# Patient Record
Sex: Female | Born: 1970
Health system: Southern US, Community
[De-identification: ages and names within clinical notes are randomized; demographics above are authoritative.]

## PROBLEM LIST (undated history)

## (undated) DIAGNOSIS — I10 Essential (primary) hypertension: Secondary | ICD-10-CM

## (undated) DIAGNOSIS — I639 Cerebral infarction, unspecified: Secondary | ICD-10-CM

## (undated) HISTORY — PX: TUBAL LIGATION: SHX77

---

## 1998-05-26 ENCOUNTER — Ambulatory Visit (HOSPITAL_COMMUNITY): Admission: RE | Admit: 1998-05-26 | Discharge: 1998-05-26 | Payer: Self-pay | Admitting: *Deleted

## 1999-04-20 ENCOUNTER — Encounter: Admission: RE | Admit: 1999-04-20 | Discharge: 1999-04-20 | Payer: Self-pay | Admitting: Family Medicine

## 1999-04-20 ENCOUNTER — Other Ambulatory Visit: Admission: RE | Admit: 1999-04-20 | Discharge: 1999-04-20 | Payer: Self-pay | Admitting: *Deleted

## 1999-09-05 ENCOUNTER — Inpatient Hospital Stay (HOSPITAL_COMMUNITY): Admission: AD | Admit: 1999-09-05 | Discharge: 1999-09-05 | Payer: Self-pay | Admitting: Obstetrics & Gynecology

## 1999-10-05 ENCOUNTER — Encounter: Admission: RE | Admit: 1999-10-05 | Discharge: 1999-10-05 | Payer: Self-pay | Admitting: Family Medicine

## 1999-10-06 ENCOUNTER — Inpatient Hospital Stay (HOSPITAL_COMMUNITY): Admission: AD | Admit: 1999-10-06 | Discharge: 1999-10-06 | Payer: Self-pay | Admitting: Emergency Medicine

## 1999-10-11 ENCOUNTER — Encounter: Admission: RE | Admit: 1999-10-11 | Discharge: 1999-10-11 | Payer: Self-pay | Admitting: Sports Medicine

## 1999-11-29 ENCOUNTER — Encounter: Admission: RE | Admit: 1999-11-29 | Discharge: 1999-11-29 | Payer: Self-pay | Admitting: Sports Medicine

## 1999-12-21 ENCOUNTER — Ambulatory Visit (HOSPITAL_COMMUNITY): Admission: RE | Admit: 1999-12-21 | Discharge: 1999-12-21 | Payer: Self-pay | Admitting: *Deleted

## 1999-12-27 ENCOUNTER — Encounter: Admission: RE | Admit: 1999-12-27 | Discharge: 1999-12-27 | Payer: Self-pay | Admitting: Sports Medicine

## 2000-01-10 ENCOUNTER — Encounter: Admission: RE | Admit: 2000-01-10 | Discharge: 2000-01-10 | Payer: Self-pay | Admitting: Sports Medicine

## 2000-01-16 ENCOUNTER — Encounter: Admission: RE | Admit: 2000-01-16 | Discharge: 2000-01-16 | Payer: Self-pay | Admitting: Family Medicine

## 2000-01-25 ENCOUNTER — Encounter: Admission: RE | Admit: 2000-01-25 | Discharge: 2000-01-25 | Payer: Self-pay | Admitting: Family Medicine

## 2000-02-10 ENCOUNTER — Encounter: Admission: RE | Admit: 2000-02-10 | Discharge: 2000-02-10 | Payer: Self-pay | Admitting: Family Medicine

## 2000-02-23 ENCOUNTER — Encounter: Admission: RE | Admit: 2000-02-23 | Discharge: 2000-02-23 | Payer: Self-pay | Admitting: Family Medicine

## 2000-03-15 ENCOUNTER — Encounter: Admission: RE | Admit: 2000-03-15 | Discharge: 2000-03-15 | Payer: Self-pay | Admitting: Family Medicine

## 2000-03-21 ENCOUNTER — Inpatient Hospital Stay (HOSPITAL_COMMUNITY): Admission: AD | Admit: 2000-03-21 | Discharge: 2000-03-21 | Payer: Self-pay | Admitting: Obstetrics

## 2000-03-23 ENCOUNTER — Encounter: Admission: RE | Admit: 2000-03-23 | Discharge: 2000-03-23 | Payer: Self-pay | Admitting: Family Medicine

## 2000-03-26 ENCOUNTER — Encounter: Admission: RE | Admit: 2000-03-26 | Discharge: 2000-03-26 | Payer: Self-pay | Admitting: Family Medicine

## 2000-03-31 ENCOUNTER — Inpatient Hospital Stay (HOSPITAL_COMMUNITY): Admission: AD | Admit: 2000-03-31 | Discharge: 2000-03-31 | Payer: Self-pay | Admitting: *Deleted

## 2000-04-02 ENCOUNTER — Encounter (HOSPITAL_COMMUNITY): Admission: AD | Admit: 2000-04-02 | Discharge: 2000-04-06 | Payer: Self-pay | Admitting: Obstetrics

## 2000-04-05 ENCOUNTER — Inpatient Hospital Stay (HOSPITAL_COMMUNITY): Admission: AD | Admit: 2000-04-05 | Discharge: 2000-04-08 | Payer: Self-pay | Admitting: Obstetrics & Gynecology

## 2000-04-05 ENCOUNTER — Encounter: Admission: RE | Admit: 2000-04-05 | Discharge: 2000-04-05 | Payer: Self-pay | Admitting: Family Medicine

## 2000-07-23 ENCOUNTER — Encounter: Admission: RE | Admit: 2000-07-23 | Discharge: 2000-07-23 | Payer: Self-pay | Admitting: Family Medicine

## 2001-03-06 ENCOUNTER — Ambulatory Visit (HOSPITAL_BASED_OUTPATIENT_CLINIC_OR_DEPARTMENT_OTHER): Admission: RE | Admit: 2001-03-06 | Discharge: 2001-03-06 | Payer: Self-pay | Admitting: Ophthalmology

## 2001-12-03 ENCOUNTER — Encounter: Admission: RE | Admit: 2001-12-03 | Discharge: 2001-12-03 | Payer: Self-pay | Admitting: Family Medicine

## 2001-12-06 ENCOUNTER — Encounter: Admission: RE | Admit: 2001-12-06 | Discharge: 2001-12-06 | Payer: Self-pay | Admitting: Family Medicine

## 2002-07-04 ENCOUNTER — Encounter: Admission: RE | Admit: 2002-07-04 | Discharge: 2002-07-04 | Payer: Self-pay | Admitting: Family Medicine

## 2003-02-16 ENCOUNTER — Encounter (INDEPENDENT_AMBULATORY_CARE_PROVIDER_SITE_OTHER): Payer: Self-pay | Admitting: *Deleted

## 2003-02-16 LAB — CONVERTED CEMR LAB

## 2003-03-11 ENCOUNTER — Other Ambulatory Visit: Admission: RE | Admit: 2003-03-11 | Discharge: 2003-03-11 | Payer: Self-pay | Admitting: Family Medicine

## 2003-03-11 ENCOUNTER — Encounter: Admission: RE | Admit: 2003-03-11 | Discharge: 2003-03-11 | Payer: Self-pay | Admitting: Sports Medicine

## 2007-02-14 DIAGNOSIS — L708 Other acne: Secondary | ICD-10-CM | POA: Insufficient documentation

## 2007-02-15 ENCOUNTER — Encounter (INDEPENDENT_AMBULATORY_CARE_PROVIDER_SITE_OTHER): Payer: Self-pay | Admitting: *Deleted

## 2011-11-28 ENCOUNTER — Encounter: Payer: Self-pay | Admitting: Family Medicine

## 2011-12-26 ENCOUNTER — Encounter: Payer: Self-pay | Admitting: Family Medicine

## 2012-02-09 ENCOUNTER — Emergency Department (HOSPITAL_COMMUNITY): Payer: Medicaid Other

## 2012-02-09 ENCOUNTER — Encounter (HOSPITAL_COMMUNITY): Payer: Self-pay | Admitting: *Deleted

## 2012-02-09 ENCOUNTER — Inpatient Hospital Stay (HOSPITAL_COMMUNITY)
Admission: EM | Admit: 2012-02-09 | Discharge: 2012-02-11 | DRG: 512 | Disposition: A | Discharge status: Home / Self Care | Payer: Medicaid Other | Attending: Orthopedic Surgery | Admitting: Orthopedic Surgery

## 2012-02-09 DIAGNOSIS — S52309A Unspecified fracture of shaft of unspecified radius, initial encounter for closed fracture: Principal | ICD-10-CM | POA: Diagnosis present

## 2012-02-09 DIAGNOSIS — E876 Hypokalemia: Secondary | ICD-10-CM

## 2012-02-09 DIAGNOSIS — S52201A Unspecified fracture of shaft of right ulna, initial encounter for closed fracture: Secondary | ICD-10-CM

## 2012-02-09 DIAGNOSIS — S5290XA Unspecified fracture of unspecified forearm, initial encounter for closed fracture: Secondary | ICD-10-CM

## 2012-02-09 DIAGNOSIS — D649 Anemia, unspecified: Secondary | ICD-10-CM

## 2012-02-09 DIAGNOSIS — S52209A Unspecified fracture of shaft of unspecified ulna, initial encounter for closed fracture: Principal | ICD-10-CM

## 2012-02-09 DIAGNOSIS — I10 Essential (primary) hypertension: Secondary | ICD-10-CM | POA: Diagnosis present

## 2012-02-09 DIAGNOSIS — Y92410 Unspecified street and highway as the place of occurrence of the external cause: Secondary | ICD-10-CM

## 2012-02-09 HISTORY — DX: Essential (primary) hypertension: I10

## 2012-02-09 LAB — DIFFERENTIAL
Basophils Absolute: 0 10*3/uL (ref 0.0–0.1)
Eosinophils Relative: 1 % (ref 0–5)
Lymphocytes Relative: 26 % (ref 12–46)
Lymphs Abs: 2.8 10*3/uL (ref 0.7–4.0)
Monocytes Relative: 5 % (ref 3–12)
Neutrophils Relative %: 68 % (ref 43–77)

## 2012-02-09 LAB — BASIC METABOLIC PANEL
CO2: 21 mEq/L (ref 19–32)
Chloride: 101 mEq/L (ref 96–112)
GFR calc Af Amer: >90 mL/min (ref >90)
Potassium: 3.1 mEq/L — ABNORMAL LOW (ref 3.5–5.1)
Sodium: 136 mEq/L (ref 135–145)

## 2012-02-09 LAB — CBC
MCV: 64 fL — ABNORMAL LOW (ref 78.0–100.0)
Platelets: 244 10*3/uL (ref 150–400)
RBC: 4.64 MIL/uL (ref 3.87–5.11)
WBC: 10.7 10*3/uL — ABNORMAL HIGH (ref 4.0–10.5)

## 2012-02-09 MED ORDER — MORPHINE SULFATE 4 MG/ML IJ SOLN
4.0000 mg | Freq: Once | INTRAMUSCULAR | Status: AC
  Administered 2012-02-09: 4 mg via INTRAVENOUS
  Filled 2012-02-09: qty 1
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
## 2012-02-10 ENCOUNTER — Emergency Department (HOSPITAL_COMMUNITY): Payer: Medicaid Other

## 2012-02-10 ENCOUNTER — Encounter (HOSPITAL_COMMUNITY): Payer: Self-pay | Admitting: Anesthesiology

## 2012-02-10 ENCOUNTER — Encounter (HOSPITAL_COMMUNITY): Payer: Self-pay | Admitting: Radiology

## 2012-02-10 ENCOUNTER — Encounter (HOSPITAL_COMMUNITY)
Admission: EM | Disposition: A | Discharge status: Home / Self Care | Payer: Self-pay | Source: Home / Self Care | Attending: Orthopedic Surgery

## 2012-02-10 ENCOUNTER — Emergency Department (HOSPITAL_COMMUNITY): Payer: Medicaid Other | Admitting: Anesthesiology

## 2012-02-10 DIAGNOSIS — S52209A Unspecified fracture of shaft of unspecified ulna, initial encounter for closed fracture: Secondary | ICD-10-CM

## 2012-02-10 HISTORY — PX: ORIF ULNAR FRACTURE: SHX5417

## 2012-02-10 HISTORY — PX: ORIF RADIAL FRACTURE: SHX5113

## 2012-02-10 LAB — ABO/RH: ABO/RH(D): A POS

## 2012-02-10 LAB — TYPE AND SCREEN
ABO/RH(D): A POS
Antibody Screen: NEGATIVE

## 2012-02-10 SURGERY — OPEN REDUCTION INTERNAL FIXATION (ORIF) RADIAL FRACTURE
Anesthesia: General | Site: Arm Lower | Laterality: Right | Wound class: Clean

## 2012-02-10 MED ORDER — LACTATED RINGERS IV SOLN
INTRAVENOUS | Status: DC | PRN
  Administered 2012-02-10 (×2): via INTRAVENOUS

## 2012-02-10 MED ORDER — MORPHINE SULFATE 4 MG/ML IJ SOLN
4.0000 mg | INTRAMUSCULAR | Status: DC | PRN
  Administered 2012-02-10 (×2): 4 mg via INTRAVENOUS
  Filled 2012-02-10 (×2): qty 1

## 2012-02-10 MED ORDER — METOCLOPRAMIDE HCL 10 MG PO TABS: 5.0000 mg | ORAL_TABLET | Freq: Three times a day (TID) | ORAL | Status: DC | PRN

## 2012-02-10 MED ORDER — ONDANSETRON HCL 4 MG/2ML IJ SOLN: 4.0000 mg | Freq: Four times a day (QID) | INTRAMUSCULAR | Status: DC | PRN

## 2012-02-10 MED ORDER — KETOROLAC TROMETHAMINE 30 MG/ML IJ SOLN
30.0000 mg | Freq: Four times a day (QID) | INTRAMUSCULAR | Status: DC
  Administered 2012-02-10 – 2012-02-11 (×3): 30 mg via INTRAVENOUS
  Filled 2012-02-10 (×7): qty 1

## 2012-02-10 MED ORDER — CEFAZOLIN SODIUM 1-5 GM-% IV SOLN
1.0000 g | Freq: Three times a day (TID) | INTRAVENOUS | Status: AC
  Administered 2012-02-10 – 2012-02-11 (×3): 1 g via INTRAVENOUS
  Filled 2012-02-10 (×3): qty 50

## 2012-02-10 MED ORDER — FENTANYL CITRATE 0.05 MG/ML IJ SOLN
INTRAMUSCULAR | Status: DC | PRN
  Administered 2012-02-10: 100 ug via INTRAVENOUS
  Administered 2012-02-10: 5 ug via INTRAVENOUS

## 2012-02-10 MED ORDER — NEOSTIGMINE METHYLSULFATE 1 MG/ML IJ SOLN
INTRAMUSCULAR | Status: DC | PRN
  Administered 2012-02-10: 3 mg via INTRAVENOUS

## 2012-02-10 MED ORDER — CHLORHEXIDINE GLUCONATE 4 % EX LIQD
60.0000 mL | Freq: Once | CUTANEOUS | Status: DC
  Filled 2012-02-10: qty 60

## 2012-02-10 MED ORDER — MORPHINE SULFATE 2 MG/ML IJ SOLN: 0.0500 mg/kg | INTRAMUSCULAR | Status: DC | PRN

## 2012-02-10 MED ORDER — METHOCARBAMOL 500 MG PO TABS
500.0000 mg | ORAL_TABLET | Freq: Four times a day (QID) | ORAL | Status: DC | PRN
  Administered 2012-02-11: 500 mg via ORAL
  Filled 2012-02-10: qty 1

## 2012-02-10 MED ORDER — MIDAZOLAM HCL 5 MG/5ML IJ SOLN
INTRAMUSCULAR | Status: DC | PRN
  Administered 2012-02-10 (×2): 1 mg via INTRAVENOUS

## 2012-02-10 MED ORDER — ONDANSETRON HCL 4 MG/2ML IJ SOLN
4.0000 mg | Freq: Four times a day (QID) | INTRAMUSCULAR | Status: DC | PRN
  Administered 2012-02-10: 4 mg via INTRAVENOUS
  Filled 2012-02-10: qty 2

## 2012-02-10 MED ORDER — ROCURONIUM BROMIDE 100 MG/10ML IV SOLN
INTRAVENOUS | Status: DC | PRN
  Administered 2012-02-10: 50 mg via INTRAVENOUS

## 2012-02-10 MED ORDER — MEPERIDINE HCL 25 MG/ML IJ SOLN: 6.2500 mg | INTRAMUSCULAR | Status: DC | PRN

## 2012-02-10 MED ORDER — MORPHINE SULFATE 4 MG/ML IJ SOLN
4.0000 mg | Freq: Once | INTRAMUSCULAR | Status: AC
  Administered 2012-02-10: 4 mg via INTRAVENOUS
  Filled 2012-02-10: qty 1

## 2012-02-10 MED ORDER — METOCLOPRAMIDE HCL 5 MG/ML IJ SOLN
5.0000 mg | Freq: Three times a day (TID) | INTRAMUSCULAR | Status: DC | PRN
  Filled 2012-02-10: qty 2

## 2012-02-10 MED ORDER — ONDANSETRON HCL 4 MG/2ML IJ SOLN: 4.0000 mg | Freq: Once | INTRAMUSCULAR | Status: DC | PRN

## 2012-02-10 MED ORDER — MORPHINE SULFATE 4 MG/ML IJ SOLN
4.0000 mg | INTRAMUSCULAR | Status: DC | PRN
  Administered 2012-02-10: 4 mg via INTRAVENOUS

## 2012-02-10 MED ORDER — CEFAZOLIN SODIUM-DEXTROSE 2-3 GM-% IV SOLR: 2.0000 g | INTRAVENOUS | Status: DC

## 2012-02-10 MED ORDER — IOHEXOL 300 MG/ML  SOLN
100.0000 mL | Freq: Once | INTRAMUSCULAR | Status: AC | PRN
  Administered 2012-02-10: 100 mL via INTRAVENOUS

## 2012-02-10 MED ORDER — HYDROMORPHONE HCL PF 1 MG/ML IJ SOLN
1.0000 mg | Freq: Once | INTRAMUSCULAR | Status: AC
  Administered 2012-02-10: 1 mg via INTRAVENOUS
  Filled 2012-02-10: qty 1

## 2012-02-10 MED ORDER — HYDROMORPHONE HCL PF 1 MG/ML IJ SOLN: 0.2500 mg | INTRAMUSCULAR | Status: DC | PRN

## 2012-02-10 MED ORDER — ONDANSETRON HCL 4 MG PO TABS: 4.0000 mg | ORAL_TABLET | Freq: Four times a day (QID) | ORAL | Status: DC | PRN

## 2012-02-10 MED ORDER — PHENYLEPHRINE HCL 10 MG/ML IJ SOLN
INTRAMUSCULAR | Status: DC | PRN
  Administered 2012-02-10 (×2): 80 ug via INTRAVENOUS

## 2012-02-10 MED ORDER — PROPOFOL 10 MG/ML IV EMUL
INTRAVENOUS | Status: DC | PRN
  Administered 2012-02-10: 120 mg via INTRAVENOUS

## 2012-02-10 MED ORDER — 0.9 % SODIUM CHLORIDE (POUR BTL) OPTIME
TOPICAL | Status: DC | PRN
  Administered 2012-02-10: 1000 mL

## 2012-02-10 MED ORDER — ONDANSETRON HCL 4 MG/2ML IJ SOLN
INTRAMUSCULAR | Status: DC | PRN
  Administered 2012-02-10: 4 mg via INTRAVENOUS

## 2012-02-10 MED ORDER — CEFAZOLIN SODIUM 1-5 GM-% IV SOLN
INTRAVENOUS | Status: DC | PRN
  Administered 2012-02-10: 2 g via INTRAVENOUS

## 2012-02-10 MED ORDER — KCL IN DEXTROSE-NACL 20-5-0.45 MEQ/L-%-% IV SOLN
INTRAVENOUS | Status: AC
  Administered 2012-02-10: 02:00:00 via INTRAVENOUS
  Filled 2012-02-10: qty 1000

## 2012-02-10 MED ORDER — MORPHINE SULFATE 4 MG/ML IJ SOLN
4.0000 mg | INTRAMUSCULAR | Status: DC | PRN
  Filled 2012-02-10: qty 1

## 2012-02-10 MED ORDER — LIDOCAINE HCL (CARDIAC) 20 MG/ML IV SOLN
INTRAVENOUS | Status: DC | PRN
  Administered 2012-02-10: 60 mg via INTRAVENOUS

## 2012-02-10 MED ORDER — BUPIVACAINE-EPINEPHRINE PF 0.5-1:200000 % IJ SOLN
INTRAMUSCULAR | Status: DC | PRN
  Administered 2012-02-10: 30 mL

## 2012-02-10 MED ORDER — GLYCOPYRROLATE 0.2 MG/ML IJ SOLN
INTRAMUSCULAR | Status: DC | PRN
  Administered 2012-02-10: .4 mg via INTRAVENOUS

## 2012-02-10 MED ORDER — METHOCARBAMOL 100 MG/ML IJ SOLN
500.0000 mg | Freq: Four times a day (QID) | INTRAMUSCULAR | Status: DC | PRN
  Filled 2012-02-10: qty 5

## 2012-02-10 MED ORDER — SUCCINYLCHOLINE CHLORIDE 20 MG/ML IJ SOLN
INTRAMUSCULAR | Status: DC | PRN
  Administered 2012-02-10: 100 mg via INTRAVENOUS

## 2012-02-10 MED ORDER — HYDROMORPHONE HCL PF 1 MG/ML IJ SOLN: 0.5000 mg | INTRAMUSCULAR | Status: DC | PRN

## 2012-02-10 MED ORDER — OXYCODONE HCL 5 MG PO TABS
5.0000 mg | ORAL_TABLET | ORAL | Status: DC | PRN
  Administered 2012-02-11: 10 mg via ORAL
  Filled 2012-02-10: qty 2

## 2012-02-10 SURGICAL SUPPLY — 67 items
BANDAGE ACE 4 STERILE (GAUZE/BANDAGES/DRESSINGS) ×2 IMPLANT
BANDAGE ELASTIC 3 VELCRO ST LF (GAUZE/BANDAGES/DRESSINGS) ×2 IMPLANT
BANDAGE ELASTIC 4 VELCRO ST LF (GAUZE/BANDAGES/DRESSINGS) ×2 IMPLANT
BANDAGE GAUZE ELAST BULKY 4 IN (GAUZE/BANDAGES/DRESSINGS) ×2 IMPLANT
BIT DRILL 2.8X5 QR DISP (BIT) ×2 IMPLANT
BLADE SURG 10 STRL SS (BLADE) ×4 IMPLANT
BNDG ESMARK 4X9 LF (GAUZE/BANDAGES/DRESSINGS) ×2 IMPLANT
CLOTH BEACON ORANGE TIMEOUT ST (SAFETY) ×2 IMPLANT
CORDS BIPOLAR (ELECTRODE) ×2 IMPLANT
COVER SURGICAL LIGHT HANDLE (MISCELLANEOUS) ×2 IMPLANT
CUFF TOURNIQUET SINGLE 18IN (TOURNIQUET CUFF) ×2 IMPLANT
CUFF TOURNIQUET SINGLE 24IN (TOURNIQUET CUFF) IMPLANT
DRAIN TLS ROUND 10FR (DRAIN) IMPLANT
DRAPE INCISE IOBAN 66X45 STRL (DRAPES) ×2 IMPLANT
DRAPE OEC MINIVIEW 54X84 (DRAPES) ×2 IMPLANT
DRAPE U-SHAPE 47X51 STRL (DRAPES) ×2 IMPLANT
DRSG EMULSION OIL 3X3 NADH (GAUZE/BANDAGES/DRESSINGS) ×2 IMPLANT
DRSG PAD ABDOMINAL 8X10 ST (GAUZE/BANDAGES/DRESSINGS) ×8 IMPLANT
DURAPREP 26ML APPLICATOR (WOUND CARE) ×2 IMPLANT
ELECT REM PT RETURN 9FT ADLT (ELECTROSURGICAL) ×2
ELECTRODE REM PT RTRN 9FT ADLT (ELECTROSURGICAL) ×1 IMPLANT
FACESHIELD LNG OPTICON STERILE (SAFETY) ×2 IMPLANT
GAUZE SPONGE 4X4 12PLY STRL LF (GAUZE/BANDAGES/DRESSINGS) ×2 IMPLANT
GAUZE XEROFORM 1X8 LF (GAUZE/BANDAGES/DRESSINGS) ×2 IMPLANT
GLOVE BIO SURGEON ST LM GN SZ9 (GLOVE) ×4 IMPLANT
GLOVE BIOGEL PI IND STRL 8 (GLOVE) ×1 IMPLANT
GLOVE BIOGEL PI INDICATOR 8 (GLOVE) ×1
GLOVE SURG ORTHO 8.0 STRL STRW (GLOVE) ×2 IMPLANT
GOWN PREVENTION PLUS LG XLONG (DISPOSABLE) IMPLANT
GOWN PREVENTION PLUS XLARGE (GOWN DISPOSABLE) ×2 IMPLANT
GOWN PREVENTION PLUS XXLARGE (GOWN DISPOSABLE) ×2 IMPLANT
GOWN STRL NON-REIN LRG LVL3 (GOWN DISPOSABLE) ×4 IMPLANT
GUIDEWIRE ORTHO .059X5 (WIRE) ×2 IMPLANT
KIT BASIN OR (CUSTOM PROCEDURE TRAY) ×2 IMPLANT
KIT ROOM TURNOVER OR (KITS) ×2 IMPLANT
MANIFOLD NEPTUNE II (INSTRUMENTS) ×2 IMPLANT
NEEDLE 22X1 1/2 (OR ONLY) (NEEDLE) ×2 IMPLANT
NS IRRIG 1000ML POUR BTL (IV SOLUTION) ×4 IMPLANT
PACK ORTHO EXTREMITY (CUSTOM PROCEDURE TRAY) ×2 IMPLANT
PAD ARMBOARD 7.5X6 YLW CONV (MISCELLANEOUS) ×4 IMPLANT
PAD CAST 3X4 CTTN HI CHSV (CAST SUPPLIES) ×1 IMPLANT
PAD CAST 4YDX4 CTTN HI CHSV (CAST SUPPLIES) ×1 IMPLANT
PADDING CAST COTTON 3X4 STRL (CAST SUPPLIES) ×1
PADDING CAST COTTON 4X4 STRL (CAST SUPPLIES) ×1
PADDING WEBRIL 3 STERILE (GAUZE/BANDAGES/DRESSINGS) ×2 IMPLANT
PADDING WEBRIL 4 STERILE (GAUZE/BANDAGES/DRESSINGS) ×2 IMPLANT
PLATE ULNA MIDSHAFT 8 HOLE (Plate) ×2 IMPLANT
PLATE VOLAR RADIS MIDSHAFT 6 H (Plate) ×2 IMPLANT
SCREW 3.5MMX12.0MM (Screw) ×22 IMPLANT
SCREW 3.5MMX14.0MM (Screw) ×2 IMPLANT
SCREW CORT 3.5X14 (Screw) ×2 IMPLANT
SPONGE GAUZE 4X4 12PLY (GAUZE/BANDAGES/DRESSINGS) ×4 IMPLANT
SPONGE LAP 4X18 X RAY DECT (DISPOSABLE) ×4 IMPLANT
STAPLER VISISTAT 35W (STAPLE) IMPLANT
STRIP CLOSURE SKIN 1/2X4 (GAUZE/BANDAGES/DRESSINGS) IMPLANT
SUCTION FRAZIER TIP 10 FR DISP (SUCTIONS) ×2 IMPLANT
SUT ETHILON 3 0 PS 1 (SUTURE) IMPLANT
SUT PROLENE 3 0 PS 1 (SUTURE) ×4 IMPLANT
SUT VIC AB 2-0 CTB1 (SUTURE) ×6 IMPLANT
SUT VIC AB 3-0 X1 27 (SUTURE) IMPLANT
SYR CONTROL 10ML LL (SYRINGE) ×2 IMPLANT
SYSTEM CHEST DRAIN TLS 7FR (DRAIN) IMPLANT
TOWEL OR 17X24 6PK STRL BLUE (TOWEL DISPOSABLE) ×2 IMPLANT
TOWEL OR 17X26 10 PK STRL BLUE (TOWEL DISPOSABLE) ×2 IMPLANT
TUBE CONNECTING 12X1/4 (SUCTIONS) ×2 IMPLANT
WATER STERILE IRR 1000ML POUR (IV SOLUTION) ×2 IMPLANT
YANKAUER SUCT BULB TIP NO VENT (SUCTIONS) IMPLANT

## 2012-02-10 NOTE — Anesthesia Preprocedure Evaluation (Addendum)
Anesthesia Evaluation  Patient identified by MRN, date of birth, ID band Patient awake    Reviewed: Allergy & Precautions, H&P , NPO status , Patient's Chart, lab work & pertinent test results  Airway Mallampati: II      Dental  (+) Teeth Intact   Pulmonary  clear to auscultation        Cardiovascular hypertension,     Neuro/Psych    GI/Hepatic   Endo/Other    Renal/GU      Musculoskeletal   Abdominal   Peds  Hematology   Anesthesia Other Findings   Reproductive/Obstetrics                          Anesthesia Physical Anesthesia Plan  ASA: II  Anesthesia Plan: General   Post-op Pain Management:    Induction: Intravenous, Rapid sequence and Cricoid pressure planned  Airway Management Planned: Oral ETT  Additional Equipment:   Intra-op Plan:   Post-operative Plan: Extubation in OR  Informed Consent: I have reviewed the patients History and Physical, chart, labs and discussed the procedure including the risks, benefits and alternatives for the proposed anesthesia with the patient or authorized representative who has indicated his/her understanding and acceptance.   Dental advisory given  Plan Discussed with: CRNA and Surgeon  Anesthesia Plan Comments:        Anesthesia Quick Evaluation

## 2012-02-10 NOTE — Brief Op Note (Signed)
02/09/2012 - 02/10/2012  1:14 PM  PATIENT:  Sarah Myers  41 y.o. female  PRE-OPERATIVE DIAGNOSIS:  fracture  POST-OPERATIVE DIAGNOSIS:  Right both bone forearm fracture  PROCEDURE:  Procedure(s): OPEN REDUCTION INTERNAL FIXATION (ORIF) RADIAL FRACTURE OPEN REDUCTION INTERNAL FIXATION (ORIF) ULNAR FRACTURE  SURGEON:  Surgeon(s): Cammy Copa, MD Virgina Norfolk, MD  ASSISTANT:*  ANESTHESIA:   regional and general  EBL: 10 ml    Total I/O In: 1000 [I.V.:1000] Out: -   BLOOD ADMINISTERED: none  DRAINS: none   LOCAL MEDICATIONS USED:  none  SPECIMEN:  No Specimen  COUNTS:  YES  TOURNIQUET:   Total Tourniquet Time Documented: Upper Arm (Right) - 74 minutes  DICTATION: .Other Dictation: Dictation Number [id number]  PLAN OF CARE: Discharge to home after PACU  PATIENT DISPOSITION:  PACU - hemodynamically stable

## 2012-02-10 NOTE — Progress Notes (Signed)
Orthopedic Tech Progress Note Patient Details:  Sarah Myers [birth year] [id number]       [name] 02/10/2012, 2:21 AM
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
## 2012-02-10 NOTE — Progress Notes (Signed)
Orthopedic Tech Progress Note Patient Details:  Sarah Myers [birth year] [id number]       [name] 02/10/2012, 1:53 AM

## 2012-02-10 NOTE — ED Notes (Signed)
Dr August Saucer called  Will be in this am to see patient and schedule surgery.
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
## 2012-02-10 NOTE — Anesthesia Procedure Notes (Addendum)
Anesthesia Regional Block:  Supraclavicular block  Pre-Anesthetic Checklist: ,, timeout performed, Correct Patient, Correct Site, Correct Laterality, Correct Procedure, Correct Position, site marked, Risks and benefits discussed,  Surgical consent,  Pre-op evaluation,  At surgeon's request and post-op pain management  Laterality: Right  Prep: chloraprep       Needles:  Injection technique: Single-shot  Needle Type: Echogenic Stimulator Needle     Needle Length: 5cm 5 cm     Additional Needles:  Procedures: ultrasound guided and nerve stimulator Supraclavicular block  Nerve Stimulator or Paresthesia:  Response: 0.4 mA,   Additional Responses:   Narrative:  Start time: 02/10/2012 10:10 AM End time: 02/10/2012 10:20 AM Injection made incrementally with aspirations every 5 mL.  Performed by: Personally  Anesthesiologist: Lonia Skinner MD  Additional Notes: Monitors applied. Patient sedated. Sterile prep and drape,hand hygiene and sterile gloves were used. Relevant anatomy identified.Needle position confirmed.Local anesthetic injected incrementally after negative aspiration. Local anesthetic spread visualized around nerve(s). Vascular puncture avoided. No complications. Image printed for medical record.The patient tolerated the procedure well.        Procedure Name: Intubation Date/Time: 02/10/2012 10:53 AM Performed by: Caryn Bee Pre-anesthesia Checklist: Patient identified, Emergency Drugs available, Suction available, Patient being monitored and Timeout performed Patient Re-evaluated:Patient Re-evaluated prior to inductionOxygen Delivery Method: Circle system utilized Preoxygenation: Pre-oxygenation with 100% oxygen Intubation Type: IV induction Ventilation: Mask ventilation without difficulty Laryngoscope Size: Mac and 4 Grade View: Grade I Tube size: 7.0 mm Number of attempts: 1 Airway Equipment and Method: Stylet Placement Confirmation: ETT inserted  through vocal cords under direct vision,  positive ETCO2 and breath sounds checked- equal and bilateral Secured at: 23 cm Tube secured with: Tape Dental Injury: Teeth and Oropharynx as per pre-operative assessment

## 2012-02-10 NOTE — Transfer of Care (Signed)
Immediate Anesthesia Transfer of Care Note  Patient: Sarah Myers  Procedure(s) Performed: Procedure(s) (LRB): OPEN REDUCTION INTERNAL FIXATION (ORIF) RADIAL FRACTURE (Right) OPEN REDUCTION INTERNAL FIXATION (ORIF) ULNAR FRACTURE (Right)  Patient Location: PACU  Anesthesia Type: General  Level of Consciousness: awake, alert  and oriented  Airway & Oxygen Therapy: Patient Spontanous Breathing and Patient connected to nasal cannula oxygen  Post-op Assessment: Report given to PACU RN and Post -op Vital signs reviewed and stable  Post vital signs: Reviewed and stable  Complications: No apparent anesthesia complications

## 2012-02-10 NOTE — Anesthesia Postprocedure Evaluation (Signed)
Anesthesia Post Note  Patient: Sarah Myers  Procedure(s) Performed: Procedure(s) (LRB): OPEN REDUCTION INTERNAL FIXATION (ORIF) RADIAL FRACTURE (Right) OPEN REDUCTION INTERNAL FIXATION (ORIF) ULNAR FRACTURE (Right)  Anesthesia type: general  Patient location: PACU  Post pain: Pain level controlled  Post assessment: Patient's Cardiovascular Status Stable  Last Vitals:  Filed Vitals:   02/10/12 1424  BP:   Pulse: 86  Temp:   Resp: 16    Post vital signs: Reviewed and stable  Level of consciousness: sedated  Complications: No apparent anesthesia complications

## 2012-02-10 NOTE — Preoperative (Signed)
Beta Blockers   Reason not to administer Beta Blockers:Not Applicable 

## 2012-02-10 NOTE — ED Notes (Signed)
Report given to [facility], CRNA in [place].

## 2012-02-10 NOTE — Progress Notes (Signed)
Orthopedic Tech Progress Note Patient Details:  Sarah Myers [birth year] [id number]       [name] 02/10/2012, 2:17 AM
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
## 2012-02-10 NOTE — Progress Notes (Signed)
Orthopedic Tech Progress Note Patient Details:  Sarah Myers 1971-02-28 [id number]       [name] 02/10/2012, 2:18 AM

## 2012-02-11 MED ORDER — METHOCARBAMOL 500 MG PO TABS: 500.0000 mg | ORAL_TABLET | Freq: Four times a day (QID) | ORAL | Status: AC | PRN

## 2012-02-11 MED ORDER — OXYCODONE HCL 5 MG PO TABS: 5.0000 mg | ORAL_TABLET | ORAL | Status: AC | PRN

## 2012-02-11 NOTE — Progress Notes (Signed)
Pt stable vss Motor sensory hand ok right Dc today

## 2012-02-11 NOTE — Plan of Care (Signed)
Problem: Discharge Progression Outcomes Goal: Complications resolved/controlled Outcome: Adequate for Discharge anemic

## 2012-02-11 NOTE — ED Provider Notes (Signed)
Medical screening examination/treatment/procedure(s) were performed by non-physician practitioner and as supervising physician I was immediately available for consultation/collaboration.   Geoffery Lyons, MD 02/11/12 [phone]
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
## 2012-02-13 ENCOUNTER — Encounter (HOSPITAL_COMMUNITY): Payer: Self-pay | Admitting: Orthopedic Surgery
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
## 2014-05-29 ENCOUNTER — Other Ambulatory Visit: Payer: Self-pay | Admitting: Orthopedic Surgery

## 2014-05-29 DIAGNOSIS — R52 Pain, unspecified: Secondary | ICD-10-CM

## 2014-05-29 DIAGNOSIS — R531 Weakness: Secondary | ICD-10-CM

## 2014-06-13 ENCOUNTER — Other Ambulatory Visit: Payer: Medicaid Other

## 2014-06-15 ENCOUNTER — Ambulatory Visit: Payer: Medicaid Other | Admitting: Family Medicine

## 2014-06-24 ENCOUNTER — Other Ambulatory Visit: Payer: Medicaid Other

## 2014-12-23 ENCOUNTER — Ambulatory Visit (HOSPITAL_COMMUNITY)
Admission: RE | Admit: 2014-12-23 | Discharge: 2014-12-23 | Disposition: A | Discharge status: Home / Self Care | Payer: Medicaid Other | Source: Ambulatory Visit | Attending: Cardiology | Admitting: Cardiology

## 2014-12-23 ENCOUNTER — Ambulatory Visit: Payer: Medicaid Other | Attending: Internal Medicine | Admitting: Internal Medicine

## 2014-12-23 ENCOUNTER — Encounter: Payer: Self-pay | Admitting: Internal Medicine

## 2014-12-23 ENCOUNTER — Other Ambulatory Visit: Payer: Self-pay

## 2014-12-23 VITALS — BP 150/98 | HR 72 | Temp 97.8°F | Resp 16 | Ht 63.0 in | Wt 141.0 lb

## 2014-12-23 DIAGNOSIS — R200 Anesthesia of skin: Secondary | ICD-10-CM | POA: Diagnosis not present

## 2014-12-23 DIAGNOSIS — I10 Essential (primary) hypertension: Secondary | ICD-10-CM | POA: Insufficient documentation

## 2014-12-23 DIAGNOSIS — R51 Headache: Secondary | ICD-10-CM | POA: Diagnosis not present

## 2014-12-23 DIAGNOSIS — H538 Other visual disturbances: Secondary | ICD-10-CM | POA: Diagnosis not present

## 2014-12-23 DIAGNOSIS — R079 Chest pain, unspecified: Secondary | ICD-10-CM | POA: Diagnosis not present

## 2014-12-23 MED ORDER — CLONIDINE HCL 0.1 MG PO TABS: 0.2000 mg | ORAL_TABLET | Freq: Once | ORAL | Status: DC

## 2014-12-23 MED ORDER — LOSARTAN POTASSIUM 50 MG PO TABS: 50.0000 mg | ORAL_TABLET | Freq: Every day | ORAL | Status: DC

## 2014-12-23 NOTE — Patient Instructions (Signed)

## 2014-12-23 NOTE — Progress Notes (Signed)
Patient ID: Sarah Myers, female   DOB: 1971/03/04, 44 y.o.   MRN: [id number]  YDX:[id number]  [id number]:[id number]  DOB - 15-Mar-1971  CC:  Chief Complaint  Patient presents with  . Follow-up       HPI: Sarah Myers is a 44 y.o. female here today to establish medical care.  Patient has a past medical history of hypertension.  She states that she has been without medication since 2011 and is unsure of the medication she was on at the time.  She reports that her husband passed away 6 months ago and she has noticed more headaches, chest pain, blurred vision, numbness in upper extremities since his death.   She has had a dry cough for the past one month.  No mucous production.  She feels a choking sensation but nothing is in her throat.  No symptoms of fever, chills, itchy eyes, watery eyes, sore throat, ear pain.     No Known Allergies Past Medical History  Diagnosis Date  . Hypertension    No current outpatient prescriptions on file prior to visit.   No current facility-administered medications on file prior to visit.   Family History  Problem Relation Age of Onset  . Diabetes Mother   . Diabetes Maternal Aunt   . Stroke Maternal Uncle   . Diabetes Maternal Grandmother   . Heart disease Maternal Grandmother   . Diabetes Maternal Grandfather   . Heart disease Maternal Grandfather    History   Social History  . Marital Status: Married    Spouse Name: N/A    Number of Children: N/A  . Years of Education: N/A   Occupational History  . Not on file.   Social History Main Topics  . Smoking status: Never Smoker   . Smokeless tobacco: Not on file  . Alcohol Use: 0.0 oz/week    0 Not specified per week  . Drug Use: No  . Sexual Activity: No   Other Topics Concern  . Not on file   Social History Narrative    Review of Systems  Eyes: Positive for blurred vision.  Respiratory: Positive for cough. Negative for sputum production and shortness of breath.     Cardiovascular: Positive for chest pain and leg swelling. Negative for palpitations.  Gastrointestinal: Negative.   Neurological: Positive for dizziness, tingling and headaches.  Psychiatric/Behavioral:       Stressed      Objective:   Filed Vitals:   12/23/14 1111  BP: 180/120  Pulse: 74  Temp: 97.8 F (36.6 C)  Resp: 16    Physical Exam  Constitutional: She is oriented to person, place, and time.  Cardiovascular: Normal rate, regular rhythm and normal heart sounds.   Pulmonary/Chest: Effort normal and breath sounds normal.  Neurological: She is alert and oriented to person, place, and time.  Skin: Skin is warm and dry.  Psychiatric: She has a normal mood and affect.     Lab Results  Component Value Date   WBC 10.7* 02/09/2012   HGB 8.6* 02/09/2012   HCT 29.7* 02/09/2012   MCV 64.0* 02/09/2012   PLT 244 02/09/2012   Lab Results  Component Value Date   CREATININE 0.63 02/09/2012   BUN 10 02/09/2012   NA 136 02/09/2012   K 3.1* 02/09/2012   CL 101 02/09/2012   CO2 21 02/09/2012    No results found for: HGBA1C Lipid Panel  No results found for: CHOL, TRIG, HDL, CHOLHDL, VLDL, LDLCALC  Assessment and plan:   Sarah Myers was seen today for follow-up.  Diagnoses and associated orders for this visit:  Malignant hypertension - Begin losartan (COZAAR) 50 MG tablet; Take 1 tablet (50 mg total) by mouth daily.  - cloNIDine (CATAPRES) tablet 0.2 mg; Take 2 tablets (0.2 mg total) by mouth once. Will recheck pressure at next visit to assess for any medication changes.  Return for 1-2 week physical/pap. Will get labs on next visit   Holland Commons, NP-C [facility] [phone] 12/23/2014, 11:57 AM

## 2014-12-23 NOTE — Progress Notes (Signed)
Pt is here to establish care. Pt has a history of HTN. Pt reports having an occasional dry cough. Pt has occasional chest pain.

## 2014-12-30 ENCOUNTER — Encounter: Payer: Medicaid Other | Admitting: Internal Medicine

## 2015-09-20 ENCOUNTER — Encounter (HOSPITAL_COMMUNITY): Payer: Self-pay | Admitting: Emergency Medicine

## 2015-09-20 ENCOUNTER — Emergency Department (HOSPITAL_COMMUNITY): Payer: Medicaid Other

## 2015-09-20 ENCOUNTER — Emergency Department (HOSPITAL_COMMUNITY)
Admission: EM | Admit: 2015-09-20 | Discharge: 2015-09-20 | Disposition: A | Discharge status: Home / Self Care | Payer: Medicaid Other | Attending: Emergency Medicine | Admitting: Emergency Medicine

## 2015-09-20 DIAGNOSIS — Z3202 Encounter for pregnancy test, result negative: Secondary | ICD-10-CM | POA: Diagnosis not present

## 2015-09-20 DIAGNOSIS — D638 Anemia in other chronic diseases classified elsewhere: Secondary | ICD-10-CM | POA: Insufficient documentation

## 2015-09-20 DIAGNOSIS — I159 Secondary hypertension, unspecified: Secondary | ICD-10-CM | POA: Insufficient documentation

## 2015-09-20 DIAGNOSIS — Z791 Long term (current) use of non-steroidal anti-inflammatories (NSAID): Secondary | ICD-10-CM | POA: Diagnosis not present

## 2015-09-20 DIAGNOSIS — R42 Dizziness and giddiness: Secondary | ICD-10-CM | POA: Diagnosis not present

## 2015-09-20 DIAGNOSIS — E876 Hypokalemia: Secondary | ICD-10-CM | POA: Diagnosis not present

## 2015-09-20 DIAGNOSIS — D649 Anemia, unspecified: Secondary | ICD-10-CM

## 2015-09-20 DIAGNOSIS — I10 Essential (primary) hypertension: Secondary | ICD-10-CM

## 2015-09-20 LAB — BASIC METABOLIC PANEL
ANION GAP: 8 (ref 5–15)
BUN: 10 mg/dL (ref 6–20)
CALCIUM: 9.4 mg/dL (ref 8.9–10.3)
CHLORIDE: 106 mmol/L (ref 101–111)
CO2: 24 mmol/L (ref 22–32)
Creatinine, Ser: 0.69 mg/dL (ref 0.44–1.00)
GFR calc Af Amer: >60 mL/min (ref >60)
GFR calc non Af Amer: >60 mL/min (ref >60)
GLUCOSE: 100 mg/dL — AB (ref 65–99)
Potassium: 3 mmol/L — ABNORMAL LOW (ref 3.5–5.1)
Sodium: 138 mmol/L (ref 135–145)

## 2015-09-20 LAB — CBC
HCT: 28.6 % — ABNORMAL LOW (ref 36.0–46.0)
Hemoglobin: 7.8 g/dL — ABNORMAL LOW (ref 12.0–15.0)
MCH: 16.7 pg — AB (ref 26.0–34.0)
MCHC: 27.3 g/dL — ABNORMAL LOW (ref 30.0–36.0)
MCV: 61.2 fL — AB (ref 78.0–100.0)
PLATELETS: 248 10*3/uL (ref 150–400)
RBC: 4.67 MIL/uL (ref 3.87–5.11)
RDW: 19.8 % — ABNORMAL HIGH (ref 11.5–15.5)
WBC: 7.5 10*3/uL (ref 4.0–10.5)

## 2015-09-20 LAB — URINALYSIS, ROUTINE W REFLEX MICROSCOPIC
Bilirubin Urine: NEGATIVE
Glucose, UA: NEGATIVE mg/dL
Hgb urine dipstick: NEGATIVE
KETONES UR: 40 mg/dL — AB
Nitrite: NEGATIVE
PROTEIN: 30 mg/dL — AB
Specific Gravity, Urine: 1.011 (ref 1.005–1.030)
UROBILINOGEN UA: 1 mg/dL (ref 0.0–1.0)
pH: 7 (ref 5.0–8.0)

## 2015-09-20 LAB — URINE MICROSCOPIC-ADD ON

## 2015-09-20 LAB — I-STAT BETA HCG BLOOD, ED (MC, WL, AP ONLY): I-stat hCG, quantitative: <5 m[IU]/mL (ref <5)

## 2015-09-20 MED ORDER — FERROUS SULFATE 325 (65 FE) MG PO TABS: 325.0000 mg | ORAL_TABLET | Freq: Every day | ORAL | Status: DC

## 2015-09-20 MED ORDER — LOSARTAN POTASSIUM 50 MG PO TABS: 50.0000 mg | ORAL_TABLET | Freq: Every day | ORAL | Status: DC

## 2015-09-20 MED ORDER — ONDANSETRON HCL 4 MG/2ML IJ SOLN
4.0000 mg | Freq: Once | INTRAMUSCULAR | Status: AC
  Administered 2015-09-20: 4 mg via INTRAVENOUS
  Filled 2015-09-20: qty 2

## 2015-09-20 MED ORDER — POTASSIUM CHLORIDE ER 10 MEQ PO TBCR: 10.0000 meq | EXTENDED_RELEASE_TABLET | Freq: Every day | ORAL | Status: DC

## 2015-09-20 MED ORDER — MECLIZINE HCL 25 MG PO TABS: 25.0000 mg | ORAL_TABLET | Freq: Three times a day (TID) | ORAL | Status: DC | PRN

## 2015-09-20 MED ORDER — DIAZEPAM 5 MG PO TABS
5.0000 mg | ORAL_TABLET | Freq: Once | ORAL | Status: AC
  Administered 2015-09-20: 5 mg via ORAL
  Filled 2015-09-20: qty 1

## 2015-09-20 MED ORDER — SODIUM CHLORIDE 0.9 % IV BOLUS (SEPSIS)
1000.0000 mL | Freq: Once | INTRAVENOUS | Status: AC
  Administered 2015-09-20: 1000 mL via INTRAVENOUS

## 2015-09-20 MED ORDER — MECLIZINE HCL 25 MG PO TABS
25.0000 mg | ORAL_TABLET | Freq: Once | ORAL | Status: AC
  Administered 2015-09-20: 25 mg via ORAL
  Filled 2015-09-20: qty 1

## 2015-09-20 NOTE — ED Notes (Signed)
Patient transported to CT 

## 2015-09-20 NOTE — ED Provider Notes (Signed)
CSN: [id number]     Arrival date & time 09/20/15  1045 History   First MD Initiated Contact with Patient 09/20/15 1110     Chief Complaint  Patient presents with  . Dizziness      HPI Patient presents to the emergency department she felt the room spinning when she woke this morning.  She had nausea vomiting 3 episodes this morning.  No hematemesis.  She has a history of hypertension but has not been taking her medications as prescribed.  No fevers or chills.  No chest pain or shortness breath.  No other complaints.   Past Medical History  Diagnosis Date  . Hypertension    Past Surgical History  Procedure Laterality Date  . Cesarean section    . Tubal ligation    . Orif radial fracture  02/10/2012    Procedure: OPEN REDUCTION INTERNAL FIXATION (ORIF) RADIAL FRACTURE;  Surgeon: Cammy Copa, MD;  Location: [facility];  Service: Orthopedics;  Laterality: Right;  . Orif ulnar fracture  02/10/2012    Procedure: OPEN REDUCTION INTERNAL FIXATION (ORIF) ULNAR FRACTURE;  Surgeon: Cammy Copa, MD;  Location: [facility] OR;  Service: Orthopedics;  Laterality: Right;   Family History  Problem Relation Age of Onset  . Diabetes Mother   . Diabetes Maternal Aunt   . Stroke Maternal Uncle   . Diabetes Maternal Grandmother   . Heart disease Maternal Grandmother   . Diabetes Maternal Grandfather   . Heart disease Maternal Grandfather    Social History  Substance Use Topics  . Smoking status: Never Smoker   . Smokeless tobacco: None  . Alcohol Use: 0.0 oz/week    0 Standard drinks or equivalent per week   OB History    No data available     Review of Systems  All other systems reviewed and are negative.     Allergies  Review of patient's allergies indicates no known allergies.  Home Medications   Prior to Admission medications   Medication Sig Start Date End Date Taking? Authorizing Provider  naproxen sodium (ANAPROX) 220 MG tablet Take 220 mg by mouth 2 (two) times daily with  a meal.   Yes Historical Provider, MD  losartan (COZAAR) 50 MG tablet Take 1 tablet (50 mg total) by mouth daily. Patient not taking: Reported on 09/20/2015 12/23/14   Ambrose Finland, NP   BP 156/81 mmHg  Pulse 72  Temp(Src) 98.5 F (36.9 C) (Oral)  Resp 16  Ht 5\' 3"  (1.6 m)  Wt 135 lb (61.236 kg)  BMI 23.92 kg/m2  SpO2 98%  LMP 09/16/2015 Physical Exam  Constitutional: She is oriented to person, place, and time. She appears well-developed and well-nourished. No distress.  HENT:  Head: Normocephalic and atraumatic.  Eyes: EOM are normal.  Neck: Normal range of motion.  Cardiovascular: Normal rate, regular rhythm and normal heart sounds.   Pulmonary/Chest: Effort normal and breath sounds normal.  Abdominal: Soft. She exhibits no distension. There is no tenderness.  Musculoskeletal: Normal range of motion.  Neurological: She is alert and oriented to person, place, and time.  Skin: Skin is warm and dry.  Psychiatric: She has a normal mood and affect. Judgment normal.  Nursing note and vitals reviewed.   ED Course  Procedures (including critical care time) Labs Review Labs Reviewed  CBC - Abnormal; Notable for the following:    Hemoglobin 7.8 (*)    HCT 28.6 (*)    MCV 61.2 (*)    MCH 16.7 (*)  MCHC 27.3 (*)    RDW 19.8 (*)    All other components within normal limits  BASIC METABOLIC PANEL - Abnormal; Notable for the following:    Potassium 3.0 (*)    Glucose, Bld 100 (*)    All other components within normal limits  URINALYSIS, ROUTINE W REFLEX MICROSCOPIC (NOT AT [facility] - Abnormal; Notable for the following:    APPearance CLOUDY (*)    Ketones, ur 40 (*)    Protein, ur 30 (*)    Leukocytes, UA SMALL (*)    All other components within normal limits  URINE MICROSCOPIC-ADD ON - Abnormal; Notable for the following:    Squamous Epithelial / LPF MANY (*)    Bacteria, UA MANY (*)    All other components within normal limits  I-STAT BETA HCG BLOOD, ED ([facility], [facility], [facility] ONLY)    HEMOGLOBIN  Date Value Ref Range Status  09/20/2015 7.8* 12.0 - 15.0 g/dL Final  16/09/9603 8.6* 12.0 - 15.0 g/dL Final      Imaging Review Ct Head Wo Contrast  09/20/2015   CLINICAL DATA:  Dizziness, lightheadedness, nausea vomiting and visual disturbances. History of hypertension.  EXAM: CT HEAD WITHOUT CONTRAST  TECHNIQUE: Contiguous axial images were obtained from the base of the skull through the vertex without intravenous contrast.  COMPARISON:  02/10/2012  FINDINGS: No mass effect or midline shift. No evidence of acute intracranial hemorrhage, or infarction. No abnormal extra-axial fluid collections. Gray-white matter differentiation is normal. Basal cisterns are preserved.  No depressed skull fractures. Visualized paranasal sinuses and mastoid air cells are not opacified.  IMPRESSION: No acute intracranial abnormality.   Electronically Signed   By: Ted Mcalpine M.D.   On: 09/20/2015 13:26   I have personally reviewed and evaluated these images and lab results as part of my medical decision-making.   EKG Interpretation None      MDM   Final diagnoses:  Vertigo  Secondary hypertension, unspecified  Chronic anemia  Hypokalemia    patient feels much better after IV fluids and medication the emergency department including meclizine and Valium.  Discharge home in good condition.  Suspect peripheral vertigo.    Medications  sodium chloride 0.9 % bolus 1,000 mL (0 mLs Intravenous Stopped 09/20/15 1535)  ondansetron (ZOFRAN) injection 4 mg (4 mg Intravenous Given 09/20/15 1247)  diazepam (VALIUM) tablet 5 mg (5 mg Oral Given 09/20/15 1247)  meclizine (ANTIVERT) tablet 25 mg (25 mg Oral Given 09/20/15 1247)       Azalia Bilis, MD 09/24/15 0013

## 2015-09-20 NOTE — ED Notes (Signed)
Pt up and to the restroom in attempt to collect a urine sample.

## 2015-09-20 NOTE — Discharge Instructions (Signed)
Benign Positional Vertigo Vertigo means you feel like you or your surroundings are moving when they are not. Benign positional vertigo is the most common form of vertigo. Benign means that the cause of your condition is not serious. Benign positional vertigo is more common in older adults. CAUSES  Benign positional vertigo is the result of an upset in the labyrinth system. This is an area in the middle ear that helps control your balance. This may be caused by a viral infection, head injury, or repetitive motion. However, often no specific cause is found. SYMPTOMS  Symptoms of benign positional vertigo occur when you move your head or eyes in different directions. Some of the symptoms may include:  Loss of balance and falls.  Vomiting.  Blurred vision.  Dizziness.  Nausea.  Involuntary eye movements (nystagmus). DIAGNOSIS  Benign positional vertigo is usually diagnosed by physical exam. If the specific cause of your benign positional vertigo is unknown, your caregiver may perform imaging tests, such as magnetic resonance imaging (MRI) or computed tomography (CT). TREATMENT  Your caregiver may recommend movements or procedures to correct the benign positional vertigo. Medicines such as meclizine, benzodiazepines, and medicines for nausea may be used to treat your symptoms. In rare cases, if your symptoms are caused by certain conditions that affect the inner ear, you may need surgery. HOME CARE INSTRUCTIONS   Follow your caregiver's instructions.  Move slowly. Do not make sudden body or head movements.  Avoid driving.  Avoid operating heavy machinery.  Avoid performing any tasks that would be dangerous to you or others during a vertigo episode.  Drink enough fluids to keep your urine clear or pale yellow. SEEK IMMEDIATE MEDICAL CARE IF:   You develop problems with walking, weakness, numbness, or using your arms, hands, or legs.  You have difficulty speaking.  You develop  severe headaches.  Your nausea or vomiting continues or gets worse.  You develop visual changes.  Your family or friends notice any behavioral changes.  Your condition gets worse.  You have a fever.  You develop a stiff neck or sensitivity to light. MAKE SURE YOU:   Understand these instructions.  Will watch your condition.  Will get help right away if you are not doing well or get worse. Document Released: 09/11/2006 Document Revised: 02/26/2012 Document Reviewed: 08/24/2011 Exit[employer] Patient Information 2015 [employer]. This information is not intended to replace advice given to you by your health care provider. Make sure you discuss any questions you have with your health care provider.    

## 2015-09-20 NOTE — ED Notes (Signed)
Pt arrived via EMS with report of upon awaken she felt dizziness/lightheadedness, feels like room spinning, n/v x3, visual disturbances and no headaches. Pt reported having hx of HTN but noncompliance with medication.

## 2015-09-20 NOTE — ED Notes (Signed)
Bed: WA20 Expected date:  Expected time:  Means of arrival:  Comments: EMS- 44yo F, dizziness/HTN

## 2015-09-21 ENCOUNTER — Telehealth: Payer: Self-pay | Admitting: Internal Medicine

## 2015-09-21 NOTE — Telephone Encounter (Signed)
Patient called in regards of a med that was prescribed to her in the ED,losartan (COZAAR) 50 MG tablet. Patient was told that the med had top be approved by medicaid, and that the provider was going to let her know if medicaid was going to approve it. Please f/u with pt.

## 2015-09-24 ENCOUNTER — Telehealth: Payer: Self-pay

## 2015-09-24 NOTE — Telephone Encounter (Signed)
Patient needs prior auth on her new medicaton losartan Can you take care of this

## 2015-09-27 NOTE — Telephone Encounter (Signed)
CMA called Pt, pt verified DOB. Pt states that she need prior authorization per her pharmacy on file for refill of her losartan. Pt has an appt on 09/29/15 and would like her medication to be ready for pick-up here at our Pharmacy during her visit.

## 2015-09-27 NOTE — Telephone Encounter (Signed)
Patient called her pharmacy yesterday and they still haven't received approval for her losartan (COZAAR) 50 MG tablet. It seems like it might be a prior authorization. Please follow up with pt. Thank you.

## 2015-09-28 NOTE — Telephone Encounter (Signed)
CMA spoke with pt, pt verified her name and DOB. Pt has an appt tomorrow and was informed that rx will need prior authorization and will be process once Valerie see's pt.

## 2015-09-28 NOTE — Telephone Encounter (Signed)
Pt didn't answer the phone. Left generic message for the pt to call me back.

## 2015-09-28 NOTE — Telephone Encounter (Signed)
Patient called requesting to speak to CMA on the status of her losartan (COZAAR) 50 MG tablet. Please f/u with pt.

## 2015-09-29 ENCOUNTER — Encounter: Payer: Self-pay | Admitting: Internal Medicine

## 2015-09-29 ENCOUNTER — Ambulatory Visit: Payer: Medicaid Other | Attending: Internal Medicine | Admitting: Internal Medicine

## 2015-09-29 VITALS — BP 168/99 | HR 64 | Temp 98.0°F | Resp 16 | Ht 63.0 in | Wt 136.4 lb

## 2015-09-29 DIAGNOSIS — I10 Essential (primary) hypertension: Secondary | ICD-10-CM | POA: Diagnosis not present

## 2015-09-29 DIAGNOSIS — D509 Iron deficiency anemia, unspecified: Secondary | ICD-10-CM | POA: Insufficient documentation

## 2015-09-29 MED ORDER — FERROUS SULFATE 325 (65 FE) MG PO TABS: 325.0000 mg | ORAL_TABLET | Freq: Every day | ORAL | Status: DC

## 2015-09-29 MED ORDER — LOSARTAN POTASSIUM 100 MG PO TABS: 100.0000 mg | ORAL_TABLET | Freq: Every day | ORAL | Status: DC

## 2015-09-29 NOTE — Progress Notes (Signed)
Patient states she is here for follow up from the ED Patient was seen for high blood pressure and dizzyness Per ED notes patient is non compliant with taking her medications Presents in office with elevated blood pressure but states she takes her medication At night Patient declined the flu vaccine

## 2015-09-29 NOTE — Progress Notes (Signed)
Patient ID: Sarah Myers, female   DOB: 1971-08-12, 44 y.o.   MRN: [id number]  CC: ED follow up   HPI: Sarah Myers is a 44 y.o. female here today for a follow up visit.  Patient has past medical history of hypertension and anemia. Patient reports being seen in the ED 7 days ago for dizziness and elevated blood pressure. At that time she was also found to have hypokalemia and was discharged with potassium replacement pills and ferrous sulfate for a hemoglobin of 7.8. Today she comes in for a follow up of blood pressure but states that she takes her medication at bedtime because she feels tired and weak after taking the medication. She reports being off her Losartan for several months before starting back 7 days ago. She still admits to having dizziness with standing.  Patient has No headache, No chest pain, No abdominal pain - No Nausea, No new weakness tingling or numbness, No Cough - SOB.  No Known Allergies Past Medical History  Diagnosis Date  . Hypertension    Current Outpatient Prescriptions on File Prior to Visit  Medication Sig Dispense Refill  . losartan (COZAAR) 50 MG tablet Take 1 tablet (50 mg total) by mouth daily. 30 tablet 1  . ferrous sulfate 325 (65 FE) MG tablet Take 1 tablet (325 mg total) by mouth daily. 30 tablet 0  . meclizine (ANTIVERT) 25 MG tablet Take 1 tablet (25 mg total) by mouth 3 (three) times daily as needed for dizziness. 15 tablet 0  . potassium chloride (K-DUR) 10 MEQ tablet Take 1 tablet (10 mEq total) by mouth daily. 4 tablet 0   No current facility-administered medications on file prior to visit.   Family History  Problem Relation Age of Onset  . Diabetes Mother   . Diabetes Maternal Aunt   . Stroke Maternal Uncle   . Diabetes Maternal Grandmother   . Heart disease Maternal Grandmother   . Diabetes Maternal Grandfather   . Heart disease Maternal Grandfather    Social History   Social History  . Marital Status: Married    Spouse Name:  N/A  . Number of Children: N/A  . Years of Education: N/A   Occupational History  . Not on file.   Social History Main Topics  . Smoking status: Never Smoker   . Smokeless tobacco: Not on file  . Alcohol Use: 0.0 oz/week    0 Standard drinks or equivalent per week  . Drug Use: No  . Sexual Activity: No   Other Topics Concern  . Not on file   Social History Narrative    Review of Systems: Other than what is stated in HPI, all other systems are negative.   Objective:   Filed Vitals:   09/29/15 1135  BP: 168/99  Pulse: 64  Temp: 98 F (36.7 C)  Resp: 16    Physical Exam  Constitutional: She is oriented to person, place, and time.  Neck: No JVD present.  Cardiovascular: Normal rate, regular rhythm and normal heart sounds.   Pulmonary/Chest: Effort normal and breath sounds normal.  Musculoskeletal: She exhibits no edema.  Neurological: She is alert and oriented to person, place, and time.  Skin: Skin is warm and dry.  Psychiatric: She has a normal mood and affect.     Lab Results  Component Value Date   WBC 7.5 09/20/2015   HGB 7.8* 09/20/2015   HCT 28.6* 09/20/2015   MCV 61.2* 09/20/2015   PLT 248 09/20/2015  Lab Results  Component Value Date   CREATININE 0.69 09/20/2015   BUN 10 09/20/2015   NA 138 09/20/2015   K 3.0* 09/20/2015   CL 106 09/20/2015   CO2 24 09/20/2015    No results found for: HGBA1C Lipid Panel  No results found for: CHOL, TRIG, HDL, CHOLHDL, VLDL, LDLCALC     Assessment and plan:   Sarah Myers was seen today for follow-up.  Diagnoses and all orders for this visit:  Malignant hypertension -     losartan (COZAAR) 100 MG tablet; Take 1 tablet (100 mg total) by mouth daily. Patient's BP is still not controlled on 50 mg so I will increase to 100 mg daily and recheck in 2 weeks. Dash diet advised.   Iron deficiency anemia -     ferrous sulfate 325 (65 FE) MG tablet; Take 1 tablet (325 mg total) by mouth daily. Last hemoglobin was  7.8. Will refill ferrous sulfate and recheck on next visit. Explained that she may need a stool softener due to constipation associated with ferrous sulfate.  Return in about 2 weeks (around 10/13/2015) for Nurse Visit-BP check and 3 mo PCP visit.       Ambrose Finland, NP-C [facility] [phone] 09/29/2015, 12:01 PM

## 2015-10-13 ENCOUNTER — Encounter: Payer: Medicaid Other | Admitting: Pharmacist

## 2015-10-14 ENCOUNTER — Encounter: Payer: Self-pay | Admitting: Pharmacist

## 2015-10-14 ENCOUNTER — Ambulatory Visit: Payer: Medicaid Other | Attending: Internal Medicine | Admitting: Pharmacist

## 2015-10-14 VITALS — BP 152/96 | HR 68

## 2015-10-14 DIAGNOSIS — Z79899 Other long term (current) drug therapy: Secondary | ICD-10-CM | POA: Diagnosis not present

## 2015-10-14 DIAGNOSIS — I10 Essential (primary) hypertension: Secondary | ICD-10-CM | POA: Insufficient documentation

## 2015-10-14 NOTE — Patient Instructions (Addendum)
Thanks for coming in to see me  Continue the losartan 100 mg daily.  Follow up with Vikki Ports about the urinary leakage   See if you can get that blood pressure cuff out of storage and check some blood pressures at home and let us know what they are
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
## 2015-10-20 ENCOUNTER — Ambulatory Visit: Payer: Medicaid Other | Admitting: Internal Medicine

## 2015-11-03 ENCOUNTER — Ambulatory Visit: Payer: Medicaid Other | Attending: Internal Medicine | Admitting: Internal Medicine

## 2015-11-03 ENCOUNTER — Encounter: Payer: Self-pay | Admitting: Internal Medicine

## 2015-11-03 VITALS — BP 153/102 | HR 69 | Temp 98.0°F | Resp 16 | Ht 63.0 in | Wt 136.8 lb

## 2015-11-03 DIAGNOSIS — I10 Essential (primary) hypertension: Secondary | ICD-10-CM

## 2015-11-03 DIAGNOSIS — R030 Elevated blood-pressure reading, without diagnosis of hypertension: Secondary | ICD-10-CM | POA: Diagnosis not present

## 2015-11-03 DIAGNOSIS — IMO0001 Reserved for inherently not codable concepts without codable children: Secondary | ICD-10-CM

## 2015-11-03 MED ORDER — CLONIDINE HCL 0.1 MG PO TABS
0.1000 mg | ORAL_TABLET | Freq: Once | ORAL | Status: AC
  Administered 2015-11-03: 0.1 mg via ORAL

## 2015-11-03 MED ORDER — AMLODIPINE BESYLATE 5 MG PO TABS: 5.0000 mg | ORAL_TABLET | Freq: Every day | ORAL | Status: DC

## 2015-11-03 MED ORDER — LOSARTAN POTASSIUM 100 MG PO TABS: 100.0000 mg | ORAL_TABLET | Freq: Every day | ORAL | Status: DC

## 2015-11-03 NOTE — Progress Notes (Signed)
Patient states she is here for follow up Patient did state when she saw stacey she mentioned urinary leakage but That has stopped Presents in office with elevated blood pressure  Patient stated she is under a lot of stress at home and at work catapress 0.1mg  given per office protocol

## 2015-11-03 NOTE — Progress Notes (Signed)
Patient ID: Sarah Myers, female   DOB: 1971/02/20, 44 y.o.   MRN: [id number]  CC: BP follow up   HPI: Sarah Myers is a 44 y.o. female here today for a follow up visit.  Patient has past medical history of malignant hypertension. Patient reports that she takes her Losartan at bedtime to prevent fatigue and drowsiness during the day. She states that she has continued to remain under stress at home and at work and knows that it is affecting her health. She is not a smoker. She denies symptoms of headache, chest pain, SOB, palpitations, or edema.    No Known Allergies Past Medical History  Diagnosis Date  . Hypertension    Current Outpatient Prescriptions on File Prior to Visit  Medication Sig Dispense Refill  . ferrous sulfate 325 (65 FE) MG tablet Take 1 tablet (325 mg total) by mouth daily. 60 tablet 5  . losartan (COZAAR) 100 MG tablet Take 1 tablet (100 mg total) by mouth daily. 30 tablet 4  . meclizine (ANTIVERT) 25 MG tablet Take 1 tablet (25 mg total) by mouth 3 (three) times daily as needed for dizziness. 15 tablet 0  . potassium chloride (K-DUR) 10 MEQ tablet Take 1 tablet (10 mEq total) by mouth daily. 4 tablet 0   No current facility-administered medications on file prior to visit.   Family History  Problem Relation Age of Onset  . Diabetes Mother   . Diabetes Maternal Aunt   . Stroke Maternal Uncle   . Diabetes Maternal Grandmother   . Heart disease Maternal Grandmother   . Diabetes Maternal Grandfather   . Heart disease Maternal Grandfather    Social History   Social History  . Marital Status: Married    Spouse Name: N/A  . Number of Children: N/A  . Years of Education: N/A   Occupational History  . Not on file.   Social History Main Topics  . Smoking status: Never Smoker   . Smokeless tobacco: Not on file  . Alcohol Use: 0.0 oz/week    0 Standard drinks or equivalent per week  . Drug Use: No  . Sexual Activity: No   Other Topics Concern  . Not  on file   Social History Narrative    Review of Systems: Constitutional: Negative for fever, chills, diaphoresis, activity change, appetite change and fatigue. HENT: Negative for ear pain, nosebleeds, congestion, facial swelling, rhinorrhea, neck pain, neck stiffness and ear discharge.  Eyes: Negative for pain, discharge, redness, itching and visual disturbance. Respiratory: Negative for cough, choking, chest tightness, shortness of breath, wheezing and stridor.  Cardiovascular: Negative for chest pain, palpitations and leg swelling. Gastrointestinal: Negative for abdominal distention. Genitourinary: Negative for dysuria, urgency, frequency, hematuria, flank pain, decreased urine volume, difficulty urinating and dyspareunia.  Musculoskeletal: Negative for back pain, joint swelling, arthralgias and gait problem. Neurological: Negative for dizziness, tremors, seizures, syncope, facial asymmetry, speech difficulty, weakness, light-headedness, numbness and headaches.  Hematological: Negative for adenopathy. Does not bruise/bleed easily. Psychiatric/Behavioral: Negative for hallucinations, behavioral problems, confusion, dysphoric mood, decreased concentration and agitation.    Objective:   Filed Vitals:   11/03/15 1037  BP: 169/109  Pulse: 76  Temp:   Resp:     Physical Exam  Constitutional: She is oriented to person, place, and time.  Neck: No JVD present.  Cardiovascular: Normal rate, regular rhythm and normal heart sounds.   Pulmonary/Chest: Effort normal and breath sounds normal.  Musculoskeletal: She exhibits no edema.  Neurological: She is alert  and oriented to person, place, and time.  Psychiatric: She has a normal mood and affect.     Lab Results  Component Value Date   WBC 7.5 09/20/2015   HGB 7.8* 09/20/2015   HCT 28.6* 09/20/2015   MCV 61.2* 09/20/2015   PLT 248 09/20/2015   Lab Results  Component Value Date   CREATININE 0.69 09/20/2015   BUN 10 09/20/2015    NA 138 09/20/2015   K 3.0* 09/20/2015   CL 106 09/20/2015   CO2 24 09/20/2015    No results found for: HGBA1C Lipid Panel  No results found for: CHOL, TRIG, HDL, CHOLHDL, VLDL, LDLCALC     Assessment and plan:   Sarah Myers was seen today for follow-up.  Diagnoses and all orders for this visit:  Malignant hypertension -     amLODipine (NORVASC) 5 MG tablet; Take 1 tablet (5 mg total) by mouth daily. -     losartan (COZAAR) 100 MG tablet; Take 1 tablet (100 mg total) by mouth daily. BP is severely elevated today. She will continue Losartan and we will begin Amlodipine 5 mg. She will return in 2 weeks for a BP check if she is still elevated at that time then I will increase dose.   Elevated blood pressure -     cloNIDine (CATAPRES) tablet 0.1 mg; Take 1 tablet (0.1 mg total) by mouth once.  Return in about 2 weeks (around 11/17/2015) for Stacy-BP check and 3 mo PCP .        Sarah Finland, NP-C [facility] [phone] 11/03/2015, 11:12 AM

## 2015-11-17 ENCOUNTER — Encounter: Payer: Medicaid Other | Admitting: Pharmacist

## 2015-11-23 ENCOUNTER — Encounter: Payer: Medicaid Other | Admitting: Pharmacist

## 2015-12-16 ENCOUNTER — Encounter: Payer: Medicaid Other | Admitting: Pharmacist

## 2015-12-21 MED FILL — LOSARTAN POTASSIUM 100 MG T: 100 | 30 days supply | Qty: 30 | Fill #1

## 2015-12-21 MED FILL — FERROUS SULFATE 325 MG TAB: 325 (65 FE) | 60 days supply | Qty: 60 | Fill #1

## 2015-12-21 MED FILL — AMLODIPINE BESYLATE 5 MG TA: 5 | 30 days supply | Qty: 30 | Fill #1

## 2016-06-09 ENCOUNTER — Encounter (HOSPITAL_COMMUNITY): Payer: Self-pay | Admitting: Emergency Medicine

## 2016-06-09 ENCOUNTER — Emergency Department (HOSPITAL_COMMUNITY)
Admission: EM | Admit: 2016-06-09 | Discharge: 2016-06-09 | Disposition: A | Discharge status: Home / Self Care | Payer: Medicaid Other | Attending: Emergency Medicine | Admitting: Emergency Medicine

## 2016-06-09 DIAGNOSIS — I159 Secondary hypertension, unspecified: Secondary | ICD-10-CM

## 2016-06-09 DIAGNOSIS — R42 Dizziness and giddiness: Secondary | ICD-10-CM | POA: Diagnosis present

## 2016-06-09 DIAGNOSIS — Z8679 Personal history of other diseases of the circulatory system: Secondary | ICD-10-CM | POA: Diagnosis not present

## 2016-06-09 LAB — I-STAT CHEM 8, ED
BUN: 14 mg/dL (ref 6–20)
CHLORIDE: 105 mmol/L (ref 101–111)
Calcium, Ion: 1.02 mmol/L — ABNORMAL LOW (ref 1.12–1.23)
Creatinine, Ser: 0.8 mg/dL (ref 0.44–1.00)
Glucose, Bld: 79 mg/dL (ref 65–99)
HCT: 38 % (ref 36.0–46.0)
Hemoglobin: 12.9 g/dL (ref 12.0–15.0)
Potassium: 3.8 mmol/L (ref 3.5–5.1)
SODIUM: 138 mmol/L (ref 135–145)
TCO2: 24 mmol/L (ref 0–100)

## 2016-06-09 MED ORDER — HYDROCHLOROTHIAZIDE 12.5 MG PO CAPS
12.5000 mg | ORAL_CAPSULE | Freq: Once | ORAL | Status: AC
  Administered 2016-06-09: 12.5 mg via ORAL
  Filled 2016-06-09: qty 1

## 2016-06-09 MED ORDER — HYDROCHLOROTHIAZIDE 12.5 MG PO TABS: 25.0000 mg | ORAL_TABLET | Freq: Every day | ORAL | Status: DC

## 2016-06-09 MED ORDER — LISINOPRIL 20 MG PO TABS: 20.0000 mg | ORAL_TABLET | Freq: Every day | ORAL | Status: DC

## 2016-06-09 MED ORDER — LISINOPRIL 20 MG PO TABS
20.0000 mg | ORAL_TABLET | Freq: Once | ORAL | Status: AC
  Administered 2016-06-09: 20 mg via ORAL
  Filled 2016-06-09: qty 1

## 2016-06-09 NOTE — ED Provider Notes (Signed)
CSN: [id number]     Arrival date & time 06/09/16  1533 History   First MD Initiated Contact with Patient 06/09/16 1720     Chief Complaint  Patient presents with  . Dizziness     (Consider location/radiation/quality/duration/timing/severity/associated sxs/prior Treatment) Patient is a 45 y.o. female presenting with dizziness. The history is provided by the patient.  Dizziness  Sarah Myers is a 45 y.o. female who presents for evaluation of intermittent blurry vision, right eye, described as "a line in front of it", for the last several days. She also complains of ongoing "spots in my eyes", describing both eyes, for many months. This symptom is also intermittent. She has been off her blood pressure medication for several months, because it "caused bad dreams". She denies problems walking. She occasionally has dizziness but it comes and goes. Today she had to leave work because of discomfort. She has a mild headache today. She describes being under stress recently. There are no other known modifying factors.    Past Medical History  Diagnosis Date  . Hypertension    Past Surgical History  Procedure Laterality Date  . Cesarean section    . Tubal ligation    . Orif radial fracture  02/10/2012    Procedure: OPEN REDUCTION INTERNAL FIXATION (ORIF) RADIAL FRACTURE;  Surgeon: Cammy Copa, MD;  Location: [facility] OR;  Service: Orthopedics;  Laterality: Right;  . Orif ulnar fracture  02/10/2012    Procedure: OPEN REDUCTION INTERNAL FIXATION (ORIF) ULNAR FRACTURE;  Surgeon: Cammy Copa, MD;  Location: [facility] OR;  Service: Orthopedics;  Laterality: Right;   Family History  Problem Relation Age of Onset  . Diabetes Mother   . Diabetes Maternal Aunt   . Stroke Maternal Uncle   . Diabetes Maternal Grandmother   . Heart disease Maternal Grandmother   . Diabetes Maternal Grandfather   . Heart disease Maternal Grandfather    Social History  Substance Use Topics  . Smoking status:  Never Smoker   . Smokeless tobacco: None  . Alcohol Use: 0.0 oz/week    0 Standard drinks or equivalent per week   OB History    No data available     Review of Systems  Neurological: Positive for dizziness.  All other systems reviewed and are negative.     Allergies  Review of patient's allergies indicates no known allergies.  Home Medications   Prior to Admission medications   Medication Sig Start Date End Date Taking? Authorizing Provider  acetaminophen (TYLENOL) 500 MG tablet Take 1,000 mg by mouth every 6 (six) hours as needed for moderate pain or headache.   Yes Historical Provider, MD  hydrochlorothiazide (HYDRODIURIL) 12.5 MG tablet Take 2 tablets (25 mg total) by mouth daily. 06/09/16   Mancel Bale, MD  lisinopril (PRINIVIL,ZESTRIL) 20 MG tablet Take 1 tablet (20 mg total) by mouth daily. 06/09/16   Mancel Bale, MD   BP 206/109 mmHg  Pulse 73  Temp(Src) 98.7 F (37.1 C) (Oral)  Resp 18  SpO2 99% Physical Exam  Constitutional: She is oriented to person, place, and time. She appears well-developed and well-nourished. No distress.  HENT:  Head: Normocephalic and atraumatic.  Right Ear: External ear normal.  Left Ear: External ear normal.  Eyes: Conjunctivae and EOM are normal. Pupils are equal, round, and reactive to light.  Neck: Normal range of motion and phonation normal. Neck supple.  Cardiovascular: Normal rate, regular rhythm and normal heart sounds.   Pulmonary/Chest: Effort normal and breath  sounds normal. She exhibits no bony tenderness.  Abdominal: Soft. There is no tenderness.  Musculoskeletal: Normal range of motion.  Neurological: She is alert and oriented to person, place, and time. No cranial nerve deficit or sensory deficit. She exhibits normal muscle tone. Coordination normal.  No dysarthria and aphasia or nystagmus.  Skin: Skin is warm, dry and intact.  Psychiatric: She has a normal mood and affect. Her behavior is normal. Judgment and thought  content normal.  Nursing note and vitals reviewed.   ED Course  Procedures (including critical care time)  Medications  lisinopril (PRINIVIL,ZESTRIL) tablet 20 mg (20 mg Oral Given 06/09/16 1759)  hydrochlorothiazide (MICROZIDE) capsule 12.5 mg (12.5 mg Oral Given 06/09/16 1800)    Patient Vitals for the past 24 hrs:  BP Temp Temp src Pulse Resp SpO2  06/09/16 1840 (!) 206/109 mmHg - - 73 18 99 %  06/09/16 1741 (!) 183/106 mmHg 98.7 F (37.1 C) Oral 65 18 98 %  06/09/16 1621 (!) 201/100 mmHg 99.1 F (37.3 C) Oral 70 16 97 %    At discharge- Reevaluation with update and discussion. After initial assessment and treatment, an updated evaluation reveals she remains comfortable. No further complaints. Findings discussed with the patient and all questions answered. Joniel Graumann L     Labs Review Labs Reviewed  I-STAT CHEM 8, ED - Abnormal; Notable for the following:    Calcium, Ion 1.02 (*)    All other components within normal limits    Imaging Review No results found. I have personally reviewed and evaluated these images and lab results as part of my medical decision-making.   EKG Interpretation None      MDM   Final diagnoses:  Secondary hypertension, unspecified    Hypertension secondary to medication noncompliance. Nonspecific visual symptoms. Doubt hypertensive urgency, CVA, metabolic instability or impending vascular collapse.   Nursing Notes Reviewed/ Care Coordinated Applicable Imaging Reviewed Interpretation of Laboratory Data incorporated into ED treatment  The patient appears reasonably screened and/or stabilized for discharge and I doubt any other medical condition or other [facility] requiring further screening, evaluation, or treatment in the ED at this time prior to discharge.  Plan: Home Medications- Lisinopril, HCTZ; Home Treatments- rest; return here if the recommended treatment, does not improve the symptoms; Recommended follow up- PCP 1 week for check  up     Mancel Bale, MD 06/09/16 1911

## 2016-06-09 NOTE — ED Notes (Addendum)
C/o dizziness that started three days ago, states when it started it looked like a sheet came over her left eye. C/o frontal headache. Hx of high blood pressure, uncontrolled. C/o intermittent numbness/tingling in all extremities. No obvious neuro deficits in triage.

## 2016-06-09 NOTE — Discharge Instructions (Signed)
Hypertension Hypertension, commonly called high blood pressure, is when the force of blood pumping through your arteries is too strong. Your arteries are the blood vessels that carry blood from your heart throughout your body. A blood pressure reading consists of a higher number over a lower number, such as 110/72. The higher number (systolic) is the pressure inside your arteries when your heart pumps. The lower number (diastolic) is the pressure inside your arteries when your heart relaxes. Ideally you want your blood pressure below 120/80. Hypertension forces your heart to work harder to pump blood. Your arteries may become narrow or stiff. Having untreated or uncontrolled hypertension can cause heart attack, stroke, kidney disease, and other problems. RISK FACTORS Some risk factors for high blood pressure are controllable. Others are not.  Risk factors you cannot control include:   Race. You may be at higher risk if you are African American.  Age. Risk increases with age.  Gender. Men are at higher risk than women before age 45 years. After age 65, women are at higher risk than men. Risk factors you can control include:  Not getting enough exercise or physical activity.  Being overweight.  Getting too much fat, sugar, calories, or salt in your diet.  Drinking too much alcohol. SIGNS AND SYMPTOMS Hypertension does not usually cause signs or symptoms. Extremely high blood pressure (hypertensive crisis) may cause headache, anxiety, shortness of breath, and nosebleed. DIAGNOSIS To check if you have hypertension, your health care provider will measure your blood pressure while you are seated, with your arm held at the level of your heart. It should be measured at least twice using the same arm. Certain conditions can cause a difference in blood pressure between your right and left arms. A blood pressure reading that is higher than normal on one occasion does not mean that you need treatment. If  it is not clear whether you have high blood pressure, you may be asked to return on a different day to have your blood pressure checked again. Or, you may be asked to monitor your blood pressure at home for 1 or more weeks. TREATMENT Treating high blood pressure includes making lifestyle changes and possibly taking medicine. Living a healthy lifestyle can help lower high blood pressure. You may need to change some of your habits. Lifestyle changes may include:  Following the DASH diet. This diet is high in fruits, vegetables, and whole grains. It is low in salt, red meat, and added sugars.  Keep your sodium intake below 2,300 mg per day.  Getting at least 30-45 minutes of aerobic exercise at least 4 times per week.  Losing weight if necessary.  Not smoking.  Limiting alcoholic beverages.  Learning ways to reduce stress. Your health care provider may prescribe medicine if lifestyle changes are not enough to get your blood pressure under control, and if one of the following is true:  You are 18-59 years of age and your systolic blood pressure is above 140.  You are 60 years of age or older, and your systolic blood pressure is above 150.  Your diastolic blood pressure is above 90.  You have diabetes, and your systolic blood pressure is over 140 or your diastolic blood pressure is over 90.  You have kidney disease and your blood pressure is above 140/90.  You have heart disease and your blood pressure is above 140/90. Your personal target blood pressure may vary depending on your medical conditions, your age, and other factors. HOME CARE INSTRUCTIONS    Have your blood pressure rechecked as directed by your health care provider.   Take medicines only as directed by your health care provider. Follow the directions carefully. Blood pressure medicines must be taken as prescribed. The medicine does not work as well when you skip doses. Skipping doses also puts you at risk for  problems.  Do not smoke.   Monitor your blood pressure at home as directed by your health care provider. SEEK MEDICAL CARE IF:   You think you are having a reaction to medicines taken.  You have recurrent headaches or feel dizzy.  You have swelling in your ankles.  You have trouble with your vision. SEEK IMMEDIATE MEDICAL CARE IF:  You develop a severe headache or confusion.  You have unusual weakness, numbness, or feel faint.  You have severe chest or abdominal pain.  You vomit repeatedly.  You have trouble breathing. MAKE SURE YOU:   Understand these instructions.  Will watch your condition.  Will get help right away if you are not doing well or get worse.   This information is not intended to replace advice given to you by your health care provider. Make sure you discuss any questions you have with your health care provider.   Document Released: 12/04/2005 Document Revised: 04/20/2015 Document Reviewed: 09/26/2013 Elsevier Interactive Patient Education 2016 [employer]  

## 2016-06-09 NOTE — ED Notes (Addendum)
Notified EDP of patient condition and that no order set was used d/t promptly getting patient back to room

## 2016-07-11 ENCOUNTER — Telehealth: Payer: Self-pay | Admitting: Internal Medicine

## 2016-07-11 MED ORDER — HYDROCHLOROTHIAZIDE 12.5 MG PO TABS
25.0000 mg | ORAL_TABLET | Freq: Every day | ORAL | 0 refills | Status: DC
Start: 2016-07-11 — End: 2016-08-16

## 2016-07-11 MED ORDER — LISINOPRIL 20 MG PO TABS: 20.0000 mg | ORAL_TABLET | Freq: Every day | ORAL | 0 refills | Status: DC

## 2016-07-11 NOTE — Telephone Encounter (Signed)
Requested medications refilled - patient needs office visit to establish with new primary care provider for further refills.

## 2016-07-11 NOTE — Telephone Encounter (Signed)
Pt. Called requesting a refill on the following medications:   hydrochlorothiazide (HYDRODIURIL) 12.5 MG tablet  lisinopril (PRINIVIL,ZESTRIL) 20 MG tablet   Pt. Uses the [facility] on [address]  Please f/u.

## 2016-08-02 ENCOUNTER — Ambulatory Visit: Payer: Medicaid Other | Admitting: Family Medicine

## 2016-08-04 ENCOUNTER — Other Ambulatory Visit: Payer: Self-pay | Admitting: Internal Medicine

## 2016-08-16 ENCOUNTER — Telehealth: Payer: Self-pay | Admitting: Internal Medicine

## 2016-08-16 MED ORDER — HYDROCHLOROTHIAZIDE 12.5 MG PO TABS: 25.0000 mg | ORAL_TABLET | Freq: Every day | ORAL | 0 refills | Status: DC

## 2016-08-16 MED ORDER — LISINOPRIL 20 MG PO TABS: 20.0000 mg | ORAL_TABLET | Freq: Every day | ORAL | 0 refills | Status: DC

## 2016-08-16 NOTE — Telephone Encounter (Signed)
Refilled x 30 days - this is the last refill until patient is seen by provider.

## 2016-08-16 NOTE — Telephone Encounter (Signed)
Pt. Called requesting a refill on the following medications:  lisinopril (PRINIVIL,ZESTRIL) 20 MG tablet   hydrochlorothiazide (HYDRODIURIL) 12.5 MG tablet   Pt. Uses [facility]. Please f/u

## 2016-09-13 ENCOUNTER — Telehealth: Payer: Self-pay | Admitting: Internal Medicine

## 2016-09-13 MED ORDER — LISINOPRIL 20 MG PO TABS: 20.0000 mg | ORAL_TABLET | Freq: Every day | ORAL | 0 refills | Status: DC

## 2016-09-13 MED ORDER — HYDROCHLOROTHIAZIDE 12.5 MG PO TABS: 25.0000 mg | ORAL_TABLET | Freq: Every day | ORAL | 0 refills | Status: DC

## 2016-09-13 NOTE — Telephone Encounter (Signed)
Requested medications refilled - patient needs office visit

## 2016-09-13 NOTE — Telephone Encounter (Signed)
Patient called the office to schedule an appt as well as request refills for hydrochlorothiazide (HYDRODIURIL) 12.5 MG tablet  and lisinopril (PRINIVIL,ZESTRIL) 20 MG tablet. Please call rx to [facility] on [address]   Thank you

## 2016-09-26 ENCOUNTER — Ambulatory Visit: Payer: Medicaid Other | Attending: Internal Medicine | Admitting: Internal Medicine

## 2016-09-26 ENCOUNTER — Encounter: Payer: Self-pay | Admitting: Internal Medicine

## 2016-09-26 VITALS — BP 165/94 | HR 65 | Temp 98.0°F | Resp 16 | Wt 143.8 lb

## 2016-09-26 DIAGNOSIS — I10 Essential (primary) hypertension: Secondary | ICD-10-CM | POA: Diagnosis not present

## 2016-09-26 DIAGNOSIS — Z23 Encounter for immunization: Secondary | ICD-10-CM | POA: Diagnosis not present

## 2016-09-26 DIAGNOSIS — Z131 Encounter for screening for diabetes mellitus: Secondary | ICD-10-CM | POA: Diagnosis not present

## 2016-09-26 DIAGNOSIS — Z8632 Personal history of gestational diabetes: Secondary | ICD-10-CM | POA: Diagnosis present

## 2016-09-26 DIAGNOSIS — F329 Major depressive disorder, single episode, unspecified: Secondary | ICD-10-CM

## 2016-09-26 DIAGNOSIS — F32A Depression, unspecified: Secondary | ICD-10-CM

## 2016-09-26 LAB — LIPID PANEL
CHOL/HDL RATIO: 2.5 ratio (ref <=5.0)
CHOLESTEROL: 172 mg/dL (ref 125–200)
HDL: 70 mg/dL (ref >=46)
LDL Cholesterol: 83 mg/dL (ref <130)
Triglycerides: 97 mg/dL (ref <150)
VLDL: 19 mg/dL (ref <30)

## 2016-09-26 LAB — BASIC METABOLIC PANEL WITH GFR
BUN: 10 mg/dL (ref 7–25)
CO2: 25 mmol/L (ref 20–31)
Calcium: 9.7 mg/dL (ref 8.6–10.2)
Chloride: 104 mmol/L (ref 98–110)
Creat: 0.72 mg/dL (ref 0.50–1.10)
Glucose, Bld: 81 mg/dL (ref 65–99)
POTASSIUM: 3.7 mmol/L (ref 3.5–5.3)
Sodium: 138 mmol/L (ref 135–146)

## 2016-09-26 LAB — CBC WITH DIFFERENTIAL/PLATELET
BASOS PCT: 1 %
Basophils Absolute: 59 cells/uL (ref 0–200)
EOS PCT: 1 %
Eosinophils Absolute: 59 cells/uL (ref 15–500)
HCT: 37.9 % (ref 35.0–45.0)
Hemoglobin: 12.1 g/dL (ref 11.7–15.5)
LYMPHS PCT: 33 %
Lymphs Abs: 1947 cells/uL (ref 850–3900)
MCH: 25.3 pg — ABNORMAL LOW (ref 27.0–33.0)
MCHC: 31.9 g/dL — ABNORMAL LOW (ref 32.0–36.0)
MCV: 79.1 fL — ABNORMAL LOW (ref 80.0–100.0)
MONOS PCT: 3 %
Monocytes Absolute: 177 cells/uL — ABNORMAL LOW (ref 200–950)
Neutro Abs: 3658 cells/uL (ref 1500–7800)
Neutrophils Relative %: 62 %
PLATELETS: 221 10*3/uL (ref 140–400)
RBC: 4.79 MIL/uL (ref 3.80–5.10)
RDW: 17.5 % — AB (ref 11.0–15.0)
WBC: 5.9 10*3/uL (ref 3.8–10.8)

## 2016-09-26 LAB — POCT GLYCOSYLATED HEMOGLOBIN (HGB A1C): HEMOGLOBIN A1C: 5.3

## 2016-09-26 MED ORDER — LISINOPRIL-HYDROCHLOROTHIAZIDE 20-25 MG PO TABS: 1.0000 | ORAL_TABLET | Freq: Every day | ORAL | 3 refills | Status: DC

## 2016-09-26 MED ORDER — ESCITALOPRAM OXALATE 10 MG PO TABS: 10.0000 mg | ORAL_TABLET | Freq: Every day | ORAL | 1 refills | Status: DC

## 2016-09-26 NOTE — Progress Notes (Signed)
Pt is in the office today for establish care and bp check

## 2016-09-26 NOTE — Progress Notes (Addendum)
Sarah Myers, is a 45 y.o. female  [id number]  [id number]  DOB - 07/07/1971  CC:  Chief Complaint  Patient presents with  . Establish Care       HPI: Sarah Myers is a 45 y.o. female here today to establish medical care, w/ hx of HTN and gestational dm, last seen in clinic 11/16. Pt states her husband passed away about 2 years ago, and still sad and struggling with that.  She is currently taking care of her 16yo dgt, other kids grown and out of house, stressful job, living with her mom.  Gets tearful at times, no si/hi/avh.  Per pt, only taking 1 pill of hctz (12.5), did not know had to 2 take 2. Still taking lisinopril 20 qday. Has been seeing "spots" at times, but denies n/v/syncope/presyncope/cp/sob.  Walks daily, often.  Dietary noncompliance, eats fast food often. Does not smoke or drink etoh.  No hx of breast ca in family, due for Pap.  Patient has No headache, No chest pain, No abdominal pain - No Nausea, No new weakness tingling or numbness, No Cough - SOB.    Review of Systems:   No Known Allergies Past Medical History:  Diagnosis Date  . Hypertension    Current Outpatient Prescriptions on File Prior to Visit  Medication Sig Dispense Refill  . acetaminophen (TYLENOL) 500 MG tablet Take 1,000 mg by mouth every 6 (six) hours as needed for moderate pain or headache.     No current facility-administered medications on file prior to visit.    Family History  Problem Relation Age of Onset  . Diabetes Mother   . Diabetes Maternal Aunt   . Stroke Maternal Uncle   . Diabetes Maternal Grandmother   . Heart disease Maternal Grandmother   . Diabetes Maternal Grandfather   . Heart disease Maternal Grandfather    Social History   Social History  . Marital status: Married    Spouse name: N/A  . Number of children: N/A  . Years of education: N/A   Occupational History  . Not on file.   Social History Main Topics  . Smoking status: Never  Smoker  . Smokeless tobacco: Not on file  . Alcohol use 0.0 oz/week  . Drug use: No  . Sexual activity: No   Other Topics Concern  . Not on file   Social History Narrative  . No narrative on file    Objective:   Vitals:   09/26/16 1056  BP: (!) 165/94  Pulse: 65  Resp: 16  Temp: 98 F (36.7 C)    Filed Weights   09/26/16 1056  Weight: 143 lb 12.8 oz (65.2 kg)    BP Readings from Last 3 Encounters:  09/26/16 (!) 165/94  06/09/16 (!) 206/109  11/03/15 (!) 153/102    Physical Exam: Constitutional: Patient appears well-developed and well-nourished. No distress. AAOx3, pleasant. HENT: Normocephalic, atraumatic, External right and left ear normal. Oropharynx is clear and moist.  bilat TMs clear. Eyes: Conjunctivae and EOM are normal. PERRL, no scleral icterus. Neck: Normal ROM. Neck supple. No JVD.  CVS: RRR, S1/S2 +, no murmurs, no gallops, no carotid bruit.  Pulmonary: Effort and breath sounds normal, no stridor, rhonchi, wheezes, rales.  Abdominal: Soft. BS +, no distension, tenderness, rebound or guarding.  Musculoskeletal: Normal range of motion. No edema and no tenderness.  LE: bilat/ no c/c/e, pulses 2+ bilateral. Neuro: Alert.  muscle tone coordination wnl. No cranial nerve deficit grossly. Skin: Skin is warm  and dry. No rash noted. Not diaphoretic. No erythema. No pallor. Psychiatric: Normal mood and affect. Behavior, judgment, thought content normal.  Lab Results  Component Value Date   WBC 7.5 09/20/2015   HGB 12.9 06/09/2016   HCT 38.0 06/09/2016   MCV 61.2 (L) 09/20/2015   PLT 248 09/20/2015   Lab Results  Component Value Date   CREATININE 0.80 06/09/2016   BUN 14 06/09/2016   NA 138 06/09/2016   K 3.8 06/09/2016   CL 105 06/09/2016   CO2 24 09/20/2015    No results found for: HGBA1C Lipid Panel  No results found for: CHOL, TRIG, HDL, CHOLHDL, VLDL, LDLCALC     Depression screen [facility] 2/9 09/26/2016 12/23/2014  Decreased Interest 3 0  Down,  Depressed, Hopeless 1 1  PHQ - 2 Score 4 1  Altered sleeping 0 -  Tired, decreased energy 1 -  Change in appetite 0 -  Feeling bad or failure about yourself  2 -  Trouble concentrating 0 -  Moving slowly or fidgety/restless 0 -  Suicidal thoughts 0 -  PHQ-9 Score 7 -    Assessment and plan:   1. HTN (hypertension), benign DASH diet discussed, decrease eating out - chg bp meds to prinzide 20/25 qday - CBC with Differential/Platelet - BASIC METABOLIC PANEL WITH GFR - Lipid Panel - reck bp next time here - pt declined eye referral for now.  2. Diabetes mellitus screening H/o gestational dm. - HgB A1c 5.3  3. Mild depression, no si/hi/avh - trial lexapro 10 qhs  4. tdap today Pt declined flu shot  Return in about 3 weeks (around 10/17/2016) for pap. /htn/depression.  The patient was given clear instructions to go to ER or return to medical center if symptoms don't improve, worsen or new problems develop. The patient verbalized understanding. The patient was told to call to get lab results if they haven't heard anything in the next week.    This note has been created with Education officer, environmental. Any transcriptional errors are unintentional.   Pete Glatter, MD, MBA/MHA [facility] [place], [place] [phone]   09/26/2016, 11:24 AM

## 2016-09-26 NOTE — Patient Instructions (Addendum)
DASH Eating Plan DASH stands for "Dietary Approaches to Stop Hypertension." The DASH eating plan is a healthy eating plan that has been shown to reduce high blood pressure (hypertension). Additional health benefits may include reducing the risk of type 2 diabetes mellitus, heart disease, and stroke. The DASH eating plan may also help with weight loss. WHAT DO I NEED TO KNOW ABOUT THE DASH EATING PLAN? For the DASH eating plan, you will follow these general guidelines:  Choose foods with a percent daily value for sodium of less than 5% (as listed on the food label).  Use salt-free seasonings or herbs instead of table salt or sea salt.  Check with your health care provider or pharmacist before using salt substitutes.  Eat lower-sodium products, often labeled as "lower sodium" or "no salt added."  Eat fresh foods.  Eat more vegetables, fruits, and low-fat dairy products.  Choose whole grains. Look for the word "whole" as the first word in the ingredient list.  Choose fish and skinless chicken or [place] more often than red meat. Limit fish, poultry, and meat to 6 oz (170 g) each day.  Limit sweets, desserts, sugars, and sugary drinks.  Choose heart-healthy fats.  Limit cheese to 1 oz (28 g) per day.  Eat more home-cooked food and less restaurant, buffet, and fast food.  Limit fried foods.  Cook foods using methods other than frying.  Limit canned vegetables. If you do use them, rinse them well to decrease the sodium.  When eating at a restaurant, ask that your food be prepared with less salt, or no salt if possible. WHAT FOODS CAN I EAT? Seek help from a dietitian for individual calorie needs. Grains Whole grain or whole wheat bread. Brown rice. Whole grain or whole wheat pasta. Quinoa, bulgur, and whole grain cereals. Low-sodium cereals. Corn or whole wheat flour tortillas. Whole grain cornbread. Whole grain crackers. Low-sodium crackers. Vegetables Fresh or frozen vegetables  (raw, steamed, roasted, or grilled). Low-sodium or reduced-sodium tomato and vegetable juices. Low-sodium or reduced-sodium tomato sauce and paste. Low-sodium or reduced-sodium canned vegetables.  Fruits All fresh, canned (in natural juice), or frozen fruits. Meat and Other Protein Products Ground beef (85% or leaner), grass-fed beef, or beef trimmed of fat. Skinless chicken or [place]. Ground chicken or [place]. Pork trimmed of fat. All fish and seafood. Eggs. Dried beans, peas, or lentils. Unsalted nuts and seeds. Unsalted canned beans. Dairy Low-fat dairy products, such as skim or 1% milk, 2% or reduced-fat cheeses, low-fat ricotta or cottage cheese, or plain low-fat yogurt. Low-sodium or reduced-sodium cheeses. Fats and Oils Tub margarines without trans fats. Light or reduced-fat mayonnaise and salad dressings (reduced sodium). Avocado. Safflower, olive, or canola oils. Natural peanut or almond butter. Other Unsalted popcorn and pretzels. The items listed above may not be a complete list of recommended foods or beverages. Contact your dietitian for more options. WHAT FOODS ARE NOT RECOMMENDED? Grains White bread. White pasta. White rice. Refined cornbread. Bagels and croissants. Crackers that contain trans fat. Vegetables Creamed or fried vegetables. Vegetables in a cheese sauce. Regular canned vegetables. Regular canned tomato sauce and paste. Regular tomato and vegetable juices. Fruits Dried fruits. Canned fruit in light or heavy syrup. Fruit juice. Meat and Other Protein Products Fatty cuts of meat. Ribs, chicken wings, bacon, sausage, bologna, salami, chitterlings, fatback, hot dogs, bratwurst, and packaged luncheon meats. Salted nuts and seeds. Canned beans with salt. Dairy Whole or 2% milk, cream, half-and-half, and cream cheese. Whole-fat or sweetened yogurt. Full-fat  cheeses or blue cheese. Nondairy creamers and whipped toppings. Processed cheese, cheese spreads, or cheese  curds. Condiments Onion and garlic salt, seasoned salt, table salt, and sea salt. Canned and packaged gravies. Worcestershire sauce. Tartar sauce. Barbecue sauce. Teriyaki sauce. Soy sauce, including reduced sodium. Steak sauce. Fish sauce. Oyster sauce. Cocktail sauce. Horseradish. Ketchup and mustard. Meat flavorings and tenderizers. Bouillon cubes. Hot sauce. Tabasco sauce. Marinades. Taco seasonings. Relishes. Fats and Oils Butter, stick margarine, lard, shortening, ghee, and bacon fat. Coconut, palm kernel, or palm oils. Regular salad dressings. Other Pickles and olives. Salted popcorn and pretzels. The items listed above may not be a complete list of foods and beverages to avoid. Contact your dietitian for more information. WHERE CAN I FIND MORE INFORMATION? National Heart, Lung, and [employer] Institute: travelstabloid.com   This information is not intended to replace advice given to you by your health care provider. Make sure you discuss any questions you have with your health care provider.   Document Released: 11/23/2011 Document Revised: 12/25/2014 Document Reviewed: 10/08/2013 Elsevier Interactive Patient Education 2016 [employer] Inc[employer]  -  Hypertension Hypertension is another name for high blood pressure. High blood pressure forces your heart to work harder to pump blood. A blood pressure reading has two numbers, which includes a higher number over a lower number (example: 110/72). HOME CARE   Have your blood pressure rechecked by your doctor.  Only take medicine as told by your doctor. Follow the directions carefully. The medicine does not work as well if you skip doses. Skipping doses also puts you at risk for problems.  Do not smoke.  Monitor your blood pressure at home as told by your doctor. GET HELP IF:  You think you are having a reaction to the medicine you are taking.  You have repeat headaches or feel dizzy.  You have puffiness  (swelling) in your ankles.  You have trouble with your vision. GET HELP RIGHT AWAY IF:   You get a very bad headache and are confused.  You feel weak, numb, or faint.  You get chest or belly (abdominal) pain.  You throw up (vomit).  You cannot breathe very well. MAKE SURE YOU:   Understand these instructions.  Will watch your condition.  Will get help right away if you are not doing well or get worse.   This information is not intended to replace advice given to you by your health care provider. Make sure you discuss any questions you have with your health care provider.   Document Released: 05/22/2008 Document Revised: 12/09/2013 Document Reviewed: 09/26/2013 Elsevier Interactive Patient Education 2016 [place] Maintenance, Female Adopting a healthy lifestyle and getting preventive care can go a long way to promote health and wellness. Talk with your health care provider about what schedule of regular examinations is right for you. This is a good chance for you to check in with your provider about disease prevention and staying healthy. In between checkups, there are plenty of things you can do on your own. Experts have done a lot of research about which lifestyle changes and preventive measures are most likely to keep you healthy. Ask your health care provider for more information. WEIGHT AND DIET  Eat a healthy diet  Be sure to include plenty of vegetables, fruits, low-fat dairy products, and lean protein.  Do not eat a lot of foods high in solid fats, added sugars, or salt.  Get regular exercise. This is one of the most important things  you can do for your health.  Most adults should exercise for at least 150 minutes each week. The exercise should increase your heart rate and make you sweat (moderate-intensity exercise).  Most adults should also do strengthening exercises at least twice a week. This is in addition to the moderate-intensity exercise.   Maintain a healthy weight  Body mass index (BMI) is a measurement that can be used to identify possible weight problems. It estimates body fat based on height and weight. Your health care provider can help determine your BMI and help you achieve or maintain a healthy weight.  For females 34 years of age and older:   A BMI below 18.5 is considered underweight.  A BMI of 18.5 to 24.9 is normal.  A BMI of 25 to 29.9 is considered overweight.  A BMI of 30 and above is considered obese.  Watch levels of cholesterol and blood lipids  You should start having your blood tested for lipids and cholesterol at 44 years of age, then have this test every 5 years.  You may need to have your cholesterol levels checked more often if:  Your lipid or cholesterol levels are high.  You are older than 44 years of age.  You are at high risk for heart disease.  CANCER SCREENING   Lung Cancer  Lung cancer screening is recommended for adults 13-44 years old who are at high risk for lung cancer because of a history of smoking.  A yearly low-dose CT scan of the lungs is recommended for people who:  Currently smoke.  Have quit within the past 15 years.  Have at least a 30-pack-year history of smoking. A pack year is smoking an average of one pack of cigarettes a day for 1 year.  Yearly screening should continue until it has been 15 years since you quit.  Yearly screening should stop if you develop a health problem that would prevent you from having lung cancer treatment.  Breast Cancer  Practice breast self-awareness. This means understanding how your breasts normally appear and feel.  It also means doing regular breast self-exams. Let your health care provider know about any changes, no matter how small.  If you are in your 20s or 30s, you should have a clinical breast exam (CBE) by a health care provider every 1-3 years as part of a regular health exam.  If you are 30 or older, have a  CBE every year. Also consider having a breast X-ray (mammogram) every year.  If you have a family history of breast cancer, talk to your health care provider about genetic screening.  If you are at high risk for breast cancer, talk to your health care provider about having an MRI and a mammogram every year.  Breast cancer gene (BRCA) assessment is recommended for women who have family members with BRCA-related cancers. BRCA-related cancers include:  Breast.  Ovarian.  Tubal.  Peritoneal cancers.  Results of the assessment will determine the need for genetic counseling and BRCA1 and BRCA2 testing. Cervical Cancer Your health care provider may recommend that you be screened regularly for cancer of the pelvic organs (ovaries, uterus, and vagina). This screening involves a pelvic examination, including checking for microscopic changes to the surface of your cervix (Pap test). You may be encouraged to have this screening done every 3 years, beginning at age 75.  For women ages 54-65, health care providers may recommend pelvic exams and Pap testing every 3 years, or they may recommend the  Pap and pelvic exam, combined with testing for human papilloma virus (HPV), every 5 years. Some types of HPV increase your risk of cervical cancer. Testing for HPV may also be done on women of any age with unclear Pap test results.  Other health care providers may not recommend any screening for nonpregnant women who are considered low risk for pelvic cancer and who do not have symptoms. Ask your health care provider if a screening pelvic exam is right for you.  If you have had past treatment for cervical cancer or a condition that could lead to cancer, you need Pap tests and screening for cancer for at least 20 years after your treatment. If Pap tests have been discontinued, your risk factors (such as having a new sexual partner) need to be reassessed to determine if screening should resume. Some women have  medical problems that increase the chance of getting cervical cancer. In these cases, your health care provider may recommend more frequent screening and Pap tests. Colorectal Cancer  This type of cancer can be detected and often prevented.  Routine colorectal cancer screening usually begins at 44 years of age and continues through 44 years of age.  Your health care provider may recommend screening at an earlier age if you have risk factors for colon cancer.  Your health care provider may also recommend using home test kits to check for hidden blood in the stool.  A small camera at the end of a tube can be used to examine your colon directly (sigmoidoscopy or colonoscopy). This is done to check for the earliest forms of colorectal cancer.  Routine screening usually begins at age 80.  Direct examination of the colon should be repeated every 5-10 years through 44 years of age. However, you may need to be screened more often if early forms of precancerous polyps or small growths are found. Skin Cancer  Check your skin from head to toe regularly.  Tell your health care provider about any new moles or changes in moles, especially if there is a change in a mole's shape or color.  Also tell your health care provider if you have a mole that is larger than the size of a pencil eraser.  Always use sunscreen. Apply sunscreen liberally and repeatedly throughout the day.  Protect yourself by wearing long sleeves, pants, a wide-brimmed hat, and sunglasses whenever you are outside. HEART DISEASE, DIABETES, AND HIGH BLOOD PRESSURE   High blood pressure causes heart disease and increases the risk of stroke. High blood pressure is more likely to develop in:  People who have blood pressure in the high end of the normal range (130-139/85-89 mm Hg).  People who are overweight or obese.  People who are African American.  If you are 79-72 years of age, have your blood pressure checked every 3-5 years.  If you are 54 years of age or older, have your blood pressure checked every year. You should have your blood pressure measured twice--once when you are at a hospital or clinic, and once when you are not at a hospital or clinic. Record the average of the two measurements. To check your blood pressure when you are not at a hospital or clinic, you can use:  An automated blood pressure machine at a pharmacy.  A home blood pressure monitor.  If you are between 66 years and 58 years old, ask your health care provider if you should take aspirin to prevent strokes.  Have regular diabetes screenings. This involves  taking a blood sample to check your fasting blood sugar level.  If you are at a normal weight and have a low risk for diabetes, have this test once every three years after 44 years of age.  If you are overweight and have a high risk for diabetes, consider being tested at a younger age or more often. PREVENTING INFECTION  Hepatitis B  If you have a higher risk for hepatitis B, you should be screened for this virus. You are considered at high risk for hepatitis B if:  You were born in a country where hepatitis B is common. Ask your health care provider which countries are considered high risk.  Your parents were born in a high-risk country, and you have not been immunized against hepatitis B (hepatitis B vaccine).  You have HIV or AIDS.  You use needles to inject street drugs.  You live with someone who has hepatitis B.  You have had sex with someone who has hepatitis B.  You get hemodialysis treatment.  You take certain medicines for conditions, including cancer, organ transplantation, and autoimmune conditions. Hepatitis C  Blood testing is recommended for:  Everyone born from [birth year] through 1964.  Anyone with known risk factors for hepatitis C. Sexually transmitted infections (STIs)  You should be screened for sexually transmitted infections (STIs) including gonorrhea and  chlamydia if:  You are sexually active and are younger than 44 years of age.  You are older than 44 years of age and your health care provider tells you that you are at risk for this type of infection.  Your sexual activity has changed since you were last screened and you are at an increased risk for chlamydia or gonorrhea. Ask your health care provider if you are at risk.  If you do not have HIV, but are at risk, it may be recommended that you take a prescription medicine daily to prevent HIV infection. This is called pre-exposure prophylaxis (PrEP). You are considered at risk if:  You are sexually active and do not regularly use condoms or know the HIV status of your partner(s).  You take drugs by injection.  You are sexually active with a partner who has HIV. Talk with your health care provider about whether you are at high risk of being infected with HIV. If you choose to begin PrEP, you should first be tested for HIV. You should then be tested every 3 months for as long as you are taking PrEP.  PREGNANCY   If you are premenopausal and you may become pregnant, ask your health care provider about preconception counseling.  If you may become pregnant, take 400 to 800 micrograms (mcg) of folic acid every day.  If you want to prevent pregnancy, talk to your health care provider about birth control (contraception). OSTEOPOROSIS AND MENOPAUSE   Osteoporosis is a disease in which the bones lose minerals and strength with aging. This can result in serious bone fractures. Your risk for osteoporosis can be identified using a bone density scan.  If you are 78 years of age or older, or if you are at risk for osteoporosis and fractures, ask your health care provider if you should be screened.  Ask your health care provider whether you should take a calcium or vitamin D supplement to lower your risk for osteoporosis.  Menopause may have certain physical symptoms and risks.  Hormone replacement  therapy may reduce some of these symptoms and risks. Talk to your health care provider about whether  hormone replacement therapy is right for you.  HOME CARE INSTRUCTIONS   Schedule regular health, dental, and eye exams.  Stay current with your immunizations.   Do not use any tobacco products including cigarettes, chewing tobacco, or electronic cigarettes.  If you are pregnant, do not drink alcohol.  If you are breastfeeding, limit how much and how often you drink alcohol.  Limit alcohol intake to no more than 1 drink per day for nonpregnant women. One drink equals 12 ounces of beer, 5 ounces of wine, or 1 ounces of hard liquor.  Do not use street drugs.  Do not share needles.  Ask your health care provider for help if you need support or information about quitting drugs.  Tell your health care provider if you often feel depressed.  Tell your health care provider if you have ever been abused or do not feel safe at home.   This information is not intended to replace advice given to you by your health care provider. Make sure you discuss any questions you have with your health care provider.   Document Released: 06/19/2011 Document Revised: 12/25/2014 Document Reviewed: 11/05/2013 Elsevier Interactive Patient Education 2016 Else[employer] Inc.   - Escitalopram tablets What is this medicine? ESCITALOPRAM (es sye TAL oh pram) is used to treat depression and certain types of anxiety. This medicine may be used for other purposes; ask your health care provider or pharmacist if you have questions. What should I tell my health care provider before I take this medicine? They need to know if you have any of these conditions: -bipolar disorder or a family history of bipolar disorder -diabetes -glaucoma -heart disease -kidney or liver disease -receiving electroconvulsive therapy -seizures (convulsions) -suicidal thoughts, plans, or attempt by you or a family member -an unusual or  allergic reaction to escitalopram, the related drug citalopram, other medicines, foods, dyes, or preservatives -pregnant or trying to become pregnant -breast-feeding How should I use this medicine? Take this medicine by mouth with a glass of water. Follow the directions on the prescription label. You can take it with or without food. If it upsets your stomach, take it with food. Take your medicine at regular intervals. Do not take it more often than directed. Do not stop taking this medicine suddenly except upon the advice of your doctor. Stopping this medicine too quickly may cause serious side effects or your condition may worsen. A special MedGuide will be given to you by the pharmacist with each prescription and refill. Be sure to read this information carefully each time. Talk to your pediatrician regarding the use of this medicine in children. Special care may be needed. Overdosage: If you think you have taken too much of this medicine contact a poison control center or emergency room at once. NOTE: This medicine is only for you. Do not share this medicine with others. What if I miss a dose? If you miss a dose, take it as soon as you can. If it is almost time for your next dose, take only that dose. Do not take double or extra doses. What may interact with this medicine? Do not take this medicine with any of the following medications: -certain medicines for fungal infections like fluconazole, itraconazole, ketoconazole, posaconazole, voriconazole -cisapride -citalopram -dofetilide -dronedarone -linezolid -MAOIs like Carbex, Eldepryl, Marplan, Nardil, and Parnate -methylene blue (injected into a vein) -pimozide -thioridazine -ziprasidone This medicine may also interact with the following medications: -alcohol -aspirin and aspirin-like medicines -carbamazepine -certain medicines for depression, anxiety, or psychotic  disturbances -certain medicines for migraine headache like  almotriptan, eletriptan, frovatriptan, naratriptan, rizatriptan, sumatriptan, zolmitriptan -certain medicines for sleep -certain medicines that treat or prevent blood clots like warfarin, enoxaparin, dalteparin -cimetidine -diuretics -fentanyl -furazolidone -isoniazid -lithium -metoprolol -NSAIDs, medicines for pain and inflammation, like ibuprofen or naproxen -other medicines that prolong the QT interval (cause an abnormal heart rhythm) -procarbazine -rasagiline -supplements like St. John's wort, kava kava, valerian -tramadol -tryptophan This list may not describe all possible interactions. Give your health care provider a list of all the medicines, herbs, non-prescription drugs, or dietary supplements you use. Also tell them if you smoke, drink alcohol, or use illegal drugs. Some items may interact with your medicine. What should I watch for while using this medicine? Tell your doctor if your symptoms do not get better or if they get worse. Visit your doctor or health care professional for regular checks on your progress. Because it may take several weeks to see the full effects of this medicine, it is important to continue your treatment as prescribed by your doctor. Patients and their families should watch out for new or worsening thoughts of suicide or depression. Also watch out for sudden changes in feelings such as feeling anxious, agitated, panicky, irritable, hostile, aggressive, impulsive, severely restless, overly excited and hyperactive, or not being able to sleep. If this happens, especially at the beginning of treatment or after a change in dose, call your health care professional. Dennis Bast may get drowsy or dizzy. Do not drive, use machinery, or do anything that needs mental alertness until you know how this medicine affects you. Do not stand or sit up quickly, especially if you are an older patient. This reduces the risk of dizzy or fainting spells. Alcohol may interfere with the  effect of this medicine. Avoid alcoholic drinks. Your mouth may get dry. Chewing sugarless gum or sucking hard candy, and drinking plenty of water may help. Contact your doctor if the problem does not go away or is severe. What side effects may I notice from receiving this medicine? Side effects that you should report to your doctor or health care professional as soon as possible: -allergic reactions like skin rash, itching or hives, swelling of the face, lips, or tongue -confusion -feeling faint or lightheaded, falls -fast talking and excited feelings or actions that are out of control -hallucination, loss of contact with reality -seizures -suicidal thoughts or other mood changes -unusual bleeding or bruising Side effects that usually do not require medical attention (report to your doctor or health care professional if they continue or are bothersome): -blurred vision -changes in appetite -change in sex drive or performance -headache -increased sweating -nausea This list may not describe all possible side effects. Call your doctor for medical advice about side effects. You may report side effects to FDA at 1-800-FDA-1088. Where should I keep my medicine? Keep out of reach of children. Store at room temperature between 15 and 30 degrees C (59 and 86 degrees F). Throw away any unused medicine after the expiration date. NOTE: This sheet is a summary. It may not cover all possible information. If you have questions about this medicine, talk to your doctor, pharmacist, or health care provider.    2016, Elsevier/Gold Standard. (2013-07-01 12:32:55) Tdap Vaccine (Tetanus, Diphtheria and Pertussis): What You Need to Know 1. Why get vaccinated? Tetanus, diphtheria and pertussis are very serious diseases. Tdap vaccine can protect [place] from these diseases. And, Tdap vaccine given to pregnant women can protect newborn babies against pertussis.  TETANUS (Lockjaw) is rare in the [place] today.  It causes painful muscle tightening and stiffness, usually all over the body.  It can lead to tightening of muscles in the head and neck so you can't open your mouth, swallow, or sometimes even breathe. Tetanus kills about 1 out of 10 people who are infected even after receiving the best medical care. DIPHTHERIA is also rare in the [place] today. It can cause a thick coating to form in the back of the throat.  It can lead to breathing problems, heart failure, paralysis, and death. PERTUSSIS (Whooping Cough) causes severe coughing spells, which can cause difficulty breathing, vomiting and disturbed sleep.  It can also lead to weight loss, incontinence, and rib fractures. Up to 2 in 100 adolescents and 5 in 100 adults with pertussis are hospitalized or have complications, which could include pneumonia or death. These diseases are caused by bacteria. Diphtheria and pertussis are spread from person to person through secretions from coughing or sneezing. Tetanus enters the body through cuts, scratches, or wounds. Before vaccines, as many as 200,000 cases of diphtheria, 200,000 cases of pertussis, and hundreds of cases of tetanus, were reported in the [place] each year. Since vaccination began, reports of cases for tetanus and diphtheria have dropped by about 99% and for pertussis by about 80%. 2. Tdap vaccine Tdap vaccine can protect adolescents and adults from tetanus, diphtheria, and pertussis. One dose of Tdap is routinely given at age 54 or 51. People who did not get Tdap at that age should get it as soon as possible. Tdap is especially important for healthcare professionals and anyone having close contact with a baby younger than 12 months. Pregnant women should get a dose of Tdap during every pregnancy, to protect the newborn from pertussis. Infants are most at risk for severe, life-threatening complications from pertussis. Another vaccine, called Td, protects against tetanus and  diphtheria, but not pertussis. A Td booster should be given every 10 years. Tdap may be given as one of these boosters if you have never gotten Tdap before. Tdap may also be given after a severe cut or burn to prevent tetanus infection. Your doctor or the person giving you the vaccine can give you more information. Tdap may safely be given at the same time as other vaccines. 3. Some people should not get this vaccine  A person who has ever had a life-threatening allergic reaction after a previous dose of any diphtheria, tetanus or pertussis containing vaccine, OR has a severe allergy to any part of this vaccine, should not get Tdap vaccine. Tell the person giving the vaccine about any severe allergies.  Anyone who had coma or long repeated seizures within 7 days after a childhood dose of DTP or DTaP, or a previous dose of Tdap, should not get Tdap, unless a cause other than the vaccine was found. They can still get Td.  Talk to your doctor if you:  have seizures or another nervous system problem,  had severe pain or swelling after any vaccine containing diphtheria, tetanus or pertussis,  ever had a condition called Guillain-Barr Syndrome (GBS),  aren't feeling well on the day the shot is scheduled. 4. Risks With any medicine, including vaccines, there is a chance of side effects. These are usually mild and go away on their own. Serious reactions are also possible but are rare. Most people who get Tdap vaccine do not have any problems with it. Mild problems following Tdap (Did not interfere  with activities)  Pain where the shot was given (about 3 in 4 adolescents or 2 in 3 adults)  Redness or swelling where the shot was given (about 1 person in 5)  Mild fever of at least 100.43F (up to about 1 in 25 adolescents or 1 in 100 adults)  Headache (about 3 or 4 people in 10)  Tiredness (about 1 person in 3 or 4)  Nausea, vomiting, diarrhea, stomach ache (up to 1 in 4 adolescents or 1 in 10  adults)  Chills, sore joints (about 1 person in 10)  Body aches (about 1 person in 3 or 4)  Rash, swollen glands (uncommon) Moderate problems following Tdap (Interfered with activities, but did not require medical attention)  Pain where the shot was given (up to 1 in 5 or 6)  Redness or swelling where the shot was given (up to about 1 in 16 adolescents or 1 in 12 adults)  Fever over 102F (about 1 in 100 adolescents or 1 in 250 adults)  Headache (about 1 in 7 adolescents or 1 in 10 adults)  Nausea, vomiting, diarrhea, stomach ache (up to 1 or 3 people in 100)  Swelling of the entire arm where the shot was given (up to about 1 in 500). Severe problems following Tdap (Unable to perform usual activities; required medical attention)  Swelling, severe pain, bleeding and redness in the arm where the shot was given (rare). Problems that could happen after any vaccine:  People sometimes faint after a medical procedure, including vaccination. Sitting or lying down for about 15 minutes can help prevent fainting, and injuries caused by a fall. Tell your doctor if you feel dizzy, or have vision changes or ringing in the ears.  Some people get severe pain in the shoulder and have difficulty moving the arm where a shot was given. This happens very rarely.  Any medication can cause a severe allergic reaction. Such reactions from a vaccine are very rare, estimated at fewer than 1 in a million doses, and would happen within a few minutes to a few hours after the vaccination. As with any medicine, there is a very remote chance of a vaccine causing a serious injury or death. The safety of vaccines is always being monitored. For more information, visit: http://www.aguilar.org/ 5. What if there is a serious problem? What should I look for?  Look for anything that concerns you, such as signs of a severe allergic reaction, very high fever, or unusual behavior.  Signs of a severe allergic reaction  can include hives, swelling of the face and throat, difficulty breathing, a fast heartbeat, dizziness, and weakness. These would usually start a few minutes to a few hours after the vaccination. What should I do?  If you think it is a severe allergic reaction or other emergency that can't wait, call 9-1-1 or get the person to the nearest hospital. Otherwise, call your doctor.  Afterward, the reaction should be reported to the Vaccine Adverse Event Reporting System (VAERS). Your doctor might file this report, or you can do it yourself through the VAERS web site at www.vaers.SamedayNews.es, or by calling (337)378-8851. VAERS does not give medical advice.  6. The National Vaccine Injury Compensation Program The [employer] Vaccine Injury Compensation Program (VICP) is a federal program that was created to compensate people who may have been injured by certain vaccines. Persons who believe they may have been injured by a vaccine can learn about the program and about filing a claim by calling 775 083 0098  or visiting the [place] website at GoldCloset.com.ee. There is a time limit to file a claim for compensation. 7. How can I learn more?  Ask your doctor. He or she can give you the vaccine package insert or suggest other sources of information.  Call your local or state health department.  Contact the Centers for Disease Control and Prevention (CDC):  Call (859)104-5241 (1-800-CDC-INFO) or  Visit CDC's website at http://hunter.com/ CDC Tdap Vaccine VIS (02/10/14)   This information is not intended to replace advice given to you by your health care provider. Make sure you discuss any questions you have with your health care provider.   Document Released: 06/04/2012 Document Revised: 12/25/2014 Document Reviewed: 03/18/2014 Elsevier Interactive Patient Education [employer].

## 2016-09-28 ENCOUNTER — Telehealth: Payer: Self-pay

## 2016-09-28 NOTE — Telephone Encounter (Signed)
Contacted pt to go over lab results pt didn't answer lvm for pt to give me a call at her earliest convenience  

## 2016-10-17 ENCOUNTER — Ambulatory Visit: Payer: Medicaid Other | Attending: Internal Medicine | Admitting: Internal Medicine

## 2016-10-17 ENCOUNTER — Encounter: Payer: Self-pay | Admitting: Internal Medicine

## 2016-10-17 VITALS — BP 152/90 | HR 71 | Temp 98.4°F | Ht 63.0 in | Wt 147.8 lb

## 2016-10-17 DIAGNOSIS — I10 Essential (primary) hypertension: Secondary | ICD-10-CM | POA: Diagnosis not present

## 2016-10-17 DIAGNOSIS — Z79899 Other long term (current) drug therapy: Secondary | ICD-10-CM | POA: Insufficient documentation

## 2016-10-17 DIAGNOSIS — Z23 Encounter for immunization: Secondary | ICD-10-CM | POA: Insufficient documentation

## 2016-10-17 DIAGNOSIS — N841 Polyp of cervix uteri: Secondary | ICD-10-CM | POA: Diagnosis not present

## 2016-10-17 DIAGNOSIS — N888 Other specified noninflammatory disorders of cervix uteri: Secondary | ICD-10-CM | POA: Insufficient documentation

## 2016-10-17 DIAGNOSIS — F329 Major depressive disorder, single episode, unspecified: Secondary | ICD-10-CM | POA: Insufficient documentation

## 2016-10-17 DIAGNOSIS — R896 Abnormal cytological findings in specimens from other organs, systems and tissues: Secondary | ICD-10-CM | POA: Diagnosis not present

## 2016-10-17 DIAGNOSIS — F32A Depression, unspecified: Secondary | ICD-10-CM

## 2016-10-17 DIAGNOSIS — Z124 Encounter for screening for malignant neoplasm of cervix: Secondary | ICD-10-CM

## 2016-10-17 MED ORDER — ESCITALOPRAM OXALATE 10 MG PO TABS: 10.0000 mg | ORAL_TABLET | Freq: Every day | ORAL | 1 refills | Status: DC

## 2016-10-17 MED FILL — ESCITALOPRAM 10 MG TABLET: 10 | 30 days supply | Qty: 30 | Fill #0

## 2016-10-17 NOTE — Progress Notes (Signed)
Sarah Myers Gipe, is a 45 y.o. female  NGE:[id number]  [id number]  DOB - 07/15/1971  Chief Complaint  Patient presents with  . Abnormal Pap Smear        Subjective:   Sarah Myers Zimmers is a 45 y.o. female here today for a follow up visit.  Still  W/ a lot of stress at work, just picked up her bp rx this wkend and started it Sunday. Doing bit better watching her salt intake, less fast food.  Last menses 2 wks ago, not sexually active currently. No hx of breast cancer in family.  Pt has hx of benign breast mast sp lumpectomy in 1990s while she was pregnant at time, no f/u since. Denies abnml nipple discharge. Denies abnml vag discharge.   Patient has No headache, No chest pain, No abdominal pain - No Nausea, No new weakness tingling or numbness, No Cough - SOB.  No problems updated.  ALLERGIES: No Known Allergies  PAST MEDICAL HISTORY: Past Medical History:  Diagnosis Date  . Hypertension     MEDICATIONS AT HOME: Prior to Admission medications   Medication Sig Start Date End Date Taking? Authorizing Provider  acetaminophen (TYLENOL) 500 MG tablet Take 1,000 mg by mouth every 6 (six) hours as needed for moderate pain or headache.   Yes Historical Provider, MD  lisinopril-hydrochlorothiazide (PRINZIDE,ZESTORETIC) 20-25 MG tablet Take 1 tablet by mouth daily. 09/26/16  Yes Pete Glatterawn T Jade Burright, MD  escitalopram (LEXAPRO) 10 MG tablet Take 1 tablet (10 mg total) by mouth daily. To start; take 1/2 tab daily x 4 days, than take full tab daily. 10/17/16   Pete Glatterawn T Velmer Broadfoot, MD     Objective:   Vitals:   10/17/16 1141 10/17/16 1154  BP: (!) 178/100 (!) 152/90  Pulse: 71   Temp: 98.4 F (36.9 C)   TempSrc: Oral   SpO2: 100%   Weight: 147 lb 12.8 oz (67 kg)   Height: 5\' 3"  (1.6 m)     Exam General appearance : Awake, alert, not in any distress. Speech Clear. Not toxic looking HEENT: Atraumatic and Normocephalic, pupils equally reactive to light. Neck: supple, no JVD.  No cervical lymphadenopathy.  Chest:Good air entry bilaterally, no added sounds. Breast /axilla: bilat nml appearance, not dippling noted. No palpable masses/nodules/nipple discharge noted on exam  CVS: S1 S2 regular, no murmurs/gallups or rubs. Abdomen: Bowel sounds active, Non tender and not distended with no gaurding, rigidity or rebound. Pelvic Exam: Cervix with likely nabothian cyst, wide open os, w/ polyp noted, and easily friable surface, external genitalia normal with white nodular cyst around labia, nttp, no adnexal masses or tenderness, no cervical motion tenderness, rectovaginal septum normal, uterus normal size, shape, and consistency and vagina normal with white discharge    Extremities: B/L Lower Ext shows no edema, both legs are warm to touch Neurology: Awake alert, and oriented X 3, CN II-XII grossly intact, Non focal Skin:No Rash  Data Review Lab Results  Component Value Date   HGBA1C 5.3 09/26/2016    [facility] 10/17/2016 09/26/2016 12/23/2014  Decreased Interest 2 3 0  Down, Depressed, Hopeless 3 1 1   PHQ - 2 Score 5 4 1   Altered sleeping 1 0 -  Tired, decreased energy 2 1 -  Change in appetite 1 0 -  Feeling bad or failure about yourself  1 2 -  Trouble concentrating 2 0 -  Moving slowly or fidgety/restless 0 0 -  Suicidal thoughts 0 0 -  PHQ-9 Score  12 7 -      Assessment & Plan   1. Pap smear for cervical cancer screening - Cytology - PAP  2. HTN (hypertension), malignant Repeat bit better, 152/90 manual done by me - dash diet, continue to take meds.  3. Depression, unspecified depression type Unable to pick up lexapro due to cost, sent it to our pharmacy today to see if cheaper.  4. Cervical polyp, w/ Nabothian cysts likely  (benign), friable, wide open oss  - Ambulatory referral to Obstetrics / Gynecology  - 2nd opinion  5. Encounter for immunization - Flu Vaccine QUAD 36+ mos IM     Patient have been counseled extensively  about nutrition and exercise  Return in about 1 month (around 11/16/2016). htn  The patient was given clear instructions to go to ER or return to medical center if symptoms don't improve, worsen or new problems develop. The patient verbalized understanding. The patient was told to call to get lab results if they haven't heard anything in the next week.   This note has been created with Education officer, environmental. Any transcriptional errors are unintentional.   Pete Glatter, MD, MBA/MHA [facility] and [facility] [place], [place] [phone]   10/17/2016, 12:43 PM

## 2016-10-17 NOTE — Progress Notes (Signed)
Pt is here today for a pap smear. 

## 2016-10-17 NOTE — Patient Instructions (Addendum)
DASH Eating Plan  DASH stands for "Dietary Approaches to Stop Hypertension." The DASH eating plan is a healthy eating plan that has been shown to reduce high blood pressure (hypertension). Additional health benefits may include reducing the risk of type 2 diabetes mellitus, heart disease, and stroke. The DASH eating plan may also help with weight loss.  WHAT DO I NEED TO KNOW ABOUT THE DASH EATING PLAN?  For the DASH eating plan, you will follow these general guidelines:  · Choose foods with a percent daily value for sodium of less than 5% (as listed on the food label).  · Use salt-free seasonings or herbs instead of table salt or sea salt.  · Check with your health care provider or pharmacist before using salt substitutes.  · Eat lower-sodium products, often labeled as "lower sodium" or "no salt added."  · Eat fresh foods.  · Eat more vegetables, fruits, and low-fat dairy products.  · Choose whole grains. Look for the word "whole" as the first word in the ingredient list.  · Choose fish and skinless chicken or turkey more often than red meat. Limit fish, poultry, and meat to 6 oz (170 g) each day.  · Limit sweets, desserts, sugars, and sugary drinks.  · Choose heart-healthy fats.  · Limit cheese to 1 oz (28 g) per day.  · Eat more home-cooked food and less restaurant, buffet, and fast food.  · Limit fried foods.  · Cook foods using methods other than frying.  · Limit canned vegetables. If you do use them, rinse them well to decrease the sodium.  · When eating at a restaurant, ask that your food be prepared with less salt, or no salt if possible.  WHAT FOODS CAN I EAT?  Seek help from a dietitian for individual calorie needs.  Grains  Whole grain or whole wheat bread. Brown rice. Whole grain or whole wheat pasta. Quinoa, bulgur, and whole grain cereals. Low-sodium cereals. Corn or whole wheat flour tortillas. Whole grain cornbread. Whole grain crackers. Low-sodium crackers.  Vegetables  Fresh or frozen vegetables  (raw, steamed, roasted, or grilled). Low-sodium or reduced-sodium tomato and vegetable juices. Low-sodium or reduced-sodium tomato sauce and paste. Low-sodium or reduced-sodium canned vegetables.   Fruits  All fresh, canned (in natural juice), or frozen fruits.  Meat and Other Protein Products  Ground beef (85% or leaner), grass-fed beef, or beef trimmed of fat. Skinless chicken or turkey. Ground chicken or turkey. Pork trimmed of fat. All fish and seafood. Eggs. Dried beans, peas, or lentils. Unsalted nuts and seeds. Unsalted canned beans.  Dairy  Low-fat dairy products, such as skim or 1% milk, 2% or reduced-fat cheeses, low-fat ricotta or cottage cheese, or plain low-fat yogurt. Low-sodium or reduced-sodium cheeses.  Fats and Oils  Tub margarines without trans fats. Light or reduced-fat mayonnaise and salad dressings (reduced sodium). Avocado. Safflower, olive, or canola oils. Natural peanut or almond butter.  Other  Unsalted popcorn and pretzels.  The items listed above may not be a complete list of recommended foods or beverages. Contact your dietitian for more options.  WHAT FOODS ARE NOT RECOMMENDED?  Grains  White bread. White pasta. White rice. Refined cornbread. Bagels and croissants. Crackers that contain trans fat.  Vegetables  Creamed or fried vegetables. Vegetables in a cheese sauce. Regular canned vegetables. Regular canned tomato sauce and paste. Regular tomato and vegetable juices.  Fruits  Dried fruits. Canned fruit in light or heavy syrup. Fruit juice.  Meat and Other Protein   Products  Fatty cuts of meat. Ribs, chicken wings, bacon, sausage, bologna, salami, chitterlings, fatback, hot dogs, bratwurst, and packaged luncheon meats. Salted nuts and seeds. Canned beans with salt.  Dairy  Whole or 2% milk, cream, half-and-half, and cream cheese. Whole-fat or sweetened yogurt. Full-fat cheeses or blue cheese. Nondairy creamers and whipped toppings. Processed cheese, cheese spreads, or cheese  curds.  Condiments  Onion and garlic salt, seasoned salt, table salt, and sea salt. Canned and packaged gravies. Worcestershire sauce. Tartar sauce. Barbecue sauce. Teriyaki sauce. Soy sauce, including reduced sodium. Steak sauce. Fish sauce. Oyster sauce. Cocktail sauce. Horseradish. Ketchup and mustard. Meat flavorings and tenderizers. Bouillon cubes. Hot sauce. Tabasco sauce. Marinades. Taco seasonings. Relishes.  Fats and Oils  Butter, stick margarine, lard, shortening, ghee, and bacon fat. Coconut, palm kernel, or palm oils. Regular salad dressings.  Other  Pickles and olives. Salted popcorn and pretzels.  The items listed above may not be a complete list of foods and beverages to avoid. Contact your dietitian for more information.  WHERE CAN I FIND MORE INFORMATION?  National Heart, Lung, and Blood Institute: www.nhlbi.nih.gov/health/health-topics/topics/dash/     This information is not intended to replace advice given to you by your health care provider. Make sure you discuss any questions you have with your health care provider.     Document Released: 11/23/2011 Document Revised: 12/25/2014 Document Reviewed: 10/08/2013  Elsevier Interactive Patient Education ©2016 Elsevier Inc.

## 2016-10-18 LAB — CERVICOVAGINAL ANCILLARY ONLY
CHLAMYDIA, DNA PROBE: NEGATIVE
NEISSERIA GONORRHEA: NEGATIVE
WET PREP (BD AFFIRM): NEGATIVE

## 2016-10-18 LAB — CYTOLOGY - PAP
Diagnosis: NEGATIVE
HPV: NOT DETECTED

## 2016-10-23 LAB — CERVICOVAGINAL ANCILLARY ONLY: Herpes: NEGATIVE

## 2016-10-25 ENCOUNTER — Telehealth: Payer: Self-pay

## 2016-10-25 NOTE — Telephone Encounter (Signed)
Contacted pt to go over lab results pt didn't answer lvm asking pt to give me a call at her earliest convenience  

## 2016-10-26 ENCOUNTER — Telehealth: Payer: Self-pay | Admitting: Internal Medicine

## 2016-10-26 NOTE — Telephone Encounter (Signed)
Pt returning call from nurse to go over lab results

## 2016-11-22 ENCOUNTER — Encounter: Payer: Medicaid Other | Admitting: Family Medicine

## 2017-05-03 ENCOUNTER — Encounter: Payer: Self-pay | Admitting: Internal Medicine

## 2017-05-04 ENCOUNTER — Encounter: Payer: Self-pay | Admitting: Internal Medicine

## 2017-05-07 ENCOUNTER — Encounter: Payer: Self-pay | Admitting: Internal Medicine

## 2017-06-25 ENCOUNTER — Other Ambulatory Visit: Payer: Self-pay | Admitting: Internal Medicine

## 2018-04-15 ENCOUNTER — Encounter (HOSPITAL_COMMUNITY): Payer: Self-pay | Admitting: Emergency Medicine

## 2018-04-15 ENCOUNTER — Emergency Department (HOSPITAL_COMMUNITY)
Admission: EM | Admit: 2018-04-15 | Discharge: 2018-04-15 | Disposition: A | Discharge status: Home / Self Care | Payer: Self-pay | Attending: Emergency Medicine | Admitting: Emergency Medicine

## 2018-04-15 DIAGNOSIS — I10 Essential (primary) hypertension: Secondary | ICD-10-CM | POA: Insufficient documentation

## 2018-04-15 LAB — CBC WITH DIFFERENTIAL/PLATELET
BASOS ABS: 0.1 10*3/uL (ref 0.0–0.1)
Basophils Relative: 1 %
Eosinophils Absolute: 0.2 10*3/uL (ref 0.0–0.7)
Eosinophils Relative: 3 %
HCT: 27.1 % — ABNORMAL LOW (ref 36.0–46.0)
HEMOGLOBIN: 7.6 g/dL — AB (ref 12.0–15.0)
LYMPHS ABS: 2.3 10*3/uL (ref 0.7–4.0)
Lymphocytes Relative: 34 %
MCH: 17.8 pg — AB (ref 26.0–34.0)
MCHC: 28 g/dL — AB (ref 30.0–36.0)
MCV: 63.5 fL — ABNORMAL LOW (ref 78.0–100.0)
MONO ABS: 0.5 10*3/uL (ref 0.1–1.0)
MONOS PCT: 7 %
NEUTROS ABS: 3.6 10*3/uL (ref 1.7–7.7)
Neutrophils Relative %: 55 %
Platelets: 234 10*3/uL (ref 150–400)
RBC: 4.27 MIL/uL (ref 3.87–5.11)
RDW: 19.4 % — AB (ref 11.5–15.5)
WBC: 6.7 10*3/uL (ref 4.0–10.5)

## 2018-04-15 LAB — BASIC METABOLIC PANEL
Anion gap: 10 (ref 5–15)
BUN: 14 mg/dL (ref 6–20)
CALCIUM: 9.1 mg/dL (ref 8.9–10.3)
CO2: 20 mmol/L — ABNORMAL LOW (ref 22–32)
CREATININE: 0.8 mg/dL (ref 0.44–1.00)
Chloride: 108 mmol/L (ref 101–111)
GFR calc Af Amer: >60 mL/min (ref >60)
Glucose, Bld: 99 mg/dL (ref 65–99)
Potassium: 3.6 mmol/L (ref 3.5–5.1)
SODIUM: 138 mmol/L (ref 135–145)

## 2018-04-15 LAB — I-STAT BETA HCG BLOOD, ED (MC, WL, AP ONLY): I-stat hCG, quantitative: <5 m[IU]/mL (ref <5)

## 2018-04-15 MED ORDER — IBUPROFEN 800 MG PO TABS
800.0000 mg | ORAL_TABLET | Freq: Once | ORAL | Status: AC
  Administered 2018-04-15: 800 mg via ORAL
  Filled 2018-04-15: qty 1

## 2018-04-15 MED ORDER — LISINOPRIL 20 MG PO TABS
20.0000 mg | ORAL_TABLET | Freq: Once | ORAL | Status: AC
  Administered 2018-04-15: 20 mg via ORAL
  Filled 2018-04-15: qty 1

## 2018-04-15 MED ORDER — HYDROCHLOROTHIAZIDE 12.5 MG PO CAPS
12.5000 mg | ORAL_CAPSULE | Freq: Once | ORAL | Status: AC
  Administered 2018-04-15: 12.5 mg via ORAL
  Filled 2018-04-15: qty 1

## 2018-04-15 MED ORDER — LISINOPRIL-HYDROCHLOROTHIAZIDE 20-25 MG PO TABS: 1.0000 | ORAL_TABLET | Freq: Every day | ORAL | 3 refills | Status: DC

## 2018-04-15 NOTE — ED Provider Notes (Signed)
[facility]-EMERGENCY DEPT Provider Note   CSN: [id number] Arrival date & time: 04/15/18  1249     History   Chief Complaint Chief Complaint  Patient presents with  . Hypertension  . Headache  . Nasal Leaking    HPI Sarah Myers is a 47 y.o. female.  HPI  Patient with history of hypertension not currently taking any medications presents with complaint of intermittent left-sided headache as well as nasal drainage.  She states that she has had trouble with tolerating blood pressure medications in terms of these medicines making her feel tired or more "and "sick".  She cannot remember the names of the medications she has tried or the ones that she has had difficulty tolerating.  She states her headache has resolved but when she arrived in the ED today it was left-sided and constant.  It was gradual in onset.  She has had no double vision or blurred vision.  No focal weakness.  No chest pain or difficulty breathing.  She also describes clear nasal drainage and was not sure if that had anything to do with her blood pressure.  No nosebleeds.  She also describes occasionally feeling that medication or food gets stuck in her throat.  She states this is happened rarely in the past several years and happened again recently.  She has never had GI evaluation for this.  There are no other associated systemic symptoms, there are no other alleviating or modifying factors.   Past Medical History:  Diagnosis Date  . Hypertension     Patient Active Problem List   Diagnosis Date Noted  . Forearm fractures, both bones, closed 02/10/2012  . ACNE 02/14/2007    Past Surgical History:  Procedure Laterality Date  . CESAREAN SECTION    . ORIF RADIAL FRACTURE  02/10/2012   Procedure: OPEN REDUCTION INTERNAL FIXATION (ORIF) RADIAL FRACTURE;  Surgeon: Cammy Copa, MD;  Location: [facility] OR;  Service: Orthopedics;  Laterality: Right;  . ORIF ULNAR FRACTURE  02/10/2012   Procedure: OPEN REDUCTION INTERNAL FIXATION (ORIF) ULNAR FRACTURE;  Surgeon: Cammy Copa, MD;  Location: [facility] OR;  Service: Orthopedics;  Laterality: Right;  . TUBAL LIGATION       OB History   None      Home Medications    Prior to Admission medications   Medication Sig Start Date End Date Taking? Authorizing Provider  escitalopram (LEXAPRO) 10 MG tablet Take 1 tablet (10 mg total) by mouth daily. To start; take 1/2 tab daily x 4 days, than take full tab daily. Patient not taking: Reported on 04/15/2018 10/17/16   Pete Glatter, MD  lisinopril-hydrochlorothiazide (PRINZIDE,ZESTORETIC) 20-25 MG tablet Take 1 tablet by mouth daily. 04/15/18   Mabe, Latanya Maudlin, MD    Family History Family History  Problem Relation Age of Onset  . Diabetes Mother   . Diabetes Maternal Aunt   . Stroke Maternal Uncle   . Diabetes Maternal Grandmother   . Heart disease Maternal Grandmother   . Diabetes Maternal Grandfather   . Heart disease Maternal Grandfather     Social History Social History   Tobacco Use  . Smoking status: Never Smoker  . Smokeless tobacco: Never Used  Substance Use Topics  . Alcohol use: Yes    Alcohol/week: 0.0 oz  . Drug use: No     Allergies   Patient has no known allergies.   Review of Systems Review of Systems  ROS reviewed and all otherwise negative except  for mentioned in HPI   Physical Exam Updated Vital Signs BP (!) 213/112 (BP Location: Left Arm)   Pulse 75   Temp 99.3 F (37.4 C) (Oral)   Resp (!) 22   SpO2 100%  Vitals reviewed Physical Exam  Physical Examination: General appearance - alert, well appearing, and in no distress Mental status - alert, oriented to person, place, and time Eyes - pupils equal and reactive, extraocular eye movements intact Chest - clear to auscultation, no wheezes, rales or rhonchi, symmetric air entry Heart - normal rate, regular rhythm, normal S1, S2, no murmurs, rubs, clicks or gallops Neurological -  alert, orientedx 3, no cranial nerve defect, strength 5/5 in extremities x 4, sensation intact Extremities - peripheral pulses normal, no pedal edema, no clubbing or cyanosis Skin - normal coloration and turgor, no rashes   ED Treatments / Results  Labs (all labs ordered are listed, but only abnormal results are displayed) Labs Reviewed  BASIC METABOLIC PANEL - Abnormal; Notable for the following components:      Result Value   CO2 20 (*)    All other components within normal limits  CBC WITH DIFFERENTIAL/PLATELET - Abnormal; Notable for the following components:   Hemoglobin 7.6 (*)    HCT 27.1 (*)    MCV 63.5 (*)    MCH 17.8 (*)    MCHC 28.0 (*)    RDW 19.4 (*)    All other components within normal limits  I-STAT BETA HCG BLOOD, ED ([facility], [facility], [facility] ONLY)    EKG EKG Interpretation  Date/Time:  Monday April 15 2018 21:29:43 EDT Ventricular Rate:  66 PR Interval:    QRS Duration: 98 QT Interval:  431 QTC Calculation: 452 R Axis:   102 Text Interpretation:  Sinus rhythm Probable left atrial enlargement Right axis deviation Minimal ST depression Since previous tracing axis has shifted rightward Confirmed by Jerelyn Scott [phone]) on 04/15/2018 9:50:40 PM   Radiology No results found.  Procedures Procedures (including critical care time)  Medications Ordered in ED Medications  ibuprofen (ADVIL,MOTRIN) tablet 800 mg (800 mg Oral Given 04/15/18 2144)  lisinopril (PRINIVIL,ZESTRIL) tablet 20 mg (20 mg Oral Given 04/15/18 2145)  hydrochlorothiazide (MICROZIDE) capsule 12.5 mg (12.5 mg Oral Given 04/15/18 2145)     Initial Impression / Assessment and Plan / ED Course  I have reviewed the triage vital signs and the nursing notes.  Pertinent labs & imaging results that were available during my care of the patient were reviewed by me and considered in my medical decision making (see chart for details).    Patient presenting with complaint of intermittent headache and concern about  her blood pressure.  She is not currently on any treatment as she states she has trouble tolerating blood pressure medications.  She is neurologically intact.  She no longer has a significant headache.  She has no signs of endorgan damage on labs or EKG.  In reviewing her epic chart with her she remembered that taking lisinopril and hydrochlorothiazide combination did not give her negative side effects.  I have talked with her extensively about the nature of hypertension and the importance of following up for regular blood pressure checks. Discharged with strict return precautions.  Pt agreeable with plan.  Final Clinical Impressions(s) / ED Diagnoses   Final diagnoses:  Essential hypertension    ED Discharge Orders        Ordered    lisinopril-hydrochlorothiazide (PRINZIDE,ZESTORETIC) 20-25 MG tablet  Daily     04/15/18  2158       Phillis Haggis, MD 04/15/18 2340

## 2018-04-15 NOTE — ED Triage Notes (Signed)
Patient here from home with complaints of severe headache. States that her nose drips fluid constantly even though she does not have a cold. Also states that she always feel as if food gets stuck in throat.

## 2018-04-15 NOTE — Discharge Instructions (Signed)
Return to the ED with any concerns including chest pain, fainting, changes in vision or speech, weakness in arms or legs, decreased level of alertness/lethargy, or any other alarming symptoms

## 2018-11-16 ENCOUNTER — Emergency Department (HOSPITAL_COMMUNITY): Payer: Medicaid Other

## 2018-11-16 ENCOUNTER — Other Ambulatory Visit: Payer: Self-pay

## 2018-11-16 ENCOUNTER — Emergency Department (HOSPITAL_COMMUNITY)
Admission: EM | Admit: 2018-11-16 | Discharge: 2018-11-16 | Disposition: A | Discharge status: Home / Self Care | Payer: Medicaid Other | Attending: Emergency Medicine | Admitting: Emergency Medicine

## 2018-11-16 ENCOUNTER — Encounter (HOSPITAL_COMMUNITY): Payer: Self-pay

## 2018-11-16 DIAGNOSIS — R05 Cough: Secondary | ICD-10-CM

## 2018-11-16 DIAGNOSIS — G43009 Migraine without aura, not intractable, without status migrainosus: Secondary | ICD-10-CM

## 2018-11-16 DIAGNOSIS — J069 Acute upper respiratory infection, unspecified: Secondary | ICD-10-CM

## 2018-11-16 DIAGNOSIS — G43909 Migraine, unspecified, not intractable, without status migrainosus: Secondary | ICD-10-CM | POA: Insufficient documentation

## 2018-11-16 DIAGNOSIS — D649 Anemia, unspecified: Secondary | ICD-10-CM

## 2018-11-16 DIAGNOSIS — R059 Cough, unspecified: Secondary | ICD-10-CM

## 2018-11-16 DIAGNOSIS — I10 Essential (primary) hypertension: Secondary | ICD-10-CM

## 2018-11-16 LAB — CBC WITH DIFFERENTIAL/PLATELET
Abs Immature Granulocytes: 0.01 10*3/uL (ref 0.00–0.07)
BASOS ABS: 0.1 10*3/uL (ref 0.0–0.1)
BASOS PCT: 1 %
Eosinophils Absolute: 0.1 10*3/uL (ref 0.0–0.5)
Eosinophils Relative: 1 %
HCT: 27.4 % — ABNORMAL LOW (ref 36.0–46.0)
Hemoglobin: 7.2 g/dL — ABNORMAL LOW (ref 12.0–15.0)
IMMATURE GRANULOCYTES: 0 %
LYMPHS ABS: 2 10*3/uL (ref 0.7–4.0)
Lymphocytes Relative: 28 %
MCH: 17 pg — AB (ref 26.0–34.0)
MCHC: 26.3 g/dL — AB (ref 30.0–36.0)
MCV: 64.8 fL — AB (ref 80.0–100.0)
MONOS PCT: 5 %
Monocytes Absolute: 0.4 10*3/uL (ref 0.1–1.0)
NEUTROS ABS: 4.6 10*3/uL (ref 1.7–7.7)
NEUTROS PCT: 65 %
NRBC: 0 % (ref 0.0–0.2)
PLATELETS: 241 10*3/uL (ref 150–400)
RBC: 4.23 MIL/uL (ref 3.87–5.11)
RDW: 20.4 % — ABNORMAL HIGH (ref 11.5–15.5)
WBC: 7.1 10*3/uL (ref 4.0–10.5)

## 2018-11-16 LAB — COMPREHENSIVE METABOLIC PANEL
ALBUMIN: 3.8 g/dL (ref 3.5–5.0)
ALT: 13 U/L (ref 0–44)
AST: 19 U/L (ref 15–41)
Alkaline Phosphatase: 52 U/L (ref 38–126)
Anion gap: 9 (ref 5–15)
BUN: 11 mg/dL (ref 6–20)
CO2: 23 mmol/L (ref 22–32)
Calcium: 9.2 mg/dL (ref 8.9–10.3)
Chloride: 106 mmol/L (ref 98–111)
Creatinine, Ser: 0.65 mg/dL (ref 0.44–1.00)
GFR calc Af Amer: >60 mL/min (ref >60)
GFR calc non Af Amer: >60 mL/min (ref >60)
GLUCOSE: 81 mg/dL (ref 70–99)
POTASSIUM: 3.6 mmol/L (ref 3.5–5.1)
Sodium: 138 mmol/L (ref 135–145)
Total Bilirubin: 0.5 mg/dL (ref 0.3–1.2)
Total Protein: 7.7 g/dL (ref 6.5–8.1)

## 2018-11-16 LAB — I-STAT BETA HCG BLOOD, ED (MC, WL, AP ONLY)

## 2018-11-16 LAB — TROPONIN I

## 2018-11-16 MED ORDER — HYDROCHLOROTHIAZIDE 12.5 MG PO CAPS
25.0000 mg | ORAL_CAPSULE | Freq: Once | ORAL | Status: AC
  Administered 2018-11-16: 25 mg via ORAL
  Filled 2018-11-16: qty 2

## 2018-11-16 MED ORDER — ALBUTEROL SULFATE HFA 108 (90 BASE) MCG/ACT IN AERS: 1.0000 | INHALATION_SPRAY | Freq: Four times a day (QID) | RESPIRATORY_TRACT | 0 refills | Status: DC | PRN

## 2018-11-16 MED ORDER — FLUTICASONE PROPIONATE 50 MCG/ACT NA SUSP: 2.0000 | Freq: Every day | NASAL | 2 refills | Status: DC

## 2018-11-16 MED ORDER — DIPHENHYDRAMINE HCL 50 MG/ML IJ SOLN
25.0000 mg | Freq: Once | INTRAMUSCULAR | Status: AC
  Administered 2018-11-16: 25 mg via INTRAVENOUS
  Filled 2018-11-16: qty 1

## 2018-11-16 MED ORDER — LISINOPRIL 20 MG PO TABS
20.0000 mg | ORAL_TABLET | Freq: Once | ORAL | Status: AC
  Administered 2018-11-16: 20 mg via ORAL
  Filled 2018-11-16: qty 1

## 2018-11-16 MED ORDER — KETOROLAC TROMETHAMINE 30 MG/ML IJ SOLN
30.0000 mg | Freq: Once | INTRAMUSCULAR | Status: AC
  Administered 2018-11-16: 30 mg via INTRAVENOUS
  Filled 2018-11-16: qty 1

## 2018-11-16 MED ORDER — METOCLOPRAMIDE HCL 5 MG/ML IJ SOLN
10.0000 mg | Freq: Once | INTRAMUSCULAR | Status: AC
  Administered 2018-11-16: 10 mg via INTRAVENOUS
  Filled 2018-11-16: qty 2

## 2018-11-16 NOTE — ED Notes (Signed)
Patient transported to X-ray 

## 2018-11-16 NOTE — ED Notes (Signed)
Attempted to call to give report. RN instructed to call back in 5 minutes. RN will go ahead and call [facility] to get patient in route to [facility].

## 2018-11-16 NOTE — Discharge Instructions (Addendum)
You were seen in the ED today with headache, elevated blood pressure, and nasal congestion. We are starting medication to help with your symptoms. Your labs today show a significant anemia. You need to call the PCP on Monday to a follow up appointment and to discuss your low blood levels. Return to the ED with any chest pain, shortness of breath, or lightheadedness.

## 2018-11-16 NOTE — ED Triage Notes (Signed)
She c/o headache "for a couple of days now". She also reports uri sx "for a long time now". She is alert and oriented x 4.

## 2018-11-16 NOTE — ED Provider Notes (Signed)
Emergency Department Provider Note   I have reviewed the triage vital signs and the nursing notes.   HISTORY  Chief Complaint Headache and URI   HPI Sarah Myers is a 47 y.o. female with PMH of HTN presents to the emergency department for evaluation of headache, elevated blood pressure, URI symptoms.  Patient describes nasal congestion and cough for the past several weeks.  She occasionally takes over-the-counter medication but not consistently.  Over the past several days she is developed a diffuse, intermittent headache.  Occasionally she will have frontal headache but mostly posterior.  She has some nausea and light sensitivity.  She notes a history of migraine headache but states this seems somewhat different and slightly worse.  She denies any numbness, tingling, weakness.  Denies vision changes or vomiting.  She has been compliant with her blood pressure medication but notices that it remains high.  1 week ago she was having a headache and experience some right face numbness that resolved with the headache resolved and has not returned. No fever or chills. No CP or SOB.   Past Medical History:  Diagnosis Date  . Hypertension     Patient Active Problem List   Diagnosis Date Noted  . Forearm fractures, both bones, closed 02/10/2012  . ACNE 02/14/2007    Past Surgical History:  Procedure Laterality Date  . CESAREAN SECTION    . ORIF RADIAL FRACTURE  02/10/2012   Procedure: OPEN REDUCTION INTERNAL FIXATION (ORIF) RADIAL FRACTURE;  Surgeon: Cammy Copa, MD;  Location: [facility] OR;  Service: Orthopedics;  Laterality: Right;  . ORIF ULNAR FRACTURE  02/10/2012   Procedure: OPEN REDUCTION INTERNAL FIXATION (ORIF) ULNAR FRACTURE;  Surgeon: Cammy Copa, MD;  Location: [facility] OR;  Service: Orthopedics;  Laterality: Right;  . TUBAL LIGATION      Allergies Patient has no known allergies.  Family History  Problem Relation Age of Onset  . Diabetes Mother   . Diabetes  Maternal Aunt   . Stroke Maternal Uncle   . Diabetes Maternal Grandmother   . Heart disease Maternal Grandmother   . Diabetes Maternal Grandfather   . Heart disease Maternal Grandfather     Social History Social History   Tobacco Use  . Smoking status: Never Smoker  . Smokeless tobacco: Never Used  Substance Use Topics  . Alcohol use: Yes    Alcohol/week: 0.0 standard drinks  . Drug use: No    Review of Systems  Constitutional: No fever/chills Eyes: No visual changes. ENT: No sore throat. Positive runny nose and cough.  Cardiovascular: Denies chest pain. Respiratory: Denies shortness of breath. Gastrointestinal: No abdominal pain. Positive nausea, no vomiting.  No diarrhea.  No constipation. Genitourinary: Negative for dysuria. Musculoskeletal: Negative for back pain. Skin: Negative for rash. Neurological: Negative for focal weakness or numbness. Positive HA.   10-point ROS otherwise negative.  ____________________________________________   PHYSICAL EXAM:  VITAL SIGNS: ED Triage Vitals  Enc Vitals Group     BP 11/16/18 1121 (!) 194/110     Pulse Rate 11/16/18 1121 77     Resp 11/16/18 1121 16     Temp 11/16/18 1121 98.1 F (36.7 C)     Temp src --      SpO2 11/16/18 1121 98 %     Weight 11/16/18 1121 137 lb (62.1 kg)     Height 11/16/18 1121 5\' 3"  (1.6 m)     Pain Score 11/16/18 1134 8   Constitutional: Alert and oriented. Well  appearing and in no acute distress. Eyes: Conjunctivae are normal. PERRL. Head: Atraumatic. Nose: No congestion/rhinnorhea. Mouth/Throat: Mucous membranes are moist.   Neck: No stridor.  Cardiovascular: Normal rate, regular rhythm. Good peripheral circulation. Grossly normal heart sounds.   Respiratory: Normal respiratory effort.  No retractions. Lungs CTAB. Gastrointestinal: Soft and nontender. No distention.  Musculoskeletal: No lower extremity tenderness nor edema. No gross deformities of extremities. Neurologic:  Normal  speech and language. No gross focal neurologic deficits are appreciated.  Skin:  Skin is warm, dry and intact. No rash noted. Normal CN exam 2-12.  ____________________________________________   LABS (all labs ordered are listed, but only abnormal results are displayed)  Labs Reviewed  CBC WITH DIFFERENTIAL/PLATELET - Abnormal; Notable for the following components:      Result Value   Hemoglobin 7.2 (*)    HCT 27.4 (*)    MCV 64.8 (*)    MCH 17.0 (*)    MCHC 26.3 (*)    RDW 20.4 (*)    All other components within normal limits  COMPREHENSIVE METABOLIC PANEL  TROPONIN I  URINALYSIS, ROUTINE W REFLEX MICROSCOPIC  I-STAT BETA HCG BLOOD, ED ([facility], [facility], [facility] ONLY)   ____________________________________________  EKG   EKG Interpretation  Date/Time:  Saturday November 16 2018 12:47:44 EST Ventricular Rate:  70 PR Interval:    QRS Duration: 99 QT Interval:  398 QTC Calculation: 430 R Axis:   91 Text Interpretation:  Sinus rhythm Borderline right axis deviation Probable left ventricular hypertrophy Nonspecific T abnrm, anterolateral leads Similar to prior. No STEMI.  Confirmed by Alona Bene [phone]) on 11/16/2018 12:53:43 PM       ____________________________________________  RADIOLOGY  Dg Chest 2 View  Result Date: 11/16/2018 CLINICAL DATA:  Headache EXAM: CHEST - 2 VIEW COMPARISON:  02/10/2012 FINDINGS: Normal heart size. Lungs clear. No pneumothorax. No pleural effusion. IMPRESSION: No active cardiopulmonary disease. Electronically Signed   By: Jolaine Click M.D.   On: 11/16/2018 13:17   Ct Head Wo Contrast  Result Date: 11/16/2018 CLINICAL DATA:  Headache for 2 days EXAM: CT HEAD WITHOUT CONTRAST TECHNIQUE: Contiguous axial images were obtained from the base of the skull through the vertex without intravenous contrast. COMPARISON:  09/20/2015 FINDINGS: Brain: There is no mass effect, midline shift, or acute intracranial hemorrhage. Vascular: No hyperdense vessel or  unexpected calcification. Skull: Cranium is intact. Sinuses/Orbits: There is opacification of right ethmoid air cells and a middle left ethmoid air cell. Mastoid air cells are clear. Remainder of the visualized paranasal sinuses are clear. Visualized orbits are within normal limits. Other: Noncontributory. IMPRESSION: No acute intracranial pathology. Electronically Signed   By: Jolaine Click M.D.   On: 11/16/2018 13:23    ____________________________________________   PROCEDURES  Procedure(s) performed:   Procedures  None ____________________________________________   INITIAL IMPRESSION / ASSESSMENT AND PLAN / ED COURSE  Pertinent labs & imaging results that were available during my care of the patient were reviewed by me and considered in my medical decision making (see chart for details).  Patient presents to the emergency department with intermittent headache over the past several days with ongoing URI symptoms.  Patient with no focal deficits on exam here but 1 week ago did have headache with right face numbness.  Given the numbness and elevated blood pressure I do plan for CT imaging of the head but have a low suspicion for stroke or other acute neurological process.  Plan for migraine treatment here, home blood pressure medications, and labs screening  for hypertensive emergency/endorgan damage.  Plan also obtain a chest x-ray given several weeks of occasionally productive cough to rule out pneumonia.  02:45 PM BP down-trending with improved HA and home BP meds. Labs remarkable for anemia with Hb of 7.2. Similar value in April. No report of blood in the BMs. No symptoms of anemia at this time. Plan to have patient call the PCP on Monday to discuss anemia and start an outpatient w/u. Patient does have heavy menstrual cycles which is a possibility.   At this time, I do not feel there is any life-threatening condition present. I have reviewed and discussed all results (EKG, imaging, lab,  urine as appropriate), exam findings with patient. I have reviewed nursing notes and appropriate previous records.  I feel the patient is safe to be discharged home without further emergent workup. Discussed usual and customary return precautions. Patient and family (if present) verbalize understanding and are comfortable with this plan.  Patient will follow-up with their primary care provider. If they do not have a primary care provider, information for follow-up has been provided to them. All questions have been answered.  ____________________________________________  FINAL CLINICAL IMPRESSION(S) / ED DIAGNOSES  Final diagnoses:  Migraine without aura and without status migrainosus, not intractable  Essential hypertension  Anemia, unspecified type  Upper respiratory tract infection, unspecified type  Cough     MEDICATIONS GIVEN DURING THIS VISIT:  Medications  ketorolac (TORADOL) 30 MG/ML injection 30 mg (30 mg Intravenous Given 11/16/18 1311)  metoCLOPramide (REGLAN) injection 10 mg (10 mg Intravenous Given 11/16/18 1314)  diphenhydrAMINE (BENADRYL) injection 25 mg (25 mg Intravenous Given 11/16/18 1312)  lisinopril (PRINIVIL,ZESTRIL) tablet 20 mg (20 mg Oral Given 11/16/18 1309)  hydrochlorothiazide (MICROZIDE) capsule 25 mg (25 mg Oral Given 11/16/18 1309)     NEW OUTPATIENT MEDICATIONS STARTED DURING THIS VISIT:  New Prescriptions   ALBUTEROL (PROVENTIL HFA;VENTOLIN HFA) 108 (90 BASE) MCG/ACT INHALER    Inhale 1-2 puffs into the lungs every 6 (six) hours as needed for wheezing or shortness of breath.   FLUTICASONE (FLONASE) 50 MCG/ACT NASAL SPRAY    Place 2 sprays into both nostrils daily for 7 days.    Note:  This document was prepared using Dragon voice recognition software and may include unintentional dictation errors.  Alona Bene, MD Emergency Medicine    , Arlyss Repress, MD 11/16/18 [phone]

## 2019-10-26 ENCOUNTER — Emergency Department (HOSPITAL_COMMUNITY)
Admission: EM | Admit: 2019-10-26 | Discharge: 2019-10-26 | Disposition: A | Discharge status: Home / Self Care | Payer: Medicaid Other | Attending: Emergency Medicine | Admitting: Emergency Medicine

## 2019-10-26 ENCOUNTER — Other Ambulatory Visit: Payer: Self-pay

## 2019-10-26 ENCOUNTER — Emergency Department (HOSPITAL_COMMUNITY): Payer: Medicaid Other

## 2019-10-26 ENCOUNTER — Encounter (HOSPITAL_COMMUNITY): Payer: Self-pay | Admitting: *Deleted

## 2019-10-26 DIAGNOSIS — I10 Essential (primary) hypertension: Secondary | ICD-10-CM

## 2019-10-26 DIAGNOSIS — R0602 Shortness of breath: Secondary | ICD-10-CM | POA: Insufficient documentation

## 2019-10-26 DIAGNOSIS — R079 Chest pain, unspecified: Secondary | ICD-10-CM | POA: Insufficient documentation

## 2019-10-26 DIAGNOSIS — Z79899 Other long term (current) drug therapy: Secondary | ICD-10-CM | POA: Insufficient documentation

## 2019-10-26 DIAGNOSIS — R519 Headache, unspecified: Secondary | ICD-10-CM

## 2019-10-26 DIAGNOSIS — R42 Dizziness and giddiness: Secondary | ICD-10-CM | POA: Insufficient documentation

## 2019-10-26 DIAGNOSIS — R0789 Other chest pain: Secondary | ICD-10-CM

## 2019-10-26 DIAGNOSIS — E876 Hypokalemia: Secondary | ICD-10-CM

## 2019-10-26 LAB — BASIC METABOLIC PANEL
Anion gap: 10 (ref 5–15)
BUN: 10 mg/dL (ref 6–20)
CO2: 22 mmol/L (ref 22–32)
Calcium: 9.5 mg/dL (ref 8.9–10.3)
Chloride: 106 mmol/L (ref 98–111)
Creatinine, Ser: 0.75 mg/dL (ref 0.44–1.00)
GFR calc Af Amer: >60 mL/min (ref >60)
GFR calc non Af Amer: >60 mL/min (ref >60)
Glucose, Bld: 89 mg/dL (ref 70–99)
Potassium: 3.2 mmol/L — ABNORMAL LOW (ref 3.5–5.1)
Sodium: 138 mmol/L (ref 135–145)

## 2019-10-26 LAB — I-STAT BETA HCG BLOOD, ED (MC, WL, AP ONLY): I-stat hCG, quantitative: <5 m[IU]/mL (ref <5)

## 2019-10-26 LAB — CBC
HCT: 37 % (ref 36.0–46.0)
Hemoglobin: 10.6 g/dL — ABNORMAL LOW (ref 12.0–15.0)
MCH: 19.3 pg — ABNORMAL LOW (ref 26.0–34.0)
MCHC: 28.6 g/dL — ABNORMAL LOW (ref 30.0–36.0)
MCV: 67.5 fL — ABNORMAL LOW (ref 80.0–100.0)
Platelets: 212 10*3/uL (ref 150–400)
RBC: 5.48 MIL/uL — ABNORMAL HIGH (ref 3.87–5.11)
RDW: 23.3 % — ABNORMAL HIGH (ref 11.5–15.5)
WBC: 7.8 10*3/uL (ref 4.0–10.5)
nRBC: 0 % (ref 0.0–0.2)

## 2019-10-26 LAB — TROPONIN I (HIGH SENSITIVITY)
Troponin I (High Sensitivity): 6 ng/L (ref <18)
Troponin I (High Sensitivity): 6 ng/L (ref <18)

## 2019-10-26 MED ORDER — LABETALOL HCL 5 MG/ML IV SOLN
20.0000 mg | Freq: Once | INTRAVENOUS | Status: AC
  Administered 2019-10-26: 20 mg via INTRAVENOUS
  Filled 2019-10-26: qty 4

## 2019-10-26 MED ORDER — HYDRALAZINE HCL 20 MG/ML IJ SOLN
10.0000 mg | Freq: Once | INTRAMUSCULAR | Status: AC
  Administered 2019-10-26: 10 mg via INTRAVENOUS
  Filled 2019-10-26: qty 1

## 2019-10-26 MED ORDER — HYDRALAZINE HCL 20 MG/ML IJ SOLN
10.0000 mg | Freq: Once | INTRAMUSCULAR | Status: DC
  Filled 2019-10-26: qty 1

## 2019-10-26 MED ORDER — AMLODIPINE BESYLATE 10 MG PO TABS: 10.0000 mg | ORAL_TABLET | Freq: Every day | ORAL | 0 refills | Status: DC

## 2019-10-26 MED ORDER — POTASSIUM CHLORIDE CRYS ER 20 MEQ PO TBCR
40.0000 meq | EXTENDED_RELEASE_TABLET | Freq: Once | ORAL | Status: AC
  Administered 2019-10-26: 40 meq via ORAL
  Filled 2019-10-26: qty 2

## 2019-10-26 MED ORDER — SODIUM CHLORIDE 0.9% FLUSH
3.0000 mL | Freq: Once | INTRAVENOUS | Status: AC
  Administered 2019-10-26: 3 mL via INTRAVENOUS

## 2019-10-26 MED ORDER — ASPIRIN 81 MG PO CHEW
324.0000 mg | CHEWABLE_TABLET | Freq: Once | ORAL | Status: AC
  Administered 2019-10-26: 324 mg via ORAL
  Filled 2019-10-26: qty 4

## 2019-10-26 MED ORDER — HYDRALAZINE HCL 20 MG/ML IJ SOLN
20.0000 mg | Freq: Once | INTRAMUSCULAR | Status: AC
  Administered 2019-10-26: 20 mg via INTRAVENOUS

## 2019-10-26 NOTE — ED Notes (Signed)
Pt provided crackers. PB, and applesauce

## 2019-10-26 NOTE — ED Notes (Signed)
Pt verbalizes understanding of DC instructions. Pt belongings returned and is ambulatory out of ED.  

## 2019-10-26 NOTE — ED Notes (Signed)
Marisa Severin Pa at bedside

## 2019-10-26 NOTE — ED Provider Notes (Signed)
[facility]-EMERGENCY DEPT Provider Note   CSN: [id number] Arrival date & time: 10/26/19  1327   History   Chief Complaint Chief Complaint  Patient presents with  . Chest Pain    HPI Sarah Myers is a 48 y.o. female with history of hypertension who presents with chest pain.  Patient states that she went to the store today and had an acute onset of severe substernal chest pressure with associated shortness of breath and lightheadedness.  She felt like she was going to pass out so got very worried and decided to come get checked out. Patient attributes the pain to breathing in very cold air at the store.  She put a coat and this made the chest pain better.  The pain is over the left side of the chest.  It is coming and going.  She continues to have pain intermittently but it is not as severe as before.  She also had a frontal headache on the way to the hospital which was severe in nature.  This is improving.  She denies dizziness, vision changes, unilateral weakness, numbness or tingling, cough, fever, abdominal pain, nausea or vomiting.  She had some mild swelling in her legs a couple days ago which has resolved.  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  She has been off of her blood pressure medicines for approximately 4 months at this point because it was giving her a cough and feeling like her throat was getting tight.  She has a primary care provider but does not follow with them regularly.  She has never seen a cardiologist.  She does not smoke or do any drugs.  She reports family history of heart disease -several family members died of cardiac arrest but this was when they were elderly. No recent surgery/travel/immobilization, hx of cancer, leg swelling, hemoptysis, prior DVT/PE, or hormone use.   HPI  Past Medical History:  Diagnosis Date  . Hypertension     Patient Active Problem List   Diagnosis  Date Noted  . Forearm fractures, both bones, closed 02/10/2012  . ACNE 02/14/2007    Past Surgical History:  Procedure Laterality Date  . CESAREAN SECTION    . ORIF RADIAL FRACTURE  02/10/2012   Procedure: OPEN REDUCTION INTERNAL FIXATION (ORIF) RADIAL FRACTURE;  Surgeon: Cammy Copa, MD;  Location: [facility] OR;  Service: Orthopedics;  Laterality: Right;  . ORIF ULNAR FRACTURE  02/10/2012   Procedure: OPEN REDUCTION INTERNAL FIXATION (ORIF) ULNAR FRACTURE;  Surgeon: Cammy Copa, MD;  Location: [facility] OR;  Service: Orthopedics;  Laterality: Right;  . TUBAL LIGATION       OB History   No obstetric history on file.      Home Medications    Prior to Admission medications   Medication Sig Start Date End Date Taking? Authorizing Provider  albuterol (PROVENTIL HFA;VENTOLIN HFA) 108 (90 Base) MCG/ACT inhaler Inhale 1-2 puffs into the lungs every 6 (six) hours as needed for wheezing or shortness of breath. 11/16/18   Long, Arlyss Repress, MD  escitalopram (LEXAPRO) 10 MG tablet Take 1 tablet (10 mg total) by mouth daily. To start; take 1/2 tab daily x 4 days, than take full tab daily. Patient not taking: Reported on 04/15/2018 10/17/16   Pete Glatter, MD  fluticasone [facility]) 50 MCG/ACT nasal spray Place 2 sprays into both nostrils daily for 7 days. 11/16/18 11/23/18  Alona Bene  G, MD  lisinopril-hydrochlorothiazide (PRINZIDE,ZESTORETIC) 20-25 MG tablet Take 1 tablet by mouth daily. 04/15/18   Mabe, Forbes Cellar, MD    Family History Family History  Problem Relation Age of Onset  . Diabetes Mother   . Diabetes Maternal Aunt   . Stroke Maternal Uncle   . Diabetes Maternal Grandmother   . Heart disease Maternal Grandmother   . Diabetes Maternal Grandfather   . Heart disease Maternal Grandfather     Social History Social History   Tobacco Use  . Smoking status: Never Smoker  . Smokeless tobacco: Never Used  Substance Use Topics  . Alcohol use: Not Currently    Alcohol/week: 0.0  standard drinks  . Drug use: No     Allergies   Patient has no known allergies.   Review of Systems Review of Systems  Constitutional: Negative for chills and fever.  Respiratory: Positive for shortness of breath (resolved). Negative for cough and wheezing.   Cardiovascular: Positive for chest pain and leg swelling (resolved). Negative for palpitations.  Gastrointestinal: Negative for abdominal pain, nausea and vomiting.  Neurological: Positive for light-headedness. Negative for syncope.  All other systems reviewed and are negative.    Physical Exam Updated Vital Signs BP (!) 233/103 Comment: PA aware at bedside. Hx HTN  Pulse 71 Comment: PA aware at bedside. Hx HTN  Temp 98.7 F (37.1 C) (Oral)   Resp 17 Comment: PA aware at bedside. Hx HTN  Ht 5\' 3"  (1.6 m)   SpO2 96% Comment: PA aware at bedside. Hx HTN  BMI 24.27 kg/m   Physical Exam Vitals signs and nursing note reviewed.  Constitutional:      General: She is not in acute distress.    Appearance: She is well-developed. She is not ill-appearing.  HENT:     Head: Normocephalic and atraumatic.  Eyes:     General: No scleral icterus.       Right eye: No discharge.        Left eye: No discharge.     Conjunctiva/sclera: Conjunctivae normal.     Pupils: Pupils are equal, round, and reactive to light.  Neck:     Musculoskeletal: Normal range of motion.  Cardiovascular:     Rate and Rhythm: Normal rate and regular rhythm.  Pulmonary:     Effort: Pulmonary effort is normal. No respiratory distress.     Breath sounds: Normal breath sounds.  Chest:     Chest wall: No tenderness.  Abdominal:     General: There is no distension.     Palpations: Abdomen is soft.     Tenderness: There is no abdominal tenderness.  Musculoskeletal:     Right lower leg: No edema.     Left lower leg: No edema.  Skin:    General: Skin is warm and dry.  Neurological:     Mental Status: She is alert and oriented to person, place, and  time.  Psychiatric:        Behavior: Behavior normal.      ED Treatments / Results  Labs (all labs ordered are listed, but only abnormal results are displayed) Labs Reviewed  CBC - Abnormal; Notable for the following components:      Result Value   RBC 5.48 (*)    Hemoglobin 10.6 (*)    MCV 67.5 (*)    MCH 19.3 (*)    MCHC 28.6 (*)    RDW 23.3 (*)    All other components within normal limits  BASIC  METABOLIC PANEL  I-STAT BETA HCG BLOOD, ED (MC, [facility], AP ONLY)  TROPONIN I (HIGH SENSITIVITY)    EKG EKG Interpretation  Date/Time:  Sunday October 26 2019 13:40:59 EST Ventricular Rate:  76 PR Interval:    QRS Duration: 111 QT Interval:  397 QTC Calculation: 447 R Axis:   117 Text Interpretation: NSR  t wave inversion in inferior leads, t wave inversions in lateral leads Consider left ventricular hypertrophy Lateral infarct, acute Confirmed by Virgina Norfolk [phone]) on 10/26/2019 1:46:21 PM   Radiology No results found.  Procedures Procedures (including critical care time)  Medications Ordered in ED Medications  sodium chloride flush (NS) 0.9 % injection 3 mL (3 mLs Intravenous Given 10/26/19 1416)  hydrALAZINE (APRESOLINE) injection 20 mg (20 mg Intravenous Given 10/26/19 1435)     Initial Impression / Assessment and Plan / ED Course  I have reviewed the triage vital signs and the nursing notes.  Pertinent labs & imaging results that were available during my care of the patient were reviewed by me and considered in my medical decision making (see chart for details).  48 year old female presents with acute episode of chest pain with SOB and lightheadedness in the setting of significantly elevated blood pressure running around 230/110. Other vitals are normal. Symptoms are atypical in nature and are more concerning for hypertensive emergency as opposed to ACS. EKG is SR but has new T wave inversions in inferior leads. T wave inversions in lateral leads were seen on prior  EKG. Will initiate CP work up with labs, trop, CXR. Will also obtain CT head as she had a severe headache prior to arrival which is resolving. Will give IV Hydralazine.  At shift change work up is pending. Care signed out to Ardine Eng PA-C who will dispo  Final Clinical Impressions(s) / ED Diagnoses   Final diagnoses:  Atypical chest pain  Frontal headache  Hypertension, unspecified type  Hypokalemia    ED Discharge Orders    None       Bethel Born, PA-C 10/27/19 1427    Virgina Norfolk, DO 10/28/19 [phone]

## 2019-10-26 NOTE — ED Notes (Signed)
Pt off floor in CT 

## 2019-10-26 NOTE — ED Notes (Signed)
Pt transported to XRAY °

## 2019-10-26 NOTE — ED Provider Notes (Signed)
Received patient at signout from [facility].  Refer to provider note for full history and physical examination.  Briefly, patient is a 48 year old female with history of hypertension presents for evaluation of acute onset left-sided chest pressure while at work today.  Notes associated shortness of breath and lightheadedness and while on the way to the ED developed severe frontal headache which has since improved.  Chest pain is intermittent, nonradiating.  Has not been taking any blood pressure medications for the last 4 months and has not been seen by PCP in about 3 years.  Some financial constraints related to her medication management.  EKG today shows T wave inversions in the lateral leads.  She is pending chest pain work-up, head CT and blood pressure control.  Blood pressure and symptoms improve and chest pain work-up is reassuring she may be stable for discharge home. Physical Exam  BP (!) 196/98   Pulse 75   Temp 98.7 F (37.1 C) (Oral)   Resp 15   Ht 5\' 3"  (1.6 m)   SpO2 93%   BMI 24.27 kg/m   Physical Exam Vitals signs and nursing note reviewed.  Constitutional:      General: She is not in acute distress.    Appearance: She is well-developed.     Comments: Resting comfortably in bed  HENT:     Head: Normocephalic and atraumatic.  Eyes:     General:        Right eye: No discharge.        Left eye: No discharge.     Conjunctiva/sclera: Conjunctivae normal.  Neck:     Vascular: No JVD.     Trachea: No tracheal deviation.  Cardiovascular:     Rate and Rhythm: Normal rate.  Pulmonary:     Effort: Pulmonary effort is normal.     Comments: Speaking in full sentences without difficulty Abdominal:     General: There is no distension.  Skin:    General: Skin is warm and dry.     Findings: No erythema.  Neurological:     Mental Status: She is alert.  Psychiatric:        Behavior: Behavior normal.     ED Course/Procedures     Procedures  MDM   Labs Reviewed  BASIC  METABOLIC PANEL - Abnormal; Notable for the following components:      Result Value   Potassium 3.2 (*)    All other components within normal limits  CBC - Abnormal; Notable for the following components:   RBC 5.48 (*)    Hemoglobin 10.6 (*)    MCV 67.5 (*)    MCH 19.3 (*)    MCHC 28.6 (*)    RDW 23.3 (*)    All other components within normal limits  I-STAT BETA HCG BLOOD, ED (MC, [facility], AP ONLY)  TROPONIN I (HIGH SENSITIVITY)  TROPONIN I (HIGH SENSITIVITY)    Dg Chest 2 View  Result Date: 10/26/2019 CLINICAL DATA:  Chest pain EXAM: CHEST - 2 VIEW COMPARISON:  Radiograph 11/16/2018 FINDINGS: No consolidation, features of edema, pneumothorax, or effusion. Pulmonary vascularity is normally distributed. The cardiomediastinal contours are unremarkable. No acute osseous or soft tissue abnormality. IMPRESSION: No acute cardiopulmonary abnormality. Electronically Signed   By: Lovena Le M.D.   On: 10/26/2019 15:09   Ct Head Wo Contrast  Result Date: 10/26/2019 CLINICAL DATA:  Severe headache, elevated blood pressure EXAM: CT HEAD WITHOUT CONTRAST TECHNIQUE: Contiguous axial images were obtained from the base of  the skull through the vertex without intravenous contrast. COMPARISON:  11/16/2018 FINDINGS: Brain: No evidence of acute infarction, hemorrhage, hydrocephalus, extra-axial collection or mass lesion/mass effect. Mild white matter hypodensity. Vascular: No hyperdense vessel or unexpected calcification. Skull: Normal. Negative for fracture or focal lesion. Sinuses/Orbits: No acute finding. Other: None. IMPRESSION: 1. No acute intracranial pathology. No non-contrast CT findings to explain headache. 2.  Small-vessel white matter disease. Electronically Signed   By: Lauralyn Primes M.D.   On: 10/26/2019 16:26    Serial troponins are negative.  Lab work reviewed by me shows no leukocytosis, mild anemia, mild hypokalemia.  Will replenish potassium orally in the ED.  Blood pressure has improved after IV  doses of hydralazine and labetalol.  On reevaluation patient is resting comfortably in no apparent distress.  She states her chest pain has resolved and her headache is significantly improved.  She feels comfortable with discharge home though we discussed the utility of admission to the hospital given EKG changes and HEART score of 4.  She feels quite motivated to restart medications and follow-up with PCP and cardiology on an outpatient basis and does not wish to be admitted to the hospital at this time.  Discussed the risks of going home the patient is adamant that she would not like to be admitted.  She was unable to take losartan due to financial constraints and lisinopril caused cough.  We will start her on amlodipine 10 mg daily [facility], will refer her back to the new office which is near her home.  We will also refer to cardiology for follow-up on an outpatient basis. Also discussed dietary modification. Discussed strict ED return precautions Pt verbalized understanding of and agreement with plan and is safe for discharge home at this time.       Jeanie Sewer, PA-C 10/26/19 1912    Jacalyn Lefevre, MD 10/29/19 [phone]

## 2019-10-26 NOTE — ED Triage Notes (Signed)
Mid sternal chest discomfort this am on and off, states she feels like she can not breath when it happens, pain does not travel anywhere else, nor any other symptoms.

## 2019-10-26 NOTE — Discharge Instructions (Signed)
Start taking amlodipine once daily.  Drink plenty of fluids and get plenty of rest.  I have attached information for dietary modifications that can be helpful to control blood pressure.  Try to exercise 3 times weekly for 30 minutes at a time at least.   Call [place] primary care at [facility] tomorrow after 9 AM and tell them you were referred from the emergency department to establish primary care services.  I have also put in a referral to cardiology and if you do not hear from them you can give them a call to schedule an appointment.  Return to the emergency department immediately for any concerning signs or symptoms develop such as severe headaches, persistent vomiting, loss of consciousness, shortness of breath.

## 2019-10-30 ENCOUNTER — Ambulatory Visit (INDEPENDENT_AMBULATORY_CARE_PROVIDER_SITE_OTHER): Payer: Self-pay | Admitting: Cardiology

## 2019-10-30 ENCOUNTER — Ambulatory Visit (INDEPENDENT_AMBULATORY_CARE_PROVIDER_SITE_OTHER): Payer: Self-pay

## 2019-10-30 ENCOUNTER — Encounter: Payer: Self-pay | Admitting: Cardiology

## 2019-10-30 ENCOUNTER — Other Ambulatory Visit: Payer: Self-pay | Admitting: Cardiology

## 2019-10-30 ENCOUNTER — Other Ambulatory Visit: Payer: Self-pay

## 2019-10-30 VITALS — BP 174/110 | HR 88 | Ht 63.0 in | Wt 148.0 lb

## 2019-10-30 DIAGNOSIS — I10 Essential (primary) hypertension: Secondary | ICD-10-CM

## 2019-10-30 DIAGNOSIS — R002 Palpitations: Secondary | ICD-10-CM

## 2019-10-30 DIAGNOSIS — R079 Chest pain, unspecified: Secondary | ICD-10-CM

## 2019-10-30 DIAGNOSIS — R0602 Shortness of breath: Secondary | ICD-10-CM

## 2019-10-30 MED ORDER — CARVEDILOL 6.25 MG PO TABS: 6.2500 mg | ORAL_TABLET | Freq: Two times a day (BID) | ORAL | 2 refills | Status: DC

## 2019-10-30 MED ORDER — METOPROLOL TARTRATE 100 MG PO TABS: 100.0000 mg | ORAL_TABLET | Freq: Once | ORAL | 0 refills | Status: DC

## 2019-10-30 NOTE — Patient Instructions (Addendum)
Medication Instructions:  Your physician has recommended you make the following change in your medication:   START COREG 6.25 mg TWICE DAILY  SEE INSTRUCTIONS BELOW FOR METOPROLOL PRIOR TO  CTA TEST  *If you need a refill on your cardiac medications before your next appointment, please call your pharmacy*  Lab work: TODAY CBC, TSH, Lipids  If you have labs (blood work) drawn today and your tests are completely normal, you will receive your results only by: Marland Kitchen MyChart Message (if you have MyChart) OR . A paper copy in the mail If you have any lab test that is abnormal or we need to change your treatment, we will call you to review the results.  Testing/Procedures:  Follow-Up: At [facility], you and your health needs are our priority.  As part of our continuing mission to provide you with exceptional heart care, we have created designated Provider Care Teams.  These Care Teams include your primary Cardiologist (physician) and Advanced Practice Providers (APPs -  Physician Assistants and Nurse Practitioners) who all work together to provide you with the care you need, when you need it.  Your next appointment:   1 month   The format for your next appointment:   In Person  Provider:   You may see Dr. Harriet Masson  or the following Advanced Practice Provider on your designated Care Team:    Laurann Montana, FNP   Other Instructions   Your physician has recommended that you wear a holter monitor. Holter monitors are medical devices that record the heart's electrical activity. Doctors most often use these monitors to diagnose arrhythmias. Arrhythmias are problems with the speed or rhythm of the heartbeat. The monitor is a small, portable device. You can wear one while you do your normal daily activities. This is usually used to diagnose what is causing palpitations/syncope (passing out).  Your physician has requested that you have an echocardiogram. Echocardiography is a painless test that  uses sound waves to create images of your heart. It provides your doctor with information about the size and shape of your heart and how well your heart's chambers and valves are working. This procedure takes approximately one hour. There are no restrictions for this procedure.    Your physician has requested that you have cardiac CT. Cardiac computed tomography (CT) is a painless test that uses an x-ray machine to take clear, detailed pictures of your heart. For further information please visit [employer]. Please follow instruction sheet as given.  Your cardiac CT will be scheduled at :   [facility] [address] [place], [place] [address] [phone]  If scheduled at [facility], please arrive at the [facility] main entrance of [facility] 30-45 minutes prior to test start time. Proceed to the [facility] Radiology Department (first floor) to check-in and test prep.   Please follow these instructions carefully (unless otherwise directed):   On the Night Before the Test: . Be sure to Drink plenty of water. . Do not consume any caffeinated/decaffeinated beverages or chocolate 12 hours prior to your test. . Do not take any antihistamines 12 hours prior to your test. . If the patient has contrast allergy:  On the Day of the Test: . Drink plenty of water. Do not drink any water within one hour of the test. . Do not eat any food 4 hours prior to the test. . You may take your regular medications prior to the test.  . Take metoprolol (Lopressor) two  hours prior to test IF HEART RATE GREATER THAN 55 . FEMALES- please wear underwire-free bra if available       After the Test: . Drink plenty of water. . After receiving IV contrast, you may experience a mild flushed feeling. This is normal. . On occasion, you may experience a mild rash up to 24 hours after the test. This is not dangerous. If this occurs, you can take Benadryl 25 mg and increase  your fluid intake. . If you experience trouble breathing, this can be serious. If it is severe call 911 IMMEDIATELY. If it is mild, please call our office.   Once we have confirmed authorization from your insurance company, we will call you to set up a date and time for your test.   For non-scheduling related questions, please contact the cardiac imaging nurse navigator should you have any questions/concerns: Rockwell Alexandria, RN Navigator Cardiac Imaging [facility] [phone] Office

## 2019-10-30 NOTE — Progress Notes (Signed)
Cardiology Office Note:    Date:  10/30/2019   ID:  [name], DOB 01-24-1971, MRN [id number]  PCP:  Pete Glatter, MD (Inactive)  Cardiologist:  No primary care provider on file.  Electrophysiologist:  None   Referring MD: Jeanie Sewer, PA-C   Chief Complaint  Patient presents with  . Hypertension  . Abnormal ECG   History of Present Illness:    Sarah Myers is a 48 y.o. female with a hx of uncontrolled hypertension, currently on amlodipine 10 mg daily, was recently evaluated at the [facility] urgency department for chest pain.  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  She did admit to associated redness of breath as well as palpitations and lightheadedness.  She notes that given this she called one of her employees to come to the store so she would be by herself.  She tells me that pain persisted therefore she ended up going to the ED to be evaluated.  In the ED the patient blood pressure was significantly elevated 196/99 mmHg.  She noted that she had not been able to afford her medications and she was discharged on amlodipine 10 mg daily.  She is here today for follow-up visit.  The patient daughter is accompanying her.  Past Medical History:  Diagnosis Date  . Hypertension     Past Surgical History:  Procedure Laterality Date  . BREAST LUMPECTOMY  1992  . CESAREAN SECTION    . ORIF RADIAL FRACTURE  02/10/2012   Procedure: OPEN REDUCTION INTERNAL FIXATION (ORIF) RADIAL FRACTURE;  Surgeon: Cammy Copa, MD;  Location: [facility] OR;  Service: Orthopedics;  Laterality: Right;  . ORIF ULNAR FRACTURE  02/10/2012   Procedure: OPEN REDUCTION INTERNAL FIXATION (ORIF) ULNAR FRACTURE;  Surgeon: Cammy Copa, MD;  Location: [facility] OR;  Service: Orthopedics;  Laterality: Right;  . TUBAL LIGATION      Current Medications:  Current Meds  Medication Sig  . amLODipine (NORVASC) 10 MG tablet Take 1 tablet (10 mg total) by mouth daily.     Allergies:   Lisinopril   Social History   Socioeconomic History  . Marital status: Married    Spouse name: Not on file  . Number of children: Not on file  . Years of education: Not on file  . Highest education level: Not on file  Occupational History  . Not on file  Social Needs  . Financial resource strain: Not on file  . Food insecurity    Worry: Not on file    Inability: Not on file  . Transportation needs    Medical: Not on file    Non-medical: Not on file  Tobacco Use  . Smoking status: Never Smoker  . Smokeless tobacco: Never Used  Substance and Sexual Activity  . Alcohol use: Not Currently    Alcohol/week: 0.0 standard drinks  . Drug use: No  . Sexual activity: Never  Lifestyle  . Physical activity    Days per week: Not on file    Minutes per session: Not on file  . Stress: Not on file  Relationships  . Social Musician on phone: Not on file    Gets together: Not on file    Attends religious service: Not on file    Active member of club or organization: Not  on file    Attends meetings of clubs or organizations: Not on file    Relationship status: Not on file  Other Topics Concern  . Not on file  Social History Narrative  . Not on file     Family History: The patient's family history includes Diabetes in her maternal aunt, maternal grandfather, maternal grandmother, and mother; Heart disease in her maternal grandfather, maternal grandmother, mother, paternal grandfather, and paternal grandmother; Hypertension in her mother; Stroke in her maternal uncle.  ROS:   Review of Systems  Constitution: Negative for decreased appetite, fever and weight gain.  HENT: Negative for congestion, ear discharge, hoarse voice and sore throat.   Eyes: Negative for discharge, redness, vision loss in right eye and visual halos.  Cardiovascular:  Reports chest pain, shortness of breath, palpitations.  Negative for leg swelling, orthopnea. Respiratory: Negative for cough, hemoptysis, shortness of breath and snoring.   Endocrine: Negative for heat intolerance and polyphagia.  Hematologic/Lymphatic: Negative for bleeding problem. Does not bruise/bleed easily.  Skin: Negative for flushing, nail changes, rash and suspicious lesions.  Musculoskeletal: Negative for arthritis, joint pain, muscle cramps, myalgias, neck pain and stiffness.  Gastrointestinal: Negative for abdominal pain, bowel incontinence, diarrhea and excessive appetite.  Genitourinary: Negative for decreased libido, genital sores and incomplete emptying.  Neurological: Negative for brief paralysis, focal weakness, headaches and loss of balance.  Psychiatric/Behavioral: Negative for altered mental status, depression and suicidal ideas.  Allergic/Immunologic: Negative for HIV exposure and persistent infections.    EKGs/Labs/Other Studies Reviewed:    The following studies were reviewed today:   EKG:  The ekg ordered today demonstrates sinus rhythm, heart rate 84 bpm, ST abnormalities in the inferolateral leads which is consistent with prior EKG, 10/26/2019.  Recent Labs: 11/16/2018: ALT 13 10/26/2019: BUN 10; Creatinine, Ser 0.75; Hemoglobin 10.6; Platelets 212; Potassium 3.2; Sodium 138  Recent Lipid Panel    Component Value Date/Time   CHOL 172 09/26/2016 1130   TRIG 97 09/26/2016 1130   HDL 70 09/26/2016 1130   CHOLHDL 2.5 09/26/2016 1130   VLDL 19 09/26/2016 1130   LDLCALC 83 09/26/2016 1130    Physical Exam:    VS:  BP (!) 174/110 (BP Location: Left Arm, Patient Position: Sitting, Cuff Size: Normal)   Pulse 88   Ht 5\' 3"  (1.6 m)   Wt 148 lb (67.1 kg)   BMI 26.22 kg/m     Wt Readings from Last 3 Encounters:  10/30/19 148 lb (67.1 kg)  11/16/18 137 lb (62.1 kg)  10/17/16 147 lb 12.8 oz (67 kg)     GEN: Well nourished, well developed in no acute  distress HEENT: Normal NECK: No JVD; No carotid bruits LYMPHATICS: No lymphadenopathy CARDIAC: S1S2 noted,RRR, no murmurs, rubs, gallops RESPIRATORY:  Clear to auscultation without rales, wheezing or rhonchi  ABDOMEN: Soft, non-tender, non-distended, +bowel sounds, no guarding. EXTREMITIES: No edema, No cyanosis, no clubbing MUSCULOSKELETAL:  No edema; No deformity  SKIN: Warm and dry NEUROLOGIC:  Alert and oriented x 3, non-focal PSYCHIATRIC:  Normal affect, good insight  ASSESSMENT:    1. Uncontrolled hypertension   2. Chest pain, unspecified type   3. Palpitations   4. Shortness of breath    PLAN:    1.  Chest pain-the patient does have risk factors for coronary disease and in light of her current symptoms and abnormal EKG I like to pursue ischemic evaluation.  At this time a CTA coronary has been ordered.  Patient denies any IV contrast dye  allergy.  She is able to proceed with this testing.  All of her questions were answered at this time.  2.  Palpitations-I would like to rule out a cardiovascular etiology of this palpitation, therefore at this time I would like to placed a zio patch for 7 days. In additon a transthoracic echocardiogram will be ordered to assess LV/RV function and any structural abnormalities. Once these testing have been performed amd reviewed further reccomendations will be made. For now, I do reccomend that the patient goes to the nearest ED if  symptoms recur.  3.  Uncontrolled hypertension-her blood pressure is elevated in the office today.  She was started on amlodipine 10 mg daily.  At this time we will add carvedilol 3.125 mg twice daily which also hopefully should help with her palpitations.  I have educated patient on the importance of taking her medications as prescribed as well as decreasing salt intake.  At the start of this new blood pressure medication I would like to see the patient in 1 month to see there is any improvement.  4.  I have discussed  with patient to understand her work and what this entails as well as if there is any symptoms when she is performing her work duties.  She tells me that she works in a [employer] store and is able to do most of her work but at this time will prefer not to push and pull any boxes.  This is not unreasonable especially especially if she is going to have any recurrent symptoms, therefore she would like to return to work on the light duty basis.  Therefore we were able to provide a note today for the patient to not push or pull any boxes that will be greater than 10 pounds.  She is scheduled to go back to work on November 01, 2019.  5.  Blood work will be performed today.  Includes CBC, TSH, lipid profile.  The patient is in agreement with the above plan. The patient left the office in stable condition.  The patient will follow up in 1 month.   Medication Adjustments/Labs and Tests Ordered: Current medicines are reviewed at length with the patient today.  Concerns regarding medicines are outlined above.  Orders Placed This Encounter  Procedures  . CT CORONARY MORPH W/CTA COR W/SCORE W/CA W/CM &/OR WO/CM  . CT CORONARY FRACTIONAL FLOW RESERVE DATA PREP  . CT CORONARY FRACTIONAL FLOW RESERVE FLUID ANALYSIS  . Lipid Profile  . CBC  . TSH  . ZIO LONG TERM MONITOR (3-14 DAYS)  . EKG 12-Lead  . ECHOCARDIOGRAM COMPLETE   Meds ordered this encounter  Medications  . carvedilol (COREG) 6.25 MG tablet    Sig: Take 1 tablet (6.25 mg total) by mouth 2 (two) times daily.    Dispense:  60 tablet    Refill:  2  . metoprolol tartrate (LOPRESSOR) 100 MG tablet    Sig: Take 1 tablet (100 mg total) by mouth once for 1 dose. Take 1 tablet (100 mg) by mouth once 2 hours prior to Cardiac CT if heart rate is greater than 55    Dispense:  1 tablet    Refill:  0    Patient Instructions  Medication Instructions:  Your physician has recommended you make the following change in your medication:   START COREG 6.25  mg TWICE DAILY  SEE INSTRUCTIONS BELOW FOR METOPROLOL PRIOR TO  CTA TEST  *If you need a refill on your cardiac medications  before your next appointment, please call your pharmacy*  Lab work: TODAY CBC, TSH, Lipids  If you have labs (blood work) drawn today and your tests are completely normal, you will receive your results only by: Marland Kitchen MyChart Message (if you have MyChart) OR . A paper copy in the mail If you have any lab test that is abnormal or we need to change your treatment, we will call you to review the results.  Testing/Procedures:  Follow-Up: At [facility], you and your health needs are our priority.  As part of our continuing mission to provide you with exceptional heart care, we have created designated Provider Care Teams.  These Care Teams include your primary Cardiologist (physician) and Advanced Practice Providers (APPs -  Physician Assistants and Nurse Practitioners) who all work together to provide you with the care you need, when you need it.  Your next appointment:   1 month   The format for your next appointment:   In Person  Provider:   You may see Dr. Harriet Masson  or the following Advanced Practice Provider on your designated Care Team:    Laurann Montana, FNP   Other Instructions   Your physician has recommended that you wear a holter monitor. Holter monitors are medical devices that record the heart's electrical activity. Doctors most often use these monitors to diagnose arrhythmias. Arrhythmias are problems with the speed or rhythm of the heartbeat. The monitor is a small, portable device. You can wear one while you do your normal daily activities. This is usually used to diagnose what is causing palpitations/syncope (passing out).  Your physician has requested that you have an echocardiogram. Echocardiography is a painless test that uses sound waves to create images of your heart. It provides your doctor with information about the size and shape of your heart  and how well your heart's chambers and valves are working. This procedure takes approximately one hour. There are no restrictions for this procedure.    Your physician has requested that you have cardiac CT. Cardiac computed tomography (CT) is a painless test that uses an x-ray machine to take clear, detailed pictures of your heart. For further information please visit [facility]tn. Please follow instruction sheet as given.  Your cardiac CT will be scheduled at :   [facility] [address] [place], [place] [address] [phone]  If scheduled at [facility], please arrive at the [facility] main entrance of [facility] 30-45 minutes prior to test start time. Proceed to the [facility] Radiology Department (first floor) to check-in and test prep.   Please follow these instructions carefully (unless otherwise directed):   On the Night Before the Test: . Be sure to Drink plenty of water. . Do not consume any caffeinated/decaffeinated beverages or chocolate 12 hours prior to your test. . Do not take any antihistamines 12 hours prior to your test. . If the patient has contrast allergy:  On the Day of the Test: . Drink plenty of water. Do not drink any water within one hour of the test. . Do not eat any food 4 hours prior to the test. . You may take your regular medications prior to the test.  . Take metoprolol (Lopressor) two hours prior to test. . FEMALES- please wear underwire-free bra if available   *For Clinical Staff only. Please instruct patient the following:*        -Drink plenty of water       -Hold Furosemide/hydrochlorothiazide morning of  the test       -Take metoprolol (Lopressor) 2 hours prior to test (if applicable).                  -If HR is less than 55 BPM- Do Not Take Lopressor                -IF HR is greater than 55 BPM and patient is less than or equal to 62 yrs old Lopressor 100mg  x1       After the Test: . Drink  plenty of water. . After receiving IV contrast, you may experience a mild flushed feeling. This is normal. . On occasion, you may experience a mild rash up to 24 hours after the test. This is not dangerous. If this occurs, you can take Benadryl 25 mg and increase your fluid intake. . If you experience trouble breathing, this can be serious. If it is severe call 911 IMMEDIATELY. If it is mild, please call our office.   Once we have confirmed authorization from your insurance company, we will call you to set up a date and time for your test.   For non-scheduling related questions, please contact the cardiac imaging nurse navigator should you have any questions/concerns: , RN Navigator Cardiac Imaging [facility] [phone] Office          Adopting a Healthy Lifestyle.  Know what a healthy weight is for you (roughly BMI <25) and aim to maintain this   Aim for 7+ servings of fruits and vegetables daily   65-80+ fluid ounces of water or unsweet tea for healthy kidneys   Limit to max 1 drink of alcohol per day; avoid smoking/tobacco   Limit animal fats in diet for cholesterol and heart health - choose grass fed whenever available   Avoid highly processed foods, and foods high in saturated/trans fats   Aim for low stress - take time to unwind and care for your mental health   Aim for 150 min of moderate intensity exercise weekly for heart health, and weights twice weekly for bone health   Aim for 7-9 hours of sleep daily   When it comes to diets, agreement about the perfect plan isnt easy to find, even among the experts. Experts at the [facility] of [facility] developed an idea known as the Healthy Eating Plate. Just imagine a plate divided into logical, healthy portions.   The emphasis is on diet quality:   Load up on vegetables and fruits - one-half of your plate: Aim for color and variety, and remember that potatoes dont count.    Go for whole grains - one-quarter of your plate: Whole wheat, barley, wheat berries, quinoa, oats, brown rice, and foods made with them. If you want pasta, go with whole wheat pasta.   Protein power - one-quarter of your plate: Fish, chicken, beans, and nuts are all healthy, versatile protein sources. Limit red meat.   The diet, however, does go beyond the plate, offering a few other suggestions.   Use healthy plant oils, such as olive, canola, soy, corn, sunflower and peanut. Check the labels, and avoid partially hydrogenated oil, which have unhealthy trans fats.   If youre thirsty, drink water. Coffee and tea are good in moderation, but skip sugary drinks and limit milk and dairy products to one or two daily servings.   The type of carbohydrate in the diet is more important than the amount. Some sources of carbohydrates,  such as vegetables, fruits, whole grains, and beans-are healthier than others.   Finally, stay active  Signed, Thomasene Ripple, DO  10/30/2019 11:47 AM    [facility]

## 2019-10-31 LAB — CBC
Hematocrit: 31 % — ABNORMAL LOW (ref 34.0–46.6)
Hemoglobin: 9.1 g/dL — ABNORMAL LOW (ref 11.1–15.9)
MCH: 19.2 pg — ABNORMAL LOW (ref 26.6–33.0)
MCHC: 29.4 g/dL — ABNORMAL LOW (ref 31.5–35.7)
MCV: 65 fL — ABNORMAL LOW (ref 79–97)
Platelets: 198 10*3/uL (ref 150–450)
RBC: 4.75 x10E6/uL (ref 3.77–5.28)
RDW: 22.3 % — ABNORMAL HIGH (ref 11.7–15.4)
WBC: 7.8 10*3/uL (ref 3.4–10.8)

## 2019-10-31 LAB — LIPID PANEL
Chol/HDL Ratio: 2.7 ratio (ref 0.0–4.4)
Cholesterol, Total: 169 mg/dL (ref 100–199)
HDL: 62 mg/dL (ref >39)
LDL Chol Calc (NIH): 84 mg/dL (ref 0–99)
Triglycerides: 134 mg/dL (ref 0–149)
VLDL Cholesterol Cal: 23 mg/dL (ref 5–40)

## 2019-10-31 LAB — TSH: TSH: 1.27 u[IU]/mL (ref 0.450–4.500)

## 2019-11-03 ENCOUNTER — Ambulatory Visit (HOSPITAL_BASED_OUTPATIENT_CLINIC_OR_DEPARTMENT_OTHER)
Admission: RE | Admit: 2019-11-03 | Discharge: 2019-11-03 | Disposition: A | Discharge status: Home / Self Care | Payer: Self-pay | Source: Ambulatory Visit | Attending: Cardiology | Admitting: Cardiology

## 2019-11-03 ENCOUNTER — Encounter: Payer: Self-pay | Admitting: *Deleted

## 2019-11-03 ENCOUNTER — Telehealth: Payer: Self-pay

## 2019-11-03 ENCOUNTER — Other Ambulatory Visit: Payer: Self-pay

## 2019-11-03 DIAGNOSIS — R0602 Shortness of breath: Secondary | ICD-10-CM | POA: Insufficient documentation

## 2019-11-03 DIAGNOSIS — R079 Chest pain, unspecified: Secondary | ICD-10-CM | POA: Insufficient documentation

## 2019-11-03 DIAGNOSIS — R002 Palpitations: Secondary | ICD-10-CM | POA: Insufficient documentation

## 2019-11-03 NOTE — Progress Notes (Signed)
  Echocardiogram 2D Echocardiogram has been performed.  Cardell Peach 11/03/2019, 1:35 PM

## 2019-11-03 NOTE — Telephone Encounter (Signed)
Contacted pt to ensure she received AVS after 10/30/19  office visit. States she received them but has questions about where and when its scheduled and where her echocardiogram is scheduled today.   Informed echocardiogram is scheduled at [facility] at 1:15 today, 1st floor [address] and that CTA has been sent to insurance for prior auth, when authorization is received, we will contact her to schedule test which will take place at [facility] for lab results from recent visit. Results have not been released yet, informed pt will will contact her once Dr. Harriet Masson has received them.    No further questions or concerns verbalized.

## 2019-11-05 ENCOUNTER — Telehealth: Payer: Self-pay | Admitting: Cardiology

## 2019-11-05 NOTE — Telephone Encounter (Signed)
Telephone call to patient. Informed that Dr Harriet Masson is out today and I will speak her her tomorrow about the note. Patient agreeable to plan

## 2019-11-05 NOTE — Telephone Encounter (Signed)
Pt. Requesting to speak with Dr. Harriet Masson. She says that she just went back to work and she is having pain and would like to be written out of work for another week. She said she called out of work today and she had a rough night.

## 2019-11-06 ENCOUNTER — Encounter: Payer: Self-pay | Admitting: *Deleted

## 2019-11-06 DIAGNOSIS — R002 Palpitations: Secondary | ICD-10-CM

## 2019-11-06 NOTE — Telephone Encounter (Signed)
Spoke with Dr Harriet Masson. Ok to be out until next office visit 11/27/19. Called patient and told her she can pick up note today or tomorrow.

## 2019-11-10 ENCOUNTER — Inpatient Hospital Stay: Payer: Medicaid Other

## 2019-11-12 ENCOUNTER — Telehealth: Payer: Self-pay | Admitting: *Deleted

## 2019-11-12 NOTE — Telephone Encounter (Signed)
Left message that [employer] fills out FMLA paperwork  Not Dr Harriet Masson and there is a $25 form filling out fee that must be paid 1st by check or money order. Told to call with any questions.

## 2019-11-27 ENCOUNTER — Encounter: Payer: Self-pay | Admitting: Cardiology

## 2019-11-27 ENCOUNTER — Other Ambulatory Visit: Payer: Self-pay

## 2019-11-27 ENCOUNTER — Encounter: Payer: Self-pay | Admitting: *Deleted

## 2019-11-27 ENCOUNTER — Ambulatory Visit (INDEPENDENT_AMBULATORY_CARE_PROVIDER_SITE_OTHER): Payer: Self-pay | Admitting: Cardiology

## 2019-11-27 VITALS — BP 148/98 | HR 93 | Ht 63.0 in | Wt 154.0 lb

## 2019-11-27 DIAGNOSIS — I493 Ventricular premature depolarization: Secondary | ICD-10-CM

## 2019-11-27 DIAGNOSIS — I491 Atrial premature depolarization: Secondary | ICD-10-CM

## 2019-11-27 DIAGNOSIS — I10 Essential (primary) hypertension: Secondary | ICD-10-CM

## 2019-11-27 MED ORDER — CARVEDILOL 12.5 MG PO TABS: 12.5000 mg | ORAL_TABLET | Freq: Two times a day (BID) | ORAL | 1 refills | Status: DC

## 2019-11-27 NOTE — Progress Notes (Signed)
Cardiology Office Note:    Date:  11/27/2019   ID:  Sarah Myers, DOB [birth year], MRN [id number]  PCP:  Pete Glatter, MD (Inactive)  Cardiologist:  Thomasene Ripple, DO  Electrophysiologist:  None   Referring MD: No ref. provider found   The patient is here for a follow up visit.  History of Present Illness:    Sarah Myers is a 48 y.o. female with a hx of uncontrolled hypertension. I saw the patient on 10/30/2019 after a ED visit for hypertension. At that time complained of chest pain and was hypertensive in the office. I added coreg to her regimen, recommended an echocardiogram and a coronary CTA . In addition she complained of palpitations, therefore I placed a zio monitor on her.   In the interim she was able to get her TTE and wore the zio monitor. She is her today for a follow up visit.   Past Medical History:  Diagnosis Date  . Hypertension     Past Surgical History:  Procedure Laterality Date  . BREAST LUMPECTOMY  1992  . CESAREAN SECTION    . ORIF RADIAL FRACTURE  02/10/2012   Procedure: OPEN REDUCTION INTERNAL FIXATION (ORIF) RADIAL FRACTURE;  Surgeon: Cammy Copa, MD;  Location: [facility] OR;  Service: Orthopedics;  Laterality: Right;  . ORIF ULNAR FRACTURE  02/10/2012   Procedure: OPEN REDUCTION INTERNAL FIXATION (ORIF) ULNAR FRACTURE;  Surgeon: Cammy Copa, MD;  Location: [facility] OR;  Service: Orthopedics;  Laterality: Right;  . TUBAL LIGATION      Current Medications: Current Meds  Medication Sig  . amLODipine (NORVASC) 10 MG tablet Take 1 tablet (10 mg total) by mouth daily.  . [DISCONTINUED] carvedilol (COREG) 6.25 MG tablet Take 1 tablet (6.25 mg total) by mouth 2 (two) times daily.     Allergies:   Lisinopril   Social History   Socioeconomic History  . Marital status: Married    Spouse name: Not on file  . Number of children: Not on file  . Years of education: Not on file  . Highest education level: Not on file  Occupational History   . Not on file  Tobacco Use  . Smoking status: Never Smoker  . Smokeless tobacco: Never Used  Substance and Sexual Activity  . Alcohol use: Not Currently    Alcohol/week: 0.0 standard drinks  . Drug use: No  . Sexual activity: Never  Other Topics Concern  . Not on file  Social History Narrative  . Not on file   Social Determinants of Health   Financial Resource Strain:   . Difficulty of Paying Living Expenses: Not on file  Food Insecurity:   . Worried About Programme researcher, broadcasting/film/video in the Last Year: Not on file  . Ran Out of Food in the Last Year: Not on file  Transportation Needs:   . Lack of Transportation (Medical): Not on file  . Lack of Transportation (Non-Medical): Not on file  Physical Activity:   . Days of Exercise per Week: Not on file  . Minutes of Exercise per Session: Not on file  Stress:   . Feeling of Stress : Not on file  Social Connections:   . Frequency of Communication with Friends and Family: Not on file  . Frequency of Social Gatherings with Friends and Family: Not on file  . Attends Religious Services: Not on file  . Active Member of Clubs or Organizations: Not on file  . Attends Banker Meetings:  Not on file  . Marital Status: Not on file     Family History: The patient's family history includes Diabetes in her maternal aunt, maternal grandfather, maternal grandmother, and mother; Heart disease in her maternal grandfather, maternal grandmother, mother, paternal grandfather, and paternal grandmother; Hypertension in her mother; Stroke in her maternal uncle.  ROS:   Review of Systems  Constitution: Negative for decreased appetite, fever and weight gain.  HENT: Negative for congestion, ear discharge, hoarse voice and sore throat.   Eyes: Negative for discharge, redness, vision loss in right eye and visual halos.  Cardiovascular: Negative for chest pain, dyspnea on exertion, leg swelling, orthopnea and palpitations.  Respiratory: Negative for  cough, hemoptysis, shortness of breath and snoring.   Endocrine: Negative for heat intolerance and polyphagia.  Hematologic/Lymphatic: Negative for bleeding problem. Does not bruise/bleed easily.  Skin: Negative for flushing, nail changes, rash and suspicious lesions.  Musculoskeletal: Negative for arthritis, joint pain, muscle cramps, myalgias, neck pain and stiffness.  Gastrointestinal: Negative for abdominal pain, bowel incontinence, diarrhea and excessive appetite.  Genitourinary: Negative for decreased libido, genital sores and incomplete emptying.  Neurological: Negative for brief paralysis, focal weakness, headaches and loss of balance.  Psychiatric/Behavioral: Negative for altered mental status, depression and suicidal ideas.  Allergic/Immunologic: Negative for HIV exposure and persistent infections.    EKGs/Labs/Other Studies Reviewed:    The following studies were reviewed today:   EKG:  None today  TTE  Impression:  1. Left ventricular ejection fraction, by visual estimation, is 60 to 65%. The left ventricle has normal function. There is moderately increased left ventricular hypertrophy.  2. Left ventricular diastolic parameters are consistent with Grade II diastolic dysfunction (pseudonormalization).  3. Left atrial size was mildly dilated.  FINDINGS  Left Ventricle: Left ventricular ejection fraction, by visual estimation, is 60 to 65%. The left ventricle has normal function. There is moderately increased left ventricular hypertrophy. Left ventricular diastolic parameters are consistent with Grade  II diastolic dysfunction (pseudonormalization). Normal left atrial pressure.  Right Ventricle: The right ventricular size is normal. No increase in right ventricular wall thickness. Global RV systolic function is has normal systolic function. The tricuspid regurgitant velocity is 2.48 m/s, and with an assumed right atrial pressure  of 3 mmHg, the estimated right ventricular  systolic pressure is normal at 27.7 mmHg.  Left Atrium: Left atrial size was mildly dilated.  Right Atrium: Right atrial size was normal in size  Pericardium: There is no evidence of pericardial effusion.  Mitral Valve: The mitral valve is normal in structure. No evidence of mitral valve stenosis by observation. No evidence of mitral valve regurgitation.  Tricuspid Valve: The tricuspid valve is normal in structure. Tricuspid valve regurgitation is not demonstrated.  Aortic Valve: The aortic valve is normal in structure. Aortic valve regurgitation is not visualized. The aortic valve is structurally normal, with no evidence of sclerosis or stenosis.  Pulmonic Valve: The pulmonic valve was normal in structure. Pulmonic valve regurgitation is not visualized.  Aorta: The aortic root, ascending aorta and aortic arch are all structurally normal, with no evidence of dilitation or obstruction.  Venous: The inferior vena cava is normal in size with greater than 50% respiratory variability, suggesting right atrial pressure of 3 mmHg.  IAS/Shunts: No atrial level shunt detected by color flow Doppler. No ventricular septal defect is seen or detected. There is no evidence of an atrial septal defect.    Zio monitor: The patient wore the monitor for 7 days, starting 10/30/2019. Indication:  Palpitation The minimum heart rate was 55 bpm, maximum heart rate was 150 bpm, and average heart rate was 81 bpm.  The predominant underlying rhythm was Sinus Rhythm.  3 Supraventricular Tachycardia runs occurred, the run with the fastest interval lasting 25.8 secs with a max rate of 135 bpm (avg 116 bpm); the run with the fastest interval was also the longest.  Premature atrial complexes were rare (<1.0%). Premature ventricular complexes were rare (<1.0%).  34 patient triggered event: 7 events associated with Premature atrial complexes and 5 events with premature ventricular complexes.  No Ventricular  tachycardia, No AV block , No pauses and no atrial fibrillation.  Conclusion: Study is remarkable for symptomatic premature atrial complexes, symptomatic premature ventricular complexes and asymptomatic paroxysmal supraventricular tachycardia.   Recent Labs: 10/26/2019: BUN 10; Creatinine, Ser 0.75; Potassium 3.2; Sodium 138 10/30/2019: Hemoglobin 9.1; Platelets 198; TSH 1.270  Recent Lipid Panel    Component Value Date/Time   CHOL 169 10/30/2019 1136   TRIG 134 10/30/2019 1136   HDL 62 10/30/2019 1136   CHOLHDL 2.7 10/30/2019 1136   CHOLHDL 2.5 09/26/2016 1130   VLDL 19 09/26/2016 1130   LDLCALC 84 10/30/2019 1136    Physical Exam:    VS:  BP (!) 148/98   Pulse 93   Ht 5\' 3"  (1.6 m)   Wt 154 lb 0.6 oz (69.9 kg)   SpO2 98%   BMI 27.29 kg/m     Wt Readings from Last 3 Encounters:  11/27/19 154 lb 0.6 oz (69.9 kg)  10/30/19 148 lb (67.1 kg)  11/16/18 137 lb (62.1 kg)     GEN: Well nourished, well developed in no acute distress HEENT: Normal NECK: No JVD; No carotid bruits LYMPHATICS: No lymphadenopathy CARDIAC: S1S2 noted,RRR, no murmurs, rubs, gallops RESPIRATORY:  Clear to auscultation without rales, wheezing or rhonchi  ABDOMEN: Soft, non-tender, non-distended, +bowel sounds, no guarding. EXTREMITIES: No edema, No cyanosis, no clubbing MUSCULOSKELETAL:  No edema; No deformity  SKIN: Warm and dry NEUROLOGIC:  Alert and oriented x 3, non-focal PSYCHIATRIC:  Normal affect, good insight  ASSESSMENT:    1. Uncontrolled hypertension   2. Symptomatic PVCs   3. PAC (premature atrial contraction)    PLAN:    1.  Blood pressure is elevated in the office today, however this is an improvement compared to her last visit.  She tells me she did not take her antihypertensives this morning.  My independent review of the patient's echocardiogram showed a severe left ventricular hypertrophy.  I did speak to the patient about this in great detail educating her that a strict  blood pressure control with keeping a target less than 130/80 would be very important.  For this I am going to increase her carvedilol to 12.5 mg twice daily.  In addition ZIO monitor showed evidence of symptomatic PVCs and PACs which I am hoping increasing her beta-blocker will also help with symptomatic control at this time.  Her CTA coronaries is still pending-we have reached out to scheduling help the patient get this scheduled as soon as possible.  I discussed with the patient based on my assessment today, I do believe that she may be able to work as tolerated.  Did express that she has been experiencing anxiety at work.  I also have asked her that she would need to be evaluated for her anxiety which will be starting with getting a primary care provider.  She does not have a PCP therefore I have given a referral  for primary care within our system.  The patient is in agreement with the above plan. The patient left the office in stable condition.  The patient will follow up in 3 months or sooner if needed   Medication Adjustments/Labs and Tests Ordered: Current medicines are reviewed at length with the patient today.  Concerns regarding medicines are outlined above.  Orders Placed This Encounter  Procedures  . Ambulatory referral to [facility]   Meds ordered this encounter  Medications  . carvedilol (COREG) 12.5 MG tablet    Sig: Take 1 tablet (12.5 mg total) by mouth 2 (two) times daily.    Dispense:  180 tablet    Refill:  1    Patient Instructions  Medication Instructions:  Your physician has recommended you make the following change in your medication:   INCREASE: COREG(carvedilol) to 12.5 mg Take 1 tab twice daily( may take 2 tabs of 6.25 mg Twice daily until you run out)  *If you need a refill on your cardiac medications before your next appointment, please call your pharmacy*  Lab Work: NOne If you have labs (blood work) drawn today and your tests are completely  normal, you will receive your results only by: Marland Kitchen MyChart Message (if you have MyChart) OR . A paper copy in the mail If you have any lab test that is abnormal or we need to change your treatment, we will call you to review the results.  Testing/Procedures: None  Follow-Up: At [facility], you and your health needs are our priority.  As part of our continuing mission to provide you with exceptional heart care, we have created designated Provider Care Teams.  These Care Teams include your primary Cardiologist (physician) and Advanced Practice Providers (APPs -  Physician Assistants and Nurse Practitioners) who all work together to provide you with the care you need, when you need it.  Your next appointment:   3 month(s)  The format for your next appointment:   In Person  Provider:   Thomasene Ripple, DO  Other Instructions      Adopting a Healthy Lifestyle.  Know what a healthy weight is for you (roughly BMI <25) and aim to maintain this   Aim for 7+ servings of fruits and vegetables daily   65-80+ fluid ounces of water or unsweet tea for healthy kidneys   Limit to max 1 drink of alcohol per day; avoid smoking/tobacco   Limit animal fats in diet for cholesterol and heart health - choose grass fed whenever available   Avoid highly processed foods, and foods high in saturated/trans fats   Aim for low stress - take time to unwind and care for your mental health   Aim for 150 min of moderate intensity exercise weekly for heart health, and weights twice weekly for bone health   Aim for 7-9 hours of sleep daily   When it comes to diets, agreement about the perfect plan isnt easy to find, even among the experts. Experts at the [facility] [employer] developed an idea known as the Healthy Eating Plate. Just imagine a plate divided into logical, healthy portions.   The emphasis is on diet quality:   Load up on vegetables and fruits - one-half of your plate: Aim for  color and variety, and remember that potatoes dont count.   Go for whole grains - one-quarter of your plate: Whole wheat, barley, wheat berries, quinoa, oats, brown rice, and foods made with them. If you want pasta, go  with whole wheat pasta.   Protein power - one-quarter of your plate: Fish, chicken, beans, and nuts are all healthy, versatile protein sources. Limit red meat.   The diet, however, does go beyond the plate, offering a few other suggestions.   Use healthy plant oils, such as olive, canola, soy, corn, sunflower and peanut. Check the labels, and avoid partially hydrogenated oil, which have unhealthy trans fats.   If youre thirsty, drink water. Coffee and tea are good in moderation, but skip sugary drinks and limit milk and dairy products to one or two daily servings.   The type of carbohydrate in the diet is more important than the amount. Some sources of carbohydrates, such as vegetables, fruits, whole grains, and beans-are healthier than others.   Finally, stay active  Signed, Berniece Salines, DO  11/27/2019 9:26 PM    [place]

## 2019-11-27 NOTE — Patient Instructions (Signed)
Medication Instructions:  Your physician has recommended you make the following change in your medication:   INCREASE: COREG(carvedilol) to 12.5 mg Take 1 tab twice daily( may take 2 tabs of 6.25 mg Twice daily until you run out)  *If you need a refill on your cardiac medications before your next appointment, please call your pharmacy*  Lab Work: NOne If you have labs (blood work) drawn today and your tests are completely normal, you will receive your results only by: Marland Kitchen MyChart Message (if you have MyChart) OR . A paper copy in the mail If you have any lab test that is abnormal or we need to change your treatment, we will call you to review the results.  Testing/Procedures: None  Follow-Up: At [facility], you and your health needs are our priority.  As part of our continuing mission to provide you with exceptional heart care, we have created designated Provider Care Teams.  These Care Teams include your primary Cardiologist (physician) and Advanced Practice Providers (APPs -  Physician Assistants and Nurse Practitioners) who all work together to provide you with the care you need, when you need it.  Your next appointment:   3 month(s)  The format for your next appointment:   In Person  Provider:   Berniece Salines, DO  Other Instructions

## 2020-01-05 ENCOUNTER — Telehealth (HOSPITAL_COMMUNITY): Payer: Self-pay | Admitting: Emergency Medicine

## 2020-01-05 NOTE — Telephone Encounter (Signed)
Left message on voicemail with name and callback number Roben Tatsch RN Navigator Cardiac [facility] [phone] Office [phone] Cell  

## 2020-01-06 ENCOUNTER — Other Ambulatory Visit: Payer: Self-pay

## 2020-01-06 ENCOUNTER — Ambulatory Visit (HOSPITAL_COMMUNITY)
Admission: RE | Admit: 2020-01-06 | Discharge: 2020-01-06 | Disposition: A | Discharge status: Home / Self Care | Payer: Self-pay | Source: Ambulatory Visit | Attending: Cardiology | Admitting: Cardiology

## 2020-01-06 DIAGNOSIS — R079 Chest pain, unspecified: Secondary | ICD-10-CM

## 2020-01-06 DIAGNOSIS — R002 Palpitations: Secondary | ICD-10-CM | POA: Insufficient documentation

## 2020-01-06 DIAGNOSIS — Z006 Encounter for examination for normal comparison and control in clinical research program: Secondary | ICD-10-CM

## 2020-01-06 DIAGNOSIS — R0602 Shortness of breath: Secondary | ICD-10-CM | POA: Insufficient documentation

## 2020-01-06 MED ORDER — NITROGLYCERIN 0.4 MG SL SUBL
SUBLINGUAL_TABLET | SUBLINGUAL | Status: AC
  Administered 2020-01-06: 0.8 mg
  Filled 2020-01-06: qty 2

## 2020-01-06 MED ORDER — IOHEXOL 350 MG/ML SOLN
80.0000 mL | Freq: Once | INTRAVENOUS | Status: AC | PRN
  Administered 2020-01-06: 16:00:00 80 mL via INTRAVENOUS

## 2020-01-06 NOTE — Research (Signed)
Cadfem Informed Consent    Patient Name: Sarah Myers   Subject met inclusion and exclusion criteria.  The informed consent form, study requirements and expectations were reviewed with the subject and questions and concerns were addressed prior to the signing of the consent form.  The subject verbalized understanding of the trail requirements.  The subject agreed to participate in the CADFEM trial and signed the informed consent.  The informed consent was obtained prior to performance of any protocol-specific procedures for the subject.  A copy of the signed informed consent was given to the subject and a copy was placed in the subject's medical record.   [name]

## 2020-01-08 ENCOUNTER — Telehealth: Payer: Self-pay

## 2020-01-08 NOTE — Telephone Encounter (Signed)
Results relayed. 

## 2020-01-08 NOTE — Telephone Encounter (Signed)
-----   Message from Thomasene Ripple, DO sent at 01/07/2020  7:46 PM EST ----- Your coronary CTA is normal - no blockages.

## 2020-03-16 ENCOUNTER — Ambulatory Visit: Payer: Medicaid Other | Admitting: Family Medicine

## 2020-03-30 ENCOUNTER — Ambulatory Visit: Payer: Medicaid Other | Admitting: Family Medicine

## 2020-04-20 ENCOUNTER — Telehealth: Payer: Self-pay | Admitting: *Deleted

## 2020-04-20 NOTE — Telephone Encounter (Signed)
Called patient for 90 day phone call follow up. Left message for her to call or e-mail me back.        [place] Bowen Kia  04/20/2020  10:49 a.m.

## 2020-05-18 ENCOUNTER — Telehealth (INDEPENDENT_AMBULATORY_CARE_PROVIDER_SITE_OTHER): Payer: Self-pay | Admitting: Internal Medicine

## 2020-05-18 ENCOUNTER — Encounter: Payer: Self-pay | Admitting: Internal Medicine

## 2020-05-18 DIAGNOSIS — Z7689 Persons encountering health services in other specified circumstances: Secondary | ICD-10-CM

## 2020-05-18 DIAGNOSIS — R0683 Snoring: Secondary | ICD-10-CM

## 2020-05-18 DIAGNOSIS — F411 Generalized anxiety disorder: Secondary | ICD-10-CM

## 2020-05-18 NOTE — Progress Notes (Signed)
Virtual Visit via Telephone Note  I connected with Sarah Myers, on 05/18/2020 at 3:14 PM by telephone due to the COVID-19 pandemic and verified that I am speaking with the correct person using two identifiers.   Consent: I discussed the limitations, risks, security and privacy concerns of performing an evaluation and management service by telephone and the availability of in person appointments. I also discussed with the patient that there may be a patient responsible charge related to this service. The patient expressed understanding and agreed to proceed.   Location of Patient: Home   Location of Provider: Clinic    Persons participating in Telemedicine visit: Sima T Shawntee Mainwaring [facility] Dr. Juleen China      History of Present Illness: Patient has a visit to establish care. PMH of HTN on Coreg. Does not monitor BPs at home.   She has concerns about anxiety. Has been having symptoms since November/December 2020. Symptoms of feeling on edge and worrying. Is having panic attacks once per week sometimes less depending on stress level, this has improved and lessened in frequency. Has never been on medications in the past. She is not interested in starting anything but is willing to talk to Barbette Hair, LCSW.   Problems with waking up at night with trouble breathing. Does snore at nighttime. Has been occurring for a while but worsening over the past year. Occasional morning headaches. Feels fatigued throughout the day. Has never had a sleep study performed. No PMH of asthma or COPD.   GAD 7 : Generalized Anxiety Score 05/18/2020 10/17/2016 09/26/2016  Nervous, Anxious, on Edge 2 1 2   Control/stop worrying 2 (No Data) 2  Worry too much - different things 1 3 2   Trouble relaxing 1 0 0  Restless 0 0 0  Easily annoyed or irritable 1 3 2   Afraid - awful might happen 0 3 1  Total GAD 7 Score 7 - 9    Past Medical History:  Diagnosis Date   Hypertension    Allergies   Allergen Reactions   Lisinopril Cough    Current Outpatient Medications on File Prior to Visit  Medication Sig Dispense Refill   carvedilol (COREG) 12.5 MG tablet Take 1 tablet (12.5 mg total) by mouth 2 (two) times daily. 180 tablet 1   No current facility-administered medications on file prior to visit.    Observations/Objective: NAD. Speaking clearly.  Work of breathing normal.  Alert and oriented. Mood appropriate.   Assessment and Plan: 1. Encounter to establish care Reviewed patient's PMH, social history, surgical history, and medications.  Is overdue for annual exam, screening blood work, and health maintenance topics. Have asked patient to return for visit to address these items.   2. GAD (generalized anxiety disorder) GAD-7 score is elevated. Does not want to try medications but is open to counseling. Will schedule with Christa See, LCSW.   3. Snoring Patient has symptoms concerning for sleep apnea that warrants a sleep study for further evaluation.  - PSG Sleep Study; Future   Follow Up Instructions: LCSW 6/9; Annual exam    I discussed the assessment and treatment plan with the patient. The patient was provided an opportunity to ask questions and all were answered. The patient agreed with the plan and demonstrated an understanding of the instructions.   The patient was advised to call back or seek an in-person evaluation if the symptoms worsen or if the condition fails to improve as anticipated.     I provided 12 minutes  total of non-face-to-face time during this encounter including median intraservice time, reviewing previous notes, investigations, ordering medications, medical decision making, coordinating care and patient verbalized understanding at the end of the visit.    Marcy Siren, D.O. Primary Care at [facility]  05/18/2020, 3:14 PM

## 2020-05-26 ENCOUNTER — Ambulatory Visit: Payer: Self-pay | Attending: Family Medicine | Admitting: Licensed Clinical Social Worker

## 2020-05-26 ENCOUNTER — Other Ambulatory Visit: Payer: Self-pay

## 2020-05-26 DIAGNOSIS — F419 Anxiety disorder, unspecified: Secondary | ICD-10-CM

## 2020-05-26 NOTE — BH Specialist Note (Signed)
Integrated Behavioral Health Visit via Telemedicine (Telephone)  05/26/2020 Sarah Myers [id number]   Session Start time: 10:10 AM  Session End time: 10:55 AM Total time: 45   Referring Provider: Dr. Earlene Plater Type of Visit: Telephonic Patient location: Home [facility] Provider location: Office All persons participating in visit: LCSW and Patient  Confirmed patient's address: Yes  Confirmed patient's phone number: Yes  Any changes to demographics: No   Confirmed patient's insurance: Yes  Any changes to patient's insurance: No   Discussed confidentiality: Yes    The following statements were read to the patient and/or legal guardian that are established with the [facility] Provider.  "The purpose of this phone visit is to provide behavioral health care while limiting exposure to the coronavirus (COVID19).  There is a possibility of technology failure and discussed alternative modes of communication if that failure occurs."  "By engaging in this telephone visit, you consent to the provision of healthcare.  Additionally, you authorize for your insurance to be billed for the services provided during this telephone visit."   Patient and/or legal guardian consented to telephone visit: Yes   PRESENTING CONCERNS: Patient and/or family reports the following symptoms/concerns: Pt reports increase in depression and anxiety symptoms triggered by psychosocial stressors Duration of problem: Ongoing; Severity of problem: mild  STRENGTHS (Protective Factors/Coping Skills): Pt has good insight Pt has desire to change Pt has strong support system  GOALS ADDRESSED: Patient will: 1.  Reduce symptoms of: anxiety and depression  2.  Increase knowledge and/or ability of: coping skills and healthy habits  3.  Demonstrate ability to: Increase healthy adjustment to current life circumstances  INTERVENTIONS: Interventions utilized:  Solution-Focused Strategies, Supportive Counseling and  Psychoeducation and/or Health Education Standardized Assessments completed: Not Needed  ASSESSMENT: Patient currently experiencing increase in depression and anxiety symptoms triggered by psychosocial stressors.   Patient may benefit from therapy to strengthen support system. Validation and encouragement provided. Therapeutic strategies discussed to assist in management and/or decrease of symptoms.  PLAN: 1. Follow up with behavioral health clinician on : 06/01/2020 2. Behavioral recommendations: Utilize strategies discussed 3. Referral(s): [facility] [employer] (In Clinic)  Bridgett Larsson, [place] 05/27/2020 10:40 AM

## 2020-06-01 ENCOUNTER — Ambulatory Visit (INDEPENDENT_AMBULATORY_CARE_PROVIDER_SITE_OTHER): Payer: Medicaid Other | Admitting: Licensed Clinical Social Worker

## 2020-06-01 DIAGNOSIS — F411 Generalized anxiety disorder: Secondary | ICD-10-CM

## 2020-06-01 NOTE — BH Specialist Note (Signed)
Integrated Behavioral Health Visit via Telemedicine (Telephone)  06/01/2020 Sarah Myers [id number]   Session Start time: 1:45 PM  Session End time: 2:30 PM Total time: 45   Referring Provider: NP Randa Evens Type of Visit: Telephonic Patient location: Home [facility] Provider location: Office All persons participating in visit: LCSW and Patient  Confirmed patient's address: Yes  Confirmed patient's phone number: Yes  Any changes to demographics: No   Confirmed patient's insurance: Yes  Any changes to patient's insurance: No   Discussed confidentiality: Yes    The following statements were read to the patient and/or legal guardian that are established with the [facility] Provider.  "The purpose of this phone visit is to provide behavioral health care while limiting exposure to the coronavirus (COVID19).  There is a possibility of technology failure and discussed alternative modes of communication if that failure occurs."  "By engaging in this telephone visit, you consent to the provision of healthcare.  Additionally, you authorize for your insurance to be billed for the services provided during this telephone visit."   Patient and/or legal guardian consented to telephone visit: Yes   PRESENTING CONCERNS: Patient and/or family reports the following symptoms/concerns: Patient reports slight decrease in symptoms since communicating feelings with adult daughter Duration of problem: Ongoing; Severity of problem: moderate  STRENGTHS (Protective Factors/Coping Skills): Patient received strong support from family and friends Patient has desire to change Has good insight  GOALS ADDRESSED: Patient will: 1.  Reduce symptoms of: anxiety and depression  2.  Increase knowledge and/or ability of: self-management skills  3.  Demonstrate ability to: Increase healthy adjustment to current life circumstances and Increase adequate support systems for  patient/family  INTERVENTIONS: Interventions utilized:  Brief CBT Standardized Assessments completed: Not Needed  ASSESSMENT: Patient currently experiencing anxiety and depression triggered by psychosocial stressors.   Patient may benefit from continued therapy and utilization of healthy coping skills discussed.  PLAN: 1. Follow up with behavioral health clinician on : Contact LCSW to schedule follow-up appointment 2. Behavioral recommendations: 3. Strategies discussed 4. Referral(s): Integrated [employer] (In Clinic)  Bridgett Larsson

## 2020-06-08 ENCOUNTER — Ambulatory Visit (INDEPENDENT_AMBULATORY_CARE_PROVIDER_SITE_OTHER): Payer: Self-pay | Admitting: Licensed Clinical Social Worker

## 2020-06-08 ENCOUNTER — Other Ambulatory Visit: Payer: Self-pay

## 2020-06-08 DIAGNOSIS — F411 Generalized anxiety disorder: Secondary | ICD-10-CM

## 2020-06-08 NOTE — BH Specialist Note (Signed)
Integrated Behavioral Health Follow Up Visit  MRN: [id number] Name: Sarah Myers  Number of Integrated Behavioral Health Clinician visits: 2/6 Session Start time: 3:05 PM  Session End time: 4:00 PM Total time: 55   Type of Service: Integrated Behavioral Health- Individual Interpretor:No. Interpretor Name and Language: NA  SUBJECTIVE: Sarah Myers is a 49 y.o. female accompanied by self Patient was referred by Dr. Earlene Plater for depression and anxiety. Patient reports the following symptoms/concerns: Pt reports difficulty managing depression and anxiety symptoms Duration of problem: Ongoing; Severity of problem: moderate  OBJECTIVE: Mood: Appropriate and Affect: Appropriate Risk of harm to self or others: No plan to harm self or others  LIFE CONTEXT: Family and Social: Pt receives support from family School/Work: Pt is uninsured Self-Care: Pt is participating in brief therapy Life Changes: Pt reports strain with adult children, assists with the care of uncle, and is coping with the loss of spouse (05/2014)  GOALS ADDRESSED: Patient will: 1.  Reduce symptoms of: stress  2.  Increase knowledge and/or ability of: stress reduction  3.  Demonstrate ability to: Increase healthy adjustment to current life circumstances, Increase adequate support systems for patient/family and Begin healthy grieving over loss  INTERVENTIONS: Interventions utilized:  Brief CBT and Supportive Counseling Standardized Assessments completed: Not Needed  ASSESSMENT: Patient currently experiencing difficulty managing depression and anxiety symptoms triggered by grief and stress.   Patient may benefit from ongoing participation in brief therapy. Aspects of healthy relationships were identified and strategies to improve were discussed.   PLAN: 1. Follow up with behavioral health clinician on : LCSW will contact patient to schedule follow up appointment 2. Behavioral recommendations: Utilize  strategies discussed 3. Referral(s): Integrated Behavioral Health Services (In Clinic) 4. "From scale of 1-10, how likely are you to follow plan?":   Bridgett Larsson, LCSW 06/08/2020 5:17 PM

## 2020-06-22 ENCOUNTER — Other Ambulatory Visit (HOSPITAL_COMMUNITY): Payer: Medicaid Other

## 2020-06-23 ENCOUNTER — Ambulatory Visit (HOSPITAL_BASED_OUTPATIENT_CLINIC_OR_DEPARTMENT_OTHER): Payer: Medicaid Other | Attending: Internal Medicine | Admitting: Internal Medicine

## 2020-06-28 ENCOUNTER — Telehealth: Payer: Self-pay

## 2020-06-28 NOTE — Telephone Encounter (Signed)

## 2020-06-28 NOTE — Patient Instructions (Signed)
Thank you for choosing Primary Care at [facility] to be your medical home!    Sarah Myers was seen by De Hollingshead, DO today.   Rance Muir Kainz's primary care provider is Marcy Siren, DO.   For the best care possible, you should try to see Marcy Siren, DO whenever you come to the clinic.   We look forward to seeing you again soon!  If you have any questions about your visit today, please call us at [phone] or feel free to reach your primary care provider via MyChart.   Keeping You Healthy  Get These Tests 1. Blood Pressure- Have your blood pressure checked once a year by your health care provider.  Normal blood pressure is 120/80. 2. Weight- Have your body mass index (BMI) calculated to screen for obesity.  BMI is measure of body fat based on height and weight.  You can also calculate your own BMI at https://www.west-esparza.com/. 3. Cholesterol- Have your cholesterol checked every 5 years starting at age 79 then yearly starting at age 56. 4. Chlamydia, HIV, and other sexually transmitted diseases- Get screened every year until age 33, then within three months of each new sexual provider. 5. Pap Test - Every 1-5 years; discuss with your health care provider. 6. Mammogram- Every 1-2 years starting at age 45--50  Take these medicines  Calcium with Vitamin D-Your body needs 1200 mg of Calcium each day and [phone] IU of Vitamin D daily.  Your body can only absorb 500 mg of Calcium at a time so Calcium must be taken in 2 or 3 divided doses throughout the day.  Multivitamin with folic acid- Once daily if it is possible for you to become pregnant.  Get these Immunizations  Gardasil-Series of three doses; prevents HPV related illness such as genital warts and cervical cancer.  Menactra-Single dose; prevents meningitis.  Tetanus shot- Every 10 years.  Flu shot-Every year.  Take these steps 1. Do not smoke-Your healthcare provider can help you quit.   For tips on how to quit go to www.smokefree.gov or call 1-800 QUITNOW. 2. Be physically active- Exercise 5 days a week for at least 30 minutes.  If you are not already physically active, start slow and gradually work up to 30 minutes of moderate physical activity.  Examples of moderate activity include walking briskly, dancing, swimming, bicycling, etc. 3. Breast Cancer- A self breast exam every month is important for early detection of breast cancer.  For more information and instruction on self breast exams, ask your healthcare provider or [place].[employer]. 4. Eat a healthy diet- Eat a variety of healthy foods such as fruits, vegetables, whole grains, low fat milk, low fat cheeses, yogurt, lean meats, poultry and fish, beans, nuts, tofu, etc.  For more information go to www. Thenutritionsource.org 5. Drink alcohol in moderation- Limit alcohol intake to one drink or less per day. Never drink and drive. 6. Depression- Your emotional health is as important as your physical health.  If you're feeling down or losing interest in things you normally enjoy please talk to your healthcare provider about being screened for depression. 7. Dental visit- Brush and floss your teeth twice daily; visit your dentist twice a year. 8. Eye doctor- Get an eye exam at least every 2 years. 9. Helmet use- Always wear a helmet when riding a bicycle, motorcycle, rollerblading or skateboarding. 10. Safe sex- If you may be exposed to sexually transmitted infections, use a condom. 11. Seat belts- Seat belts can save  your live; always wear one. 12. Smoke/Carbon Monoxide detectors- These detectors need to be installed on the appropriate level of your home. Replace batteries at least once a year. 13. Skin cancer- When out in the sun please cover up and use sunscreen 15 SPF or higher. 14. Violence- If anyone is threatening or hurting you, please tell your healthcare provider.

## 2020-06-29 ENCOUNTER — Ambulatory Visit (INDEPENDENT_AMBULATORY_CARE_PROVIDER_SITE_OTHER): Payer: Self-pay | Admitting: Internal Medicine

## 2020-06-29 ENCOUNTER — Encounter: Payer: Self-pay | Admitting: Internal Medicine

## 2020-06-29 ENCOUNTER — Other Ambulatory Visit: Payer: Self-pay

## 2020-06-29 VITALS — BP 201/118 | HR 67 | Temp 97.3°F | Resp 17 | Ht 64.0 in | Wt 157.0 lb

## 2020-06-29 DIAGNOSIS — Z1159 Encounter for screening for other viral diseases: Secondary | ICD-10-CM

## 2020-06-29 DIAGNOSIS — R430 Anosmia: Secondary | ICD-10-CM

## 2020-06-29 DIAGNOSIS — F411 Generalized anxiety disorder: Secondary | ICD-10-CM

## 2020-06-29 DIAGNOSIS — Z0000 Encounter for general adult medical examination without abnormal findings: Secondary | ICD-10-CM

## 2020-06-29 DIAGNOSIS — N951 Menopausal and female climacteric states: Secondary | ICD-10-CM

## 2020-06-29 DIAGNOSIS — F32A Depression, unspecified: Secondary | ICD-10-CM

## 2020-06-29 DIAGNOSIS — I10 Essential (primary) hypertension: Secondary | ICD-10-CM

## 2020-06-29 DIAGNOSIS — F329 Major depressive disorder, single episode, unspecified: Secondary | ICD-10-CM

## 2020-06-29 DIAGNOSIS — R0683 Snoring: Secondary | ICD-10-CM

## 2020-06-29 MED ORDER — HYDROXYZINE HCL 10 MG PO TABS: 10.0000 mg | ORAL_TABLET | Freq: Three times a day (TID) | ORAL | 0 refills | Status: DC | PRN

## 2020-06-29 MED ORDER — HYDROCHLOROTHIAZIDE 12.5 MG PO CAPS: 12.5000 mg | ORAL_CAPSULE | Freq: Every day | ORAL | 1 refills | Status: DC

## 2020-06-29 NOTE — Progress Notes (Signed)
Subjective:    Sarah Myers - 49 y.o. female MRN [id number]  Date of birth: May 03, 1971  HPI  Sarah Myers is here for annual exam. Declines STD screening. Has a variety of concerns today.   Worried about her mood. Has a lot of stressors at home. She takes care of her uncle who is on peritoneal dialysis. Two of her children ask her to do a lot of things for her. She watches her grandchild more days than not. She is followed by Jenel Lucks, LCSW. This seems to help.   Has concerns that her menstrual pattern has changed. Has started having cycles that are lasting longer between periods and has had periods of amenorrhea. This has been occurring for >1 year now.   Reports a lot of daytime fatigue and snoring. Was supposed to have a sleep study done but this didn't happen due to changes in insurance.    GAD 7 : Generalized Anxiety Score 06/29/2020 05/18/2020 10/17/2016 09/26/2016  Nervous, Anxious, on Edge 0 2 1 2   Control/stop worrying 3 2 (No Data) 2  Worry too much - different things 3 1 3 2   Trouble relaxing 2 1 0 0  Restless 0 0 0 0  Easily annoyed or irritable 0 1 3 2   Afraid - awful might happen 0 0 3 1  Total GAD 7 Score 8 7 - 9   Depression screen [facility] 2/9 06/29/2020 05/18/2020 10/17/2016  Decreased Interest 2 0 2  Down, Depressed, Hopeless 3 0 3  PHQ - 2 Score 5 0 5  Altered sleeping 3 - 1  Tired, decreased energy 2 - 2  Change in appetite 2 - 1  Feeling bad or failure about yourself  0 - 1  Trouble concentrating 0 - 2  Moving slowly or fidgety/restless 0 - 0  Suicidal thoughts 0 - 0  PHQ-9 Score 12 - 12       Health Maintenance:  Health Maintenance Due  Topic Date Due  . COVID-19 Vaccine (1) Never done    -  reports that she has never smoked. She has never used smokeless tobacco. - Review of Systems: Per HPI. - Past Medical History: Patient Active Problem List   Diagnosis Date Noted  . Iron deficiency anemia 06/30/2020  . Vitamin D deficiency  06/30/2020  . GAD (generalized anxiety disorder) 05/18/2020  . Essential hypertension 10/30/2019   - Medications: reviewed and updated   Objective:   Physical Exam BP (!) 201/118   Pulse 67   Temp (!) 97.3 F (36.3 C) (Temporal)   Resp 17   Ht 5\' 4"  (1.626 m)   Wt 157 lb (71.2 kg)   SpO2 96%   BMI 26.95 kg/m  Physical Exam Constitutional:      Appearance: She is not diaphoretic.  HENT:     Head: Normocephalic and atraumatic.  Eyes:     Conjunctiva/sclera: Conjunctivae normal.     Pupils: Pupils are equal, round, and reactive to light.  Neck:     Thyroid: No thyromegaly.  Cardiovascular:     Rate and Rhythm: Normal rate and regular rhythm.     Heart sounds: Normal heart sounds. No murmur heard.   Pulmonary:     Effort: Pulmonary effort is normal. No respiratory distress.     Breath sounds: Normal breath sounds. No wheezing.  Abdominal:     General: Bowel sounds are normal. There is no distension.     Palpations: Abdomen is soft.  Tenderness: There is no abdominal tenderness. There is no guarding or rebound.  Musculoskeletal:        General: No deformity. Normal range of motion.     Cervical back: Normal range of motion and neck supple.  Lymphadenopathy:     Cervical: No cervical adenopathy.  Skin:    General: Skin is warm and dry.     Findings: No rash.  Neurological:     Mental Status: She is alert and oriented to person, place, and time.     Gait: Gait is intact.  Psychiatric:        Mood and Affect: Mood and affect normal.        Judgment: Judgment normal.            Assessment & Plan:   1. Annual physical exam Counseled on 150 minutes of exercise per week, healthy eating (including decreased daily intake of saturated fats, cholesterol, added sugars, sodium), STI prevention, routine healthcare maintenance. - CBC with Differential - Comprehensive metabolic panel  2. Need for hepatitis C screening test - Hepatitis C Antibody  3. Essential  hypertension BP is significantly elevated. Patient asymptomatic. Hesitant to increase Coreg due to pulse already in the 60s. Will start HCTZ.  - hydrochlorothiazide (MICROZIDE) 12.5 MG capsule; Take 1 capsule (12.5 mg total) by mouth daily.  Dispense: 30 capsule; Refill: 1 - Basic metabolic panel; Future  4. GAD (generalized anxiety disorder) 5. Depression, unspecified depression type Follow up with LCSW. Continue Hydroxyzine as needed. Check other labs that could be contributing to fatigue and low mood. Not interested in daily medication to treat anxiety/depression at present. No safety concerns.  - Vitamin B12 - VITAMIN D 25 Hydroxy (Vit-D Deficiency, Fractures) - hydrOXYzine (ATARAX/VISTARIL) 10 MG tablet; Take 1 tablet (10 mg total) by mouth 3 (three) times daily as needed.  Dispense: 30 tablet; Refill: 0   6. Snoring - Split night study; Future  7. 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 Discussed scent retraining. ENT evaluation.  - Ambulatory referral to ENT  8. Perimenopause Discussed that menstrual pattern sounds consistent with perimenopausal status and that her age also fits this. Discussed that can still conceive during this time period and to use appropriate contraception if sexually active.      Marcy Siren, D.O. 07/09/2020, 2:18 PM Primary Care at [facility]

## 2020-06-30 ENCOUNTER — Encounter: Payer: Medicaid Other | Admitting: Internal Medicine

## 2020-06-30 DIAGNOSIS — D509 Iron deficiency anemia, unspecified: Secondary | ICD-10-CM | POA: Insufficient documentation

## 2020-06-30 DIAGNOSIS — E559 Vitamin D deficiency, unspecified: Secondary | ICD-10-CM | POA: Insufficient documentation

## 2020-06-30 LAB — COMPREHENSIVE METABOLIC PANEL
ALT: 21 IU/L (ref 0–32)
AST: 18 IU/L (ref 0–40)
Albumin/Globulin Ratio: 1.3 (ref 1.2–2.2)
Albumin: 4.6 g/dL (ref 3.8–4.8)
Alkaline Phosphatase: 90 IU/L (ref 48–121)
BUN/Creatinine Ratio: 12 (ref 9–23)
BUN: 10 mg/dL (ref 6–24)
Bilirubin Total: 0.3 mg/dL (ref 0.0–1.2)
CO2: 21 mmol/L (ref 20–29)
Calcium: 10.3 mg/dL — ABNORMAL HIGH (ref 8.7–10.2)
Chloride: 105 mmol/L (ref 96–106)
Creatinine, Ser: 0.84 mg/dL (ref 0.57–1.00)
GFR calc Af Amer: 96 mL/min/{1.73_m2} (ref >59)
GFR calc non Af Amer: 83 mL/min/{1.73_m2} (ref >59)
Globulin, Total: 3.5 g/dL (ref 1.5–4.5)
Glucose: 107 mg/dL — ABNORMAL HIGH (ref 65–99)
Potassium: 3.6 mmol/L (ref 3.5–5.2)
Sodium: 141 mmol/L (ref 134–144)
Total Protein: 8.1 g/dL (ref 6.0–8.5)

## 2020-06-30 LAB — CBC WITH DIFFERENTIAL/PLATELET
Basophils Absolute: 0.1 10*3/uL (ref 0.0–0.2)
Basos: 1 %
EOS (ABSOLUTE): 0.2 10*3/uL (ref 0.0–0.4)
Eos: 3 %
Hematocrit: 34.1 % (ref 34.0–46.6)
Hemoglobin: 9.6 g/dL — ABNORMAL LOW (ref 11.1–15.9)
Immature Grans (Abs): 0 10*3/uL (ref 0.0–0.1)
Immature Granulocytes: 0 %
Lymphocytes Absolute: 2 10*3/uL (ref 0.7–3.1)
Lymphs: 31 %
MCH: 18.8 pg — ABNORMAL LOW (ref 26.6–33.0)
MCHC: 28.2 g/dL — ABNORMAL LOW (ref 31.5–35.7)
MCV: 67 fL — ABNORMAL LOW (ref 79–97)
Monocytes Absolute: 0.3 10*3/uL (ref 0.1–0.9)
Monocytes: 4 %
Neutrophils Absolute: 4 10*3/uL (ref 1.4–7.0)
Neutrophils: 61 %
Platelets: 258 10*3/uL (ref 150–450)
RBC: 5.1 x10E6/uL (ref 3.77–5.28)
RDW: 19.7 % — ABNORMAL HIGH (ref 11.7–15.4)
WBC: 6.5 10*3/uL (ref 3.4–10.8)

## 2020-06-30 LAB — VITAMIN B12: Vitamin B-12: 231 pg/mL — ABNORMAL LOW (ref 232–1245)

## 2020-06-30 LAB — HEPATITIS C ANTIBODY: Hep C Virus Ab: 0.1 s/co ratio (ref 0.0–0.9)

## 2020-06-30 LAB — VITAMIN D 25 HYDROXY (VIT D DEFICIENCY, FRACTURES): Vit D, 25-Hydroxy: 16.5 ng/mL — ABNORMAL LOW (ref 30.0–100.0)

## 2020-06-30 MED ORDER — VITAMIN D (ERGOCALCIFEROL) 1.25 MG (50000 UNIT) PO CAPS
50000.0000 [IU] | ORAL_CAPSULE | ORAL | 0 refills | Status: DC
Start: 2020-06-30 — End: 2022-02-11

## 2020-06-30 MED ORDER — FERROUS SULFATE 324 (65 FE) MG PO TBEC
1.0000 | DELAYED_RELEASE_TABLET | ORAL | 0 refills | Status: DC
Start: 2020-06-30 — End: 2022-02-11

## 2020-07-02 ENCOUNTER — Encounter: Payer: Self-pay | Admitting: Internal Medicine

## 2020-07-06 ENCOUNTER — Ambulatory Visit: Payer: Medicaid Other | Admitting: Licensed Clinical Social Worker

## 2020-07-13 ENCOUNTER — Other Ambulatory Visit: Payer: Self-pay | Admitting: Internal Medicine

## 2020-07-13 ENCOUNTER — Ambulatory Visit: Payer: Self-pay

## 2020-07-13 ENCOUNTER — Other Ambulatory Visit: Payer: Self-pay

## 2020-07-13 DIAGNOSIS — I10 Essential (primary) hypertension: Secondary | ICD-10-CM

## 2020-07-13 MED ORDER — HYDROCHLOROTHIAZIDE 25 MG PO TABS
25.0000 mg | ORAL_TABLET | Freq: Every day | ORAL | 3 refills | Status: DC
Start: 2020-07-13 — End: 2021-09-19

## 2020-07-13 MED FILL — HYDROCHLOROTHIAZIDE 25 MG T: 25 | 30 days supply | Qty: 30 | Fill #0

## 2020-07-13 NOTE — Progress Notes (Signed)
Patient here for BP check & repeat BMP. Has taken BP medications today. After sitting BP was 188/*97. Pulse was 70. Spoke with provider who states that she will increase HCTZ to 25 mg at night. Follow up in 4-5 weeks. KWalker, CMA.

## 2020-07-14 ENCOUNTER — Ambulatory Visit: Payer: Self-pay | Attending: Internal Medicine | Admitting: Licensed Clinical Social Worker

## 2020-07-14 DIAGNOSIS — F419 Anxiety disorder, unspecified: Secondary | ICD-10-CM

## 2020-07-14 LAB — BASIC METABOLIC PANEL
BUN/Creatinine Ratio: 14 (ref 9–23)
BUN: 12 mg/dL (ref 6–24)
CO2: 23 mmol/L (ref 20–29)
Calcium: 10.5 mg/dL — ABNORMAL HIGH (ref 8.7–10.2)
Chloride: 101 mmol/L (ref 96–106)
Creatinine, Ser: 0.84 mg/dL (ref 0.57–1.00)
GFR calc Af Amer: 96 mL/min/{1.73_m2} (ref >59)
GFR calc non Af Amer: 83 mL/min/{1.73_m2} (ref >59)
Glucose: 108 mg/dL — ABNORMAL HIGH (ref 65–99)
Potassium: 3.5 mmol/L (ref 3.5–5.2)
Sodium: 143 mmol/L (ref 134–144)

## 2020-07-16 NOTE — Progress Notes (Signed)
Patient notified of results & recommendations. Expressed understanding.

## 2020-07-22 NOTE — BH Specialist Note (Signed)
Integrated Behavioral Health Visit via Telemedicine (Telephone)  07/14/2020 Sarah Myers [id number]   Session Start time: 3:10 PM  Session End time: 3:45 PM Total time: 35   Referring Provider: Dr. Earlene Plater Type of Visit: Telephonic Patient location: Home [facility] Provider location: Office All persons participating in visit: LCSW and patient   Discussed confidentiality: Yes   "By engaging in this telephone visit, you consent to the provision of healthcare.  Additionally, you authorize for your insurance to be billed for the services provided during this telephone visit."   Patient and/or legal guardian consented to telephone visit: Yes   PRESENTING CONCERNS: Patient and/or family reports the following symptoms/concerns: Patient reports decrease and depression anxiety symptoms.  Patient shared stress and disappointment due to the recent loss of employment Duration of problem: Ongoing; Severity of problem: moderate  STRENGTHS (Protective Factors/Coping Skills): Has good insight Patient is participating in medication management  GOALS ADDRESSED: Patient will: 1.  Reduce symptoms of: anxiety, depression and stress  2.  Increase knowledge and/or ability of: self-management skills  3.  Demonstrate ability to: Increase healthy adjustment to current life circumstances, Increase adequate support systems for patient/family and Begin healthy grieving over loss  INTERVENTIONS: Interventions utilized:  Brief CBT and Supportive Counseling Standardized Assessments completed: Not Needed  ASSESSMENT: Patient currently experiencing feelings of sadness over recent loss of employment.  Patient reports slight decrease in anxiety and depression symptoms.   Patient may benefit from ongoing therapy and medication management.  Encouragement and support provided.  LCSW discussed strategies to assist in the management and/or decrease of reported symptoms.  PLAN: 1. Follow up with behavioral  health clinician on : 08/04/2020 2. Behavioral recommendations: Utilize strategies discussed and continued medication compliance 3. Referral(s): Integrated [employer] (In Clinic)  Sarah Myers   Confirmed patient's address: Yes  Confirmed patient's phone number: Yes  Any changes to demographics: No   Confirmed patient's insurance: Yes  Any changes to patient's insurance: No    The following statements were read to the patient and/or legal guardian that are established with the [facility] Provider.  "The purpose of this phone visit is to provide behavioral health care while limiting exposure to the coronavirus (COVID19).  There is a possibility of technology failure and discussed alternative modes of communication if that failure occurs."

## 2020-08-04 ENCOUNTER — Telehealth (INDEPENDENT_AMBULATORY_CARE_PROVIDER_SITE_OTHER): Payer: Self-pay | Admitting: Licensed Clinical Social Worker

## 2020-08-04 ENCOUNTER — Other Ambulatory Visit: Payer: Self-pay

## 2020-08-04 ENCOUNTER — Ambulatory Visit: Payer: Self-pay | Attending: Internal Medicine | Admitting: Licensed Clinical Social Worker

## 2020-08-04 NOTE — Telephone Encounter (Signed)
Call placed to patient regarding IBH appointment. LCSW left message requesting a return call.  °

## 2020-08-18 ENCOUNTER — Other Ambulatory Visit: Payer: Self-pay

## 2020-08-18 ENCOUNTER — Ambulatory Visit (INDEPENDENT_AMBULATORY_CARE_PROVIDER_SITE_OTHER): Payer: Self-pay | Admitting: Internal Medicine

## 2020-08-18 ENCOUNTER — Encounter: Payer: Self-pay | Admitting: Internal Medicine

## 2020-08-18 VITALS — BP 237/127 | HR 72 | Temp 97.3°F | Resp 17 | Wt 157.0 lb

## 2020-08-18 DIAGNOSIS — I10 Essential (primary) hypertension: Secondary | ICD-10-CM

## 2020-08-18 LAB — BASIC METABOLIC PANEL
BUN/Creatinine Ratio: 17 (ref 9–23)
BUN: 12 mg/dL (ref 6–24)
CO2: 25 mmol/L (ref 20–29)
Calcium: 11.1 mg/dL — ABNORMAL HIGH (ref 8.7–10.2)
Chloride: 106 mmol/L (ref 96–106)
Creatinine, Ser: 0.7 mg/dL (ref 0.57–1.00)
GFR calc Af Amer: 119 mL/min/{1.73_m2} (ref >59)
GFR calc non Af Amer: 104 mL/min/{1.73_m2} (ref >59)
Glucose: 103 mg/dL — ABNORMAL HIGH (ref 65–99)
Potassium: 3.5 mmol/L (ref 3.5–5.2)
Sodium: 140 mmol/L (ref 134–144)

## 2020-08-18 MED ORDER — CLONIDINE HCL 0.1 MG PO TABS
0.3000 mg | ORAL_TABLET | Freq: Once | ORAL | Status: AC
Start: 2020-08-18 — End: 2020-08-18
  Administered 2020-08-18: 0.3 mg via ORAL

## 2020-08-18 NOTE — Progress Notes (Signed)
  Subjective:    Sarah Myers - 49 y.o. female MRN [id number]  Date of birth: 09-17-1971  HPI  Sarah Myers is here for follow up on HTN.  Chronic HTN Disease Monitoring:  Home BP Monitoring - Does not monitor regularly at home.  Chest pain- no  Dyspnea- no Headache - no vision changes-no  Medications: Coreg 12.5 mg BID, HCTZ 25 mg (increased from 12.5 mg on 7/27)  Compliance- no, reports never increased HCTZ to 25 mg, still taking 12.5 mg  Lightheadedness- no  Edema- no    Health Maintenance:  Health Maintenance Due  Topic Date Due  . COVID-19 Vaccine (1) Never done  . INFLUENZA VACCINE  07/18/2020    -  reports that she has never smoked. She has never used smokeless tobacco. - Review of Systems: Per HPI. - Past Medical History: Patient Active Problem List   Diagnosis Date Noted  . Iron deficiency anemia 06/30/2020  . Vitamin D deficiency 06/30/2020  . GAD (generalized anxiety disorder) 05/18/2020  . Essential hypertension 10/30/2019   - Medications: reviewed and updated   Objective:   Physical Exam BP (!) 230/119   Pulse 72   Temp (!) 97.3 F (36.3 C) (Temporal)   Resp 17   Wt 157 lb (71.2 kg)   SpO2 96%   BMI 26.95 kg/m  Physical Exam Constitutional:      General: She is not in acute distress.    Appearance: She is not diaphoretic.  HENT:     Head: Normocephalic and atraumatic.  Eyes:     Conjunctiva/sclera: Conjunctivae normal.  Cardiovascular:     Rate and Rhythm: Normal rate and regular rhythm.     Heart sounds: Normal heart sounds. No murmur heard.   Pulmonary:     Effort: Pulmonary effort is normal. No respiratory distress.     Breath sounds: Normal breath sounds.  Musculoskeletal:        General: Normal range of motion.  Skin:    General: Skin is warm and dry.  Neurological:     Mental Status: She is alert and oriented to person, place, and time.  Psychiatric:        Mood and Affect: Affect normal.         Judgment: Judgment normal.            Assessment & Plan:    1. Essential hypertension Although patient is well appearing and asymptomatic, BP remains extremely elevated despite Clonodine and monitoring in office. Had initially planned for stat BMET and close follow up in office if BP improved with instructions to increase HCTZ 25 mg as directed at last office visit. However, BP actually worsened. Will send patient to ED for management/monitoring of BP and lab work.  - Basic Metabolic Panel - cloNIDine (CATAPRES) tablet 0.3 mg    Marcy Siren, D.O. 08/18/2020, 10:03 AM Primary Care at [facility]

## 2020-08-20 NOTE — Progress Notes (Signed)
Patient notified of results & recommendations. Expressed understanding. Declines going to ED.

## 2020-08-25 ENCOUNTER — Ambulatory Visit (HOSPITAL_BASED_OUTPATIENT_CLINIC_OR_DEPARTMENT_OTHER): Payer: Medicaid Other | Attending: Internal Medicine | Admitting: Internal Medicine

## 2020-08-31 ENCOUNTER — Ambulatory Visit: Payer: Medicaid Other

## 2021-09-19 ENCOUNTER — Encounter: Payer: Self-pay | Admitting: Family Medicine

## 2021-09-19 ENCOUNTER — Ambulatory Visit (INDEPENDENT_AMBULATORY_CARE_PROVIDER_SITE_OTHER): Payer: 59 | Admitting: Family Medicine

## 2021-09-19 ENCOUNTER — Other Ambulatory Visit: Payer: Self-pay

## 2021-09-19 VITALS — BP 227/125 | HR 72 | Temp 98.3°F | Resp 16 | Ht 64.0 in | Wt 146.0 lb

## 2021-09-19 DIAGNOSIS — I10 Essential (primary) hypertension: Secondary | ICD-10-CM

## 2021-09-19 MED ORDER — CARVEDILOL 25 MG PO TABS: 25.0000 mg | ORAL_TABLET | Freq: Two times a day (BID) | ORAL | 1 refills | Status: DC

## 2021-09-19 MED ORDER — HYDROCHLOROTHIAZIDE 25 MG PO TABS: 25.0000 mg | ORAL_TABLET | Freq: Every day | ORAL | 1 refills | Status: DC

## 2021-09-19 NOTE — Progress Notes (Signed)
Established Patient Office Visit  Subjective:  Patient ID: Sarah Myers, female    DOB: June 04, 1971  Age: 50 y.o. MRN: [id number]  CC:  Chief Complaint  Patient presents with   Annual Exam    Sarah Myers presents for for follow-up of hypertension.  Patient reports that she has not had care for about a year secondary to insurance/financial issues.  She has also not been taking her medications as had been recommended.  While she knows her blood pressure is elevated she does not report any additional acute complaints.  Past Medical History:  Diagnosis Date   Hypertension        Social History   Socioeconomic History   Marital status: Married    Spouse name: Not on file   Number of children: Not on file   Years of education: Not on file   Highest education level: Not on file  Occupational History   Not on file  Tobacco Use   Smoking status: Never   Smokeless tobacco: Never  Vaping Use   Vaping Use: Never used  Substance and Sexual Activity   Alcohol use: Not Currently    Alcohol/week: 0.0 standard drinks   Drug use: No   Sexual activity: Never  Other Topics Concern   Not on file  Social History Narrative   Not on file   Social Determinants of Health   Financial Resource Strain: Not on file  Food Insecurity: Not on file  Transportation Needs: Not on file  Physical Activity: Not on file  Stress: Not on file  Social Connections: Not on file  Intimate Partner Violence: Not on file    ROS Review of Systems  All other systems reviewed and are negative.  Objective:   Today's Vitals: BP (!) 227/125 (BP Location: Right Arm, Patient Position: Sitting, Cuff Size: Large)   Pulse 72   Temp 98.3 F (36.8 C) (Oral)   Resp 16   Ht 5\' 4"  (1.626 m)   Wt 146 lb (66.2 kg)   SpO2 98%   BMI 25.06 kg/m   Physical Exam Vitals and nursing note reviewed.  Constitutional:      General: She is not in acute distress. Cardiovascular:     Rate and  Rhythm: Normal rate and regular rhythm.  Pulmonary:     Effort: Pulmonary effort is normal.     Breath sounds: Normal breath sounds.  Abdominal:     Palpations: Abdomen is soft.     Tenderness: There is no abdominal tenderness.  Musculoskeletal:     Right lower leg: No edema.     Left lower leg: No edema.  Neurological:     General: No focal deficit present.     Mental Status: She is alert and oriented to person, place, and time.    Assessment & Plan:  1. Uncontrolled hypertension Discussed compliance.Hydrochlorothiazide was refilled and carvedilol was increased to 25 mg p.o. twice daily from 12.5 mg p.o. twice daily.  Patient deferred ED follow-up secondary to extremely elevated blood pressure.  Will monitor closely.  Patient to report to ED/office visit if symptoms persist or worsen.  Outpatient Encounter Medications as of 09/19/2021  Medication Sig   carvedilol (COREG) 25 MG tablet Take 1 tablet (25 mg total) by mouth 2 (two) times daily with a meal.   APPLE CIDER VINEGAR PO Take 1 capsule by mouth daily. (Patient not taking: Reported on 09/19/2021)   ferrous sulfate 324 (65 Fe) MG TBEC Take 1 tablet (  325 mg total) by mouth every other day. (Patient not taking: Reported on 09/19/2021)   hydrochlorothiazide (HYDRODIURIL) 25 MG tablet Take 1 tablet (25 mg total) by mouth daily.   hydrOXYzine (ATARAX/VISTARIL) 10 MG tablet Take 1 tablet (10 mg total) by mouth 3 (three) times daily as needed. (Patient not taking: Reported on 09/19/2021)   Vitamin D, Ergocalciferol, (DRISDOL) 1.25 MG (50000 UNIT) CAPS capsule Take 1 capsule (50,000 Units total) by mouth every 7 (seven) days. (Patient not taking: Reported on 09/19/2021)   [DISCONTINUED] carvedilol (COREG) 12.5 MG tablet Take 1 tablet (12.5 mg total) by mouth 2 (two) times daily. (Patient not taking: Reported on 09/19/2021)   [DISCONTINUED] hydrochlorothiazide (HYDRODIURIL) 25 MG tablet Take 1 tablet (25 mg total) by mouth daily. (Patient not  taking: Reported on 09/19/2021)   No facility-administered encounter medications on file as of 09/19/2021.    Follow-up: Return in about 1 week (around 09/26/2021) for follow up.   Tommie Raymond, MD

## 2021-09-19 NOTE — Progress Notes (Signed)
Patient is here for CPE. Patient concern about not smelling and blood pressure issues.

## 2021-09-26 ENCOUNTER — Ambulatory Visit: Payer: 59 | Admitting: Family Medicine

## 2021-10-10 ENCOUNTER — Ambulatory Visit: Payer: 59 | Admitting: Family Medicine

## 2021-11-14 ENCOUNTER — Other Ambulatory Visit: Payer: Self-pay | Admitting: Family Medicine

## 2021-12-14 ENCOUNTER — Other Ambulatory Visit: Payer: Self-pay | Admitting: Family Medicine

## 2022-01-13 ENCOUNTER — Other Ambulatory Visit: Payer: Self-pay | Admitting: Family Medicine

## 2022-02-05 ENCOUNTER — Emergency Department (HOSPITAL_COMMUNITY): Payer: 59

## 2022-02-05 ENCOUNTER — Inpatient Hospital Stay (HOSPITAL_COMMUNITY)
Admission: EM | Admit: 2022-02-05 | Discharge: 2022-03-21 | DRG: 004 | Disposition: A | Discharge status: Other Acute Inpatient Hospital | Payer: 59 | Attending: Internal Medicine | Admitting: Internal Medicine

## 2022-02-05 ENCOUNTER — Other Ambulatory Visit: Payer: Self-pay

## 2022-02-05 ENCOUNTER — Inpatient Hospital Stay (HOSPITAL_COMMUNITY): Payer: 59

## 2022-02-05 ENCOUNTER — Encounter (HOSPITAL_COMMUNITY): Payer: Self-pay | Admitting: Neurology

## 2022-02-05 DIAGNOSIS — R042 Hemoptysis: Secondary | ICD-10-CM | POA: Diagnosis not present

## 2022-02-05 DIAGNOSIS — Z9851 Tubal ligation status: Secondary | ICD-10-CM

## 2022-02-05 DIAGNOSIS — Z6828 Body mass index (BMI) 28.0-28.9, adult: Secondary | ICD-10-CM

## 2022-02-05 DIAGNOSIS — F05 Delirium due to known physiological condition: Secondary | ICD-10-CM | POA: Diagnosis not present

## 2022-02-05 DIAGNOSIS — R29712 NIHSS score 12: Secondary | ICD-10-CM | POA: Diagnosis not present

## 2022-02-05 DIAGNOSIS — J9600 Acute respiratory failure, unspecified whether with hypoxia or hypercapnia: Secondary | ICD-10-CM

## 2022-02-05 DIAGNOSIS — I69391 Dysphagia following cerebral infarction: Secondary | ICD-10-CM

## 2022-02-05 DIAGNOSIS — R739 Hyperglycemia, unspecified: Secondary | ICD-10-CM | POA: Diagnosis present

## 2022-02-05 DIAGNOSIS — G9349 Other encephalopathy: Secondary | ICD-10-CM | POA: Diagnosis present

## 2022-02-05 DIAGNOSIS — I6622 Occlusion and stenosis of left posterior cerebral artery: Secondary | ICD-10-CM | POA: Diagnosis present

## 2022-02-05 DIAGNOSIS — D509 Iron deficiency anemia, unspecified: Secondary | ICD-10-CM | POA: Diagnosis present

## 2022-02-05 DIAGNOSIS — E872 Acidosis, unspecified: Secondary | ICD-10-CM | POA: Diagnosis not present

## 2022-02-05 DIAGNOSIS — Z452 Encounter for adjustment and management of vascular access device: Secondary | ICD-10-CM

## 2022-02-05 DIAGNOSIS — L89893 Pressure ulcer of other site, stage 3: Secondary | ICD-10-CM | POA: Diagnosis not present

## 2022-02-05 DIAGNOSIS — B370 Candidal stomatitis: Secondary | ICD-10-CM | POA: Diagnosis not present

## 2022-02-05 DIAGNOSIS — I611 Nontraumatic intracerebral hemorrhage in hemisphere, cortical: Secondary | ICD-10-CM | POA: Diagnosis not present

## 2022-02-05 DIAGNOSIS — R29810 Facial weakness: Secondary | ICD-10-CM | POA: Diagnosis present

## 2022-02-05 DIAGNOSIS — J15211 Pneumonia due to Methicillin susceptible Staphylococcus aureus: Secondary | ICD-10-CM | POA: Diagnosis not present

## 2022-02-05 DIAGNOSIS — Z823 Family history of stroke: Secondary | ICD-10-CM

## 2022-02-05 DIAGNOSIS — I619 Nontraumatic intracerebral hemorrhage, unspecified: Secondary | ICD-10-CM | POA: Diagnosis present

## 2022-02-05 DIAGNOSIS — E870 Hyperosmolality and hypernatremia: Secondary | ICD-10-CM | POA: Diagnosis not present

## 2022-02-05 DIAGNOSIS — R339 Retention of urine, unspecified: Secondary | ICD-10-CM | POA: Diagnosis not present

## 2022-02-05 DIAGNOSIS — Z978 Presence of other specified devices: Secondary | ICD-10-CM

## 2022-02-05 DIAGNOSIS — E876 Hypokalemia: Secondary | ICD-10-CM | POA: Diagnosis present

## 2022-02-05 DIAGNOSIS — R34 Anuria and oliguria: Secondary | ICD-10-CM

## 2022-02-05 DIAGNOSIS — L89899 Pressure ulcer of other site, unspecified stage: Secondary | ICD-10-CM | POA: Diagnosis not present

## 2022-02-05 DIAGNOSIS — L8990 Pressure ulcer of unspecified site, unspecified stage: Secondary | ICD-10-CM | POA: Insufficient documentation

## 2022-02-05 DIAGNOSIS — N179 Acute kidney failure, unspecified: Secondary | ICD-10-CM | POA: Diagnosis not present

## 2022-02-05 DIAGNOSIS — I501 Left ventricular failure: Secondary | ICD-10-CM | POA: Diagnosis present

## 2022-02-05 DIAGNOSIS — G936 Cerebral edema: Secondary | ICD-10-CM | POA: Diagnosis present

## 2022-02-05 DIAGNOSIS — R4701 Aphasia: Secondary | ICD-10-CM | POA: Diagnosis present

## 2022-02-05 DIAGNOSIS — R140 Abdominal distension (gaseous): Secondary | ICD-10-CM | POA: Diagnosis not present

## 2022-02-05 DIAGNOSIS — Z7401 Bed confinement status: Secondary | ICD-10-CM

## 2022-02-05 DIAGNOSIS — R6881 Early satiety: Secondary | ICD-10-CM | POA: Diagnosis not present

## 2022-02-05 DIAGNOSIS — G935 Compression of brain: Secondary | ICD-10-CM | POA: Diagnosis present

## 2022-02-05 DIAGNOSIS — Z20822 Contact with and (suspected) exposure to covid-19: Secondary | ICD-10-CM | POA: Diagnosis present

## 2022-02-05 DIAGNOSIS — J9621 Acute and chronic respiratory failure with hypoxia: Secondary | ICD-10-CM | POA: Diagnosis not present

## 2022-02-05 DIAGNOSIS — I161 Hypertensive emergency: Secondary | ICD-10-CM | POA: Diagnosis present

## 2022-02-05 DIAGNOSIS — R1310 Dysphagia, unspecified: Secondary | ICD-10-CM | POA: Diagnosis present

## 2022-02-05 DIAGNOSIS — M7989 Other specified soft tissue disorders: Secondary | ICD-10-CM | POA: Diagnosis not present

## 2022-02-05 DIAGNOSIS — H5347 Heteronymous bilateral field defects: Secondary | ICD-10-CM | POA: Diagnosis present

## 2022-02-05 DIAGNOSIS — J9601 Acute respiratory failure with hypoxia: Secondary | ICD-10-CM

## 2022-02-05 DIAGNOSIS — Z888 Allergy status to other drugs, medicaments and biological substances status: Secondary | ICD-10-CM

## 2022-02-05 DIAGNOSIS — I610 Nontraumatic intracerebral hemorrhage in hemisphere, subcortical: Secondary | ICD-10-CM | POA: Diagnosis present

## 2022-02-05 DIAGNOSIS — Z430 Encounter for attention to tracheostomy: Secondary | ICD-10-CM

## 2022-02-05 DIAGNOSIS — G8104 Flaccid hemiplegia affecting left nondominant side: Secondary | ICD-10-CM | POA: Diagnosis present

## 2022-02-05 DIAGNOSIS — I2699 Other pulmonary embolism without acute cor pulmonale: Secondary | ICD-10-CM | POA: Diagnosis not present

## 2022-02-05 DIAGNOSIS — I110 Hypertensive heart disease with heart failure: Secondary | ICD-10-CM | POA: Diagnosis present

## 2022-02-05 DIAGNOSIS — Z4659 Encounter for fitting and adjustment of other gastrointestinal appliance and device: Secondary | ICD-10-CM

## 2022-02-05 DIAGNOSIS — Z930 Tracheostomy status: Secondary | ICD-10-CM

## 2022-02-05 DIAGNOSIS — E878 Other disorders of electrolyte and fluid balance, not elsewhere classified: Secondary | ICD-10-CM | POA: Diagnosis present

## 2022-02-05 DIAGNOSIS — R471 Dysarthria and anarthria: Secondary | ICD-10-CM | POA: Diagnosis present

## 2022-02-05 DIAGNOSIS — Z01818 Encounter for other preprocedural examination: Secondary | ICD-10-CM

## 2022-02-05 DIAGNOSIS — T17908A Unspecified foreign body in respiratory tract, part unspecified causing other injury, initial encounter: Secondary | ICD-10-CM

## 2022-02-05 DIAGNOSIS — Z7189 Other specified counseling: Secondary | ICD-10-CM

## 2022-02-05 DIAGNOSIS — E860 Dehydration: Secondary | ICD-10-CM | POA: Diagnosis present

## 2022-02-05 DIAGNOSIS — Z0189 Encounter for other specified special examinations: Secondary | ICD-10-CM

## 2022-02-05 DIAGNOSIS — I10 Essential (primary) hypertension: Secondary | ICD-10-CM

## 2022-02-05 DIAGNOSIS — Z8249 Family history of ischemic heart disease and other diseases of the circulatory system: Secondary | ICD-10-CM

## 2022-02-05 DIAGNOSIS — R4781 Slurred speech: Secondary | ICD-10-CM | POA: Diagnosis present

## 2022-02-05 DIAGNOSIS — Z79899 Other long term (current) drug therapy: Secondary | ICD-10-CM

## 2022-02-05 DIAGNOSIS — E440 Moderate protein-calorie malnutrition: Secondary | ICD-10-CM | POA: Diagnosis present

## 2022-02-05 DIAGNOSIS — J156 Pneumonia due to other aerobic Gram-negative bacteria: Secondary | ICD-10-CM | POA: Diagnosis not present

## 2022-02-05 DIAGNOSIS — R509 Fever, unspecified: Secondary | ICD-10-CM

## 2022-02-05 DIAGNOSIS — K5900 Constipation, unspecified: Secondary | ICD-10-CM | POA: Diagnosis not present

## 2022-02-05 DIAGNOSIS — R109 Unspecified abdominal pain: Secondary | ICD-10-CM

## 2022-02-05 DIAGNOSIS — I615 Nontraumatic intracerebral hemorrhage, intraventricular: Secondary | ICD-10-CM | POA: Diagnosis not present

## 2022-02-05 DIAGNOSIS — I248 Other forms of acute ischemic heart disease: Secondary | ICD-10-CM | POA: Diagnosis present

## 2022-02-05 LAB — RAPID URINE DRUG SCREEN, HOSP PERFORMED
Amphetamines: NOT DETECTED
Barbiturates: NOT DETECTED
Benzodiazepines: NOT DETECTED
Cocaine: NOT DETECTED
Opiates: NOT DETECTED
Tetrahydrocannabinol: NOT DETECTED

## 2022-02-05 LAB — CBC
HCT: 47.2 % — ABNORMAL HIGH (ref 36.0–46.0)
Hemoglobin: 16 g/dL — ABNORMAL HIGH (ref 12.0–15.0)
MCH: 28.5 pg (ref 26.0–34.0)
MCHC: 33.9 g/dL (ref 30.0–36.0)
MCV: 84.1 fL (ref 80.0–100.0)
Platelets: 247 10*3/uL (ref 150–400)
RBC: 5.61 MIL/uL — ABNORMAL HIGH (ref 3.87–5.11)
RDW: 14.1 % (ref 11.5–15.5)
WBC: 15.4 10*3/uL — ABNORMAL HIGH (ref 4.0–10.5)
nRBC: 0 % (ref 0.0–0.2)

## 2022-02-05 LAB — POCT I-STAT 7, (LYTES, BLD GAS, ICA,H+H)
Acid-Base Excess: 3 mmol/L — ABNORMAL HIGH (ref 0.0–2.0)
Acid-Base Excess: 2 mmol/L (ref 0.0–2.0)
Bicarbonate: 29.5 mmol/L — ABNORMAL HIGH (ref 20.0–28.0)
Bicarbonate: 25.8 mmol/L (ref 20.0–28.0)
Calcium, Ion: 1.26 mmol/L (ref 1.15–1.40)
Calcium, Ion: 1.28 mmol/L (ref 1.15–1.40)
HCT: 39 % (ref 36.0–46.0)
HCT: 37 % (ref 36.0–46.0)
Hemoglobin: 13.3 g/dL (ref 12.0–15.0)
Hemoglobin: 12.6 g/dL (ref 12.0–15.0)
O2 Saturation: 100 %
O2 Saturation: 69 %
Patient temperature: 99
Potassium: 2.9 mmol/L — ABNORMAL LOW (ref 3.5–5.1)
Potassium: 3.6 mmol/L (ref 3.5–5.1)
Sodium: 141 mmol/L (ref 135–145)
Sodium: 146 mmol/L — ABNORMAL HIGH (ref 135–145)
TCO2: 31 mmol/L (ref 22–32)
TCO2: 27 mmol/L (ref 22–32)
pCO2 arterial: 52.4 mmHg — ABNORMAL HIGH (ref 32–48)
pCO2 arterial: 38.5 mmHg (ref 32–48)
pH, Arterial: 7.358 (ref 7.35–7.45)
pH, Arterial: 7.436 (ref 7.35–7.45)
pO2, Arterial: 240 mmHg — ABNORMAL HIGH (ref 83–108)
pO2, Arterial: 35 mmHg — CL (ref 83–108)

## 2022-02-05 LAB — URINALYSIS, COMPLETE (UACMP) WITH MICROSCOPIC
Bilirubin Urine: NEGATIVE
Glucose, UA: NEGATIVE mg/dL
Hgb urine dipstick: NEGATIVE
Ketones, ur: NEGATIVE mg/dL
Leukocytes,Ua: NEGATIVE
Nitrite: NEGATIVE
Protein, ur: 100 mg/dL — AB
RBC / HPF: NONE SEEN RBC/hpf (ref 0–5)
Specific Gravity, Urine: <1.005 — ABNORMAL LOW (ref 1.005–1.030)
WBC, UA: NONE SEEN WBC/hpf (ref 0–5)
pH: 6 (ref 5.0–8.0)

## 2022-02-05 LAB — MRSA NEXT GEN BY PCR, NASAL: MRSA by PCR Next Gen: NOT DETECTED

## 2022-02-05 LAB — RESP PANEL BY RT-PCR (FLU A&B, COVID) ARPGX2
Influenza A by PCR: NEGATIVE
Influenza B by PCR: NEGATIVE
SARS Coronavirus 2 by RT PCR: NEGATIVE

## 2022-02-05 LAB — COMPREHENSIVE METABOLIC PANEL
ALT: 26 U/L (ref 0–44)
AST: 33 U/L (ref 15–41)
Albumin: 3.9 g/dL (ref 3.5–5.0)
Alkaline Phosphatase: 65 U/L (ref 38–126)
Anion gap: 15 (ref 5–15)
BUN: 16 mg/dL (ref 6–20)
CO2: 22 mmol/L (ref 22–32)
Calcium: 9.1 mg/dL (ref 8.9–10.3)
Chloride: 110 mmol/L (ref 98–111)
Creatinine, Ser: 0.85 mg/dL (ref 0.44–1.00)
GFR, Estimated: >60 mL/min (ref >60)
Glucose, Bld: 188 mg/dL — ABNORMAL HIGH (ref 70–99)
Potassium: 2.6 mmol/L — CL (ref 3.5–5.1)
Sodium: 147 mmol/L — ABNORMAL HIGH (ref 135–145)
Total Bilirubin: 0.7 mg/dL (ref 0.3–1.2)
Total Protein: 7.5 g/dL (ref 6.5–8.1)

## 2022-02-05 LAB — DIFFERENTIAL
Abs Immature Granulocytes: 0.11 10*3/uL — ABNORMAL HIGH (ref 0.00–0.07)
Basophils Absolute: 0.1 10*3/uL (ref 0.0–0.1)
Basophils Relative: 1 %
Eosinophils Absolute: 0 10*3/uL (ref 0.0–0.5)
Eosinophils Relative: 0 %
Immature Granulocytes: 1 %
Lymphocytes Relative: 13 %
Lymphs Abs: 2.1 10*3/uL (ref 0.7–4.0)
Monocytes Absolute: 0.5 10*3/uL (ref 0.1–1.0)
Monocytes Relative: 3 %
Neutro Abs: 12.7 10*3/uL — ABNORMAL HIGH (ref 1.7–7.7)
Neutrophils Relative %: 82 %

## 2022-02-05 LAB — I-STAT CHEM 8, ED
BUN: 19 mg/dL (ref 6–20)
Calcium, Ion: 1.02 mmol/L — ABNORMAL LOW (ref 1.15–1.40)
Chloride: 106 mmol/L (ref 98–111)
Creatinine, Ser: 0.8 mg/dL (ref 0.44–1.00)
Glucose, Bld: 180 mg/dL — ABNORMAL HIGH (ref 70–99)
HCT: 47 % — ABNORMAL HIGH (ref 36.0–46.0)
Hemoglobin: 16 g/dL — ABNORMAL HIGH (ref 12.0–15.0)
Potassium: 2.8 mmol/L — ABNORMAL LOW (ref 3.5–5.1)
Sodium: 141 mmol/L (ref 135–145)
TCO2: 23 mmol/L (ref 22–32)

## 2022-02-05 LAB — SODIUM
Sodium: 138 mmol/L (ref 135–145)
Sodium: 143 mmol/L (ref 135–145)
Sodium: 148 mmol/L — ABNORMAL HIGH (ref 135–145)

## 2022-02-05 LAB — PROTIME-INR
INR: 1.2 (ref 0.8–1.2)
Prothrombin Time: 14.8 seconds (ref 11.4–15.2)

## 2022-02-05 LAB — CBG MONITORING, ED: Glucose-Capillary: 162 mg/dL — ABNORMAL HIGH (ref 70–99)

## 2022-02-05 LAB — APTT: aPTT: 30 seconds (ref 24–36)

## 2022-02-05 LAB — I-STAT BETA HCG BLOOD, ED (MC, WL, AP ONLY): I-stat hCG, quantitative: <5 m[IU]/mL (ref <5)

## 2022-02-05 MED ORDER — PROPOFOL 1000 MG/100ML IV EMUL
5.0000 ug/kg/min | INTRAVENOUS | Status: DC
Start: 2022-02-05 — End: 2022-02-05

## 2022-02-05 MED ORDER — STROKE: EARLY STAGES OF RECOVERY BOOK
Status: AC
  Filled 2022-02-05: qty 1

## 2022-02-05 MED ORDER — LOSARTAN POTASSIUM 50 MG PO TABS
50.0000 mg | ORAL_TABLET | Freq: Two times a day (BID) | ORAL | Status: DC
  Administered 2022-02-05: 50 mg
  Filled 2022-02-05: qty 1

## 2022-02-05 MED ORDER — CLEVIDIPINE BUTYRATE 0.5 MG/ML IV EMUL
0.0000 mg/h | INTRAVENOUS | Status: DC
  Administered 2022-02-05: 13 mg/h via INTRAVENOUS
  Administered 2022-02-05: 10 mg/h via INTRAVENOUS
  Administered 2022-02-05 (×2): 21 mg/h via INTRAVENOUS
  Administered 2022-02-05: 12 mg/h via INTRAVENOUS
  Filled 2022-02-05 (×6): qty 50

## 2022-02-05 MED ORDER — CHLORHEXIDINE GLUCONATE CLOTH 2 % EX PADS
6.0000 | MEDICATED_PAD | Freq: Every day | CUTANEOUS | Status: DC
  Administered 2022-02-05 – 2022-03-05 (×26): 6 via TOPICAL

## 2022-02-05 MED ORDER — FENTANYL 2500MCG IN NS 250ML (10MCG/ML) PREMIX INFUSION
50.0000 ug/h | INTRAVENOUS | Status: DC
  Administered 2022-02-05: 50 ug/h via INTRAVENOUS
  Administered 2022-02-06: 150 ug/h via INTRAVENOUS
  Administered 2022-02-07: 75 ug/h via INTRAVENOUS
  Administered 2022-02-07: 100 ug/h via INTRAVENOUS
  Administered 2022-02-08: 75 ug/h via INTRAVENOUS
  Administered 2022-02-09: 150 ug/h via INTRAVENOUS
  Administered 2022-02-10 – 2022-02-12 (×3): 100 ug/h via INTRAVENOUS
  Administered 2022-02-13: 150 ug/h via INTRAVENOUS
  Administered 2022-02-13: 100 ug/h via INTRAVENOUS
  Administered 2022-02-14 – 2022-02-15 (×2): 150 ug/h via INTRAVENOUS
  Filled 2022-02-05 (×14): qty 250

## 2022-02-05 MED ORDER — LOSARTAN POTASSIUM 50 MG PO TABS
50.0000 mg | ORAL_TABLET | Freq: Two times a day (BID) | ORAL | Status: DC
Start: 2022-02-05 — End: 2022-02-05

## 2022-02-05 MED ORDER — PROPOFOL 1000 MG/100ML IV EMUL
INTRAVENOUS | Status: AC
Start: 2022-02-05 — End: 2022-02-05
  Administered 2022-02-05: 30 ug/kg/min via INTRAVENOUS
  Filled 2022-02-05: qty 100

## 2022-02-05 MED ORDER — AMLODIPINE BESYLATE 10 MG PO TABS
10.0000 mg | ORAL_TABLET | Freq: Every day | ORAL | Status: DC
Start: 2022-02-05 — End: 2022-03-22
  Administered 2022-02-05 – 2022-03-21 (×44): 10 mg
  Filled 2022-02-05 (×45): qty 1

## 2022-02-05 MED ORDER — STROKE: EARLY STAGES OF RECOVERY BOOK: Freq: Once | Status: AC

## 2022-02-05 MED ORDER — MIDAZOLAM HCL 2 MG/2ML IJ SOLN
4.0000 mg | Freq: Once | INTRAMUSCULAR | Status: AC
  Administered 2022-02-05: 4 mg via INTRAVENOUS
  Filled 2022-02-05: qty 4

## 2022-02-05 MED ORDER — SODIUM CHLORIDE 0.9% FLUSH: 3.0000 mL | Freq: Once | INTRAVENOUS | Status: DC

## 2022-02-05 MED ORDER — SUCCINYLCHOLINE CHLORIDE 20 MG/ML IJ SOLN
INTRAMUSCULAR | Status: DC | PRN
  Administered 2022-02-05: 100 mg via INTRAVENOUS

## 2022-02-05 MED ORDER — CHLORHEXIDINE GLUCONATE 0.12% ORAL RINSE (MEDLINE KIT)
15.0000 mL | Freq: Two times a day (BID) | OROMUCOSAL | Status: DC
  Administered 2022-02-05 – 2022-03-16 (×77): 15 mL via OROMUCOSAL

## 2022-02-05 MED ORDER — DEXAMETHASONE SODIUM PHOSPHATE 4 MG/ML IJ SOLN
4.0000 mg | Freq: Four times a day (QID) | INTRAMUSCULAR | Status: AC
  Administered 2022-02-06 – 2022-02-11 (×22): 4 mg via INTRAVENOUS
  Filled 2022-02-05 (×22): qty 1

## 2022-02-05 MED ORDER — IOHEXOL 350 MG/ML SOLN
75.0000 mL | Freq: Once | INTRAVENOUS | Status: AC | PRN
  Administered 2022-02-05: 75 mL via INTRAVENOUS

## 2022-02-05 MED ORDER — FENTANYL BOLUS VIA INFUSION
50.0000 ug | INTRAVENOUS | Status: DC | PRN
  Administered 2022-02-12: 200 ug via INTRAVENOUS
  Administered 2022-02-13 (×2): 100 ug via INTRAVENOUS
  Administered 2022-02-13: 50 ug via INTRAVENOUS
  Administered 2022-02-14: 100 ug via INTRAVENOUS
  Filled 2022-02-05: qty 100

## 2022-02-05 MED ORDER — ACETAMINOPHEN 650 MG RE SUPP: 650.0000 mg | RECTAL | Status: DC | PRN

## 2022-02-05 MED ORDER — POTASSIUM CHLORIDE 10 MEQ/100ML IV SOLN
10.0000 meq | INTRAVENOUS | Status: AC
  Administered 2022-02-05 (×2): 10 meq via INTRAVENOUS
  Filled 2022-02-05 (×2): qty 100

## 2022-02-05 MED ORDER — POTASSIUM CHLORIDE 10 MEQ/50ML IV SOLN: 10.0000 meq | INTRAVENOUS | Status: DC

## 2022-02-05 MED ORDER — ACETAMINOPHEN 160 MG/5ML PO SOLN
650.0000 mg | ORAL | Status: DC | PRN
  Administered 2022-02-12 – 2022-03-16 (×9): 650 mg
  Filled 2022-02-05 (×12): qty 20.3

## 2022-02-05 MED ORDER — POTASSIUM CHLORIDE 20 MEQ PO PACK
60.0000 meq | PACK | Freq: Once | ORAL | Status: AC
  Administered 2022-02-05: 60 meq
  Filled 2022-02-05: qty 3

## 2022-02-05 MED ORDER — ETOMIDATE 2 MG/ML IV SOLN
INTRAVENOUS | Status: DC | PRN
  Administered 2022-02-05: 20 mg via INTRAVENOUS

## 2022-02-05 MED ORDER — DEXAMETHASONE SODIUM PHOSPHATE 10 MG/ML IJ SOLN
10.0000 mg | Freq: Once | INTRAMUSCULAR | Status: AC
  Administered 2022-02-05: 10 mg via INTRAVENOUS
  Filled 2022-02-05: qty 1

## 2022-02-05 MED ORDER — POLYETHYLENE GLYCOL 3350 17 G PO PACK
17.0000 g | PACK | Freq: Every day | ORAL | Status: DC
  Administered 2022-02-05 – 2022-02-15 (×9): 17 g
  Filled 2022-02-05 (×10): qty 1

## 2022-02-05 MED ORDER — CARVEDILOL 12.5 MG PO TABS
25.0000 mg | ORAL_TABLET | Freq: Two times a day (BID) | ORAL | Status: DC
  Administered 2022-02-05 (×2): 25 mg
  Filled 2022-02-05 (×2): qty 2

## 2022-02-05 MED ORDER — PANTOPRAZOLE SODIUM 40 MG IV SOLR
40.0000 mg | Freq: Every day | INTRAVENOUS | Status: DC
  Administered 2022-02-05 – 2022-02-12 (×8): 40 mg via INTRAVENOUS
  Filled 2022-02-05 (×8): qty 10

## 2022-02-05 MED ORDER — PROPOFOL 1000 MG/100ML IV EMUL
5.0000 ug/kg/min | INTRAVENOUS | Status: DC
  Administered 2022-02-05: 80 ug/kg/min via INTRAVENOUS
  Administered 2022-02-05 (×4): 50 ug/kg/min via INTRAVENOUS
  Administered 2022-02-06: 20 ug/kg/min via INTRAVENOUS
  Administered 2022-02-06 (×2): 25 ug/kg/min via INTRAVENOUS
  Administered 2022-02-07: 35 ug/kg/min via INTRAVENOUS
  Administered 2022-02-07 (×2): 30 ug/kg/min via INTRAVENOUS
  Filled 2022-02-05 (×11): qty 100

## 2022-02-05 MED ORDER — SODIUM CHLORIDE 3 % IV SOLN
INTRAVENOUS | Status: DC
  Filled 2022-02-05 (×6): qty 500

## 2022-02-05 MED ORDER — ACETAMINOPHEN 325 MG PO TABS
650.0000 mg | ORAL_TABLET | ORAL | Status: DC | PRN
  Administered 2022-02-13 – 2022-03-18 (×12): 650 mg
  Filled 2022-02-05 (×13): qty 2

## 2022-02-05 MED ORDER — CLEVIDIPINE BUTYRATE 0.5 MG/ML IV EMUL
INTRAVENOUS | Status: AC
  Administered 2022-02-05: 2 mg/h via INTRAVENOUS
  Filled 2022-02-05: qty 50

## 2022-02-05 MED ORDER — SENNOSIDES-DOCUSATE SODIUM 8.6-50 MG PO TABS
1.0000 | ORAL_TABLET | Freq: Two times a day (BID) | ORAL | Status: DC
  Administered 2022-02-05 – 2022-02-17 (×20): 1
  Filled 2022-02-05 (×24): qty 1

## 2022-02-05 MED ORDER — LABETALOL HCL 5 MG/ML IV SOLN
10.0000 mg | INTRAVENOUS | Status: DC | PRN
  Administered 2022-02-05 – 2022-03-16 (×22): 10 mg via INTRAVENOUS
  Filled 2022-02-05 (×22): qty 4

## 2022-02-05 MED ORDER — FENTANYL CITRATE PF 50 MCG/ML IJ SOSY
100.0000 ug | PREFILLED_SYRINGE | Freq: Once | INTRAMUSCULAR | Status: AC
Start: 2022-02-05 — End: 2022-02-05
  Administered 2022-02-05: 100 ug via INTRAVENOUS
  Filled 2022-02-05: qty 2

## 2022-02-05 MED ORDER — ORAL CARE MOUTH RINSE
15.0000 mL | OROMUCOSAL | Status: DC
  Administered 2022-02-05 – 2022-03-17 (×353): 15 mL via OROMUCOSAL

## 2022-02-05 MED ORDER — CLEVIDIPINE BUTYRATE 0.5 MG/ML IV EMUL
0.0000 mg/h | INTRAVENOUS | Status: DC
  Administered 2022-02-05: 30 mg/h via INTRAVENOUS
  Administered 2022-02-05: 26 mg/h via INTRAVENOUS
  Administered 2022-02-06: 21 mg/h via INTRAVENOUS
  Administered 2022-02-06: 05:00:00 16 mg/h via INTRAVENOUS
  Administered 2022-02-06: 14 mg/h via INTRAVENOUS
  Administered 2022-02-06: 01:00:00 18 mg/h via INTRAVENOUS
  Administered 2022-02-06: 08:00:00 10 mg/h via INTRAVENOUS
  Administered 2022-02-06: 12 mg/h via INTRAVENOUS
  Administered 2022-02-07: 14 mg/h via INTRAVENOUS
  Administered 2022-02-07: 16 mg/h via INTRAVENOUS
  Administered 2022-02-07: 21 mg/h via INTRAVENOUS
  Administered 2022-02-07: 32 mg/h via INTRAVENOUS
  Filled 2022-02-05 (×14): qty 100

## 2022-02-05 MED ORDER — ONDANSETRON HCL 4 MG/2ML IJ SOLN
INTRAMUSCULAR | Status: AC
  Filled 2022-02-05: qty 2

## 2022-02-05 MED ORDER — DOCUSATE SODIUM 50 MG/5ML PO LIQD
100.0000 mg | Freq: Two times a day (BID) | ORAL | Status: DC
  Administered 2022-02-05 – 2022-02-15 (×19): 100 mg
  Filled 2022-02-05 (×19): qty 10

## 2022-02-05 NOTE — Consult Note (Addendum)
Neurosurgery Consultation  Reason for Consult: ICH Referring Physician: Bestler  CC: AMS  HPI: This is a 51 y.o. woman that presents with altered mental status and left sided weakness, LKN was last night at 2100. Family heard her fall out of bed this morning and found her altered w/ left sided weakness. Further history unavailable 2/2 pt's depressed mental status. In the ED, she had low LOC and had to be intubated for airway protection, does have a reported history of poorly controlled hypertension, no known sympathomimetic drug use. No known recent use of anti-platelet or anti-coagulant medications.  ROS: A 14 point ROS was performed and is negative except as noted in the HPI.   PMHx:  Past Medical History:  Diagnosis Date   Hypertension    FamHx:  Family History  Problem Relation Age of Onset   Diabetes Mother    Hypertension Mother    Heart disease Mother    Diabetes Maternal Aunt    Stroke Maternal Uncle    Diabetes Maternal Grandmother    Heart disease Maternal Grandmother    Diabetes Maternal Grandfather    Heart disease Maternal Grandfather    Heart disease Paternal Grandmother    Heart disease Paternal Grandfather    SocHx:  reports that she has never smoked. She has never used smokeless tobacco. She reports that she does not currently use alcohol. She reports that she does not use drugs.  Exam: Vital signs in last 24 hours: Temp:  [97.6 F (36.4 C)] 97.6 F (36.4 C) (02/19 0810) Pulse Rate:  [75-155] 110 (02/19 0843) Resp:  [16-32] 32 (02/19 0843) BP: (173-253)/(86-165) 179/93 (02/19 0843) SpO2:  [83 %-100 %] 100 % (02/19 0851) FiO2 (%):  [100 %] 100 % (02/19 0851) Weight:  [74.8 kg] 74.8 kg (02/19 0913) General: Lying in hospital bed, intubated, appears acutely ill Head: Normocephalic and atruamatic HEENT: Neck supple Pulmonary: intubated, good chest rise bilaterally Cardiac: HTNive on monitor, tachycardic but regular Abdomen: S NT ND Extremities: Warm and  well perfused x4 Neuro: Intubated, eyes open to voice, not FC, pupils 3-->58mm bilaterally, gaze conjugate, unable to assess facial symmetry 2/2 tube holder / intubation / aphasia Moving R side spontaneously, minimal w/d of the left side to painful stimulus, +toes upgoing on the left, downgoing on the right, with left hip externally rotated    Assessment and Plan: 51 y.o. woman w/ progressive LOC and left sided weakness. CTH / CTA personally reviewed, which shows right BG ICH, CTA no e/o associated aneurysm / AVM or active extstrav. Hematoma measures roughly 30cc, does already have 88mm of midline shift (with brain compression). Already getting 3%, very difficult to control HTNive, actively getting multiple meds to try to control better.   -given the decline / intubation, will repeat the [facility] to make sure she isn't having active expansion. If so, bad situation but, depending on imaging findings, may be a candidate for an attempt at hematoma evacuation. First CTH volume was just at 30cc, so if it expands it may meet criteria.  -please call with any concerns or questions -rpt CTH reviewed, stable findings w/o sig expansion, can hold off on surgical intervention for now   Jadene Pierini, MD 02/05/22 9:26 AM [facility]

## 2022-02-05 NOTE — Progress Notes (Signed)
CXR inconclusive for positioning of central line. Obtained CVP and blood gas from line per Posey Boyer NP. OK to use per Dr. Delton Coombes.

## 2022-02-05 NOTE — Progress Notes (Signed)
This RN listening for pt while primary RN in MRI with her other pt. Was told to increase prop to 80 to try and decrease BP to goal. Fentanyl also increase. Pt now maxed on prop, fent, clevi and at that time PRN not available for an hour. Per tube meds given a hour prior. A-line SBP sustaining in 170's cuff SBP 140's. Manual taken 156/94. Dr.Stack notified and orders to increase max rate of cleviprex to 32mg /hr.

## 2022-02-05 NOTE — Procedures (Signed)
Arterial Catheter Insertion Procedure Note  Sarah Myers  [id number]  11/20/1971  Date:02/05/22  Time:12:24 PM    Provider Performing: Lanier Clam    Procedure: Insertion of Arterial Line (22482) with US guidance (50037)   Indication(s) Blood pressure monitoring and/or need for frequent ABGs  Consent Risks of the procedure as well as the alternatives and risks of each were explained to the patient and/or caregiver.  Consent for the procedure was obtained and is signed in the bedside chart  Anesthesia None   Time Out Verified patient identification, verified procedure, site/side was marked, verified correct patient position, special equipment/implants available, medications/allergies/relevant history reviewed, required imaging and test results available.   Sterile Technique Maximal sterile technique including full sterile barrier drape, hand hygiene, sterile gown, sterile gloves, mask, hair covering, sterile ultrasound probe cover (if used).   Procedure Description Area of catheter insertion was cleaned with chlorhexidine and draped in sterile fashion. Two ultrasound guidede attempts at Left radial arterial line placement were not successful, catheter would not thread over guidewire, which passed through arrow into vessel with ease. Decided to proceed with femoral placement. Area cleaned and draped in usual sterile fashion. With real-time ultrasound guidance an arterial catheter was placed into the left femoral artery.  Appropriate arterial tracings confirmed on monitor.     Complications/Tolerance None; patient tolerated the procedure well.   EBL Minimal   Specimen(s) None    Tessie Fass MSN, AGACNP-BC [facility] Pulmonary/Critical Care Medicine Amion for pager  02/05/2022, 12:25 PM

## 2022-02-05 NOTE — Progress Notes (Signed)
Patient transported from ED to 4N31 without complications.

## 2022-02-05 NOTE — H&P (Signed)
NEUROLOGY CONSULTATION NOTE   Date of service: February 05, 2022 Patient Name: Sarah Myers MRN:  [id number] DOB:  October 07, 1971 Reason for consult: stroke code Requesting physician: Dr. Calvert Cantor _ _ _   _ __   _ __ _ _  __ __   _ __   __ _  History of Present Illness    This is a 51 yo woman with uncontrolled HTN who was BIB EMS this AM for L sided hemiplegia and dysarthria. LKW 2100 last night before bed. At [phone] this AM she was heard by family to fall out of bed and when they went to check on her she was not moving her L side. NIHSS = 14. Head CT showed large R lentiform ICH with extension into the R temporal lobe. ICH score = 2. Her exam deteriorated and she was intubated for airway protection. SBP up to 253 in ED, difficult to control but was able to get <150 with maximum clevidipine infusion and propofol for sedation. Repeat head CT at 2.5 hrs was stable. NSU consulted who recommended no surgical intervention at this time but will consider if bleed expands. Patient is not on anticoagulation.   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   ROS   UTA 2/2 mental status, per daughters no pmhx except uncontrolled HTN  Past History   I have reviewed the following:  Past Medical History:  Diagnosis Date   Hypertension    Past Surgical History:  Procedure Laterality Date   BREAST LUMPECTOMY  1992   CESAREAN SECTION     ORIF RADIAL FRACTURE  02/10/2012   Procedure: OPEN REDUCTION INTERNAL FIXATION (ORIF) RADIAL FRACTURE;  Surgeon: Meredith Pel, MD;  Location: [place];  Service: Orthopedics;  Laterality: Right;   ORIF ULNAR FRACTURE  02/10/2012   Procedure: OPEN REDUCTION INTERNAL FIXATION (ORIF) ULNAR FRACTURE;  Surgeon: Meredith Pel, MD;  Location: [place];  Service: Orthopedics;  Laterality: Right;   TUBAL  LIGATION     Family History  Problem Relation Age of Onset   Diabetes Mother    Hypertension Mother    Heart disease Mother    Diabetes Maternal Aunt    Stroke Maternal Uncle    Diabetes Maternal Grandmother    Heart disease Maternal Grandmother    Diabetes Maternal Grandfather    Heart disease Maternal Grandfather    Heart disease Paternal Grandmother    Heart disease Paternal Grandfather    Social History   Socioeconomic History   Marital status: Married    Spouse name: Not on file   Number of children: Not on file   Years of education: Not on file   Highest education level: Not on file  Occupational History   Not on file  Tobacco Use   Smoking status: Never   Smokeless tobacco: Never  Vaping Use   Vaping Use: Never used  Substance and Sexual Activity   Alcohol use: Not Currently    Alcohol/week: 0.0 standard drinks   Drug use: No   Sexual activity: Never  Other Topics Concern   Not on file  Social History Narrative   Not on file   Social Determinants of Health   Financial Resource Strain: Not on file  Food Insecurity: Not on file  Transportation Needs: Not on file  Physical Activity: Not on file  Stress: Not on file  Social Connections: Not on file   Allergies  Allergen Reactions   Lisinopril Cough    Medications   (Not in a hospital admission)     Current Facility-Administered Medications:    clevidipine (CLEVIPREX) infusion 0.5 mg/mL, 0-21 mg/hr, Intravenous, Continuous, Jefferson Fuel, MD, Last Rate: 42 mL/hr at 02/05/22 0834, 21 mg/hr at 02/05/22 0834   labetalol (NORMODYNE) injection 10 mg, 10 mg, Intravenous, Q2H PRN, Jefferson Fuel, MD   propofol (DIPRIVAN) 1000 MG/100ML infusion, , , ,    sodium chloride (hypertonic) 3 % solution, , Intravenous, Continuous, Jefferson Fuel, MD   sodium chloride flush (NS) 0.9 % injection 3 mL, 3 mL, Intravenous, Once, Pollyann Savoy, MD  Current Outpatient Medications:    APPLE CIDER VINEGAR PO,  Take 1 capsule by mouth daily. (Patient not taking: Reported on 09/19/2021), Disp: , Rfl:    carvedilol (COREG) 25 MG tablet, TAKE 1 TABLET BY MOUTH TWICE DAILY WITH A MEAL, Disp: 60 tablet, Rfl: 0   ferrous sulfate 324 (65 Fe) MG TBEC, Take 1 tablet (325 mg total) by mouth every other day. (Patient not taking: Reported on 09/19/2021), Disp: 90 tablet, Rfl: 0   hydrochlorothiazide (HYDRODIURIL) 25 MG tablet, Take 1 tablet by mouth once daily, Disp: 90 tablet, Rfl: 0   hydrOXYzine (ATARAX/VISTARIL) 10 MG tablet, Take 1 tablet (10 mg total) by mouth 3 (three) times daily as needed. (Patient not taking: Reported on 09/19/2021), Disp: 30 tablet, Rfl: 0   Vitamin D, Ergocalciferol, (DRISDOL) 1.25 MG (50000 UNIT) CAPS capsule, Take 1 capsule (50,000 Units total) by mouth every 7 (seven) days. (Patient not taking: Reported on 09/19/2021), Disp: 8 capsule, Rfl: 0  Vitals   Vitals:   02/05/22 0827 02/05/22 0830 02/05/22 0834 02/05/22 0835  BP: (!) 195/97 (!) 173/86 (!) 234/120   Pulse: (!) 107 (!) 112 (!) 151 (!) 155  Resp: (!) 23 (!) 23 (!) 24 (!) 28  Temp:      TempSrc:      SpO2: 99% 100% (!) 83% 100%     There is no height or weight on file to calculate BMI.  Physical Exam   Physical Exam Gen: arousable to mild stim, follows most simple commands, oriented to self, month, and daughter. Increasingly somnolent over course of exam Resp: increased WOB prior to intubation CV: regular and tachycardic  Neuro: *MS: arousable to mild stim, follows most simple commands, oriented to self, month, and daughter *Speech: severe dysarthria, <50% intelligible *CN: PERRL, blinks to threat bilat, EOMI, severe L UMN facial droop, hearing intact to voice *Motor: 5/5 RUE and RLE throughout. No movement LUE and LLE *Sensory: no movement to noxious stimuli on L Reflexes: 2+ brisk L side, 2+ R side, upgoing toes on L  NIHSS  1a Level of Conscious.: 1 1b LOC Questions: 1 1c LOC Commands: 0 2 Best Gaze: 0 3  Visual: 0 4 Facial Palsy: 2 5a Motor Arm - left: 4 5b Motor Arm - Right: 0 6a Motor Leg - Left: 4 6b Motor Leg - Right: 0 7 Limb Ataxia: 0 8 Sensory: 0 9 Best Language: 0 10 Dysarthria: 2 11 Extinct. and Inatten.: 0  TOTAL: 14   Premorbid mRS = 0  ICH score = 2 (26% mortality)   Labs   CBC: No results for input(s): WBC, NEUTROABS, HGB, HCT, MCV, PLT in the last 168 hours.  Basic Metabolic Panel:  Lab Results  Component Value Date  NA 140 08/18/2020   K 3.5 08/18/2020   CO2 25 08/18/2020   GLUCOSE 103 (H) 08/18/2020   BUN 12 08/18/2020   CREATININE 0.70 08/18/2020   CALCIUM 11.1 (H) 08/18/2020   GFRNONAA 104 08/18/2020   GFRAA 119 08/18/2020   Lipid Panel:  Lab Results  Component Value Date   LDLCALC 84 10/30/2019   HgbA1c:  Lab Results  Component Value Date   HGBA1C 5.3 09/26/2016   Urine Drug Screen: No results found for: LABOPIA, COCAINSCRNUR, LABBENZ, AMPHETMU, THCU, LABBARB  Alcohol Level No results found for: ETH   Impression   51 yo woman with hx uncontrolled HTN BIB EMS for acute onset L flaccid hemiplegia and found to have large R lentiform ICH. ICH score = 2. No aneurysm or AVM on CTA, suspect hypertensive etiology. She has 4-78mm midline shift and stable hematoma on repeat CT at 2.5 hours. She is not on anticoagulation.   NSU consulted who recommended no surgical intervention at this time but will consider if bleed expands. Patient is not on anticoagulation.    Recommendations   - Admit to ICU under Dr. Quinn Axe - Clevidipine for goal SBP <150 - SCDs for DVT prophylaxis - Head CT q 6 hrs assess stability of ICH - Intubated on propofol; CCM to consult for vent mgmt - HOB elevated 30 degrees - Hypertonic saline 3% continuous PIV per protocol - Central line placement requested by CCM - NSU following; will consider surgical intervention if bleed expands.   Stroke team will assume care in AM.    This patient is critically ill and at  significant risk of neurological worsening, death and care requires constant monitoring of vital signs, hemodynamics,respiratory and cardiac monitoring, neurological assessment, discussion with family, other specialists and medical decision making of high complexity. I spent 110 minutes of neurocritical care time  in the care of  this patient. This was time spent independent of any time provided by nurse practitioner or PA.   Su Monks, MD [facility] [phone]   If 7pm- 7am, please page neurology on call as listed in [place].

## 2022-02-05 NOTE — Progress Notes (Signed)
°   02/05/22 1700  Vitals  BP (!) 144/84  MAP (mmHg) 101  Pulse Rate 66  ECG Heart Rate 66  Resp (!) 24  Oxygen Therapy  SpO2 98 %  Art Line  Arterial Line BP (S)  174/86 (contacted MD, see progress note)  Arterial Line MAP (mmHg) 115 mmHg   Contacted Dr. Selina Cooley after unable to reduce SBP to parameters, she ordered increase of propofol to 60-80 as needed to help.

## 2022-02-05 NOTE — ED Provider Notes (Signed)
Patient seen and examined, agree with assessment and plan by Resident. I was at bedside for intubation.   Patient here for Code Stroke, LSN was last night. Found to have hypertensive bleed on CT. Started on Cleviprex. Intubated for airway protection. Neurology to admit. Neurosurgery consulted. Rescanned to ensure bleed is not expanding.   .Critical Care Performed by: Pollyann Savoy, MD Authorized by: Pollyann Savoy, MD   Critical care provider statement:    Critical care time (minutes):  75   Critical care time was exclusive of:  Separately billable procedures and treating other patients   Critical care was necessary to treat or prevent imminent or life-threatening deterioration of the following conditions:  CNS failure or compromise   Critical care was time spent personally by me on the following activities:  Development of treatment plan with patient or surrogate, discussions with consultants, evaluation of patient's response to treatment, examination of patient, ordering and review of laboratory studies, ordering and review of radiographic studies, ordering and performing treatments and interventions, pulse oximetry, re-evaluation of patient's condition, review of old charts, gastric intubation and obtaining history from patient or surrogate   Care discussed with: admitting provider      Pollyann Savoy, MD 02/05/22 [phone]

## 2022-02-05 NOTE — Progress Notes (Signed)
RT reported ABG to Delorise Shiner, NP @1008   ABG as follows: 7.35/52.4/240/29.5 NP increased RR to 24 and decreased FiO2 to 60%. RT will continue to monitor.

## 2022-02-05 NOTE — Consult Note (Signed)
NAME:  Sarah Myers, MRN:  [id number], DOB:  Jan 24, 1971, LOS: 0 ADMISSION DATE:  02/05/2022, CONSULTATION DATE:  02/05/22 REFERRING MD:  Selina Cooley - neuro , CHIEF COMPLAINT:  Mechanically Ventilated  // ICH    History of Present Illness:  51 yo F PMH HTN presented to [facility] 2/19 with CC AMS. LKW 2100 2/18, at 0600 2/19 found to have L sided weakness, slurred speech. Evaluated as code stroke and found to have R side ICH with mass effect (brain compression) present on admission.  Intubated in ED for airway protection. Started on 3% saline, cleviprex. Admitted to Neuro team  PCCM consulted in this setting to assist with medical management   iSTAT chem -- Na 141 K 2.8   Serum CBC- WBC 15.4 hgb 16 HCT 47  Pertinent  Medical History   HTN  Significant Hospital Events: Including procedures, antibiotic start and stop dates in addition to other pertinent events   2/19 admitted to neuro with acute R ICH. Intubated for airway protection. Started on 3% and  cleviprex. PCCM consult. NSGY consult   Interim History / Subjective:   Intubated  On 3%, cleviprex   Admitting CMP is still pending -- iSTAT chem has resulted and serum CBC has resulted   Objective   Blood pressure (!) 179/93, pulse (!) 114, temperature 98 F (36.7 C), temperature source Oral, resp. rate (!) 32, height 5\' 4"  (1.626 m), weight 74.8 kg, SpO2 97 %.    Vent Mode: PRVC FiO2 (%):  [100 %] 100 % Set Rate:  [18 bmp] 18 bmp Vt Set:  [430 mL] 430 mL PEEP:  [5 cmH20] 5 cmH20 Plateau Pressure:  [19 cmH20] 19 cmH20  No intake or output data in the 24 hours ending 02/05/22 1029 Filed Weights   02/05/22 0913  Weight: 74.8 kg    Examination: General: Critically ill appearing middle aged F intubated sedated NAD  HENT: NCAT pink mm ETT secure  Lungs: CTAb. Mechanically ventilated  Cardiovascular: rrr s1s2 no rgm  Abdomen: soft ndnt + bowel sounds  Extremities: no acute joint deformity no cyanosis or clubbing  Neuro:  Sedated. Pinpoint pupils. Withdraw BLE to pain.  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 -- " Brain Compression" -- present on admission  Incidental finding:  proximal L PCA severe stenosis vs occlusion  P  -3% saline goal Na 150-155 -cleviprex for BP control -will add steroids -Repeat imaging per neuro  -not operative at this time, seen by NSGY -may require CVC, arterial line placement -- will d.w daughters   Hypertensive emergency Underlying uncontrolled HTN -home coreg and HCTZ P -with acute ICH, SBP goal < 150  -cleviprex gtt  Hypokalemia -replace -follow CMP  Leukocytosis - suspect reactive 2/2 ICH P -monitor off abx    Best Practice (right click and "Reselect all SmartList Selections" daily)   Diet/type: tubefeeds DVT prophylaxis: SCD GI prophylaxis: PPI Lines: N/A Foley:  Yes, and it is still needed Code Status:  full code Last date of multidisciplinary goals of care discussion [per primary]  Labs   CBC: Recent Labs  Lab 02/05/22 0850 02/05/22 0907 02/05/22 1008  WBC 15.4*  --   --  NEUTROABS 12.7*  --   --   HGB 16.0* 16.0* 13.3  HCT 47.2* 47.0* 39.0  MCV 84.1  --   --   PLT 247  --   --     Basic Metabolic Panel: Recent Labs  Lab 02/05/22 0907 02/05/22 1008  NA 141 141  K 2.8* 2.9*  CL 106  --   GLUCOSE 180*  --   BUN 19  --   CREATININE 0.80  --    GFR: Estimated Creatinine Clearance: 84.2 mL/min (by C-G formula based on SCr of 0.8 mg/dL). Recent Labs  Lab 02/05/22 0850  WBC 15.4*    Liver Function Tests: No results for input(s): AST, ALT, ALKPHOS, BILITOT, PROT, ALBUMIN in the last 168 hours. No results for input(s): LIPASE,  AMYLASE in the last 168 hours. No results for input(s): AMMONIA in the last 168 hours.  ABG    Component Value Date/Time   PHART 7.358 02/05/2022 1008   PCO2ART 52.4 (H) 02/05/2022 1008   PO2ART 240 (H) 02/05/2022 1008   HCO3 29.5 (H) 02/05/2022 1008   TCO2 31 02/05/2022 1008   O2SAT 100 02/05/2022 1008     Coagulation Profile: Recent Labs  Lab 02/05/22 0915  INR 1.2    Cardiac Enzymes: No results for input(s): CKTOTAL, CKMB, CKMBINDEX, TROPONINI in the last 168 hours.  HbA1C: Hemoglobin A1C  Date/Time Value Ref Range Status  09/26/2016 11:38 AM 5.3  Final    CBG: Recent Labs  Lab 02/05/22 0737  GLUCAP 162*    Review of Systems:   Unable to obtain, intubated and sedated   Past Medical History:  She,  has a past medical history of Hypertension.   Surgical History:   Past Surgical History:  Procedure Laterality Date   BREAST LUMPECTOMY  1992   CESAREAN SECTION     ORIF RADIAL FRACTURE  02/10/2012   Procedure: OPEN REDUCTION INTERNAL FIXATION (ORIF) RADIAL FRACTURE;  Surgeon: Cammy Copa, MD;  Location: [facility] OR;  Service: Orthopedics;  Laterality: Right;   ORIF ULNAR FRACTURE  02/10/2012   Procedure: OPEN REDUCTION INTERNAL FIXATION (ORIF) ULNAR FRACTURE;  Surgeon: Cammy Copa, MD;  Location[facility] OR;  Service: Orthopedics;  Laterality: Right;   TUBAL LIGATION       Social History:   reports that she has never smoked. She has never used smokeless tobacco. She reports that she does not currently use alcohol. She reports that she does not use drugs.   Family History:  Her family history includes Diabetes in her maternal aunt, maternal grandfather, maternal grandmother, and mother; Heart disease in her maternal grandfather, maternal grandmother, mother, paternal grandfather, and paternal grandmother; Hypertension in her mother; Stroke in her maternal uncle.   Allergies Allergies  Allergen Reactions   Lisinopril Cough     Home Medications  Prior  to Admission medications   Medication Sig Start Date End Date Taking? Authorizing Provider  APPLE CIDER VINEGAR PO Take 1 capsule by mouth daily. Patient not taking: Reported on 09/19/2021    [provider]  carvedilol (COREG) 25 MG tablet TAKE 1 TABLET BY MOUTH TWICE DAILY WITH A MEAL 01/13/22   [name], MD  ferrous sulfate 324 (65 Fe) MG TBEC Take 1 tablet (325 mg total) by mouth every other day. Patient not taking: Reported on 09/19/2021 06/30/20   Arvilla Market, MD  hydrochlorothiazide (HYDRODIURIL) 25 MG tablet Take 1 tablet by mouth once daily 12/14/21   [name], MD  hydrOXYzine (ATARAX/VISTARIL)  10 MG tablet Take 1 tablet (10 mg total) by mouth 3 (three) times daily as needed. Patient not taking: Reported on 09/19/2021 06/29/20   Arvilla Market, MD  Vitamin D, Ergocalciferol, (DRISDOL) 1.25 MG (50000 UNIT) CAPS capsule Take 1 capsule (50,000 Units total) by mouth every 7 (seven) days. Patient not taking: Reported on 09/19/2021 06/30/20   Arvilla Market, MD     Critical care time: 48 min      CRITICAL CARE Performed by: Lanier Clam   Total critical care time: 48 minutes  Critical care time was exclusive of separately billable procedures and treating other patients. Critical care was necessary to treat or prevent imminent or life-threatening deterioration.  Critical care was time spent personally by me on the following activities: development of treatment plan with patient and/or surrogate as well as nursing, discussions with consultants, evaluation of patient's response to treatment, examination of patient, obtaining history from patient or surrogate, ordering and performing treatments and interventions, ordering and review of laboratory studies, ordering and review of radiographic studies, pulse oximetry and re-evaluation of patient's condition.  Tessie Fass MSN, AGACNP-BC [facility] Pulmonary/Critical Care Medicine Amion for  pager  02/05/2022, 10:29 AM

## 2022-02-05 NOTE — ED Triage Notes (Signed)
Pt arrived via [facility] from home as a Code Stroke. Pt LKW 2100 last night. Per EMS, pt's family heard pt fall around 0655 this morning. Per EMS pt has left side paralysis, left sided facial droop, aphasia and slurred speech. Pt hypertensive.

## 2022-02-05 NOTE — Progress Notes (Signed)
Head CT at 6 hrs shows ICH, midline shift stable. BP above range despite 21mg /hr clevidipine, prn labetalol, propofol at 80. Max clevidipine rate changed to 32mg /hr. Orders changed and RN notified.  Su Monks, MD [facility] [phone]  If 7pm- 7am, please page neurology on call as listed in [place].

## 2022-02-05 NOTE — Progress Notes (Addendum)
Date and time results received: 02/05/22 10:44 AM (use smartphrase ".now" to insert current time)  Test: K+ Critical Value: 2.6  Name of Provider Notified: Byrum MD  Orders Received? Or Actions Taken?: see eMAR

## 2022-02-05 NOTE — ED Provider Notes (Signed)
[facility] EMERGENCY DEPARTMENT Provider Note  CSN: [id number] Arrival Date & Time: 02/05/22  0735   History/HPI:  Chief Complaint  Patient presents with   Code Stroke   HPI and ROS are limited by acuity of condition  Sarah Myers who presents today as a code stroke.  She was at her baseline last night.  Woke up this morning was found to be altered.  Not using the left side of her body.  Facial droop on the left side.  Aphasic.  Family called EMS.  EMS reported patient was found to be very hypertensive.  Home Meds: Prior to Admission medications   Medication Sig Start Date End Date Taking? Authorizing Provider  carvedilol (COREG) 25 MG tablet TAKE 1 TABLET BY MOUTH TWICE DAILY WITH A MEAL Patient taking differently: Take 25 mg by mouth 2 (two) times daily with a meal. 01/13/22  Yes [name], MD  hydrochlorothiazide (HYDRODIURIL) 25 MG tablet Take 1 tablet by mouth once daily 12/14/21  Yes [name], MD  APPLE CIDER VINEGAR PO Take 1 capsule by mouth daily. Patient not taking: Reported on 09/19/2021    [provider]  ferrous sulfate 324 (65 Fe) MG TBEC Take 1 tablet (325 mg total) by mouth every other day. Patient not taking: Reported on 09/19/2021 06/30/20   Nicolette Bang, MD  hydrOXYzine (ATARAX/VISTARIL) 10 MG tablet Take 1 tablet (10 mg total) by mouth 3 (three) times daily as needed. Patient not taking: Reported on 09/19/2021 06/29/20   Nicolette Bang, MD  Vitamin D, Ergocalciferol, (DRISDOL) 1.25 MG (50000 UNIT) CAPS capsule Take 1 capsule (50,000 Units total) by mouth every 7 (seven) days. Patient not taking: Reported on 09/19/2021 06/30/20   Nicolette Bang, MD    Allergies: Lisinopril  ROS: Review of Systems  Unable to perform ROS: Acuity of condition   Physical Exam: Physical Exam Constitutional:      General: She is in acute distress.     Appearance: She is obese. She is ill-appearing and  toxic-appearing.  HENT:     Mouth/Throat:     Mouth: Mucous membranes are moist.     Pharynx: No oropharyngeal exudate.  Cardiovascular:     Rate and Rhythm: Tachycardia present.  Pulmonary:     Effort: Pulmonary effort is normal. No respiratory distress.  Abdominal:     General: Abdomen is flat.     Palpations: Abdomen is soft.  Musculoskeletal:     Right lower leg: No edema.     Left lower leg: No edema.  Skin:    General: Skin is warm.  Neurological:     Mental Status: She is alert.     Comments: Patient with left-sided facial droop.  Aphasic.  Decreased eye movements.  Intermittently follows commands.    Procedures: Procedure Name: Intubation Date/Time: 02/05/2022 3:30 PM Performed by: Jacelyn Pi, MD Pre-anesthesia Checklist: Patient identified, Emergency Drugs available, Suction available, Timeout performed and Patient being monitored Oxygen Delivery Method: Non-rebreather mask Preoxygenation: Pre-oxygenation with 100% oxygen Induction Type: Rapid sequence Ventilation: Mask ventilation without difficulty Laryngoscope Size: Mac and 3 Grade View: Grade I Tube size: 7.5 mm Number of attempts: 1 Airway Equipment and Method: Patient positioned with wedge pillow, Rigid stylet, Stylet and Video-laryngoscopy Placement Confirmation: ETT inserted through vocal cords under direct vision, Positive ETCO2, CO2 detector and Breath sounds checked- equal and bilateral Secured at: 24 cm Tube secured with: ETT holder Difficulty Due To: Difficult Airway- due to anterior larynx  MDM/ED Course: This patient presents to the ED for concern of stroke, this involves an extensive number of treatment options, and is a complaint that carries with it a high risk of complications and morbidity.  The differential diagnosis includes stroke, head bleed, infection.   Additional history obtained: Additional history obtained from family and EMS  Records reviewed previous admission  documents, Care Everywhere/External Records, and Primary Care Documents  Lab Tests: I Ordered, and personally interpreted labs.  The pertinent results include: Leukocytosis, hypokalemia.   Imaging Studies ordered: I ordered imaging studies including CT scan head   I independently visualized and interpreted imaging which showed large intraparenchymal bleed I agree with the radiologist interpretation  EKG (personally reviewed and interpreted): No STEMI.  No acute ischemia.   Medical Decision Making: Patient presented in acute distress.  She was hypertensive.  She was altered.  She had weakness on her left side.  She was aphasic.  She was rapidly assessed by neurology and taken to the CT scanner.  She was found have a large intraparenchymal bleed.  She continued to be hypertensive.  She was started on a Cleviprex infusion.  Her mental status deteriorated and there was concern that she was unable to protect her airway.  She was intubated as per above.  She was started on a propofol infusion.  Blood pressure control was achieved with Cleviprex, propofol, fentanyl.  Neurology and neurosurgery were both consulted.  Neurosurgery came to the emergency room and saw the patient.  They do not feel that the patient is a candidate for emergent surgical intervention.  Patient admitted to the neuro ICU for ongoing evaluation and treatment.  Complexity of problems addressed: Patients presentation is most consistent with  acute presentation with potential threat to life or bodily function  Disposition: After consideration of the diagnostic results and the patients response to treatment,  I feel that the patent would benefit from admission to Neuro ICU .   Patient seen in conjunction with my attending, Dr. Karle Starch.  Clinical Impression/Dx: Final diagnoses:  Encounter for central line placement     Rx/Dc Orders: ED Discharge Orders     None            Jacelyn Pi, MD 02/05/22 1531     Truddie Hidden, MD 02/06/22 1536

## 2022-02-05 NOTE — Progress Notes (Signed)
RT transported patient from 4N31 to CT and back with RN. No complications. RT will continue to monitor.

## 2022-02-05 NOTE — Procedures (Signed)
Central Venous Catheter Insertion Procedure Note  LETHIA DONLON  [id number]  December 13, 1971  Date:02/05/22  Time:12:23 PM   Provider Performing:Katharyn Schauer E Kaydin Karbowski   Procedure: Insertion of Non-tunneled Central Venous [phone]) with US guidance (44010)   Indication(s) Medication administration  Consent Risks of the procedure as well as the alternatives and risks of each were explained to the patient and/or caregiver.  Consent for the procedure was obtained and is signed in the bedside chart  Anesthesia Topical only with 1% lidocaine   Timeout Verified patient identification, verified procedure, site/side was marked, verified correct patient position, special equipment/implants available, medications/allergies/relevant history reviewed, required imaging and test results available.  Sterile Technique Maximal sterile technique including full sterile barrier drape, hand hygiene, sterile gown, sterile gloves, mask, hair covering, sterile ultrasound probe cover (if used).  Procedure Description Area of catheter insertion was cleaned with chlorhexidine and draped in sterile fashion.  With real-time ultrasound guidance a central venous catheter was placed into the left internal jugular vein. Nonpulsatile blood flow and easy flushing noted in all ports.  The catheter was sutured in place and sterile dressing applied.  Complications/Tolerance None; patient tolerated the procedure well. Chest X-ray is ordered to verify placement for internal jugular or subclavian cannulation.     EBL Minimal  Specimen(s) None   Tessie Fass MSN, AGACNP-BC [facility]  02/05/2022, 12:23 PM

## 2022-02-06 ENCOUNTER — Inpatient Hospital Stay (HOSPITAL_COMMUNITY): Payer: 59

## 2022-02-06 DIAGNOSIS — J9622 Acute and chronic respiratory failure with hypercapnia: Secondary | ICD-10-CM

## 2022-02-06 DIAGNOSIS — J9621 Acute and chronic respiratory failure with hypoxia: Secondary | ICD-10-CM

## 2022-02-06 DIAGNOSIS — I6389 Other cerebral infarction: Secondary | ICD-10-CM

## 2022-02-06 LAB — SODIUM
Sodium: 152 mmol/L — ABNORMAL HIGH (ref 135–145)
Sodium: 154 mmol/L — ABNORMAL HIGH (ref 135–145)
Sodium: 157 mmol/L — ABNORMAL HIGH (ref 135–145)
Sodium: 160 mmol/L — ABNORMAL HIGH (ref 135–145)

## 2022-02-06 LAB — BASIC METABOLIC PANEL
Anion gap: 9 (ref 5–15)
BUN: 24 mg/dL — ABNORMAL HIGH (ref 6–20)
CO2: 19 mmol/L — ABNORMAL LOW (ref 22–32)
Calcium: 9.1 mg/dL (ref 8.9–10.3)
Chloride: 126 mmol/L — ABNORMAL HIGH (ref 98–111)
Creatinine, Ser: 1.58 mg/dL — ABNORMAL HIGH (ref 0.44–1.00)
GFR, Estimated: 40 mL/min — ABNORMAL LOW (ref >60)
Glucose, Bld: 168 mg/dL — ABNORMAL HIGH (ref 70–99)
Potassium: 4.1 mmol/L (ref 3.5–5.1)
Sodium: 154 mmol/L — ABNORMAL HIGH (ref 135–145)

## 2022-02-06 LAB — ECHOCARDIOGRAM COMPLETE
Area-P 1/2: 3.42 cm2
Calc EF: 64.8 %
Height: 64 in
S' Lateral: 2.4 cm
Single Plane A2C EF: 66.3 %
Single Plane A4C EF: 64.8 %
Weight: 2640 oz

## 2022-02-06 LAB — MAGNESIUM: Magnesium: 2 mg/dL (ref 1.7–2.4)

## 2022-02-06 LAB — CBC WITH DIFFERENTIAL/PLATELET
Abs Immature Granulocytes: 0.07 10*3/uL (ref 0.00–0.07)
Basophils Absolute: 0 10*3/uL (ref 0.0–0.1)
Basophils Relative: 0 %
Eosinophils Absolute: 0 10*3/uL (ref 0.0–0.5)
Eosinophils Relative: 0 %
HCT: 35.5 % — ABNORMAL LOW (ref 36.0–46.0)
Hemoglobin: 11.5 g/dL — ABNORMAL LOW (ref 12.0–15.0)
Immature Granulocytes: 1 %
Lymphocytes Relative: 7 %
Lymphs Abs: 0.9 10*3/uL (ref 0.7–4.0)
MCH: 28.2 pg (ref 26.0–34.0)
MCHC: 32.4 g/dL (ref 30.0–36.0)
MCV: 87 fL (ref 80.0–100.0)
Monocytes Absolute: 0.7 10*3/uL (ref 0.1–1.0)
Monocytes Relative: 5 %
Neutro Abs: 12.1 10*3/uL — ABNORMAL HIGH (ref 1.7–7.7)
Neutrophils Relative %: 87 %
Platelets: 198 10*3/uL (ref 150–400)
RBC: 4.08 MIL/uL (ref 3.87–5.11)
RDW: 15.5 % (ref 11.5–15.5)
WBC: 13.8 10*3/uL — ABNORMAL HIGH (ref 4.0–10.5)
nRBC: 0 % (ref 0.0–0.2)

## 2022-02-06 LAB — LIPID PANEL
Cholesterol: 146 mg/dL (ref 0–200)
HDL: 46 mg/dL (ref >40)
LDL Cholesterol: 64 mg/dL (ref 0–99)
Total CHOL/HDL Ratio: 3.2 RATIO
Triglycerides: 180 mg/dL — ABNORMAL HIGH (ref <150)
VLDL: 36 mg/dL (ref 0–40)

## 2022-02-06 LAB — TSH: TSH: 0.566 u[IU]/mL (ref 0.350–4.500)

## 2022-02-06 LAB — CBC
HCT: 35.4 % — ABNORMAL LOW (ref 36.0–46.0)
Hemoglobin: 12.1 g/dL (ref 12.0–15.0)
MCH: 28.9 pg (ref 26.0–34.0)
MCHC: 34.2 g/dL (ref 30.0–36.0)
MCV: 84.7 fL (ref 80.0–100.0)
Platelets: 204 10*3/uL (ref 150–400)
RBC: 4.18 MIL/uL (ref 3.87–5.11)
RDW: 15.1 % (ref 11.5–15.5)
WBC: 11.8 10*3/uL — ABNORMAL HIGH (ref 4.0–10.5)
nRBC: 0 % (ref 0.0–0.2)

## 2022-02-06 LAB — HIV ANTIBODY (ROUTINE TESTING W REFLEX): HIV Screen 4th Generation wRfx: NONREACTIVE

## 2022-02-06 LAB — HEMOGLOBIN A1C
Hgb A1c MFr Bld: 5.6 % (ref 4.8–5.6)
Mean Plasma Glucose: 114.02 mg/dL

## 2022-02-06 MED ORDER — SODIUM CHLORIDE 0.9 % IV SOLN: INTRAVENOUS | Status: DC

## 2022-02-06 MED ORDER — CARVEDILOL 12.5 MG PO TABS
12.5000 mg | ORAL_TABLET | Freq: Two times a day (BID) | ORAL | Status: DC
  Administered 2022-02-06 – 2022-02-07 (×2): 12.5 mg
  Filled 2022-02-06 (×2): qty 1

## 2022-02-06 MED ORDER — HYDRALAZINE HCL 50 MG PO TABS
50.0000 mg | ORAL_TABLET | Freq: Three times a day (TID) | ORAL | Status: DC
  Administered 2022-02-06 – 2022-02-07 (×3): 50 mg
  Filled 2022-02-06 (×3): qty 1

## 2022-02-06 MED ORDER — ATROPINE SULFATE 1 MG/10ML IJ SOSY
PREFILLED_SYRINGE | INTRAMUSCULAR | Status: AC
  Filled 2022-02-06: qty 10

## 2022-02-06 NOTE — Progress Notes (Signed)
Patient restless family at bedside. Increased sedation due to increased b/p

## 2022-02-06 NOTE — Progress Notes (Signed)
Pt transported to CT and back to 4N 31 on full vent support. No complications noted.

## 2022-02-06 NOTE — Progress Notes (Signed)
Pt transported to CT and back with no noted distress

## 2022-02-06 NOTE — Progress Notes (Addendum)
STROKE TEAM PROGRESS NOTE   SUBJECTIVE (INTERVAL HISTORY) Her daughter, Sarah Myers, is at the bedside.  Overall her condition is gradually improving. Patient has eyes open and responds to some commands. Repeat CTH shows stability of hemorrhage and no further midline shift. Patient completely off of Propofol, and some with weaning of Cleviprex.   OBJECTIVE CBC:  Recent Labs  Lab 02/05/22 0850 02/05/22 0907 02/05/22 1344 02/06/22 0432  WBC 15.4*  --   --  11.8*  NEUTROABS 12.7*  --   --   --   HGB 16.0*   < > 12.6 12.1  HCT 47.2*   < > 37.0 35.4*  MCV 84.1  --   --  84.7  PLT 247  --   --  204   < > = values in this interval not displayed.   Basic Metabolic Panel:  Recent Labs  Lab 02/05/22 0915 02/05/22 1003 02/05/22 1344 02/05/22 1441 02/06/22 0221 02/06/22 0432  NA 147*   < > 146*   < > 152* 154*  K 2.6*   < > 3.6  --   --  4.1  CL 110  --   --   --   --  126*  CO2 22  --   --   --   --  19*  GLUCOSE 188*  --   --   --   --  168*  BUN 16  --   --   --   --  24*  CREATININE 0.85  --   --   --   --  1.58*  CALCIUM 9.1  --   --   --   --  9.1   < > = values in this interval not displayed.   Lipid Panel:  Recent Labs  Lab 02/06/22 0432  CHOL 146  TRIG 180*  HDL 46  CHOLHDL 3.2  VLDL 36  LDLCALC 64   HgbA1c:  Recent Labs  Lab 02/06/22 0432  HGBA1C 5.6   Urine Drug Screen:  Recent Labs  Lab 02/05/22 1338  LABOPIA NONE DETECTED  COCAINSCRNUR NONE DETECTED  LABBENZ NONE DETECTED  AMPHETMU NONE DETECTED  THCU NONE DETECTED  LABBARB NONE DETECTED    Alcohol Level No results for input(s): ETH in the last 168 hours.  IMAGING past 24 hours CT HEAD WO CONTRAST ( )  Result Date: 02/06/2022 CLINICAL DATA:  Follow-up examination for hemorrhagic stroke. EXAM: CT HEAD WITHOUT CONTRAST TECHNIQUE: Contiguous axial images were obtained from the base of the skull through the vertex without intravenous contrast. RADIATION DOSE REDUCTION: This exam was performed  according to the departmental dose-optimization program which includes automated exposure control, adjustment of the mA and/or kV according to patient size and/or use of iterative reconstruction technique. COMPARISON:  CT from 02/05/2022. FINDINGS: Brain: Previously identified intraparenchymal hematoma centered at the right lentiform nucleus/external capsule again seen. Hemorrhage measures 4.2 x 3.2 x 5.3 cm in greatest dimensions (estimated volume 36 mL). This is mildly increased in size as compared to previous (previously measuring approximately 30 mL on prior CT when measured in a similar fashion). Surrounding vasogenic edema with regional mass effect. Partial effacement of the right lateral ventricle with up to 4 mm of right-to-left shift. Trace intraventricular hemorrhage seen within the occipital horns of both lateral ventricles, slightly increased as compared to previous. Unclear whether this reflects true intraventricular extension and/or redistribution. No hydrocephalus or trapping. Basilar cisterns remain patent. Empty sella noted. No new intracranial hemorrhage or large vessel territory  infarct. No extra-axial collection. No visible mass lesion. Vascular: No hyperdense vessel. Skull: Scalp soft tissues and calvarium within normal limits. Sinuses/Orbits: Globes and orbital soft tissues demonstrate no acute finding. 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 Paranasal sinuses and mastoid air cells otherwise remain largely clear. Endotracheal tube partially visualized. Other: None. IMPRESSION: 1. Slight interval increase in size of intraparenchymal hematoma centered at the right lentiform nucleus, now measuring 4.2 x 3.2 x 5.3 cm (estimated volume 36 mL). Surrounding vasogenic edema with regional mass effect with 4 mm of right-to-left shift. 2. Trace intraventricular hemorrhage within the occipital horns of both lateral ventricles, slightly increased as compared to previous. Unclear whether this  reflects true intraventricular extension and/or redistribution. No hydrocephalus or trapping. 3. No other new acute intracranial abnormality. Electronically Signed   By: Rise Mu M.D.   On: 02/06/2022 01:52   CT HEAD WO CONTRAST ( )  Result Date: 02/05/2022 CLINICAL DATA:  Stroke follow-up, assess stability of intracranial hemorrhage. Follow-up bleed. EXAM: CT HEAD WITHOUT CONTRAST TECHNIQUE: Contiguous axial images were obtained from the base of the skull through the vertex without intravenous contrast. RADIATION DOSE REDUCTION: This exam was performed according to the departmental dose-optimization program which includes automated exposure control, adjustment of the mA and/or kV according to patient size and/or use of iterative reconstruction technique. COMPARISON:  Head CT earlier today, proximally 6-1/2 hours ago. FINDINGS: Brain: Acute hemorrhage centered in the right lentiform nucleus is not significantly changed in size from prior exam allowing for differences in scan plane. This measures approximately 4.2 x 2.4 x 4.7 cm measured on series 3, image 13 and series 5, image 36. Similar degree of surrounding edema. There is similar effacement of the right lateral ventricle. No progressive ventriculomegaly. Right to left midline shift of 4 mm, also not significantly changed. No intraventricular subdural extension. No new sites of hemorrhage. Partially empty sella. Vascular: Persistent but decreased residual contrast within intracranial vasculature from CTA earlier today. Skull: No fracture or focal lesion. Sinuses/Orbits: Stable appearance of opacification of ethmoid air cells and possible chronic meningocele. Other: None. IMPRESSION: 1. Unchanged size of right lentiform nucleus hemorrhage with mild surrounding edema and mass effect. Unchanged 4 mm right to left midline shift. No new sites of hemorrhage. 2. 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 Electronically Signed    By: Narda Rutherford M.D.   On: 02/05/2022 18:27   CT Head Wo Contrast  Result Date: 02/05/2022 CLINICAL DATA:  Stroke, follow up eval extension of bleeding EXAM: CT HEAD WITHOUT CONTRAST TECHNIQUE: Contiguous axial images were obtained from the base of the skull through the vertex without intravenous contrast. RADIATION DOSE REDUCTION: This exam was performed according to the departmental dose-optimization program which includes automated exposure control, adjustment of the mA and/or kV according to patient size and/or use of iterative reconstruction technique. COMPARISON:  Same day CT head FINDINGS: Brain: When accounting for differences in technique, no substantial change acute intraparenchymal hemorrhage centered in the right lentiform nucleus. Similar surrounding edema and mass effect with approximately 4-5 mm of leftward midline shift. Similar effacement of the right lateral ventricle. No progressive ventriculomegaly. Intraparenchymal hemorrhage comes in close proximity to the right lateral ventricle without evidence of intraventricular extension at this time. Residual contrast within small vessels in the subarachnoid space, which limits evaluation for small volume subarachnoid hemorrhage. No evidence of acute large vascular territory infarct. Partially empty sella. Prominent Meckel's caves. Vascular: Residual contrast within vessels, presumably from same day CTA. Skull: No evidence of acute  fracture. Sinuses/Orbits: Probable defect in the factory fossa with suspected herniation of adjacent frontal lobes through the defect into posterior ethmoid air cells, suspicious for frontoethmoidal meningocele. Surrounding bony sclerosis. Other: No mastoid effusions IMPRESSION: 1. No substantial change in acute intraparenchymal hemorrhage centered in the right lentiform nucleus. 2. Similar surrounding edema and mass effect with approximately 4-5 mm of leftward midline shift. 3. 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 Constellation of findings can be seen with idiopathic intracranial hypertension. Follow-up MRI could further characterize. Electronically Signed   By: Feliberto Harts M.D.   On: 02/05/2022 10:01   DG CHEST PORT 1 VIEW  Result Date: 02/05/2022 CLINICAL DATA:  Central line placement EXAM: PORTABLE CHEST 1 VIEW COMPARISON:  Chest CT 02/10/2012 FINDINGS: Endotracheal tube overlies the trachea proximally 2.3 cm above the carina. Orogastric tube passes below the diaphragm, tip excluded by collimation. There is a left neck approach catheter with tip overlying the aortic arch or left brachiocephalic vein. There is no persistent left-sided SVC on prior chest CT. Mildly enlarged cardiac silhouette. Bibasilar opacities, unchanged. No pleural effusion. No visible pneumothorax. No acute osseous abnormality. IMPRESSION: Left neck approach central venous catheter tip overlies the aortic arch or left brachiocephalic vein. This is inconclusive for arterial versus venous placement radiographically. Recommend blood gas analysis and consideration of lateral chest radiograph or CT to confirm placement. Unchanged bibasilar opacities, favored to be atelectasis, aspiration or infection is possible. These results will be called to the ordering clinician or representative by the Radiologist Assistant, and communication documented in the PACS or [employer]. Electronically Signed   By: Caprice Renshaw M.D.   On: 02/05/2022 13:08   DG Chest Portable 1 View  Result Date: 02/05/2022 CLINICAL DATA:  Tube placement EXAM: PORTABLE CHEST 1 VIEW COMPARISON:  Chest x-ray 10/26/2019 FINDINGS: Endotracheal tube tip is 1.7 cm above the carina. Enteric tube tip is below the diaphragm. Cardiomediastinal silhouette is within normal limits. Likely mild subsegmental atelectasis at the lung bases. No pleural effusion or pneumothorax. IMPRESSION: Medical devices as  described. Minor basilar subsegmental atelectasis. Electronically Signed   By: Jannifer Hick M.D.   On: 02/05/2022 09:30     PHYSICAL EXAM Temp:  [98 F (36.7 C)-98.5 F (36.9 C)] 98.4 F (36.9 C) (02/20 0742) Pulse Rate:  [53-155] 56 (02/20 0804) Resp:  [16-32] 24 (02/20 0804) BP: (93-234)/(56-120) 129/66 (02/20 0750) SpO2:  [83 %-100 %] 99 % (02/20 0804) Arterial Line BP: (114-178)/(60-88) 131/67 (02/20 0742) FiO2 (%):  [40 %-100 %] 40 % (02/20 0804) Weight:  [74.8 kg] 74.8 kg (02/19 0913)  General - Well nourished, well developed, woman intubated, but with eyes open.  Cardiovascular - Regular rhythm and rate on telemetry.  Mental Status -  Patient with eyes open but orientation to time, place, and person unable to be assessed, as patient intubated  Cranial Nerves II - XII - II - Does not blink to threat bilaterally III, IV, VI - Dysconjugate gaze (upward gaze only). Turned head toward voice on the R, but not L VIII - Hearing appears to be intact bilaterally, as patient follows some commands. Opens and closes eyes on command, but does not maintain until instructed otherwise X - Gag and cough reflex intact  Motor Strength - Patient has some meaningful movements of RLE and withdraws to pain slowly as well as attempts to wiggle toes on command. No withdrawal to pain or meaningful movements in other extremities.   Plantar Reflexes:  Negative Babinski bilaterally.  Gait and Station - deferred.    ASSESSMENT/PLAN Sarah Myers is a 51 y.o. female with history of HTN presenting with AMS, L sided weakness, and slurred speech.   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 Intra-axial blood volume slightly greater than 33 mL. Mass effect with 4-5 mm leftward midline shift; superimposed chronic meningocele progressing since 2016.  Repeat  CTH at 0045 with 36 mL hemorrhagic area, and 0835 repeat unchanged. CTA head & neck No large vessel occlusion, intracranial aneurysm, or abnormal vasculature associated with hemorrhage. 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 CT repeat 2/20 stable hematoma and MLS 2D Echo LVEF 65-70%, small pericardial effusion present; trival MVR LDL 64 HgbA1c 5.6 VTE prophylaxis - SCDs No antithrombotic prior to admission, now on No antithrombotic.  Therapy recommendations:  Pending PT/OT/SLP eval when patient more stable Disposition:  Pending   Cerebral edema CT head showed midline shift 5 mm Repeat CT stable hematoma and midline shift On 3% saline 75->50 Na 141-> 146-> 152-> 154 Na monitoring  Hypertensive emergency SBP as high as 250 on presentation Home meds:  Coreg 25 mg BID, HCTZ 25 Unstable, on Cleviprex Continue Norvasc 10 mg  Due to inc Cr, d/c Losartan, dec Coreg to 12.5 mg BID, and intitiate should BP goal less than 160 Long-term BP goal normotensive  Elevated Cr/ AKI Cr inc from 0.85 to 1.58 D/c Losartan, Dec Coreg to 12.5 mg BID Continue Hypertonic solution; if patient too hypernatremic, will switch to NS  Respiratory failure Intubated in the ED for airway protection Not a candidate for extubation today Plan for extubation tomorrow if able  Other Stroke Risk Factors Family hx stroke (Maternal Uncle)  Other Active Problems Leukocytosis WBC 15.4-11.8, UA negative  Hospital day # 1  Lamar Sprinkles, MD Stroke Neurology- Neuro Psych Resident 02/06/2022 8:25 AM  ATTENDING NOTE: I reviewed above note and agree with the assessment and plan. Pt was seen and examined.   51 year old female with history of hypertension admitted for left-sided weakness and slurred speech.  CT showed right large BG ICH with midline shift 4 to 5 mm.  BP high on presentation, intubated for airway protection.  CT head and neck showed chronic left PCA  occlusion, no aneurysm or AVM.  EF 65 to 70%, UDS negative, UA negative, LDL 64, A1c 5.6.  WBC 15.4-11.8.  Creatinine 0.85-1.58.  On exam, patient still intubated on sedation, eyes open to voice, however, not quite following commands. With eye opening, eyes in mid position, not blinking to visual threat, doll's eyes present, not tracking, PERRL. Corneal reflex , gag and cough present. Breathing over the vent.  Facial symmetry not able to test due to ET tube.  Tongue protrusion not cooperative. On pain stimulation, slight withdrawal of right upper and lower extremity, no movement on the left.  Bilaterally no babinski. Sensation, coordination and gait not tested.  Etiology for patient right large BG ICH likely due to hypertensive emergency.  Currently on Cleviprex IV, and also p.o. medication with Norvasc, hydralazine and Coreg.  Taper off Cleviprex as able.  On 3% saline for cerebral edema, sodium goal 1 50-1 55.  Vent management per CCM.  Elevated creatinine, will DC losartan and avoid home HCTZ.  Close neuro monitoring, stat CT if neuro changes.  Otherwise well check MRI in a couple of days.  For detailed assessment and plan, please refer to above as I have made changes wherever appropriate.   This patient is critically ill due to large right ICH, cerebral edema, hypertensive emergency, respiratory failure, AKI and at significant risk of neurological worsening, death form brain herniation, hypertensive encephalopathy, renal failure, seizure. This patient's care requires constant monitoring of vital signs, hemodynamics, respiratory and cardiac monitoring, review of multiple databases, neurological assessment, discussion with family, other specialists and medical decision making of high complexity. I spent 40 minutes of neurocritical care time in the care of this patient. I had long discussion with daughter at bedside, updated pt current condition, treatment plan and potential prognosis, and answered all the  questions.  She expressed understanding and appreciation.    Marvel Plan, MD PhD Stroke Neurology 02/06/2022 3:09 PM    To contact Stroke Continuity provider, please refer to [email or web address]. After hours, contact General Neurology

## 2022-02-06 NOTE — Plan of Care (Signed)
Reviewed and educations on patient care plan with daughter at the bedside

## 2022-02-06 NOTE — TOC CAGE-AID Note (Signed)
Transition of Care [facility]) - CAGE-AID Screening   Patient Details  Name: Sarah Myers MRN: [id number] Date of Birth: [birth year]  Transition of [facility]) CM/SW Contact:    Andranik Jeune C Tarpley-Carter, LCSWA Phone Number: 02/06/2022, 9:15 AM   Clinical Narrative: Pt is unable to participate in Cage Aid. Pt is not currently in room.  CSW will assess at a better time.  Krisa Blattner Tarpley-Carter, MSW, LCSW-A Pronouns:  She/Her/Hers [place] Transitions of Care Clinical Social Worker Direct Number:  [phone] [email or web address]  CAGE-AID Screening: Substance Abuse Screening unable to be completed due to: : Patient unable to participate             Substance Abuse Education Offered: No

## 2022-02-06 NOTE — Progress Notes (Signed)
Brief neuro Update:  Got a repeat CTH for some worsening of her mentation. She had increase in her Cleviprex titration along with uptitration of Propofol to 11mcg/Kg/min for elevated BP. Her Blood pressure was stable and well controlled so we backed down on sedation and propofol down to 48ml/kg/min. She had persistent sedation and thus repeat CTH was ordered which does show a small increase in the size of ICH to 68ml. Midline shift is still unchanged at 54mm. She has trace intraventricular extension within BL occipital horns that is slightly increased as compared to previous.  Neuro exam after pausing sedation for a few mins with dysconjugate gaze, pinpoint pupils, intact cough, corneals intact BL, bits down on the tongue depressor so unable to elicit a gag. She localizes in RUE, she withdraws RLE. No movement in LUE and LLE.  Later in the exam, she spontaneously opening her eyes and to stimulation. She does not follow any commands.  Recs: - I requested Neurosurgery team to review the pictures. Per my discussion with Selena Batten with Neurosurgery, no plans for any surgical intervention at this time and I requested that they document in the chart. I will leave it to their discretion if they would like to evaluate the patient themselves. - Will continue Hypertonic saline at this time. - will get repeat CTH in 6 hours.   Erick Blinks [facility] Pager Number [phone]

## 2022-02-06 NOTE — Progress Notes (Signed)
°  Echocardiogram 2D Echocardiogram has been performed.  Augustine Radar 02/06/2022, 10:19 AM

## 2022-02-06 NOTE — Progress Notes (Addendum)
NAME:  Sarah Myers, MRN:  [id number], DOB:  November 25, 1971, LOS: 1 ADMISSION DATE:  02/05/2022, CONSULTATION DATE:  02/05/22 REFERRING MD:  Quinn Axe - neuro , CHIEF COMPLAINT:  Mechanically Ventilated  // ICH    History of Present Illness:  51 yo F PMH HTN presented to [facility] 2/19 with CC AMS. LKW 2100 2/18, at 0600 2/19 found to have L sided weakness, slurred speech. Evaluated as code stroke and found to have R side ICH with mass effect (brain compression) present on admission.  Intubated in ED for airway protection. Started on 3% saline, cleviprex. Admitted to Neuro team  PCCM consulted in this setting to assist with medical management   Na 154 ( Goal 150-155) , K 4.1, Creatinine 1.58, ( 0.85) BUN 24,( 16) Chloride 126  Serum CBC- WBC 11.8 ( 15.4)  hgb 12.1 ( 16.0>> 13.3>>12.6>>12.1) HCT  35.4 Net + 3.1  L   Pertinent  Medical History   HTN  Significant Hospital Events: Including procedures, antibiotic start and stop dates in addition to other pertinent events   2/19 admitted to neuro with acute R ICH. Intubated for airway protection. Started on 3% and  cleviprex. PCCM consult. NSGY consult  02/06/2022 For repeat CT Head >> Overnight CT showed increase in size of bleed to 36 ml , but unchanged midline shift at 4 mm . Repeating CT head again 2/20 am. Neurosurgery is aware  Interim History / Subjective:   Remains Intubated and sedated.  On 3% saline , cleviprex and propofol/ fentanyl. Overnight patient has started becoming bradycardic with any stimulation.  HGB has dropped from 16 on 2/19 to 12.1 on 2/20.  + 2900 cc's with minimal UO Bump in Creatinine from 0.85- 1.58, BUN from 16 to 24.  She is afebrile , WBC has dropped from 15.4 to 11.8 Repeat CT head 2/20 am.  No CXR this am  Following some simple commands + 3 L last 24 hours  479 cc UO last 24 hours    Objective   Blood pressure 129/66, pulse (!) 56, temperature 98.4 F (36.9 C), temperature source Axillary, resp. rate  (!) 24, height 5\' 4"  (1.626 m), weight 74.8 kg, SpO2 99 %.    Vent Mode: PRVC FiO2 (%):  [40 %-100 %] 40 % Set Rate:  [18 bmp-24 bmp] 24 bmp Vt Set:  [430 mL] 430 mL PEEP:  [5 cmH20] 5 cmH20 Plateau Pressure:  [17 cmH20-20 cmH20] 20 cmH20   Intake/Output Summary (Last 24 hours) at 02/06/2022 [id number] Last data filed at 02/06/2022 0800 Gross per 24 hour  Intake 3455.64 ml  Output 494 ml  Net 2961.64 ml   Filed Weights   02/05/22 0913  Weight: 74.8 kg    Examination: General: Critically ill appearing middle aged F intubated sedated NAD  HENT: NCAT pink mm ETT secure  Lungs: Bilateral chest excursion, coarse throughout, diminished per bases Cardiovascular: rrr s1s2 no rgm , HR 57 at present, SR per tele, + upper extremity edema Abdomen: soft ndnt + bowel sounds , Body mass index is 28.32 kg/m. Extremities: no obvious deformities,  no cyanosis or clubbing  Neuro: Sedated. Pinpoint pupils. Dysconjugate gaze, upward and to right ,  wiggled toes to command. She does open eyes to stimulation.  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 -- " Brain Compression" -- present on admission  Incidental finding:  proximal L PCA severe stenosis vs occlusion  Increase in bleed overnight , but no increase in shift Neuro status decline overnight  P  -3% saline goal Na 150-155 -cleviprex for BP control -continue steroids -Repeat imaging per neuro / neurosurgery - Neurosurgery following  Hypertensive emergency Underlying uncontrolled HTN Episodes of bradycardia overnight 2/20 -home coreg and HCTZ P -with acute ICH, SBP goal 130- 150  -cleviprex gtt, wean as able   - Will drop  Coreg  to 12.5 mg BID, now due to bradycardia, discontinue Losartan, added Hydralazine 50 MG Q 8 per tube  AKI Creatinine increase from 0.85- 1.58, BUN from 16 to 24. Overnight Suspect baseline BP was much higher, and renal function may reflect controlled BP Poor UO Plan Trend BMET Maintain renal perfusion  Renal dosing of medications   Anemia HGB drop from HGB has dropped from 16 on 2/19 to 12.1 on 2/20.  ? If hemodilutional ( + 3 L last 24 hours )   Plan Trend CBC Monitor for obvious bleeding CT Head done to eval for increase bleed Transfuse for HGB < 7  Hypokalemia -replace -follow CMP - Check Mag and ensure > 2  Leukocytosis - suspect reactive 2/2 ICH Down Trending 2/20 P -monitor off abx    Best Practice (right click and "Reselect all SmartList Selections" daily)   Diet/type: tubefeeds DVT prophylaxis: SCD GI prophylaxis: PPI Lines: N/A Foley:  Yes, and it is still needed Code Status:  full code Last date of multidisciplinary goals of care discussion [per primary]  Labs   CBC: Recent Labs  Lab 02/05/22 0850 02/05/22 0907 02/05/22 1008 02/05/22 1344 02/06/22 0432  WBC 15.4*  --   --   --  11.8*  NEUTROABS 12.7*  --   --   --   --   HGB 16.0* 16.0* 13.3 12.6 12.1  HCT 47.2* 47.0* 39.0 37.0 35.4*  MCV 84.1  --   --   --  84.7  PLT 247  --   --   --  0000000    Basic Metabolic Panel: Recent Labs  Lab 02/05/22 0907 02/05/22 0915 02/05/22 1003 02/05/22 1008 02/05/22 1344 02/05/22 1441 02/05/22 2012 02/06/22 0221 02/06/22 0432  NA 141 147*   < > 141 146* 143 148* 152* 154*  K 2.8* 2.6*  --  2.9* 3.6  --   --   --  4.1  CL 106 110  --   --   --   --   --   --  126*  CO2  --  22  --   --   --   --   --   --  19*  GLUCOSE 180* 188*  --   --   --   --   --   --  168*  BUN 19 16  --   --   --   --   --   --  24*  CREATININE 0.80 0.85  --   --   --   --   --   --  1.58*  CALCIUM  --  9.1  --   --   --   --   --   --  9.1   < > =  values  in this interval not displayed.   GFR: Estimated Creatinine Clearance: 42.6 mL/min (A) (by C-G formula based on SCr of 1.58 mg/dL (H)). Recent Labs  Lab 02/05/22 0850 02/06/22 0432  WBC 15.4* 11.8*    Liver Function Tests: Recent Labs  Lab 02/05/22 0915  AST 33  ALT 26  ALKPHOS 65  BILITOT 0.7  PROT 7.5  ALBUMIN 3.9   No results for input(s): LIPASE, AMYLASE in the last 168 hours. No results for input(s): AMMONIA in the last 168 hours.  ABG    Component Value Date/Time   PHART 7.436 02/05/2022 1344   PCO2ART 38.5 02/05/2022 1344   PO2ART 35 (LL) 02/05/2022 1344   HCO3 25.8 02/05/2022 1344   TCO2 27 02/05/2022 1344   O2SAT 69 02/05/2022 1344     Coagulation Profile: Recent Labs  Lab 02/05/22 0915  INR 1.2    Cardiac Enzymes: No results for input(s): CKTOTAL, CKMB, CKMBINDEX, TROPONINI in the last 168 hours.  HbA1C: Hemoglobin A1C  Date/Time Value Ref Range Status  09/26/2016 11:38 AM 5.3  Final   Hgb A1c MFr Bld  Date/Time Value Ref Range Status  02/06/2022 04:32 AM 5.6 4.8 - 5.6 % Final    Comment:    (NOTE) Pre diabetes:          5.7%-6.4%  Diabetes:              >6.4%  Glycemic control for   <7.0% adults with diabetes     CBG: Recent Labs  Lab 02/05/22 0737  GLUCAP 162*    Home Medications  Prior to Admission medications   Medication Sig Start Date End Date Taking? Authorizing Provider  APPLE CIDER VINEGAR PO Take 1 capsule by mouth daily. Patient not taking: Reported on 09/19/2021    [provider]  carvedilol (COREG) 25 MG tablet TAKE 1 TABLET BY MOUTH TWICE DAILY WITH A MEAL 01/13/22   [name], MD  ferrous sulfate 324 (65 Fe) MG TBEC Take 1 tablet (325 mg total) by mouth every other day. Patient not taking: Reported on 09/19/2021 06/30/20   Nicolette Bang, MD  hydrochlorothiazide (HYDRODIURIL) 25 MG tablet Take 1 tablet by mouth once daily 12/14/21   [name], MD  hydrOXYzine (ATARAX/VISTARIL) 10 MG  tablet Take 1 tablet (10 mg total) by mouth 3 (three) times daily as needed. Patient not taking: Reported on 09/19/2021 06/29/20   Nicolette Bang, MD  Vitamin D, Ergocalciferol, (DRISDOL) 1.25 MG (50000 UNIT) CAPS capsule Take 1 capsule (50,000 Units total) by mouth every 7 (seven) days. Patient not taking: Reported on 09/19/2021 06/30/20   Nicolette Bang, MD     Critical care time: 45 min      CRITICAL CARE Performed by: Magdalen Spatz   Total critical care time: 45 minutes  Critical care time was exclusive of separately billable procedures and treating other patients. Critical care was necessary to treat or prevent imminent or life-threatening deterioration.  Critical care was time spent personally by me on the following activities: development of treatment plan with patient and/or surrogate as well as nursing, discussions with consultants, evaluation of patient's response to treatment, examination of patient, obtaining history from patient or surrogate, ordering and performing treatments and interventions, ordering and review of laboratory studies, ordering and review of radiographic studies, pulse oximetry and re-evaluation of patient's condition.  Magdalen Spatz, MSN, AGACNP-BC [place] See Amion for personal pager PCCM on call pager [phone]  02/06/2022, 8:14 AM

## 2022-02-06 NOTE — Progress Notes (Signed)
Spoke with Dr. Derry Lory about pt's poor neuro exam even after decreasing sedation.  STAT CTH ordered and completed.  Also informed him of pt's decreased U/O throughout shift.  No new orders at this time.

## 2022-02-07 ENCOUNTER — Inpatient Hospital Stay (HOSPITAL_COMMUNITY): Payer: 59

## 2022-02-07 DIAGNOSIS — L8990 Pressure ulcer of unspecified site, unspecified stage: Secondary | ICD-10-CM | POA: Insufficient documentation

## 2022-02-07 LAB — COMPREHENSIVE METABOLIC PANEL
ALT: 31 U/L (ref 0–44)
AST: 31 U/L (ref 15–41)
Albumin: 3.1 g/dL — ABNORMAL LOW (ref 3.5–5.0)
Alkaline Phosphatase: 48 U/L (ref 38–126)
BUN: 29 mg/dL — ABNORMAL HIGH (ref 6–20)
CO2: 19 mmol/L — ABNORMAL LOW (ref 22–32)
Calcium: 8.8 mg/dL — ABNORMAL LOW (ref 8.9–10.3)
Chloride: >130 mmol/L (ref 98–111)
Creatinine, Ser: 1.29 mg/dL — ABNORMAL HIGH (ref 0.44–1.00)
GFR, Estimated: 51 mL/min — ABNORMAL LOW (ref >60)
Glucose, Bld: 151 mg/dL — ABNORMAL HIGH (ref 70–99)
Potassium: 4 mmol/L (ref 3.5–5.1)
Sodium: 158 mmol/L — ABNORMAL HIGH (ref 135–145)
Total Bilirubin: 0.4 mg/dL (ref 0.3–1.2)
Total Protein: 6.3 g/dL — ABNORMAL LOW (ref 6.5–8.1)

## 2022-02-07 LAB — CBC WITH DIFFERENTIAL/PLATELET
Abs Immature Granulocytes: 0.06 K/uL (ref 0.00–0.07)
Basophils Absolute: 0 K/uL (ref 0.0–0.1)
Basophils Relative: 0 %
Eosinophils Absolute: 0 K/uL (ref 0.0–0.5)
Eosinophils Relative: 0 %
HCT: 35 % — ABNORMAL LOW (ref 36.0–46.0)
Hemoglobin: 11.3 g/dL — ABNORMAL LOW (ref 12.0–15.0)
Immature Granulocytes: 0 %
Lymphocytes Relative: 7 %
Lymphs Abs: 0.9 K/uL (ref 0.7–4.0)
MCH: 28.9 pg (ref 26.0–34.0)
MCHC: 32.3 g/dL (ref 30.0–36.0)
MCV: 89.5 fL (ref 80.0–100.0)
Monocytes Absolute: 0.4 K/uL (ref 0.1–1.0)
Monocytes Relative: 3 %
Neutro Abs: 12.7 K/uL — ABNORMAL HIGH (ref 1.7–7.7)
Neutrophils Relative %: 90 %
Platelets: 200 K/uL (ref 150–400)
RBC: 3.91 MIL/uL (ref 3.87–5.11)
RDW: 16.4 % — ABNORMAL HIGH (ref 11.5–15.5)
WBC: 14.1 K/uL — ABNORMAL HIGH (ref 4.0–10.5)
nRBC: 0 % (ref 0.0–0.2)

## 2022-02-07 LAB — GLUCOSE, CAPILLARY
Glucose-Capillary: 121 mg/dL — ABNORMAL HIGH (ref 70–99)
Glucose-Capillary: 139 mg/dL — ABNORMAL HIGH (ref 70–99)
Glucose-Capillary: 153 mg/dL — ABNORMAL HIGH (ref 70–99)

## 2022-02-07 LAB — MAGNESIUM
Magnesium: 2.2 mg/dL (ref 1.7–2.4)
Magnesium: 2 mg/dL (ref 1.7–2.4)

## 2022-02-07 LAB — BLOOD GAS, VENOUS
Acid-base deficit: 6.1 mmol/L — ABNORMAL HIGH (ref 0.0–2.0)
Bicarbonate: 20.2 mmol/L (ref 20.0–28.0)
FIO2: 40 %
O2 Saturation: 67.5 %
Patient temperature: 36.9
pCO2, Ven: 42 mmHg — ABNORMAL LOW (ref 44–60)
pH, Ven: 7.29 (ref 7.25–7.43)
pO2, Ven: 38 mmHg (ref 32–45)

## 2022-02-07 LAB — PHOSPHORUS
Phosphorus: 2.3 mg/dL — ABNORMAL LOW (ref 2.5–4.6)
Phosphorus: 3.2 mg/dL (ref 2.5–4.6)

## 2022-02-07 LAB — SODIUM
Sodium: 159 mmol/L — ABNORMAL HIGH (ref 135–145)
Sodium: 157 mmol/L — ABNORMAL HIGH (ref 135–145)
Sodium: 155 mmol/L — ABNORMAL HIGH (ref 135–145)

## 2022-02-07 MED ORDER — PROSOURCE TF PO LIQD
90.0000 mL | Freq: Three times a day (TID) | ORAL | Status: DC
  Administered 2022-02-07 – 2022-02-08 (×4): 90 mL
  Filled 2022-02-07 (×2): qty 90

## 2022-02-07 MED ORDER — ALBUMIN HUMAN 5 % IV SOLN
12.5000 g | Freq: Once | INTRAVENOUS | Status: AC
  Administered 2022-02-07: 12.5 g via INTRAVENOUS
  Filled 2022-02-07: qty 250

## 2022-02-07 MED ORDER — FREE WATER
200.0000 mL | Status: DC
  Administered 2022-02-07: 200 mL

## 2022-02-07 MED ORDER — HYDRALAZINE HCL 50 MG PO TABS
100.0000 mg | ORAL_TABLET | Freq: Three times a day (TID) | ORAL | Status: DC
  Administered 2022-02-07 – 2022-02-09 (×6): 100 mg
  Filled 2022-02-07 (×6): qty 2

## 2022-02-07 MED ORDER — ADULT MULTIVITAMIN W/MINERALS CH
1.0000 | ORAL_TABLET | Freq: Every day | ORAL | Status: DC
  Administered 2022-02-07 – 2022-03-21 (×43): 1
  Filled 2022-02-07 (×44): qty 1

## 2022-02-07 MED ORDER — CLONIDINE HCL 0.1 MG PO TABS
0.1000 mg | ORAL_TABLET | Freq: Three times a day (TID) | ORAL | Status: DC
  Administered 2022-02-07 – 2022-02-15 (×24): 0.1 mg via NASOGASTRIC
  Filled 2022-02-07 (×24): qty 1

## 2022-02-07 MED ORDER — CARVEDILOL 3.125 MG PO TABS
6.2500 mg | ORAL_TABLET | Freq: Two times a day (BID) | ORAL | Status: DC
  Administered 2022-02-07 – 2022-02-08 (×3): 6.25 mg
  Filled 2022-02-07 (×3): qty 2

## 2022-02-07 MED ORDER — VITAL 1.5 CAL PO LIQD
1000.0000 mL | ORAL | Status: DC
Start: 2022-02-07 — End: 2022-02-08
  Administered 2022-02-07: 1000 mL

## 2022-02-07 MED ORDER — SODIUM CHLORIDE 0.9 % IV BOLUS
1000.0000 mL | Freq: Once | INTRAVENOUS | Status: AC
  Administered 2022-02-07: 1000 mL via INTRAVENOUS

## 2022-02-07 NOTE — Progress Notes (Addendum)
NAME:  ZURY FAZZINO, MRN:  [id number], DOB:  March 28, 1971, LOS: 2 ADMISSION DATE:  02/05/2022, CONSULTATION DATE:  02/05/22 REFERRING MD:  Selina Cooley - neuro , CHIEF COMPLAINT:  Mechanically Ventilated  // ICH    History of Present Illness:  51 yo F PMH HTN presented to [facility] 2/19 with CC AMS. LKW 2100 2/18, at 0600 2/19 found to have L sided weakness, slurred speech. Evaluated as code stroke and found to have R side ICH with mass effect (brain compression) present on admission.  Intubated in ED for airway protection. Started on 3% saline, cleviprex. Admitted to Neuro team  PCCM consulted in this setting to assist with medical management   Na 154 ( Goal 150-155) , K 4.1, Creatinine 1.58, ( 0.85) BUN 24,( 16) Chloride 126  Serum CBC- WBC 11.8 ( 15.4)  hgb 12.1 ( 16.0>> 13.3>>12.6>>12.1) HCT  35.4 Net + 3.1  L   Pertinent  Medical History  HTN  Significant Hospital Events: Including procedures, antibiotic start and stop dates in addition to other pertinent events   2/19 admitted to neuro with acute R ICH. Intubated for airway protection. Started on 3% and  cleviprex. PCCM consult. NSGY consult  02/06/2022 For repeat CT Head >> Overnight CT showed increase in size of bleed to 36 ml , but unchanged midline shift at 4 mm . Repeating CT head again 2/20 am. Neurosurgery is aware  Interim History / Subjective:  Intubated on mech vent PRVC Sedated w/ prop/fent Wiggle toes RLE and closes eyes to command Off 3% saline overnight and received free water flushes due to Na 158 UOP only 438 mL last 24 hours  Objective   Blood pressure 121/84, pulse 66, temperature 98.1 F (36.7 C), temperature source Oral, resp. rate (!) 25, height 5\' 4"  (1.626 m), weight 74.8 kg, SpO2 96 %.    Vent Mode: PRVC FiO2 (%):  [40 %] 40 % Set Rate:  [24 bmp] 24 bmp Vt Set:  [430 mL] 430 mL PEEP:  [5 cmH20] 5 cmH20 Plateau Pressure:  [18 cmH20-20 cmH20] 20 cmH20   Intake/Output Summary (Last 24 hours) at 02/07/2022  02/09/2022 Last data filed at 02/07/2022 0600 Gross per 24 hour  Intake 2490.04 ml  Output 438 ml  Net 2052.04 ml    Filed Weights   02/05/22 0913  Weight: 74.8 kg    Examination: General: critically ill appearing on mech vent HEENT: MM pink/moist; ETT in place Neuro: Sedated w/ prop/fent; Wiggle toes RLE and closes eyes to command; pupils pin point CV: s1s2, no m/r/g PULM:  dim clear BS bilaterally; on mech vent PRVC GI: soft, bsx4 active  Extremities: warm/dry, no edema  Skin: no rashes or lesions appreciated  Resolved Hospital Problem list     Assessment & Plan:   Acute respiratory insufficiency requiring intubation due to intracranial process  P: -cont mech vent prvc 6-8 cc/kg -wean fio2 for sats >92% -vap prevention in place -abg/cxr prn -sbt/sat when stable  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 -- " Brain Compression" -- present on admission  Incidental finding:  proximal L PCA severe stenosis vs occlusion  Increase in bleed overnight , but no increase in shift Neuro status decline overnight  P: -neurosurgery following; appreciate recs -free water started for goal Na 150-155; 3% saline as needed - cont cleviprex for SBP goal 130-150 -continue steroids -frequent neuro checks  Hypertensive emergency Underlying uncontrolled HTN Episodes of bradycardia overnight 2/20: coreg dose decreased -  home coreg and HCTZ P: -cont cleviprex for SBP goal 130- 150  -increasing hydralazine -cont coreg and norvasc  AKI Creatinine increase from 0.85- 1.58, BUN from 16 to 24. Overnight Suspect baseline BP was much higher, and renal function may reflect controlled BP Poor UO P: -Trend BMP / urinary output -Replace electrolytes as indicated -Avoid nephrotoxic agents, ensure adequate renal perfusion  Anemia -HGB drop from HGB has dropped from 16 on 2/19 to 12.1 on 2/20.  ? If hemodilutional ( + 3 L last 24 hours )  P: -trend  cbc -Transfuse for HGB < 7  Hypokalemia P: -trend bmp/mag -replete as needed  Leukocytosis - suspect reactive 2/2 ICH Down Trending 2/20 P: -trend wbc/fever curve   Best Practice (right click and "Reselect all SmartList Selections" daily)   Diet/type: tubefeeds DVT prophylaxis: SCD GI prophylaxis: PPI Lines: N/A Foley:  Yes, and it is still needed Code Status:  full code Last date of multidisciplinary goals of care discussion [per primary]  Critical care time: 35 min     JD Anselm Lis [facility] 02/07/2022, 8:47 AM  Please see [email or web address] for pager details.  From 7A-7P if no response, please call [phone]. After hours, please call ELink [phone].

## 2022-02-07 NOTE — Progress Notes (Signed)
eLink Physician-Brief Progress Note Patient Name: Sarah Myers DOB: 08/16/1971 MRN: [id number]   Date of Service  02/07/2022  HPI/Events of Note  Na+ = 158 and Cl+ > 130. 3% NaCl D/Ced yesterday. Goal Na+ = 150-155.  eICU Interventions  Plan: Decrease 0.9 NaCl IV infusion to 10 mL/hour. Free wate 200 mL per tube Q 4 hours.      Intervention Category Major Interventions: Electrolyte abnormality - evaluation and management  Khup Sapia [place] 02/07/2022, 6:12 AM

## 2022-02-07 NOTE — Progress Notes (Signed)
Date and time results received: 02/07/22 0527   Critical Value: Cl>130  Name of Provider Notified: E-link

## 2022-02-07 NOTE — Progress Notes (Signed)
°  Transition of Care [facility]) Screening Note   Patient Details  Name: Sarah Myers Date of Birth: 1971/09/12   Transition of Care [facility]) CM/SW Contact:    Glennon Mac, RN Phone Number: 02/07/2022, 2:48 PM    Transition of Care Department [facility]) has reviewed patient and no TOC needs have been identified at this time. We will continue to monitor patient advancement through interdisciplinary progression rounds. If new patient transition needs arise, please place a TOC consult.  Quintella Baton, RN, BSN  Trauma/Neuro ICU Case Manager [phone]

## 2022-02-07 NOTE — Progress Notes (Addendum)
STROKE TEAM PROGRESS NOTE   SUBJECTIVE (INTERVAL HISTORY) Her daughter, [name], is at the bedside.  Overall her condition is unchanged. Patient opens eyes to voice and painful stimuli. Patient became agitated last night, and Propofol was restarted. Blood pressure has been stable and patient's hypernatremia persisted beyond the 150-155 range, prompting discontinuation of hypertonic saline and switch to NS.  Unable to determine patient's level of progress 2/2 sedation, so will repeat imaging today.  OBJECTIVE CBC:  Recent Labs  Lab 02/06/22 1205 02/07/22 0320  WBC 13.8* 14.1*  NEUTROABS 12.1* 12.7*  HGB 11.5* 11.3*  HCT 35.5* 35.0*  MCV 87.0 89.5  PLT 198 [id number]    Basic Metabolic Panel:  Recent Labs  Lab 02/06/22 0432 02/06/22 0804 02/06/22 1205 02/06/22 1439 02/06/22 1958 02/07/22 0320  NA 154*   < >  --    < > 160* 158*  K 4.1  --   --   --   --  4.0  CL 126*  --   --   --   --  >130*  CO2 19*  --   --   --   --  19*  GLUCOSE 168*  --   --   --   --  151*  BUN 24*  --   --   --   --  29*  CREATININE 1.58*  --   --   --   --  1.29*  CALCIUM 9.1  --   --   --   --  8.8*  MG  --   --  2.0  --   --  2.2   < > = values in this interval not displayed.    Lipid Panel:  Recent Labs  Lab 02/06/22 0432  CHOL 146  TRIG 180*  HDL 46  CHOLHDL 3.2  VLDL 36  LDLCALC 64    HgbA1c:  Recent Labs  Lab 02/06/22 0432  HGBA1C 5.6    Urine Drug Screen:  Recent Labs  Lab 02/05/22 1338  LABOPIA NONE DETECTED  COCAINSCRNUR NONE DETECTED  LABBENZ NONE DETECTED  AMPHETMU NONE DETECTED  THCU NONE DETECTED  LABBARB NONE DETECTED     Alcohol Level No results for input(s): ETH in the last 168 hours.  IMAGING past 24 hours CT HEAD WO CONTRAST (5MM)  Result Date: 02/06/2022 CLINICAL DATA:  Provided history: Stroke, hemorrhagic stroke, follow-up. EXAM: CT HEAD WITHOUT CONTRAST TECHNIQUE: Contiguous axial images were obtained from the base of the skull through the vertex  without intravenous contrast. RADIATION DOSE REDUCTION: This exam was performed according to the departmental dose-optimization program which includes automated exposure control, adjustment of the mA and/or kV according to patient size and/or use of iterative reconstruction technique. COMPARISON:  Prior head CT examinations 02/06/2022 and earlier. FINDINGS: Brain: An acute parenchymal hemorrhage centered within the right lentiform nucleus and also extending into the anterior right temporal lobe, has not significantly changed in size as compared to the head CT performed earlier today at 1:05 a.m. As before, the dominant component of the hemorrhage measures 4.6 x 3.6 x 4.6 cm (remeasured on prior). Mild surrounding edema is also unchanged. Regional mass effect with partial effacement of the right lateral ventricle is unchanged. 4 mm midline shift measured at the level of the septum pellucidum is unchanged. Stable small volume intraventricular hemorrhage within the occipital horns of both lateral ventricles. No evidence of hydrocephalus. The basal cisterns remain patent. Mild patchy and ill-defined hypoattenuation within the cerebral white matter elsewhere,  nonspecific but compatible with chronic small vessel ischemic disease. Partially empty sella turcica. No appreciable intracranial mass. Vascular: No hyperdense vessel. Skull: Normal. Negative for fracture or focal lesion. Sinuses/Orbits: Visualized orbits show no acute finding. Frothy secretions within the right sphenoid sinus. Trace mucosal thickening within the left sphenoid sinus. 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 IMPRESSION: Unchanged 4.6 x 3.6 x 4.6 cm acute parenchymal hemorrhage centered within the right lentiform nucleus, also extending into the anterior right temporal lobe. Stable surrounding edema and regional mass effect with partial effacement of the fourth ventricle and 4 mm leftward midline shift. Unchanged small-volume  intraventricular hemorrhage within the occipital horns of both lateral ventricles. It is unclear if this reflects true intraventricular extension or redistribution. No evidence of hydrocephalus or ventricular entrapment. Electronically Signed   By: Kellie Simmering D.O.   On: 02/06/2022 09:05   ECHOCARDIOGRAM COMPLETE  Result Date: 02/06/2022    ECHOCARDIOGRAM REPORT   [name] Date of Exam: 02/06/2022 Medical Rec #:  [id number]           Height:       64.0 in Accession #:    [id number]          Weight:       165.0 lb Date of Birth:  October 29, 1971            BSA:          1.803 m Patient Age:    51 years            BP:           110/74 mmHg Patient Gender: F                   HR:           87 bpm. Exam Location:  Inpatient Procedure: 2D Echo, Cardiac Doppler and Color Doppler Indications:    Stroke I63.9  History:        Patient has prior history of Echocardiogram examinations, most                 recent 11/03/2019. Risk Factors:Hypertension.  Sonographer:    Bernadene Person RDCS Referring Phys: [id number] GRACE E BOWSER  Sonographer Comments: Echo performed with patient supine and on artificial respirator. IMPRESSIONS  1. Left ventricular ejection fraction, by estimation, is 65 to 70%. The left ventricle has normal function. The left ventricle has no regional wall motion abnormalities. Left ventricular diastolic parameters were normal.  2. Right ventricular systolic function is normal. The right ventricular size is normal. There is normal pulmonary artery systolic pressure.  3. A small pericardial effusion is present.  4. The mitral valve is normal in structure. Trivial mitral valve regurgitation. No evidence of mitral stenosis.  5. The aortic valve is normal in structure. Aortic valve regurgitation is not visualized. No aortic stenosis is present. FINDINGS  Left Ventricle: Left ventricular ejection fraction, by estimation, is 65 to 70%. The left ventricle has normal function. The left ventricle  has no regional wall motion abnormalities. The left ventricular internal cavity size was normal in size. There is  no left ventricular hypertrophy. Left ventricular diastolic parameters were normal. Right Ventricle: The right ventricular size is normal. Right vetricular wall thickness was not well visualized. Right ventricular systolic function is normal. There is normal pulmonary artery systolic pressure. The tricuspid regurgitant velocity is 2.10 m/s, and with an assumed right atrial pressure of 3 mmHg, the estimated right ventricular systolic pressure is  20.6 mmHg. Left Atrium: Left atrial size was normal in size. Right Atrium: Right atrial size was normal in size. Pericardium: A small pericardial effusion is present. Mitral Valve: The mitral valve is normal in structure. Trivial mitral valve regurgitation. No evidence of mitral valve stenosis. Tricuspid Valve: The tricuspid valve is grossly normal. Tricuspid valve regurgitation is trivial. Aortic Valve: The aortic valve is normal in structure. Aortic valve regurgitation is not visualized. No aortic stenosis is present. Pulmonic Valve: The pulmonic valve was normal in structure. Pulmonic valve regurgitation is not visualized. Aorta: The aortic root and ascending aorta are structurally normal, with no evidence of dilitation. IAS/Shunts: The atrial septum is grossly normal.  LEFT VENTRICLE PLAX 2D LVIDd:         4.50 cm     Diastology LVIDs:         2.40 cm     LV e' medial:    7.51 cm/s LV PW:         1.30 cm     LV E/e' medial:  12.5 LV IVS:        1.20 cm     LV e' lateral:   7.10 cm/s LVOT diam:     2.00 cm     LV E/e' lateral: 13.2 LV SV:         58 LV SV Index:   32 LVOT Area:     3.14 cm  LV Volumes (MOD) LV vol d, MOD A2C: 69.4 ml LV vol d, MOD A4C: 76.1 ml LV vol s, MOD A2C: 23.4 ml LV vol s, MOD A4C: 26.8 ml LV SV MOD A2C:     46.0 ml LV SV MOD A4C:     76.1 ml LV SV MOD BP:      48.9 ml RIGHT VENTRICLE RV S prime:     14.40 cm/s TAPSE (M-mode): 1.8 cm  LEFT ATRIUM             Index        RIGHT ATRIUM           Index LA diam:        3.60 cm 2.00 cm/m   RA Area:     11.30 cm LA Vol (A2C):   48.4 ml 26.85 ml/m  RA Volume:   21.90 ml  12.15 ml/m LA Vol (A4C):   41.4 ml 22.96 ml/m LA Biplane Vol: 47.3 ml 26.24 ml/m  AORTIC VALVE LVOT Vmax:   107.00 cm/s LVOT Vmean:  67.900 cm/s LVOT VTI:    0.185 m  AORTA Ao Root diam: 2.90 cm Ao Asc diam:  2.80 cm MITRAL VALVE               TRICUSPID VALVE MV Area (PHT): 3.42 cm    TR Peak grad:   17.6 mmHg MV Decel Time: 222 msec    TR Vmax:        210.00 cm/s MV E velocity: 93.50 cm/s MV A velocity: 91.30 cm/s  SHUNTS MV E/A ratio:  1.02        Systemic VTI:  0.18 m                            Systemic Diam: 2.00 cm Mertie Moores MD Electronically signed by Mertie Moores MD Signature Date/Time: 02/06/2022/12:41:38 PM    Final      PHYSICAL EXAM Temp:  [97.8 F (36.6 C)-99.3 F (37.4 C)] 98.3 F (36.8 C) (02/21  0800) Pulse Rate:  [58-85] 64 (02/21 0700) Resp:  [6-25] 24 (02/21 0700) BP: (96-160)/(61-90) 123/69 (02/21 0700) SpO2:  [94 %-100 %] 98 % (02/21 0700) Arterial Line BP: (121-191)/(64-95) 155/72 (02/21 0700) FiO2 (%):  [40 %] 40 % (02/21 0313)  General - Well nourished, well developed, woman intubated, on sedation  Cardiovascular - Regular rhythm and rate on telemetry.  Mental Status -  Patient opens eyes to voice and pain; intubated  Cranial Nerves II - XII - II - Pupils pinpoint and sluggish. Does not blink to threat bilaterally, corneal weak on the right, absent to the left, oculocephalic reflex intact III, IV, VI - Dysconjugate gaze (upward gaze only). Doll's eye intact. VIII - Hearing appears to be intact bilaterally, as patient opens eyes to voice.  X - Gag and cough reflex intact  Motor Strength - Patient withdraws to pain on R side in both extremities. No withdrawal to pain or meaningful movements in L sided extremities.   Plantar Reflexes: Negative Babinski bilaterally.  Gait and  Station - deferred.    ASSESSMENT/PLAN Ms. MCKINLEY COLN is a 51 y.o. female with history of HTN presenting with AMS, L sided weakness, and slurred speech.   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 Intra-axial blood volume slightly greater than 33 mL. Mass effect with 4-5 mm leftward midline shift; superimposed chronic meningocele progressing since 2016.  Repeat CTH at 0045 with 36 mL hemorrhagic area, and 0835 repeat unchanged. CTA head & neck No large vessel occlusion, intracranial aneurysm, or abnormal vasculature associated with hemorrhage. 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 CT repeat 2/20 stable hematoma and MLS Obtain MRI today to assess mass effect and hemorrhage. 2D Echo LVEF 65-70%, small pericardial effusion present; trival MVR LDL 64 HgbA1c 5.6 VTE prophylaxis - SCDs No antithrombotic prior to admission, now on No antithrombotic.  Therapy recommendations:  Pending PT/OT/SLP eval when patient more stable Disposition:  Pending   Cerebral edema CT head showed midline shift 5 mm Repeat CT stable hematoma and midline shift On 3% saline 75->50->NS Na 141-> 146-> 152-> 154->157 Na monitoring  Hypertensive emergency SBP as high as 250 on presentation Home meds:  Coreg 25 mg BID, HCTZ 25 Unstable, on Cleviprex Continue Norvasc 10 mg and Hydralazine 100 mg q8h (increased by Critical Care team) Due to inc Cr, d/c'd Losartan, will dec Coreg again to 6.25 mg BID, and will intitiate Clonidine 0.1 mg TID BP goal less than 160 Long-term BP goal normotensive  Elevated Cr/ AKI Cr inc from 0.85 to 1.58. Improved to 1.29 today D/c Losartan, Dec Coreg to 12.5 mg BID Patient outside of permitted hypernatremia range 150-155, so hypertonic switched to NS.  Respiratory failure Intubated in the ED  for airway protection Not a candidate for extubation today Will continue to assess for extubation daily   Other Stroke Risk Factors Family hx stroke (Maternal Uncle)  Other Active Problems Leukocytosis WBC 15.4-11.8-14.1, UA negative Pressure ulcer of L hand  Hospital day # 2  Rosezetta Schlatter, MD Stroke Neurology- Neuro Psych Resident 02/07/2022 8:08 AM  ATTENDING NOTE: I reviewed above note and agree with the assessment and plan. Pt was seen and examined.   Patient daughter and RN are at bedside.  Patient overnight has agitation and hypertensive urgency, put back on propofol and fentanyl drip also increased Cleviprex for BP control.  This morning, patient intubated, barely open eyes on voice, pinpoint pupils, absence left corneal but mild right corneal, sluggish doll's eyes, positive gag and cough, still has left hemiplegia.  Will repeat MRI to evaluate hematoma and midline shift.  Sodium 157, changed 3% saline to normal saline.  Given bradycardia, further decrease Coreg dose, increase hydralazine dose and add clonidine, to taper off Cleviprex as able.  Not candidate for extubation today.  Appreciate CCM assistance.  For detailed assessment and plan, please refer to above as I have made changes wherever appropriate.   Rosalin Hawking, MD PhD Stroke Neurology 02/07/2022 10:25 PM  This patient is critically ill due to large left hematoma with significant cerebral edema, respiratory failure and at significant risk of neurological worsening, death form brain herniation, brain death, renal failure, seizure. This patient's care requires constant monitoring of vital signs, hemodynamics, respiratory and cardiac monitoring, review of multiple databases, neurological assessment, discussion with family, other specialists and medical decision making of high complexity. I spent 35 minutes of neurocritical care time in the care of this patient. I had long discussion with daughter at bedside, updated pt  current condition, treatment plan and potential prognosis, and answered all the questions.  She expressed understanding and appreciation.      To contact Stroke Continuity provider, please refer to [email or web address] After hours, contact General Neurology

## 2022-02-07 NOTE — Progress Notes (Signed)
RT transported patient to MRI and back with RN. No complications. RT will continue to monitor.  ?

## 2022-02-07 NOTE — Progress Notes (Signed)
Initial Nutrition Assessment  DOCUMENTATION CODES:   Not applicable  INTERVENTION:   Initiate tube feeding via OG tube: Vital 1.5 at 20 ml/h ( 480 ml per day) Prosource TF 90 ml TID  Provides 960 kcal, 98 gm protein, 364 ml free water daily  MVI daily per tube  Propofol and Cleviprex providing additional kcal from lipid    NUTRITION DIAGNOSIS:   Inadequate oral intake related to inability to eat as evidenced by NPO status.  GOAL:   Patient will meet greater than or equal to 90% of their needs  MONITOR:   TF tolerance, I & O's  REASON FOR ASSESSMENT:   Consult Enteral/tube feeding initiation and management  ASSESSMENT:   Pt with PMH of HTN admitted with acute R ICH  and midline shift. 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   Daughter in room on the phone.   Patient is currently intubated on ventilator support MV: 10.3 L/min Temp (24hrs), Avg:98.6 F (37 C), Min:98.1 F (36.7 C), Max:99.3 F (37.4 C)  Propofol: 13 ml/hr provides: 343 kcal  Cleviprex @ 32 ml/hr provides: 1536 kcal   Medications reviewed and include: decadron, colace, protonix, miralax, senokot-s Fentanyl   Labs reviewed: Na 159 CBG's: 162  NUTRITION - FOCUSED PHYSICAL EXAM:  Flowsheet Row Most Recent Value  Orbital Region No depletion  Upper Arm Region No depletion  Thoracic and Lumbar Region No depletion  Buccal Region No depletion  Temple Region No depletion  Clavicle Bone Region No depletion  Clavicle and Acromion Bone Region No depletion  Scapular Bone Region No depletion  Dorsal Hand No depletion  Patellar Region No depletion  Anterior Thigh Region No depletion  Posterior Calf Region No depletion  Edema (RD Assessment) Moderate  Hair Unable to assess  Eyes Unable to assess  Mouth Unable to assess  Skin Reviewed  Nails Reviewed       Diet Order:   Diet Order             Diet NPO time specified  Diet effective now                    EDUCATION NEEDS:   Not appropriate for education at this time  Skin:  Skin Assessment: Skin Integrity Issues: Skin Integrity Issues:: Stage II Stage II: L hand  Last BM:  unknown  Height:   Ht Readings from Last 1 Encounters:  02/05/22 5\' 4"  (1.626 m)    Weight:   Wt Readings from Last 1 Encounters:  02/05/22 74.8 kg    BMI:  Body mass index is 28.32 kg/m.  Estimated Nutritional Needs:   Kcal:  1700-2000  Protein:  85-100 grams  Fluid:  > 1.7 L/day  Lockie Pares., RD, LDN, CNSC See [facility] for contact information

## 2022-02-08 ENCOUNTER — Inpatient Hospital Stay (HOSPITAL_COMMUNITY): Payer: 59

## 2022-02-08 DIAGNOSIS — J9602 Acute respiratory failure with hypercapnia: Secondary | ICD-10-CM

## 2022-02-08 DIAGNOSIS — J9621 Acute and chronic respiratory failure with hypoxia: Secondary | ICD-10-CM

## 2022-02-08 DIAGNOSIS — J9600 Acute respiratory failure, unspecified whether with hypoxia or hypercapnia: Secondary | ICD-10-CM

## 2022-02-08 LAB — CBC
HCT: 36 % (ref 36.0–46.0)
Hemoglobin: 11.5 g/dL — ABNORMAL LOW (ref 12.0–15.0)
MCH: 28.6 pg (ref 26.0–34.0)
MCHC: 31.9 g/dL (ref 30.0–36.0)
MCV: 89.6 fL (ref 80.0–100.0)
Platelets: 179 10*3/uL (ref 150–400)
RBC: 4.02 MIL/uL (ref 3.87–5.11)
RDW: 17.1 % — ABNORMAL HIGH (ref 11.5–15.5)
WBC: 12.1 10*3/uL — ABNORMAL HIGH (ref 4.0–10.5)
nRBC: 0.2 % (ref 0.0–0.2)

## 2022-02-08 LAB — BASIC METABOLIC PANEL
BUN: 38 mg/dL — ABNORMAL HIGH (ref 6–20)
CO2: 16 mmol/L — ABNORMAL LOW (ref 22–32)
Calcium: 9.2 mg/dL (ref 8.9–10.3)
Chloride: >130 mmol/L (ref 98–111)
Creatinine, Ser: 1.37 mg/dL — ABNORMAL HIGH (ref 0.44–1.00)
GFR, Estimated: 47 mL/min — ABNORMAL LOW (ref >60)
Glucose, Bld: 158 mg/dL — ABNORMAL HIGH (ref 70–99)
Potassium: 4.1 mmol/L (ref 3.5–5.1)
Sodium: 155 mmol/L — ABNORMAL HIGH (ref 135–145)

## 2022-02-08 LAB — GLUCOSE, CAPILLARY
Glucose-Capillary: 133 mg/dL — ABNORMAL HIGH (ref 70–99)
Glucose-Capillary: 143 mg/dL — ABNORMAL HIGH (ref 70–99)
Glucose-Capillary: 153 mg/dL — ABNORMAL HIGH (ref 70–99)
Glucose-Capillary: 162 mg/dL — ABNORMAL HIGH (ref 70–99)
Glucose-Capillary: 171 mg/dL — ABNORMAL HIGH (ref 70–99)
Glucose-Capillary: 201 mg/dL — ABNORMAL HIGH (ref 70–99)

## 2022-02-08 LAB — MAGNESIUM
Magnesium: 2.1 mg/dL (ref 1.7–2.4)
Magnesium: 2.4 mg/dL (ref 1.7–2.4)

## 2022-02-08 LAB — SODIUM
Sodium: 156 mmol/L — ABNORMAL HIGH (ref 135–145)
Sodium: 157 mmol/L — ABNORMAL HIGH (ref 135–145)

## 2022-02-08 LAB — PHOSPHORUS
Phosphorus: 3.2 mg/dL (ref 2.5–4.6)
Phosphorus: 3.1 mg/dL (ref 2.5–4.6)

## 2022-02-08 MED ORDER — SODIUM CHLORIDE 0.9 % IV SOLN: INTRAVENOUS | Status: DC

## 2022-02-08 MED ORDER — CARVEDILOL 3.125 MG PO TABS
3.1250 mg | ORAL_TABLET | Freq: Two times a day (BID) | ORAL | Status: DC
  Administered 2022-02-09 – 2022-02-11 (×5): 3.125 mg
  Filled 2022-02-08 (×5): qty 1

## 2022-02-08 MED ORDER — DEXMEDETOMIDINE HCL IN NACL 400 MCG/100ML IV SOLN
0.4000 ug/kg/h | INTRAVENOUS | Status: DC
  Administered 2022-02-08 (×2): 0.6 ug/kg/h via INTRAVENOUS
  Administered 2022-02-09: 1 ug/kg/h via INTRAVENOUS
  Administered 2022-02-09: 0.9 ug/kg/h via INTRAVENOUS
  Filled 2022-02-08 (×4): qty 100

## 2022-02-08 MED ORDER — VITAL 1.5 CAL PO LIQD
1000.0000 mL | ORAL | Status: AC
Start: 2022-02-08 — End: 2022-02-15
  Administered 2022-02-08 – 2022-02-15 (×7): 1000 mL
  Filled 2022-02-08 (×4): qty 1000

## 2022-02-08 MED ORDER — INSULIN ASPART 100 UNIT/ML IJ SOLN
0.0000 [IU] | INTRAMUSCULAR | Status: DC
  Administered 2022-02-08: 2 [IU] via SUBCUTANEOUS
  Administered 2022-02-08: 3 [IU] via SUBCUTANEOUS
  Administered 2022-02-08: 1 [IU] via SUBCUTANEOUS
  Administered 2022-02-08: 2 [IU] via SUBCUTANEOUS
  Administered 2022-02-08: 1 [IU] via SUBCUTANEOUS
  Administered 2022-02-09 (×3): 3 [IU] via SUBCUTANEOUS

## 2022-02-08 MED ORDER — PROSOURCE TF PO LIQD
45.0000 mL | Freq: Two times a day (BID) | ORAL | Status: DC
  Administered 2022-02-09 – 2022-03-21 (×80): 45 mL
  Filled 2022-02-08 (×81): qty 45

## 2022-02-08 NOTE — Progress Notes (Signed)
eLink Physician-Brief Progress Note Patient Name: Sarah Myers DOB: [birth year] MRN: [id number]   Date of Service  02/08/2022  HPI/Events of Note  Nursing reports 3 second pause. Monitor alarmed for ST elevation in I and aVL. Also has has several episodes of bradycardia into 60's. BP = 134/81 with MAP = 97. HR = 37 to 54 with pauses. EGG reveals sinus rhythm with premature atrial complexes and  ST & T wave abnormality, consider inferolateral ischemia.  eICU Interventions  Plan: Cycle Troponin. Decrease Coreg dose to 3.125 mg per tube BID.     Intervention Category Major Interventions: Arrhythmia - evaluation and management  Lyrik Dockstader Cornelia Copa 02/08/2022, 10:47 PM

## 2022-02-08 NOTE — Progress Notes (Addendum)
Pt has had 3 second pause twice since 2233, 12-lead EKG obtained, E-link notified, awaiting orders.

## 2022-02-08 NOTE — Progress Notes (Addendum)
STROKE TEAM PROGRESS NOTE   SUBJECTIVE (INTERVAL HISTORY)  Patient is seen alone at the bedside.  Overall her condition is gradually improving. Patient opens eyes to voice and painful stimuli. Patient became agitated this morning, so Propofol wean was halted and increased. Blood pressure has been stable and patient's sodium has maintained around 155-156 range on NS.    Repeat MRI with some extension of edema and minimal extension of ICH.  OBJECTIVE CBC:  Recent Labs  Lab 02/06/22 1205 02/07/22 0320 02/08/22 0636  WBC 13.8* 14.1* 12.1*  NEUTROABS 12.1* 12.7*  --   HGB 11.5* 11.3* 11.5*  HCT 35.5* 35.0* 36.0  MCV 87.0 89.5 89.6  PLT 198 200 179    Basic Metabolic Panel:  Recent Labs  Lab 02/07/22 0320 02/07/22 0813 02/07/22 1831 02/07/22 2206 02/08/22 0636  NA 158*   < >  --  155* 155*   156*  K 4.0  --   --   --  4.1  CL >130*  --   --   --  >130*  CO2 19*  --   --   --  16*  GLUCOSE 151*  --   --   --  158*  BUN 29*  --   --   --  38*  CREATININE 1.29*  --   --   --  1.37*  CALCIUM 8.8*  --   --   --  9.2  MG 2.2  --  2.0  --  2.1  PHOS  --    < > 3.2  --  3.2   < > = values in this interval not displayed.    Lipid Panel:  Recent Labs  Lab 02/06/22 0432  CHOL 146  TRIG 180*  HDL 46  CHOLHDL 3.2  VLDL 36  LDLCALC 64    HgbA1c:  Recent Labs  Lab 02/06/22 0432  HGBA1C 5.6    Urine Drug Screen:  Recent Labs  Lab 02/05/22 1338  LABOPIA NONE DETECTED  COCAINSCRNUR NONE DETECTED  LABBENZ NONE DETECTED  AMPHETMU NONE DETECTED  THCU NONE DETECTED  LABBARB NONE DETECTED     Alcohol Level No results for input(s): ETH in the last 168 hours.  IMAGING past 24 hours MR BRAIN WO CONTRAST  Result Date: 02/07/2022 CLINICAL DATA:  Neuro deficit, acute, stroke suspected; intracranial hemorrhage follow-up EXAM: MRI HEAD WITHOUT CONTRAST TECHNIQUE: Multiplanar, multiecho pulse sequences of the brain and surrounding structures were obtained without intravenous  contrast. COMPARISON:  Correlation made with recent CT imaging FINDINGS: Brain: Heterogeneous but primarily T2 hypointense and T1 isointense hematoma identified involving the right basal ganglia, surrounding white matter, and extending into the temporal lobe. Some T1 hyperintensity is present. Surrounding edema is noted with regional mass effect. Partial effacement of the adjacent ventricles with stable mild leftward midline shift. No hydrocephalus. As noted on CT, there is are intraventricular blood products within the occipital horns reflecting extension from above hematoma. No evidence of prior hemorrhage remote from above. No extra-axial collection. Patchy foci of T2 hyperintensity in the supratentorial white matter nonspecific but probably reflect chronic microvascular ischemic changes. Vascular: Major vessel flow voids at the skull base are preserved. Skull and upper cervical spine: Normal marrow signal is preserved. Sinuses/Orbits: Paranasal sinus mucosal thickening. Orbits are unremarkable. Other: Sella is unremarkable.  Patchy mastoid fluid opacification. IMPRESSION: Acute to early subacute large parenchymal hemorrhage as seen on recent CT imaging with similar surrounding edema and regional mass effect. Intraventricular extension is again noted.  No hydrocephalus. No evidence of prior hemorrhage elsewhere. No contrast administered to assess for underlying lesion. Electronically Signed   By: Guadlupe Spanish M.D.   On: 02/07/2022 17:38     PHYSICAL EXAM Temp:  [97.8 F (36.6 C)-99 F (37.2 C)] 98.3 F (36.8 C) (02/22 0400) Pulse Rate:  [57-67] 59 (02/22 0600) Resp:  [0-24] 21 (02/22 0600) BP: (101-143)/(62-89) 119/69 (02/22 0600) SpO2:  [91 %-99 %] 97 % (02/22 0600) Arterial Line BP: (124-133)/(61-65) 124/61 (02/21 1100) FiO2 (%):  [40 %] 40 % (02/22 0334)  General - Well nourished, well developed, woman intubated, on sedation  Cardiovascular - Regular rhythm and rate on telemetry. No longer  bradycardic with stimulation.  Mental Status -  Patient opens eyes to voice and pain; intubated  Cranial Nerves II - XII - II - Pupils pinpoint and sluggish. Blinks to threat intermittently on the R but not on L, corneal weak on the right, absent to the left, oculocephalic reflex intact. EOM to R intact on command, unable to cross midline for L gaze. III, IV, VI - Dysconjugate gaze significantly improved.  VIII - Hearing appears to be intact bilaterally, as patient opens eyes to voice.  X - Gag and cough reflex intact  Motor Strength - Patient withdraws to pain on R side in both extremities. No withdrawal to pain or meaningful movements in L sided extremities.   Plantar Reflexes: Negative Babinski bilaterally.  Gait and Station - deferred.    ASSESSMENT/PLAN Ms. ELIANA LUETH is a 51 y.o. female with history of HTN presenting with AMS, L sided weakness, and slurred speech.   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 Intra-axial blood volume slightly greater than 33 mL. Mass effect with 4-5 mm leftward midline shift; superimposed chronic meningocele progressing since 2016.  Repeat CTH at 0045 with 36 mL hemorrhagic area, and 0835 repeat unchanged. CTA head & neck No large vessel occlusion, intracranial aneurysm, or abnormal vasculature associated with hemorrhage. 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 CT repeat 2/20 stable hematoma and MLS MRI with some extension of edema and stable ICH. 2D Echo LVEF 65-70%, small pericardial effusion present; trival MVR LDL 64 HgbA1c 5.6 VTE prophylaxis - SCDs No antithrombotic prior to admission, now on No antithrombotic.  Therapy recommendations:  Pending PT/OT/SLP eval when patient more stable Disposition:  Pending   Cerebral edema CT head showed midline shift  5 mm;  Repeat CT stable hematoma and midline shift MRI with some extension of edema and stable ICH, intraventricular extension noted. On saline 75->50->NS Na 141-> 146-> 152-> 154->157-> 155->156 Na monitoring  Hypertensive emergency SBP as high as 250 on presentation Home meds:  Coreg 25 mg BID, HCTZ 25 Now off Cleviprex Continue Norvasc 10 mg and Hydralazine 100 mg q8h  Due to inc Cr, d/c'd Losartan; continue Coreg 6.25 mg BID and Clonidine 0.1 mg TID BP goal less than 160 Long-term BP goal normotensive  Elevated Cr/ AKI Cr inc from 0.85->1.58->1.29->1.37 D/c Losartan BMP monitoring Increase IVF to 50 cc/hr  Respiratory failure Intubated in the ED for airway protection Agitation on weaning this am Switching sedation from Propofol to Precedex per Critical Care team Possible extubation tomorrow   Other Stroke Risk Factors Family hx stroke (Maternal Uncle)  Other Active Problems Leukocytosis WBC 15.4->11.8->14.1->12.1, UA negative Pressure ulcer of L hand  Hospital day # 3  Lamar Sprinkles, MD Stroke Neurology- Neuro Psych Resident 02/08/2022 8:20 AM  ATTENDING NOTE: I reviewed above note and agree with the assessment and plan. Pt was seen and examined.   No family at bedside.  Patient lying in bed, still intubated. Neurological examination similar to yesterday, opens eyes on voice, however not blinking to visual threat, slight tracking to the right, not tracking to the left.  Pinpoint pupils, left corneal absent, right corneal weak, positive gag and cough, mild withdrawal on the right with pain stimulation, left hemiplegia.  MRI yesterday showed stable hematoma and slightly increased cerebral edema, she did not tolerating weaning this morning due to agitation and apnea.  Put back on propofol and fentanyl.  CCM Dr. Denese Killings plan to switch sedation to Precedex in order to wean off ventilation.  BP better, off Cleviprex, continue Norvasc, hydralazine, Coreg and clonidine.   Sodium 156, creatinine slightly increased from yesterday, urine output on the low end, increase normal saline to 50 cc/h.  For detailed assessment and plan, please refer to above as I have made changes wherever appropriate.   Marvel Plan, MD PhD Stroke Neurology 02/08/2022 3:06 PM  This patient is critically ill due to large right BG ICH with IVH, cerebral edema, respiratory failure, hypertensive emergency and at significant risk of neurological worsening, death form brain herniation, brain death, heart failure, hypertensive encephalopathy, seizure. This patient's care requires constant monitoring of vital signs, hemodynamics, respiratory and cardiac monitoring, review of multiple databases, neurological assessment, discussion with family, other specialists and medical decision making of high complexity. I spent 35 minutes of neurocritical care time in the care of this patient.  I discussed with CCM Dr. Denese Killings    To contact Stroke Continuity provider, please refer to [email or web address]. After hours, contact General Neurology

## 2022-02-08 NOTE — Progress Notes (Addendum)
NAME:  Sarah Myers, MRN:  [id number], DOB:  [birth year], LOS: 3 ADMISSION DATE:  02/05/2022, CONSULTATION DATE:  02/05/22 REFERRING MD:  Selina Cooley - neuro , CHIEF COMPLAINT:  Mechanically Ventilated  // ICH    History of Present Illness:  51 yo F PMH HTN presented to [facility] 2/19 with CC AMS. LKW 2100 2/18, at 0600 2/19 found to have L sided weakness, slurred speech. Evaluated as code stroke and found to have R side ICH with mass effect (brain compression) present on admission.  Intubated in ED for airway protection. Started on 3% saline, cleviprex. Admitted to Neuro team  PCCM consulted in this setting to assist with medical management   Na 154 ( Goal 150-155) , K 4.1, Creatinine 1.58, ( 0.85) BUN 24,( 16) Chloride 126  Serum CBC- WBC 11.8 ( 15.4)  hgb 12.1 ( 16.0>> 13.3>>12.6>>12.1) HCT  35.4 Net + 3.1  L   Pertinent  Medical History  HTN  Significant Hospital Events: Including procedures, antibiotic start and stop dates in addition to other pertinent events   2/19 admitted to neuro with acute R ICH. Intubated for airway protection. Started on 3% and  cleviprex. PCCM consult. NSGY consult  02/06/2022 For repeat CT Head >> Overnight CT showed increase in size of bleed to 36 ml , but unchanged midline shift at 4 mm . Repeating CT head again 2/20 am. Neurosurgery is aware 2/21: 3% saline stopped due to Na 158. weaned off cleviprex. Remains intubated. 2/22: neuro exam much improved (moves RUE and RLE to command; thumbs up; blinks eyes to command; sticks tongue out)  Interim History / Subjective:  Intubated on mech vent PRVC On prop/fent neuro exam much improved (moves RUE and RLE to command; thumbs up; blinks eyes to command; sticks tongue out) UOP remains low 590 ml last 24 hours; net positive 7.6 L  Objective   Blood pressure 119/69, pulse (!) 59, temperature 98.3 F (36.8 C), temperature source Axillary, resp. rate (!) 21, height 5\' 4"  (1.626 m), weight 74.8 kg, SpO2 97 %. CVP:  [4  mmHg-24 mmHg] 13 mmHg  Vent Mode: PRVC FiO2 (%):  [40 %] 40 % Set Rate:  [24 bmp] 24 bmp Vt Set:  [430 mL] 430 mL PEEP:  [5 cmH20] 5 cmH20 Plateau Pressure:  [18 cmH20-30 cmH20] 20 cmH20   Intake/Output Summary (Last 24 hours) at 02/08/2022 0719 Last data filed at 02/08/2022 0600 Gross per 24 hour  Intake 3111.99 ml  Output 590 ml  Net 2521.99 ml    Filed Weights   02/05/22 0913  Weight: 74.8 kg    Examination: General: critically ill appearing on mech vent HEENT: MM pink/moist; ETT in place Neuro: Sedated w/ prop/fent; moves RUE and RLE to command; thumbs up; blinks eyes to command; sticks tongue out; pupils pin point CV: s1s2, no m/r/g PULM:  dim clear BS bilaterally; on mech vent PRVC GI: soft, bsx4 active  Extremities: warm/dry, trace UE > LE edema  Skin: no rashes or lesions appreciated  Resolved Hospital Problem list     Assessment & Plan:   Acute respiratory insufficiency requiring intubation due to intracranial process  P: -cont mech vent prvc 6-8 cc/kg -wean fio2 for sats >92% -vap prevention in place -abg/cxr prn -consider sbt/sat  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 -- " Brain Compression" -- present on admission  Incidental finding:  proximal L PCA severe stenosis vs occlusion  Increase in bleed overnight , but no  increase in shift Neuro status decline overnight  P: -neurosurgery following; appreciate recs -Na 150-155; 3% held yesterday -SBP goal <160 per neuro; cleviprex stopped 2/21; continue BP meds -continue steroids -frequent neuro checks  Hypertensive emergency Underlying uncontrolled HTN Episodes of bradycardia overnight 2/20: coreg dose decreased -home coreg and HCTZ P: -SBP goal <160 -cont coreg, hydralazine, and norvasc  AKI -Suspect baseline BP was much higher, and renal function may reflect controlled BP Poor UO P: -check renal US -Trend BMP / urinary output -Replace electrolytes as  indicated -Avoid nephrotoxic agents, ensure adequate renal perfusion  Hyperglycemia -a1c 5.6 2/20 -likely steroid use related P: -ssi and cbg  Anemia -HGB drop from HGB has dropped from 16 on 2/19 to 12.1 on 2/20.  ? If hemodilutional ( + 3 L last 24 hours )  P: -trend cbc -Transfuse for HGB < 7  Hypokalemia P: -trend bmp/mag -replete as needed  Leukocytosis - suspect reactive 2/2 ICH; trending down P: -trend wbc/fever curve   Best Practice (right click and "Reselect all SmartList Selections" daily)   Diet/type: tubefeeds DVT prophylaxis: SCD GI prophylaxis: PPI Lines: N/A Foley:  Yes, and it is still needed Code Status:  full code Last date of multidisciplinary goals of care discussion [per primary]  Critical care time: 35 min     JD Anselm Lis [facility] 02/08/2022, 7:19 AM  Please see [email or web address] for pager details.  From 7A-7P if no response, please call [phone]. After hours, please call ELink [phone].

## 2022-02-08 NOTE — Progress Notes (Signed)
Patients chloride was a critical high of 130. MD is aware.

## 2022-02-08 NOTE — Progress Notes (Signed)
Nutrition Follow-up  DOCUMENTATION CODES:   Not applicable  INTERVENTION:   Tube feeding via OG tube: Increase Vital 1.5 to 50 ml/h (1200 ml per day) Decrease ProSource TF to 45 ml BID  Provides 1880 kcal, 103 gm protein, 912 ml free water daily  MVI daily per tube   NUTRITION DIAGNOSIS:   Inadequate oral intake related to inability to eat as evidenced by NPO status. Ongoing.   GOAL:   Patient will meet greater than or equal to 90% of their needs Progressing.   MONITOR:   TF tolerance, I & O's  REASON FOR ASSESSMENT:   Consult Enteral/tube feeding initiation and management  ASSESSMENT:   Pt with PMH of HTN admitted with acute R ICH  and midline shift. 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   Pt discussed during ICU rounds and with RN. Pt with blisters to hand and wrist from propofol infiltration.  No family in the room. Propofol and cleviprex weaned off.  Per MD unable to tolerate SBT due to apnea. Off 3% with elevated Na.   Patient is currently intubated on ventilator support MV: 7.7 L/min Temp (24hrs), Avg:98.7 F (37.1 C), Min:97.8 F (36.6 C), Max:99.5 F (37.5 C)  Medications reviewed and include: decadron, colace, SSI, MVI with minerals, protonix, miralax, senokot-s Precedex  Fentanyl   Labs reviewed: Na 157, Cl: >130 CBG's: 133-162   Diet Order:   Diet Order             Diet NPO time specified  Diet effective now                   EDUCATION NEEDS:   Not appropriate for education at this time  Skin:  Skin Assessment: Skin Integrity Issues: Skin Integrity Issues:: Stage II Stage II: L hand  Last BM:  2/22 medium  Height:   Ht Readings from Last 1 Encounters:  02/05/22 5\' 4"  (1.626 m)    Weight:   Wt Readings from Last 1 Encounters:  02/05/22 74.8 kg    BMI:  Body mass index is 28.32 kg/m.  Estimated Nutritional Needs:   Kcal:  1700-2000  Protein:  85-100 grams  Fluid:  > 1.7  L/day  Lockie Pares., RD, LDN, CNSC See AMiON for contact information

## 2022-02-08 NOTE — Progress Notes (Signed)
eLink Physician-Brief Progress Note Patient Name: Sarah Myers DOB: 26-Jun-1971 MRN: [id number]   Date of Service  02/08/2022  HPI/Events of Note  Hyperglycemia - Blood glucose = 162.   eICU Interventions  Plan: Q 4 hour sensitive Novolog SSI     Intervention Category Major Interventions: Hyperglycemia - active titration of insulin therapy  Sarah Myers 02/08/2022, 4:16 AM

## 2022-02-09 ENCOUNTER — Inpatient Hospital Stay (HOSPITAL_COMMUNITY): Payer: 59

## 2022-02-09 ENCOUNTER — Inpatient Hospital Stay: Payer: Self-pay

## 2022-02-09 DIAGNOSIS — R778 Other specified abnormalities of plasma proteins: Secondary | ICD-10-CM

## 2022-02-09 DIAGNOSIS — R509 Fever, unspecified: Secondary | ICD-10-CM

## 2022-02-09 DIAGNOSIS — N179 Acute kidney failure, unspecified: Secondary | ICD-10-CM

## 2022-02-09 DIAGNOSIS — J9601 Acute respiratory failure with hypoxia: Secondary | ICD-10-CM

## 2022-02-09 DIAGNOSIS — I161 Hypertensive emergency: Secondary | ICD-10-CM

## 2022-02-09 DIAGNOSIS — R34 Anuria and oliguria: Secondary | ICD-10-CM

## 2022-02-09 LAB — ECHOCARDIOGRAM COMPLETE BUBBLE STUDY
AR max vel: 2.09 cm2
AV Area VTI: 2.12 cm2
AV Area mean vel: 2.07 cm2
AV Mean grad: 6 mmHg
AV Peak grad: 11.7 mmHg
Ao pk vel: 1.71 m/s
Area-P 1/2: 2.68 cm2
Calc EF: 61 %
S' Lateral: 2.8 cm
Single Plane A2C EF: 61.5 %
Single Plane A4C EF: 63.4 %

## 2022-02-09 LAB — CBC
HCT: 37.2 % (ref 36.0–46.0)
Hemoglobin: 12 g/dL (ref 12.0–15.0)
MCH: 28.5 pg (ref 26.0–34.0)
MCHC: 32.3 g/dL (ref 30.0–36.0)
MCV: 88.4 fL (ref 80.0–100.0)
Platelets: 155 10*3/uL (ref 150–400)
RBC: 4.21 MIL/uL (ref 3.87–5.11)
RDW: 16.8 % — ABNORMAL HIGH (ref 11.5–15.5)
WBC: 13.9 10*3/uL — ABNORMAL HIGH (ref 4.0–10.5)
nRBC: 0.3 % — ABNORMAL HIGH (ref 0.0–0.2)

## 2022-02-09 LAB — BASIC METABOLIC PANEL
Anion gap: 10 (ref 5–15)
BUN: 37 mg/dL — ABNORMAL HIGH (ref 6–20)
CO2: 16 mmol/L — ABNORMAL LOW (ref 22–32)
Calcium: 9.3 mg/dL (ref 8.9–10.3)
Chloride: 129 mmol/L — ABNORMAL HIGH (ref 98–111)
Creatinine, Ser: 1.29 mg/dL — ABNORMAL HIGH (ref 0.44–1.00)
GFR, Estimated: 51 mL/min — ABNORMAL LOW (ref >60)
Glucose, Bld: 218 mg/dL — ABNORMAL HIGH (ref 70–99)
Potassium: 4.3 mmol/L (ref 3.5–5.1)
Sodium: 155 mmol/L — ABNORMAL HIGH (ref 135–145)

## 2022-02-09 LAB — MRSA NEXT GEN BY PCR, NASAL: MRSA by PCR Next Gen: NOT DETECTED

## 2022-02-09 LAB — GLUCOSE, CAPILLARY
Glucose-Capillary: 132 mg/dL — ABNORMAL HIGH (ref 70–99)
Glucose-Capillary: 152 mg/dL — ABNORMAL HIGH (ref 70–99)
Glucose-Capillary: 163 mg/dL — ABNORMAL HIGH (ref 70–99)
Glucose-Capillary: 182 mg/dL — ABNORMAL HIGH (ref 70–99)
Glucose-Capillary: 205 mg/dL — ABNORMAL HIGH (ref 70–99)
Glucose-Capillary: 244 mg/dL — ABNORMAL HIGH (ref 70–99)

## 2022-02-09 LAB — SODIUM
Sodium: 153 mmol/L — ABNORMAL HIGH (ref 135–145)
Sodium: 155 mmol/L — ABNORMAL HIGH (ref 135–145)
Sodium: 156 mmol/L — ABNORMAL HIGH (ref 135–145)
Sodium: 156 mmol/L — ABNORMAL HIGH (ref 135–145)

## 2022-02-09 LAB — TROPONIN I (HIGH SENSITIVITY)
Troponin I (High Sensitivity): 269 ng/L (ref <18)
Troponin I (High Sensitivity): 387 ng/L (ref <18)

## 2022-02-09 LAB — MAGNESIUM: Magnesium: 2.5 mg/dL — ABNORMAL HIGH (ref 1.7–2.4)

## 2022-02-09 LAB — TRIGLYCERIDES: Triglycerides: 252 mg/dL — ABNORMAL HIGH (ref <150)

## 2022-02-09 LAB — PROCALCITONIN: Procalcitonin: 0.11 ng/mL

## 2022-02-09 MED ORDER — SODIUM CHLORIDE 0.9% FLUSH
10.0000 mL | INTRAVENOUS | Status: DC | PRN
  Administered 2022-02-21: 10 mL

## 2022-02-09 MED ORDER — ETOMIDATE 2 MG/ML IV SOLN
20.0000 mg | Freq: Once | INTRAVENOUS | Status: AC
  Administered 2022-02-09: 20 mg via INTRAVENOUS

## 2022-02-09 MED ORDER — INSULIN ASPART 100 UNIT/ML IJ SOLN
0.0000 [IU] | INTRAMUSCULAR | Status: DC
  Administered 2022-02-09 (×2): 3 [IU] via SUBCUTANEOUS
  Administered 2022-02-09: 2 [IU] via SUBCUTANEOUS
  Administered 2022-02-10 (×4): 3 [IU] via SUBCUTANEOUS
  Administered 2022-02-10: 08:00:00 5 [IU] via SUBCUTANEOUS
  Administered 2022-02-11 (×4): 3 [IU] via SUBCUTANEOUS
  Administered 2022-02-11: 5 [IU] via SUBCUTANEOUS
  Administered 2022-02-11: 17:00:00 3 [IU] via SUBCUTANEOUS
  Administered 2022-02-12: 05:00:00 2 [IU] via SUBCUTANEOUS
  Administered 2022-02-12 (×2): 3 [IU] via SUBCUTANEOUS
  Administered 2022-02-12: 2 [IU] via SUBCUTANEOUS
  Administered 2022-02-12: 01:00:00 3 [IU] via SUBCUTANEOUS
  Administered 2022-02-12: 2 [IU] via SUBCUTANEOUS
  Administered 2022-02-13 (×3): 3 [IU] via SUBCUTANEOUS
  Administered 2022-02-13: 05:00:00 5 [IU] via SUBCUTANEOUS
  Administered 2022-02-13 – 2022-02-14 (×2): 2 [IU] via SUBCUTANEOUS
  Administered 2022-02-14: 3 [IU] via SUBCUTANEOUS
  Administered 2022-02-14 (×2): 2 [IU] via SUBCUTANEOUS
  Administered 2022-02-15 (×2): 3 [IU] via SUBCUTANEOUS
  Administered 2022-02-15: 05:00:00 2 [IU] via SUBCUTANEOUS
  Administered 2022-02-15: 3 [IU] via SUBCUTANEOUS
  Administered 2022-02-15: 2 [IU] via SUBCUTANEOUS
  Administered 2022-02-16: 12:00:00 3 [IU] via SUBCUTANEOUS
  Administered 2022-02-16: 16:00:00 5 [IU] via SUBCUTANEOUS
  Administered 2022-02-16 (×2): 2 [IU] via SUBCUTANEOUS
  Administered 2022-02-16: 03:00:00 3 [IU] via SUBCUTANEOUS
  Administered 2022-02-16: 5 [IU] via SUBCUTANEOUS
  Administered 2022-02-17 (×2): 3 [IU] via SUBCUTANEOUS
  Administered 2022-02-17 (×2): 2 [IU] via SUBCUTANEOUS
  Administered 2022-02-17: 08:00:00 3 [IU] via SUBCUTANEOUS
  Administered 2022-02-17: 2 [IU] via SUBCUTANEOUS
  Administered 2022-02-18 (×2): 5 [IU] via SUBCUTANEOUS
  Administered 2022-02-18: 12:00:00 3 [IU] via SUBCUTANEOUS
  Administered 2022-02-18 (×2): 2 [IU] via SUBCUTANEOUS
  Administered 2022-02-19: 20:00:00 3 [IU] via SUBCUTANEOUS
  Administered 2022-02-19 (×3): 2 [IU] via SUBCUTANEOUS
  Administered 2022-02-19: 5 [IU] via SUBCUTANEOUS
  Administered 2022-02-19: 3 [IU] via SUBCUTANEOUS
  Administered 2022-02-20: 5 [IU] via SUBCUTANEOUS
  Administered 2022-02-20: 3 [IU] via SUBCUTANEOUS
  Administered 2022-02-20: 2 [IU] via SUBCUTANEOUS
  Administered 2022-02-20: 3 [IU] via SUBCUTANEOUS
  Administered 2022-02-20: 5 [IU] via SUBCUTANEOUS
  Administered 2022-02-20 – 2022-02-21 (×5): 3 [IU] via SUBCUTANEOUS
  Administered 2022-02-21: 5 [IU] via SUBCUTANEOUS
  Administered 2022-02-21: 2 [IU] via SUBCUTANEOUS
  Administered 2022-02-21 – 2022-02-22 (×3): 3 [IU] via SUBCUTANEOUS
  Administered 2022-02-22: 2 [IU] via SUBCUTANEOUS
  Administered 2022-02-22 (×2): 3 [IU] via SUBCUTANEOUS
  Administered 2022-02-23: 04:00:00 2 [IU] via SUBCUTANEOUS
  Administered 2022-02-23: 3 [IU] via SUBCUTANEOUS
  Administered 2022-02-23: 20:00:00 2 [IU] via SUBCUTANEOUS
  Administered 2022-02-23 (×3): 3 [IU] via SUBCUTANEOUS
  Administered 2022-02-23: 17:00:00 2 [IU] via SUBCUTANEOUS
  Administered 2022-02-24: 3 [IU] via SUBCUTANEOUS
  Administered 2022-02-24 (×3): 2 [IU] via SUBCUTANEOUS
  Administered 2022-02-25 (×2): 3 [IU] via SUBCUTANEOUS
  Administered 2022-02-25 (×2): 2 [IU] via SUBCUTANEOUS
  Administered 2022-02-25: 3 [IU] via SUBCUTANEOUS
  Administered 2022-02-25 – 2022-02-26 (×4): 2 [IU] via SUBCUTANEOUS
  Administered 2022-02-26: 3 [IU] via SUBCUTANEOUS
  Administered 2022-02-26: 2 [IU] via SUBCUTANEOUS
  Administered 2022-02-26: 3 [IU] via SUBCUTANEOUS
  Administered 2022-02-27 (×2): 2 [IU] via SUBCUTANEOUS
  Administered 2022-02-27: 3 [IU] via SUBCUTANEOUS
  Administered 2022-02-27 – 2022-02-28 (×8): 2 [IU] via SUBCUTANEOUS
  Administered 2022-03-01: 3 [IU] via SUBCUTANEOUS
  Administered 2022-03-01 – 2022-03-02 (×7): 2 [IU] via SUBCUTANEOUS
  Administered 2022-03-03 (×2): 3 [IU] via SUBCUTANEOUS
  Administered 2022-03-03 – 2022-03-07 (×13): 2 [IU] via SUBCUTANEOUS
  Administered 2022-03-08: 3 [IU] via SUBCUTANEOUS
  Administered 2022-03-08 – 2022-03-10 (×5): 2 [IU] via SUBCUTANEOUS
  Administered 2022-03-10 – 2022-03-11 (×6): 3 [IU] via SUBCUTANEOUS
  Administered 2022-03-11: 2 [IU] via SUBCUTANEOUS
  Administered 2022-03-11: 3 [IU] via SUBCUTANEOUS
  Administered 2022-03-11: 2 [IU] via SUBCUTANEOUS
  Administered 2022-03-11: 3 [IU] via SUBCUTANEOUS
  Administered 2022-03-11 – 2022-03-12 (×3): 2 [IU] via SUBCUTANEOUS
  Administered 2022-03-12 (×4): 3 [IU] via SUBCUTANEOUS
  Administered 2022-03-13: 2 [IU] via SUBCUTANEOUS
  Administered 2022-03-13 (×2): 3 [IU] via SUBCUTANEOUS
  Administered 2022-03-13 – 2022-03-14 (×3): 2 [IU] via SUBCUTANEOUS
  Administered 2022-03-14: 3 [IU] via SUBCUTANEOUS
  Administered 2022-03-14: 2 [IU] via SUBCUTANEOUS
  Administered 2022-03-14 (×2): 3 [IU] via SUBCUTANEOUS
  Administered 2022-03-14 – 2022-03-15 (×3): 2 [IU] via SUBCUTANEOUS
  Administered 2022-03-15: 3 [IU] via SUBCUTANEOUS
  Administered 2022-03-16 (×2): 2 [IU] via SUBCUTANEOUS
  Administered 2022-03-16: 3 [IU] via SUBCUTANEOUS
  Administered 2022-03-16: 2 [IU] via SUBCUTANEOUS
  Administered 2022-03-17: 3 [IU] via SUBCUTANEOUS
  Administered 2022-03-17: 2 [IU] via SUBCUTANEOUS
  Administered 2022-03-17 – 2022-03-18 (×3): 3 [IU] via SUBCUTANEOUS
  Administered 2022-03-18 – 2022-03-19 (×7): 2 [IU] via SUBCUTANEOUS
  Administered 2022-03-19: 3 [IU] via SUBCUTANEOUS
  Administered 2022-03-20 – 2022-03-21 (×7): 2 [IU] via SUBCUTANEOUS
  Administered 2022-03-22: 3 [IU] via SUBCUTANEOUS

## 2022-02-09 MED ORDER — MIDAZOLAM HCL 2 MG/2ML IJ SOLN
2.0000 mg | INTRAMUSCULAR | Status: DC | PRN
  Administered 2022-02-09 (×2): 2 mg via INTRAVENOUS
  Filled 2022-02-09: qty 2

## 2022-02-09 MED ORDER — HEPARIN SODIUM (PORCINE) 5000 UNIT/ML IJ SOLN
5000.0000 [IU] | Freq: Three times a day (TID) | INTRAMUSCULAR | Status: DC
Start: 2022-02-09 — End: 2022-02-22
  Administered 2022-02-09 – 2022-02-21 (×37): 5000 [IU] via SUBCUTANEOUS
  Filled 2022-02-09 (×37): qty 1

## 2022-02-09 MED ORDER — FENTANYL CITRATE PF 50 MCG/ML IJ SOSY
PREFILLED_SYRINGE | INTRAMUSCULAR | Status: AC
  Filled 2022-02-09: qty 2

## 2022-02-09 MED ORDER — HYDRALAZINE HCL 50 MG PO TABS
100.0000 mg | ORAL_TABLET | Freq: Four times a day (QID) | ORAL | Status: DC
  Administered 2022-02-09 – 2022-02-13 (×15): 100 mg
  Filled 2022-02-09 (×16): qty 2

## 2022-02-09 MED ORDER — MIDAZOLAM HCL 2 MG/2ML IJ SOLN
2.0000 mg | INTRAMUSCULAR | Status: DC | PRN
  Administered 2022-02-09: 2 mg via INTRAVENOUS
  Filled 2022-02-09: qty 2

## 2022-02-09 MED ORDER — VANCOMYCIN HCL IN DEXTROSE 1-5 GM/200ML-% IV SOLN: 1000.0000 mg | INTRAVENOUS | Status: DC

## 2022-02-09 MED ORDER — MIDAZOLAM BOLUS VIA INFUSION
0.0000 mg | INTRAVENOUS | Status: DC | PRN
  Administered 2022-02-13: 1 mg via INTRAVENOUS
  Filled 2022-02-09: qty 5

## 2022-02-09 MED ORDER — HYDRALAZINE HCL 20 MG/ML IJ SOLN
10.0000 mg | Freq: Once | INTRAMUSCULAR | Status: AC
  Administered 2022-02-09: 10 mg via INTRAVENOUS
  Filled 2022-02-09: qty 1

## 2022-02-09 MED ORDER — MIDAZOLAM HCL 2 MG/2ML IJ SOLN
2.0000 mg | Freq: Once | INTRAMUSCULAR | Status: AC
  Administered 2022-02-09: 2 mg via INTRAVENOUS
  Filled 2022-02-09: qty 2

## 2022-02-09 MED ORDER — MIDAZOLAM-SODIUM CHLORIDE 100-0.9 MG/100ML-% IV SOLN
0.0000 mg/h | INTRAVENOUS | Status: DC
  Administered 2022-02-09: 2 mg/h via INTRAVENOUS
  Administered 2022-02-10 – 2022-02-13 (×3): 3 mg/h via INTRAVENOUS
  Filled 2022-02-09 (×5): qty 100

## 2022-02-09 MED ORDER — SODIUM CHLORIDE 0.9 % IV SOLN
2.0000 g | Freq: Two times a day (BID) | INTRAVENOUS | Status: DC
  Administered 2022-02-09 – 2022-02-13 (×10): 2 g via INTRAVENOUS
  Filled 2022-02-09 (×10): qty 2

## 2022-02-09 MED ORDER — ROCURONIUM BROMIDE 10 MG/ML (PF) SYRINGE
PREFILLED_SYRINGE | INTRAVENOUS | Status: AC
  Filled 2022-02-09: qty 10

## 2022-02-09 MED ORDER — RACEPINEPHRINE HCL 2.25 % IN NEBU
INHALATION_SOLUTION | RESPIRATORY_TRACT | Status: AC
  Filled 2022-02-09: qty 0.5

## 2022-02-09 MED ORDER — MIDAZOLAM HCL 2 MG/2ML IJ SOLN
INTRAMUSCULAR | Status: AC
  Filled 2022-02-09: qty 2

## 2022-02-09 MED ORDER — SODIUM CHLORIDE 0.9% FLUSH
10.0000 mL | Freq: Two times a day (BID) | INTRAVENOUS | Status: DC
  Administered 2022-02-09 – 2022-02-10 (×3): 10 mL
  Administered 2022-02-10 – 2022-02-11 (×2): 20 mL
  Administered 2022-02-11 – 2022-02-14 (×6): 10 mL
  Administered 2022-02-15: 20 mL
  Administered 2022-02-15 – 2022-02-18 (×6): 10 mL
  Administered 2022-02-19: 30 mL
  Administered 2022-02-20 – 2022-02-23 (×6): 10 mL

## 2022-02-09 MED ORDER — VANCOMYCIN HCL 1500 MG/300ML IV SOLN
1500.0000 mg | Freq: Once | INTRAVENOUS | Status: AC
  Administered 2022-02-09: 1500 mg via INTRAVENOUS
  Filled 2022-02-09: qty 300

## 2022-02-09 MED ORDER — DEXAMETHASONE SODIUM PHOSPHATE 10 MG/ML IJ SOLN
10.0000 mg | Freq: Once | INTRAMUSCULAR | Status: AC
  Administered 2022-02-09: 10 mg via INTRAVENOUS

## 2022-02-09 MED ORDER — FENTANYL CITRATE PF 50 MCG/ML IJ SOSY
100.0000 ug | PREFILLED_SYRINGE | Freq: Once | INTRAMUSCULAR | Status: AC
  Administered 2022-02-09: 100 ug via INTRAVENOUS

## 2022-02-09 MED ORDER — ETOMIDATE 2 MG/ML IV SOLN
INTRAVENOUS | Status: AC
  Administered 2022-02-09: 20 mg
  Filled 2022-02-09: qty 20

## 2022-02-09 MED ORDER — GUAIFENESIN 200 MG PO TABS
200.0000 mg | ORAL_TABLET | Freq: Four times a day (QID) | ORAL | Status: DC
Start: 2022-02-09 — End: 2022-03-22
  Administered 2022-02-09 – 2022-03-22 (×157): 200 mg
  Filled 2022-02-09 (×167): qty 1

## 2022-02-09 NOTE — Progress Notes (Signed)
Dr. Mervyn Skeeters with CCM was notified at 11:05 that the patient was noted to have a cardiac pause lasting 5.53 sec. I expressed my concerns regarding the planned extubations. V/O was given to stop all sudation and prepare for extubation. Patient was agitated but following commands. Patient was extubated at 1130. Despite NT suctioning, racemic epi and O2 patient was unable to maintain air way and re-intubation was preformed. During the reintubation the patient was noted to have a 10 sec pause in the cardiac rhythm. Patient resumed sinus brady. Continued close monitoring. Family was notified and updated. Telephone consent for PICC line placement was confirmed by I Cabin crew and Ambulance person.

## 2022-02-09 NOTE — Progress Notes (Addendum)
eLink Physician-Brief Progress Note Patient Name: Sarah Myers DOB: [birth year] MRN: [id number]   Date of Service  02/09/2022  HPI/Events of Note  RN asking for right wrist soft restraint renewal  eICU Interventions  Renewed, re intubated earlier in day.      Intervention Category Minor Interventions: Agitation / anxiety - evaluation and management  Ranee Gosselin 02/09/2022, 8:52 PM  6:22 Hyperchloremic, hypernatremia, ongoing issues. Urine out put < 1 lit. Not looking like Diabetes insipidus not on 3% NS. Neurology on board.   ICH, re intubated yesterday  Discussed with RN. Neurology to follow through in AM. No free water at this time.

## 2022-02-09 NOTE — Progress Notes (Signed)
SLP Cancellation Note  Patient Details Name: Sarah Myers MRN: [id number] DOB: 1971-05-09   Cancelled treatment:       Reason Eval/Treat Not Completed: Medical issues which prohibited therapy. Pt reintubated. Will d/c orders   Javonte Elenes, Riley Nearing 02/09/2022, 12:44 PM

## 2022-02-09 NOTE — Progress Notes (Addendum)
STROKE TEAM PROGRESS NOTE   SUBJECTIVE (INTERVAL HISTORY)  Patient is seen alone at the bedside.  Overall her condition is gradually improving. Patient is alert on assessment and following most commands. Patient had 3-second pauses in HR last night, so Labetalol was d/c for SBP >150, and Hydralazine inc to 100mg  q6h. Blood pressure has been stable this AM, and patient's sodium has maintained around 155-156 range on NS.    Repeat MRI with some extension of edema and minimal extension of ICH.  OBJECTIVE CBC:  Recent Labs  Lab 02/06/22 1205 02/07/22 0320 02/08/22 0636 02/09/22 0634  WBC 13.8* 14.1* 12.1* 13.9*  NEUTROABS 12.1* 12.7*  --   --   HGB 11.5* 11.3* 11.5* 12.0  HCT 35.5* 35.0* 36.0 37.2  MCV 87.0 89.5 89.6 88.4  PLT 198 200 179 [id number]    Basic Metabolic Panel:  Recent Labs  Lab 02/08/22 0636 02/08/22 1407 02/08/22 1909 02/08/22 2335 02/09/22 0118 02/09/22 0634  NA 155*   156*   < >  --    < > 156* 155*  K 4.1  --   --   --   --  4.3  CL >130*  --   --   --   --  129*  CO2 16*  --   --   --   --  16*  GLUCOSE 158*  --   --   --   --  218*  BUN 38*  --   --   --   --  37*  CREATININE 1.37*  --   --   --   --  1.29*  CALCIUM 9.2  --   --   --   --  9.3  MG 2.1  --  2.4  --   --  2.5*  PHOS 3.2  --  3.1  --   --   --    < > = values in this interval not displayed.    Lipid Panel:  Recent Labs  Lab 02/06/22 0432 02/09/22 0634  CHOL 146  --   TRIG 180* 252*  HDL 46  --   CHOLHDL 3.2  --   VLDL 36  --   LDLCALC 64  --     HgbA1c:  Recent Labs  Lab 02/06/22 0432  HGBA1C 5.6    Urine Drug Screen:  Recent Labs  Lab 02/05/22 1338  LABOPIA NONE DETECTED  COCAINSCRNUR NONE DETECTED  LABBENZ NONE DETECTED  AMPHETMU NONE DETECTED  THCU NONE DETECTED  LABBARB NONE DETECTED     Alcohol Level No results for input(s): ETH in the last 168 hours.  IMAGING past 24 hours US RENAL  Result Date: 02/08/2022 CLINICAL DATA:  Oliguria EXAM: RENAL / URINARY  TRACT ULTRASOUND COMPLETE COMPARISON:  CT done on 02/10/2012 FINDINGS: Right Kidney: Renal measurements: 11.3 x 3.1 x 5.5 cm = volume: 96.9 mL. Echogenicity within normal limits. No mass or hydronephrosis visualized. Left Kidney: Renal measurements: 10.5 x 4.4 x 6.2 cm = volume: 149.2 mL. Echogenicity within normal limits. No mass or hydronephrosis visualized. Bladder: Urinary bladder is empty with indwelling Foley catheter limiting evaluation. Other: Incidental note is made of fatty infiltration in the liver. IMPRESSION: No significant sonographic abnormality is seen in the kidneys. Electronically Signed   By: Elmer Picker M.D.   On: 02/08/2022 13:37     PHYSICAL EXAM Temp:  [99.2 F (37.3 C)-100.7 F (38.2 C)] 100.1 F (37.8 C) (02/23 0800) Pulse Rate:  [56-80] 71 (  02/23 0759) Resp:  [11-25] 25 (02/23 0738) BP: (122-202)/(63-100) 138/98 (02/23 0759) SpO2:  [92 %-98 %] 97 % (02/23 0738) FiO2 (%):  [40 %] 40 % (02/23 0738)  General - Well nourished, well developed, woman intubated but alert  Cardiovascular - Regular rhythm and rate on telemetry. No longer bradycardic with stimulation.  Mental Status -  Patient alert and following R sided commands; intubated Comprehension intact as patient can give thumbs up, show two fingers, and stick out tongue.  Cranial Nerves II - XII - II - Pupils pinpoint and sluggish. Does not blink to threat in either eye. EOM to R intact on command, able to cross midline for L gaze today III, IV, VI - Dysconjugate gaze significantly improved.  VIII - Hearing appears to be intact bilaterally, as patient follows commands.  X - Gag and cough reflex intact  Motor Strength - Patient withdraws to pain on R side in both extremities, less in RLE today. No withdrawal to pain or meaningful movements in L sided extremities.   Plantar Reflexes: Negative Babinski bilaterally.  Gait and Station - deferred.    ASSESSMENT/PLAN Sarah Myers is a 51 y.o.  female with history of HTN presenting with AMS, L sided weakness, and slurred speech.   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 Intra-axial blood volume slightly greater than 33 mL. Mass effect with 4-5 mm leftward midline shift; superimposed chronic meningocele progressing since 2016.  Repeat CTH at 0045 with 36 mL hemorrhagic area, and 0835 repeat unchanged. CTA head & neck No large vessel occlusion, intracranial aneurysm, or abnormal vasculature associated with hemorrhage. 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 CT repeat 2/20 stable hematoma and MLS MRI with some extension of edema and stable ICH. 2D Echo LVEF 65-70%, small pericardial effusion present; trival MVR LDL 64 HgbA1c 5.6 VTE prophylaxis - SCDs No antithrombotic prior to admission, now on No antithrombotic.  Therapy recommendations:  Pending PT/OT/SLP eval when patient more stable Disposition:  Pending   Cerebral edema CT head showed midline shift 5 mm;  Repeat CT stable hematoma and midline shift MRI with some extension of edema and stable ICH, intraventricular extension noted. On saline 75->50->NS 24 hr Na stable 155-157 Na monitoring  Hypertensive emergency SBP as high as 250 on presentation Home meds:  Coreg 25 mg BID, HCTZ 25 Now off Cleviprex Continue Norvasc 10 mg and Clonidine 0.1mg  TID Increased to Hydralazine 100 mg q6h  Due to inc Cr, d/c'd Losartan; Coreg dec to 3.125 mg BID per critical care team  BP goal less than 160 Long-term BP goal normotensive  Elevated Cr/ AKI Cr inc from 0.85->1.58->1.29->1.37 D/c Losartan BMP monitoring Increase IVF to 50 cc/hr  Elevated Troponin Downtrending 387-269; EKG with sinus rhythm, PACs, and nonspecific ST wave abnormality, possible inferiolateral ischemia versus 2/2 BP derangements and  intubation. Continue to monitor troponin level  Respiratory failure Intubated in the ED for airway protection Agitation on weaning this am Switched sedation from Propofol to Precedex per Critical Care team Possible extubation today  Other Stroke Risk Factors Family hx stroke (Maternal Uncle)  Other Active Problems Leukocytosis WBC 14.1->12.1-> 13.9, UA negative; Tracheal aspirate culture in process Pressure ulcer of L hand  Hospital day # 4  Rosezetta Schlatter, MD Stroke Neurology- Neuro Psych Resident 02/09/2022 8:44 AM  ATTENDING NOTE: I reviewed above note and agree  with the assessment and plan. Pt was seen and examined.   RN at bedside.  No family at bedside.  Patient still intubated, however open eyes on voice, more awake alert, follows simple commands on the right hand, and possibly on the right foot.  Still has pinpoint pupils, left hemiplegia, mild withdraw to pain on the right upper and lower extremities.  Currently on Precedex, off Cleviprex, on low-dose fentanyl.  Per CCM Dr. Lynetta Mare, plan to do extubation today.  BP stable, with occasional spike, on labetalol as needed.  Had low-grade fever, tracheal aspirate culture pending, put on cefepime and vancomycin.  Creatinine stable and mildly improved.  For detailed assessment and plan, please refer to above as I have made changes wherever appropriate.   Rosalin Hawking, MD PhD Stroke Neurology 02/09/2022 6:38 PM  This patient is critically ill due to large right BG ICH, cerebral edema, respiratory failure, fever, AKI and at significant risk of neurological worsening, death form brain herniation, hypertensive encephalopathy, sepsis, seizure. This patient's care requires constant monitoring of vital signs, hemodynamics, respiratory and cardiac monitoring, review of multiple databases, neurological assessment, discussion with family, other specialists and medical decision making of high complexity. I spent 40 minutes of neurocritical  care time in the care of this patient.    To contact Stroke Continuity provider, please refer to [email or web address] After hours, contact General Neurology

## 2022-02-09 NOTE — Progress Notes (Signed)
° °  Echocardiogram 2D Echocardiogram has been performed.  Festus Barren 02/09/2022, 9:57 AM

## 2022-02-09 NOTE — Progress Notes (Signed)
Peripherally Inserted Central Catheter Placement  The IV Nurse has discussed with the patient and/or persons authorized to consent for the patient, the purpose of this procedure and the potential benefits and risks involved with this procedure.  The benefits include less needle sticks, lab draws from the catheter, and the patient may be discharged home with the catheter. Risks include, but not limited to, infection, bleeding, blood clot (thrombus formation), and puncture of an artery; nerve damage and irregular heartbeat and possibility to perform a PICC exchange if needed/ordered by physician.  Alternatives to this procedure were also discussed.  Bard Power PICC patient education guide, fact sheet on infection prevention and patient information card has been provided to patient /or left at bedside.    PICC Placement Documentation  PICC Triple Lumen 02/09/22 Right Brachial 35 cm 0 cm (Active)  Indication for Insertion or Continuance of Line Prolonged intravenous therapies 02/09/22 1437  Exposed Catheter (cm) 0 cm 02/09/22 1437  Site Assessment Clean, Dry, Intact 02/09/22 1437  Lumen #1 Status Flushed;Saline locked;Blood return noted 02/09/22 1437  Lumen #2 Status Blood return noted;Saline locked;Flushed 02/09/22 1437  Lumen #3 Status Blood return noted;Saline locked;Flushed 02/09/22 1437  Dressing Type Securing device;Transparent 02/09/22 1437  Dressing Status Antimicrobial disc in place 02/09/22 1437  Dressing Intervention New dressing;Other (Comment) 02/09/22 1437  Dressing Change Due 02/16/22 02/09/22 1437   Telephone consent signed by Daughter     Sarah Myers 02/09/2022, 2:40 PM

## 2022-02-09 NOTE — Progress Notes (Signed)
Date and time results received: 02/09/22 0040  Critical Value: Troponin 387  Name of Provider Notified: E-link

## 2022-02-09 NOTE — Progress Notes (Signed)
eLink Physician-Brief Progress Note Patient Name: Sarah Myers DOB: 1971/02/15 MRN: [id number]   Date of Service  02/09/2022  HPI/Events of Note  Troponin #1 = 387. EKG documented in my last note c/w inferolateral ischemia. Difficult clinical situation. Not able to use ASA or Heparin IV infusion and perhaps not able to do any PCI d/t large BG ICH and not able to increase B-Blocker dose d/t bradycardia and pauses.   eICU Interventions  Plan: Cardiac Echo in AM PCCM rounding team to consider cardiology consult in AM.     Intervention Category Major Interventions: Other:  Lenell Antu 02/09/2022, 12:47 AM

## 2022-02-09 NOTE — Procedures (Signed)
Intubation Procedure Note  RIO ROOKE  SV:[id number]  August 19, 1971  Date:02/09/22  Time:12:10 PM   Provider Performing:Jayson Waterhouse D Rollene Rotunda    Procedure: Intubation ([id number])  Indication(s) Respiratory Failure  Consent Unable to obtain consent due to emergent nature of procedure.   Anesthesia Etomidate, Versed, and Fentanyl   Time Out Verified patient identification, verified procedure, site/side was marked, verified correct patient position, special equipment/implants available, medications/allergies/relevant history reviewed, required imaging and test results available.   Sterile Technique Usual hand hygeine, masks, and gloves were used   Procedure Description Patient positioned in bed supine.  Sedation given as noted above.  Patient was intubated with endotracheal tube using Glidescope.  View was Grade 2 only posterior commissure .  Number of attempts was 1.  Colorimetric CO2 detector was consistent with tracheal placement.   Complications/Tolerance None; patient tolerated the procedure well. Chest X-ray is ordered to verify placement.   EBL Minimal   Specimen(s) None  JD Rexene Agent [facility] 02/09/2022, 12:11 PM  Please see [email or web address] for pager details.  From 7A-7P if no response, please call [phone]. After hours, please call ELink [phone].

## 2022-02-09 NOTE — Progress Notes (Signed)
Pharmacy Antibiotic Note  Sarah Myers is a 51 y.o. female admitted on 02/05/2022 as code stroke, currently intubated, increased secretions, fevers and concern for VAP.  Pharmacy has been consulted for cefepime and vancomycin dosing.  Plan: Vancomycin 1500 mg  IV x 1, then 1000mg  q 24h (eAUC 439, Goal AUC 400-550, SCr 1.29) Cefepime 2g IV q 12 hours Monitor renal function, Cx/PCR to narrow Vancomycin levels as need  Height: 5\' 4"  (162.6 cm) Weight: 74.8 kg (165 lb) IBW/kg (Calculated) : 54.7  Temp (24hrs), Avg:99.9 F (37.7 C), Min:99.2 F (37.3 C), Max:100.7 F (38.2 C)  Recent Labs  Lab 02/05/22 0915 02/06/22 0432 02/06/22 1205 02/07/22 0320 02/08/22 0636 02/09/22 0634  WBC  --  11.8* 13.8* 14.1* 12.1* 13.9*  CREATININE 0.85 1.58*  --  1.29* 1.37* 1.29*    Estimated Creatinine Clearance: 52.2 mL/min (A) (by C-G formula based on SCr of 1.29 mg/dL (H)).    Allergies  Allergen Reactions   Lisinopril Cough    02/10/22, PharmD Clinical Pharmacist ED Pharmacist Phone # [phone] 02/09/2022 11:45 AM

## 2022-02-09 NOTE — Progress Notes (Signed)
eLink Physician-Brief Progress Note Patient Name: Sarah Myers DOB: 05/09/1971 MRN: [id number]   Date of Service  02/09/2022  HPI/Events of Note  Hypertension - BP = 165/84 with MAP = 105.  eICU Interventions  Plan: Hydralazine 10 mg IV X 1. Increase Hydralazine to 100 mg per tube Q 6 hours.     Intervention Category Major Interventions: Hypertension - evaluation and management  Sarah Myers 02/09/2022, 6:10 AM

## 2022-02-09 NOTE — Progress Notes (Signed)
SBP>150, labetalol held due to bradycardia and pauses throughout the night, give 0600 hydralazine now per Dr. Oletta Darter. Will continue to monitor.

## 2022-02-09 NOTE — Progress Notes (Signed)
Patient extubated and PCCM called to bedside. Patient complaining that she can't breathe. Unable to speak. Thick oral secretions appreciated. Possible stridor appreciatedRT attempted to NT suction, decadron, and racemic epi given without any relief. Patient RR in 27s and worsening. PCCM reintubated patient.  JD Rexene Agent [facility] 02/09/2022, 12:22 PM  Please see [email or web address] for pager details.  From 7A-7P if no response, please call [phone]. After hours, please call ELink [phone].

## 2022-02-09 NOTE — Progress Notes (Addendum)
NAME:  Sarah Myers, MRN:  [id number], DOB:  1971/05/04, LOS: 4 ADMISSION DATE:  02/05/2022, CONSULTATION DATE:  02/05/22 REFERRING MD:  Selina Cooley - neuro , CHIEF COMPLAINT:  Mechanically Ventilated  // ICH    History of Present Illness:  51 yo F PMH HTN presented to [facility] 2/19 with CC AMS. LKW 2100 2/18, at 0600 2/19 found to have L sided weakness, slurred speech. Evaluated as code stroke and found to have R side ICH with mass effect (brain compression) present on admission.  Intubated in ED for airway protection. Started on 3% saline, cleviprex. Admitted to Neuro team  PCCM consulted in this setting to assist with medical management   Na 154 ( Goal 150-155) , K 4.1, Creatinine 1.58, ( 0.85) BUN 24,( 16) Chloride 126  Serum CBC- WBC 11.8 ( 15.4)  hgb 12.1 ( 16.0>> 13.3>>12.6>>12.1) HCT  35.4 Net + 3.1  L   Pertinent  Medical History  HTN  Significant Hospital Events: Including procedures, antibiotic start and stop dates in addition to other pertinent events   2/19 admitted to neuro with acute R ICH. Intubated for airway protection. Started on 3% and  cleviprex. PCCM consult. NSGY consult  02/06/2022 For repeat CT Head >> Overnight CT showed increase in size of bleed to 36 ml , but unchanged midline shift at 4 mm . Repeating CT head again 2/20 am. Neurosurgery is aware 2/21: 3% saline stopped due to Na 158. weaned off cleviprex. Remains intubated. 2/22: neuro exam much improved (moves RUE and RLE to command; thumbs up; blinks eyes to command; sticks tongue out); sinus pause overnight w/ inferiolateral ST elevation overnight; troponin 387 then 269 2/23 on SBT this am; echo pending  Interim History / Subjective:  Overnight had 3 second pauses; HR 37 to 54 w/ pauses. EKG w/ ST and T wave abnormality; possible inferolateral ischemia. Troponin 387 then 269. Echo pending this am. Consider cards consult.  Intubated on mech vent; SBT in progress On precedex/fent Neuro: moves RUE and RLE to  command; thumbs up; blinks eyes to command; sticks tongue out UOP improving today 980 mL (from 500 mL yesterday) output last 24 hours.   Objective   Blood pressure 122/69, pulse 67, temperature 100.2 F (37.9 C), temperature source Axillary, resp. rate 18, height 5\' 4"  (1.626 m), weight 74.8 kg, SpO2 97 %. CVP:  [13 mmHg-26 mmHg] 19 mmHg  Vent Mode: PRVC FiO2 (%):  [40 %] 40 % Set Rate:  [18 bmp-24 bmp] 18 bmp Vt Set:  [430 mL] 430 mL PEEP:  [5 cmH20] 5 cmH20 Pressure Support:  [8 cmH20] 8 cmH20 Plateau Pressure:  [14 cmH20-21 cmH20] 14 cmH20   Intake/Output Summary (Last 24 hours) at 02/09/2022 02/11/2022 Last data filed at 02/09/2022 0600 Gross per 24 hour  Intake 1535.79 ml  Output 980 ml  Net 555.79 ml    Filed Weights   02/05/22 0913  Weight: 74.8 kg    Examination: General: critically ill appearing on mech vent HEENT: MM pink/moist; ETT in place Neuro: Sedated w/ precedex/fent; moves RUE and RLE to command; thumbs up; blinks eyes to command; sticks tongue out; pupils pin point CV: s1s2, no m/r/g PULM:  dim clear BS bilaterally; on mech vent SBT GI: soft, bsx4 active  Extremities: warm/dry, trace UE > LE edema  Skin: no rashes or lesions appreciated  Resolved Hospital Problem list     Assessment & Plan:   Acute respiratory insufficiency requiring intubation due to intracranial process  P: -  cont mech vent -SBT this am; consider extubation -increase secretions this am; WBC/fever curve trending up -guaifenesin and cpt ordered -cxr this am pending -sending resp culture, mrsa pcr, and check procalc; consider antibiotics -wean fio2 for sats >92% -vap prevention in place  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 -- " Brain Compression" -- present on admission  Incidental finding:  proximal L PCA severe stenosis vs occlusion  Increase in bleed overnight , but no increase in shift Neuro status decline overnight  P: -neurosurgery  following; appreciate recs -Na 150-155; continue to let NA trend down without treatment -SBP goal <160 per neuro; continue BP meds -continue steroids -frequent neuro checks  Hypertensive emergency Underlying uncontrolled HTN Episodes of bradycardia overnight 2/20: coreg dose decreased -home coreg and HCTZ Sinus pause episode 2/23 w/ possible inferolateral ST elevation P: -SBP goal <160 -telemetry monitoring -decreased dose of coreg -echo pending -consider cards consult; unable to do anticoagulation due to ICH -cont hydralazine, clonidine, and norvasc  AKI -Suspect baseline BP was much higher, and renal function may reflect controlled BP Poor UOP -Renal US unremarkable P: -UOP slowly improving -Trend BMP / urinary output -Replace electrolytes as indicated -Avoid nephrotoxic agents, ensure adequate renal perfusion  Hyperglycemia -a1c 5.6 2/20 -likely steroid use related P: -ssi increased  -cbg monitoring  Anemia -HGB drop from HGB has dropped from 16 on 2/19 to 12.1 on 2/20.  ? If hemodilutional ( + 3 L last 24 hours )  P: -trend cbc -Transfuse for HGB < 7  Hypokalemia P: -trend bmp/mag -replete as needed  Leukocytosis - suspect reactive 2/2 ICH; trending down P: -trend wbc/fever curve   Best Practice (right click and "Reselect all SmartList Selections" daily)   Diet/type: tubefeeds DVT prophylaxis: SCD GI prophylaxis: PPI Lines: N/A Foley:  Yes, and it is still needed Code Status:  full code Last date of multidisciplinary goals of care discussion [per primary]  Critical care time: 35 min     JD Anselm Lis [facility] [facility] 02/09/2022, 7:14 AM  Please see [email or web address] for pager details.  From 7A-7P if no response, please call [phone]. After hours, please call ELink [phone].

## 2022-02-10 ENCOUNTER — Inpatient Hospital Stay (HOSPITAL_COMMUNITY): Payer: 59

## 2022-02-10 DIAGNOSIS — Z978 Presence of other specified devices: Secondary | ICD-10-CM

## 2022-02-10 LAB — CBC
HCT: 35.6 % — ABNORMAL LOW (ref 36.0–46.0)
Hemoglobin: 10.6 g/dL — ABNORMAL LOW (ref 12.0–15.0)
MCH: 27.8 pg (ref 26.0–34.0)
MCHC: 29.8 g/dL — ABNORMAL LOW (ref 30.0–36.0)
MCV: 93.4 fL (ref 80.0–100.0)
Platelets: 136 10*3/uL — ABNORMAL LOW (ref 150–400)
RBC: 3.81 MIL/uL — ABNORMAL LOW (ref 3.87–5.11)
RDW: 16.6 % — ABNORMAL HIGH (ref 11.5–15.5)
WBC: 12.4 10*3/uL — ABNORMAL HIGH (ref 4.0–10.5)
nRBC: 0.2 % (ref 0.0–0.2)

## 2022-02-10 LAB — GLUCOSE, CAPILLARY
Glucose-Capillary: 183 mg/dL — ABNORMAL HIGH (ref 70–99)
Glucose-Capillary: 204 mg/dL — ABNORMAL HIGH (ref 70–99)
Glucose-Capillary: 130 mg/dL — ABNORMAL HIGH (ref 70–99)
Glucose-Capillary: 187 mg/dL — ABNORMAL HIGH (ref 70–99)
Glucose-Capillary: 181 mg/dL — ABNORMAL HIGH (ref 70–99)

## 2022-02-10 LAB — BASIC METABOLIC PANEL
Anion gap: 8 (ref 5–15)
BUN: 39 mg/dL — ABNORMAL HIGH (ref 6–20)
BUN: 43 mg/dL — ABNORMAL HIGH (ref 6–20)
CO2: 19 mmol/L — ABNORMAL LOW (ref 22–32)
CO2: 22 mmol/L (ref 22–32)
Calcium: 8.7 mg/dL — ABNORMAL LOW (ref 8.9–10.3)
Calcium: 9.5 mg/dL (ref 8.9–10.3)
Chloride: >130 mmol/L (ref 98–111)
Chloride: 123 mmol/L — ABNORMAL HIGH (ref 98–111)
Creatinine, Ser: 1.14 mg/dL — ABNORMAL HIGH (ref 0.44–1.00)
Creatinine, Ser: 1.25 mg/dL — ABNORMAL HIGH (ref 0.44–1.00)
GFR, Estimated: 59 mL/min — ABNORMAL LOW (ref >60)
GFR, Estimated: 53 mL/min — ABNORMAL LOW (ref >60)
Glucose, Bld: 200 mg/dL — ABNORMAL HIGH (ref 70–99)
Glucose, Bld: 195 mg/dL — ABNORMAL HIGH (ref 70–99)
Potassium: 4.2 mmol/L (ref 3.5–5.1)
Potassium: 4 mmol/L (ref 3.5–5.1)
Sodium: 155 mmol/L — ABNORMAL HIGH (ref 135–145)
Sodium: 153 mmol/L — ABNORMAL HIGH (ref 135–145)

## 2022-02-10 LAB — SODIUM
Sodium: 155 mmol/L — ABNORMAL HIGH (ref 135–145)
Sodium: 154 mmol/L — ABNORMAL HIGH (ref 135–145)
Sodium: 155 mmol/L — ABNORMAL HIGH (ref 135–145)

## 2022-02-10 MED ORDER — METOLAZONE 2.5 MG PO TABS
2.5000 mg | ORAL_TABLET | Freq: Once | ORAL | Status: AC
  Administered 2022-02-10: 2.5 mg
  Filled 2022-02-10: qty 1

## 2022-02-10 MED ORDER — FUROSEMIDE 10 MG/ML IJ SOLN
80.0000 mg | Freq: Once | INTRAMUSCULAR | Status: AC
  Administered 2022-02-10: 80 mg via INTRAVENOUS
  Filled 2022-02-10: qty 8

## 2022-02-10 NOTE — Progress Notes (Signed)
Pt transported to CT and back to 0000000 without complications. RT will continue to monitor.

## 2022-02-10 NOTE — Progress Notes (Addendum)
STROKE TEAM PROGRESS NOTE   SUBJECTIVE (INTERVAL HISTORY)  Patient is seen with daughter and pastors at the bedside.  Overall her condition is gradually improving. Attempted extubation yesterday and patient had post-extubation stridor leading to re-intubation. Cords nonedematous, so will re-attempt extubation when feasible prior to considering tracheostomy.  Patient is lethargic on assessment 2/2 sedation but opens eyes to stimulation and is following some commands. Patient had a 5-second and a 10 second pause in HR last night during times of agitation (reintubation) that Critical Care team believes to be vasovagal in nature. Blood pressure has been stable this AM, and patient's sodium has maintained around 153-156 range on NS.    Repeat MRI with some extension of edema and minimal extension of ICH. Will repeat CTH today to assess for progress.  OBJECTIVE CBC:  Recent Labs  Lab 02/06/22 1205 02/07/22 0320 02/08/22 0636 02/09/22 0634 02/10/22 0518  WBC 13.8* 14.1*   < > 13.9* 12.4*  NEUTROABS 12.1* 12.7*  --   --   --   HGB 11.5* 11.3*   < > 12.0 10.6*  HCT 35.5* 35.0*   < > 37.2 35.6*  MCV 87.0 89.5   < > 88.4 93.4  PLT 198 200   < > 155 136*   < > = values in this interval not displayed.    Basic Metabolic Panel:  Recent Labs  Lab 02/08/22 0636 02/08/22 1407 02/08/22 1909 02/08/22 2335 02/09/22 0634 02/09/22 0806 02/10/22 0203 02/10/22 0518  NA 155*   156*   < >  --    < > 155*   < > 155* 155*  K 4.1  --   --   --  4.3  --   --  4.2  CL >130*  --   --   --  129*  --   --  >130*  CO2 16*  --   --   --  16*  --   --  19*  GLUCOSE 158*  --   --   --  218*  --   --  200*  BUN 38*  --   --   --  37*  --   --  39*  CREATININE 1.37*  --   --   --  1.29*  --   --  1.14*  CALCIUM 9.2  --   --   --  9.3  --   --  8.7*  MG 2.1  --  2.4  --  2.5*  --   --   --   PHOS 3.2  --  3.1  --   --   --   --   --    < > = values in this interval not displayed.    Lipid Panel:  Recent  Labs  Lab 02/06/22 0432 02/09/22 0634  CHOL 146  --   TRIG 180* 252*  HDL 46  --   CHOLHDL 3.2  --   VLDL 36  --   LDLCALC 64  --     HgbA1c:  Recent Labs  Lab 02/06/22 0432  HGBA1C 5.6    Urine Drug Screen:  Recent Labs  Lab 02/05/22 1338  LABOPIA NONE DETECTED  COCAINSCRNUR NONE DETECTED  LABBENZ NONE DETECTED  AMPHETMU NONE DETECTED  THCU NONE DETECTED  LABBARB NONE DETECTED     Alcohol Level No results for input(s): ETH in the last 168 hours.  IMAGING past 24 hours DG Abd 1 View  Result Date:  02/09/2022 CLINICAL DATA:  Feeding tube placement EXAM: ABDOMEN - 1 VIEW COMPARISON:  None. FINDINGS: NG tube enters the stomach with the tip in the mid body of the stomach. Normal bowel gas pattern. IMPRESSION: NG tip in the body the stomach. Electronically Signed   By: Marlan Palau M.D.   On: 02/09/2022 12:56   DG Chest Port 1 View  Result Date: 02/09/2022 CLINICAL DATA:  Endotracheal tube placement EXAM: PORTABLE CHEST 1 VIEW COMPARISON:  02/09/2022 FINDINGS: Endotracheal tube remains in good position. NG tube enters the stomach with the tip not visualized. Left neck central venous catheter tip overlies the aortic arch unchanged. It is unclear if this is arterial or venous. Correlate with blood gas. Bibasilar airspace disease left greater than right unchanged. No pneumothorax. Possible small pleural effusions bilaterally. IMPRESSION: Endotracheal tube remains in good position Left neck catheter overlying the aortic arch. It is unclear if this is arterial or venous. Correlate with blood gas. Bibasilar airspace disease left greater than right unchanged. Electronically Signed   By: Marlan Palau M.D.   On: 02/09/2022 12:55   DG Chest Port 1 View  Result Date: 02/09/2022 CLINICAL DATA:  Acute respiratory failure with hypoxia. Endotracheal tube placement. EXAM: PORTABLE CHEST 1 VIEW COMPARISON:  Chest radiograph 02/07/2022 FINDINGS: Endotracheal tube tip projects approximately  2.8 cm above the carina. Left IJ central venous catheter tip projects at the superior aspect of the aortic arch, slightly retracted compared to prior exam on 02/07/2022. Enteric tube tip is below the diaphragm but not included on the image. Low lung volumes with slightly increased retrocardiac opacity in the left lower lobe and mild interstitial thickening in the right lower lobe. Probable trace left pleural fluid. No pneumothorax. Stable cardiomediastinal silhouette. No acute osseous abnormality. IMPRESSION: Left IJ central venous catheter tip projects at the superior aspect of the aortic arch, slightly retracted compared to prior exam on 02/07/2022. As previously documented, lateral chest radiograph or CT would be helpful to further clarify placement of catheter tip. Slight interval worsening of retrocardiac left lower lobe airspace opacity and right lower lobe interstitial opacities, concerning for multifocal pneumonia. Electronically Signed   By: Sherron Ales M.D.   On: 02/09/2022 08:58   ECHOCARDIOGRAM COMPLETE BUBBLE STUDY  Result Date: 02/09/2022    ECHOCARDIOGRAM REPORT   [name] Date of Exam: 02/09/2022 Medical Rec #:  [id number]           Height:       64.0 in Accession #:    [id number]          Weight:       165.0 lb Date of Birth:  [birth year]            BSA:          1.803 m Patient Age:    50 years            BP:           140/76 mmHg Patient Gender: F                   HR:           66 bpm. Exam Location:  Inpatient Procedure: 2D Echo, Cardiac Doppler and Color Doppler Indications:    ELEV. TROPONIN  History:        Patient has prior history of Echocardiogram examinations, most                 recent  02/06/2022. Risk Factors:Hypertension.  Sonographer:    Festus Barren Referring Phys: [id number] STEVEN E SOMMER IMPRESSIONS  1. Left ventricular ejection fraction, by estimation, is >75%. The left ventricle has hyperdynamic function. The left ventricle has no regional wall motion  abnormalities. There is severe left ventricular hypertrophy. Left ventricular diastolic parameters are indeterminate.  2. Right ventricular systolic function is normal. The right ventricular size is normal.  3. The mitral valve is normal in structure. Trivial mitral valve regurgitation. No evidence of mitral stenosis.  4. The aortic valve is tricuspid. Aortic valve regurgitation is not visualized. No aortic stenosis is present.  5. The inferior vena cava is normal in size with greater than 50% respiratory variability, suggesting right atrial pressure of 3 mmHg. FINDINGS  Left Ventricle: Left ventricular ejection fraction, by estimation, is >75%. The left ventricle has hyperdynamic function. The left ventricle has no regional wall motion abnormalities. The left ventricular internal cavity size was normal in size. There is severe left ventricular hypertrophy. Left ventricular diastolic parameters are indeterminate. Right Ventricle: The right ventricular size is normal. Right ventricular systolic function is normal. Left Atrium: Left atrial size was normal in size. Right Atrium: Right atrial size was normal in size. Pericardium: Trivial pericardial effusion is present. Mitral Valve: The mitral valve is normal in structure. Trivial mitral valve regurgitation. No evidence of mitral valve stenosis. Tricuspid Valve: The tricuspid valve is normal in structure. Tricuspid valve regurgitation is trivial. No evidence of tricuspid stenosis. Aortic Valve: The aortic valve is tricuspid. Aortic valve regurgitation is not visualized. No aortic stenosis is present. Aortic valve mean gradient measures 6.0 mmHg. Aortic valve peak gradient measures 11.7 mmHg. Aortic valve area, by VTI measures 2.12  cm. Pulmonic Valve: The pulmonic valve was normal in structure. Pulmonic valve regurgitation is not visualized. No evidence of pulmonic stenosis. Aorta: The aortic root is normal in size and structure. Venous: The inferior vena cava is  normal in size with greater than 50% respiratory variability, suggesting right atrial pressure of 3 mmHg. IAS/Shunts: No atrial level shunt detected by color flow Doppler.  LEFT VENTRICLE PLAX 2D LVIDd:         4.60 cm     Diastology LVIDs:         2.80 cm     LV e' medial:    9.79 cm/s LV PW:         1.20 cm     LV E/e' medial:  9.6 LV IVS:        1.30 cm     LV e' lateral:   7.23 cm/s LVOT diam:     1.90 cm     LV E/e' lateral: 13.0 LV SV:         61 LV SV Index:   34 LVOT Area:     2.84 cm  LV Volumes (MOD) LV vol d, MOD A2C: 73.3 ml LV vol d, MOD A4C: 46.5 ml LV vol s, MOD A2C: 28.2 ml LV vol s, MOD A4C: 17.0 ml LV SV MOD A2C:     45.1 ml LV SV MOD A4C:     46.5 ml LV SV MOD BP:      36.7 ml RIGHT VENTRICLE             IVC RV S prime:     19.00 cm/s  IVC diam: 2.00 cm TAPSE (M-mode): 1.7 cm LEFT ATRIUM             Index  RIGHT ATRIUM           Index LA diam:        3.20 cm 1.78 cm/m   RA Area:     10.60 cm LA Vol (A2C):   53.3 ml 29.57 ml/m  RA Volume:   21.20 ml  11.76 ml/m LA Vol (A4C):   37.0 ml 20.52 ml/m LA Biplane Vol: 46.0 ml 25.52 ml/m  AORTIC VALVE                     PULMONIC VALVE AV Area (Vmax):    2.09 cm      PV Vmax:       0.87 m/s AV Area (Vmean):   2.07 cm      PV Vmean:      69.600 cm/s AV Area (VTI):     2.12 cm      PV VTI:        0.211 m AV Vmax:           171.00 cm/s   PV Peak grad:  3.0 mmHg AV Vmean:          109.000 cm/s  PV Mean grad:  2.0 mmHg AV VTI:            0.287 m AV Peak Grad:      11.7 mmHg AV Mean Grad:      6.0 mmHg LVOT Vmax:         126.00 cm/s LVOT Vmean:        79.400 cm/s LVOT VTI:          0.215 m LVOT/AV VTI ratio: 0.75  AORTA Ao Root diam: 2.60 cm Ao Asc diam:  2.60 cm MITRAL VALVE                TRICUSPID VALVE MV Area (PHT): 2.68 cm     TR Peak grad:   27.2 mmHg MV Decel Time: 283 msec     TR Mean grad:   20.0 mmHg MV E velocity: 94.30 cm/s   TR Vmax:        261.00 cm/s MV A velocity: 122.00 cm/s  TR Vmean:       214.0 cm/s MV E/A ratio:  0.77                              SHUNTS                             Systemic VTI:  0.22 m                             Systemic Diam: 1.90 cm Olga Millers MD Electronically signed by Olga Millers MD Signature Date/Time: 02/09/2022/12:32:57 PM    Final    [place] EKG SITE RITE  Result Date: 02/09/2022 If Site Rite image not attached, placement could not be confirmed due to current cardiac rhythm.    PHYSICAL EXAM Temp:  [98.4 F (36.9 C)-99.3 F (37.4 C)] 98.9 F (37.2 C) (02/24 0400) Pulse Rate:  [56-76] 69 (02/24 0800) Resp:  [11-26] 17 (02/24 0800) BP: (92-181)/(62-93) 148/73 (02/24 0800) SpO2:  [96 %-99 %] 99 % (02/24 0800) FiO2 (%):  [40 %-50 %] 40 % (02/24 0742) Weight:  [75 kg] 75 kg (02/24 0500)  General - Well nourished, well developed, woman intubated and lethargic  Cardiovascular - Regular rhythm and rate on telemetry. No longer bradycardic with stimulation.  Mental Status -  Patient drowsy under sedation but following some R sided commands; re-intubated Comprehension intact as patient can follow some commands.   Cranial Nerves II - XII - II - Pupils pinpoint and sluggish. Does not blink to threat in either eye. EOM to R intact on command, able to cross midline for L gaze today III, IV, VI - Dysconjugate gaze significantly improved. Oculocephalic reflex intact VIII - Hearing appears to be intact bilaterally, as patient follows commands.  X - Gag and cough reflex intact  Motor Strength - Patient withdraws to pain on R side in both extremities, less again today. No withdrawal to pain or meaningful movements in L sided extremities.   Plantar Reflexes: Negative Babinski bilaterally.  Gait and Station - deferred.    ASSESSMENT/PLAN Sarah Myers is a 52 y.o. female with history of HTN presenting with AMS, L sided weakness, and slurred speech.   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 Intra-axial blood volume slightly greater than 33 mL. Mass effect with 4-5 mm leftward midline shift; superimposed chronic meningocele progressing since 2016.  Repeat CTH at 0045 with 36 mL hemorrhagic area, and 0835 repeat unchanged. CTA head & neck No large vessel occlusion, intracranial aneurysm, or abnormal vasculature associated with hemorrhage. 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 CT repeat 2/20 stable hematoma and MLS Follow-up MRI 2/21 with some extension of edema and stable ICH. Follow up CT 2/24 with no progression of ICH 2D Echo LVEF 65-70%, small pericardial effusion present; trival MVR LDL 64 HgbA1c 5.6 VTE prophylaxis - heparin subcu No antithrombotic prior to admission, now on No antithrombotic.  Therapy recommendations:  Pending PT/OT/SLP eval when patient more stable Disposition:  Pending   Cerebral edema CT head showed midline shift 5 mm;  Repeat CT stable hematoma and midline shift MRI with some extension of edema and stable ICH, intraventricular extension noted. Follow up CT 2/24 with no progression of ICH On saline 75->50->NS->off 24 hr Na stable 155-157->155 Na monitoring  Hypertensive emergency SBP as high as 250 on presentation Home meds:  Coreg 25 mg BID, HCTZ 25 Now off Cleviprex Continue Norvasc 10 mg and Clonidine 0.1mg  TID Increased to Hydralazine 100 mg q6h  Due to inc Cr, d/c'd Losartan; Coreg dec to 3.125 mg BID BP goal less than 160 Long-term BP goal normotensive  Elevated Cr/ AKI, improving Cr inc from 0.85->1.58->1.29->1.37->1.14 D/c Losartan BMP monitoring Continue TF  Elevated Troponin Downtrending 387-269; EKG with sinus rhythm, PACs, and nonspecific ST wave abnormality, possible inferiolateral ischemia versus 2/2 BP derangements and intubation. Continue to monitor troponin level  Respiratory failure Intubated in the ED for airway  protection Extubated ->re-intubated 2/23 Now on versed and fentanyl Possible extubation over this weekend  Other Stroke Risk Factors Family hx stroke (Maternal Uncle)  Other Active Problems Leukocytosis WBC 14.1->12.1-> 13.9->12.4, UA negative; Tracheal aspirate culture with moderate WBC, gram pos cocci in pairs and clusters (S. Aureus and S. Marcescens) -> on cefepime Pressure ulcer of L hand  Hospital day # 5  Lamar Sprinkles, MD Stroke Neurology- Neuro Psych Resident 02/10/2022 8:05 AM  ATTENDING NOTE: I reviewed above note and agree with the assessment and plan. Pt was seen and examined.   Daughter and pastor are at bedside.  Patient  was extubated yesterday but then was reintubated due to stridor.  Per Dr. Denese Killings CCM, patient still candidate for possible extubation over the weekend.  Patient currently on Versed and fentanyl for sedation, not follow commands on my exam today, patient still able to open eyes with pain stimulation, pinpoint pupils, not blinking to visual threat.  Right UE and LE slight withdraw to pain summation, left hemiplegia.  BP stable, corrected down to 3.125 twice daily, continue hydralazine, Norvasc and clonidine.  Leukocytosis and creatinine continue to improve.  Sodium 155, off IV fluid, on tube feeding.  Repeat CT today shows stable ICH and midline shift.  Continue current management and supportive care.  I had long discussion with daughter at bedside, updated pt current condition, treatment plan and potential prognosis, and answered all the questions.  She expressed understanding and appreciation.  I also discussed with CCM Dr. Denese Killings   For detailed assessment and plan, please refer to above as I have made changes wherever appropriate.   Marvel Plan, MD PhD Stroke Neurology 02/10/2022 3:12 PM  This patient is critically ill due to right BG large ICH, cerebral edema, respiratory failure, AKI, hypertensive emergency and at significant risk of  neurological worsening, death form brain herniation, renal failure, seizure, sepsis. This patient's care requires constant monitoring of vital signs, hemodynamics, respiratory and cardiac monitoring, review of multiple databases, neurological assessment, discussion with family, other specialists and medical decision making of high complexity. I spent 35 minutes of neurocritical care time in the care of this patient.    To contact Stroke Continuity provider, please refer to [email or web address]. After hours, contact General Neurology

## 2022-02-10 NOTE — Progress Notes (Signed)
Inpatient Diabetes Program Recommendations  AACE/ADA: New Consensus Statement on Inpatient Glycemic Control (2015)  Target Ranges:  Prepandial:   less than 140 mg/dL      Peak postprandial:   less than 180 mg/dL (1-2 hours)      Critically ill patients:  140 - 180 mg/dL   Lab Results  Component Value Date   GLUCAP 204 (H) 02/10/2022   HGBA1C 5.6 02/06/2022    Review of Glycemic Control  Latest Reference Range & Units 02/09/22 19:51 02/09/22 23:39 02/10/22 03:32 02/10/22 08:24  Glucose-Capillary 70 - 99 mg/dL 622 (H) 297 (H) 989 (H) 204 (H)  (H): Data is abnormally high  Outpatient Diabetes medications: none Current orders for Inpatient glycemic control: Novolog 0-15 units Q4H Decadron 4 mg Q6H   Inpatient Diabetes Program Recommendations:    Consider adding Novolog 3 units Q4H for tube feed coverage (to be stopped or held in the event tube feeds are stopped).   Thanks, Lujean Rave, MSN, RNC-OB Diabetes Coordinator [phone] (8a-5p)

## 2022-02-10 NOTE — Progress Notes (Signed)
NAME:  Sarah Myers, MRN:  [id number], DOB:  September 03, 1971, LOS: 5 ADMISSION DATE:  02/05/2022, CONSULTATION DATE:  02/05/22 REFERRING MD:  Selina Cooley - neuro , CHIEF COMPLAINT:  Mechanically Ventilated  // ICH    History of Present Illness:  51 yo F PMH HTN presented to [facility] 2/19 with CC AMS. LKW 2100 2/18, at 0600 2/19 found to have L sided weakness, slurred speech. Evaluated as code stroke and found to have R side ICH with mass effect (brain compression) present on admission.   Intubated in ED for airway protection. Started on 3% saline, cleviprex. Admitted to Neuro team   Pertinent  Medical History  HTN  Significant Hospital Events: Including procedures, antibiotic start and stop dates in addition to other pertinent events   2/19 admitted to neuro with acute R ICH. Intubated for airway protection. Started on 3% and  cleviprex. PCCM consult. NSGY consult  02/06/2022 For repeat CT Head >> Overnight CT showed increase in size of bleed to 36 ml , but unchanged midline shift at 4 mm . Repeating CT head again 2/20 am. Neurosurgery is aware 2/21: 3% saline stopped due to Na 158. weaned off cleviprex. Remains intubated. 2/22: neuro exam much improved (moves RUE and RLE to command; thumbs up; blinks eyes to command; sticks tongue out); sinus pause overnight w/ inferiolateral ST elevation overnight; troponin 387 then 269 2/23 failed extubation due to stridor. Reintubated 2/24: Diuresis  Interim History / Subjective:  Extubated with post extubation stridor and required re-intubation.   Interactive on sedation, following commands on the right.   Objective   Blood pressure (!) 177/93, pulse 76, temperature 98.9 F (37.2 C), temperature source Oral, resp. rate 19, height 5\' 4"  (1.626 m), weight 75 kg, SpO2 98 %. CVP:  [16 mmHg-24 mmHg] 23 mmHg  Vent Mode: PRVC FiO2 (%):  [40 %-50 %] 40 % Set Rate:  [18 bmp] 18 bmp Vt Set:  [430 mL] 430 mL PEEP:  [5 cmH20] 5 cmH20 Plateau Pressure:  [0 cmH20-18  cmH20] 18 cmH20   Intake/Output Summary (Last 24 hours) at 02/10/2022 0750 Last data filed at 02/10/2022 0700 Gross per 24 hour  Intake 1756.48 ml  Output 1845 ml  Net -88.52 ml    Filed Weights   02/05/22 0913 02/10/22 0500  Weight: 74.8 kg 75 kg    Examination: General: Adult female, intubated on sedation with RASS -2.  HEENT: mm moist, ETT. /AT Neuro: Pupils 2 and sluggish, follows commands on the right upper and lower extremity, flaccid on the left.   CV: SR on telemetry. S1/s2.  Palpable pulses in extremities, warm and well perfused.   PULM:  Purulent secretions via ETT, thick beige with brown secretions.  Scattered rhonchi.  Symmetric chest expansion and synchronous with ventilator.  GI: soft, non tender, non distended.  Extremities: trace UE > LE edema  Skin: no rashes or lesions appreciated  Resolved Hospital Problem list     Assessment & Plan:   Acute respiratory insufficiency requiring intubation due to intracranial process, suspected pneumonia of right lower lobe -Extubated and reintubated 2/23 for post extubation stridor P: -Decadron, end date in place -Appears volume overloaded one exam, net + 8 L this admission.  Diuresis.  -Vanc and Cefepime initiated 2/23. MRSA nares negative. Discontinue Vanc. Follow up respiratory Cx. -Continue full vent support today. Minimal edema reported on intubation, consider SBT in am.  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 -- " Brain Compression" --  present on admission  Incidental finding:  proximal L PCA severe stenosis vs occlusion  P: -neurosurgery & neurology following; appreciate recs -SBP goal <160 per neuro  Hypertensive emergency Underlying uncontrolled HTN Episodes of bradycardia overnight 2/20: Elevated troponin -TTE 2/20: LVEF 65-70%, RV normal size and function, trivial mitral valve regurgitation P: -In regards to elevated troponin, no ST elevation on EKG. Patient would not  be a candidate for systemic a/c in setting of acute ICH.   Troponin in down trending. No acute interventions. Patient denies chest pain this am. -SBP goal <160 -cont coreg, hydralazine, clonidine, and norvasc  AKI -Baseline creatinine 0.7-0.8.  Suspect baseline BP was much higher, and renal function may reflect controlled BP Poor UOP -Renal US unremarkable P: -Creatinine trending down. Adequate UOP. Continue supportive care and avoid nephrotoxins.   Nonanion gap acidosis secondary to Hyperchloremia P: -Minimize IVF  Hyperglycemia -a1c 5.6 2/20 -likely steroid use related P: -Continue SSI  Anemia, normocytic -Suspect multifactorial in setting of daily lab draws and hemodilution. No active bleeding on exam.  P: -Goal Hgb > 7  Hypernatremia -Previously on hypertonic.  Would allow natural resolution of hypernatremia  Best Practice (right click and "Reselect all SmartList Selections" daily)   Diet/type: tubefeeds DVT prophylaxis: SCD, Heparin subq started 2/23 GI prophylaxis: PPI Lines: N/A Foley:  Yes, and it is still needed Code Status:  full code Last date of multidisciplinary goals of care discussion [per primary].  Daughter updated on plan of care.  Critical care time: 40 min     Earney Hamburg, ACNP [facility] 02/10/2022, 7:50 AM  Please use secure chat  From 7A-7P if no response, please call [phone]. After hours, please call ELink [phone].

## 2022-02-10 NOTE — Progress Notes (Signed)
PT Cancellation Note  Patient Details Name: Sarah Myers MRN: [id number] DOB: 1971/02/11   Cancelled Treatment:    Reason Eval/Treat Not Completed: Medical issues which prohibited therapy.  Pt remains intubated and sedated. PT will follow up when pt is alert and better able to participate in PT session.   Arlyss Gandy 02/10/2022, 2:30 PM

## 2022-02-10 NOTE — Procedures (Signed)
Cortrak ° °Person Inserting Tube:  Saxton Chain E, RD °Tube Type:  Cortrak - 43 inches °Tube Size:  10 °Tube Location:  Left nare °Initial Placement:  Stomach °Secured by: Bridle °Technique Used to Measure Tube Placement:  Marking at nare/corner of mouth °Cortrak Secured At:  65 cm ° °Cortrak Tube Team Note: ° °Consult received to place a Cortrak feeding tube.  ° °X-ray is required, abdominal x-ray has been ordered by the Cortrak team. Please confirm tube placement before using the Cortrak tube.  ° °If the tube becomes dislodged please keep the tube and contact the Cortrak team at [email or web address] (password [email or web address]) for replacement.  °If after hours and replacement cannot be delayed, place a NG tube and confirm placement with an abdominal x-ray.  ° ° ° °Eziah Negro A., MS, RD, LDN (she/her/hers) °RD pager number and weekend/on-call pager number located in Amion. ° ° °

## 2022-02-11 LAB — GLUCOSE, CAPILLARY
Glucose-Capillary: 153 mg/dL — ABNORMAL HIGH (ref 70–99)
Glucose-Capillary: 158 mg/dL — ABNORMAL HIGH (ref 70–99)
Glucose-Capillary: 161 mg/dL — ABNORMAL HIGH (ref 70–99)
Glucose-Capillary: 172 mg/dL — ABNORMAL HIGH (ref 70–99)
Glucose-Capillary: 186 mg/dL — ABNORMAL HIGH (ref 70–99)
Glucose-Capillary: 188 mg/dL — ABNORMAL HIGH (ref 70–99)
Glucose-Capillary: 205 mg/dL — ABNORMAL HIGH (ref 70–99)

## 2022-02-11 LAB — CULTURE, RESPIRATORY W GRAM STAIN

## 2022-02-11 LAB — SODIUM
Sodium: 153 mmol/L — ABNORMAL HIGH (ref 135–145)
Sodium: 152 mmol/L — ABNORMAL HIGH (ref 135–145)
Sodium: 152 mmol/L — ABNORMAL HIGH (ref 135–145)
Sodium: 147 mmol/L — ABNORMAL HIGH (ref 135–145)

## 2022-02-11 MED ORDER — SODIUM CHLORIDE 3 % IV SOLN
INTRAVENOUS | Status: DC
  Filled 2022-02-11: qty 500

## 2022-02-11 MED ORDER — HYDRALAZINE HCL 25 MG PO TABS: 25.0000 mg | ORAL_TABLET | Freq: Three times a day (TID) | ORAL | Status: DC

## 2022-02-11 MED ORDER — CLEVIDIPINE BUTYRATE 0.5 MG/ML IV EMUL
0.0000 mg/h | INTRAVENOUS | Status: DC
  Administered 2022-02-11: 2 mg/h via INTRAVENOUS
  Administered 2022-02-12: 1 mg/h via INTRAVENOUS
  Administered 2022-02-12: 10 mg/h via INTRAVENOUS
  Filled 2022-02-11 (×3): qty 50

## 2022-02-11 MED ORDER — CARVEDILOL 3.125 MG PO TABS
6.2500 mg | ORAL_TABLET | Freq: Two times a day (BID) | ORAL | Status: DC
  Administered 2022-02-12 – 2022-02-13 (×3): 6.25 mg
  Filled 2022-02-11 (×3): qty 2

## 2022-02-11 MED ORDER — FUROSEMIDE 10 MG/ML IJ SOLN
80.0000 mg | Freq: Once | INTRAMUSCULAR | Status: AC
Start: 2022-02-11 — End: 2022-02-11
  Administered 2022-02-11: 80 mg via INTRAVENOUS
  Filled 2022-02-11: qty 8

## 2022-02-11 MED ORDER — GLYCOPYRROLATE 0.2 MG/ML IJ SOLN
0.1000 mg | Freq: Once | INTRAMUSCULAR | Status: AC
  Administered 2022-02-12: 0.1 mg via INTRAVENOUS
  Filled 2022-02-11: qty 1

## 2022-02-11 NOTE — Progress Notes (Signed)
NAME:  Sarah Myers, MRN:  [id number], DOB:  17-Mar-1971, LOS: 6 ADMISSION DATE:  02/05/2022, CONSULTATION DATE:  02/05/22 REFERRING MD:  Quinn Axe - neuro , CHIEF COMPLAINT:  Mechanically Ventilated  // ICH    History of Present Illness:  51 yo F PMH HTN presented to [facility] 2/19 with CC AMS. LKW 2100 2/18, at 0600 2/19 found to have L sided weakness, slurred speech. Evaluated as code stroke and found to have R side ICH with mass effect (brain compression) present on admission.   Intubated in ED for airway protection. Started on 3% saline, cleviprex. Admitted to Neuro team   Pertinent  Medical History  HTN  Significant Hospital Events: Including procedures, antibiotic start and stop dates in addition to other pertinent events   2/19 admitted to neuro with acute R ICH. Intubated for airway protection. Started on 3% and  cleviprex. PCCM consult. NSGY consult  02/06/2022 For repeat CT Head >> Overnight CT showed increase in size of bleed to 36 ml , but unchanged midline shift at 4 mm . Repeating CT head again 2/20 am. Neurosurgery is aware 2/21: 3% saline stopped due to Na 158. weaned off cleviprex. Remains intubated. 2/22: neuro exam much improved (moves RUE and RLE to command; thumbs up; blinks eyes to command; sticks tongue out); sinus pause overnight w/ inferiolateral ST elevation overnight; troponin 387 then 269 2/23 failed extubation due to stridor. Reintubated 2/24: Diuresis  Interim History / Subjective:  Extubated with post extubation stridor and required re-intubation.   Interactive on sedation, following commands on the right.  Started on diuretics.   Objective   Blood pressure (!) 174/92, pulse 75, temperature 99.5 F (37.5 C), temperature source Oral, resp. rate 18, height 5\' 4"  (1.626 m), weight 75 kg, SpO2 97 %.    Vent Mode: PRVC FiO2 (%):  [40 %] 40 % Set Rate:  [18 bmp] 18 bmp Vt Set:  [430 mL] 430 mL PEEP:  [5 cmH20] 5 cmH20 Plateau Pressure:  [10 cmH20-22 cmH20] 15  cmH20   Intake/Output Summary (Last 24 hours) at 02/11/2022 0749 Last data filed at 02/11/2022 0600 Gross per 24 hour  Intake 1164.14 ml  Output 3975 ml  Net -2810.86 ml    Filed Weights   02/05/22 0913 02/10/22 0500 02/11/22 0500  Weight: 74.8 kg 75 kg 75 kg    Examination: General: Adult female, intubated on sedation with RASS -2.  HEENT: mm moist, ETT. [place]/AT Neuro: Pupils 2 and sluggish, follows commands on the right upper and lower extremity, flaccid on the left.   CV: SR on telemetry. S1/s2.  Palpable pulses in extremities, warm and well perfused.   PULM:  Purulent secretions via ETT, thick beige with brown secretions.  Scattered rhonchi.  Symmetric chest expansion and synchronous with ventilator.  GI: soft, non tender, non distended.  Extremities: trace UE > LE edema  Skin: no rashes or lesions appreciated  Ancillary tests personally reviewed:   Cr:1.25 Na: 153 S aureus and S marcescens in sputum Assessment & Plan:  Right temporal hematoma with brain compression Acute hypoxic respiratory failure due to acute cardiogenic pulmonary edema Hypertensive emergency AKI Hemodilution Upper airway stridor Iatrogenic hypernatremia  Plan:  - Suspect flash pulmonary edema may have caused extubation failure - Attempt SBT and extubation again today  - Robinul for secretions.  - Adequate course of steroids.  - 7 days of antibiotics.  - Add hydralazine to anti-HTN regimen.   Best Practice (right click and "Reselect all SmartList  Selections" daily)   Diet/type: tubefeeds DVT prophylaxis: SCD, Heparin subq started 2/23 GI prophylaxis: PPI Lines: N/A Foley:  Yes, and it is still needed Code Status:  full code Last date of multidisciplinary goals of care discussion [per primary].  Daughter updated on plan of care.  CRITICAL CARE Performed by: Kipp Brood   Total critical care time: 40 minutes  Critical care time was exclusive of separately billable procedures and  treating other patients.  Critical care was necessary to treat or prevent imminent or life-threatening deterioration.  Critical care was time spent personally by me on the following activities: development of treatment plan with patient and/or surrogate as well as nursing, discussions with consultants, evaluation of patient's response to treatment, examination of patient, obtaining history from patient or surrogate, ordering and performing treatments and interventions, ordering and review of laboratory studies, ordering and review of radiographic studies, pulse oximetry, re-evaluation of patient's condition and participation in multidisciplinary rounds.  Kipp Brood, MD [facility] ICU Physician [place]  Pager: [phone] Mobile: [phone] After hours: [phone].

## 2022-02-11 NOTE — Progress Notes (Addendum)
STROKE TEAM PROGRESS NOTE   SUBJECTIVE (INTERVAL HISTORY) Patient is seen with RN at the bedside.  Overall her condition is gradually improving. Attempted extubation 2/23 and patient had post-extubation stridor leading to re-intubation. Cords non-edematous.   Patient is lethargic on assessment 2/2 sedation but opens eyes to stimulation and is following some commands. Blood pressure elevated this AM, and patient's sodium has maintained around 152-155 range on NS.   Repeat CTH without progression of ICH and bilateral cribiform plate cephalocele.   OBJECTIVE CBC:  Recent Labs  Lab 02/06/22 1205 02/07/22 0320 02/08/22 0636 02/09/22 0634 02/10/22 0518  WBC 13.8* 14.1*   < > 13.9* 12.4*  NEUTROABS 12.1* 12.7*  --   --   --   HGB 11.5* 11.3*   < > 12.0 10.6*  HCT 35.5* 35.0*   < > 37.2 35.6*  MCV 87.0 89.5   < > 88.4 93.4  PLT 198 200   < > 155 136*   < > = values in this interval not displayed.    Basic Metabolic Panel:  Recent Labs  Lab 02/08/22 0636 02/08/22 1407 02/08/22 1909 02/08/22 2335 02/09/22 0634 02/09/22 0806 02/10/22 0518 02/10/22 0734 02/10/22 1931 02/11/22 0204  NA 155*   156*   < >  --    < > 155*   < > 155*   < > 153* 153*  K 4.1  --   --   --  4.3  --  4.2  --  4.0  --   CL >130*  --   --   --  129*  --  >130*  --  123*  --   CO2 16*  --   --   --  16*  --  19*  --  22  --   GLUCOSE 158*  --   --   --  218*  --  200*  --  195*  --   BUN 38*  --   --   --  37*  --  39*  --  43*  --   CREATININE 1.37*  --   --   --  1.29*  --  1.14*  --  1.25*  --   CALCIUM 9.2  --   --   --  9.3  --  8.7*  --  9.5  --   MG 2.1  --  2.4  --  2.5*  --   --   --   --   --   PHOS 3.2  --  3.1  --   --   --   --   --   --   --    < > = values in this interval not displayed.    Lipid Panel:  Recent Labs  Lab 02/06/22 0432 02/09/22 0634  CHOL 146  --   TRIG 180* 252*  HDL 46  --   CHOLHDL 3.2  --   VLDL 36  --   LDLCALC 64  --     HgbA1c:  Recent Labs  Lab  02/06/22 0432  HGBA1C 5.6    Urine Drug Screen:  Recent Labs  Lab 02/05/22 1338  LABOPIA NONE DETECTED  COCAINSCRNUR NONE DETECTED  LABBENZ NONE DETECTED  AMPHETMU NONE DETECTED  THCU NONE DETECTED  LABBARB NONE DETECTED     Alcohol Level No results for input(s): ETH in the last 168 hours.  IMAGING past 24 hours CT HEAD WO CONTRAST ( )  Result Date: 02/10/2022  CLINICAL DATA:  ICH follow-up EXAM: CT HEAD WITHOUT CONTRAST TECHNIQUE: Contiguous axial images were obtained from the base of the skull through the vertex without intravenous contrast. RADIATION DOSE REDUCTION: This exam was performed according to the departmental dose-optimization program which includes automated exposure control, adjustment of the mA and/or kV according to patient size and/or use of iterative reconstruction technique. COMPARISON:  02/06/2022 FINDINGS: Brain: Given differences in scan angle and head rotation unchanged size of parenchymal hemorrhage centered in the right temporal white matter and putamen with rim of edema. Trace intraventricular extension into the occipital horns is unchanged. Local mass effect without significant midline shift. No evidence of cortical infarct or new hemorrhage Vascular: No hyperdense vessel or unexpected calcification. Skull: Normal. Negative for fracture or focal lesion. Sinuses/Orbits: Progressive sinus opacification in the setting of intubation. Bilateral cribriform plate cephaloceles seen on coronal T2 weighted imaging. IMPRESSION: 1. No progression of the right cerebral ICH with small volume intraventricular extension. No new abnormality. 2. Bilateral cribriform plate cephalocele. 3. Progressive sinus inflammation in the setting of intubation. Electronically Signed   By: Tiburcio Pea M.D.   On: 02/10/2022 11:08   DG Abd Portable 1V  Result Date: 02/10/2022 CLINICAL DATA:  Cortrak feeding tube placement EXAM: PORTABLE ABDOMEN - 1 VIEW COMPARISON:  02/09/2022 FINDINGS:  Limited radiograph of the lower chest and upper abdomen was obtained for the purposes of enteric tube localization. Enteric tube is seen coursing below the diaphragm with distal tip terminating within the expected location of the distal gastric body. IMPRESSION: Enteric tube tip terminating within the expected location of the distal gastric body. Electronically Signed   By: Duanne Guess D.O.   On: 02/10/2022 12:01     PHYSICAL EXAM Temp:  [98.8 F (37.1 C)-99.7 F (37.6 C)] 99.5 F (37.5 C) (02/25 0358) Pulse Rate:  [57-78] 75 (02/25 0741) Resp:  [12-19] 18 (02/25 0605) BP: (111-174)/(66-92) 174/92 (02/25 0741) SpO2:  [95 %-100 %] 97 % (02/25 0605) FiO2 (%):  [40 %] 40 % (02/25 0334) Weight:  [75 kg] 75 kg (02/25 0500)  General - Well nourished, well developed, woman intubated and lethargic  Cardiovascular - Regular rhythm and rate on telemetry. No longer bradycardic with stimulation.  Mental Status -  Patient drowsy under sedation but following some R sided commands; remains intubated Comprehension intact as patient can follow some commands. Will squeeze hand to command.  Cranial Nerves II - XII - II - Pupils pinpoint and sluggish. Does not blink to threat in either eye. EOM to R intact on command III, IV, VI - Dysconjugate gaze significantly improved. Oculocephalic reflex intact VIII - Hearing appears to be intact bilaterally, as patient follows commands.  X - Gag and cough reflex intact; patient with thick tan secretions.  Motor Strength - Patient withdraws to pain on R side in both extremities, less again today. No withdrawal to pain or meaningful movements in L sided extremities.   Plantar Reflexes: Negative Babinski bilaterally.  Gait and Station - deferred.    ASSESSMENT/PLAN Ms. VANITA CANNELL is a 51 y.o. female with history of HTN presenting with AMS, L sided weakness, and slurred speech.   ICH: Acute right large BG ICH likely secondary to hypertensive  emergency Code Stroke CT head Positive for Acute Right Lentiform Hemorrhage tracking into the right temporal lobe. Intra-axial blood volume slightly greater than 33 mL. Mass effect with 4-5 mm leftward midline shift; superimposed chronic meningocele progressing since 2016.  Repeat CTH at 0045 with 36  mL hemorrhagic area, and 0835 repeat unchanged. CTA head & neck No large vessel occlusion, intracranial aneurysm, or abnormal vasculature associated with hemorrhage. 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 CT repeat 2/20 stable hematoma and MLS Follow-up MRI 2/21 with some extension of edema and stable ICH. Follow up CT 2/24 with no progression of ICH 2D Echo LVEF 65-70%, small pericardial effusion present; trival MVR LDL 64 HgbA1c 5.6 VTE prophylaxis - heparin subcu No antithrombotic prior to admission, now on No antithrombotic.  Therapy recommendations:  Pending PT/OT/SLP eval when patient more stable Disposition:  Pending   Cerebral edema CT head showed midline shift 5 mm;  Repeat CT stable hematoma and midline shift MRI with some extension of edema and stable ICH, intraventricular extension noted. Follow up CT 2/24 with no progression of ICH On saline 75->50->NS->off 24 hr Na stable 155-157->155 Na monitoring  Hypertensive emergency SBP as high as 250 on presentation Home meds:  Coreg 25 mg BID, HCTZ 25 Cleviprex restarted due to increased BP PRN Labetalol 10 mg q2h for SBP >150. Re-initiated as patient has been without bradycardia. Continue Norvasc 10 mg and Clonidine 0.1mg  TID Increased to Hydralazine 100 mg q6h  Due to inc Cr, d/c'd Losartan; Coreg dec to 3.125 mg BID BP goal less than 160 Long-term BP goal normotensive  Elevated Cr/ AKI, improving Cr inc from 0.85->1.58->1.29->1.37->1.14 D/c Losartan BMP monitoring Continue TF  Elevated Troponin Downtrending 387-269; EKG with sinus rhythm, PACs, and nonspecific ST wave  abnormality, possible inferiolateral ischemia versus 2/2 BP derangements and intubation.  Respiratory failure Intubated in the ED for airway protection Extubated ->re-intubated 2/23 Now on versed and fentanyl Extubation attempt today, per CCM  Other Stroke Risk Factors Family hx stroke (Maternal Uncle)  Other Active Problems Leukocytosis WBC 14.1->12.1-> 13.9->12.4, UA negative; Tracheal aspirate culture with moderate WBC, gram pos cocci in pairs and clusters (S. Aureus and S. Marcescens) -> on cefepime Pressure ulcer of L hand  Hospital day # 6  Lamar Sprinkles, MD Stroke Neurology- Neuro Psych Resident 02/11/2022 8:17 AM  ATTENDING ATTESTATION:  This is a patient with a large basal ganglia intracranial hemorrhage with mass effect.  She was extubated on Thursday and had to be urgently reintubated.  Dr. Michae Kava at [facility] is planning on extubation today since she has been diuresed.  On sedation not following commands.  Still on hypertonic saline sodium goal.  Withdrawal right upper and lower extremity pain no movement on left side.  Discussed case with CCM.  Blood pressure still elevated despite multiple hypertensive medications.  CCM as per back on labetalol as needed as there have been no further pauses on telemetry and added hydralazine today. Repeat CT tomorrow.  Dr. Viviann Spare evaluated pt independently, reviewed imaging, chart, labs. Discussed and formulated plan with the resident. Please see resident note above for details.      This patient is critically ill due to respiratory distress, ICH and at significant risk of neurological worsening, death form heart failure, respiratory failure, recurrent stroke, bleeding from [facility], seizure, sepsis. This patient's care requires constant monitoring of vital signs, hemodynamics, respiratory and cardiac monitoring, review of multiple databases, neurological assessment, discussion with family, other specialists and medical decision making of high  complexity. I spent 35 minutes of neurocritical care time in the care of this patient.   Taela Charbonneau,MD    To contact Stroke Continuity provider, please refer to [email or web address]. After hours, contact General Neurology

## 2022-02-11 NOTE — Progress Notes (Signed)
Pt hypertensive. PRN Labetalol given. Scheduled BP meds given. No relief. Dr. Denese Killings notified at 1005 and Cleviprex gtt started per order. Will monitor.

## 2022-02-11 NOTE — Progress Notes (Addendum)
eLink Physician-Brief Progress Note Patient Name: Sarah Myers DOB: [birth year] MRN: [id number]   Date of Service  02/11/2022  HPI/Events of Note  Sodium  eICU Interventions  Notified of NA 147, which is below target of 150-155.  Restarted 3% saline. Will monitor sodium trends and adjust drip as warranted.         Jerrold Haskell M DELA CRUZ 02/11/2022, 10:50 PM  6:46 Discontinued 3% saline as sodium had gone up to 160 Will continue to monitor Na

## 2022-02-11 NOTE — Progress Notes (Signed)
PT Cancellation Note  Patient Details Name: RASHAWN ROLON MRN: [id number] DOB: 07-30-1971   Cancelled Treatment:    Reason Eval/Treat Not Completed: Medical issues which prohibited therapy. RN reporting pt still sedated, agitated with attempts at weaning sedation earlier in the day. PT will follow up at a later time.   Arlyss Gandy 02/11/2022, 3:35 PM

## 2022-02-12 ENCOUNTER — Inpatient Hospital Stay (HOSPITAL_COMMUNITY): Payer: 59

## 2022-02-12 ENCOUNTER — Other Ambulatory Visit: Payer: Self-pay | Admitting: Family Medicine

## 2022-02-12 LAB — CBC
HCT: 35.4 % — ABNORMAL LOW (ref 36.0–46.0)
Hemoglobin: 11.3 g/dL — ABNORMAL LOW (ref 12.0–15.0)
MCH: 29 pg (ref 26.0–34.0)
MCHC: 31.9 g/dL (ref 30.0–36.0)
MCV: 90.8 fL (ref 80.0–100.0)
Platelets: 127 10*3/uL — ABNORMAL LOW (ref 150–400)
RBC: 3.9 MIL/uL (ref 3.87–5.11)
RDW: 15.1 % (ref 11.5–15.5)
WBC: 11.6 10*3/uL — ABNORMAL HIGH (ref 4.0–10.5)
nRBC: 0.4 % — ABNORMAL HIGH (ref 0.0–0.2)

## 2022-02-12 LAB — POCT I-STAT 7, (LYTES, BLD GAS, ICA,H+H)
Acid-Base Excess: 4 mmol/L — ABNORMAL HIGH (ref 0.0–2.0)
Bicarbonate: 29.8 mmol/L — ABNORMAL HIGH (ref 20.0–28.0)
Calcium, Ion: 1.51 mmol/L (ref 1.15–1.40)
HCT: 35 % — ABNORMAL LOW (ref 36.0–46.0)
Hemoglobin: 11.9 g/dL — ABNORMAL LOW (ref 12.0–15.0)
O2 Saturation: 100 %
Patient temperature: 99.8
Potassium: 4 mmol/L (ref 3.5–5.1)
Sodium: 155 mmol/L — ABNORMAL HIGH (ref 135–145)
TCO2: 31 mmol/L (ref 22–32)
pCO2 arterial: 53.3 mmHg — ABNORMAL HIGH (ref 32–48)
pH, Arterial: 7.359 (ref 7.35–7.45)
pO2, Arterial: 269 mmHg — ABNORMAL HIGH (ref 83–108)

## 2022-02-12 LAB — TRIGLYCERIDES: Triglycerides: 297 mg/dL — ABNORMAL HIGH (ref <150)

## 2022-02-12 LAB — SODIUM, URINE, RANDOM: Sodium, Ur: 91 mmol/L

## 2022-02-12 LAB — GLUCOSE, CAPILLARY
Glucose-Capillary: 147 mg/dL — ABNORMAL HIGH (ref 70–99)
Glucose-Capillary: 157 mg/dL — ABNORMAL HIGH (ref 70–99)
Glucose-Capillary: 168 mg/dL — ABNORMAL HIGH (ref 70–99)
Glucose-Capillary: 94 mg/dL (ref 70–99)
Glucose-Capillary: 129 mg/dL — ABNORMAL HIGH (ref 70–99)
Glucose-Capillary: 142 mg/dL — ABNORMAL HIGH (ref 70–99)

## 2022-02-12 LAB — BASIC METABOLIC PANEL
Anion gap: 6 (ref 5–15)
BUN: 45 mg/dL — ABNORMAL HIGH (ref 6–20)
CO2: 26 mmol/L (ref 22–32)
Calcium: 8.9 mg/dL (ref 8.9–10.3)
Chloride: 128 mmol/L — ABNORMAL HIGH (ref 98–111)
Creatinine, Ser: 1.05 mg/dL — ABNORMAL HIGH (ref 0.44–1.00)
GFR, Estimated: >60 mL/min (ref >60)
Glucose, Bld: 188 mg/dL — ABNORMAL HIGH (ref 70–99)
Potassium: 3.4 mmol/L — ABNORMAL LOW (ref 3.5–5.1)
Sodium: 160 mmol/L — ABNORMAL HIGH (ref 135–145)

## 2022-02-12 LAB — SODIUM
Sodium: 153 mmol/L — ABNORMAL HIGH (ref 135–145)
Sodium: 156 mmol/L — ABNORMAL HIGH (ref 135–145)

## 2022-02-12 LAB — OSMOLALITY, URINE: Osmolality, Ur: 674 mOsm/kg (ref 300–900)

## 2022-02-12 MED ORDER — ETOMIDATE 2 MG/ML IV SOLN
10.0000 mg | Freq: Once | INTRAVENOUS | Status: AC
  Administered 2022-02-12: 10 mg via INTRAVENOUS

## 2022-02-12 MED ORDER — POTASSIUM CHLORIDE 20 MEQ PO PACK
40.0000 meq | PACK | Freq: Once | ORAL | Status: AC
  Administered 2022-02-12: 40 meq via ORAL
  Filled 2022-02-12: qty 2

## 2022-02-12 MED ORDER — ROCURONIUM BROMIDE 10 MG/ML (PF) SYRINGE
PREFILLED_SYRINGE | INTRAVENOUS | Status: AC
  Filled 2022-02-12: qty 10

## 2022-02-12 MED ORDER — ROCURONIUM BROMIDE 10 MG/ML (PF) SYRINGE
60.0000 mg | PREFILLED_SYRINGE | Freq: Once | INTRAVENOUS | Status: AC
  Administered 2022-02-12: 60 mg via INTRAVENOUS

## 2022-02-12 MED ORDER — MIDAZOLAM HCL 2 MG/2ML IJ SOLN
2.0000 mg | Freq: Once | INTRAMUSCULAR | Status: AC
  Administered 2022-02-12: 2 mg via INTRAVENOUS

## 2022-02-12 MED ORDER — MIDAZOLAM HCL 2 MG/2ML IJ SOLN
INTRAMUSCULAR | Status: AC
  Filled 2022-02-12: qty 2

## 2022-02-12 MED ORDER — ETOMIDATE 2 MG/ML IV SOLN
INTRAVENOUS | Status: AC
  Filled 2022-02-12: qty 20

## 2022-02-12 NOTE — Progress Notes (Signed)
Pt extubated to 4L [place] with RN & Dr. Denese Killings at bedside. No cuff leak noted. Dr. Denese Killings aware. Pt unable to speak. Pt was reintubated due to respiratory distress.  RT will continue to monitor.

## 2022-02-12 NOTE — Progress Notes (Signed)
Pt placed on PS/CPAP 5/5 on 40% and is tolerating well. RT will continue to monitor. °

## 2022-02-12 NOTE — Procedures (Signed)
Intubation Procedure Note  Sarah Myers  [id number]  02/15/1971  Date:02/12/22  Time:2:13 PM   Provider Performing:Fateh Kindle    Procedure: Intubation ([id number])  Indication(s) Respiratory Failure  Consent Unable to obtain consent due to emergent nature of procedure.   Anesthesia Etomidate, Versed, Fentanyl, and Rocuronium   Time Out Verified patient identification, verified procedure, site/side was marked, verified correct patient position, special equipment/implants available, medications/allergies/relevant history reviewed, required imaging and test results available.   Sterile Technique Usual hand hygeine, masks, and gloves were used   Procedure Description Patient positioned in bed supine.  Sedation given as noted above.  Patient was intubated with endotracheal tube using Glidescope.  View was Grade 1 full glottis .  Number of attempts was 1.  Colorimetric CO2 detector was consistent with tracheal placement.   Complications/Tolerance None; patient tolerated the procedure well. Chest X-ray is ordered to verify placement.   EBL Minimal   Specimen(s) None  Kipp Brood, MD [facility] ICU Physician [place]  Pager: [phone] Or Epic Secure Chat After hours: [phone].  02/12/2022, 2:13 PM

## 2022-02-12 NOTE — Progress Notes (Signed)
STROKE TEAM PROGRESS NOTE   SUBJECTIVE (INTERVAL HISTORY)  Weaned for about 2 min. CT scan stable this morning. Daughter at the bedside. Replace K this morning.    Patient is seen with RN at the bedside.  Overall her condition is gradually improving. Attempted extubation 2/23 and patient had post-extubation stridor leading to re-intubation. Cords non-edematous.   Patient is lethargic on assessment 2/2 sedation but opens eyes to stimulation and is following some commands. Blood pressure elevated this AM, and patient's sodium has maintained around 152-155 range on NS.   Repeat CTH without progression of ICH and bilateral cribiform plate cephalocele.   OBJECTIVE CBC:  Recent Labs  Lab 02/06/22 1205 02/07/22 0320 02/08/22 0636 02/10/22 0518 02/12/22 0507  WBC 13.8* 14.1*   < > 12.4* 11.6*  NEUTROABS 12.1* 12.7*  --   --   --   HGB 11.5* 11.3*   < > 10.6* 11.3*  HCT 35.5* 35.0*   < > 35.6* 35.4*  MCV 87.0 89.5   < > 93.4 90.8  PLT 198 200   < > 136* 127*   < > = values in this interval not displayed.    Basic Metabolic Panel:  Recent Labs  Lab 02/08/22 0636 02/08/22 1407 02/08/22 1909 02/08/22 2335 02/09/22 0634 02/09/22 0806 02/10/22 1931 02/11/22 0204 02/12/22 0507 02/12/22 0812  NA 155*   156*   < >  --    < > 155*   < > 153*   < > 160* 156*  K 4.1  --   --   --  4.3   < > 4.0  --  3.4*  --   CL >130*  --   --   --  129*   < > 123*  --  128*  --   CO2 16*  --   --   --  16*   < > 22  --  26  --   GLUCOSE 158*  --   --   --  218*   < > 195*  --  188*  --   BUN 38*  --   --   --  37*   < > 43*  --  45*  --   CREATININE 1.37*  --   --   --  1.29*   < > 1.25*  --  1.05*  --   CALCIUM 9.2  --   --   --  9.3   < > 9.5  --  8.9  --   MG 2.1  --  2.4  --  2.5*  --   --   --   --   --   PHOS 3.2  --  3.1  --   --   --   --   --   --   --    < > = values in this interval not displayed.    Lipid Panel:  Recent Labs  Lab 02/06/22 0432 02/09/22 0634 02/12/22 0507   CHOL 146  --   --   TRIG 180*   < > 297*  HDL 46  --   --   CHOLHDL 3.2  --   --   VLDL 36  --   --   LDLCALC 64  --   --    < > = values in this interval not displayed.    HgbA1c:  Recent Labs  Lab 02/06/22 0432  HGBA1C 5.6    Urine Drug Screen:  Recent Labs  Lab 02/05/22 1338  LABOPIA NONE DETECTED  COCAINSCRNUR NONE DETECTED  LABBENZ NONE DETECTED  AMPHETMU NONE DETECTED  THCU NONE DETECTED  LABBARB NONE DETECTED     Alcohol Level No results for input(s): ETH in the last 168 hours.  IMAGING past 24 hours CT HEAD WO CONTRAST ( )  Result Date: 02/12/2022 CLINICAL DATA:  Stroke follow-up EXAM: CT HEAD WITHOUT CONTRAST TECHNIQUE: Contiguous axial images were obtained from the base of the skull through the vertex without intravenous contrast. RADIATION DOSE REDUCTION: This exam was performed according to the departmental dose-optimization program which includes automated exposure control, adjustment of the mA and/or kV according to patient size and/or use of iterative reconstruction technique. COMPARISON:  Two days ago FINDINGS: Brain: Right basal ganglia and temporal lobe hematoma has unchanged shape and extent with rim of edema causing local mass effect and 4 mm of midline shift. Hematoma measures up to 4.5 x 3.5 cm on coronal reformats, unchanged when remeasured on the same level at prior. No complicating infarct or hydrocephalus. No extra-axial collection. Vascular: Negative Skull: Bilateral cribriform plate cephalocele as noted previously. No acute finding Sinuses/Orbits: Sinusitis with nasal and nasopharyngeal fluid in the setting of intubation. IMPRESSION: 1. ICH without rebleeding. Hematoma and edema causes 4 mm of midline shift. 2. Bilateral cribriform plate cephalocele. Electronically Signed   By: Tiburcio Pea M.D.   On: 02/12/2022 06:34     PHYSICAL EXAM Temp:  [97.7 F (36.5 C)-99.8 F (37.7 C)] 99.8 F (37.7 C) (02/26 0800) Pulse Rate:  [50-80] 68 (02/26  0800) Resp:  [13-19] 18 (02/26 0800) BP: (96-223)/(51-117) 131/76 (02/26 0800) SpO2:  [95 %-100 %] 98 % (02/26 0800) FiO2 (%):  [40 %] 40 % (02/26 0815)  General - Well nourished, well developed, woman intubated and lethargic  Cardiovascular - Regular rhythm and rate on telemetry.   Mental Status -  Patient drowsy under sedation but following some R sided commands;  gave thumbs up today. remains intubated Comprehension intact as patient can follow some commands. Will squeeze hand to command.  Cranial Nerves II - XII - II - Pupils pinpoint and sluggish. Will look around but not follow finger or examiner. III, IV, VI - Dysconjugate gaze significantly improved. Oculocephalic reflex intact VIII - Hearing appears to be intact bilaterally, as patient follows commands.  X - Gag and cough reflex intact; patient with thick tan secretions.  Motor Strength - Patient withdraws to pain on R side in both extremities, less again today. No withdrawal to pain or meaningful movements in L sided extremities.   Plantar Reflexes: Negative Babinski bilaterally.  Gait and Station - deferred.    ASSESSMENT/PLAN Ms. AVARI NEVARES is a 51 y.o. female with history of HTN presenting with AMS, L sided weakness, and slurred speech.   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 Intra-axial blood volume slightly greater than 33 mL. Mass effect with 4-5 mm leftward midline shift; superimposed chronic meningocele progressing since 2016.  Repeat CTH at 0045 with 36 mL hemorrhagic area, and 0835 repeat unchanged. CTA head & neck No large vessel occlusion, intracranial aneurysm, or abnormal vasculature associated with hemorrhage. 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 CT repeat 2/20 stable hematoma and MLS Follow-up MRI 2/21 with some  extension of edema and stable ICH. Follow up CT 2/24 with no progression of ICH 2D  Echo LVEF 65-70%, small pericardial effusion present; trival MVR LDL 64 HgbA1c 5.6 VTE prophylaxis - heparin subcu No antithrombotic prior to admission, now on No antithrombotic.  Therapy recommendations:  Pending PT/OT/SLP eval when patient more stable Disposition:  Pending   Cerebral edema CT head showed midline shift 5 mm;  Repeat CT stable hematoma and midline shift MRI with some extension of edema and stable ICH, intraventricular extension noted. Follow up CT 2/24 with no progression of ICH F/U CT today 2/26 stable with 63mm shift. On saline 75->50->NS->off 24 hr Na stable 155-157->155>156 Na monitoring  Hypertensive emergency SBP as high as 250 on presentation Home meds:  Coreg 25 mg BID, HCTZ 25 Cleviprex  due to increased BP PRN Labetalol 10 mg q2h for SBP >150. Re-initiated as patient has been without bradycardia. Continue Norvasc 10 mg and Clonidine 0.1mg  TID Con't  Hydralazine 100 mg q6h  Due to inc Cr, d/c'd Losartan; Now on Coreg 6.25mg  BID BP goal less than 160 Long-term BP goal normotensive  Elevated Cr/ AKI, improving Cr inc from 0.85->1.58->1.29->1.37->1.14>1.05  Losartan stopped BMP monitoring Continue TF  Elevated Troponin Downtrending 387-269; EKG with sinus rhythm, PACs, and nonspecific ST wave abnormality, possible inferiolateral ischemia versus 2/2 BP derangements and intubation.  Respiratory failure Intubated in the ED for airway protection Extubated ->re-intubated 2/23 Fentanyl and versed ggt stopped this am around 10. Extubation per CCM   Other Stroke Risk Factors Family hx stroke (Maternal Uncle)  Other Active Problems Leukocytosis WBC 14.1->12.1-> 13.9->12.4, UA negative; Tracheal aspirate culture with moderate WBC, gram pos cocci in pairs and clusters (S. Aureus and S. Marcescens) -> on cefepime Pressure ulcer of L hand  Hospital day # 7   This is a  patient with a large basal ganglia intracranial hemorrhage with mass effect.  She was extubated on Thursday and had to be urgently reintubated.  on hypertonic saline sodium goal.  Repeat CT today is stable. Withdrawal right upper and lower extremity pain no movement on left side.   Blood pressure still elevated despite multiple hypertensive medications. Cr trending down. Consider restarting ARB tomorrow, will follow BP. Daughter at bedside, lengthy discussion on pt's diagnosis and prognosis.    This patient is critically ill due to respiratory distress, ICH and at significant risk of neurological worsening, death form heart failure, respiratory failure, recurrent stroke, bleeding from [facility], seizure, sepsis. This patient's care requires constant monitoring of vital signs, hemodynamics, respiratory and cardiac monitoring, review of multiple databases, neurological assessment, discussion with family, other specialists and medical decision making of high complexity. I spent 40 minutes of neurocritical care time in the care of this patient.   Larrie Fraizer,MD    To contact Stroke Continuity provider, please refer to [email or web address]. After hours, contact General Neurology

## 2022-02-12 NOTE — Progress Notes (Signed)
Date and time results received: 02/12/22 6:35 AM    Test: K+ and Na+  Critical Value:  K+ 3.4 Na+160  Name of Provider Notified: Warren Lacy  Orders Received? Or Actions Taken?:   Stop 3% saline; reevaluate at next Na+ lab draw  Royal Piedra, RN

## 2022-02-12 NOTE — Progress Notes (Signed)
PT Cancellation Note  Patient Details Name: Sarah Myers MRN: [id number] DOB: Nov 16, 1971   Cancelled Treatment:    Reason Eval/Treat Not Completed: Medical issues which prohibited therapy. Pt failed extubation again today. PT will sign off at this time, please re-consult once the pt is able to wean sedation and actively participate in PT evaluation.   Arlyss Gandy 02/12/2022, 4:33 PM

## 2022-02-12 NOTE — Progress Notes (Signed)
NAME:  Sarah Myers, MRN:  [id number], DOB:  08-Sep-1971, LOS: 7 ADMISSION DATE:  02/05/2022, CONSULTATION DATE:  02/05/22 REFERRING MD:  Selina Cooley - neuro , CHIEF COMPLAINT:  Mechanically Ventilated  // ICH    History of Present Illness:  51 yo F PMH HTN presented to [facility] 2/19 with CC AMS. LKW 2100 2/18, at 0600 2/19 found to have L sided weakness, slurred speech. Evaluated as code stroke and found to have R side ICH with mass effect (brain compression) present on admission.   Intubated in ED for airway protection. Started on 3% saline, cleviprex. Admitted to Neuro team   Pertinent  Medical History  HTN  Significant Hospital Events: Including procedures, antibiotic start and stop dates in addition to other pertinent events   2/19 admitted to neuro with acute R ICH. Intubated for airway protection. Started on 3% and  cleviprex. PCCM consult. NSGY consult  02/06/2022 For repeat CT Head >> Overnight CT showed increase in size of bleed to 36 ml , but unchanged midline shift at 4 mm . Repeating CT head again 2/20 am. Neurosurgery is aware 2/21: 3% saline stopped due to Na 158. weaned off cleviprex. Remains intubated. 2/22: neuro exam much improved (moves RUE and RLE to command; thumbs up; blinks eyes to command; sticks tongue out); sinus pause overnight w/ inferiolateral ST elevation overnight; troponin 387 then 269 2/23 failed extubation due to stridor. Reintubated 2/24: Diuresis 2/26: failed 2nd attempt at extubation.  Interim History / Subjective:  Tolerated second SBT today  Objective   Blood pressure 131/72, pulse 72, temperature 99.8 F (37.7 C), temperature source Axillary, resp. rate 18, height 5\' 4"  (1.626 m), weight 75 kg, SpO2 98 %.    Vent Mode: PRVC FiO2 (%):  [40 %] 40 % Set Rate:  [18 bmp] 18 bmp Vt Set:  [430 mL] 430 mL PEEP:  [5 cmH20] 5 cmH20 Pressure Support:  [5 cmH20] 5 cmH20 Plateau Pressure:  [16 cmH20-17 cmH20] 16 cmH20   Intake/Output Summary (Last 24 hours)  at 02/12/2022 1336 Last data filed at 02/12/2022 1300 Gross per 24 hour  Intake 1888.06 ml  Output 3525 ml  Net -1636.94 ml    Filed Weights   02/05/22 0913 02/10/22 0500 02/11/22 0500  Weight: 74.8 kg 75 kg 75 kg    Examination: General: Adult female, intubated on sedation with RASS -2.  HEENT: mm moist, ETT. [place]/AT Neuro: Pupils 2 and sluggish, follows commands on the right upper and lower extremity, flaccid on the left.   CV: SR on telemetry. S1/s2.  Palpable pulses in extremities, warm and well perfused.   PULM:  Purulent secretions via ETT, thick beige with brown secretions.  Scattered rhonchi.  Symmetric chest expansion and synchronous with ventilator.  GI: soft, non tender, non distended.  Extremities: trace UE > LE edema  Skin: no rashes or lesions appreciated  Ancillary tests personally reviewed:   Cr:1.05 Na: 156 S aureus and S marcescens in sputum Assessment & Plan:  Right temporal hematoma with brain compression Acute hypoxic respiratory failure due to acute cardiogenic pulmonary edema Hypertensive emergency AKI Hemodilution Upper airway stridor Iatrogenic hypernatremia  Plan:  -Patient failed a second attempt at extubation today.  Off sedation she clearly has left-sided weakness breathing is sonorous and consistent with a noncoapting vocal cord. -Continue current antihypertensives and sedative regimen -We will discuss tracheostomy and obtain consent from family at this is the only logical next step.  Given now awake she is she should be  a good rehabilitation candidate in the long run.  Best Practice (right click and "Reselect all SmartList Selections" daily)   Diet/type: tubefeeds DVT prophylaxis: SCD, Heparin subq started 2/23 GI prophylaxis: PPI Lines: N/A Foley:  Yes, and it is still needed Code Status:  full code Last date of multidisciplinary goals of care discussion [per primary].  Daughter updated on plan of care.  CRITICAL CARE Performed by: Lynnell Catalan  Total critical care time: 40 minutes  Critical care time was exclusive of separately billable procedures and treating other patients.  Critical care was necessary to treat or prevent imminent or life-threatening deterioration.  Critical care was time spent personally by me on the following activities: development of treatment plan with patient and/or surrogate as well as nursing, discussions with consultants, evaluation of patient's response to treatment, examination of patient, obtaining history from patient or surrogate, ordering and performing treatments and interventions, ordering and review of laboratory studies, ordering and review of radiographic studies, pulse oximetry, re-evaluation of patient's condition and participation in multidisciplinary rounds.  Lynnell Catalan, MD [facility] ICU Physician [facility] [place] [facility]  Pager: [phone] Mobile: [phone] After hours: [phone].

## 2022-02-13 LAB — BASIC METABOLIC PANEL
Anion gap: 4 — ABNORMAL LOW (ref 5–15)
BUN: 45 mg/dL — ABNORMAL HIGH (ref 6–20)
CO2: 25 mmol/L (ref 22–32)
Calcium: 9.2 mg/dL (ref 8.9–10.3)
Chloride: 124 mmol/L — ABNORMAL HIGH (ref 98–111)
Creatinine, Ser: 1.17 mg/dL — ABNORMAL HIGH (ref 0.44–1.00)
GFR, Estimated: 57 mL/min — ABNORMAL LOW (ref >60)
Glucose, Bld: 201 mg/dL — ABNORMAL HIGH (ref 70–99)
Potassium: 3.9 mmol/L (ref 3.5–5.1)
Sodium: 153 mmol/L — ABNORMAL HIGH (ref 135–145)

## 2022-02-13 LAB — GLUCOSE, CAPILLARY
Glucose-Capillary: 152 mg/dL — ABNORMAL HIGH (ref 70–99)
Glucose-Capillary: 154 mg/dL — ABNORMAL HIGH (ref 70–99)
Glucose-Capillary: 164 mg/dL — ABNORMAL HIGH (ref 70–99)
Glucose-Capillary: 168 mg/dL — ABNORMAL HIGH (ref 70–99)
Glucose-Capillary: 178 mg/dL — ABNORMAL HIGH (ref 70–99)
Glucose-Capillary: 201 mg/dL — ABNORMAL HIGH (ref 70–99)

## 2022-02-13 LAB — CBC
HCT: 35.7 % — ABNORMAL LOW (ref 36.0–46.0)
Hemoglobin: 11.3 g/dL — ABNORMAL LOW (ref 12.0–15.0)
MCH: 29 pg (ref 26.0–34.0)
MCHC: 31.7 g/dL (ref 30.0–36.0)
MCV: 91.8 fL (ref 80.0–100.0)
Platelets: 125 10*3/uL — ABNORMAL LOW (ref 150–400)
RBC: 3.89 MIL/uL (ref 3.87–5.11)
RDW: 15.1 % (ref 11.5–15.5)
WBC: 11.6 10*3/uL — ABNORMAL HIGH (ref 4.0–10.5)
nRBC: 0.4 % — ABNORMAL HIGH (ref 0.0–0.2)

## 2022-02-13 MED ORDER — CARVEDILOL 12.5 MG PO TABS
12.5000 mg | ORAL_TABLET | Freq: Two times a day (BID) | ORAL | Status: DC
  Filled 2022-02-13: qty 1

## 2022-02-13 MED ORDER — PANTOPRAZOLE 2 MG/ML SUSPENSION
40.0000 mg | Freq: Every day | ORAL | Status: DC
  Administered 2022-02-13 – 2022-03-21 (×37): 40 mg
  Filled 2022-02-13 (×38): qty 20

## 2022-02-13 MED ORDER — HYDRALAZINE HCL 50 MG PO TABS
100.0000 mg | ORAL_TABLET | Freq: Two times a day (BID) | ORAL | Status: DC
  Administered 2022-02-13 – 2022-02-14 (×2): 100 mg
  Filled 2022-02-13 (×2): qty 2

## 2022-02-13 NOTE — Progress Notes (Signed)
NAME:  Sarah Myers, MRN:  [id number], DOB:  02/09/1971, LOS: 8 ADMISSION DATE:  02/05/2022, CONSULTATION DATE:  2/19 REFERRING MD:  Selina Cooley - neuro, CHIEF COMPLAINT:  Left sided weakness, slurred speech   History of Present Illness:  51 y/o female came to the [facility] ER on 2/19 with left sided weakness and slurred speech.  Noted to have a right ICH with brain compression present on admission.    3% saline, cleviprex, intubated  Pertinent  Medical History  Hypertension  Significant Hospital Events: Including procedures, antibiotic start and stop dates in addition to other pertinent events   2/19 admitted to neuro with acute R ICH. Intubated for airway protection. Started on 3% and  cleviprex. PCCM consult. NSGY consult  02/06/2022 For repeat CT Head >> Overnight CT showed increase in size of bleed to 36 ml , but unchanged midline shift at 4 mm . Repeating CT head again 2/20 am. Neurosurgery is aware 2/21: 3% saline stopped due to Na 158. weaned off cleviprex. Remains intubated. 2/22: neuro exam much improved (moves RUE and RLE to command; thumbs up; blinks eyes to command; sticks tongue out); sinus pause overnight w/ inferiolateral ST elevation overnight; troponin 387 then 269 2/23 failed extubation due to stridor. Reintubated 2/24: Diuresis 2/26: failed 2nd attempt at extubation. CT head ICH without rebleeding. Hematoma and edema causes 4 mm of midline shift. 2. Bilateral cribriform plate cephalocele.  Micro 2/19 sars cov 2 / flu > negative 2/23 resp culture MSSA serratia  Antibiotics 2/23 cefepime >  2/23 vanc x 1  Interim History / Subjective:  No acute events   Objective   Blood pressure (!) 141/74, pulse 94, temperature (!) 100.5 F (38.1 C), temperature source Axillary, resp. rate 18, height 5\' 4"  (1.626 m), weight 75 kg, SpO2 98 %.    Vent Mode: PRVC FiO2 (%):  [40 %] 40 % Set Rate:  [18 bmp-20 bmp] 20 bmp Vt Set:  [430 mL] 430 mL PEEP:  [5 cmH20] 5 cmH20 Pressure  Support:  [5 cmH20] 5 cmH20 Plateau Pressure:  [16 cmH20-17 cmH20] 17 cmH20   Intake/Output Summary (Last 24 hours) at 02/13/2022 0844 Last data filed at 02/13/2022 0800 Gross per 24 hour  Intake 1541.95 ml  Output 1955 ml  Net -413.05 ml   Filed Weights   02/05/22 0913 02/10/22 0500 02/11/22 0500  Weight: 74.8 kg 75 kg 75 kg    Examination:  General:  In bed on vent HENT: NCAT ETT in place PULM: CTA B, vent supported breathing CV: RRR, no mgr GI: BS+, soft, nontender MSK: normal bulk and tone Neuro: awake, follows commands with her right hand    Resolved Hospital Problem list     Assessment & Plan:  Right temporal hematoma with brain compression > mentation improving Monitoring off hypertonic saline Monitor neuro status Minimize sedation PAD protocol Wean off fentanyl, versed infusion  Acute hypoxemic respiratory failure due to acute cardiogenic pulmonary edema Stridor due to upper airway edema, failed extubation x2 Needs tracheostomy> will call   MSSA/Serratia pneumonia Cefepime through 3/1  Hypertensive emergency Increase coreg to 12.5 mg bid Add losartan tomorrow Hydralazine > adjust to BID Continue clonidine Monitor off cleviprex  AKI > mild, improved compared to admission Monitor BMET and UOP Replace electrolytes as needed  Iatrogenic hypernatremia Monitor sodium, allow it to drift down  Best Practice (right click and "Reselect all SmartList Selections" daily)   Diet/type: tubefeeds DVT prophylaxis: systemic heparin GI prophylaxis: PPI Lines: Central line  and yes and it is still needed Foley:  N/A Code Status:  full code Last date of multidisciplinary goals of care discussion [2/27 updated Melburn Popper, daughter.  She consents for tracheostomy]  Labs   CBC: Recent Labs  Lab 02/06/22 1205 02/07/22 0320 02/08/22 0636 02/09/22 6222 02/10/22 0518 02/12/22 0507 02/12/22 1522 02/13/22 0451  WBC 13.8* 14.1* 12.1* 13.9* 12.4* 11.6*   --  11.6*  NEUTROABS 12.1* 12.7*  --   --   --   --   --   --   HGB 11.5* 11.3* 11.5* 12.0 10.6* 11.3* 11.9* 11.3*  HCT 35.5* 35.0* 36.0 37.2 35.6* 35.4* 35.0* 35.7*  MCV 87.0 89.5 89.6 88.4 93.4 90.8  --  91.8  PLT 198 200 179 155 136* 127*  --  125*    Basic Metabolic Panel: Recent Labs  Lab 02/07/22 0320 02/07/22 0813 02/07/22 1331 02/07/22 1831 02/07/22 2206 02/08/22 0636 02/08/22 1407 02/08/22 1909 02/08/22 2335 02/09/22 9798 02/09/22 0806 02/10/22 0518 02/10/22 0734 02/10/22 1931 02/11/22 0204 02/12/22 0138 02/12/22 0507 02/12/22 0812 02/12/22 1522 02/13/22 0451  NA 158*   < > 157*  --    < > 155*   156*   < >  --    < > 155*   < > 155*   < > 153*   < > 153* 160* 156* 155* 153*  K 4.0  --   --   --   --  4.1  --   --   --  4.3  --  4.2  --  4.0  --   --  3.4*  --  4.0 3.9  CL >130*  --   --   --   --  >130*  --   --   --  129*  --  >130*  --  123*  --   --  128*  --   --  124*  CO2 19*  --   --   --   --  16*  --   --   --  16*  --  19*  --  22  --   --  26  --   --  25  GLUCOSE 151*  --   --   --   --  158*  --   --   --  218*  --  200*  --  195*  --   --  188*  --   --  201*  BUN 29*  --   --   --   --  38*  --   --   --  37*  --  39*  --  43*  --   --  45*  --   --  45*  CREATININE 1.29*  --   --   --   --  1.37*  --   --   --  1.29*  --  1.14*  --  1.25*  --   --  1.05*  --   --  1.17*  CALCIUM 8.8*  --   --   --   --  9.2  --   --   --  9.3  --  8.7*  --  9.5  --   --  8.9  --   --  9.2  MG 2.2  --   --  2.0  --  2.1  --  2.4  --  2.5*  --   --   --   --   --   --   --   --   --   --  PHOS  --   --  2.3* 3.2  --  3.2  --  3.1  --   --   --   --   --   --   --   --   --   --   --   --    < > = values in this interval not displayed.   GFR: Estimated Creatinine Clearance: 57.7 mL/min (A) (by C-G formula based on SCr of 1.17 mg/dL (H)). Recent Labs  Lab 02/09/22 0634 02/10/22 0518 02/12/22 0507 02/13/22 0451  PROCALCITON 0.11  --   --   --   WBC 13.9* 12.4*  11.6* 11.6*    Liver Function Tests: Recent Labs  Lab 02/07/22 0320  AST 31  ALT 31  ALKPHOS 48  BILITOT 0.4  PROT 6.3*  ALBUMIN 3.1*   No results for input(s): LIPASE, AMYLASE in the last 168 hours. No results for input(s): AMMONIA in the last 168 hours.  ABG    Component Value Date/Time   PHART 7.359 02/12/2022 1522   PCO2ART 53.3 (H) 02/12/2022 1522   PO2ART 269 (H) 02/12/2022 1522   HCO3 29.8 (H) 02/12/2022 1522   TCO2 31 02/12/2022 1522   ACIDBASEDEF 6.1 (H) 02/07/2022 0945   O2SAT 100 02/12/2022 1522     Coagulation Profile: No results for input(s): INR, PROTIME in the last 168 hours.  Cardiac Enzymes: No results for input(s): CKTOTAL, CKMB, CKMBINDEX, TROPONINI in the last 168 hours.  HbA1C: Hemoglobin A1C  Date/Time Value Ref Range Status  09/26/2016 11:38 AM 5.3  Final   Hgb A1c MFr Bld  Date/Time Value Ref Range Status  02/06/2022 04:32 AM 5.6 4.8 - 5.6 % Final    Comment:    (NOTE) Pre diabetes:          5.7%-6.4%  Diabetes:              >6.4%  Glycemic control for   <7.0% adults with diabetes     CBG: Recent Labs  Lab 02/12/22 1528 02/12/22 1950 02/12/22 2321 02/13/22 0400 02/13/22 0757  GLUCAP 94 129* 142* 201* 152*    Critical care time: 40 minutes    Heber Wellston, MD [place] PCCM Pager: [phone] Cell: [phone] After 7:00 pm call Elink  [phone]

## 2022-02-13 NOTE — Progress Notes (Addendum)
STROKE TEAM PROGRESS NOTE   SUBJECTIVE (INTERVAL HISTORY)  Weaned for about 2 min. CT scan stable this morning. Daughter at the bedside. Replace K this morning.    Patient is seen with RN at the bedside.  Overall her condition is gradually improving. Attempted extubation 2/23 and patient had post-extubation stridor leading to re-intubation. Cords non-edematous.  Re-attempted extubation 2/26, and patient failed leading to reintubation.  Per CCM, will discuss placement of tracheostomy tube.  Patient is lethargic on assessment 2/2 sedation but opens eyes to stimulation and is following some commands. Blood pressure stable this AM, and patient's sodium has maintained around 153-156, with 1 time to 160 on NS.   Repeat CTH post reintubation without progression of ICH and bilateral cribiform plate cephalocele.   OBJECTIVE CBC:  Recent Labs  Lab 02/06/22 1205 02/07/22 0320 02/08/22 0636 02/12/22 0507 02/12/22 1522 02/13/22 0451  WBC 13.8* 14.1*   < > 11.6*  --  11.6*  NEUTROABS 12.1* 12.7*  --   --   --   --   HGB 11.5* 11.3*   < > 11.3* 11.9* 11.3*  HCT 35.5* 35.0*   < > 35.4* 35.0* 35.7*  MCV 87.0 89.5   < > 90.8  --  91.8  PLT 198 200   < > 127*  --  125*   < > = values in this interval not displayed.    Basic Metabolic Panel:  Recent Labs  Lab 02/08/22 0636 02/08/22 1407 02/08/22 1909 02/08/22 2335 02/09/22 0634 02/09/22 0806 02/12/22 0507 02/12/22 0812 02/12/22 1522 02/13/22 0451  NA 155*   156*   < >  --    < > 155*   < > 160*   < > 155* 153*  K 4.1  --   --   --  4.3   < > 3.4*  --  4.0 3.9  CL >130*  --   --   --  129*   < > 128*  --   --  124*  CO2 16*  --   --   --  16*   < > 26  --   --  25  GLUCOSE 158*  --   --   --  218*   < > 188*  --   --  201*  BUN 38*  --   --   --  37*   < > 45*  --   --  45*  CREATININE 1.37*  --   --   --  1.29*   < > 1.05*  --   --  1.17*  CALCIUM 9.2  --   --   --  9.3   < > 8.9  --   --  9.2  MG 2.1  --  2.4  --  2.5*  --   --    --   --   --   PHOS 3.2  --  3.1  --   --   --   --   --   --   --    < > = values in this interval not displayed.    Lipid Panel:  Recent Labs  Lab 02/12/22 0507  TRIG 297*    HgbA1c:  No results for input(s): HGBA1C in the last 168 hours.  Urine Drug Screen:  No results for input(s): LABOPIA, COCAINSCRNUR, LABBENZ, AMPHETMU, THCU, LABBARB in the last 168 hours.   Alcohol Level No results for input(s): ETH in the last  168 hours.  IMAGING past 24 hours DG CHEST PORT 1 VIEW  Result Date: 02/12/2022 CLINICAL DATA:  Intubation EXAM: PORTABLE CHEST 1 VIEW COMPARISON:  Radiograph 02/09/2022 FINDINGS: Endotracheal tube 1.8 cm from carina. NG tube extends the stomach. RIGHT PICC line with tip in distal SVC normal cardiac silhouette. RIGHT basilar atelectasis and small effusion unchanged. IMPRESSION: Support apparatus in good position. RIGHT basilar atelectasis and small effusion Electronically Signed   By: Suzy Bouchard M.D.   On: 02/12/2022 15:40     PHYSICAL EXAM Temp:  [99.8 F (37.7 C)-100.5 F (38.1 C)] 100.5 F (38.1 C) (02/27 0800) Pulse Rate:  [58-113] 73 (02/27 1100) Resp:  [12-33] 33 (02/27 1100) BP: (97-197)/(53-135) 130/75 (02/27 1100) SpO2:  [93 %-99 %] 97 % (02/27 1100) FiO2 (%):  [40 %] 40 % (02/27 1100)  General - Well nourished, well developed, woman intubated and lethargic  Cardiovascular - Regular rhythm and rate on telemetry.   Mental Status -  Patient drowsy under sedation but following some R sided commands;  gave thumbs up today. remains intubated Comprehension intact as patient can follow some commands. Will squeeze hand to command.  Cranial Nerves II - XII - II - Pupils pinpoint and sluggish. Will look around but not follow finger or examiner.  III, IV, VI - Dysconjugate gaze significantly improved. Oculocephalic reflex intact VIII - Hearing appears to be intact bilaterally, as patient follows commands.  X - Gag and cough reflex intact; patient  with thick tan secretions.  Motor Strength - Patient withdraws to pain on R side in both extremities, less again today. No withdrawal to pain or meaningful movements in L sided extremities.   Plantar Reflexes: Negative Babinski bilaterally.  Gait and Station - deferred.    ASSESSMENT/PLAN [name] is a 51 y.o. female with history of HTN presenting with AMS, L sided weakness, and slurred speech.   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 Intra-axial blood volume slightly greater than 33 mL. Mass effect with 4-5 mm leftward midline shift; superimposed chronic meningocele progressing since 2016.  Repeat CTH at 0045 with 36 mL hemorrhagic area, and 0835 repeat unchanged. CTA head & neck No large vessel occlusion, intracranial aneurysm, or abnormal vasculature associated with hemorrhage. 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 CT repeat 2/20 stable hematoma and MLS Follow-up MRI 2/21 with some extension of edema and stable ICH. Follow up CT 2/24 and 2/26 with no progression of ICH; 95mm shift on the latter 2D Echo LVEF 65-70%, small pericardial effusion present; trival MVR LDL 64 HgbA1c 5.6 VTE prophylaxis - heparin subcu No antithrombotic prior to admission, now on No antithrombotic.  Therapy recommendations:  Pending PT/OT/SLP eval when patient more stable Disposition:  Pending   Cerebral edema CT head showed midline shift 5 mm;  Repeat CT stable hematoma and midline shift MRI with some extension of edema and stable ICH, intraventricular extension noted. Follow up CT 2/24 with no progression of ICH F/U CT 2/26 stable with 2mm shift. On saline 75->50->NS->off 24 hr Na stable 155-157->155>156 Na monitoring  Hypertensive emergency SBP as high as 250 on presentation Home meds:  Coreg 25 mg  BID, HCTZ 25 Off of Cleviprex  PRN Labetalol 10 mg q2h for SBP >150. Re-initiated as patient has been without bradycardia. Continue Norvasc 10 mg and Clonidine 0.1mg  TID Hydralazine decreased to 100  mg q 12 h  Due to inc Cr, d/c'd Losartan; Now on Coreg increased to 12.5 mg BID BP goal less than 160 Long-term BP goal normotensive  Elevated Cr/ AKI, improving 24-hour Cr inc from 1.05-1.17  Losartan stopped BMP monitoring Continue TF  Elevated Troponin Downtrending 387-269; EKG with sinus rhythm, PACs, and nonspecific ST wave abnormality, possible inferiolateral ischemia versus 2/2 BP derangements and intubation.  Respiratory failure Intubated in the ED for airway protection Extubated ->re-intubated 2/23 and 2/26.  CCM to discuss with the family placement of tracheostomy tube Fentanyl and versed ggt both restarted  Other Stroke Risk Factors Family hx stroke (Maternal Uncle)  Other Active Problems Leukocytosis WBC now stable at 11.6, UA negative; Tracheal aspirate culture with moderate WBC, gram pos cocci in pairs and clusters (S. Aureus and S. Marcescens) -> on cefepime Pressure ulcer of L hand  Hospital day # 8    Rosezetta Schlatter, MD PGY-1 Neuro Psych resident 02/13/2022  12:00 PM   ATTENDING ATTESTATION:  Dr. Reeves Forth evaluated pt independently, reviewed imaging, chart, labs. Discussed and formulated plan with the resident. Please see  note above for details.     This is a patient with a large basal ganglia intracranial hemorrhage with mass effect.  She was extubated on Thursday and had to be urgently reintubated.  sodium  at goal, hypertonic on hold. Withdrawal right upper and lower extremity pain no movement on left side.   Blood pressure still elevated despite multiple hypertensive medications. CCM will discuss with family regarding trach now with 2nd unsuccessful extubation.     This patient is critically ill due to respiratory distress, ICH and at significant risk of  neurological worsening, death form heart failure, respiratory failure, recurrent stroke, bleeding from [facility], seizure, sepsis. This patient's care requires constant monitoring of vital signs, hemodynamics, respiratory and cardiac monitoring, review of multiple databases, neurological assessment, discussion with family, other specialists and medical decision making of high complexity. I spent 35 minutes of neurocritical care time in the care of this patient.      Tyri Elmore,MD   To contact Stroke Continuity provider, please refer to [email or web address] After hours, contact General Neurology

## 2022-02-14 ENCOUNTER — Inpatient Hospital Stay (HOSPITAL_COMMUNITY): Payer: 59

## 2022-02-14 LAB — BASIC METABOLIC PANEL
Anion gap: 7 (ref 5–15)
BUN: 38 mg/dL — ABNORMAL HIGH (ref 6–20)
CO2: 26 mmol/L (ref 22–32)
Calcium: 9.8 mg/dL (ref 8.9–10.3)
Chloride: 120 mmol/L — ABNORMAL HIGH (ref 98–111)
Creatinine, Ser: 0.96 mg/dL (ref 0.44–1.00)
GFR, Estimated: >60 mL/min (ref >60)
Glucose, Bld: 143 mg/dL — ABNORMAL HIGH (ref 70–99)
Potassium: 3.9 mmol/L (ref 3.5–5.1)
Sodium: 153 mmol/L — ABNORMAL HIGH (ref 135–145)

## 2022-02-14 LAB — CBC
HCT: 32.9 % — ABNORMAL LOW (ref 36.0–46.0)
Hemoglobin: 10.5 g/dL — ABNORMAL LOW (ref 12.0–15.0)
MCH: 29.1 pg (ref 26.0–34.0)
MCHC: 31.9 g/dL (ref 30.0–36.0)
MCV: 91.1 fL (ref 80.0–100.0)
Platelets: 115 10*3/uL — ABNORMAL LOW (ref 150–400)
RBC: 3.61 MIL/uL — ABNORMAL LOW (ref 3.87–5.11)
RDW: 14.7 % (ref 11.5–15.5)
WBC: 10.8 10*3/uL — ABNORMAL HIGH (ref 4.0–10.5)
nRBC: 0 % (ref 0.0–0.2)

## 2022-02-14 LAB — GLUCOSE, CAPILLARY
Glucose-Capillary: 116 mg/dL — ABNORMAL HIGH (ref 70–99)
Glucose-Capillary: 132 mg/dL — ABNORMAL HIGH (ref 70–99)
Glucose-Capillary: 136 mg/dL — ABNORMAL HIGH (ref 70–99)
Glucose-Capillary: 137 mg/dL — ABNORMAL HIGH (ref 70–99)
Glucose-Capillary: 171 mg/dL — ABNORMAL HIGH (ref 70–99)
Glucose-Capillary: 95 mg/dL (ref 70–99)

## 2022-02-14 LAB — APTT: aPTT: 34 seconds (ref 24–36)

## 2022-02-14 LAB — PROTIME-INR
INR: 1.1 (ref 0.8–1.2)
Prothrombin Time: 13.7 seconds (ref 11.4–15.2)

## 2022-02-14 MED ORDER — PROPOFOL 10 MG/ML IV BOLUS
500.0000 mg | Freq: Once | INTRAVENOUS | Status: DC
  Filled 2022-02-14: qty 60

## 2022-02-14 MED ORDER — SODIUM CHLORIDE 0.9 % IV SOLN
2.0000 g | Freq: Three times a day (TID) | INTRAVENOUS | Status: AC
  Administered 2022-02-14 – 2022-02-15 (×5): 2 g via INTRAVENOUS
  Filled 2022-02-14 (×5): qty 2

## 2022-02-14 MED ORDER — LOSARTAN POTASSIUM 50 MG PO TABS
50.0000 mg | ORAL_TABLET | Freq: Every day | ORAL | Status: DC
  Administered 2022-02-14 – 2022-02-15 (×2): 50 mg
  Filled 2022-02-14 (×3): qty 1

## 2022-02-14 MED ORDER — VECURONIUM BROMIDE 10 MG IV SOLR
10.0000 mg | Freq: Once | INTRAVENOUS | Status: AC
  Administered 2022-02-14: 10 mg via INTRAVENOUS
  Filled 2022-02-14: qty 10

## 2022-02-14 MED ORDER — MIDAZOLAM HCL 2 MG/2ML IJ SOLN
1.0000 mg | INTRAMUSCULAR | Status: DC | PRN
  Administered 2022-02-15 – 2022-02-17 (×8): 2 mg via INTRAVENOUS
  Administered 2022-02-18: 1 mg via INTRAVENOUS
  Filled 2022-02-14 (×9): qty 2

## 2022-02-14 MED ORDER — CARVEDILOL 12.5 MG PO TABS
12.5000 mg | ORAL_TABLET | Freq: Two times a day (BID) | ORAL | Status: DC
  Administered 2022-02-14 – 2022-02-16 (×5): 12.5 mg
  Filled 2022-02-14 (×5): qty 1

## 2022-02-14 MED ORDER — MIDAZOLAM HCL 2 MG/2ML IJ SOLN
5.0000 mg | Freq: Once | INTRAMUSCULAR | Status: AC
  Administered 2022-02-14: 5 mg via INTRAVENOUS

## 2022-02-14 MED ORDER — FENTANYL CITRATE PF 50 MCG/ML IJ SOSY
200.0000 ug | PREFILLED_SYRINGE | Freq: Once | INTRAMUSCULAR | Status: AC
  Administered 2022-02-14: 200 ug via INTRAVENOUS

## 2022-02-14 MED ORDER — ETOMIDATE 2 MG/ML IV SOLN
40.0000 mg | Freq: Once | INTRAVENOUS | Status: AC
  Administered 2022-02-14: 20 mg via INTRAVENOUS
  Filled 2022-02-14: qty 20

## 2022-02-14 NOTE — Procedures (Signed)
Percutaneous Tracheostomy Procedure Note   Sarah Myers  [id number]  [birth year]  Date:02/14/22  Time:1:41 PM   Provider Performing:Brent Jessalyn Hinojosa  Procedure: Percutaneous Tracheostomy with Bronchoscopic Guidance (09323)  Indication(s) Acute respiratory failure with hypoxemia, stroke, inability to protect airway  Consent Risks of the procedure as well as the alternatives and risks of each were explained to the patient and/or caregiver.  Consent for the procedure was obtained.  Anesthesia Etomidate, Versed, Fentanyl, Vecuronium   Time Out Verified patient identification, verified procedure, site/side was marked, verified correct patient position, special equipment/implants available, medications/allergies/relevant history reviewed, required imaging and test results available.   Sterile Technique Maximal sterile technique including sterile barrier drape, hand hygiene, sterile gown, sterile gloves, mask, hair covering.    Procedure Description Appropriate anatomy identified by palpation.  Patient's neck prepped and draped in sterile fashion.  1% lidocaine with epinephrine was used to anesthetize skin overlying neck.  1.5cm incision made and blunt dissection performed until tracheal rings could be easily palpated.   Then a size 6 Shiley tracheostomy was placed under bronchoscopic visualization using usual Seldinger technique and serial dilation.   Bronchoscope confirmed placement above the carina.  Tracheostomy was sutured in place with adhesive pad to protect skin under pressure.    Patient connected to ventilator.   Complications/Tolerance None; patient tolerated the procedure well. Chest X-ray is ordered to confirm no post-procedural complication.   EBL Minimal   Specimen(s) None   Heber [place], MD [place] [facility] Pager: [phone] Cell: [phone] After 7:00 pm call Elink  [phone]

## 2022-02-14 NOTE — Progress Notes (Signed)
NAME:  Sarah Myers, MRN:  [id number], DOB:  [birth year], LOS: 9 ADMISSION DATE:  02/05/2022, CONSULTATION DATE:  2/19 REFERRING MD:  Selina Cooley - neuro, CHIEF COMPLAINT:  Left sided weakness, slurred speech   History of Present Illness:  51 y/o female came to the [facility] ER on 2/19 with left sided weakness and slurred speech.  Noted to have a right ICH with brain compression present on admission.    3% saline, cleviprex, intubated  Pertinent  Medical History  Hypertension  Significant Hospital Events: Including procedures, antibiotic start and stop dates in addition to other pertinent events   2/19 admitted to neuro with acute R ICH. Intubated for airway protection. Started on 3% and  cleviprex. PCCM consult. NSGY consult  02/06/2022 For repeat CT Head >> Overnight CT showed increase in size of bleed to 36 ml , but unchanged midline shift at 4 mm . Repeating CT head again 2/20 am. Neurosurgery is aware 2/21: 3% saline stopped due to Na 158. weaned off cleviprex. Remains intubated. 2/22: neuro exam much improved (moves RUE and RLE to command; thumbs up; blinks eyes to command; sticks tongue out); sinus pause overnight w/ inferiolateral ST elevation overnight; troponin 387 then 269 2/23 failed extubation due to stridor. Reintubated 2/24: Diuresis 2/26: failed 2nd attempt at extubation. CT head ICH without rebleeding. Hematoma and edema causes 4 mm of midline shift. 2. Bilateral cribriform plate cephalocele.  Micro 2/19 sars cov 2 / flu > negative 2/23 resp culture MSSA serratia  Antibiotics 2/23 cefepime >  2/23 vanc x 1  Interim History / Subjective:   Fever yesterday No change other wise Didn't wean well on vent Remains mechanically ventilated and on continuous infusions of sedatives   Objective   Blood pressure 126/79, pulse (!) 58, temperature 99.3 F (37.4 C), temperature source Axillary, resp. rate 20, height 5\' 4"  (1.626 m), weight 76.7 kg, SpO2 97 %.    Vent Mode:  PRVC FiO2 (%):  [30 %-40 %] 30 % Set Rate:  [20 bmp] 20 bmp Vt Set:  [430 mL] 430 mL PEEP:  [5 cmH20] 5 cmH20 Plateau Pressure:  [12 cmH20-19 cmH20] 19 cmH20   Intake/Output Summary (Last 24 hours) at 02/14/2022 0845 Last data filed at 02/14/2022 0700 Gross per 24 hour  Intake 1438.26 ml  Output 800 ml  Net 638.26 ml   Filed Weights   02/10/22 0500 02/11/22 0500 02/14/22 0500  Weight: 75 kg 75 kg 76.7 kg    Examination:  General:  In bed on vent HENT: NCAT ETT in place PULM: CTA B, vent supported breathing CV: RRR, no mgr GI: BS+, soft, nontender MSK: normal bulk and tone Neuro: sedated on vent   Resolved Hospital Problem list     Assessment & Plan:  Right temporal hematoma with brain compression > mentation improving Monitor off hypertonic saline Stop PAD protocol after tracheostomy today Continue fentanyl/versed until tracheostomy Frequent orientation  Acute hypoxemic respiratory failure due to acute cardiogenic pulmonary edema Stridor due to upper airway edema, failed extubation x 2 Tracheostomy today Full mechanical vent support VAP prevention Daily WUA/SBT  MSSA/Serratia pneumonia, fever on 2/28 due to pneumonia Cefepime through 3/1  Hypertensive emergency> resolving Essential hypertension, difficult to control Coreg to 12.5mg  bid Add losartan Stop hydralazine Try to wean off clonidine  AKI > mild, improved compared to admission Monitor BMET and UOP Replace electrolytes as needed  Iatrogenic hypernatremia from hypertonic saline given for brain swelling Monitor off hypertonic saline Daily SBT Hold free  water  Best Practice (right click and "Reselect all SmartList Selections" daily)   Diet/type: tubefeeds DVT prophylaxis: systemic heparin GI prophylaxis: PPI Lines: Central line and yes and it is still needed Foley:  N/A Code Status:  full code Last date of multidisciplinary goals of care discussion [2/27 updated Melburn Popper, daughter.   She consents for tracheostomy]  Labs   CBC: Recent Labs  Lab 02/09/22 0634 02/10/22 0518 02/12/22 0507 02/12/22 1522 02/13/22 0451 02/14/22 0438  WBC 13.9* 12.4* 11.6*  --  11.6* 10.8*  HGB 12.0 10.6* 11.3* 11.9* 11.3* 10.5*  HCT 37.2 35.6* 35.4* 35.0* 35.7* 32.9*  MCV 88.4 93.4 90.8  --  91.8 91.1  PLT 155 136* 127*  --  125* 115*    Basic Metabolic Panel: Recent Labs  Lab 02/07/22 1331 02/07/22 1831 02/07/22 2206 02/08/22 0636 02/08/22 1407 02/08/22 1909 02/08/22 2335 02/09/22 1275 02/09/22 0806 02/10/22 0518 02/10/22 0734 02/10/22 1931 02/11/22 0204 02/12/22 0507 02/12/22 0812 02/12/22 1522 02/13/22 0451 02/14/22 0438  NA 157*  --    < > 155*   156*   < >  --    < > 155*   < > 155*   < > 153*   < > 160* 156* 155* 153* 153*  K  --   --    < > 4.1  --   --   --  4.3  --  4.2  --  4.0  --  3.4*  --  4.0 3.9 3.9  CL  --   --    < > >130*  --   --   --  129*  --  >130*  --  123*  --  128*  --   --  124* 120*  CO2  --   --    < > 16*  --   --   --  16*  --  19*  --  22  --  26  --   --  25 26  GLUCOSE  --   --    < > 158*  --   --   --  218*  --  200*  --  195*  --  188*  --   --  201* 143*  BUN  --   --    < > 38*  --   --   --  37*  --  39*  --  43*  --  45*  --   --  45* 38*  CREATININE  --   --    < > 1.37*  --   --   --  1.29*  --  1.14*  --  1.25*  --  1.05*  --   --  1.17* 0.96  CALCIUM  --   --    < > 9.2  --   --   --  9.3  --  8.7*  --  9.5  --  8.9  --   --  9.2 9.8  MG  --  2.0  --  2.1  --  2.4  --  2.5*  --   --   --   --   --   --   --   --   --   --   PHOS 2.3* 3.2  --  3.2  --  3.1  --   --   --   --   --   --   --   --   --   --   --   --    < > =  values in this interval not displayed.   GFR: Estimated Creatinine Clearance: 71.1 mL/min (by C-G formula based on SCr of 0.96 mg/dL). Recent Labs  Lab 02/09/22 0634 02/10/22 0518 02/12/22 0507 02/13/22 0451 02/14/22 0438  PROCALCITON 0.11  --   --   --   --   WBC 13.9* 12.4* 11.6* 11.6* 10.8*     Liver Function Tests: No results for input(s): AST, ALT, ALKPHOS, BILITOT, PROT, ALBUMIN in the last 168 hours.  No results for input(s): LIPASE, AMYLASE in the last 168 hours. No results for input(s): AMMONIA in the last 168 hours.  ABG    Component Value Date/Time   PHART 7.359 02/12/2022 1522   PCO2ART 53.3 (H) 02/12/2022 1522   PO2ART 269 (H) 02/12/2022 1522   HCO3 29.8 (H) 02/12/2022 1522   TCO2 31 02/12/2022 1522   ACIDBASEDEF 6.1 (H) 02/07/2022 0945   O2SAT 100 02/12/2022 1522     Coagulation Profile: No results for input(s): INR, PROTIME in the last 168 hours.  Cardiac Enzymes: No results for input(s): CKTOTAL, CKMB, CKMBINDEX, TROPONINI in the last 168 hours.  HbA1C: Hemoglobin A1C  Date/Time Value Ref Range Status  09/26/2016 11:38 AM 5.3  Final   Hgb A1c MFr Bld  Date/Time Value Ref Range Status  02/06/2022 04:32 AM 5.6 4.8 - 5.6 % Final    Comment:    (NOTE) Pre diabetes:          5.7%-6.4%  Diabetes:              >6.4%  Glycemic control for   <7.0% adults with diabetes     CBG: Recent Labs  Lab 02/13/22 1537 02/13/22 2011 02/13/22 2354 02/14/22 0408 02/14/22 0725  GLUCAP 164* 168* 178* 137* 116*    Critical care time: 32 minutes    Heber Seward, MD [place] [facility] Pager: [phone] Cell: [phone] After 7:00 pm call Elink  [phone]

## 2022-02-14 NOTE — Progress Notes (Signed)
Inpatient Diabetes Program Recommendations  AACE/ADA: New Consensus Statement on Inpatient Glycemic Control (2015)  Target Ranges:  Prepandial:   less than 140 mg/dL      Peak postprandial:   less than 180 mg/dL (1-2 hours)      Critically ill patients:  140 - 180 mg/dL   Lab Results  Component Value Date   GLUCAP 116 (H) 02/14/2022   HGBA1C 5.6 02/06/2022    Review of Glycemic Control  Latest Reference Range & Units 02/13/22 07:57 02/13/22 11:44 02/13/22 15:37 02/13/22 20:11 02/13/22 23:54 02/14/22 04:08 02/14/22 07:25  Glucose-Capillary 70 - 99 mg/dL 152 (H) 154 (H) 164 (H) 168 (H) 178 (H) 137 (H) 116 (H)  (H): Data is abnormally high   Current orders for Inpatient glycemic control: Novolog 0-15 units Q4H, Vital 1.5 @ 50 ml/hr  Received inpatient diabetes referral for follow up.   Agree with current regimen.  If CBG's consistently < 140 mg/dL may consider Novolog 0-9 units Q4H.    Will continue to follow while inpatient.  Thank you, Reche Dixon, MSN, RN Diabetes Coordinator Inpatient Diabetes Program [phone] (team pager from 8a-5p)

## 2022-02-14 NOTE — Progress Notes (Addendum)
STROKE TEAM PROGRESS NOTE   SUBJECTIVE (INTERVAL HISTORY) Patient is seen with RN at the bedside.  Overall her condition is gradually improving. Attempted extubation 2/23 and patient had post-extubation stridor leading to re-intubation. Cords non-edematous.  Re-attempted extubation 2/26, and patient failed leading to reintubation.  Per CCM, will place tracheostomy tube today.  Patient is lethargic on assessment 2/2 sedation but once sedation decreased, opens eyes to stimulation and is following some commands. Blood pressure stable this AM, and patient's sodium has maintained around 153.  Repeat CTH post reintubation without progression of ICH and bilateral cribiform plate cephalocele.   OBJECTIVE CBC:  Recent Labs  Lab 02/13/22 0451 02/14/22 0438  WBC 11.6* 10.8*  HGB 11.3* 10.5*  HCT 35.7* 32.9*  MCV 91.8 91.1  PLT 125* 115*    Basic Metabolic Panel:  Recent Labs  Lab 02/08/22 0636 02/08/22 1407 02/08/22 1909 02/08/22 2335 02/09/22 0634 02/09/22 0806 02/13/22 0451 02/14/22 0438  NA 155*   156*   < >  --    < > 155*   < > 153* 153*  K 4.1  --   --   --  4.3   < > 3.9 3.9  CL >130*  --   --   --  129*   < > 124* 120*  CO2 16*  --   --   --  16*   < > 25 26  GLUCOSE 158*  --   --   --  218*   < > 201* 143*  BUN 38*  --   --   --  37*   < > 45* 38*  CREATININE 1.37*  --   --   --  1.29*   < > 1.17* 0.96  CALCIUM 9.2  --   --   --  9.3   < > 9.2 9.8  MG 2.1  --  2.4  --  2.5*  --   --   --   PHOS 3.2  --  3.1  --   --   --   --   --    < > = values in this interval not displayed.    Lipid Panel:  Recent Labs  Lab 02/12/22 0507  TRIG 297*    HgbA1c:  No results for input(s): HGBA1C in the last 168 hours.  Urine Drug Screen:  No results for input(s): LABOPIA, COCAINSCRNUR, LABBENZ, AMPHETMU, THCU, LABBARB in the last 168 hours.   Alcohol Level No results for input(s): ETH in the last 168 hours.  IMAGING past 24 hours No results found.   PHYSICAL EXAM Temp:   [97.6 F (36.4 C)-101.9 F (38.8 C)] 97.6 F (36.4 C) (02/28 0400) Pulse Rate:  [55-100] 58 (02/28 0722) Resp:  [18-33] 20 (02/28 0722) BP: (111-226)/(45-110) 126/79 (02/28 0722) SpO2:  [96 %-100 %] 97 % (02/28 0722) FiO2 (%):  [30 %-40 %] 30 % (02/28 0722) Weight:  [76.7 kg] 76.7 kg (02/28 0500)  General - Well nourished, well developed, woman intubated and lethargic until sedation decreased, then alert.  Cardiovascular - Regular rhythm and rate on telemetry.   Mental Status -  Patient following R sided commands when sedation dec; gave thumbs up, squeezed hand, showed two fingers, and wiggled toes today. Remains intubated Comprehension intact as patient can follows commands.  Cranial Nerves II - XII - II - Pupils pinpoint and sluggish. Will look around but not follow finger or examiner.  III, IV, VI - Dysconjugate gaze significantly improved.  Oculocephalic reflex intact VIII - Hearing appears to be intact bilaterally, as patient follows commands.  X - Gag and cough reflex intact  Motor Strength - Patient withdraws to pain on R side in both extremities. No withdrawal to pain or meaningful movements in L sided extremities.   Plantar Reflexes: Negative Babinski bilaterally.  Gait and Station - deferred.    ASSESSMENT/PLAN [name] is a 51 y.o. female with history of HTN presenting with AMS, L sided weakness, and slurred speech.   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 Intra-axial blood volume slightly greater than 33 mL. Mass effect with 4-5 mm leftward midline shift; superimposed chronic meningocele progressing since 2016.  Repeat CTH at 0045 with 36 mL hemorrhagic area, and 0835 repeat unchanged. CTA head & neck No large vessel occlusion, intracranial aneurysm, or abnormal vasculature associated with hemorrhage. 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 CT repeat 2/20 stable hematoma and MLS Follow-up MRI 2/21 with some extension of edema and stable ICH. Follow up CT 2/24 and 2/26 with no progression of ICH; 69mm shift on the latter 2D Echo LVEF 65-70%, small pericardial effusion present; trival MVR LDL 64 HgbA1c 5.6 VTE prophylaxis - heparin subcu No antithrombotic prior to admission, now on No antithrombotic.  Therapy recommendations:  Pending PT/OT/SLP eval when patient more stable Disposition:  Pending   Cerebral edema CT head showed midline shift 5 mm;  Repeat CT stable hematoma and midline shift MRI with some extension of edema and stable ICH, intraventricular extension noted. Follow up CT 2/24 with no progression of ICH F/U CT 2/26 stable with 63mm shift. Saline 75->50->NS->off 24 hr Na stable 153 Na monitoring on daily BMP  Hypertensive emergency SBP as high as 250 on presentation Home meds:  Coreg 25 mg BID, HCTZ 25 Off of Cleviprex  PRN Labetalol 10 mg q2h for SBP >150. Re-initiated as patient has been without bradycardia. Continue Norvasc 10 mg and Clonidine 0.1mg  TID, and Coreg 12.5 mg BID Hydralazine d/c Due to inc Cr, initially d/c'd Losartan, but Cr stable at 0.96 so Losartan 50 mg restarted today  BP goal less than 160 Long-term BP goal normotensive  Elevated Cr/ AKI, resolved 24-hour Cr dec from 1.17-0.96 Losartan restarted BMP monitoring Continue TF  Elevated Troponin Downtrending 387-269; EKG with sinus rhythm, PACs, and nonspecific ST wave abnormality, possible inferiolateral ischemia versus 2/2 BP derangements and intubation.  Respiratory failure Intubated in the ED for airway protection Extubated ->re-intubated 2/23 and 2/26.  CCM facilitating placement of tracheostomy tube today Fentanyl and versed ggt both continued  Other Stroke Risk Factors Family hx stroke (Maternal Uncle)  Other Active Problems Leukocytosis: WBC  downtrending 11.6-> 10.8, UA negative; Tracheal aspirate culture with moderate WBC, gram pos cocci in pairs and clusters (S. Aureus and S. Marcescens) -> on cefepime (renally dosed by pharmacy) Pressure ulcer of L hand  Hospital day # 9    Sarah Sprinkles, MD PGY-1 Neuro Psych resident 02/14/2022  8:22 AM   ATTENDING ATTESTATION:  This is a patient with a large basal ganglia intracranial hemorrhage with mass effect.  On exam Withdrawal right upper and lower extremity pain no movement on left side.   Blood pressure still elevated despite multiple hypertensive medications. Losartan added today as Cr has improved. CCM plans to trach.    Dr. Viviann Spare evaluated pt independently, reviewed imaging,  chart, labs. Discussed and formulated plan with the resident. Please see  note above for details.      This patient is critically ill due to respiratory distress, ICH and at significant risk of neurological worsening, death form heart failure, respiratory failure, recurrent stroke, bleeding from [facility], seizure, sepsis. This patient's care requires constant monitoring of vital signs, hemodynamics, respiratory and cardiac monitoring, review of multiple databases, neurological assessment, discussion with family, other specialists and medical decision making of high complexity. I spent 35 minutes of neurocritical care time in the care of this patient.     Aris Moman,MD   To contact Stroke Continuity provider, please refer to [email or web address]. After hours, contact General Neurology

## 2022-02-14 NOTE — Progress Notes (Signed)
PHARMACY NOTE:  ANTIMICROBIAL RENAL DOSAGE ADJUSTMENT  Current antimicrobial regimen includes a mismatch between antimicrobial dosage and estimated renal function.  As per policy approved by the Pharmacy & Therapeutics and Medical Executive Committees, the antimicrobial dosage will be adjusted accordingly.  Current antimicrobial dosage:  Cefepime 2g IV q12h  Indication: serratia/MSSA PNA  Renal Function:  Estimated Creatinine Clearance: 71.1 mL/min (by C-G formula based on SCr of 0.96 mg/dL).    Antimicrobial dosage has been changed to:  cefepime 2g IV q8h  Additional comments: Stop date 3/1 per CCM   Sarah Myers, PharmD, BCPS Please check AMION for all [facility] contact numbers Clinical Pharmacist 02/14/2022 8:45 AM

## 2022-02-14 NOTE — Procedures (Signed)
Diagnostic Bronchoscopy  KINDRED HEYING  [id number]  November 29, 1971  Date:02/14/22  Time:2:19 PM   Provider Performing:Hebert Dooling   Procedure: Diagnostic Bronchoscopy (37169)  Indication(s) Assist with direct visualization of tracheostomy placement  Consent Risks of the procedure as well as the alternatives and risks of each were explained to the patient and/or caregiver.  Consent for the procedure was obtained.   Anesthesia See separate tracheostomy note   Time Out Verified patient identification, verified procedure, site/side was marked, verified correct patient position, special equipment/implants available, medications/allergies/relevant history reviewed, required imaging and test results available.   Sterile Technique Usual hand hygiene, masks, gowns, and gloves were used   Procedure Description Bronchoscope advanced through endotracheal tube and into airway.  After suctioning out tracheal secretions, bronchoscope used to provide direct visualization of tracheostomy placement.   Complications/Tolerance None; patient tolerated the procedure well.   EBL None  Specimen(s) None

## 2022-02-15 LAB — CBC
HCT: 32.4 % — ABNORMAL LOW (ref 36.0–46.0)
Hemoglobin: 10 g/dL — ABNORMAL LOW (ref 12.0–15.0)
MCH: 28.5 pg (ref 26.0–34.0)
MCHC: 30.9 g/dL (ref 30.0–36.0)
MCV: 92.3 fL (ref 80.0–100.0)
Platelets: 124 10*3/uL — ABNORMAL LOW (ref 150–400)
RBC: 3.51 MIL/uL — ABNORMAL LOW (ref 3.87–5.11)
RDW: 14.6 % (ref 11.5–15.5)
WBC: 9.8 10*3/uL (ref 4.0–10.5)
nRBC: 0 % (ref 0.0–0.2)

## 2022-02-15 LAB — BASIC METABOLIC PANEL
Anion gap: 6 (ref 5–15)
BUN: 43 mg/dL — ABNORMAL HIGH (ref 6–20)
CO2: 25 mmol/L (ref 22–32)
Calcium: 9.4 mg/dL (ref 8.9–10.3)
Chloride: 118 mmol/L — ABNORMAL HIGH (ref 98–111)
Creatinine, Ser: 1.15 mg/dL — ABNORMAL HIGH (ref 0.44–1.00)
GFR, Estimated: 58 mL/min — ABNORMAL LOW (ref >60)
Glucose, Bld: 184 mg/dL — ABNORMAL HIGH (ref 70–99)
Potassium: 4 mmol/L (ref 3.5–5.1)
Sodium: 149 mmol/L — ABNORMAL HIGH (ref 135–145)

## 2022-02-15 LAB — GLUCOSE, CAPILLARY
Glucose-Capillary: 149 mg/dL — ABNORMAL HIGH (ref 70–99)
Glucose-Capillary: 132 mg/dL — ABNORMAL HIGH (ref 70–99)
Glucose-Capillary: 168 mg/dL — ABNORMAL HIGH (ref 70–99)
Glucose-Capillary: 102 mg/dL — ABNORMAL HIGH (ref 70–99)
Glucose-Capillary: 159 mg/dL — ABNORMAL HIGH (ref 70–99)

## 2022-02-15 MED ORDER — OSMOLITE 1.5 CAL PO LIQD
1000.0000 mL | ORAL | Status: AC
  Administered 2022-02-15 – 2022-02-20 (×4): 1000 mL

## 2022-02-15 MED ORDER — CLONIDINE HCL 0.1 MG PO TABS
0.1000 mg | ORAL_TABLET | Freq: Two times a day (BID) | ORAL | Status: AC
Start: 2022-02-15 — End: 2022-02-16
  Administered 2022-02-15 – 2022-02-16 (×3): 0.1 mg via NASOGASTRIC
  Filled 2022-02-15 (×3): qty 1

## 2022-02-15 MED ORDER — ACETAMINOPHEN 325 MG PO TABS: 650.0000 mg | ORAL_TABLET | Freq: Four times a day (QID) | ORAL | Status: DC | PRN

## 2022-02-15 MED ORDER — QUETIAPINE FUMARATE 50 MG PO TABS
50.0000 mg | ORAL_TABLET | Freq: Every day | ORAL | Status: DC
  Administered 2022-02-15 – 2022-03-04 (×18): 50 mg
  Filled 2022-02-15: qty 2
  Filled 2022-02-15 (×4): qty 1
  Filled 2022-02-15 (×2): qty 2
  Filled 2022-02-15 (×2): qty 1
  Filled 2022-02-15: qty 2
  Filled 2022-02-15 (×2): qty 1
  Filled 2022-02-15: qty 2
  Filled 2022-02-15 (×3): qty 1
  Filled 2022-02-15: qty 2
  Filled 2022-02-15: qty 1

## 2022-02-15 MED ORDER — QUETIAPINE FUMARATE 25 MG PO TABS: 50.0000 mg | ORAL_TABLET | Freq: Every day | ORAL | Status: DC

## 2022-02-15 NOTE — Progress Notes (Addendum)
STROKE TEAM PROGRESS NOTE  ? ?SUBJECTIVE (INTERVAL HISTORY) ?Patient is seen with RN at the bedside. Tracheostomy placed 2/28. Add seroquel and start PT/OT ? ?OBJECTIVE ?CBC:  ?Recent Labs  ?Lab 02/14/22 ?0438 02/15/22 ?6837  ?WBC 10.8* 9.8  ?HGB 10.5* 10.0*  ?HCT 32.9* 32.4*  ?MCV 91.1 92.3  ?PLT 115* 124*  ? ? ?Basic Metabolic Panel:  ?Recent Labs  ?Lab 02/08/22 ?1909 02/08/22 ?2335 02/09/22 ?2902 02/09/22 ?1115 02/14/22 ?5208 02/15/22 ?0223  ?NA  --    < > 155*   < > 153* 149*  ?K  --   --  4.3   < > 3.9 4.0  ?CL  --   --  129*   < > 120* 118*  ?CO2  --   --  16*   < > 26 25  ?GLUCOSE  --   --  218*   < > 143* 184*  ?BUN  --   --  37*   < > 38* 43*  ?CREATININE  --   --  1.29*   < > 0.96 1.15*  ?CALCIUM  --   --  9.3   < > 9.8 9.4  ?MG 2.4  --  2.5*  --   --   --   ?PHOS 3.1  --   --   --   --   --   ? < > = values in this interval not displayed.  ? ? ?Lipid Panel:  ?Recent Labs  ?Lab 02/12/22 ?0507  ?TRIG 297*  ? ? ?HgbA1c:  ?No results for input(s): HGBA1C in the last 168 hours. ? ?Urine Drug Screen:  ?No results for input(s): LABOPIA, COCAINSCRNUR, LABBENZ, AMPHETMU, THCU, LABBARB in the last 168 hours. ?  ?Alcohol Level No results for input(s): ETH in the last 168 hours. ? ?IMAGING past 24 hours ?DG Chest Port 1 View ? ?Result Date: 02/14/2022 ?CLINICAL DATA:  Post tracheostomy EXAM: PORTABLE CHEST 1 VIEW COMPARISON:  02/12/2022 FINDINGS: Interval placement of tracheostomy in good position. No pneumothorax or subcutaneous gas Right-sided central line tip in the SVC. Feeding tube enters the stomach with the tip not visualized Progressive left lower lobe consolidation which may be collapse or pneumonia. Mild airspace disease right lung base unchanged. No significant pleural effusion IMPRESSION: Satisfactory tracheostomy placement.  No pneumothorax Progressive left lower lobe consolidation. Electronically Signed   By: Marlan Palau M.D.   On: 02/14/2022 14:07   ? ? ?PHYSICAL EXAM ?Temp:  [98.2 ?F (36.8 ?C)-100  ?F (37.8 ?C)] 100 ?F (37.8 ?C) (03/01 0800) ?Pulse Rate:  [53-94] 81 (03/01 0817) ?Resp:  [15-26] 21 (03/01 0800) ?BP: (103-194)/(51-99) 103/51 (03/01 0909) ?SpO2:  [93 %-100 %] 96 % (03/01 0800) ?FiO2 (%):  [30 %] 30 % (03/01 0726) ? ?General - Well nourished, well developed, woman intubated and lethargic until sedation decreased, then alert. ? ?Cardiovascular - Regular rhythm and rate on telemetry.  ? ?Mental Status -  ?Patient following R sided commands , gave thumbs up, squeezed hand, showed two fingers, and wiggled toes today. ?Comprehension intact as patient can follows commands. ? ?Cranial Nerves II - XII - ?II - Pupils pinpoint and sluggish. Will look around but not follow finger or examiner.  ?III, IV, VI - Dysconjugate gaze significantly improved. Oculocephalic reflex intact ?VIII - Hearing appears to be intact bilaterally, as patient follows commands.  ?X - Gag and cough reflex intact ? ?Motor Strength - Patient withdraws to pain on R side in both extremities. No withdrawal to  [place] [phone] [place]700.98([email or web address] KitchenMarlan Palau Maureen Chatters 54m aurier Nancy31 at0.98(Jo60m.Lucy [phone] Nan<BADTEXTTAGGeorgi92mna [id number] DTEXTTAG>44m<BADTGeorgi62mna [id number] TTAG>ADTEXTTAG> 387 arMarland Kitchenlan PalaGeorgi22mna [id number] > Uncle) ? ?Other Active Problems ?Leukocytosis: WBC downtrending48m 11. sters (S. Aureus and S. Marcescens) -> on cefepime (renally dosed by pharmacy) ?Pressure ulcer of L hand ? ?Hospital day # 10 ? ? Patient seen and examined by NP/APP MarMarland Kitchenlan Palau to upd0.98([id number])3Lucy [phone] Nan N: ?  ?This is a patient wiMaureen C ower extremity pain no movement on left side. S/p trach. Getting19m agi chart, labs. Discussed and formulated plan with the resident. Please see  note above for details.    ?  ?  This patient is critically ill due to respiratory distress, ICH and at significant risk of neurological worsening, death form heart failure, respiratory failure, recurrent stroke, bleeding from [facility], seizure, sepsis. This patient's care requires constant monitoring of vital signs, hemodynamics, respiratory and cardiac monitoring, review of multiple databases, neurological assessment, discussion with family, other specialists and medical decision making of high complexity. I spent 35 minutes of neurocritical care time in the care of this patient.  ?  ?  ?Chia Rock,MD  ? ?

## 2022-02-15 NOTE — Progress Notes (Signed)
   Inpatient Rehab Admissions Coordinator :  Per therapy recommendations patient was screened for CIR candidacy by Antavious Spanos RN MSN. Patient is not yet at a level to tolerate the intensity required to pursue a CIR admit . Patient may have the potential to progress to become a candidate. The CIR admissions team will follow and monitor for progress and place a Rehab Consult order if felt to be appropriate. Please contact me with any questions.  Zala Degrasse RN MSN Admissions Coordinator [phone]  

## 2022-02-15 NOTE — Progress Notes (Signed)
? ?NAME:  Sarah Myers, MRN:  [id number], DOB:  1971-01-20, LOS: 10 ?ADMISSION DATE:  02/05/2022, CONSULTATION DATE:  2/19 ?REFERRING MD:  Selina Cooley - neuro, CHIEF COMPLAINT:  Left sided weakness, slurred speech  ? ?History of Present Illness:  ?51 y/o female came to the [facility] ER on 2/19 with left sided weakness and slurred speech.  Noted to have a right ICH with brain compression present on admission.   ? ?3% saline, cleviprex, intubated ? ?Pertinent  Medical History  ?Hypertension ? ?Significant Hospital Events: ?Including procedures, antibiotic start and stop dates in addition to other pertinent events   ?2/19 admitted to neuro with acute R ICH. Intubated for airway protection. Started on 3% and  cleviprex. PCCM consult. NSGY consult  ?02/06/2022 For repeat CT Head >> Overnight CT showed increase in size of bleed to 36 ml , but unchanged midline shift at 4 mm . Repeating CT head again 2/20 am. Neurosurgery is aware ?2/21: 3% saline stopped due to Na 158. weaned off cleviprex. Remains intubated. ?2/22: neuro exam much improved (moves RUE and RLE to command; thumbs up; blinks eyes to command; sticks tongue out); sinus pause overnight w/ inferiolateral ST elevation overnight; troponin 387 then 269 ?2/23 failed extubation due to stridor. Reintubated ?2/24: Diuresis ?2/26: failed 2nd attempt at extubation. CT head ICH without rebleeding. Hematoma and edema causes 4 mm of midline shift. 2. Bilateral cribriform plate cephalocele. ?2/28 tracheostomy by Kendrick Fries, stopped versed infusion ? ?Micro ?2/19 sars cov 2 / flu > negative ?2/23 resp culture MSSA serratia ? ?Antibiotics ?2/23 cefepime > 3/1 ?2/23 vanc x 1 ? ?Interim History / Subjective:  ? ?Trach yesterday ?Some agitation this morning ?Received benzo bolus ?Remains on fentanyl infusion ?Versed infusion off ? ?Objective   ?Blood pressure (!) 146/80, pulse 61, temperature 99.7 ?F (37.6 ?C), temperature source Axillary, resp. rate 18, height 5\' 4"  (1.626 m), weight 76.7  kg, SpO2 97 %. ?   ?Vent Mode: PRVC ?FiO2 (%):  [30 %] 30 % ?Set Rate:  [20 bmp] 20 bmp ?Vt Set:  [430 mL] 430 mL ?PEEP:  [5 cmH20] 5 cmH20 ?Plateau Pressure:  [17 cmH20-22 cmH20] 17 cmH20  ? ?Intake/Output Summary (Last 24 hours) at 02/15/2022 0725 ?Last data filed at 02/15/2022 0600 ?Gross per 24 hour  ?Intake 1416.59 ml  ?Output 1200 ml  ?Net 216.59 ml  ? ?Filed Weights  ? 02/10/22 0500 02/11/22 0500 02/14/22 0500  ?Weight: 75 kg 75 kg 76.7 kg  ? ? ?Examination: ? ?General:  In bed on vent ?HENT: NCAT tracheostomy in place ?PULM: CTA B, vent supported breathing ?CV: RRR, no mgr ?GI: BS+, soft, nontender ?MSK: normal bulk and tone ?Neuro: sedated on vent ? ? ?Resolved Hospital Problem list   ? ? ?Assessment & Plan:  ?Right temporal hematoma with brain compression > mentation improving ?Agitation, acute encephalopathy due to ICU delirium ?Stop PAD protocol ?D/c fentanyl infusion ?Use low dose versed prn today ?Frequent orientation ?Out of bed ?PT consult> work with patient ?Lights on during daytime, off a night ? ?Acute hypoxemic respiratory failure due to acute cardiogenic pulmonary edema ?Stridor due to upper airway edema, failed extubation x 2 ?Tracheostomy on 2/28 ?Move to trach collar today after fentanyl infusion off ?Trach care per routine ? ?MSSA/Serratia pneumonia, fever on 2/28 due to pneumonia> resolved ?Cefepime to stop after today's dose ? ?Hypertensive emergency> resolving ?Essential hypertension, difficult to control ?Coreg 12.5mg  bid, Losartan 50mg  bid, amlodipine to continue ?Wean clonidine to 0.1 mg bid x3  doses then stop ? ?AKI > mild, improved compared to admission; Cr up a bit after starting losartan ?Monitor BMET and UOP ?Replace electrolytes as needed ? ?Iatrogenic hypernatremia from hypertonic saline given for brain swelling > slowly improving ?Hold free water ? ?Best Practice (right click and "Reselect all SmartList Selections" daily)  ? ?Diet/type: tubefeeds ?DVT prophylaxis: systemic  heparin ?GI prophylaxis: PPI ?Lines: Central line and yes and it is still needed ?Foley:  N/A ?Code Status:  full code ?Last date of multidisciplinary goals of care discussion [2/27, updated daughter [name] on 3/1] ? ?Labs   ?CBC: ?Recent Labs  ?Lab 02/10/22 ?0518 02/12/22 ?0507 02/12/22 ?1522 02/13/22 ?0451 02/14/22 ?8921 02/15/22 ?1941  ?WBC 12.4* 11.6*  --  11.6* 10.8* 9.8  ?HGB 10.6* 11.3* 11.9* 11.3* 10.5* 10.0*  ?HCT 35.6* 35.4* 35.0* 35.7* 32.9* 32.4*  ?MCV 93.4 90.8  --  91.8 91.1 92.3  ?PLT 136* 127*  --  125* 115* 124*  ? ? ?Basic Metabolic Panel: ?Recent Labs  ?Lab 02/08/22 ?1909 02/08/22 ?2335 02/09/22 ?7408 02/09/22 ?1448 02/10/22 ?1931 02/11/22 ?0204 02/12/22 ?0507 02/12/22 ?1856 02/12/22 ?1522 02/13/22 ?0451 02/14/22 ?3149 02/15/22 ?7026  ?NA  --    < > 155*   < > 153*   < > 160* 156* 155* 153* 153* 149*  ?K  --   --  4.3   < > 4.0  --  3.4*  --  4.0 3.9 3.9 4.0  ?CL  --   --  129*   < > 123*  --  128*  --   --  124* 120* 118*  ?CO2  --   --  16*   < > 22  --  26  --   --  25 26 25   ?GLUCOSE  --   --  218*   < > 195*  --  188*  --   --  201* 143* 184*  ?BUN  --   --  37*   < > 43*  --  45*  --   --  45* 38* 43*  ?CREATININE  --   --  1.29*   < > 1.25*  --  1.05*  --   --  1.17* 0.96 1.15*  ?CALCIUM  --   --  9.3   < > 9.5  --  8.9  --   --  9.2 9.8 9.4  ?MG 2.4  --  2.5*  --   --   --   --   --   --   --   --   --   ?PHOS 3.1  --   --   --   --   --   --   --   --   --   --   --   ? < > = values in this interval not displayed.  ? ?GFR: ?Estimated Creatinine Clearance: 59.3 mL/min (A) (by C-G formula based on SCr of 1.15 mg/dL (H)). ?Recent Labs  ?Lab 02/09/22 ?0634 02/10/22 ?0518 02/12/22 ?0507 02/13/22 ?0451 02/14/22 ?02/16/22 02/15/22 ?04/17/22  ?PROCALCITON 0.11  --   --   --   --   --   ?WBC 13.9*   < > 11.6* 11.6* 10.8* 9.8  ? < > = values in this interval not displayed.  ? ? ?Liver Function Tests: ?No results for input(s): AST, ALT, ALKPHOS, BILITOT, PROT, ALBUMIN in the last 168 hours. ? ?No results  for input(s): LIPASE, AMYLASE in the last 168 hours. ?  No results for input(s): AMMONIA in the last 168 hours. ? ?ABG ?   ?Component Value Date/Time  ? PHART 7.359 02/12/2022 1522  ? PCO2ART 53.3 (H) 02/12/2022 1522  ? PO2ART 269 (H) 02/12/2022 1522  ? HCO3 29.8 (H) 02/12/2022 1522  ? TCO2 31 02/12/2022 1522  ? ACIDBASEDEF 6.1 (H) 02/07/2022 0945  ? O2SAT 100 02/12/2022 1522  ?  ? ?Coagulation Profile: ?Recent Labs  ?Lab 02/14/22 ?1101  ?INR 1.1  ? ? ?Cardiac Enzymes: ?No results for input(s): CKTOTAL, CKMB, CKMBINDEX, TROPONINI in the last 168 hours. ? ?HbA1C: ?Hemoglobin A1C  ?Date/Time Value Ref Range Status  ?09/26/2016 11:38 AM 5.3  Final  ? ?Hgb A1c MFr Bld  ?Date/Time Value Ref Range Status  ?02/06/2022 04:32 AM 5.6 4.8 - 5.6 % Final  ?  Comment:  ?  (NOTE) ?Pre diabetes:          5.7%-6.4% ? ?Diabetes:              >6.4% ? ?Glycemic control for   <7.0% ?adults with diabetes ?  ? ? ?CBG: ?Recent Labs  ?Lab 02/14/22 ?1123 02/14/22 ?1531 02/14/22 ?2004 02/14/22 ?2355 02/15/22 ?0356  ?GLUCAP 132* 95 136* 171* 149*  ? ? ?Critical care time: 31 minutes ?  ? ?Heber [place], MD ?[facility] ?Pager: [phone] ?Cell: [phone] ?After 7:00 pm call Elink  [phone] ? ? ? ? ?

## 2022-02-15 NOTE — Progress Notes (Signed)
SLP Cancellation Note ? ?Patient Details ?Name: Sarah Myers ?MRN: [id number] ?DOB: Apr 18, 1971 ? ? ?Cancelled treatment:       Reason Eval/Treat Not Completed: Medical issues which prohibited therapy Patient with new tracheostomy 2/28. Orders for SLP eval and treat for PMSV and swallowing received, but pt is on the vent at this time. SLP will follow pt closely for readiness for SLP interventions as appropriate.  ? ?Sharmon Cheramie I. Vear Clock, MS, CCC-SLP ?Acute Rehabilitation Services ?Office number [phone] ?Pager [phone] ? ?Scheryl Marten ?02/15/2022, 8:20 AM ? ? ?

## 2022-02-15 NOTE — Evaluation (Signed)
Physical Therapy Evaluation ?Patient Details ?Name: Sarah Myers ?MRN: [id number] ?DOB: 1971-04-11 ?Today's Date: 02/15/2022 ? ?History of Present Illness ? 51 y.o. female presents to [facility] hospital on 02/05/2022 with L hemiplegia and dysarthria. 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 Pt intubated 2/19. Attempted extubation 2/23 and 2/26, requiring reintubation same date. Tracheostomy 02/14/2022. PMH includes uncontrolled HTN  ?Clinical Impression ? Pt presents to PT with deficits in strength, power, vision, attention, functional mobility, cardiopulmonary performance. Pt is flaccid on left side and presents with L neglect. Pt does not seem to track visually to either side at this time, unable to mimic PT gestures or touch PT hand despite having sufficient RUE strength and ROM. Pt requires significant assistance to pull into long sitting in bed. Pt will benefit from continued acute PT services in an effort to improve mobility quality and reduce caregiver burden. PT recommends AIR placement pending ability to wean from vent and demonstrate progression of mobility in the acute setting.   ?   ? ?Recommendations for follow up therapy are one component of a multi-disciplinary discharge planning process, led by the attending physician.  Recommendations may be updated based on patient status, additional functional criteria and insurance authorization. ? ?Follow Up Recommendations Acute inpatient rehab (3hours/day) (pending progress if able to wean from vent) ? ?  ?Assistance Recommended at Discharge Frequent or constant Supervision/Assistance  ?Patient can return home with the following ? Two people to help with walking and/or transfers;Two people to help with bathing/dressing/bathroom;Assistance with cooking/housework;Assistance with feeding;Direct supervision/assist for financial management;Direct supervision/assist for medications management;Help with stairs or ramp for entrance;Assist  for transportation ? ?  ?Equipment Recommendations Wheelchair (measurements PT);Wheelchair cushion (measurements PT);Hospital bed (hoyer lift)  ?Recommendations for Other Services ? Rehab consult  ?  ?Functional Status Assessment Patient has had a recent decline in their functional status and demonstrates the ability to make significant improvements in function in a reasonable and predictable amount of time.  ? ?  ?Precautions / Restrictions Precautions ?Precautions: Fall ?Precaution Comments: L neglect ?Restrictions ?Weight Bearing Restrictions: No  ? ?  ? ?Mobility ? Bed Mobility ?Overal bed mobility: Needs Assistance ?  ?  ?  ?  ?  ?  ?General bed mobility comments: from bed in chair position pt able to pull trunk off bed with RUE and maxA ?  ? ?Transfers ?  ?  ?  ?  ?  ?  ?  ?  ?  ?  ?  ? ?Ambulation/Gait ?  ?  ?  ?  ?  ?  ?  ?  ? ?Stairs ?  ?  ?  ?  ?  ? ?Wheelchair Mobility ?  ? ?Modified Rankin (Stroke Patients Only) ?  ? ?  ? ?Balance Overall balance assessment: Needs assistance ?Sitting-balance support: Single extremity supported ?Sitting balance-Leahy Scale: Zero ?Sitting balance - Comments: maxA, posterior lean ?Postural control: Posterior lean ?  ?  ?  ?  ?  ?  ?  ?  ?  ?  ?  ?  ?  ?  ?   ? ? ? ?Pertinent Vitals/Pain Pain Assessment ?Pain Assessment: CPOT ?Facial Expression: Tense ?Body Movements: Restlessness ?Muscle Tension: Relaxed ?Compliance with ventilator (intubated pts.): Tolerating ventilator or movement ?Vocalization (extubated pts.): N/A ?CPOT Total: 3  ? ? ?Home Living Family/patient expects to be discharged to:: Private residence ?Living Arrangements:  (unsure, pt unable to report on trach to vent) ?  ?Type of Home:  House ?  ?  ?  ?  ?Home Layout: Two level ?  ?Additional Comments: difficult to fully assess. PT asking yes/no questions but pt fatiguing at end of session  ?  ?Prior Function Prior Level of Function : Independent/Modified Independent;Working/employed ?  ?  ?  ?  ?  ?  ?Mobility  Comments: pt indicates she was working, very independent ?  ?  ? ? ?Hand Dominance  ?   ? ?  ?Extremity/Trunk Assessment  ? Upper Extremity Assessment ?Upper Extremity Assessment: LUE deficits/detail ?LUE Deficits / Details: flaccid, ROM WFL. no response to painful stimuli ?  ? ?Lower Extremity Assessment ?Lower Extremity Assessment: LLE deficits/detail ?LLE Deficits / Details: flaccid, ROM WFL. No response to painful stimuli ?  ? ?Cervical / Trunk Assessment ?Cervical / Trunk Assessment: Kyphotic  ?Communication  ? Communication: Tracheostomy  ?Cognition Arousal/Alertness: Awake/alert ?Behavior During Therapy: [facility] for tasks assessed/performed ?Overall Cognitive Status: Difficult to assess ?  ?  ?  ?  ?  ?  ?  ?  ?  ?  ?  ?  ?  ?  ?  ?  ?General Comments: pt follows one-step commands with R side, unable with left. Pt answers yes/no questions with head nods or thumbs up/down ?  ?  ? ?  ?General Comments General comments (skin integrity, edema, etc.): pt on 30% FiO2, 5 PEEP, 20 RR trach to vent. VSS during session. Pt does not seem to visually track during session. Eyes do move to right side but not passing midline to left. Pt does not track PT finger and does not seem to turn head to therapist on either side. ? ?  ?Exercises    ? ?Assessment/Plan  ?  ?PT Assessment Patient needs continued PT services  ?PT Problem List Decreased strength;Decreased activity tolerance;Decreased balance;Decreased mobility;Decreased coordination;Decreased knowledge of use of DME;Decreased safety awareness;Decreased knowledge of precautions;Cardiopulmonary status limiting activity;Impaired sensation ? ?   ?  ?PT Treatment Interventions DME instruction;Functional mobility training;Gait training;Therapeutic activities;Therapeutic exercise;Balance training;Neuromuscular re-education;Cognitive remediation;Patient/family education;Wheelchair mobility training   ? ?PT Goals (Current goals can be found in the Care Plan section)  ?Acute Rehab PT  Goals ?Patient Stated Goal: pt unable to state, PT goal to perform bed to chair/wheelchair transfers with one person assist ?PT Goal Formulation: Patient unable to participate in goal setting ?Time For Goal Achievement: 03/01/22 ?Potential to Achieve Goals: Fair ? ?  ?Frequency Min 3X/week ?  ? ? ?Co-evaluation   ?  ?  ?  ?  ? ? ?  ?AM-PAC PT "6 Clicks" Mobility  ?Outcome Measure Help needed turning from your back to your side while in a flat bed without using bedrails?: Total ?Help needed moving from lying on your back to sitting on the side of a flat bed without using bedrails?: Total ?Help needed moving to and from a bed to a chair (including a wheelchair)?: Total ?Help needed standing up from a chair using your arms (e.g., wheelchair or bedside chair)?: Total ?Help needed to walk in hospital room?: Total ?Help needed climbing 3-5 steps with a railing? : Total ?6 Click Score: 6 ? ?  ?End of Session Equipment Utilized During Treatment: Oxygen ?Activity Tolerance: Patient tolerated treatment well ?Patient left: in bed;with call bell/phone within reach;with bed alarm set;with restraints reapplied ?Nurse Communication: Mobility status;Need for lift equipment ?PT Visit Diagnosis: Other abnormalities of gait and mobility (R26.89);Hemiplegia and hemiparesis ?Hemiplegia - Right/Left: Left ?Hemiplegia - caused by: Nontraumatic intracerebral hemorrhage ?  ? ?Time:  7035-0093 ?PT Time Calculation (min) (ACUTE ONLY): 20 min ? ? ?Charges:   PT Evaluation ?$PT Eval High Complexity: 1 High ?  ?  ?   ? ? ?Arlyss Gandy, PT, DPT ?Acute Rehabilitation ?Pager: [phone] ?Office [phone] ? ? ?Arlyss Gandy ?02/15/2022, 4:52 PM ? ?

## 2022-02-15 NOTE — Progress Notes (Signed)
Nutrition Follow-up ? ?DOCUMENTATION CODES:  ? ?Not applicable ? ?INTERVENTION:  ? ?Tube feeding via cortrak: ?Osmolite 1.5 to 55 ml/h (1320 ml per day) ?ProSource TF to 45 ml BID ?  ?Provides 2060 kcal, 105 gm protein, 1006 ml free water daily ?  ?MVI daily per tube ? ?NUTRITION DIAGNOSIS:  ? ?Inadequate oral intake related to inability to eat as evidenced by NPO status. ? ?Ongoing ? ?GOAL:  ? ?Patient will meet greater than or equal to 90% of their needs ? ?Met with TF ? ?MONITOR:  ? ?TF tolerance, I & O's ? ?REASON FOR ASSESSMENT:  ? ?Consult ?Enteral/tube feeding initiation and management ? ?ASSESSMENT:  ? ?Pt with PMH of HTN admitted with acute R ICH  and midline shift. 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 ? ?2/24- cortrak placed (gastric)  ?2/28- s/p trach  ? ?Patient is currently intubated on ventilator support ?MV: 9.2 L/min ?Temp (24hrs), Avg:99.6 ?F (37.6 ?C), Min:98.2 ?F (36.8 ?C), Max:100 ?F (37.8 ?C) ? ?Reviewed I/O's: +217 ml x 24 hours and +4L since admission ? ?UOP: 1.2 L x 24 hours ? ?MAP: 115 ? ?Plan to continue TF via cortrak tube. Per PCCM notes, plan to continue to wean, but pt remains on the ventilator at this time.  ? ?Medications reviewed and include senna.  ? ?Labs reviewed: CBGS: 95-168 (inpatient orders for glycemic control are 0-15 units insulin aspart every 4 hours).   ? ?Diet Order:   ?Diet Order   ? ?       ?  Diet NPO time specified  Diet effective now       ?  ? ?  ?  ? ?  ? ? ?EDUCATION NEEDS:  ? ?Not appropriate for education at this time ? ?Skin:  Skin Assessment: Skin Integrity Issues: ?Skin Integrity Issues:: Stage II ?Stage II: L hand ? ?Last BM:  02/13/22 ? ?Height:  ? ?Ht Readings from Last 1 Encounters:  ?02/05/22 _0  (1.626 m)  ? ? ?Weight:  ? ?Wt Readings from Last 1 Encounters:  ?02/14/22 76.7 kg  ? ?BMI:  Body mass index is 29.02 kg/m?. ? ?Estimated Nutritional Needs:  ? ?Kcal:  1900-2100 ? ?Protein:  105-120 grams ? ?Fluid:  > 1.9  L ? ? ? ?Loistine Chance, RD, LDN, CDCES ?Registered Dietitian II ?Certified Diabetes Care and Education Specialist ?Please refer to [facility] for RD and/or RD on-call/weekend/after hours pager  ?

## 2022-02-16 LAB — GLUCOSE, CAPILLARY
Glucose-Capillary: 109 mg/dL — ABNORMAL HIGH (ref 70–99)
Glucose-Capillary: 140 mg/dL — ABNORMAL HIGH (ref 70–99)
Glucose-Capillary: 144 mg/dL — ABNORMAL HIGH (ref 70–99)
Glucose-Capillary: 146 mg/dL — ABNORMAL HIGH (ref 70–99)
Glucose-Capillary: 154 mg/dL — ABNORMAL HIGH (ref 70–99)
Glucose-Capillary: 209 mg/dL — ABNORMAL HIGH (ref 70–99)
Glucose-Capillary: 246 mg/dL — ABNORMAL HIGH (ref 70–99)

## 2022-02-16 LAB — BASIC METABOLIC PANEL
Anion gap: 6 (ref 5–15)
BUN: 34 mg/dL — ABNORMAL HIGH (ref 6–20)
CO2: 25 mmol/L (ref 22–32)
Calcium: 9.7 mg/dL (ref 8.9–10.3)
Chloride: 115 mmol/L — ABNORMAL HIGH (ref 98–111)
Creatinine, Ser: 0.96 mg/dL (ref 0.44–1.00)
GFR, Estimated: >60 mL/min (ref >60)
Glucose, Bld: 213 mg/dL — ABNORMAL HIGH (ref 70–99)
Potassium: 3.9 mmol/L (ref 3.5–5.1)
Sodium: 146 mmol/L — ABNORMAL HIGH (ref 135–145)

## 2022-02-16 MED ORDER — HYDRALAZINE HCL 20 MG/ML IJ SOLN
10.0000 mg | Freq: Once | INTRAMUSCULAR | Status: AC
  Administered 2022-02-16: 10 mg via INTRAVENOUS
  Filled 2022-02-16: qty 1

## 2022-02-16 MED ORDER — FENTANYL CITRATE PF 50 MCG/ML IJ SOSY
50.0000 ug | PREFILLED_SYRINGE | INTRAMUSCULAR | Status: AC | PRN
  Administered 2022-02-19 (×3): 50 ug via INTRAVENOUS
  Filled 2022-02-16 (×3): qty 1

## 2022-02-16 MED ORDER — CARVEDILOL 12.5 MG PO TABS
25.0000 mg | ORAL_TABLET | Freq: Two times a day (BID) | ORAL | Status: DC
  Administered 2022-02-16 – 2022-03-21 (×65): 25 mg
  Filled 2022-02-16 (×67): qty 2

## 2022-02-16 MED ORDER — HYDRALAZINE HCL 20 MG/ML IJ SOLN
20.0000 mg | Freq: Four times a day (QID) | INTRAMUSCULAR | Status: DC | PRN
  Administered 2022-02-16 – 2022-03-19 (×3): 20 mg via INTRAVENOUS
  Filled 2022-02-16 (×3): qty 1

## 2022-02-16 MED ORDER — SPIRONOLACTONE 25 MG PO TABS
25.0000 mg | ORAL_TABLET | Freq: Every day | ORAL | Status: DC
  Administered 2022-02-16: 25 mg via ORAL
  Filled 2022-02-16 (×2): qty 1

## 2022-02-16 MED ORDER — DOCUSATE SODIUM 50 MG/5ML PO LIQD
100.0000 mg | Freq: Two times a day (BID) | ORAL | Status: DC
  Administered 2022-02-16 – 2022-02-17 (×3): 100 mg
  Filled 2022-02-16 (×5): qty 10

## 2022-02-16 MED ORDER — POLYETHYLENE GLYCOL 3350 17 G PO PACK
17.0000 g | PACK | Freq: Every day | ORAL | Status: DC
  Administered 2022-02-16 – 2022-02-17 (×2): 17 g
  Filled 2022-02-16 (×4): qty 1

## 2022-02-16 MED ORDER — LOSARTAN POTASSIUM 50 MG PO TABS
100.0000 mg | ORAL_TABLET | Freq: Every day | ORAL | Status: DC
  Administered 2022-02-16 – 2022-03-09 (×22): 100 mg
  Filled 2022-02-16 (×22): qty 2

## 2022-02-16 MED ORDER — FENTANYL CITRATE PF 50 MCG/ML IJ SOSY
50.0000 ug | PREFILLED_SYRINGE | INTRAMUSCULAR | Status: DC | PRN
  Administered 2022-02-16: 150 ug via INTRAVENOUS
  Administered 2022-02-16: 100 ug via INTRAVENOUS
  Administered 2022-02-17: 50 ug via INTRAVENOUS
  Administered 2022-02-17: 100 ug via INTRAVENOUS
  Administered 2022-02-17: 50 ug via INTRAVENOUS
  Administered 2022-02-17: 100 ug via INTRAVENOUS
  Administered 2022-02-18 (×3): 50 ug via INTRAVENOUS
  Filled 2022-02-16 (×2): qty 2
  Filled 2022-02-16: qty 3
  Filled 2022-02-16 (×4): qty 1
  Filled 2022-02-16: qty 2
  Filled 2022-02-16: qty 1

## 2022-02-16 NOTE — Progress Notes (Addendum)
STROKE TEAM PROGRESS NOTE  ? ?INTERVAL HISTORY ?Awake, on trach collar, no sedation, communicative. Still moves right side only ?Difficulty with left gaze, but does attempt  ? ?Vitals:  ? 02/16/22 0800 02/16/22 0830 02/16/22 0900 02/16/22 0930  ?BP: (S) (!) 211/92 (!) 213/105 (S) (!) 208/94 (!) 201/99  ?Pulse: 82  80 76  ?Resp: (!) 34  19 (!) 26  ?Temp: 99.1 ?F (37.3 ?C)     ?TempSrc: Oral     ?SpO2: 91%  96% 92%  ?Weight:      ?Height:      ? ?CBC:  ?Recent Labs  ?Lab 02/14/22 ?0438 02/15/22 ?5465  ?WBC 10.8* 9.8  ?HGB 10.5* 10.0*  ?HCT 32.9* 32.4*  ?MCV 91.1 92.3  ?PLT 115* 124*  ? ?Basic Metabolic Panel:  ?Recent Labs  ?Lab 02/15/22 ?6812 02/16/22 ?7517  ?NA 149* 146*  ?K 4.0 3.9  ?CL 118* 115*  ?CO2 25 25  ?GLUCOSE 184* 213*  ?BUN 43* 34*  ?CREATININE 1.15* 0.96  ?CALCIUM 9.4 9.7  ? ?Lipid Panel:  ?Recent Labs  ?Lab 02/12/22 ?0507  ?TRIG 297*  ? ?HgbA1c: No results for input(s): HGBA1C in the last 168 hours. ?Urine Drug Screen: No results for input(s): LABOPIA, COCAINSCRNUR, LABBENZ, AMPHETMU, THCU, LABBARB in the last 168 hours.  ?Alcohol Level No results for input(s): ETH in the last 168 hours. ? ?IMAGING past 24 hours ?No results found. ? ?PHYSICAL EXAM ?Temp:  [98.2 ?F (36.8 ?C)-100 ?F (37.8 ?C)] 100 ?F (37.8 ?C) (03/01 0800) ?Pulse Rate:  [53-94] 81 (03/01 0817) ?Resp:  [15-26] 21 (03/01 0800) ?BP: (103-194)/(51-99) 103/51 (03/01 0909) ?SpO2:  [93 %-100 %] 96 % (03/01 0800) ?FiO2 (%):  [30 %] 30 % (03/01 0726) ?  ?General - Well nourished, well developed, woman intubated and lethargic until sedation decreased, then alert. ?  ?Cardiovascular - Regular rhythm and rate on telemetry.  ?  ?Mental Status -  ?Patient following Rt sided commands , gave thumbs up, squeezed hand, showed two fingers, waved and lifted right leg.  ?Comprehension intact, nodding appropriately to questions ?  ?Cranial Nerves II - XII - ?II - Pupils pinpoint and sluggish. Will look around but not follow finger or examiner.  ?III, IV, VI -  Dysconjugate gaze significantly improved. Oculocephalic reflex intact ?VIII - Hearing appears to be intact bilaterally, as patient follows commands.  ?X - Gag and cough reflex intact ?  ?Motor Strength - Patient withdraws to pain on R side in both extremities, upper extremity movements are more spastic.   ?Plantar Reflexes: Negative Babinski bilaterally. ?  ?Gait and Station - deferred. ?  ?  ?  ?ASSESSMENT/PLAN ?Sarah Myers is a 51 y.o. female with history of HTN presenting with AMS, L sided weakness, and slurred speech. Attempted extubation 2/23 and patient had post-extubation stridor leading to re-intubation. Cords non-edematous.  Re-attempted extubation 2/26, and patient failed leading to reintubation.  Tracheostomy placed 2/28, sedation weaned. Add seroquel and start PT/OT ?  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 Intra-axial blood volume slightly greater than 33 mL. Mass effect with 4-5 mm leftward midline shift; superimposed chronic meningocele progressing since 2016.  ?Repeat CTH at 0045 with 36 mL hemorrhagic area, and 0835 repeat unchanged. ?CTA head & neck No large vessel occlusion, intracranial aneurysm, or abnormal vasculature associated with hemorrhage. 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 ?CT repeat 2/20 stable hematoma and MLS ?Follow-up MRI 2/21 with some extension of edema and stable ICH. ?Follow up CT 2/24 and 2/26 with no progression of ICH; 67mm shift on the latter ?2D Echo LVEF 65-70%, small pericardial effusion present; trival MVR ?LDL 64 ?HgbA1c 5.6 ?VTE prophylaxis - heparin subcu ?No antithrombotic prior to admission, now on No antithrombotic.  ?Therapy recommendations:  Pending PT/OT/SLP eval when patient more stable ?Disposition:  Pending  ?  ?Cerebral edema ?CT head showed midline shift 5 mm;   ?Repeat CT stable hematoma and midline shift ?MRI with some extension of edema and stable ICH, intraventricular extension noted. ?Follow up CT 2/24 with no progression of ICH ?F/U CT 2/26 stable with 52mm shift. ?Saline 75->50->NS->off ?24 hr Na stable 153 ?Na monitoring on daily BMP ?  ?Hypertensive emergency ?SBP as high as 250 on presentation ?Home meds:  Coreg 25 mg BID, HCTZ 25 ?Off of Cleviprex  ?PRN Labetalol 10 mg q2h for SBP >150. Re-initiated as patient has been without bradycardia. ?Continue Norvasc 10 mg and Clonidine 0.1mg  TID, and Coreg 12.5 mg BID ?Hydralazine d/c ?Due to inc Cr, initially d/c'd Losartan ?Cr .96 -> 1.15 ?BP goal less than 160 ?Long-term BP goal normotensive ?  ?Elevated Cr/ AKI, resolved ?24-hour Cr dec from 1.17-0.96 ?Losartan restarted ?BMP monitoring ?Continue TF ?  ?Elevated Troponin ?Downtrending [phone]; EKG with sinus rhythm, PACs, and nonspecific ST wave abnormality, possible inferiolateral ischemia versus 2/2 BP derangements and intubation. ?  ?Respiratory failure ?Intubated in the ED for airway protection ?Extubated ->re-intubated 2/23 and 2/26.   ?Trached 2/28 ?Fentanyl and versed ggt both continued ?On trach collar today ?  ?Other Stroke Risk Factors ?Family hx stroke (Maternal Uncle) ?  ?Other Active Problems ?Leukocytosis: WBC downtrending 11.6-> 10.8, UA negative; Tracheal aspirate culture with moderate WBC, gram pos cocci in pairs and clusters (S. Aureus and S. Marcescens) -> on cefepime (renally dosed by pharmacy) ?Pressure ulcer of L hand ?  ?Hospital day # 10 ?  ? Patient seen and examined by NP/APP with MD. MD to update note as needed.  ?  ?Sarah Picker, Sarah Myers, Sarah Myers ?[facility] ?Pager: [phone] ? ?ATTENDING ATTESTATION: ?This is a patient with a large basal ganglia intracranial hemorrhage with mass effect.  Much improved this morning.  She is able to wave at me.  Not to yes/no questions and follow commands consistently.  Strength is also improved  in the right upper and lower extremity.  She is status post trach on weaning trials.  Blood pressure still elevated  her losartan and Coreg were increased by CCM.  Appreciate their help.   ?  ?Sarah Myers evaluated pt independently, reviewed imaging, chart, labs. Discussed and formulated plan with the resident. Please see  note above for details.    ?  ?This patient is critically ill due to respiratory distress, ICH and at significant risk of neurological worsening, death form heart failure, respiratory failure, recurrent stroke, bleeding from [facility], seizure, sepsis. This patient's care requires constant monitoring of vital signs, hemodynamics, respiratory and cardiac monitoring, review of multiple databases, neurological assessment, discussion with family, other specialists and medical decision making of high complexity. I spent 35 minutes of neurocritical care time in the care of this patient.  ?Sarah Lesser,MD  ?To contact Stroke Continuity provider, please refer to [email or web address]. ?After hours, contact General Neurology  ?

## 2022-02-16 NOTE — Progress Notes (Signed)
? ?NAME:  NIRALYA OHANIAN, MRN:  [id number], DOB:  25-Sep-1971, LOS: 11 ?ADMISSION DATE:  02/05/2022, CONSULTATION DATE:  2/19 ?REFERRING MD:  Selina Cooley - neuro, CHIEF COMPLAINT:  Left sided weakness, slurred speech  ? ?History of Present Illness:  ?51 y/o female came to the [facility] ER on 2/19 with left sided weakness and slurred speech.  Noted to have a right ICH with brain compression present on admission.   ? ?3% saline, cleviprex, intubated ? ?Pertinent  Medical History  ?Hypertension ? ?Significant Hospital Events: ?Including procedures, antibiotic start and stop dates in addition to other pertinent events   ?2/19 admitted to neuro with acute R ICH. Intubated for airway protection. Started on 3% and  cleviprex. PCCM consult. NSGY consult  ?02/06/2022 For repeat CT Head >> Overnight CT showed increase in size of bleed to 36 ml , but unchanged midline shift at 4 mm . Repeating CT head again 2/20 am. Neurosurgery is aware ?2/21: 3% saline stopped due to Na 158. weaned off cleviprex. Remains intubated. ?2/22: neuro exam much improved (moves RUE and RLE to command; thumbs up; blinks eyes to command; sticks tongue out); sinus pause overnight w/ inferiolateral ST elevation overnight; troponin 387 then 269 ?2/23 failed extubation due to stridor. Reintubated ?2/24: Diuresis ?2/26: failed 2nd attempt at extubation. CT head ICH without rebleeding. Hematoma and edema causes 4 mm of midline shift. 2. Bilateral cribriform plate cephalocele. ?2/28 tracheostomy by Kendrick Fries, stopped versed infusion ?3/1 trach collar x1 hour ? ?Micro ?2/19 sars cov 2 / flu > negative ?2/23 resp culture MSSA serratia ? ?Antibiotics ?2/23 cefepime > 3/1 ?2/23 vanc x 1 ? ?Interim History / Subjective:  ? ?Blood pressure up again in the morning ?Lasted about 1 hour off vent yesterday ?Slept on vent ?Now on trach collar ? ?Objective   ?Blood pressure (!) 196/100, pulse 84, temperature 99.7 ?F (37.6 ?C), temperature source Axillary, resp. rate (!) 22, height 5'  4" (1.626 m), weight 72.9 kg, SpO2 96 %. ?   ?Vent Mode: PRVC ?FiO2 (%):  [28 %-50 %] 30 % ?Set Rate:  [20 bmp] 20 bmp ?Vt Set:  [430 mL] 430 mL ?PEEP:  [5 cmH20] 5 cmH20 ?Plateau Pressure:  [13 cmH20-19 cmH20] 17 cmH20  ? ?Intake/Output Summary (Last 24 hours) at 02/16/2022 0750 ?Last data filed at 02/16/2022 0700 ?Gross per 24 hour  ?Intake 1515.7 ml  ?Output 1450 ml  ?Net 65.7 ml  ? ?Filed Weights  ? 02/11/22 0500 02/14/22 0500 02/16/22 0329  ?Weight: 75 kg 76.7 kg 72.9 kg  ? ? ?Examination: ? ?General:  In bed on vent ?HENT: NCAT tracheostomy in place ?PULM: CTA B, vent supported breathing ?CV: RRR, no mgr ?GI: BS+, soft, nontender ?MSK: normal bulk and tone ?Neuro: awake, interactive, nodding head appropriately, moving R arm/leg purposefully ? ? ? ?Resolved Hospital Problem list   ?MSSA/Serratia pneumonia, fever on 2/28 due to pneumonia> resolved ? ?Assessment & Plan:  ?Remains critically ill due to need for mechanical ventilation, encephalopathy and difficult to control hypertension in setting of recent ICH ? ?Right temporal hematoma with brain compression > mentation improving ?Agitation, acute encephalopathy due to ICU delirium > improving ?Frequent orientation ?Continue seroquel ?Out of bed ?PT consult ?Lights on during daytime, off at night ? ?Acute hypoxemic respiratory failure due to acute cardiogenic pulmonary edema > improving ?Stridor due to upper airway edema, failed extubation x 2 ?Tracheostomy on 2/28 ?Trach care per routine ?Goal is trach collar 24 hours ? ?Hypertensive emergency> still difficult  to control essential hypertension ?Increase coreg to 25mg  bid, increase losartan to 100mg  daily, continue amlodipine ?Weaning off clonidine as we don't want this to interfere with her mental status ?Start spironolactone ?Check BMET in AM to monitor for effects from BP meds ? ?AKI > mild, improved  ?Monitor BMET and UOP ?Replace electrolytes as needed ? ?Iatrogenic hypernatremia from hypertonic saline given  for brain swelling > slowly improving ?Monitor ?Hold free water ? ?Best Practice (right click and "Reselect all SmartList Selections" daily)  ? ?Diet/type: tubefeeds ?DVT prophylaxis: systemic heparin ?GI prophylaxis: PPI ?Lines: Central line and yes and it is still needed ?Foley:  N/A ?Code Status:  full code ?Last date of multidisciplinary goals of care discussion [Breosha called on 3/2, no answer, left message] ? ?Labs   ?CBC: ?Recent Labs  ?Lab 02/10/22 ?0518 02/12/22 ?0507 02/12/22 ?1522 02/13/22 ?0451 02/14/22 ?02/15/22 02/15/22 ?6213  ?WBC 12.4* 11.6*  --  11.6* 10.8* 9.8  ?HGB 10.6* 11.3* 11.9* 11.3* 10.5* 10.0*  ?HCT 35.6* 35.4* 35.0* 35.7* 32.9* 32.4*  ?MCV 93.4 90.8  --  91.8 91.1 92.3  ?PLT 136* 127*  --  125* 115* 124*  ? ? ?Basic Metabolic Panel: ?Recent Labs  ?Lab 02/12/22 ?0507 02/12/22 ?02/14/22 02/12/22 ?1522 02/13/22 ?0451 02/14/22 ?02/15/22 02/15/22 ?9629 02/16/22 ?5284  ?NA 160*   < > 155* 153* 153* 149* 146*  ?K 3.4*  --  4.0 3.9 3.9 4.0 3.9  ?CL 128*  --   --  124* 120* 118* 115*  ?CO2 26  --   --  25 26 25 25   ?GLUCOSE 188*  --   --  201* 143* 184* 213*  ?BUN 45*  --   --  45* 38* 43* 34*  ?CREATININE 1.05*  --   --  1.17* 0.96 1.15* 0.96  ?CALCIUM 8.9  --   --  9.2 9.8 9.4 9.7  ? < > = values in this interval not displayed.  ? ?GFR: ?Estimated Creatinine Clearance: 69.4 mL/min (by C-G formula based on SCr of 0.96 mg/dL). ?Recent Labs  ?Lab 02/12/22 ?0507 02/13/22 ?0451 02/14/22 ?02/14/22 02/15/22 ?02/16/22  ?WBC 11.6* 11.6* 10.8* 9.8  ? ? ?Liver Function Tests: ?No results for input(s): AST, ALT, ALKPHOS, BILITOT, PROT, ALBUMIN in the last 168 hours. ? ?No results for input(s): LIPASE, AMYLASE in the last 168 hours. ?No results for input(s): AMMONIA in the last 168 hours. ? ?ABG ?   ?Component Value Date/Time  ? PHART 7.359 02/12/2022 1522  ? PCO2ART 53.3 (H) 02/12/2022 1522  ? PO2ART 269 (H) 02/12/2022 1522  ? HCO3 29.8 (H) 02/12/2022 1522  ? TCO2 31 02/12/2022 1522  ? ACIDBASEDEF 6.1 (H) 02/07/2022 0945  ?  O2SAT 100 02/12/2022 1522  ?  ? ?Coagulation Profile: ?Recent Labs  ?Lab 02/14/22 ?1101  ?INR 1.1  ? ? ?Cardiac Enzymes: ?No results for input(s): CKTOTAL, CKMB, CKMBINDEX, TROPONINI in the last 168 hours. ? ?HbA1C: ?Hemoglobin A1C  ?Date/Time Value Ref Range Status  ?09/26/2016 11:38 AM 5.3  Final  ? ?Hgb A1c MFr Bld  ?Date/Time Value Ref Range Status  ?02/06/2022 04:32 AM 5.6 4.8 - 5.6 % Final  ?  Comment:  ?  (NOTE) ?Pre diabetes:          5.7%-6.4% ? ?Diabetes:              >6.4% ? ?Glycemic control for   <7.0% ?adults with diabetes ?  ? ? ?CBG: ?Recent Labs  ?Lab 02/15/22 ?1129 02/15/22 ?1529 02/15/22 ?2007  02/16/22 ?0001 02/16/22 ?0256  ?GLUCAP 168* 102* 159* 140* 146*  ? ? ?Critical care time: 31 minutes ?  ? ?Heber Rheems, MD ?[facility] ?Pager: [phone] ?Cell: [phone] ?After 7:00 pm call Elink  [phone] ? ? ? ? ?

## 2022-02-16 NOTE — Evaluation (Signed)
Occupational Therapy Evaluation Patient Details Name: Sarah Myers MRN: [id number] DOB: 1971-09-29 Today's Date: 02/16/2022   History of Present Illness 51 y.o. female presents to [facility] hospital on 02/05/2022 with L hemiplegia and dysarthria. 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 Pt intubated 2/19. Attempted extubation 2/23 and 2/26, requiring reintubation same date. Tracheostomy 02/14/2022. PMH includes uncontrolled HTN   Clinical Impression   Patient admitted for the diagnosis above.  PTA needs to be further clarified.  Patient was able to nod yes/no to a few questions, but fatigues quickly. Deficits impacting independence are listed below.  Currently she is needing near total assist with all aspects of ADL and basic bed mobility.  OT will follow in the acute setting, AIR is currently being recommended depending on medical status and ability to wean from the vent.  Vision and command following will need to be further assessed with appropriate goals set.        Recommendations for follow up therapy are one component of a multi-disciplinary discharge planning process, led by the attending physician.  Recommendations may be updated based on patient status, additional functional criteria and insurance authorization.   Follow Up Recommendations  Acute inpatient rehab (3hours/day)    Assistance Recommended at Discharge Frequent or constant Supervision/Assistance  Patient can return home with the following A lot of help with bathing/dressing/bathroom;Two people to help with walking and/or transfers;Assistance with feeding;Help with stairs or ramp for entrance;Assist for transportation;Assistance with cooking/housework;Direct supervision/assist for financial management;Direct supervision/assist for medications management    Functional Status Assessment  Patient has had a recent decline in their functional status and demonstrates the ability to make significant  improvements in function in a reasonable and predictable amount of time.  Equipment Recommendations  Wheelchair (measurements OT);Wheelchair cushion (measurements OT)    Recommendations for Other Services       Precautions / Restrictions Precautions Precautions: Fall Precaution Comments: L neglect Restrictions Weight Bearing Restrictions: No      Mobility Bed Mobility Overal bed mobility: Needs Assistance Bed Mobility: Supine to Sit, Rolling, Sit to Supine Rolling: Total assist   Supine to sit: Total assist, +2 for physical assistance Sit to supine: Total assist, +2 for physical assistance        Transfers                          Balance Overall balance assessment: Needs assistance Sitting-balance support: No upper extremity supported, Feet unsupported Sitting balance-Leahy Scale: Zero   Postural control: Posterior lean, Right lateral lean, Left lateral lean                                 ADL either performed or assessed with clinical judgement   ADL Overall ADL's : Needs assistance/impaired Eating/Feeding: Total assistance   Grooming: Wash/dry face;Moderate assistance;Bed level   Upper Body Bathing: Maximal assistance;Bed level   Lower Body Bathing: Total assistance;Bed level   Upper Body Dressing : Maximal assistance;Bed level   Lower Body Dressing: Total assistance;Bed level   Toilet Transfer: Total assistance   Toileting- Clothing Manipulation and Hygiene: Total assistance;Bed level               Vision   Vision Assessment?: Vision impaired- to be further tested in functional context Alignment/Gaze Preference: Other (comment) (tends to keep eyes in midline) Tracking/Visual Pursuits: Requires cues, head turns, or add eye  shifts to track;Impaired - to be further tested in functional context Additional Comments: patient with difficulties keeping eyes open     Perception Perception Perception:  Impaired Inattention/Neglect: Does not attend to left side of body   Praxis Praxis Praxis: Not tested    Pertinent Vitals/Pain Pain Assessment Pain Assessment: Faces Faces Pain Scale: Hurts little more Pain Location: R leg Pain Descriptors / Indicators: Grimacing, Restless Pain Intervention(s): Repositioned     Hand Dominance Left   Extremity/Trunk Assessment Upper Extremity Assessment Upper Extremity Assessment: LUE deficits/detail LUE Deficits / Details: flaccid, ROM WFL. no response to painful stimuli LUE Sensation: decreased light touch LUE Coordination: decreased fine motor;decreased gross motor   Lower Extremity Assessment Lower Extremity Assessment: Defer to PT evaluation   Cervical / Trunk Assessment Cervical / Trunk Assessment: Kyphotic   Communication Communication Communication: Tracheostomy   Cognition Arousal/Alertness: Awake/alert Behavior During Therapy: Flat affect Overall Cognitive Status: Impaired/Different from baseline Area of Impairment: Orientation, Attention, Following commands, Problem solving                 Orientation Level: Disoriented to, Place Current Attention Level: Focused   Following Commands: Follows one step commands with increased time     Problem Solving: Slow processing, Requires verbal cues, Requires tactile cues       General Comments  pt on tach to vent, VSS during session. Pt incontinent of stool.    Exercises     Shoulder Instructions      Home Living Family/patient expects to be discharged to:: Private residence     Type of Home: House                                  Prior Functioning/Environment Prior Level of Function : Independent/Modified Independent;Working/employed                        OT Problem List: Decreased strength;Decreased range of motion;Decreased activity tolerance;Impaired balance (sitting and/or standing);Impaired vision/perception;Decreased safety  awareness;Decreased cognition;Impaired sensation;Impaired UE functional use;Pain      OT Treatment/Interventions: Self-care/ADL training;Therapeutic exercise;Therapeutic activities;Cognitive remediation/compensation;Visual/perceptual remediation/compensation;Patient/family education;DME and/or AE instruction;Balance training    OT Goals(Current goals can be found in the care plan section) Acute Rehab OT Goals OT Goal Formulation: Patient unable to participate in goal setting Time For Goal Achievement: 03/02/22 Potential to Achieve Goals: Good ADL Goals Pt Will Perform Grooming: with min assist;bed level Additional ADL Goal #1: Patient will roll to the left with Min A to increase independence with toileting. Additional ADL Goal #2: Patient will sit edge of bed with supervison for up to 5 min to increase independence with grooming and UB ADL  OT Frequency: Min 2X/week    Co-evaluation PT/OT/SLP Co-Evaluation/Treatment: Yes Reason for Co-Treatment: Complexity of the patient's impairments (multi-system involvement);For patient/therapist safety PT goals addressed during session: Mobility/safety with mobility;Balance;Strengthening/ROM OT goals addressed during session: ADL's and self-care      AM-PAC OT "6 Clicks" Daily Activity     Outcome Measure Help from another person eating meals?: Total Help from another person taking care of personal grooming?: A Lot Help from another person toileting, which includes using toliet, bedpan, or urinal?: Total Help from another person bathing (including washing, rinsing, drying)?: A Lot Help from another person to put on and taking off regular upper body clothing?: A Lot Help from another person to put on and taking off regular lower body clothing?:  Total 6 Click Score: 9   End of Session Nurse Communication: Mobility status;Other (comment) (Patient with BM)  Activity Tolerance: Patient limited by fatigue Patient left: in bed;with call bell/phone  within reach;with bed alarm set  OT Visit Diagnosis: Muscle weakness (generalized) (M62.81);Other symptoms and signs involving cognitive function;Hemiplegia and hemiparesis Hemiplegia - Right/Left: Left Hemiplegia - dominant/non-dominant: Dominant Hemiplegia - caused by: Nontraumatic intracerebral hemorrhage                Time: 0354-6568 OT Time Calculation (min): 23 min Charges:  OT General Charges $OT Visit: 1 Visit OT Evaluation $OT Eval Moderate Complexity: 1 Mod  02/16/2022  RP, OTR/L  Acute Rehabilitation Services  Office:  [phone]   Suzanna Obey 02/16/2022, 4:37 PM

## 2022-02-16 NOTE — Progress Notes (Signed)
Physical Therapy Treatment ?Patient Details ?Name: Sarah Myers ?MRN: [id number] ?DOB: [birth year] ?Today's Date: 02/16/2022 ? ? ?History of Present Illness 51 y.o. female presents to [facility] hospital on 02/05/2022 with L hemiplegia and dysarthria. 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 Pt intubated 2/19. Attempted extubation 2/23 and 2/26, requiring reintubation same date. Tracheostomy 02/14/2022. PMH includes uncontrolled HTN ? ?  ?PT Comments  ? ? Pt limited by lethargy at times during session, although does arouse to stimulation. Pt remains flaccid on left side with poor ability to track visually. Pt demonstrates fair command following with R side but requires totalA for all functional mobility tasks due to impaired L side and core strength. Pt will benefit from continued attempts at mobilization in an effort to reduce caregiver burden.   ?Recommendations for follow up therapy are one component of a multi-disciplinary discharge planning process, led by the attending physician.  Recommendations may be updated based on patient status, additional functional criteria and insurance authorization. ? ?Follow Up Recommendations ? Acute inpatient rehab (3hours/day) (if patient activity tolerance improves and able to wean vent) ?  ?  ?Assistance Recommended at Discharge Frequent or constant Supervision/Assistance  ?Patient can return home with the following Two people to help with walking and/or transfers;Two people to help with bathing/dressing/bathroom;Assistance with cooking/housework;Assistance with feeding;Direct supervision/assist for financial management;Direct supervision/assist for medications management;Help with stairs or ramp for entrance;Assist for transportation ?  ?Equipment Recommendations ? Wheelchair (measurements PT);Wheelchair cushion (measurements PT);Hospital bed;Other (comment) (hoyer lift)  ?  ?Recommendations for Other Services   ? ? ?  ?Precautions / Restrictions  Precautions ?Precautions: Fall ?Precaution Comments: L neglect ?Restrictions ?Weight Bearing Restrictions: No  ?  ? ?Mobility ? Bed Mobility ?Overal bed mobility: Needs Assistance ?Bed Mobility: Supine to Sit, Rolling, Sit to Supine ?Rolling: Total assist ?  ?Supine to sit: Total assist, +2 for physical assistance ?Sit to supine: Total assist, +2 for physical assistance ?  ?General bed mobility comments: pt initiates reaching with RUE but requires >75% assistance for all mobility at this time. Pt rolls left and right for hygiene tasks ?  ? ?Transfers ?  ?  ?  ?  ?  ?  ?  ?  ?  ?  ?  ? ?Ambulation/Gait ?  ?  ?  ?  ?  ?  ?  ?  ? ? ?Stairs ?  ?  ?  ?  ?  ? ? ?Wheelchair Mobility ?  ? ?Modified Rankin (Stroke Patients Only) ?  ? ? ?  ?Balance Overall balance assessment: Needs assistance ?Sitting-balance support: No upper extremity supported, Feet unsupported ?Sitting balance-Leahy Scale: Zero ?Sitting balance - Comments: totalA, posterior lean, no postural righting reactions noted ?Postural control: Posterior lean ?  ?  ?  ?  ?  ?  ?  ?  ?  ?  ?  ?  ?  ?  ?  ? ?  ?Cognition Arousal/Alertness: Awake/alert (awakens with stimulation) ?Behavior During Therapy: Flat affect ?Overall Cognitive Status: Impaired/Different from baseline ?Area of Impairment: Orientation, Attention, Following commands, Problem solving ?  ?  ?  ?  ?  ?  ?  ?  ?Orientation Level: Disoriented to, Place ?Current Attention Level: Focused ?  ?Following Commands: Follows one step commands with increased time (pt follows 50-75% of motor commands with R side) ?  ?  ?Problem Solving: Slow processing, Requires verbal cues, Requires tactile cues ?  ?  ?  ? ?  ?  Exercises   ? ?  ?General Comments General comments (skin integrity, edema, etc.): pt on tach to vent, VSS during session. Pt incontinent of stool. ?  ?  ? ?Pertinent Vitals/Pain Pain Assessment ?Pain Assessment: CPOT ?Facial Expression: Relaxed, neutral ?Body Movements: Restlessness ?Muscle Tension:  Relaxed ?Compliance with ventilator (intubated pts.): N/A ?Vocalization (extubated pts.): N/A ?CPOT Total: 2  ? ? ?Home Living   ?  ?  ?  ?  ?  ?  ?  ?  ?  ?   ?  ?Prior Function    ?  ?  ?   ? ?PT Goals (current goals can now be found in the care plan section) Acute Rehab PT Goals ?Patient Stated Goal: pt unable to state, PT goal to perform bed to chair/wheelchair transfers with one person assist ?Progress towards PT goals: Progressing toward goals (able to tolerate edge of bed, still totalA) ? ?  ?Frequency ? ? ? Min 3X/week ? ? ? ?  ?PT Plan Current plan remains appropriate  ? ? ?Co-evaluation PT/OT/SLP Co-Evaluation/Treatment: Yes ?Reason for Co-Treatment: Complexity of the patient's impairments (multi-system involvement);Necessary to address cognition/behavior during functional activity;For patient/therapist safety ?PT goals addressed during session: Mobility/safety with mobility;Balance;Strengthening/ROM ?  ?  ? ?  ?AM-PAC PT "6 Clicks" Mobility   ?Outcome Measure ? Help needed turning from your back to your side while in a flat bed without using bedrails?: Total ?Help needed moving from lying on your back to sitting on the side of a flat bed without using bedrails?: Total ?Help needed moving to and from a bed to a chair (including a wheelchair)?: Total ?Help needed standing up from a chair using your arms (e.g., wheelchair or bedside chair)?: Total ?Help needed to walk in hospital room?: Total ?Help needed climbing 3-5 steps with a railing? : Total ?6 Click Score: 6 ? ?  ?End of Session Equipment Utilized During Treatment: Oxygen ?Activity Tolerance: Patient tolerated treatment well ?Patient left: in bed;with call bell/phone within reach;with bed alarm set;with restraints reapplied ?Nurse Communication: Mobility status;Need for lift equipment ?PT Visit Diagnosis: Other abnormalities of gait and mobility (R26.89);Hemiplegia and hemiparesis ?Hemiplegia - Right/Left: Left ?Hemiplegia - dominant/non-dominant:  Dominant ?Hemiplegia - caused by: Nontraumatic intracerebral hemorrhage ?  ? ? ?Time: [id number] ?PT Time Calculation (min) (ACUTE ONLY): 28 min ? ?Charges:  $Therapeutic Activity: 8-22 mins          ?          ? ?Zenaida Niece, PT, DPT ?Acute Rehabilitation ?Pager: [phone] ?Office [phone] ? ? ? ?Zenaida Niece ?02/16/2022, 3:40 PM ? ?

## 2022-02-16 NOTE — Progress Notes (Signed)
Patient with continuously high BP due to consistent movement. Patient given multiple doses of labetalol an versed throughout night, however pressures read normal when patient is sleeping and not moving. BP's no accurate when patient is awake in constant motion.  ?

## 2022-02-16 NOTE — Progress Notes (Signed)
eLink Physician-Brief Progress Note ?Patient Name: Sarah Myers ?DOB: [birth year] ?MRN: [id number] ? ? ?Date of Service ? 02/16/2022  ?HPI/Events of Note ? Patient with agitation and BP 219/110 despite PRN labetalol and versed. ? ?Patient banging extremities on the bedrails. Unable to follow commands  ?eICU Interventions ? Added PRN fentanyl ?Give IV hydralazine 10 mg now ?  ? ? ? ?Intervention Category ?Intermediate Interventions: Hypertension - evaluation and management ?Minor Interventions: Agitation / anxiety - evaluation and management ? ?Kimarie Coor Mechele Collin ?02/16/2022, 6:02 AM ?

## 2022-02-16 NOTE — Progress Notes (Signed)
Contacted E-Link to discuss patient BP at this time.  ?

## 2022-02-17 LAB — BASIC METABOLIC PANEL
Anion gap: 9 (ref 5–15)
BUN: 35 mg/dL — ABNORMAL HIGH (ref 6–20)
CO2: 26 mmol/L (ref 22–32)
Calcium: 9.9 mg/dL (ref 8.9–10.3)
Chloride: 112 mmol/L — ABNORMAL HIGH (ref 98–111)
Creatinine, Ser: 1.06 mg/dL — ABNORMAL HIGH (ref 0.44–1.00)
GFR, Estimated: >60 mL/min (ref >60)
Glucose, Bld: 193 mg/dL — ABNORMAL HIGH (ref 70–99)
Potassium: 4.1 mmol/L (ref 3.5–5.1)
Sodium: 147 mmol/L — ABNORMAL HIGH (ref 135–145)

## 2022-02-17 LAB — GLUCOSE, CAPILLARY
Glucose-Capillary: 173 mg/dL — ABNORMAL HIGH (ref 70–99)
Glucose-Capillary: 176 mg/dL — ABNORMAL HIGH (ref 70–99)
Glucose-Capillary: 127 mg/dL — ABNORMAL HIGH (ref 70–99)
Glucose-Capillary: 130 mg/dL — ABNORMAL HIGH (ref 70–99)
Glucose-Capillary: 144 mg/dL — ABNORMAL HIGH (ref 70–99)
Glucose-Capillary: 173 mg/dL — ABNORMAL HIGH (ref 70–99)

## 2022-02-17 MED ORDER — LABETALOL HCL 5 MG/ML IV SOLN
10.0000 mg | Freq: Once | INTRAVENOUS | Status: AC
  Administered 2022-02-17: 10 mg via INTRAVENOUS
  Filled 2022-02-17: qty 4

## 2022-02-17 MED ORDER — CLONIDINE HCL 0.1 MG PO TABS
0.1000 mg | ORAL_TABLET | Freq: Two times a day (BID) | ORAL | Status: DC
  Administered 2022-02-17: 0.1 mg via ORAL
  Filled 2022-02-17 (×2): qty 1

## 2022-02-17 MED ORDER — OXYCODONE HCL 5 MG PO TABS
5.0000 mg | ORAL_TABLET | Freq: Four times a day (QID) | ORAL | Status: DC | PRN
  Administered 2022-02-17 – 2022-03-05 (×25): 5 mg
  Filled 2022-02-17 (×25): qty 1

## 2022-02-17 MED ORDER — CLONIDINE HCL 0.1 MG PO TABS
0.1000 mg | ORAL_TABLET | Freq: Two times a day (BID) | ORAL | Status: DC
  Administered 2022-02-17: 0.1 mg

## 2022-02-17 MED ORDER — SPIRONOLACTONE 25 MG PO TABS
50.0000 mg | ORAL_TABLET | Freq: Every day | ORAL | Status: DC
  Administered 2022-02-17: 50 mg via ORAL
  Filled 2022-02-17: qty 2

## 2022-02-17 MED ORDER — CLONAZEPAM 1 MG PO TABS
1.0000 mg | ORAL_TABLET | Freq: Two times a day (BID) | ORAL | Status: DC
  Administered 2022-02-17 (×2): 1 mg
  Filled 2022-02-17 (×2): qty 1

## 2022-02-17 NOTE — Progress Notes (Signed)
BP remains uncontrolled despite admin of scheduled antihypertensives, PRN list exhausted as well. Current BP 178/100. MD notified, new orders for Clonidine 0.1 mg placed.  ?

## 2022-02-17 NOTE — Progress Notes (Signed)
SLP Cancellation Note ? ?Patient Details ?Name: Sarah Myers ?MRN: [id number] ?DOB: 21-Sep-1971 ? ? ?Cancelled treatment:        Attempted to see pt for PMSV assessment and possible swallowing evaluation.  Pt on TCT but lethargic at time of attempt 2/2 sedative medication.  RN believes pt should become more alert later this morning.  SLP will reattempt as schedule permits.  ? ? ?Juddson Cobern E Marlaine Arey, MA, CCC-SLP ?Acute Rehabilitation Services ?Office: [phone]  ?02/17/2022, 10:27 AM ?

## 2022-02-17 NOTE — Evaluation (Signed)
Clinical/Bedside Swallow Evaluation ?Patient Details  ?Name: Sarah Myers ?MRN: [id number] ?Date of Birth: 1971-01-09 ? ?Today's Date: 02/17/2022 ?Time: SLP Start Time (ACUTE ONLY): 1210 SLP Stop Time (ACUTE ONLY): 1225 ?SLP Time Calculation (min) (ACUTE ONLY): 15 min ? ?Past Medical History:  ?Past Medical History:  ?Diagnosis Date  ? Hypertension   ? ?Past Surgical History:  ?Past Surgical History:  ?Procedure Laterality Date  ? BREAST LUMPECTOMY  1992  ? CESAREAN SECTION    ? ORIF RADIAL FRACTURE  02/10/2012  ? Procedure: OPEN REDUCTION INTERNAL FIXATION (ORIF) RADIAL FRACTURE;  Surgeon: Cammy Copa, MD;  Location: [facility] OR;  Service: Orthopedics;  Laterality: Right;  ? ORIF ULNAR FRACTURE  02/10/2012  ? Procedure: OPEN REDUCTION INTERNAL FIXATION (ORIF) ULNAR FRACTURE;  Surgeon: Cammy Copa, MD;  Location: [facility];  Service: Orthopedics;  Laterality: Right;  ? TUBAL LIGATION    ? ?HPI:  ?51 y/o female came to the [facility] ER on 2/19 with left sided weakness and slurred speech.  Noted to have a right ICH with brain compression present on admission.  CT 2/26: "ICH without rebleeding. Hematoma and edema causes 4 mm of midline shift."  Intubated 2/19. Tracheostomy 2/28 (shiley 6, cuffed).  ?  ?Assessment / Plan / Recommendation  ?Clinical Impression ? Pt presents with increased risk for aspiration in setting of R ICH with 9 day intubation and new tracheostomy 2/28.  Pt was unable to follow directions for OME, but has L sided deficits from ICH.  Pt was given PO trials of ice chips and thin liquid without PMV in place, given poor tolerance of valve.  With ice chip, pt responded to thermal stimulation to lips and attempted to retrieve bolus but exhibited difficult.  Pt with oral response to ice chip placed in oral cavity, but unable to effectively masticate.  Pharyngeal swallow reflex noted.  With small amounts of water by spoon, pt exhibited more prompt swallow reponse.  There was double swallow required on 1 of 3  trials.  No overt s/s of aspiration and fair to good laryngeal elevation on palpation.  There were no change in vital signs with PO trials.  Pt is not appropriate for further evaluation of swallow function at this time, but would likely benefit from instrumental asessment of swallowing prior to initiation of PO diet given new tracheostomy.   ? ?Recommend pt remain NPO with alternate means of nutrition, hydration, and medication. Pt may have ice chips and small amounts of water by teaspoon for comfort after good oral care, in moderation, when fully awake/alert, with upright positioning and supervision.  SLP to follow for PO readiness and likely instrumental swallow evaluation. ? ? ?SLP Visit Diagnosis: Dysphagia, oropharyngeal phase (R13.12) ?   ?Aspiration Risk ? Moderate aspiration risk  ?  ?Diet Recommendation NPO;Alternative means - temporary  ? ?   ?  ?Other  Recommendations Oral Care Recommendations: Oral care QID;Staff/trained caregiver to provide oral care;Oral care prior to ice chip/H20   ? ?Recommendations for follow up therapy are one component of a multi-disciplinary discharge planning process, led by the attending physician.  Recommendations may be updated based on patient status, additional functional criteria and insurance authorization. ? ?Follow up Recommendations Acute inpatient rehab (3hours/day)  ? ? ?  ?Assistance Recommended at Discharge Frequent or constant Supervision/Assistance  ?Functional Status Assessment Patient has had a recent decline in their functional status and demonstrates the ability to make significant improvements in function in a reasonable and predictable amount of  time.  ?Frequency and Duration min 2x/week  ?2 weeks ?  ?   ? ?Prognosis   Good, with continued recovery ? ?  ? ?Swallow Study   ?General Date of Onset: 02/05/22 ?HPI: 51 y/o female came to the [facility] ER on 2/19 with left sided weakness and slurred speech.  Noted to have a right ICH with brain compression present on  admission.  CT 2/26: "ICH without rebleeding. Hematoma and edema causes 4 mm of midline shift."  Intubated 2/19. Tracheostomy 2/28 (shiley 6, cuffed). ?Type of Study: Bedside Swallow Evaluation ?Diet Prior to this Study: NPO ?Respiratory Status: Trach Collar ?History of Recent Intubation: Yes ?Length of Intubations (days): 9 days ?Date extubated:  (Tracheostomy 2/28) ?Behavior/Cognition: Lethargic/Drowsy;Doesn't follow directions ?Oral Cavity Assessment: Within Functional Limits ?Oral Care Completed by SLP: Recent completion by staff ?Oral Cavity - Dentition: Adequate natural dentition ?Self-Feeding Abilities: Total assist ?Patient Positioning: Upright in bed ?Baseline Vocal Quality: Wet ?Volitional Cough: Cognitively unable to elicit ?Volitional Swallow: Unable to elicit  ?  ?Oral/Motor/Sensory Function Overall Oral Motor/Sensory Function:  (Unable to assess; L sided deficits from ICH)   ?Ice Chips Ice chips: Impaired ?Presentation: Spoon ?Oral Phase Impairments: Impaired mastication (difficulty retrieving bolus from spoon) ?Oral Phase Functional Implications: Prolonged oral transit   ?Thin Liquid Thin Liquid: Impaired ?Presentation: Spoon ?Pharyngeal  Phase Impairments: Multiple swallows;Suspected delayed Swallow;Wet Vocal Quality  ?  ?Nectar Thick Nectar Thick Liquid: Not tested   ?Honey Thick Honey Thick Liquid: Not tested   ?Puree Puree: Not tested   ?Solid ? ? ?  Solid: Not tested  ? ?  ? ?[name] E Breyson Kelm , MA, CCC-SLP ?Acute Rehabilitation Services ?Office: [phone] ?02/17/2022,1:08 PM ? ? ? ?

## 2022-02-17 NOTE — Evaluation (Addendum)
Passy-Muir Speaking Valve - Evaluation ?Patient Details  ?Name: Sarah Myers ?MRN: [id number] ?Date of Birth: 04-19-1971 ? ?Today's Date: 02/17/2022 ?Time: 2376-2831 ?SLP Time Calculation (min) (ACUTE ONLY): 18 min ? ?Past Medical History:  ?Past Medical History:  ?Diagnosis Date  ? Hypertension   ? ?Past Surgical History:  ?Past Surgical History:  ?Procedure Laterality Date  ? BREAST LUMPECTOMY  1992  ? CESAREAN SECTION    ? ORIF RADIAL FRACTURE  02/10/2012  ? Procedure: OPEN REDUCTION INTERNAL FIXATION (ORIF) RADIAL FRACTURE;  Surgeon: Cammy Copa, MD;  Location: [facility] OR;  Service: Orthopedics;  Laterality: Right;  ? ORIF ULNAR FRACTURE  02/10/2012  ? Procedure: OPEN REDUCTION INTERNAL FIXATION (ORIF) ULNAR FRACTURE;  Surgeon: Cammy Copa, MD;  Location: [facility] OR;  Service: Orthopedics;  Laterality: Right;  ? TUBAL LIGATION    ? ?HPI:  ?51 y/o female came to the [facility] ER on 2/19 with left sided weakness and slurred speech.  Noted to have a right ICH with brain compression present on admission.  CT 2/26: "ICH without rebleeding. Hematoma and edema causes 4 mm of midline shift."  Intubated 2/19. Tracheostomy 2/28 (shiley 6, cuffed).  ? ? ?Assessment / Plan / Recommendation ? ?Clinical Impression ? Pt asleep on SLP arrival, but able to rouse.  Some eye opening, but pt was unable to follow directions.  Pt was unable to tolerate PMSV for prolonged trials. Cuff deflated on SLP arrival. With valve placement RR increased from 19 to 29, pt with increasing restlessness, semingly short of breath, tear production observed.  SpO2 remained stable in mid 90s.  Blood pressure increased during PMV trials with increasing agitation from pt.  After trials were stopped BP stabilized and RN did not have to give medication.  Pt was able to direct air through upper airway, at least in part.  Coughing and some phonation noted, but no attempts to verbalize.  Vocal quality wet/gurgly.  RN provided tracheal suctioning with little  output.  This did not impact tolerance of valve. Suspect some breathstacking.  Backpressure noted intermittently on valve removal.  Pt to wear valve during trials with SLP only. Valve left in room. ? ?Daughter [name] present for assessment. Provided education of purpose and function of PMV.  Daughter asked thoughtful questions to provide family update.  Answered questions and provided PMSV patient education handbook. ? ?SLP to follow for continued PMV trials.  Pt may benefit from downsizing of trach when medically appropriate. ? ?SLP Visit Diagnosis: Aphonia (R49.1)  ?  ?SLP Assessment ? Patient needs continued Speech Lanaguage Pathology Services  ?  ?Recommendations for follow up therapy are one component of a multi-disciplinary discharge planning process, led by the attending physician.  Recommendations may be updated based on patient status, additional functional criteria and insurance authorization. ? ?Follow Up Recommendations ?   ? ?  ?Assistance Recommended at Discharge Frequent or constant Supervision/Assistance  ?Functional Status Assessment Patient has had a recent decline in their functional status and demonstrates the ability to make significant improvements in function in a reasonable and predictable amount of time.  ?Frequency and Duration min 2x/week  ?2 weeks ?  ? ?PMSV Trial PMSV was placed for: ~10 seconds ?Able to redirect subglottic air through upper airway: Yes ?Able to Attain Phonation: Yes ?Voice Quality: Wet ?Able to Expectorate Secretions: Yes ?Level of Secretion Expectoration with PMSV: Tracheal ?Breath Support for Phonation: Inadequate ?Intelligibility: Unable to assess (comment) ?Respirations During Trial: 28 ?SpO2 During Trial: 94 % ?Behavior: Poor eye  contact;No attempt to communicate ?  ?Tracheostomy Tube ?    ?  ?Vent Dependency ? FiO2 (%): 28 %  ?  ?Cuff Deflation Trial    ?   ? ? ?  ?Selene Peltzer E Leili Eskenazi , MA, CCC-SLP ?Acute Rehabilitation Services ?Office: [phone] ?02/17/2022, 12:55  PM ? ?

## 2022-02-17 NOTE — Progress Notes (Signed)
? ?NAME:  Sarah Myers, MRN:  [id number], DOB:  [birth year], LOS: 12 ?ADMISSION DATE:  02/05/2022, CONSULTATION DATE:  2/19 ?REFERRING MD:  Selina Cooley - neuro, CHIEF COMPLAINT:  Left sided weakness, slurred speech  ? ?History of Present Illness:  ?51 y/o female came to the [facility] ER on 2/19 with left sided weakness and slurred speech.  Noted to have a right ICH with brain compression present on admission.   ? ?3% saline, cleviprex, intubated ? ?Pertinent  Medical History  ?Hypertension ? ?Significant Hospital Events: ?Including procedures, antibiotic start and stop dates in addition to other pertinent events   ?2/19 admitted to neuro with acute R ICH. Intubated for airway protection. Started on 3% and  cleviprex. PCCM consult. NSGY consult  ?02/06/2022 For repeat CT Head >> Overnight CT showed increase in size of bleed to 36 ml , but unchanged midline shift at 4 mm . Repeating CT head again 2/20 am. Neurosurgery is aware ?2/21: 3% saline stopped due to Na 158. weaned off cleviprex. Remains intubated. ?2/22: neuro exam much improved (moves RUE and RLE to command; thumbs up; blinks eyes to command; sticks tongue out); sinus pause overnight w/ inferiolateral ST elevation overnight; troponin 387 then 269 ?2/23 failed extubation due to stridor. Reintubated ?2/24: Diuresis ?2/26: failed 2nd attempt at extubation. CT head ICH without rebleeding. Hematoma and edema causes 4 mm of midline shift. 2. Bilateral cribriform plate cephalocele. ?2/28 tracheostomy by Kendrick Fries, stopped versed infusion ?3/1 trach collar x1 hour ?3/2 worked with PT ? ?Micro ?2/19 sars cov 2 / flu > negative ?2/23 resp culture MSSA serratia ? ?Antibiotics ?2/23 cefepime > 3/1 ?2/23 vanc x 1 ? ?Interim History / Subjective:  ? ?Trach collar yesterday for about 5-6 hours ?Increased coreg and losartan ?Pulled at her cortak tube  ? ? ?Objective   ?Blood pressure (!) 165/89, pulse 74, temperature 99.1 ?F (37.3 ?C), temperature source Axillary, resp. rate 16, height  5\' 4"  (1.626 m), weight 72.9 kg, SpO2 95 %. ?   ?Vent Mode: PRVC ?FiO2 (%):  [28 %-30 %] 28 % ?Set Rate:  [20 bmp] 20 bmp ?Vt Set:  [430 mL] 430 mL ?PEEP:  [5 cmH20] 5 cmH20 ?Plateau Pressure:  [17 cmH20] 17 cmH20  ? ?Intake/Output Summary (Last 24 hours) at 02/17/2022 0910 ?Last data filed at 02/17/2022 0900 ?Gross per 24 hour  ?Intake 1405 ml  ?Output 1450 ml  ?Net -45 ml  ? ?Filed Weights  ? 02/11/22 0500 02/14/22 0500 02/16/22 0329  ?Weight: 75 kg 76.7 kg 72.9 kg  ? ? ?Examination: ? ?General:  In bed on vent ?HENT: NCAT tracheostomy in place ?PULM: CTA B, vent supported breathing ?CV: RRR, no mgr ?GI: BS+, soft, nontender ?MSK: normal bulk and tone ?Neuro: asleep on my exam ? ? ?Resolved Hospital Problem list   ?MSSA/Serratia pneumonia, fever on 2/28 due to pneumonia> resolved ?AKI > resolved ? ?Assessment & Plan:  ?Remains critically ill due to need for mechanical ventilation, encephalopathy and difficult to control hypertension in setting of recent ICH ? ?Right temporal hematoma with brain compression > mentation improving ?Agitation, acute encephalopathy due to ICU delirium > improving ?Daughter says that she is anxious at baseline but doesn't take medications ?Frequent orientation ?Continue seroquel ?Out of bed ?PT consult ?Lights on during daytime, off at night ?Add clonazepam scheduled ?Add oxycodone prn ?Try to avoid versed ? ?Acute hypoxemic respiratory failure due to acute cardiogenic pulmonary edema > improving ?Stridor due to upper airway edema, failed extubation x 2 ?  Tracheostomy on 2/28 ?Trach care per routine ?Goal is trach collar 24 hours, but didn't make it yesterday ?Order CXR today ?Trach collar as long as possible ? ?Hypertensive emergency> still difficult to control essential hypertension ?Continue coreg, losartan, amlodipine ?Increase spironolactone to 50mg  daily  ?Consider adding HCTZ ? ?Iatrogenic hypernatremia from hypertonic saline given for brain swelling > slowly improving ?Monitor ?Hold  free water ? ? ?Best Practice (right click and "Reselect all SmartList Selections" daily)  ? ?Diet/type: tubefeeds ?DVT prophylaxis: systemic heparin ?GI prophylaxis: PPI ?Lines: Central line and yes and it is still needed ?Foley:  N/A ?Code Status:  full code ?Last date of multidisciplinary goals of care discussion [Breosha called on 3/2, no answer, left message] ? ?Labs   ?CBC: ?Recent Labs  ?Lab 02/12/22 ?0507 02/12/22 ?1522 02/13/22 ?0451 02/14/22 ?02/16/22 02/15/22 ?04/17/22  ?WBC 11.6*  --  11.6* 10.8* 9.8  ?HGB 11.3* 11.9* 11.3* 10.5* 10.0*  ?HCT 35.4* 35.0* 35.7* 32.9* 32.4*  ?MCV 90.8  --  91.8 91.1 92.3  ?PLT 127*  --  125* 115* 124*  ? ? ?Basic Metabolic Panel: ?Recent Labs  ?Lab 02/13/22 ?0451 02/14/22 ?0438 02/15/22 ?04/17/22 02/16/22 ?04/18/22 02/17/22 ?04/19/22  ?NA 153* 153* 149* 146* 147*  ?K 3.9 3.9 4.0 3.9 4.1  ?CL 124* 120* 118* 115* 112*  ?CO2 25 26 25 25 26   ?GLUCOSE 201* 143* 184* 213* 193*  ?BUN 45* 38* 43* 34* 35*  ?CREATININE 1.17* 0.96 1.15* 0.96 1.06*  ?CALCIUM 9.2 9.8 9.4 9.7 9.9  ? ?GFR: ?Estimated Creatinine Clearance: 62.8 mL/min (A) (by C-G formula based on SCr of 1.06 mg/dL (H)). ?Recent Labs  ?Lab 02/12/22 ?0507 02/13/22 ?0451 02/14/22 ?02/15/22 02/15/22 ?8101  ?WBC 11.6* 11.6* 10.8* 9.8  ? ? ?Liver Function Tests: ?No results for input(s): AST, ALT, ALKPHOS, BILITOT, PROT, ALBUMIN in the last 168 hours. ? ?No results for input(s): LIPASE, AMYLASE in the last 168 hours. ?No results for input(s): AMMONIA in the last 168 hours. ? ?ABG ?   ?Component Value Date/Time  ? PHART 7.359 02/12/2022 1522  ? PCO2ART 53.3 (H) 02/12/2022 1522  ? PO2ART 269 (H) 02/12/2022 1522  ? HCO3 29.8 (H) 02/12/2022 1522  ? TCO2 31 02/12/2022 1522  ? ACIDBASEDEF 6.1 (H) 02/07/2022 0945  ? O2SAT 100 02/12/2022 1522  ?  ? ?Coagulation Profile: ?Recent Labs  ?Lab 02/14/22 ?1101  ?INR 1.1  ? ? ?Cardiac Enzymes: ?No results for input(s): CKTOTAL, CKMB, CKMBINDEX, TROPONINI in the last 168 hours. ? ?HbA1C: ?Hemoglobin A1C  ?Date/Time  Value Ref Range Status  ?09/26/2016 11:38 AM 5.3  Final  ? ?Hgb A1c MFr Bld  ?Date/Time Value Ref Range Status  ?02/06/2022 04:32 AM 5.6 4.8 - 5.6 % Final  ?  Comment:  ?  (NOTE) ?Pre diabetes:          5.7%-6.4% ? ?Diabetes:              >6.4% ? ?Glycemic control for   <7.0% ?adults with diabetes ?  ? ? ?CBG: ?Recent Labs  ?Lab 02/16/22 ?1557 02/16/22 ?1949 02/16/22 ?2343 02/17/22 ?04/18/22 02/17/22 ?0815  ?GLUCAP 246* 144* 209* 173* 176*  ? ? ?Critical care time: n/a minutes ?  ? ?[id number], MD ?[facility] ?Pager: [phone] ?Cell: [phone] ?After 7:00 pm call Elink  [phone] ? ? ? ? ?

## 2022-02-17 NOTE — Progress Notes (Addendum)
STROKE TEAM PROGRESS NOTE  ? ?INTERVAL HISTORY ?Patient is seen in her room with no family at the bedside.  She is on trach collar and has been able to tolerate it for about two hours. She has been hemodynamically stable and her neurological exam is stable. ? ?Vitals:  ? 02/17/22 1100 02/17/22 1200 02/17/22 1205 02/17/22 1300  ?BP: (!) 147/80 (!) 186/86  (!) 177/91  ?Pulse: 77 93 95 90  ?Resp: 15 (!) 21 (!) 21 (!) 24  ?Temp:  99.8 ?F (37.7 ?C)    ?TempSrc:  Axillary    ?SpO2: 92% 95% 95% 92%  ?Weight:      ?Height:      ? ?CBC:  ?Recent Labs  ?Lab 02/14/22 ?0438 02/15/22 ?2010  ?WBC 10.8* 9.8  ?HGB 10.5* 10.0*  ?HCT 32.9* 32.4*  ?MCV 91.1 92.3  ?PLT 115* 124*  ? ? ?Basic Metabolic Panel:  ?Recent Labs  ?Lab 02/16/22 ?0712 02/17/22 ?1975  ?NA 146* 147*  ?K 3.9 4.1  ?CL 115* 112*  ?CO2 25 26  ?GLUCOSE 213* 193*  ?BUN 34* 35*  ?CREATININE 0.96 1.06*  ?CALCIUM 9.7 9.9  ? ? ?Lipid Panel:  ?Recent Labs  ?Lab 02/12/22 ?0507  ?TRIG 297*  ? ? ?HgbA1c: No results for input(s): HGBA1C in the last 168 hours. ?Urine Drug Screen: No results for input(s): LABOPIA, COCAINSCRNUR, LABBENZ, AMPHETMU, THCU, LABBARB in the last 168 hours.  ?Alcohol Level No results for input(s): ETH in the last 168 hours. ? ?IMAGING past 24 hours ?No results found. ? ?PHYSICAL EXAM ?Temp:  [98.2 ?F (36.8 ?C)-100 ?F (37.8 ?C)] 100 ?F (37.8 ?C) (03/01 0800) ?Pulse Rate:  [53-94] 81 (03/01 0817) ?Resp:  [15-26] 21 (03/01 0800) ?BP: (103-194)/(51-99) 103/51 (03/01 0909) ?SpO2:  [93 %-100 %] 96 % (03/01 0800) ?FiO2 (%):  [30 %] 30 % (03/01 0726) ?  ?General - Well nourished, well developed, woman with tracheostomy ?  ?Cardiovascular - Regular rhythm and rate on telemetry.  ?  ?Mental Status -  ?Patient is not following commands today, does not respond to name ?  ?Cranial Nerves II - XII - ?II - Pupils pinpoint and sluggish. Will look around but not follow finger or examiner. ?III, IV, VI - Dysconjugate gaze significantly improved. Oculocephalic reflex  intact ? ?Motor Strength - Patient withdraws to pain on R side in both extremities, moves RUE and RLE spontaneously and nonpurposefully.  ? ?Gait and Station - deferred. ?  ?  ?  ?ASSESSMENT/PLAN ?[name] is a 51 y.o. female with history of HTN presenting with AMS, L sided weakness, and slurred speech. Attempted extubation 2/23 and patient had post-extubation stridor leading to re-intubation. Cords non-edematous.  Re-attempted extubation 2/26, and patient failed leading to reintubation.  Tracheostomy placed 2/28, sedation weaned. Add seroquel and start PT/OT.  Trach collar trials daily. ?  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 Intra-axial blood volume slightly greater than 33 mL. Mass effect with 4-5 mm leftward midline shift; superimposed chronic meningocele progressing since 2016.  ?Repeat CTH at 0045 with 36 mL hemorrhagic area, and 0835 repeat unchanged. ?CTA head & neck No large vessel occlusion, intracranial aneurysm, or abnormal vasculature associated with hemorrhage. 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 ?CT repeat 2/20 stable hematoma and MLS ?Follow-up MRI 2/21 with some extension of edema and stable ICH. ?Follow up CT  2/24 and 2/26 with no progression of ICH; 44mm shift on the latter ?2D Echo LVEF 65-70%, small pericardial effusion present; trival MVR ?LDL 64 ?HgbA1c 5.6 ?VTE prophylaxis - heparin subcu ?No antithrombotic prior to admission, now on No antithrombotic.  ?Therapy recommendations:  CIR ?Disposition:  Pending  ?  ?Cerebral edema ?CT head showed midline shift 5 mm;  ?Repeat CT stable hematoma and midline shift ?MRI with some extension of edema and stable ICH, intraventricular extension noted. ?Follow up CT 2/24 with no progression of ICH ?F/U CT 2/26 stable with 68mm shift. ?Saline  75->50->NS->off ?24 hr Na stable 153 ?Na monitoring on daily BMP ?  ?Hypertensive emergency ?SBP as high as 250 on presentation ?Home meds:  Coreg 25 mg BID, HCTZ 25 ?Off of Cleviprex  ?PRN Labetalol 10 mg q2h for SBP >150. Re-initiated as patient has been without bradycardia. ?Continue Norvasc 10 mg and Clonidine 0.1mg  TID, and Coreg 12.5 mg BID ?Hydralazine d/c ?Due to inc Cr, initially d/c'd Losartan ?Cr .96 -> 1.15 -> 1.06 ?BP goal less than 160 ?Long-term BP goal normotensive ?  ?Elevated Cr/ AKI, resolved ?24-hour Cr dec from 1.17-0.96 ?Losartan restarted ?BMP monitoring ?Continue TF ?  ?Elevated Troponin ?Downtrending [phone]; EKG with sinus rhythm, PACs, and nonspecific ST wave abnormality, possible inferiolateral ischemia versus 2/2 BP derangements and intubation. ?  ?Respiratory failure ?Intubated in the ED for airway protection ?Extubated ->re-intubated 2/23 and 2/26.   ?Trached 2/28 ?Fentanyl and versed PRN for agitation and pain ?On trach collar today ?  ?Other Stroke Risk Factors ?Family hx stroke (Maternal Uncle) ?  ?Other Active Problems ?Leukocytosis: WBC downtrending 11.6-> 10.8, UA negative; Tracheal aspirate culture with moderate WBC, gram pos cocci in pairs and clusters (S. Aureus and S. Marcescens) -> on cefepime (renally dosed by pharmacy) ?Pressure ulcer of L hand ?  ?Hospital day # 10 ?  ? Patient seen and examined by NP/APP with MD. MD to update note as needed.  ?  ?Sarah Myers , MSN, AGACNP-BC ?[facility] ?See Amion for schedule and pager information ?02/17/2022 1:32 PM ?  ?  ?ATTENDING ATTESTATION: ?This is a patient with a large basal ganglia intracranial hemorrhage with mass effect.  She was much more alert and awake interactive yesterday.  This morning she required more sedation due to agitation.  She is not as responsive will open eyes but not follow commands.  Still with left hemiplegia.   her losartan and Coreg were increased by CCM yesterday.  She is on max  dose of 4 blood pressure medicine.  Despite that is still elevated added clonidine 0.1 mg twice daily today.  She is weaning well on trach collar.  Continue to monitor. ?  ?Dr. Viviann Spare evaluated pt independently, reviewed imaging, chart, labs. Discussed and formulated plan with the resident. Please see  note above for details.    ?  ?This patient is critically ill due to respiratory distress, ICH and at significant risk of neurological worsening, death form heart failure, respiratory failure, recurrent stroke, bleeding from [facility], seizure, sepsis. This patient's care requires constant monitoring of vital signs, hemodynamics, respiratory and cardiac monitoring, review of multiple databases, neurological assessment, discussion with family, other specialists and medical decision making of high complexity. I spent 35 minutes of neurocritical care time in the care of this patient.  ?Sarah Evans,MD  ? ?To contact Stroke Continuity provider, please refer to [email or web address]. ?After hours, contact General Neurology  ?

## 2022-02-18 ENCOUNTER — Inpatient Hospital Stay (HOSPITAL_COMMUNITY): Payer: 59

## 2022-02-18 DIAGNOSIS — Z930 Tracheostomy status: Secondary | ICD-10-CM

## 2022-02-18 LAB — BASIC METABOLIC PANEL
Anion gap: 5 (ref 5–15)
BUN: 32 mg/dL — ABNORMAL HIGH (ref 6–20)
CO2: 25 mmol/L (ref 22–32)
Calcium: 9.6 mg/dL (ref 8.9–10.3)
Chloride: 113 mmol/L — ABNORMAL HIGH (ref 98–111)
Creatinine, Ser: 0.93 mg/dL (ref 0.44–1.00)
GFR, Estimated: >60 mL/min (ref >60)
Glucose, Bld: 193 mg/dL — ABNORMAL HIGH (ref 70–99)
Potassium: 4.4 mmol/L (ref 3.5–5.1)
Sodium: 143 mmol/L (ref 135–145)

## 2022-02-18 LAB — GLUCOSE, CAPILLARY
Glucose-Capillary: 145 mg/dL — ABNORMAL HIGH (ref 70–99)
Glucose-Capillary: 146 mg/dL — ABNORMAL HIGH (ref 70–99)
Glucose-Capillary: 159 mg/dL — ABNORMAL HIGH (ref 70–99)
Glucose-Capillary: 163 mg/dL — ABNORMAL HIGH (ref 70–99)
Glucose-Capillary: 201 mg/dL — ABNORMAL HIGH (ref 70–99)
Glucose-Capillary: 224 mg/dL — ABNORMAL HIGH (ref 70–99)

## 2022-02-18 LAB — CBC
HCT: 33.2 % — ABNORMAL LOW (ref 36.0–46.0)
Hemoglobin: 10.1 g/dL — ABNORMAL LOW (ref 12.0–15.0)
MCH: 28.1 pg (ref 26.0–34.0)
MCHC: 30.4 g/dL (ref 30.0–36.0)
MCV: 92.2 fL (ref 80.0–100.0)
Platelets: 157 10*3/uL (ref 150–400)
RBC: 3.6 MIL/uL — ABNORMAL LOW (ref 3.87–5.11)
RDW: 14.2 % (ref 11.5–15.5)
WBC: 11.6 10*3/uL — ABNORMAL HIGH (ref 4.0–10.5)
nRBC: 0 % (ref 0.0–0.2)

## 2022-02-18 MED ORDER — DIPHENHYDRAMINE HCL 50 MG/ML IJ SOLN
INTRAMUSCULAR | Status: AC
  Filled 2022-02-18: qty 1

## 2022-02-18 MED ORDER — CLONIDINE HCL 0.1 MG PO TABS
0.1000 mg | ORAL_TABLET | Freq: Three times a day (TID) | ORAL | Status: DC | PRN
  Administered 2022-02-18: 0.1 mg
  Filled 2022-02-18: qty 1

## 2022-02-18 MED ORDER — HYDROCHLOROTHIAZIDE 10 MG/ML ORAL SUSPENSION: 25.0000 mg | Freq: Every day | ORAL | Status: DC

## 2022-02-18 MED ORDER — FUROSEMIDE 10 MG/ML IJ SOLN
40.0000 mg | Freq: Once | INTRAMUSCULAR | Status: AC
  Administered 2022-02-18: 40 mg via INTRAVENOUS
  Filled 2022-02-18: qty 4

## 2022-02-18 MED ORDER — NYSTATIN 100000 UNIT/ML MT SUSP
5.0000 mL | Freq: Four times a day (QID) | OROMUCOSAL | Status: AC
  Administered 2022-02-18 – 2022-02-24 (×18): 500000 [IU] via ORAL
  Filled 2022-02-18 (×21): qty 5

## 2022-02-18 MED ORDER — HYDROCHLOROTHIAZIDE 25 MG PO TABS
25.0000 mg | ORAL_TABLET | Freq: Every day | ORAL | Status: DC
  Administered 2022-02-18 – 2022-02-21 (×4): 25 mg
  Filled 2022-02-18 (×4): qty 1

## 2022-02-18 MED ORDER — SPIRONOLACTONE 25 MG PO TABS
50.0000 mg | ORAL_TABLET | Freq: Every day | ORAL | Status: DC
  Administered 2022-02-18 – 2022-03-07 (×18): 50 mg
  Filled 2022-02-18 (×18): qty 2

## 2022-02-18 MED ORDER — CLONAZEPAM 0.5 MG PO TABS
1.5000 mg | ORAL_TABLET | Freq: Two times a day (BID) | ORAL | Status: DC
  Administered 2022-02-18 – 2022-03-07 (×36): 1.5 mg
  Filled 2022-02-18 (×4): qty 3
  Filled 2022-02-18: qty 1
  Filled 2022-02-18 (×8): qty 3
  Filled 2022-02-18: qty 1
  Filled 2022-02-18: qty 3
  Filled 2022-02-18: qty 1
  Filled 2022-02-18 (×8): qty 3
  Filled 2022-02-18: qty 1
  Filled 2022-02-18: qty 3
  Filled 2022-02-18 (×2): qty 1
  Filled 2022-02-18 (×6): qty 3
  Filled 2022-02-18: qty 1
  Filled 2022-02-18: qty 3

## 2022-02-18 NOTE — Progress Notes (Addendum)
Speech Language Pathology Treatment: Dysphagia;Passy Muir Speaking valve  ?Patient Details ?Name: Sarah Myers ?MRN: [id number] ?DOB: 09/14/1971 ?Today's Date: 02/18/2022 ?Time: 1020-1035 ?SLP Time Calculation (min) (ACUTE ONLY): 15 min ? ?Assessment / Plan / Recommendation ?Clinical Impression ? ST follow up for skilled therapy to address swallowing and PMSV goals.  Unfortunately she was not successful with PMSV placement this date.  Almost immediately after placement patient was noted to sound stridorous.  With removal of PMSV air trapping was evident.  She was also noted to have a strong cough with removal of PMSV with expectoration of thin, red tinged secretions.  Consider direct visualization of her vocal folds to determine etiology of the stridor.  Also suggest possibility of a trach change to at least a cuffless trach when appropriate and if possible a smaller size.   Her swallowing was re assessed using ice chips.  She made no attempts to follow any commands or to communicate.  She was accepting of the ice chip but required max cues to orally manipulate and swallow material.  Swallow trigger was noted and she was not observed to cough.  RN suctioned patient afterwards.  Suggest she remain NPO pending re assessment.  ST will continue to follow.   ?  ?HPI HPI: 51 y/o female came to the [facility] ER on 2/19 with left sided weakness and slurred speech.  Noted to have a right ICH with brain compression present on admission.  CT 2/26: "ICH without rebleeding. Hematoma and edema causes 4 mm of midline shift."  Intubated 2/19. Tracheostomy 2/28 (shiley 6, cuffed). ?  ?   ?SLP Plan ? Continue with current plan of care ? ?  ?  ?Recommendations for follow up therapy are one component of a multi-disciplinary discharge planning process, led by the attending physician.  Recommendations may be updated based on patient status, additional functional criteria and insurance authorization. ?  ? ?Recommendations  ?Diet  recommendations: NPO ?Medication Administration: Via alternative means  ?   ? Patient may use Passy-Muir Speech Valve: with SLP only ?PMSV Supervision: Full ?MD: Please consider changing trach tube to : Cuffless;Smaller size  ?   ? ? ? ? General recommendations: Rehab consult ?Oral Care Recommendations: Oral care QID;Staff/trained caregiver to provide oral care;Oral care prior to ice chip/H20 ?Follow Up Recommendations: Acute inpatient rehab (3hours/day) ?Assistance recommended at discharge: Frequent or constant Supervision/Assistance ?SLP Visit Diagnosis: Dysphagia, oropharyngeal phase (R13.12) ?Plan: Continue with current plan of care ? ? ? ? ?  ?  ? ?Dimas Aguas, MA, CCC-SLP ?Acute Rehab SLP ?[phone] ? ?Fleet Contras ? ?02/18/2022, 10:48 AM ?

## 2022-02-18 NOTE — Progress Notes (Addendum)
STROKE TEAM PROGRESS NOTE  ? ?INTERVAL HISTORY ?Patient is seen in her room with no family at the bedside.  She is on trach collar and has been able to tolerate it for several hours.  She has been hemodynamically stable and her neurological exam is stable.  She has had no acute events overnight. ? ?Vitals:  ? 02/18/22 1130 02/18/22 1200 02/18/22 1230 02/18/22 1234  ?BP: (!) 141/83 (!) 152/92 (!) 151/91 (!) 151/91  ?Pulse: 76 80 71 76  ?Resp: 16 (!) 25 (!) 22 (!) 21  ?Temp:  99.2 ?F (37.3 ?C)    ?TempSrc:  Axillary    ?SpO2: 96% 93% 94% 93%  ?Weight:      ?Height:      ? ?CBC:  ?Recent Labs  ?Lab 02/15/22 ?1245 02/18/22 ?8099  ?WBC 9.8 11.6*  ?HGB 10.0* 10.1*  ?HCT 32.4* 33.2*  ?MCV 92.3 92.2  ?PLT 124* 157  ? ? ?Basic Metabolic Panel:  ?Recent Labs  ?Lab 02/17/22 ?8338 02/18/22 ?2505  ?NA 147* 143  ?K 4.1 4.4  ?CL 112* 113*  ?CO2 26 25  ?GLUCOSE 193* 193*  ?BUN 35* 32*  ?CREATININE 1.06* 0.93  ?CALCIUM 9.9 9.6  ? ? ?Lipid Panel:  ?Recent Labs  ?Lab 02/12/22 ?0507  ?TRIG 297*  ? ? ?HgbA1c: No results for input(s): HGBA1C in the last 168 hours. ?Urine Drug Screen: No results for input(s): LABOPIA, COCAINSCRNUR, LABBENZ, AMPHETMU, THCU, LABBARB in the last 168 hours.  ?Alcohol Level No results for input(s): ETH in the last 168 hours. ? ?IMAGING past 24 hours ?DG CHEST PORT 1 VIEW ? ?Result Date: 02/18/2022 ?CLINICAL DATA:  Unresponsive.  Shortness of breath. EXAM: PORTABLE CHEST 1 VIEW COMPARISON:  02/14/2022 FINDINGS: Right arm PICC line tip is at the cavoatrial junction. Stable cardiomediastinal contours. Right lung clear. Persistent airspace opacification within the left lower lung. Increase hazy opacity within the left upper lobe. IMPRESSION: 1. Persistent airspace opacification within the left lower lung. 2. Increased hazy opacification within the left upper lobe. Electronically Signed   By: Signa Kell M.D.   On: 02/18/2022 08:30   ? ?PHYSICAL EXAM ?Temp:  [98.2 ?F (36.8 ?C)-100 ?F (37.8 ?C)] 100 ?F (37.8 ?C)  (03/01 0800) ?Pulse Rate:  [53-94] 81 (03/01 0817) ?Resp:  [15-26] 21 (03/01 0800) ?BP: (103-194)/(51-99) 103/51 (03/01 0909) ?SpO2:  [93 %-100 %] 96 % (03/01 0800) ?FiO2 (%):  [30 %] 30 % (03/01 0726) ?  ?General - Well nourished, well developed, woman with tracheostomy ?  ?Cardiovascular - Regular rhythm and rate on telemetry.  ?  ?Mental Status -  ?Patient is not following commands today, does not respond to name ?  ?Cranial Nerves II - XII - ?II - Pupils pinpoint and sluggish.  Right gaze deviation can be overcome ?III, IV, VI - Dysconjugate gaze significantly improved. Oculocephalic reflex intact ?XII tongue midline ? ?Motor Strength - Patient withdraws to pain on R side in both extremities, moves RUE and RLE spontaneously and nonpurposefully.  ? ?Gait and Station - deferred. ?  ?  ?  ?ASSESSMENT/PLAN ?[name] is a 51 y.o. female with history of HTN presenting with AMS, L sided weakness, and slurred speech. Attempted extubation 2/23 and patient had post-extubation stridor leading to re-intubation. Cords non-edematous.  Re-attempted extubation 2/26, and patient failed leading to reintubation.  Tracheostomy placed 2/28, sedation weaned. Add seroquel and start PT/OT.  Trach collar trials daily, tolerating well. ?  ?ICH: Acute right large BG ICH likely secondary  to hypertensive emergency ?Code Stroke CT head Positive for Acute Right Lentiform Hemorrhage tracking into the right temporal lobe. Intra-axial blood volume slightly greater than 33 mL. Mass effect with 4-5 mm leftward midline shift; superimposed chronic meningocele progressing since 2016.  ?Repeat CTH at 0045 with 36 mL hemorrhagic area, and 0835 repeat unchanged. ?CTA head & neck No large vessel occlusion, intracranial aneurysm, or abnormal vasculature associated with hemorrhage. 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 ?CT repeat 2/20 stable hematoma and MLS ?Follow-up MRI 2/21 with some  extension of edema and stable ICH. ?Follow up CT 2/24 and 2/26 with no progression of ICH; 66mm shift on the latter ?2D Echo LVEF 65-70%, small pericardial effusion present; trival MVR ?LDL 64 ?HgbA1c 5.6 ?VTE prophylaxis - heparin subcu ?No antithrombotic prior to admission, now on No antithrombotic.  ?Therapy recommendations:  CIR ?Disposition:  Pending  ?  ?Cerebral edema ?CT head showed midline shift 5 mm;  ?Repeat CT stable hematoma and midline shift ?MRI with some extension of edema and stable ICH, intraventricular extension noted. ?Follow up CT 2/24 with no progression of ICH ?F/U CT 2/26 stable with 55mm shift. ?Saline 75->50->NS->off ?24 hr Na stable 153 ?Na monitoring on daily BMP ?  ?Hypertensive emergency ?SBP as high as 250 on presentation ?Home meds:  Coreg 25 mg BID, HCTZ 25 ?Off of Cleviprex  ?PRN Labetalol 10 mg q2h for SBP >150. Re-initiated as patient has been without bradycardia. ?Continue Norvasc 10 mg and Clonidine 0.1mg  TID, and Coreg 12.5 mg BID ?Hydralazine d/c ?Due to inc Cr, initially d/c'd Losartan ?Cr .96 -> 1.15 -> 1.06 -> 0.93 ?BP goal less than 160 ?Long-term BP goal normotensive ?  ?Elevated Cr/ AKI, resolved ?24-hour Cr dec from 1.17-0.96 ?Losartan restarted ?BMP monitoring ?Continue TF ?  ?Elevated Troponin ?Downtrending [id number]; EKG with sinus rhythm, PACs, and nonspecific ST wave abnormality, possible inferiolateral ischemia versus 2/2 BP derangements and intubation. ?  ?Respiratory failure ?Intubated in the ED for airway protection ?Extubated ->re-intubated 2/23 and 2/26.   ?Trached 2/28 ?Fentanyl and versed PRN for agitation and pain ?On trach collar, tolerating well ?  ?Other Stroke Risk Factors ?Family hx stroke (Maternal Uncle) ?  ?Other Active Problems ?Leukocytosis: WBC downtrending 11.6-> 10.8, UA negative; Tracheal aspirate culture with moderate WBC, gram pos cocci in pairs and clusters (S. Aureus and S. Marcescens) -> on cefepime (renally dosed by pharmacy) ?Pressure  ulcer of L hand ?  ?Hospital day # 10 ?  ? Patient seen and examined by NP/APP with MD. MD to update note as needed.  ?  ?Cortney E Ernestina Columbia , MSN, AGACNP-BC ?[facility] ?See Amion for schedule and pager information ?02/18/2022 12:55 PM ? ?ATTENDING NOTE: ?I reviewed above note and agree with the assessment and plan. Pt was seen and examined.  ? ?No family at bedside.  Patient lying in bed, open eyes on voice.  On trach collar tolerating well, not able to answer questions but following all simple commands. Right gaze preference, not crossing midline, pupils 37mm bilaterally, reactive to light. Blinking to visual threat on the right but not on the left. Left facial droop. Tongue midline. LUE and LLE hemiplegia. RUE on restrain but hand grip at least 4/5. RLE at least 3/5 proximal. Sensation and coordination not quite cooperative, gait not tested.  ? ?BP fluctuate, started clonidine yesterday.  Still on multiple BP meds for BP control including Norvasc, Coreg, Cozaar, spironolactone as well as HCTZ.  BP goal less  than 160, long-term BP goal normotensive.  Still on tube feeding and speech on board.  Per RN, patient was able to speak some words with speaking valve.  Appreciate CCM for respiratory management.  PT and OT recommend CIR. ? ?For detailed assessment and plan, please refer to above as I have made changes wherever appropriate.  ? ?Neurology will sign off. Please call with questions. Pt will follow up with stroke clinic NP at [facility] in about 4 weeks after discharge. Thanks for the consult. ? ? ?Marvel Plan, MD PhD ?Stroke Neurology ?02/18/2022 ?6:00 PM ? ? ?  ? ?To contact Stroke Continuity provider, please refer to [email or web address]. ?After hours, contact General Neurology  ?

## 2022-02-18 NOTE — Progress Notes (Signed)
Inpatient Rehab Admissions Coordinator:  ? ?Per therapy patient was screened for CIR candidacy by Megan Salon, MS, CCC-SLP ?Marland Kitchen At this time, Pt. Remains total A for bed mobility and has not attempted OOB. She does not appear able to tolerate intensity of CIR at this time; however,  Pt. may have potential to progress to becoming a potential CIR candidate, so CIR admissions team will follow and monitor for progress and participation with therapies and place consult order if Pt. appears to be an appropriate candidate. Please contact me with any questions.  ? ?Megan Salon, MS, CCC-SLP ?Rehab Admissions Coordinator  ?[phone] (celll) ?[phone] (office) ? ?

## 2022-02-18 NOTE — Progress Notes (Signed)
? ?NAME:  NELIA ROGOFF, MRN:  [id number], DOB:  Sep 07, 1971, LOS: 13 ?ADMISSION DATE:  02/05/2022, CONSULTATION DATE:  2/19 ?REFERRING MD:  Selina Cooley - neuro, CHIEF COMPLAINT:  Left sided weakness, slurred speech  ? ?History of Present Illness:  ?51 y/o female came to the [facility] ER on 2/19 with left sided weakness and slurred speech.  Noted to have a right ICH with brain compression present on admission.   ? ?3% saline, cleviprex, intubated ? ?Pertinent  Medical History  ?Hypertension ? ?Significant Hospital Events: ?Including procedures, antibiotic start and stop dates in addition to other pertinent events   ?2/19 admitted to neuro with acute R ICH. Intubated for airway protection. Started on 3% and  cleviprex. PCCM consult. NSGY consult  ?02/06/2022 For repeat CT Head >> Overnight CT showed increase in size of bleed to 36 ml , but unchanged midline shift at 4 mm . Repeating CT head again 2/20 am. Neurosurgery is aware ?2/21: 3% saline stopped due to Na 158. weaned off cleviprex. Remains intubated. ?2/22: neuro exam much improved (moves RUE and RLE to command; thumbs up; blinks eyes to command; sticks tongue out); sinus pause overnight w/ inferiolateral ST elevation overnight; troponin 387 then 269 ?2/23 failed extubation due to stridor. Reintubated ?2/24: Diuresis ?2/26: failed 2nd attempt at extubation. CT head ICH without rebleeding. Hematoma and edema causes 4 mm of midline shift. 2. Bilateral cribriform plate cephalocele. ?2/28 tracheostomy by Kendrick Fries, stopped versed infusion ?3/1 trach collar x1 hour ?3/2 worked with PT ? ?Micro ?2/19 sars cov 2 / flu > negative ?2/23 resp culture MSSA serratia ? ?Antibiotics ?2/23 cefepime > 3/1 ?2/23 vanc x 1 ? ?Interim History / Subjective:  ? ?BP above goal yesterday, given clonidine x1 ?Off vent most of the day, went back on around 8 o'clock for tachypnea ?Afebrile ? ? ?Objective   ?Blood pressure (!) 159/89, pulse 83, temperature 99.4 ?F (37.4 ?C), temperature source Oral,  resp. rate (!) 28, height 5\' 4"  (1.626 m), weight 72.9 kg, SpO2 97 %. ?   ?Vent Mode: PRVC ?FiO2 (%):  [28 %-30 %] 30 % ?Set Rate:  [20 bmp] 20 bmp ?Vt Set:  [430 mL] 430 mL ?PEEP:  [5 cmH20] 5 cmH20 ?Plateau Pressure:  [18 cmH20-19 cmH20] 18 cmH20  ? ?Intake/Output Summary (Last 24 hours) at 02/18/2022 0802 ?Last data filed at 02/18/2022 0700 ?Gross per 24 hour  ?Intake 1454.97 ml  ?Output 650 ml  ?Net 804.97 ml  ? ?Filed Weights  ? 02/11/22 0500 02/14/22 0500 02/16/22 0329  ?Weight: 75 kg 76.7 kg 72.9 kg  ? ? ?Examination: ? ?General:  In bed on vent ?HENT: NCAT tracheostomy in place ?PULM: CTA B, vent supported breathing ?CV: RRR, no mgr ?GI: BS+, soft, nontender ?MSK: normal bulk and tone ?Neuro: left side hemiplegia, moving right side OK ? ? ?Resolved Hospital Problem list   ?MSSA/Serratia pneumonia, fever on 2/28 due to pneumonia> resolved ?AKI > resolved ?Iatrogenic hypernatremia from hypertonic saline given for brain swelling  ? ?Assessment & Plan:  ?Remains critically ill due to need for mechanical ventilation, encephalopathy and difficult to control hypertension in setting of recent ICH ? ?Right temporal hematoma with brain compression > mentation improving ?Agitation, acute encephalopathy due to ICU delirium > persistent, still requiring prn IV push meds ?Daughter says that she is anxious at baseline but doesn't take medications ?Continue seroquel ?Out of bed ?PT consult ?Lights on during daytime, off at night ?Increase clonazepam 1.5mg  bid ?Continue oxycodone prn ?Avoid versed ? ?  Acute hypoxemic respiratory failure due to acute cardiogenic pulmonary edema > improving ?Stridor due to upper airway edema, failed extubation x 2 ?Tracheostomy on 2/28 ?3/4 Left sided infiltrate without fever, chills, undetermined significance ?Trach care per routine ?Goal is trach collar 24 hours ?Continue pulm toilette per routine ?Lasix x 1 dose 3/4 ? ?Hypertensive emergency> still difficult to control essential  hypertension ?Continue coreg, losartan, amlodipine, spironolactone ?Add HCTZ 25 mgdaily ?Add prn clonidine> would prefer to avoid using this long term but it is very effective for her ? ? ? ?Best Practice (right click and "Reselect all SmartList Selections" daily)  ? ?Diet/type: tubefeeds ?DVT prophylaxis: systemic heparin ?GI prophylaxis: PPI ?Lines: Central line and yes and it is still needed ?Foley:  N/A ?Code Status:  full code ?Last date of multidisciplinary goals of care discussion [Breosha called on 3/2, no answer, left message] ? ?Labs   ?CBC: ?Recent Labs  ?Lab 02/12/22 ?0507 02/12/22 ?1522 02/13/22 ?0451 02/14/22 ?5852 02/15/22 ?7782 02/18/22 ?4235  ?WBC 11.6*  --  11.6* 10.8* 9.8 11.6*  ?HGB 11.3* 11.9* 11.3* 10.5* 10.0* 10.1*  ?HCT 35.4* 35.0* 35.7* 32.9* 32.4* 33.2*  ?MCV 90.8  --  91.8 91.1 92.3 92.2  ?PLT 127*  --  125* 115* 124* 157  ? ? ?Basic Metabolic Panel: ?Recent Labs  ?Lab 02/14/22 ?0438 02/15/22 ?3614 02/16/22 ?4315 02/17/22 ?4008 02/18/22 ?6761  ?NA 153* 149* 146* 147* 143  ?K 3.9 4.0 3.9 4.1 4.4  ?CL 120* 118* 115* 112* 113*  ?CO2 26 25 25 26 25   ?GLUCOSE 143* 184* 213* 193* 193*  ?BUN 38* 43* 34* 35* 32*  ?CREATININE 0.96 1.15* 0.96 1.06* 0.93  ?CALCIUM 9.8 9.4 9.7 9.9 9.6  ? ?GFR: ?Estimated Creatinine Clearance: 71.6 mL/min (by C-G formula based on SCr of 0.93 mg/dL). ?Recent Labs  ?Lab 02/13/22 ?0451 02/14/22 ?0438 02/15/22 ?04/17/22 02/18/22 ?04/20/22  ?WBC 11.6* 10.8* 9.8 11.6*  ? ? ?Liver Function Tests: ?No results for input(s): AST, ALT, ALKPHOS, BILITOT, PROT, ALBUMIN in the last 168 hours. ? ?No results for input(s): LIPASE, AMYLASE in the last 168 hours. ?No results for input(s): AMMONIA in the last 168 hours. ? ?ABG ?   ?Component Value Date/Time  ? PHART 7.359 02/12/2022 1522  ? PCO2ART 53.3 (H) 02/12/2022 1522  ? PO2ART 269 (H) 02/12/2022 1522  ? HCO3 29.8 (H) 02/12/2022 1522  ? TCO2 31 02/12/2022 1522  ? ACIDBASEDEF 6.1 (H) 02/07/2022 0945  ? O2SAT 100 02/12/2022 1522  ?   ? ?Coagulation Profile: ?Recent Labs  ?Lab 02/14/22 ?1101  ?INR 1.1  ? ? ?Cardiac Enzymes: ?No results for input(s): CKTOTAL, CKMB, CKMBINDEX, TROPONINI in the last 168 hours. ? ?HbA1C: ?Hemoglobin A1C  ?Date/Time Value Ref Range Status  ?09/26/2016 11:38 AM 5.3  Final  ? ?Hgb A1c MFr Bld  ?Date/Time Value Ref Range Status  ?02/06/2022 04:32 AM 5.6 4.8 - 5.6 % Final  ?  Comment:  ?  (NOTE) ?Pre diabetes:          5.7%-6.4% ? ?Diabetes:              >6.4% ? ?Glycemic control for   <7.0% ?adults with diabetes ?  ? ? ?CBG: ?Recent Labs  ?Lab 02/17/22 ?1538 02/17/22 ?1933 02/17/22 ?2328 02/18/22 ?04/20/22 02/18/22 ?04/20/22  ?GLUCAP 130* 144* 173* 224* 201*  ? ? ?Critical care time: n/a minutes ?  ? ?8099, MD ?[facility] ?Pager: [phone] ?Cell: [phone] ?After 7:00 pm call Elink  [phone] ? ? ? ? ?

## 2022-02-19 LAB — CBC
HCT: 35.7 % — ABNORMAL LOW (ref 36.0–46.0)
Hemoglobin: 11.1 g/dL — ABNORMAL LOW (ref 12.0–15.0)
MCH: 28.5 pg (ref 26.0–34.0)
MCHC: 31.1 g/dL (ref 30.0–36.0)
MCV: 91.8 fL (ref 80.0–100.0)
Platelets: 170 10*3/uL (ref 150–400)
RBC: 3.89 MIL/uL (ref 3.87–5.11)
RDW: 13.8 % (ref 11.5–15.5)
WBC: 10.1 10*3/uL (ref 4.0–10.5)
nRBC: 0 % (ref 0.0–0.2)

## 2022-02-19 LAB — COMPREHENSIVE METABOLIC PANEL
ALT: 54 U/L — ABNORMAL HIGH (ref 0–44)
AST: 34 U/L (ref 15–41)
Albumin: 2.6 g/dL — ABNORMAL LOW (ref 3.5–5.0)
Alkaline Phosphatase: 54 U/L (ref 38–126)
Anion gap: 9 (ref 5–15)
BUN: 31 mg/dL — ABNORMAL HIGH (ref 6–20)
CO2: 27 mmol/L (ref 22–32)
Calcium: 10.3 mg/dL (ref 8.9–10.3)
Chloride: 105 mmol/L (ref 98–111)
Creatinine, Ser: 0.94 mg/dL (ref 0.44–1.00)
GFR, Estimated: >60 mL/min (ref >60)
Glucose, Bld: 162 mg/dL — ABNORMAL HIGH (ref 70–99)
Potassium: 4.3 mmol/L (ref 3.5–5.1)
Sodium: 141 mmol/L (ref 135–145)
Total Bilirubin: 0.3 mg/dL (ref 0.3–1.2)
Total Protein: 6.8 g/dL (ref 6.5–8.1)

## 2022-02-19 LAB — GLUCOSE, CAPILLARY
Glucose-Capillary: 134 mg/dL — ABNORMAL HIGH (ref 70–99)
Glucose-Capillary: 135 mg/dL — ABNORMAL HIGH (ref 70–99)
Glucose-Capillary: 136 mg/dL — ABNORMAL HIGH (ref 70–99)
Glucose-Capillary: 147 mg/dL — ABNORMAL HIGH (ref 70–99)
Glucose-Capillary: 159 mg/dL — ABNORMAL HIGH (ref 70–99)
Glucose-Capillary: 208 mg/dL — ABNORMAL HIGH (ref 70–99)

## 2022-02-19 MED ORDER — FUROSEMIDE 40 MG PO TABS
40.0000 mg | ORAL_TABLET | Freq: Every day | ORAL | Status: AC
  Administered 2022-02-19 – 2022-02-20 (×2): 40 mg
  Filled 2022-02-19 (×2): qty 1

## 2022-02-19 NOTE — Progress Notes (Signed)
? ?NAME:  Sarah Myers, MRN:  [id number], DOB:  Jul 26, 1971, LOS: 24 ?ADMISSION DATE:  02/05/2022, CONSULTATION DATE:  2/19 ?REFERRING MD:  Quinn Axe - neuro, CHIEF COMPLAINT:  Left sided weakness, slurred speech  ? ?History of Present Illness:  ?51 y/o female came to the [facility] ER on 2/19 with left sided weakness and slurred speech.  Noted to have a right ICH with brain compression present on admission.   ? ?3% saline, cleviprex, intubated ? ?Pertinent  Medical History  ?Hypertension ? ?Significant Hospital Events: ?Including procedures, antibiotic start and stop dates in addition to other pertinent events   ?2/19 admitted to neuro with acute R ICH. Intubated for airway protection. Started on 3% and  cleviprex. PCCM consult. NSGY consult  ?02/06/2022 For repeat CT Head >> Overnight CT showed increase in size of bleed to 36 ml , but unchanged midline shift at 4 mm . Repeating CT head again 2/20 am. Neurosurgery is aware ?2/21: 3% saline stopped due to Na 158. weaned off cleviprex. Remains intubated. ?2/22: neuro exam much improved (moves RUE and RLE to command; thumbs up; blinks eyes to command; sticks tongue out); sinus pause overnight w/ inferiolateral ST elevation overnight; troponin 387 then 269 ?2/23 failed extubation due to stridor. Reintubated ?2/24: Diuresis ?2/26: failed 2nd attempt at extubation. CT head ICH without rebleeding. Hematoma and edema causes 4 mm of midline shift. 2. Bilateral cribriform plate cephalocele. ?2/28 tracheostomy by [place], stopped versed infusion ?3/1 trach collar x1 hour ?3/2 worked with PT ?3/4> 3/5 24  hours off vent ? ?Micro ?2/19 sars cov 2 / flu > negative ?2/23 resp culture MSSA serratia ? ?Antibiotics ?2/23 cefepime > 3/1 ?2/23 vanc x 1 ? ?Interim History / Subjective:  ? ?Following commands ?Remains off vent ?Low grade temp noted ?WBC down ? ? ?Objective   ?Blood pressure 126/71, pulse 68, temperature 99.5 ?F (37.5 ?C), temperature source Axillary, resp. rate 20, height 5\' 4"   (1.626 m), weight 72.9 kg, SpO2 98 %. ?   ?FiO2 (%):  [23 %-28 %] 28 %  ? ?Intake/Output Summary (Last 24 hours) at 02/19/2022 [id number] ?Last data filed at 02/19/2022 0600 ?Gross per 24 hour  ?Intake 983 ml  ?Output 1550 ml  ?Net -567 ml  ? ?Filed Weights  ? 02/11/22 0500 02/14/22 0500 02/16/22 0329  ?Weight: 75 kg 76.7 kg 72.9 kg  ? ? ?Examination: ? ?General:  In bed on vent ?HENT: NCAT tracheostomy in place ?PULM: CTA B, vent supported breathing ?CV: RRR, no mgr ?GI: BS+, soft, nontender ?MSK: normal bulk and tone ?Neuro: drowsy for me, not interactive ? ? ? ?Resolved Hospital Problem list   ?MSSA/Serratia pneumonia, fever on 2/28 due to pneumonia> resolved ?AKI > resolved ?Iatrogenic hypernatremia from hypertonic saline given for brain swelling  ? ?Assessment & Plan:  ?Remains critically ill due to need for mechanical ventilation, encephalopathy and difficult to control hypertension in setting of recent ICH ? ?Right temporal hematoma with brain compression > mentation improving ?Agitation, acute encephalopathy due to ICU delirium > improved 3/5 ?Daughter says that she is anxious at baseline but doesn't take medications ?Continue clonazepam 1.5mg  bid for now ?Continue seroquel ?PT consult ?Lights on during daytime, off at night ?Continue oxycodone prn ? ?Acute hypoxemic respiratory failure due to acute cardiogenic pulmonary edema > improving ?Stridor due to upper airway edema, failed extubation x 2 ?Tracheostomy on 2/28 ?3/4 Left sided infiltrate without fever, chills, undetermined significance ?Trach care per routine ?Goal is trach collar 24 hours ?Continue  pulmonary toilette per routine ?Lasix daily x 2 more days then re-assess volume status ? ?Hypertensive emergency> difficult to control essential hypertension ?Continue coreg, losartan, amlodipine, spironolactone, hctz ?Prn clonidine ? ?Transfer to floor, TRH service ?Needs SNF ? ?Best Practice (right click and "Reselect all SmartList Selections" daily)  ? ?Diet/type:  tubefeeds ?DVT prophylaxis: systemic heparin ?GI prophylaxis: PPI ?Lines: Central line and yes and it is still needed ?Foley:  N/A ?Code Status:  full code ?Last date of multidisciplinary goals of care discussion [name] updated bedside 3/3] ? ?Labs   ?CBC: ?Recent Labs  ?Lab 02/13/22 ?0451 02/14/22 ?[id number] 02/15/22 ?[id number] 02/18/22 ?[id number] 02/19/22 ?[id number]  ?WBC 11.6* 10.8* 9.8 11.6* 10.1  ?HGB 11.3* 10.5* 10.0* 10.1* 11.1*  ?HCT 35.7* 32.9* 32.4* 33.2* 35.7*  ?MCV 91.8 91.1 92.3 92.2 91.8  ?PLT 125* 115* 124* 157 170  ? ? ?Basic Metabolic Panel: ?Recent Labs  ?Lab 02/15/22 ?[id number] 02/16/22 ?[id number] 02/17/22 ?[id number] 02/18/22 ?[id number] 02/19/22 ?[id number]  ?NA 149* 146* 147* 143 141  ?K 4.0 3.9 4.1 4.4 4.3  ?CL 118* 115* 112* 113* 105  ?CO2 25 25 26 25 27   ?GLUCOSE 184* 213* 193* 193* 162*  ?BUN 43* 34* 35* 32* 31*  ?CREATININE 1.15* 0.96 1.06* 0.93 0.94  ?CALCIUM 9.4 9.7 9.9 9.6 10.3  ? ?GFR: ?Estimated Creatinine Clearance: 70.9 mL/min (by C-G formula based on SCr of 0.94 mg/dL). ?Recent Labs  ?Lab 02/14/22 ?0438 02/15/22 ?[id number] 02/18/22 ?[id number] 02/19/22 ?[id number]  ?WBC 10.8* 9.8 11.6* 10.1  ? ? ?Liver Function Tests: ?Recent Labs  ?Lab 02/19/22 ?[id number]  ?AST 34  ?ALT 54*  ?ALKPHOS 54  ?BILITOT 0.3  ?PROT 6.8  ?ALBUMIN 2.6*  ? ? ?No results for input(s): LIPASE, AMYLASE in the last 168 hours. ?No results for input(s): AMMONIA in the last 168 hours. ? ?ABG ?   ?Component Value Date/Time  ? PHART 7.359 02/12/2022 1522  ? PCO2ART 53.3 (H) 02/12/2022 1522  ? PO2ART 269 (H) 02/12/2022 1522  ? HCO3 29.8 (H) 02/12/2022 1522  ? TCO2 31 02/12/2022 1522  ? ACIDBASEDEF 6.1 (H) 02/07/2022 0945  ? O2SAT 100 02/12/2022 1522  ?  ? ?Coagulation Profile: ?Recent Labs  ?Lab 02/14/22 ?1101  ?INR 1.1  ? ? ?Cardiac Enzymes: ?No results for input(s): CKTOTAL, CKMB, CKMBINDEX, TROPONINI in the last 168 hours. ? ?HbA1C: ?Hemoglobin A1C  ?Date/Time Value Ref Range Status  ?09/26/2016 11:38 AM 5.3  Final  ? ?Hgb A1c MFr Bld  ?Date/Time Value Ref Range Status  ?02/06/2022  04:32 AM 5.6 4.8 - 5.6 % Final  ?  Comment:  ?  (NOTE) ?Pre diabetes:          5.7%-6.4% ? ?Diabetes:              >6.4% ? ?Glycemic control for   <7.0% ?adults with diabetes ?  ? ? ?CBG: ?Recent Labs  ?Lab 02/18/22 ?1143 02/18/22 ?1543 02/18/22 ?1956 02/18/22 ?2332 02/19/22 ?0342  ?GLUCAP 163* 145* 146* 159* 208*  ? ? ?Critical care time: n/a minutes ?  ? ?Roselie Awkward, MD ?[facility] ?Pager: [phone] ?Cell: [phone] ?After 7:00 pm call Elink  [phone] ? ? ? ? ?

## 2022-02-20 ENCOUNTER — Inpatient Hospital Stay (HOSPITAL_COMMUNITY): Payer: 59

## 2022-02-20 LAB — GLUCOSE, CAPILLARY
Glucose-Capillary: 170 mg/dL — ABNORMAL HIGH (ref 70–99)
Glucose-Capillary: 170 mg/dL — ABNORMAL HIGH (ref 70–99)
Glucose-Capillary: 170 mg/dL — ABNORMAL HIGH (ref 70–99)
Glucose-Capillary: 173 mg/dL — ABNORMAL HIGH (ref 70–99)
Glucose-Capillary: 205 mg/dL — ABNORMAL HIGH (ref 70–99)
Glucose-Capillary: 218 mg/dL — ABNORMAL HIGH (ref 70–99)

## 2022-02-20 LAB — BASIC METABOLIC PANEL
Anion gap: 7 (ref 5–15)
BUN: 30 mg/dL — ABNORMAL HIGH (ref 6–20)
CO2: 28 mmol/L (ref 22–32)
Calcium: 9.9 mg/dL (ref 8.9–10.3)
Chloride: 100 mmol/L (ref 98–111)
Creatinine, Ser: 0.94 mg/dL (ref 0.44–1.00)
GFR, Estimated: >60 mL/min (ref >60)
Glucose, Bld: 215 mg/dL — ABNORMAL HIGH (ref 70–99)
Potassium: 4.4 mmol/L (ref 3.5–5.1)
Sodium: 135 mmol/L (ref 135–145)

## 2022-02-20 LAB — CBC
HCT: 34 % — ABNORMAL LOW (ref 36.0–46.0)
Hemoglobin: 11.2 g/dL — ABNORMAL LOW (ref 12.0–15.0)
MCH: 29.6 pg (ref 26.0–34.0)
MCHC: 32.9 g/dL (ref 30.0–36.0)
MCV: 89.9 fL (ref 80.0–100.0)
Platelets: 164 10*3/uL (ref 150–400)
RBC: 3.78 MIL/uL — ABNORMAL LOW (ref 3.87–5.11)
RDW: 13.6 % (ref 11.5–15.5)
WBC: 9.2 10*3/uL (ref 4.0–10.5)
nRBC: 0 % (ref 0.0–0.2)

## 2022-02-20 MED ORDER — CLONIDINE HCL 0.1 MG PO TABS
0.1000 mg | ORAL_TABLET | Freq: Three times a day (TID) | ORAL | Status: DC | PRN
  Administered 2022-03-21: 0.1 mg via ORAL
  Filled 2022-02-20 (×2): qty 1

## 2022-02-20 MED ORDER — JEVITY 1.5 CAL/FIBER PO LIQD
1000.0000 mL | ORAL | Status: DC
Start: 2022-02-20 — End: 2022-03-20
  Administered 2022-02-20: 1500 mL
  Administered 2022-02-21 – 2022-03-19 (×21): 1000 mL
  Filled 2022-02-20 (×38): qty 1000

## 2022-02-20 NOTE — Progress Notes (Signed)
Nutrition Follow-up ? ?DOCUMENTATION CODES:  ? ?Not applicable ? ?INTERVENTION:  ? ?Tube feeding via cortrak: ?Transition from Osmolite 1.5 to Jevity 1.5 @ 55 ml/h (1320 ml per day) ?ProSource TF to 45 ml BID ?  ?Provides 2060 kcal, 105 gm protein, 1006 ml free water daily ?  ?MVI daily per tube ? ? ?NUTRITION DIAGNOSIS:  ? ?Inadequate oral intake related to inability to eat as evidenced by NPO status. ?Ongoing.  ? ?GOAL:  ? ?Patient will meet greater than or equal to 90% of their needs ?Met with TF at goal.  ? ?MONITOR:  ? ?TF tolerance, I & O's ? ?REASON FOR ASSESSMENT:  ? ?Consult ?Enteral/tube feeding initiation and management ? ?ASSESSMENT:  ? ?Pt with PMH of HTN admitted with acute R ICH  and midline shift. 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 ? ?Pt discussed during ICU rounds and with RN.  ?Pt on trach collar >24 hrs.  ?Working with SLP on Passy-Muir Speech Valve. Plan to tx to the floor.  ?  ?2/24- cortrak placed (gastric)  ?2/28- s/p trach  ? ? ?Medications reviewed and include: colace, SSI, MVI with minerals, protonix, miralax, senokot-s ? ?Labs reviewed: ?CBG's: 134-205 ? ?Diet Order:   ?Diet Order   ? ?       ?  Diet NPO time specified  Diet effective now       ?  ? ?  ?  ? ?  ? ? ?EDUCATION NEEDS:  ? ?Not appropriate for education at this time ? ?Skin:  Reviewed  ? ?Last BM:  100 ml via rectal tube ? ?Height:  ? ?Ht Readings from Last 1 Encounters:  ?02/05/22 '5\' 4"'  (1.626 m)  ? ? ?Weight:  ? ?Wt Readings from Last 1 Encounters:  ?02/16/22 72.9 kg  ? ? ?BMI:  Body mass index is 27.59 kg/m?. ? ?Estimated Nutritional Needs:  ? ?Kcal:  1900-2100 ? ?Protein:  105-120 grams ? ?Fluid:  > 1.9 L ? ?Lockie Pares., RD, LDN, CNSC ?See AMiON for contact information  ? ?

## 2022-02-20 NOTE — Progress Notes (Signed)
Inpatient Rehab Admissions Coordinator:  ? ?Per therapy notes, patient was screened for CIR candidacy by Megan Salon, MS, CCC-SLP. At this time, Pt. is not yet tolerating OOB and I do not think he is able to tolerate the intensity of CIR at this time  Pt. may have potential to progress to becoming a potential CIR candidate, so CIR admissions team will follow and monitor for progress and participation with therapies and place consult order if Pt. appears to be an appropriate candidate. Please contact me with any questions.  ? ?Megan Salon, MS, CCC-SLP ?Rehab Admissions Coordinator  ?[phone] (celll) ?[phone] (office) ? ? ?

## 2022-02-20 NOTE — Progress Notes (Signed)
? ?PROGRESS NOTE ? ?Sarah Myers ERD:[id number] DOB: 08-16-1971 DOA: 02/05/2022 ?PCP: [name], MD ? ?HPI/Recap of past 24 hours: ?51 y/o female came to the [facility] ER on 2/19 with left sided weakness and slurred speech. Noted to have a right ICH with brain compression present on admission. Patient was admitted to neuro ICU, received 3% saline, cleviprex, and eventually intubated on 2/19.  Patient failed extubation x2, and subsequently had a tracheostomy on 2/28, trach collar placed on 3/1, was eventually taken off vent on 3/4.  Patient following commands.  [facility] hospitalist assumed care on 02/20/2022. ? ? ? ?Today, patient unable to vocalize but nods her head to yes and shakes her head to no.  Able to follow commands.  Met speech therapist at bedside, still unable to vocalize with PMSV. ? ? ?Assessment/Plan: ?Principal Problem: ?  ICH (intracerebral hemorrhage) ([place]) ?Active Problems: ?  Malignant hypertension ?  Cerebral edema (HCC) ?  Brain compression ([place]) ?  Pressure injury of skin ?  Acute respiratory failure ([place]) ?  Oliguria ?  AKI (acute kidney injury) ([place]) ? ? ?Right temporal hematoma with brain compression ?Mentation improving- able to follow commands, nods to yes, shakes her head to no ?Continue clonazepam 1.80m bid for now ?Continue seroquel ?PT/OT/SLP consult ?Continue oxycodone prn ?Neurology signed off ?  ?Acute hypoxemic respiratory failure due to acute pulmonary edema ?Stridor due to upper airway edema, failed extubation x 2 ?Tracheostomy on 2/28 ?On tube feeds (covering with insulin) ?Continue trach collar ?Continue pulmonary toilette per routine ?S/P Lasix daily ?SLP to attempt speech with PMSV ? ?Hypertensive emergency> difficult to control essential hypertension ?Continue coreg, losartan, amlodipine, spironolactone, hctz, continue clonidine prn ? ?Fever ?Noted spikes of temp, 100.8 ?CXR, blood culture, tracheal aspirate pending ?Of note, patient grew Serratia and MSSA from previous  tracheal aspirate taken on 2/23, patient completed a course of IV cefepime (low threshold to restart antibiotics again) ?Monitor closely ? ? ? ? ? ?Malnutrition Type: ? ?Nutrition Problem: Inadequate oral intake ?Etiology: inability to eat ? ? ?Malnutrition Characteristics: ? ?Signs/Symptoms: NPO status ? ? ?Nutrition Interventions: ? ?Interventions: Tube feeding, Prostat ?  ? ?Estimated body mass index is 27.59 kg/m? as calculated from the following: ?  Height as of this encounter: '5\' 4"'  (1.626 m). ?  Weight as of this encounter: 72.9 kg.  ?  ? ?Code Status: Full ? ?Family Communication: None at bedside ? ?Disposition Plan: Status is: Inpatient ?Remains inpatient appropriate because: Level of care ? ? ? ? ? ?Consultants: ?Neurology ?PCCM ? ?Procedures: ?Mechanical ventilation ? ?Antimicrobials: ?None ? ?DVT prophylaxis: ?Heparin [place] ? ? ?Objective: ?Vitals:  ? 02/20/22 0805 02/20/22 0834 02/20/22 1136 02/20/22 1146  ?BP: (!) 178/98  (!) 178/95   ?Pulse: 81 77 82   ?Resp:  (!) 30 (!) 25   ?Temp:    (!) 100.8 ?F (38.2 ?C)  ?TempSrc:    Axillary  ?SpO2:  97% 96%   ?Weight:      ?Height:      ? ? ?Intake/Output Summary (Last 24 hours) at 02/20/2022 1426 ?Last data filed at 02/20/2022 0800 ?Gross per 24 hour  ?Intake 880 ml  ?Output 2950 ml  ?Net -2070 ml  ? ?Filed Weights  ? 02/11/22 0500 02/14/22 0500 02/16/22 0329  ?Weight: 75 kg 76.7 kg 72.9 kg  ? ? ?Exam: ?General: NAD, able to follow commands, trach collar  ?Cardiovascular: S1, S2 present ?Respiratory: Coarse breath sounds bilaterally ?Abdomen: Soft, nontender, nondistended, bowel sounds present ?Musculoskeletal:  No bilateral pedal edema noted ?Skin: Normal ?Psychiatry: Unable to assess ? ? ? ?Data Reviewed: ?CBC: ?Recent Labs  ?Lab 02/14/22 ?0438 02/15/22 ?3888 02/18/22 ?2800 02/19/22 ?3491 02/20/22 ?7915  ?WBC 10.8* 9.8 11.6* 10.1 9.2  ?HGB 10.5* 10.0* 10.1* 11.1* 11.2*  ?HCT 32.9* 32.4* 33.2* 35.7* 34.0*  ?MCV 91.1 92.3 92.2 91.8 89.9  ?PLT 115* 124* 157 170 164   ? ?Basic Metabolic Panel: ?Recent Labs  ?Lab 02/16/22 ?0569 02/17/22 ?7948 02/18/22 ?0165 02/19/22 ?5374 02/20/22 ?8270  ?NA 146* 147* 143 141 135  ?K 3.9 4.1 4.4 4.3 4.4  ?CL 115* 112* 113* 105 100  ?CO2 '25 26 25 27 28  ' ?GLUCOSE 213* 193* 193* 162* 215*  ?BUN 34* 35* 32* 31* 30*  ?CREATININE 0.96 1.06* 0.93 0.94 0.94  ?CALCIUM 9.7 9.9 9.6 10.3 9.9  ? ?GFR: ?Estimated Creatinine Clearance: 70.9 mL/min (by C-G formula based on SCr of 0.94 mg/dL). ?Liver Function Tests: ?Recent Labs  ?Lab 02/19/22 ?7867  ?AST 34  ?ALT 54*  ?ALKPHOS 54  ?BILITOT 0.3  ?PROT 6.8  ?ALBUMIN 2.6*  ? ?No results for input(s): LIPASE, AMYLASE in the last 168 hours. ?No results for input(s): AMMONIA in the last 168 hours. ?Coagulation Profile: ?Recent Labs  ?Lab 02/14/22 ?1101  ?INR 1.1  ? ?Cardiac Enzymes: ?No results for input(s): CKTOTAL, CKMB, CKMBINDEX, TROPONINI in the last 168 hours. ?BNP (last 3 results) ?No results for input(s): PROBNP in the last 8760 hours. ?HbA1C: ?No results for input(s): HGBA1C in the last 72 hours. ?CBG: ?Recent Labs  ?Lab 02/19/22 ?1945 02/19/22 ?2348 02/20/22 ?5449 02/20/22 ?2010 02/20/22 ?1140  ?GLUCAP 159* 135* 173* 170* 205*  ? ?Lipid Profile: ?No results for input(s): CHOL, HDL, LDLCALC, TRIG, CHOLHDL, LDLDIRECT in the last 72 hours. ?Thyroid Function Tests: ?No results for input(s): TSH, T4TOTAL, FREET4, T3FREE, THYROIDAB in the last 72 hours. ?Anemia Panel: ?No results for input(s): VITAMINB12, FOLATE, FERRITIN, TIBC, IRON, RETICCTPCT in the last 72 hours. ?Urine analysis: ?   ?Component Value Date/Time  ? COLORURINE YELLOW 02/05/2022 1604  ? APPEARANCEUR CLEAR 02/05/2022 1604  ? LABSPEC <1.005 (L) 02/05/2022 1604  ? PHURINE 6.0 02/05/2022 1604  ? GLUCOSEU NEGATIVE 02/05/2022 1604  ? [place] NEGATIVE 02/05/2022 1604  ?  NEGATIVE 02/05/2022 1604  ? [place] NEGATIVE 02/05/2022 1604  ? PROTEINUR 100 (A) 02/05/2022 1604  ? UROBILINOGEN 1.0 09/20/2015 1448  ? NITRITE NEGATIVE 02/05/2022 1604  ?  LEUKOCYTESUR NEGATIVE 02/05/2022 1604  ? ?Sepsis Labs: ?'@LABRCNTIP' (procalcitonin:4,lacticidven:4) ? ?)No results found for this or any previous visit (from the past 240 hour(s)).  ? ? ?Studies: ?No results found. ? ?Scheduled Meds: ? amLODipine  10 mg Per Tube Daily  ? carvedilol  25 mg Per Tube BID WC  ? chlorhexidine gluconate (MEDLINE KIT)  15 mL Mouth Rinse BID  ? Chlorhexidine Gluconate Cloth  6 each Topical Q0600  ? clonazePAM  1.5 mg Per Tube BID  ? docusate  100 mg Per Tube BID  ? feeding supplement (PROSource TF)  45 mL Per Tube BID  ? guaiFENesin  200 mg Per Tube Q6H  ? heparin injection (subcutaneous)  5,000 Units Subcutaneous Q8H  ? hydrochlorothiazide  25 mg Per Tube Daily  ? insulin aspart  0-15 Units Subcutaneous Q4H  ? losartan  100 mg Per Tube Daily  ? mouth rinse  15 mL Mouth Rinse 10 times per day  ? multivitamin with minerals  1 tablet Per Tube Daily  ? nystatin  5 mL Oral QID  ?  pantoprazole sodium  40 mg Per Tube Daily  ? polyethylene glycol  17 g Per Tube Daily  ? QUEtiapine  50 mg Per Tube QHS  ? senna-docusate  1 tablet Per Tube BID  ? sodium chloride flush  10-40 mL Intracatheter Q12H  ? spironolactone  50 mg Per Tube Daily  ? ? ?Continuous Infusions: ? feeding supplement (JEVITY 1.5 CAL/FIBER)    ? feeding supplement (OSMOLITE 1.5 CAL) 1,000 mL (02/20/22 0500)  ? ? ? LOS: 15 days  ? ? ? ?Alma Friendly, MD ?[facility] ? ?If 7PM-7AM, please contact night-coverage ?[email or web address] ?02/20/2022, 2:26 PM  ?  ?

## 2022-02-20 NOTE — Progress Notes (Signed)
Speech Language Pathology Treatment: Sarah Myers Speaking valve  ?Patient Details ?Name: Sarah Myers ?MRN: [id number] ?DOB: May 05, 1971 ?Today's Date: 02/20/2022 ?Time: 0350-0938 ?SLP Time Calculation (min) (ACUTE ONLY): 10 min ? ?Assessment / Plan / Recommendation ?Clinical Impression ? Pt seen for use of speaking valve and continues with #6 cuffed trach. Suspect the deflated cough may be contributing to her back pressure consistently during sessions. After 10-15 seconds the valve is either blown off d/t trapped air or therapist removes after signs of restlessness. She followed most simple one step commands throughout session. When asked to attempt to speak she was able to produce one 2 second vocalization with no other attempts. Her HR 85, SpO2 95% and RR 22-34. Blood tinged secretions coughed to level of trach hub.  ?Therapist placed a secure chat to CCM for possibility of a cuffless trach to facilitate clearance of air from upper airway.  ?  ?HPI HPI: 51 y/o female came to the [facility] ER on 2/19 with left sided weakness and slurred speech.  Noted to have a right ICH with brain compression present on admission.  CT 2/26: "ICH without rebleeding. Hematoma and edema causes 4 mm of midline shift."  Intubated 2/19. Tracheostomy 2/28 (shiley 6, cuffed). ?  ?   ?SLP Plan ? Continue with current plan of care ? ?  ?  ?Recommendations for follow up therapy are one component of a multi-disciplinary discharge planning process, led by the attending physician.  Recommendations may be updated based on patient status, additional functional criteria and insurance authorization. ?  ? ?Recommendations  ?   ?   ? Patient may use Passy-Muir Speech Valve: with SLP only ?PMSV Supervision: Full ?MD: Please consider changing trach tube to : Cuffless;Smaller size  ?   ? ? ? ? Oral Care Recommendations: Oral care QID ?Follow Up Recommendations:  (TBD) ?Assistance recommended at discharge: Frequent or constant Supervision/Assistance ?SLP  Visit Diagnosis: Aphonia (R49.1) ?Plan: Continue with current plan of care ? ? ? ? ?  ?  ? ? ?Royce Macadamia ? ?02/20/2022, 11:02 AM ?

## 2022-02-20 NOTE — Progress Notes (Signed)
Physical Therapy Treatment ?Patient Details ?Name: Sarah Myers ?MRN: [id number] ?DOB: 1971/04/25 ?Today's Date: 02/20/2022 ? ? ?History of Present Illness 51 y.o. female presents to [facility] hospital on 02/05/2022 with L hemiplegia and dysarthria. 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 Pt intubated 2/19. Attempted extubation 2/23 and 2/26, requiring reintubation same date. Tracheostomy 02/14/2022. PMH includes uncontrolled HTN ? ?  ?PT Comments  ? ? Pt did not respond to commands or questions throughout the session. She was max assist for bed mobility and would inconsistently move her LE during exercise in the bed. During treatment session patient showed deficits in strength, endurance, activity tolerance, and alertness. Changing recommendation for therapy services to skilled nursing facility to address the previously stated deficits. Will continue to follow acutely to maximize functional mobility, independence, and safety. ?   ?Recommendations for follow up therapy are one component of a multi-disciplinary discharge planning process, led by the attending physician.  Recommendations may be updated based on patient status, additional functional criteria and insurance authorization. ? ?Follow Up Recommendations ? Skilled nursing-short term rehab (<3 hours/day) ?  ?  ?Assistance Recommended at Discharge Frequent or constant Supervision/Assistance  ?Patient can return home with the following Two people to help with walking and/or transfers;Two people to help with bathing/dressing/bathroom;Assistance with cooking/housework;Assistance with feeding;Direct supervision/assist for financial management;Direct supervision/assist for medications management;Help with stairs or ramp for entrance;Assist for transportation ?  ?Equipment Recommendations ? Wheelchair (measurements PT);Wheelchair cushion (measurements PT);Hospital bed;Other (comment)  ?  ?Recommendations for Other Services   ? ? ?   ?Precautions / Restrictions Precautions ?Precautions: Fall ?Restrictions ?Weight Bearing Restrictions: No  ?  ? ?Mobility ? Bed Mobility ?Overal bed mobility: Needs Assistance ?Bed Mobility: Supine to Sit, Sit to Supine, Rolling ?Rolling: +2 for physical assistance, Total assist ?  ?Supine to sit: +2 for physical assistance, Total assist ?Sit to supine: +2 for physical assistance, Total assist ?  ?General bed mobility comments: no initiation or response to cues. ?  ? ?Transfers ?  ?  ?  ?  ?  ?  ?  ?  ?  ?  ?  ? ?Ambulation/Gait ?  ?  ?  ?  ?  ?  ?  ?  ? ? ?Stairs ?  ?  ?  ?  ?  ? ? ?Wheelchair Mobility ?  ? ?Modified Rankin (Stroke Patients Only) ?  ? ? ?  ?Balance Overall balance assessment: Needs assistance ?Sitting-balance support: Feet supported, Bilateral upper extremity supported ?Sitting balance-Leahy Scale: Zero ?Sitting balance - Comments: Pt was unable to sit up right without total assist. She did not exhibit any righting reaction. Her neck was flexed and her shoulders where rounded and required total assist to correct. ?Postural control: Left lateral lean, Posterior lean ?  ?  ?  ?  ?  ?  ?  ?  ?  ?  ?  ?  ?  ?  ?  ? ?  ?Cognition Arousal/Alertness: Lethargic ?Behavior During Therapy: Flat affect ?Overall Cognitive Status: Impaired/Different from baseline ?Area of Impairment: Attention, Following commands, Safety/judgement ?  ?  ?  ?  ?  ?  ?  ?  ?  ?Current Attention Level: Focused ?  ?Following Commands:  (Pt does not follow commans.) ?Safety/Judgement: Decreased awareness of safety ?  ?Problem Solving: Slow processing, Requires verbal cues, Requires tactile cues ?General Comments: When asked if she wanted to sit up she clearly shook her head no, then was  largely unresponsive to commands. ?  ?  ? ?  ?Exercises Other Exercises ?Other Exercises: Trunk rotation in supine total assist ?Other Exercises: pushing weight through legs in supine, hooklying position. ? ?  ?General Comments   ?  ?  ? ?Pertinent  Vitals/Pain Pain Assessment ?Faces Pain Scale: No hurt  ? ? ?Home Living   ?  ?  ?  ?  ?  ?  ?  ?  ?  ?   ?  ?Prior Function    ?  ?  ?   ? ?PT Goals (current goals can now be found in the care plan section)   ? ?  ?Frequency ? ? ? Min 3X/week ? ? ? ?  ?PT Plan Discharge plan needs to be updated  ? ? ?Co-evaluation PT/OT/SLP Co-Evaluation/Treatment: Yes ?Reason for Co-Treatment: For patient/therapist safety;Complexity of the patient's impairments (multi-system involvement);To address functional/ADL transfers ?PT goals addressed during session: Mobility/safety with mobility;Balance ?OT goals addressed during session: Strengthening/ROM ?  ? ?  ?AM-PAC PT "6 Clicks" Mobility   ?Outcome Measure ? Help needed turning from your back to your side while in a flat bed without using bedrails?: Total ?Help needed moving from lying on your back to sitting on the side of a flat bed without using bedrails?: Total ?Help needed moving to and from a bed to a chair (including a wheelchair)?: Total ?Help needed standing up from a chair using your arms (e.g., wheelchair or bedside chair)?: Total ?Help needed to walk in hospital room?: Total ?Help needed climbing 3-5 steps with a railing? : Total ?6 Click Score: 6 ? ?  ?End of Session   ?Activity Tolerance: Patient tolerated treatment well ?Patient left: in bed;with call bell/phone within reach;with bed alarm set;with restraints reapplied ?Nurse Communication: Mobility status ?PT Visit Diagnosis: Other abnormalities of gait and mobility (R26.89);Hemiplegia and hemiparesis ?Hemiplegia - Right/Left: Left ?Hemiplegia - dominant/non-dominant: Dominant ?Hemiplegia - caused by: Nontraumatic intracerebral hemorrhage ?  ? ? ?Time: [id number] ?PT Time Calculation (min) (ACUTE ONLY): 41 min ? ?Charges:  $Therapeutic Activity: 23-37 mins          ?          ? ?Quenton Fetter, SPT ? ? ? ?Quenton Fetter ?02/20/2022, 4:45 PM ? ?

## 2022-02-20 NOTE — Progress Notes (Signed)
Occupational Therapy Treatment ?Patient Details ?Name: Sarah Myers ?MRN: [id number] ?DOB: [birth year] ?Today's Date: 02/20/2022 ? ? ?History of present illness 51 y.o. female presents to [facility] hospital on 02/05/2022 with L hemiplegia and dysarthria. 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 Pt intubated 2/19. Attempted extubation 2/23 and 2/26, requiring reintubation same date. Tracheostomy 02/14/2022. PMH includes uncontrolled HTN ?  ?OT comments ? Continued working on cognition, bed mobility and sitting balance seated at EOB. BP monitored throughout and elevated, RN provided IV medication during session. Pt with lethargy. Requires +2 total assist for bed level mobility. Zero sitting balance or righting reactions. Pt not following commands this date, but did shake her head "no" when asked if she wanted to sit up with therapies. Updated d/c recommendation to SNF.   ? ?Recommendations for follow up therapy are one component of a multi-disciplinary discharge planning process, led by the attending physician.  Recommendations may be updated based on patient status, additional functional criteria and insurance authorization. ?   ?Follow Up Recommendations ? Skilled nursing-short term rehab (<3 hours/day)  ?  ?Assistance Recommended at Discharge Frequent or constant Supervision/Assistance  ?Patient can return home with the following ? Two people to help with walking and/or transfers;Two people to help with bathing/dressing/bathroom ?  ?Equipment Recommendations ? Hospital bed;Wheelchair cushion (measurements OT);Wheelchair (measurements OT) (hoyer lift)  ?  ?Recommendations for Other Services   ? ?  ?Precautions / Restrictions Precautions ?Precautions: Fall ?Restrictions ?Weight Bearing Restrictions: No  ? ? ?  ? ?Mobility Bed Mobility ?Overal bed mobility: Needs Assistance ?Bed Mobility: Supine to Sit, Sit to Supine, Rolling ?Rolling: +2 for physical assistance, Total assist ?  ?Supine to  sit: +2 for physical assistance, Total assist ?Sit to supine: +2 for physical assistance, Total assist ?  ?General bed mobility comments: assist for all aspects, no initiation, positioned head a neutral with towel roll when returned to supine ?  ? ?Transfers ?  ?  ?  ?  ?  ?  ?  ?  ?  ?  ?  ?  ?Balance Overall balance assessment: Needs assistance ?Sitting-balance support: Bilateral upper extremity supported, Feet supported ?Sitting balance-Leahy Scale: Zero ?Sitting balance - Comments: no postural righting reactions, neck flexion with rounded shoulders, worked on facilitation anterior pelvic tilt, spinal extention, shoulder retraction ?Postural control: Left lateral lean, Posterior lean ?  ?  ?  ?  ?  ?  ?  ?  ?  ?  ?  ?  ?  ?  ?   ? ?ADL either performed or assessed with clinical judgement  ? ?ADL   ?  ?  ?  ?  ?  ?  ?  ?  ?  ?  ?  ?  ?  ?  ?  ?  ?  ?  ?  ?General ADL Comments: total assist, seemingly unaware of secretions from mouth ?  ? ?Extremity/Trunk Assessment   ?  ?  ?  ?  ?  ? ?Vision   ?Additional Comments: pt primarily keeping B eyes closed or opening only one eye ?  ?Perception   ?  ?Praxis   ?  ? ?Cognition Arousal/Alertness: Lethargic ?Behavior During Therapy: Flat affect ?Overall Cognitive Status: Impaired/Different from baseline ?Area of Impairment: Attention, Following commands, Safety/judgement ?  ?  ?  ?  ?  ?  ?  ?  ?  ?Current Attention Level: Focused ?  ?Following Commands:  (not following commands) ?  Safety/Judgement: Decreased awareness of safety (pulls at lines) ?  ?  ?General Comments: pt initially shaking her head no when asked if she wanted to sit up with therapy ?  ?  ?   ?Exercises   ? ?  ?Shoulder Instructions   ? ? ?  ?General Comments    ? ? ?Pertinent Vitals/ Pain       Pain Assessment ?Pain Assessment: Faces ?Faces Pain Scale: No hurt ? ?Home Living   ?  ?  ?  ?  ?  ?  ?  ?  ?  ?  ?  ?  ?  ?  ?  ?  ?  ?  ? ?  ?Prior Functioning/Environment    ?  ?  ?  ?   ? ?Frequency ? Min 2X/week   ? ? ? ? ?  ?Progress Toward Goals ? ?OT Goals(current goals can now be found in the care plan section) ? Progress towards OT goals: Not progressing toward goals - comment ? ?Acute Rehab OT Goals ?OT Goal Formulation: Patient unable to participate in goal setting ?Time For Goal Achievement: 03/02/22 ?Potential to Achieve Goals: Fair  ?Plan Discharge plan needs to be updated   ? ?Co-evaluation ? ? ? PT/OT/SLP Co-Evaluation/Treatment: Yes ?Reason for Co-Treatment: Complexity of the patient's impairments (multi-system involvement);For patient/therapist safety ?  ?OT goals addressed during session: Strengthening/ROM ?  ? ?  ?AM-PAC OT "6 Clicks" Daily Activity     ?Outcome Measure ? ? Help from another person eating meals?: Total ?Help from another person taking care of personal grooming?: Total ?Help from another person toileting, which includes using toliet, bedpan, or urinal?: Total ?Help from another person bathing (including washing, rinsing, drying)?: Total ?Help from another person to put on and taking off regular upper body clothing?: Total ?Help from another person to put on and taking off regular lower body clothing?: Total ?6 Click Score: 6 ? ?  ?End of Session Equipment Utilized During Treatment: Oxygen ? ?OT Visit Diagnosis: Muscle weakness (generalized) (M62.81);Other symptoms and signs involving cognitive function;Hemiplegia and hemiparesis ?Hemiplegia - Right/Left: Left ?Hemiplegia - dominant/non-dominant: Dominant ?Hemiplegia - caused by: Nontraumatic intracerebral hemorrhage ?  ?Activity Tolerance Patient tolerated treatment well ?  ?Patient Left in bed;with call bell/phone within reach ?  ?Nurse Communication  (aware of elevated BP, provided IV medication) ?  ? ?   ? ?Time: [id number] ?OT Time Calculation (min): 40 min ? ?Charges: OT General Charges ?$OT Visit: 1 Visit ?OT Treatments ?$Neuromuscular Re-education: 8-22 mins ? ?Nestor Lewandowsky, OTR/L ?Acute Rehabilitation Services ?Pager:  [phone] ?Office: [phone]  ? ?Malka So ?02/20/2022, 1:43 PM ?

## 2022-02-21 ENCOUNTER — Inpatient Hospital Stay (HOSPITAL_COMMUNITY): Payer: 59

## 2022-02-21 LAB — BASIC METABOLIC PANEL
Anion gap: 13 (ref 5–15)
BUN: 37 mg/dL — ABNORMAL HIGH (ref 6–20)
CO2: 25 mmol/L (ref 22–32)
Calcium: 11.3 mg/dL — ABNORMAL HIGH (ref 8.9–10.3)
Chloride: 96 mmol/L — ABNORMAL LOW (ref 98–111)
Creatinine, Ser: 1.16 mg/dL — ABNORMAL HIGH (ref 0.44–1.00)
GFR, Estimated: 58 mL/min — ABNORMAL LOW (ref >60)
Glucose, Bld: 190 mg/dL — ABNORMAL HIGH (ref 70–99)
Potassium: 5.1 mmol/L (ref 3.5–5.1)
Sodium: 134 mmol/L — ABNORMAL LOW (ref 135–145)

## 2022-02-21 LAB — CBC WITH DIFFERENTIAL/PLATELET
Abs Immature Granulocytes: 0.1 10*3/uL — ABNORMAL HIGH (ref 0.00–0.07)
Basophils Absolute: 0 10*3/uL (ref 0.0–0.1)
Basophils Relative: 0 %
Eosinophils Absolute: 0.1 10*3/uL (ref 0.0–0.5)
Eosinophils Relative: 1 %
HCT: 42.3 % (ref 36.0–46.0)
Hemoglobin: 13.7 g/dL (ref 12.0–15.0)
Immature Granulocytes: 1 %
Lymphocytes Relative: 23 %
Lymphs Abs: 2.3 10*3/uL (ref 0.7–4.0)
MCH: 28.4 pg (ref 26.0–34.0)
MCHC: 32.4 g/dL (ref 30.0–36.0)
MCV: 87.6 fL (ref 80.0–100.0)
Monocytes Absolute: 0.5 10*3/uL (ref 0.1–1.0)
Monocytes Relative: 5 %
Neutro Abs: 7.2 10*3/uL (ref 1.7–7.7)
Neutrophils Relative %: 70 %
Platelets: 252 10*3/uL (ref 150–400)
RBC: 4.83 MIL/uL (ref 3.87–5.11)
RDW: 13.5 % (ref 11.5–15.5)
WBC: 10.2 10*3/uL (ref 4.0–10.5)
nRBC: 0 % (ref 0.0–0.2)

## 2022-02-21 LAB — D-DIMER, QUANTITATIVE: D-Dimer, Quant: 4.83 ug/mL-FEU — ABNORMAL HIGH (ref 0.00–0.50)

## 2022-02-21 LAB — GLUCOSE, CAPILLARY
Glucose-Capillary: 149 mg/dL — ABNORMAL HIGH (ref 70–99)
Glucose-Capillary: 165 mg/dL — ABNORMAL HIGH (ref 70–99)
Glucose-Capillary: 167 mg/dL — ABNORMAL HIGH (ref 70–99)
Glucose-Capillary: 175 mg/dL — ABNORMAL HIGH (ref 70–99)
Glucose-Capillary: 177 mg/dL — ABNORMAL HIGH (ref 70–99)
Glucose-Capillary: 209 mg/dL — ABNORMAL HIGH (ref 70–99)

## 2022-02-21 MED ORDER — VANCOMYCIN HCL 1750 MG/350ML IV SOLN
1750.0000 mg | Freq: Once | INTRAVENOUS | Status: AC
  Administered 2022-02-21: 1750 mg via INTRAVENOUS
  Filled 2022-02-21: qty 350

## 2022-02-21 MED ORDER — IOHEXOL 350 MG/ML SOLN
60.0000 mL | Freq: Once | INTRAVENOUS | Status: AC | PRN
  Administered 2022-02-21: 60 mL via INTRAVENOUS

## 2022-02-21 MED ORDER — VANCOMYCIN HCL 1250 MG/250ML IV SOLN
1250.0000 mg | INTRAVENOUS | Status: DC
  Filled 2022-02-21: qty 250

## 2022-02-21 MED ORDER — SENNOSIDES-DOCUSATE SODIUM 8.6-50 MG PO TABS: 1.0000 | ORAL_TABLET | Freq: Two times a day (BID) | ORAL | Status: DC | PRN

## 2022-02-21 MED ORDER — SODIUM CHLORIDE 0.9 % IV SOLN: 2.0000 g | INTRAVENOUS | Status: DC

## 2022-02-21 MED ORDER — SODIUM CHLORIDE 0.9 % IV SOLN
500.0000 mg | INTRAVENOUS | Status: DC
  Administered 2022-02-21: 500 mg via INTRAVENOUS
  Filled 2022-02-21: qty 5

## 2022-02-21 MED ORDER — SODIUM CHLORIDE 0.9 % IV SOLN
2.0000 g | Freq: Two times a day (BID) | INTRAVENOUS | Status: DC
  Administered 2022-02-21 – 2022-02-22 (×3): 2 g via INTRAVENOUS
  Filled 2022-02-21 (×3): qty 2

## 2022-02-21 NOTE — Progress Notes (Signed)
Pharmacy Antibiotic Note ? ?Sarah Myers is a 51 y.o. female admitted on 02/05/2022 with sepsis.  Pharmacy has been consulted for Vancomycin dosing. ? ?Recent cultures + for Serratia and MSSA, treated with cefepime ?Renal function acutely worse today, Scr 0.94>>1.16 ? ?Plan: ?Vancomycin 1750mg  LD x1 followed by 1250 mg q24hr (eAUC 512) ?Plan to obtain levels at steady state if therapy continued  ?Cefepime 2gm q12hr ?Will monitor for acute changes in renal function and adjust as needed ?F/u cultures results and de-escalate as appropriate ? ? ?Height: 5\' 4"  (162.6 cm) ?Weight: 73 kg (160 lb 15 oz) ?IBW/kg (Calculated) : 54.7 ? ?Temp (24hrs), Avg:100.6 ?F (38.1 ?C), Min:100.1 ?F (37.8 ?C), Max:101.3 ?F (38.5 ?C) ? ?Recent Labs  ?Lab 02/15/22 ? 02/16/22 ?2035 02/17/22 ?5974 02/18/22 ?1638 02/19/22 ?4536 02/20/22 ?4680 02/21/22 ?0407  ?WBC 9.8  --   --  11.6* 10.1 9.2 10.2  ?CREATININE 1.15*   < > 1.06* 0.93 0.94 0.94 1.16*  ? < > = values in this interval not displayed.  ?  ?Estimated Creatinine Clearance: 57.4 mL/min (A) (by C-G formula based on SCr of 1.16 mg/dL (H)).   ? ?Allergies  ?Allergen Reactions  ? Lisinopril Cough  ? ? ?Thank you for allowing pharmacy to be a part of this patient?s care. ? ?[id number], PharmD ?Clinical Pharmacist ? ?Please check AMION for all [facility] Pharmacy numbers ?After 10:00 PM, call Main Pharmacy [phone] ? ? ?

## 2022-02-21 NOTE — Progress Notes (Signed)
Pt received on the unit [facility]  ?

## 2022-02-21 NOTE — Progress Notes (Signed)
PROGRESS NOTE  Sarah Myers WCB:[id number] DOB: [birth year] DOA: 02/05/2022 PCP: [name], MD  HPI/Recap of past 24 hours: 51 y/o female came to the [facility] ER on 2/19 with left sided weakness and slurred speech. Noted to have a right ICH with brain compression present on admission. Patient was admitted to neuro ICU, received 3% saline, cleviprex, and eventually intubated on 2/19.  Patient failed extubation x2, and subsequently had a tracheostomy on 2/28, trach collar placed on 3/1, was eventually taken off vent on 3/4.  Patient following commands.  [facility] assumed care on 02/20/2022.    Today, patient noted to be more lethargic, not following commands unlike yesterday.  Noted temp spikes.  Discussed with daughter at bedside.   Assessment/Plan: Principal Problem:   ICH (intracerebral hemorrhage) (HCC) Active Problems:   Malignant hypertension   Cerebral edema (HCC)   Brain compression (HCC)   Pressure injury of skin   Acute respiratory failure (HCC)   Oliguria   AKI (acute kidney injury) ([place])   Right temporal hematoma with brain compression Mentation fluctuating, was able to follow commands, nods to yes, shakes her head to no as on 02/20/22 Continue clonazepam Continue seroquel PT/OT/SLP consult Continue oxycodone prn Neurology signed off   Acute hypoxemic respiratory failure due to acute pulmonary edema Stridor due to upper airway edema, failed extubation x 2 Tracheostomy on 2/28 On tube feeds (covering with insulin) Continue trach collar Continue pulmonary toilette per routine S/P Lasix daily SLP to attempt speech with PMSV  Elevated D-dimer Likely multifactorial CTA chest pending  Possible sepsis due to pneumonia Noted spikes of temp, tmax 101.3, tachypnea, no leukocytosis BC x2 pending Tracheal aspirate Gram stain showed gram-positive cocci, gram-negative rods, culture preliminary growing Staph aureus await final susceptibility CXR showing patchy  left airspace disease, although noted to be improving  CTA chest pending as mentioned above Of note, patient grew Serratia and MSSA from previous tracheal aspirate taken on 2/23, patient completed a course of IV cefepime Start IV vancomycin, IV cefepime, azithromycin, and de-escalate pending cultures  Hypertensive emergency BP improving Continue coreg, losartan, amlodipine, spironolactone, held hctz due to mild bump in creatinine, continue clonidine prn Plan to adjust pending BP due to recent sepsis      Malnutrition Type:  Nutrition Problem: Inadequate oral intake Etiology: inability to eat   Malnutrition Characteristics:  Signs/Symptoms: NPO status   Nutrition Interventions:  Interventions: Tube feeding, Prostat    Estimated body mass index is 27.62 kg/m as calculated from the following:   Height as of this encounter: _0  (1.626 m).   Weight as of this encounter: 73 kg.     Code Status: Full  Family Communication: Discussed with daughter at bedside  Disposition Plan: Status is: Inpatient Remains inpatient appropriate because: Level of care      Consultants: Neurology PCCM  Procedures: Mechanical ventilation  Antimicrobials: Vancomycin Cefepime Azithromycin  DVT prophylaxis: Heparin [place]   Objective: Vitals:   02/21/22 1200 02/21/22 1300 02/21/22 1400 02/21/22 1520  BP: 125/73 136/89 (!) 143/87   Pulse:  (!) 183 82 82  Resp: (!) 26 (!) 29 (!) 29 (!) 30  Temp: 99.1 F (37.3 C)  99.3 F (37.4 C)   TempSrc: Axillary  Oral   SpO2:  93% 97% 93%  Weight:      Height:        Intake/Output Summary (Last 24 hours) at 02/21/2022 1648 Last data filed at 02/21/2022 1400 Gross per 24 hour  Intake 1297.24  ml  Output 1800 ml  Net -502.76 ml   Filed Weights   02/14/22 0500 02/16/22 0329 02/21/22 0500  Weight: 76.7 kg 72.9 kg 73 kg    Exam: General: NAD, trach collar  Cardiovascular: S1, S2 present Respiratory: Coarse breath sounds  bilaterally Abdomen: Soft, nontender, nondistended, bowel sounds present Musculoskeletal: No bilateral pedal edema noted Skin: Normal Psychiatry: Unable to assess    Data Reviewed: CBC: Recent Labs  Lab 02/15/22 0513 02/18/22 0527 02/19/22 0539 02/20/22 0504 02/21/22 0407  WBC 9.8 11.6* 10.1 9.2 10.2  NEUTROABS  --   --   --   --  7.2  HGB 10.0* 10.1* 11.1* 11.2* 13.7  HCT 32.4* 33.2* 35.7* 34.0* 42.3  MCV 92.3 92.2 91.8 89.9 87.6  PLT 124* 157 170 164 633   Basic Metabolic Panel: Recent Labs  Lab 02/17/22 0523 02/18/22 0527 02/19/22 0539 02/20/22 0504 02/21/22 0407  NA 147* 143 141 135 134*  K 4.1 4.4 4.3 4.4 5.1  CL 112* 113* 105 100 96*  CO2 _0 GLUCOSE 193* 193* 162* 215* 190*  BUN 35* 32* 31* 30* 37*  CREATININE 1.06* 0.93 0.94 0.94 1.16*  CALCIUM 9.9 9.6 10.3 9.9 11.3*   GFR: Estimated Creatinine Clearance: 57.4 mL/min (A) (by C-G formula based on SCr of 1.16 mg/dL (H)). Liver Function Tests: Recent Labs  Lab 02/19/22 0539  AST 34  ALT 54*  ALKPHOS 54  BILITOT 0.3  PROT 6.8  ALBUMIN 2.6*   No results for input(s): LIPASE, AMYLASE in the last 168 hours. No results for input(s): AMMONIA in the last 168 hours. Coagulation Profile: No results for input(s): INR, PROTIME in the last 168 hours.  Cardiac Enzymes: No results for input(s): CKTOTAL, CKMB, CKMBINDEX, TROPONINI in the last 168 hours. BNP (last 3 results) No results for input(s): PROBNP in the last 8760 hours. HbA1C: No results for input(s): HGBA1C in the last 72 hours. CBG: Recent Labs  Lab 02/20/22 2345 02/21/22 0345 02/21/22 0742 02/21/22 1152 02/21/22 1634  GLUCAP 170* 175* 177* 167* 209*   Lipid Profile: No results for input(s): CHOL, HDL, LDLCALC, TRIG, CHOLHDL, LDLDIRECT in the last 72 hours. Thyroid Function Tests: No results for input(s): TSH, T4TOTAL, FREET4, T3FREE, THYROIDAB in the last 72 hours. Anemia Panel: No results for input(s): VITAMINB12, FOLATE,  FERRITIN, TIBC, IRON, RETICCTPCT in the last 72 hours. Urine analysis:    Component Value Date/Time   COLORURINE YELLOW 02/05/2022 [place] 02/05/2022 1604   LABSPEC <1.005 (L) 02/05/2022 1604   PHURINE 6.0 02/05/2022 1604   GLUCOSEU NEGATIVE 02/05/2022 1604   HGBUR NEGATIVE 02/05/2022 [place] 02/05/2022 [place] 02/05/2022 1604   PROTEINUR 100 (A) 02/05/2022 1604   UROBILINOGEN 1.0 09/20/2015 1448   NITRITE NEGATIVE 02/05/2022 1604   LEUKOCYTESUR NEGATIVE 02/05/2022 1604   Sepsis Labs: _1 (procalcitonin:4,lacticidven:4)  ) Recent Results (from the past 240 hour(s))  Culture, Respiratory w Gram Stain     Status: None (Preliminary result)   Collection Time: 02/20/22  3:47 PM   Specimen: Tracheal Aspirate; Respiratory  Result Value Ref Range Status   Specimen Description TRACHEAL ASPIRATE  Final   Special Requests NONE  Final   Gram Stain   Final    RARE WBC PRESENT, PREDOMINANTLY MONONUCLEAR RARE GRAM POSITIVE COCCI IN PAIRS RARE GRAM NEGATIVE RODS    Culture   Final    MODERATE STAPHYLOCOCCUS AUREUS SUSCEPTIBILITIES TO FOLLOW Performed at [facility]  [facility], [place] [address], [place], [place] [address]    Report Status PENDING  Incomplete  Culture, blood (routine x 2)     Status: None (Preliminary result)   Collection Time: 02/21/22  4:02 AM   Specimen: BLOOD LEFT HAND  Result Value Ref Range Status   Specimen Description BLOOD LEFT HAND  Final   Special Requests   Final    BOTTLES DRAWN AEROBIC ONLY Blood Culture results may not be optimal due to an inadequate volume of blood received in culture bottles   Culture   Final    NO GROWTH < 12 HOURS Performed at [facility], [place] [address], [place], [place] [address]    Report Status PENDING  Incomplete  Culture, blood (routine x 2)     Status: None (Preliminary result)   Collection Time: 02/21/22  4:07 AM   Specimen: BLOOD  Result Value Ref Range  Status   Specimen Description BLOOD LEFT ARM  Final   Special Requests   Final    BOTTLES DRAWN AEROBIC AND ANAEROBIC Blood Culture results may not be optimal due to an inadequate volume of blood received in culture bottles   Culture   Final    NO GROWTH < 12 HOURS Performed at [facility], [place] [address], [place],  [address]    Report Status PENDING  Incomplete      Studies: No results found.  Scheduled Meds:  amLODipine  10 mg Per Tube Daily   carvedilol  25 mg Per Tube BID WC   chlorhexidine gluconate (MEDLINE KIT)  15 mL Mouth Rinse BID   Chlorhexidine Gluconate Cloth  6 each Topical Q0600   clonazePAM  1.5 mg Per Tube BID   feeding supplement (PROSource TF)  45 mL Per Tube BID   guaiFENesin  200 mg Per Tube Q6H   heparin injection (subcutaneous)  5,000 Units Subcutaneous Q8H   hydrochlorothiazide  25 mg Per Tube Daily   insulin aspart  0-15 Units Subcutaneous Q4H   losartan  100 mg Per Tube Daily   mouth rinse  15 mL Mouth Rinse 10 times per day   multivitamin with minerals  1 tablet Per Tube Daily   nystatin  5 mL Oral QID   pantoprazole sodium  40 mg Per Tube Daily   QUEtiapine  50 mg Per Tube QHS   sodium chloride flush  10-40 mL Intracatheter Q12H   spironolactone  50 mg Per Tube Daily    Continuous Infusions:  ceFEPime (MAXIPIME) IV Stopped (02/21/22 1023)   feeding supplement (JEVITY 1.5 CAL/FIBER) 1,500 mL (02/20/22 2022)   [START ON 02/22/2022] vancomycin       LOS: 16 days     Alma Friendly, MD [facility]  If 7PM-7AM, please contact night-coverage [email or web address] 02/21/2022, 4:48 PM

## 2022-02-21 NOTE — Progress Notes (Signed)
CSW acknowledges SNF consult. Barriers for placement include lack of insurance and tracheostomy. CSW emailed financial counseling to screen patient for Medicaid/Disability.  ? ?Cristobal Goldmann ?LCSW, MSW, MHA ? ?

## 2022-02-21 NOTE — Progress Notes (Signed)
?  X-cover Note: ?Received call regarding pt's positive CTPA for bilateral PE without heart strain. Appears pt admitted on 02-05-2022 for ICH. Last CT head on 02-12-2022 that showed continue right basal ganglia and temporal lobe hematoma. Have sent secure chat to attending physician regarding pt's need for systemic anticoagulation with recent ICH. Will ask neurology to review case and place formal recs in chart. ? ?Kristopher Oppenheim, DO ?[facility] ? ?

## 2022-02-22 ENCOUNTER — Inpatient Hospital Stay (HOSPITAL_COMMUNITY): Payer: 59

## 2022-02-22 ENCOUNTER — Encounter (HOSPITAL_COMMUNITY): Payer: Medicaid Other

## 2022-02-22 DIAGNOSIS — I2699 Other pulmonary embolism without acute cor pulmonale: Secondary | ICD-10-CM

## 2022-02-22 LAB — GLUCOSE, CAPILLARY
Glucose-Capillary: 159 mg/dL — ABNORMAL HIGH (ref 70–99)
Glucose-Capillary: 165 mg/dL — ABNORMAL HIGH (ref 70–99)
Glucose-Capillary: 168 mg/dL — ABNORMAL HIGH (ref 70–99)
Glucose-Capillary: 153 mg/dL — ABNORMAL HIGH (ref 70–99)
Glucose-Capillary: 149 mg/dL — ABNORMAL HIGH (ref 70–99)
Glucose-Capillary: 162 mg/dL — ABNORMAL HIGH (ref 70–99)

## 2022-02-22 LAB — BASIC METABOLIC PANEL
Anion gap: 10 (ref 5–15)
BUN: 46 mg/dL — ABNORMAL HIGH (ref 6–20)
CO2: 23 mmol/L (ref 22–32)
Calcium: 10.6 mg/dL — ABNORMAL HIGH (ref 8.9–10.3)
Chloride: 98 mmol/L (ref 98–111)
Creatinine, Ser: 1.21 mg/dL — ABNORMAL HIGH (ref 0.44–1.00)
GFR, Estimated: 55 mL/min — ABNORMAL LOW (ref >60)
Glucose, Bld: 146 mg/dL — ABNORMAL HIGH (ref 70–99)
Potassium: 4.3 mmol/L (ref 3.5–5.1)
Sodium: 131 mmol/L — ABNORMAL LOW (ref 135–145)

## 2022-02-22 LAB — HEPARIN LEVEL (UNFRACTIONATED)
Heparin Unfractionated: 0.3 IU/mL (ref 0.30–0.70)
Heparin Unfractionated: 0.26 IU/mL — ABNORMAL LOW (ref 0.30–0.70)
Heparin Unfractionated: 0.28 IU/mL — ABNORMAL LOW (ref 0.30–0.70)

## 2022-02-22 LAB — CBC WITH DIFFERENTIAL/PLATELET
Abs Immature Granulocytes: 0.07 10*3/uL (ref 0.00–0.07)
Basophils Absolute: 0.1 10*3/uL (ref 0.0–0.1)
Basophils Relative: 1 %
Eosinophils Absolute: 0.2 10*3/uL (ref 0.0–0.5)
Eosinophils Relative: 2 %
HCT: 38.8 % (ref 36.0–46.0)
Hemoglobin: 12.5 g/dL (ref 12.0–15.0)
Immature Granulocytes: 1 %
Lymphocytes Relative: 21 %
Lymphs Abs: 2.2 10*3/uL (ref 0.7–4.0)
MCH: 29.1 pg (ref 26.0–34.0)
MCHC: 32.2 g/dL (ref 30.0–36.0)
MCV: 90.2 fL (ref 80.0–100.0)
Monocytes Absolute: 0.8 10*3/uL (ref 0.1–1.0)
Monocytes Relative: 7 %
Neutro Abs: 7.2 10*3/uL (ref 1.7–7.7)
Neutrophils Relative %: 68 %
Platelets: 206 10*3/uL (ref 150–400)
RBC: 4.3 MIL/uL (ref 3.87–5.11)
RDW: 13.8 % (ref 11.5–15.5)
WBC: 10.5 10*3/uL (ref 4.0–10.5)
nRBC: 0 % (ref 0.0–0.2)

## 2022-02-22 LAB — CULTURE, RESPIRATORY W GRAM STAIN

## 2022-02-22 MED ORDER — HEPARIN (PORCINE) 25000 UT/250ML-% IV SOLN
1300.0000 [IU]/h | INTRAVENOUS | Status: DC
  Administered 2022-02-22: 900 [IU]/h via INTRAVENOUS
  Administered 2022-02-23: 04:00:00 1150 [IU]/h via INTRAVENOUS
  Administered 2022-02-23: 23:00:00 1300 [IU]/h via INTRAVENOUS
  Filled 2022-02-22 (×5): qty 250

## 2022-02-22 MED ORDER — CEFAZOLIN SODIUM-DEXTROSE 2-4 GM/100ML-% IV SOLN
2.0000 g | Freq: Three times a day (TID) | INTRAVENOUS | Status: AC
  Administered 2022-02-22 – 2022-03-02 (×24): 2 g via INTRAVENOUS
  Filled 2022-02-22 (×26): qty 100

## 2022-02-22 NOTE — Progress Notes (Signed)
PT Cancellation Note ? ?Patient Details ?Name: Sarah Myers ?MRN: [id number] ?DOB: September 20, 1971 ? ? ?Cancelled Treatment:    Reason Eval/Treat Not Completed: Patient at procedure or test/unavailable (Pt in CT this am.  Will return as able.) ? ? ?Sarah Myers ?02/22/2022, 10:18 AM ?Sarah Myers,PT ?Acute Rehab Services ?[phone] ?[phone] (pager)  ?

## 2022-02-22 NOTE — Progress Notes (Signed)
ANTICOAGULATION CONSULT NOTE - Initial Consult ? ?Pharmacy Consult for heparin ?Indication: pulmonary embolus ? ?Allergies  ?Allergen Reactions  ? Lisinopril Cough  ? ? ?Patient Measurements: ?Height: 5\' 4"  (162.6 cm) ?Weight: 73 kg (160 lb 15 oz) ?IBW/kg (Calculated) : 54.7 ?Heparin Dosing Weight: 70kg ? ?Vital Signs: ?Temp: 98.6 ?F (37 ?C) (03/07 2334) ?Temp Source: Axillary (03/07 2334) ?BP: 127/70 (03/07 2334) ?Pulse Rate: 78 (03/07 2340) ? ?Labs: ?Recent Labs  ?  02/19/22 ?04/21/22 02/20/22 ?04/22/22 02/21/22 ?0407  ?HGB 11.1* 11.2* 13.7  ?HCT 35.7* 34.0* 42.3  ?PLT 170 164 252  ?CREATININE 0.94 0.94 1.16*  ? ? ?Estimated Creatinine Clearance: 57.4 mL/min (A) (by C-G formula based on SCr of 1.16 mg/dL (H)). ? ? ?Medical History: ?Past Medical History:  ?Diagnosis Date  ? Hypertension   ? ? ?Assessment: ?51 year old female with new CTA positive for bilateral PE without heart strain. Of note patient recently admitted on 02/05/22 for ICH. Patient discussed with on call neurology and will start low dose heparin without bolus. Patient not on anticoagulants prior to admission. Hemoglobin stable in 11s, platelet within normal limits.  ? ? ?Goal of Therapy:  ?Heparin level 0.3-0.5 units/ml ?Monitor platelets by anticoagulation protocol: Yes ?  ?Plan:  ?Start heparin infusion at 900 units/hr ?Check anti-Xa level in 6 hours and daily while on heparin ?Continue to monitor H&H and platelets ? ?02/07/22 PharmD., BCPS ?Clinical Pharmacist ?02/22/2022 1:06 AM ? ?

## 2022-02-22 NOTE — Progress Notes (Signed)
Patient seen today by trach team for consult.  No education needed at this time.  All the necessary equipment is at the bedside.  Will continue to follow for progression.   ?

## 2022-02-22 NOTE — Progress Notes (Addendum)
ANTICOAGULATION CONSULT NOTE - Follow Up Consult ? ?Pharmacy Consult for Heparin ?Indication: pulmonary embolus ? ?Allergies  ?Allergen Reactions  ? Lisinopril Cough  ? ? ?Patient Measurements: ?Height: 5\' 4"  (162.6 cm) ?Weight: 73 kg (160 lb 15 oz) ?IBW/kg (Calculated) : 54.7 ?Heparin Dosing Weight: 70 kg ? ?Vital Signs: ?Temp: 98.5 ?F (36.9 ?C) (03/08 0700) ?Temp Source: (P) Axillary (03/08 0700) ?BP: 143/82 (03/08 0700) ?Pulse Rate: 80 (03/08 0843) ? ?Labs: ?Recent Labs  ?  02/20/22 ?04/22/22 02/21/22 ?0407 02/22/22 ?04/24/22 02/22/22 ?04/24/22  ?HGB 11.2* 13.7 12.5  --   ?HCT 34.0* 42.3 38.8  --   ?PLT 164 252 206  --   ?HEPARINUNFRC  --   --   --  0.30  ?CREATININE 0.94 1.16* 1.21*  --   ? ? ?Estimated Creatinine Clearance: 55 mL/min (A) (by C-G formula based on SCr of 1.21 mg/dL (H)). ? ?Assessment: ?51 year old female with new CTA positive for bilateral PE without heart strain. Of note patient recently admitted on 02/05/22 for ICH. Patient discussed with on call Neurology and started low dose heparin without bolus ~2am on 3/8. Patient not on anticoagulants prior to admission. Had been on SQ heparin for VTE prophylaxis since 02/09/22. ? ? Initial heparin level is 0.30, at low end of target low range. Drawn ~5.5 hours after infusion begun. CBC stable.  ?  ?Goal of Therapy:  ?Heparin level 0.3-0.5 units/ml ?Monitor platelets by anticoagulation protocol: Yes ?  ?Plan:  ?Continue heparin drip at 900 units/hr. ?Heparin level at 2pm today, 6 hrs after initial level. ?Daily heparin level and CBC while on heparin. ?Monitor for signs/symptoms of bleeding. ? ?02/11/22, RPh ?02/22/2022,9:53 AM ? ?Addendum: ?  Heparin level this afternoon is 0.26, just below goal on 900 units/hr ?   Will increase to 1000 unit/hr and recheck level ~6 hours after rate change. ? ?04/24/2022, RPh ?02/22/2022 ?3:13 PM ? ? ?

## 2022-02-22 NOTE — Progress Notes (Signed)
Speech Language Pathology Treatment: Sarah Myers Speaking valve  ?Patient Details ?Name: Sarah Myers ?MRN: [id number] ?DOB: 11-Feb-1971 ?Today's Date: 02/22/2022 ?Time: 1451-1510 ?SLP Time Calculation (min) (ACUTE ONLY): 19 min ? ?Assessment / Plan / Recommendation ?Clinical Impression ? Pt was seen for ongoing PMV trials - has transferred to floor since last seen by SLP.  Poorly responsive today. Cool washcloth applied to face; maintained eyes closed. Cuff deflated at baseline. VS stable - PMV applied; pt achieved some spontaneous voicing. Valve was removed intermittently to assess for back pressure - it was noted inconsistently.  Coughing noted occasionally; voicing appeared to be unintentional.  Continued efforts to rouse pt were not successful. Valve was removed, cleaned and replaced in container.  No ice chip trials were attempted given mental status. ? ?SLP will continue to follow for PMV/swallowing. ?  ?HPI HPI: 51 y/o female came to the [facility] ER on 2/19 with left sided weakness and slurred speech.  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 Intubated 2/19. Tracheostomy 2/28 (shiley 6, cuffed). ?  ?   ?SLP Plan ? Continue with current plan of care ? ?  ?  ?Recommendations for follow up therapy are one component of a multi-disciplinary discharge planning process, led by the attending physician.  Recommendations may be updated based on patient status, additional functional criteria and insurance authorization. ?  ? ?Recommendations  ?Diet recommendations: NPO  ?   ? Patient may use Passy-Muir Speech Valve: with SLP only ?MD: Please consider changing trach tube to : Cuffless  ?   ? ? ? ? Oral Care Recommendations: Oral care QID ?Assistance recommended at discharge: Frequent or constant Supervision/Assistance ?SLP Visit Diagnosis: Aphonia (R49.1) ?Plan: Continue with current plan of care ? ? ? ? ?  ?  ?Sarah Pyeatt L. Danniella Robben, MA CCC/SLP ?Acute Rehabilitation Services ?Office number  [phone] ?Pager [phone] ? ? ?Sarah Myers ? ?02/22/2022, 3:16 PM ?

## 2022-02-22 NOTE — Progress Notes (Signed)
STROKE TEAM PROGRESS NOTE   INTERVAL HISTORY RN is at the bedside. Pt lying in bed, eyes closed however, able to following simple commands on the right hand but very lethargic, with pain stimulation, she was able to open eyes. Overnight CTA chest showed b/l PE and put on heparin IV per stroke protocol. CT repeat showed near resolution of right large BG ICH.   Vitals:   02/22/22 0843 02/22/22 1054 02/22/22 1215 02/22/22 1550  BP:   (!) 141/78 136/65  Pulse: 80 78 78 79  Resp: (!) 22 (!) 22 19 20   Temp:   98.7 F (37.1 C) 98.9 F (37.2 C)  TempSrc:   Oral Oral  SpO2: 98% 100% 97%   Weight:      Height:       CBC:  Recent Labs  Lab 02/21/22 0407 02/22/22 0213  WBC 10.2 10.5  NEUTROABS 7.2 7.2  HGB 13.7 12.5  HCT 42.3 38.8  MCV 87.6 90.2  PLT 252 206   Basic Metabolic Panel:  Recent Labs  Lab 02/21/22 0407 02/22/22 0213  NA 134* 131*  K 5.1 4.3  CL 96* 98  CO2 25 23  GLUCOSE 190* 146*  BUN 37* 46*  CREATININE 1.16* 1.21*  CALCIUM 11.3* 10.6*   Lipid Panel:  No results for input(s): CHOL, TRIG, HDL, CHOLHDL, VLDL, LDLCALC in the last 168 hours. HgbA1c: No results for input(s): HGBA1C in the last 168 hours. Urine Drug Screen: No results for input(s): LABOPIA, COCAINSCRNUR, LABBENZ, AMPHETMU, THCU, LABBARB in the last 168 hours.  Alcohol Level No results for input(s): ETH in the last 168 hours.  IMAGING past 24 hours CT HEAD WO CONTRAST (04/24/22)  Result Date: 02/22/2022 CLINICAL DATA:  Follow-up hemorrhagic stroke. Now with pulmonary emboli EXAM: CT HEAD WITHOUT CONTRAST TECHNIQUE: Contiguous axial images were obtained from the base of the skull through the vertex without intravenous contrast. RADIATION DOSE REDUCTION: This exam was performed according to the departmental dose-optimization program which includes automated exposure control, adjustment of the mA and/or kV according to patient size and/or use of iterative reconstruction technique. COMPARISON:  02/12/2022  FINDINGS: Brain: Decreased volume and more hazy/indistinct hematoma in the deep right cerebrum at the posterior putamen and adjacent white matter, high-density measuring up to 3 cm x 2 cm on axial slices. Adjacent edema combines to cause 6 mm of midline shift. Small left inferior occipital infarcts, newly seen by CT. No hydrocephalus or extra-axial collection. Vascular: No hyperdense vessel or unexpected calcification. Skull: Normal. Negative for fracture or focal lesion. 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 Right mastoid and middle ear opacification in the setting of nasal intubation. IMPRESSION: 1. Expected regression of right ICH. Residual hematoma and swelling causes 6 mm of midline shift. 2. Small left occipital cortex infarcts which have become apparent by CT. Electronically Signed   By: 02/14/2022 M.D.   On: 02/22/2022 10:26   CT Angio Chest Pulmonary Embolism (PE) W or WO Contrast  Result Date: 02/21/2022 CLINICAL DATA:  Positive D-dimer and difficulty breathing EXAM: CT ANGIOGRAPHY CHEST WITH CONTRAST TECHNIQUE: Multidetector CT imaging of the chest was performed using the standard protocol during bolus administration of intravenous contrast. Multiplanar CT image reconstructions and MIPs were obtained to evaluate the vascular anatomy. RADIATION DOSE REDUCTION: This exam was performed according to the departmental dose-optimization program which includes automated exposure control, adjustment of the mA and/or kV according to patient size and/or use of iterative reconstruction technique. CONTRAST:  34mL OMNIPAQUE IOHEXOL 350 MG/ML SOLN COMPARISON:  Chest x-ray from the previous day. FINDINGS: Cardiovascular: Thoracic aorta and its branches are within normal limits. No aneurysmal dilatation or dissection is noted. Heart is at the upper limits of normal in size. Pulmonary artery shows a normal branching pattern bilaterally. There are  adherent peripheral filling defects identified primarily within the left lower lobe pulmonary artery. Additionally some lower lobe filling defects are noted on the right as well as upper lobe emboli on the right. No coronary calcifications are seen. Mediastinum/Nodes: Thoracic inlet demonstrates evidence of a tracheostomy tube in satisfactory position. Thyroid is unremarkable. Feeding catheter is noted within the esophagus extending into the stomach. No sizable hilar or mediastinal adenopathy is noted. Lungs/Pleura: Lungs are well aerated bilaterally with the exception of consolidation in the lower lobes bilaterally with small effusions. No sizable parenchymal nodules are seen. No pneumothorax is identified. Upper Abdomen: Visualized upper abdomen is within normal limits. Musculoskeletal: No chest wall abnormality. No acute or significant osseous findings. Review of the MIP images confirms the above findings. IMPRESSION: Bilateral pulmonary emboli as described above without evidence of right heart strain. Bilateral lower lobe consolidation with associated small effusions. These results will be called to the ordering clinician or representative by the Radiologist Assistant, and communication documented in the PACS or [employer]. Electronically Signed   By: Alcide Clever M.D.   On: 02/21/2022 19:48    PHYSICAL EXAM  Temp:  [98.5 F (36.9 C)-99.5 F (37.5 C)] 98.9 F (37.2 C) (03/08 1550) Pulse Rate:  [69-84] 79 (03/08 1550) Resp:  [19-30] 20 (03/08 1550) BP: (110-146)/(63-92) 136/65 (03/08 1550) SpO2:  [96 %-100 %] 97 % (03/08 1215) FiO2 (%):  [28 %] 28 % (03/08 1054)  General - Well nourished, well developed, in no apparent distress, but lethargic.  Ophthalmologic - fundi not visualized due to noncooperation.  Cardiovascular - Regular rhythm and rate.  Neuro - Patient lying in bed, not open eyes on voice but open eyes with pain stimulation.  On trach collar, not able to answer questions but  following simple commands on the right hand.  Spontaneous eye rolling movement to both directions, pupils 57mm bilaterally, reactive to light.  Not blinking to visual threat bilaterally. Left facial droop. Tongue midline. LUE plegia and LLE mild withdraw to pain. RUE on mitten but hand grip 3-/5. RLE 3-/5 withdraw to pain. Sensation and coordination not cooperative, gait not tested.       ASSESSMENT/PLAN [name] is a 51 y.o. female with history of HTN presenting with AMS, L sided weakness, and slurred speech. Attempted extubation 2/23 and patient had post-extubation stridor leading to re-intubation. Cords non-edematous.  Re-attempted extubation 2/26, and patient failed leading to reintubation.  Tracheostomy placed 2/28, sedation weaned. Add seroquel and start PT/OT.  Trach collar trials daily, tolerating well.   Bilateral PE Elevated D-dimer 4.83 CTA chest showed b/l PE without right heart strain On heparin IV per stroke protocol LE venous Doppler pending Management per primary team  ICH: Acute right large BG ICH likely secondary to hypertensive emergency Code Stroke CT head Positive for Acute Right Lentiform Hemorrhage tracking into the right temporal lobe. Intra-axial blood volume slightly greater than 33 mL. Mass effect with 4-5 mm leftward midline shift; superimposed chronic meningocele progressing since 2016.  Repeat CTH at 0045 with 36 mL hemorrhagic area, and 0835 repeat unchanged. CTA head & neck No large vessel occlusion, intracranial aneurysm, or abnormal vasculature associated with hemorrhage. 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 CT repeat 2/20 stable hematoma and MLS Follow-up MRI 2/21 with some extension of edema and stable ICH. Follow up CT 2/24 and 2/26 with no progression of ICH; 6mm shift on the latter CT repeat 3/8 showed near resolution of right large BG hemorrhage 2D Echo LVEF 65-70%, small pericardial effusion  present; trival MVR LDL 64 HgbA1c 5.6 VTE prophylaxis - heparin IV No antithrombotic prior to admission, now on heparin IV per stroke protocol Therapy recommendations:  CIR Disposition:  Pending    Cerebral edema CT head showed midline shift 5 mm;  Repeat CT stable hematoma and midline shift MRI with some extension of edema and stable ICH, intraventricular extension noted. Follow up CT 2/24 with no progression of ICH CT 2/26 stable with 97mm shift. CT 3/8 expected regression of right ICH, residual hematoma in setting with 6 mm of midline shift Na 134->131   Hypertensive emergency SBP as high as 250 on presentation Home meds:  Coreg 25 mg BID, HCTZ 25 Off of Cleviprex  Continue Norvasc 10 mg and Coreg 25 mg BID and cozaar 100 BP goal less than 160 Long-term BP goal normotensive   AKI Cre 0.94->1.16->1.21 Losartan restarted BMP monitoring Continue TF Management per primary team   Respiratory failure Intubated in the ED for airway protection Extubated ->re-intubated 2/23 and 2/26.   Trached 2/28 Fentanyl and versed PRN for agitation and pain On trach collar, tolerating well   Other Stroke Risk Factors Family hx stroke (Maternal Uncle)   Other Active Problems Leukocytosis: WBC downtrending 11.6-> 10.8->10.5, UA negative; Tracheal aspirate culture with moderate WBC, gram pos cocci in pairs and clusters (S. Aureus and S. Marcescens) -> on cefepime, vancomycin and azithromycin Pressure ulcer of L hand   Hospital day # 10    Marvel Plan, MD PhD Stroke Neurology 02/22/2022 4:34 PM      To contact Stroke Continuity provider, please refer to [email or web address]. After hours, contact General Neurology

## 2022-02-22 NOTE — Progress Notes (Signed)
?  X-cover Note: ?Discussed with Dr. Thomasena Edis with neurology. He has reviewed case. Advised to start low dose heparin gtts without initial bolus for treatment of pt's PE. ? ? ?Carollee Herter, DO ?[facility] ? ?

## 2022-02-22 NOTE — Progress Notes (Signed)
[facility]                                                                               Sarah Myers, is a 51 y.o. female, DOB - Mar 07, 1971, [id number] Admit date - 02/05/2022    Outpatient Primary MD for the patient is [name], MD  LOS - 17  days    Brief summary   51 y/o female came to the [facility] ER on 2/19 with left sided weakness and slurred speech. Noted to have a right ICH with brain compression present on admission. Patient was admitted to neuro ICU, received 3% saline, cleviprex, and eventually intubated on 2/19.  Patient failed extubation x2, and subsequently had a tracheostomy on 2/28, trach collar placed on 3/1, was eventually taken off vent on 3/4.   [facility] assumed care on 02/20/2022.   Assessment & Plan    Assessment and Plan:  Intracerebral hemorrhage Acute right large basal ganglion ICH probably secondary to hypertensive emergency. CT head showed acute right lentiform hemorrhage with mass effect, 4 to 5 mm leftward midline shift. Repeat CT on 2/26 shows stable hematoma. Echocardiogram showed left ventricular ejection fraction of 65 to 70%. Neurology was on board and appreciate recommendations. Repeat CT head without contrast on the morning of 3/8 showed Expected regression of right ICH. Residual hematoma and swelling causes 6 mm of midline shift. Small left occipital cortex infarcts which have become apparent by CT.    Hypertensive emergency She was weaned off Cleviprex. Continue with 10 mg of Norvasc, clonidine 0.1 mg 3 times prn,  Coreg 25 mg twice daily, spironolactone 9m daily and cozaar 100 mg daily. .    AKI Appears to have resolved.    Elevated troponins Probably secondary to hypertensive emergency. Demand ischemia. EKG does not show any ischemic changes    Acute respiratory failure secondary to pulmonary edema Stridor due to upper airway edema Failed extubation twice finally underwent tracheostomy on  02/14/2022 Continue with pulmonary toilet Continue with trach collar.  SLP on board with PMSV trials. CT angio of the chest showed Bilateral pulmonary emboli as described above without evidence of right heart strain. Bilateral lower lobe consolidation with associated small effusions.   Nutrition s/p cortrak and is on tube feeds.      Malnutrition Type:  Nutrition Problem: Inadequate oral intake Etiology: inability to eat   Malnutrition Characteristics:  Signs/Symptoms: NPO status   Nutrition Interventions:  Interventions: Tube feeding, Prostat  Estimated body mass index is 27.62 kg/m as calculated from the following:   Height as of this encounter: _0  (1.626 m).   Weight as of this encounter: 73 kg.  Code Status: full code.  DVT Prophylaxis:  SCD's Start: 02/05/22 0846   Level of Care: Level of care: Progressive Family Communication: None at bedside.   Disposition Plan:     Remains inpatient appropriate:  unsafe d/c plan.   Procedures:  CT angio of the chest.   Consultants:   Neurology   Antimicrobials:   Anti-infectives (From admission, onward)    Start     Dose/Rate Route Frequency Ordered Stop   02/22/22 2200  ceFAZolin (ANCEF) IVPB 2g/100 mL premix  2 g 200 mL/hr over 30 Minutes Intravenous Every 8 hours 02/22/22 1101     02/22/22 1100  vancomycin (VANCOREADY) IVPB 1250 mg/250 mL  Status:  Discontinued        1,250 mg 166.7 mL/hr over 90 Minutes Intravenous Every 24 hours 02/21/22 1150 02/22/22 1059   02/21/22 1800  azithromycin (ZITHROMAX) 500 mg in sodium chloride 0.9 % 250 mL IVPB  Status:  Discontinued        500 mg 250 mL/hr over 60 Minutes Intravenous Every 24 hours 02/21/22 1701 02/22/22 1059   02/21/22 1000  vancomycin (VANCOREADY) IVPB 1750 mg/350 mL        1,750 mg 175 mL/hr over 120 Minutes Intravenous  Once 02/21/22 0900 02/21/22 1246   02/21/22 1000  ceFEPIme (MAXIPIME) 2 g in sodium chloride 0.9 % 100 mL IVPB  Status:   Discontinued        2 g 200 mL/hr over 30 Minutes Intravenous Every 12 hours 02/21/22 0903 02/22/22 1059   02/21/22 0945  cefTRIAXone (ROCEPHIN) 2 g in sodium chloride 0.9 % 100 mL IVPB  Status:  Discontinued        2 g 200 mL/hr over 30 Minutes Intravenous Every 24 hours 02/21/22 0850 02/21/22 0903   02/14/22 0945  ceFEPIme (MAXIPIME) 2 g in sodium chloride 0.9 % 100 mL IVPB        2 g 200 mL/hr over 30 Minutes Intravenous Every 8 hours 02/14/22 0845 02/16/22 0713   02/10/22 1400  vancomycin (VANCOCIN) IVPB 1000 mg/200 mL premix  Status:  Discontinued        1,000 mg 200 mL/hr over 60 Minutes Intravenous Every 24 hours 02/09/22 1214 02/10/22 0808   02/09/22 1300  vancomycin (VANCOREADY) IVPB 1500 mg/300 mL        1,500 mg 150 mL/hr over 120 Minutes Intravenous  Once 02/09/22 1214 02/09/22 1608   02/09/22 1245  ceFEPIme (MAXIPIME) 2 g in sodium chloride 0.9 % 100 mL IVPB  Status:  Discontinued        2 g 200 mL/hr over 30 Minutes Intravenous Every 12 hours 02/09/22 1145 02/14/22 0845        Medications  Scheduled Meds:  amLODipine  10 mg Per Tube Daily   carvedilol  25 mg Per Tube BID WC   chlorhexidine gluconate (MEDLINE KIT)  15 mL Mouth Rinse BID   Chlorhexidine Gluconate Cloth  6 each Topical Q0600   clonazePAM  1.5 mg Per Tube BID   feeding supplement (PROSource TF)  45 mL Per Tube BID   guaiFENesin  200 mg Per Tube Q6H   insulin aspart  0-15 Units Subcutaneous Q4H   losartan  100 mg Per Tube Daily   mouth rinse  15 mL Mouth Rinse 10 times per day   multivitamin with minerals  1 tablet Per Tube Daily   nystatin  5 mL Oral QID   pantoprazole sodium  40 mg Per Tube Daily   QUEtiapine  50 mg Per Tube QHS   sodium chloride flush  10-40 mL Intracatheter Q12H   spironolactone  50 mg Per Tube Daily   Continuous Infusions:   ceFAZolin (ANCEF) IV     feeding supplement (JEVITY 1.5 CAL/FIBER) 1,000 mL (02/21/22 2225)   heparin 1,000 Units/hr (02/22/22 1540)   PRN  Meds:.acetaminophen **OR** acetaminophen (TYLENOL) oral liquid 160 mg/5 mL **OR** acetaminophen, cloNIDine, hydrALAZINE, labetalol, midazolam, oxyCODONE, senna-docusate, sodium chloride flush    Subjective:   [name] was seen and examined  today.  Appears comfortable.  Objective:   Vitals:   02/22/22 0843 02/22/22 1054 02/22/22 1215 02/22/22 1550  BP:   (!) 141/78 136/65  Pulse: 80 78 78 79  Resp: (!) 22 (!) _0 Temp:   98.7 F (37.1 C) 98.9 F (37.2 C)  TempSrc:   Oral Oral  SpO2: 98% 100% 97%   Weight:      Height:        Intake/Output Summary (Last 24 hours) at 02/22/2022 1627 Last data filed at 02/22/2022 1457 Gross per 24 hour  Intake 450 ml  Output 1350 ml  Net -900 ml   Filed Weights   02/14/22 0500 02/16/22 0329 02/21/22 0500  Weight: 76.7 kg 72.9 kg 73 kg     Exam General exam: Appears calm and comfortable s/p cortrak. With tube feeds.  Respiratory system: Clear to auscultation. Respiratory effort normal. s/p trach on 5 lit of oxygen.  Cardiovascular system: S1 & S2 heard, RRR. No JVD,  No pedal edema. Gastrointestinal system: Abdomen is nondistended, soft and nontender.  Normal bowel sounds heard. Central nervous system: not following commands.  Extremities: no pedal edema.  Skin: No rashes,   Data Reviewed:  I have personally reviewed following labs and imaging studies   CBC Lab Results  Component Value Date   WBC 10.5 02/22/2022   RBC 4.30 02/22/2022   HGB 12.5 02/22/2022   HCT 38.8 02/22/2022   MCV 90.2 02/22/2022   MCH 29.1 02/22/2022   PLT 206 02/22/2022   MCHC 32.2 02/22/2022   RDW 13.8 02/22/2022   LYMPHSABS 2.2 02/22/2022   MONOABS 0.8 02/22/2022   EOSABS 0.2 02/22/2022   BASOSABS 0.1 91/50/5697     Last metabolic panel Lab Results  Component Value Date   NA 131 (L) 02/22/2022   K 4.3 02/22/2022   CL 98 02/22/2022   CO2 23 02/22/2022   BUN 46 (H) 02/22/2022   CREATININE 1.21 (H) 02/22/2022   GLUCOSE 146 (H)  02/22/2022   GFRNONAA 55 (L) 02/22/2022   GFRAA 119 08/18/2020   CALCIUM 10.6 (H) 02/22/2022   PHOS 3.1 02/08/2022   PROT 6.8 02/19/2022   ALBUMIN 2.6 (L) 02/19/2022   LABGLOB 3.5 06/29/2020   AGRATIO 1.3 06/29/2020   BILITOT 0.3 02/19/2022   ALKPHOS 54 02/19/2022   AST 34 02/19/2022   ALT 54 (H) 02/19/2022   ANIONGAP 10 02/22/2022    CBG (last 3)  Recent Labs    02/22/22 0724 02/22/22 1235 02/22/22 1548  GLUCAP 165* 168* 153*      Coagulation Profile: No results for input(s): INR, PROTIME in the last 168 hours.   Radiology Studies: CT HEAD WO CONTRAST (5MM)  Result Date: 02/22/2022 CLINICAL DATA:  Follow-up hemorrhagic stroke. Now with pulmonary emboli EXAM: CT HEAD WITHOUT CONTRAST TECHNIQUE: Contiguous axial images were obtained from the base of the skull through the vertex without intravenous contrast. RADIATION DOSE REDUCTION: This exam was performed according to the departmental dose-optimization program which includes automated exposure control, adjustment of the mA and/or kV according to patient size and/or use of iterative reconstruction technique. COMPARISON:  02/12/2022 FINDINGS: Brain: Decreased volume and more hazy/indistinct hematoma in the deep right cerebrum at the posterior putamen and adjacent white matter, high-density measuring up to 3 cm x 2 cm on axial slices. Adjacent edema combines to cause 6 mm of midline shift. Small left inferior occipital infarcts, newly seen by CT. No hydrocephalus or extra-axial collection. Vascular: No hyperdense vessel or unexpected  calcification. Skull: Normal. Negative for fracture or focal lesion. 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 Right mastoid and middle ear opacification in the setting of nasal intubation. IMPRESSION: 1. Expected regression of right ICH. Residual hematoma and swelling causes 6 mm of midline shift. 2. Small left occipital cortex infarcts which have  become apparent by CT. Electronically Signed   By: Jorje Guild M.D.   On: 02/22/2022 10:26   CT Angio Chest Pulmonary Embolism (PE) W or WO Contrast  Result Date: 02/21/2022 CLINICAL DATA:  Positive D-dimer and difficulty breathing EXAM: CT ANGIOGRAPHY CHEST WITH CONTRAST TECHNIQUE: Multidetector CT imaging of the chest was performed using the standard protocol during bolus administration of intravenous contrast. Multiplanar CT image reconstructions and MIPs were obtained to evaluate the vascular anatomy. RADIATION DOSE REDUCTION: This exam was performed according to the departmental dose-optimization program which includes automated exposure control, adjustment of the mA and/or kV according to patient size and/or use of iterative reconstruction technique. CONTRAST:  32m OMNIPAQUE IOHEXOL 350 MG/ML SOLN COMPARISON:  Chest x-ray from the previous day. FINDINGS: Cardiovascular: Thoracic aorta and its branches are within normal limits. No aneurysmal dilatation or dissection is noted. Heart is at the upper limits of normal in size. Pulmonary artery shows a normal branching pattern bilaterally. There are adherent peripheral filling defects identified primarily within the left lower lobe pulmonary artery. Additionally some lower lobe filling defects are noted on the right as well as upper lobe emboli on the right. No coronary calcifications are seen. Mediastinum/Nodes: Thoracic inlet demonstrates evidence of a tracheostomy tube in satisfactory position. Thyroid is unremarkable. Feeding catheter is noted within the esophagus extending into the stomach. No sizable hilar or mediastinal adenopathy is noted. Lungs/Pleura: Lungs are well aerated bilaterally with the exception of consolidation in the lower lobes bilaterally with small effusions. No sizable parenchymal nodules are seen. No pneumothorax is identified. Upper Abdomen: Visualized upper abdomen is within normal limits. Musculoskeletal: No chest wall  abnormality. No acute or significant osseous findings. Review of the MIP images confirms the above findings. IMPRESSION: Bilateral pulmonary emboli as described above without evidence of right heart strain. Bilateral lower lobe consolidation with associated small effusions. These results will be called to the ordering clinician or representative by the Radiologist Assistant, and communication documented in the PACS or [employer] Electronically Signed   By: MInez CatalinaM.D.   On: 02/21/2022 19:48       VHosie PoissonM.D. [facility] 02/22/2022, 4:27 PM  Available via Epic secure chat 7am-7pm After 7 pm, please refer to night coverage provider listed on amion.

## 2022-02-22 NOTE — Progress Notes (Signed)
Occupational Therapy Treatment ?Patient Details ?Name: Sarah Myers ?MRN: [id number] ?DOB: 04-12-1971 ?Today's Date: 02/22/2022 ? ? ?History of present illness 51 y.o. female presents to [facility] on 02/05/2022 with L hemiplegia and dysarthria. 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 Pt intubated 2/19. Attempted extubation 2/23 and 2/26, requiring reintubation same date. Tracheostomy 02/14/2022. PMH includes uncontrolled HTN ?  ?OT comments ? Patient was total assist of 2 to sit EOB and was unable to follow directions but did appear to attempt to right self at one point during treatment. Patient demonstrated active RUE movement once back in supine but not to command. Patient to continue to be followed by acute OT.   ? ?Recommendations for follow up therapy are one component of a multi-disciplinary discharge planning process, led by the attending physician.  Recommendations may be updated based on patient status, additional functional criteria and insurance authorization. ?   ?Follow Up Recommendations ? Skilled nursing-short term rehab (<3 hours/day)  ?  ?Assistance Recommended at Discharge Frequent or constant Supervision/Assistance  ?Patient can return home with the following ? Two people to help with walking and/or transfers;Two people to help with bathing/dressing/bathroom ?  ?Equipment Recommendations ? Hospital bed;Wheelchair cushion (measurements OT);Wheelchair (measurements OT)  ?  ?Recommendations for Other Services   ? ?  ?Precautions / Restrictions Precautions ?Precautions: Fall ?Precaution Comments: L neglect ?Restrictions ?Weight Bearing Restrictions: No  ? ? ?  ? ?Mobility Bed Mobility ?Overal bed mobility: Needs Assistance ?Bed Mobility: Supine to Sit, Sit to Supine, Rolling ?Rolling: +2 for physical assistance, Total assist ?  ?Supine to sit: +2 for physical assistance, Total assist ?Sit to supine: +2 for physical assistance, Total assist ?  ?General bed mobility  comments: no initiation or response to cues.  When PT laid pt back down, pt had right knee bent and moved it down and up once but not to command.  Some spontaneous movement of right UE as well. ?  ? ?Transfers ?  ?  ?  ?  ?  ?  ?  ?  ?  ?  ?  ?  ?Balance Overall balance assessment: Needs assistance ?Sitting-balance support: Feet supported, Bilateral upper extremity supported ?Sitting balance-Leahy Scale: Zero ?Sitting balance - Comments: uaable to keep balance sitting on EOB without total assist. Patient unable to follow commands with one righting reaction noted ?Postural control: Left lateral lean, Posterior lean ?  ?  ?  ?  ?  ?  ?  ?  ?  ?  ?  ?  ?  ?  ?   ? ?ADL either performed or assessed with clinical judgement  ? ?ADL   ?  ?  ?  ?  ?  ?  ?  ?  ?  ?  ?  ?  ?  ?  ?  ?  ?  ?  ?  ?  ?  ? ?Extremity/Trunk Assessment Upper Extremity Assessment ?LUE Deficits / Details: flaccid, ROM WFL. no response to painful stimuli ?LUE Sensation: decreased light touch ?LUE Coordination: decreased fine motor;decreased gross motor ?  ?  ?  ?  ?  ? ?Vision   ?  ?  ?Perception   ?  ?Praxis   ?  ? ?Cognition Arousal/Alertness: Lethargic ?Behavior During Therapy: Flat affect ?Overall Cognitive Status: Impaired/Different from baseline ?Area of Impairment: Attention, Following commands, Safety/judgement ?  ?  ?  ?  ?  ?  ?  ?  ?Orientation Level:  Disoriented to, Place ?Current Attention Level: Focused ?  ?  ?Safety/Judgement: Decreased awareness of safety ?  ?Problem Solving: Slow processing, Requires verbal cues, Requires tactile cues ?General Comments: largely unresponsive to commands. ?  ?  ?   ?Exercises   ? ?  ?Shoulder Instructions   ? ? ?  ?General Comments    ? ? ?Pertinent Vitals/ Pain       Pain Assessment ?Pain Assessment: No/denies pain ?Pain Intervention(s): Monitored during session ? ?Home Living   ?  ?  ?  ?  ?  ?  ?  ?  ?  ?  ?  ?  ?  ?  ?  ?  ?  ?  ? ?  ?Prior Functioning/Environment    ?  ?  ?  ?   ? ?Frequency ? Min  2X/week  ? ? ? ? ?  ?Progress Toward Goals ? ?OT Goals(current goals can now be found in the care plan section) ? Progress towards OT goals: Not progressing toward goals - comment (due to lethargic) ? ?Acute Rehab OT Goals ?OT Goal Formulation: Patient unable to participate in goal setting ?Time For Goal Achievement: 03/02/22 ?Potential to Achieve Goals: Fair ?ADL Goals ?Pt Will Perform Grooming: with min assist;bed level ?Additional ADL Goal #1: Patient will roll to the left with Min A to increase independence with toileting. ?Additional ADL Goal #2: Patient will sit edge of bed with supervison for up to 5 min to increase independence with grooming and UB ADL  ?Plan Discharge plan needs to be updated   ? ?Co-evaluation ? ? ? PT/OT/SLP Co-Evaluation/Treatment: Yes ?Reason for Co-Treatment: Complexity of the patient's impairments (multi-system involvement);For patient/therapist safety ?PT goals addressed during session: Mobility/safety with mobility ?OT goals addressed during session: Strengthening/ROM ?  ? ?  ?AM-PAC OT "6 Clicks" Daily Activity     ?Outcome Measure ? ? Help from another person eating meals?: Total ?Help from another person taking care of personal grooming?: Total ?Help from another person toileting, which includes using toliet, bedpan, or urinal?: Total ?Help from another person bathing (including washing, rinsing, drying)?: Total ?Help from another person to put on and taking off regular upper body clothing?: Total ?Help from another person to put on and taking off regular lower body clothing?: Total ?6 Click Score: 6 ? ?  ?End of Session Equipment Utilized During Treatment: Oxygen ? ?OT Visit Diagnosis: Muscle weakness (generalized) (M62.81);Other symptoms and signs involving cognitive function;Hemiplegia and hemiparesis ?Hemiplegia - Right/Left: Left ?Hemiplegia - dominant/non-dominant: Dominant ?Hemiplegia - caused by: Nontraumatic intracerebral hemorrhage ?  ?Activity Tolerance Patient limited  by lethargy ?  ?Patient Left in bed;with call bell/phone within reach ?  ?Nurse Communication Mobility status ?  ? ?   ? ?Time: 1044-1100 ?OT Time Calculation (min): 16 min ? ?Charges: OT General Charges ?$OT Visit: 1 Visit ?OT Treatments ?$Therapeutic Activity: 8-22 mins ? ?Lodema Hong, OTA ?Acute Rehabilitation Services  ?Pager [phone] ?Office [phone] ? ? ?Trixie Dredge ?02/22/2022, 1:04 PM ?

## 2022-02-22 NOTE — Progress Notes (Incomplete)
STROKE TEAM PROGRESS NOTE   INTERVAL HISTORY  CT PE protocol: There are adherent peripheral filling defects identified primarily within the left lower lobe pulmonary artery. Additionally some lower lobe filling defects are noted on the right as well as upper lobe emboli on the right. No coronary calcifications are seen.  Day 17 post hemorrhagic stroke- 2/19    Awaiting follow up head CT, dvt study  Low dose heparin drip w/o bolus- pharmacy dosing Keep BP <130   Vitals:   02/22/22 0300 02/22/22 0335 02/22/22 0633 02/22/22 0700  BP: 110/63 110/63 (!) 143/74 (!) (P) 143/82  Pulse: 77 75 80 (P) 84  Resp: (!) 30 20 (!) 26 (!) (P) 30  Temp:  99.5 F (37.5 C)  (P) 98.5 F (36.9 C)  TempSrc:  Axillary  (P) Axillary  SpO2: 100% 97% 99% (P) 97%  Weight:      Height:       CBC:  Recent Labs  Lab 02/21/22 0407 02/22/22 0213  WBC 10.2 10.5  NEUTROABS 7.2 7.2  HGB 13.7 12.5  HCT 42.3 38.8  MCV 87.6 90.2  PLT 252 206    Basic Metabolic Panel:  Recent Labs  Lab 02/21/22 0407 02/22/22 0213  NA 134* 131*  K 5.1 4.3  CL 96* 98  CO2 25 23  GLUCOSE 190* 146*  BUN 37* 46*  CREATININE 1.16* 1.21*  CALCIUM 11.3* 10.6*    Lipid Panel:  No results for input(s): CHOL, TRIG, HDL, CHOLHDL, VLDL, LDLCALC in the last 168 hours.  HgbA1c: No results for input(s): HGBA1C in the last 168 hours. Urine Drug Screen: No results for input(s): LABOPIA, COCAINSCRNUR, LABBENZ, AMPHETMU, THCU, LABBARB in the last 168 hours.  Alcohol Level No results for input(s): ETH in the last 168 hours.  IMAGING past 24 hours CT Angio Chest Pulmonary Embolism (PE) W or WO Contrast  Result Date: 02/21/2022 CLINICAL DATA:  Positive D-dimer and difficulty breathing EXAM: CT ANGIOGRAPHY CHEST WITH CONTRAST TECHNIQUE: Multidetector CT imaging of the chest was performed using the standard protocol during bolus administration of intravenous contrast. Multiplanar CT image reconstructions and MIPs were obtained to  evaluate the vascular anatomy. RADIATION DOSE REDUCTION: This exam was performed according to the departmental dose-optimization program which includes automated exposure control, adjustment of the mA and/or kV according to patient size and/or use of iterative reconstruction technique. CONTRAST:  67mL OMNIPAQUE IOHEXOL 350 MG/ML SOLN COMPARISON:  Chest x-ray from the previous day. FINDINGS: Cardiovascular: Thoracic aorta and its branches are within normal limits. No aneurysmal dilatation or dissection is noted. Heart is at the upper limits of normal in size. Pulmonary artery shows a normal branching pattern bilaterally. There are adherent peripheral filling defects identified primarily within the left lower lobe pulmonary artery. Additionally some lower lobe filling defects are noted on the right as well as upper lobe emboli on the right. No coronary calcifications are seen. Mediastinum/Nodes: Thoracic inlet demonstrates evidence of a tracheostomy tube in satisfactory position. Thyroid is unremarkable. Feeding catheter is noted within the esophagus extending into the stomach. No sizable hilar or mediastinal adenopathy is noted. Lungs/Pleura: Lungs are well aerated bilaterally with the exception of consolidation in the lower lobes bilaterally with small effusions. No sizable parenchymal nodules are seen. No pneumothorax is identified. Upper Abdomen: Visualized upper abdomen is within normal limits. Musculoskeletal: No chest wall abnormality. No acute or significant osseous findings. Review of the MIP images confirms the above findings. IMPRESSION: Bilateral pulmonary emboli as described above without evidence of  right heart strain. Bilateral lower lobe consolidation with associated small effusions. These results will be called to the ordering clinician or representative by the Radiologist Assistant, and communication documented in the PACS or [employer]. Electronically Signed   By: Alcide Clever M.D.   On:  02/21/2022 19:48    PHYSICAL EXAM Temp:  [98.2 F (36.8 C)-100 F (37.8 C)] 100 F (37.8 C) (03/01 0800) Pulse Rate:  [53-94] 81 (03/01 0817) Resp:  [15-26] 21 (03/01 0800) BP: (103-194)/(51-99) 103/51 (03/01 0909) SpO2:  [93 %-100 %] 96 % (03/01 0800) FiO2 (%):  [30 %] 30 % (03/01 0726)   General - Well nourished, well developed, woman with tracheostomy   Cardiovascular - Regular rhythm and rate on telemetry.    Mental Status -  Patient is not following commands today, does not respond to name   Cranial Nerves II - XII - II - Pupils pinpoint and sluggish.  Right gaze deviation can be overcome III, IV, VI - Dysconjugate gaze significantly improved. Oculocephalic reflex intact XII tongue midline  Motor Strength - Patient withdraws to pain on R side in both extremities, moves RUE and RLE spontaneously and nonpurposefully.   Gait and Station - deferred.       ASSESSMENT/PLAN Sarah Myers is a 51 y.o. female with history of HTN presenting with AMS, L sided weakness, and slurred speech. Attempted extubation 2/23 and patient had post-extubation stridor leading to re-intubation. Cords non-edematous.  Re-attempted extubation 2/26, and patient failed leading to reintubation.  Tracheostomy placed 2/28, sedation weaned, now on trach collar and moved to progressive unit. Developed bil pulmonary emboli, on low dose heparin drip. Head CT follow up pending   ICH: Acute right large BG ICH likely secondary to hypertensive emergency Code Stroke CT head Positive for Acute Right Lentiform Hemorrhage tracking into the right temporal lobe. Intra-axial blood volume slightly greater than 33 mL. Mass effect with 4-5 mm leftward midline shift; superimposed chronic meningocele progressing since 2016.  Repeat CTH at 0045 with 36 mL hemorrhagic area, and 0835 repeat unchanged. CTA head & neck No large vessel occlusion, intracranial aneurysm, or abnormal vasculature associated with hemorrhage.  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 CT repeat 2/20 stable hematoma and MLS Follow-up MRI 2/21 with some extension of edema and stable ICH. Follow up CT 2/24 and 2/26 with no progression of ICH; 60mm shift on the latter 2D Echo LVEF 65-70%, small pericardial effusion present; trival MVR LDL 64 HgbA1c 5.6 VTE prophylaxis - heparin subcu No antithrombotic prior to admission, now on No antithrombotic.  Therapy recommendations:  CIR Disposition:  Pending    Cerebral edema CT head showed midline shift 5 mm;  Repeat CT stable hematoma and midline shift MRI with some extension of edema and stable ICH, intraventricular extension noted. Follow up CT 2/24 with no progression of ICH F/U CT 2/26 stable with 67mm shift. Saline 75->50->NS->off 24 hr Na stable 153 Na monitoring on daily BMP   Hypertensive emergency SBP as high as 250 on presentation Home meds:  Coreg 25 mg BID, HCTZ 25 Off of Cleviprex  PRN Labetalol 10 mg q2h for SBP >150. Re-initiated as patient has been without bradycardia. Continue Norvasc 10 mg and Clonidine 0.1mg  TID, and Coreg 12.5 mg BID Hydralazine d/c Due to inc Cr, initially d/c'd Losartan Cr 1.16 ->1.21 BP goal less than 160 Long-term BP goal normotensive   Elevated Cr/ AKI Cr 1.16-> 1.21 Losartan restarted BMP monitoring Continue TF  Elevated Troponin Downtrending 387-269; EKG with sinus rhythm, PACs, and nonspecific ST wave abnormality, possible inferiolateral ischemia versus 2/2 BP derangements and intubation.   Respiratory failure Intubated in the ED for airway protection Extubated ->re-intubated 2/23 and 2/26.   Trached 2/28 Fentanyl and versed PRN for agitation and pain On trach collar, tolerating well   Other Stroke Risk Factors Family hx stroke (Maternal Uncle)   Other Active Problems Leukocytosis: WBC downtrending 11.6-> 10.8, UA negative; Tracheal aspirate culture with moderate WBC, gram pos  cocci in pairs and clusters (S. Aureus and S. Marcescens) -> on cefepime (renally dosed by pharmacy) Pressure ulcer of L hand  Hospital Day 17  Patient seen and examined by NP/APP with MD. MD to update note as needed.   Elmer Picker, DNP, FNP-BC [facility] Pager: [phone]      To contact Stroke Continuity provider, please refer to [email or web address]. After hours, contact General Neurology

## 2022-02-22 NOTE — Progress Notes (Signed)
Physical Therapy Treatment ?Patient Details ?Name: Sarah Myers ?MRN: [id number] ?DOB: 31-Oct-1971 ?Today's Date: 02/22/2022 ? ? ?History of Present Illness 51 y.o. female presents to [facility] hospital on 02/05/2022 with L hemiplegia and dysarthria. 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 Pt intubated 2/19. Attempted extubation 2/23 and 2/26, requiring reintubation same date. Tracheostomy 02/14/2022. PMH includes uncontrolled HTN ? ?  ?PT Comments  ? ? Pt admitted with above diagnosis. Pt was able to sit EOB with total assist of 2.  Pt may have attempted to right herself once otherwise unresponsive.  Will continue to follow acutely.  Pt currently with functional limitations due to cognitive, balance and endurance deficits.  Pt will benefit from skilled PT to increase their independence and safety with mobility to allow discharge to the venue listed below.      ?Recommendations for follow up therapy are one component of a multi-disciplinary discharge planning process, led by the attending physician.  Recommendations may be updated based on patient status, additional functional criteria and insurance authorization. ? ?Follow Up Recommendations ? Skilled nursing-short term rehab (<3 hours/day) ?  ?  ?Assistance Recommended at Discharge Frequent or constant Supervision/Assistance  ?Patient can return home with the following Two people to help with walking and/or transfers;Two people to help with bathing/dressing/bathroom;Assistance with cooking/housework;Assistance with feeding;Direct supervision/assist for financial management;Direct supervision/assist for medications management;Help with stairs or ramp for entrance;Assist for transportation ?  ?Equipment Recommendations ? Wheelchair (measurements PT);Wheelchair cushion (measurements PT);Hospital bed;Other (comment)  ?  ?Recommendations for Other Services   ? ? ?  ?Precautions / Restrictions Precautions ?Precautions: Fall ?Precaution  Comments: L neglect ?Restrictions ?Weight Bearing Restrictions: No  ?  ? ?Mobility ? Bed Mobility ?Overal bed mobility: Needs Assistance ?Bed Mobility: Supine to Sit, Sit to Supine, Rolling ?Rolling: +2 for physical assistance, Total assist ?  ?Supine to sit: +2 for physical assistance, Total assist ?Sit to supine: +2 for physical assistance, Total assist ?  ?General bed mobility comments: no initiation or response to cues.  When PT laid pt back down, pt had right knee bent and moved it down and up once but not to command.  Some spontaneous movement of right UE as well. ?  ? ?Transfers ?  ?  ?  ?  ?  ?  ?  ?  ?  ?  ?  ? ?Ambulation/Gait ?  ?  ?  ?  ?  ?  ?  ?  ? ? ?Stairs ?  ?  ?  ?  ?  ? ? ?Wheelchair Mobility ?  ? ?Modified Rankin (Stroke Patients Only) ?  ? ? ?  ?Balance Overall balance assessment: Needs assistance ?Sitting-balance support: Feet supported, Bilateral upper extremity supported ?Sitting balance-Leahy Scale: Zero ?Sitting balance - Comments: Pt was unable to sit up right without total assist. She appeared to  exhibit one righting reaction one time in sitting when attempting to lean pt forward she moved her right UE appearing to try to right herself for a brief second. Her neck was flexed and her shoulders where rounded and required total assist to correct.  Attempted to engage pt at EOB however no responses other than above. ?Postural control: Left lateral lean, Posterior lean ?  ?  ?  ?  ?  ?  ?  ?  ?  ?  ?  ?  ?  ?  ?  ? ?  ?Cognition Arousal/Alertness: Lethargic ?Behavior During Therapy: Flat affect ?Overall Cognitive Status:  Impaired/Different from baseline ?Area of Impairment: Attention, Following commands, Safety/judgement ?  ?  ?  ?  ?  ?  ?  ?  ?Orientation Level: Disoriented to, Place ?Current Attention Level: Focused ?  ?Following Commands:  (Pt does not follow commands.) ?Safety/Judgement: Decreased awareness of safety ?  ?Problem Solving: Slow processing, Requires verbal cues, Requires  tactile cues ?General Comments: largely unresponsive to commands. ?  ?  ? ?  ?Exercises   ? ?  ?General Comments   ?  ?  ? ?Pertinent Vitals/Pain Pain Assessment ?Pain Assessment: No/denies pain  ? ? ?Home Living   ?  ?  ?  ?  ?  ?  ?  ?  ?  ?   ?  ?Prior Function    ?  ?  ?   ? ?PT Goals (current goals can now be found in the care plan section) Progress towards PT goals: Not progressing toward goals - comment (lethargy) ? ?  ?Frequency ? ? ? Min 3X/week ? ? ? ?  ?PT Plan Current plan remains appropriate  ? ? ?Co-evaluation PT/OT/SLP Co-Evaluation/Treatment: Yes ?Reason for Co-Treatment: Complexity of the patient's impairments (multi-system involvement);For patient/therapist safety ?PT goals addressed during session: Mobility/safety with mobility ?  ?  ? ?  ?AM-PAC PT "6 Clicks" Mobility   ?Outcome Measure ? Help needed turning from your back to your side while in a flat bed without using bedrails?: Total ?Help needed moving from lying on your back to sitting on the side of a flat bed without using bedrails?: Total ?Help needed moving to and from a bed to a chair (including a wheelchair)?: Total ?Help needed standing up from a chair using your arms (e.g., wheelchair or bedside chair)?: Total ?Help needed to walk in hospital room?: Total ?Help needed climbing 3-5 steps with a railing? : Total ?6 Click Score: 6 ? ?  ?End of Session Equipment Utilized During Treatment: Oxygen ?Activity Tolerance: Patient limited by fatigue;Patient limited by lethargy ?Patient left: in bed;with call bell/phone within reach;with bed alarm set;with restraints reapplied ?Nurse Communication: Mobility status (Pt wet and needed to be cleaned) ?PT Visit Diagnosis: Other abnormalities of gait and mobility (R26.89);Hemiplegia and hemiparesis ?Hemiplegia - Right/Left: Left ?Hemiplegia - dominant/non-dominant: Dominant ?Hemiplegia - caused by: Nontraumatic intracerebral hemorrhage ?  ? ? ?Time: 1035-1100 ?PT Time Calculation (min) (ACUTE ONLY):  25 min ? ?Charges:  $Therapeutic Activity: 8-22 mins          ?          ? ?Indio Santilli M,PT ?Acute Rehab Services ?[phone] ?[phone] (pager)  ? ? ?Bevelyn Buckles ?02/22/2022, 12:15 PM ? ?

## 2022-02-22 NOTE — Progress Notes (Signed)
Brief neurology progress note: ? ?Patient hypertensive hemorrhagic stroke which occurred February 05, 2022 with CT chest PE protocol read as: ? ?There are adherent peripheral filling defects identified primarily within the left lower lobe pulmonary artery. Additionally some lower lobe filling defects are noted on the right as well as upper lobe emboli on the right. No coronary ?calcifications are seen. ? ?She is 17 days post initial hemorrhage and the pulmonary emboli are in the pulmonary artery and at risk for extension with severe consequences.  The benefit to starting anticoagulation far outweighs the risk of bleeding extension. ? ?Recommend ?Low-dose no bolus heparin drip. ?SBP <130. ?Monitor closely/frequent neurochecks. ?Follow-up noncontrast head CT. ? ? ?Electronically signed by:  ?Marisue Humble, MD ?Page: [phone] ?02/22/2022, 1:12 AM ? ? ? ? ?

## 2022-02-22 NOTE — Progress Notes (Signed)
ANTICOAGULATION CONSULT NOTE - Follow Up Consult ? ?Pharmacy Consult for Heparin ?Indication: pulmonary embolus ? ?Allergies  ?Allergen Reactions  ? Lisinopril Cough  ? ? ?Patient Measurements: ?Height: 5\' 4"  (162.6 cm) ?Weight: 73 kg (160 lb 15 oz) ?IBW/kg (Calculated) : 54.7 ?Heparin Dosing Weight: 70 kg ? ?Vital Signs: ?Temp: 98.5 ?F (36.9 ?C) (03/08 2028) ?Temp Source: Axillary (03/08 2028) ?BP: 130/69 (03/08 2028) ?Pulse Rate: 76 (03/08 2056) ? ?Labs: ?Recent Labs  ?  02/20/22 ?04/22/22 02/21/22 ?0407 02/22/22 ?04/24/22 02/22/22 ?04/24/22 02/22/22 ?1413 02/22/22 ?2131  ?HGB 11.2* 13.7 12.5  --   --   --   ?HCT 34.0* 42.3 38.8  --   --   --   ?PLT 164 252 206  --   --   --   ?HEPARINUNFRC  --   --   --  0.30 0.26* 0.28*  ?CREATININE 0.94 1.16* 1.21*  --   --   --   ? ? ? ?Estimated Creatinine Clearance: 55 mL/min (A) (by C-G formula based on SCr of 1.21 mg/dL (H)). ? ?Assessment: ?51 year old female with new CTA positive for bilateral PE without heart strain. Of note patient recently admitted on 02/05/22 for ICH. Patient discussed with on call Neurology and started low dose heparin without bolus ~2am on 3/8. Patient not on anticoagulants prior to admission. Had been on SQ heparin for VTE prophylaxis since 02/09/22. ? ?HL came back at 0.28 tonight. We will increase rate and check level in AM.  ?  ?Goal of Therapy:  ?Heparin level 0.3-0.5 units/ml ?Monitor platelets by anticoagulation protocol: Yes ?  ?Plan:  ?Increase heparin drip 1150 units/hr. ?Daily heparin level and CBC while on heparin. ?Monitor for signs/symptoms of bleeding. ? ?02/11/22, PharmD, BCIDP, AAHIVP, CPP ?Infectious Disease Pharmacist ?02/22/2022 10:24 PM ? ? ? ?

## 2022-02-23 ENCOUNTER — Inpatient Hospital Stay (HOSPITAL_COMMUNITY): Payer: 59

## 2022-02-23 DIAGNOSIS — I82412 Acute embolism and thrombosis of left femoral vein: Secondary | ICD-10-CM

## 2022-02-23 DIAGNOSIS — I2699 Other pulmonary embolism without acute cor pulmonale: Secondary | ICD-10-CM | POA: Insufficient documentation

## 2022-02-23 DIAGNOSIS — I2609 Other pulmonary embolism with acute cor pulmonale: Secondary | ICD-10-CM

## 2022-02-23 LAB — CBC WITH DIFFERENTIAL/PLATELET
Abs Immature Granulocytes: 0.05 10*3/uL (ref 0.00–0.07)
Basophils Absolute: 0.1 10*3/uL (ref 0.0–0.1)
Basophils Relative: 1 %
Eosinophils Absolute: 0.3 10*3/uL (ref 0.0–0.5)
Eosinophils Relative: 3 %
HCT: 34 % — ABNORMAL LOW (ref 36.0–46.0)
Hemoglobin: 11.1 g/dL — ABNORMAL LOW (ref 12.0–15.0)
Immature Granulocytes: 1 %
Lymphocytes Relative: 19 %
Lymphs Abs: 1.6 10*3/uL (ref 0.7–4.0)
MCH: 29.1 pg (ref 26.0–34.0)
MCHC: 32.6 g/dL (ref 30.0–36.0)
MCV: 89 fL (ref 80.0–100.0)
Monocytes Absolute: 0.6 10*3/uL (ref 0.1–1.0)
Monocytes Relative: 7 %
Neutro Abs: 5.7 10*3/uL (ref 1.7–7.7)
Neutrophils Relative %: 69 %
Platelets: UNDETERMINED 10*3/uL (ref 150–400)
RBC: 3.82 MIL/uL — ABNORMAL LOW (ref 3.87–5.11)
RDW: 14 % (ref 11.5–15.5)
WBC: 8.2 10*3/uL (ref 4.0–10.5)
nRBC: 0 % (ref 0.0–0.2)

## 2022-02-23 LAB — BASIC METABOLIC PANEL
Anion gap: 9 (ref 5–15)
BUN: 52 mg/dL — ABNORMAL HIGH (ref 6–20)
CO2: 24 mmol/L (ref 22–32)
Calcium: 10.7 mg/dL — ABNORMAL HIGH (ref 8.9–10.3)
Chloride: 101 mmol/L (ref 98–111)
Creatinine, Ser: 1.19 mg/dL — ABNORMAL HIGH (ref 0.44–1.00)
GFR, Estimated: 56 mL/min — ABNORMAL LOW (ref >60)
Glucose, Bld: 159 mg/dL — ABNORMAL HIGH (ref 70–99)
Potassium: 5 mmol/L (ref 3.5–5.1)
Sodium: 134 mmol/L — ABNORMAL LOW (ref 135–145)

## 2022-02-23 LAB — HEPARIN LEVEL (UNFRACTIONATED)
Heparin Unfractionated: 0.22 IU/mL — ABNORMAL LOW (ref 0.30–0.70)
Heparin Unfractionated: 0.48 IU/mL (ref 0.30–0.70)

## 2022-02-23 LAB — GLUCOSE, CAPILLARY
Glucose-Capillary: 146 mg/dL — ABNORMAL HIGH (ref 70–99)
Glucose-Capillary: 174 mg/dL — ABNORMAL HIGH (ref 70–99)
Glucose-Capillary: 168 mg/dL — ABNORMAL HIGH (ref 70–99)
Glucose-Capillary: 197 mg/dL — ABNORMAL HIGH (ref 70–99)
Glucose-Capillary: 131 mg/dL — ABNORMAL HIGH (ref 70–99)
Glucose-Capillary: 142 mg/dL — ABNORMAL HIGH (ref 70–99)
Glucose-Capillary: 154 mg/dL — ABNORMAL HIGH (ref 70–99)

## 2022-02-23 MED ORDER — SODIUM CHLORIDE 0.9 % IV SOLN: INTRAVENOUS | Status: DC

## 2022-02-23 NOTE — Progress Notes (Signed)
Notified Dr. Blake Divine of patient complaining of abdominal discomfort. Bladder scan shows 368 mL. Per MD in and out cath once. Will continue to monitor.   ?

## 2022-02-23 NOTE — Progress Notes (Addendum)
STROKE TEAM PROGRESS NOTE  ? ?INTERVAL HISTORY ?Patient is seen in her room with no family at the bedside.  She has been hemodynamically stable and her neurological exam is stable.  She has had no acute events overnight. ? ?Vitals:  ? 02/23/22 0759 02/23/22 0800 02/23/22 1106 02/23/22 1155  ?BP:    123/71  ?Pulse: 77 78 83 79  ?Resp: 18 18  20   ?Temp:    98.5 ?F (36.9 ?C)  ?TempSrc:    Axillary  ?SpO2:  94%    ?Weight:      ?Height:      ? ?CBC:  ?Recent Labs  ?Lab 02/22/22 ?0213 02/23/22 ?0437  ?WBC 10.5 8.2  ?NEUTROABS 7.2 5.7  ?HGB 12.5 11.1*  ?HCT 38.8 34.0*  ?MCV 90.2 89.0  ?PLT 206 PLATELET CLUMPS NOTED ON SMEAR, UNABLE TO ESTIMATE  ? ? ?Basic Metabolic Panel:  ?Recent Labs  ?Lab 02/21/22 ?0407 02/22/22 ?0213  ?NA 134* 131*  ?K 5.1 4.3  ?CL 96* 98  ?CO2 25 23  ?GLUCOSE 190* 146*  ?BUN 37* 46*  ?CREATININE 1.16* 1.21*  ?CALCIUM 11.3* 10.6*  ? ? ?Lipid Panel:  ?No results for input(s): CHOL, TRIG, HDL, CHOLHDL, VLDL, LDLCALC in the last 168 hours. ?HgbA1c: No results for input(s): HGBA1C in the last 168 hours. ?Urine Drug Screen: No results for input(s): LABOPIA, COCAINSCRNUR, LABBENZ, AMPHETMU, THCU, LABBARB in the last 168 hours.  ?Alcohol Level No results for input(s): ETH in the last 168 hours. ? ?IMAGING past 24 hours ?No results found. ? ?PHYSICAL EXAM ? ?Temp:  [98.5 ?F (36.9 ?C)-99 ?F (37.2 ?C)] 98.5 ?F (36.9 ?C) (03/09 1155) ?Pulse Rate:  [72-94] 79 (03/09 1155) ?Resp:  [14-28] 20 (03/09 1155) ?BP: (104-136)/(55-76) 123/71 (03/09 1155) ?SpO2:  [94 %-100 %] 94 % (03/09 0800) ?FiO2 (%):  [28 %] 28 % (03/09 1106) ? ?General - Well nourished, well developed, in no apparent distress, but lethargic. ? ?Ophthalmologic - fundi not visualized due to noncooperation. ? ?Cardiovascular - Regular rhythm and rate. ? ?Neuro - Patient lying in bed, not open eyes on voice but open eyes with pain stimulation.  On trach collar, not able to answer questions but following simple commands on the right hand.  Spontaneous eye  rolling movement to both directions, pupils 27mm bilaterally, reactive to light.  Left facial droop. Tongue midline. LUE plegia and LLE mild withdraw to pain. RUE 4/5 strength RLE 4/5 withdraw to pain. Sensation and coordination not cooperative, gait not tested.  ? ?  ?  ?ASSESSMENT/PLAN ?Ms. HALEAH MAROTTI is a 51 y.o. female with history of HTN presenting with AMS, L sided weakness, and slurred speech. Attempted extubation 2/23 and patient had post-extubation stridor leading to re-intubation. Cords non-edematous.  Re-attempted extubation 2/26, and patient failed leading to reintubation.  Tracheostomy placed 2/28, sedation weaned. Add seroquel and start PT/OT.  Now tolerating trach collar well. ?  ?Bilateral PE ?Elevated D-dimer 4.83 ?CTA chest showed b/l PE without right heart strain ?On heparin IV per stroke protocol - if neuro stable, may consider to switch to DOAC tomorrow. ?LE venous Doppler pending ?Management per primary team ? 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 Intra-axial blood volume slightly greater than 33 mL. Mass effect with 4-5 mm leftward midline shift; superimposed chronic meningocele progressing since 2016.  ?Repeat CTH at 0045 with 36 mL hemorrhagic area, and [phone]  repeat unchanged. ?CTA head & neck No large vessel occlusion, intracranial aneurysm, or abnormal vasculature associated with hemorrhage. 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 ?CT repeat 2/20 stable hematoma and MLS ?Follow-up MRI 2/21 with some extension of edema and stable ICH. ?Follow up CT 2/24 and 2/26 with no progression of ICH; 29mm shift on the latter ?CT repeat 3/8 showed near resolution of right large BG hemorrhage ?2D Echo LVEF 65-70%, small pericardial effusion present; trival MVR ?LDL 64 ?HgbA1c 5.6 ?VTE prophylaxis - heparin IV  now ?Therapy recommendations:  CIR ?Disposition:  Pending  ?  ?Cerebral edema ?CT head showed midline shift 5 mm;  ?Repeat CT stable hematoma and midline shift ?MRI with some extension of edema and stable ICH, intraventricular extension noted. ?Follow up CT 2/24 with no progression of ICH ?CT 2/26 stable with 28mm shift. ?CT 3/8 expected regression of right ICH, residual hematoma in setting with 6 mm of midline shift ?Na 134->131->pending ?  ?Hypertensive emergency ?SBP as high as 250 on presentation ?Home meds:  Coreg 25 mg BID, HCTZ 25 ?Off of Cleviprex  ?Continue Norvasc 10 mg and Coreg 25 mg BID and cozaar 100 ?BP goal less than 160 ?Long-term BP goal normotensive ?  ?AKI ?Cre 0.94->1.16->1.21 ?Losartan restarted ?BMP monitoring ?Continue TF ?Management per primary team ?  ?Respiratory failure ?Intubated in the ED for airway protection ?Extubated ->re-intubated 2/23 and 2/26.   ?Trached 2/28 ?Fentanyl and versed PRN for agitation and pain ?On trach collar, tolerating well ?  ?Other Stroke Risk Factors ?Family hx stroke (Maternal Uncle) ?  ?Other Active Problems ?Leukocytosis: WBC downtrending 11.6-> 10.8->10.5, UA negative; Tracheal aspirate culture with moderate WBC, gram pos cocci in pairs and clusters (S. Aureus and S. Marcescens) -> on cefepime, vancomycin and azithromycin ?Pressure ulcer of L hand ?  ?Hospital day # 10 ?  ?[place] , MSN, AGACNP-BC ?[facility] ?See Amion for schedule and pager information ?02/23/2022 1:24 PM ?  ?ATTENDING NOTE: ?I reviewed above note and agree with the assessment and plan. Pt was seen and examined.  ? ?No family at the bedside. Pt lying in bed, on trach collar, tolerating well. Still not open eyes on voice, but able to follow simple commands on the right hand and leg. Still has left hemiplegia. Neuro unchanged. Still on heparin IV, may consider to transition to [place] tomorrow if neuro stable.  ? ?For detailed assessment and plan, please refer to above  as I have made changes wherever appropriate.  ? ?Rosalin Hawking, MD PhD ?Stroke Neurology ?02/23/2022 ?2:00 PM ? ? ? ?  ? ?To contact Stroke Continuity provider, please refer to [email or web address] ?After hours, contact General Neurology  ?

## 2022-02-23 NOTE — Progress Notes (Addendum)
[facility]                                                                               Sarah Myers, is a 51 y.o. female, DOB - [birth year], WRU:[id number] Admit date - 02/05/2022    Outpatient Primary MD for the patient is [name], MD  LOS - 18  days    Brief summary   51 y/o female came to the [facility] ER on 2/19 with left sided weakness and slurred speech. Noted to have a right ICH with brain compression present on admission. Patient was admitted to neuro ICU, received 3% saline, cleviprex, and eventually intubated on 2/19.  Patient failed extubation x2, and subsequently had a tracheostomy on 2/28, trach collar placed on 3/1, was eventually taken off vent on 3/4.   [facility] assumed care on 02/20/2022.   Assessment & Plan    Assessment and Plan:  Intracerebral hemorrhage Acute right large basal ganglion ICH probably secondary to hypertensive emergency. CT head showed acute right lentiform hemorrhage with mass effect, 4 to 5 mm leftward midline shift. Repeat CT on 2/26 shows stable hematoma. Echocardiogram showed left ventricular ejection fraction of 65 to 70%. Neurology was on board and appreciate recommendations. Repeat CT head without contrast on the morning of 3/8 showed Expected regression of right ICH.  Residual hematoma and swelling causes 6 mm of midline shift. Small left occipital cortex infarcts which have become apparent by CT. Pt is neurologically stable, no changes overnight. Plan to watch her on IV heparin for another 24 hours and switch to DOAC Tomorrow , possibly eliquis 5 mg BID.    Hypertensive emergency Bp parameters are well controlled.  She was weaned off Cleviprex. Continue with 10 mg of Norvasc, clonidine 0.1 mg 3 times prn,  Coreg 25 mg twice daily, spironolactone 43m daily and cozaar 100 mg daily. .Marland Kitchen   AKI Slight elevated creatinine  increased from 0.9 to 1.2 Check BMP today.     Elevated troponins Probably  secondary to hypertensive emergency. Demand ischemia. EKG does not show any ischemic changes    Acute respiratory failure secondary to pulmonary edema Stridor due to upper airway edema Failed extubation twice finally underwent tracheostomy on 02/14/2022 Continue with pulmonary toilet Continue with trach collar.  SLP on board with PMSV trials. CT angio of the chest showed Bilateral pulmonary emboli as described above without evidence of right heart strain. Bilateral lower lobe consolidation with associated small effusions. Pt started on IV heparin for the pulmonary emboli,.  Venous duplex ordered and done, results are pending.  She was also started on IV ancef for pneumonia.    Nutrition s/p cortrak and is on tube feeds.      Hypercalcemia:  Unclear etiology. Suspect dehydration. Will start her on IV fluids.   Malnutrition Type:  Nutrition Problem: Inadequate oral intake Etiology: inability to eat   Malnutrition Characteristics:  Signs/Symptoms: NPO status   Nutrition Interventions:  Interventions: Tube feeding, Prostat  Estimated body mass index is 27.62 kg/m as calculated from the following:   Height as of this encounter: '5\' 4"'  (1.626 m).   Weight as of this encounter: 73 kg.  Code  Status: full code.  DVT Prophylaxis:  SCD's Start: 02/05/22 0846   Level of Care: Level of care: Progressive Family Communication: None at bedside.   Disposition Plan:     Remains inpatient appropriate:  unsafe d/c plan.   Procedures:  CT angio of the chest.   Consultants:   Neurology   Antimicrobials:   Anti-infectives (From admission, onward)    Start     Dose/Rate Route Frequency Ordered Stop   02/22/22 2200  ceFAZolin (ANCEF) IVPB 2g/100 mL premix        2 g 200 mL/hr over 30 Minutes Intravenous Every 8 hours 02/22/22 1101     02/22/22 1100  vancomycin (VANCOREADY) IVPB 1250 mg/250 mL  Status:  Discontinued        1,250 mg 166.7 mL/hr over 90 Minutes Intravenous  Every 24 hours 02/21/22 1150 02/22/22 1059   02/21/22 1800  azithromycin (ZITHROMAX) 500 mg in sodium chloride 0.9 % 250 mL IVPB  Status:  Discontinued        500 mg 250 mL/hr over 60 Minutes Intravenous Every 24 hours 02/21/22 1701 02/22/22 1059   02/21/22 1000  vancomycin (VANCOREADY) IVPB 1750 mg/350 mL        1,750 mg 175 mL/hr over 120 Minutes Intravenous  Once 02/21/22 0900 02/21/22 1246   02/21/22 1000  ceFEPIme (MAXIPIME) 2 g in sodium chloride 0.9 % 100 mL IVPB  Status:  Discontinued        2 g 200 mL/hr over 30 Minutes Intravenous Every 12 hours 02/21/22 0903 02/22/22 1059   02/21/22 0945  cefTRIAXone (ROCEPHIN) 2 g in sodium chloride 0.9 % 100 mL IVPB  Status:  Discontinued        2 g 200 mL/hr over 30 Minutes Intravenous Every 24 hours 02/21/22 0850 02/21/22 0903   02/14/22 0945  ceFEPIme (MAXIPIME) 2 g in sodium chloride 0.9 % 100 mL IVPB        2 g 200 mL/hr over 30 Minutes Intravenous Every 8 hours 02/14/22 0845 02/16/22 0713   02/10/22 1400  vancomycin (VANCOCIN) IVPB 1000 mg/200 mL premix  Status:  Discontinued        1,000 mg 200 mL/hr over 60 Minutes Intravenous Every 24 hours 02/09/22 1214 02/10/22 0808   02/09/22 1300  vancomycin (VANCOREADY) IVPB 1500 mg/300 mL        1,500 mg 150 mL/hr over 120 Minutes Intravenous  Once 02/09/22 1214 02/09/22 1608   02/09/22 1245  ceFEPIme (MAXIPIME) 2 g in sodium chloride 0.9 % 100 mL IVPB  Status:  Discontinued        2 g 200 mL/hr over 30 Minutes Intravenous Every 12 hours 02/09/22 1145 02/14/22 0845        Medications  Scheduled Meds:  amLODipine  10 mg Per Tube Daily   carvedilol  25 mg Per Tube BID WC   chlorhexidine gluconate (MEDLINE KIT)  15 mL Mouth Rinse BID   Chlorhexidine Gluconate Cloth  6 each Topical Q0600   clonazePAM  1.5 mg Per Tube BID   feeding supplement (PROSource TF)  45 mL Per Tube BID   guaiFENesin  200 mg Per Tube Q6H   insulin aspart  0-15 Units Subcutaneous Q4H   losartan  100 mg Per Tube  Daily   mouth rinse  15 mL Mouth Rinse 10 times per day   multivitamin with minerals  1 tablet Per Tube Daily   nystatin  5 mL Oral QID   pantoprazole sodium  40 mg  Per Tube Daily   QUEtiapine  50 mg Per Tube QHS   sodium chloride flush  10-40 mL Intracatheter Q12H   spironolactone  50 mg Per Tube Daily   Continuous Infusions:   ceFAZolin (ANCEF) IV 2 g (02/23/22 1341)   feeding supplement (JEVITY 1.5 CAL/FIBER) 1,000 mL (02/21/22 2225)   heparin 1,300 Units/hr (02/23/22 1024)   PRN Meds:.acetaminophen **OR** acetaminophen (TYLENOL) oral liquid 160 mg/5 mL **OR** acetaminophen, cloNIDine, hydrALAZINE, labetalol, midazolam, oxyCODONE, senna-docusate, sodium chloride flush    Subjective:   Melena Calderone was seen and examined today.  Appears comfortable. Not following commands.  Objective:   Vitals:   02/23/22 0759 02/23/22 0800 02/23/22 1106 02/23/22 1155  BP:    123/71  Pulse: 77 78 83 79  Resp: '18 18  20  ' Temp:    98.5 F (36.9 C)  TempSrc:    Axillary  SpO2:  94%    Weight:      Height:        Intake/Output Summary (Last 24 hours) at 02/23/2022 1522 Last data filed at 02/23/2022 0816 Gross per 24 hour  Intake 2200.73 ml  Output 400 ml  Net 1800.73 ml    Filed Weights   02/14/22 0500 02/16/22 0329 02/21/22 0500  Weight: 76.7 kg 72.9 kg 73 kg     Exam General exam: ill appearing lady not in distress. On 5l it of oxygen s/p cortrak Respiratory system: diminished air entry, s/p trach.  Cardiovascular system: S1 & S2 heard, RRR. No JVD,no pedal edema.  Gastrointestinal system: Abdomen is nondistended, soft and nontender.Normal bowel sounds heard. Central nervous system: not following commands, just grunting to sternal rub.  Extremities: no pedal edema.  Skin: No rashes,seen Psychiatry: unable to assess   Data Reviewed:  I have personally reviewed following labs and imaging studies   CBC Lab Results  Component Value Date   WBC 8.2 02/23/2022   RBC 3.82  (L) 02/23/2022   HGB 11.1 (L) 02/23/2022   HCT 34.0 (L) 02/23/2022   MCV 89.0 02/23/2022   MCH 29.1 02/23/2022   PLT PLATELET CLUMPS NOTED ON SMEAR, UNABLE TO ESTIMATE 02/23/2022   MCHC 32.6 02/23/2022   RDW 14.0 02/23/2022   LYMPHSABS 1.6 02/23/2022   MONOABS 0.6 02/23/2022   EOSABS 0.3 02/23/2022   BASOSABS 0.1 03/54/6568     Last metabolic panel Lab Results  Component Value Date   NA 131 (L) 02/22/2022   K 4.3 02/22/2022   CL 98 02/22/2022   CO2 23 02/22/2022   BUN 46 (H) 02/22/2022   CREATININE 1.21 (H) 02/22/2022   GLUCOSE 146 (H) 02/22/2022   GFRNONAA 55 (L) 02/22/2022   GFRAA 119 08/18/2020   CALCIUM 10.6 (H) 02/22/2022   PHOS 3.1 02/08/2022   PROT 6.8 02/19/2022   ALBUMIN 2.6 (L) 02/19/2022   LABGLOB 3.5 06/29/2020   AGRATIO 1.3 06/29/2020   BILITOT 0.3 02/19/2022   ALKPHOS 54 02/19/2022   AST 34 02/19/2022   ALT 54 (H) 02/19/2022   ANIONGAP 10 02/22/2022    CBG (last 3)  Recent Labs    02/23/22 0722 02/23/22 0732 02/23/22 1155  GLUCAP 174* 168* 197*       Coagulation Profile: No results for input(s): INR, PROTIME in the last 168 hours.   Radiology Studies: CT HEAD WO CONTRAST (5MM)  Result Date: 02/22/2022 CLINICAL DATA:  Follow-up hemorrhagic stroke. Now with pulmonary emboli EXAM: CT HEAD WITHOUT CONTRAST TECHNIQUE: Contiguous axial images were obtained from the base of the  skull through the vertex without intravenous contrast. RADIATION DOSE REDUCTION: This exam was performed according to the departmental dose-optimization program which includes automated exposure control, adjustment of the mA and/or kV according to patient size and/or use of iterative reconstruction technique. COMPARISON:  02/12/2022 FINDINGS: Brain: Decreased volume and more hazy/indistinct hematoma in the deep right cerebrum at the posterior putamen and adjacent white matter, high-density measuring up to 3 cm x 2 cm on axial slices. Adjacent edema combines to cause 6 mm of  midline shift. Small left inferior occipital infarcts, newly seen by CT. No hydrocephalus or extra-axial collection. Vascular: No hyperdense vessel or unexpected calcification. Skull: Normal. Negative for fracture or focal lesion. 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 Right mastoid and middle ear opacification in the setting of nasal intubation. IMPRESSION: 1. Expected regression of right ICH. Residual hematoma and swelling causes 6 mm of midline shift. 2. Small left occipital cortex infarcts which have become apparent by CT. Electronically Signed   By: Jorje Guild M.D.   On: 02/22/2022 10:26   CT Angio Chest Pulmonary Embolism (PE) W or WO Contrast  Result Date: 02/21/2022 CLINICAL DATA:  Positive D-dimer and difficulty breathing EXAM: CT ANGIOGRAPHY CHEST WITH CONTRAST TECHNIQUE: Multidetector CT imaging of the chest was performed using the standard protocol during bolus administration of intravenous contrast. Multiplanar CT image reconstructions and MIPs were obtained to evaluate the vascular anatomy. RADIATION DOSE REDUCTION: This exam was performed according to the departmental dose-optimization program which includes automated exposure control, adjustment of the mA and/or kV according to patient size and/or use of iterative reconstruction technique. CONTRAST:  54m OMNIPAQUE IOHEXOL 350 MG/ML SOLN COMPARISON:  Chest x-ray from the previous day. FINDINGS: Cardiovascular: Thoracic aorta and its branches are within normal limits. No aneurysmal dilatation or dissection is noted. Heart is at the upper limits of normal in size. Pulmonary artery shows a normal branching pattern bilaterally. There are adherent peripheral filling defects identified primarily within the left lower lobe pulmonary artery. Additionally some lower lobe filling defects are noted on the right as well as upper lobe emboli on the right. No coronary calcifications are seen.  Mediastinum/Nodes: Thoracic inlet demonstrates evidence of a tracheostomy tube in satisfactory position. Thyroid is unremarkable. Feeding catheter is noted within the esophagus extending into the stomach. No sizable hilar or mediastinal adenopathy is noted. Lungs/Pleura: Lungs are well aerated bilaterally with the exception of consolidation in the lower lobes bilaterally with small effusions. No sizable parenchymal nodules are seen. No pneumothorax is identified. Upper Abdomen: Visualized upper abdomen is within normal limits. Musculoskeletal: No chest wall abnormality. No acute or significant osseous findings. Review of the MIP images confirms the above findings. IMPRESSION: Bilateral pulmonary emboli as described above without evidence of right heart strain. Bilateral lower lobe consolidation with associated small effusions. These results will be called to the ordering clinician or representative by the Radiologist Assistant, and communication documented in the PACS or [employer] Electronically Signed   By: MInez CatalinaM.D.   On: 02/21/2022 19:48       VHosie PoissonM.D. [facility] 02/23/2022, 3:22 PM  Available via Epic secure chat 7am-7pm After 7 pm, please refer to night coverage provider listed on amion.

## 2022-02-23 NOTE — Progress Notes (Signed)
ANTICOAGULATION CONSULT NOTE ? ?Pharmacy Consult for heparin ?Indication: bilateral PE ? ?Allergies  ?Allergen Reactions  ? Lisinopril Cough  ? ? ?Patient Measurements: ?Height: 5\' 4"  (162.6 cm) ?Weight: 73 kg (160 lb 15 oz) ?IBW/kg (Calculated) : 54.7 ?Heparin Dosing Weight: 70.3 kg ? ?Vital Signs: ?Temp: 98.5 ?F (36.9 ?C) (03/09 1155) ?Temp Source: Axillary (03/09 1155) ?BP: 129/72 (03/09 1523) ?Pulse Rate: 81 (03/09 1523) ? ?Labs: ?Recent Labs  ?  02/21/22 ?0407 02/22/22 ?0213 02/22/22 ?0738 02/22/22 ?1413 02/22/22 ?2131 02/23/22 ?0437  ?HGB 13.7 12.5  --   --   --  11.1*  ?HCT 42.3 38.8  --   --   --  34.0*  ?PLT 252 206  --   --   --  PLATELET CLUMPS NOTED ON SMEAR, UNABLE TO ESTIMATE  ?HEPARINUNFRC  --   --    < > 0.26* 0.28* 0.22*  ?CREATININE 1.16* 1.21*  --   --   --   --   ? < > = values in this interval not displayed.  ? ? ?Estimated Creatinine Clearance: 55 mL/min (A) (by C-G formula based on SCr of 1.21 mg/dL (H)). ? ?Medical History: ?Past Medical History:  ?Diagnosis Date  ? Hypertension   ? ? ?Medications:  ?Infusions:  ?  ceFAZolin (ANCEF) IV 2 g (02/23/22 1341)  ? feeding supplement (JEVITY 1.5 CAL/FIBER) 1,000 mL (02/21/22 2225)  ? heparin 1,300 Units/hr (02/23/22 1024)  ? ? ?Assessment: ?84 YOF with bilateral PE on CTA without RHS. Of note, patient was initially admitted 2/19 with ICH - now stable on follow-up imaging. No anticoagulation PTA. Pharmacy consulted to dose heparin - low dose no bolus per Neurology.  ? ?Heparin level 0.48 is therapeutic after rate increase to 1300 units/hr. CBC stable - Hgb 11.1, platelets 206 on 3/08. No infusion problems noted, level drawn appropriately.  ? ?Goal of Therapy:  ?Heparin level 0.3-0.5 units/ml ?Monitor platelets by anticoagulation protocol: Yes ?  ?Plan:  ?Continue heparin infusion at 1300 units/hr ?Daily CBC, heparin level ?Monitor for s/sx of bleeding ?F/U possible transition to DOAC 3/10 ? ?5/10, PharmD ?PGY1 Pharmacy Resident ?02/23/2022  4:42  PM ? ?Please check [email or web address] for unit-specific pharmacy phone numbers. ? ?

## 2022-02-23 NOTE — Progress Notes (Signed)
VASCULAR LAB ? ? ? ?Bilateral lower extremity venous duplex has been performed. ? ?See CV proc for preliminary results. ? ? ?Emani Morad, RVT ?02/23/2022, 12:30 PM ? ?

## 2022-02-23 NOTE — Progress Notes (Signed)
ANTICOAGULATION CONSULT NOTE - Follow Up Consult ? ?Pharmacy Consult for Heparin ?Indication: pulmonary embolus ? ?Allergies  ?Allergen Reactions  ? Lisinopril Cough  ? ? ?Patient Measurements: ?Height: 5\' 4"  (162.6 cm) ?Weight: 73 kg (160 lb 15 oz) ?IBW/kg (Calculated) : 54.7 ?Heparin Dosing Weight: 70 kg ? ?Vital Signs: ?Temp: (P) 98.5 ?F (36.9 ?C) (03/09 03-27-1980) ?Temp Source: (P) Axillary (03/09 0724) ?BP: (P) 135/76 (03/09 0724) ?Pulse Rate: 77 (03/09 0759) ? ?Labs: ?Recent Labs  ?  02/21/22 ?0407 02/22/22 ?0213 02/22/22 ?0738 02/22/22 ?1413 02/22/22 ?2131 02/23/22 ?0437  ?HGB 13.7 12.5  --   --   --  11.1*  ?HCT 42.3 38.8  --   --   --  34.0*  ?PLT 252 206  --   --   --  PLATELET CLUMPS NOTED ON SMEAR, UNABLE TO ESTIMATE  ?HEPARINUNFRC  --   --    < > 0.26* 0.28* 0.22*  ?CREATININE 1.16* 1.21*  --   --   --   --   ? < > = values in this interval not displayed.  ? ? ? ?Estimated Creatinine Clearance: 55 mL/min (A) (by C-G formula based on SCr of 1.21 mg/dL (H)). ? ?Assessment: ?51 year old female with new CTA  positive for bilateral PE without heart strain on 02/21/22. Of note patient recently admitted on 02/05/22 for ICH. Patient discussed with on call Neurology and started low dose heparin without bolus ~2am on 3/8. Patient not on anticoagulants prior to admission. Had been on SQ heparin for VTE prophylaxis since 02/09/22. ? ? Heparin level is subtherapeutic (0.22) on 1150 units/hr. Level lower than previous, despite increased infusion rate last night.  No known infusion problems.  Hgb trended down some, platelets clumped today, 206K yesterday. No bleeding reported.  Noted 3/8 CT showed near resolution of right large BG hemorrhage.  ? ?Goal of Therapy:  ?Heparin level 0.3-0.5 units/ml ?Monitor platelets by anticoagulation protocol: Yes ?  ?Plan:  ?Increase heparin drip to 1300 units/hr. ?Heparin level ~ 6 hrs after rate change. ?Daily heparin level and CBC while on heparin. ?Monitor for signs/symptoms of  bleeding. ? ?5/8, RPh ?02/23/2022,9:34 AM ? ? ? ?

## 2022-02-24 ENCOUNTER — Inpatient Hospital Stay (HOSPITAL_COMMUNITY): Payer: 59

## 2022-02-24 ENCOUNTER — Other Ambulatory Visit (HOSPITAL_COMMUNITY): Payer: Self-pay

## 2022-02-24 DIAGNOSIS — R042 Hemoptysis: Secondary | ICD-10-CM

## 2022-02-24 DIAGNOSIS — I2694 Multiple subsegmental pulmonary emboli without acute cor pulmonale: Secondary | ICD-10-CM

## 2022-02-24 LAB — CBC
HCT: 32.6 % — ABNORMAL LOW (ref 36.0–46.0)
Hemoglobin: 10.6 g/dL — ABNORMAL LOW (ref 12.0–15.0)
MCH: 29.1 pg (ref 26.0–34.0)
MCHC: 32.5 g/dL (ref 30.0–36.0)
MCV: 89.6 fL (ref 80.0–100.0)
Platelets: 203 10*3/uL (ref 150–400)
RBC: 3.64 MIL/uL — ABNORMAL LOW (ref 3.87–5.11)
RDW: 13.8 % (ref 11.5–15.5)
WBC: 6.7 10*3/uL (ref 4.0–10.5)
nRBC: 0 % (ref 0.0–0.2)

## 2022-02-24 LAB — BASIC METABOLIC PANEL
Anion gap: 9 (ref 5–15)
BUN: 50 mg/dL — ABNORMAL HIGH (ref 6–20)
CO2: 23 mmol/L (ref 22–32)
Calcium: 10.3 mg/dL (ref 8.9–10.3)
Chloride: 101 mmol/L (ref 98–111)
Creatinine, Ser: 1.07 mg/dL — ABNORMAL HIGH (ref 0.44–1.00)
GFR, Estimated: >60 mL/min (ref >60)
Glucose, Bld: 152 mg/dL — ABNORMAL HIGH (ref 70–99)
Potassium: 4.4 mmol/L (ref 3.5–5.1)
Sodium: 133 mmol/L — ABNORMAL LOW (ref 135–145)

## 2022-02-24 LAB — GLUCOSE, CAPILLARY
Glucose-Capillary: 162 mg/dL — ABNORMAL HIGH (ref 70–99)
Glucose-Capillary: 140 mg/dL — ABNORMAL HIGH (ref 70–99)
Glucose-Capillary: 142 mg/dL — ABNORMAL HIGH (ref 70–99)
Glucose-Capillary: 133 mg/dL — ABNORMAL HIGH (ref 70–99)
Glucose-Capillary: 115 mg/dL — ABNORMAL HIGH (ref 70–99)
Glucose-Capillary: 143 mg/dL — ABNORMAL HIGH (ref 70–99)

## 2022-02-24 LAB — HEPARIN LEVEL (UNFRACTIONATED)
Heparin Unfractionated: 0.55 IU/mL (ref 0.30–0.70)
Heparin Unfractionated: 0.28 IU/mL — ABNORMAL LOW (ref 0.30–0.70)

## 2022-02-24 MED ORDER — FREE WATER
200.0000 mL | Freq: Four times a day (QID) | Status: DC
  Administered 2022-02-24 – 2022-02-26 (×8): 200 mL

## 2022-02-24 MED ORDER — DEXTROMETHORPHAN POLISTIREX ER 30 MG/5ML PO SUER
30.0000 mg | Freq: Two times a day (BID) | ORAL | Status: AC
  Administered 2022-02-24 – 2022-02-27 (×8): 30 mg
  Filled 2022-02-24 (×8): qty 5

## 2022-02-24 NOTE — TOC Initial Note (Signed)
Transition of Care (TOC) - Initial/Assessment Note  ? ? ?Patient Details  ?Name: Sarah Myers ?MRN: [id number] ?Date of Birth: 1971/06/18 ? ?Transition of Care [facility]) CM/SW Contact:    ?Baldemar Lenis, LCSW ?Phone Number: ?02/24/2022, 10:46 AM ? ?Clinical Narrative:        CSW following for SNF placement.  CSW received email from financial counselor with incompetency letter, obtained MD signature, and sent back to financial counseling to assist with Medicaid and Disability applications. Medicaid application has been submitted this morning, Disability referral sent to [facility], application for disability should be submitted within a week. Patient will not qualify for SNF placement until after disability application is pending. Patient also continues with cuffed trach and cortrak, which are barriers to SNF placement. CSW to follow.        ? ? ?Expected Discharge Plan: Skilled Nursing Facility ?Barriers to Discharge: Continued Medical Work up, Inadequate or no insurance ? ? ?Patient Goals and CMS Choice ?Patient states their goals for this hospitalization and ongoing recovery are:: patient unable to participate in goal setting, not oriented ?CMS Medicare.gov Compare Post Acute Care list provided to:: Patient Represenative (must comment) ?Choice offered to / list presented to : Adult Children ? ?Expected Discharge Plan and Services ?Expected Discharge Plan: Skilled Nursing Facility ?  ?  ?Post Acute Care Choice: Skilled Nursing Facility ?Living arrangements for the past 2 months: Apartment ?                ?  ?  ?  ?  ?  ?  ?  ?  ?  ?  ? ?Prior Living Arrangements/Services ?Living arrangements for the past 2 months: Apartment ?Lives with:: Self ?Patient language and need for interpreter reviewed:: No ?Do you feel safe going back to the place where you live?: Yes      ?Need for Family Participation in Patient Care: Yes (Comment) ?Care giver support system in place?: No (comment) ?  ?Criminal Activity/Legal  Involvement Pertinent to Current Situation/Hospitalization: No - Comment as needed ? ?Activities of Daily Living ?Home Assistive Devices/Equipment: None ?ADL Screening (condition at time of admission) ?Patient's cognitive ability adequate to safely complete daily activities?: Yes ?Is the patient deaf or have difficulty hearing?: No ?Does the patient have difficulty seeing, even when wearing glasses/contacts?: No ?Does the patient have difficulty concentrating, remembering, or making decisions?: No ?Patient able to express need for assistance with ADLs?: No ?Does the patient have difficulty dressing or bathing?: No ?Independently performs ADLs?: Yes (appropriate for developmental age) ?Does the patient have difficulty walking or climbing stairs?: No ?Weakness of Legs: None ?Weakness of Arms/Hands: None ? ?Permission Sought/Granted ?Permission sought to share information with : Facility Medical sales representative, Family Supports ?Permission granted to share information with : Yes, Verbal Permission Granted ? Share Information with NAME: Onalee Hua ? Permission granted to share info w AGENCY: SNF ? Permission granted to share info w Relationship: Daughters ?   ? ?Emotional Assessment ?  ?Attitude/Demeanor/Rapport: Unable to Assess ?Affect (typically observed): Unable to Assess ?Orientation: : Oriented to Self ?Alcohol / Substance Use: Not Applicable ?Psych Involvement: No (comment) ? ?Admission diagnosis:  ICH (intracerebral hemorrhage) (HCC) [I61.9] ?Patient Active Problem List  ? Diagnosis Date Noted  ? Pulmonary embolus (HCC)   ? Oliguria   ? AKI (acute kidney injury) (HCC)   ? Acute respiratory failure (HCC)   ? Pressure injury of skin 02/07/2022  ? ICH (intracerebral hemorrhage) (HCC) 02/05/2022  ? Cerebral edema (HCC)   ?  Brain compression (HCC)   ? Iron deficiency anemia 06/30/2020  ? Vitamin D deficiency 06/30/2020  ? GAD (generalized anxiety disorder) 05/18/2020  ? 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 ?PCP:  [name], MD ?Pharmacy:   ?[facility] ?[address], [address] ?[place] [address] ?Phone: [phone] Fax: [phone] ? ?[facility] - [place] ([place] - [address] ?[address] ?[place] [address] ?Phone: [phone] Fax: [phone] ? ? ? ? ?Social Determinants of Health (SDOH) Interventions ?  ? ?Readmission Risk Interventions ?No flowsheet data found. ? ? ?

## 2022-02-24 NOTE — Progress Notes (Signed)
[facility]                                                                               [name], is a 51 y.o. female, DOB - 05-25-1971, KXH:[id number] Admit date - 02/05/2022    Outpatient Primary MD for the patient is [name], MD  LOS - 19  days    Brief summary   51 y/o female came to the [facility] ER on 2/19 with left sided weakness and slurred speech. Noted to have a right ICH with brain compression present on admission. Patient was admitted to neuro ICU, received 3% saline, cleviprex, and eventually intubated on 2/19.  Patient failed extubation x2, and subsequently had a tracheostomy on 2/28, trach collar placed on 3/1, was eventually taken off vent on 3/4.   [facility] assumed care on 02/20/2022.   Assessment & Plan    Assessment and Plan:  Intracerebral hemorrhage Acute right large basal ganglion ICH probably secondary to hypertensive emergency. CT head showed acute right lentiform hemorrhage with mass effect, 4 to 5 mm leftward midline shift. Repeat CT on 2/26 shows stable hematoma. Echocardiogram showed left ventricular ejection fraction of 65 to 70%. Neurology was on board and appreciate recommendations. Repeat CT head without contrast on the morning of 3/8 showed Expected regression of right ICH.  Residual hematoma and swelling causes 6 mm of midline shift. Small left occipital cortex infarcts which have become apparent by CT. No changes in neuro exam in the last 48 hours.     Hypertensive emergency Bp parameters are well controlled.  She was weaned off Cleviprex. Continue with 10 mg of Norvasc, clonidine 0.1 mg 3 times prn,  Coreg 25 mg twice daily, spironolactone 50mg  daily and cozaar 100 mg daily. .    AKI Improvement in creatinine with IV fluids.  Suspect from dehydration.  Added free water to the tube feeds.     Elevated troponins Probably secondary to hypertensive emergency. Demand ischemia. EKG does not show any  ischemic changes    Acute respiratory failure secondary to pulmonary edema Stridor due to upper airway edema Failed extubation twice finally underwent tracheostomy on 02/14/2022 Continue with pulmonary toilet Continue with trach collar.  SLP on board with PMSV trials. CT angio of the chest showed Bilateral pulmonary emboli as described above without evidence of right heart strain. Bilateral lower lobe consolidation with associated small effusions. Pt started on IV heparin for the pulmonary emboli,.  Venous duplex ordered and done, results are pending.  She was also started on IV ancef for MSSA pneumonia.  Pt had hemoptysis from trach, blood tinged secretions, pt not in any distress.  She remains on IV heparin . PLAN TO transition to oral eliquis tomorrow, if hemoptysis does not worsen.    Nutrition s/p cortrak and is on tube feeds.    Urinary retention:  S/p In and out yesterday.      Hypercalcemia:  Improved with IV FLUIDS.   Malnutrition Type:  Nutrition Problem: Inadequate oral intake Etiology: inability to eat   Malnutrition Characteristics:  Signs/Symptoms: NPO status   Nutrition Interventions:  Interventions: Tube feeding, Prostat  Estimated body mass index is 28.12 kg/m  as calculated from the following:   Height as of this encounter: 5\' 4"  (1.626 m).   Weight as of this encounter: 74.3 kg.  Code Status: full code.  DVT Prophylaxis:  SCD's Start: 02/05/22 0846   Level of Care: Level of care: Progressive Family Communication: daughter at bedside.   Disposition Plan:     Remains inpatient appropriate:  unsafe d/c plan.   Procedures:  CT angio of the chest.   Consultants:   Neurology   Antimicrobials:   Anti-infectives (From admission, onward)    Start     Dose/Rate Route Frequency Ordered Stop   02/22/22 2200  ceFAZolin (ANCEF) IVPB 2g/100 mL premix        2 g 200 mL/hr over 30 Minutes Intravenous Every 8 hours 02/22/22 1101     02/22/22  1100  vancomycin (VANCOREADY) IVPB 1250 mg/250 mL  Status:  Discontinued        1,250 mg 166.7 mL/hr over 90 Minutes Intravenous Every 24 hours 02/21/22 1150 02/22/22 1059   02/21/22 1800  azithromycin (ZITHROMAX) 500 mg in sodium chloride 0.9 % 250 mL IVPB  Status:  Discontinued        500 mg 250 mL/hr over 60 Minutes Intravenous Every 24 hours 02/21/22 1701 02/22/22 1059   02/21/22 1000  vancomycin (VANCOREADY) IVPB 1750 mg/350 mL        1,750 mg 175 mL/hr over 120 Minutes Intravenous  Once 02/21/22 0900 02/21/22 1246   02/21/22 1000  ceFEPIme (MAXIPIME) 2 g in sodium chloride 0.9 % 100 mL IVPB  Status:  Discontinued        2 g 200 mL/hr over 30 Minutes Intravenous Every 12 hours 02/21/22 0903 02/22/22 1059   02/21/22 0945  cefTRIAXone (ROCEPHIN) 2 g in sodium chloride 0.9 % 100 mL IVPB  Status:  Discontinued        2 g 200 mL/hr over 30 Minutes Intravenous Every 24 hours 02/21/22 0850 02/21/22 0903   02/14/22 0945  ceFEPIme (MAXIPIME) 2 g in sodium chloride 0.9 % 100 mL IVPB        2 g 200 mL/hr over 30 Minutes Intravenous Every 8 hours 02/14/22 0845 02/16/22 0713   02/10/22 1400  vancomycin (VANCOCIN) IVPB 1000 mg/200 mL premix  Status:  Discontinued        1,000 mg 200 mL/hr over 60 Minutes Intravenous Every 24 hours 02/09/22 1214 02/10/22 0808   02/09/22 1300  vancomycin (VANCOREADY) IVPB 1500 mg/300 mL        1,500 mg 150 mL/hr over 120 Minutes Intravenous  Once 02/09/22 1214 02/09/22 1608   02/09/22 1245  ceFEPIme (MAXIPIME) 2 g in sodium chloride 0.9 % 100 mL IVPB  Status:  Discontinued        2 g 200 mL/hr over 30 Minutes Intravenous Every 12 hours 02/09/22 1145 02/14/22 0845        Medications  Scheduled Meds:  amLODipine  10 mg Per Tube Daily   carvedilol  25 mg Per Tube BID WC   chlorhexidine gluconate (MEDLINE KIT)  15 mL Mouth Rinse BID   Chlorhexidine Gluconate Cloth  6 each Topical Q0600   clonazePAM  1.5 mg Per Tube BID   dextromethorphan  30 mg Per Tube BID    feeding supplement (PROSource TF)  45 mL Per Tube BID   free water  200 mL Per Tube Q6H   guaiFENesin  200 mg Per Tube Q6H   insulin aspart  0-15 Units Subcutaneous Q4H  losartan  100 mg Per Tube Daily   mouth rinse  15 mL Mouth Rinse 10 times per day   multivitamin with minerals  1 tablet Per Tube Daily   nystatin  5 mL Oral QID   pantoprazole sodium  40 mg Per Tube Daily   QUEtiapine  50 mg Per Tube QHS   spironolactone  50 mg Per Tube Daily   Continuous Infusions:   ceFAZolin (ANCEF) IV 200 mL/hr at 02/24/22 1323   feeding supplement (JEVITY 1.5 CAL/FIBER) 1,000 mL (02/21/22 2225)   heparin 1,250 Units/hr (02/24/22 1323)   PRN Meds:.acetaminophen **OR** acetaminophen (TYLENOL) oral liquid 160 mg/5 mL **OR** acetaminophen, cloNIDine, hydrALAZINE, labetalol, midazolam, oxyCODONE, senna-docusate    Subjective:   Sarah Myers was seen and examined today.  Coughing up blood tinged secretions from the trach.  Objective:   Vitals:   02/24/22 0750 02/24/22 0810 02/24/22 1134 02/24/22 1151  BP:  134/65  (!) 130/105  Pulse: 79 77 76 77  Resp: 20 20 (!) 23 20  Temp:  98.1 F (36.7 C)  98.1 F (36.7 C)  TempSrc:  Axillary  Oral  SpO2: 100% 99% 100% 97%  Weight:      Height:        Intake/Output Summary (Last 24 hours) at 02/24/2022 1418 Last data filed at 02/24/2022 1323 Gross per 24 hour  Intake 2094.17 ml  Output 500 ml  Net 1594.17 ml    Filed Weights   02/16/22 0329 02/21/22 0500 02/24/22 0408  Weight: 72.9 kg 73 kg 74.3 kg     Exam General exam: ill appearing lady s/p cortrak and trach.  Respiratory system: diminished air entry throughout the lungs.  Cardiovascular system: S1 & S2 heard, RRR. No pedal edema. Gastrointestinal system: Abdomen is nondistended, soft and nontender. Normal bowel sounds heard. Central nervous system: not following commands.  Extremities: Symmetric 5 x 5 power. Skin: No rashes, lesions or ulcers Psychiatry: unable to assess.     Data Reviewed:  I have personally reviewed following labs and imaging studies   CBC Lab Results  Component Value Date   WBC 6.7 02/24/2022   RBC 3.64 (L) 02/24/2022   HGB 10.6 (L) 02/24/2022   HCT 32.6 (L) 02/24/2022   MCV 89.6 02/24/2022   MCH 29.1 02/24/2022   PLT 203 02/24/2022   MCHC 32.5 02/24/2022   RDW 13.8 02/24/2022   LYMPHSABS 1.6 02/23/2022   MONOABS 0.6 02/23/2022   EOSABS 0.3 02/23/2022   BASOSABS 0.1 20/35/5974     Last metabolic panel Lab Results  Component Value Date   NA 133 (L) 02/24/2022   K 4.4 02/24/2022   CL 101 02/24/2022   CO2 23 02/24/2022   BUN 50 (H) 02/24/2022   CREATININE 1.07 (H) 02/24/2022   GLUCOSE 152 (H) 02/24/2022   GFRNONAA >60 02/24/2022   GFRAA 119 08/18/2020   CALCIUM 10.3 02/24/2022   PHOS 3.1 02/08/2022   PROT 6.8 02/19/2022   ALBUMIN 2.6 (L) 02/19/2022   LABGLOB 3.5 06/29/2020   AGRATIO 1.3 06/29/2020   BILITOT 0.3 02/19/2022   ALKPHOS 54 02/19/2022   AST 34 02/19/2022   ALT 54 (H) 02/19/2022   ANIONGAP 9 02/24/2022    CBG (last 3)  Recent Labs    02/24/22 0437 02/24/22 0816 02/24/22 1157  GLUCAP 162* 140* 142*       Coagulation Profile: No results for input(s): INR, PROTIME in the last 168 hours.   Radiology Studies: DG CHEST PORT 1 VIEW  Result  Date: 02/24/2022 CLINICAL DATA:  Hemoptysis. EXAM: PORTABLE CHEST 1 VIEW COMPARISON:  February 20, 2022. FINDINGS: Stable cardiomegaly. Tracheostomy and feeding tubes are unchanged in position. Both lungs are clear. The visualized skeletal structures are unremarkable. IMPRESSION: Stable support apparatus. No acute cardiopulmonary abnormality is seen. Electronically Signed   By: Marijo Conception M.D.   On: 02/24/2022 11:08   VAS [place] LOWER EXTREMITY VENOUS (DVT)  Result Date: 02/23/2022  Lower Venous DVT Study Patient Name:  TYLIE GOLONKA  Date of Exam:   02/23/2022 Medical Rec #: [id number]            Accession #:    [id number] Date of Birth: [birth year]              Patient Gender: F Patient Age:   47 years Exam Location:  [facility] Procedure:      VAS [place] LOWER EXTREMITY VENOUS (DVT) Referring Phys: Cornelius Moras XU --------------------------------------------------------------------------------  Indications: Pulmonary embolism.  Comparison Study: No previous exams Performing Technologist: Sharion Dove RVS  Examination Guidelines: A complete evaluation includes B-mode imaging, spectral Doppler, color Doppler, and power Doppler as needed of all accessible portions of each vessel. Bilateral testing is considered an integral part of a complete examination. Limited examinations for reoccurring indications may be performed as noted. The reflux portion of the exam is performed with the patient in reverse Trendelenburg.  +---------+---------------+---------+-----------+----------+--------------+  RIGHT     Compressibility Phasicity Spontaneity Properties Thrombus Aging  +---------+---------------+---------+-----------+----------+--------------+  CFV       Full            Yes       Yes                                    +---------+---------------+---------+-----------+----------+--------------+  SFJ       Full                                                             +---------+---------------+---------+-----------+----------+--------------+  FV Prox   Full                                                             +---------+---------------+---------+-----------+----------+--------------+  FV Mid    Full                                                             +---------+---------------+---------+-----------+----------+--------------+  FV Distal Full                                                             +---------+---------------+---------+-----------+----------+--------------+  PFV       Full                                                             +---------+---------------+---------+-----------+----------+--------------+  POP       Full                                                              +---------+---------------+---------+-----------+----------+--------------+  PTV       Full                                                             +---------+---------------+---------+-----------+----------+--------------+  PERO      Full                                                             +---------+---------------+---------+-----------+----------+--------------+   +---------+---------------+---------+-----------+----------+--------------+  LEFT      Compressibility Phasicity Spontaneity Properties Thrombus Aging  +---------+---------------+---------+-----------+----------+--------------+  CFV       Full            Yes       Yes                                    +---------+---------------+---------+-----------+----------+--------------+  SFJ       Full                                                             +---------+---------------+---------+-----------+----------+--------------+  FV Prox   Full                                                             +---------+---------------+---------+-----------+----------+--------------+  FV Mid    Full                                                             +---------+---------------+---------+-----------+----------+--------------+  FV Distal Full                                                             +---------+---------------+---------+-----------+----------+--------------+  PFV       Full                                                             +---------+---------------+---------+-----------+----------+--------------+  POP       Full                                                             +---------+---------------+---------+-----------+----------+--------------+  PTV       Full                                                             +---------+---------------+---------+-----------+----------+--------------+  PERO      Full                                                              +---------+---------------+---------+-----------+----------+--------------+     Summary: BILATERAL: - No evidence of deep vein thrombosis seen in the lower extremities, bilaterally. - No evidence of superficial venous thrombosis in the lower extremities, bilaterally. -No evidence of popliteal cyst, bilaterally.   *See table(s) above for measurements and observations. Electronically signed by Jamelle Haring on 02/23/2022 at 8:55:10 PM.    Final        Hosie Poisson M.D. [facility] 02/24/2022, 2:18 PM  Available via Epic secure chat 7am-7pm After 7 pm, please refer to night coverage provider listed on amion.

## 2022-02-24 NOTE — Progress Notes (Signed)
Physical Therapy Treatment ?Patient Details ?Name: Sarah Myers ?MRN: [id number] ?DOB: [birth year] ?Today's Date: 02/24/2022 ? ? ?History of Present Illness 51 y.o. female presents to [facility] on 02/05/2022 with L hemiplegia and dysarthria. 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 Pt intubated 2/19. Attempted extubation 2/23 and 2/26, requiring reintubation same date. Tracheostomy 02/14/2022. PMH includes uncontrolled HTN ? ?  ?PT Comments  ? ? Pt remains severely impaired with all functional mobility due to poor balance, decr cognition, and decr strength in lt side. Pt with eyes close almost all of session. Pt moving rt side spontaneously and followed 2 commands. Don't feel eye closing necessarily indicative of lethargy. Also nodded/shook head a few times appropriately to questions. Continue to recommend SNF at dc for further rehab.    ?Recommendations for follow up therapy are one component of a multi-disciplinary discharge planning process, led by the attending physician.  Recommendations may be updated based on patient status, additional functional criteria and insurance authorization. ? ?Follow Up Recommendations ? Skilled nursing-short term rehab (<3 hours/day) ?  ?  ?Assistance Recommended at Discharge Frequent or constant Supervision/Assistance  ?Patient can return home with the following Two people to help with walking and/or transfers;Two people to help with bathing/dressing/bathroom;Assistance with cooking/housework;Assistance with feeding;Direct supervision/assist for financial management;Direct supervision/assist for medications management;Help with stairs or ramp for entrance;Assist for transportation ?  ?Equipment Recommendations ? Wheelchair (measurements PT);Wheelchair cushion (measurements PT);Hospital bed;Other (comment) (hoyer lift)  ?  ?Recommendations for Other Services   ? ? ?  ?Precautions / Restrictions Precautions ?Precautions: Fall ?Precaution  Comments: L neglect ?Restrictions ?Weight Bearing Restrictions: No  ?  ? ?Mobility ? Bed Mobility ?Overal bed mobility: Needs Assistance ?Bed Mobility: Supine to Sit, Sit to Supine, Rolling ?Rolling: +2 for physical assistance, Total assist ?  ?Supine to sit: +2 for physical assistance, Total assist ?Sit to supine: +2 for physical assistance, Total assist ?  ?General bed mobility comments: Assist for all aspects ?  ? ?Transfers ?  ?  ?  ?  ?  ?  ?  ?  ?  ?  ?  ? ?Ambulation/Gait ?  ?  ?  ?  ?  ?  ?  ?  ? ? ?Stairs ?  ?  ?  ?  ?  ? ? ?Wheelchair Mobility ?  ? ?Modified Rankin (Stroke Patients Only) ?  ? ? ?  ?Balance Overall balance assessment: Needs assistance ?Sitting-balance support: Feet supported, Bilateral upper extremity supported ?Sitting balance-Leahy Scale: Zero ?Sitting balance - Comments: Max assist to sit EOB x 10 minutes ?Postural control: Left lateral lean, Posterior lean ?  ?  ?  ?  ?  ?  ?  ?  ?  ?  ?  ?  ?  ?  ?  ? ?  ?Cognition Arousal/Alertness: Awake/alert (Eyes closed for 98% of treatment but pt moving rt side spontaneously and nodding/shaking head at times. Do not feel like lack of eye opening is due to lethargy.) ?Behavior During Therapy: Flat affect ?Overall Cognitive Status: Difficult to assess ?  ?  ?  ?  ?  ?  ?  ?  ?  ?  ?  ?  ?  ?  ?  ?  ?General Comments: Followed 2 - 1 step commands with rt side. ?  ?  ? ?  ?Exercises   ? ?  ?General Comments   ?  ?  ? ?Pertinent Vitals/Pain Pain Assessment ?Breathing: normal ?  Negative Vocalization: none ?Facial Expression: smiling or inexpressive ?Body Language: relaxed ?Consolability: no need to console ?PAINAD Score: 0  ? ? ?Home Living   ?  ?  ?  ?  ?  ?  ?  ?  ?  ?   ?  ?Prior Function    ?  ?  ?   ? ?PT Goals (current goals can now be found in the care plan section) Progress towards PT goals: Not progressing toward goals - comment ? ?  ?Frequency ? ? ? Min 3X/week ? ? ? ?  ?PT Plan Current plan remains appropriate  ? ? ?Co-evaluation   ?  ?  ?  ?   ? ?  ?AM-PAC PT "6 Clicks" Mobility   ?Outcome Measure ? Help needed turning from your back to your side while in a flat bed without using bedrails?: Total ?Help needed moving from lying on your back to sitting on the side of a flat bed without using bedrails?: Total ?Help needed moving to and from a bed to a chair (including a wheelchair)?: Total ?Help needed standing up from a chair using your arms (e.g., wheelchair or bedside chair)?: Total ?Help needed to walk in hospital room?: Total ?Help needed climbing 3-5 steps with a railing? : Total ?6 Click Score: 6 ? ?  ?End of Session Equipment Utilized During Treatment: Oxygen ?Activity Tolerance: Patient limited by fatigue;Other (comment) (cognition) ?Patient left: in bed;with call bell/phone within reach;with bed alarm set;with family/visitor present ?Nurse Communication: Other (comment) (needs new purewick) ?PT Visit Diagnosis: Other abnormalities of gait and mobility (R26.89);Hemiplegia and hemiparesis ?Hemiplegia - Right/Left: Left ?Hemiplegia - dominant/non-dominant: Dominant ?Hemiplegia - caused by: Nontraumatic intracerebral hemorrhage ?  ? ? ?Time: 7782-4235 ?PT Time Calculation (min) (ACUTE ONLY): 29 min ? ?Charges:  $Therapeutic Activity: 8-22 mins          ?          ? ?[facility] PT ?Acute Rehabilitation Services ?Pager [phone] ?Office [phone] ? ? ? ?[name] [facility] ?02/24/2022, 4:50 PM ? ?

## 2022-02-24 NOTE — Progress Notes (Signed)
STROKE TEAM PROGRESS NOTE  ? ?INTERVAL HISTORY ?Patient is seen in her room with no family at the bedside. She is more lethargic than yesterday, still moving right side spontaneously but did not following commands with me. Noted that she had mild bleeding from trach site, likely trauma from suctioning and coughing. Continue heparin IV for a day, if no more bleeding, will switch heparin IV to eliquis.  ? ?Vitals:  ? 02/24/22 0750 02/24/22 0810 02/24/22 1134 02/24/22 1151  ?BP:  134/65  (!) 130/105  ?Pulse: 79 77 76 77  ?Resp: 20 20 (!) 23 20  ?Temp:  98.1 ?F (36.7 ?C)  98.1 ?F (36.7 ?C)  ?TempSrc:  Axillary  Oral  ?SpO2: 100% 99% 100% 97%  ?Weight:      ?Height:      ? ?CBC:  ?Recent Labs  ?Lab 02/22/22 ?0213 02/23/22 ?0437 02/24/22 ?0329  ?WBC 10.5 8.2 6.7  ?NEUTROABS 7.2 5.7  --   ?HGB 12.5 11.1* 10.6*  ?HCT 38.8 34.0* 32.6*  ?MCV 90.2 89.0 89.6  ?PLT 206 PLATELET CLUMPS NOTED ON SMEAR, UNABLE TO ESTIMATE 203  ? ?Basic Metabolic Panel:  ?Recent Labs  ?Lab 02/23/22 ?1620 02/24/22 ?0329  ?NA 134* 133*  ?K 5.0 4.4  ?CL 101 101  ?CO2 24 23  ?GLUCOSE 159* 152*  ?BUN 52* 50*  ?CREATININE 1.19* 1.07*  ?CALCIUM 10.7* 10.3  ? ?Lipid Panel:  ?No results for input(s): CHOL, TRIG, HDL, CHOLHDL, VLDL, LDLCALC in the last 168 hours. ?HgbA1c: No results for input(s): HGBA1C in the last 168 hours. ?Urine Drug Screen: No results for input(s): LABOPIA, COCAINSCRNUR, LABBENZ, AMPHETMU, THCU, LABBARB in the last 168 hours.  ?Alcohol Level No results for input(s): ETH in the last 168 hours. ? ?IMAGING past 24 hours ?DG CHEST PORT 1 VIEW ? ?Result Date: 02/24/2022 ?CLINICAL DATA:  Hemoptysis. EXAM: PORTABLE CHEST 1 VIEW COMPARISON:  February 20, 2022. FINDINGS: Stable cardiomegaly. Tracheostomy and feeding tubes are unchanged in position. Both lungs are clear. The visualized skeletal structures are unremarkable. IMPRESSION: Stable support apparatus. No acute cardiopulmonary abnormality is seen. Electronically Signed   By: Lupita Raider  M.D.   On: 02/24/2022 11:08   ? ?PHYSICAL EXAM ? ?Temp:  [98 ?F (36.7 ?C)-98.4 ?F (36.9 ?C)] 98.1 ?F (36.7 ?C) (03/10 1151) ?Pulse Rate:  [70-79] 77 (03/10 1151) ?Resp:  [16-26] 20 (03/10 1151) ?BP: (109-134)/(61-105) 130/105 (03/10 1151) ?SpO2:  [96 %-100 %] 97 % (03/10 1151) ?FiO2 (%):  [28 %] 28 % (03/10 1134) ?Weight:  [74.3 kg] 74.3 kg (03/10 0408) ? ?General - Well nourished, well developed, in no apparent distress, but lethargic. ? ?Ophthalmologic - fundi not visualized due to noncooperation. ? ?Cardiovascular - Regular rhythm and rate. ? ?Neuro - Patient lying in bed, not open eyes on voice but did open eyes with pain stimulation.  On trach collar, not able to answer questions, did not follow simple commands today likely due to lethargy.  With forced eye opening, spontaneous eye rolling movement to both directions, pupils 12mm bilaterally, reactive to light.  Left facial droop. Tongue midline. LUE plegia and LLE mild withdraw to pain. RUE and RLE spontaneous movement against gravity. Sensation and coordination not cooperative, gait not tested.  ? ?  ?  ?ASSESSMENT/PLAN ?Ms. DEETTA SIEGMANN is a 51 y.o. female with history of HTN presenting with AMS, L sided weakness, and slurred speech. Attempted extubation 2/23 and patient had post-extubation stridor leading to re-intubation. Cords non-edematous.  Re-attempted extubation 2/26, and  patient failed leading to reintubation.  Tracheostomy placed 2/28, sedation weaned. Add seroquel and start PT/OT.  Now tolerating trach collar well. ?  ?Bilateral PE ?Elevated D-dimer 4.83 ?CTA chest showed b/l PE without right heart strain ?On heparin IV per stroke protocol - may consider to switch to DOAC tomorrow if neuro stable and trach site bleeding improves ?LE venous Dopple no DVT ?Management per primary team ? 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 Intra-axial blood volume slightly greater than 33 mL. Mass effect with 4-5 mm leftward midline shift; superimposed chronic meningocele progressing since 2016.  ?Repeat CTH at 0045 with 36 mL hemorrhagic area, and 0835 repeat unchanged. ?CTA head & neck No large vessel occlusion, intracranial aneurysm, or abnormal vasculature associated with hemorrhage. 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 ?CT repeat 2/20 stable hematoma and MLS ?Follow-up MRI 2/21 with some extension of edema and stable ICH. ?Follow up CT 2/24 and 2/26 with no progression of ICH; 44mm shift on the latter ?CT repeat 3/8 showed near resolution of right large BG hemorrhage ?2D Echo LVEF 65-70%, small pericardial effusion present; trival MVR ?LDL 64 ?HgbA1c 5.6 ?VTE prophylaxis - heparin IV now ?Therapy recommendations:  SNF ?Disposition:  Pending  ?  ?Cerebral edema ?CT head showed midline shift 5 mm;  ?Repeat CT stable hematoma and midline shift ?MRI with some extension of edema and stable ICH, intraventricular extension noted. ?Follow up CT 2/24 with no progression of ICH ?CT 2/26 stable with 76mm shift. ?CT 3/8 expected regression of right ICH, residual hematoma in setting with 6 mm of midline shift ?  ?Hypertensive emergency ?SBP as high as 250 on presentation ?Home meds:  Coreg 25 mg BID, HCTZ 25 ?Off of Cleviprex  ?Continue Norvasc 10 mg and Coreg 25 mg BID and cozaar 100 ?BP goal less than 160 ?Long-term BP goal normotensive ?  ?AKI ?Cre 0.94->1.16->1.21->1.07 ?Losartan restarted ?BMP monitoring ?Continue TF ?Management per primary team ?  ?Respiratory failure ?Intubated in the ED for airway protection ?Extubated ->re-intubated 2/23 and 2/26.   ?Trached 2/28 ?Fentanyl and versed PRN for agitation and pain ?On trach collar, tolerating well ?  ?Leukocytosis ?WBC downtrending 11.6-> 10.8->10.5->6.7 ?UA negative ?sputum culture moderate WBC, gram pos cocci in pairs and clusters (S.  Aureus and S. Marcescens) ?off cefepime, vancomycin and azithromycin->on ancef now ? ?Other Stroke Risk Factors ?Family hx stroke (Maternal Uncle) ?  ?Other Active Problems ?Trach site bleeding - traumatic ?Pressure ulcer of L hand ?  ?Hospital day # 10 ?   ? ?Marvel Plan, MD PhD ?Stroke Neurology ?02/24/2022 ?4:01 PM ? ? ? ?  ? ?To contact Stroke Continuity provider, please refer to [email or web address]. ?After hours, contact General Neurology  ?

## 2022-02-24 NOTE — Progress Notes (Signed)
ANTICOAGULATION CONSULT NOTE - Follow Up Consult ? ?Pharmacy Consult for Heparin ?Indication: pulmonary embolus ? ?Allergies  ?Allergen Reactions  ? Lisinopril Cough  ? ? ?Patient Measurements: ?Height: 5\' 4"  (162.6 cm) ?Weight: 74.3 kg (163 lb 12.8 oz) ?IBW/kg (Calculated) : 54.7 ?Heparin Dosing Weight: 70 kg ? ?Vital Signs: ?Temp: 98 ?F (36.7 ?C) (03/10 1643) ?Temp Source: Axillary (03/10 1643) ?BP: 144/82 (03/10 1643) ?Pulse Rate: 77 (03/10 1643) ? ?Labs: ?Recent Labs  ?  02/22/22 ?0213 02/22/22 ?0738 02/23/22 ?0437 02/23/22 ?1620 02/24/22 ?0329 02/24/22 ?1549  ?HGB 12.5  --  11.1*  --  10.6*  --   ?HCT 38.8  --  34.0*  --  32.6*  --   ?PLT 206  --  PLATELET CLUMPS NOTED ON SMEAR, UNABLE TO ESTIMATE  --  203  --   ?HEPARINUNFRC  --    < > 0.22* 0.48 0.55 0.28*  ?CREATININE 1.21*  --   --  1.19* 1.07*  --   ? < > = values in this interval not displayed.  ? ? ? ?Estimated Creatinine Clearance: 62.8 mL/min (A) (by C-G formula based on SCr of 1.07 mg/dL (H)). ? ?Assessment: ?55 YOF with bilateral PE on CTA without RHS. Of note, patient was initially admitted 2/19 with ICH - now stable on follow-up imaging. No anticoagulation PTA. Pharmacy consulted to dose heparin - low dose and no bolus per Neurology.  ? ? Heparin level 0.55 on 1300 units/hr. Just above low therapeutic goal. Hgb trended down some, platelet count stable. No bleeding reported.  Possible transition to DOAC today. ? ?Heparin level back slightly subtherapeutic this PM. Plan to transition to apixaban tomorrow.  ? ?Goal of Therapy:  ?Heparin level 0.3-0.5 units/ml ?Monitor platelets by anticoagulation protocol: Yes ?  ?Plan:  ?Increase heparin drip to 13000 units/hr. ?Daily heparin level and CBC while on heparin. ?Monitor for signs/symptoms of bleeding. ?Follow up oral anticoagulation plans. ? ? ?3/19, PharmD, BCIDP, AAHIVP, CPP ?Infectious Disease Pharmacist ?02/24/2022 4:57 PM ? ? ? ? ?

## 2022-02-24 NOTE — Progress Notes (Signed)
? ?NAME:  Sarah Myers, MRN:  [id number], DOB:  23-Oct-1971, LOS: 19 ?ADMISSION DATE:  02/05/2022, CONSULTATION DATE:  2/19 ?REFERRING MD:  Selina Cooley - neuro, CHIEF COMPLAINT:  Left sided weakness, slurred speech  ? ?History of Present Illness:  ?51 y/o female admitted to [facility] on 2/19 with left sided weakness and slurred speech.  Work up consistent with  a right ICH with "brain compression" present on admission.  Required intubation for airway protection, 3% saline, & cleviprex.  Hospital course complicated by prolonged respiratory failure with failed extubations due to stridor requiring tracheostomy (2/28).  ? ?Pertinent  Medical History  ?Hypertension ? ?Significant Hospital Events: ?Including procedures, antibiotic start and stop dates in addition to other pertinent events   ?2/19 admitted to neuro with acute R ICH. Intubated for airway protection. Started on 3% and  cleviprex. PCCM consult. NSGY consult  ?02/06/2022 For repeat CT Head >> Overnight CT showed increase in size of bleed to 36 ml , but unchanged midline shift at 4 mm . Repeating CT head again 2/20 am. Neurosurgery is aware ?2/21 3% saline stopped due to Na 158. weaned off cleviprex. Remains intubated. ?2/22 neuro exam much improved (moves RUE and RLE to command; thumbs up; blinks eyes to command; sticks tongue out); sinus pause overnight w/ inferiolateral ST elevation overnight; troponin 387 then 269 ?2/23 failed extubation due to stridor. Reintubated ?2/24 Diuresis ?2/26 failed 2nd attempt at extubation. CT head ICH without rebleeding. Hematoma and edema causes 4 mm of midline shift. 2. Bilateral cribriform plate cephalocele. ?2/28 tracheostomy by Kendrick Fries, stopped versed infusion ?3/1 trach collar x1 hour ?3/2 worked with PT ?3/5 Off vent 24 hours  ?3/10 Mild hemoptysis from trach on heparin infusion ? ?Interim History / Subjective:  ?PCCM called to see for bloody sputum  ?Pt with mild hemoptysis - bloody secretions noted in suction cath and on  patients chest (pt able to cough and clear secretions) ?Remains on heparin infusion for PE  ? ?Objective   ?Blood pressure 134/65, pulse 77, temperature 98.1 ?F (36.7 ?C), temperature source Axillary, resp. rate 20, height 5\' 4"  (1.626 m), weight 74.3 kg, SpO2 99 %. ?   ?FiO2 (%):  [28 %] 28 %  ? ?Intake/Output Summary (Last 24 hours) at 02/24/2022 1022 ?Last data filed at 02/23/2022 2000 ?Gross per 24 hour  ?Intake 403.57 ml  ?Output 500 ml  ?Net -96.43 ml  ? ?Filed Weights  ? 02/16/22 0329 02/21/22 0500 02/24/22 0408  ?Weight: 72.9 kg 73 kg 74.3 kg  ? ? ?Examination: ?General: ill appearing adult female lying in bed in NAD, daughter at bedside  ?HEENT: MM pink/moist, trach in place, trach collar, anicteric, small bore feeding tube in place  ?Neuro: no response to verbal stimuli, turned to the left side, spontaneous movements on right ?CV: s1s2 RRR, no m/r/g ?PULM: non-labored at rest, lungs bilaterally diminished but clear  ?GI: soft, bsx4 active  ?Extremities: warm/dry, dependent edema on left side  ?Skin: no rashes or lesions ? ?Resolved Hospital Problem list   ?MSSA/Serratia pneumonia, fever on 2/28 due to pneumonia  ?AKI   ?Iatrogenic hypernatremia from hypertonic saline given for brain swelling  ?Agitated Delirium  ?Hypertensive Emergency  ?Acute Cardiogenic Pulmonary Edema  ?Stridor due to upper airway edema, failed extubation x 2 ? ?Assessment & Plan:  ?  ?Right temporal hematoma with brain compression   ?Acute Encephalopathy in setting of Stroke  ?-per [facility] / Neurology  ? ?Hemoptysis  ?In setting of PE on anticoagulation, likely  component of suction trauma ?-add BID delsym for 8 doses for cough suppression  ?-minimize depth of insertion catheter with tracheal suctioning  ?-follow Hgb trend ?-current volume would not warrant stopping anticoagulation for PE ?-CXR reviewed > slight rotation, no significant PNA on portable machine view at bedside ?-if hemoptysis volume increased or change in Hgb/hemodynamics,  could consider IVC filter, not warranted at this time  ? ?Acute Hypoxemic Respiratory Failure s/p Tracheostomy  ?Pulmonary Embolism  ?Trach placed 2/28  ?-trach care per protocol  ?-clip trach sutures  ?-patient will need to have trach changed to cuffless prior to discharge to SNF/rehab ?-continue heparin infusion ? ? ?Best Practice (right click and "Reselect all SmartList Selections" daily)  ?Diet/type: tubefeeds ?DVT prophylaxis: systemic heparin ?GI prophylaxis: PPI ?Lines: Central line and yes and it is still needed ?Foley:  N/A ?Code Status:  full code ?Last date of multidisciplinary goals of care discussion - [name] updated bedside 3/10 on plan of care. ? ?PCCM will continue to follow weekly. Please call sooner if new needs arise.  ? ?Critical care time: n/a minutes ?  ? ?Canary Brim, MSN, APRN, NP-C, AGACNP-BC ?[facility] ?02/24/2022, 10:22 AM ? ? ?Please see [email or web address] for pager details.  ? ?From 7A-7P if no response, please call [phone] ?After hours, please call Pola Corn [phone] ? ? ? ? ? ?

## 2022-02-24 NOTE — Progress Notes (Signed)
ANTICOAGULATION CONSULT NOTE - Follow Up Consult ? ?Pharmacy Consult for Heparin ?Indication: pulmonary embolus ? ?Allergies  ?Allergen Reactions  ? Lisinopril Cough  ? ? ?Patient Measurements: ?Height: 5\' 4"  (162.6 cm) ?Weight: 74.3 kg (163 lb 12.8 oz) ?IBW/kg (Calculated) : 54.7 ?Heparin Dosing Weight: 70 kg ? ?Vital Signs: ?Temp: 98.1 ?F (36.7 ?C) (03/10 0810) ?Temp Source: Axillary (03/10 0810) ?BP: 134/65 (03/10 0810) ?Pulse Rate: 77 (03/10 0810) ? ?Labs: ?Recent Labs  ?  02/22/22 ?0213 02/22/22 ?0738 02/23/22 ?0437 02/23/22 ?1620 02/24/22 ?0329  ?HGB 12.5  --  11.1*  --  10.6*  ?HCT 38.8  --  34.0*  --  32.6*  ?PLT 206  --  PLATELET CLUMPS NOTED ON SMEAR, UNABLE TO ESTIMATE  --  203  ?HEPARINUNFRC  --    < > 0.22* 0.48 0.55  ?CREATININE 1.21*  --   --  1.19* 1.07*  ? < > = values in this interval not displayed.  ? ? ? ?Estimated Creatinine Clearance: 62.8 mL/min (A) (by C-G formula based on SCr of 1.07 mg/dL (H)). ? ?Assessment: ?11 YOF with bilateral PE on CTA without RHS. Of note, patient was initially admitted 2/19 with ICH - now stable on follow-up imaging. No anticoagulation PTA. Pharmacy consulted to dose heparin - low dose and no bolus per Neurology.  ? ? Heparin level 0.55 on 1300 units/hr. Just above low therapeutic goal. Hgb trended down some, platelet count stable. No bleeding reported.  Possible transition to [place] today. ? ?Goal of Therapy:  ?Heparin level 0.3-0.5 units/ml ?Monitor platelets by anticoagulation protocol: Yes ?  ?Plan:  ?Decrease heparin drip to 1250 units/hr. ?Heparin level ~ 6 hrs after rate change. ?Daily heparin level and CBC while on heparin. ?Monitor for signs/symptoms of bleeding. ?Follow up oral anticoagulation plans. ? ?Arty Baumgartner, RPh ?02/24/2022,8:54 AM ? ? ? ?

## 2022-02-25 LAB — GLUCOSE, CAPILLARY
Glucose-Capillary: 137 mg/dL — ABNORMAL HIGH (ref 70–99)
Glucose-Capillary: 159 mg/dL — ABNORMAL HIGH (ref 70–99)
Glucose-Capillary: 157 mg/dL — ABNORMAL HIGH (ref 70–99)
Glucose-Capillary: 125 mg/dL — ABNORMAL HIGH (ref 70–99)
Glucose-Capillary: 173 mg/dL — ABNORMAL HIGH (ref 70–99)
Glucose-Capillary: 153 mg/dL — ABNORMAL HIGH (ref 70–99)

## 2022-02-25 LAB — CBC
HCT: 34.9 % — ABNORMAL LOW (ref 36.0–46.0)
Hemoglobin: 11.1 g/dL — ABNORMAL LOW (ref 12.0–15.0)
MCH: 28.2 pg (ref 26.0–34.0)
MCHC: 31.8 g/dL (ref 30.0–36.0)
MCV: 88.8 fL (ref 80.0–100.0)
Platelets: 208 10*3/uL (ref 150–400)
RBC: 3.93 MIL/uL (ref 3.87–5.11)
RDW: 13.7 % (ref 11.5–15.5)
WBC: 5.9 10*3/uL (ref 4.0–10.5)
nRBC: 0 % (ref 0.0–0.2)

## 2022-02-25 LAB — BASIC METABOLIC PANEL
Anion gap: 7 (ref 5–15)
BUN: 33 mg/dL — ABNORMAL HIGH (ref 6–20)
CO2: 24 mmol/L (ref 22–32)
Calcium: 11.1 mg/dL — ABNORMAL HIGH (ref 8.9–10.3)
Chloride: 104 mmol/L (ref 98–111)
Creatinine, Ser: 0.93 mg/dL (ref 0.44–1.00)
GFR, Estimated: >60 mL/min (ref >60)
Glucose, Bld: 157 mg/dL — ABNORMAL HIGH (ref 70–99)
Potassium: 4.5 mmol/L (ref 3.5–5.1)
Sodium: 135 mmol/L (ref 135–145)

## 2022-02-25 LAB — HEPARIN LEVEL (UNFRACTIONATED): Heparin Unfractionated: 0.45 IU/mL (ref 0.30–0.70)

## 2022-02-25 MED ORDER — APIXABAN 5 MG PO TABS
10.0000 mg | ORAL_TABLET | Freq: Two times a day (BID) | ORAL | Status: DC
Start: 2022-02-25 — End: 2022-02-26
  Administered 2022-02-25 – 2022-02-26 (×3): 10 mg
  Filled 2022-02-25 (×3): qty 2

## 2022-02-25 MED ORDER — APIXABAN 5 MG PO TABS: 10.0000 mg | ORAL_TABLET | Freq: Two times a day (BID) | ORAL | Status: DC

## 2022-02-25 MED ORDER — APIXABAN 5 MG PO TABS: 5.0000 mg | ORAL_TABLET | Freq: Two times a day (BID) | ORAL | Status: DC

## 2022-02-25 NOTE — Progress Notes (Addendum)
STROKE TEAM PROGRESS NOTE  ? ?INTERVAL HISTORY ?Patient is seen in her room with no family at the bedside.  ? ?More alert today but still lethargic. Following commands. switch heparin to eliquis today. ? ?Vitals:  ? 02/25/22 0446 02/25/22 0537 02/25/22 0726 02/25/22 0857  ?BP:   126/73   ?Pulse: 72  68 76  ?Resp: 14  20 (!) 22  ?Temp:   98 ?F (36.7 ?C)   ?TempSrc:   Axillary   ?SpO2:    97%  ?Weight:  74.5 kg    ?Height:      ? ?CBC:  ?Recent Labs  ?Lab 02/22/22 ?0213 02/23/22 ?0437 02/24/22 ?0329 02/25/22 ?0121  ?WBC 10.5 8.2 6.7 5.9  ?NEUTROABS 7.2 5.7  --   --   ?HGB 12.5 11.1* 10.6* 11.1*  ?HCT 38.8 34.0* 32.6* 34.9*  ?MCV 90.2 89.0 89.6 88.8  ?PLT 206 PLATELET CLUMPS NOTED ON SMEAR, UNABLE TO ESTIMATE 203 208  ? ? ?Basic Metabolic Panel:  ?Recent Labs  ?Lab 02/24/22 ?0329 02/25/22 ?0121  ?NA 133* 135  ?K 4.4 4.5  ?CL 101 104  ?CO2 23 24  ?GLUCOSE 152* 157*  ?BUN 50* 33*  ?CREATININE 1.07* 0.93  ?CALCIUM 10.3 11.1*  ? ? ?Lipid Panel:  ?No results for input(s): CHOL, TRIG, HDL, CHOLHDL, VLDL, LDLCALC in the last 168 hours. ?HgbA1c: No results for input(s): HGBA1C in the last 168 hours. ?Urine Drug Screen: No results for input(s): LABOPIA, COCAINSCRNUR, LABBENZ, AMPHETMU, THCU, LABBARB in the last 168 hours.  ?Alcohol Level No results for input(s): ETH in the last 168 hours. ? ?IMAGING past 24 hours ?No results found. ? ?PHYSICAL EXAM ? ?Temp:  [98 ?F (36.7 ?C)-98.3 ?F (36.8 ?C)] 98 ?F (36.7 ?C) (03/11 0726) ?Pulse Rate:  [68-77] 76 (03/11 0857) ?Resp:  [14-22] 22 (03/11 0857) ?BP: (106-144)/(73-87) 126/73 (03/11 0726) ?SpO2:  [96 %-98 %] 97 % (03/11 0857) ?FiO2 (%):  [28 %] 28 % (03/11 0857) ?Weight:  [74.5 kg] 74.5 kg (03/11 0537) ? ?General - Well nourished, well developed, in no apparent distress, but lethargic. ? ?Ophthalmologic - fundi not visualized due to noncooperation. ? ?Cardiovascular - Regular rhythm and rate. ? ?Neuro - Patient lying in bed, will open eyes on voice. Appears lethargic but will follow  commands such close/open eyes. Stick tongue out and moving right UE and LE to command. Did not answer yes/no questions.  On trach collar.  PERRL 54mm. Left facial droop. Tongue midline.  ? ?LUE plegia and LLE mild withdraw to pain. RUE and RLE will move briefly to antigravity. Sensation and coordination not cooperative, gait not tested.  ? ?  ?  ?ASSESSMENT/PLAN ?Ms. ZUNAIRA SORDEN is a 51 y.o. female with history of HTN presenting with AMS, L sided weakness, and slurred speech. Attempted extubation 2/23 and patient had post-extubation stridor leading to re-intubation. Cords non-edematous.  Re-attempted extubation 2/26, and patient failed leading to reintubation.  Tracheostomy placed 2/28, sedation weaned.on seroquel and  PT/OT.  Now tolerating trach collar well. Will transition to eliquis today, discussed with pharmacy.  ?  ?Bilateral PE ?Elevated D-dimer 4.83 ?CTA chest showed b/l PE without right heart strain ?On heparin IV per stroke protocol -  switch to eliquis today. ?LE venous Dopple no DVT ?Management per primary team ? 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 Intra-axial blood volume slightly greater than 33 mL. Mass  effect with 4-5 mm leftward midline shift; superimposed chronic meningocele progressing since 2016.  ?Repeat CTH at 0045 with 36 mL hemorrhagic area, and 0835 repeat unchanged. ?CTA head & neck No large vessel occlusion, intracranial aneurysm, or abnormal vasculature associated with hemorrhage. 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 ?CT repeat 2/20 stable hematoma and MLS ?Follow-up MRI 2/21 with some extension of edema and stable ICH. ?Follow up CT 2/24 and 2/26 with no progression of ICH; 18mm shift on the latter ?CT repeat 3/8 showed near resolution of right large BG hemorrhage ?2D Echo LVEF 65-70%, small  pericardial effusion present; trival MVR ?LDL 64 ?HgbA1c 5.6 ?VTE prophylaxis - heparin IV now ?Therapy recommendations:  SNF ?Disposition:  Pending  ?  ?Cerebral edema ?CT head showed midline shift 5 mm;  ?Repeat CT stable hematoma and midline shift ?MRI with some extension of edema and stable ICH, intraventricular extension noted. ?Follow up CT 2/24 with no progression of ICH ?CT 2/26 stable with 83mm shift. ?CT 3/8 expected regression of right ICH, residual hematoma in setting with 6 mm of midline shift ?  ?Hypertensive emergency ?SBP as high as 250 on presentation ?Home meds:  Coreg 25 mg BID, HCTZ 25 ?Off of Cleviprex  ?Continue Norvasc 10 mg and Coreg 25 mg BID and cozaar 100 ?BP goal less than 160 ?Long-term BP goal normotensive ?  ?AKI ?Cre 0.94->1.16->1.21->1.07 ?Losartan restarted ?BMP monitoring ?Continue TF ?Management per primary team ?  ?Respiratory failure ?Intubated in the ED for airway protection ?Extubated ->re-intubated 2/23 and 2/26.   ?Trached 2/28 ?Fentanyl and versed PRN for agitation and pain ?On trach collar, tolerating well ?  ?Leukocytosis ?WBC downtrending 11.6-> 10.8->10.5->6.7>5.9 ?UA negative ?sputum culture moderate WBC, gram pos cocci in pairs and clusters (S. Aureus and S. Marcescens) ?off cefepime, vancomycin and azithromycin->on ancef now ? ?Other Stroke Risk Factors ?Family hx stroke (Maternal Uncle) ?  ?Other Active Problems ?Trach site bleeding - traumatic ?Pressure ulcer of L hand ?  ?Hospital day # 10 ?   ?Dameion Briles,MD  ? ?  ? ?To contact Stroke Continuity provider, please refer to [email or web address] ?After hours, contact General Neurology  ?

## 2022-02-25 NOTE — Progress Notes (Addendum)
ANTICOAGULATION CONSULT NOTE - Follow Up Consult ? ?Pharmacy Consult for Heparin ?Indication: pulmonary embolus ? ?Allergies  ?Allergen Reactions  ? Lisinopril Cough  ? ? ?Patient Measurements: ?Height: 5\' 4"  (162.6 cm) ?Weight: 74.5 kg (164 lb 3.9 oz) ?IBW/kg (Calculated) : 54.7 ?Heparin Dosing Weight: 70 kg ? ?Vital Signs: ?Temp: 98.3 ?F (36.8 ?C) (03/11 0305) ?Temp Source: Axillary (03/11 0305) ?BP: 106/87 (03/11 0305) ?Pulse Rate: 72 (03/11 0446) ? ?Labs: ?Recent Labs  ?  02/23/22 ?0437 02/23/22 ?1620 02/24/22 ?0329 02/24/22 ?1549 02/25/22 ?0121  ?HGB 11.1*  --  10.6*  --  11.1*  ?HCT 34.0*  --  32.6*  --  34.9*  ?PLT PLATELET CLUMPS NOTED ON SMEAR, UNABLE TO ESTIMATE  --  203  --  208  ?HEPARINUNFRC 0.22* 0.48 0.55 0.28* 0.45  ?CREATININE  --  1.19* 1.07*  --  0.93  ? ? ? ?Estimated Creatinine Clearance: 72.3 mL/min (by C-G formula based on SCr of 0.93 mg/dL). ? ?Assessment: ?44 YOF with bilateral PE on CTA without RHS. Of note, patient was initially admitted 2/19 with ICH - now stable on follow-up imaging. No anticoagulation PTA. Pharmacy consulted to dose heparin - low dose and no bolus per Neurology.  ? ?Heparin level therapeutic at 0.45 on 1300 units/hr. Hgb trended up, platelet count stable. No bleeding reported.  Possible transition to DOAC today. ? ?Heparin level back slightly subtherapeutic this PM. Plan to transition to apixaban today.  ? ?Goal of Therapy:  ?Heparin level 0.3-0.5 units/ml ?Monitor platelets by anticoagulation protocol: Yes ?  ?Plan:  ?Addendum:  ?Discontinue heparin gtt @ 1300 units/hr per neurology  ?Start treatment dose apixaban at 10 mg BID for 7 days followed by 5 mg BID ?Daily CBC ?Monitor for signs/symptoms of bleeding. ? ?3/19, PharmD, MBA ?Pharmacy Resident ?[phone] ?02/25/2022 7:40 AM ? ? ? ? ?

## 2022-02-25 NOTE — Progress Notes (Signed)
[facility]                                                                               Sarah Myers, is a 51 y.o. female, DOB - 1971-08-05, JHE:[id number] Admit date - 02/05/2022    Outpatient Primary MD for the patient is [name], MD  LOS - 20  days    Brief summary   51 y/o female came to the [facility] ER on 2/19 with left sided weakness and slurred speech. Noted to have a right ICH with brain compression present on admission. Patient was admitted to neuro ICU, received 3% saline, cleviprex, and eventually intubated on 2/19.  Patient failed extubation x2, and subsequently had a tracheostomy on 2/28, trach collar placed on 3/1, was eventually taken off vent on 3/4.   [facility] assumed care on 02/20/2022.   Assessment & Plan    Assessment and Plan:  Intracerebral hemorrhage Acute right large basal ganglion ICH probably secondary to hypertensive emergency. CT head showed acute right lentiform hemorrhage with mass effect, 4 to 5 mm leftward midline shift. Repeat CT on 2/26 shows stable hematoma. Echocardiogram showed left ventricular ejection fraction of 65 to 70%. Neurology was on board and appreciate recommendations. Repeat CT head without contrast on the morning of 3/8 showed Expected regression of right ICH.  Residual hematoma and swelling causes 6 mm of midline shift. Small left occipital cortex infarcts which have become apparent by CT. No changes in neuro exam in the last 48 hours.  Transition to oral eliquis today.     Hypertensive emergency Bp parameters are well controlled.  She was weaned off Cleviprex. Continue with 10 mg of Norvasc, clonidine 0.1 mg 3 times prn,  Coreg 25 mg twice daily, spironolactone 35m daily and cozaar 100 mg daily. .    AKI Improvement in creatinine with IV fluids.  Suspect from dehydration.  Added free water to the tube feeds.     Elevated troponins Probably secondary to hypertensive emergency. Demand  ischemia. EKG does not show any ischemic changes    Acute respiratory failure secondary to pulmonary edema Stridor due to upper airway edema Failed extubation twice finally underwent tracheostomy on 02/14/2022 Continue with pulmonary toilet Continue with trach collar.  SLP on board with PMSV trials. CT angio of the chest showed Bilateral pulmonary emboli as described above without evidence of right heart strain. Bilateral lower lobe consolidation with associated small effusions. Pt started on IV heparin for the pulmonary emboli,.  Venous duplex ordered and done, results are pending.  She was also started on IV ancef for MSSA pneumonia. D/c IV ANCEF in 2 days.  Pt had hemoptysis from trach, blood tinged secretions, pt not in any distress.  No hemoptysis from the trach. Transition to eliquis today without bolus.     Nutrition s/p cortrak and is on tube feeds.    Urinary retention:  S/p In and out yesterday.      Hypercalcemia:  Improved with IV FLUIDS.   Malnutrition Type:  Nutrition Problem: Inadequate oral intake Etiology: inability to eat   Malnutrition Characteristics:  Signs/Symptoms: NPO status   Nutrition Interventions:  Interventions: Tube feeding, Prostat  Estimated  body mass index is 28.19 kg/m as calculated from the following:   Height as of this encounter: _0  (1.626 m).   Weight as of this encounter: 74.5 kg.  Code Status: full code.  DVT Prophylaxis:  SCD's Start: 02/05/22 0846   Level of Care: Level of care: Progressive Family Communication: none at bedside.   Disposition Plan:     Remains inpatient appropriate:  unsafe d/c plan.   Procedures:  CT angio of the chest.   Consultants:   Neurology   Antimicrobials:   Anti-infectives (From admission, onward)    Start     Dose/Rate Route Frequency Ordered Stop   02/22/22 2200  ceFAZolin (ANCEF) IVPB 2g/100 mL premix        2 g 200 mL/hr over 30 Minutes Intravenous Every 8 hours  02/22/22 1101     02/22/22 1100  vancomycin (VANCOREADY) IVPB 1250 mg/250 mL  Status:  Discontinued        1,250 mg 166.7 mL/hr over 90 Minutes Intravenous Every 24 hours 02/21/22 1150 02/22/22 1059   02/21/22 1800  azithromycin (ZITHROMAX) 500 mg in sodium chloride 0.9 % 250 mL IVPB  Status:  Discontinued        500 mg 250 mL/hr over 60 Minutes Intravenous Every 24 hours 02/21/22 1701 02/22/22 1059   02/21/22 1000  vancomycin (VANCOREADY) IVPB 1750 mg/350 mL        1,750 mg 175 mL/hr over 120 Minutes Intravenous  Once 02/21/22 0900 02/21/22 1246   02/21/22 1000  ceFEPIme (MAXIPIME) 2 g in sodium chloride 0.9 % 100 mL IVPB  Status:  Discontinued        2 g 200 mL/hr over 30 Minutes Intravenous Every 12 hours 02/21/22 0903 02/22/22 1059   02/21/22 0945  cefTRIAXone (ROCEPHIN) 2 g in sodium chloride 0.9 % 100 mL IVPB  Status:  Discontinued        2 g 200 mL/hr over 30 Minutes Intravenous Every 24 hours 02/21/22 0850 02/21/22 0903   02/14/22 0945  ceFEPIme (MAXIPIME) 2 g in sodium chloride 0.9 % 100 mL IVPB        2 g 200 mL/hr over 30 Minutes Intravenous Every 8 hours 02/14/22 0845 02/16/22 0713   02/10/22 1400  vancomycin (VANCOCIN) IVPB 1000 mg/200 mL premix  Status:  Discontinued        1,000 mg 200 mL/hr over 60 Minutes Intravenous Every 24 hours 02/09/22 1214 02/10/22 0808   02/09/22 1300  vancomycin (VANCOREADY) IVPB 1500 mg/300 mL        1,500 mg 150 mL/hr over 120 Minutes Intravenous  Once 02/09/22 1214 02/09/22 1608   02/09/22 1245  ceFEPIme (MAXIPIME) 2 g in sodium chloride 0.9 % 100 mL IVPB  Status:  Discontinued        2 g 200 mL/hr over 30 Minutes Intravenous Every 12 hours 02/09/22 1145 02/14/22 0845        Medications  Scheduled Meds:  amLODipine  10 mg Per Tube Daily   carvedilol  25 mg Per Tube BID WC   chlorhexidine gluconate (MEDLINE KIT)  15 mL Mouth Rinse BID   Chlorhexidine Gluconate Cloth  6 each Topical Q0600   clonazePAM  1.5 mg Per Tube BID    dextromethorphan  30 mg Per Tube BID   feeding supplement (PROSource TF)  45 mL Per Tube BID   free water  200 mL Per Tube Q6H   guaiFENesin  200 mg Per Tube Q6H   insulin aspart  0-15 Units Subcutaneous Q4H   losartan  100 mg Per Tube Daily   mouth rinse  15 mL Mouth Rinse 10 times per day   multivitamin with minerals  1 tablet Per Tube Daily   pantoprazole sodium  40 mg Per Tube Daily   QUEtiapine  50 mg Per Tube QHS   spironolactone  50 mg Per Tube Daily   Continuous Infusions:   ceFAZolin (ANCEF) IV 2 g (02/25/22 0555)   feeding supplement (JEVITY 1.5 CAL/FIBER) 1,000 mL (02/21/22 2225)   heparin 1,300 Units/hr (02/24/22 1731)   PRN Meds:.acetaminophen **OR** acetaminophen (TYLENOL) oral liquid 160 mg/5 mL **OR** acetaminophen, cloNIDine, hydrALAZINE, labetalol, midazolam, oxyCODONE, senna-docusate    Subjective:   Sarah Myers was seen and examined today.  Opening eyes with verbal cues.  Moving her  right leg.   Objective:   Vitals:   02/25/22 0446 02/25/22 0537 02/25/22 0726 02/25/22 0857  BP:   126/73   Pulse: 72  68 76  Resp: 14  20 (!) 22  Temp:   98 F (36.7 C)   TempSrc:   Axillary   SpO2:    97%  Weight:  74.5 kg    Height:        Intake/Output Summary (Last 24 hours) at 02/25/2022 1009 Last data filed at 02/25/2022 0602 Gross per 24 hour  Intake 2860.6 ml  Output 1600 ml  Net 1260.6 ml    Filed Weights   02/21/22 0500 02/24/22 0408 02/25/22 0537  Weight: 73 kg 74.3 kg 74.5 kg     Exam General exam: Appears calm and comfortable  Respiratory system: Clear to auscultation. Respiratory effort normal. Cardiovascular system: S1 & S2 heard, RRR. No JVD, No pedal edema. Gastrointestinal system: Abdomen is nondistended, soft and nontender. Normal bowel sounds heard. Central nervous system: Alert and oriented. No focal neurological deficits. Extremities: no pedal edema. . Skin: No rashes,  Psychiatry: restless.     Data Reviewed:  I have  personally reviewed following labs and imaging studies   CBC Lab Results  Component Value Date   WBC 5.9 02/25/2022   RBC 3.93 02/25/2022   HGB 11.1 (L) 02/25/2022   HCT 34.9 (L) 02/25/2022   MCV 88.8 02/25/2022   MCH 28.2 02/25/2022   PLT 208 02/25/2022   MCHC 31.8 02/25/2022   RDW 13.7 02/25/2022   LYMPHSABS 1.6 02/23/2022   MONOABS 0.6 02/23/2022   EOSABS 0.3 02/23/2022   BASOSABS 0.1 54/27/0623     Last metabolic panel Lab Results  Component Value Date   NA 135 02/25/2022   K 4.5 02/25/2022   CL 104 02/25/2022   CO2 24 02/25/2022   BUN 33 (H) 02/25/2022   CREATININE 0.93 02/25/2022   GLUCOSE 157 (H) 02/25/2022   GFRNONAA >60 02/25/2022   GFRAA 119 08/18/2020   CALCIUM 11.1 (H) 02/25/2022   PHOS 3.1 02/08/2022   PROT 6.8 02/19/2022   ALBUMIN 2.6 (L) 02/19/2022   LABGLOB 3.5 06/29/2020   AGRATIO 1.3 06/29/2020   BILITOT 0.3 02/19/2022   ALKPHOS 54 02/19/2022   AST 34 02/19/2022   ALT 54 (H) 02/19/2022   ANIONGAP 7 02/25/2022    CBG (last 3)  Recent Labs    02/24/22 2304 02/25/22 0333 02/25/22 0748  GLUCAP 143* 137* 159*       Coagulation Profile: No results for input(s): INR, PROTIME in the last 168 hours.   Radiology Studies: DG CHEST PORT 1 VIEW  Result Date: 02/24/2022 CLINICAL DATA:  Hemoptysis. EXAM:  PORTABLE CHEST 1 VIEW COMPARISON:  February 20, 2022. FINDINGS: Stable cardiomegaly. Tracheostomy and feeding tubes are unchanged in position. Both lungs are clear. The visualized skeletal structures are unremarkable. IMPRESSION: Stable support apparatus. No acute cardiopulmonary abnormality is seen. Electronically Signed   By: Marijo Conception M.D.   On: 02/24/2022 11:08   VAS [place] LOWER EXTREMITY VENOUS (DVT)  Result Date: 02/23/2022  Lower Venous DVT Study Patient Name:  Sarah Myers  Date of Exam:   02/23/2022 Medical Rec #: [id number]            Accession #:    [id number] Date of Birth: December 12, 1971             Patient Gender: F Patient Age:   38  years Exam Location:  [facility] Procedure:      VAS [place] LOWER EXTREMITY VENOUS (DVT) Referring Phys: Cornelius Moras XU --------------------------------------------------------------------------------  Indications: Pulmonary embolism.  Comparison Study: No previous exams Performing Technologist: Sharion Dove RVS  Examination Guidelines: A complete evaluation includes B-mode imaging, spectral Doppler, color Doppler, and power Doppler as needed of all accessible portions of each vessel. Bilateral testing is considered an integral part of a complete examination. Limited examinations for reoccurring indications may be performed as noted. The reflux portion of the exam is performed with the patient in reverse Trendelenburg.  +---------+---------------+---------+-----------+----------+--------------+  RIGHT     Compressibility Phasicity Spontaneity Properties Thrombus Aging  +---------+---------------+---------+-----------+----------+--------------+  CFV       Full            Yes       Yes                                    +---------+---------------+---------+-----------+----------+--------------+  SFJ       Full                                                             +---------+---------------+---------+-----------+----------+--------------+  FV Prox   Full                                                             +---------+---------------+---------+-----------+----------+--------------+  FV Mid    Full                                                             +---------+---------------+---------+-----------+----------+--------------+  FV Distal Full                                                             +---------+---------------+---------+-----------+----------+--------------+  PFV       Full                                                             +---------+---------------+---------+-----------+----------+--------------+  POP       Full                                                              +---------+---------------+---------+-----------+----------+--------------+  PTV       Full                                                             +---------+---------------+---------+-----------+----------+--------------+  PERO      Full                                                             +---------+---------------+---------+-----------+----------+--------------+   +---------+---------------+---------+-----------+----------+--------------+  LEFT      Compressibility Phasicity Spontaneity Properties Thrombus Aging  +---------+---------------+---------+-----------+----------+--------------+  CFV       Full            Yes       Yes                                    +---------+---------------+---------+-----------+----------+--------------+  SFJ       Full                                                             +---------+---------------+---------+-----------+----------+--------------+  FV Prox   Full                                                             +---------+---------------+---------+-----------+----------+--------------+  FV Mid    Full                                                             +---------+---------------+---------+-----------+----------+--------------+  FV Distal Full                                                             +---------+---------------+---------+-----------+----------+--------------+  PFV       Full                                                             +---------+---------------+---------+-----------+----------+--------------+  POP       Full                                                             +---------+---------------+---------+-----------+----------+--------------+  PTV       Full                                                             +---------+---------------+---------+-----------+----------+--------------+  PERO      Full                                                              +---------+---------------+---------+-----------+----------+--------------+     Summary: BILATERAL: - No evidence of deep vein thrombosis seen in the lower extremities, bilaterally. - No evidence of superficial venous thrombosis in the lower extremities, bilaterally. -No evidence of popliteal cyst, bilaterally.   *See table(s) above for measurements and observations. Electronically signed by Jamelle Haring on 02/23/2022 at 8:55:10 PM.    Final        Hosie Poisson M.D. [facility] 02/25/2022, 10:09 AM  Available via Epic secure chat 7am-7pm After 7 pm, please refer to night coverage provider listed on amion.

## 2022-02-25 NOTE — Progress Notes (Signed)
This nurse removed the 2 trach sutures. Pt tolerated well. No complications.  ? ?

## 2022-02-26 ENCOUNTER — Inpatient Hospital Stay (HOSPITAL_COMMUNITY): Payer: 59

## 2022-02-26 LAB — GLUCOSE, CAPILLARY
Glucose-Capillary: 130 mg/dL — ABNORMAL HIGH (ref 70–99)
Glucose-Capillary: 133 mg/dL — ABNORMAL HIGH (ref 70–99)
Glucose-Capillary: 134 mg/dL — ABNORMAL HIGH (ref 70–99)
Glucose-Capillary: 136 mg/dL — ABNORMAL HIGH (ref 70–99)
Glucose-Capillary: 143 mg/dL — ABNORMAL HIGH (ref 70–99)
Glucose-Capillary: 156 mg/dL — ABNORMAL HIGH (ref 70–99)

## 2022-02-26 LAB — URINALYSIS, COMPLETE (UACMP) WITH MICROSCOPIC
Bacteria, UA: NONE SEEN
Bilirubin Urine: NEGATIVE
Glucose, UA: NEGATIVE mg/dL
Hgb urine dipstick: NEGATIVE
Ketones, ur: 5 mg/dL — AB
Leukocytes,Ua: NEGATIVE
Nitrite: NEGATIVE
Protein, ur: NEGATIVE mg/dL
Specific Gravity, Urine: 1.017 (ref 1.005–1.030)
pH: 5 (ref 5.0–8.0)

## 2022-02-26 LAB — BASIC METABOLIC PANEL
Anion gap: 7 (ref 5–15)
BUN: 36 mg/dL — ABNORMAL HIGH (ref 6–20)
CO2: 23 mmol/L (ref 22–32)
Calcium: 11 mg/dL — ABNORMAL HIGH (ref 8.9–10.3)
Chloride: 101 mmol/L (ref 98–111)
Creatinine, Ser: 0.98 mg/dL (ref 0.44–1.00)
GFR, Estimated: >60 mL/min (ref >60)
Glucose, Bld: 180 mg/dL — ABNORMAL HIGH (ref 70–99)
Potassium: 4.9 mmol/L (ref 3.5–5.1)
Sodium: 131 mmol/L — ABNORMAL LOW (ref 135–145)

## 2022-02-26 LAB — CULTURE, BLOOD (ROUTINE X 2)
Culture: NO GROWTH
Culture: NO GROWTH

## 2022-02-26 LAB — CBC
HCT: 32.3 % — ABNORMAL LOW (ref 36.0–46.0)
Hemoglobin: 10.3 g/dL — ABNORMAL LOW (ref 12.0–15.0)
MCH: 28.5 pg (ref 26.0–34.0)
MCHC: 31.9 g/dL (ref 30.0–36.0)
MCV: 89.5 fL (ref 80.0–100.0)
Platelets: 209 10*3/uL (ref 150–400)
RBC: 3.61 MIL/uL — ABNORMAL LOW (ref 3.87–5.11)
RDW: 13.8 % (ref 11.5–15.5)
WBC: 6.5 10*3/uL (ref 4.0–10.5)
nRBC: 0 % (ref 0.0–0.2)

## 2022-02-26 MED ORDER — FREE WATER
200.0000 mL | Freq: Three times a day (TID) | Status: DC
  Administered 2022-02-26 – 2022-03-17 (×52): 200 mL

## 2022-02-26 MED ORDER — APIXABAN 5 MG PO TABS: 5.0000 mg | ORAL_TABLET | Freq: Two times a day (BID) | ORAL | Status: DC

## 2022-02-26 MED ORDER — APIXABAN 5 MG PO TABS
5.0000 mg | ORAL_TABLET | Freq: Two times a day (BID) | ORAL | Status: DC
Start: 2022-02-26 — End: 2022-02-28
  Administered 2022-02-26 – 2022-02-28 (×4): 5 mg via ORAL
  Filled 2022-02-26 (×4): qty 1

## 2022-02-26 NOTE — Progress Notes (Addendum)
STROKE TEAM PROGRESS NOTE  ? ?INTERVAL HISTORY ?Patient is seen in her room with her nephew at bedside.   ? ?Still lethargic. Not following commands. Will open eyes to voice/stimulus. On eliquis, no bleeding reported by nurse. Trach stiches removed yesterday.  ? ?HCT today. ? ?Vitals:  ? 02/26/22 0436 02/26/22 0437 02/26/22 0551 02/26/22 0641  ?BP: 118/64     ?Pulse: 78 76    ?Resp: (!) 22 (!) 27    ?Temp: 99 ?F (37.2 ?C)  99.1 ?F (37.3 ?C)   ?TempSrc: Oral  Oral   ?SpO2: 100% 100%    ?Weight:    74.5 kg  ?Height:      ? ?CBC:  ?Recent Labs  ?Lab 02/22/22 ?0213 02/23/22 ?0437 02/24/22 ?0329 02/25/22 ?0121 02/26/22 ?0026  ?WBC 10.5 8.2   < > 5.9 6.5  ?NEUTROABS 7.2 5.7  --   --   --   ?HGB 12.5 11.1*   < > 11.1* 10.3*  ?HCT 38.8 34.0*   < > 34.9* 32.3*  ?MCV 90.2 89.0   < > 88.8 89.5  ?PLT 206 PLATELET CLUMPS NOTED ON SMEAR, UNABLE TO ESTIMATE   < > 208 209  ? < > = values in this interval not displayed.  ? ? ?Basic Metabolic Panel:  ?Recent Labs  ?Lab 02/25/22 ?0121 02/26/22 ?0026  ?NA 135 131*  ?K 4.5 4.9  ?CL 104 101  ?CO2 24 23  ?GLUCOSE 157* 180*  ?BUN 33* 36*  ?CREATININE 0.93 0.98  ?CALCIUM 11.1* 11.0*  ? ? ?Lipid Panel:  ?No results for input(s): CHOL, TRIG, HDL, CHOLHDL, VLDL, LDLCALC in the last 168 hours. ?HgbA1c: No results for input(s): HGBA1C in the last 168 hours. ?Urine Drug Screen: No results for input(s): LABOPIA, COCAINSCRNUR, LABBENZ, AMPHETMU, THCU, LABBARB in the last 168 hours.  ?Alcohol Level No results for input(s): ETH in the last 168 hours. ? ?IMAGING past 24 hours ?No results found. ? ?PHYSICAL EXAM ? ?Temp:  [97.6 ?F (36.4 ?C)-99.1 ?F (37.3 ?C)] 99.1 ?F (37.3 ?C) (03/12 0551) ?Pulse Rate:  [69-79] 76 (03/12 0437) ?Resp:  [16-27] 27 (03/12 0437) ?BP: (101-138)/(56-78) 118/64 (03/12 0436) ?SpO2:  [94 %-100 %] 100 % (03/12 0437) ?FiO2 (%):  [21 %-28 %] 21 % (03/12 0437) ?Weight:  [74.5 kg] 74.5 kg (03/12 0641) ? ?General - Well nourished, well developed, in no apparent distress, but  lethargic. ? ?Neuro - Patient lying in bed, will open eyes on voice. Appears lethargic did not follow commands today. Will oepn eyes to voice/stimulus and move right side to sternal rub and spontaneously. Did not answer yes/no questions.  On trach collar.  PERRL 31mm. Left facial droop. Did not protrude tongue today. ? ?LUE plegia and LLE mild withdraw to pain. RUE and RLE will move briefly to antigravity.  coordination not cooperative, gait not tested.  ? ?  ?  ?ASSESSMENT/PLAN ?Ms. LATISA ZURICK is a 51 y.o. female with history of HTN presenting with AMS, L sided weakness, and slurred speech. Attempted extubation 2/23 and patient had post-extubation stridor leading to re-intubation. Cords non-edematous.  Re-attempted extubation 2/26, and patient failed leading to reintubation.  Tracheostomy placed 2/28, sedation weaned.on seroquel and  PT/OT.  Now tolerating trach collar well. On eliquis, no bleeding issues. Will get HCT today, if neg no further workup. ? ?Neurology will sign off. Call with questions.  ?  ?Bilateral PE ?Elevated D-dimer 4.83 ?CTA chest showed b/l PE without right heart strain ?On heparin IV per stroke  protocol -  Now on  eliquis  ?LE venous Dopple no DVT ?Management per primary team ? 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 Intra-axial blood volume slightly greater than 33 mL. Mass effect with 4-5 mm leftward midline shift; superimposed chronic meningocele progressing since 2016.  ?Repeat CTH at 0045 with 36 mL hemorrhagic area, and 0835 repeat unchanged. ?CTA head & neck No large vessel occlusion, intracranial aneurysm, or abnormal vasculature associated with hemorrhage. 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 ?CT repeat 2/20 stable hematoma and MLS ?Follow-up MRI 2/21 with some extension of edema and stable  ICH. ?Follow up CT 2/24 and 2/26 with no progression of ICH; 70mm shift on the latter ?CT repeat 3/8 showed near resolution of right large BG hemorrhage ?2D Echo LVEF 65-70%, small pericardial effusion present; trival MVR ?LDL 64 ?HgbA1c 5.6 ?VTE prophylaxis - heparin IV now ?Therapy recommendations:  SNF ?Disposition:  Pending  ?  ?Cerebral edema ?CT head showed midline shift 5 mm;  ?Repeat CT stable hematoma and midline shift ?MRI with some extension of edema and stable ICH, intraventricular extension noted. ?Follow up CT 2/24 with no progression of ICH ?CT 2/26 stable with 26mm shift. ?CT 3/8 expected regression of right ICH, residual hematoma in setting with 6 mm of midline shift ?  ?Hypertensive emergency ?SBP as high as 250 on presentation ?Home meds:  Coreg 25 mg BID, HCTZ 25 ?Off of Cleviprex  ?Continue Norvasc 10 mg and Coreg 25 mg BID and cozaar 100 ?BP goal less than 160 ?Long-term BP goal normotensive ?  ?AKI ?Cre 0.94->1.16->1.21->1.07 ?Losartan restarted ?BMP monitoring ?Continue TF ?Management per primary team ?  ?Respiratory failure ?Intubated in the ED for airway protection ?Extubated ->re-intubated 2/23 and 2/26.   ?Trached 2/28 ?Fentanyl and versed PRN for agitation and pain ?On trach collar, tolerating well ?  ?Leukocytosis ?WBC downtrending 11.6-> 10.8->10.5->6.7>5.9>6.5 ?UA negative ?sputum culture moderate WBC, gram pos cocci in pairs and clusters (S. Aureus and S. Marcescens) ?off cefepime, vancomycin and azithromycin->on ancef now ? ?Other Stroke Risk Factors ?Family hx stroke (Maternal Uncle) ?  ?Other Active Problems ?Trach site bleeding - traumatic-now stabilized.  ?Pressure ulcer of L hand ?  ?Hospital day # 10 ?   ?Reuel Lamadrid,MD  ? ? 02/26/22 5:58 ADDENDUM: repeat HCT is stable on my review. Official read still pending. Will sign off, call Neurology if there is a concern.  ? ?To contact Stroke Continuity provider, please refer to [email or web address] ?After hours, contact General Neurology  ?

## 2022-02-26 NOTE — Progress Notes (Signed)
HOSPITAL MEDICINE OVERNIGHT EVENT NOTE   ? ?Informed by nursing patient unable to urinate, bladder scan revealing 416 cc. ? ?Performing in and out cath, obtaining UA and culture.  Day team to follow up on results.  ? ?Vernelle Emerald  MD ?[facility]  ? ? ? ? ? ? ? ? ? ? ? ?

## 2022-02-26 NOTE — Progress Notes (Addendum)
SLP Cancellation Note ? ?Patient Details ?Name: Sarah Myers ?MRN: [id number] ?DOB: September 10, 1971 ? ? ?Cancelled treatment:       Reason Eval/Treat Not Completed: Patient's level of consciousness. SLP unable to arouse patient despite several attempts. Patient kept eyes closed but did push away at SLP when cold washcloth applied to face. Trach cuff was deflated when SLP arrived. SLP will plan to follow up next date for patient readiness for PMV trial.  ? ? ?Angela Nevin, MA, CCC-SLP ?Speech Therapy ? ?

## 2022-02-26 NOTE — Progress Notes (Signed)
[facility]                                                                               Sarah Myers, is a 51 y.o. female, DOB - December 02, 1971, [id number] Admit date - 02/05/2022    Outpatient Primary MD for the patient is Sarah Mai, MD  LOS - 21  days    Brief summary   51 y/o female came to the [facility] ER on 2/19 with left sided weakness and slurred speech. Noted to have a right ICH with brain compression present on admission. Patient was admitted to neuro ICU, received 3% saline, cleviprex, and eventually intubated on 2/19.  Patient failed extubation x2, and subsequently had a tracheostomy on 2/28, trach collar placed on 3/1, was eventually taken off vent on 3/4.   [facility] assumed care on 02/20/2022.    Assessment & Plan    Assessment and Plan:  Intracerebral hemorrhage Acute right large basal ganglion ICH probably secondary to hypertensive emergency. CT head showed acute right lentiform hemorrhage with mass effect, 4 to 5 mm leftward midline shift. Repeat CT on 2/26 shows stable hematoma. Echocardiogram showed left ventricular ejection fraction of 65 to 70%. Neurology was on board and appreciate recommendations. Repeat CT head without contrast on the morning of 3/8 showed Expected regression of right ICH.  Residual hematoma and swelling causes 6 mm of midline shift. Small left occipital cortex infarcts which have become apparent by CT. Pt sleeping comfortably,  possibly because she did not sleep last night.     Hypertensive emergency Bp parameters are optimal.  She was weaned off Cleviprex. Continue with 10 mg of Norvasc, clonidine 0.1 mg 3 times prn,  Coreg 25 mg twice daily, spironolactone 73m daily and cozaar 100 mg daily. .Marland KitchenNo changes in meds.     AKI Improvement in creatinine with IV fluids.  Suspect from dehydration.  Added free water to the tube feeds.      Elevated troponins Probably secondary to hypertensive  emergency. Demand ischemia. EKG does not show any ischemic changes    Acute respiratory failure secondary to pulmonary edema Stridor due to upper airway edema Failed extubation twice finally underwent tracheostomy on 02/14/2022 Continue with pulmonary toilet Continue with trach collar.  SLP on board with PMSV trials. CT angio of the chest showed Bilateral pulmonary emboli as described above without evidence of right heart strain. Bilateral lower lobe consolidation with associated small effusions. Pt started on IV heparin for the pulmonary emboli, transitioned to eliquis without bolus.  Venous duplex ordered and done, results are pending.  She was also started on IV ancef for MSSA pneumonia. Complete the course of antibiotics.      Nutrition s/p cortrak and is on tube feeds.    Urinary retention:  S/p In and out  twice in the last one week.  Unclear etiology. UA does not show any signs of infection.  If it happens again, will place foley catheter.    Hyponatremia:  Unclear etiology.  TSH wnl.  Will check serum osmo, urine osmo and urine sodium .     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 Subcutaneous fat may be visible but bone, tendon or muscle are NOT exposed. (Active)  02/26/22 0443  Location: Cervical  Location Orientation: Left  Staging: Stage 3 -  Full thickness tissue loss. Subcutaneous fat may be visible but bone, tendon or muscle are NOT exposed.  Wound Description (Comments):   Present on Admission: No    Hypercalcemia:  Persistent, will get  INTACT PTH and calcium to check for hyperparathyroidism, .   Malnutrition Type:  Nutrition Problem: Inadequate oral intake Etiology: inability to eat   Malnutrition Characteristics:  Signs/Symptoms: NPO status   Nutrition Interventions:  Interventions: Tube feeding, Prostat  Estimated body mass index is 28.19 kg/m as calculated from the following:   Height as of this  encounter: '5\' 4"'  (1.626 m).   Weight as of this encounter: 74.5 kg.  Code Status: full code.  DVT Prophylaxis:  SCD's Start: 02/05/22 0846 apixaban (ELIQUIS) tablet 10 mg  apixaban (ELIQUIS) tablet 5 mg   Level of Care: Level of care: Progressive Family Communication: none at bedside.   Disposition Plan:     Remains inpatient appropriate:  unsafe d/c plan.   Procedures:  CT angio of the chest.   Consultants:   Neurology   Antimicrobials:   Anti-infectives (From admission, onward)    Start     Dose/Rate Route Frequency Ordered Stop   02/22/22 2200  ceFAZolin (ANCEF) IVPB 2g/100 mL premix        2 g 200 mL/hr over 30 Minutes Intravenous Every 8 hours 02/22/22 1101     02/22/22 1100  vancomycin (VANCOREADY) IVPB 1250 mg/250 mL  Status:  Discontinued        1,250 mg 166.7 mL/hr over 90 Minutes Intravenous Every 24 hours 02/21/22 1150 02/22/22 1059   02/21/22 1800  azithromycin (ZITHROMAX) 500 mg in sodium chloride 0.9 % 250 mL IVPB  Status:  Discontinued        500 mg 250 mL/hr over 60 Minutes Intravenous Every 24 hours 02/21/22 1701 02/22/22 1059   02/21/22 1000  vancomycin (VANCOREADY) IVPB 1750 mg/350 mL        1,750 mg 175 mL/hr over 120 Minutes Intravenous  Once 02/21/22 0900 02/21/22 1246   02/21/22 1000  ceFEPIme (MAXIPIME) 2 g in sodium chloride 0.9 % 100 mL IVPB  Status:  Discontinued        2 g 200 mL/hr over 30 Minutes Intravenous Every 12 hours 02/21/22 0903 02/22/22 1059   02/21/22 0945  cefTRIAXone (ROCEPHIN) 2 g in sodium chloride 0.9 % 100 mL IVPB  Status:  Discontinued        2 g 200 mL/hr over 30 Minutes Intravenous Every 24 hours 02/21/22 0850 02/21/22 0903   02/14/22 0945  ceFEPIme (MAXIPIME) 2 g in sodium chloride 0.9 % 100 mL IVPB        2 g 200 mL/hr over 30 Minutes Intravenous Every 8 hours 02/14/22 0845 02/16/22 0713   02/10/22 1400  vancomycin (VANCOCIN) IVPB 1000 mg/200 mL premix  Status:  Discontinued        1,000 mg 200 mL/hr over 60 Minutes  Intravenous Every 24 hours 02/09/22 1214 02/10/22 0808   02/09/22 1300  vancomycin (VANCOREADY) IVPB 1500 mg/300 mL        1,500 mg 150 mL/hr over 120 Minutes Intravenous  Once 02/09/22 1214 02/09/22 1608   02/09/22 1245  ceFEPIme (MAXIPIME) 2 g in sodium chloride 0.9 % 100 mL IVPB  Status:  Discontinued  2 g 200 mL/hr over 30 Minutes Intravenous Every 12 hours 02/09/22 1145 02/14/22 0845        Medications  Scheduled Meds:  amLODipine  10 mg Per Tube Daily   apixaban  10 mg Per Tube BID   Followed by   Derrill Memo ON 03/04/2022] apixaban  5 mg Per Tube BID   carvedilol  25 mg Per Tube BID WC   chlorhexidine gluconate (MEDLINE KIT)  15 mL Mouth Rinse BID   Chlorhexidine Gluconate Cloth  6 each Topical Q0600   clonazePAM  1.5 mg Per Tube BID   dextromethorphan  30 mg Per Tube BID   feeding supplement (PROSource TF)  45 mL Per Tube BID   free water  200 mL Per Tube Q6H   guaiFENesin  200 mg Per Tube Q6H   insulin aspart  0-15 Units Subcutaneous Q4H   losartan  100 mg Per Tube Daily   mouth rinse  15 mL Mouth Rinse 10 times per day   multivitamin with minerals  1 tablet Per Tube Daily   pantoprazole sodium  40 mg Per Tube Daily   QUEtiapine  50 mg Per Tube QHS   spironolactone  50 mg Per Tube Daily   Continuous Infusions:   ceFAZolin (ANCEF) IV 2 g (02/26/22 0534)   feeding supplement (JEVITY 1.5 CAL/FIBER) 1,000 mL (02/26/22 0528)   PRN Meds:.acetaminophen **OR** acetaminophen (TYLENOL) oral liquid 160 mg/5 mL **OR** acetaminophen, cloNIDine, hydrALAZINE, labetalol, midazolam, oxyCODONE, senna-docusate    Subjective:   Sarah Myers was seen and examined today.  Sleeping comfortably. No distress noted. Not following commands today. Family member at bedside.  Objective:   Vitals:   02/26/22 0436 02/26/22 0437 02/26/22 0551 02/26/22 0641  BP: 118/64     Pulse: 78 76    Resp: (!) 22 (!) 27    Temp: 99 F (37.2 C)  99.1 F (37.3 C)   TempSrc: Oral  Oral   SpO2:  100% 100%    Weight:    74.5 kg  Height:        Intake/Output Summary (Last 24 hours) at 02/26/2022 1122 Last data filed at 02/26/2022 0700 Gross per 24 hour  Intake 2200 ml  Output 1500 ml  Net 700 ml    Filed Weights   02/24/22 0408 02/25/22 0537 02/26/22 0641  Weight: 74.3 kg 74.5 kg 74.5 kg     Exam General exam: Appears calm and comfortable  Respiratory system: Clear to auscultation. Respiratory effort normal. Cardiovascular system: S1 & S2 heard, RRR. No JVD, . No pedal edema. Gastrointestinal system: Abdomen is nondistended, soft and nontender.  Normal bowel sounds heard. Central nervous system: sleeping, grunting to sternal rub.  Extremities: no pedal edema.  Skin: No rashes, lesions or ulcers Psychiatry: unable to assess.      Data Reviewed:  I have personally reviewed following labs and imaging studies   CBC Lab Results  Component Value Date   WBC 6.5 02/26/2022   RBC 3.61 (L) 02/26/2022   HGB 10.3 (L) 02/26/2022   HCT 32.3 (L) 02/26/2022   MCV 89.5 02/26/2022   MCH 28.5 02/26/2022   PLT 209 02/26/2022   MCHC 31.9 02/26/2022   RDW 13.8 02/26/2022   LYMPHSABS 1.6 02/23/2022   MONOABS 0.6 02/23/2022   EOSABS 0.3 02/23/2022   BASOSABS 0.1 15/17/6160     Last metabolic panel Lab Results  Component Value Date   NA 131 (L) 02/26/2022   K 4.9 02/26/2022   CL 101  02/26/2022   CO2 23 02/26/2022   BUN 36 (H) 02/26/2022   CREATININE 0.98 02/26/2022   GLUCOSE 180 (H) 02/26/2022   GFRNONAA >60 02/26/2022   GFRAA 119 08/18/2020   CALCIUM 11.0 (H) 02/26/2022   PHOS 3.1 02/08/2022   PROT 6.8 02/19/2022   ALBUMIN 2.6 (L) 02/19/2022   LABGLOB 3.5 06/29/2020   AGRATIO 1.3 06/29/2020   BILITOT 0.3 02/19/2022   ALKPHOS 54 02/19/2022   AST 34 02/19/2022   ALT 54 (H) 02/19/2022   ANIONGAP 7 02/26/2022    CBG (last 3)  Recent Labs    02/25/22 2311 02/26/22 0358 02/26/22 0817  GLUCAP 153* 156* 143*       Coagulation Profile: No results for  input(s): INR, PROTIME in the last 168 hours.   Radiology Studies: No results found.     Hosie Poisson M.D. [facility] 02/26/2022, 11:22 AM  Available via Epic secure chat 7am-7pm After 7 pm, please refer to night coverage provider listed on amion.

## 2022-02-27 LAB — BASIC METABOLIC PANEL
Anion gap: 8 (ref 5–15)
BUN: 34 mg/dL — ABNORMAL HIGH (ref 6–20)
CO2: 23 mmol/L (ref 22–32)
Calcium: 11.5 mg/dL — ABNORMAL HIGH (ref 8.9–10.3)
Chloride: 102 mmol/L (ref 98–111)
Creatinine, Ser: 0.94 mg/dL (ref 0.44–1.00)
GFR, Estimated: >60 mL/min (ref >60)
Glucose, Bld: 123 mg/dL — ABNORMAL HIGH (ref 70–99)
Potassium: 4.6 mmol/L (ref 3.5–5.1)
Sodium: 133 mmol/L — ABNORMAL LOW (ref 135–145)

## 2022-02-27 LAB — URINE CULTURE: Culture: NO GROWTH

## 2022-02-27 LAB — CORTISOL-AM, BLOOD: Cortisol - AM: 8.7 ug/dL (ref 6.7–22.6)

## 2022-02-27 LAB — PTH, INTACT AND CALCIUM
Calcium, Total (PTH): 11.4 mg/dL — ABNORMAL HIGH (ref 8.7–10.2)
PTH: 20 pg/mL (ref 15–65)

## 2022-02-27 LAB — OSMOLALITY: Osmolality: 295 mOsm/kg (ref 275–295)

## 2022-02-27 LAB — GLUCOSE, CAPILLARY
Glucose-Capillary: 130 mg/dL — ABNORMAL HIGH (ref 70–99)
Glucose-Capillary: 128 mg/dL — ABNORMAL HIGH (ref 70–99)
Glucose-Capillary: 173 mg/dL — ABNORMAL HIGH (ref 70–99)
Glucose-Capillary: 145 mg/dL — ABNORMAL HIGH (ref 70–99)
Glucose-Capillary: 139 mg/dL — ABNORMAL HIGH (ref 70–99)
Glucose-Capillary: 129 mg/dL — ABNORMAL HIGH (ref 70–99)

## 2022-02-27 LAB — CALCIUM, IONIZED: Calcium, Ionized, Serum: 6.6 mg/dL — ABNORMAL HIGH (ref 4.5–5.6)

## 2022-02-27 LAB — OSMOLALITY, URINE: Osmolality, Ur: 484 mOsm/kg (ref 300–900)

## 2022-02-27 LAB — SODIUM, URINE, RANDOM: Sodium, Ur: 77 mmol/L

## 2022-02-27 MED ORDER — JUVEN PO PACK
1.0000 | PACK | Freq: Two times a day (BID) | ORAL | Status: DC
Start: 2022-02-28 — End: 2022-03-17
  Administered 2022-02-28 – 2022-03-17 (×33): 1
  Filled 2022-02-27 (×33): qty 1

## 2022-02-27 NOTE — Plan of Care (Signed)
?  Problem: Safety: ?Goal: Non-violent Restraint(s) ?Outcome: Not Applicable ?  ?Problem: Nutrition: ?Goal: Adequate nutrition will be maintained ?Outcome: Progressing ?  ?Problem: Safety: ?Goal: Ability to remain free from injury will improve ?Outcome: Progressing ?  ?Problem: Skin Integrity: ?Goal: Risk for impaired skin integrity will decrease ?Outcome: Progressing ?  ?

## 2022-02-27 NOTE — Progress Notes (Signed)
Speech Language Pathology Treatment: [name] Speaking valve  ?Patient Details ?Name: Sarah Myers ?MRN: [id number] ?DOB: [birth year] ?Today's Date: 02/27/2022 ?Time: 1443-1540 ?SLP Time Calculation (min) (ACUTE ONLY): 14 min ? ?Assessment / Plan / Recommendation ?Clinical Impression ? Pt was drowsy but easily awakened and nodding a few times in response to questions. RT provided tracheal suction prior to PMV placement, which also woke her up more. After PMV placement, pt was able to produce short, soft, and dysphonic phonation but could not keep her valve on for more than ~30 seconds at a time. Valve was then either coughed off her trach hub or removed with evidence of back pressure. Recommend considering change to at least cuffless trach to try to provide increased upper airway patency.  ? ?Also attempted to provide PO trials as pt was more alert, but she would turn her head and shake it "no" when offered, nonverbally indicating that she did not want to take them. Will continue efforts.  ?  ?HPI HPI: 51 y/o female came to the [facility] ER on 2/19 with left sided weakness and slurred speech.  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 Intubated 2/19. Tracheostomy 2/28 (shiley 6, cuffed). ?  ?   ?SLP Plan ? Continue with current plan of care ? ?  ?  ?Recommendations for follow up therapy are one component of a multi-disciplinary discharge planning process, led by the attending physician.  Recommendations may be updated based on patient status, additional functional criteria and insurance authorization. ?  ? ?Recommendations  ?Diet recommendations: NPO ?Medication Administration: Via alternative means  ?   ? Patient may use Passy-Muir Speech Valve: with SLP only ?PMSV Supervision: Full ?MD: Please consider changing trach tube to : Cuffless  ?   ? ? ? ? Oral Care Recommendations: Oral care QID ?Follow Up Recommendations: Acute inpatient rehab (3hours/day) ?Assistance recommended at  discharge: Frequent or constant Supervision/Assistance ?SLP Visit Diagnosis: Aphonia (R49.1) ?Plan: Continue with current plan of care ? ? ? ? ?  ?  ? ? ?Mahala Menghini., M.A. CCC-SLP ?Acute Rehabilitation Services ?Pager [phone] ?Office [phone] ? ? ?02/27/2022, 4:42 PM ?

## 2022-02-27 NOTE — Progress Notes (Signed)
Occupational Therapy Treatment ?Patient Details ?Name: Sarah Myers ?MRN: [id number] ?DOB: 01-17-1971 ?Today's Date: 02/27/2022 ? ? ?History of present illness 51 y.o. female presents to [facility] hospital on 02/05/2022 with L hemiplegia and dysarthria. 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 Pt intubated 2/19. Attempted extubation 2/23 and 2/26, requiring reintubation same date. Tracheostomy 02/14/2022. PMH includes uncontrolled HTN ?  ?OT comments ? Sarah Myers is making incremental progress, session completed with PT for safety. Pt lethargic initially with eyes closed, once sitting EOB pt held eyes open for 90% of the session; R gaze noted with no tracking despite max multimodal cues. Pt remains total A for all aspects of her care including bed mobility. However sitting balance fluctuated from max A to brief periods of min A this date. Pt making good effort with righting reactions in sitting and purposeful movements of R side. She tolerated sitting fro ~20 minutes. D/c remains appropriate, OT to continue to follow acutely.   ? ?Recommendations for follow up therapy are one component of a multi-disciplinary discharge planning process, led by the attending physician.  Recommendations may be updated based on patient status, additional functional criteria and insurance authorization. ?   ?Follow Up Recommendations ? Skilled nursing-short term rehab (<3 hours/day)  ?  ?Assistance Recommended at Discharge Frequent or constant Supervision/Assistance  ?Patient can return home with the following ? Two people to help with walking and/or transfers;Two people to help with bathing/dressing/bathroom ?  ?Equipment Recommendations ? Hospital bed;Wheelchair cushion (measurements OT);Wheelchair (measurements OT)  ?  ?Recommendations for Other Services   ? ?  ?Precautions / Restrictions Precautions ?Precautions: Fall ?Precaution Comments: L neglect ?Restrictions ?Weight Bearing Restrictions: No  ? ? ?   ? ?Mobility Bed Mobility ?Overal bed mobility: Needs Assistance ?Bed Mobility: Supine to Sit, Sit to Supine ?  ?  ?Supine to sit: Total assist, +2 for physical assistance, +2 for safety/equipment ?Sit to supine: Total assist, +2 for physical assistance, +2 for safety/equipment ?  ?General bed mobility comments: Assist for all aspects ?  ? ?Transfers ?  ?  ?  ?  ?  ?  ?  ?  ?  ?  ?  ?  ?Balance Overall balance assessment: Needs assistance ?  ?Sitting balance-Leahy Scale: Zero (zero working towards poor) ?Sitting balance - Comments: Max A with intermittent min A sitting EOB for ~20 minutes ?  ?  ?  ?  ?  ?  ?  ?  ?  ?  ?  ?  ?  ?  ?  ?   ? ?ADL either performed or assessed with clinical judgement  ? ?ADL   ?  ?  ?  ?  ?  ?  ?  ?  ?  ?  ?  ?  ?  ?  ?  ?  ?  ?  ?  ?General ADL Comments: pt remains total A for all ADLs. Attempted grooming with a cloth at EOB, total A for RUE. Improved righting reactions and purposeful movements this date while sitting EOB ?  ? ?Extremity/Trunk Assessment Upper Extremity Assessment ?Upper Extremity Assessment: RUE deficits/detail;LUE deficits/detail ?RUE Deficits / Details: moving purposefully 50% of the time to right reactions. generally weak. did not use fucntionally to groom ?RUE Coordination: decreased fine motor;decreased gross motor ?LUE Deficits / Details: flaccid, ROM WFL. no response to painful stimuli ?LUE Sensation: decreased light touch ?LUE Coordination: decreased fine motor;decreased gross motor ?  ?Lower Extremity Assessment ?Lower Extremity  Assessment: Defer to PT evaluation ?  ?  ?  ? ?Vision   ?Vision Assessment?: Vision impaired- to be further tested in functional context ?Alignment/Gaze Preference: Gaze right ?Tracking/Visual Pursuits: Impaired - to be further tested in functional context ?Additional Comments: R gaze for the majority of the session, did come to midline some and to the L with max multimodial cues. No attempt made to track stimulus despite cues ?   ?Perception Perception ?Perception: Impaired ?  ?Praxis Praxis ?Praxis: Not tested ?  ? ?Cognition Arousal/Alertness: Lethargic ?Behavior During Therapy: Flat affect ?Overall Cognitive Status: Difficult to assess ?Area of Impairment: Attention, Following commands, Safety/judgement ?  ?  ?  ?  ?  ?  ?  ?  ?  ?  ?  ?  ?  ?  ?  ?General Comments: difficult to assess due to impairmetns listed below. Pt not shaking head yes/no this date. follow ~10% of commands. Eyes open 90% of session with R gaze; not tracking. Waved "bye" to therapist at the end of the session. ?  ?  ?   ?Exercises   ? ?  ?Shoulder Instructions   ? ? ?  ?General Comments VSS on on 28% FIO2  ? ? ?Pertinent Vitals/ Pain       Pain Assessment ?Pain Assessment: Faces ?Faces Pain Scale: No hurt ?Pain Intervention(s): Monitored during session ? ?Home Living   ?  ?  ?  ?  ?  ?  ?  ?  ?  ?  ?  ?  ?  ?  ?  ?  ?  ?  ? ?  ?Prior Functioning/Environment    ?  ?  ?  ?   ? ?Frequency ? Min 2X/week  ? ? ? ? ?  ?Progress Toward Goals ? ?OT Goals(current goals can now be found in the care plan section) ? Progress towards OT goals: Progressing toward goals ? ?Acute Rehab OT Goals ?OT Goal Formulation: Patient unable to participate in goal setting ?Time For Goal Achievement: 03/02/22 ?Potential to Achieve Goals: Fair ?ADL Goals ?Pt Will Perform Grooming: with min assist;bed level ?Additional ADL Goal #1: Patient will roll to the left with Min A to increase independence with toileting. ?Additional ADL Goal #2: Patient will sit edge of bed with supervison for up to 5 min to increase independence with grooming and UB ADL  ?Plan Discharge plan remains appropriate   ? ?Co-evaluation ? ? ? PT/OT/SLP Co-Evaluation/Treatment: Yes ?Reason for Co-Treatment: Complexity of the patient's impairments (multi-system involvement);To address functional/ADL transfers ?  ?  ?  ? ?  ?AM-PAC OT "6 Clicks" Daily Activity     ?Outcome Measure ? ? Help from another person eating meals?:  Total ?Help from another person taking care of personal grooming?: Total ?Help from another person toileting, which includes using toliet, bedpan, or urinal?: Total ?Help from another person bathing (including washing, rinsing, drying)?: Total ?Help from another person to put on and taking off regular upper body clothing?: Total ?Help from another person to put on and taking off regular lower body clothing?: Total ?6 Click Score: 6 ? ?  ?End of Session Equipment Utilized During Treatment: Oxygen ? ?OT Visit Diagnosis: Muscle weakness (generalized) (M62.81);Other symptoms and signs involving cognitive function;Hemiplegia and hemiparesis ?Hemiplegia - Right/Left: Left ?Hemiplegia - dominant/non-dominant: Dominant ?Hemiplegia - caused by: Nontraumatic intracerebral hemorrhage ?  ?Activity Tolerance Patient tolerated treatment well ?  ?Patient Left in bed;with call bell/phone within reach;with restraints reapplied ?  ?Nurse  Communication Mobility status ?  ? ?   ? ?Time: 3267-1245 ?OT Time Calculation (min): 40 min ? ?Charges: OT General Charges ?$OT Visit: 1 Visit ?OT Treatments ?$Therapeutic Activity: 8-22 mins ? ? ?Aijah Lattner A Jenesis Martin ?02/27/2022, 4:59 PM ?

## 2022-02-27 NOTE — Progress Notes (Signed)
````````````````        [facility]                                                                               [name], is a 51 y.o. female, DOB - Sep 06, 1971, BPZ:[id number] Admit date - 02/05/2022    Outpatient Primary MD for the patient is Sarah Mai, MD  LOS - 22  days    Brief summary   51 y/o female came to the [facility] ER on 2/19 with left sided weakness and slurred speech. Noted to have a right ICH with brain compression present on admission. Patient was admitted to neuro ICU, received 3% saline, cleviprex, and eventually intubated on 2/19.  Patient failed extubation x2, and subsequently had a tracheostomy on 2/28, trach collar placed on 3/1, was eventually taken off vent on 3/4.   [facility] assumed care on 02/20/2022.  Hospital course complicated by MSSA pneumonia. She was started on ANCEF to complete the course by 02/28/22.SLP following.     Assessment & Plan    Assessment and Plan:  Intracerebral hemorrhage Acute right large basal ganglion ICH probably secondary to hypertensive emergency. CT head showed acute right lentiform hemorrhage with mass effect, 4 to 5 mm leftward midline shift. Repeat CT on 2/26 shows stable hematoma. Echocardiogram showed left ventricular ejection fraction of 65 to 70%. Neurology on board and appreciate recommendations. Repeat CT head without contrast on the morning of 3/8 showed Expected regression of right ICH.  Residual hematoma and swelling causes 6 mm of midline shift. Small left occipital cortex infarcts which have become apparent by CT. Repeat CT on 3/12 shows Continued normal expected interval evolution of intraparenchymal hematoma centered at the right lentiform nucleus, slightly decreased in size now measuring 3.7 x 2.3 x 1.8 cm. Surrounding vasogenic edema appears slightly increased and more hypodense as compared to prior. Similar regional mass effect with 7 mm of right-to-left shift. No progressive hydrocephalus  or ventricular trapping.   Hypertensive emergency Resolved.  Bp parameters are well controlled.  She was weaned off Cleviprex. Continue with 10 mg of Norvasc, clonidine 0.1 mg 3 times prn,  Coreg 25 mg twice daily, spironolactone 96m daily and cozaar 100 mg daily. .Sarah KitchenNo changes in meds.     AKI Improvement in creatinine with IV fluids.  Suspect from dehydration.  Added free water to the tube feeds.    Elevated troponins Probably secondary to hypertensive emergency. Demand ischemia. EKG does not show any ischemic changes    Acute respiratory failure secondary to pulmonary edema Stridor due to upper airway edema Failed extubation twice finally underwent tracheostomy on 02/14/2022 Continue with pulmonary toilet Continue with trach collar.  SLP on board with PMSV trials. CT angio of the chest showed Bilateral pulmonary emboli as described above without evidence of right heart strain. Bilateral lower lobe consolidation with associated small effusions. Pt started on IV heparin for the pulmonary emboli, transitioned to eliquis without bolus.  Venous duplex ordered and done and negative for SVT or DVT.  She was also started on IV ancef for MSSA pneumonia. Complete the course of antibiotics BY 02/28/22.      Nutrition s/p cortrak and is on  tube feeds.    Urinary retention:  S/p In and out  twice in the last one week.  Unclear etiology. UA does not show any signs of infection.  If it happens again, will place foley catheter.    Hyponatremia:  Unclear etiology.  TSH wnl. SERUM osmo is 295, urine sodium is 77 and urine osmo is 484. Cortisol is 8.7.  Check BMP in am.     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 Subcutaneous fat may be visible but bone, tendon or muscle are NOT exposed. (Active)  02/26/22 0443  Location: Cervical  Location Orientation: Left  Staging: Stage 3 -  Full thickness tissue loss. Subcutaneous fat may be visible  but bone, tendon or muscle are NOT exposed.  Wound Description (Comments):   Present on Admission: No    Hypercalcemia:  Persistent, will get  INTACT PTH and calcium to check for hyperparathyroidism, .   Malnutrition Type:  Nutrition Problem: Inadequate oral intake Etiology: inability to eat   Malnutrition Characteristics:  Signs/Symptoms: NPO status   Nutrition Interventions:  Interventions: Tube feeding, Prostat  Estimated body mass index is 27.93 kg/m as calculated from the following:   Height as of this encounter: _0  (1.626 m).   Weight as of this encounter: 73.8 kg.  Code Status: full code.  DVT Prophylaxis:  SCD's Start: 02/05/22 0846 apixaban (ELIQUIS) tablet 5 mg   Level of Care: Level of care: Progressive Family Communication: none at bedside.   Disposition Plan:     Remains inpatient appropriate:  unsafe d/c plan.  Procedures:  CT angio of the chest.   Consultants:   Neurology pCCM.   Antimicrobials:   Anti-infectives (From admission, onward)    Start     Dose/Rate Route Frequency Ordered Stop   02/22/22 2200  ceFAZolin (ANCEF) IVPB 2g/100 mL premix        2 g 200 mL/hr over 30 Minutes Intravenous Every 8 hours 02/22/22 1101     02/22/22 1100  vancomycin (VANCOREADY) IVPB 1250 mg/250 mL  Status:  Discontinued        1,250 mg 166.7 mL/hr over 90 Minutes Intravenous Every 24 hours 02/21/22 1150 02/22/22 1059   02/21/22 1800  azithromycin (ZITHROMAX) 500 mg in sodium chloride 0.9 % 250 mL IVPB  Status:  Discontinued        500 mg 250 mL/hr over 60 Minutes Intravenous Every 24 hours 02/21/22 1701 02/22/22 1059   02/21/22 1000  vancomycin (VANCOREADY) IVPB 1750 mg/350 mL        1,750 mg 175 mL/hr over 120 Minutes Intravenous  Once 02/21/22 0900 02/21/22 1246   02/21/22 1000  ceFEPIme (MAXIPIME) 2 g in sodium chloride 0.9 % 100 mL IVPB  Status:  Discontinued        2 g 200 mL/hr over 30 Minutes Intravenous Every 12 hours 02/21/22 0903 02/22/22  1059   02/21/22 0945  cefTRIAXone (ROCEPHIN) 2 g in sodium chloride 0.9 % 100 mL IVPB  Status:  Discontinued        2 g 200 mL/hr over 30 Minutes Intravenous Every 24 hours 02/21/22 0850 02/21/22 0903   02/14/22 0945  ceFEPIme (MAXIPIME) 2 g in sodium chloride 0.9 % 100 mL IVPB        2 g 200 mL/hr over 30 Minutes Intravenous Every 8 hours 02/14/22 0845 02/16/22 0713   02/10/22 1400  vancomycin (VANCOCIN) IVPB 1000 mg/200 mL premix  Status:  Discontinued  1,000 mg 200 mL/hr over 60 Minutes Intravenous Every 24 hours 02/09/22 1214 02/10/22 0808   02/09/22 1300  vancomycin (VANCOREADY) IVPB 1500 mg/300 mL        1,500 mg 150 mL/hr over 120 Minutes Intravenous  Once 02/09/22 1214 02/09/22 1608   02/09/22 1245  ceFEPIme (MAXIPIME) 2 g in sodium chloride 0.9 % 100 mL IVPB  Status:  Discontinued        2 g 200 mL/hr over 30 Minutes Intravenous Every 12 hours 02/09/22 1145 02/14/22 0845        Medications  Scheduled Meds:  amLODipine  10 mg Per Tube Daily   apixaban  5 mg Oral BID   carvedilol  25 mg Per Tube BID WC   chlorhexidine gluconate (MEDLINE KIT)  15 mL Mouth Rinse BID   Chlorhexidine Gluconate Cloth  6 each Topical Q0600   clonazePAM  1.5 mg Per Tube BID   dextromethorphan  30 mg Per Tube BID   feeding supplement (PROSource TF)  45 mL Per Tube BID   free water  200 mL Per Tube Q8H   guaiFENesin  200 mg Per Tube Q6H   insulin aspart  0-15 Units Subcutaneous Q4H   losartan  100 mg Per Tube Daily   mouth rinse  15 mL Mouth Rinse 10 times per day   multivitamin with minerals  1 tablet Per Tube Daily   pantoprazole sodium  40 mg Per Tube Daily   QUEtiapine  50 mg Per Tube QHS   spironolactone  50 mg Per Tube Daily   Continuous Infusions:   ceFAZolin (ANCEF) IV Stopped (02/27/22 0515)   feeding supplement (JEVITY 1.5 CAL/FIBER) 1,000 mL (02/26/22 1730)   PRN Meds:.acetaminophen **OR** acetaminophen (TYLENOL) oral liquid 160 mg/5 mL **OR** acetaminophen, cloNIDine,  hydrALAZINE, labetalol, midazolam, oxyCODONE, senna-docusate    Subjective:   Sarah Myers was seen and examined today.  No new events overnight. Trach sutures removed last week and she is able to tolerate without any issues.  Objective:   Vitals:   02/27/22 0859 02/27/22 0900 02/27/22 1055 02/27/22 1137  BP:    120/80  Pulse: 81 81 72 69  Resp: (!) 22 (!) 22 (!) 24 20  Temp:    98 F (36.7 C)  TempSrc:    Oral  SpO2: 96% 96% 97% 100%  Weight:      Height:        Intake/Output Summary (Last 24 hours) at 02/27/2022 1227 Last data filed at 02/27/2022 1148 Gross per 24 hour  Intake 300 ml  Output 500 ml  Net -200 ml    Filed Weights   02/25/22 0537 02/26/22 0641 02/27/22 0500  Weight: 74.5 kg 74.5 kg 73.8 kg     Exam General exam: Appears calm and comfortable s/p cortrak Respiratory system: diminished air entry. S/p trach on 5 lit of Oxygen.  Cardiovascular system: S1 & S2 heard, RRR. No JVD No pedal edema. Gastrointestinal system: Abdomen is nondistended, soft and nontender.  Normal bowel sounds heard. Central nervous system: somnolent, not following commands.  Extremities: Symmetric 5 x 5 power. Skin: No rashes seen. Psychiatry:  unable to assess.      Data Reviewed:  I have personally reviewed following labs and imaging studies   CBC Lab Results  Component Value Date   WBC 6.5 02/26/2022   RBC 3.61 (L) 02/26/2022   HGB 10.3 (L) 02/26/2022   HCT 32.3 (L) 02/26/2022   MCV 89.5 02/26/2022   MCH 28.5 02/26/2022  PLT 209 02/26/2022   MCHC 31.9 02/26/2022   RDW 13.8 02/26/2022   LYMPHSABS 1.6 02/23/2022   MONOABS 0.6 02/23/2022   EOSABS 0.3 02/23/2022   BASOSABS 0.1 37/85/8850     Last metabolic panel Lab Results  Component Value Date   NA 133 (L) 02/27/2022   K 4.6 02/27/2022   CL 102 02/27/2022   CO2 23 02/27/2022   BUN 34 (H) 02/27/2022   CREATININE 0.94 02/27/2022   GLUCOSE 123 (H) 02/27/2022   GFRNONAA >60 02/27/2022   GFRAA 119  08/18/2020   CALCIUM 11.5 (H) 02/27/2022   PHOS 3.1 02/08/2022   PROT 6.8 02/19/2022   ALBUMIN 2.6 (L) 02/19/2022   LABGLOB 3.5 06/29/2020   AGRATIO 1.3 06/29/2020   BILITOT 0.3 02/19/2022   ALKPHOS 54 02/19/2022   AST 34 02/19/2022   ALT 54 (H) 02/19/2022   ANIONGAP 8 02/27/2022    CBG (last 3)  Recent Labs    02/27/22 0331 02/27/22 0820 02/27/22 1140  GLUCAP 130* 128* 173*       Coagulation Profile: No results for input(s): INR, PROTIME in the last 168 hours.   Radiology Studies: CT HEAD WO CONTRAST (5MM)  Result Date: 02/26/2022 CLINICAL DATA:  Follow-up examination for intracranial hemorrhage, stroke. EXAM: CT HEAD WITHOUT CONTRAST TECHNIQUE: Contiguous axial images were obtained from the base of the skull through the vertex without intravenous contrast. RADIATION DOSE REDUCTION: This exam was performed according to the departmental dose-optimization program which includes automated exposure control, adjustment of the mA and/or kV according to patient size and/or use of iterative reconstruction technique. COMPARISON:  Prior CT from 02/22/2022. FINDINGS: Brain: Previously identified intraparenchymal hematoma centered at the right lentiform nucleus again seen. Hematoma is slightly decreased in size from previous, now measuring 3.7 x 2.3 x 1.8 cm. Surrounding vasogenic edema appears slightly increased and more hypodense as compared to prior. Similar regional mass effect with partial effacement of the right lateral ventricle and up to 7 mm of right-to-left shift. No progressive hydrocephalus or ventricular trapping. Basilar cisterns remain patent. Probable evolving changes of wallerian degeneration at the right cerebral peduncle. No new intracranial hemorrhage or large vessel territory infarct. No mass lesion or extra-axial fluid collection. Vascular: No hyperdense vessel. Skull: Scalp soft tissues and calvarium demonstrate no new finding. Sinuses/Orbits: Globes orbital soft tissues  within normal limits. Nasogastric tube in place. Sphenoid ethmoidal sinus disease noted. Right mastoid and middle ear effusion, with trace left mastoid effusion. Appearance is similar. Other: None. IMPRESSION: 1. Continued normal expected interval evolution of intraparenchymal hematoma centered at the right lentiform nucleus, slightly decreased in size now measuring 3.7 x 2.3 x 1.8 cm. Surrounding vasogenic edema appears slightly increased and more hypodense as compared to prior. Similar regional mass effect with 7 mm of right-to-left shift. No progressive hydrocephalus or ventricular trapping. 2. No other new acute intracranial abnormality. Electronically Signed   By: Jeannine Boga M.D.   On: 02/26/2022 19:26       Hosie Poisson M.D. [facility] 02/27/2022, 12:27 PM  Available via Epic secure chat 7am-7pm After 7 pm, please refer to night coverage provider listed on amion.

## 2022-02-27 NOTE — Progress Notes (Signed)
Physical Therapy Treatment ?Patient Details ?Name: Sarah Myers ?MRN: [id number] ?DOB: Aug 13, 1971 ?Today's Date: 02/27/2022 ? ? ?History of Present Illness 51 y.o. female presents to [facility] hospital on 02/05/2022 with L hemiplegia and dysarthria. 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 Pt intubated 2/19. Attempted extubation 2/23 and 2/26, requiring reintubation same date. Tracheostomy 02/14/2022. PMH includes uncontrolled HTN ? ?  ?PT Comments  ? ? Pt was slow to progress toward goals.  She showed little focus and little ability to react purposefully to perturbation while working on sitting balance at EOB for ~20 minutes ?   ?Recommendations for follow up therapy are one component of a multi-disciplinary discharge planning process, led by the attending physician.  Recommendations may be updated based on patient status, additional functional criteria and insurance authorization. ? ?Follow Up Recommendations ? Skilled nursing-short term rehab (<3 hours/day) ?  ?  ?Assistance Recommended at Discharge Frequent or constant Supervision/Assistance  ?Patient can return home with the following Two people to help with walking and/or transfers;Two people to help with bathing/dressing/bathroom;Assistance with cooking/housework;Assistance with feeding;Direct supervision/assist for financial management;Direct supervision/assist for medications management;Help with stairs or ramp for entrance;Assist for transportation ?  ?Equipment Recommendations ? Wheelchair (measurements PT);Wheelchair cushion (measurements PT);Hospital bed;Other (comment)  ?  ?Recommendations for Other Services Rehab consult ? ? ?  ?Precautions / Restrictions Precautions ?Precautions: Fall ?Precaution Comments: L neglect ?Restrictions ?Weight Bearing Restrictions: No  ?  ? ?Mobility ? Bed Mobility ?Overal bed mobility: Needs Assistance ?Bed Mobility: Supine to Sit, Sit to Supine ?  ?  ?Supine to sit: Total assist, +2 for  physical assistance, +2 for safety/equipment ?Sit to supine: Total assist, +2 for physical assistance, +2 for safety/equipment ?  ?General bed mobility comments: Assist for all aspects ?  ? ?Transfers ?  ?  ?  ?  ?  ?  ?  ?  ?  ?  ?  ? ?Ambulation/Gait ?  ?  ?  ?  ?  ?  ?  ?  ? ? ?Stairs ?  ?  ?  ?  ?  ? ? ?Wheelchair Mobility ?  ? ?Modified Rankin (Stroke Patients Only) ?Modified Rankin (Stroke Patients Only) ?Modified Rankin: Severe disability ? ? ?  ?Balance Overall balance assessment: Needs assistance ?Sitting-balance support: Feet supported, Bilateral upper extremity supported ?Sitting balance-Leahy Scale: Zero (zero working towards poor) ?Sitting balance - Comments: Max A with intermittent min A sitting EOB for ~20 minutes ?  ?  ?  ?  ?  ?  ?  ?  ?  ?  ?  ?  ?  ?  ?  ?  ? ?  ?Cognition Arousal/Alertness: Lethargic ?Behavior During Therapy: Flat affect ?Overall Cognitive Status: Difficult to assess ?Area of Impairment: Attention, Following commands, Safety/judgement ?  ?  ?  ?  ?  ?  ?  ?  ?  ?Current Attention Level: Focused ?  ?Following Commands: Follows one step commands inconsistently ?Safety/Judgement: Decreased awareness of safety ?  ?Problem Solving: Slow processing ?General Comments: difficult to assess due to impairments listed below. Pt not shaking head yes/no this date. follow ~10% of commands. Eyes open 90% of session with R gaze; not tracking. Waved "bye" to therapist at the end of the session. ?  ?  ? ?  ?Exercises   ? ?  ?General Comments General comments (skin integrity, edema, etc.): VSS on on 28% FIO2 ?  ?  ? ?Pertinent Vitals/Pain Pain Assessment ?Pain  Assessment: Faces ?Faces Pain Scale: No hurt ?Pain Intervention(s): Monitored during session  ? ? ?Home Living   ?  ?  ?  ?  ?  ?  ?  ?  ?  ?   ?  ?Prior Function    ?  ?  ?   ? ?PT Goals (current goals can now be found in the care plan section) Acute Rehab PT Goals ?Time For Goal Achievement: 03/01/22 ?Potential to Achieve Goals:  Fair ?Progress towards PT goals: Progressing toward goals;Not progressing toward goals - comment ? ?  ?Frequency ? ? ? Min 3X/week ? ? ? ?  ?PT Plan Current plan remains appropriate  ? ? ?Co-evaluation PT/OT/SLP Co-Evaluation/Treatment: Yes ?Reason for Co-Treatment: Complexity of the patient's impairments (multi-system involvement) ?PT goals addressed during session: Mobility/safety with mobility ?OT goals addressed during session: ADL's and self-care;Strengthening/ROM ?  ? ?  ?AM-PAC PT "6 Clicks" Mobility   ?Outcome Measure ? Help needed turning from your back to your side while in a flat bed without using bedrails?: Total ?Help needed moving from lying on your back to sitting on the side of a flat bed without using bedrails?: Total ?Help needed moving to and from a bed to a chair (including a wheelchair)?: Total ?Help needed standing up from a chair using your arms (e.g., wheelchair or bedside chair)?: Total ?Help needed to walk in hospital room?: Total ?Help needed climbing 3-5 steps with a railing? : Total ?6 Click Score: 6 ? ?  ?End of Session Equipment Utilized During Treatment: Oxygen ?Activity Tolerance: Patient limited by fatigue ?Patient left: in bed;with call bell/phone within reach;with bed alarm set;with family/visitor present ?Nurse Communication: Mobility status ?PT Visit Diagnosis: Other abnormalities of gait and mobility (R26.89);Hemiplegia and hemiparesis ?Hemiplegia - Right/Left: Left ?Hemiplegia - dominant/non-dominant: Dominant ?Hemiplegia - caused by: Nontraumatic intracerebral hemorrhage ?  ? ? ?Time: [id number] ?PT Time Calculation (min) (ACUTE ONLY): 32 min ? ?Charges:  $Neuromuscular Re-education: 8-22 mins          ?          ? ?02/27/2022 ? ?Ginger Carne., PT ?Acute Rehabilitation Services ?[phone]  (pager) ?[phone]  (office) ? ? ?Tessie Fass Georgia Baria ?02/27/2022, 6:33 PM ? ?

## 2022-02-27 NOTE — Progress Notes (Signed)
Nutrition Follow-up ? ?DOCUMENTATION CODES:  ?Not applicable ? ?INTERVENTION:  ?Continue TF via Cortrak:  ?-Jevity 1.5 @ 55 ml/h (1320 ml per day) ?-ProSource TF to 45 ml BID ?-Free water per MD, currently 232m Q8H ?  ?Provides 2060 kcal, 105 gm protein, 1006 ml free water daily (16035mtotal free water) ?  ?-MVI daily per tube ?-1 packet Juven BID per tube, each packet provides 95 calories, 2.5 grams of protein (collagen), and 9.8 grams of carbohydrate (3 grams sugar); also contains 7 grams of L-arginine and L-glutamine, 300 mg vitamin C, 15 mg vitamin E, 1.2 mcg vitamin B-12, 9.5 mg zinc, 200 mg calcium, and 1.5 g  Calcium Beta-hydroxy-Beta-methylbutyrate to support wound healing ? ?NUTRITION DIAGNOSIS:  ?Inadequate oral intake related to inability to eat as evidenced by NPO status. ?Ongoing.  ? ?GOAL:  ?Patient will meet greater than or equal to 90% of their needs ?Met with TF at goal.  ? ?MONITOR:  ?TF tolerance, I & O's ? ?REASON FOR ASSESSMENT:  ?Consult ?Enteral/tube feeding initiation and management ? ?ASSESSMENT:  ?Pt with PMH of HTN admitted with acute R ICH  and midline shift. 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 ?  ?2/24- cortrak placed (gastric)  ?2/28- s/p trach ?3/01- trach collar  ?3/04- taken off vent ?3/06- tx to floor   ? ?Pt sleeping at time of RD visit. Pt remains on TC receiving TF via Cortrak. Current TF orders are Jevity 1.5 @ 5513mr, 38m64mosource TF BID and 200ml49me water Q8H. Toleratng TF per RN. ? ?UOP: 500ml 31mhours ?I/O: +3506ml s21m admit ? ?Admit weight 74.8 kg ?Current wt 73.8 kg ? ?Medications: SSI Q4H, mvi w/ minerals, protonix, aldactone, IV abx ?Labs: Na 133 (L), Ionized Ca 6.6 (H) ?CBGs: 128-173 x 24 hours ? ?Diet Order:   ?Diet Order   ? ? None  ? ?  ? ?EDUCATION NEEDS:  ?Not appropriate for education at this time ? ?Skin: stg III pressure injury noted to L cervical per RN assessment ? ?Last BM:  3/13 ? ?Height:  ?Ht Readings from Last 1  Encounters:  ?02/05/22 '5\' 4"'  (1.626 m)  ? ?Weight:  ?Wt Readings from Last 1 Encounters:  ?02/27/22 73.8 kg  ? ?BMI:  Body mass index is 27.93 kg/m?. ? ?Estimated Nutritional Needs:  ?Kcal:  1900-2100 ?Protein:  105-120 grams ?Fluid:  > 1.9 L ? ? ? ?Nassir Neidert Theone StanleyRD, LDN (she/her/hers) ?RD pager number and weekend/on-call pager number located in Amion. [place]

## 2022-02-27 NOTE — Discharge Instructions (Signed)

## 2022-02-28 ENCOUNTER — Other Ambulatory Visit (HOSPITAL_COMMUNITY): Payer: Self-pay

## 2022-02-28 LAB — GLUCOSE, CAPILLARY
Glucose-Capillary: 122 mg/dL — ABNORMAL HIGH (ref 70–99)
Glucose-Capillary: 138 mg/dL — ABNORMAL HIGH (ref 70–99)
Glucose-Capillary: 126 mg/dL — ABNORMAL HIGH (ref 70–99)
Glucose-Capillary: 106 mg/dL — ABNORMAL HIGH (ref 70–99)
Glucose-Capillary: 133 mg/dL — ABNORMAL HIGH (ref 70–99)
Glucose-Capillary: 106 mg/dL — ABNORMAL HIGH (ref 70–99)

## 2022-02-28 LAB — BASIC METABOLIC PANEL
Anion gap: 7 (ref 5–15)
BUN: 39 mg/dL — ABNORMAL HIGH (ref 6–20)
CO2: 24 mmol/L (ref 22–32)
Calcium: 11.1 mg/dL — ABNORMAL HIGH (ref 8.9–10.3)
Chloride: 101 mmol/L (ref 98–111)
Creatinine, Ser: 0.98 mg/dL (ref 0.44–1.00)
GFR, Estimated: >60 mL/min (ref >60)
Glucose, Bld: 141 mg/dL — ABNORMAL HIGH (ref 70–99)
Potassium: 4.6 mmol/L (ref 3.5–5.1)
Sodium: 132 mmol/L — ABNORMAL LOW (ref 135–145)

## 2022-02-28 MED ORDER — APIXABAN 5 MG PO TABS
5.0000 mg | ORAL_TABLET | Freq: Two times a day (BID) | ORAL | Status: DC
  Administered 2022-02-28: 5 mg
  Filled 2022-02-28 (×3): qty 1

## 2022-02-28 NOTE — Progress Notes (Signed)
Physical Therapy Treatment ?Patient Details ?Name: Sarah Myers ?MRN: [id number] ?DOB: Jul 05, 1971 ?Today's Date: 02/28/2022 ? ? ?History of Present Illness 51 y.o. female presents to [facility] hospital on 02/05/2022 with L hemiplegia and dysarthria. 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 Pt intubated 2/19. Attempted extubation 2/23 and 2/26, requiring reintubation same date. Tracheostomy 02/14/2022. PMH includes uncontrolled HTN ? ?  ?PT Comments  ? ? Pt was seen for mobility on side of bed to balance and work on core/head control.  Family there to offer verbal and tactile support of pt, to make her more alert and to encourage her.  Pt is demonstrating a better ability to lift her head and then approximates her mouth better.  Follow along with her to work on balance, midline control and awareness, head control and eventually standing transfers.  Family will be very supportive of all her needs and transition to rehab.  SNF recommended due to length of time to recover PLOF that is needed, and emerging awareness and control of mm's.   ?Recommendations for follow up therapy are one component of a multi-disciplinary discharge planning process, led by the attending physician.  Recommendations may be updated based on patient status, additional functional criteria and insurance authorization. ? ?Follow Up Recommendations ? Skilled nursing-short term rehab (<3 hours/day) ?  ?  ?Assistance Recommended at Discharge Frequent or constant Supervision/Assistance  ?Patient can return home with the following Two people to help with walking and/or transfers;Two people to help with bathing/dressing/bathroom;Assistance with feeding;Direct supervision/assist for medications management;Direct supervision/assist for financial management;Assist for transportation ?  ?Equipment Recommendations ? None recommended by PT  ?  ?Recommendations for Other Services   ? ? ?  ?Precautions / Restrictions  Precautions ?Precautions: Fall ?Precaution Comments: L neglect ?Restrictions ?Weight Bearing Restrictions: No  ?  ? ?Mobility ? Bed Mobility ?Overal bed mobility: Needs Assistance ?Bed Mobility: Supine to Sit, Sit to Supine ?Rolling: Max assist ?  ?Supine to sit: Max assist ?Sit to supine: Max assist ?  ?General bed mobility comments: max for all and when getting bed and pt changed required two person help ?  ? ?Transfers ?  ?  ?  ?  ?  ?  ?  ?  ?  ?General transfer comment: unable to attempt yet ?  ? ?Ambulation/Gait ?  ?  ?  ?  ?  ?  ?  ?  ? ? ?Stairs ?  ?  ?  ?  ?  ? ? ?Wheelchair Mobility ?  ? ?Modified Rankin (Stroke Patients Only) ?  ? ? ?  ?Balance Overall balance assessment: Needs assistance ?Sitting-balance support: Feet supported, Bilateral upper extremity supported ?Sitting balance-Leahy Scale: Zero ?  ?  ?  ?  ?  ?  ?  ?  ?  ?  ?  ?  ?  ?  ?  ?  ?  ? ?  ?Cognition Arousal/Alertness: Lethargic ?Behavior During Therapy: Flat affect ?Overall Cognitive Status: Difficult to assess ?Area of Impairment: Following commands, Attention, Orientation ?  ?  ?  ?  ?  ?  ?  ?  ?Orientation Level: Place ?Current Attention Level: Selective ?  ?Following Commands: Follows one step commands with increased time ?Safety/Judgement: Decreased awareness of deficits ?  ?Problem Solving: Slow processing, Requires tactile cues, Requires verbal cues ?General Comments: pt is sleepy during session but kept eyes open for the majority of it ?  ?  ? ?  ?Exercises General Exercises -  Lower Extremity ?Ankle Circles/Pumps: AAROM, Both ?Long Arc Quad: AAROM, Both ? ?  ?General Comments General comments (skin integrity, edema, etc.): Pt was able to assist with some head control and some trunk control, and was tolerant of PT placing L hand palm down to support on elbow with help ?  ?  ? ?Pertinent Vitals/Pain Pain Assessment ?Pain Assessment: Faces ?Faces Pain Scale: Hurts a little bit ?Facial Expression: smiling or inexpressive ?Body  Language: relaxed ?Consolability: no need to console ?Facial Expression: Tense ?Pain Location: general movement to the side of bed ?Pain Descriptors / Indicators: Guarding ?Pain Intervention(s): Monitored during session, Repositioned  ? ? ?Home Living   ?  ?  ?  ?  ?  ?  ?  ?  ?  ?   ?  ?Prior Function    ?  ?  ?   ? ?PT Goals (current goals can now be found in the care plan section) Acute Rehab PT Goals ?Patient Stated Goal: pt was not verbal ?Progress towards PT goals: Progressing toward goals ? ?  ?Frequency ? ? ? Min 3X/week ? ? ? ?  ?PT Plan Current plan remains appropriate  ? ? ?Co-evaluation   ?  ?  ?  ?  ? ?  ?AM-PAC PT "6 Clicks" Mobility   ?Outcome Measure ? Help needed turning from your back to your side while in a flat bed without using bedrails?: Total ?Help needed moving from lying on your back to sitting on the side of a flat bed without using bedrails?: Total ?Help needed moving to and from a bed to a chair (including a wheelchair)?: Total ?Help needed standing up from a chair using your arms (e.g., wheelchair or bedside chair)?: Total ?Help needed to walk in hospital room?: Total ?Help needed climbing 3-5 steps with a railing? : Total ?6 Click Score: 6 ? ?  ?End of Session Equipment Utilized During Treatment: Oxygen ?Activity Tolerance: Patient limited by fatigue ?Patient left: in bed;with call bell/phone within reach;with bed alarm set;with family/visitor present ?Nurse Communication: Mobility status ?PT Visit Diagnosis: Other abnormalities of gait and mobility (R26.89);Hemiplegia and hemiparesis ?Hemiplegia - Right/Left: Left ?Hemiplegia - dominant/non-dominant: Dominant ?Hemiplegia - caused by: Nontraumatic intracerebral hemorrhage ?  ? ? ?Time: [id number] ?PT Time Calculation (min) (ACUTE ONLY): 38 min ? ?Charges:  $Therapeutic Activity: 23-37 mins ?$Neuromuscular Re-education: 8-22 mins   ?Ramond Dial ?02/28/2022, 4:22 PM ? ?Mee Hives, PT PhD ?Acute Rehab Dept. Number: [facility] [id number] and [place]  [phone] ? ? ?

## 2022-02-28 NOTE — TOC Benefit Eligibility Note (Signed)
Patient Advocate Encounter  Insurance verification completed.    The patient is currently admitted and upon discharge could be taking Eliquis 5 mg.  The current 30 day co-pay is, $15.00.   The patient is insured through [employer]     Eloise Mula, CPhT Pharmacy Patient Advocate Specialist [facility] Patient Advocate Team Direct Number: [phone]  Fax: [phone]        

## 2022-02-28 NOTE — Progress Notes (Signed)
````````````````     ? ? ? [facility] ?                                                                            ? ? ?Sarah Myers, is a 51 y.o. female, DOB - 10/02/1971, [id number] ?Admit date - 02/05/2022    ?Outpatient Primary MD for the patient is [name], MD ? ?LOS - 23  days ? ? ? ?Brief summary  ? ?52 y/o female came to the [facility] ER on 2/19 with left sided weakness and slurred speech. Noted to have a right ICH with brain compression present on admission. Patient was admitted to neuro ICU, received 3% saline, cleviprex, and eventually intubated on 2/19.  Patient failed extubation x2, and subsequently had a tracheostomy on 2/28, trach collar placed on 3/1, was eventually taken off vent on 3/4.   [facility] assumed care on 02/20/2022.  ?Hospital course complicated by MSSA pneumonia. She was started on ANCEF to complete the course by 02/28/22.SLP following.  ? ? ? ?Assessment & Plan  ? ? ?Assessment and Plan: ? ?Intracerebral hemorrhage ?Acute right large basal ganglion ICH probably secondary to hypertensive emergency. ?CT head showed acute right lentiform hemorrhage with mass effect, 4 to 5 mm leftward midline shift. ?Repeat CT on 2/26 shows stable hematoma. ?Echocardiogram showed left ventricular ejection fraction of 65 to 70%. ?Neurology on board and appreciate recommendations. ?Repeat CT head without contrast on the morning of 3/8 showed Expected regression of right ICH.  Residual hematoma and swelling causes 6 mm of midline shift. Small left occipital cortex infarcts which have become apparent by CT. ?Repeat CT on 3/12 shows Continued normal expected interval evolution of intraparenchymal ?hematoma centered at the right lentiform nucleus, slightly decreased in size now measuring 3.7 x 2.3 x 1.8 cm. Surrounding vasogenic edema appears slightly increased and more hypodense as compared to prior. Similar regional mass effect with 7 mm of right-to-left shift. No progressive hydrocephalus or  ventricular trapping. ?No change in neurological status in the last one week.  ? ? ?Hypertensive emergency ?Resolved.  ?Bp parameters are optimal.  ?She was weaned off Cleviprex. ?Continue with 10 mg of Norvasc, clonidine 0.1 mg 3 times prn,  Coreg 25 mg twice daily, spironolactone 53m daily and cozaar 100 mg daily. .Marland Kitchen?No changes in meds.  ? ? ? ?AKI ?Improvement in creatinine with IV fluids.  ?Suspect from dehydration.  ?Added free water to the tube feeds.  ? ? ?Elevated troponins ?Probably secondary to hypertensive emergency. ?Demand ischemia. ?EKG does not show any ischemic changes ? ? ? ?Acute respiratory failure secondary to pulmonary edema ?Stridor due to upper airway edema ?Failed extubation twice finally underwent tracheostomy on 02/14/2022 ?Continue with pulmonary toilet ?Continue with trach collar.  SLP on board with PMSV trials. ?CT angio of the chest showed Bilateral pulmonary emboli as described above without evidence of ?right heart strain. Bilateral lower lobe consolidation with associated small effusions. ?Pt started on IV heparin for the pulmonary emboli, transitioned to eliquis without bolus.  ?Venous duplex ordered and done and negative for SVT or DVT.  ?She was also started on IV ancef for MSSA pneumonia. Complete the course of antibiotics BY 02/28/22.  ? ? ? ? ?  Nutrition s/p cortrak and is on tube feeds.  ? ? ?Urinary retention:  ?S/p In and out  twice in the last one week.  ?Unclear etiology. UA does not show any signs of infection.  ?If it happens again, will place foley catheter.  ? ? ?Hyponatremia:  ?Unclear etiology. Sodium has improved to 132.  ?TSH wnl. SERUM osmo is 295, urine sodium is 77 and urine osmo is 484. Cortisol is 8.7.  ?Check BMP in am.  ? ? ? 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 Subcutaneous fat may be visible but bone, tendon or muscle are NOT exposed. (Active)  ?02/26/22 0443  ?Location: Cervical  ?Location Orientation: Left   ?Staging: Stage 3 -  Full thickness tissue loss. Subcutaneous fat may be visible but bone, tendon or muscle are NOT exposed.  ?Wound Description (Comments):   ?Present on Admission: No  ? ? ?Hypercalcemia:  ?Persistent,  ?Possibly from immobility. PTH is wnl.  ?Corrected calcium level is 12.  ?I ordered PTHrP.  ? ?Malnutrition Type: ? ?Nutrition Problem: Inadequate oral intake ?Etiology: inability to eat ? ? ?Malnutrition Characteristics: ? ?Signs/Symptoms: NPO status ? ? ?Nutrition Interventions: ? ?Interventions: Tube feeding, Prostat ? ?Estimated body mass index is 28 kg/m? as calculated from the following: ?  Height as of this encounter: _0  (1.626 m). ?  Weight as of this encounter: 74 kg. ? ?Code Status: full code.  ?DVT Prophylaxis:  SCD's Start: 02/05/22 0846 ?apixaban (ELIQUIS) tablet 5 mg  ? ?Level of Care: Level of care: Progressive ?Family Communication: none at bedside.  ? ?Disposition Plan:     Remains inpatient appropriate:  unsafe d/c plan. ? ?Procedures:  ?CT angio of the chest.  ? ?Consultants:   ?Neurology ?pCCM. ? ? ?Antimicrobials:  ? ?Anti-infectives (From admission, onward)  ? ? Start     Dose/Rate Route Frequency Ordered Stop  ? 02/22/22 2200  ceFAZolin (ANCEF) IVPB 2g/100 mL premix       ? 2 g ?200 mL/hr over 30 Minutes Intravenous Every 8 hours 02/22/22 1101    ? 02/22/22 1100  vancomycin (VANCOREADY) IVPB 1250 mg/250 mL  Status:  Discontinued       ? 1,250 mg ?166.7 mL/hr over 90 Minutes Intravenous Every 24 hours 02/21/22 1150 02/22/22 1059  ? 02/21/22 1800  azithromycin (ZITHROMAX) 500 mg in sodium chloride 0.9 % 250 mL IVPB  Status:  Discontinued       ? 500 mg ?250 mL/hr over 60 Minutes Intravenous Every 24 hours 02/21/22 1701 02/22/22 1059  ? 02/21/22 1000  vancomycin (VANCOREADY) IVPB 1750 mg/350 mL       ? 1,750 mg ?175 mL/hr over 120 Minutes Intravenous  Once 02/21/22 0900 02/21/22 1246  ? 02/21/22 1000  ceFEPIme (MAXIPIME) 2 g in sodium chloride 0.9 % 100 mL IVPB  Status:   Discontinued       ? 2 g ?200 mL/hr over 30 Minutes Intravenous Every 12 hours 02/21/22 0903 02/22/22 1059  ? 02/21/22 0945  cefTRIAXone (ROCEPHIN) 2 g in sodium chloride 0.9 % 100 mL IVPB  Status:  Discontinued       ? 2 g ?200 mL/hr over 30 Minutes Intravenous Every 24 hours 02/21/22 0850 02/21/22 0903  ? 02/14/22 0945  ceFEPIme (MAXIPIME) 2 g in sodium chloride 0.9 % 100 mL IVPB       ? 2 g ?200 mL/hr over 30 Minutes Intravenous Every 8 hours 02/14/22 0845 02/16/22 0713  ?  02/10/22 1400  vancomycin (VANCOCIN) IVPB 1000 mg/200 mL premix  Status:  Discontinued       ? 1,000 mg ?200 mL/hr over 60 Minutes Intravenous Every 24 hours 02/09/22 1214 02/10/22 0808  ? 02/09/22 1300  vancomycin (VANCOREADY) IVPB 1500 mg/300 mL       ? 1,500 mg ?150 mL/hr over 120 Minutes Intravenous  Once 02/09/22 1214 02/09/22 1608  ? 02/09/22 1245  ceFEPIme (MAXIPIME) 2 g in sodium chloride 0.9 % 100 mL IVPB  Status:  Discontinued       ? 2 g ?200 mL/hr over 30 Minutes Intravenous Every 12 hours 02/09/22 1145 02/14/22 0845  ? ?  ? ? ? ?Medications ? ?Scheduled Meds: ? amLODipine  10 mg Per Tube Daily  ? apixaban  5 mg Per Tube BID  ? carvedilol  25 mg Per Tube BID WC  ? chlorhexidine gluconate (MEDLINE KIT)  15 mL Mouth Rinse BID  ? Chlorhexidine Gluconate Cloth  6 each Topical Q0600  ? clonazePAM  1.5 mg Per Tube BID  ? feeding supplement (PROSource TF)  45 mL Per Tube BID  ? free water  200 mL Per Tube Q8H  ? guaiFENesin  200 mg Per Tube Q6H  ? insulin aspart  0-15 Units Subcutaneous Q4H  ? losartan  100 mg Per Tube Daily  ? mouth rinse  15 mL Mouth Rinse 10 times per day  ? multivitamin with minerals  1 tablet Per Tube Daily  ? nutrition supplement (JUVEN)  1 packet Per Tube BID BM  ? pantoprazole sodium  40 mg Per Tube Daily  ? QUEtiapine  50 mg Per Tube QHS  ? spironolactone  50 mg Per Tube Daily  ? ?Continuous Infusions: ?  ceFAZolin (ANCEF) IV 2 g (02/28/22 1356)  ? feeding supplement (JEVITY 1.5 CAL/FIBER) 1,000 mL (02/26/22 1730)   ? ?PRN Meds:.acetaminophen **OR** acetaminophen (TYLENOL) oral liquid 160 mg/5 mL **OR** acetaminophen, cloNIDine, hydrALAZINE, labetalol, midazolam, oxyCODONE, senna-docusate ? ? ? ?Subjective:  ? ?[employer]

## 2022-02-28 NOTE — Progress Notes (Signed)
Speech Language Pathology Treatment: [name] Speaking valve  ?Patient Details ?Name: Sarah Myers ?MRN: [id number] ?DOB: 1971-07-16 ?Today's Date: 02/28/2022 ?Time: 1040-1100 ?SLP Time Calculation (min) (ACUTE ONLY): 20 min ? ?Assessment / Plan / Recommendation ?Clinical Impression ? Patient seen by SLP for skilled treatment focused on PMV toleration and education of daughter who was present in room. SLP provided education to daughter regarding trach, cuff on trach via explanation, visual aids to show her what trach and cuff look like and their function. Daughter was asking appropriate questions such as what the length of the trach was, whether her Mom would need another surgery when they downsize to different trach size, etc. SLP performed oral care on patient but she kept teeth clenched shut and so SLP unable to fully perform oral care. Patient grimaced and turned away when SLP wiping face with cold wash cloth but would not open eyes. SLP donned PMV three times for no more than 5-7 seconds each time. No changes in vitals observed and no observed change in patient's facial expressions however when PMV removed, evidence back pressure each time.  ?SLP continues to recommend downsize of trach at least to cuffless. SLP will continue to follow patient for PMV toleration and ability to trial PO's.  ? ?  ?HPI HPI: 51 y/o female came to the [facility] ER on 2/19 with left sided weakness and slurred speech.  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 Intubated 2/19. Tracheostomy 2/28 (shiley 6, cuffed). ?  ?   ?SLP Plan ? Continue with current plan of care ? ?  ?  ?Recommendations for follow up therapy are one component of a multi-disciplinary discharge planning process, led by the attending physician.  Recommendations may be updated based on patient status, additional functional criteria and insurance authorization. ?  ? ?Recommendations  ?   ?   ? Patient may use Passy-Muir Speech Valve:  with SLP only ?PMSV Supervision: Full ?MD: Please consider changing trach tube to : Cuffless  ?   ? ? ? ? General recommendations: Rehab consult ?Oral Care Recommendations: Oral care QID;Staff/trained caregiver to provide oral care ?Follow Up Recommendations: Acute inpatient rehab (3hours/day) ?Assistance recommended at discharge: Frequent or constant Supervision/Assistance ?SLP Visit Diagnosis: Aphonia (R49.1) ?Plan: Continue with current plan of care ? ? ? ? ?  ?  ? ? ?Angela Nevin, MA, CCC-SLP ?Speech Therapy ? ?

## 2022-03-01 DIAGNOSIS — Z930 Tracheostomy status: Secondary | ICD-10-CM

## 2022-03-01 LAB — APTT: aPTT: 85 seconds — ABNORMAL HIGH (ref 24–36)

## 2022-03-01 LAB — GLUCOSE, CAPILLARY
Glucose-Capillary: 120 mg/dL — ABNORMAL HIGH (ref 70–99)
Glucose-Capillary: 131 mg/dL — ABNORMAL HIGH (ref 70–99)
Glucose-Capillary: 133 mg/dL — ABNORMAL HIGH (ref 70–99)
Glucose-Capillary: 135 mg/dL — ABNORMAL HIGH (ref 70–99)
Glucose-Capillary: 144 mg/dL — ABNORMAL HIGH (ref 70–99)
Glucose-Capillary: 145 mg/dL — ABNORMAL HIGH (ref 70–99)
Glucose-Capillary: 160 mg/dL — ABNORMAL HIGH (ref 70–99)

## 2022-03-01 MED ORDER — HEPARIN (PORCINE) 25000 UT/250ML-% IV SOLN
1100.0000 [IU]/h | INTRAVENOUS | Status: DC
  Administered 2022-03-01: 1300 [IU]/h via INTRAVENOUS
  Administered 2022-03-02: 1250 [IU]/h via INTRAVENOUS
  Administered 2022-03-03: 1100 [IU]/h via INTRAVENOUS
  Filled 2022-03-01 (×4): qty 250

## 2022-03-01 NOTE — Progress Notes (Signed)
ANTICOAGULATION CONSULT NOTE - Follow Up Consult ? ?Pharmacy Consult for Apixaban to Heparin ?Indication: pulmonary embolus / CVA ? ?Allergies  ?Allergen Reactions  ? Lisinopril Cough  ? ? ?Patient Measurements: ?Height: 5\' 4"  (162.6 cm) ?Weight: 71.9 kg (158 lb 8.2 oz) ?IBW/kg (Calculated) : 54.7 ?Heparin Dosing Weight: 70 kg ? ?Vital Signs: ?Temp: 98.6 ?F (37 ?C) (03/15 1111) ?Temp Source: (P) Axillary (03/15 1111) ?BP: 107/66 (03/15 1527) ?Pulse Rate: 83 (03/15 1527) ? ?Labs: ?Recent Labs  ?  02/27/22 ?0232 02/28/22 ?0306 03/01/22 ?1802  ?APTT  --   --  85*  ?CREATININE 0.94 0.98  --   ? ? ?Estimated Creatinine Clearance: 67.5 mL/min (by C-G formula based on SCr of 0.98 mg/dL). ? ?Assessment: ?80 YOF with bilateral PE on CTA without RHS. Of note, patient was initially admitted 2/19 with ICH - now stable on follow-up imaging. Patient was started on apixaban on 3/11. Apixaban held 3/15 for PEG placement. Last dose of apixaban 02/28/21 PM. Pharmacy consulted for heparin.   ? ?aPTT 85 is at high end of therapeutic on 1300 units/hr. ? ? ?Goal of Therapy:  ?Heparin level 0.3-0.5 units/ml ?aPTT 66 to 85 seconds ?Monitor platelets by anticoagulation protocol: Yes ? ?  ?Plan:  ?Decrease Heparin to 1250 units / hr ?Daily heparin level, CBC, aptt ?Monitor for signs/symptoms of bleeding  ? ? ?Benetta Spar, PharmD, BCPS, BCCP ?Clinical Pharmacist ? ?Please check AMION for all [place] phone numbers ?After 10:00 PM, call [place] [phone] ? ?

## 2022-03-01 NOTE — Progress Notes (Signed)
OT Cancellation Note ? ?Patient Details ?Name: Sarah Myers ?MRN: [id number] ?DOB: 08-09-1971 ? ? ?Cancelled Treatment:    Reason Eval/Treat Not Completed: Fatigue/lethargy limiting ability to participate.  Unable to arouse patient to participate.  OT to continue efforts as appropriate.   ? ?Vanetta Rule D Alaila Pillard ?03/01/2022, 3:37 PM ?

## 2022-03-01 NOTE — Progress Notes (Addendum)
? ?NAME:  Sarah Myers, MRN:  [id number], DOB:  September 16, 1971, LOS: 24 ?ADMISSION DATE:  02/05/2022, CONSULTATION DATE:  2/19 ?REFERRING MD:  Quinn Axe - neuro, CHIEF COMPLAINT:  Left sided weakness, slurred speech  ? ?History of Present Illness:  ?51 y/o female admitted to [facility] on 2/19 with left sided weakness and slurred speech.  Work up consistent with  a right ICH with "brain compression" present on admission.  Required intubation for airway protection, 3% saline, & cleviprex.  Hospital course complicated by prolonged respiratory failure with failed extubations due to stridor requiring tracheostomy (2/28).  ? ?Pertinent  Medical History  ?Hypertension ? ?Significant Hospital Events: ?Including procedures, antibiotic start and stop dates in addition to other pertinent events   ?2/19 admitted to neuro with acute R ICH. Intubated for airway protection. Started on 3% and  cleviprex. PCCM consult. NSGY consult  ?02/06/2022 For repeat CT Head >> Overnight CT showed increase in size of bleed to 36 ml , but unchanged midline shift at 4 mm . Repeating CT head again 2/20 am. Neurosurgery is aware ?2/21 3% saline stopped due to Na 158. weaned off cleviprex. Remains intubated. ?2/22 neuro exam much improved (moves RUE and RLE to command; thumbs up; blinks eyes to command; sticks tongue out); sinus pause overnight w/ inferiolateral ST elevation overnight; troponin 387 then 269 ?2/23 failed extubation due to stridor. Reintubated ?2/24 Diuresis ?2/26 failed 2nd attempt at extubation. CT head ICH without rebleeding. Hematoma and edema causes 4 mm of midline shift. 2. Bilateral cribriform plate cephalocele. ?2/28 tracheostomy by [place], stopped versed infusion ?3/1 trach collar x1 hour ?3/2 worked with PT ?3/5 Off vent 24 hours  ?3/10 Mild hemoptysis from trach on heparin infusion ?03/01/2022 no overt hemoptysis noted remains on heparin infusion ?03/01/2022 changed to cuffless trach ? ?Interim History / Subjective:  ?PCCM called to  see for bloody sputum  ?Pt with mild hemoptysis - bloody secretions noted in suction cath and on patients chest (pt able to cough and clear secretions) ?Remains on heparin infusion for PE  ?03/01/2022 trach to be changed to cuffless #6 ? ?Objective   ?Blood pressure 117/65, pulse 63, temperature 98.7 ?F (37.1 ?C), temperature source Axillary, resp. rate 12, height 5\' 4"  (1.626 m), weight 71.9 kg, SpO2 99 %. ?   ?FiO2 (%):  [28 %] 28 %  ? ?Intake/Output Summary (Last 24 hours) at 03/01/2022 0944 ?Last data filed at 03/01/2022 0640 ?Gross per 24 hour  ?Intake 2000 ml  ?Output 1150 ml  ?Net 850 ml  ? ?Filed Weights  ? 02/27/22 0500 02/28/22 0500 03/01/22 0418  ?Weight: 73.8 kg 74 kg 71.9 kg  ? ? ?Examination: ?General: Obese female who is poorly responsive ?HEENT: MM pink/moist #6 Shiley trach is in place it is cuffed ?Neuro: Does not follow commands ?CV: Heart sounds are right ?PULM: ?Diminished O2 via trach collar ? ?GI: soft, bsx4 active  ?Extremities: warm/dry, 1+ edema  ?Skin: no rashes or lesions ? ? ?Resolved Hospital Problem list   ?MSSA/Serratia pneumonia, fever on 2/28 due to pneumonia  ?AKI   ?Iatrogenic hypernatremia from hypertonic saline given for brain swelling  ?Agitated Delirium  ?Hypertensive Emergency  ?Acute Cardiogenic Pulmonary Edema  ?Stridor due to upper airway edema, failed extubation x 2 ? ?Assessment & Plan:  ?  ?Right temporal hematoma with brain compression   ?Acute Encephalopathy in setting of Stroke  ?Per [facility] and neurology ?Hemoptysis  ?In setting of PE on anticoagulation, likely component of suction trauma ?No further  evidence of hemoptysis ? ? ? ?Acute Hypoxemic Respiratory Failure s/p Tracheostomy  ?Pulmonary Embolism  ?Trach placed 2/28 per B. McQuaid #6 cuffed ?03/01/2022 we will change to a cuffless #6 trach ?RT to perform procedure ?Standard trach care ?All other care per primary ? ? ? ? ? ?Best Practice (right click and "Reselect all SmartList Selections" daily)  ?Diet/type:  tubefeeds ?DVT prophylaxis: systemic heparin ?GI prophylaxis: PPI ?Lines: Central line and yes and it is still needed ?Foley:  N/A ?Code Status:  full code ?Last date of multidisciplinary goals of care discussion - [name] updated bedside 3/10 on plan of care. ? ?PCCM will continue to follow weekly. Please call sooner if new needs arise.  ? ?Critical care time: n/a minutes ?  ? ?Richardson Landry Shalanda Brogden ACNP ?Acute Care Nurse Practitioner ?[place] ?Please consult Amion ?03/01/2022, 9:44 AM ? ? ? ? ? ? ?

## 2022-03-01 NOTE — Progress Notes (Signed)
PROGRESS NOTE    Sarah Myers  XBJ:[id number] DOB: [birth year] DOA: 02/05/2022 PCP: [name], MD    Brief Narrative:  51 year old with no previous known history presented to the emergency room with left-sided weakness and slurred speech.  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 2/19, admitted to intensive care unit with left-sided dense hemiplegia and slurred speech and found to have right intracranial hemorrhage with brain compression present on admission.  Treated with 3% saline, Cleviprex and intubated.  She failed intubation x2 and underwent tracheostomy on 2/28 by PCCM. 2/28, tracheostomy. 3/1 trach collar, off ventilator on 3/4. Transferred to medical floor on 3/6. Hospital course complicated by MSSA pneumonia, she is on Ancef for 7 days.  Pending placement.  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  Normal ejection fraction. CT head 3/8, expected regression of right ICH. CT head 3/12 continued expected interval evolution of intraparenchymal hematoma Followed by critical care and neurology and currently signed off. Patient is on multiple oral medications including Norvasc 10 mg, clonidine 0.1 mg 3 times daily, Coreg 25 mg twice daily, spironolactone 25 mg daily, Cozaar 100 mg daily.  Stable today.  Acute respiratory insufficiency secondary to massive intracranial hemorrhage: 2/28, tracheostomy 3/1, trach collar.  Patient was still wearing cuffed trach unlike mentioned on pulmonary note from 3/10, I called service back to change to cuffless trach to facilitate Passy-Muir valve placement and eating.  Currently remains on room air with trach collar.  Dysphagia: Secondary to massive stroke.  Patient has Dobbhoff tube in place.  Speech therapy continues to follow.  Will  monitor if she can start tolerating oral diet.  If unable, will need PEG tube placement.  We will consult IR after modified barium swallow.  Bilateral pulmonary embolism: Detected on CT angiogram 3/7.  Patient was initially on heparin, then transitioned to Eliquis.  She will probably need PEG tube, will start heparin periprocedure.  Hypercalcemia: Probably secondary to dehydration/calcium mobilization from immobility.  PTH is normal.  Not on supplements.  Will monitor.   DVT prophylaxis: SCD's Start: 02/05/22 0846   Code Status: Full code Family Communication: None at the bedside Disposition Plan: Status is: Inpatient Remains inpatient appropriate because: Unsafe disposition plan.  No permanent intake.     Consultants:  Neurology Neurosurgery Critical care  Procedures:  Tracheostomy 2/28  Antimicrobials:  Cefazolin 3/8---   Subjective: Patient seen and examined.  No family were at the bedside in the morning rounds.  She remains on cuffed trach, mostly on room air.  On Dobbhoff tube feeding. Patient was able to track somehow with her eyes, she followed commands on right hand and squeeze my hand.  Could not move left hand.  Objective: Vitals:   03/01/22 0452 03/01/22 0743 03/01/22 0751 03/01/22 1111  BP:    119/71  Pulse: 62  63 63  Resp: 14  12 16   Temp:  98.5 F (36.9 C)  (P) 98.6 F (37 C)  TempSrc:  Axillary  (P) Axillary  SpO2: 97%  99% 97%  Weight:      Height:        Intake/Output Summary (Last 24 hours) at 03/01/2022 1237 Last data filed at 03/01/2022 0640 Gross per 24 hour  Intake 2000 ml  Output 1150 ml  Net 850 ml   [employer]  02/27/22 0500 02/28/22 0500 03/01/22 0418  Weight: 73.8 kg 74 kg 71.9 kg    Examination:  General exam: Chronically sick looking, frail and debilitated. Patient is mostly sleepy, lethargic, unable to keep up conversation due to dysarthria. She follows simple commands on the right side.  Generalized weakness.  Complete  flaccid paralysis on the left side. Respiratory system: Clear to auscultation. Respiratory effort normal.  Conducted upper airway sounds. Cardiovascular system: S1 & S2 heard, RRR.  Gastrointestinal system: Soft.  Nontender.  Bowel sound present.    Data Reviewed: I have personally reviewed following labs and imaging studies  CBC: Recent Labs  Lab 02/23/22 0437 02/24/22 0329 02/25/22 0121 02/26/22 0026  WBC 8.2 6.7 5.9 6.5  NEUTROABS 5.7  --   --   --   HGB 11.1* 10.6* 11.1* 10.3*  HCT 34.0* 32.6* 34.9* 32.3*  MCV 89.0 89.6 88.8 89.5  PLT PLATELET CLUMPS NOTED ON SMEAR, UNABLE TO ESTIMATE 203 208 209   Basic Metabolic Panel: Recent Labs  Lab 02/24/22 0329 02/25/22 0121 02/26/22 0026 02/26/22 1218 02/27/22 0232 02/28/22 0306  NA 133* 135 131*  --  133* 132*  K 4.4 4.5 4.9  --  4.6 4.6  CL 101 104 101  --  102 101  CO2 23 24 23   --  23 24  GLUCOSE 152* 157* 180*  --  123* 141*  BUN 50* 33* 36*  --  34* 39*  CREATININE 1.07* 0.93 0.98  --  0.94 0.98  CALCIUM 10.3 11.1* 11.0* 11.4* 11.5* 11.1*   GFR: Estimated Creatinine Clearance: 67.5 mL/min (by C-G formula based on SCr of 0.98 mg/dL). Liver Function Tests: No results for input(s): AST, ALT, ALKPHOS, BILITOT, PROT, ALBUMIN in the last 168 hours. No results for input(s): LIPASE, AMYLASE in the last 168 hours. No results for input(s): AMMONIA in the last 168 hours. Coagulation Profile: No results for input(s): INR, PROTIME in the last 168 hours. Cardiac Enzymes: No results for input(s): CKTOTAL, CKMB, CKMBINDEX, TROPONINI in the last 168 hours. BNP (last 3 results) No results for input(s): PROBNP in the last 8760 hours. HbA1C: No results for input(s): HGBA1C in the last 72 hours. CBG: Recent Labs  Lab 02/28/22 2027 02/28/22 2326 03/01/22 0346 03/01/22 0802 03/01/22 1112  GLUCAP 133* 106* 133* 135* 131*   Lipid Profile: No results for input(s): CHOL, HDL, LDLCALC, TRIG, CHOLHDL, LDLDIRECT in the last 72  hours. Thyroid Function Tests: No results for input(s): TSH, T4TOTAL, FREET4, T3FREE, THYROIDAB in the last 72 hours. Anemia Panel: No results for input(s): VITAMINB12, FOLATE, FERRITIN, TIBC, IRON, RETICCTPCT in the last 72 hours. Sepsis Labs: No results for input(s): PROCALCITON, LATICACIDVEN in the last 168 hours.  Recent Results (from the past 240 hour(s))  Culture, Respiratory w Gram Stain     Status: None   Collection Time: 02/20/22  3:47 PM   Specimen: Tracheal Aspirate; Respiratory  Result Value Ref Range Status   Specimen Description TRACHEAL ASPIRATE  Final   Special Requests NONE  Final   Gram Stain   Final    RARE WBC PRESENT, PREDOMINANTLY MONONUCLEAR RARE GRAM POSITIVE COCCI IN PAIRS RARE GRAM NEGATIVE RODS Performed at [facility], [address], [place], [place] [address]    Culture MODERATE STAPHYLOCOCCUS AUREUS  Final   Report Status 02/22/2022 FINAL  Final   Organism ID, Bacteria STAPHYLOCOCCUS AUREUS  Final      Susceptibility   Staphylococcus aureus - MIC*    CIPROFLOXACIN <=0.5  SENSITIVE Sensitive     ERYTHROMYCIN <=0.25 SENSITIVE Sensitive     GENTAMICIN <=0.5 SENSITIVE Sensitive     OXACILLIN 0.5 SENSITIVE Sensitive     TETRACYCLINE <=1 SENSITIVE Sensitive     VANCOMYCIN <=0.5 SENSITIVE Sensitive     TRIMETH/SULFA <=10 SENSITIVE Sensitive     CLINDAMYCIN <=0.25 SENSITIVE Sensitive     RIFAMPIN <=0.5 SENSITIVE Sensitive     Inducible Clindamycin NEGATIVE Sensitive     * MODERATE STAPHYLOCOCCUS AUREUS  Culture, blood (routine x 2)     Status: None   Collection Time: 02/21/22  4:02 AM   Specimen: BLOOD LEFT HAND  Result Value Ref Range Status   Specimen Description BLOOD LEFT HAND  Final   Special Requests   Final    BOTTLES DRAWN AEROBIC ONLY Blood Culture results may not be optimal due to an inadequate volume of blood received in culture bottles   Culture   Final    NO GROWTH 5 DAYS Performed at [facility], [address]  [place], [place] [address]    Report Status 02/26/2022 FINAL  Final  Culture, blood (routine x 2)     Status: None   Collection Time: 02/21/22  4:07 AM   Specimen: BLOOD  Result Value Ref Range Status   Specimen Description BLOOD LEFT ARM  Final   Special Requests   Final    BOTTLES DRAWN AEROBIC AND ANAEROBIC Blood Culture results may not be optimal due to an inadequate volume of blood received in culture bottles   Culture   Final    NO GROWTH 5 DAYS Performed at [facility], [address] [place], [place] [address]    Report Status 02/26/2022 FINAL  Final  Urine Culture     Status: None   Collection Time: 02/26/22  7:02 AM   Specimen: Urine, Catheterized  Result Value Ref Range Status   Specimen Description URINE, CATHETERIZED  Final   Special Requests NONE  Final   Culture   Final    NO GROWTH Performed at [facility], [address], [place], [place] [address]    Report Status 02/27/2022 FINAL  Final         Radiology Studies: No results found.      Scheduled Meds:  amLODipine  10 mg Per Tube Daily   carvedilol  25 mg Per Tube BID WC   chlorhexidine gluconate (MEDLINE KIT)  15 mL Mouth Rinse BID   Chlorhexidine Gluconate Cloth  6 each Topical Q0600   clonazePAM  1.5 mg Per Tube BID   feeding supplement (PROSource TF)  45 mL Per Tube BID   free water  200 mL Per Tube Q8H   guaiFENesin  200 mg Per Tube Q6H   insulin aspart  0-15 Units Subcutaneous Q4H   losartan  100 mg Per Tube Daily   mouth rinse  15 mL Mouth Rinse 10 times per day   multivitamin with minerals  1 tablet Per Tube Daily   nutrition supplement (JUVEN)  1 packet Per Tube BID BM   pantoprazole sodium  40 mg Per Tube Daily   QUEtiapine  50 mg Per Tube QHS   spironolactone  50 mg Per Tube Daily   Continuous Infusions:   ceFAZolin (ANCEF) IV 2 g (03/01/22 0639)   feeding supplement (JEVITY 1.5 CAL/FIBER) 1,000 mL (02/28/22 2349)   heparin 1,300 Units/hr (03/01/22 1048)     LOS: 24  days    Time spent:  35 minutes    Dorcas Carrow, MD [facility] Pager [phone]

## 2022-03-01 NOTE — Progress Notes (Signed)
SLP Cancellation Note ? ?Patient Details ?Name: Sarah Myers ?MRN: [id number] ?DOB: May 31, 1971 ? ? ?Cancelled treatment:       Reason Eval/Treat Not Completed: Patient's level of consciousness. SLP has tried to see patient multiple times throughout the day but she has been asleep and unable to be aroused. Planned for RT to downsize to #6 cuffless trach today. Social worker secure chatted SLP and MD to discuss need for PEG and plan is for likely PEG placement. SLP will continue to follow patient for PO readiness and PMV toleration. ? ?Angela Nevin, MA, CCC-SLP ?Speech Therapy ?' ?

## 2022-03-01 NOTE — Progress Notes (Signed)
ANTICOAGULATION CONSULT NOTE - Follow Up Consult ? ?Pharmacy Consult for Apixaban to Heparin ?Indication: pulmonary embolus / CVA ? ?Allergies  ?Allergen Reactions  ? Lisinopril Cough  ? ? ?Patient Measurements: ?Height: 5\' 4"  (162.6 cm) ?Weight: 71.9 kg (158 lb 8.2 oz) ?IBW/kg (Calculated) : 54.7 ?Heparin Dosing Weight: 70 kg ? ?Vital Signs: ?Temp: 98.7 ?F (37.1 ?C) (03/14 2345) ?Temp Source: Axillary (03/14 2345) ?BP: 117/65 (03/15 0344) ?Pulse Rate: 63 (03/15 0751) ? ?Labs: ?Recent Labs  ?  02/27/22 ?0232 02/28/22 ?0306  ?CREATININE 0.94 0.98  ? ? ? ?Estimated Creatinine Clearance: 67.5 mL/min (by C-G formula based on SCr of 0.98 mg/dL). ? ?Assessment: ?84 YOF with bilateral PE on CTA without RHS. Of note, patient was initially admitted 2/19 with ICH - now stable on follow-up imaging.  ? ?Apixaban back to heparin for PEG tube placement ?Last dose of apixaban 02/28/21 PM ? ?Goal of Therapy:  ?Heparin level 0.3-0.5 units/ml ?Monitor platelets by anticoagulation protocol: Yes ?PTT 66 to 85 seconds ?  ?Plan:  ?Heparin at 1300 units / hr ?PTT 8 hours after heparin begins ?Daily heparin level, CBC, aptt ? ?Thank you ?03/02/21, PharmD ?03/01/2022 9:31 AM ? ? ? ? ?

## 2022-03-01 NOTE — Progress Notes (Signed)
Order placed this AM to change to cuffless tube , however they are backordered until 3/23-3/25 per SPD.  Dr Thora Lance is aware and will discuss with team tomorrow.  ?

## 2022-03-02 ENCOUNTER — Inpatient Hospital Stay (HOSPITAL_COMMUNITY): Payer: 59

## 2022-03-02 DIAGNOSIS — Z7189 Other specified counseling: Secondary | ICD-10-CM

## 2022-03-02 DIAGNOSIS — Z515 Encounter for palliative care: Secondary | ICD-10-CM

## 2022-03-02 DIAGNOSIS — R1312 Dysphagia, oropharyngeal phase: Secondary | ICD-10-CM

## 2022-03-02 LAB — CBC
HCT: 35.3 % — ABNORMAL LOW (ref 36.0–46.0)
Hemoglobin: 11.4 g/dL — ABNORMAL LOW (ref 12.0–15.0)
MCH: 28.9 pg (ref 26.0–34.0)
MCHC: 32.3 g/dL (ref 30.0–36.0)
MCV: 89.4 fL (ref 80.0–100.0)
Platelets: 247 10*3/uL (ref 150–400)
RBC: 3.95 MIL/uL (ref 3.87–5.11)
RDW: 13.9 % (ref 11.5–15.5)
WBC: 7.9 10*3/uL (ref 4.0–10.5)
nRBC: 0 % (ref 0.0–0.2)

## 2022-03-02 LAB — GLUCOSE, CAPILLARY
Glucose-Capillary: 101 mg/dL — ABNORMAL HIGH (ref 70–99)
Glucose-Capillary: 112 mg/dL — ABNORMAL HIGH (ref 70–99)
Glucose-Capillary: 113 mg/dL — ABNORMAL HIGH (ref 70–99)
Glucose-Capillary: 125 mg/dL — ABNORMAL HIGH (ref 70–99)
Glucose-Capillary: 137 mg/dL — ABNORMAL HIGH (ref 70–99)
Glucose-Capillary: 143 mg/dL — ABNORMAL HIGH (ref 70–99)

## 2022-03-02 LAB — HEPARIN LEVEL (UNFRACTIONATED): Heparin Unfractionated: >1.1 IU/mL — ABNORMAL HIGH (ref 0.30–0.70)

## 2022-03-02 LAB — APTT: aPTT: 105 seconds — ABNORMAL HIGH (ref 24–36)

## 2022-03-02 NOTE — Progress Notes (Signed)
Speech Language Pathology Treatment: Dysphagia  ?Patient Details ?Name: MAGALIE ALMON ?MRN: [id number] ?DOB: Oct 26, 1971 ?Today's Date: 03/02/2022 ?Time: 7017-7939 ?SLP Time Calculation (min) (ACUTE ONLY): 15 min ? ?Assessment / Plan / Recommendation ?Clinical Impression ? Patient seen by SLP for PO trials as Palliative NP met with patient's three daughters and decision was for trial of PO's and PEG if needed. Patient was awake, fidgeting and had right restraint mitt off. SLP put back on. Patient allowed for some oral care with oral suction and did allow SLP to help reposition her more in center of bed. With PMV donned, patient vocalized when cued. She did nod when asked if she wanted to try some ice, but when ice chip brought to mouth, she held it briefly in anterior portion of oral cavity and then spit out. SLP then presented teaspoon of honey thick cranberry juice. Patient did appear more interested in this, actively starting to take into mouth but she then held liquid in oral cavity and shook her head. SLP suctioned out cranberry juice as no swallow initiated and patient not responding appropriately to it. Patient started pointing with mitt hand to her leg and to recliner. When SLP asked her if she wanted to sit in recliner she did nod head, however unable to determine her intent. SLP is recommending continue NPO and SLP will continue to follow for PO trials and PMV toleration. ? ?  ?HPI HPI: 51 y/o female came to the [facility] ER on 2/19 with left sided weakness and slurred speech.  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 Intubated 2/19. Trach changed from #6 cuffed to #4 cuffless on 3/16. Palliative NP met with patient's three daughters and decision was reached that they would like to see how PO trials go and if not tolerated they are open to PEG; SNF at discharge is anticipated by all parties. ?  ?   ?SLP Plan ? Continue with current plan of care ? ?  ?  ?Recommendations for  follow up therapy are one component of a multi-disciplinary discharge planning process, led by the attending physician.  Recommendations may be updated based on patient status, additional functional criteria and insurance authorization. ?  ? ?Recommendations  ?Diet recommendations: NPO ?Medication Administration: Via alternative means  ?   ? Patient may use Passy-Muir Speech Valve: with SLP only ?PMSV Supervision: Full ?MD: Please consider changing trach tube to : Other (comment) (changed 3/16 to #4 cuffless)  ?   ? ? ? ? Oral Care Recommendations: Oral care QID;Staff/trained caregiver to provide oral care ?Follow Up Recommendations: Skilled nursing-short term rehab (<3 hours/day) ?Assistance recommended at discharge: Frequent or constant Supervision/Assistance ?SLP Visit Diagnosis: Dysphagia, unspecified (R13.10) ?Plan: Continue with current plan of care ? ? ? ? ?  ?  ? ? ?Sonia Baller, MA, CCC-SLP ?Speech Therapy ? ?

## 2022-03-02 NOTE — Progress Notes (Signed)
? ?NAME:  Sarah Myers, MRN:  [id number], DOB:  1971-07-22, LOS: 25 ?ADMISSION DATE:  02/05/2022, CONSULTATION DATE:  2/19 ?REFERRING MD:  Selina Cooley - neuro, CHIEF COMPLAINT:  Left sided weakness, slurred speech  ? ?History of Present Illness:  ?51 y/o female admitted to [facility] on 2/19 with left sided weakness and slurred speech.  Work up consistent with  a right ICH with "brain compression" present on admission.  Required intubation for airway protection, 3% saline, & cleviprex.  Hospital course complicated by prolonged respiratory failure with failed extubations due to stridor requiring tracheostomy (2/28).  ? ?Pertinent  Medical History  ?Hypertension ? ?Significant Hospital Events: ?Including procedures, antibiotic start and stop dates in addition to other pertinent events   ?2/19 admitted to neuro with acute R ICH. Intubated for airway protection. Started on 3% and  cleviprex. PCCM consult. NSGY consult  ?02/06/2022 For repeat CT Head >> Overnight CT showed increase in size of bleed to 36 ml , but unchanged midline shift at 4 mm . Repeating CT head again 2/20 am. Neurosurgery is aware ?2/21 3% saline stopped due to Na 158. weaned off cleviprex. Remains intubated. ?2/22 neuro exam much improved (moves RUE and RLE to command; thumbs up; blinks eyes to command; sticks tongue out); sinus pause overnight w/ inferiolateral ST elevation overnight; troponin 387 then 269 ?2/23 failed extubation due to stridor. Reintubated ?2/24 Diuresis ?2/26 failed 2nd attempt at extubation. CT head ICH without rebleeding. Hematoma and edema causes 4 mm of midline shift. 2. Bilateral cribriform plate cephalocele. ?2/28 tracheostomy by Kendrick Fries, stopped versed infusion ?3/1 trach collar x1 hour ?3/2 worked with PT ?3/5 Off vent 24 hours  ?3/10 Mild hemoptysis from trach on heparin infusion ?03/01/2022 no overt hemoptysis noted remains on heparin infusion ?03/01/2022 changed to cuffless trach ? ?Interim History / Subjective:  ?No acute issues.   ? ?Objective   ?Blood pressure 123/78, pulse 73, temperature 98 ?F (36.7 ?C), temperature source Axillary, resp. rate 18, height 5\' 4"  (1.626 m), weight 79.8 kg, SpO2 97 %. ?   ?FiO2 (%):  [28 %] 28 %  ? ?Intake/Output Summary (Last 24 hours) at 03/02/2022 0953 ?Last data filed at 03/02/2022 03/04/2022 ?Gross per 24 hour  ?Intake 1063.09 ml  ?Output 2800 ml  ?Net -1736.91 ml  ? ?Filed Weights  ? 02/28/22 0500 03/01/22 0418 03/02/22 0500  ?Weight: 74 kg 71.9 kg 79.8 kg  ? ? ?Examination: ?General appearance: 51 y.o., female, nad ?HENT: NCAT; MMM ?Neck: 6 cuffed shiley flex in place ?Lungs: CTAB, with normal respiratory effort ?CV: RRR, no murmur  ?Abdomen: Soft, non-tender; non-distended, BS present  ?Extremities: No peripheral edema, warm ? ?Labs/imaging reviewed  ? ? ?Resolved Hospital Problem list   ?MSSA/Serratia pneumonia, fever on 2/28 due to pneumonia  ?AKI   ?Iatrogenic hypernatremia from hypertonic saline given for brain swelling  ?Agitated Delirium  ?Hypertensive Emergency  ?Acute Cardiogenic Pulmonary Edema  ?Stridor due to upper airway edema, failed extubation x 2 ?Hemoptysis  ? ?Assessment & Plan:  ?  ?Acute Hypoxemic Respiratory Failure s/p Tracheostomy  ?Pulmonary Embolism  ?Trach placed 2/28 per B. McQuaid #6 cuffed, change to 4 cuffless shiley today (no 6 cuffless available and sounds like she's doing well enough from secretion standpoint to give a smaller one a shot) ?RT to perform procedure ?Routine trach bundle ? ?Right temporal hematoma with brain compression   ?Acute Encephalopathy in setting of Stroke  ?Per [facility] and neurology ? ? ? ? ? ?Best Practice (right click  and "Reselect all SmartList Selections" daily)  ?Per primary ? ?PCCM will continue to follow weekly. Please call sooner if new needs arise.  ? ?Critical care time: n/a minutes ?  ? ?Sarah Myers  ?[place] ? ?03/02/2022, 9:53 AM ? ? ? ? ? ? ?

## 2022-03-02 NOTE — Consult Note (Signed)
? ?                                                                                ?Consultation Note ?Date: 03/02/2022  ? ?Patient Name: Sarah Myers  ?DOB: 1971/04/14  MRN: [id number]  Age / Sex: 51 y.o., female  ?PCP: [name], MD ?Referring Physician: Barb Merino, MD ? ?Reason for Consultation: Establishing goals of care ? ?HPI/Patient Profile: 51 y.o. female   admitted on 02/05/2022 after presenting to emergency room with left-sided weakness and slurred speech.  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  ? ?IMPRESSION:   Brain MRI 02/07/22 ?Acute to early subacute large parenchymal hemorrhage as seen on ?recent CT imaging with similar surrounding edema and regional mass ?effect. Intraventricular extension is again noted. No hydrocephalus. ? ? 2/28 failed extubation, tracheostomy ?3/1 trach collar, off ventilator on 3/4. ?Transferred to medical floor on 3/6. ?Hospital course complicated by MSSA pneumonia ? ?Today is day 24 of this hospital stay.  Patient remains with core-tac for nutritional support.  She is altered; agitated. Requiring hand mitts.  Unable to follow commands ? ?Family face treatment option decsions, advanced directive decsions and anticiaptroy care needs.  ? ? ?Clinical Assessment and Goals of Care: ? ?This NP Wadie Lessen reviewed medical records, received report from team, assessed the patient and then meet at the patient's bedside with her three daughters; Lubertha Sayres in person and [name] and [place] by phone conference. to discuss diagnosis, prognosis, GOC, EOL wishes disposition and options. ?  ?Concept of Palliative Care was introduced as specialized medical care for people and their families living with serious illness.  If focuses on providing relief from the symptoms and stress of a serious illness.  The goal is to improve quality of life for both the patient and the family. ? ?Values and goals of care important to patient and family were attempted to be elicited. ? ?Created space  and opportunity for patient  and family to explore thoughts and feelings regarding current medical situation.  All three daughters verbalize and understanding of the patient's serious medical situation.   ?  ?A  discussion was had today regarding advanced directives.  ? ? Concepts specific to code status, artifical feeding and hydration, continued IV antibiotics and rehospitalization was had.   ? ?The difference between a aggressive medical intervention path  and a palliative comfort care path for this patient at this time was had.   ? ?Education offered on the natural trajectory and expectations at EOL were discussed.  ? ?Education offered on best case scenario vs worst case scenario presented.    Detailed education offered on long term artificial feeding specific to PEG placement; risks ad benefits explored. ? ?Education offered on natural trajectory of a patient with a severe brain injury, SNF placement, bed bound status and increased risk of infections. ?  ?MOST form introduced, left for review and a Hard Choices booklet left also ?  ? Questions and concerns addressed.  Patient  encouraged to call with questions or concerns.   ?  ?PMT will continue to support holistically. ?  ?  ? ? ? No documented HPOA of ACP documents in place.  Three daughter act in unison for decisions. ? ?  ? ?SUMMARY OF RECOMMENDATIONS   ? ?Code Status/Advance Care Planning: ?Full Code ?     Educated family to consider DNR/DNI status understanding evidenced based poor outcomes in similar hospitalized patient, as the cause of arrest is likely associated with advanced chronic illness rather than an easily reversible acute cardio-pulmonary event.  ? ? ?Symptom Management:  ?Dysphagia:  education offered concept of dysphagia 2/2 to brain injury and on patient high risk for aspiration.    Family would like a trail of orals, however they wish to proceed with PEG if unable to tolerate ? ?Palliative Prophylaxis:  ?Aspiration  precautions ? ?Additional Recommendations (Limitations, Scope, Preferences): ?Family are open to all offered and available medical interventions to prolong life. ? ?Psycho-social/Spiritual:  ?Desire for further Chaplaincy support: yes  ? ? ?Prognosis:  ?Unable to determine, will depend on desire for life prolonging measures and patient outcomes ? ?Discharge Planning: to be determined  ? ?  ? ?Primary Diagnoses: ?Present on Admission: ? ICH (intracerebral hemorrhage) ([place]) ? ? ?I have reviewed the medical record, interviewed the patient and family, and examined the patient. The following aspects are pertinent. ? ?Past Medical History:  ?Diagnosis Date  ? Hypertension   ? ?Social History  ? ?Socioeconomic History  ? Marital status: Married  ?  Spouse name: Not on file  ? Number of children: Not on file  ? Years of education: Not on file  ? Highest education level: Not on file  ?Occupational History  ? Not on file  ?Tobacco Use  ? Smoking status: Never  ? Smokeless tobacco: Never  ?Vaping Use  ? Vaping Use: Never used  ?Substance and Sexual Activity  ? Alcohol use: Not Currently  ?  Alcohol/week: 0.0 standard drinks  ? Drug use: No  ? Sexual activity: Never  ?Other Topics Concern  ? Not on file  ?Social History Narrative  ? Not on file  ? ?Social Determinants of Health  ? ?Financial Resource Strain: Not on file  ?Food Insecurity: Not on file  ?Transportation Needs: Not on file  ?Physical Activity: Not on file  ?Stress: Not on file  ?Social Connections: Not on file  ? ?Family History  ?Problem Relation Age of Onset  ? Diabetes Mother   ? Hypertension Mother   ? Heart disease Mother   ? Diabetes Maternal Aunt   ? Stroke Maternal Uncle   ? Diabetes Maternal Grandmother   ? Heart disease Maternal Grandmother   ? Diabetes Maternal Grandfather   ? Heart disease Maternal Grandfather   ? Heart disease Paternal Grandmother   ? Heart disease Paternal Grandfather   ? ?Scheduled Meds: ? amLODipine  10 mg Per Tube Daily  ?  carvedilol  25 mg Per Tube BID WC  ? chlorhexidine gluconate (MEDLINE KIT)  15 mL Mouth Rinse BID  ? Chlorhexidine Gluconate Cloth  6 each Topical Q0600  ? clonazePAM  1.5 mg Per Tube BID  ? feeding supplement (PROSource TF)  45 mL Per Tube BID  ? free water  200 mL Per Tube Q8H  ? guaiFENesin  200 mg Per Tube Q6H  ? insulin aspart  0-15 Units Subcutaneous Q4H  ? losartan  100 mg Per Tube Daily  ? mouth rinse  15 mL Mouth Rinse 10 times per day  ? multivitamin with minerals  1 tablet Per Tube Daily  ? nutrition supplement (JUVEN)  1 packet Per Tube BID BM  ? pantoprazole  sodium  40 mg Per Tube Daily  ? QUEtiapine  50 mg Per Tube QHS  ? spironolactone  50 mg Per Tube Daily  ? ?Continuous Infusions: ?  ceFAZolin (ANCEF) IV 2 g (03/02/22 0515)  ? feeding supplement (JEVITY 1.5 CAL/FIBER) 1,000 mL (03/01/22 1613)  ? heparin 1,250 Units/hr (03/02/22 7583)  ? ?PRN Meds:.acetaminophen **OR** acetaminophen (TYLENOL) oral liquid 160 mg/5 mL **OR** acetaminophen, cloNIDine, hydrALAZINE, labetalol, midazolam, oxyCODONE, senna-docusate ?Medications Prior to Admission:  ?Prior to Admission medications   ?Medication Sig Start Date End Date Taking? Authorizing Provider  ?carvedilol (COREG) 25 MG tablet TAKE 1 TABLET BY MOUTH TWICE DAILY WITH A MEAL ?Patient taking differently: Take 25 mg by mouth 2 (two) times daily with a meal. 01/13/22  Yes [name], MD  ?hydrochlorothiazide (HYDRODIURIL) 25 MG tablet Take 1 tablet by mouth once daily 12/14/21  Yes [name], MD  ? ?Allergies  ?Allergen Reactions  ? Lisinopril Cough  ? ?Review of Systems  ?Unable to perform ROS: Acuity of condition  ? ?Physical Exam ?Cardiovascular:  ?   Rate and Rhythm: Normal rate.  ?Pulmonary:  ?   Effort: Pulmonary effort is normal.  ?Neurological:  ?   Mental Status: She is disoriented.  ? ? ?Vital Signs: BP 123/78 (BP Location: Right Arm)   Pulse 73   Temp 98 ?F (36.7 ?C) (Axillary)   Resp 18   Ht '5\' 4"'  (1.626 m)   Wt 79.8 kg   SpO2 97%    BMI 30.20 kg/m?  ?Pain Scale: PAINAD ?POSS *See Group Information*: S-Acceptable,Sleep, easy to arouse ?Pain Score: 0-No pain ? ? ?SpO2: SpO2: 97 % ?O2 Device:SpO2: 97 % ?O2 Flow Rate: .O2 Flow Rate (L/min): 5 L/min ? ?IO: Intake/output

## 2022-03-02 NOTE — Progress Notes (Signed)
Physical Therapy Treatment ?Patient Details ?Name: Sarah Myers ?MRN: [id number] ?DOB: April 04, 1971 ?Today's Date: 03/02/2022 ? ? ?History of Present Illness 51 y.o. female presents to [facility] hospital on 02/05/2022 with L hemiplegia and dysarthria. 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 Pt intubated 2/19. Attempted extubation 2/23 and 2/26, requiring reintubation same date. Tracheostomy 02/14/2022. PMH includes uncontrolled HTN ? ?  ?PT Comments  ? ? Pt restless with R leg flailing over the R rail on arrival.  Pt with episodes of focus and acknowledging questions with head nods/shakes.  Emphasis on transitions to EOB, 10-15 minutes sitting EOB working on purposeful  truncal control and standing during peri care and bed change x 3 with max of 1-2 assist. ?   ?Recommendations for follow up therapy are one component of a multi-disciplinary discharge planning process, led by the attending physician.  Recommendations may be updated based on patient status, additional functional criteria and insurance authorization. ? ?Follow Up Recommendations ? Skilled nursing-short term rehab (<3 hours/day) ?  ?  ?Assistance Recommended at Discharge Frequent or constant Supervision/Assistance  ?Patient can return home with the following Two people to help with walking and/or transfers;Two people to help with bathing/dressing/bathroom;Assistance with cooking/housework;Assistance with feeding;Direct supervision/assist for financial management;Direct supervision/assist for medications management;Help with stairs or ramp for entrance;Assist for transportation ?  ?Equipment Recommendations ? Wheelchair (measurements PT);Wheelchair cushion (measurements PT);Hospital bed;Other (comment)  ?  ?Recommendations for Other Services Rehab consult ? ? ?  ?Precautions / Restrictions Precautions ?Precautions: Fall ?Precaution Comments: L neglect, NG tube, trach collar ?Restrictions ?Weight Bearing Restrictions: No  ?   ? ?Mobility ? Bed Mobility ?Overal bed mobility: Needs Assistance ?Bed Mobility: Supine to Sit, Sit to Supine ?  ?  ?Supine to sit: Total assist, +2 for physical assistance, +2 for safety/equipment, Max assist ?Sit to supine: Total assist, +2 for physical assistance, +2 for safety/equipment, Max assist ?  ?General bed mobility comments: Assist for all aspects, pt helping appropriately or purposefully at times only needing maximal assist ?  ? ?Transfers ?Overall transfer level: Needs assistance ?  ?Transfers: Sit to/from Stand ?Sit to Stand: Max assist, Total assist, +2 physical assistance, +2 safety/equipment ?  ?  ?  ?  ?  ?General transfer comment: sit to stand x3, 2 person on the first trial with R LE withdrawing up under her.  2nd and 3rd trials used face to face assist of 1 to stand. ?  ? ?Ambulation/Gait ?  ?  ?  ?  ?  ?  ?  ?  ? ? ?Stairs ?  ?  ?  ?  ?  ? ? ?Wheelchair Mobility ?  ? ?Modified Rankin (Stroke Patients Only) ?Modified Rankin (Stroke Patients Only) ?Modified Rankin: Severe disability ? ? ?  ?Balance Overall balance assessment: Needs assistance ?Sitting-balance support: Feet supported, Bilateral upper extremity supported ?Sitting balance-Leahy Scale: Zero (zero working towards poor) ?Sitting balance - Comments: Max A with intermittent min A sitting EOB for ~20 minutes.  Occasional episodes of mod assist when pt appeared to focus on task for a brief time. ?  ?  ?Standing balance-Leahy Scale: Zero ?  ?  ?  ?  ?  ?  ?  ?  ?  ?  ?  ?  ?  ? ?  ?Cognition Arousal/Alertness: Awake/alert ?Behavior During Therapy: Flat affect ?Overall Cognitive Status: Difficult to assess ?Area of Impairment: Attention, Following commands, Safety/judgement ?  ?  ?  ?  ?  ?  ?  ?  ?  ?  Current Attention Level: Focused ?  ?Following Commands: Follows one step commands inconsistently ?Safety/Judgement: Decreased awareness of safety ?  ?Problem Solving: Slow processing ?General Comments: difficult to assess due to impairments  listed below. Pt not shaking head yes/no this date. follow ~10% of commands. Eyes open 90% of session with R gaze; not tracking. Waved "bye" to therapist at the end of the session. ?  ?  ? ?  ?Exercises   ? ?  ?General Comments General comments (skin integrity, edema, etc.): BP 130's'100 ?  ?  ? ?Pertinent Vitals/Pain Pain Assessment ?Pain Assessment: Faces ?Faces Pain Scale: No hurt  ? ? ?Home Living   ?  ?  ?  ?  ?  ?  ?  ?  ?  ?   ?  ?Prior Function    ?  ?  ?   ? ?PT Goals (current goals can now be found in the care plan section) Acute Rehab PT Goals ?Time For Goal Achievement: 03/01/22 ?Potential to Achieve Goals: Fair ? ?  ?Frequency ? ? ? Min 3X/week ? ? ? ?  ?PT Plan Current plan remains appropriate  ? ? ?Co-evaluation   ?  ?  ?  ?  ? ?  ?AM-PAC PT "6 Clicks" Mobility   ?Outcome Measure ? Help needed turning from your back to your side while in a flat bed without using bedrails?: Total ?Help needed moving from lying on your back to sitting on the side of a flat bed without using bedrails?: Total ?Help needed moving to and from a bed to a chair (including a wheelchair)?: Total ?Help needed standing up from a chair using your arms (e.g., wheelchair or bedside chair)?: Total ?Help needed to walk in hospital room?: Total ?Help needed climbing 3-5 steps with a railing? : Total ?6 Click Score: 6 ? ?  ?End of Session   ?Activity Tolerance: Patient limited by fatigue ?Patient left: in bed;with call bell/phone within reach;with bed alarm set;with family/visitor present ?Nurse Communication: Mobility status ?PT Visit Diagnosis: Other abnormalities of gait and mobility (R26.89);Hemiplegia and hemiparesis ?Hemiplegia - Right/Left: Left ?Hemiplegia - dominant/non-dominant: Dominant ?Hemiplegia - caused by: Nontraumatic intracerebral hemorrhage ?  ? ? ?Time: [id number] ?PT Time Calculation (min) (ACUTE ONLY): 32 min ? ?Charges:  $Therapeutic Activity: 8-22 mins ?$Neuromuscular Re-education: 8-22 mins          ?           ? ?03/02/2022 ? ?Ginger Carne., PT ?Acute Rehabilitation Services ?[phone]  (pager) ?[phone]  (office) ? ? ?Tessie Fass Vernor Monnig ?03/02/2022, 3:58 PM ? ?

## 2022-03-02 NOTE — Progress Notes (Signed)
Trach changed from #6 shiley to #4 shiley. Color change on ETCO2. No noted respirtory issues at this time.  ?

## 2022-03-02 NOTE — Progress Notes (Signed)
?PROGRESS NOTE ? ? ? ?Sarah Myers  QMG:[id number] DOB: [birth year] DOA: 02/05/2022 ?PCP: [name], MD  ? ? ?Brief Narrative:  ?51 year old with no previous known history presented to the emergency room with left-sided weakness and slurred speech.  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 ?2/19, admitted to intensive care unit with left-sided dense hemiplegia and slurred speech and found to have right intracranial hemorrhage with brain compression present on admission.  Treated with 3% saline, Cleviprex and intubated.  She failed intubation x2 and underwent tracheostomy on 2/28 by PCCM. ?2/28, tracheostomy. ?3/1 trach collar, off ventilator on 3/4. ?Transferred to medical floor on 3/6. ?Hospital course complicated by MSSA pneumonia, she is on Ancef for 7 days.  Pending placement. ? ?Assessment & Plan: ?  ?Acute right basal ganglia hemorrhage, hypertensive emergency: ?CT head on arrival, acute right lentiform hemorrhage with mass effect 5 mm left shift ?CT head 2/26 stable hematoma ?Echocardiogram, essentially normal.  Normal ejection fraction. ?CT head 3/8, expected regression of right ICH. ?CT head 3/12 continued expected interval evolution of intraparenchymal hematoma ?Followed by critical care and neurology and currently signed off. ?Patient is on multiple oral medications including Norvasc 10 mg, clonidine 0.1 mg 3 times daily, Coreg 25 mg twice daily, spironolactone 25 mg daily, Cozaar 100 mg daily.  Blood pressure stable. ?Klonopin and Seroquel for agitation and discomfort. ? ?Acute respiratory insufficiency secondary to massive intracranial hemorrhage: ?2/28, tracheostomy ?3/1, trach collar. ?Followed by pulmonary, is still wearing cuffed trach.  Cuffless trach apparently on back orders.  Mostly remains on room air.   ? 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  Patient has Dobbhoff tube in place.  Speech therapy continues to follow.  ?More awake and restless today, speech therapy to evaluate.   ?Consulted interventional radiology for PEG tube placement.  ? ?Bilateral pulmonary embolism: Detected on CT angiogram 3/7.  Patient was initially on heparin, then transitioned to Eliquis.  Keep on heparin periprocedure. ?Cleared for anticoagulation by neurology on 3/8, Dr. Roda Shutters. ? ?Hypercalcemia: Probably secondary to dehydration/calcium mobilization from immobility.  PTH is normal.  Not on supplements.  Will monitor. ? ?Goal of care: Critically sick.  High risk of rehemorrhage on anticoagulation.  Now need to decide about PEG tube placement.  Palliative care consulted.  They will be during family meeting today. ? ? ?DVT prophylaxis: SCD's Start: 02/05/22 0846 ? ? ?Code Status: Full code ?Family Communication: Daughter on phone 3/15, family meeting with palliative today. ?Disposition Plan: Status is: Inpatient ?Remains inpatient appropriate because: Unsafe disposition plan.  No oral intake. ?  ? ? ?Consultants:  ?Neurology ?Neurosurgery ?Critical care ? ?Procedures:  ?Tracheostomy 2/28 ? ?Antimicrobials:  ?Cefazolin 3/8--- ? ? ?Subjective: ? ?Patient seen and examined.  More restless and awake today.  Exactly unable to express her needs but she was pointing to reposition her.  Moves right side.  Does not move left side.  Mostly on room air with trach collar. ? ?Objective: ?Vitals:  ? 03/02/22 0336 03/02/22 0343 03/02/22 0500 03/02/22 0747  ?BP: 118/80 118/80  123/78  ?Pulse: 68 68  73  ?Resp: 16 17  18   ?Temp: 97.7 ?F (36.5 ?C)   98 ?F (36.7 ?C)  ?TempSrc: Axillary   Axillary  ?SpO2: 97%   97%  ?Weight:   79.8 kg   ?Height:      ? ? ?Intake/Output Summary (Last 24 hours) at 03/02/2022 1050 ?Last data filed at 03/02/2022 03/04/2022 ?Gross per 24 hour  ?Intake 1063.09 ml  ?Output 2800  ml  ?Net -1736.91 ml  ? ? ?Filed Weights  ? 02/28/22 0500 03/01/22 0418 03/02/22 0500  ?Weight: 74 kg 71.9 kg 79.8 kg  ? ? ?Examination: ? ?General exam: Chronically sick looking, frail and debilitated. ?Spontaneously moving right side.  Flaccid  left side.  Follows simple commands on the stimulation on the right side. ?Respiratory system: Clear to auscultation. Respiratory effort normal.  Conducted upper airway sounds. ?Cardiovascular system: S1 & S2 heard, RRR.  ?Gastrointestinal system: Soft.  Nontender.  Bowel sound present. ? ? ? ?Data Reviewed: I have personally reviewed following labs and imaging studies ? ?CBC: ?Recent Labs  ?Lab 02/24/22 ?0329 02/25/22 ?0121 02/26/22 ?0026 03/02/22 ?0407  ?WBC 6.7 5.9 6.5 7.9  ?HGB 10.6* 11.1* 10.3* 11.4*  ?HCT 32.6* 34.9* 32.3* 35.3*  ?MCV 89.6 88.8 89.5 89.4  ?PLT 203 208 209 247  ? ? ?Basic Metabolic Panel: ?Recent Labs  ?Lab 02/24/22 ?0329 02/25/22 ?0121 02/26/22 ?0026 02/26/22 ?1218 02/27/22 ?3612 02/28/22 ?2449  ?NA 133* 135 131*  --  133* 132*  ?K 4.4 4.5 4.9  --  4.6 4.6  ?CL 101 104 101  --  102 101  ?CO2 23 24 23   --  23 24  ?GLUCOSE 152* 157* 180*  --  123* 141*  ?BUN 50* 33* 36*  --  34* 39*  ?CREATININE 1.07* 0.93 0.98  --  0.94 0.98  ?CALCIUM 10.3 11.1* 11.0* 11.4* 11.5* 11.1*  ? ? ?GFR: ?Estimated Creatinine Clearance: 70.9 mL/min (by C-G formula based on SCr of 0.98 mg/dL). ?Liver Function Tests: ?No results for input(s): AST, ALT, ALKPHOS, BILITOT, PROT, ALBUMIN in the last 168 hours. ?No results for input(s): LIPASE, AMYLASE in the last 168 hours. ?No results for input(s): AMMONIA in the last 168 hours. ?Coagulation Profile: ?No results for input(s): INR, PROTIME in the last 168 hours. ?Cardiac Enzymes: ?No results for input(s): CKTOTAL, CKMB, CKMBINDEX, TROPONINI in the last 168 hours. ?BNP (last 3 results) ?No results for input(s): PROBNP in the last 8760 hours. ?HbA1C: ?No results for input(s): HGBA1C in the last 72 hours. ?CBG: ?Recent Labs  ?Lab 03/01/22 ?1610 03/01/22 ?2043 03/01/22 ?2346 03/02/22 ?0334 03/02/22 ?0850  ?GLUCAP 160* 120* 145* 101* 113*  ? ? ?Lipid Profile: ?No results for input(s): CHOL, HDL, LDLCALC, TRIG, CHOLHDL, LDLDIRECT in the last 72 hours. ?Thyroid Function Tests: ?No  results for input(s): TSH, T4TOTAL, FREET4, T3FREE, THYROIDAB in the last 72 hours. ?Anemia Panel: ?No results for input(s): VITAMINB12, FOLATE, FERRITIN, TIBC, IRON, RETICCTPCT in the last 72 hours. ?Sepsis Labs: ?No results for input(s): PROCALCITON, LATICACIDVEN in the last 168 hours. ? ?Recent Results (from the past 240 hour(s))  ?Culture, Respiratory w Gram Stain     Status: None  ? Collection Time: 02/20/22  3:47 PM  ? Specimen: Tracheal Aspirate; Respiratory  ?Result Value Ref Range Status  ? Specimen Description TRACHEAL ASPIRATE  Final  ? Special Requests NONE  Final  ? Gram Stain   Final  ?  RARE WBC PRESENT, PREDOMINANTLY MONONUCLEAR ?RARE GRAM POSITIVE COCCI IN PAIRS ?RARE GRAM NEGATIVE RODS ?Performed at [facility], [address], [place], [place] [address] ?  ? Culture MODERATE STAPHYLOCOCCUS AUREUS  Final  ? Report Status 02/22/2022 FINAL  Final  ? Organism ID, Bacteria STAPHYLOCOCCUS AUREUS  Final  ?    Susceptibility  ? Staphylococcus aureus - MIC*  ?  CIPROFLOXACIN <=0.5 SENSITIVE Sensitive   ?  ERYTHROMYCIN <=0.25 SENSITIVE Sensitive   ?  GENTAMICIN <=0.5 SENSITIVE  Sensitive   ?  OXACILLIN 0.5 SENSITIVE Sensitive   ?  TETRACYCLINE <=1 SENSITIVE Sensitive   ?  VANCOMYCIN <=0.5 SENSITIVE Sensitive   ?  TRIMETH/SULFA <=10 SENSITIVE Sensitive   ?  CLINDAMYCIN <=0.25 SENSITIVE Sensitive   ?  RIFAMPIN <=0.5 SENSITIVE Sensitive   ?  Inducible Clindamycin NEGATIVE Sensitive   ?  * MODERATE STAPHYLOCOCCUS AUREUS  ?Culture, blood (routine x 2)     Status: None  ? Collection Time: 02/21/22  4:02 AM  ? Specimen: BLOOD LEFT HAND  ?Result Value Ref Range Status  ? Specimen Description BLOOD LEFT HAND  Final  ? Special Requests   Final  ?  BOTTLES DRAWN AEROBIC ONLY Blood Culture results may not be optimal due to an inadequate volume of blood received in culture bottles  ? Culture   Final  ?  NO GROWTH 5 DAYS ?Performed at [facility], [address], [place], [place] [address] ?  ? Report Status  02/26/2022 FINAL  Final  ?Culture, blood (routine x 2)     Status: None  ? Collection Time: 02/21/22  4:07 AM  ? Specimen: BLOOD  ?Result Value Ref Range Status  ? Specimen Description BLOOD LEFT ARM  Final

## 2022-03-02 NOTE — Progress Notes (Signed)
ANTICOAGULATION CONSULT NOTE - Follow Up Consult ? ?Pharmacy Consult for Apixaban to Heparin ?Indication: pulmonary embolus / CVA ? ?Allergies  ?Allergen Reactions  ? Lisinopril Cough  ? ? ?Patient Measurements: ?Height: 5\' 4"  (162.6 cm) ?Weight: 79.8 kg (175 lb 14.8 oz) ?IBW/kg (Calculated) : 54.7 ?Heparin Dosing Weight: 70 kg ? ?Vital Signs: ?Temp: 98 ?F (36.7 ?C) (03/16 0747) ?Temp Source: Axillary (03/16 0747) ?BP: 123/78 (03/16 0747) ?Pulse Rate: 73 (03/16 0747) ? ?Labs: ?Recent Labs  ?  02/28/22 ?0306 03/01/22 ?1802 03/02/22 ?0407  ?HGB  --   --  11.4*  ?HCT  --   --  35.3*  ?PLT  --   --  247  ?APTT  --  85* 105*  ?HEPARINUNFRC  --   --  >1.10*  ?CREATININE 0.98  --   --   ? ? ? ?Estimated Creatinine Clearance: 70.9 mL/min (by C-G formula based on SCr of 0.98 mg/dL). ? ?Assessment: ?63 YOF with bilateral PE on CTA without RHS. Of note, patient was initially admitted 2/19 with ICH - now stable on follow-up imaging. Patient was started on apixaban on 3/11. Apixaban held 3/15 for PEG placement. Last dose of apixaban 02/28/21 PM. Pharmacy consulted for heparin.   ? ?aPTT elevated this AM at 105 seconds ? ? ?Goal of Therapy:  ?Heparin level 0.3-0.5 units/ml ?aPTT 66 to 85 seconds for CVA ?Monitor platelets by anticoagulation protocol: Yes ? ?  ?Plan:  ?Decrease Heparin to 1100 units / hr ?Daily heparin level, CBC, aptt ?Monitor for signs/symptoms of bleeding  ? ? ?Thank you ?Anette Guarneri, PharmD ?Please check AMION for all [place] phone numbers ?After 10:00 PM, call [place] [phone] ? ?

## 2022-03-02 NOTE — Progress Notes (Signed)
Occupational Therapy Treatment ?Patient Details ?Name: Sarah Myers ?MRN: [id number] ?DOB: Apr 26, 1971 ?Today's Date: 03/02/2022 ? ? ?History of present illness 51 y.o. female presents to [facility] hospital on 02/05/2022 with L hemiplegia and dysarthria. 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 Pt intubated 2/19. Attempted extubation 2/23 and 2/26, requiring reintubation same date. Tracheostomy 02/14/2022. PMH includes uncontrolled HTN ?  ?OT comments ? Patient with increased level of alertness this session, and following one step commands 50% of the time with min VC's.  She continues to need up to Max A for basic mobility, Max A for sitting balance, and up to Max A for light grooming for thoroughness with objects placed in her hand at bedlevel.  OT to continue efforts in the acute setting, but SNF for short term rehab trial, and assistance for final disposition recommendations. Goals reviewed and updated, incremental progress to date for all patient focused goals.    ? ?Recommendations for follow up therapy are one component of a multi-disciplinary discharge planning process, led by the attending physician.  Recommendations may be updated based on patient status, additional functional criteria and insurance authorization. ?   ?Follow Up Recommendations ? Skilled nursing-short term rehab (<3 hours/day)  ?  ?Assistance Recommended at Discharge Frequent or constant Supervision/Assistance  ?Patient can return home with the following ? Two people to help with walking and/or transfers;Two people to help with bathing/dressing/bathroom;Assistance with feeding;Help with stairs or ramp for entrance;Assist for transportation;Assistance with cooking/housework;Direct supervision/assist for medications management ?  ?Equipment Recommendations ? Hospital bed;Wheelchair cushion (measurements OT);Wheelchair (measurements OT)  ?  ?Recommendations for Other Services   ? ?  ?Precautions / Restrictions  Precautions ?Precautions: Fall ?Precaution Comments: L neglect, NG tube, trach collar ?Restrictions ?Weight Bearing Restrictions: No  ? ? ?  ? ?Mobility Bed Mobility ?Overal bed mobility: Needs Assistance ?Bed Mobility: Supine to Sit, Sit to Supine ?  ?  ?Supine to sit: Total assist ?Sit to supine: Total assist ?  ?  ?  ? ?Transfers ?  ?  ?  ?  ?  ?  ?  ?  ?  ?  ?  ?  ?Balance Overall balance assessment: Needs assistance ?Sitting-balance support: Feet supported, Single extremity supported ?Sitting balance-Leahy Scale: Zero ?  ?  ?  ?  ?  ?  ?  ?  ?  ?  ?  ?  ?  ?  ?  ?  ?   ? ?ADL either performed or assessed with clinical judgement  ? ?ADL Overall ADL's : Needs assistance/impaired ?Eating/Feeding: Total assistance ?  ?Grooming: Therapist, nutritional;Moderate assistance;Bed level ?  ?Upper Body Bathing: Maximal assistance;Bed level ?  ?Lower Body Bathing: Total assistance;Bed level ?  ?Upper Body Dressing : Maximal assistance;Bed level ?  ?Lower Body Dressing: Total assistance;Bed level ?  ?Toilet Transfer: Total assistance ?  ?  ?  ?  ?  ?  ?  ?  ? ?Extremity/Trunk Assessment Upper Extremity Assessment ?Upper Extremity Assessment: LUE deficits/detail ?RUE Deficits / Details: over and undershooting with R UE ?RUE Coordination: decreased fine motor;decreased gross motor ?LUE Deficits / Details: flaccid throughout LUE, with incresed swelling. ?LUE Sensation: decreased light touch ?LUE Coordination: decreased fine motor;decreased gross motor ?  ?Lower Extremity Assessment ?Lower Extremity Assessment: Defer to PT evaluation ?  ?  ?  ? ?Vision   ?Additional Comments: Patient ot making eye cantact or tracking OT in the room ?  ?Perception Perception ?Perception: Impaired ?Inattention/Neglect: Does  not attend to left side of body ?  ?Praxis   ?  ? ?Cognition Arousal/Alertness: Awake/alert ?Behavior During Therapy: Flat affect ?  ?Area of Impairment: Following commands, Attention, Orientation ?  ?  ?  ?  ?  ?  ?  ?  ?  ?Current  Attention Level: Selective ?  ?Following Commands: Follows one step commands inconsistently ?Safety/Judgement: Decreased awareness of deficits ?  ?Problem Solving: Slow processing, Requires tactile cues, Requires verbal cues ?  ?  ?  ?   ?Exercises General Exercises - Upper Extremity ?Shoulder Flexion: PROM, Left ?Shoulder Extension: PROM, Left ?Shoulder ABduction: PROM, Left ?Shoulder ADduction: PROM, Left ?Elbow Flexion: PROM, Left ?Elbow Extension: PROM, Left ?Wrist Flexion: PROM, Left ?Wrist Extension: PROM, Left ?Digit Composite Flexion: PROM, Left ?Composite Extension: PROM, Left ? ?  ?Shoulder Instructions   ? ? ?  ?General Comments BP 130's'100  ? ? ?Pertinent Vitals/ Pain       Pain Assessment ?Pain Assessment: Faces ?Faces Pain Scale: Hurts a little bit ?Pain Location: L chest/shoulder ?Pain Descriptors / Indicators: Restless ?Pain Intervention(s): Monitored during session ? ?   ?  ?  ?  ?  ?  ?  ?  ?  ?  ?  ?  ?  ?  ?  ?  ?  ?  ?  ? ?  ?    ?  ?  ?  ?   ? ?Frequency ? Min 2X/week  ? ? ? ? ?  ?Progress Toward Goals ? ?OT Goals(current goals can now be found in the care plan section) ?   ? ?Acute Rehab OT Goals ?OT Goal Formulation: Patient unable to participate in goal setting ?Time For Goal Achievement: 03/15/22 ?Potential to Achieve Goals: Fair ?ADL Goals ?Pt Will Perform Grooming: with min assist;sitting ?Additional ADL Goal #1: Patient will perfrom supine to sit with Mod A to increase independence with toileting. ?Additional ADL Goal #2: Patient will follow two step commands 50% of the time with Min VC's  ?Plan Discharge plan remains appropriate   ? ?Co-evaluation ? ? ?   ?  ?  ?  ?  ? ?  ?AM-PAC OT "6 Clicks" Daily Activity     ?Outcome Measure ? ? Help from another person eating meals?: Total ?Help from another person taking care of personal grooming?: A Lot ?Help from another person toileting, which includes using toliet, bedpan, or urinal?: Total ?Help from another person bathing (including  washing, rinsing, drying)?: A Lot ?Help from another person to put on and taking off regular upper body clothing?: A Lot ?Help from another person to put on and taking off regular lower body clothing?: Total ?6 Click Score: 9 ? ?  ?End of Session Equipment Utilized During Treatment: Oxygen ? ?OT Visit Diagnosis: Muscle weakness (generalized) (M62.81);Other symptoms and signs involving cognitive function;Hemiplegia and hemiparesis ?Hemiplegia - Right/Left: Left ?Hemiplegia - dominant/non-dominant: Dominant ?Hemiplegia - caused by: Nontraumatic intracerebral hemorrhage ?  ?Activity Tolerance Patient tolerated treatment well ?  ?Patient Left in bed;with call bell/phone within reach;with restraints reapplied ?  ?Nurse Communication Mobility status ?  ? ?   ? ?Time: 0998-3382 ?OT Time Calculation (min): 27 min ? ?Charges: OT General Charges ?$OT Visit: 1 Visit ?OT Treatments ?$Self Care/Home Management : 23-37 mins ? ?03/02/2022 ? ?RP, OTR/L ? ?Acute Rehabilitation Services ? ?Office:  [phone] ? ? ?Soleia Badolato D Jibran Crookshanks ?03/02/2022, 4:02 PM ? ? ?

## 2022-03-02 NOTE — Progress Notes (Signed)
Speech Language Pathology Treatment: [name] Speaking valve  ?Patient Details ?Name: Sarah Myers ?MRN: [id number] ?DOB: August 16, 1971 ?Today's Date: 03/02/2022 ?Time: 2952-8413 ?SLP Time Calculation (min) (ACUTE ONLY): 20 min ? ?Assessment / Plan / Recommendation ?Clinical Impression ? Patient seen by SLP for skilled treatment focused on PMV toleration. RT has changed patient's trach from #6 cuffed to #4 cuffless. Patient was more cooperative today with oral care and was overall more alert but continues to push away and resist. She tolerated PMV placed for increments of 1-2 minutes. Back pressure observed when PMV removed. SLP was able to vocalized when cued with PMV in place. RR and Oxygen saturations both remained WFL throughout PMV usage. SLP recommending to continue with PMV with SLP only at this time.  Palliative care meeting to take place today with patient's children and palliative care NP. SLP to continue to follow patient for PMV use and PO trials when appropriate. ? ?  ?HPI HPI: 51 y/o female came to the [facility] ER on 2/19 with left sided weakness and slurred speech.  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 Intubated 2/19. Trach changed from #6 cuffed to #6 cuffless on 3/16. ?  ?   ?SLP Plan ? Continue with current plan of care ? ?  ?  ?Recommendations for follow up therapy are one component of a multi-disciplinary discharge planning process, led by the attending physician.  Recommendations may be updated based on patient status, additional functional criteria and insurance authorization. ?  ? ?Recommendations  ?   ?   ? Patient may use Passy-Muir Speech Valve: with SLP only ?PMSV Supervision: Full ?MD: Please consider changing trach tube to : Other (comment) (changed 3/16 to #4 cuffless)  ?   ? ? ? ? Oral Care Recommendations: Oral care QID;Staff/trained caregiver to provide oral care ?Follow Up Recommendations: Skilled nursing-short term rehab (<3  hours/day) ?Assistance recommended at discharge: Frequent or constant Supervision/Assistance ?SLP Visit Diagnosis: Aphonia (R49.1) ?Plan: Continue with current plan of care ? ? ? ? ?  ?  ? ?Angela Nevin, MA, CCC-SLP ?Speech Therapy ? ?

## 2022-03-03 LAB — GLUCOSE, CAPILLARY
Glucose-Capillary: 128 mg/dL — ABNORMAL HIGH (ref 70–99)
Glucose-Capillary: 138 mg/dL — ABNORMAL HIGH (ref 70–99)
Glucose-Capillary: 138 mg/dL — ABNORMAL HIGH (ref 70–99)
Glucose-Capillary: 174 mg/dL — ABNORMAL HIGH (ref 70–99)
Glucose-Capillary: 138 mg/dL — ABNORMAL HIGH (ref 70–99)
Glucose-Capillary: 150 mg/dL — ABNORMAL HIGH (ref 70–99)

## 2022-03-03 LAB — CBC
HCT: 35.8 % — ABNORMAL LOW (ref 36.0–46.0)
Hemoglobin: 11.9 g/dL — ABNORMAL LOW (ref 12.0–15.0)
MCH: 29.4 pg (ref 26.0–34.0)
MCHC: 33.2 g/dL (ref 30.0–36.0)
MCV: 88.4 fL (ref 80.0–100.0)
Platelets: 269 10*3/uL (ref 150–400)
RBC: 4.05 MIL/uL (ref 3.87–5.11)
RDW: 14.1 % (ref 11.5–15.5)
WBC: 6.8 10*3/uL (ref 4.0–10.5)
nRBC: 0 % (ref 0.0–0.2)

## 2022-03-03 LAB — HEPARIN LEVEL (UNFRACTIONATED): Heparin Unfractionated: 0.99 IU/mL — ABNORMAL HIGH (ref 0.30–0.70)

## 2022-03-03 LAB — APTT: aPTT: 74 seconds — ABNORMAL HIGH (ref 24–36)

## 2022-03-03 MED ORDER — HEPARIN (PORCINE) 25000 UT/250ML-% IV SOLN
1150.0000 [IU]/h | INTRAVENOUS | Status: AC
  Administered 2022-03-04 – 2022-03-05 (×2): 1100 [IU]/h via INTRAVENOUS
  Filled 2022-03-03 (×2): qty 250

## 2022-03-03 NOTE — H&P (Signed)
Chief Complaint: Patient was seen in consultation today for gastrostomy tube placement   at the request of Dorcas Carrow, MD   Referring Physician(s): Dorcas Carrow, MD  Supervising Physician: Gilmer Mor  Patient Status: [facility] - In-pt  History of Present Illness: FLOR[place] MIZRAHI is a 51 y.o. female w/ PMH of HTN. Pt presented to ED 02/05/22 c/o L sided weakness and slurred speech. 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 Due to pt inability to take anything by mouth, pt referred to IR for gastrostomy tube placement by Dr. Corrie Mckusick. Pt currently has Dobbhoff in place for nutrition. Procedure is tentatively scheduled for 03/06/22.   Past Medical History:  Diagnosis Date   Hypertension     Past Surgical History:  Procedure Laterality Date   BREAST LUMPECTOMY  1992   CESAREAN SECTION     ORIF RADIAL FRACTURE  02/10/2012   Procedure: OPEN REDUCTION INTERNAL FIXATION (ORIF) RADIAL FRACTURE;  Surgeon: Cammy Copa, MD;  Location: [facility] OR;  Service: Orthopedics;  Laterality: Right;   ORIF ULNAR FRACTURE  02/10/2012   Procedure: OPEN REDUCTION INTERNAL FIXATION (ORIF) ULNAR FRACTURE;  Surgeon: Cammy Copa, MD;  Location: [facility] OR;  Service: Orthopedics;  Laterality: Right;   TUBAL LIGATION      Allergies: Lisinopril  Medications: Prior to Admission medications   Medication Sig Start Date End Date Taking? Authorizing Provider  carvedilol (COREG) 25 MG tablet TAKE 1 TABLET BY MOUTH TWICE DAILY WITH A MEAL Patient taking differently: Take 25 mg by mouth 2 (two) times daily with a meal. 01/13/22  Yes [name], MD  hydrochlorothiazide (HYDRODIURIL) 25 MG tablet Take 1 tablet by mouth once daily 12/14/21  Yes [name], MD     Family History  Problem Relation Age of Onset   Diabetes Mother    Hypertension Mother    Heart disease Mother    Diabetes Maternal Aunt    Stroke Maternal Uncle    Diabetes Maternal Grandmother    Heart disease Maternal  Grandmother    Diabetes Maternal Grandfather    Heart disease Maternal Grandfather    Heart disease Paternal Grandmother    Heart disease Paternal Grandfather     Social History   Socioeconomic History   Marital status: Married    Spouse name: Not on file   Number of children: Not on file   Years of education: Not on file   Highest education level: Not on file  Occupational History   Not on file  Tobacco Use   Smoking status: Never   Smokeless tobacco: Never  Vaping Use   Vaping Use: Never used  Substance and Sexual Activity   Alcohol use: Not Currently    Alcohol/week: 0.0 standard drinks   Drug use: No   Sexual activity: Never  Other Topics Concern   Not on file  Social History Narrative   Not on file   Social Determinants of Health   Financial Resource Strain: Not on file  Food Insecurity: Not on file  Transportation Needs: Not on file  Physical Activity: Not on file  Stress: Not on file  Social Connections: Not on file    Review of Systems: A 12 point ROS discussed and pertinent positives are indicated in the HPI above.  All other systems are negative.  Review of Systems  Unable to perform ROS: Acuity of condition   Vital Signs: BP 126/72 (BP Location: Right Arm)   Pulse 65   Temp 97.8  F (36.6 C) (Axillary)   Resp 17   Ht 5\' 4"  (1.626 m)   Wt 160 lb 0.9 oz (72.6 kg)   SpO2 100%   BMI 27.47 kg/m   Physical Exam Constitutional:      Appearance: She is ill-appearing.  HENT:     Head: Normocephalic and atraumatic.     Mouth/Throat:     Comments: Tracheostomy in place Cardiovascular:     Rate and Rhythm: Normal rate and regular rhythm.  Pulmonary:     Effort: Pulmonary effort is normal. No respiratory distress.     Breath sounds: Rhonchi present.     Comments: Rhonchi through all fields Abdominal:     General: Bowel sounds are normal.     Palpations: Abdomen is soft.  Musculoskeletal:     Right lower leg: No edema.     Left lower leg: No  edema.  Skin:    General: Skin is warm and dry.  Neurological:     Comments: Pt appears agitated. She is not responsive to voice/touch    Imaging: CT ABDOMEN WO CONTRAST  Result Date: 03/02/2022 CLINICAL DATA:  Peg placement screening. EXAM: CT ABDOMEN WITHOUT CONTRAST TECHNIQUE: Multidetector CT imaging of the abdomen was performed following the standard protocol without IV contrast. RADIATION DOSE REDUCTION: This exam was performed according to the departmental dose-optimization program which includes automated exposure control, adjustment of the mA and/or kV according to patient size and/or use of iterative reconstruction technique. COMPARISON:  CT February 21, 2022 and February 10, 2012 FINDINGS: Lower chest: Trace bilateral pleural effusions with adjacent airspace consolidations. Cardiomegaly. Hepatobiliary: Unremarkable noncontrast appearance of the hepatic parenchyma. Cholelithiasis without findings of acute cholecystitis. No biliary ductal dilation. Pancreas: No pancreatic ductal dilation or evidence of acute inflammation. Spleen: No splenomegaly or focal splenic lesion. Adrenals/Urinary Tract: Bilateral adrenal glands appear normal. No hydronephrosis. No nephrolithiasis. Stomach/Bowel: No enteric contrast was administered. Weighted enteric feeding catheter with tip in the proximal stomach near or at the GE junction. There are no interposed loops of bowel anterior to the lower gastric fundus/antrum. No pathologic dilation or evidence of acute inflammation involving loops of large or small bowel in the abdomen. Moderate volume of formed stool in the colon. Vascular/Lymphatic: Normal caliber abdominal aorta. No pathologically enlarged abdominal lymph nodes. Other: No significant abdominal free fluid. Musculoskeletal: No acute osseous abnormality. IMPRESSION: 1. Weighted enteric feeding catheter with tip in the proximal stomach near or at the GE junction, recommend advancement. 2. There are no interposed  loops of bowel or solid organ anterior to the lower gastric fundus/antrum. 3. Cholelithiasis without findings of acute cholecystitis. 4. Trace bilateral pleural effusions with adjacent airspace consolidations. Electronically Signed   By: Maudry Mayhew M.D.   On: 03/02/2022 07:36   DG Abd 1 View  Result Date: 02/09/2022 CLINICAL DATA:  Feeding tube placement EXAM: ABDOMEN - 1 VIEW COMPARISON:  None. FINDINGS: NG tube enters the stomach with the tip in the mid body of the stomach. Normal bowel gas pattern. IMPRESSION: NG tip in the body the stomach. Electronically Signed   By: Marlan Palau M.D.   On: 02/09/2022 12:56   CT HEAD WO CONTRAST ( )  Result Date: 02/26/2022 CLINICAL DATA:  Follow-up examination for intracranial hemorrhage, stroke. EXAM: CT HEAD WITHOUT CONTRAST TECHNIQUE: Contiguous axial images were obtained from the base of the skull through the vertex without intravenous contrast. RADIATION DOSE REDUCTION: This exam was performed according to the departmental dose-optimization program which includes automated exposure control, adjustment  of the mA and/or kV according to patient size and/or use of iterative reconstruction technique. COMPARISON:  Prior CT from 02/22/2022. FINDINGS: Brain: Previously identified intraparenchymal hematoma centered at the right lentiform nucleus again seen. Hematoma is slightly decreased in size from previous, now measuring 3.7 x 2.3 x 1.8 cm. Surrounding vasogenic edema appears slightly increased and more hypodense as compared to prior. Similar regional mass effect with partial effacement of the right lateral ventricle and up to 7 mm of right-to-left shift. No progressive hydrocephalus or ventricular trapping. Basilar cisterns remain patent. Probable evolving changes of wallerian degeneration at the right cerebral peduncle. No new intracranial hemorrhage or large vessel territory infarct. No mass lesion or extra-axial fluid collection. Vascular: No hyperdense  vessel. Skull: Scalp soft tissues and calvarium demonstrate no new finding. Sinuses/Orbits: Globes orbital soft tissues within normal limits. Nasogastric tube in place. Sphenoid ethmoidal sinus disease noted. Right mastoid and middle ear effusion, with trace left mastoid effusion. Appearance is similar. Other: None. IMPRESSION: 1. Continued normal expected interval evolution of intraparenchymal hematoma centered at the right lentiform nucleus, slightly decreased in size now measuring 3.7 x 2.3 x 1.8 cm. Surrounding vasogenic edema appears slightly increased and more hypodense as compared to prior. Similar regional mass effect with 7 mm of right-to-left shift. No progressive hydrocephalus or ventricular trapping. 2. No other new acute intracranial abnormality. Electronically Signed   By: Rise Mu M.D.   On: 02/26/2022 19:26   CT HEAD WO CONTRAST ( )  Result Date: 02/22/2022 CLINICAL DATA:  Follow-up hemorrhagic stroke. Now with pulmonary emboli EXAM: CT HEAD WITHOUT CONTRAST TECHNIQUE: Contiguous axial images were obtained from the base of the skull through the vertex without intravenous contrast. RADIATION DOSE REDUCTION: This exam was performed according to the departmental dose-optimization program which includes automated exposure control, adjustment of the mA and/or kV according to patient size and/or use of iterative reconstruction technique. COMPARISON:  02/12/2022 FINDINGS: Brain: Decreased volume and more hazy/indistinct hematoma in the deep right cerebrum at the posterior putamen and adjacent white matter, high-density measuring up to 3 cm x 2 cm on axial slices. Adjacent edema combines to cause 6 mm of midline shift. Small left inferior occipital infarcts, newly seen by CT. No hydrocephalus or extra-axial collection. Vascular: No hyperdense vessel or unexpected calcification. Skull: Normal. Negative for fracture or focal lesion. Sinuses/Orbits: Chronic sinusitis and bilateral cribriform  plate cephalocele as previously reported. Right mastoid and middle ear opacification in the setting of nasal intubation. IMPRESSION: 1. Expected regression of right ICH. Residual hematoma and swelling causes 6 mm of midline shift. 2. Small left occipital cortex infarcts which have become apparent by CT. Electronically Signed   By: Tiburcio Pea M.D.   On: 02/22/2022 10:26   CT HEAD WO CONTRAST ( )  Result Date: 02/12/2022 CLINICAL DATA:  Stroke follow-up EXAM: CT HEAD WITHOUT CONTRAST TECHNIQUE: Contiguous axial images were obtained from the base of the skull through the vertex without intravenous contrast. RADIATION DOSE REDUCTION: This exam was performed according to the departmental dose-optimization program which includes automated exposure control, adjustment of the mA and/or kV according to patient size and/or use of iterative reconstruction technique. COMPARISON:  Two days ago FINDINGS: Brain: Right basal ganglia and temporal lobe hematoma has unchanged shape and extent with rim of edema causing local mass effect and 4 mm of midline shift. Hematoma measures up to 4.5 x 3.5 cm on coronal reformats, unchanged when remeasured on the same level at prior. No complicating infarct or hydrocephalus. No extra-axial  collection. Vascular: Negative Skull: Bilateral cribriform plate cephalocele as noted previously. No acute finding Sinuses/Orbits: Sinusitis with nasal and nasopharyngeal fluid in the setting of intubation. IMPRESSION: 1. ICH without rebleeding. Hematoma and edema causes 4 mm of midline shift. 2. Bilateral cribriform plate cephalocele. Electronically Signed   By: Tiburcio Pea M.D.   On: 02/12/2022 06:34   CT HEAD WO CONTRAST ( )  Result Date: 02/10/2022 CLINICAL DATA:  ICH follow-up EXAM: CT HEAD WITHOUT CONTRAST TECHNIQUE: Contiguous axial images were obtained from the base of the skull through the vertex without intravenous contrast. RADIATION DOSE REDUCTION: This exam was performed  according to the departmental dose-optimization program which includes automated exposure control, adjustment of the mA and/or kV according to patient size and/or use of iterative reconstruction technique. COMPARISON:  02/06/2022 FINDINGS: Brain: Given differences in scan angle and head rotation unchanged size of parenchymal hemorrhage centered in the right temporal white matter and putamen with rim of edema. Trace intraventricular extension into the occipital horns is unchanged. Local mass effect without significant midline shift. No evidence of cortical infarct or new hemorrhage Vascular: No hyperdense vessel or unexpected calcification. Skull: Normal. Negative for fracture or focal lesion. Sinuses/Orbits: Progressive sinus opacification in the setting of intubation. Bilateral cribriform plate cephaloceles seen on coronal T2 weighted imaging. IMPRESSION: 1. No progression of the right cerebral ICH with small volume intraventricular extension. No new abnormality. 2. Bilateral cribriform plate cephalocele. 3. Progressive sinus inflammation in the setting of intubation. Electronically Signed   By: Tiburcio Pea M.D.   On: 02/10/2022 11:08   CT HEAD WO CONTRAST ( )  Result Date: 02/06/2022 CLINICAL DATA:  Provided history: Stroke, hemorrhagic stroke, follow-up. EXAM: CT HEAD WITHOUT CONTRAST TECHNIQUE: Contiguous axial images were obtained from the base of the skull through the vertex without intravenous contrast. RADIATION DOSE REDUCTION: This exam was performed according to the departmental dose-optimization program which includes automated exposure control, adjustment of the mA and/or kV according to patient size and/or use of iterative reconstruction technique. COMPARISON:  Prior head CT examinations 02/06/2022 and earlier. FINDINGS: Brain: An acute parenchymal hemorrhage centered within the right lentiform nucleus and also extending into the anterior right temporal lobe, has not significantly changed in  size as compared to the head CT performed earlier today at 1:05 a.m. As before, the dominant component of the hemorrhage measures 4.6 x 3.6 x 4.6 cm (remeasured on prior). Mild surrounding edema is also unchanged. Regional mass effect with partial effacement of the right lateral ventricle is unchanged. 4 mm midline shift measured at the level of the septum pellucidum is unchanged. Stable small volume intraventricular hemorrhage within the occipital horns of both lateral ventricles. No evidence of hydrocephalus. The basal cisterns remain patent. Mild patchy and ill-defined hypoattenuation within the cerebral white matter elsewhere, nonspecific but compatible with chronic small vessel ischemic disease. Partially empty sella turcica. No appreciable intracranial mass. Vascular: No hyperdense vessel. Skull: Normal. Negative for fracture or focal lesion. Sinuses/Orbits: Visualized orbits show no acute finding. Frothy secretions within the right sphenoid sinus. Trace mucosal thickening within the left sphenoid sinus. 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 IMPRESSION: Unchanged 4.6 x 3.6 x 4.6 cm acute parenchymal hemorrhage centered within the right lentiform nucleus, also extending into the anterior right temporal lobe. Stable surrounding edema and regional mass effect with partial effacement of the fourth ventricle and 4 mm leftward midline shift. Unchanged small-volume intraventricular hemorrhage within the occipital horns of both lateral ventricles. It is unclear if this reflects true intraventricular extension or redistribution.  No evidence of hydrocephalus or ventricular entrapment. Electronically Signed   By: Jackey Loge D.O.   On: 02/06/2022 09:05   CT HEAD WO CONTRAST ( )  Result Date: 02/06/2022 CLINICAL DATA:  Follow-up examination for hemorrhagic stroke. EXAM: CT HEAD WITHOUT CONTRAST TECHNIQUE: Contiguous axial images were obtained from the base of the skull through the vertex without  intravenous contrast. RADIATION DOSE REDUCTION: This exam was performed according to the departmental dose-optimization program which includes automated exposure control, adjustment of the mA and/or kV according to patient size and/or use of iterative reconstruction technique. COMPARISON:  CT from 02/05/2022. FINDINGS: Brain: Previously identified intraparenchymal hematoma centered at the right lentiform nucleus/external capsule again seen. Hemorrhage measures 4.2 x 3.2 x 5.3 cm in greatest dimensions (estimated volume 36 mL). This is mildly increased in size as compared to previous (previously measuring approximately 30 mL on prior CT when measured in a similar fashion). Surrounding vasogenic edema with regional mass effect. Partial effacement of the right lateral ventricle with up to 4 mm of right-to-left shift. Trace intraventricular hemorrhage seen within the occipital horns of both lateral ventricles, slightly increased as compared to previous. Unclear whether this reflects true intraventricular extension and/or redistribution. No hydrocephalus or trapping. Basilar cisterns remain patent. Empty sella noted. No new intracranial hemorrhage or large vessel territory infarct. No extra-axial collection. No visible mass lesion. Vascular: No hyperdense vessel. Skull: Scalp soft tissues and calvarium within normal limits. Sinuses/Orbits: Globes and orbital soft tissues demonstrate no acute finding. 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 Paranasal sinuses and mastoid air cells otherwise remain largely clear. Endotracheal tube partially visualized. Other: None. IMPRESSION: 1. Slight interval increase in size of intraparenchymal hematoma centered at the right lentiform nucleus, now measuring 4.2 x 3.2 x 5.3 cm (estimated volume 36 mL). Surrounding vasogenic edema with regional mass effect with 4 mm of right-to-left shift. 2. Trace intraventricular hemorrhage within the occipital horns of both lateral  ventricles, slightly increased as compared to previous. Unclear whether this reflects true intraventricular extension and/or redistribution. No hydrocephalus or trapping. 3. No other new acute intracranial abnormality. Electronically Signed   By: Rise Mu M.D.   On: 02/06/2022 01:52   CT HEAD WO CONTRAST ( )  Result Date: 02/05/2022 CLINICAL DATA:  Stroke follow-up, assess stability of intracranial hemorrhage. Follow-up bleed. EXAM: CT HEAD WITHOUT CONTRAST TECHNIQUE: Contiguous axial images were obtained from the base of the skull through the vertex without intravenous contrast. RADIATION DOSE REDUCTION: This exam was performed according to the departmental dose-optimization program which includes automated exposure control, adjustment of the mA and/or kV according to patient size and/or use of iterative reconstruction technique. COMPARISON:  Head CT earlier today, proximally 6-1/2 hours ago. FINDINGS: Brain: Acute hemorrhage centered in the right lentiform nucleus is not significantly changed in size from prior exam allowing for differences in scan plane. This measures approximately 4.2 x 2.4 x 4.7 cm measured on series 3, image 13 and series 5, image 36. Similar degree of surrounding edema. There is similar effacement of the right lateral ventricle. No progressive ventriculomegaly. Right to left midline shift of 4 mm, also not significantly changed. No intraventricular subdural extension. No new sites of hemorrhage. Partially empty sella. Vascular: Persistent but decreased residual contrast within intracranial vasculature from CTA earlier today. Skull: No fracture or focal lesion. Sinuses/Orbits: Stable appearance of opacification of ethmoid air cells and possible chronic meningocele. Other: None. IMPRESSION: 1. Unchanged size of right lentiform nucleus hemorrhage with mild surrounding edema and mass effect. Unchanged 4 mm  right to left midline shift. No new sites of hemorrhage. 2. 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 Electronically Signed   By: Narda Rutherford M.D.   On: 02/05/2022 18:27   CT Head Wo Contrast  Result Date: 02/05/2022 CLINICAL DATA:  Stroke, follow up eval extension of bleeding EXAM: CT HEAD WITHOUT CONTRAST TECHNIQUE: Contiguous axial images were obtained from the base of the skull through the vertex without intravenous contrast. RADIATION DOSE REDUCTION: This exam was performed according to the departmental dose-optimization program which includes automated exposure control, adjustment of the mA and/or kV according to patient size and/or use of iterative reconstruction technique. COMPARISON:  Same day CT head FINDINGS: Brain: When accounting for differences in technique, no substantial change acute intraparenchymal hemorrhage centered in the right lentiform nucleus. Similar surrounding edema and mass effect with approximately 4-5 mm of leftward midline shift. Similar effacement of the right lateral ventricle. No progressive ventriculomegaly. Intraparenchymal hemorrhage comes in close proximity to the right lateral ventricle without evidence of intraventricular extension at this time. Residual contrast within small vessels in the subarachnoid space, which limits evaluation for small volume subarachnoid hemorrhage. No evidence of acute large vascular territory infarct. Partially empty sella. Prominent Meckel's caves. Vascular: Residual contrast within vessels, presumably from same day CTA. Skull: No evidence of acute fracture. Sinuses/Orbits: Probable defect in the factory fossa with suspected herniation of adjacent frontal lobes through the defect into posterior ethmoid air cells, suspicious for frontoethmoidal meningocele. Surrounding bony sclerosis. Other: No mastoid effusions IMPRESSION: 1. No substantial change in acute intraparenchymal hemorrhage centered in the right lentiform nucleus. 2. Similar surrounding edema and mass effect with  approximately 4-5 mm of leftward midline shift. 3. 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 Constellation of findings can be seen with idiopathic intracranial hypertension. Follow-up MRI could further characterize. Electronically Signed   By: Feliberto Harts M.D.   On: 02/05/2022 10:01   CT Angio Chest Pulmonary Embolism (PE) W or WO Contrast  Result Date: 02/21/2022 CLINICAL DATA:  Positive D-dimer and difficulty breathing EXAM: CT ANGIOGRAPHY CHEST WITH CONTRAST TECHNIQUE: Multidetector CT imaging of the chest was performed using the standard protocol during bolus administration of intravenous contrast. Multiplanar CT image reconstructions and MIPs were obtained to evaluate the vascular anatomy. RADIATION DOSE REDUCTION: This exam was performed according to the departmental dose-optimization program which includes automated exposure control, adjustment of the mA and/or kV according to patient size and/or use of iterative reconstruction technique. CONTRAST:  60mL OMNIPAQUE IOHEXOL 350 MG/ML SOLN COMPARISON:  Chest x-ray from the previous day. FINDINGS: Cardiovascular: Thoracic aorta and its branches are within normal limits. No aneurysmal dilatation or dissection is noted. Heart is at the upper limits of normal in size. Pulmonary artery shows a normal branching pattern bilaterally. There are adherent peripheral filling defects identified primarily within the left lower lobe pulmonary artery. Additionally some lower lobe filling defects are noted on the right as well as upper lobe emboli on the right. No coronary calcifications are seen. Mediastinum/Nodes: Thoracic inlet demonstrates evidence of a tracheostomy tube in satisfactory position. Thyroid is unremarkable. Feeding catheter is noted within the esophagus extending into the stomach. No sizable hilar or mediastinal adenopathy is noted. Lungs/Pleura: Lungs are well aerated bilaterally with  the exception of consolidation in the lower lobes bilaterally with small effusions. No sizable parenchymal nodules are seen. No pneumothorax is identified. Upper Abdomen: Visualized upper abdomen is within normal limits. Musculoskeletal: No chest wall abnormality. No acute  or significant osseous findings. Review of the MIP images confirms the above findings. IMPRESSION: Bilateral pulmonary emboli as described above without evidence of right heart strain. Bilateral lower lobe consolidation with associated small effusions. These results will be called to the ordering clinician or representative by the Radiologist Assistant, and communication documented in the PACS or [employer]. Electronically Signed   By: Alcide Clever M.D.   On: 02/21/2022 19:48   MR BRAIN WO CONTRAST  Result Date: 02/07/2022 CLINICAL DATA:  Neuro deficit, acute, stroke suspected; intracranial hemorrhage follow-up EXAM: MRI HEAD WITHOUT CONTRAST TECHNIQUE: Multiplanar, multiecho pulse sequences of the brain and surrounding structures were obtained without intravenous contrast. COMPARISON:  Correlation made with recent CT imaging FINDINGS: Brain: Heterogeneous but primarily T2 hypointense and T1 isointense hematoma identified involving the right basal ganglia, surrounding white matter, and extending into the temporal lobe. Some T1 hyperintensity is present. Surrounding edema is noted with regional mass effect. Partial effacement of the adjacent ventricles with stable mild leftward midline shift. No hydrocephalus. As noted on CT, there is are intraventricular blood products within the occipital horns reflecting extension from above hematoma. No evidence of prior hemorrhage remote from above. No extra-axial collection. Patchy foci of T2 hyperintensity in the supratentorial white matter nonspecific but probably reflect chronic microvascular ischemic changes. Vascular: Major vessel flow voids at the skull base are preserved. Skull and upper  cervical spine: Normal marrow signal is preserved. Sinuses/Orbits: Paranasal sinus mucosal thickening. Orbits are unremarkable. Other: Sella is unremarkable.  Patchy mastoid fluid opacification. IMPRESSION: Acute to early subacute large parenchymal hemorrhage as seen on recent CT imaging with similar surrounding edema and regional mass effect. Intraventricular extension is again noted. No hydrocephalus. No evidence of prior hemorrhage elsewhere. No contrast administered to assess for underlying lesion. Electronically Signed   By: Guadlupe Spanish M.D.   On: 02/07/2022 17:38   US RENAL  Result Date: 02/08/2022 CLINICAL DATA:  Oliguria EXAM: RENAL / URINARY TRACT ULTRASOUND COMPLETE COMPARISON:  CT done on 02/10/2012 FINDINGS: Right Kidney: Renal measurements: 11.3 x 3.1 x 5.5 cm = volume: 96.9 mL. Echogenicity within normal limits. No mass or hydronephrosis visualized. Left Kidney: Renal measurements: 10.5 x 4.4 x 6.2 cm = volume: 149.2 mL. Echogenicity within normal limits. No mass or hydronephrosis visualized. Bladder: Urinary bladder is empty with indwelling Foley catheter limiting evaluation. Other: Incidental note is made of fatty infiltration in the liver. IMPRESSION: No significant sonographic abnormality is seen in the kidneys. Electronically Signed   By: Ernie Avena M.D.   On: 02/08/2022 13:37   DG CHEST PORT 1 VIEW  Result Date: 02/24/2022 CLINICAL DATA:  Hemoptysis. EXAM: PORTABLE CHEST 1 VIEW COMPARISON:  February 20, 2022. FINDINGS: Stable cardiomegaly. Tracheostomy and feeding tubes are unchanged in position. Both lungs are clear. The visualized skeletal structures are unremarkable. IMPRESSION: Stable support apparatus. No acute cardiopulmonary abnormality is seen. Electronically Signed   By: Lupita Raider M.D.   On: 02/24/2022 11:08   DG Chest Port 1 View  Result Date: 02/20/2022 CLINICAL DATA:  Fever EXAM: PORTABLE CHEST 1 VIEW COMPARISON:  02/18/2022, 02/14/2022 FINDINGS: Tracheostomy  tube remains in place. Right upper extremity central venous catheter tip at the cavoatrial region. Esophageal tube tip below the diaphragm but incompletely visualized. Borderline to mild cardiomegaly. Low lung volume with central bronchovascular crowding. Patchy airspace disease at the left base. No pneumothorax. Overall aeration is improved compared to the prior exams. IMPRESSION: 1. Patchy left greater than right airspace disease which may be  due to atelectasis or pneumonia, overall aeration is improved compared to prior. 2. Borderline to mild cardiomegaly. Electronically Signed   By: Jasmine Pang M.D.   On: 02/20/2022 16:15   DG CHEST PORT 1 VIEW  Result Date: 02/18/2022 CLINICAL DATA:  Unresponsive.  Shortness of breath. EXAM: PORTABLE CHEST 1 VIEW COMPARISON:  02/14/2022 FINDINGS: Right arm PICC line tip is at the cavoatrial junction. Stable cardiomediastinal contours. Right lung clear. Persistent airspace opacification within the left lower lung. Increase hazy opacity within the left upper lobe. IMPRESSION: 1. Persistent airspace opacification within the left lower lung. 2. Increased hazy opacification within the left upper lobe. Electronically Signed   By: Signa Kell M.D.   On: 02/18/2022 08:30   DG Chest Port 1 View  Result Date: 02/14/2022 CLINICAL DATA:  Post tracheostomy EXAM: PORTABLE CHEST 1 VIEW COMPARISON:  02/12/2022 FINDINGS: Interval placement of tracheostomy in good position. No pneumothorax or subcutaneous gas Right-sided central line tip in the SVC. Feeding tube enters the stomach with the tip not visualized Progressive left lower lobe consolidation which may be collapse or pneumonia. Mild airspace disease right lung base unchanged. No significant pleural effusion IMPRESSION: Satisfactory tracheostomy placement.  No pneumothorax Progressive left lower lobe consolidation. Electronically Signed   By: Marlan Palau M.D.   On: 02/14/2022 14:07   DG CHEST PORT 1 VIEW  Result Date:  02/12/2022 CLINICAL DATA:  Intubation EXAM: PORTABLE CHEST 1 VIEW COMPARISON:  Radiograph 02/09/2022 FINDINGS: Endotracheal tube 1.8 cm from carina. NG tube extends the stomach. RIGHT PICC line with tip in distal SVC normal cardiac silhouette. RIGHT basilar atelectasis and small effusion unchanged. IMPRESSION: Support apparatus in good position. RIGHT basilar atelectasis and small effusion Electronically Signed   By: Genevive Bi M.D.   On: 02/12/2022 15:40   DG Chest Port 1 View  Result Date: 02/09/2022 CLINICAL DATA:  Endotracheal tube placement EXAM: PORTABLE CHEST 1 VIEW COMPARISON:  02/09/2022 FINDINGS: Endotracheal tube remains in good position. NG tube enters the stomach with the tip not visualized. Left neck central venous catheter tip overlies the aortic arch unchanged. It is unclear if this is arterial or venous. Correlate with blood gas. Bibasilar airspace disease left greater than right unchanged. No pneumothorax. Possible small pleural effusions bilaterally. IMPRESSION: Endotracheal tube remains in good position Left neck catheter overlying the aortic arch. It is unclear if this is arterial or venous. Correlate with blood gas. Bibasilar airspace disease left greater than right unchanged. Electronically Signed   By: Marlan Palau M.D.   On: 02/09/2022 12:55   DG Chest Port 1 View  Result Date: 02/09/2022 CLINICAL DATA:  Acute respiratory failure with hypoxia. Endotracheal tube placement. EXAM: PORTABLE CHEST 1 VIEW COMPARISON:  Chest radiograph 02/07/2022 FINDINGS: Endotracheal tube tip projects approximately 2.8 cm above the carina. Left IJ central venous catheter tip projects at the superior aspect of the aortic arch, slightly retracted compared to prior exam on 02/07/2022. Enteric tube tip is below the diaphragm but not included on the image. Low lung volumes with slightly increased retrocardiac opacity in the left lower lobe and mild interstitial thickening in the right lower lobe.  Probable trace left pleural fluid. No pneumothorax. Stable cardiomediastinal silhouette. No acute osseous abnormality. IMPRESSION: Left IJ central venous catheter tip projects at the superior aspect of the aortic arch, slightly retracted compared to prior exam on 02/07/2022. As previously documented, lateral chest radiograph or CT would be helpful to further clarify placement of catheter tip. Slight interval worsening of retrocardiac  left lower lobe airspace opacity and right lower lobe interstitial opacities, concerning for multifocal pneumonia. Electronically Signed   By: Sherron Ales M.D.   On: 02/09/2022 08:58   DG CHEST PORT 1 VIEW  Addendum Date: 02/07/2022   ADDENDUM REPORT: 02/07/2022 08:33 ADDENDUM: These results were called by telephone at the time of interpretation on 02/07/2022 at 8:31 am to provider JD Suzie Portela, PA , who verbally acknowledged these results. Electronically Signed   By: Donzetta Kohut M.D.   On: 02/07/2022 08:33   Result Date: 02/07/2022 CLINICAL DATA:  Aspiration, ET tube placement. EXAM: PORTABLE CHEST 1 VIEW COMPARISON:  Comparison is made with February 06, 2012. FINDINGS: LEFT-sided IJ catheter projects over the aortic arch as before. ETT 3.8 cm from the carina. Gastric tube courses through in off the field of the radiograph, tip off the field of view. Cardiomediastinal contours with stable cardiac enlargement. Hilar structures also stable. Added density along the RIGHT heart border. No frank lobar consolidation. Some increased density in the retrocardiac region slightly increased from previous imaging. No pneumothorax. On limited assessment there is no acute skeletal process. IMPRESSION: 1. LEFT IJ central venous catheter again with uncertain placement. Based on position arterial placement is a concern. Lateral chest correlation or CT may be helpful for further evaluation. 2. Increasing density in the retrocardiac region may represent worsening of basilar airspace disease on the  LEFT also with persistent RIGHT basilar airspace process which is unchanged. 3. A call is out to the referring provider to further discuss findings in the above case. Electronically Signed: By: Donzetta Kohut M.D. On: 02/07/2022 08:20   DG CHEST PORT 1 VIEW  Result Date: 02/05/2022 CLINICAL DATA:  Central line placement EXAM: PORTABLE CHEST 1 VIEW COMPARISON:  Chest CT 02/10/2012 FINDINGS: Endotracheal tube overlies the trachea proximally 2.3 cm above the carina. Orogastric tube passes below the diaphragm, tip excluded by collimation. There is a left neck approach catheter with tip overlying the aortic arch or left brachiocephalic vein. There is no persistent left-sided SVC on prior chest CT. Mildly enlarged cardiac silhouette. Bibasilar opacities, unchanged. No pleural effusion. No visible pneumothorax. No acute osseous abnormality. IMPRESSION: Left neck approach central venous catheter tip overlies the aortic arch or left brachiocephalic vein. This is inconclusive for arterial versus venous placement radiographically. Recommend blood gas analysis and consideration of lateral chest radiograph or CT to confirm placement. Unchanged bibasilar opacities, favored to be atelectasis, aspiration or infection is possible. These results will be called to the ordering clinician or representative by the Radiologist Assistant, and communication documented in the PACS or [employer]. Electronically Signed   By: Caprice Renshaw M.D.   On: 02/05/2022 13:08   DG Chest Portable 1 View  Result Date: 02/05/2022 CLINICAL DATA:  Tube placement EXAM: PORTABLE CHEST 1 VIEW COMPARISON:  Chest x-ray 10/26/2019 FINDINGS: Endotracheal tube tip is 1.7 cm above the carina. Enteric tube tip is below the diaphragm. Cardiomediastinal silhouette is within normal limits. Likely mild subsegmental atelectasis at the lung bases. No pleural effusion or pneumothorax. IMPRESSION: Medical devices as described. Minor basilar subsegmental  atelectasis. Electronically Signed   By: Jannifer Hick M.D.   On: 02/05/2022 09:30   DG Abd Portable 1V  Result Date: 02/10/2022 CLINICAL DATA:  Cortrak feeding tube placement EXAM: PORTABLE ABDOMEN - 1 VIEW COMPARISON:  02/09/2022 FINDINGS: Limited radiograph of the lower chest and upper abdomen was obtained for the purposes of enteric tube localization. Enteric tube is seen coursing below the diaphragm with  distal tip terminating within the expected location of the distal gastric body. IMPRESSION: Enteric tube tip terminating within the expected location of the distal gastric body. Electronically Signed   By: Duanne Guess D.O.   On: 02/10/2022 12:01   ECHOCARDIOGRAM COMPLETE  Result Date: 02/06/2022    ECHOCARDIOGRAM REPORT   Patient Name:   ZOEIE RITTER Date of Exam: 02/06/2022 Medical Rec #:  [id number]           Height:       64.0 in Accession #:    [id number]          Weight:       165.0 lb Date of Birth:  07-05-1971            BSA:          1.803 m Patient Age:    50 years            BP:           110/74 mmHg Patient Gender: F                   HR:           87 bpm. Exam Location:  Inpatient Procedure: 2D Echo, Cardiac Doppler and Color Doppler Indications:    Stroke I63.9  History:        Patient has prior history of Echocardiogram examinations, most                 recent 11/03/2019. Risk Factors:Hypertension.  Sonographer:    Eulah Pont RDCS Referring Phys: [id number] GRACE E BOWSER  Sonographer Comments: Echo performed with patient supine and on artificial respirator. IMPRESSIONS  1. Left ventricular ejection fraction, by estimation, is 65 to 70%. The left ventricle has normal function. The left ventricle has no regional wall motion abnormalities. Left ventricular diastolic parameters were normal.  2. Right ventricular systolic function is normal. The right ventricular size is normal. There is normal pulmonary artery systolic pressure.  3. A small pericardial effusion is present.  4.  The mitral valve is normal in structure. Trivial mitral valve regurgitation. No evidence of mitral stenosis.  5. The aortic valve is normal in structure. Aortic valve regurgitation is not visualized. No aortic stenosis is present. FINDINGS  Left Ventricle: Left ventricular ejection fraction, by estimation, is 65 to 70%. The left ventricle has normal function. The left ventricle has no regional wall motion abnormalities. The left ventricular internal cavity size was normal in size. There is  no left ventricular hypertrophy. Left ventricular diastolic parameters were normal. Right Ventricle: The right ventricular size is normal. Right vetricular wall thickness was not well visualized. Right ventricular systolic function is normal. There is normal pulmonary artery systolic pressure. The tricuspid regurgitant velocity is 2.10 m/s, and with an assumed right atrial pressure of 3 mmHg, the estimated right ventricular systolic pressure is 20.6 mmHg. Left Atrium: Left atrial size was normal in size. Right Atrium: Right atrial size was normal in size. Pericardium: A small pericardial effusion is present. Mitral Valve: The mitral valve is normal in structure. Trivial mitral valve regurgitation. No evidence of mitral valve stenosis. Tricuspid Valve: The tricuspid valve is grossly normal. Tricuspid valve regurgitation is trivial. Aortic Valve: The aortic valve is normal in structure. Aortic valve regurgitation is not visualized. No aortic stenosis is present. Pulmonic Valve: The pulmonic valve was normal in structure. Pulmonic valve regurgitation is not visualized. Aorta: The aortic root and ascending aorta are structurally normal, with  no evidence of dilitation. IAS/Shunts: The atrial septum is grossly normal.  LEFT VENTRICLE PLAX 2D LVIDd:         4.50 cm     Diastology LVIDs:         2.40 cm     LV e' medial:    7.51 cm/s LV PW:         1.30 cm     LV E/e' medial:  12.5 LV IVS:        1.20 cm     LV e' lateral:   7.10 cm/s  LVOT diam:     2.00 cm     LV E/e' lateral: 13.2 LV SV:         58 LV SV Index:   32 LVOT Area:     3.14 cm  LV Volumes (MOD) LV vol d, MOD A2C: 69.4 ml LV vol d, MOD A4C: 76.1 ml LV vol s, MOD A2C: 23.4 ml LV vol s, MOD A4C: 26.8 ml LV SV MOD A2C:     46.0 ml LV SV MOD A4C:     76.1 ml LV SV MOD BP:      48.9 ml RIGHT VENTRICLE RV S prime:     14.40 cm/s TAPSE (M-mode): 1.8 cm LEFT ATRIUM             Index        RIGHT ATRIUM           Index LA diam:        3.60 cm 2.00 cm/m   RA Area:     11.30 cm LA Vol (A2C):   48.4 ml 26.85 ml/m  RA Volume:   21.90 ml  12.15 ml/m LA Vol (A4C):   41.4 ml 22.96 ml/m LA Biplane Vol: 47.3 ml 26.24 ml/m  AORTIC VALVE LVOT Vmax:   107.00 cm/s LVOT Vmean:  67.900 cm/s LVOT VTI:    0.185 m  AORTA Ao Root diam: 2.90 cm Ao Asc diam:  2.80 cm MITRAL VALVE               TRICUSPID VALVE MV Area (PHT): 3.42 cm    TR Peak grad:   17.6 mmHg MV Decel Time: 222 msec    TR Vmax:        210.00 cm/s MV E velocity: 93.50 cm/s MV A velocity: 91.30 cm/s  SHUNTS MV E/A ratio:  1.02        Systemic VTI:  0.18 m                            Systemic Diam: 2.00 cm Kristeen Miss MD Electronically signed by Kristeen Miss MD Signature Date/Time: 02/06/2022/12:41:38 PM    Final    ECHOCARDIOGRAM COMPLETE BUBBLE STUDY  Result Date: 02/09/2022    ECHOCARDIOGRAM REPORT   Patient Name:   MALLIE BARISH Date of Exam: 02/09/2022 Medical Rec #:  [id number]           Height:       64.0 in Accession #:    [id number]          Weight:       165.0 lb Date of Birth:  [birth year]            BSA:          1.803 m Patient Age:    50 years            BP:  140/76 mmHg Patient Gender: F                   HR:           66 bpm. Exam Location:  Inpatient Procedure: 2D Echo, Cardiac Doppler and Color Doppler Indications:    ELEV. TROPONIN  History:        Patient has prior history of Echocardiogram examinations, most                 recent 02/06/2022. Risk Factors:Hypertension.  Sonographer:    Festus Barren  Referring Phys: [id number] STEVEN E SOMMER IMPRESSIONS  1. Left ventricular ejection fraction, by estimation, is >75%. The left ventricle has hyperdynamic function. The left ventricle has no regional wall motion abnormalities. There is severe left ventricular hypertrophy. Left ventricular diastolic parameters are indeterminate.  2. Right ventricular systolic function is normal. The right ventricular size is normal.  3. The mitral valve is normal in structure. Trivial mitral valve regurgitation. No evidence of mitral stenosis.  4. The aortic valve is tricuspid. Aortic valve regurgitation is not visualized. No aortic stenosis is present.  5. The inferior vena cava is normal in size with greater than 50% respiratory variability, suggesting right atrial pressure of 3 mmHg. FINDINGS  Left Ventricle: Left ventricular ejection fraction, by estimation, is >75%. The left ventricle has hyperdynamic function. The left ventricle has no regional wall motion abnormalities. The left ventricular internal cavity size was normal in size. There is severe left ventricular hypertrophy. Left ventricular diastolic parameters are indeterminate. Right Ventricle: The right ventricular size is normal. Right ventricular systolic function is normal. Left Atrium: Left atrial size was normal in size. Right Atrium: Right atrial size was normal in size. Pericardium: Trivial pericardial effusion is present. Mitral Valve: The mitral valve is normal in structure. Trivial mitral valve regurgitation. No evidence of mitral valve stenosis. Tricuspid Valve: The tricuspid valve is normal in structure. Tricuspid valve regurgitation is trivial. No evidence of tricuspid stenosis. Aortic Valve: The aortic valve is tricuspid. Aortic valve regurgitation is not visualized. No aortic stenosis is present. Aortic valve mean gradient measures 6.0 mmHg. Aortic valve peak gradient measures 11.7 mmHg. Aortic valve area, by VTI measures 2.12  cm. Pulmonic Valve: The  pulmonic valve was normal in structure. Pulmonic valve regurgitation is not visualized. No evidence of pulmonic stenosis. Aorta: The aortic root is normal in size and structure. Venous: The inferior vena cava is normal in size with greater than 50% respiratory variability, suggesting right atrial pressure of 3 mmHg. IAS/Shunts: No atrial level shunt detected by color flow Doppler.  LEFT VENTRICLE PLAX 2D LVIDd:         4.60 cm     Diastology LVIDs:         2.80 cm     LV e' medial:    9.79 cm/s LV PW:         1.20 cm     LV E/e' medial:  9.6 LV IVS:        1.30 cm     LV e' lateral:   7.23 cm/s LVOT diam:     1.90 cm     LV E/e' lateral: 13.0 LV SV:         61 LV SV Index:   34 LVOT Area:     2.84 cm  LV Volumes (MOD) LV vol d, MOD A2C: 73.3 ml LV vol d, MOD A4C: 46.5 ml LV vol s, MOD A2C: 28.2 ml LV vol  s, MOD A4C: 17.0 ml LV SV MOD A2C:     45.1 ml LV SV MOD A4C:     46.5 ml LV SV MOD BP:      36.7 ml RIGHT VENTRICLE             IVC RV S prime:     19.00 cm/s  IVC diam: 2.00 cm TAPSE (M-mode): 1.7 cm LEFT ATRIUM             Index        RIGHT ATRIUM           Index LA diam:        3.20 cm 1.78 cm/m   RA Area:     10.60 cm LA Vol (A2C):   53.3 ml 29.57 ml/m  RA Volume:   21.20 ml  11.76 ml/m LA Vol (A4C):   37.0 ml 20.52 ml/m LA Biplane Vol: 46.0 ml 25.52 ml/m  AORTIC VALVE                     PULMONIC VALVE AV Area (Vmax):    2.09 cm      PV Vmax:       0.87 m/s AV Area (Vmean):   2.07 cm      PV Vmean:      69.600 cm/s AV Area (VTI):     2.12 cm      PV VTI:        0.211 m AV Vmax:           171.00 cm/s   PV Peak grad:  3.0 mmHg AV Vmean:          109.000 cm/s  PV Mean grad:  2.0 mmHg AV VTI:            0.287 m AV Peak Grad:      11.7 mmHg AV Mean Grad:      6.0 mmHg LVOT Vmax:         126.00 cm/s LVOT Vmean:        79.400 cm/s LVOT VTI:          0.215 m LVOT/AV VTI ratio: 0.75  AORTA Ao Root diam: 2.60 cm Ao Asc diam:  2.60 cm MITRAL VALVE                TRICUSPID VALVE MV Area (PHT): 2.68 cm     TR  Peak grad:   27.2 mmHg MV Decel Time: 283 msec     TR Mean grad:   20.0 mmHg MV E velocity: 94.30 cm/s   TR Vmax:        261.00 cm/s MV A velocity: 122.00 cm/s  TR Vmean:       214.0 cm/s MV E/A ratio:  0.77                             SHUNTS                             Systemic VTI:  0.22 m                             Systemic Diam: 1.90 cm Olga Millers MD Electronically signed by Olga Millers MD Signature Date/Time: 02/09/2022/12:32:57 PM    Final    CT HEAD CODE STROKE WO CONTRAST  Result Date:  02/05/2022 CLINICAL DATA:  Code stroke. 51 year old female code stroke presentation. Hypertensive, 250 systolic. EXAM: CT HEAD WITHOUT CONTRAST TECHNIQUE: Contiguous axial images were obtained from the base of the skull through the vertex without intravenous contrast. RADIATION DOSE REDUCTION: This exam was performed according to the departmental dose-optimization program which includes automated exposure control, adjustment of the mA and/or kV according to patient size and/or use of iterative reconstruction technique. COMPARISON:  Head CT 10/26/2019. FINDINGS: Brain: Hyperdense intra-axial hemorrhage centered at the right lentiform nuclei tracks into the right temporal lobe (sagittal image 11). The blood centered at the right lentiform encompasses 46 x 28 x 51 mm (AP by transverse by CC), 4 intra-axial blood volume of slightly greater than 33 mL. Mild regional edema at this time, but associated regional mass effect including leftward midline shift of 4-5 mm. Mass effect on the right lateral ventricle. No intraventricular hemorrhage. No ventriculomegaly. Patent basilar cisterns. Partially empty sella appearance. Elsewhere gray-white matter differentiation is stable and within normal limits. Vascular: No suspicious intracranial vascular hyperdensity. Skull: No acute osseous abnormality identified. Chronically abnormal central skull base as below. Sinuses/Orbits: Chronic abnormal factory fossa with low-density  material expanding the olfactory grooves. Difficult to exclude chronic meningocele, and similar appearance of bulging Meckel's cave/cavernous sinuses into the posterior sphenoid sinuses. But otherwise Visualized paranasal sinuses and mastoids are clear. Other: Visualized orbits and scalp soft tissues are within normal limits. ASPECTS [facility] Stroke Program Early CT Score) Total score (0-10 with 10 being normal): Not applicable. IMPRESSION: 1. Positive for Acute Right Lentiform Hemorrhage tracking into the right temporal lobe. Intra-axial blood volume slightly greater than 33 mL. 2. Associated intracranial mass effect with 4-5 mm of leftward midline shift, partially effaced right lateral ventricle. No IVH or ventriculomegaly. 3. 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 4. Study discussed by telephone with Dr. Selina Cooley at 0800 hours on 02/05/2022. Electronically Signed   By: Odessa Fleming M.D.   On: 02/05/2022 08:07   VAS [place] LOWER EXTREMITY VENOUS (DVT)  Result Date: 02/23/2022  Lower Venous DVT Study Patient Name:  DEYANA BABINEAUX  Date of Exam:   02/23/2022 Medical Rec #: [id number]            Accession #:    [id number] Date of Birth: 04/18/1971             Patient Gender: F Patient Age:   57 years Exam Location:  [facility] Procedure:      VAS [place] LOWER EXTREMITY VENOUS (DVT) Referring Phys: Scheryl Marten XU --------------------------------------------------------------------------------  Indications: Pulmonary embolism.  Comparison Study: No previous exams Performing Technologist: Sherren Kerns RVS  Examination Guidelines: A complete evaluation includes B-mode imaging, spectral Doppler, color Doppler, and power Doppler as needed of all accessible portions of each vessel. Bilateral testing is considered an integral part of a complete examination. Limited examinations for reoccurring  indications may be performed as noted. The reflux portion of the exam is performed with the patient in reverse Trendelenburg.  +---------+---------------+---------+-----------+----------+--------------+ RIGHT    CompressibilityPhasicitySpontaneityPropertiesThrombus Aging +---------+---------------+---------+-----------+----------+--------------+ CFV      Full           Yes      Yes                                 +---------+---------------+---------+-----------+----------+--------------+ SFJ      Full                                                        +---------+---------------+---------+-----------+----------+--------------+  FV Prox  Full                                                        +---------+---------------+---------+-----------+----------+--------------+ FV Mid   Full                                                        +---------+---------------+---------+-----------+----------+--------------+ FV DistalFull                                                        +---------+---------------+---------+-----------+----------+--------------+ PFV      Full                                                        +---------+---------------+---------+-----------+----------+--------------+ POP      Full                                                        +---------+---------------+---------+-----------+----------+--------------+ PTV      Full                                                        +---------+---------------+---------+-----------+----------+--------------+ PERO     Full                                                        +---------+---------------+---------+-----------+----------+--------------+   +---------+---------------+---------+-----------+----------+--------------+ LEFT     CompressibilityPhasicitySpontaneityPropertiesThrombus Aging  +---------+---------------+---------+-----------+----------+--------------+ CFV      Full           Yes      Yes                                 +---------+---------------+---------+-----------+----------+--------------+ SFJ      Full                                                        +---------+---------------+---------+-----------+----------+--------------+ FV Prox  Full                                                        +---------+---------------+---------+-----------+----------+--------------+  FV Mid   Full                                                        +---------+---------------+---------+-----------+----------+--------------+ FV DistalFull                                                        +---------+---------------+---------+-----------+----------+--------------+ PFV      Full                                                        +---------+---------------+---------+-----------+----------+--------------+ POP      Full                                                        +---------+---------------+---------+-----------+----------+--------------+ PTV      Full                                                        +---------+---------------+---------+-----------+----------+--------------+ PERO     Full                                                        +---------+---------------+---------+-----------+----------+--------------+     Summary: BILATERAL: - No evidence of deep vein thrombosis seen in the lower extremities, bilaterally. - No evidence of superficial venous thrombosis in the lower extremities, bilaterally. -No evidence of popliteal cyst, bilaterally.   *See table(s) above for measurements and observations. Electronically signed by Heath Lark on 02/23/2022 at 8:55:10 PM.    Final    [place] EKG SITE RITE  Result Date: 02/09/2022 If [facility] image not attached, placement could not be confirmed due to current  cardiac rhythm.  CT ANGIO HEAD NECK W WO CM (CODE STROKE)  Result Date: 02/05/2022 CLINICAL DATA:  51 year old female code stroke presentation with acute right lentiform hemorrhage. EXAM: CT ANGIOGRAPHY HEAD AND NECK TECHNIQUE: Multidetector CT imaging of the head and neck was performed using the standard protocol during bolus administration of intravenous contrast. Multiplanar CT image reconstructions and MIPs were obtained to evaluate the vascular anatomy. Carotid stenosis measurements (when applicable) are obtained utilizing NASCET criteria, using the distal internal carotid diameter as the denominator. RADIATION DOSE REDUCTION: This exam was performed according to the departmental dose-optimization program which includes automated exposure control, adjustment of the mA and/or kV according to patient size and/or use of iterative reconstruction technique. CONTRAST:  75mL OMNIPAQUE IOHEXOL 350 MG/ML SOLN COMPARISON:  Head CT without contrast 0747 hours today. FINDINGS: CTA NECK Skeleton: Carious  right posterior maxillary dentition with periapical lucency. Abnormal central skull base, see plain head CT today. No acute osseous abnormality identified. Upper chest: Negative. Other neck: Negative. Aortic arch: 3 vessel arch configuration. No arch or proximal great vessel atherosclerosis. Right carotid system: Negative aside from partially retropharyngeal course and mild tortuosity. Left carotid system: Negative aside from mild tortuosity. Vertebral arteries: Negative. The right vertebral artery appears slightly dominant. CTA HEAD Posterior circulation: Fairly codominant distal vertebral arteries with no plaque or stenosis. Patent vertebrobasilar junction without stenosis. Normal PICA origins. Patent basilar artery, AICA origins, SCA origins. Fetal type bilateral PCA origins. Mildly tortuous right posterior communicating artery. 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 Contralateral right PCA P2 segment is also mildly irregular and stenotic, but the right PCA branches remain patent. Anterior circulation: Both ICA siphons are patent without plaque or stenosis. Normal ophthalmic and posterior communicating artery origins. Patent carotid termini, MCA and ACA origins are within normal limits. Left A1 appears mildly dominant. Diminutive or absent anterior communicating artery. Bilateral ACA branches are within normal limits. Left MCA M1 segment and trifurcation are patent without stenosis. Left MCA branches are within normal limits. Right MCA M1 segment and bifurcation are patent without stenosis. Right MCA branches appear normal aside from mild intra-axial hemorrhage associated mass effect. No CTA spot sign. No abnormal vascularity identified in association with the acute hemorrhage. Venous sinuses: Early contrast timing, not well evaluated but the superior sagittal sinus appears to be patent. Anatomic variants: Mildly dominant left vertebral artery. Fetal type bilateral PCA origins. Review of the MIP images confirms the above findings IMPRESSION: 1. Negative for large vessel occlusion, CTA spot sign, intracranial aneurysm, or abnormal vasculature in association with the acute right lentiform hemorrhage. 2. 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 No other large vessel stenosis, and the anterior circulation is essentially negative. Underlying fetal type PCA origins. 3. No superimposed atherosclerosis or stenosis in the neck. 4. Carious right maxillary dentition. Salient findings discussed by telephone with Dr. Bing Neighbors on 02/05/2022 at 08:14 . Electronically Signed   By: Odessa Fleming M.D.   On: 02/05/2022 08:22    Labs:  CBC: Recent Labs     02/25/22 0121 02/26/22 0026 03/02/22 0407 03/03/22 0533  WBC 5.9 6.5 7.9 6.8  HGB 11.1* 10.3* 11.4* 11.9*  HCT 34.9* 32.3* 35.3* 35.8*  PLT 208 209 247 269    COAGS: Recent Labs    02/05/22 0915 02/14/22 1101 03/01/22 1802 03/02/22 0407 03/03/22 0533  INR 1.2 1.1  --   --   --   APTT 30 34 85* 105* 74*    BMP: Recent Labs    02/25/22 0121 02/26/22 0026 02/26/22 1218 02/27/22 0232 02/28/22 0306  NA 135 131*  --  133* 132*  K 4.5 4.9  --  4.6 4.6  CL 104 101  --  102 101  CO2 24 23  --  23 24  GLUCOSE 157* 180*  --  123* 141*  BUN 33* 36*  --  34* 39*  CALCIUM 11.1* 11.0* 11.4* 11.5* 11.1*  CREATININE 0.93 0.98  --  0.94 0.98  GFRNONAA >60 >60  --  >60 >60    LIVER FUNCTION TESTS: Recent Labs    02/05/22 0915 02/07/22  0320 02/19/22 0539  BILITOT 0.7 0.4 0.3  AST 33 31 34  ALT 26 31 54*  ALKPHOS 65 48 54  PROT 7.5 6.3* 6.8  ALBUMIN 3.9 3.1* 2.6*    TUMOR MARKERS: No results for input(s): AFPTM, CEA, CA199, CHROMGRNA in the last 8760 hours.  Assessment and Plan: History of HTN. Pt presented to ED 02/05/22 c/o L sided weakness and slurred speech. 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 Due to pt inability to take anything by mouth, pt referred to IR for gastrostomy tube placement by Dr. Corrie Mckusick. Pt currently has Dobbhoff in place for nutrition. Procedure is tentatively scheduled for 03/06/22.   Pt resting in bed. She appears agitated. Pt does not respond to voice/touch.  She does not follow commands.  Tentatively scheduled for 03/06/22.  Order placed to stop tube feeds at MN prior to procedure. No contrast needed prior to procedure.   Risks and benefits image guided gastrostomy tube placement was discussed with the patient's daughter including, but not limited to the need for a barium enema during the procedure, bleeding, infection, peritonitis and/or damage to adjacent structures.  Pt daughter was called to obtain consent.  All of the  patient's daughter's questions were answered, and she is agreeable to proceed.  Consent signed and in chart.   Thank you for this interesting consult.  I greatly enjoyed meeting REMEDY MCKNEELY and look forward to participating in their care.  A copy of this report was sent to the requesting provider on this date.  Electronically Signed: Shon Hough, NP 03/03/2022, 1:03 PM   I spent a total of 20 minutes in face to face in clinical consultation, greater than 50% of which was counseling/coordinating care for gastrostomy tube placement.

## 2022-03-03 NOTE — Progress Notes (Addendum)
Speech Language Pathology Treatment: Dysphagia;Cognitive-Linquistic  ?Patient Details ?Name: Sarah Myers ?MRN: [id number] ?DOB: [birth year] ?Today's Date: 03/03/2022 ?Time: 2446-2863 ?SLP Time Calculation (min) (ACUTE ONLY): 18 min ? ?Assessment / Plan / Recommendation ?Clinical Impression ? Pt aroused easily this am with verbal and tactile stimuli with adequate alertness. She only tolerated valve for intervals up to 90 sec with significant upper airway wheeze. She was able to divert air to upper airway however possible upper airway irritation or impedance of full exhalation (?) resulting in wheeze. Slight back pressure when valve removed. Mostly vocalizations and no attempts at decipherable words. She responds with gestures (head nods/shaked, thumbs up) but therapist has not been successful in shaping into sounds as of yet.  ? ?Attempted applesauce and thickened apple juice with hand over hand presentation resulting in pt shaking her head and pursing lips. At end of study when asked if she wants something to eat or drink she shakes her head. She would not accept po despite cues. Longer term nutrition recommended and St will continue to work on swallow and speech  ?  ?HPI HPI: 51 y/o female came [facility] the [facility] ER on 2/19 with left sided weakness and slurred speech.  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 Intubated 2/19. Trach changed from #6 cuffed to #4 cuffless on 3/16. Palliative NP met with patient's three daughters and decision was reached that they would like to see how PO trials go and if not tolerated they are open to PEG; SNF at discharge is anticipated by all parties. ?  ?   ?SLP Plan ? Continue with current plan of care ? ?  ?  ?Recommendations for follow up therapy are one component of a multi-disciplinary discharge planning process, led by the attending physician.  Recommendations may be updated based on patient status, additional functional criteria and insurance  authorization. ?  ? ?Recommendations  ?Diet recommendations: NPO ?Medication Administration: Via alternative means  ?   ? Patient may use Passy-Muir Speech Valve: with SLP only ?PMSV Supervision: Full  ?   ? ? ? ? Oral Care Recommendations: Oral care QID ?Follow Up Recommendations: Skilled nursing-short term rehab (<3 hours/day) ?Assistance recommended at discharge: Frequent or constant Supervision/Assistance ?SLP Visit Diagnosis: Dysphagia, unspecified (R13.10);Aphasia (R47.01);Cognitive communication deficit (R41.841) ?Plan: Continue with current plan of care ? ? ? ? ?  ?  ? ? ?[name] ? ?03/03/2022, 9:44 AM ?

## 2022-03-03 NOTE — Progress Notes (Signed)
ANTICOAGULATION CONSULT NOTE - Follow Up Consult ? ?Pharmacy Consult for Apixaban to Heparin ?Indication: pulmonary embolus / CVA ? ?Allergies  ?Allergen Reactions  ? Lisinopril Cough  ? ? ?Patient Measurements: ?Height: 5\' 4"  (162.6 cm) ?Weight: 72.6 kg (160 lb 0.9 oz) ?IBW/kg (Calculated) : 54.7 ?Heparin Dosing Weight: 70 kg ? ?Vital Signs: ?Temp: 98.2 ?F (36.8 ?C) (03/17 0358) ?Temp Source: Oral (03/17 0358) ?BP: 120/91 (03/17 0358) ?Pulse Rate: 72 (03/17 0435) ? ?Labs: ?Recent Labs  ?  03/01/22 ?1802 03/02/22 ?0407 03/03/22 ?0533  ?HGB  --  11.4* 11.9*  ?HCT  --  35.3* 35.8*  ?PLT  --  247 269  ?APTT 85* 105* 74*  ?HEPARINUNFRC  --  >1.10* 0.99*  ? ? ? ?Estimated Creatinine Clearance: 67.9 mL/min (by C-G formula based on SCr of 0.98 mg/dL). ? ?Assessment: ?55 YOF with bilateral PE on CTA without RHS. Of note, patient was initially admitted 2/19 with ICH - now stable on follow-up imaging. Patient was started on apixaban on 3/11. Apixaban held 3/15 for PEG placement. Last dose of apixaban 02/28/21 PM. Pharmacy consulted for heparin.   ? ?PTT therapeutic this AM at 74 seconds ? ? ?Goal of Therapy:  ?Heparin level 0.3-0.5 units/ml ?aPTT 66 to 85 seconds for CVA ?Monitor platelets by anticoagulation protocol: Yes ? ?  ?Plan:  ?Continue heparin at 1100 units / hr ?Daily heparin level, CBC, aptt ? ?Follow up plans for PEG tube ? ? ?Thank you ?Anette Guarneri, PharmD ?Please check AMION for all [facility] [place] phone numbers ?After 10:00 PM, call [place] [phone] ? ?

## 2022-03-03 NOTE — Progress Notes (Signed)
Patient seen today by trach team for consult. No education needed at this time.  All the necessary equipment is at bedside.  Will continue to follow for progression. ?

## 2022-03-03 NOTE — Progress Notes (Signed)
Contacted MD on call regarding patient's tongue being coated very thick and white at 2223. MD responded at 2233 stating, "Sorry, I am very busy at the moment, have several very sick patients. Daytime MD can evaluate the patient and order medication if they think it is oral thrush based on physical exam." ? ?Will leave note for MD on for day shift and pass on to the receiving day nurse.  ? ?Thorough oral care performed every 2 hours as patient is a tracheostomy patient. Will continue to monitor for symptoms of infection, distress, and complication.   ?

## 2022-03-03 NOTE — Progress Notes (Signed)
?PROGRESS NOTE ? ? ? ?Sarah Myers  OMV:[id number] DOB: 10-Feb-1971 DOA: 02/05/2022 ?PCP: [name], MD  ? ? ?Brief Narrative:  ?51 year old with no previous known history presented to the emergency room with left-sided weakness and slurred speech.  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 ?2/19, admitted to intensive care unit with left-sided dense hemiplegia and slurred speech and found to have right intracranial hemorrhage with brain compression present on admission.  Treated with 3% saline, Cleviprex and intubated.  She failed intubation x2 and underwent tracheostomy on 2/28 by PCCM. ?2/28, tracheostomy. ?3/1 trach collar, off ventilator on 3/4. ?Transferred to medical floor on 3/6. ?Hospital course complicated by MSSA pneumonia, completed antibiotic therapy.  Waiting for PEG tube placement. ? ?Assessment & Plan: ?  ?Acute right basal ganglia hemorrhage, hypertensive emergency: ?CT head on arrival, acute right lentiform hemorrhage with mass effect 5 mm left shift ?CT head 2/26 stable hematoma ?Echocardiogram, essentially normal.  Normal ejection fraction. ?CT head 3/8, expected regression of right ICH. ?CT head 3/12 continued expected interval evolution of intraparenchymal hematoma ?Followed by critical care and neurology and currently signed off. ?Patient is on multiple oral medications including Norvasc 10 mg, clonidine 0.1 mg 3 times daily, Coreg 25 mg twice daily, spironolactone 25 mg daily, Cozaar 100 mg daily.  Blood pressure stable. ?Klonopin and Seroquel for agitation and discomfort. ? ?Acute respiratory insufficiency secondary to massive intracranial hemorrhage: ?2/28, tracheostomy ?3/1, trach collar. ?3/16, downsized to cuffless #6 trach. ? 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  Patient has Dobbhoff tube in place.  Speech therapy continues to follow.  Even though she is waking up, she declines to put food in the mouth or start swallowing.  Will not have enough nutrition and calorie intake  with oral trial. ?IR consulted for PEG tube placement. ? ?Bilateral pulmonary embolism: Detected on CT angiogram 3/7.  Patient was initially on heparin, then transitioned to Eliquis.  Keep on heparin periprocedure. ?Cleared for anticoagulation by neurology on 3/8, Dr. Roda Shutters. ? ?Hypercalcemia: Probably secondary to dehydration/calcium mobilization from immobility.  PTH is normal.  Not on supplements.  Will monitor. ? ?Goal of care: Critically sick.  High risk of rehemorrhage on anticoagulation.  Palliative care, family meeting 3/16. ?Full scope of treatment. ?Trach PEG and nursing home placement. ? ? ?DVT prophylaxis: SCD's Start: 02/05/22 0846.  Heparin infusion. ? ? ?Code Status: Full code ?Family Communication: None today. ?Disposition Plan: Status is: Inpatient ?Remains inpatient appropriate because: Unsafe disposition plan.  No oral intake. ?  ? ? ?Consultants:  ?Neurology ?Neurosurgery ?Critical care ? ?Procedures:  ?Tracheostomy 2/28 ? ?Antimicrobials:  ?Cefazolin 3/8--- 3/16. ? ? ?Subjective: ? ?Patient seen and examined.  No overnight events.  Speech therapy was trying to work with her.  She was more awake but she was trying to push away with her right hand.  She would not open her mouth or initiate eating.  She will not swallow anything that is kept in her mouth.  Looks comfortable today. ? ?Objective: ?Vitals:  ? 03/03/22 0419 03/03/22 0435 03/03/22 0825 03/03/22 0850  ?BP:   133/68 115/68  ?Pulse:  72 69 66  ?Resp:  18 17 16   ?Temp:   (!) 97.5 ?F (36.4 ?C)   ?TempSrc:   Axillary   ?SpO2:  100% 100% 100%  ?Weight: 72.6 kg     ?Height:      ? ? ?Intake/Output Summary (Last 24 hours) at 03/03/2022 1039 ?Last data filed at 03/03/2022 0400 ?Gross per 24 hour  ?  Intake 1523.07 ml  ?Output 800 ml  ?Net 723.07 ml  ? ? ?Filed Weights  ? 03/01/22 0418 03/02/22 0500 03/03/22 0419  ?Weight: 71.9 kg 79.8 kg 72.6 kg  ? ? ?Examination: ? ?General exam: Chronically sick looking, frail and debilitated. ?Following commands and  moving right side.  Pushing away and resisting from the right side. ?Left hemineglect.  Dense hemiplegia left. ?Respiratory system: Clear to auscultation. Respiratory effort normal.  Conducted upper airway sounds.  Mostly on room air. ?Cardiovascular system: S1 & S2 heard, RRR.  ?Gastrointestinal system: Soft.  Nontender.  Bowel sound present. ? ? ? ?Data Reviewed: I have personally reviewed following labs and imaging studies ? ?CBC: ?Recent Labs  ?Lab 02/25/22 ?0121 02/26/22 ?0026 03/02/22 ?0407 03/03/22 ?0533  ?WBC 5.9 6.5 7.9 6.8  ?HGB 11.1* 10.3* 11.4* 11.9*  ?HCT 34.9* 32.3* 35.3* 35.8*  ?MCV 88.8 89.5 89.4 88.4  ?PLT 208 209 247 269  ? ? ?Basic Metabolic Panel: ?Recent Labs  ?Lab 02/25/22 ?0121 02/26/22 ?0026 02/26/22 ?1218 02/27/22 ?6314 02/28/22 ?9702  ?NA 135 131*  --  133* 132*  ?K 4.5 4.9  --  4.6 4.6  ?CL 104 101  --  102 101  ?CO2 24 23  --  23 24  ?GLUCOSE 157* 180*  --  123* 141*  ?BUN 33* 36*  --  34* 39*  ?CREATININE 0.93 0.98  --  0.94 0.98  ?CALCIUM 11.1* 11.0* 11.4* 11.5* 11.1*  ? ? ?GFR: ?Estimated Creatinine Clearance: 67.9 mL/min (by C-G formula based on SCr of 0.98 mg/dL). ?Liver Function Tests: ?No results for input(s): AST, ALT, ALKPHOS, BILITOT, PROT, ALBUMIN in the last 168 hours. ?No results for input(s): LIPASE, AMYLASE in the last 168 hours. ?No results for input(s): AMMONIA in the last 168 hours. ?Coagulation Profile: ?No results for input(s): INR, PROTIME in the last 168 hours. ?Cardiac Enzymes: ?No results for input(s): CKTOTAL, CKMB, CKMBINDEX, TROPONINI in the last 168 hours. ?BNP (last 3 results) ?No results for input(s): PROBNP in the last 8760 hours. ?HbA1C: ?No results for input(s): HGBA1C in the last 72 hours. ?CBG: ?Recent Labs  ?Lab 03/02/22 ?1728 03/02/22 ?2046 03/02/22 ?2337 03/03/22 ?0401 03/03/22 ?6378  ?GLUCAP 125* 143* 112* 128* 138*  ? ? ?Lipid Profile: ?No results for input(s): CHOL, HDL, LDLCALC, TRIG, CHOLHDL, LDLDIRECT in the last 72 hours. ?Thyroid Function  Tests: ?No results for input(s): TSH, T4TOTAL, FREET4, T3FREE, THYROIDAB in the last 72 hours. ?Anemia Panel: ?No results for input(s): VITAMINB12, FOLATE, FERRITIN, TIBC, IRON, RETICCTPCT in the last 72 hours. ?Sepsis Labs: ?No results for input(s): PROCALCITON, LATICACIDVEN in the last 168 hours. ? ?Recent Results (from the past 240 hour(s))  ?Urine Culture     Status: None  ? Collection Time: 02/26/22  7:02 AM  ? Specimen: Urine, Catheterized  ?Result Value Ref Range Status  ? Specimen Description URINE, CATHETERIZED  Final  ? Special Requests NONE  Final  ? Culture   Final  ?  NO GROWTH ?Performed at [facility], [address] [place], [place] [address] ?  ? Report Status 02/27/2022 FINAL  Final  ? ?  ? ? ? ? ? ?Radiology Studies: ?CT ABDOMEN WO CONTRAST ? ?Result Date: 03/02/2022 ?CLINICAL DATA:  Peg placement screening. EXAM: CT ABDOMEN WITHOUT CONTRAST TECHNIQUE: Multidetector CT imaging of the abdomen was performed following the standard protocol without IV contrast. RADIATION DOSE REDUCTION: This exam was performed according to the departmental dose-optimization program which includes automated exposure control, adjustment of  the mA and/or kV according to patient size and/or use of iterative reconstruction technique. COMPARISON:  CT February 21, 2022 and February 10, 2012 FINDINGS: Lower chest: Trace bilateral pleural effusions with adjacent airspace consolidations. Cardiomegaly. Hepatobiliary: Unremarkable noncontrast appearance of the hepatic parenchyma. Cholelithiasis without findings of acute cholecystitis. No biliary ductal dilation. Pancreas: No pancreatic ductal dilation or evidence of acute inflammation. Spleen: No splenomegaly or focal splenic lesion. Adrenals/Urinary Tract: Bilateral adrenal glands appear normal. No hydronephrosis. No nephrolithiasis. Stomach/Bowel: No enteric contrast was administered. Weighted enteric feeding catheter with tip in the proximal stomach near or at the GE  junction. There are no interposed loops of bowel anterior to the lower gastric fundus/antrum. No pathologic dilation or evidence of acute inflammation involving loops of large or small bowel in the abdomen. Moderat

## 2022-03-04 ENCOUNTER — Other Ambulatory Visit: Payer: Self-pay

## 2022-03-04 ENCOUNTER — Encounter (HOSPITAL_COMMUNITY): Payer: Self-pay | Admitting: Neurology

## 2022-03-04 LAB — CBC
HCT: 34.4 % — ABNORMAL LOW (ref 36.0–46.0)
Hemoglobin: 11.1 g/dL — ABNORMAL LOW (ref 12.0–15.0)
MCH: 29.1 pg (ref 26.0–34.0)
MCHC: 32.3 g/dL (ref 30.0–36.0)
MCV: 90.1 fL (ref 80.0–100.0)
Platelets: 272 10*3/uL (ref 150–400)
RBC: 3.82 MIL/uL — ABNORMAL LOW (ref 3.87–5.11)
RDW: 14.1 % (ref 11.5–15.5)
WBC: 7.2 10*3/uL (ref 4.0–10.5)
nRBC: 0 % (ref 0.0–0.2)

## 2022-03-04 LAB — APTT: aPTT: 69 seconds — ABNORMAL HIGH (ref 24–36)

## 2022-03-04 LAB — GLUCOSE, CAPILLARY
Glucose-Capillary: 103 mg/dL — ABNORMAL HIGH (ref 70–99)
Glucose-Capillary: 113 mg/dL — ABNORMAL HIGH (ref 70–99)
Glucose-Capillary: 120 mg/dL — ABNORMAL HIGH (ref 70–99)
Glucose-Capillary: 134 mg/dL — ABNORMAL HIGH (ref 70–99)
Glucose-Capillary: 141 mg/dL — ABNORMAL HIGH (ref 70–99)
Glucose-Capillary: 144 mg/dL — ABNORMAL HIGH (ref 70–99)

## 2022-03-04 LAB — HEPARIN LEVEL (UNFRACTIONATED): Heparin Unfractionated: 0.75 IU/mL — ABNORMAL HIGH (ref 0.30–0.70)

## 2022-03-04 NOTE — Progress Notes (Signed)
ANTICOAGULATION CONSULT NOTE - Follow Up Consult ? ?Pharmacy Consult for Heparin ?Indication: pulmonary embolus and CVA ? ?Allergies  ?Allergen Reactions  ? Lisinopril Cough  ? ?Patient Measurements: ?Height: 5\' 4"  (162.6 cm) ?Weight: 74.3 kg (163 lb 12.8 oz) ?IBW/kg (Calculated) : 54.7 kg ?Heparin Dosing Weight: 70 kg ? ?Vital Signs: ?Temp: 98 ?F (36.7 ?C) (03/18 09-20-1974) ?Temp Source: Axillary (03/18 0318) ?BP: 100/65 (03/18 0348) ?Pulse Rate: 61 (03/18 0445) ? ?Labs: ?Recent Labs  ?  03/02/22 ?0407 03/03/22 ?0533 03/04/22 ?0111  ?HGB 11.4* 11.9* 11.1*  ?HCT 35.3* 35.8* 34.4*  ?PLT 247 269 272  ?APTT 105* 74* 69*  ?HEPARINUNFRC >1.10* 0.99* 0.75*  ? ? ?Estimated Creatinine Clearance: 68.5 mL/min (by C-G formula based on SCr of 0.98 mg/dL). ? ? ?Assessment: ?51 yo female presents with right ICH with brain compression and found to have bilateral pulmonary embolism without RHS. PTA the patient is not on anticoagulation. The patient was started on apixaban on 3/11, but was transitioned to heparin on 3/15 while awaiting PEG placement. Last apixaban dose on 3/14 @ 2126, therefore, will monitor heparin therapy with aPTT until it starts to correlate with the heparin level. Pharmacy is consulted to dose heparin.  ?  ?aPTT is therapeutic at 69 while on heparin at 1100 units/hr. There have been no signs or symptoms of bleeding documented, and no issues with the infusion. Hgb 11.1, platelets 272. The heparin infusion will be stopped prior to IR procedure on 3/20 (stop date in), and can be restarted 4-6 hours post-procedure if there is no bleeding/oozing from the site. ? ?Goal of Therapy:  ?Heparin level 0.3-0.5 units/ml ?aPTT 66-85 seconds ?Monitor platelets by anticoagulation protocol: Yes ?  ?Plan:  ?Continue heparin IV at 1100 units/hr ?Monitor for signs and symptoms of bleeding ?Obtain a daily aPTT, heparin level, and CBC ?Follow gastrostomy tube placement plans ?Resume heparin post-procedure ? ?4/20, PharmD, RPh   ?PGY-2 Pharmacy Resident ?03/04/2022 7:23 AM ? ?Please check [email or web address] for unit-specific pharmacy phone numbers. ? ? ? ?

## 2022-03-04 NOTE — Progress Notes (Signed)
?PROGRESS NOTE ? ? ? ?BETUL BRISKY  BTD:[id number] DOB: 07/14/1971 DOA: 02/05/2022 ?PCP: [name], MD  ? ? ?Brief Narrative:  ?51 year old with history of hypertension presented to the emergency room with left-sided weakness and slurred speech.  She was found to have dense left hemiplegia and intracranial hemorrhage. ?2/19, admitted to intensive care unit with left-sided dense hemiplegia and slurred speech and found to have right intracranial hemorrhage with brain compression present on admission.  Treated with 3% saline, Cleviprex and intubated.  She failed intubation x2 and underwent tracheostomy on 2/28 by PCCM. ?2/28, tracheostomy. ?3/1 trach collar, off ventilator on 3/4. ?Transferred to medical floor on 3/6. ?Hospital course complicated by MSSA pneumonia, completed antibiotic therapy.  Waiting for PEG tube placement. ? ?Assessment & Plan: ?  ?Acute right basal ganglia hemorrhage, hypertensive emergency: ?CT head on arrival, acute right lentiform hemorrhage with mass effect 5 mm left shift ?CT head 2/26 stable hematoma ?Echocardiogram, essentially normal.  Normal ejection fraction. ?CT head 3/8, expected regression of right ICH. ?CT head 3/12 continued expected interval evolution of intraparenchymal hematoma ?Followed by critical care and neurology and currently signed off. ?Patient is on multiple oral medications including Norvasc 10 mg, clonidine 0.1 mg 3 times daily, Coreg 25 mg twice daily, spironolactone 25 mg daily, Cozaar 100 mg daily.  Blood pressure acceptable. ?Klonopin and Seroquel for agitation and discomfort.  Adequate pain medications. ? ?Acute respiratory insufficiency secondary to massive intracranial hemorrhage: ?2/28, tracheostomy ?3/1, trach collar. ?3/16, downsized to cuffless #6 trach.  Currently on room air. ? 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  Patient has Dobbhoff tube in place.  Speech therapy continues to follow.  Even though she is waking up, she declines to put food  in the mouth or start swallowing.  Will not have enough nutrition and calorie intake with oral trial. ?Scheduled for PEG tube placement on 3/20. ? ?Bilateral pulmonary embolism: Detected on CT angiogram 3/7.  Patient was initially on heparin, then transitioned to Eliquis.  Keep on heparin periprocedure. ?Cleared for anticoagulation by neurology on 3/8, Dr. Erlinda Hong. ? ?Hypercalcemia: Probably secondary to dehydration/calcium mobilization from immobility.  PTH is normal.  Not on supplements.  Will monitor. ? ?Goal of care: Critically sick.  High risk of rehemorrhage on anticoagulation.  Palliative care, family meeting 3/16. ?Full scope of treatment. ?Trach PEG and nursing home placement. ? ? ?DVT prophylaxis: SCD's Start: 02/05/22 0846.  Heparin infusion. ? ? ?Code Status: Full code ?Family Communication: Daughter [name] on the phone. ?Disposition Plan: Status is: Inpatient ?Remains inpatient appropriate because: Unsafe disposition plan.  No oral intake. ?  ? ? ?Consultants:  ?Neurology ?Neurosurgery ?Critical care ?Palliative care ? ?Procedures:  ?Tracheostomy 2/28 ? ?Antimicrobials:  ?Cefazolin 3/8--- 3/16. ? ? ?Subjective: ? ?Patient seen and examined.  No change in clinical status.  Remains intermittently agitated and tries to use right hand, mostly unpurposeful.  Risk of pulling on tubes and tracheostomy, soft mittens in place. ? ?Objective: ?Vitals:  ? 03/04/22 0348 03/04/22 0445 03/04/22 0749 03/04/22 0855  ?BP: 100/65  114/69 114/69  ?Pulse: 60 61 65 67  ?Resp: _0 ?Temp:   98.2 ?F (36.8 ?C)   ?TempSrc:   Axillary   ?SpO2: 100% 99%  99%  ?Weight:      ?Height:      ? ? ?Intake/Output Summary (Last 24 hours) at 03/04/2022 1126 ?Last data filed at 03/04/2022 0700 ?Gross per 24 hour  ?Intake 3204.78 ml  ?Output 1650 ml  ?Net  1554.78 ml  ? ?Filed Weights  ? 03/02/22 0500 03/03/22 0419 03/04/22 0318  ?Weight: 79.8 kg 72.6 kg 74.3 kg  ? ? ?Examination: ? ?General exam: Chronically sick looking, frail and  debilitated. ?Following commands and moving right side.  Pushing away and resisting from the right side. ?Left hemineglect.  Dense hemiplegia left. ?Respiratory system: Clear to auscultation. Respiratory effort normal.  Conducted upper airway sounds.  Mostly on room air.  Trach site clean and dry. ?Cardiovascular system: S1 & S2 heard, RRR.  ?Gastrointestinal system: Soft.  Nontender.  Bowel sound present. ? ? ? ?Data Reviewed: I have personally reviewed following labs and imaging studies ? ?CBC: ?Recent Labs  ?Lab 02/26/22 ?0026 03/02/22 ?0407 03/03/22 ?0533 03/04/22 ?0111  ?WBC 6.5 7.9 6.8 7.2  ?HGB 10.3* 11.4* 11.9* 11.1*  ?HCT 32.3* 35.3* 35.8* 34.4*  ?MCV 89.5 89.4 88.4 90.1  ?PLT 209 247 269 272  ? ?Basic Metabolic Panel: ?Recent Labs  ?Lab 02/26/22 ?0026 02/26/22 ?1218 02/27/22 ?0232 02/28/22 ?0306  ?NA 131*  --  133* 132*  ?K 4.9  --  4.6 4.6  ?CL 101  --  102 101  ?CO2 23  --  23 24  ?GLUCOSE 180*  --  123* 141*  ?BUN 36*  --  34* 39*  ?CREATININE 0.98  --  0.94 0.98  ?CALCIUM 11.0* 11.4* 11.5* 11.1*  ? ?GFR: ?Estimated Creatinine Clearance: 68.5 mL/min (by C-G formula based on SCr of 0.98 mg/dL). ?Liver Function Tests: ?No results for input(s): AST, ALT, ALKPHOS, BILITOT, PROT, ALBUMIN in the last 168 hours. ?No results for input(s): LIPASE, AMYLASE in the last 168 hours. ?No results for input(s): AMMONIA in the last 168 hours. ?Coagulation Profile: ?No results for input(s): INR, PROTIME in the last 168 hours. ?Cardiac Enzymes: ?No results for input(s): CKTOTAL, CKMB, CKMBINDEX, TROPONINI in the last 168 hours. ?BNP (last 3 results) ?No results for input(s): PROBNP in the last 8760 hours. ?HbA1C: ?No results for input(s): HGBA1C in the last 72 hours. ?CBG: ?Recent Labs  ?Lab 03/03/22 ?1522 03/03/22 ?1948 03/03/22 ?2314 03/04/22 ?2761 03/04/22 ?0746  ?GLUCAP 174* 138* 150* 134* 113*  ? ?Lipid Profile: ?No results for input(s): CHOL, HDL, LDLCALC, TRIG, CHOLHDL, LDLDIRECT in the last 72 hours. ?Thyroid  Function Tests: ?No results for input(s): TSH, T4TOTAL, FREET4, T3FREE, THYROIDAB in the last 72 hours. ?Anemia Panel: ?No results for input(s): VITAMINB12, FOLATE, FERRITIN, TIBC, IRON, RETICCTPCT in the last 72 hours. ?Sepsis Labs: ?No results for input(s): PROCALCITON, LATICACIDVEN in the last 168 hours. ? ?Recent Results (from the past 240 hour(s))  ?Urine Culture     Status: None  ? Collection Time: 02/26/22  7:02 AM  ? Specimen: Urine, Catheterized  ?Result Value Ref Range Status  ? Specimen Description URINE, CATHETERIZED  Final  ? Special Requests NONE  Final  ? Culture   Final  ?  NO GROWTH ?Performed at [facility], [place] [address], [place], [place] [address] ?  ? Report Status 02/27/2022 FINAL  Final  ? ?  ? ? ? ? ? ?Radiology Studies: ?No results found. ? ? ? ? ? ?Scheduled Meds: ? amLODipine  10 mg Per Tube Daily  ? carvedilol  25 mg Per Tube BID WC  ? chlorhexidine gluconate (MEDLINE KIT)  15 mL Mouth Rinse BID  ? Chlorhexidine Gluconate Cloth  6 each Topical Q0600  ? clonazePAM  1.5 mg Per Tube BID  ? feeding supplement (PROSource TF)  45 mL Per Tube BID  ?  free water  200 mL Per Tube Q8H  ? guaiFENesin  200 mg Per Tube Q6H  ? insulin aspart  0-15 Units Subcutaneous Q4H  ? losartan  100 mg Per Tube Daily  ? mouth rinse  15 mL Mouth Rinse 10 times per day  ? multivitamin with minerals  1 tablet Per Tube Daily  ? nutrition supplement (JUVEN)  1 packet Per Tube BID BM  ? pantoprazole sodium  40 mg Per Tube Daily  ? QUEtiapine  50 mg Per Tube QHS  ? spironolactone  50 mg Per Tube Daily  ? ?Continuous Infusions: ? feeding supplement (JEVITY 1.5 CAL/FIBER) 1,000 mL (03/04/22 0422)  ? heparin 1,100 Units/hr (03/04/22 0700)  ? ? ? LOS: 27 days  ? ? ?Time spent: 35 minutes ? ? ? ?Barb Merino, MD ?[facility] ?Pager [phone] ? ?

## 2022-03-05 LAB — GLUCOSE, CAPILLARY
Glucose-Capillary: 110 mg/dL — ABNORMAL HIGH (ref 70–99)
Glucose-Capillary: 115 mg/dL — ABNORMAL HIGH (ref 70–99)
Glucose-Capillary: 117 mg/dL — ABNORMAL HIGH (ref 70–99)
Glucose-Capillary: 121 mg/dL — ABNORMAL HIGH (ref 70–99)
Glucose-Capillary: 127 mg/dL — ABNORMAL HIGH (ref 70–99)
Glucose-Capillary: 92 mg/dL (ref 70–99)

## 2022-03-05 LAB — APTT
aPTT: 62 seconds — ABNORMAL HIGH (ref 24–36)
aPTT: 67 seconds — ABNORMAL HIGH (ref 24–36)

## 2022-03-05 LAB — CBC
HCT: 34.8 % — ABNORMAL LOW (ref 36.0–46.0)
Hemoglobin: 11.3 g/dL — ABNORMAL LOW (ref 12.0–15.0)
MCH: 29 pg (ref 26.0–34.0)
MCHC: 32.5 g/dL (ref 30.0–36.0)
MCV: 89.2 fL (ref 80.0–100.0)
Platelets: 271 10*3/uL (ref 150–400)
RBC: 3.9 MIL/uL (ref 3.87–5.11)
RDW: 14.5 % (ref 11.5–15.5)
WBC: 6.6 10*3/uL (ref 4.0–10.5)
nRBC: 0 % (ref 0.0–0.2)

## 2022-03-05 LAB — HEPARIN LEVEL (UNFRACTIONATED): Heparin Unfractionated: 0.76 IU/mL — ABNORMAL HIGH (ref 0.30–0.70)

## 2022-03-05 MED ORDER — OXYCODONE HCL 5 MG PO TABS
5.0000 mg | ORAL_TABLET | ORAL | Status: DC | PRN
  Administered 2022-03-05 – 2022-03-22 (×24): 5 mg
  Filled 2022-03-05 (×27): qty 1

## 2022-03-05 MED ORDER — QUETIAPINE FUMARATE 100 MG PO TABS
100.0000 mg | ORAL_TABLET | Freq: Every day | ORAL | Status: DC
Start: 2022-03-05 — End: 2022-03-08
  Administered 2022-03-05 – 2022-03-07 (×3): 100 mg
  Filled 2022-03-05 (×3): qty 1

## 2022-03-05 MED ORDER — FLUCONAZOLE 200 MG PO TABS
200.0000 mg | ORAL_TABLET | Freq: Once | ORAL | Status: AC
  Administered 2022-03-05: 200 mg via ORAL
  Filled 2022-03-05: qty 1

## 2022-03-05 NOTE — Plan of Care (Signed)
°  Problem: Education: °Goal: Knowledge of disease or condition will improve °Outcome: Not Progressing °Goal: Knowledge of secondary prevention will improve (SELECT ALL) °Outcome: Not Progressing °Goal: Knowledge of patient specific risk factors will improve (INDIVIDUALIZE FOR PATIENT) °Outcome: Not Progressing °  °

## 2022-03-05 NOTE — Progress Notes (Addendum)
ANTICOAGULATION CONSULT NOTE - Follow Up Consult ? ?Pharmacy Consult for Heparin ?Indication: pulmonary embolus and CVA ? ?Allergies  ?Allergen Reactions  ? Lisinopril Cough  ? ? ?Patient Measurements: ?Height: 5\' 4"  (162.6 cm) ?Weight: 76 kg (167 lb 8.8 oz) ?IBW/kg (Calculated) : 54.7 kg ?Heparin Dosing Weight: 70 kg ? ?Vital Signs: ?Temp: 97.8 ?F (36.6 ?C) (03/19 0408) ?Temp Source: Oral (03/19 0408) ?BP: 108/81 (03/19 0408) ?Pulse Rate: 63 (03/19 0408) ? ?Labs: ?Recent Labs  ?  03/03/22 ?03/05/22 03/04/22 ?0111 03/05/22 ?0602  ?HGB 11.9* 11.1* 11.3*  ?HCT 35.8* 34.4* 34.8*  ?PLT 269 272 271  ?APTT 74* 69* 62*  ?HEPARINUNFRC 0.99* 0.75* 0.76*  ? ? ?Estimated Creatinine Clearance: 69.3 mL/min (by C-G formula based on SCr of 0.98 mg/dL). ? ?Assessment: ?51 yo female presents with right ICH with brain compression and found to have bilateral pulmonary embolism without RHS. PTA the patient is not on anticoagulation. The patient was started on apixaban on 3/11, but was transitioned to heparin on 3/15 while awaiting PEG tube placement. Last apixaban dose on 3/14 @ 2126, therefore, will monitor heparin therapy with aPTT until it starts to correlate with the heparin level. Pharmacy is consulted to dose heparin.  ?  ?aPTT is subtherapeutic at 62 while on heparin at 1100 units/hr. There have been no signs or symptoms of bleeding documented, and no issues with the infusion. Hgb 11.3, platelets 271. The heparin infusion will be stopped prior to IR procedure on 3/20 (stop date in), and can be restarted 4-6 hours post-procedure if there is no bleeding/oozing from the site. ? ?Goal of Therapy:  ?Heparin level 0.3-0.5 units/ml ?aPTT 66-85 seconds ?Monitor platelets by anticoagulation protocol: Yes ?  ?Plan:  ?Increase heparin IV to 1150 units/hr - stop prior to procedure on 3/20 ?Monitor for signs and symptoms of bleeding ?Obtain a 6-hour aPTT ?Monitor a daily aPTT, heparin level, and CBC ?Follow ability to restart heparin 4-6 hours  post-procedure ? ?4/20, PharmD, RPh  ?PGY-2 Pharmacy Resident ?03/05/2022 7:21 AM ? ?Please check [email or web address] for unit-specific pharmacy phone numbers. ? ?Addendum: 6-hour aPTT returned therapeutic at 67. Will continue the heparin infusion at the current rate of 1150 units/hr until it is stopped tomorrow morning prior to procedure. All heparin adjustments are being made conservatively due to the patient's high risk of hemorrhage. Will follow the ability to restart heparin 4-6 hours post-procedure. ? ?03/07/2022, PharmD, RPh  ?PGY-2 Pharmacy Resident ?03/05/2022 2:18 PM ? ?Please check [email or web address] for unit-specific pharmacy phone numbers. ? ? ?

## 2022-03-05 NOTE — Progress Notes (Signed)
?PROGRESS NOTE ? ? ? ?Sarah Myers  WEX:[id number] DOB: November 18, 1971 DOA: 02/05/2022 ?PCP: [name], MD  ? ? ?Brief Narrative:  ?51 year old with history of hypertension presented to the emergency room with left-sided weakness and slurred speech.  She was found to have dense left hemiplegia and intracranial hemorrhage. ?2/19, admitted to intensive care unit with left-sided dense hemiplegia and slurred speech and found to have right intracranial hemorrhage with brain compression present on admission.  Treated with 3% saline, Cleviprex and intubated.  She failed intubation x2 and underwent tracheostomy on 2/28 by PCCM. ?2/28, tracheostomy. ?3/1 trach collar, off ventilator on 3/4. ?Transferred to medical floor on 3/6. ?Hospital course complicated by MSSA pneumonia, completed antibiotic therapy.  Waiting for PEG tube placement. ? ?Assessment & Plan: ?  ?Acute right basal ganglia hemorrhage, hypertensive emergency: ?CT head on arrival, acute right lentiform hemorrhage with mass effect 5 mm left shift ?CT head 2/26 stable hematoma ?Echocardiogram, essentially normal.  Normal ejection fraction. ?CT head 3/8, expected regression of right ICH. ?CT head 3/12 continued expected interval evolution of intraparenchymal hematoma ?Followed by critical care and neurology and currently signed off. ?Patient is on multiple oral medications including Norvasc 10 mg, clonidine 0.1 mg 3 times daily, Coreg 25 mg twice daily, spironolactone 25 mg daily, Cozaar 100 mg daily.  Blood pressures are acceptable. ?Klonopin and Seroquel for agitation and discomfort.  Adequate pain medications. ? ?Acute respiratory insufficiency secondary to massive intracranial hemorrhage: ?2/28, tracheostomy ?3/1, trach collar. ?3/16, downsized to cuffless #6 trach.  Currently on room air. ? 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  Patient has Dobbhoff tube in place.  Speech therapy continues to follow.  Even though she is waking up, she declines to put  food in the mouth or start swallowing.  Will not have enough nutrition and calorie intake with oral trial. ?Scheduled for PEG tube placement on 3/20. ?Mouth care.  Cannot use oral nystatin.  We will give her 1 dose of Diflucan. ? ?Bilateral pulmonary embolism: Detected on CT angiogram 3/7.  Patient was initially on heparin, then transitioned to Eliquis.  Keep on heparin periprocedure. ?Cleared for anticoagulation by neurology on 3/8, Dr. Erlinda Hong. ? ?Hypercalcemia: Probably secondary to dehydration/calcium mobilization from immobility.  PTH is normal.  Not on supplements.  Will monitor. ? ?Goal of care: Critically sick.  High risk of rehemorrhage on anticoagulation.  Palliative care, family meeting 3/16. ?Full scope of treatment. ?Trach and PEG and nursing home placement. ? ? ?DVT prophylaxis: SCD's Start: 02/05/22 0846.  Heparin infusion. ? ? ?Code Status: Full code ?Family Communication: Daughter [name] on the phone 3/18. ?Disposition Plan: Status is: Inpatient ?Remains inpatient appropriate because: Unsafe disposition plan.  No oral intake. ?  ? ? ?Consultants:  ?Neurology ?Neurosurgery ?Critical care ?Palliative care ? ?Procedures:  ?Tracheostomy 2/28 ? ?Antimicrobials:  ?Cefazolin 3/8--- 3/16. ? ? ?Subjective: ? ?Seen and examined.  Looks comfortable.  She was sleepy today after getting a dose of Norco.  No overnight events. ? ?Objective: ?Vitals:  ? 03/05/22 0408 03/05/22 0500 03/05/22 0750 03/05/22 0753  ?BP: 108/81  108/79   ?Pulse: 63   71  ?Resp: 15   17  ?Temp: 97.8 ?F (36.6 ?C)  98.3 ?F (36.8 ?C)   ?TempSrc: Oral  Axillary   ?SpO2: 99%   100%  ?Weight:  76 kg    ?Height:      ? ? ?Intake/Output Summary (Last 24 hours) at 03/05/2022 1114 ?Last data filed at 03/05/2022 0737 ?Gross per 24 hour  ?  Intake 270.71 ml  ?Output 1600 ml  ?Net -1329.29 ml  ? ?Filed Weights  ? 03/03/22 0419 03/04/22 0318 03/05/22 0500  ?Weight: 72.6 kg 74.3 kg 76 kg  ? ? ?Examination: ? ?General exam: Chronically sick looking, frail and  debilitated.  Mostly sleepy.  Pushes away on the right side.  Does not move left side. ?Tongue is coated and thick. ?Respiratory system: Clear to auscultation. Respiratory effort normal.  Conducted upper airway sounds.  Mostly on room air.  Trach site clean and dry. ?Cardiovascular system: S1 & S2 heard, RRR.  ?Gastrointestinal system: Soft.  Nontender.  Bowel sound present. ? ? ? ?Data Reviewed: I have personally reviewed following labs and imaging studies ? ?CBC: ?Recent Labs  ?Lab 03/02/22 ?0407 03/03/22 ?3235 03/04/22 ?0111 03/05/22 ?0602  ?WBC 7.9 6.8 7.2 6.6  ?HGB 11.4* 11.9* 11.1* 11.3*  ?HCT 35.3* 35.8* 34.4* 34.8*  ?MCV 89.4 88.4 90.1 89.2  ?PLT 247 269 272 271  ? ?Basic Metabolic Panel: ?Recent Labs  ?Lab 02/26/22 ?1218 02/27/22 ?0232 02/28/22 ?0306  ?NA  --  133* 132*  ?K  --  4.6 4.6  ?CL  --  102 101  ?CO2  --  23 24  ?GLUCOSE  --  123* 141*  ?BUN  --  34* 39*  ?CREATININE  --  0.94 0.98  ?CALCIUM 11.4* 11.5* 11.1*  ? ?GFR: ?Estimated Creatinine Clearance: 69.3 mL/min (by C-G formula based on SCr of 0.98 mg/dL). ?Liver Function Tests: ?No results for input(s): AST, ALT, ALKPHOS, BILITOT, PROT, ALBUMIN in the last 168 hours. ?No results for input(s): LIPASE, AMYLASE in the last 168 hours. ?No results for input(s): AMMONIA in the last 168 hours. ?Coagulation Profile: ?No results for input(s): INR, PROTIME in the last 168 hours. ?Cardiac Enzymes: ?No results for input(s): CKTOTAL, CKMB, CKMBINDEX, TROPONINI in the last 168 hours. ?BNP (last 3 results) ?No results for input(s): PROBNP in the last 8760 hours. ?HbA1C: ?No results for input(s): HGBA1C in the last 72 hours. ?CBG: ?Recent Labs  ?Lab 03/04/22 ?1613 03/04/22 ?1946 03/04/22 ?2338 03/05/22 ?0408 03/05/22 ?5732  ?GLUCAP 120* 141* 103* 117* 127*  ? ?Lipid Profile: ?No results for input(s): CHOL, HDL, LDLCALC, TRIG, CHOLHDL, LDLDIRECT in the last 72 hours. ?Thyroid Function Tests: ?No results for input(s): TSH, T4TOTAL, FREET4, T3FREE, THYROIDAB in the  last 72 hours. ?Anemia Panel: ?No results for input(s): VITAMINB12, FOLATE, FERRITIN, TIBC, IRON, RETICCTPCT in the last 72 hours. ?Sepsis Labs: ?No results for input(s): PROCALCITON, LATICACIDVEN in the last 168 hours. ? ?Recent Results (from the past 240 hour(s))  ?Urine Culture     Status: None  ? Collection Time: 02/26/22  7:02 AM  ? Specimen: Urine, Catheterized  ?Result Value Ref Range Status  ? Specimen Description URINE, CATHETERIZED  Final  ? Special Requests NONE  Final  ? Culture   Final  ?  NO GROWTH ?Performed at [facility], [place] [address] [place], [place] [address] ?  ? Report Status 02/27/2022 FINAL  Final  ? ?  ? ? ? ? ? ?Radiology Studies: ?No results found. ? ? ? ? ? ?Scheduled Meds: ? amLODipine  10 mg Per Tube Daily  ? carvedilol  25 mg Per Tube BID WC  ? chlorhexidine gluconate (MEDLINE KIT)  15 mL Mouth Rinse BID  ? Chlorhexidine Gluconate Cloth  6 each Topical Q0600  ? clonazePAM  1.5 mg Per Tube BID  ? feeding supplement (PROSource TF)  45 mL Per Tube BID  ?  free water  200 mL Per Tube Q8H  ? guaiFENesin  200 mg Per Tube Q6H  ? insulin aspart  0-15 Units Subcutaneous Q4H  ? losartan  100 mg Per Tube Daily  ? mouth rinse  15 mL Mouth Rinse 10 times per day  ? multivitamin with minerals  1 tablet Per Tube Daily  ? nutrition supplement (JUVEN)  1 packet Per Tube BID BM  ? pantoprazole sodium  40 mg Per Tube Daily  ? QUEtiapine  50 mg Per Tube QHS  ? spironolactone  50 mg Per Tube Daily  ? ?Continuous Infusions: ? feeding supplement (JEVITY 1.5 CAL/FIBER) 1,000 mL (03/05/22 0010)  ? heparin 1,100 Units/hr (03/05/22 0737)  ? ? ? LOS: 28 days  ? ? ?Time spent: 35 minutes ? ? ? ?Barb Merino, MD ?[facility] ?Pager [phone] ? ?

## 2022-03-06 ENCOUNTER — Inpatient Hospital Stay (HOSPITAL_COMMUNITY): Payer: 59

## 2022-03-06 HISTORY — PX: IR GASTROSTOMY TUBE MOD SED: IMG625

## 2022-03-06 LAB — CBC
HCT: 35.6 % — ABNORMAL LOW (ref 36.0–46.0)
Hemoglobin: 11.5 g/dL — ABNORMAL LOW (ref 12.0–15.0)
MCH: 29.3 pg (ref 26.0–34.0)
MCHC: 32.3 g/dL (ref 30.0–36.0)
MCV: 90.6 fL (ref 80.0–100.0)
Platelets: 244 10*3/uL (ref 150–400)
RBC: 3.93 MIL/uL (ref 3.87–5.11)
RDW: 14.4 % (ref 11.5–15.5)
WBC: 6.7 10*3/uL (ref 4.0–10.5)
nRBC: 0 % (ref 0.0–0.2)

## 2022-03-06 LAB — APTT: aPTT: 34 seconds (ref 24–36)

## 2022-03-06 LAB — GLUCOSE, CAPILLARY
Glucose-Capillary: 100 mg/dL — ABNORMAL HIGH (ref 70–99)
Glucose-Capillary: 108 mg/dL — ABNORMAL HIGH (ref 70–99)
Glucose-Capillary: 126 mg/dL — ABNORMAL HIGH (ref 70–99)
Glucose-Capillary: 145 mg/dL — ABNORMAL HIGH (ref 70–99)
Glucose-Capillary: 91 mg/dL (ref 70–99)
Glucose-Capillary: 99 mg/dL (ref 70–99)

## 2022-03-06 LAB — HEPARIN LEVEL (UNFRACTIONATED): Heparin Unfractionated: 0.37 IU/mL (ref 0.30–0.70)

## 2022-03-06 MED ORDER — FENTANYL CITRATE (PF) 100 MCG/2ML IJ SOLN
INTRAMUSCULAR | Status: AC | PRN
  Administered 2022-03-06 (×2): 25 ug via INTRAVENOUS

## 2022-03-06 MED ORDER — CEFAZOLIN SODIUM-DEXTROSE 2-4 GM/100ML-% IV SOLN: 2.0000 g | INTRAVENOUS | Status: AC

## 2022-03-06 MED ORDER — HEPARIN (PORCINE) 25000 UT/250ML-% IV SOLN
1250.0000 [IU]/h | INTRAVENOUS | Status: DC
  Administered 2022-03-06: 1150 [IU]/h via INTRAVENOUS
  Filled 2022-03-06: qty 250

## 2022-03-06 MED ORDER — CEFAZOLIN SODIUM-DEXTROSE 2-4 GM/100ML-% IV SOLN
INTRAVENOUS | Status: AC
  Administered 2022-03-06: 2 g via INTRAVENOUS
  Filled 2022-03-06: qty 100

## 2022-03-06 MED ORDER — FENTANYL CITRATE (PF) 100 MCG/2ML IJ SOLN
INTRAMUSCULAR | Status: AC
  Filled 2022-03-06: qty 2

## 2022-03-06 MED ORDER — MIDAZOLAM HCL 2 MG/2ML IJ SOLN
INTRAMUSCULAR | Status: AC | PRN
  Administered 2022-03-06 (×3): .5 mg via INTRAVENOUS

## 2022-03-06 MED ORDER — IOHEXOL 300 MG/ML  SOLN
100.0000 mL | Freq: Once | INTRAMUSCULAR | Status: AC | PRN
  Administered 2022-03-06: 15 mL

## 2022-03-06 MED ORDER — GLUCAGON HCL (RDNA) 1 MG IJ SOLR
INTRAMUSCULAR | Status: AC | PRN
  Administered 2022-03-06: 1 mg via INTRAVENOUS

## 2022-03-06 MED ORDER — IPRATROPIUM-ALBUTEROL 0.5-2.5 (3) MG/3ML IN SOLN: 3.0000 mL | Freq: Four times a day (QID) | RESPIRATORY_TRACT | Status: DC

## 2022-03-06 MED ORDER — STERILE WATER FOR INJECTION IJ SOLN
INTRAMUSCULAR | Status: AC
  Filled 2022-03-06: qty 20

## 2022-03-06 MED ORDER — LIDOCAINE-EPINEPHRINE 1 %-1:100000 IJ SOLN
INTRAMUSCULAR | Status: AC
  Filled 2022-03-06: qty 1

## 2022-03-06 MED ORDER — LIDOCAINE-EPINEPHRINE 1 %-1:100000 IJ SOLN
INTRAMUSCULAR | Status: DC | PRN
  Administered 2022-03-06: 20 mL

## 2022-03-06 MED ORDER — GLUCAGON HCL RDNA (DIAGNOSTIC) 1 MG IJ SOLR
INTRAMUSCULAR | Status: AC
  Filled 2022-03-06: qty 1

## 2022-03-06 MED ORDER — LIDOCAINE HCL 1 % IJ SOLN
INTRAMUSCULAR | Status: AC
  Filled 2022-03-06: qty 20

## 2022-03-06 MED ORDER — MIDAZOLAM HCL 2 MG/2ML IJ SOLN
INTRAMUSCULAR | Status: AC
  Filled 2022-03-06: qty 2

## 2022-03-06 MED ORDER — LIDOCAINE VISCOUS HCL 2 % MT SOLN
OROMUCOSAL | Status: AC
  Filled 2022-03-06: qty 15

## 2022-03-06 NOTE — Progress Notes (Signed)
Nutrition Follow-up ? ?DOCUMENTATION CODES:  ?Not applicable ? ?INTERVENTION:  ?Continue TF via Cortrak:  ?-Jevity 1.5 @ 55 ml/h (1320 ml per day) ?-ProSource TF to 45 ml BID ?-Free water per MD, currently 28m Q8H ?  ?Provides 2060 kcal, 105 gm protein, 1006 ml free water daily (1601mtotal free water) ? ?Regimen may be continued once PEG is placed and ready for use ?  ?-MVI daily per tube ?-1 packet Juven BID per tube, each packet provides 95 calories, 2.5 grams of protein (collagen), and 9.8 grams of carbohydrate (3 grams sugar); also contains 7 grams of L-arginine and L-glutamine, 300 mg vitamin C, 15 mg vitamin E, 1.2 mcg vitamin B-12, 9.5 mg zinc, 200 mg calcium, and 1.5 g  Calcium Beta-hydroxy-Beta-methylbutyrate to support wound healing ? ?NUTRITION DIAGNOSIS:  ?Inadequate oral intake related to inability to eat as evidenced by NPO status. ?Ongoing.  ? ?GOAL:  ?Patient will meet greater than or equal to 90% of their needs ?Met with TF at goal.  ? ?MONITOR:  ?TF tolerance, I & O's ? ?REASON FOR ASSESSMENT:  ?Consult ?Enteral/tube feeding initiation and management ? ?ASSESSMENT:  ?Pt with PMH of HTN admitted with acute R ICH  and midline shift. 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 ?  ?2/24- cortrak placed (gastric)  ?2/28- s/p trach ?3/01- trach collar  ?3/04- taken off vent ?3/06- tx to floor ?3/16- downsized to cuffless #6 trach   ? ?Pt sleeping at time of RD visit. Pt remains on TC receiving TF via Cortrak. Current TF orders are Jevity 1.5 @ 5514mr, 35m53mosource TF BID and 200ml69me water Q8H. Toleratng TF per RN. Per MD, pt to have PEG placed in IR today. TF orders may be continued once tube is ready for use.  ? ?UOP: 500ml 37mhours ?I/O: +4089ml s2m admit ? ?Admit weight 74.8 kg ?Current wt 75.9 kg ?Pt with generalized mild pitting edema ? ?Medications: glucagon, SSI Q4H, mvi w/ minerals, juven BID, protonix, aldactone, IV abx ?Labs: Na 132 (L) ?CBGs: 92-121 x 24  hours ? ?Diet Order:   ?Diet Order   ? ?       ?  Diet NPO time specified  Diet effective now       ?  ? ?  ?  ? ?  ? ?EDUCATION NEEDS:  ?Not appropriate for education at this time ? ?Skin: stg III pressure injury noted to L cervical per RN assessment ? ?Last BM:  3/19 type 5 ? ?Height:  ?Ht Readings from Last 1 Encounters:  ?02/05/22 '5\' 4"'  (1.626 m)  ? ?Weight:  ?Wt Readings from Last 1 Encounters:  ?03/06/22 75.9 kg  ? ?BMI:  Body mass index is 28.72 kg/m?. ? ?Estimated Nutritional Needs:  ?Kcal:  1900-2100 ?Protein:  105-120 grams ?Fluid:  > 1.9 L ? ? ? ?Sarah Myers Theone StanleyRD, LDN (she/her/hers) ?RD pager number and weekend/on-call pager number located in [email or web address]. [place]

## 2022-03-06 NOTE — Progress Notes (Signed)
? ?NAME:  Sarah Myers, MRN:  [id number], DOB:  12-Aug-1971, LOS: 29 ?ADMISSION DATE:  02/05/2022, CONSULTATION DATE:  2/19 ?REFERRING MD:  Selina Cooley - neuro, CHIEF COMPLAINT:  Left sided weakness, slurred speech  ? ?History of Present Illness:  ?52 y/o female admitted to [facility] on 2/19 with left sided weakness and slurred speech.  Work up consistent with  a right ICH with "brain compression" present on admission.  Required intubation for airway protection, 3% saline, & cleviprex.  Hospital course complicated by prolonged respiratory failure with failed extubations due to stridor requiring tracheostomy (2/28).  ? ?Pertinent  Medical History  ?Hypertension ? ?Significant Hospital Events: ?Including procedures, antibiotic start and stop dates in addition to other pertinent events   ?2/19 admitted to neuro with acute R ICH. Intubated for airway protection. Started on 3% and  cleviprex. PCCM consult. NSGY consult  ?02/06/2022 For repeat CT Head >> Overnight CT showed increase in size of bleed to 36 ml , but unchanged midline shift at 4 mm . Repeating CT head again 2/20 am. Neurosurgery is aware ?2/21 3% saline stopped due to Na 158. weaned off cleviprex. Remains intubated. ?2/22 neuro exam much improved (moves RUE and RLE to command; thumbs up; blinks eyes to command; sticks tongue out); sinus pause overnight w/ inferiolateral ST elevation overnight; troponin 387 then 269 ?2/23 failed extubation due to stridor. Reintubated ?2/24 Diuresis ?2/26 failed 2nd attempt at extubation. CT head ICH without rebleeding. Hematoma and edema causes 4 mm of midline shift. 2. Bilateral cribriform plate cephalocele. ?2/28 tracheostomy by Kendrick Fries, stopped versed infusion ?3/1 trach collar x1 hour ?3/2 worked with PT ?3/5 Off vent 24 hours  ?3/10 Mild hemoptysis from trach on heparin infusion ?03/01/2022 no overt hemoptysis noted remains on heparin infusion ?03/01/2022 changed to cuffless trach ? ?Interim History / Subjective:  ? ?Upon entering  the room patient was not attached to trach collar as it had be disconnected. SpO2 was in the 20s and patient was in respiratory distress. O2 tubing was reconnected with improvement of SpO2 into the 50s. RT and nursing were called to the bedside, suctioning and bagging was performed with out issue and removal of mucous plug. SpO2 improved into the 90s afterward.  ? ?Otherwise no issues with secretions previously noted. ? ?Objective   ?Blood pressure 119/88, pulse 74, temperature 98.3 ?F (36.8 ?C), resp. rate 15, height 5\' 4"  (1.626 m), weight 75.9 kg, SpO2 96 %. ?   ?FiO2 (%):  [28 %-35 %] 35 %  ? ?Intake/Output Summary (Last 24 hours) at 03/06/2022 1516 ?Last data filed at 03/06/2022 03/08/2022 ?Gross per 24 hour  ?Intake 759.48 ml  ?Output 500 ml  ?Net 259.48 ml  ? ?Filed Weights  ? 03/04/22 0318 03/05/22 0500 03/06/22 0500  ?Weight: 74.3 kg 76 kg 75.9 kg  ? ? ?Examination: ?General appearance: 51 y.o., female, nad - after suctioning ?HENT: NCAT; MMM ?Neck: 4 cuffed shiley flex in place ?Lungs: CTAB, with normal respiratory effort ?CV: RRR, no murmur  ?Abdomen: Soft, non-tender; non-distended, BS present  ?Extremities: No peripheral edema, warm ? ?Resolved Hospital Problem list   ?MSSA/Serratia pneumonia, fever on 2/28 due to pneumonia  ?AKI   ?Iatrogenic hypernatremia from hypertonic saline given for brain swelling  ?Agitated Delirium  ?Hypertensive Emergency  ?Acute Cardiogenic Pulmonary Edema  ?Stridor due to upper airway edema, failed extubation x 2 ?Hemoptysis  ? ?Assessment & Plan:  ?  ?Acute Hypoxemic Respiratory Failure s/p Tracheostomy  ?Pulmonary Embolism  ?Trach placed 2/28 per  B. McQuaid #6 cuffed, change to 4 cuffless shiley 3/16  ?Continue routine trach care ?Monitor closely for further mucous plugging ? ?Right temporal hematoma with brain compression   ?Acute Encephalopathy in setting of Stroke  ?Per [facility] and neurology ? ? ? ? ? ?Best Practice (right click and "Reselect all SmartList Selections" daily)   ?Per primary ? ?PCCM will continue to follow weekly. Please call sooner if new needs arise.  ? ?Critical care time: n/a minutes ?  ? ?Melody Comas, MD ?[facility] ?Office: [phone] ? ? ?See Amion for personal pager ?PCCM on call pager [phone] until 7pm. ?Please call Elink 7p-7a. [phone] ? ? ? ? ? ? ? ? ? ? ? ? ? ?

## 2022-03-06 NOTE — Progress Notes (Signed)
PT Cancellation Note ? ?Patient Details ?Name: Sarah Myers ?MRN: [id number] ?DOB: [birth year] ? ? ?Cancelled Treatment:    Reason Eval/Treat Not Completed: Patient at procedure or test/unavailable ? ? ?[name] [facility] ?03/06/2022, 1:22 PM ?[name] PT ?Acute Rehabilitation Services ?Pager [phone] ?Office [phone] ? ? ? ?

## 2022-03-06 NOTE — Progress Notes (Signed)
RT NOTE: RT attempted to check on trach X 2 but patient gone to IR for procedure at this time. RT will perform trach check when patient returns.  ?

## 2022-03-06 NOTE — Progress Notes (Signed)
?PROGRESS NOTE ? ? ? ?SILVA AAMODT  PTW:[id number] DOB: 1971/11/29 DOA: 02/05/2022 ?PCP: [name], MD  ? ? ?Brief Narrative:  ?51 year old with history of hypertension presented to the emergency room with left-sided weakness and slurred speech.  She was found to have dense left hemiplegia and intracranial hemorrhage. ?2/19, admitted to intensive care unit with left-sided dense hemiplegia and slurred speech and found to have right intracranial hemorrhage with brain compression present on admission.  Treated with 3% saline, Cleviprex and intubated.  She failed intubation x2 and underwent tracheostomy on 2/28 by PCCM. ?2/28, tracheostomy. ?3/1 trach collar, off ventilator on 3/4. ?Transferred to medical floor on 3/6. ?Hospital course complicated by MSSA pneumonia, completed antibiotic therapy.  Waiting for PEG tube placement. ? ?Assessment & Plan: ?  ?Acute right basal ganglia hemorrhage, hypertensive emergency: ?CT head on arrival, acute right lentiform hemorrhage with mass effect 5 mm left shift ?CT head 2/26 stable hematoma ?Echocardiogram, essentially normal.  Normal ejection fraction. ?CT head 3/8, expected regression of right ICH. ?CT head 3/12 continued expected interval evolution of intraparenchymal hematoma ?Was Followed by critical care and neurology and currently signed off. ?Patient is on multiple oral medications including Norvasc 10 mg, clonidine 0.1 mg 3 times daily, Coreg 25 mg twice daily, spironolactone 25 mg daily, Cozaar 100 mg daily.  Blood pressures are acceptable. ?Klonopin and Seroquel for agitation and discomfort.  Adequate pain medications. ?3/20, symptoms much controlled and comfortable night after increasing Seroquel 100 mg at night. ? ?Acute respiratory insufficiency secondary to massive intracranial hemorrhage: ?2/28, tracheostomy ?3/1, trach collar. ?3/16, downsized to cuffless #6 trach.  Currently on trach collar with minimum oxygen. ? 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   Patient has Dobbhoff tube in place.  Speech therapy continues to follow.   ?Patient is unable to eat and meet requirements of calories and fluid intake.   ?Per PEG tube placement today by IR.   ? ?Bilateral pulmonary embolism: Detected on CT angiogram 3/7.  Patient was initially on heparin, then transitioned to Eliquis.  Keep on heparin periprocedure. ?Cleared for anticoagulation by neurology on 3/8, Dr. Erlinda Hong.  We will change to Eliquis after procedure. ? ?Hypercalcemia: Probably secondary to dehydration/calcium mobilization from immobility.  PTH is normal.  Not on supplements.  Will monitor.  Recheck tomorrow. ? ?Goal of care:  ?Critically sick.  High risk of rehemorrhage on anticoagulation.  Palliative care, family meeting 3/16.  She is very high risk of decompensation, she is very high risk of getting another bleeding. ?Also with pain and agitation, currently trying to work around with pain medications and Seroquel. ?Full scope of treatment. ?Trach and PEG and nursing home placement. ? ? ?DVT prophylaxis: SCD's Start: 02/05/22 0846.  Heparin infusion. ? ? ?Code Status: Full code ?Family Communication: None today. ?Disposition Plan: Status is: Inpatient ?Remains inpatient appropriate because: Unsafe disposition plan.  No oral intake. ?  ? ? ?Consultants:  ?Neurology ?Neurosurgery ?Critical care ?Palliative care ? ?Procedures:  ?Tracheostomy 2/28 ? ?Antimicrobials:  ?Cefazolin 3/8--- 3/16. ? ? ?Subjective: ? ?Patient seen and examined.  She is not as restless as she was yesterday.  Still pretty comfortable overnight.  No additional medications needed for pain and discomfort. ?She would respond to stimuli but mostly go back to sleep. ? ?Objective: ?Vitals:  ? 03/06/22 0500 03/06/22 7001 03/06/22 0805 03/06/22 0924  ?BP:  131/75  118/72  ?Pulse:   65 64  ?Resp:   16   ?Temp:  98.3 ?F (36.8 ?C)    ?  TempSrc:      ?SpO2:   100%   ?Weight: 75.9 kg     ?Height:      ? ? ?Intake/Output Summary (Last 24 hours) at 03/06/2022  1025 ?Last data filed at 03/06/2022 0600 ?Gross per 24 hour  ?Intake 721.32 ml  ?Output 500 ml  ?Net 221.32 ml  ? ?Filed Weights  ? 03/04/22 0318 03/05/22 0500 03/06/22 0500  ?Weight: 74.3 kg 76 kg 75.9 kg  ? ? ?Examination: ? ?General exam: Chronically sick looking, frail and debilitated.  Mostly sleepy. Does not move left side. ?Respiratory system: Clear to auscultation. Respiratory effort normal.  Conducted upper airway sounds.  Mostly on room air.  Trach site clean and dry. ?Cardiovascular system: S1 & S2 heard, RRR.  ?Gastrointestinal system: Soft.  Nontender.  Bowel sound present. ? ? ? ?Data Reviewed: I have personally reviewed following labs and imaging studies ? ?CBC: ?Recent Labs  ?Lab 03/02/22 ?0407 03/03/22 ?6599 03/04/22 ?0111 03/05/22 ?0602 03/06/22 ?0409  ?WBC 7.9 6.8 7.2 6.6 6.7  ?HGB 11.4* 11.9* 11.1* 11.3* 11.5*  ?HCT 35.3* 35.8* 34.4* 34.8* 35.6*  ?MCV 89.4 88.4 90.1 89.2 90.6  ?PLT 247 269 272 271 244  ? ?Basic Metabolic Panel: ?Recent Labs  ?Lab 02/28/22 ?0306  ?NA 132*  ?K 4.6  ?CL 101  ?CO2 24  ?GLUCOSE 141*  ?BUN 39*  ?CREATININE 0.98  ?CALCIUM 11.1*  ? ?GFR: ?Estimated Creatinine Clearance: 69.3 mL/min (by C-G formula based on SCr of 0.98 mg/dL). ?Liver Function Tests: ?No results for input(s): AST, ALT, ALKPHOS, BILITOT, PROT, ALBUMIN in the last 168 hours. ?No results for input(s): LIPASE, AMYLASE in the last 168 hours. ?No results for input(s): AMMONIA in the last 168 hours. ?Coagulation Profile: ?No results for input(s): INR, PROTIME in the last 168 hours. ?Cardiac Enzymes: ?No results for input(s): CKTOTAL, CKMB, CKMBINDEX, TROPONINI in the last 168 hours. ?BNP (last 3 results) ?No results for input(s): PROBNP in the last 8760 hours. ?HbA1C: ?No results for input(s): HGBA1C in the last 72 hours. ?CBG: ?Recent Labs  ?Lab 03/05/22 ?1618 03/05/22 ?1951 03/05/22 ?2341 03/06/22 ?3570 03/06/22 ?0837  ?GLUCAP 115* 121* 92 99 100*  ? ?Lipid Profile: ?No results for input(s): CHOL, HDL, LDLCALC,  TRIG, CHOLHDL, LDLDIRECT in the last 72 hours. ?Thyroid Function Tests: ?No results for input(s): TSH, T4TOTAL, FREET4, T3FREE, THYROIDAB in the last 72 hours. ?Anemia Panel: ?No results for input(s): VITAMINB12, FOLATE, FERRITIN, TIBC, IRON, RETICCTPCT in the last 72 hours. ?Sepsis Labs: ?No results for input(s): PROCALCITON, LATICACIDVEN in the last 168 hours. ? ?Recent Results (from the past 240 hour(s))  ?Urine Culture     Status: None  ? Collection Time: 02/26/22  7:02 AM  ? Specimen: Urine, Catheterized  ?Result Value Ref Range Status  ? Specimen Description URINE, CATHETERIZED  Final  ? Special Requests NONE  Final  ? Culture   Final  ?  NO GROWTH ?Performed at [facility], [place] [address], [place], [place] [address] ?  ? Report Status 02/27/2022 FINAL  Final  ? ?  ? ? ? ? ? ?Radiology Studies: ?No results found. ? ? ? ? ? ?Scheduled Meds: ? amLODipine  10 mg Per Tube Daily  ? carvedilol  25 mg Per Tube BID WC  ? chlorhexidine gluconate (MEDLINE KIT)  15 mL Mouth Rinse BID  ? clonazePAM  1.5 mg Per Tube BID  ? feeding supplement (PROSource TF)  45 mL Per Tube BID  ? free water  200  mL Per Tube Q8H  ? guaiFENesin  200 mg Per Tube Q6H  ? insulin aspart  0-15 Units Subcutaneous Q4H  ? losartan  100 mg Per Tube Daily  ? mouth rinse  15 mL Mouth Rinse 10 times per day  ? multivitamin with minerals  1 tablet Per Tube Daily  ? nutrition supplement (JUVEN)  1 packet Per Tube BID BM  ? pantoprazole sodium  40 mg Per Tube Daily  ? QUEtiapine  100 mg Per Tube QHS  ? spironolactone  50 mg Per Tube Daily  ? ?Continuous Infusions: ?  ceFAZolin (ANCEF) IV    ? feeding supplement (JEVITY 1.5 CAL/FIBER) 1,000 mL (03/05/22 2040)  ? ? ? LOS: 29 days  ? ? ?Time spent: 35 minutes ? ? ? ?Barb Merino, MD ?[facility] ?Pager [phone] ? ?

## 2022-03-06 NOTE — Progress Notes (Signed)
Per Dr.Ghimire.  Hold tube feeds for 24 hrs, patient to remain NPO except meds.  Heparin gtt will be restarted at 7pm. ?

## 2022-03-06 NOTE — Sedation Documentation (Signed)
No ETCO2 monitoring via Nasal Canula, as pt is on tracheostomy  ?

## 2022-03-06 NOTE — Procedures (Signed)
Interventional Radiology Procedure Note ? ?Date of Procedure: 03/06/2022  ?Procedure: G tube placement  ? ?Findings:  ?1. G tube placement (20 Fr balloon retention) using fluoroscopic guidance   ? ?Complications: No immediate complications noted.  ? ?Estimated Blood Loss: minimal ? ?Follow-up and Recommendations: ?1. Refer to EMR for instructions  ? ? ?Olive Bass, MD  ?Vascular & Interventional Radiology  ?03/06/2022 12:29 PM ? ? ? ?

## 2022-03-06 NOTE — Progress Notes (Signed)
ANTICOAGULATION CONSULT NOTE ? ?Pharmacy Consult for Heparin  ?Indication: pulmonary embolus and CVA ? ?Allergies  ?Allergen Reactions  ? Lisinopril Cough  ? ? ?Patient Measurements: ?Height: 5\' 4"  (162.6 cm) ?Weight: 75.9 kg (167 lb 5.3 oz) ?IBW/kg (Calculated) : 54.7 ? ?Heparin Dosing Weight: 70 kg ? ?Vital Signs: ?Temp: 98.3 ?F (36.8 ?C) (03/20 01-27-1970) ?Temp Source: Axillary (03/20 0425) ?BP: 119/88 (03/20 1210) ?Pulse Rate: 77 (03/20 1433) ? ?Labs: ?Recent Labs  ?  03/04/22 ?0111 03/05/22 ?0602 03/05/22 ?1315 03/06/22 ?0409  ?HGB 11.1* 11.3*  --  11.5*  ?HCT 34.4* 34.8*  --  35.6*  ?PLT 272 271  --  244  ?APTT 69* 62* 67* 34  ?HEPARINUNFRC 0.75* 0.76*  --  0.37  ? ? ?Estimated Creatinine Clearance: 69.3 mL/min (by C-G formula based on SCr of 0.98 mg/dL). ? ? ?Assessment: ?51 yo female presents with right ICH with brain compression and found to have bilateral pulmonary emboli without RHS. PTA the patient is not on anticoagulation. The patient was started on apixaban on 3/11 but transitioned to heparin on 3/15 while awaiting PEG tube placement. Last apixaban dose on 3/14 @ 2100. Pharmacy is consulted to dose heparin.  ?  ?Heparin infusion stopped prior to IR procedure on 3/20, will be restarted 6 hours post-procedure. CBC stable, no reported s/sx bleeding post-procedure. Will restart heparin gtt without bolus. ?  ?Goal of Therapy:  ?Heparin level 0.3-0.5 units/ml ?aPTT 66-85 seconds ?Monitor platelets by anticoagulation protocol: Yes ? ?Plan:  ?Restart heparin infusion at 19:00 @ 1150 units/hr ?Check heparin level in 6 hours and daily while on heparin ?Continue to monitor H&H and platelets ?Follow-up transitioning to apixaban ? ? ? ?Thank you for allowing pharmacy to be a part of this patient?s care. ? ?4/20, PharmD ?Clinical Pharmacist ? ? ?

## 2022-03-07 LAB — COMPREHENSIVE METABOLIC PANEL
ALT: 19 U/L (ref 0–44)
AST: 28 U/L (ref 15–41)
Albumin: 3.6 g/dL (ref 3.5–5.0)
Alkaline Phosphatase: 72 U/L (ref 38–126)
Anion gap: 12 (ref 5–15)
BUN: 66 mg/dL — ABNORMAL HIGH (ref 6–20)
CO2: 22 mmol/L (ref 22–32)
Calcium: 11.6 mg/dL — ABNORMAL HIGH (ref 8.9–10.3)
Chloride: 98 mmol/L (ref 98–111)
Creatinine, Ser: 1.2 mg/dL — ABNORMAL HIGH (ref 0.44–1.00)
GFR, Estimated: 55 mL/min — ABNORMAL LOW (ref >60)
Glucose, Bld: 86 mg/dL (ref 70–99)
Potassium: 5.3 mmol/L — ABNORMAL HIGH (ref 3.5–5.1)
Sodium: 132 mmol/L — ABNORMAL LOW (ref 135–145)
Total Bilirubin: 0.6 mg/dL (ref 0.3–1.2)
Total Protein: 7.8 g/dL (ref 6.5–8.1)

## 2022-03-07 LAB — GLUCOSE, CAPILLARY
Glucose-Capillary: 107 mg/dL — ABNORMAL HIGH (ref 70–99)
Glucose-Capillary: 115 mg/dL — ABNORMAL HIGH (ref 70–99)
Glucose-Capillary: 117 mg/dL — ABNORMAL HIGH (ref 70–99)
Glucose-Capillary: 141 mg/dL — ABNORMAL HIGH (ref 70–99)
Glucose-Capillary: 149 mg/dL — ABNORMAL HIGH (ref 70–99)
Glucose-Capillary: 150 mg/dL — ABNORMAL HIGH (ref 70–99)
Glucose-Capillary: 80 mg/dL (ref 70–99)

## 2022-03-07 LAB — CBC
HCT: 35.7 % — ABNORMAL LOW (ref 36.0–46.0)
Hemoglobin: 11.4 g/dL — ABNORMAL LOW (ref 12.0–15.0)
MCH: 29.2 pg (ref 26.0–34.0)
MCHC: 31.9 g/dL (ref 30.0–36.0)
MCV: 91.3 fL (ref 80.0–100.0)
Platelets: 279 10*3/uL (ref 150–400)
RBC: 3.91 MIL/uL (ref 3.87–5.11)
RDW: 14.5 % (ref 11.5–15.5)
WBC: 9 10*3/uL (ref 4.0–10.5)
nRBC: 0 % (ref 0.0–0.2)

## 2022-03-07 LAB — APTT: aPTT: 57 seconds — ABNORMAL HIGH (ref 24–36)

## 2022-03-07 LAB — HEPARIN LEVEL (UNFRACTIONATED): Heparin Unfractionated: 0.61 IU/mL (ref 0.30–0.70)

## 2022-03-07 LAB — PHOSPHORUS: Phosphorus: 3.8 mg/dL (ref 2.5–4.6)

## 2022-03-07 LAB — MAGNESIUM: Magnesium: 2.2 mg/dL (ref 1.7–2.4)

## 2022-03-07 MED ORDER — APIXABAN 5 MG PO TABS
5.0000 mg | ORAL_TABLET | Freq: Two times a day (BID) | ORAL | Status: DC
  Administered 2022-03-07 – 2022-03-08 (×3): 5 mg
  Filled 2022-03-07 (×3): qty 1

## 2022-03-07 NOTE — Progress Notes (Signed)
ANTICOAGULATION CONSULT NOTE - Follow Up Consult ? ?Pharmacy Consult for heparin ?Indication: pulmonary embolus and CVA ? ?Allergies  ?Allergen Reactions  ? Lisinopril Cough  ? ? ?Patient Measurements: ?Height: 5\' 4"  (162.6 cm) ?Weight: 75.9 kg (167 lb 5.3 oz) ?IBW/kg (Calculated) : 54.7 ?Heparin Dosing Weight: 70 kg ? ?Vital Signs: ?Temp: 98.4 ?F (36.9 ?C) (03/20 2355) ?Temp Source: Oral (03/20 2355) ?BP: 108/69 (03/20 2355) ?Pulse Rate: 65 (03/20 2355) ? ?Labs: ?Recent Labs  ?  03/05/22 ?0602 03/05/22 ?1315 03/06/22 ?0409 03/07/22 ?0040  ?HGB 11.3*  --  11.5* 11.4*  ?HCT 34.8*  --  35.6* 35.7*  ?PLT 271  --  244 279  ?APTT 62* 67* 34 57*  ?HEPARINUNFRC 0.76*  --  0.37 0.61  ?CREATININE  --   --   --  1.20*  ? ? ?Estimated Creatinine Clearance: 56.6 mL/min (A) (by C-G formula based on SCr of 1.2 mg/dL (H)). ? ?Assessment: ?50 yo female presents with right ICH with brain compression and found to have bilateral pulmonary emboli without RHS. PTA the patient is not on anticoagulation. The patient was started on apixaban on 3/11 but transitioned to heparin on 3/15 while awaiting PEG tube placement. Last apixaban dose on 3/14 @ 2100. Pharmacy is consulted to dose heparin.  ?  ?Heparin infusion stopped prior to IR procedure on 3/20, will be restarted 6 hours post-procedure. CBC stable, no reported s/sx bleeding post-procedure. Will restart heparin gtt without bolus. ? ?Heparin drip restarted at 1900 Heparin level returned 0.61, aPTT 57 on 1150 units/hr. Levels not correlating at this time. Per RN no issues with infusion, no s/sx of bleeding at this time. ? ?Goal of Therapy:  ?Heparin level 0.3.0.5 units/ml ?aPTT 66-85 seconds ?Monitor platelets by anticoagulation protocol: Yes ?  ?Plan:  ?Increase heparin infusion to 1250 units/hr (therapeutic target low range of therapeutic, post-op). ?Check aPTT/anti-Xa level in 6 hours and daily while on heparin ?Continue to monitor H&H and platelets ? ?[phone], PharmD Candidate  [phone] ?03/07/2022,3:50 AM ? ? ?

## 2022-03-07 NOTE — Progress Notes (Signed)
? ? ?Referring Physician(s): ?Dr Jerral Ralph ? ?Supervising Physician: Malachy Moan ? ?Patient Status:  [facility] - In-pt ? ?Chief Complaint: ? ?Dysphagia  ? ?Subjective: ? ?G tube placed in IR yesterday ?Site is c/d/I ?No hematoma ?T tacs intact ? ?Allergies: ?Lisinopril ? ?Medications: ?Prior to Admission medications   ?Medication Sig Start Date End Date Taking? Authorizing Provider  ?carvedilol (COREG) 25 MG tablet TAKE 1 TABLET BY MOUTH TWICE DAILY WITH A MEAL ?Patient taking differently: Take 25 mg by mouth 2 (two) times daily with a meal. 01/13/22  Yes [name], MD  ?hydrochlorothiazide (HYDRODIURIL) 25 MG tablet Take 1 tablet by mouth once daily 12/14/21  Yes [name], MD  ? ? ? ?Vital Signs: ?BP 117/72   Pulse 66   Temp 98.4 ?F (36.9 ?C) (Axillary)   Resp 16   Ht 5\' 4"  (1.626 m)   Wt 167 lb 5.3 oz (75.9 kg)   SpO2 100%   BMI 28.72 kg/m?  ? ? ? ?Imaging: ?IR GASTROSTOMY TUBE MOD SED ? ?Result Date: 03/06/2022 ?INDICATION: Malnutrition EXAM: Placement of percutaneous gastrostomy tube using fluoroscopic guidance MEDICATIONS: Ancef 2 gm IV; Antibiotics were administered within 1 hour of the procedure. Glucagon 1 mg IV ANESTHESIA/SEDATION: Moderate (conscious) sedation was employed during this procedure. A total of Versed 1.5 mg and Fentanyl 50 mcg was administered intravenously by the radiology nurse. Total intra-service moderate Sedation Time: 12 minutes. The patient's level of consciousness and vital signs were monitored continuously by radiology nursing throughout the procedure under my direct supervision. CONTRAST:  20 mL-administered into the gastric lumen. FLUOROSCOPY: Radiation Exposure Index (as provided by the fluoroscopic device): 1.2 minutes (4 mGy) COMPLICATIONS: None immediate. PROCEDURE: Informed written consent was obtained from the patient after a thorough discussion of the procedural risks, benefits and alternatives. All questions were addressed. Maximal Sterile Barrier Technique  was utilized including caps, mask, sterile gowns, sterile gloves, sterile drape, hand hygiene and skin antiseptic. A timeout was performed prior to the initiation of the procedure. Review of pre-procedure CT scan demonstrates an adequate window for percutaneous placement of a gastrostomy tube. The patient was placed supine on the exam table. The abdomen was prepped and draped in the standard sterile fashion. After insufflating the stomach with air using the existing nasogastric tube, puncture sites were selected and local analgesia was obtained with 1% lidocaine. Using fluoroscopic guidance, a gastropexy needle was advanced into the stomach and the T-bar suture was released. Entry into the stomach was confirmed with fluoroscopy, aspiration of air, and injection of contrast material. This was repeated with an additional gastropexy suture (for a total of 2 fasteners). At the center of these gastropexy sutures, a dermatotomy was performed. An 18 gauge needle was then passed into the stomach, and position within the gastric lumen again confirmed under fluoroscopy using aspiration of air and injection of contrast material. An Amplatz guidewire was passed through this needle and intraluminal placement was confirmed with fluoroscopy. The needle was removed, and over the guidewire, a 20 French balloon gastrostomy tube with a coaxial 10 mm balloon was advanced into the percutaneous tract. Dilation of the percutaneous tract was then performed using the balloon, followed by advancement of the gastrostomy tube into the gastric lumen. The retention balloon was then inflated with 20 mL of sterile water, and the tube was brought back to the gastric wall. The wire and balloon were removed. The external bumper was brought to the skin. Location of the gastrostomy tube within the stomach was then confirmed  with injection of contrast material, opacifying the gastric lumen. The gastrostomy tube was flushed with sterile water, and  secured to the skin using a dressing. The patient tolerated the procedure well without immediate complication, and was transferred to recovery in stable condition. IMPRESSION: Successful placement of a 20 French percutaneous balloon gastrostomy tube using fluoroscopic guidance. Electronically Signed   By: Olive Bass M.D.   On: 03/06/2022 12:34   ? ?Labs: ? ?CBC: ?Recent Labs  ?  03/04/22 ?0111 03/05/22 ?0602 03/06/22 ?0409 03/07/22 ?0040  ?WBC 7.2 6.6 6.7 9.0  ?HGB 11.1* 11.3* 11.5* 11.4*  ?HCT 34.4* 34.8* 35.6* 35.7*  ?PLT 272 271 244 279  ? ? ?COAGS: ?Recent Labs  ?  02/05/22 ?0915 02/14/22 ?1101 03/01/22 ?1802 03/05/22 ?0602 03/05/22 ?1315 03/06/22 ?0409 03/07/22 ?0040  ?INR 1.2 1.1  --   --   --   --   --   ?APTT 30 34   < > 62* 67* 34 57*  ? < > = values in this interval not displayed.  ? ? ?BMP: ?Recent Labs  ?  02/26/22 ?0026 02/26/22 ?1218 02/27/22 ?0232 02/28/22 ?0306 03/07/22 ?0040  ?NA 131*  --  133* 132* 132*  ?K 4.9  --  4.6 4.6 5.3*  ?CL 101  --  102 101 98  ?CO2 23  --  23 24 22   ?GLUCOSE 180*  --  123* 141* 86  ?BUN 36*  --  34* 39* 66*  ?CALCIUM 11.0* 11.4* 11.5* 11.1* 11.6*  ?CREATININE 0.98  --  0.94 0.98 1.20*  ?GFRNONAA >60  --  >60 >60 55*  ? ? ?LIVER FUNCTION TESTS: ?Recent Labs  ?  02/05/22 ?0915 02/07/22 ?0320 02/19/22 ?04/21/22 03/07/22 ?0040  ?BILITOT 0.7 0.4 0.3 0.6  ?AST 33 31 34 28  ?ALT 26 31 54* 19  ?ALKPHOS 65 48 54 72  ?PROT 7.5 6.3* 6.8 7.8  ?ALBUMIN 3.9 3.1* 2.6* 3.6  ? ? ?Assessment and Plan: ? ?May use G tube now ?Plan for t tac removal 03/16/22 ? ?Electronically Signed: ?03/18/22, PA-C ?03/07/2022, 10:33 AM ? ? ?I spent a total of 15 Minutes at the the patient's bedside AND on the patient's hospital floor or unit, greater than 50% of which was counseling/coordinating care for perc G tube ? ? ? ?  ?

## 2022-03-07 NOTE — Progress Notes (Signed)
?PROGRESS NOTE ? ? ? ?Sarah Myers  WUJ:[id number] DOB: 04/22/1971 DOA: 02/05/2022 ?PCP: [name], MD  ? ? ?Brief Narrative:  ?51 year old with history of hypertension presented to the emergency room with left-sided weakness and slurred speech.  She was found to have dense left hemiplegia and intracranial hemorrhage. ?2/19, admitted to intensive care unit with left-sided dense hemiplegia and slurred speech and found to have right intracranial hemorrhage with brain compression present on admission.  Treated with 3% saline, Cleviprex and intubated.  She failed intubation x2 and underwent tracheostomy on 2/28 by PCCM. ?2/28, tracheostomy. ?3/1 trach collar, off ventilator on 3/4. ?Transferred to medical floor on 3/6.  Cuffless trach on 3/16.  PEG tube placed on 3/20. ?Hospital course complicated by MSSA pneumonia, completed antibiotic therapy.   ? ?Assessment & Plan: ?  ?Acute right basal ganglia hemorrhage, hypertensive emergency: ?CT head on arrival, acute right lentiform hemorrhage with mass effect 5 mm left shift ?CT head 2/26 stable hematoma ?Echocardiogram, essentially normal.  Normal ejection fraction. ?CT head 3/8, expected regression of right ICH. ?CT head 3/12 continued expected interval evolution of intraparenchymal hematoma ?Was Followed by critical care and neurology and currently signed off. ?Patient is on multiple oral medications including Norvasc 10 mg, clonidine 0.1 mg 3 times daily, Coreg 25 mg twice daily, spironolactone 25 mg daily, Cozaar 100 mg daily.  Blood pressures are acceptable. ?Klonopin and Seroquel for agitation and discomfort.  Adequate pain medications. ?3/20, symptoms much controlled and comfortable night after increasing Seroquel 100 mg at night. ? ?Acute respiratory insufficiency secondary to massive intracranial hemorrhage: ?2/28, tracheostomy ?3/1, trach collar. ?3/16, downsized to cuffless #6 trach.  Currently on trach collar with minimum oxygen. ? ?Dysphagia: Secondary  to massive stroke.  Patient had Dobbhoff tube in place.  No hope of eating adequate to keep up with caloric and nutritional intake in the near future.  Discussed with family and underwent PEG tube placement on 3/20 by IR.  ?Discontinue Dobbhoff tube.  ?Start feeding and medications through the PEG tube.  ? ?Bilateral pulmonary embolism: Detected on CT angiogram 3/7.  Patient was initially on heparin, then transitioned to Eliquis.  Started on heparin for periprocedure. ?Cleared for anticoagulation by neurology on 3/8, Dr. Roda Shutters.  We will change to Eliquis today. ? ?Hypercalcemia: Probably secondary to dehydration/calcium mobilization from immobility.  PTH is normal.  Not on supplements.  Will monitor. ? ?Goal of care:  ?Critically sick.  High risk of rehemorrhage on anticoagulation.  Palliative care, family meeting 3/16.  She is very high risk of decompensation, she is very high risk of getting another bleeding. ?Also with pain and agitation, currently trying to work around with pain medications and Seroquel. ?Full scope of treatment. ?Trach and PEG and nursing home placement. ?3/21 patient's daughter [name] at the bedside.  Given detailed updates.  Given updates about brain hemorrhage, massive pulmonary embolism on blood thinners and challenging treatment options.  All questions were answered.  Since patient becomes very uncomfortable at night, we decided to keep on current doses of medications including Seroquel and pain medications. ? ? ?DVT prophylaxis: SCD's Start: 02/05/22 0846.   ?apixaban (ELIQUIS) tablet 5 mg  ? ?Code Status: Full code ?Family Communication: Daughter [name] at the bedside. ?Disposition Plan: Status is: Inpatient ?Remains inpatient appropriate because: Unsafe disposition plan.  Needs long-term placement. ?  ? ? ?Consultants:  ?Neurology ?Neurosurgery ?Critical care ?Palliative care ? ?Procedures:  ?Tracheostomy 2/28 ?PEG tube 3/20 ? ?Antimicrobials:  ?Cefazolin 3/8---  3/16. ? ? ?  Subjective: ? ?Patient seen and examined.  Her daughter stayed with her overnight. ?She had episodes of mucous plugging and hypoxemia yesterday that was corrected with suction.  No other overnight events.  Mostly sleepy today.  Daughter tells me that she responds to her, she nods to her questions. ? ?Objective: ?Vitals:  ? 03/07/22 0750 03/07/22 0847 03/07/22 1125 03/07/22 1209  ?BP: 135/71 117/72  112/64  ?Pulse: 81 66 64 72  ?Resp: 16 14 16 16   ?Temp:  98.5 ?F (36.9 ?C)  98.1 ?F (36.7 ?C)  ?TempSrc:    Axillary  ?SpO2: 100% 100% 100% 100%  ?Weight:      ?Height:      ? ? ?Intake/Output Summary (Last 24 hours) at 03/07/2022 1254 ?Last data filed at 03/07/2022 1000 ?Gross per 24 hour  ?Intake 186.09 ml  ?Output 700 ml  ?Net -513.91 ml  ? ? ?Filed Weights  ? 03/04/22 0318 03/05/22 0500 03/06/22 0500  ?Weight: 74.3 kg 76 kg 75.9 kg  ? ? ?Examination: ? ?General exam: Chronically sick looking, frail and debilitated.  Mostly sleepy. Does not move left side. ?Patient can follow simple commands on the right side when she is awake.  Intermittently anxious and agitated. ?Respiratory system: Clear to auscultation. Respiratory effort normal.  Conducted upper airway sounds.  On trach collar.  Cardiovascular system: S1 & S2 heard, RRR.  ?Gastrointestinal system: Soft.  Nontender.  Bowel sound present.  PEG tube clean and dry. ? ? ? ?Data Reviewed: I have personally reviewed following labs and imaging studies ? ?CBC: ?Recent Labs  ?Lab 03/03/22 ?0533 03/04/22 ?0111 03/05/22 ?0602 03/06/22 ?0409 03/07/22 ?0040  ?WBC 6.8 7.2 6.6 6.7 9.0  ?HGB 11.9* 11.1* 11.3* 11.5* 11.4*  ?HCT 35.8* 34.4* 34.8* 35.6* 35.7*  ?MCV 88.4 90.1 89.2 90.6 91.3  ?PLT 269 272 271 244 279  ? ? ?Basic Metabolic Panel: ?Recent Labs  ?Lab 03/07/22 ?0040  ?NA 132*  ?K 5.3*  ?CL 98  ?CO2 22  ?GLUCOSE 86  ?BUN 66*  ?CREATININE 1.20*  ?CALCIUM 11.6*  ?MG 2.2  ?PHOS 3.8  ? ? ?GFR: ?Estimated Creatinine Clearance: 56.6 mL/min (A) (by C-G formula based on  SCr of 1.2 mg/dL (H)). ?Liver Function Tests: ?Recent Labs  ?Lab 03/07/22 ?0040  ?AST 28  ?ALT 19  ?ALKPHOS 72  ?BILITOT 0.6  ?PROT 7.8  ?ALBUMIN 3.6  ? ?No results for input(s): LIPASE, AMYLASE in the last 168 hours. ?No results for input(s): AMMONIA in the last 168 hours. ?Coagulation Profile: ?No results for input(s): INR, PROTIME in the last 168 hours. ?Cardiac Enzymes: ?No results for input(s): CKTOTAL, CKMB, CKMBINDEX, TROPONINI in the last 168 hours. ?BNP (last 3 results) ?No results for input(s): PROBNP in the last 8760 hours. ?HbA1C: ?No results for input(s): HGBA1C in the last 72 hours. ?CBG: ?Recent Labs  ?Lab 03/06/22 ?1945 03/06/22 ?2348 03/07/22 ?0330 03/07/22 ?0825 03/07/22 ?1213  ?GLUCAP 126* 91 80 107* 117*  ? ? ?Lipid Profile: ?No results for input(s): CHOL, HDL, LDLCALC, TRIG, CHOLHDL, LDLDIRECT in the last 72 hours. ?Thyroid Function Tests: ?No results for input(s): TSH, T4TOTAL, FREET4, T3FREE, THYROIDAB in the last 72 hours. ?Anemia Panel: ?No results for input(s): VITAMINB12, FOLATE, FERRITIN, TIBC, IRON, RETICCTPCT in the last 72 hours. ?Sepsis Labs: ?No results for input(s): PROCALCITON, LATICACIDVEN in the last 168 hours. ? ?Recent Results (from the past 240 hour(s))  ?Urine Culture     Status: None  ? Collection Time: 02/26/22  7:02 AM  ? Specimen: Urine,  Catheterized  ?Result Value Ref Range Status  ? Specimen Description URINE, CATHETERIZED  Final  ? Special Requests NONE  Final  ? Culture   Final  ?  NO GROWTH ?Performed at [facility], [address], [place], [place] [address] ?  ? Report Status 02/27/2022 FINAL  Final  ? ?  ? ? ? ? ? ?Radiology Studies: ?IR GASTROSTOMY TUBE MOD SED ? ?Result Date: 03/06/2022 ?INDICATION: Malnutrition EXAM: Placement of percutaneous gastrostomy tube using fluoroscopic guidance MEDICATIONS: Ancef 2 gm IV; Antibiotics were administered within 1 hour of the procedure. Glucagon 1 mg IV ANESTHESIA/SEDATION: Moderate (conscious) sedation was employed  during this procedure. A total of Versed 1.5 mg and Fentanyl 50 mcg was administered intravenously by the radiology nurse. Total intra-service moderate Sedation Time: 12 minutes. The patient's level of consciousness and vital si

## 2022-03-07 NOTE — Progress Notes (Signed)
Physical Therapy Treatment ?Patient Details ?Name: Sarah Myers ?MRN: [id number] ?DOB: 1971/03/20 ?Today's Date: 03/07/2022 ? ? ?History of Present Illness 51 y.o. female presents to [facility] hospital on 02/05/2022 with L hemiplegia and dysarthria. 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 Pt intubated 2/19. Attempted extubation 2/23 and 2/26, requiring reintubation same date. Tracheostomy 02/14/2022. PMH includes uncontrolled HTN ? ?  ?PT Comments  ? ? Pt continues to have significant impairments with strength, balance, cognition, and lt neglect resulting in poor mobility. Progress very slow. Continue to recommend SNF at DC.   ?Recommendations for follow up therapy are one component of a multi-disciplinary discharge planning process, led by the attending physician.  Recommendations may be updated based on patient status, additional functional criteria and insurance authorization. ? ?Follow Up Recommendations ? Skilled nursing-short term rehab (<3 hours/day) ?  ?  ?Assistance Recommended at Discharge Frequent or constant Supervision/Assistance  ?Patient can return home with the following Two people to help with walking and/or transfers;Two people to help with bathing/dressing/bathroom;Assistance with cooking/housework;Assistance with feeding;Direct supervision/assist for financial management;Direct supervision/assist for medications management;Help with stairs or ramp for entrance;Assist for transportation ?  ?Equipment Recommendations ? Wheelchair (measurements PT);Wheelchair cushion (measurements PT);Hospital bed;Other (comment) (hoyer lift)  ?  ?Recommendations for Other Services   ? ? ?  ?Precautions / Restrictions Precautions ?Precautions: Fall ?Precaution Comments: L neglect, PEG ?Restrictions ?Weight Bearing Restrictions: No  ?  ? ?Mobility ? Bed Mobility ?Overal bed mobility: Needs Assistance ?Bed Mobility: Supine to Sit, Sit to Supine, Rolling ?Rolling: +2 for physical  assistance, Total assist ?  ?Supine to sit: Total assist, +2 for physical assistance, +2 for safety/equipment, Max assist ?Sit to supine: Total assist, +2 for physical assistance, +2 for safety/equipment, Max assist ?  ?General bed mobility comments: Assist for all aspects ?  ? ?Transfers ?  ?  ?  ?  ?  ?  ?  ?  ?  ?  ?  ? ?Ambulation/Gait ?  ?  ?  ?  ?  ?  ?  ?  ? ? ?Stairs ?  ?  ?  ?  ?  ? ? ?Wheelchair Mobility ?  ? ?Modified Rankin (Stroke Patients Only) ?Modified Rankin (Stroke Patients Only) ?Pre-Morbid Rankin Score: No symptoms ?Modified Rankin: Severe disability ? ? ?  ?Balance Overall balance assessment: Needs assistance ?Sitting-balance support: Feet supported, Bilateral upper extremity supported ?Sitting balance-Leahy Scale: Poor ?Sitting balance - Comments: Sat EOB x 15 minutes with mod assist primarily with occasional max assist. Pt with tendency to push with RUE. Work on propping on rt elbow. Also placed LUE into a weight bearing position. Worked on wt shifting. ?  ?  ?  ?  ?  ?  ?  ?  ?  ?  ?  ?  ?  ?  ?  ?  ? ?  ?Cognition Arousal/Alertness: Awake/alert, Lethargic ?Behavior During Therapy: Flat affect ?Overall Cognitive Status: Difficult to assess ?Area of Impairment: Attention, Following commands, Safety/judgement ?  ?  ?  ?  ?  ?  ?  ?  ?  ?Current Attention Level: Focused ?  ?Following Commands: Follows one step commands inconsistently, Follows one step commands with increased time ?Safety/Judgement: Decreased awareness of safety ?  ?Problem Solving: Slow processing ?General Comments: difficult to assess due to impairments listed below. Pt not shaking head yes/no this date. follow ~10% of commands. Eyes open 90% of session with R gaze; not tracking. Waved "bye"  to therapist at the end of the session. ?  ?  ? ?  ?Exercises   ? ?  ?General Comments   ?  ?  ? ?Pertinent Vitals/Pain Pain Assessment ?Pain Assessment: Faces ?Faces Pain Scale: Hurts a little bit ?Pain Location: abdoment, PEG site ?Pain  Descriptors / Indicators: Grimacing ?Pain Intervention(s): Monitored during session, Repositioned  ? ? ?Home Living   ?  ?  ?  ?  ?  ?  ?  ?  ?  ?   ?  ?Prior Function    ?  ?  ?   ? ?PT Goals (current goals can now be found in the care plan section) Acute Rehab PT Goals ?PT Goal Formulation: Patient unable to participate in goal setting ?Time For Goal Achievement: 03/21/22 ?Potential to Achieve Goals: Fair ?Progress towards PT goals: Goals downgraded-see care plan ? ?  ?Frequency ? ? ? Min 3X/week ? ? ? ?  ?PT Plan Current plan remains appropriate  ? ? ?Co-evaluation   ?  ?  ?  ?  ? ?  ?AM-PAC PT "6 Clicks" Mobility   ?Outcome Measure ? Help needed turning from your back to your side while in a flat bed without using bedrails?: Total ?Help needed moving from lying on your back to sitting on the side of a flat bed without using bedrails?: Total ?Help needed moving to and from a bed to a chair (including a wheelchair)?: Total ?Help needed standing up from a chair using your arms (e.g., wheelchair or bedside chair)?: Total ?Help needed to walk in hospital room?: Total ?Help needed climbing 3-5 steps with a railing? : Total ?6 Click Score: 6 ? ?  ?End of Session Equipment Utilized During Treatment: Oxygen ?Activity Tolerance: Patient limited by fatigue;Patient limited by lethargy ?Patient left: in bed;with call bell/phone within reach;with bed alarm set;with family/visitor present ?  ?PT Visit Diagnosis: Other abnormalities of gait and mobility (R26.89);Hemiplegia and hemiparesis ?Hemiplegia - Right/Left: Left ?Hemiplegia - dominant/non-dominant: Dominant ?Hemiplegia - caused by: Nontraumatic intracerebral hemorrhage ?  ? ? ?Time: 8003-4917 ?PT Time Calculation (min) (ACUTE ONLY): 24 min ? ?Charges:  $Therapeutic Activity: 23-37 mins          ?          ? ?[facility] PT ?Acute Rehabilitation Services ?Pager [phone] ?Office [phone] ? ? ? ?[name] [facility] ?03/07/2022, 1:43 PM ? ?

## 2022-03-07 NOTE — Progress Notes (Addendum)
Speech Language Pathology Treatment: Nada Boozer Speaking valve  ?Patient Details ?Name: Sarah Myers ?MRN: [id number] ?DOB: October 28, 1971 ?Today's Date: 03/07/2022 ?Time: 3462-1947 ?SLP Time Calculation (min) (ACUTE ONLY): 11 min ? ?Assessment / Plan / Recommendation ?Clinical Impression ? Pt sleepy, opens eyes on request seen for use of upper airway for respiration and vocalization. She tolerated the valve well without evidence of back pressure and would not vocalize when requested multiple times and no spontaneous utterances. She will gesture with head and hand to respond to questions- uncertain of her ability to verbalize any of her needs at this point (too lethargic, lack of respiratory support ??, possible lang disorder but stroke is right brain). Pt can use PMV with full supervision with all therapies and Rn. Her trach is cuffless and is appearing to tolerate well. She received PEG the other day. Therapist offered strawberry icee but she declined. She did not follow commands for effortful swallow exercises.   ?HPI HPI: 51 y/o female came to the [facility] ER on 2/19 with left sided weakness and slurred speech.  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 Intubated 2/19. Trach changed from #6 cuffed to #4 cuffless on 3/16. Palliative NP met with patient's three daughters and decision was reached that they would like to see how PO trials go and if not tolerated they are open to PEG; SNF at discharge is anticipated by all parties. ?  ?   ?SLP Plan ? Continue with current plan of care ? ?  ?  ?Recommendations for follow up therapy are one component of a multi-disciplinary discharge planning process, led by the attending physician.  Recommendations may be updated based on patient status, additional functional criteria and insurance authorization. ?  ? ?Recommendations  ?   ?   ? Patient may use Passy-Muir Speech Valve: During all therapies with supervision ?PMSV Supervision: Full  ?    ? ? ? ? Oral Care Recommendations: Oral care QID ?Follow Up Recommendations: Skilled nursing-short term rehab (<3 hours/day) ?Assistance recommended at discharge: Frequent or constant Supervision/Assistance ?SLP Visit Diagnosis: Aphonia (R49.1) ?Plan: Continue with current plan of care ? ? ? ? ?  ?  ? ? ?[name] ? ?03/07/2022, 11:17 AM ?

## 2022-03-07 NOTE — Progress Notes (Signed)
Occupational Therapy Treatment ?Patient Details ?Name: Sarah Myers ?MRN: [id number] ?DOB: 1971/11/29 ?Today's Date: 03/07/2022 ? ? ?History of present illness 51 y.o. female presents to [facility] hospital on 02/05/2022 with L hemiplegia and dysarthria. 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 Pt intubated 2/19. Attempted extubation 2/23 and 2/26, requiring reintubation same date. Tracheostomy 02/14/2022. PMH includes uncontrolled HTN ?  ?OT comments ? Patient received in room in supine with family present.  PROM to LUE to address tone and positioning in bed.  Grooming attempted with RUE with patient requiring hand over hand and limited participation.  AAROM exercises performed.  Patient was able to follow one step commands but unable to follow 2 with patient's eye shut most of visit.  Acute OT to continue to follow.   ? ?Recommendations for follow up therapy are one component of a multi-disciplinary discharge planning process, led by the attending physician.  Recommendations may be updated based on patient status, additional functional criteria and insurance authorization. ?   ?Follow Up Recommendations ? Skilled nursing-short term rehab (<3 hours/day)  ?  ?Assistance Recommended at Discharge Frequent or constant Supervision/Assistance  ?Patient can return home with the following ? Two people to help with walking and/or transfers;Two people to help with bathing/dressing/bathroom;Assistance with feeding;Help with stairs or ramp for entrance;Assist for transportation;Assistance with cooking/housework;Direct supervision/assist for medications management ?  ?Equipment Recommendations ? Hospital bed;Wheelchair cushion (measurements OT);Wheelchair (measurements OT)  ?  ?Recommendations for Other Services   ? ?  ?Precautions / Restrictions Precautions ?Precautions: Fall ?Precaution Comments: L neglect, PEG ?Restrictions ?Weight Bearing Restrictions: No  ? ? ?  ? ?Mobility Bed Mobility ?  ?  ?   ?  ?  ?  ?  ?  ?  ? ?Transfers ?  ?  ?  ?  ?  ?  ?  ?  ?  ?  ?  ?  ?Balance   ?  ?  ?  ?  ?  ?  ?  ?  ?  ?  ?  ?  ?  ?  ?  ?  ?  ?  ?   ? ?ADL either performed or assessed with clinical judgement  ? ?ADL Overall ADL's : Needs assistance/impaired ?  ?  ?Grooming: Therapist, nutritional;Moderate assistance;Bed level ?Grooming Details (indicate cue type and reason): hand over hand assist to perform ?  ?  ?  ?  ?  ?  ?  ?  ?  ?  ?  ?  ?  ?  ?  ?General ADL Comments: limited participation due to lethargic ?  ? ?Extremity/Trunk Assessment Upper Extremity Assessment ?RUE Coordination: decreased fine motor;decreased gross motor ?LUE Deficits / Details: flaccid throughout LUE, with incresed swelling. ?LUE Sensation: decreased light touch ?LUE Coordination: decreased fine motor;decreased gross motor ?  ?  ?  ?  ?  ? ?Vision   ?  ?  ?Perception   ?  ?Praxis   ?  ? ?Cognition   ?  ?  ?  ?  ?  ?  ?  ?  ?  ?  ?  ?  ?  ?  ?  ?  ?  ?  ?  ?  ?  ?   ?Exercises Exercises: General Upper Extremity ?General Exercises - Upper Extremity ?Shoulder Flexion: PROM, Left, AROM, Right ?Shoulder Extension: PROM, Left, AAROM, Right ?Shoulder ABduction: PROM, Left ?Shoulder ADduction: PROM, Left ?Elbow Flexion: PROM, Left, AAROM, Right ?Elbow  Extension: PROM, Left, AAROM, Right ?Wrist Flexion: PROM, Right ?Wrist Extension: PROM, Left ?Digit Composite Flexion: PROM, Left ?Composite Extension: PROM, Left ? ?  ?Shoulder Instructions   ? ? ?  ?General Comments    ? ? ?Pertinent Vitals/ Pain         ? ?Home Living   ?  ?  ?  ?  ?  ?  ?  ?  ?  ?  ?  ?  ?  ?  ?  ?  ?  ?  ? ?  ?Prior Functioning/Environment    ?  ?  ?  ?   ? ?Frequency ? Min 2X/week  ? ? ? ? ?  ?Progress Toward Goals ? ?OT Goals(current goals can now be found in the care plan section) ? Progress towards OT goals: Not progressing toward goals - comment (due to lethargic) ? ?Acute Rehab OT Goals ?OT Goal Formulation: Patient unable to participate in goal setting ?Time For Goal Achievement:  03/15/22 ?Potential to Achieve Goals: Fair ?ADL Goals ?Pt Will Perform Grooming: with min assist;sitting ?Additional ADL Goal #1: Patient will perfrom supine to sit with Mod A to increase independence with toileting. ?Additional ADL Goal #2: Patient will follow two step commands 50% of the time with Min VC's  ?Plan Discharge plan remains appropriate   ? ?Co-evaluation ? ? ?   ?  ?  ?  ?  ? ?  ?AM-PAC OT "6 Clicks" Daily Activity     ?Outcome Measure ? ? Help from another person eating meals?: Total ?Help from another person taking care of personal grooming?: A Lot ?Help from another person toileting, which includes using toliet, bedpan, or urinal?: Total ?Help from another person bathing (including washing, rinsing, drying)?: A Lot ?Help from another person to put on and taking off regular upper body clothing?: A Lot ?Help from another person to put on and taking off regular lower body clothing?: Total ?6 Click Score: 9 ? ?  ?End of Session Equipment Utilized During Treatment: Oxygen ? ?OT Visit Diagnosis: Muscle weakness (generalized) (M62.81);Other symptoms and signs involving cognitive function;Hemiplegia and hemiparesis ?Hemiplegia - Right/Left: Left ?Hemiplegia - dominant/non-dominant: Dominant ?Hemiplegia - caused by: Nontraumatic intracerebral hemorrhage ?  ?Activity Tolerance Patient limited by lethargy ?  ?Patient Left in bed;with call bell/phone within reach;with bed alarm set;with family/visitor present;with restraints reapplied ?  ?Nurse Communication Mobility status ?  ? ?   ? ?Time: 6808-8110 ?OT Time Calculation (min): 13 min ? ?Charges: OT General Charges ?$OT Visit: 1 Visit ?OT Treatments ?$Neuromuscular Re-education: 8-22 mins ? ?Sarah Myers, OTA ?Acute Rehabilitation Services  ?Pager [phone] ?Office [phone] ? ? ?Sarah Myers ?03/07/2022, 1:57 PM ?

## 2022-03-08 ENCOUNTER — Other Ambulatory Visit (HOSPITAL_COMMUNITY): Payer: Self-pay

## 2022-03-08 ENCOUNTER — Inpatient Hospital Stay (HOSPITAL_COMMUNITY): Payer: 59

## 2022-03-08 DIAGNOSIS — I69391 Dysphagia following cerebral infarction: Secondary | ICD-10-CM

## 2022-03-08 DIAGNOSIS — R1310 Dysphagia, unspecified: Secondary | ICD-10-CM

## 2022-03-08 DIAGNOSIS — I619 Nontraumatic intracerebral hemorrhage, unspecified: Secondary | ICD-10-CM

## 2022-03-08 DIAGNOSIS — Z7189 Other specified counseling: Secondary | ICD-10-CM

## 2022-03-08 LAB — BASIC METABOLIC PANEL
Anion gap: 9 (ref 5–15)
BUN: 75 mg/dL — ABNORMAL HIGH (ref 6–20)
CO2: 27 mmol/L (ref 22–32)
Calcium: 12.2 mg/dL — ABNORMAL HIGH (ref 8.9–10.3)
Chloride: 98 mmol/L (ref 98–111)
Creatinine, Ser: 1.23 mg/dL — ABNORMAL HIGH (ref 0.44–1.00)
GFR, Estimated: 54 mL/min — ABNORMAL LOW (ref >60)
Glucose, Bld: 107 mg/dL — ABNORMAL HIGH (ref 70–99)
Potassium: 5.5 mmol/L — ABNORMAL HIGH (ref 3.5–5.1)
Sodium: 134 mmol/L — ABNORMAL LOW (ref 135–145)

## 2022-03-08 LAB — CBC
HCT: 35.4 % — ABNORMAL LOW (ref 36.0–46.0)
Hemoglobin: 11.5 g/dL — ABNORMAL LOW (ref 12.0–15.0)
MCH: 29.4 pg (ref 26.0–34.0)
MCHC: 32.5 g/dL (ref 30.0–36.0)
MCV: 90.5 fL (ref 80.0–100.0)
Platelets: 236 10*3/uL (ref 150–400)
RBC: 3.91 MIL/uL (ref 3.87–5.11)
RDW: 14.5 % (ref 11.5–15.5)
WBC: 7 10*3/uL (ref 4.0–10.5)
nRBC: 0 % (ref 0.0–0.2)

## 2022-03-08 LAB — GLUCOSE, CAPILLARY
Glucose-Capillary: 161 mg/dL — ABNORMAL HIGH (ref 70–99)
Glucose-Capillary: 139 mg/dL — ABNORMAL HIGH (ref 70–99)
Glucose-Capillary: 114 mg/dL — ABNORMAL HIGH (ref 70–99)
Glucose-Capillary: 122 mg/dL — ABNORMAL HIGH (ref 70–99)
Glucose-Capillary: 93 mg/dL (ref 70–99)

## 2022-03-08 LAB — PTH-RELATED PEPTIDE: PTH-related peptide: <2 pmol/L

## 2022-03-08 MED ORDER — HEPARIN (PORCINE) 25000 UT/250ML-% IV SOLN
1150.0000 [IU]/h | INTRAVENOUS | Status: DC
  Administered 2022-03-08: 1150 [IU]/h via INTRAVENOUS
  Filled 2022-03-08: qty 250

## 2022-03-08 MED ORDER — CLONAZEPAM 0.5 MG PO TABS
1.0000 mg | ORAL_TABLET | Freq: Two times a day (BID) | ORAL | Status: DC
  Administered 2022-03-08 – 2022-03-09 (×3): 1 mg
  Filled 2022-03-08 (×2): qty 2

## 2022-03-08 MED ORDER — AMANTADINE HCL 50 MG/5ML PO SOLN
50.0000 mg | Freq: Two times a day (BID) | ORAL | Status: DC
  Administered 2022-03-08 – 2022-03-21 (×27): 50 mg
  Filled 2022-03-08 (×31): qty 5

## 2022-03-08 MED ORDER — QUETIAPINE FUMARATE 50 MG PO TABS
75.0000 mg | ORAL_TABLET | Freq: Every day | ORAL | Status: DC
  Administered 2022-03-09: 75 mg
  Filled 2022-03-08: qty 1

## 2022-03-08 MED ORDER — METHOCARBAMOL 500 MG PO TABS
500.0000 mg | ORAL_TABLET | Freq: Three times a day (TID) | ORAL | Status: DC
  Administered 2022-03-08 – 2022-03-21 (×42): 500 mg
  Filled 2022-03-08 (×42): qty 1

## 2022-03-08 NOTE — Progress Notes (Addendum)
?Progress Note ? ? ?Patient: Sarah Myers [id number] DOB: Jun 12, 1971 DOA: 02/05/2022     31 ?DOS: the patient was seen and examined on 03/08/2022 ?  ?Brief hospital course: ?51 year old with history of hypertension presented to the emergency room with left-sided weakness and slurred speech.  She was found to have dense left hemiplegia and intracranial hemorrhage. ?2/19, admitted to intensive care unit with left-sided dense hemiplegia and slurred speech and found to have right intracranial hemorrhage with brain compression present on admission.  Treated with 3% saline, Cleviprex and intubated.  She failed intubation x2 and underwent tracheostomy on 2/28 by PCCM. ?2/28, tracheostomy. ?3/1 trach collar, off ventilator on 3/4. ?Transferred to medical floor on 3/6.  Cuffless trach on 3/16.  PEG tube placed on 3/20. ?Hospital course complicated by MSSA pneumonia, completed antibiotic therapy.   ?  ? 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  Normal ejection fraction. ?CT head 3/8, expected regression of right ICH. ?CT head 3/12 continued expected interval evolution of intraparenchymal hematoma ?Continue Klonopin and Seroquel for sequelae of brain injury.  Reports of oversedation therefore will slowly taper down Klonopin and see if can be discontinued.  We will also decrease Seroquel dose as of 3/22.  Sometimes brain injury patients respond better to Zyprexa so may need to consider this ?Begin Robaxin for hypertonicity ?Add Symmetrel to see if can stimulate patient to awake and more consistently ? 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 ?Tracheostomy dependent ([place]) ?2/28, tracheostomy ?3/1, trach collar. ?3/16, downsized to  cuffless #6 trach.  Currently on trach collar with minimum oxygen. ? ?Dysphagia due to recent stroke/moderate PCM ?Feedings initially via cortrack tube ?And gastrostomy tube placed by IR on 3/20 ?Has subsequently developed abdominal distention without emesis as of 3/22. ?Two-view abdomen pending; discussed with attending and will hold all medications and tube feedings until x-ray has returned.  Given history of PE we will change Eliquis to full dose Lovenox with pharmacy dosing until better clarified. ? ? ?Bilateral pulmonary embolism (HCC) ?Detected on CT angiogram 3/7.  ?Cleared for anticoagulation by neurology/Dr.Xu on 3/8  ?Patient was initially on heparin, then transitioned to Eliquis.   ? ? ?Malignant hypertension ?Presented with hypertensive emergency ?Continue Norvasc, carvedilol, Cozaar and Catapres ?Has as needed IV hydralazine as well as labetalol available ? ?Counseling regarding goals of care ?High risk of rehemorrhage on anticoagulation.   ?Palliative care, family meeting 3/16.  She is very high risk of decompensation, she is very high risk of getting another bleeding.  Family requested full scope of treatment ?3/21 patient's daughter [name] at the bedside.  Given updates about brain hemorrhage, massive pulmonary embolism on blood thinners and challenging treatment options previous attending physician.  All questions were answered.  Since patient becomes very uncomfortable at night, we decided to keep on current doses of medications including Seroquel and pain medications. ? ?Iron deficiency anemia ?Current hemoglobin stable at 11.5 ?Consider regular per tube iron replacement ? 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 Subcutaneous fat may be visible but bone, tendon or muscle are NOT exposed. (Active)  ?Date First Assessed/Time First Assessed: 02/26/22 0443   Location: Cervical  Location Orientation: Left  Staging: Stage 3 -  Full  thickness  tissue loss. Subcutaneous fat may be visible but bone, tendon or muscle are NOT exposed.  Present on Admission: No  ?  ?Assessments 02/26/2022  4:44 AM 03/08/2022  8:41 AM  ?Dressing Type -- Foam - Lift dressing to assess site every shift  ?Dressing Clean, Dry, Intact Changed;Clean, Dry, Intact  ?Dressing Change Frequency -- Every 3 days  ?Peri-wound Assessment Erythema (blanchable) Erythema (blanchable)  ?Margins Unattached edges (unapproximated) Unattached edges (unapproximated)  ?Drainage Amount None --  ?   ?No Linked orders to display  ? ? ? ? ? ?  ? ?Subjective:  ?Patient unresponsive and unable to respond verbally ? ?Physical Exam: ?Vitals:  ? 03/08/22 0812 03/08/22 0835 03/08/22 1045 03/08/22 1119  ?BP:  135/73  102/64  ?Pulse: 64 64 67 66  ?Resp: 20  20 20   ?Temp:    98.4 ?F (36.9 ?C)  ?TempSrc:    Axillary  ?SpO2:   100%   ?Weight:      ?Height:      ? ?Constitutional: Unresponsive today.  ?Respiratory: Anterior lung sounds clear to auscultation bilaterally, Normal respiratory effort. No accessory muscle use.  Room air ?Cardiovascular: Regular rate and rhythm, No extremity edema.  Normotensive ?Abdomen: no tenderness when palpating directly over PEG site, Bowel sounds positive.  LBM 3/20 ?Musculoskeletal: no clubbing / cyanosis. No joint deformity upper and lower extremities. No contractures.  Generalized hypertonic muscle tone.  ?Neurologic: CN 2-12 appears to be grossly intact. Sensation appears to be grossly intact, unable to accurately test strength since patient unable to follow simple commands.  As noted patient has significant extremity hypertonicity ?Psychiatric: Unresponsive and unable to effectively evaluate ? ?Data Reviewed: ? ?CBC reviewed for today ? ?Family Communication:  ?No family at bedside ? ?Disposition: ?Remains inpatient appropriate because:  ?Requires skilled nursing level of care due to tracheostomy tube, unresponsive state and ongoing need for tube feedings.  She is custodial  care. ? ?Planned Discharge Destination:  ?Skilled nursing facility ? ?Medically stable ?Yes ? ?COVID vaccination status:  ?unknown ? ?Consultants: ?Neurosurgery ?PCCM ?Palliative medicine ?Internal radiology ?Procedures: ?Intubation ?Echocardiogram ?Follow-up echocardiogram with bubble study ?Cortrack tube ?Bedside tracheostomy tube placement ?PEG tube placement ?Antibiotics: ?IV vancomycin x1 dose on 2/23 ?Cefepime 2/23 through 3/1 ?Azithromycin 3/7 ?Cefepime 3/7, 3/8 ?Vancomycin 3/7 ?Cefazolin 3/8 through 3/16 ?Fluconazole 3/19 ?Cefazolin 3/20 ? ? ? ? ?Time spent: 45 minutes ? ?Author: ?Erin Hearing, NP ?03/08/2022 11:56 AM ? ?For on call review [email or web address].  ?

## 2022-03-08 NOTE — Plan of Care (Signed)
?  Problem: Education: ?Goal: Knowledge of disease or condition will improve ?03/08/2022 1707 by Cleophas Dunker, RN ?Outcome: Progressing ? ? ?  ?

## 2022-03-08 NOTE — Assessment & Plan Note (Signed)
High risk of rehemorrhage on anticoagulation.   ?Palliative care, family meeting 3/16.  She is very high risk of decompensation, she is very high risk of getting another bleeding.  Family requested full scope of treatment ?3/21 patient's daughter Sarah Myers at the bedside.  Given updates about brain hemorrhage, massive pulmonary embolism on blood thinners and challenging treatment options previous attending physician.  All questions were answered.  Since patient becomes very uncomfortable at night, we decided to keep on current doses of medications including Seroquel and pain medications. ?

## 2022-03-08 NOTE — Plan of Care (Signed)
?  Problem: Education: ?Goal: Knowledge of disease or condition will improve ?03/08/2022 1706 by Sherlynn Carbon, RN ?Outcome: Progressing ?03/08/2022 1705 by Sherlynn Carbon, RN ?Outcome: Progressing ?  ?

## 2022-03-08 NOTE — Assessment & Plan Note (Addendum)
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 Subcutaneous fat may be visible but bone, tendon or muscle are NOT exposed. (Active)  ?Date First Assessed/Time First Assessed: 02/26/22 0443   Location: Cervical  Location Orientation: Left  Staging: Stage 3 -  Full thickness tissue loss. Subcutaneous fat may be visible but bone, tendon or muscle are NOT exposed.  Present on Admission: No  ?  ?Assessments 02/26/2022  4:44 AM 03/09/2022  8:00 AM  ?Dressing Type -- Foam - Lift dressing to assess site every shift  ?Dressing Clean, Dry, Intact Reinforced  ?Dressing Change Frequency -- Every 3 days  ?Peri-wound Assessment Erythema (blanchable) --  ?Margins Unattached edges (unapproximated) Unattached edges (unapproximated)  ?Drainage Amount None --  ?   ?No Linked orders to display  ? ?

## 2022-03-08 NOTE — Assessment & Plan Note (Addendum)
CT head on arrival, acute right lentiform hemorrhage with mass effect 5 mm left shift--CT head 2/26 stable hematoma ?CT head 3/8, expected regression of right ICH. ?Continue Klonopin and Zyprexa for brain injury sequela; continue Robaxin for hypertonicity and Symmetrel for brain injury sequela ?SLP evaluated patient today and recommended to diet with thin liquids under supervision.  Recommends PMSV as tolerated ?

## 2022-03-08 NOTE — Plan of Care (Signed)
  Problem: Education: Goal: Knowledge of disease or condition will improve Outcome: Progressing   

## 2022-03-08 NOTE — Progress Notes (Signed)
Patient added to DTP list for discharge planning. ? ?Patient has active insurance benefits through [employer] - subscriber ID [id number]. ? ?Madilyn Fireman, MSW, LCSW ?Transitions of Care  Clinical Social Worker II ?[phone] ? ?

## 2022-03-08 NOTE — Assessment & Plan Note (Addendum)
Respiratory status stable but continues to require trach due to degree of secretions ?SLP initiating PMSV trial ?3/30 noted to be sleeping with PMSV in place without any respiratory difficulty ?

## 2022-03-08 NOTE — Assessment & Plan Note (Addendum)
2/28, tracheostomy ?3/1, trach collar. ?3/16, downsized to cuffless #4 trach.  Currently on trach collar with minimum oxygen. ?3/30 patient noted to be asleep with PMSV in place and tolerating without any respiratory distress ?

## 2022-03-08 NOTE — Hospital Course (Signed)
51 year old with history of hypertension presented to the emergency room with left-sided weakness and slurred speech.  She was found to have dense left hemiplegia and intracranial hemorrhage. ?2/19, admitted to intensive care unit with left-sided dense hemiplegia and slurred speech and found to have right intracranial hemorrhage with brain compression present on admission.  Treated with 3% saline, Cleviprex and intubated.  She failed intubation x2 and underwent tracheostomy on 2/28 by PCCM. ?2/28, tracheostomy. ?3/1 trach collar, off ventilator on 3/4. ?Transferred to medical floor on 3/6.  Cuffless trach on 3/16.  PEG tube placed on 3/20. ?Hospital course complicated by MSSA pneumonia, completed antibiotic therapy.   ?  ?

## 2022-03-08 NOTE — Assessment & Plan Note (Addendum)
Detected on CT angiogram 3/7.  ?Cleared for anticoagulation by neurology/Dr.Xu on 3/8  ?Patient was initially on heparin, then transitioned to Eliquis.   ?Currently has issues with abdominal distention of unclear etiology therefore all medications and tube feeding have been stopped per tube.  Discussed with pharmacy who recommended IV heparin as opposed to Lovenox. ? ?

## 2022-03-08 NOTE — Assessment & Plan Note (Addendum)
PEG tube placed by IR on 3/20 ?Continue tube feeding ?Change IV Heparin back to Eliquis ?Change Senokot to scheduled BID ?Continue D2 diet with thin liquids noting patient much more alert and more apt to tolerate this diet.  If intake improves may need to consider transitioning to nocturnal tube feedings ? ? ?

## 2022-03-08 NOTE — Progress Notes (Signed)
ANTICOAGULATION CONSULT NOTE ? ?Pharmacy Consult for Heparin  ?Indication: pulmonary embolus and CVA ? ?Allergies  ?Allergen Reactions  ? Lisinopril Cough  ? ? ?Patient Measurements: ?Height: 5\' 4"  (162.6 cm) ?Weight: 75.9 kg (167 lb 5.3 oz) ?IBW/kg (Calculated) : 54.7 ? ?Heparin Dosing Weight: 70 kg ? ?Vital Signs: ?Temp: 98.4 ?F (36.9 ?C) (03/22 1119) ?Temp Source: Axillary (03/22 1119) ?BP: 102/64 (03/22 1119) ?Pulse Rate: 66 (03/22 1119) ? ?Labs: ?Recent Labs  ?  03/05/22 ?1315 03/06/22 ?0409 03/06/22 ?0409 03/07/22 ?0040 03/08/22 ?0240  ?HGB  --  11.5*   < > 11.4* 11.5*  ?HCT  --  35.6*  --  35.7* 35.4*  ?PLT  --  244  --  279 236  ?APTT 67* 34  --  57*  --   ?HEPARINUNFRC  --  0.37  --  0.61  --   ?CREATININE  --   --   --  1.20*  --   ? < > = values in this interval not displayed.  ? ? ? ?Estimated Creatinine Clearance: 56.6 mL/min (A) (by C-G formula based on SCr of 1.2 mg/dL (H)). ? ? ?Assessment: ?51 yo female presents with right ICH with brain compression and found to have bilateral pulmonary emboli without RHS. PTA the patient was not on anticoagulation. The patient was started on apixaban on 3/11 but transitioned to heparin on 3/15 while awaiting PEG tube placement. Transitioned back to apixaban on 3/21 but now having abdominal distention. Last apixaban dose on 3/22 @ 0830. Pharmacy is consulted to manage heparin.  ?  ?Heparin infusion previously therapeutic at 1150 units/hour. CBC stable, no reported s/sx bleeding. Will restart heparin gtt without bolus. ?  ?Goal of Therapy:  ?Heparin level 0.3-0.5 units/ml ?aPTT 66-85 seconds ?Monitor platelets by anticoagulation protocol: Yes ? ?Plan:  ?Restart heparin infusion at 20:00 @ 1150 units/hr ?Check aPTT/heparin level in 6 hours and daily while on heparin ?Continue to monitor H&H and platelets ?Follow-up transitioning to apixaban ? ? ? ?Thank you for allowing pharmacy to be a part of this patient?s care. ? ?4/22, PharmD ?Clinical  Pharmacist ? ? ?

## 2022-03-08 NOTE — Progress Notes (Signed)
MD notified that patients abdomen appears to be distended and taut.  Abdominal xray ordered and tube feeds have been held. ?

## 2022-03-08 NOTE — Assessment & Plan Note (Addendum)
Presented with hypertensive emergency ?BP well controlled on Norvasc, carvedilol, Cozaar and Catapres ?Has as needed IV hydralazine as well as labetalol available ?

## 2022-03-08 NOTE — Assessment & Plan Note (Signed)
Current hemoglobin stable at 11.5 ?Consider regular per tube iron replacement ?

## 2022-03-09 ENCOUNTER — Inpatient Hospital Stay (HOSPITAL_COMMUNITY): Payer: 59

## 2022-03-09 LAB — GLUCOSE, CAPILLARY
Glucose-Capillary: 108 mg/dL — ABNORMAL HIGH (ref 70–99)
Glucose-Capillary: 121 mg/dL — ABNORMAL HIGH (ref 70–99)
Glucose-Capillary: 125 mg/dL — ABNORMAL HIGH (ref 70–99)
Glucose-Capillary: 129 mg/dL — ABNORMAL HIGH (ref 70–99)
Glucose-Capillary: 90 mg/dL (ref 70–99)
Glucose-Capillary: 98 mg/dL (ref 70–99)
Glucose-Capillary: 99 mg/dL (ref 70–99)

## 2022-03-09 LAB — APTT: aPTT: 65 seconds — ABNORMAL HIGH (ref 24–36)

## 2022-03-09 LAB — POTASSIUM: Potassium: 5.1 mmol/L (ref 3.5–5.1)

## 2022-03-09 LAB — CBC
HCT: 34.4 % — ABNORMAL LOW (ref 36.0–46.0)
Hemoglobin: 11.6 g/dL — ABNORMAL LOW (ref 12.0–15.0)
MCH: 30.2 pg (ref 26.0–34.0)
MCHC: 33.7 g/dL (ref 30.0–36.0)
MCV: 89.6 fL (ref 80.0–100.0)
Platelets: 255 10*3/uL (ref 150–400)
RBC: 3.84 MIL/uL — ABNORMAL LOW (ref 3.87–5.11)
RDW: 14.3 % (ref 11.5–15.5)
WBC: 6.1 10*3/uL (ref 4.0–10.5)
nRBC: 0 % (ref 0.0–0.2)

## 2022-03-09 LAB — HEPARIN LEVEL (UNFRACTIONATED): Heparin Unfractionated: >1.1 IU/mL — ABNORMAL HIGH (ref 0.30–0.70)

## 2022-03-09 MED ORDER — SENNOSIDES-DOCUSATE SODIUM 8.6-50 MG PO TABS
1.0000 | ORAL_TABLET | Freq: Two times a day (BID) | ORAL | Status: DC
  Administered 2022-03-09 – 2022-03-21 (×22): 1
  Filled 2022-03-09 (×24): qty 1

## 2022-03-09 MED ORDER — QUETIAPINE FUMARATE 50 MG PO TABS
50.0000 mg | ORAL_TABLET | Freq: Every day | ORAL | Status: DC
  Administered 2022-03-09: 50 mg
  Filled 2022-03-09: qty 1

## 2022-03-09 MED ORDER — APIXABAN 5 MG PO TABS
5.0000 mg | ORAL_TABLET | Freq: Two times a day (BID) | ORAL | Status: DC
  Administered 2022-03-09 – 2022-03-21 (×26): 5 mg
  Filled 2022-03-09 (×27): qty 1

## 2022-03-09 MED ORDER — CLONAZEPAM 0.5 MG PO TABS
0.5000 mg | ORAL_TABLET | Freq: Two times a day (BID) | ORAL | Status: DC
  Administered 2022-03-09 – 2022-03-12 (×7): 0.5 mg
  Filled 2022-03-09 (×7): qty 1

## 2022-03-09 MED ORDER — HYDROMORPHONE HCL 1 MG/ML IJ SOLN
0.5000 mg | Freq: Once | INTRAMUSCULAR | Status: AC
  Administered 2022-03-09: 0.5 mg via INTRAVENOUS
  Filled 2022-03-09: qty 0.5

## 2022-03-09 MED ORDER — CLONAZEPAM 0.5 MG PO TABS
1.0000 mg | ORAL_TABLET | Freq: Once | ORAL | Status: DC
  Filled 2022-03-09: qty 2

## 2022-03-09 MED ORDER — IOHEXOL 300 MG/ML  SOLN
100.0000 mL | Freq: Once | INTRAMUSCULAR | Status: AC | PRN
  Administered 2022-03-09: 100 mL via INTRAVENOUS

## 2022-03-09 NOTE — Progress Notes (Signed)
Occupational Therapy Treatment ?Patient Details ?Name: Sarah Myers ?MRN: [id number] ?DOB: 1971/04/06 ?Today's Date: 03/09/2022 ? ? ?History of present illness 51 y.o. female presents to [facility] hospital on 02/05/2022 with L hemiplegia and dysarthria. 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 Pt intubated 2/19. Attempted extubation 2/23 and 2/26, requiring reintubation same date. Tracheostomy 02/14/2022. PMH includes uncontrolled HTN ?  ?OT comments ? Patient was co-treat with ST to maximize patient participation.  Patient was max assist +2 to get to EOB and mod to max assist for sitting balance.  Patient was able to follow commands 50% of time with limited use of RUE for washing face due to use with balance. Patient was more alert while sitting EOB and responded with head nods for yes or no responses.  Acute OT to continue to follow.   ? ?Recommendations for follow up therapy are one component of a multi-disciplinary discharge planning process, led by the attending physician.  Recommendations may be updated based on patient status, additional functional criteria and insurance authorization. ?   ?Follow Up Recommendations ? Skilled nursing-short term rehab (<3 hours/day)  ?  ?Assistance Recommended at Discharge Frequent or constant Supervision/Assistance  ?Patient can return home with the following ? Two people to help with walking and/or transfers;Two people to help with bathing/dressing/bathroom;Assistance with feeding;Help with stairs or ramp for entrance;Assist for transportation;Assistance with cooking/housework;Direct supervision/assist for medications management ?  ?Equipment Recommendations ? Hospital bed;Wheelchair cushion (measurements OT);Wheelchair (measurements OT)  ?  ?Recommendations for Other Services   ? ?  ?Precautions / Restrictions Precautions ?Precautions: Fall ?Precaution Comments: L neglect, PEG ?Restrictions ?Weight Bearing Restrictions: No  ? ? ?  ? ?Mobility  Bed Mobility ?Overal bed mobility: Needs Assistance ?Bed Mobility: Supine to Sit, Sit to Supine ?  ?  ?Supine to sit: Max assist, +2 for physical assistance ?Sit to supine: Max assist, +2 for physical assistance ?  ?General bed mobility comments: use of pad to assist with getting to EOB and back to supine ?  ? ?Transfers ?  ?  ?  ?  ?  ?  ?  ?  ?  ?  ?  ?  ?Balance Overall balance assessment: Needs assistance ?Sitting-balance support: Feet supported, Single extremity supported ?Sitting balance-Leahy Scale: Zero ?Sitting balance - Comments: max assist initally and progressed to mod assist for sitting on EOB ?Postural control: Left lateral lean, Posterior lean ?  ?  ?  ?  ?  ?  ?  ?  ?  ?  ?  ?  ?  ?  ?   ? ?ADL either performed or assessed with clinical judgement  ? ?ADL Overall ADL's : Needs assistance/impaired ?  ?  ?Grooming: Wash/dry face;Maximal assistance;Sitting ?Grooming Details (indicate cue type and reason): hand over hand to total assist to wash face while seated on EOB ?  ?  ?  ?  ?  ?  ?  ?  ?  ?  ?  ?  ?  ?  ?  ?General ADL Comments: patient removed mouth swab when prompted while seated on EOB ?  ? ?Extremity/Trunk Assessment Upper Extremity Assessment ?RUE Deficits / Details: over and undershooting with R UE ?RUE Coordination: decreased fine motor;decreased gross motor ?LUE Deficits / Details: flaccid throughout LUE, with incresed swelling. ?LUE Sensation: decreased light touch ?LUE Coordination: decreased fine motor;decreased gross motor ?  ?  ?  ?  ?  ? ?Vision   ?  ?  ?  Perception   ?  ?Praxis   ?  ? ?Cognition Arousal/Alertness: Awake/alert, Lethargic ?Behavior During Therapy: Flat affect ?Overall Cognitive Status: Difficult to assess ?Area of Impairment: Attention, Following commands, Safety/judgement ?  ?  ?  ?  ?  ?  ?  ?  ?  ?Current Attention Level: Focused ?  ?Following Commands: Follows one step commands inconsistently, Follows one step commands with increased time ?Safety/Judgement:  Decreased awareness of safety ?  ?Problem Solving: Slow processing ?General Comments: fllowed more commands and indicated yes/no more often while seated on EOB ?  ?  ?   ?Exercises   ? ?  ?Shoulder Instructions   ? ? ?  ?General Comments    ? ? ?Pertinent Vitals/ Pain       Pain Assessment ?Pain Assessment: Faces ?Faces Pain Scale: Hurts a little bit ?Pain Location: back while sitting on EOB ?Pain Descriptors / Indicators: Grimacing, Other (Comment) (Patient reaching back with RUE to try to rub back) ?Pain Intervention(s): Limited activity within patient's tolerance, Monitored during session, Repositioned ? ?Home Living   ?  ?  ?  ?  ?  ?  ?  ?  ?  ?  ?  ?  ?  ?  ?  ?  ?  ?  ? ?  ?Prior Functioning/Environment    ?  ?  ?  ?   ? ?Frequency ? Min 2X/week  ? ? ? ? ?  ?Progress Toward Goals ? ?OT Goals(current goals can now be found in the care plan section) ? Progress towards OT goals: Progressing toward goals ? ?Acute Rehab OT Goals ?OT Goal Formulation: Patient unable to participate in goal setting ?Time For Goal Achievement: 03/15/22 ?Potential to Achieve Goals: Fair ?ADL Goals ?Pt Will Perform Grooming: with min assist;sitting ?Additional ADL Goal #1: Patient will perfrom supine to sit with Mod A to increase independence with toileting. ?Additional ADL Goal #2: Patient will follow two step commands 50% of the time with Min VC's  ?Plan Discharge plan remains appropriate   ? ?Co-evaluation ? ? ? PT/OT/SLP Co-Evaluation/Treatment: Yes ?Reason for Co-Treatment: For patient/therapist safety ?  ?OT goals addressed during session: ADL's and self-care ?  ? ?  ?AM-PAC OT "6 Clicks" Daily Activity     ?Outcome Measure ? ? Help from another person eating meals?: Total ?Help from another person taking care of personal grooming?: A Lot ?Help from another person toileting, which includes using toliet, bedpan, or urinal?: Total ?Help from another person bathing (including washing, rinsing, drying)?: A Lot ?Help from another  person to put on and taking off regular upper body clothing?: A Lot ?Help from another person to put on and taking off regular lower body clothing?: Total ?6 Click Score: 9 ? ?  ?End of Session Equipment Utilized During Treatment: Oxygen ? ?OT Visit Diagnosis: Muscle weakness (generalized) (M62.81);Other symptoms and signs involving cognitive function;Hemiplegia and hemiparesis ?Hemiplegia - Right/Left: Left ?Hemiplegia - dominant/non-dominant: Dominant ?Hemiplegia - caused by: Nontraumatic intracerebral hemorrhage ?  ?Activity Tolerance Patient limited by lethargy ?  ?Patient Left in bed;with call bell/phone within reach;with bed alarm set;with restraints reapplied ?  ?Nurse Communication Mobility status ?  ? ?   ? ?Time: 5374-8270 ?OT Time Calculation (min): 23 min ? ?Charges: OT General Charges ?$OT Visit: 1 Visit ?OT Treatments ?$Therapeutic Activity: 8-22 mins ? ?Alfonse Flavors, OTA ?Acute Rehabilitation Services  ?Pager [phone] ?Office [phone] ? ? ?Rainie Crenshaw Jeannett Senior ?03/09/2022, 2:29 PM ?

## 2022-03-09 NOTE — TOC Progression Note (Addendum)
Transition of Care (TOC) - Progression Note  ? ? ?Patient Details  ?Name: Sarah Myers ?MRN: [id number] ?Date of Birth: 1971-02-15 ? ?Transition of Care (TOC) CM/SW Contact  ?Janae Bridgeman, RN ?Phone Number: ?03/09/2022, 9:55 AM ? ?Clinical Narrative:    ?CM called and spoke with the patient's daughter, [name] on the phone to discuss transitions of care to skilled nursing facility.  The patient's daughter was updated that DTP Team will be assuming care of the patient for transitions of care to a skilled nursing facility.  I asked for a copy of the insurance card so I could update admitting and fax the patient out in the hub to insurance providers that are in network with [employer] plan - # [id number]. ? ?The patient's trach was placed on 02/14/2022 and will need to be 30 days before SNF facility is able to offer admission bed - family is aware. ? ?03/09/2022 1404 - CM called and spoke with Selena Batten, [facility] and asked that she review the patient's clinicals for possible bed offer.  The facility will have bed availability in the next week per the admission director. ? ?CM and MSW with DTP Team will continue to follow the patient for discharge planning for SNF facility. ? ? ?Expected Discharge Plan: Skilled Nursing Facility ?Barriers to Discharge: Continued Medical Work up ? ?Expected Discharge Plan and Services ?Expected Discharge Plan: Skilled Nursing Facility ?In-house Referral: Clinical Social Work, Artist ?Discharge Planning Services: CM Consult ?Post Acute Care Choice: Skilled Nursing Facility ?Living arrangements for the past 2 months: Apartment ?                ?  ?  ?  ?  ?  ?  ?  ?  ?  ?  ? ? ?Social Determinants of Health (SDOH) Interventions ?  ? ?Readmission Risk Interventions ? ?  03/09/2022  ?  9:54 AM  ?Readmission Risk Prevention Plan  ?Transportation Screening Complete  ?PCP or Specialist Appt within 3-5 Days Complete  ?HRI or Home Care Consult Not  Complete  ?Social Work Consult for Recovery Care Planning/Counseling Complete  ?Medication Review Oceanographer) Complete  ? ? ?

## 2022-03-09 NOTE — Progress Notes (Signed)
Abdominal CT scan done. MD notified and said can use the PEG tube now and would resume the feeding in AM. ?

## 2022-03-09 NOTE — Progress Notes (Signed)
Patient seen today by trach team for consult.  No education is needed at this time.  All necessary equipment is at beside.   Will continue to follow for progression.  

## 2022-03-09 NOTE — Progress Notes (Signed)
Speech Language Pathology Treatment: Dysphagia;Cognitive-Linquistic  ?Patient Details ?Name: Sarah Myers ?MRN: [id number] ?DOB: 23-Dec-1970 ?Today's Date: 03/09/2022 ?Time: 9518-8416 ?SLP Time Calculation (min) (ACUTE ONLY): 18 min ? ?Assessment / Plan / Recommendation ?Clinical Impression ? Treatment session consisted of dysphagia and cognitive intervention during co-tx with OT. Pt sitting on edge of bed and eyes open approximately 60% of session and would open on command most of the time. Use of stimuli to increase alertness with cold cloth. PMV donned without s/s back pressure until 15 minute mark when she began to wheeze slightly and removed with audible air present.  ?Hand over hand assist for face washing and teeth brushing with pt contributing maybe 5%. At end of session therapist asked pt to remove toothette and she retrieved it and handed it to therapist. Pt at times, appears able to do more than she puts effort into if she is awake. Encouragement throughout session to use voice versus responding with head nods/shakes and at end of session she imitated therapist and approximated "uh huh". Music played at initiation of session. Pt nodded she is left handed and may be right brain dominant for speech that may explain her lack of vocalizations.  ?Attempted mango icee and coke via spoon. She slightly accepted a minimal amount of Coke from the spoon and elicited one swallow.   ?HPI HPI: 51 y/o female came to the [facility] ER on 2/19 with left sided weakness and slurred speech.  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 Intubated 2/19. Trach changed from #6 cuffed to #4 cuffless on 3/16. Palliative NP met with patient's three daughters and decision was reached that they would like to see how PO trials go and if not tolerated they are open to PEG; SNF at discharge is anticipated by all parties. ?  ?   ?SLP Plan ? Continue with current plan of care ? ?  ?  ?Recommendations for follow  up therapy are one component of a multi-disciplinary discharge planning process, led by the attending physician.  Recommendations may be updated based on patient status, additional functional criteria and insurance authorization. ?  ? ?Recommendations  ?Diet recommendations: NPO ?Medication Administration: Via alternative means  ?   ? Patient may use Passy-Muir Speech Valve: During all therapies with supervision ?PMSV Supervision: Full  ?   ? ? ? ? Oral Care Recommendations: Oral care QID ?Follow Up Recommendations: Skilled nursing-short term rehab (<3 hours/day) ?Assistance recommended at discharge: Frequent or constant Supervision/Assistance ?SLP Visit Diagnosis: Aphonia (R49.1);Cognitive communication deficit (R41.841) ?Plan: Continue with current plan of care ? ? ? ? ?  ?  ? ? ?[name] ? ?03/09/2022, 2:52 PM ?

## 2022-03-09 NOTE — Progress Notes (Addendum)
?Progress Note ? ? ?Patient: Sarah Myers [id number] DOB: September 26, 1971 DOA: 02/05/2022     32 ?DOS: the patient was seen and examined on 03/09/2022 ?  ?Brief hospital course: ?51 year old with history of hypertension presented to the emergency room with left-sided weakness and slurred speech.  She was found to have dense left hemiplegia and intracranial hemorrhage. ?2/19, admitted to intensive care unit with left-sided dense hemiplegia and slurred speech and found to have right intracranial hemorrhage with brain compression present on admission.  Treated with 3% saline, Cleviprex and intubated.  She failed intubation x2 and underwent tracheostomy on 2/28 by PCCM. ?2/28, tracheostomy. ?3/1 trach collar, off ventilator on 3/4. ?Transferred to medical floor on 3/6.  Cuffless trach on 3/16.  PEG tube placed on 3/20. ?Hospital course complicated by MSSA pneumonia, completed antibiotic therapy.   ?  ? 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  Normal ejection fraction. ?CT head 3/8, expected regression of right ICH. ?CT head 3/12 continued expected interval evolution of intraparenchymal hematoma ?Remains unresponsive so we will decrease Klonopin further as well as Seroquel. ?Continue Robaxin for hypertonicity since is less sedating than baclofen or tizanidine ?Continue Symmetrel  ? 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 ?Tracheostomy dependent ([place]) ?2/28, tracheostomy ?3/1, trach collar. ?3/16, downsized to cuffless #4 trach.  Currently on trach collar with minimum oxygen. ? 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 ? ? ?Bilateral pulmonary embolism (HCC) ?Detected on CT angiogram 3/7.  ?Cleared for anticoagulation by neurology/Dr.Xu on 3/8  ?Patient was initially on heparin, then transitioned to Eliquis.   ?Currently has issues with abdominal distention of unclear etiology therefore all medications and tube feeding have been stopped per tube.  Discussed with pharmacy who recommended IV heparin as opposed to Lovenox. ? ? ?Malignant hypertension ?Presented with hypertensive emergency ?Continue Norvasc, carvedilol, Cozaar and Catapres ?Has as needed IV hydralazine as well as labetalol available ? ?Counseling regarding goals of care ?High risk of rehemorrhage on anticoagulation.   ?Palliative care, family meeting 3/16.  She is very high risk of decompensation, she is very high risk of getting another bleeding.  Family requested full scope of treatment ?3/21 patient's daughter [name] at the bedside.  Given updates about brain hemorrhage, massive pulmonary embolism on blood thinners and challenging treatment options previous attending physician.  All questions were answered.  Since patient becomes very uncomfortable at night, we decided to keep on current doses of medications including Seroquel and pain medications. ? ?Iron deficiency anemia ?Current hemoglobin stable at 11.5 ?Consider regular per tube iron replacement ? 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 Subcutaneous fat may be visible but bone, tendon or muscle are NOT exposed. (Active)  ?Date First Assessed/Time First Assessed: 02/26/22 0443   Location: Cervical  Location Orientation: Left  Staging: Stage 3 -  Full thickness tissue loss. Subcutaneous fat may be visible but bone, tendon or muscle are NOT exposed.  Present on Admission: No   ?  ?Assessments 02/26/2022  4:44 AM 03/08/2022  8:41 AM  ?Dressing Type -- Foam - Lift dressing to assess site every shift  ?Dressing Clean, Dry, Intact Changed;Clean, Dry, Intact  ?Dressing Change Frequency -- Every 3 days  ?Peri-wound Assessment Erythema (blanchable) Erythema (blanchable)  ?Margins Unattached edges (unapproximated) Unattached edges (unapproximated)  ?Drainage Amount None --  ?   ?No Linked orders to display  ? ? ? ? ? ?  ? ?Subjective:  ?With extensive stimulation patient was able to have a response related to pain noting she was localizing pain at the PEG insertion site but never opened her eyes.  During stimulation she tried to move her extremities ? ?Physical Exam: ?Vitals:  ? 03/09/22 0417 03/09/22 0733 03/09/22 0748 03/09/22 1138  ?BP: 105/66 107/89  99/76  ?Pulse: (!) 58 65 (!) 56 60  ?Resp: 14 20 18 18   ?Temp: 97.7 ?F (36.5 ?C) 98.1 ?F (36.7 ?C)  98.1 ?F (36.7 ?C)  ?TempSrc: Oral Oral  Axillary  ?SpO2: 100%  100% (!) 1%  ?Weight:      ?Height:      ? ?Constitutional: Essentially unresponsive today.  She did respond to palpation over PEG tube site with grimacing ?Respiratory: Anterior lung sounds clear to auscultation bilaterally, Normal respiratory effort. No accessory muscle use.  Room air ?Cardiovascular: Regular rate and rhythm, No extremity edema.  Normotensive ?Abdomen: no tenderness when palpating directly over PEG site, Bowel sounds positive.  LBM 3/22 ?Musculoskeletal: no clubbing / cyanosis. No joint deformity upper and lower extremities. No contractures.  Generalized hypertonic muscle tone.  ?Neurologic: CN 2-12 appears to be grossly intact. Sensation appears to be grossly intact, unable to accurately test strength since patient unable to follow simple commands.  As noted patient has significant extremity hypertonicity ?Psychiatric: Unresponsive and unable to effectively evaluate ? ?Data Reviewed: ?CBC reviewed for today ? ?Family Communication:  ?No family at  bedside ? ?Disposition: ?Remains inpatient appropriate because:  ?Requires skilled nursing level of care due to tracheostomy tube, unresponsive state and ongoing need for tube feedings.  She is custodial care. ? ?Planned Discharge Destination:  ?Skilled nursing facility ? ?Medically stable ?Yes ? ?COVID vaccination status:  ?unknown ? ?Consultants: ?Neurosurgery ?PCCM ?Palliative medicine ?Internal radiology ?Procedures: ?Intubation ?Echocardiogram ?Follow-up echocardiogram with bubble study ?Cortrack tube ?Bedside tracheostomy tube placement ?PEG tube placement ?Antibiotics: ?IV vancomycin x1 dose on 2/23 ?Cefepime 2/23 through 3/1 ?Azithromycin 3/7 ?Cefepime 3/7, 3/8 ?Vancomycin 3/7 ?Cefazolin 3/8 through 3/16 ?Fluconazole 3/19 ?Cefazolin 3/20 ? ? ? ? ?Time spent: 45 minutes ? ?Author: ?Erin Hearing, NP ?03/09/2022 12:00 PM ? ?For on call review [email or web address].  ?

## 2022-03-09 NOTE — Plan of Care (Signed)
?  Problem: Elimination: ?Goal: Will not experience complications related to bowel motility ?Outcome: Progressing ?  ?Problem: Pain Managment: ?Goal: General experience of comfort will improve ?Outcome: Progressing ?  ?Problem: Education: ?Goal: Knowledge of disease or condition will improve ?Outcome: Progressing ?  ?

## 2022-03-10 LAB — GLUCOSE, CAPILLARY
Glucose-Capillary: 129 mg/dL — ABNORMAL HIGH (ref 70–99)
Glucose-Capillary: 146 mg/dL — ABNORMAL HIGH (ref 70–99)
Glucose-Capillary: 151 mg/dL — ABNORMAL HIGH (ref 70–99)
Glucose-Capillary: 157 mg/dL — ABNORMAL HIGH (ref 70–99)
Glucose-Capillary: 168 mg/dL — ABNORMAL HIGH (ref 70–99)
Glucose-Capillary: 169 mg/dL — ABNORMAL HIGH (ref 70–99)
Glucose-Capillary: 181 mg/dL — ABNORMAL HIGH (ref 70–99)

## 2022-03-10 MED ORDER — POLYETHYLENE GLYCOL 3350 17 G PO PACK
17.0000 g | PACK | Freq: Every day | ORAL | Status: DC | PRN
  Administered 2022-03-10: 17 g
  Filled 2022-03-10 (×2): qty 1

## 2022-03-10 MED ORDER — OLANZAPINE 2.5 MG PO TABS
2.5000 mg | ORAL_TABLET | Freq: Every day | ORAL | Status: DC
  Administered 2022-03-10 – 2022-03-21 (×12): 2.5 mg
  Filled 2022-03-10 (×12): qty 1

## 2022-03-10 NOTE — Progress Notes (Addendum)
?Progress Note ? ? ?Patient: Sarah Myers [id number] DOB: 10-14-1971 DOA: 02/05/2022     33 ?DOS: the patient was seen and examined on 03/10/2022 ?  ?Brief hospital course: ?51 year old with history of hypertension presented to the emergency room with left-sided weakness and slurred speech.  She was found to have dense left hemiplegia and intracranial hemorrhage. ?2/19, admitted to intensive care unit with left-sided dense hemiplegia and slurred speech and found to have right intracranial hemorrhage with brain compression present on admission.  Treated with 3% saline, Cleviprex and intubated.  She failed intubation x2 and underwent tracheostomy on 2/28 by PCCM. ?2/28, tracheostomy. ?3/1 trach collar, off ventilator on 3/4. ?Transferred to medical floor on 3/6.  Cuffless trach on 3/16.  PEG tube placed on 3/20. ?Hospital course complicated by MSSA pneumonia, completed antibiotic therapy.   ?  ? 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  Normal ejection fraction. ?CT head 3/8, expected regression of right ICH. ?CT head 3/12 continued expected interval evolution of intraparenchymal hematoma ?3/24 continue Klonopin 0.5 mg BID for 72 hours then decrease to daily at bedtime.  DC Symmetrel in favor of Zyprexa 2.5 mg at HS. Patient able to be awakened today with verbal and tactile stimulation she opens her eyes and when asked to move her right arm she did so weakly. ?Continue Robaxin for hypertonicity since is less sedating than baclofen or tizanidine ?Continue Symmetrel  ? 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 ?Tracheostomy dependent ([place]) ?2/28, tracheostomy ?3/1, trach collar. ?3/16, downsized to cuffless #4 trach.  Currently on  trach collar with minimum oxygen. ? 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 ? ? ?Bilateral pulmonary embolism (HCC) ?Detected on CT angiogram 3/7.  ?Cleared for anticoagulation by neurology/Dr.Xu on 3/8  ?Patient was initially on heparin, then transitioned to Eliquis.   ?Currently has issues with abdominal distention of unclear etiology therefore all medications and tube feeding have been stopped per tube.  Discussed with pharmacy who recommended IV heparin as opposed to Lovenox. ? ? ?Malignant hypertension ?Presented with hypertensive emergency ?BP well controlled on Norvasc, carvedilol, Cozaar and Catapres ?Has as needed IV hydralazine as well as labetalol available ? ?Counseling regarding goals of care ?High risk of rehemorrhage on anticoagulation.   ?Palliative care, family meeting 3/16.  She is very high risk of decompensation, she is very high risk of getting another bleeding.  Family requested full scope of treatment ?3/21 patient's daughter [name] at the bedside.  Given updates about brain hemorrhage, massive pulmonary embolism on blood thinners and challenging treatment options previous attending physician.  All questions were answered.  Since patient becomes very uncomfortable at night, we decided to keep on current doses of medications including Seroquel and pain medications. ? ?Iron deficiency anemia ?Current hemoglobin stable at 11.5 ?Consider regular per tube iron replacement ? 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 Subcutaneous fat may  be visible but bone, tendon or muscle are NOT exposed. (Active)   ?Date First Assessed/Time First Assessed: 02/26/22 0443   Location: Cervical  Location Orientation: Left  Staging: Stage 3 -  Full thickness tissue loss. Subcutaneous fat may be visible but bone, tendon or muscle are NOT exposed.  Present on Admission: No  ?  ?Assessments 02/26/2022  4:44 AM 03/09/2022  8:00 AM  ?Dressing Type -- Foam - Lift dressing to assess site every shift  ?Dressing Clean, Dry, Intact Reinforced  ?Dressing Change Frequency -- Every 3 days  ?Peri-wound Assessment Erythema (blanchable) --  ?Margins Unattached edges (unapproximated) Unattached edges (unapproximated)  ?Drainage Amount None --  ?   ?No Linked orders to display  ? ? ? ? ? ?  ? ?Subjective:  ?Combination of tactile and verbal stimulation patient opened her eyes and she did look at me.  When I asked her to move her right arm she did so weakly.  Note she apparently has been having purposeful movement with R UE since she has a mitten in place. ? ?Physical Exam: ?Vitals:  ? 03/10/22 0042 03/10/22 0300 03/10/22 [id number] 03/10/22 0859  ?BP: 106/67 103/75 106/63   ?Pulse: 62 (!) 58 60 64  ?Resp: 16 18  18   ?Temp: 98.4 ?F (36.9 ?C) 97.8 ?F (36.6 ?C) 98 ?F (36.7 ?C)   ?TempSrc: Axillary Axillary Oral   ?SpO2: 100% 100% 100% 98%  ?Weight:      ?Height:      ? ?Constitutional: Lethargic but able to be awakened for short.  Utilization of verbal and tactile stimulation ?Respiratory: Lung sounds are clear to auscultation, has trach in place 4.0 cuffless to trach collar, stable on 5 L O2 ?Cardiovascular: Regular rate and rhythm, No extremity edema.  Normotensive ?Abdomen: no tenderness when palpating directly over PEG site, Bowel sounds positive.  LBM 3/22 ?Musculoskeletal: no clubbing / cyanosis.  Generalized hypertonic muscle tone has significantly improved ?Neurologic: CN 2-12 appears to be grossly intact. Sensation appears to be grossly intact especially on the right.  She was able to follow simple commands very weakly and briefly with the R  UE ?Psychiatric: Lethargic and able to be awakened for brief periods with significant verbal and tactile stimulation.  Has trach in place that is not So unable to verbally respond ? ?Data Reviewed: ?CBC reviewed for today ? ?Family Communication:  ?Updated dtr Berdine Dance ? ?Disposition: ?Remains inpatient appropriate because:  ?Requires skilled nursing level of care due to tracheostomy tube, unresponsive state and ongoing need for tube feedings.  She is custodial care. ? ?Planned Discharge Destination:  ?Skilled nursing facility ? ?Medically stable ?Yes ? ?COVID vaccination status:  ?unknown ? ?Consultants: ?Neurosurgery ?PCCM ?Palliative medicine ?Internal radiology ?Procedures: ?Intubation ?Echocardiogram ?Follow-up echocardiogram with bubble study ?Cortrack tube ?Bedside tracheostomy tube placement ?PEG tube placement ?Antibiotics: ?IV vancomycin x1 dose on 2/23 ?Cefepime 2/23 through 3/1 ?Azithromycin 3/7 ?Cefepime 3/7, 3/8 ?Vancomycin 3/7 ?Cefazolin 3/8 through 3/16 ?Fluconazole 3/19 ?Cefazolin 3/20 ? ? ? ? ?Time spent: 45 minutes ? ?Author: ?Erin Hearing, NP ?03/10/2022 9:55 AM ? ?For on call review [email or web address]  ?

## 2022-03-10 NOTE — TOC Progression Note (Signed)
Transition of Care (TOC) - Progression Note  ? ? ?Patient Details  ?Name: Sarah Myers ?MRN: [id number] ?Date of Birth: [birth year] ? ?Transition of Care (TOC) CM/SW Contact  ?Curlene Labrum, RN ?Phone Number: ?03/10/2022, 9:01 AM ? ?Clinical Narrative:    ?CM met with Madilyn Fireman, MSW and PASSR initiated and FL2 completed and patient's clinicals faxed out in the hub for review.  I spoke with the patient's daughter, Jelicia Nantz earlier in the week and she is agreeable to SNF disposition and is aware that placement pending acceptable bed offer in the hub and the patient's trach will need to be 74 days old prior to transfer to a SNF.  The patient's trach is 4 mm uncuffed [place] on Room air O2 and will be 52 days old on 03/14/2022. ? ?I spoke with Maudie Mercury, Admission director with [facility] and she will review the patient's clinicals for possible assistance with placement in the next week depending on acceptance and availability. ? ? ?Expected Discharge Plan: [place] ?Barriers to Discharge: Continued Medical Work up ? ?Expected Discharge Plan and Services ?Expected Discharge Plan: [place] ?In-house Referral: Clinical Social Work, Development worker, community ?Discharge Planning Services: CM Consult ?Post Acute Care Choice: [place] ?Living arrangements for the past 2 months: Apartment ?                ?  ?  ?  ?  ?  ?  ?  ?  ?  ?  ? ? ?Social Determinants of Health (SDOH) Interventions ?  ? ?Readmission Risk Interventions ? ?  03/09/2022  ?  9:54 AM  ?Readmission Risk Prevention Plan  ?Transportation Screening Complete  ?PCP or Specialist Appt within 3-5 Days Complete  ?[place] or Home Care Consult Not Complete  ?Social Work Consult for [place] Planning/Counseling Complete  ?Medication Review Press photographer) Complete  ? ? ?

## 2022-03-10 NOTE — Progress Notes (Signed)
Patient DX:AJOINO RANAE CASEBIER      DOB: [birth year]      MVE:[id number] ? ? ? ?  ?Palliative Medicine Team ? ? ? ?Subjective: Bedside symptom check. No family or visitors present at time of visit. ? ? ?Physical exam: Upon entering room, this RN found patient to be alert and waving around her right arm and kicking right leg. Patient made and maintained eye contact with this RN throughout visit. This RN asked patient if she was hurting and she nodded yes. This RN asked her to point to where it hurt and she pointed to her trach. This RN asked if she could help reposition the patient and she shook her head no and again began to wave her arm around.  ? ? ?Assessment and plan: The patient displayed signs of physical discomfort and/or pain. Patient was tachypneac, agitated, displaying hypermobility in right upper and lower extremities. Patient grimacing and trying to communicate with this RN. This RN told the patient she would leave room and touch base with bedside staff for more guidance and collaborate on how to best help the patient. Bedside RN Seward Grater reports that the patient has been acting agitated for most of her shift today. She reports having just medicated the patient with both anxiety and pain medications, waiting to see if they assist in comfort for the patient. This RN communicated that she will try to find a wishbone pillow to allow for better support of the patient's neck, as a standard pillow sometimes places too much stress on the new tracheostomy site. Bedside RN agrees and is thankful for additional support. Will continue to follow for changes and advances.  ? ? ?Thank you for allowing the Palliative Medicine Team to assist in the care of this patient. ?  ?  ?Shelda Jakes, MSN, RN ?Palliative Medicine Team ?Team Phone: [phone]  ?This phone is monitored 7a-7p, please reach out to attending physician outside of these hours for urgent needs.   ?

## 2022-03-10 NOTE — Plan of Care (Signed)
  Problem: Pain Managment: Goal: General experience of comfort will improve Outcome: Progressing   

## 2022-03-10 NOTE — [place] FL2 (Signed)
?[place] MEDICAID FL2 LEVEL OF CARE SCREENING TOOL  ?  ? ?IDENTIFICATION  ?Patient Name: ?Sarah Myers Birthdate: [birth year] Sex: female Admission Date (Current Location): ?02/05/2022  ?[place] and [place] Number: ? [place] ?  Facility and Address:  ?[facility]. [place], [place] [address] [place], [place] [address] ?     Provider Number: ?[id number]  ?Attending Physician Name and Address:  ?Hosie Poisson, MD ? Relative Name and Phone Number:  ?Shaquana Buel - daughter- [phone] ?   ?Current Level of Care: ?Hospital Recommended Level of Care: ?[place] Prior Approval Number: ?  ? ?Date Approved/Denied: ?  PASRR Number: ?[id number] A ? ?Discharge Plan: ?SNF ?  ? ?Current Diagnoses: ?Patient Active Problem List  ? Diagnosis Date Noted  ? 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 Counseling regarding goals of care 03/08/2022  ? Tracheostomy dependent ([place])   ? Bilateral pulmonary embolism (HCC)   ? AKI (acute kidney injury) ([place])   ? Acute respiratory failure 2/2 massive hemorrhagic CVA   ? Pressure injury of skin 02/07/2022  ? 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 Iron deficiency anemia 06/30/2020  ? Vitamin D deficiency 06/30/2020  ? GAD (generalized anxiety disorder) 05/18/2020  ? Malignant hypertension 10/30/2019  ? ? ?Orientation RESPIRATION BLADDER Height & Weight   ?  ?Self ?   Incontinent Weight: 75.9 kg ?Height:  '5\' 4"'  (162.6 cm)  ?BEHAVIORAL SYMPTOMS/MOOD NEUROLOGICAL BOWEL NUTRITION STATUS  ?    Incontinent Feeding tube, Supplemental (PEG tube  - placed 03/06/2022)  ?AMBULATORY STATUS COMMUNICATION OF NEEDS Skin   ?Total Care Non-Verbally (Nods Yes/No to simple questions/commands) PU Stage and Appropriate Care (Cervical area - foam dressing changer Q 3 days) ?  ?  ?PU Stage 3 Dressing:  (Foam Dressing change every 3 days) ?    ?     ?     ? ? ?Personal Care Assistance Level of Assistance  ?Bathing, Feeding,  Dressing, Total care Bathing Assistance: Maximum assistance ?Feeding assistance: Maximum assistance (PEG tube - placed 03/06/2022) ?Dressing Assistance: Maximum assistance ?Total Care Assistance: Maximum assistance  ? ?Functional Limitations Info  ?Sight, Hearing, Speech Sight Info: Adequate ?Hearing Info: Adequate ?Speech Info: Impaired (Trach/ trach collar  - placed 02/14/2022 - 4 mm uncuffed - trach collar on Room Air)  ? ? ?SPECIAL CARE FACTORS FREQUENCY  ?PT (By licensed PT), OT (By licensed OT)   ?  ?PT Frequency: 3-4 x per week ?OT Frequency: 3-4 x per week ?  ?  ?  ?   ? ? ?Contractures Contractures Info: Not present  ? ? ?Additional Factors Info  ?Code Status, Allergies, Psychotropic, Insulin Sliding Scale, Suctioning Needs Code Status Info: Full code ?Allergies Info: Lisinopril ?Psychotropic Info: Symmetrel, Klonopin, Seroquel ?Insulin Sliding Scale Info: Moderate Sliding Scale Insulin (Novolog SQ Q 4 hours) ?  ?Suctioning Needs: See RT notes - 4 mm uncuffed trach  ? ?Current Medications (03/10/2022):  This is the current hospital active medication list ?Current Facility-Administered Medications  ?Medication Dose Route Frequency Provider Last Rate Last Admin  ? acetaminophen (TYLENOL) tablet 650 mg  650 mg Per Tube Q4H PRN Juanito Doom, MD   650 mg at 03/03/22 0435  ? Or  ? acetaminophen (TYLENOL) 160 MG/5ML solution 650 mg  650 mg Per Tube Q4H PRN Juanito Doom, MD   650 mg at 03/06/22 2049  ? Or  ? acetaminophen (TYLENOL) suppository 650 mg  650 mg Rectal  Q4H PRN Juanito Doom, MD      ? amantadine (SYMMETREL) 50 MG/5ML solution 50 mg  50 mg Per Tube BID Samella Parr, NP   50 mg at 03/09/22 2139  ? amLODipine (NORVASC) tablet 10 mg  10 mg Per Tube Daily Simonne Maffucci B, MD   10 mg at 03/08/22 8756  ? apixaban (ELIQUIS) tablet 5 mg  5 mg Per Tube BID Samella Parr, NP   5 mg at 03/09/22 2139  ? carvedilol (COREG) tablet 25 mg  25 mg Per Tube BID WC Simonne Maffucci B, MD   25 mg at  03/08/22 1714  ? chlorhexidine gluconate (MEDLINE KIT) (PERIDEX) 0.12 % solution 15 mL  15 mL Mouth Rinse BID Simonne Maffucci B, MD   15 mL at 03/09/22 2029  ? clonazePAM (KLONOPIN) tablet 0.5 mg  0.5 mg Per Tube BID Samella Parr, NP   0.5 mg at 03/09/22 2139  ? cloNIDine (CATAPRES) tablet 0.1 mg  0.1 mg Oral TID PRN Alma [place], MD      ? feeding supplement (JEVITY 1.5 CAL/FIBER) liquid 1,000 mL  1,000 mL Per Tube Continuous Alma [place], MD 55 mL/hr at 03/09/22 1645 Rate Change at 03/09/22 1645  ? feeding supplement (PROSource TF) liquid 45 mL  45 mL Per Tube BID Juanito Doom, MD   45 mL at 03/09/22 2138  ? free water 200 mL  200 mL Per Tube Q8H Hosie Poisson, MD   200 mL at 03/10/22 0528  ? guaiFENesin tablet 200 mg  200 mg Per Tube Q6H Simonne Maffucci B, MD   200 mg at 03/10/22 0531  ? hydrALAZINE (APRESOLINE) injection 20 mg  20 mg Intravenous Q6H PRN Simonne Maffucci B, MD   20 mg at 02/17/22 1426  ? insulin aspart (novoLOG) injection 0-15 Units  0-15 Units Subcutaneous Q4H Juanito Doom, MD   2 Units at 03/10/22 4332  ? labetalol (NORMODYNE) injection 10 mg  10 mg Intravenous Q2H PRN Juanito Doom, MD   10 mg at 02/20/22 1447  ? MEDLINE mouth rinse  15 mL Mouth Rinse 10 times per day Juanito Doom, MD   15 mL at 03/10/22 9518  ? methocarbamol (ROBAXIN) tablet 500 mg  500 mg Per Tube TID Samella Parr, NP   500 mg at 03/09/22 2139  ? multivitamin with minerals tablet 1 tablet  1 tablet Per Tube Daily Juanito Doom, MD   1 tablet at 03/09/22 8416  ? nutrition supplement (JUVEN) (JUVEN) powder packet 1 packet  1 packet Per Tube BID BM Hosie Poisson, MD   1 packet at 03/09/22 1253  ? oxyCODONE (Oxy IR/ROXICODONE) immediate release tablet 5 mg  5 mg Per Tube Q4H PRN Barb Merino, MD   5 mg at 03/09/22 1903  ? pantoprazole sodium (PROTONIX) 40 mg/20 mL oral suspension 40 mg  40 mg Per Tube Daily Juanito Doom, MD   40 mg at 03/09/22 6063  ? QUEtiapine  (SEROQUEL) tablet 50 mg  50 mg Per Tube QHS Samella Parr, NP   50 mg at 03/09/22 2139  ? senna-docusate (Senokot-S) tablet 1 tablet  1 tablet Per Tube BID Samella Parr, NP   1 tablet at 03/09/22 2139  ? ? ? ?Discharge Medications: ?Please see discharge summary for a list of discharge medications. ? ?Relevant Imaging Results: ? ?Relevant Lab Results: ? ? ?Additional Information ?SS# [id number] ? ?Curlene Labrum, RN ? ? ? ? ?

## 2022-03-10 NOTE — Progress Notes (Signed)
CSW obtained PASSR for patient - [id number] A. ? ?Madilyn Fireman, MSW, LCSW ?Transitions of Care  Clinical Social Worker II ?[phone] ? ?

## 2022-03-10 NOTE — Progress Notes (Signed)
Physical Therapy Treatment ?Patient Details ?Name: Sarah Myers ?MRN: [id number] ?DOB: 28-Oct-1971 ?Today's Date: 03/10/2022 ? ? ?History of Present Illness 51 y.o. female presents to [facility] hospital on 02/05/2022 with L hemiplegia and dysarthria. 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 Pt intubated 2/19. Attempted extubation 2/23 and 2/26, requiring reintubation same date. Tracheostomy 02/14/2022. PMH includes uncontrolled HTN ? ?  ?PT Comments  ? ? Pt more alert and restless than prior sessions. Following 1 step commands. Left neglect severe. Continue to feel pt needs SNF at DC.    ?Recommendations for follow up therapy are one component of a multi-disciplinary discharge planning process, led by the attending physician.  Recommendations may be updated based on patient status, additional functional criteria and insurance authorization. ? ?Follow Up Recommendations ? Skilled nursing-short term rehab (<3 hours/day) ?  ?  ?Assistance Recommended at Discharge Frequent or constant Supervision/Assistance  ?Patient can return home with the following Two people to help with walking and/or transfers;Two people to help with bathing/dressing/bathroom;Assistance with cooking/housework;Assistance with feeding;Direct supervision/assist for financial management;Direct supervision/assist for medications management;Help with stairs or ramp for entrance;Assist for transportation ?  ?Equipment Recommendations ? Wheelchair (measurements PT);Wheelchair cushion (measurements PT);Hospital bed;Other (comment) (hoyer lift)  ?  ?Recommendations for Other Services   ? ? ?  ?Precautions / Restrictions Precautions ?Precautions: Fall ?Precaution Comments: L neglect, PEG ?Restrictions ?Weight Bearing Restrictions: No  ?  ? ?Mobility ? Bed Mobility ?Overal bed mobility: Needs Assistance ?  ?  ?  ?  ?  ?  ?General bed mobility comments: Placed bed in chair position and worked to have pt assist in sitting forward and  bringing trunk off of bed. Wanted pt to assist using RUE and trunk. Pt total assist and pointed to abdomen indicating pain. Attempted multiple times with same result ?  ? ?Transfers ?  ?  ?  ?  ?  ?  ?  ?  ?  ?  ?  ? ?Ambulation/Gait ?  ?  ?  ?  ?  ?  ?  ?  ? ? ?Stairs ?  ?  ?  ?  ?  ? ? ?Wheelchair Mobility ?  ? ?Modified Rankin (Stroke Patients Only) ?Modified Rankin (Stroke Patients Only) ?Pre-Morbid Rankin Score: No symptoms ?Modified Rankin: Severe disability ? ? ?  ?Balance   ?  ?  ?  ?  ?  ?  ?  ?  ?  ?  ?  ?  ?  ?  ?  ?  ?  ?  ?  ? ?  ?Cognition Arousal/Alertness: Awake/alert ?Behavior During Therapy: Restless ?Overall Cognitive Status: Difficult to assess ?Area of Impairment: Attention, Following commands, Safety/judgement ?  ?  ?  ?  ?  ?  ?  ?  ?  ?Current Attention Level: Focused ?  ?Following Commands: Follows one step commands inconsistently, Follows one step commands with increased time ?Safety/Judgement: Decreased awareness of safety, Decreased awareness of deficits ?  ?Problem Solving: Slow processing ?General Comments: followed 1 step  commands with rt side ?  ?  ? ?  ?Exercises General Exercises - Lower Extremity ?Long Arc Quad: AAROM, Right, 10 reps, Seated ?Other Exercises ?Other Exercises: Attempted reaching exercises with RUE. Pt reaching but having difficulty locating my hand even when on right side ?Other Exercises: Worked on attending to lt side with manual facilitation to turn head. Unable to locate objects past midline to the left. ? ?  ?General Comments   ?  ?  ? ?  Pertinent Vitals/Pain Pain Assessment ?Pain Assessment: Faces ?Faces Pain Scale: Hurts little more ?Pain Location: abdomen ?Pain Descriptors / Indicators: Grimacing, Other (Comment), Guarding (Pt pointing to abdomen when asked where she was hurting) ?Pain Intervention(s): Limited activity within patient's tolerance, Repositioned  ? ? ?Home Living   ?  ?  ?  ?  ?  ?  ?  ?  ?  ?   ?  ?Prior Function    ?  ?  ?   ? ?PT Goals  (current goals can now be found in the care plan section) Progress towards PT goals: Progressing toward goals ? ?  ?Frequency ? ? ? Min 2X/week ? ? ? ?  ?PT Plan Current plan remains appropriate;Frequency needs to be updated  ? ? ?Co-evaluation   ?  ?  ?  ?  ? ?  ?AM-PAC PT "6 Clicks" Mobility   ?Outcome Measure ? Help needed turning from your back to your side while in a flat bed without using bedrails?: Total ?Help needed moving from lying on your back to sitting on the side of a flat bed without using bedrails?: Total ?Help needed moving to and from a bed to a chair (including a wheelchair)?: Total ?Help needed standing up from a chair using your arms (e.g., wheelchair or bedside chair)?: Total ?Help needed to walk in hospital room?: Total ?Help needed climbing 3-5 steps with a railing? : Total ?6 Click Score: 6 ? ?  ?End of Session Equipment Utilized During Treatment: Oxygen ?Activity Tolerance: Patient tolerated treatment well ?Patient left: in bed;with call bell/phone within reach;with bed alarm set ?  ?PT Visit Diagnosis: Other abnormalities of gait and mobility (R26.89);Hemiplegia and hemiparesis ?Hemiplegia - Right/Left: Left ?Hemiplegia - dominant/non-dominant: Dominant ?Hemiplegia - caused by: Nontraumatic intracerebral hemorrhage ?  ? ? ?Time: 7408-1448 ?PT Time Calculation (min) (ACUTE ONLY): 14 min ? ?Charges:  $Therapeutic Activity: 8-22 mins          ?          ? ?[facility] PT ?Acute Rehabilitation Services ?Pager [phone] ?Office [phone] ? ? ? ?[name] [facility] ?03/10/2022, 2:58 PM ? ?

## 2022-03-11 LAB — CBC WITH DIFFERENTIAL/PLATELET
Abs Immature Granulocytes: 0.01 10*3/uL (ref 0.00–0.07)
Basophils Absolute: 0 10*3/uL (ref 0.0–0.1)
Basophils Relative: 1 %
Eosinophils Absolute: 0.3 10*3/uL (ref 0.0–0.5)
Eosinophils Relative: 7 %
HCT: 37 % (ref 36.0–46.0)
Hemoglobin: 12.1 g/dL (ref 12.0–15.0)
Immature Granulocytes: 0 %
Lymphocytes Relative: 29 %
Lymphs Abs: 1.6 10*3/uL (ref 0.7–4.0)
MCH: 29.8 pg (ref 26.0–34.0)
MCHC: 32.7 g/dL (ref 30.0–36.0)
MCV: 91.1 fL (ref 80.0–100.0)
Monocytes Absolute: 0.5 10*3/uL (ref 0.1–1.0)
Monocytes Relative: 10 %
Neutro Abs: 2.8 10*3/uL (ref 1.7–7.7)
Neutrophils Relative %: 53 %
Platelets: 219 10*3/uL (ref 150–400)
RBC: 4.06 MIL/uL (ref 3.87–5.11)
RDW: 13.9 % (ref 11.5–15.5)
WBC: 5.3 10*3/uL (ref 4.0–10.5)
nRBC: 0 % (ref 0.0–0.2)

## 2022-03-11 LAB — COMPREHENSIVE METABOLIC PANEL
ALT: 34 U/L (ref 0–44)
AST: 23 U/L (ref 15–41)
Albumin: 3.6 g/dL (ref 3.5–5.0)
Alkaline Phosphatase: 66 U/L (ref 38–126)
Anion gap: 8 (ref 5–15)
BUN: 84 mg/dL — ABNORMAL HIGH (ref 6–20)
CO2: 27 mmol/L (ref 22–32)
Calcium: 11.8 mg/dL — ABNORMAL HIGH (ref 8.9–10.3)
Chloride: 102 mmol/L (ref 98–111)
Creatinine, Ser: 1.32 mg/dL — ABNORMAL HIGH (ref 0.44–1.00)
GFR, Estimated: 49 mL/min — ABNORMAL LOW (ref >60)
Glucose, Bld: 166 mg/dL — ABNORMAL HIGH (ref 70–99)
Potassium: 4.8 mmol/L (ref 3.5–5.1)
Sodium: 137 mmol/L (ref 135–145)
Total Bilirubin: 0.4 mg/dL (ref 0.3–1.2)
Total Protein: 7.8 g/dL (ref 6.5–8.1)

## 2022-03-11 LAB — GLUCOSE, CAPILLARY
Glucose-Capillary: 163 mg/dL — ABNORMAL HIGH (ref 70–99)
Glucose-Capillary: 125 mg/dL — ABNORMAL HIGH (ref 70–99)
Glucose-Capillary: 175 mg/dL — ABNORMAL HIGH (ref 70–99)
Glucose-Capillary: 120 mg/dL — ABNORMAL HIGH (ref 70–99)
Glucose-Capillary: 172 mg/dL — ABNORMAL HIGH (ref 70–99)
Glucose-Capillary: 150 mg/dL — ABNORMAL HIGH (ref 70–99)

## 2022-03-11 NOTE — Progress Notes (Signed)
When this RN went to reassess pt's pain the pt was audibly wheezing and trying to clear secretions from her trach. When going to suction this RN noticed that the trach had been pulled out and was laying again the pt's neck instead of in place. The obturator was placed, help called for and then the trach was placed back into place. Pt's SpO2 dropped as low as 75%, but quickly climbed back into the 90's. No lasting symptoms noted. RT was called and MD made aware. ?

## 2022-03-11 NOTE — Progress Notes (Signed)
RT called due to pt trach being out. Upon arrival RN at had reinserted trach and suctioned pt. EtCO2 + , BBS equal SPO2 98% on distress noted at this time. Pt on 28% ATC tolerating well.  ?

## 2022-03-11 NOTE — Progress Notes (Signed)
? ? ? [facility] ?                                                                            ? ? ?Sarah Myers, is a 50 y.o. female, DOB - 10/14/1971, MRN:[id number] ?Admit date - 02/05/2022    ?Outpatient Primary MD for the patient is Wilson, Amelia, MD ? ?LOS - 34  days ? ? ? ?Brief summary  ? ?50-year-old with history of hypertension presented to the emergency room with left-sided weakness and slurred speech.  She was found to have dense left hemiplegia and intracranial hemorrhage. ?2/19, admitted to intensive care unit with left-sided dense hemiplegia and slurred speech and found to have right intracranial hemorrhage with brain compression present on admission.  Treated with 3% saline, Cleviprex and intubated.  She failed intubation x2 and underwent tracheostomy on 2/28 by PCCM. ?2/28, tracheostomy. ?3/1 trach collar, off ventilator on 3/4. ?Transferred to medical floor on 3/6.  Cuffless trach on 3/16.  PEG tube placed on 3/20. ?Hospital course complicated by MSSA pneumonia, completed antibiotic therapy.   ? pt is opening her eyes to verbal cues. No distress noted.  ? ? ?Assessment & Plan  ? ? 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  Normal ejection fraction. ?CT head 3/8, expected regression of right ICH. ?CT head 3/12 continued expected interval evolution of intraparenchymal hematoma ?3/24 continue Klonopin 0.5 mg BID for 72 hours then decrease to daily at bedtime.  No changes in meds today.  ?No new complaints.  ? ?Counseling regarding goals of care ?High risk of rehemorrhage on anticoagulation.   ?Palliative care, family meeting 3/16.  She is very high risk of decompensation, she is very high risk of getting another bleeding.  Family requested full scope of treatment ?At this time.  Since patient  becomes very uncomfortable at night, we decided to keep on current doses of medications including Seroquel and pain medications. ? 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  ?SLP working diligently with patient as well ? ? ? ?Tracheostomy dependent (HCC) ?2/28, tracheostomy ?3/1, trach collar. ?3/16, downsized to cuffless #4 trach.  Currently on trach collar with minimum oxygen. ? ?Bilateral pulmonary embolism (HCC) ?Detected on CT angiogram 3/7.  ?Cleared for anticoagulation by neurology/Dr.Xu on 3/8  ?Patient was initially on heparin, then transitioned to Eliquis.   ? ? ? 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 Subcutaneous fat may be visible but bone, tendon or muscle are NOT exposed. (Active)  ?Date First Assessed/Time First Assessed: 02/26/22 0443   Location: Cervical  Location Orientation: Left    Staging: Stage 3 -  Full thickness tissue loss. Subcutaneous fat may be visible but bone, tendon or muscle are NOT exposed.  Present on Admission: No  ?  ?Assessments 02/26/2022  4:44 AM 03/09/2022  8:00 AM  ?Dressing Type -- Foam - Lift dressing to assess site every shift  ?Dressing Clean, Dry, Intact Reinforced  ?Dressing Change Frequency -- Every 3 days  ?Peri-wound Assessment Erythema (blanchable) --  ?Margins Unattached edges (unapproximated) Unattached edges (unapproximated)  ?Drainage Amount None --  ?   ?No Linked orders to display  ? ? ?Iron deficiency anemia ?Current hemoglobin stable at 11.5 ?Consider regular  per tube iron replacement ? ?Malignant hypertension ?Presented with hypertensive emergency ?BP well controlled on Norvasc, carvedilol, Cozaar and Catapres ?No changes in meds today.  ? ? ? ? ?  ? ? ?RN Pressure Injury Documentation: ?Pressure Injury 02/26/22 Cervical Left Stage 3 -  Full thickness tissue loss. Subcutaneous fat may be visible but bone, tendon or muscle are NOT exposed. (Active)  ?02/26/22 0443  ?Location: Cervical  ?Location Orientation: Left  ?Staging: Stage 3 -  Full thickness tissue loss. Subcutaneous fat may be visible but bone, tendon or muscle are NOT exposed.  ?Wound Description (Comments):   ?Present on Admission: No  ?Dressing Type Foam - Lift dressing to assess site every shift 03/10/22 0812  ? ? ?Malnutrition Type: ? ?Nutrition Problem: Inadequate oral intake ?Etiology: inability to eat ? ? ?Malnutrition Characteristics: ? ?Signs/Symptoms: NPO status ? ? ?Nutrition Interventions: ? ?Interventions: Tube feeding, Prostat ? ?Estimated body mass index is 27.17 kg/m? as calculated from the following: ?  Height as of this encounter: 5' 4" (1.626 m). ?  Weight as of this encounter: 71.8 kg. ? ?Code Status: full code.  ?DVT Prophylaxis:  SCD's Start: 02/05/22 0846 ?apixaban (ELIQUIS) tablet 5 mg  ? ?Level of Care: Level of care: Med-Surg ?Family Communication: none at bedside.  ? ?Disposition Plan:     Remains inpatient appropriate:  SNF. ? ?Procedures:  ?None.  ? ?Consultants:   ?None.  ? ?Antimicrobials:  ? ?Anti-infectives (From admission, onward)  ? ? Start     Dose/Rate Route Frequency Ordered Stop  ? 03/06/22 0700  ceFAZolin (ANCEF) IVPB 2g/100 mL premix       ? 2 g ?200 mL/hr over 30 Minutes Intravenous To Radiology 03/06/22 0607 03/06/22 1212  ? 03/05/22 1030  fluconazole (DIFLUCAN) tablet 200 mg       ? 200 mg Oral  Once 03/05/22 0930 03/05/22 1104  ? 02/22/22 2200  ceFAZolin (ANCEF) IVPB 2g/100 mL premix       ? 2 g ?200 mL/hr over 30 Minutes Intravenous Every 8 hours 02/22/22 1101  03/02/22 1537  ? 02/22/22 1100  vancomycin (VANCOREADY) IVPB 1250 mg/250 mL  Status:  Discontinued       ? 1,250 mg ?166.7 mL/hr over 90 Minutes Intravenous Every 24 hours 02/21/22 1150 02/22/22 1059  ? 02/21/22 1800  azithromycin (ZITHROMAX) 500 mg in sodium chloride 0.9 % 250 mL IVPB  Status:  Discontinued       ? 500 mg ?250 mL/hr over 60 Minutes Intravenous Every 24 hours 02/21/22 1701 02/22/22 1059  ? 02/21/22 1000  vancomycin (VANCOREADY) IVPB 1750 mg/350 mL       ? 1,750 mg ?175 mL/hr over 120 Minutes Intravenous  Once 02/21/22 0900 02/21/22 1246  ? 02/21/22 1000  ceFEPIme (MAXIPIME) 2 g in sodium chloride 0.9 % 100 mL IVPB  Status:  Discontinued       ?  2 g ?200 mL/hr over 30 Minutes Intravenous Every 12 hours 02/21/22 0903 02/22/22 1059  ? 02/21/22 0945  cefTRIAXone (ROCEPHIN) 2 g in sodium chloride 0.9 % 100 mL IVPB  Status:  Discontinued       ? 2 g ?200 mL/hr over 30 Minutes Intravenous Every 24 hours 02/21/22 0850 02/21/22 0903  ? 02/14/22 0945  ceFEPIme (MAXIPIME) 2 g in sodium chloride 0.9 % 100 mL IVPB       ? 2 g ?200 mL/hr over 30 Minutes Intravenous Every 8 hours 02/14/22 0845 02/16/22 0713  ? 02/10/22 1400  vancomycin (VANCOCIN) IVPB 1000 mg/200 mL premix  Status:  Discontinued       ? 1,000 mg ?200 mL/hr over 60 Minutes Intravenous Every 24 hours 02/09/22 1214 02/10/22 0808  ? 02/09/22 1300  vancomycin (VANCOREADY) IVPB 1500 mg/300 mL       ? 1,500 mg ?150 mL/hr over 120 Minutes Intravenous  Once 02/09/22 1214 02/09/22 1608  ? 02/09/22 1245  ceFEPIme (MAXIPIME) 2 g in sodium chloride 0.9 % 100 mL IVPB  Status:  Discontinued       ? 2 g ?200 mL/hr over 30 Minutes Intravenous Every 12 hours 02/09/22 1145 02/14/22 0845  ? ?  ? ? ? ?Medications ? ?Scheduled Meds: ? amantadine  50 mg Per Tube BID  ? amLODipine  10 mg Per Tube Daily  ? apixaban  5 mg Per Tube BID  ? carvedilol  25 mg Per Tube BID WC  ? chlorhexidine gluconate (MEDLINE KIT)  15 mL Mouth Rinse BID  ? clonazePAM  0.5 mg Per Tube BID  ?  feeding supplement (PROSource TF)  45 mL Per Tube BID  ? free water  200 mL Per Tube Q8H  ? guaiFENesin  200 mg Per Tube Q6H  ? insulin aspart  0-15 Units Subcutaneous Q4H  ? mouth rinse  15 mL Mouth Rinse 10 ti

## 2022-03-12 LAB — GLUCOSE, CAPILLARY
Glucose-Capillary: 152 mg/dL — ABNORMAL HIGH (ref 70–99)
Glucose-Capillary: 140 mg/dL — ABNORMAL HIGH (ref 70–99)
Glucose-Capillary: 153 mg/dL — ABNORMAL HIGH (ref 70–99)
Glucose-Capillary: 152 mg/dL — ABNORMAL HIGH (ref 70–99)
Glucose-Capillary: 174 mg/dL — ABNORMAL HIGH (ref 70–99)
Glucose-Capillary: 120 mg/dL — ABNORMAL HIGH (ref 70–99)

## 2022-03-12 MED ORDER — BACITRACIN-NEOMYCIN-POLYMYXIN OINTMENT TUBE
TOPICAL_OINTMENT | Freq: Two times a day (BID) | CUTANEOUS | Status: DC
  Administered 2022-03-12 – 2022-03-21 (×4): 1 via TOPICAL
  Filled 2022-03-12 (×2): qty 14

## 2022-03-12 MED ORDER — FLUCONAZOLE 100 MG PO TABS
100.0000 mg | ORAL_TABLET | Freq: Every day | ORAL | Status: AC
  Administered 2022-03-12 – 2022-03-18 (×7): 100 mg
  Filled 2022-03-12 (×7): qty 1

## 2022-03-12 NOTE — Progress Notes (Signed)
? ? ? [facility] ?                                                                            ? ? ?Sarah Myers, is a 51 y.o. female, DOB - 07-25-1971, [id number] ?Admit date - 02/05/2022    ?Outpatient Primary MD for the patient is [name], MD ? ?LOS - 35  days ? ? ? ?Brief summary  ? ?51 year old with history of hypertension presented to the emergency room with left-sided weakness and slurred speech.  She was found to have dense left hemiplegia and intracranial hemorrhage. ?2/19, admitted to intensive care unit with left-sided dense hemiplegia and slurred speech and found to have right intracranial hemorrhage with brain compression present on admission.  Treated with 3% saline, Cleviprex and intubated.  She failed intubation x2 and underwent tracheostomy on 2/28 by PCCM. ?2/28, tracheostomy. ?3/1 trach collar, off ventilator on 3/4. ?Transferred to medical floor on 3/6.  Cuffless trach on 3/16.  PEG tube placed on 3/20. ?Hospital course complicated by MSSA pneumonia, completed antibiotic therapy.   ?Oral Thrush. She is alert and following simple commands, saying yes and NO.  ? ? ? ?Assessment & Plan  ? ? 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  Normal ejection fraction. ?CT head 3/8, expected regression of right ICH. ?CT head 3/12 continued expected interval evolution of intraparenchymal hematoma ?3/24 continue Klonopin 0.5 mg BID for 72 hours then decrease to daily at bedtime.  No changes in meds today.  ?No new complaints.  ? ?Counseling regarding goals of care ?High risk of rehemorrhage on anticoagulation.   ?Palliative care, family meeting 3/16.  She is very high risk of decompensation, she is very high risk of getting another bleeding.  Family requested full scope of treatment ?At this time.   Since patient becomes very uncomfortable at night, we decided to keep on current doses of medications including Seroquel and pain medications. ? 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  ?SLP working diligently with patient as well ? ? ? ?Tracheostomy dependent ([place]) ?2/28, tracheostomy ?3/1, trach collar. ?3/16, downsized to cuffless #4 trach.  Currently on trach collar with minimum oxygen. ?Pt has pulled out her trach twice last night, will request PCCM to see the patient in am.  ? ?Bilateral pulmonary embolism ([place]) ?Detected on CT angiogram 3/7.  ?Cleared for anticoagulation by neurology/Dr.Xu on 3/8  ?Patient was initially on heparin, then transitioned to Eliquis.   ? ? ? 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 Subcutaneous fat may be visible but bone, tendon or muscle  are NOT exposed. (Active)  ?Date First Assessed/Time First Assessed: 02/26/22 0443   Location: Cervical  Location Orientation: Left  Staging: Stage 3 -  Full thickness tissue loss. Subcutaneous fat may be visible but bone, tendon or muscle are NOT exposed.  Present on Admission: No  ?  ?Assessments 02/26/2022  4:44 AM 03/09/2022  8:00 AM  ?Dressing Type -- Foam - Lift dressing to assess site every shift  ?Dressing Clean, Dry, Intact Reinforced  ?Dressing Change Frequency -- Every 3 days  ?Peri-wound Assessment Erythema (blanchable) --  ?Margins Unattached edges (unapproximated) Unattached edges (unapproximated)  ?Drainage Amount None --  ?   ?No  Linked orders to display  ? ? ?Iron deficiency anemia ?Current hemoglobin stable at 11.5 ?Consider regular per tube iron replacement ? ?Malignant hypertension ?Presented with hypertensive emergency ?BP well controlled on Norvasc, carvedilol, Cozaar and Catapres ?No changes in meds today.  ? ? ? ?PEG site area redness and tenderness:  ?- from compression of the circular disc, does not appear to be infected,. ?- relieve the compression.  ?- antibiotic ointment.  ?- will watch her in the next 24 hours.  ? ? ?Oral thrush:  ?Will start her on fluconazole for 7 days.  ? ? ?  ? ? ?RN Pressure Injury Documentation: ?Pressure Injury 02/26/22 Cervical Left Stage 3 -  Full thickness tissue loss. Subcutaneous fat may be visible but bone, tendon or muscle are NOT exposed. (Active)  ?02/26/22 0443  ?Location: Cervical  ?Location Orientation: Left  ?Staging: Stage 3 -  Full thickness tissue loss. Subcutaneous fat may be visible but bone, tendon or muscle are NOT exposed.  ?Wound Description (Comments):   ?Present on Admission: No  ?Dressing Type Foam - Lift dressing to assess site every shift 03/11/22 2000  ? ? ?Malnutrition Type: ? ?Nutrition Problem: Inadequate oral intake ?Etiology: inability to eat ? ? ?Malnutrition Characteristics: ? ?Signs/Symptoms: NPO status ? ? ?Nutrition Interventions: ? ?Interventions: Tube feeding, Prostat ? ?Estimated body mass index is 25.85 kg/m? as calculated from the following: ?  Height as of this encounter: 5\' 4"  (1.626 m). ?  Weight as of this encounter: 68.3 kg. ? ?Code Status: full code.  ?DVT Prophylaxis:  SCD's Start: 02/05/22 0846 ?apixaban (ELIQUIS) tablet 5 mg  ? ?Level of Care: Level of care: Med-Surg ?Family Communication: none at bedside.  ? ?Disposition Plan:     Remains inpatient appropriate:  SNF. ? ?Procedures:  ?None.  ? ?Consultants:   ?None.  ? ?Antimicrobials:  ? ?Anti-infectives (From admission, onward)  ? ? Start     Dose/Rate Route Frequency Ordered Stop  ? 03/12/22 1330   fluconazole (DIFLUCAN) tablet 100 mg       ? 100 mg Per Tube Daily 03/12/22 1238 03/19/22 0959  ? 03/06/22 0700  ceFAZolin (ANCEF) IVPB 2g/100 mL premix       ? 2 g ?200 mL/hr over 30 Minutes Intravenous To Radiology 03/06/22 0607 03/06/22 1212  ? 03/05/22 1030  fluconazole (DIFLUCAN) tablet 200 mg       ? 200 mg Oral  Once 03/05/22 0930 03/05/22 1104  ? 02/22/22 2200  ceFAZolin (ANCEF) IVPB 2g/100 mL premix       ? 2 g ?200 mL/hr over 30 Minutes Intravenous Every 8 hours 02/22/22 1101 03/02/22 1537  ? 02/22/22 1100  vancomycin (VANCOREADY) IVPB 1250 mg/250 mL  Status:  Discontinued       ? 1,250 mg ?166.7 mL/hr over 90 Minutes Intravenous Every 24 hours 02/21/22 1150  02/22/22 1059  ? 02/21/22 1800  azithromycin (ZITHROMAX) 500 mg in sodium chloride 0.9 % 250 mL IVPB  Status:  Discontinued       ? 500 mg ?250 mL/hr over 60 Minutes Intravenous Every 24 hours 02/21/22 1701 02/22/22 1059  ? 02/21/22 1000  vancomycin (VANCOREADY) IVPB 1750 mg/350 mL       ? 1,750 mg ?175 mL/hr over 120 Minutes Intravenous  Once 02/21/22 0900 02/21/22 1246  ? 02/21/22 1000  ceFEPIme (MAXIPIME) 2 g in sodium chloride 0.9 % 100 mL IVPB  Status:  Discontinued       ? 2 g ?200 mL/hr over 30 Minutes Intravenous Every 12 hours 02/21/22 0903 02/22/22 1059  ? 02/21/22 0945  cefTRIAXone (ROCEPHIN) 2 g in sodium chloride 0.9 % 100 mL IVPB  Status:  Discontinued       ? 2 g ?200 mL/hr over 30 Minutes Intravenous Every 24 hours 02/21/22 0850 02/21/22 0903  ? 02/14/22 0945  ceFEPIme (MAXIPIME) 2 g in sodium chloride 0.9 % 100 mL IVPB       ? 2 g ?200 mL/hr over 30 Minutes Intravenous Every 8 hours 02/14/22 0845 02/16/22 0713  ? 02/10/22 1400  vancomycin (VANCOCIN) IVPB 1000 mg/200 mL premix  Status:  Discontinued       ? 1,000 mg ?200 mL/hr over 60 Minutes Intravenous Every 24 hours 02/09/22 1214 02/10/22 0808  ? 02/09/22 1300  vancomycin (VANCOREADY) IVPB 1500 mg/300 mL       ? 1,500 mg ?150 mL/hr over 120 Minutes Intravenous  Once 02/09/22 1214  02/09/22 1608  ? 02/09/22 1245  ceFEPIme (MAXIPIME) 2 g in sodium chloride 0.9 % 100 mL IVPB  Status:  Discontinued       ? 2 g ?200 mL/hr over 30 Minutes Intravenous Every 12 hours 02/09/22 1145 02/14/22

## 2022-03-12 NOTE — Procedures (Signed)
RRT called due to pt's trach being missing, SATs 78% on arrival. 4.0 Shiley flexible cuffless trach reinserted with no issues. Positive CO2 color change, SATs recovered quickly. ? ?Tracheostomy Change Note ? ?Patient Details:   ?Name: Sarah Myers ?DOB: 10/11/1971 ?MRN: [id number] ?   ?Airway Documentation: ?   ? ?Evaluation ? O2 sats:  See top of note. ?Complications: None during reinsertion. ?Patient did tolerate procedure well. ?Bilateral Breath Sounds: Clear, Diminished ?  ? ?Dewain Penning T ?03/12/2022, 10:07 AM ?

## 2022-03-13 DIAGNOSIS — I619 Nontraumatic intracerebral hemorrhage, unspecified: Secondary | ICD-10-CM | POA: Diagnosis not present

## 2022-03-13 DIAGNOSIS — B370 Candidal stomatitis: Secondary | ICD-10-CM

## 2022-03-13 LAB — BASIC METABOLIC PANEL
Anion gap: 10 (ref 5–15)
BUN: 61 mg/dL — ABNORMAL HIGH (ref 6–20)
CO2: 25 mmol/L (ref 22–32)
Calcium: 11.9 mg/dL — ABNORMAL HIGH (ref 8.9–10.3)
Chloride: 106 mmol/L (ref 98–111)
Creatinine, Ser: 1.06 mg/dL — ABNORMAL HIGH (ref 0.44–1.00)
GFR, Estimated: >60 mL/min (ref >60)
Glucose, Bld: 133 mg/dL — ABNORMAL HIGH (ref 70–99)
Potassium: 4.1 mmol/L (ref 3.5–5.1)
Sodium: 141 mmol/L (ref 135–145)

## 2022-03-13 LAB — GLUCOSE, CAPILLARY
Glucose-Capillary: 119 mg/dL — ABNORMAL HIGH (ref 70–99)
Glucose-Capillary: 123 mg/dL — ABNORMAL HIGH (ref 70–99)
Glucose-Capillary: 130 mg/dL — ABNORMAL HIGH (ref 70–99)
Glucose-Capillary: 132 mg/dL — ABNORMAL HIGH (ref 70–99)
Glucose-Capillary: 176 mg/dL — ABNORMAL HIGH (ref 70–99)
Glucose-Capillary: 184 mg/dL — ABNORMAL HIGH (ref 70–99)

## 2022-03-13 MED ORDER — CLONAZEPAM 0.5 MG PO TABS
0.5000 mg | ORAL_TABLET | Freq: Every day | ORAL | Status: DC
  Administered 2022-03-13 – 2022-03-21 (×9): 0.5 mg
  Filled 2022-03-13 (×9): qty 1

## 2022-03-13 NOTE — Plan of Care (Signed)
?  Problem: Education: ?Goal: Knowledge of General Education information will improve ?Description: Including pain rating scale, medication(s)/side effects and non-pharmacologic comfort measures ?Outcome: Not Progressing ?  ?Problem: Health Behavior/Discharge Planning: ?Goal: Ability to manage health-related needs will improve ?Outcome: Not Progressing ?  ?Problem: Clinical Measurements: ?Goal: Ability to maintain clinical measurements within normal limits will improve ?Outcome: Progressing ?Goal: Will remain free from infection ?Outcome: Progressing ?Goal: Diagnostic test results will improve ?Outcome: Progressing ?Goal: Respiratory complications will improve ?Outcome: Progressing ?  ?Problem: Activity: ?Goal: Risk for activity intolerance will decrease ?Outcome: Not Progressing ?  ?Problem: Coping: ?Goal: Level of anxiety will decrease ?Outcome: Progressing ?  ?Problem: Elimination: ?Goal: Will not experience complications related to bowel motility ?Outcome: Progressing ?Goal: Will not experience complications related to urinary retention ?Outcome: Progressing ?  ?Problem: Pain Managment: ?Goal: General experience of comfort will improve ?Outcome: Progressing ?  ?Problem: Safety: ?Goal: Ability to remain free from injury will improve ?Outcome: Progressing ?  ?Problem: Skin Integrity: ?Goal: Risk for impaired skin integrity will decrease ?Outcome: Progressing ?  ?Problem: Education: ?Goal: Knowledge of disease or condition will improve ?Outcome: Not Met (add Reason) pt is not ready for education, she is alert only to self  ?  ?

## 2022-03-13 NOTE — TOC Progression Note (Addendum)
Transition of Care (TOC) - Progression Note  ? ? ?Patient Details  ?Name: Sarah Myers ?MRN: [id number] ?Date of Birth: [birth year] ? ?Transition of Care (TOC) CM/SW Contact  ?Janae Bridgeman, RN ?Phone Number: ?03/13/2022, 1:44 PM ? ?Clinical Narrative:    ?CM noted that patient was downsized to 4 mm uncuffed trach after patient pulled her tracheostomy out this weekend and was unable to maintain her oxygen saturations.  I called and left a message with Mariella Saa, admissions director for [facility] facilities to check on bed availability under the patient's [employer]. ? ?I also called and spoke with Irving Burton, Admission at [facility] and asked that the facility review the patient for possible bed offer.  The facility declined the patient on Friday, 03/10/2022 and she states that she will have nursing review the patient again. ? ?I called and left a message with Milagros Reap, Admission Director with [facility] facilities to request review of clinicals for bed offer. ? ?CM and MSW with DTP Team will continue to follow the patient for SNF placement. ? ? ?Expected Discharge Plan: Skilled Nursing Facility ?Barriers to Discharge: Continued Medical Work up ? ?Expected Discharge Plan and Services ?Expected Discharge Plan: Skilled Nursing Facility ?In-house Referral: Clinical Social Work, Artist ?Discharge Planning Services: CM Consult ?Post Acute Care Choice: Skilled Nursing Facility ?Living arrangements for the past 2 months: Apartment ?                ?  ?  ?  ?  ?  ?  ?  ?  ?  ?  ? ? ?Social Determinants of Health (SDOH) Interventions ?  ? ?Readmission Risk Interventions ? ?  03/09/2022  ?  9:54 AM  ?Readmission Risk Prevention Plan  ?Transportation Screening Complete  ?PCP or Specialist Appt within 3-5 Days Complete  ?HRI or Home Care Consult Not Complete  ?Social Work Consult for Recovery Care Planning/Counseling Complete  ?Medication Review Oceanographer) Complete  ? ? ?

## 2022-03-13 NOTE — Progress Notes (Signed)
?Progress Note ? ? ?Patient: Sarah Myers [id number] DOB: 08-15-1971 DOA: 02/05/2022     36 ?DOS: the patient was seen and examined on 03/13/2022 ?  ?Brief hospital course: ?51 year old with history of hypertension presented to the emergency room with left-sided weakness and slurred speech.  She was found to have dense left hemiplegia and intracranial hemorrhage. ?2/19, admitted to intensive care unit with left-sided dense hemiplegia and slurred speech and found to have right intracranial hemorrhage with brain compression present on admission.  Treated with 3% saline, Cleviprex and intubated.  She failed intubation x2 and underwent tracheostomy on 2/28 by PCCM. ?2/28, tracheostomy. ?3/1 trach collar, off ventilator on 3/4. ?Transferred to medical floor on 3/6.  Cuffless trach on 3/16.  PEG tube placed on 3/20. ?Hospital course complicated by MSSA pneumonia, completed antibiotic therapy.   ?  ? 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  Normal ejection fraction. ?CT head 3/8, expected regression of right ICH. ?CT head 3/12 continued expected interval evolution of intraparenchymal hematoma ?Continue weaning of Klonopin.  Will decrease to 0.5 mg at HS ?Has tolerated transition from Seroquel to low-dose Zyprexa at night.  She is now much more alert, communicative following commands and very interactive. ?Continue Robaxin for hypertonicity since is less sedating than baclofen or tizanidine ?Continue Symmetrel  ?It is hopeful that now she is more alert and interactive she will be able to participate more effectively with PT and OT ? 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 ?Tracheostomy dependent ([place]) ?2/28, tracheostomy ?3/1,  trach collar. ?3/16, downsized to cuffless #4 trach.  Currently on trach collar with minimum oxygen. ?Patient pulled out trach over the weekend but did not tolerate noting sats down to 78% ? 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 ? ? ?Bilateral pulmonary embolism (HCC) ?Detected on CT angiogram 3/7.  ?Cleared for anticoagulation by neurology/Dr.Xu on 3/8  ?Patient was initially on heparin, then transitioned to Eliquis.   ?Currently has issues with abdominal distention of unclear etiology therefore all medications and tube feeding have been stopped per tube.  Discussed with pharmacy who recommended IV heparin as opposed to Lovenox. ? ? ?Malignant hypertension ?Presented with hypertensive emergency ?BP well controlled on Norvasc, carvedilol, Cozaar and Catapres ?Has as needed IV hydralazine as well as labetalol available ? ?Counseling regarding goals of care ?High risk of rehemorrhage on anticoagulation.   ?Palliative care, family meeting 3/16.  She is very high risk of decompensation, she is very high risk of getting another bleeding.  Family requested full scope of treatment ?3/21 patient's daughter [name] at the bedside.  Given updates about brain hemorrhage, massive pulmonary embolism on blood thinners and challenging treatment options previous attending physician.  All questions were answered.  Since patient becomes very uncomfortable at night, we  decided to keep on current doses of medications including Seroquel  and pain medications. ? ?Iron deficiency anemia ?Current hemoglobin stable at 11.5 ?Consider regular per tube iron replacement ? 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 Subcutaneous fat may be visible but bone, tendon or muscle are NOT exposed. (Active)  ?Date First Assessed/Time First Assessed: 02/26/22 0443   Location: Cervical  Location Orientation: Left  Staging: Stage 3 -  Full thickness tissue loss. Subcutaneous fat may be visible but bone, tendon or muscle are NOT exposed.  Present on Admission: No  ?  ?Assessments 02/26/2022  4:44 AM 03/09/2022  8:00 AM  ?Dressing Type -- Foam - Lift dressing to assess site every shift  ?Dressing Clean, Dry, Intact Reinforced  ?Dressing Change Frequency -- Every 3 days  ?Peri-wound Assessment Erythema (blanchable) --  ?Margins Unattached edges (unapproximated) Unattached edges (unapproximated)  ?Drainage Amount None --  ?   ?No Linked orders to display  ? ? ? ? ? ?  ? ?Subjective:  ?Patient quite alert and very verbal.  She repeatedly is asking to go "poop poop" family member at bedside. ? ?Physical Exam: ?Vitals:  ? 03/13/22 0922 03/13/22 1020 03/13/22 1122 03/13/22 1131  ?BP: (!) 161/81 (!) 150/86 (!) 146/79   ?Pulse: 66  60 61  ?Resp: 20  20 20   ?Temp: 99 ?F (37.2 ?C)  98 ?F (36.7 ?C)   ?TempSrc: Oral  Axillary   ?SpO2: 100%   100%  ?Weight:      ?Height:      ? ?Constitutional: Awake and alert and mildly restless noting she continues to asked to go to the bathroom ?Respiratory: Lung sounds are clear to auscultation, has trach in place 4.0 cuffless to trach collar, stable on 5 L O2 ?Cardiovascular: Regular rate and rhythm, No extremity edema.  Normotensive ?Abdomen: + tenderness when palpating directly over PEG site, Bowel sounds positive.  LBM 3/25 ?Musculoskeletal: no clubbing / cyanosis.  Generalized hypertonic muscle tone has significantly improved ?Neurologic: CN 2-12 appears to be grossly intact.  Sensation appears to be grossly intact especially on the right.  Quickly follow simple commands with right upper extremity.  Unable to move right lower extremity as well.  Noted with apparent hyperesthesias on left upper extremity no movement left lower extremity ?Psychiatric: Awake and very alert.  Oriented to name only.  Able to speak but at times speech is difficult to understand.  She does state that she is thirsty and is requesting iced tea. ? ?Data Reviewed: ?CBC reviewed for today ? ?Family Communication:  ?Updated dtr Berdine Dance ? ?Disposition: ?Remains inpatient appropriate because:  ?Requires skilled nursing level of care due to tracheostomy tube, unresponsive state and ongoing need for tube feedings.  She is custodial care. ? ?Planned Discharge Destination:  ?Skilled nursing facility ? ?Medically stable ?Yes ? ?COVID vaccination status:  ?unknown ? ?Consultants: ?Neurosurgery ?PCCM ?Palliative medicine ?Internal radiology ?Procedures: ?Intubation ?Echocardiogram ?Follow-up echocardiogram with bubble study ?Cortrack tube ?Bedside tracheostomy tube placement ?PEG tube placement ?Antibiotics: ?IV vancomycin x1 dose on 2/23 ?Cefepime 2/23 through 3/1 ?Azithromycin 3/7 ?Cefepime 3/7, 3/8 ?Vancomycin 3/7 ?Cefazolin 3/8 through 3/16 ?Fluconazole 3/19 ?Cefazolin 3/20 ? ? ? ? ?Time spent: 45 minutes ? ?Author: ?Erin Hearing, NP ?03/13/2022 11:56 AM ? ?For on call review [email or web address].  ?

## 2022-03-13 NOTE — Plan of Care (Signed)
?  Problem: Education: ?Goal: Knowledge of General Education information will improve ?Description: Including pain rating scale, medication(s)/side effects and non-pharmacologic comfort measures ?03/13/2022 0742 by Tanja Port, RN ?Outcome: Not Progressing ?03/13/2022 0617 by Tanja Port, RN ?Outcome: Not Progressing ?  ?Problem: Health Behavior/Discharge Planning: ?Goal: Ability to manage health-related needs will improve ?03/13/2022 0742 by Tanja Port, RN ?Outcome: Not Progressing ?03/13/2022 0617 by Tanja Port, RN ?Outcome: Not Progressing ?  ?Problem: Clinical Measurements: ?Goal: Ability to maintain clinical measurements within normal limits will improve ?03/13/2022 0742 by Tanja Port, RN ?Outcome: Progressing ?03/13/2022 0617 by Tanja Port, RN ?Outcome: Progressing ?Goal: Will remain free from infection ?03/13/2022 0742 by Tanja Port, RN ?Outcome: Progressing ?03/13/2022 0617 by Tanja Port, RN ?Outcome: Progressing ?Goal: Diagnostic test results will improve ?03/13/2022 0742 by Tanja Port, RN ?Outcome: Progressing ?03/13/2022 0617 by Tanja Port, RN ?Outcome: Progressing ?Goal: Respiratory complications will improve ?03/13/2022 0742 by Tanja Port, RN ?Outcome: Progressing ?03/13/2022 0617 by Tanja Port, RN ?Outcome: Progressing ?Goal: Cardiovascular complication will be avoided ?Outcome: Progressing ?  ?Problem: Activity: ?Goal: Risk for activity intolerance will decrease ?03/13/2022 0742 by Tanja Port, RN ?Outcome: Not Progressing ?03/13/2022 0617 by Tanja Port, RN ?Outcome: Not Progressing ?  ?Problem: Coping: ?Goal: Level of anxiety will decrease ?03/13/2022 0742 by Tanja Port, RN ?Outcome: Progressing ?03/13/2022 0617 by Tanja Port, RN ?Outcome: Progressing ?  ?Problem: Elimination: ?Goal: Will not experience complications related to bowel motility ?03/13/2022 0742 by Tanja Port, RN ?Outcome: Progressing ?03/13/2022 0617 by Tanja Port, RN ?Outcome: Progressing ?Goal: Will not experience complications related  to urinary retention ?03/13/2022 0742 by Tanja Port, RN ?Outcome: Progressing ?03/13/2022 0617 by Tanja Port, RN ?Outcome: Progressing ?  ?Problem: Pain Managment: ?Goal: General experience of comfort will improve ?03/13/2022 0742 by Tanja Port, RN ?Outcome: Progressing ?03/13/2022 0617 by Tanja Port, RN ?Outcome: Progressing ?  ?Problem: Safety: ?Goal: Ability to remain free from injury will improve ?03/13/2022 0742 by Tanja Port, RN ?Outcome: Progressing ?03/13/2022 0617 by Tanja Port, RN ?Outcome: Progressing ?  ?Problem: Skin Integrity: ?Goal: Risk for impaired skin integrity will decrease ?03/13/2022 0742 by Tanja Port, RN ?Outcome: Progressing ?03/13/2022 0617 by Tanja Port, RN ?Outcome: Progressing ?  ?

## 2022-03-13 NOTE — Assessment & Plan Note (Signed)
Continue Diflucan-initiated 3/26-change to nystatin when allowed POs ?

## 2022-03-13 NOTE — Progress Notes (Addendum)
Nutrition Follow-up ? ?DOCUMENTATION CODES:  ?Not applicable ? ?INTERVENTION:  ?Continue TF via PEG:  ?-Jevity 1.5 @ 55 ml/h (1320 ml per day) ?-ProSource TF to 45 ml BID ?-Free water per MD, currently 267m Q8H ?  ?Provides 2060 kcal, 105 gm protein, 1006 ml free water daily (16047mtotal free water) ?  ?-MVI daily per tube ?-1 packet Juven BID per tube, each packet provides 95 calories, 2.5 grams of protein (collagen), and 9.8 grams of carbohydrate (3 grams sugar); also contains 7 grams of L-arginine and L-glutamine, 300 mg vitamin C, 15 mg vitamin E, 1.2 mcg vitamin B-12, 9.5 mg zinc, 200 mg calcium, and 1.5 g  Calcium Beta-hydroxy-Beta-methylbutyrate to support wound healing ? ?NUTRITION DIAGNOSIS:  ?Inadequate oral intake related to inability to eat as evidenced by NPO status. ?Ongoing.  ? ?GOAL:  ?Patient will meet greater than or equal to 90% of their needs ?Met with TF at goal.  ? ?MONITOR:  ?TF tolerance, I & O's ? ?REASON FOR ASSESSMENT:  ?Consult ?Enteral/tube feeding initiation and management ? ?ASSESSMENT:  ?Pt with PMH of HTN admitted with acute R ICH  and midline shift. 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 ?  ?2/24- cortrak placed (gastric)  ?2/28- s/p trach ?3/01- trach collar  ?3/04- taken off vent ?3/06- tx to floor ?3/16- downsized to cuffless #6 trach   ?3/20- PEG placed ?3/26- trach displaced and she desatted to 78%, 4 cuff less shiley reinserted by RT ? ?Pt unavailable at time of RD visit. Remains on TC receiving TF via PEG. Current TF orders are Jevity 1.5 @ 5578mr, 53m59mosource TF BID and 200ml41me water Q8H. Toleratng TF per RN. Will consider transition to bolus TF provided pt continues to tolerate TF without issue.  ? ?UOP: 800ml 106mhours ?I/O: +2123ml s70m admit ? ?Admit weight 74.8 kg ?Current wt 68.3 kg ? ?Medications: SSI Q4H, mvi w/ minerals, juven BID, protonix, senokot-S ?Labs reviewed. ?CBGs: 119-184 x 24 hours ? ?Diet Order:   ?Diet Order   ? ?        ?  Diet NPO time specified  Diet effective now       ?  ? ?  ?  ? ?  ? ?EDUCATION NEEDS:  ?Not appropriate for education at this time ? ?Skin: stg III pressure injury noted to L cervical per RN assessment ? ?Last BM:  3/25 ? ?Height:  ?Ht Readings from Last 1 Encounters:  ?02/05/22 '5\' 4"'  (1.626 m)  ? ?Weight:  ?Wt Readings from Last 1 Encounters:  ?03/12/22 68.3 kg  ? ?BMI:  Body mass index is 25.85 kg/m?. ? ?Estimated Nutritional Needs:  ?Kcal:  1900-2100 ?Protein:  105-120 grams ?Fluid:  > 1.9 L ? ? ? ?Trebor Galdamez Theone StanleyRD, LDN (she/her/hers) ?RD pager number and weekend/on-call pager number located in Amion. [place]

## 2022-03-13 NOTE — Progress Notes (Signed)
? ?NAME:  Sarah Myers, MRN:  [id number], DOB:  1971/11/29, LOS: 36 ?ADMISSION DATE:  02/05/2022, CONSULTATION DATE:  2/19 ?REFERRING MD:  Selina Cooley - neuro, CHIEF COMPLAINT:  Left sided weakness, slurred speech  ? ?History of Present Illness:  ?51 y/o female admitted to [facility] on 2/19 with left sided weakness and slurred speech.  Work up consistent with  a right ICH with "brain compression" present on admission.  Required intubation for airway protection, 3% saline, & cleviprex.  Hospital course complicated by prolonged respiratory failure with failed extubations due to stridor requiring tracheostomy (2/28).  ? ?Pertinent  Medical History  ?Hypertension ? ?Significant Hospital Events: ?Including procedures, antibiotic start and stop dates in addition to other pertinent events   ?2/19 admitted to neuro with acute R ICH. Intubated for airway protection. Started on 3% and  cleviprex. PCCM consult. NSGY consult  ?02/06/2022 For repeat CT Head >> Overnight CT showed increase in size of bleed to 36 ml , but unchanged midline shift at 4 mm . Repeating CT head again 2/20 am. Neurosurgery is aware ?2/21 3% saline stopped due to Na 158. weaned off cleviprex. Remains intubated. ?2/22 neuro exam much improved (moves RUE and RLE to command; thumbs up; blinks eyes to command; sticks tongue out); sinus pause overnight w/ inferiolateral ST elevation overnight; troponin 387 then 269 ?2/23 failed extubation due to stridor. Reintubated ?2/24 Diuresis ?2/26 failed 2nd attempt at extubation. CT head ICH without rebleeding. Hematoma and edema causes 4 mm of midline shift. 2. Bilateral cribriform plate cephalocele. ?2/28 tracheostomy by Kendrick Fries, stopped versed infusion ?3/1 trach collar x1 hour ?3/2 worked with PT ?3/5 Off vent 24 hours  ?3/10 Mild hemoptysis from trach on heparin infusion ?03/01/2022 no overt hemoptysis noted remains on heparin infusion ?03/01/2022 changed to cuffless trach ?3/26 trach displaced and she desatted to 78%, 4  cuff less shiley reinserted by RT ? ?Interim History / Subjective:  ? ?Tracheostomy got displaced over the weekend and she did not tolerate it with desats to 78%.  Tracheostomy was replaced by RT with improvement in saturations. ? ?Objective   ?Blood pressure (!) 146/79, pulse 61, temperature 98 ?F (36.7 ?C), temperature source Axillary, resp. rate 20, height 5\' 4"  (1.626 m), weight 68.3 kg, SpO2 100 %. ?   ?FiO2 (%):  [28 %] 28 %  ?No intake or output data in the 24 hours ending 03/13/22 1225 ? ?Filed Weights  ? 03/06/22 0500 03/11/22 0423 03/12/22 0500  ?Weight: 75.9 kg 71.8 kg 68.3 kg  ? ? ?Examination: ?General appearance: 51 y.o., female, nad - after suctioning ?HENT: NCAT; MMM ?Neck: 4 cuffed shiley flex in place ?Lungs: CTAB, with normal respiratory effort ?CV: RRR, no murmur  ?Abdomen: Soft, non-tender; non-distended, BS present  ?Extremities: No peripheral edema, warm ? ?Resolved Hospital Problem list   ?MSSA/Serratia pneumonia, fever on 2/28 due to pneumonia  ?AKI   ?Iatrogenic hypernatremia from hypertonic saline given for brain swelling  ?Agitated Delirium  ?Hypertensive Emergency  ?Acute Cardiogenic Pulmonary Edema  ?Stridor due to upper airway edema, failed extubation x 2 ?Hemoptysis  ? ?Assessment & Plan:  ?  ?Acute Hypoxemic Respiratory Failure s/p Tracheostomy  ?Pulmonary Embolism  ?Trach placed 2/28 per B. McQuaid #6 cuffed, change to 4 cuffless shiley 3/16 and replaced 3/26  ?Continue routine trach care ?Monitor closely for further mucous plugging ? ?Right temporal hematoma with brain compression   ?Acute Encephalopathy in setting of Stroke  ?Per [facility] and neurology ? ? ?Best Practice (right click  and "Reselect all SmartList Selections" daily)  ?Per primary ? ?PCCM will continue to follow weekly. Please call sooner if new needs arise.  ? ?Critical care time: n/a minutes  ? ?Chilton Greathouse MD ?[facility] ?See Amion for pager ? ?If no response to pager , please call [phone]  [phone] until 7pm ?After 7:00 pm call Elink  [phone] ?03/13/2022, 12:26 PM  ? ? ? ? ? ? ? ? ? ? ? ?

## 2022-03-13 NOTE — Progress Notes (Addendum)
Patient HB:Sarah Myers      DOB: Dec 02, 1971      ELF:[id number] ? ? ? ?  ?Palliative Medicine Team ? ? ? ?Subjective: Bedside symptom check. Daughter bedside at time of visit. ? ? ?Physical exam: Patient alert and resting in bed at time of visit.. Patient fully interactive, holding eye contact, and speaking with daughter and this RN. Patient is asking to have ice water and to go for a walk. Denies pain or discomfort to this RN, just ready to get up and get home.  ? ? ?Assessment and plan: Small neck pillow provided for comfort with new trach. This RN will continue to follow for changes or advances, patient making progress toward SNF goal.  ? ? ? ?Thank you for allowing the Palliative Medicine Team to assist in the care of this patient. ?  ?  ?Shelda Jakes, MSN, RN ?Palliative Medicine Team ?Team Phone: [phone]  ?This phone is monitored 7a-7p, please reach out to attending physician outside of these hours for urgent needs.   ?

## 2022-03-14 ENCOUNTER — Other Ambulatory Visit: Payer: Self-pay | Admitting: Family Medicine

## 2022-03-14 ENCOUNTER — Inpatient Hospital Stay (HOSPITAL_COMMUNITY): Payer: 59

## 2022-03-14 DIAGNOSIS — M7989 Other specified soft tissue disorders: Secondary | ICD-10-CM | POA: Diagnosis not present

## 2022-03-14 DIAGNOSIS — I619 Nontraumatic intracerebral hemorrhage, unspecified: Secondary | ICD-10-CM | POA: Diagnosis not present

## 2022-03-14 LAB — GLUCOSE, CAPILLARY
Glucose-Capillary: 128 mg/dL — ABNORMAL HIGH (ref 70–99)
Glucose-Capillary: 135 mg/dL — ABNORMAL HIGH (ref 70–99)
Glucose-Capillary: 136 mg/dL — ABNORMAL HIGH (ref 70–99)
Glucose-Capillary: 151 mg/dL — ABNORMAL HIGH (ref 70–99)
Glucose-Capillary: 159 mg/dL — ABNORMAL HIGH (ref 70–99)
Glucose-Capillary: 161 mg/dL — ABNORMAL HIGH (ref 70–99)

## 2022-03-14 NOTE — Progress Notes (Addendum)
Speech Language Pathology Treatment: Dysphagia;Cognitive-Linquistic  ?Patient Details ?Name: LAVAUGHN BISIG ?MRN: [id number] ?DOB: 1971-07-15 ?Today's Date: 03/14/2022 ?Time: 3888-7579 ?SLP Time Calculation (min) (ACUTE ONLY): 34 min ? ?Assessment / Plan / Recommendation ?Clinical Impression ? Pt was awake and conversing with daughter on the phone when therapist arrived! She greeted me and asking questions about where she is, what happened and why she has a trach. Repositioned upright and donned PMV. She accepted thin Coke via straw with adequate respiratory/swallow coordination from observation. Did not want to eat applesauce but accepted small bite without difficulty. Encouragement needed for more than one bite graham cracker and stated she "didn't want to get sick." She has a left sided droop and with just starting po's since 2/19 recommend initiate Dys 2 texture, thin liquid, crush meds, full assist, place speaking valve with all meals/snacks. Called daughter [name] to update and answer questions.  ?Her vision continues to appear impaired although uncertain what she can see and neglects her left side. Giving her max cues as to temporal orientation. Cognition to improve now that he is awake and alert. Continue ST.  ?PMV donned during session with wheezing noted at end. Wheeze continued when valve removed but no signs of air trapping. Her speech is dysarthric with decreased intelligibility in sentences.  ?  ?HPI HPI: 51 y/o female came to the [facility] ER on 2/19 with left sided weakness and slurred speech.  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 Intubated 2/19. Trach changed from #6 cuffed to #4 cuffless on 3/16. Palliative NP met with patient's three daughters and decision was reached that they would like to see how PO trials go and if not tolerated they are open to PEG; SNF at discharge is anticipated by all parties. ?  ?   ?SLP Plan ? Continue with current plan of care ? ?  ?   ?Recommendations for follow up therapy are one component of a multi-disciplinary discharge planning process, led by the attending physician.  Recommendations may be updated based on patient status, additional functional criteria and insurance authorization. ?  ? ?Recommendations  ?Diet recommendations: Dysphagia 2 (fine chop);Thin liquid ?Liquids provided via: Straw;Cup ?Medication Administration: Crushed with puree ?Supervision: Staff to assist with self feeding;Full supervision/cueing for compensatory strategies ?Compensations: Slow rate;Small sips/bites;Lingual sweep for clearance of pocketing ?Postural Changes and/or Swallow Maneuvers: Seated upright 90 degrees  ?   ? Patient may use Passy-Muir Speech Valve: During all waking hours (remove during sleep) ?PMSV Supervision: Full  ?   ? ? ? ? Oral Care Recommendations: Oral care QID ?Follow Up Recommendations:  (possibly CIR if progresses well) ?Assistance recommended at discharge: Frequent or constant Supervision/Assistance ?SLP Visit Diagnosis: Aphonia (R49.1);Cognitive communication deficit (R41.841);Dysphagia, unspecified (R13.10) ?Plan: Continue with current plan of care ? ? ? ? ?  ?  ? ? ?[name] ? ?03/14/2022, 11:07 AM ?

## 2022-03-14 NOTE — Progress Notes (Signed)
Occupational Therapy Treatment ?Patient Details ?Name: Sarah Myers ?MRN: [id number] ?DOB: 1971-07-08 ?Today's Date: 03/14/2022 ? ? ?History of present illness 51 y.o. female presents to [facility] hospital on 02/05/2022 with L hemiplegia and dysarthria. 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 Pt intubated 2/19. Attempted extubation 2/23 and 2/26, requiring reintubation same date. Tracheostomy 02/14/2022. PMH includes uncontrolled HTN ?  ?OT comments ? Patient received in bed and agreeable to OT/PT treatment. PROM performed to LUE elbow and hand with patient indicating pain. Patient required max assist +2 to get to EOB and demonstrated left and posterior leaning with patient using RUE to push. Sitting balance addressed with education on head alignment and trunk extension. Patient was 2 person hand held assist to stand from EOB and transferred to recliner. Patient with complaints of abdominal pain and nursing notified. Acute OT to continue to follow.   ? ?Recommendations for follow up therapy are one component of a multi-disciplinary discharge planning process, led by the attending physician.  Recommendations may be updated based on patient status, additional functional criteria and insurance authorization. ?   ?Follow Up Recommendations ? Skilled nursing-short term rehab (<3 hours/day)  ?  ?Assistance Recommended at Discharge Frequent or constant Supervision/Assistance  ?Patient can return home with the following ? Two people to help with walking and/or transfers;Two people to help with bathing/dressing/bathroom;Assistance with feeding;Help with stairs or ramp for entrance;Assist for transportation;Assistance with cooking/housework;Direct supervision/assist for medications management ?  ?Equipment Recommendations ? Hospital bed;Wheelchair cushion (measurements OT);Wheelchair (measurements OT)  ?  ?Recommendations for Other Services   ? ?  ?Precautions / Restrictions  Precautions ?Precautions: Fall ?Precaution Comments: L neglect, PEG ?Restrictions ?Weight Bearing Restrictions: No  ? ? ?  ? ?Mobility Bed Mobility ?Overal bed mobility: Needs Assistance ?Bed Mobility: Supine to Sit ?  ?  ?Supine to sit: Max assist, +2 for physical assistance ?  ?  ?General bed mobility comments: Patient with difficulty getting LEs off side of bed and required assistance with trunk ?  ? ?Transfers ?Overall transfer level: Needs assistance ?Equipment used: 2 person hand held assist ?Transfers: Sit to/from Stand, Bed to chair/wheelchair/BSC ?Sit to Stand: Max assist, Total assist, +2 physical assistance, +2 safety/equipment ?  ?Squat pivot transfers: Max assist, +2 physical assistance ?  ?  ?  ?General transfer comment: sit to stand performed from EOB with poor trunk strength.  Performed transfer after first stand with patient demonstrating trunk flexion. ?  ?  ?Balance Overall balance assessment: Needs assistance ?Sitting-balance support: Feet supported, Single extremity supported ?Sitting balance-Leahy Scale: Poor ?Sitting balance - Comments: used RUE to push towards left and demonstrated left and posterior leaning.  Able to maintain balance for short periods of time without assistance ?Postural control: Left lateral lean, Posterior lean ?Standing balance support: Bilateral upper extremity supported ?Standing balance-Leahy Scale: Poor ?Standing balance comment: Stood from EOB with poor trunk control ?  ?  ?  ?  ?  ?  ?  ?  ?  ?  ?  ?   ? ?ADL either performed or assessed with clinical judgement  ? ?ADL   ?  ?  ?  ?  ?  ?  ?  ?  ?  ?  ?  ?  ?  ?  ?  ?  ?  ?  ?  ?  ?  ? ?Extremity/Trunk Assessment Upper Extremity Assessment ?LUE Deficits / Details: flaccid throughout LUE, with incresed swelling. ?LUE: Shoulder pain with  ROM ?LUE Sensation: decreased light touch ?LUE Coordination: decreased fine motor;decreased gross motor ?  ?  ?  ?  ?  ? ?Vision   ?  ?  ?Perception   ?  ?Praxis   ?  ? ?Cognition  Arousal/Alertness: Awake/alert ?Behavior During Therapy: Flat affect ?Overall Cognitive Status: Difficult to assess ?Area of Impairment: Attention, Following commands, Safety/judgement ?  ?  ?  ?  ?  ?  ?  ?  ?  ?Current Attention Level: Focused ?  ?Following Commands: Follows one step commands inconsistently, Follows one step commands with increased time ?Safety/Judgement: Decreased awareness of safety, Decreased awareness of deficits ?  ?Problem Solving: Slow processing ?General Comments: able to follow commands with increased time ?  ?  ?   ?Exercises Exercises: General Upper Extremity ?General Exercises - Upper Extremity ?Elbow Flexion: PROM, Left, 10 reps ?Elbow Extension: AROM, Left, 10 reps ?Wrist Flexion: PROM, Left, 10 reps ?Wrist Extension: PROM, Left ?Digit Composite Flexion: PROM, Left ?Composite Extension: PROM, Left ? ?  ?Shoulder Instructions   ? ? ?  ?General Comments    ? ? ?Pertinent Vitals/ Pain       Pain Assessment ?Pain Assessment: Faces ?Faces Pain Scale: Hurts little more ?Pain Location: abdomen ?Pain Descriptors / Indicators: Grimacing ?Pain Intervention(s): Monitored during session, Repositioned, Other (comment) (nursing notified) ? ?Home Living   ?  ?  ?  ?  ?  ?  ?  ?  ?  ?  ?  ?  ?  ?  ?  ?  ?  ?  ? ?  ?Prior Functioning/Environment    ?  ?  ?  ?   ? ?Frequency ? Min 2X/week  ? ? ? ? ?  ?Progress Toward Goals ? ?OT Goals(current goals can now be found in the care plan section) ? Progress towards OT goals: Progressing toward goals ? ?Acute Rehab OT Goals ?OT Goal Formulation: Patient unable to participate in goal setting ?Time For Goal Achievement: 03/15/22 ?Potential to Achieve Goals: Fair ?ADL Goals ?Pt Will Perform Grooming: with min assist;sitting ?Additional ADL Goal #1: Patient will perfrom supine to sit with Mod A to increase independence with toileting. ?Additional ADL Goal #2: Patient will follow two step commands 50% of the time with Min VC's  ?Plan Discharge plan remains  appropriate   ? ?Co-evaluation ? ? ? PT/OT/SLP Co-Evaluation/Treatment: Yes ?Reason for Co-Treatment: For patient/therapist safety;To address functional/ADL transfers ?  ?OT goals addressed during session: Strengthening/ROM ?  ? ?  ?AM-PAC OT "6 Clicks" Daily Activity     ?Outcome Measure ? ? Help from another person eating meals?: Total ?Help from another person taking care of personal grooming?: A Lot ?Help from another person toileting, which includes using toliet, bedpan, or urinal?: Total ?Help from another person bathing (including washing, rinsing, drying)?: A Lot ?Help from another person to put on and taking off regular upper body clothing?: A Lot ?Help from another person to put on and taking off regular lower body clothing?: Total ?6 Click Score: 9 ? ?  ?End of Session Equipment Utilized During Treatment: Gait belt;Oxygen ? ?OT Visit Diagnosis: Muscle weakness (generalized) (M62.81);Other symptoms and signs involving cognitive function;Hemiplegia and hemiparesis ?Hemiplegia - Right/Left: Left ?Hemiplegia - dominant/non-dominant: Dominant ?Hemiplegia - caused by: Nontraumatic intracerebral hemorrhage ?  ?Activity Tolerance Patient tolerated treatment well ?  ?Patient Left in chair;with call bell/phone within reach;with chair alarm set ?  ?Nurse Communication Mobility status ?  ? ?   ? ?  Time: 5009-3818 ?OT Time Calculation (min): 33 min ? ?Charges: OT General Charges ?$OT Visit: 1 Visit ?OT Treatments ?$Therapeutic Activity: 8-22 mins ? ?Alfonse Flavors, OTA ?Acute Rehabilitation Services  ?Pager [phone] ?Office [phone] ? ? ?Marticia Reifschneider Jeannett Senior ?03/14/2022, 1:27 PM ?

## 2022-03-14 NOTE — Progress Notes (Signed)
?Progress Note ? ? ?Patient: Sarah Myers [id number] DOB: 1971-05-17 DOA: 02/05/2022     37 ?DOS: the patient was seen and examined on 03/14/2022 ?  ?Brief hospital course: ?51 year old with history of hypertension presented to the emergency room with left-sided weakness and slurred speech.  She was found to have dense left hemiplegia and intracranial hemorrhage. ?2/19, admitted to intensive care unit with left-sided dense hemiplegia and slurred speech and found to have right intracranial hemorrhage with brain compression present on admission.  Treated with 3% saline, Cleviprex and intubated.  She failed intubation x2 and underwent tracheostomy on 2/28 by PCCM. ?2/28, tracheostomy. ?3/1 trach collar, off ventilator on 3/4. ?Transferred to medical floor on 3/6.  Cuffless trach on 3/16.  PEG tube placed on 3/20. ?Hospital course complicated by MSSA pneumonia, completed antibiotic therapy.   ?  ? 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 ?Continue Klonopin 0.5 mg at HS Zyprexa 2.5 mg HS; continue Robaxin for hypertonicity and Symmetrel for brain injury sequela ?Remains very alert and now is verbally communicative although has short-term memory deficits ?Requested to go to the bathroom and was able to mobilize with staff to the bedside commode noting has expected significant deficits related to left hemiplegia ?SLP evaluated patient today and recommended to diet with thin liquids under supervision.  Recommends PMSV as tolerated ? 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 ?Tracheostomy dependent ([place]) ?2/28, tracheostomy ?3/1, trach collar. ?3/16, downsized to cuffless #4 trach.   Currently on trach collar with minimum oxygen. ?Patient pulled out trach over the weekend but did not tolerate noting sats down to 78% ?3/27 initiating PMSV trials-currently limited by degree of secretions ? ?Dysphagia due to recent stroke/moderate PCM ?PEG tube placed by IR on 3/20 ?Continue tube feeding ?Change IV Heparin back to Eliquis ?Change Senokot to scheduled BID ?SLP as of 3/27 has recommended D2 diet with thin liquids noting patient much more alert and more apt to tolerate this diet.  Currently limited by early satiety and recent nausea.  Once starts eating more than a few bites and is eating up to 40 to 50% of meals can consider transitioning to nocturnal tube feeding ? ? ? ?Bilateral pulmonary embolism (HCC) ?Detected on CT angiogram 3/7.  ?Cleared for anticoagulation by neurology/Dr.Xu on 3/8  ?Patient was initially on heparin, then transitioned to Eliquis.   ?Currently has issues with abdominal distention of unclear etiology therefore all medications and tube feeding have been stopped per tube.  Discussed with pharmacy who recommended IV heparin as opposed to Lovenox. ? ? ?Malignant hypertension ?Presented with hypertensive emergency ?BP well controlled on Norvasc, carvedilol, Cozaar and Catapres ?Has as needed IV hydralazine as well as labetalol available ? ?Oral thrush ?Continue Diflucan-initiated 3/26-change to nystatin when allowed POs ? ?Iron deficiency anemia ?Current hemoglobin stable at 11.5 ?Consider regular per tube iron replacement ? 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 Subcutaneous fat may be visible but bone, tendon or muscle are NOT exposed. (Active)  ?Date First Assessed/Time First Assessed: 02/26/22 0443   Location: Cervical  Location Orientation: Left  Staging: Stage 3 -  Full thickness tissue loss. Subcutaneous fat may  be visible but bone, tendon or muscle are NOT exposed.  Present on Admission: No  ?  ?Assessments  02/26/2022  4:44 AM 03/09/2022  8:00 AM  ?Dressing Type -- Foam - Lift dressing to assess site every shift  ?Dressing Clean, Dry, Intact Reinforced  ?Dressing Change Frequency -- Every 3 days  ?Peri-wound Assessment Erythema (blanchable) --  ?Margins Unattached edges (unapproximated) Unattached edges (unapproximated)  ?Drainage Amount None --  ?   ?No Linked orders to display  ? ? ?Counseling regarding goals of care ?High risk of rehemorrhage on anticoagulation.   ?Palliative care, family meeting 3/16.  She is very high risk of decompensation, she is very high risk of getting another bleeding.  Family requested full scope of treatment ?3/21 patient's daughter [name] at the bedside.  Given updates about brain hemorrhage, massive pulmonary embolism on blood thinners and challenging treatment options previous attending physician.  All questions were answered.  Since patient becomes very uncomfortable at night, we decided to keep on current doses of medications including Seroquel and pain medications. ? ? ? ? ?  ? ?Subjective:  ? ? ?Physical Exam: ?Vitals:  ? 03/14/22 0813 03/14/22 0836 03/14/22 1054 03/14/22 1156  ?BP: (!) 169/89  139/86   ?Pulse: 73 73  70  ?Resp: 20 18 20 16   ?Temp: 98.3 ?F (36.8 ?C)  98.4 ?F (36.9 ?C)   ?TempSrc:   Axillary   ?SpO2: 100% 100%  100%  ?Weight:      ?Height:      ? ?Constitutional: Awake and alert and mildly restless noting she continues to asked to go to the bathroom ?Respiratory: Lung sounds are clear to auscultation, has trach in place 4.0 cuffless to trach collar, stable on 5 L O2 ?Cardiovascular: Regular rate and rhythm, No extremity edema.  Normotensive ?Abdomen: + tenderness when palpating directly over PEG site, Bowel sounds positive.  LBM 3/25 ?Musculoskeletal: no clubbing / cyanosis.  Generalized hypertonic muscle tone has significantly improved ?Neurologic: CN 2-12 appears to be grossly intact. Sensation appears to be grossly intact especially on the right.  Quickly follow  simple commands with right upper extremity.  Unable to move right lower extremity as well.  Noted with apparent hyperesthesias on left upper extremity no movement left lower extremity ?Psychiatric: Awake and very alert.  Oriented to name only.  Able to speak but at times speech is difficult to understand.  She does state that she is thirsty and is requesting iced tea. ? ?Data Reviewed: ?CBC reviewed for today ? ?Family Communication:  ?Updated dtr Berdine Dance ? ?Disposition: ?Remains inpatient appropriate because:  ?Requires skilled nursing level of care due to tracheostomy tube, unresponsive state and ongoing need for tube feedings.  She is custodial care. ? ?Planned Discharge Destination:  ?Skilled nursing facility ? ?Medically stable ?Yes ? ?COVID vaccination status:  ?unknown ? ?Consultants: ?Neurosurgery ?PCCM ?Palliative medicine ?Internal radiology ?Procedures: ?Intubation ?Echocardiogram ?Follow-up echocardiogram with bubble study ?Cortrack tube ?Bedside tracheostomy tube placement ?PEG tube placement ?Antibiotics: ?IV vancomycin x1 dose on 2/23 ?Cefepime 2/23 through 3/1 ?Azithromycin 3/7 ?Cefepime 3/7, 3/8 ?Vancomycin 3/7 ?Cefazolin 3/8 through 3/16 ?Fluconazole 3/19 ?Cefazolin 3/20 ? ? ? ? ?Time spent: 45 minutes ? ?Author: ?Erin Hearing, NP ?03/14/2022 11:57 AM ? ?For on call review [email or web address].  ?

## 2022-03-14 NOTE — Progress Notes (Signed)
Physical Therapy Treatment ?Patient Details ?Name: Sarah Myers ?MRN: [id number] ?DOB: 07-23-1971 ?Today's Date: 03/14/2022 ? ? ?History of Present Illness 51 y.o. female presents to [facility] hospital on 02/05/2022 with L hemiplegia and dysarthria. 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 Pt intubated 2/19. Attempted extubation 2/23 and 2/26, requiring reintubation same date. Tracheostomy 02/14/2022. PMH includes uncontrolled HTN ? ?  ?PT Comments  ? ? Pt with improved level of alertness and active participation. Pt c/o abdominal pain today, RN made aware. PT/OT focused on EOB balance while doing L UE there ex and progressed to OOB transfer to chair. Acute PT to cont to follow. Continue to recommend SNF Upon d/c. ?   ?Recommendations for follow up therapy are one component of a multi-disciplinary discharge planning process, led by the attending physician.  Recommendations may be updated based on patient status, additional functional criteria and insurance authorization. ? ?Follow Up Recommendations ? Skilled nursing-short term rehab (<3 hours/day) ?  ?  ?Assistance Recommended at Discharge Frequent or constant Supervision/Assistance  ?Patient can return home with the following Two people to help with walking and/or transfers;Two people to help with bathing/dressing/bathroom;Assistance with cooking/housework;Assistance with feeding;Direct supervision/assist for financial management;Direct supervision/assist for medications management;Help with stairs or ramp for entrance;Assist for transportation ?  ?Equipment Recommendations ? Wheelchair (measurements PT);Wheelchair cushion (measurements PT);Hospital bed;Other (comment) (hoyer lift)  ?  ?Recommendations for Other Services Rehab consult ? ? ?  ?Precautions / Restrictions Precautions ?Precautions: Fall ?Precaution Comments: L neglect, PEG, trach ?Restrictions ?Weight Bearing Restrictions: No ?Other Position/Activity Restrictions: pt self  limiting with L UE due to pain and hemiparesis  ?  ? ?Mobility ? Bed Mobility ?Overal bed mobility: Needs Assistance ?Bed Mobility: Supine to Sit ?  ?  ?Supine to sit: Max assist, +2 for physical assistance ?  ?  ?General bed mobility comments: maxA for L LE management and trunk elevation, pt unable to move L LE or use L UE functionally ?  ? ?Transfers ?Overall transfer level: Needs assistance ?Equipment used: 2 person hand held assist ?Transfers: Sit to/from Stand, Bed to chair/wheelchair/BSC ?Sit to Stand: Max assist, +2 physical assistance ?  ?  ?Squat pivot transfers: Max assist, +2 physical assistance ?  ?  ?General transfer comment: pt unable to achieve full upright trunk due to L UE/LE flaccidity and truncal waeakness/pain. completed step to transfer with minimal L LE WBing and L knee blocked by OT, maxA for weight shifting ?  ? ?Ambulation/Gait ?  ?  ?  ?  ?  ?  ?  ?General Gait Details: unable ? ? ?Stairs ?  ?  ?  ?  ?  ? ? ?Wheelchair Mobility ?  ? ?Modified Rankin (Stroke Patients Only) ?Modified Rankin (Stroke Patients Only) ?Pre-Morbid Rankin Score: No symptoms ?Modified Rankin: Severe disability ? ? ?  ?Balance Overall balance assessment: Needs assistance ?Sitting-balance support: Feet supported, Single extremity supported ?Sitting balance-Leahy Scale: Poor ?Sitting balance - Comments: used RUE to push towards left and demonstrated left and posterior leaning.  Able to maintain balance for short periods of time without assistance ?Postural control: Left lateral lean, Posterior lean ?Standing balance support: Bilateral upper extremity supported ?Standing balance-Leahy Scale: Poor ?Standing balance comment: Stood from EOB with poor trunk control ?  ?  ?  ?  ?  ?  ?  ?  ?  ?  ?  ?  ? ?  ?Cognition Arousal/Alertness: Awake/alert ?Behavior During Therapy: Flat affect ?Overall Cognitive Status:  Difficult to assess ?Area of Impairment: Attention, Following commands, Safety/judgement ?  ?  ?  ?  ?  ?  ?  ?  ?   ?Current Attention Level: Focused ?  ?Following Commands: Follows one step commands inconsistently, Follows one step commands with increased time ?Safety/Judgement: Decreased awareness of safety, Decreased awareness of deficits (L neglect) ?  ?Problem Solving: Slow processing ?General Comments: able to follow commands with increased time ?  ?  ? ?  ?Exercises Other Exercises ?Other Exercises: worked on trying to self correct trunk to midline while balance EOB, pt provide cell phone in selfie mode for biofeedback to self correct ? ?  ?General Comments General comments (skin integrity, edema, etc.): VSS ?  ?  ? ?Pertinent Vitals/Pain Pain Assessment ?Pain Assessment: Faces ?Faces Pain Scale: Hurts little more ?Pain Location: abdomen and L UE ?Pain Descriptors / Indicators: Grimacing ?Pain Intervention(s): Monitored during session (RN informed)  ? ? ?Home Living   ?  ?  ?  ?  ?  ?  ?  ?  ?  ?   ?  ?Prior Function    ?  ?  ?   ? ?PT Goals (current goals can now be found in the care plan section) Acute Rehab PT Goals ?PT Goal Formulation: Patient unable to participate in goal setting ?Time For Goal Achievement: 03/21/22 ?Potential to Achieve Goals: Fair ?Progress towards PT goals: Progressing toward goals ? ?  ?Frequency ? ? ? Min 3X/week ? ? ? ?  ?PT Plan Frequency needs to be updated  ? ? ?Co-evaluation PT/OT/SLP Co-Evaluation/Treatment: Yes ?Reason for Co-Treatment: For patient/therapist safety ?PT goals addressed during session: Mobility/safety with mobility ?OT goals addressed during session: Strengthening/ROM ?  ? ?  ?AM-PAC PT "6 Clicks" Mobility   ?Outcome Measure ? Help needed turning from your back to your side while in a flat bed without using bedrails?: A Lot ?Help needed moving from lying on your back to sitting on the side of a flat bed without using bedrails?: A Lot ?Help needed moving to and from a bed to a chair (including a wheelchair)?: A Lot ?Help needed standing up from a chair using your arms  (e.g., wheelchair or bedside chair)?: Total ?Help needed to walk in hospital room?: Total ?Help needed climbing 3-5 steps with a railing? : Total ?6 Click Score: 9 ? ?  ?End of Session Equipment Utilized During Treatment: Oxygen ?Activity Tolerance: Patient tolerated treatment well ?Patient left: with call bell/phone within reach;in chair;with chair alarm set ?Nurse Communication: Mobility status ?PT Visit Diagnosis: Other abnormalities of gait and mobility (R26.89);Hemiplegia and hemiparesis ?Hemiplegia - Right/Left: Left ?Hemiplegia - dominant/non-dominant: Dominant ?Hemiplegia - caused by: Nontraumatic intracerebral hemorrhage ?  ? ? ?Time: [id number] ?PT Time Calculation (min) (ACUTE ONLY): 35 min ? ?Charges:  $Neuromuscular Re-education: 8-22 mins          ?          ? ?Kittie Plater, PT, DPT ?Acute Rehabilitation Services ?Pager #: [phone] ?Office #: [phone] ? ? ? ?Naveah Brave M Helmut Hennon ?03/14/2022, 1:43 PM ? ?

## 2022-03-14 NOTE — Progress Notes (Signed)
Upper extremity venous LT study completed.   Please see CV Proc for preliminary results.   Taylar Hartsough, RDMS, RVT  

## 2022-03-14 NOTE — TOC Progression Note (Signed)
Transition of Care (TOC) - Progression Note  ? ? ?Patient Details  ?Name: Sarah Myers ?MRN: [id number] ?Date of Birth: October 21, 1971 ? ?Transition of Care (TOC) CM/SW Contact  ?Janae Bridgeman, RN ?Phone Number: ?03/14/2022, 9:20 AM ? ?Clinical Narrative:    ?CM called and spoke with Mariella Saa, Admissions coordinator for [facility] and she will review the patient's clinicals today to possible bed offer to local facility for admission. ? ?CM and MSW with DTP Team will continue to follow the patient for needed SNF placement. ? ? ?Expected Discharge Plan: Skilled Nursing Facility ?Barriers to Discharge: Continued Medical Work up ? ?Expected Discharge Plan and Services ?Expected Discharge Plan: Skilled Nursing Facility ?In-house Referral: Clinical Social Work, Artist ?Discharge Planning Services: CM Consult ?Post Acute Care Choice: Skilled Nursing Facility ?Living arrangements for the past 2 months: Apartment ?                ?  ?  ?  ?  ?  ?  ?  ?  ?  ?  ? ? ?Social Determinants of Health (SDOH) Interventions ?  ? ?Readmission Risk Interventions ? ?  03/09/2022  ?  9:54 AM  ?Readmission Risk Prevention Plan  ?Transportation Screening Complete  ?PCP or Specialist Appt within 3-5 Days Complete  ?HRI or Home Care Consult Not Complete  ?Social Work Consult for Recovery Care Planning/Counseling Complete  ?Medication Review Oceanographer) Complete  ? ? ?

## 2022-03-14 NOTE — Plan of Care (Signed)
Pt has tried the [facility] and sat in the chair for an hour. MD are aware. Oriented x2 to herself and month. Cleaned mouth frequently until she was tired of the taste. ST evaluated today. Speaking valve utilized and per order for eating. Small BM today. Daughter [name] at bedside now. Updated daughter and let her know she was emotional this evening because she wanted to be home with her kids.  ?Problem: Education: ?Goal: Knowledge of General Education information will improve ?Description: Including pain rating scale, medication(s)/side effects and non-pharmacologic comfort measures ?Outcome: Progressing ?  ?Problem: Health Behavior/Discharge Planning: ?Goal: Ability to manage health-related needs will improve ?Outcome: Progressing ?  ?Problem: Clinical Measurements: ?Goal: Ability to maintain clinical measurements within normal limits will improve ?Outcome: Progressing ?Goal: Will remain free from infection ?Outcome: Progressing ?Goal: Diagnostic test results will improve ?Outcome: Progressing ?Goal: Respiratory complications will improve ?Outcome: Progressing ?Goal: Cardiovascular complication will be avoided ?Outcome: Progressing ?  ?Problem: Activity: ?Goal: Risk for activity intolerance will decrease ?Outcome: Progressing ?  ?Problem: Nutrition: ?Goal: Adequate nutrition will be maintained ?Outcome: Progressing ?  ?Problem: Coping: ?Goal: Level of anxiety will decrease ?Outcome: Progressing ?  ?Problem: Elimination: ?Goal: Will not experience complications related to bowel motility ?Outcome: Progressing ?Goal: Will not experience complications related to urinary retention ?Outcome: Progressing ?  ?Problem: Pain Managment: ?Goal: General experience of comfort will improve ?Outcome: Progressing ?  ?Problem: Safety: ?Goal: Ability to remain free from injury will improve ?Outcome: Progressing ?  ?Problem: Skin Integrity: ?Goal: Risk for impaired skin integrity will decrease ?Outcome: Progressing ?  ?Problem:  Education: ?Goal: Knowledge of disease or condition will improve ?Outcome: Progressing ?Goal: Knowledge of patient specific risk factors will improve (INDIVIDUALIZE FOR PATIENT) ?Outcome: Progressing ?  ?

## 2022-03-15 DIAGNOSIS — R4182 Altered mental status, unspecified: Secondary | ICD-10-CM

## 2022-03-15 DIAGNOSIS — I619 Nontraumatic intracerebral hemorrhage, unspecified: Secondary | ICD-10-CM | POA: Diagnosis not present

## 2022-03-15 DIAGNOSIS — R627 Adult failure to thrive: Secondary | ICD-10-CM

## 2022-03-15 LAB — GLUCOSE, CAPILLARY
Glucose-Capillary: 124 mg/dL — ABNORMAL HIGH (ref 70–99)
Glucose-Capillary: 124 mg/dL — ABNORMAL HIGH (ref 70–99)
Glucose-Capillary: 146 mg/dL — ABNORMAL HIGH (ref 70–99)
Glucose-Capillary: 149 mg/dL — ABNORMAL HIGH (ref 70–99)
Glucose-Capillary: 150 mg/dL — ABNORMAL HIGH (ref 70–99)
Glucose-Capillary: 169 mg/dL — ABNORMAL HIGH (ref 70–99)

## 2022-03-15 NOTE — Progress Notes (Signed)
Occupational Therapy Treatment ?Patient Details ?Name: Sarah Myers ?MRN: [id number] ?DOB: 07-16-1971 ?Today's Date: 03/15/2022 ? ? ?History of present illness 51 y.o. female presents to [facility] hospital on 02/05/2022 with L hemiplegia and dysarthria. 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 Pt intubated 2/19. Attempted extubation 2/23 and 2/26, requiring reintubation same date. Tracheostomy 02/14/2022. PMH includes uncontrolled HTN ?  ?OT comments ? Pt is making incremental progress with OT goals. At this time, pt is limited due to flaccidity in LLE and LUE, cognitive deficits, L sided inattention, balance deficits, and weakness. This session, pt focused on wanting to know how she was going to stay healthy with the trach. She sat EOB and worked on washing her face, however required HHA and min-max A support for sitting balance. Continuing to recommend SNF, acute OT will continue to follow.   ? ?Recommendations for follow up therapy are one component of a multi-disciplinary discharge planning process, led by the attending physician.  Recommendations may be updated based on patient status, additional functional criteria and insurance authorization. ?   ?Follow Up Recommendations ? Skilled nursing-short term rehab (<3 hours/day)  ?  ?Assistance Recommended at Discharge Frequent or constant Supervision/Assistance  ?Patient can return home with the following ? Two people to help with walking and/or transfers;Two people to help with bathing/dressing/bathroom;Assistance with feeding;Help with stairs or ramp for entrance;Assist for transportation;Assistance with cooking/housework;Direct supervision/assist for medications management ?  ?Equipment Recommendations ? Hospital bed;Wheelchair cushion (measurements OT);Wheelchair (measurements OT)  ?  ?Recommendations for Other Services   ? ?  ?Precautions / Restrictions Precautions ?Precautions: Fall ?Precaution Comments: L neglect, PEG,  trach ?Restrictions ?Weight Bearing Restrictions: No  ? ? ?  ? ?Mobility Bed Mobility ?Overal bed mobility: Needs Assistance ?Bed Mobility: Supine to Sit, Sit to Supine ?  ?  ?Supine to sit: Max assist ?Sit to supine: Max assist ?  ?General bed mobility comments: maxA for L LE management and trunk elevation, pt unable to move L LE or use L UE functionally ?  ? ?Transfers ?  ?  ?  ?  ?  ?  ?  ?  ?  ?General transfer comment: deferred due to poor sitting balance and no +2 assist ?  ?  ?Balance Overall balance assessment: Needs assistance ?Sitting-balance support: Feet supported, Single extremity supported ?Sitting balance-Leahy Scale: Poor ?Sitting balance - Comments: used RUE to push towards left and demonstrated left and posterior leaning.  Able to maintain balance for short periods of time without assistance ?Postural control: Left lateral lean, Posterior lean ?  ?  ?  ?  ?  ?  ?  ?  ?  ?  ?  ?  ?  ?  ?   ? ?ADL either performed or assessed with clinical judgement  ? ?ADL Overall ADL's : Needs assistance/impaired ?Eating/Feeding: Maximal assistance;Bed level ?Eating/Feeding Details (indicate cue type and reason): Peg tube ?Grooming: Wash/dry face;Maximal assistance;Sitting ?Grooming Details (indicate cue type and reason): HHA to support as pt washes her face EOB ?  ?  ?  ?  ?  ?  ?  ?  ?  ?  ?  ?  ?  ?  ?  ?General ADL Comments: Limited to bed level and EOB mobility this session ?  ? ?Extremity/Trunk Assessment   ?  ?  ?  ?  ?  ? ?Vision   ?  ?  ?Perception   ?  ?Praxis   ?  ? ?  Cognition Arousal/Alertness: Awake/alert ?Behavior During Therapy: Restless ?Overall Cognitive Status: Impaired/Different from baseline ?Area of Impairment: Attention, Following commands, Safety/judgement, Problem solving ?  ?  ?  ?  ?  ?  ?  ?  ?  ?Current Attention Level: Focused ?  ?Following Commands: Follows one step commands with increased time ?Safety/Judgement: Decreased awareness of safety, Decreased awareness of deficits ?   ?Problem Solving: Slow processing ?General Comments: Pt requiring increased time, multimodal cuing, and demonstrates poor awarness into her deficits, asking about whether she should go outside or not, due to her trach. ?  ?  ?   ?Exercises Exercises: Other exercises ?Other Exercises ?Other Exercises: worked on trying to self correct trunk to midline while balance EOB ?Other Exercises: PROM of LUE: shoulder flex, elbow flex/ext, wrist flex/ext, finger flex/ext ? ?  ?Shoulder Instructions   ? ? ?  ?General Comments VSS  ? ? ?Pertinent Vitals/ Pain       Pain Assessment ?Pain Assessment: No/denies pain ? ?Home Living   ?  ?  ?  ?  ?  ?  ?  ?  ?  ?  ?  ?  ?  ?  ?  ?  ?  ?  ? ?  ?Prior Functioning/Environment    ?  ?  ?  ?   ? ?Frequency ? Min 2X/week  ? ? ? ? ?  ?Progress Toward Goals ? ?OT Goals(current goals can now be found in the care plan section) ? Progress towards OT goals: Progressing toward goals ? ?Acute Rehab OT Goals ?OT Goal Formulation: Patient unable to participate in goal setting ?Time For Goal Achievement: 03/29/22 ?Potential to Achieve Goals: Fair ?ADL Goals ?Pt Will Perform Grooming: with min assist;sitting ?Additional ADL Goal #1: Patient will perfrom supine to sit with Mod A to increase independence with toileting. ?Additional ADL Goal #2: Patient will follow two step commands 50% of the time with Min VC's  ?Plan Discharge plan remains appropriate   ? ?Co-evaluation ? ? ?   ?  ?  ?  ?  ? ?  ?AM-PAC OT "6 Clicks" Daily Activity     ?Outcome Measure ? ? Help from another person eating meals?: A Lot ?Help from another person taking care of personal grooming?: A Lot ?Help from another person toileting, which includes using toliet, bedpan, or urinal?: Total ?Help from another person bathing (including washing, rinsing, drying)?: A Lot ?Help from another person to put on and taking off regular upper body clothing?: A Lot ?Help from another person to put on and taking off regular lower body clothing?:  Total ?6 Click Score: 10 ? ?  ?End of Session Equipment Utilized During Treatment: Oxygen ? ?OT Visit Diagnosis: Muscle weakness (generalized) (M62.81);Other symptoms and signs involving cognitive function;Hemiplegia and hemiparesis ?Hemiplegia - Right/Left: Left ?Hemiplegia - dominant/non-dominant: Dominant ?Hemiplegia - caused by: Nontraumatic intracerebral hemorrhage ?  ?Activity Tolerance Patient tolerated treatment well ?  ?Patient Left in bed;with call bell/phone within reach;with bed alarm set ?  ?Nurse Communication Mobility status ?  ? ?   ? ?Time: 5400-8676 ?OT Time Calculation (min): 28 min ? ?Charges: OT General Charges ?$OT Visit: 1 Visit ?OT Treatments ?$Therapeutic Activity: 23-37 mins ? ?Saundra Gin H., OTR/L ?Acute Rehabilitation ? ?Evalynn Hankins Elane Bing Plume ?03/15/2022, 6:49 PM ?

## 2022-03-15 NOTE — Progress Notes (Signed)
?Progress Note ? ? ?Patient: Sarah Myers [id number] DOB: 1971-10-07 DOA: 02/05/2022     38 ?DOS: the patient was seen and examined on 03/15/2022 ?  ?Brief hospital course: ?51 year old with history of hypertension presented to the emergency room with left-sided weakness and slurred speech.  She was found to have dense left hemiplegia and intracranial hemorrhage. ?2/19, admitted to intensive care unit with left-sided dense hemiplegia and slurred speech and found to have right intracranial hemorrhage with brain compression present on admission.  Treated with 3% saline, Cleviprex and intubated.  She failed intubation x2 and underwent tracheostomy on 2/28 by PCCM. ?2/28, tracheostomy. ?3/1 trach collar, off ventilator on 3/4. ?Transferred to medical floor on 3/6.  Cuffless trach on 3/16.  PEG tube placed on 3/20. ?Hospital course complicated by MSSA pneumonia, completed antibiotic therapy.   ?  ? 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 ?Continue Klonopin 0.5 mg at HS Zyprexa 2.5 mg HS; continue Robaxin for hypertonicity and Symmetrel for brain injury sequela ?Remains very alert and now is verbally communicative although has short-term memory deficits ?Requested to go to the bathroom and was able to mobilize with staff to the bedside commode noting has expected significant deficits related to left hemiplegia ?SLP evaluated patient today and recommended to diet with thin liquids under supervision.  Recommends PMSV as tolerated ? 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 ?Tracheostomy dependent ([place]) ?2/28, tracheostomy ?3/1, trach collar. ?3/16, downsized to cuffless #4 trach.   Currently on trach collar with minimum oxygen. ?Patient pulled out trach over the weekend but did not tolerate noting sats down to 78% ?3/27 initiating PMSV trials-currently limited by degree of secretions ?3/29 patient was able to tolerate PMV valve throughout entire SLP session ? ?Dysphagia due to recent stroke/moderate PCM ?PEG tube placed by IR on 3/20 ?Continue tube feeding ?Change IV Heparin back to Eliquis ?Change Senokot to scheduled BID ?Continue D2 diet with thin liquids noting patient much more alert and more apt to tolerate this diet.  If intake improves may need to consider transitioning to nocturnal tube feedings ? ? ? ?Bilateral pulmonary embolism (HCC) ?Detected on CT angiogram 3/7.  ?Cleared for anticoagulation by neurology/Dr.Xu on 3/8  ?Patient was initially on heparin, then transitioned to Eliquis.   ?Currently has issues with abdominal distention of unclear etiology therefore all medications and tube feeding have been stopped per tube.  Discussed with pharmacy who recommended IV heparin as opposed to Lovenox. ? ? ?Malignant hypertension ?Presented with hypertensive emergency ?BP well controlled on Norvasc, carvedilol, Cozaar and Catapres ?Has as needed IV hydralazine as well as labetalol available ? ?Oral thrush ?Continue Diflucan-initiated 3/26-change to nystatin when allowed POs ? ?Iron deficiency anemia ?Current hemoglobin stable at 11.5 ?Consider regular per tube iron replacement ? 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 Subcutaneous fat may be visible but bone, tendon or muscle are NOT exposed. (Active)  ?Date First Assessed/Time First Assessed: 02/26/22 0443   Location: Cervical  Location Orientation: Left  Staging: Stage 3 -  Full thickness tissue loss. Subcutaneous fat may be visible but bone, tendon or muscle are NOT exposed.  Present on Admission: No  ?  ?  Assessments 02/26/2022  4:44 AM 03/09/2022  8:00 AM  ?Dressing Type --  Foam - Lift dressing to assess site every shift  ?Dressing Clean, Dry, Intact Reinforced  ?Dressing Change Frequency -- Every 3 days  ?Peri-wound Assessment Erythema (blanchable) --  ?Margins Unattached edges (unapproximated) Unattached edges (unapproximated)  ?Drainage Amount None --  ?   ?No Linked orders to display  ? ? ?Counseling regarding goals of care ?High risk of rehemorrhage on anticoagulation.   ?Palliative care, family meeting 3/16.  She is very high risk of decompensation, she is very high risk of getting another bleeding.  Family requested full scope of treatment ?3/21 patient's daughter [name] at the bedside.  Given updates about brain hemorrhage, massive pulmonary embolism on blood thinners and challenging treatment options previous attending physician.  All questions were answered.  Since patient becomes very uncomfortable at night, we decided to keep on current doses of medications including Seroquel and pain medications. ? ? ? ? ?  ? ?Subjective:  ?Sleeping upon my entry into the room.  Later I was notified by SLP that she was alert and working with them today. ? ?Physical Exam: ?Vitals:  ? 03/15/22 0318 03/15/22 0839 03/15/22 0945 03/15/22 1108  ?BP: 134/88 (!) 155/84    ?Pulse: 71 71    ?Resp: 19 17 18 18   ?Temp:  98.5 ?F (36.9 ?C)    ?TempSrc:  Axillary    ?SpO2: 100% 100%    ?Weight:      ?Height:      ? ?Constitutional: Sleeping today and did not awaken during my exam but at baseline awake and alert and mildly restless noting she continues to asked to go to the bathroom ?Respiratory: Lung sounds are clear to auscultation, has trach in place 4.0 cuffless to trach collar, stable on 5 L O2 ?Cardiovascular: Regular rate and rhythm, No extremity edema.  Normotensive ?Abdomen: + tenderness when palpating directly over PEG site, Bowel sounds positive.  LBM 3/28 ?Musculoskeletal: no clubbing / cyanosis.  Generalized hypertonic muscle tone has significantly improved ?Neurologic: CN 2-12 appears to be  grossly intact. Sensation appears to be grossly intact especially on the right.  Quickly follow simple commands with right upper extremity.  Unable to move right lower extremity as well.  Noted with apparent hyperesthesias on left upper extremity no movement left lower extremity.  Appears to have a left hemianopsia ?Psychiatric: Sleeping soundly now but at baseline awake and very alert.  Oriented to name only.  Speaking more clearly ? ?Data Reviewed: ?There are no new results to review at this time. ? ?Family Communication:  ?Updated dtr Berdine Dance ? ?Disposition: ?Remains inpatient appropriate because:  ?Requires skilled nursing level of care due to tracheostomy tube, unresponsive state and ongoing need for tube feedings.  She is custodial care. ? ?Planned Discharge Destination:  ?Skilled nursing facility ? ?Medically stable ?Yes ? ?COVID vaccination status:  ?unknown ? ?Consultants: ?Neurosurgery ?PCCM ?Palliative medicine ?Internal radiology ?Procedures: ?Intubation ?Echocardiogram ?Follow-up echocardiogram with bubble study ?Cortrack tube ?Bedside tracheostomy tube placement ?PEG tube placement ?Antibiotics: ?IV vancomycin x1 dose on 2/23 ?Cefepime 2/23 through 3/1 ?Azithromycin 3/7 ?Cefepime 3/7, 3/8 ?Vancomycin 3/7 ?Cefazolin 3/8 through 3/16 ?Fluconazole 3/19 ?Cefazolin 3/20 ? ? ? ? ?Time spent: 45 minutes ? ?Author: ?Erin Hearing, NP ?03/15/2022 11:53 AM ? ?For on call review [email or web address].  ?

## 2022-03-15 NOTE — Progress Notes (Signed)
Patient ID: Warden Fillers, female   DOB: 1971/06/26, 51 y.o.   MRN: [id number] ? ? ? ?Progress Note from the Palliative Medicine Team at [facility] ? ? ?Patient Name: Sarah Myers        ?Date: 03/15/2022 ?DOB: 1971-07-01  Age: 51 y.o. MRN#: [id number] ?Attending Physician: Bonnielee Haff, MD ?Primary Care Physician: [name], MD ?Admit Date: 02/05/2022 ? ? ?Medical records reviewed  ? ?51 y.o. female   admitted on 02/05/2022 after presenting to emergency room with left-sided weakness and slurred speech.  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  ?  ?IMPRESSION:   Brain MRI 02/07/22 ?Acute to early subacute large parenchymal hemorrhage as seen on ?recent CT imaging with similar surrounding edema and regional mass ?effect. Intraventricular extension is again noted. No hydrocephalus. ? ? ?  ?Family face ongoing treatment option decsions, advanced directive decsions and anticiaptroy care needs.  ? ?This NP visited patient at the bedside as a follow up for palliative medicine needs and emotional support.   Initial consult from 03/02/2022 when I met with patient's 3 daughters. ? ?Today is day 10 of this hospital stay.  Patient remains with altered mental status and inability to follow commands consistently.  She now is status post trach and PEG.  No family at bedside ? ?I spoke to daughter/ Sarah Myers  by phone.  Education offered today regarding patient's complicated medical situation.  Education offered regarding the best case scenario versus worst-case scenario as far as the natural trajectory of a patient with severe acute hemorrhage of the brain and her current sequale.       Patient is high risk for decompensation secondary to high risk for rebleed, infection and overall failure to thrive. ? ?Questions and concerns addressed ? ?Disposition pending. ? ?Plan of Care; ?- FULL CODE-  ?   -Educated patient/family to consider DNR/DNI status understanding evidenced based poor outcomes in similar  hospitalized patient, as the cause of arrest is likely associated with advanced chronic illness rather than an easily reversible acute cardio-pulmonary event.  ?-Family is open to all offered and available medical interventions to prolong life. ?-Disposition to SNF when bed is available ? ? ?Discussed with family  the importance of continued conversation with family and their  medical providers regarding overall plan of care and treatment options,  ensuring decisions are within the context of the patients values and GOCs. ? ? ?Wadie Lessen NP  ?Palliative Medicine Team Team Phone # [phone] ?Pager [phone] ?  ?

## 2022-03-15 NOTE — Progress Notes (Signed)
Speech Language Pathology Treatment: Sarah Myers Speaking valve;Dysphagia  ?Patient Details ?Name: Sarah Myers ?MRN: [id number] ?DOB: 07-21-1971 ?Today's Date: 03/15/2022 ?Time: 2549-8264 ?SLP Time Calculation (min) (ACUTE ONLY): 16 min ? ?Assessment / Plan / Recommendation ?Clinical Impression ? Pt alert and cooperative stating she doesn't feel as good this morning. Achieving voice around her trach which is more intense and stronger with longer words per breath once PMV donned. Valve remained in place throughout session for speech, swallow and cognition and no indications of air trapping. Speech can be hurried with articulatory distortion noted. Verbal reminders and demonstration for slower pace and over articulation which she continues to need feedback to increase intelligibility in sentences.  ?She did not recall discussion from yesterday re: situational orientation and was surprised to hear of her stroke. Therapist pointed out her lack of movement on left side to increase her overall awareness. Cannot utilize environmental cues as she cannot visualize/read signs on her wall. She states she sees "only one" therapist. Left neglect obvious but she did move her head past midline to visual and auditory stimuli on her left.  ?She stated she wanted applesauce to eat but could not be persuaded to eat this morning, only take straw sips Coke which were tolerated well. At one point she talked immediately after swallow and therapist reminded her to wait several seconds before speaking. She is making excellent progress toward her speech/swallow/cognitive and communication goals.  ?  ?HPI HPI: 51 y/o female came to the [facility] ER on 2/19 with left sided weakness and slurred speech.  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 Intubated 2/19. Trach changed from #6 cuffed to #4 cuffless on 3/16. Palliative NP met with patient's three daughters and decision was reached that they would like to see  how PO trials go and if not tolerated they are open to PEG; Pt mostly lethargic and asleep until NP adjusted meds and she is now alert, conversive and able to sustain alertness. ?  ?   ?SLP Plan ? Continue with current plan of care ? ?  ?  ?Recommendations for follow up therapy are one component of a multi-disciplinary discharge planning process, led by the attending physician.  Recommendations may be updated based on patient status, additional functional criteria and insurance authorization. ?  ? ?Recommendations  ?Diet recommendations: Dysphagia 2 (fine chop);Thin liquid ?Liquids provided via: Straw;Cup ?Medication Administration: Crushed with puree (or via PEG) ?Supervision: Staff to assist with self feeding;Full supervision/cueing for compensatory strategies ?Compensations: Slow rate;Small sips/bites;Lingual sweep for clearance of pocketing ?Postural Changes and/or Swallow Maneuvers: Seated upright 90 degrees  ?   ? Patient may use Passy-Muir Speech Valve: During all waking hours (remove during sleep) ?PMSV Supervision: Full  ?   ? ? ? ? Oral Care Recommendations: Oral care BID ?Follow Up Recommendations: Skilled nursing-short term rehab (<3 hours/day) ?Assistance recommended at discharge: Frequent or constant Supervision/Assistance ?SLP Visit Diagnosis: Dysphagia, unspecified (R13.10);Aphonia (R49.1);Cognitive communication deficit (R41.841) ?Plan: Continue with current plan of care ? ? ? ? ?  ?  ? ? ?Sarah Myers ? ?03/15/2022, 10:15 AM ?

## 2022-03-16 DIAGNOSIS — I619 Nontraumatic intracerebral hemorrhage, unspecified: Secondary | ICD-10-CM | POA: Diagnosis not present

## 2022-03-16 LAB — GLUCOSE, CAPILLARY
Glucose-Capillary: 141 mg/dL — ABNORMAL HIGH (ref 70–99)
Glucose-Capillary: 147 mg/dL — ABNORMAL HIGH (ref 70–99)
Glucose-Capillary: 105 mg/dL — ABNORMAL HIGH (ref 70–99)
Glucose-Capillary: 161 mg/dL — ABNORMAL HIGH (ref 70–99)
Glucose-Capillary: 117 mg/dL — ABNORMAL HIGH (ref 70–99)

## 2022-03-16 MED ORDER — MELATONIN 3 MG PO TABS
3.0000 mg | ORAL_TABLET | Freq: Once | ORAL | Status: AC
  Administered 2022-03-16: 3 mg via ORAL
  Filled 2022-03-16: qty 1

## 2022-03-16 NOTE — Progress Notes (Signed)
Patient is s/p G tube placement on 3/20.  ? ?Patient seen at bedside for possible T tacks removal.  ?No T tacks found.  ?G tube site clean and dry.  ? ?Willette Brace PA-C ?03/16/2022 2:52 PM ? ?  ? ?

## 2022-03-16 NOTE — Progress Notes (Signed)
Patient seen today by trach team for consult. No education needed at this time.  All the necessary equipment is at bedside.  Will continue to follow for progression. ?

## 2022-03-16 NOTE — Progress Notes (Addendum)
?Progress Note ? ? ?Patient: Sarah Myers [id number] DOB: 12/02/1971 DOA: 02/05/2022     39 ?DOS: the patient was seen and examined on 03/16/2022 ?  ?Brief hospital course: ?51 year old with history of hypertension presented to the emergency room with left-sided weakness and slurred speech.  She was found to have dense left hemiplegia and intracranial hemorrhage. ?2/19, admitted to intensive care unit with left-sided dense hemiplegia and slurred speech and found to have right intracranial hemorrhage with brain compression present on admission.  Treated with 3% saline, Cleviprex and intubated.  She failed intubation x2 and underwent tracheostomy on 2/28 by PCCM. ?2/28, tracheostomy. ?3/1 trach collar, off ventilator on 3/4. ?Transferred to medical floor on 3/6.  Cuffless trach on 3/16.  PEG tube placed on 3/20. ?Hospital course complicated by MSSA pneumonia, completed antibiotic therapy.   ?  ? 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 ?Continue Klonopin and Zyprexa for brain injury sequela; continue Robaxin for hypertonicity and Symmetrel for brain injury sequela ?SLP evaluated patient today and recommended to diet with thin liquids under supervision.  Recommends PMSV as tolerated ? 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 ?Tracheostomy dependent ([place]) ?2/28, tracheostomy ?3/1, trach collar. ?3/16, downsized to cuffless #4 trach.  Currently on trach collar with minimum oxygen. ?3/30 patient noted to be asleep with PMSV in place and tolerating without any respiratory distress ? ?Dysphagia due to  recent stroke/moderate PCM ?PEG tube placed by IR on 3/20 ?Continue tube feeding ?Change IV Heparin back to Eliquis ?Change Senokot to scheduled BID ?Continue D2 diet with thin liquids noting patient much more alert and more apt to tolerate this diet.  If intake improves may need to consider transitioning to nocturnal tube feedings ? ? ? ?Bilateral pulmonary embolism (HCC) ?Detected on CT angiogram 3/7.  ?Cleared for anticoagulation by neurology/Dr.Xu on 3/8  ?Patient was initially on heparin, then transitioned to Eliquis.   ?Currently has issues with abdominal distention of unclear etiology therefore all medications and tube feeding have been stopped per tube.  Discussed with pharmacy who recommended IV heparin as opposed to Lovenox. ? ? ?Malignant hypertension ?Presented with hypertensive emergency ?BP well controlled on Norvasc, carvedilol, Cozaar and Catapres ?Has as needed IV hydralazine as well as labetalol available ? ?Oral thrush ?Continue Diflucan-initiated 3/26-change to nystatin when allowed POs ? ?Iron deficiency anemia ?Current hemoglobin stable at 11.5 ?Consider regular per tube iron replacement ? 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 Subcutaneous fat may be visible but bone, tendon or muscle are NOT exposed. (Active)  ?Date First Assessed/Time First Assessed: 02/26/22 0443   Location: Cervical  Location Orientation: Left  Staging: Stage 3 -  Full thickness tissue loss. Subcutaneous fat may be visible but bone, tendon or muscle are NOT exposed.  Present on Admission: No  ?  ?Assessments 02/26/2022  4:44 AM 03/09/2022  8:00 AM  ?Dressing Type -- Foam - Lift dressing to assess site every shift  ?Dressing Clean, Dry, Intact Reinforced  ?Dressing Change Frequency -- Every 3 days  ?Peri-wound Assessment Erythema (blanchable) --  ?Margins Unattached edges (  unapproximated) Unattached edges (unapproximated)  ?Drainage Amount None --  ?   ?No  Linked orders to display  ? ? ?Counseling regarding goals of care ?High risk of rehemorrhage on anticoagulation.   ?Palliative care, family meeting 3/16.  She is very high risk of decompensation, she is very high risk of getting another bleeding.  Family requested full scope of treatment ?3/21 patient's daughter [name] at the bedside.  Given updates about brain hemorrhage, massive pulmonary embolism on blood thinners and challenging treatment options previous attending physician.  All questions were answered.  Since patient becomes very uncomfortable at night, we decided to keep on current doses of medications including Seroquel and pain medications. ? ? ? ? ?  ? ?Subjective:  ?Sleeping soundly again today. ? ?Physical Exam: ?Vitals:  ? 03/16/22 0327 03/16/22 0434 03/16/22 0806 03/16/22 0815  ?BP: (!) 169/119     ?Pulse: (!) 105 (!) 101 100   ?Resp: 20 20 20    ?Temp: 98.7 ?F (37.1 ?C)   99.4 ?F (37.4 ?C)  ?TempSrc: Oral   Oral  ?SpO2: 100% 100% 99%   ?Weight:      ?Height:      ? ?Constitutional: Sleeping today and did not awaken during my exam but at baseline awake and alert and mildly restless noting she continues to asked to go to the bathroom ?Respiratory: Lung sounds are clear to auscultation, has trach in place 4.0 cuffless to trach collar, noted PMSV in place, stable on 5 L O2-respiratory effort ?Cardiovascular: Regular rate and rhythm, No extremity edema.  Normotensive ?Abdomen: + tenderness when palpating directly over PEG site, Bowel sounds positive.  LBM 3/30 ?Musculoskeletal: no clubbing / cyanosis.  Generalized hypertonic muscle tone has significantly improved ?Neurologic: Sleeping but at baseline CN 2-12 appears to be grossly intact. Sensation appears to be grossly intact especially on the right.  Quickly follow simple commands with right upper extremity.  Unable to move right lower extremity as well.  Noted with apparent hyperesthesias on left upper extremity no movement left lower extremity.  Appears  to have a left hemianopsia ?Psychiatric: Sleeping soundly now but at baseline awake and very alert.  Oriented to name only.  Speaking more clearly ? ?Data Reviewed: ?There are no new results to review at this time. ? ?Family Communication:  ?Updated dtr Berdine Dance ? ?Disposition: ?Remains inpatient appropriate because:  ?Requires skilled nursing level of care due to tracheostomy tube, unresponsive state and ongoing need for tube feedings.  She is appropriate for SNF/LTAC ? ?Planned Discharge Destination:  ?Skilled nursing facility ? ?Medically stable ?Yes ? ?COVID vaccination status:  ?unknown ? ?Consultants: ?Neurosurgery ?PCCM ?Palliative medicine ?Internal radiology ?Procedures: ?Intubation ?Echocardiogram ?Follow-up echocardiogram with bubble study ?Cortrack tube ?Bedside tracheostomy tube placement ?PEG tube placement ?Antibiotics: ?IV vancomycin x1 dose on 2/23 ?Cefepime 2/23 through 3/1 ?Azithromycin 3/7 ?Cefepime 3/7, 3/8 ?Vancomycin 3/7 ?Cefazolin 3/8 through 3/16 ?Fluconazole 3/19 ?Cefazolin 3/20 ? ? ? ? ?Time spent: 45 minutes ? ?Author: ?Erin Hearing, NP ?03/16/2022 10:48 AM ? ?For on call review [email or web address].  ?

## 2022-03-16 NOTE — Progress Notes (Signed)
Physical Therapy Treatment ?Patient Details ?Name: Sarah Myers ?MRN: [id number] ?DOB: 28-Jun-1971 ?Today's Date: 03/16/2022 ? ? ?History of Present Illness 51 y.o. female presents to [facility] hospital on 02/05/2022 with L hemiplegia and dysarthria. 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 Pt intubated 2/19. Attempted extubation 2/23 and 2/26, requiring reintubation same date. Tracheostomy 02/14/2022. PMH includes uncontrolled HTN ? ?  ?PT Comments  ? ? Pt was awake restless and participative.  Emphasis on transitions, scooting, sitting balance, sit to stands x4 with pregait activity.  Pt needed +2 mod assist in general. ?   ?Recommendations for follow up therapy are one component of a multi-disciplinary discharge planning process, led by the attending physician.  Recommendations may be updated based on patient status, additional functional criteria and insurance authorization. ? ?Follow Up Recommendations ? Skilled nursing-short term rehab (<3 hours/day) ?  ?  ?Assistance Recommended at Discharge Frequent or constant Supervision/Assistance  ?Patient can return home with the following Two people to help with walking and/or transfers;Two people to help with bathing/dressing/bathroom;Assistance with cooking/housework;Assistance with feeding;Direct supervision/assist for financial management;Direct supervision/assist for medications management;Help with stairs or ramp for entrance;Assist for transportation ?  ?Equipment Recommendations ? Wheelchair (measurements PT);Wheelchair cushion (measurements PT);Hospital bed;Other (comment)  ?  ?Recommendations for Other Services   ? ? ?  ?Precautions / Restrictions Precautions ?Precautions: Fall ?Precaution Comments: L inattention, PEG, trach  ?  ? ?Mobility ? Bed Mobility ?Overal bed mobility: Needs Assistance ?Bed Mobility: Supine to Sit, Sit to Supine ?  ?  ?Supine to sit: Mod assist, +2 for physical assistance ?Sit to supine: Mod assist, +2 for  physical assistance ?  ?General bed mobility comments: pt initiated movement of LE's to EOB, needing L LE assist with truncal assist up and pivot toward EOB.  Scooted to EOB with mod to light max, rocking to build momentum. ?  ? ?Transfers ?Overall transfer level: Needs assistance ?Equipment used: Rolling walker (2 wheels) (chair back) ?Transfers: Sit to/from Stand ?Sit to Stand: Mod assist, +2 physical assistance ?  ?  ?  ?  ?  ?General transfer comment: x4, 2 with RW and 2 with chairback.  Working on w/shifting, upright stance "at attention" with further work on knee control ?  ? ?Ambulation/Gait ?  ?  ?  ?  ?  ?  ?  ?  ? ? ?Stairs ?  ?  ?  ?  ?  ? ? ?Wheelchair Mobility ?  ? ?Modified Rankin (Stroke Patients Only) ?Modified Rankin (Stroke Patients Only) ?Modified Rankin: Severe disability ? ? ?  ?Balance   ?Sitting-balance support: Feet supported, Single extremity supported ?Sitting balance-Leahy Scale: Poor ?Sitting balance - Comments: worked on sitting balance, attention L.  Pt biased L, but with cuing could correct toward and overshoot to the R side. ?Postural control: Left lateral lean, Posterior lean ?Standing balance support: Bilateral upper extremity supported ?Standing balance-Leahy Scale: Poor ?Standing balance comment: stood xr at EOB working on balance and pregait activity. ?  ?  ?  ?  ?  ?  ?  ?  ?  ?  ?  ?  ? ?  ?Cognition Arousal/Alertness: Awake/alert ?Behavior During Therapy: Restless ?Overall Cognitive Status: Impaired/Different from baseline ?Area of Impairment: Attention, Following commands, Safety/judgement, Problem solving ?  ?  ?  ?  ?  ?  ?  ?  ?  ?Current Attention Level: Focused, Sustained ?  ?Following Commands: Follows one step commands with increased time ?Safety/Judgement:  Decreased awareness of safety, Decreased awareness of deficits ?  ?Problem Solving: Slow processing ?  ?  ?  ? ?  ?Exercises   ? ?  ?General Comments General comments (skin integrity, edema, etc.): vss on RA,  replaced 28% FiO2 at end of session. ?  ?  ? ?Pertinent Vitals/Pain Pain Assessment ?Pain Assessment: Faces ?Faces Pain Scale: No hurt ?Pain Intervention(s): Monitored during session, Repositioned  ? ? ?Home Living   ?  ?  ?  ?  ?  ?  ?  ?  ?  ?   ?  ?Prior Function    ?  ?  ?   ? ?PT Goals (current goals can now be found in the care plan section) Acute Rehab PT Goals ?Patient Stated Goal: I want to walk out to the hall. ?PT Goal Formulation: Patient unable to participate in goal setting ?Time For Goal Achievement: 03/21/22 ?Potential to Achieve Goals: Fair ?Progress towards PT goals: Progressing toward goals ? ?  ?Frequency ? ? ? Min 3X/week ? ? ? ?  ?PT Plan Current plan remains appropriate  ? ? ?Co-evaluation   ?  ?  ?  ?  ? ?  ?AM-PAC PT "6 Clicks" Mobility   ?Outcome Measure ? Help needed turning from your back to your side while in a flat bed without using bedrails?: A Lot ?Help needed moving from lying on your back to sitting on the side of a flat bed without using bedrails?: A Lot ?Help needed moving to and from a bed to a chair (including a wheelchair)?: Total ?Help needed standing up from a chair using your arms (e.g., wheelchair or bedside chair)?: Total ?Help needed to walk in hospital room?: Total ?Help needed climbing 3-5 steps with a railing? : Total ?6 Click Score: 8 ? ?  ?End of Session   ?Activity Tolerance: Patient tolerated treatment well ?Patient left: in bed;with call bell/phone within reach;with bed alarm set ?Nurse Communication: Mobility status ?PT Visit Diagnosis: Other abnormalities of gait and mobility (R26.89);Hemiplegia and hemiparesis ?Hemiplegia - Right/Left: Left ?Hemiplegia - dominant/non-dominant: Dominant ?Hemiplegia - caused by: Nontraumatic intracerebral hemorrhage ?  ? ? ?Time: 4403-4742 ?PT Time Calculation (min) (ACUTE ONLY): 35 min ? ?Charges:  $Therapeutic Activity: 8-22 mins ?$Neuromuscular Re-education: 8-22 mins          ?          ? ?03/16/2022 ? ?Jacinto Halim., PT ?Acute  Rehabilitation Services ?[phone]  (pager) ?[phone]  (office) ? ? ?Eliseo Gum Foye Haggart ?03/16/2022, 5:50 PM ? ?

## 2022-03-17 LAB — COMPREHENSIVE METABOLIC PANEL
ALT: 28 U/L (ref 0–44)
AST: 22 U/L (ref 15–41)
Albumin: 3.7 g/dL (ref 3.5–5.0)
Alkaline Phosphatase: 73 U/L (ref 38–126)
Anion gap: 8 (ref 5–15)
BUN: 44 mg/dL — ABNORMAL HIGH (ref 6–20)
CO2: 26 mmol/L (ref 22–32)
Calcium: 11.1 mg/dL — ABNORMAL HIGH (ref 8.9–10.3)
Chloride: 108 mmol/L (ref 98–111)
Creatinine, Ser: 1.07 mg/dL — ABNORMAL HIGH (ref 0.44–1.00)
GFR, Estimated: >60 mL/min (ref >60)
Glucose, Bld: 101 mg/dL — ABNORMAL HIGH (ref 70–99)
Potassium: 3.9 mmol/L (ref 3.5–5.1)
Sodium: 142 mmol/L (ref 135–145)
Total Bilirubin: 0.5 mg/dL (ref 0.3–1.2)
Total Protein: 7.6 g/dL (ref 6.5–8.1)

## 2022-03-17 LAB — CBC WITH DIFFERENTIAL/PLATELET
Abs Immature Granulocytes: 0.02 10*3/uL (ref 0.00–0.07)
Basophils Absolute: 0.1 10*3/uL (ref 0.0–0.1)
Basophils Relative: 1 %
Eosinophils Absolute: 0.3 10*3/uL (ref 0.0–0.5)
Eosinophils Relative: 3 %
HCT: 36.9 % (ref 36.0–46.0)
Hemoglobin: 11.8 g/dL — ABNORMAL LOW (ref 12.0–15.0)
Immature Granulocytes: 0 %
Lymphocytes Relative: 28 %
Lymphs Abs: 2.4 10*3/uL (ref 0.7–4.0)
MCH: 29.3 pg (ref 26.0–34.0)
MCHC: 32 g/dL (ref 30.0–36.0)
MCV: 91.6 fL (ref 80.0–100.0)
Monocytes Absolute: 0.7 10*3/uL (ref 0.1–1.0)
Monocytes Relative: 8 %
Neutro Abs: 5.3 10*3/uL (ref 1.7–7.7)
Neutrophils Relative %: 60 %
Platelets: 175 10*3/uL (ref 150–400)
RBC: 4.03 MIL/uL (ref 3.87–5.11)
RDW: 14.2 % (ref 11.5–15.5)
WBC: 8.6 10*3/uL (ref 4.0–10.5)
nRBC: 0 % (ref 0.0–0.2)

## 2022-03-17 LAB — GLUCOSE, CAPILLARY
Glucose-Capillary: 116 mg/dL — ABNORMAL HIGH (ref 70–99)
Glucose-Capillary: 140 mg/dL — ABNORMAL HIGH (ref 70–99)
Glucose-Capillary: 141 mg/dL — ABNORMAL HIGH (ref 70–99)
Glucose-Capillary: 160 mg/dL — ABNORMAL HIGH (ref 70–99)
Glucose-Capillary: 168 mg/dL — ABNORMAL HIGH (ref 70–99)
Glucose-Capillary: 198 mg/dL — ABNORMAL HIGH (ref 70–99)

## 2022-03-17 MED ORDER — ORAL CARE MOUTH RINSE
15.0000 mL | Freq: Two times a day (BID) | OROMUCOSAL | Status: DC
  Administered 2022-03-18 – 2022-03-21 (×8): 15 mL via OROMUCOSAL

## 2022-03-17 MED ORDER — CHLORHEXIDINE GLUCONATE 0.12 % MT SOLN
15.0000 mL | Freq: Two times a day (BID) | OROMUCOSAL | Status: DC
  Administered 2022-03-17 – 2022-03-21 (×9): 15 mL via OROMUCOSAL
  Filled 2022-03-17 (×9): qty 15

## 2022-03-17 MED ORDER — FREE WATER
200.0000 mL | Status: DC
  Administered 2022-03-17 – 2022-03-22 (×27): 200 mL

## 2022-03-17 NOTE — Plan of Care (Signed)
?  Problem: Education: ?Goal: Knowledge of General Education information will improve ?Description: Including pain rating scale, medication(s)/side effects and non-pharmacologic comfort measures ?Outcome: Progressing ?  ?Problem: Health Behavior/Discharge Planning: ?Goal: Ability to manage health-related needs will improve ?Outcome: Progressing ?  ?Problem: Clinical Measurements: ?Goal: Ability to maintain clinical measurements within normal limits will improve ?Outcome: Progressing ?Goal: Will remain free from infection ?Outcome: Progressing ?Goal: Diagnostic test results will improve ?Outcome: Progressing ?Goal: Respiratory complications will improve ?Outcome: Progressing ?Goal: Cardiovascular complication will be avoided ?Outcome: Progressing ?  ?Problem: Activity: ?Goal: Risk for activity intolerance will decrease ?Outcome: Progressing ?  ?Problem: Nutrition: ?Goal: Adequate nutrition will be maintained ?Outcome: Progressing ?  ?Problem: Coping: ?Goal: Level of anxiety will decrease ?Outcome: Progressing ?  ?Problem: Elimination: ?Goal: Will not experience complications related to bowel motility ?Outcome: Progressing ?Goal: Will not experience complications related to urinary retention ?Outcome: Progressing ?  ?Problem: Pain Managment: ?Goal: General experience of comfort will improve ?Outcome: Progressing ?  ?Problem: Safety: ?Goal: Ability to remain free from injury will improve ?Outcome: Progressing ?  ?Problem: Skin Integrity: ?Goal: Risk for impaired skin integrity will decrease ?Outcome: Progressing ?  ?Problem: Education: ?Goal: Knowledge of disease or condition will improve ?Outcome: Progressing ?Goal: Knowledge of patient specific risk factors will improve (INDIVIDUALIZE FOR PATIENT) ?Outcome: Progressing ?  ?

## 2022-03-17 NOTE — Progress Notes (Signed)
Physical Therapy Treatment ?Patient Details ?Name: Sarah Myers ?MRN: [id number] ?DOB: Aug 15, 1971 ?Today's Date: 03/17/2022 ? ? ?History of Present Illness 51 y.o. female presents to [facility] hospital on 02/05/2022 with L hemiplegia and dysarthria. 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 Pt intubated 2/19. Attempted extubation 2/23 and 2/26, requiring reintubation same date. Tracheostomy 02/14/2022. PMH includes uncontrolled HTN ? ?  ?PT Comments  ? ? Pt continues to be able to participate more.  She needs maximal redirection cues and significant cuing overall for sequencing of tasks.  Emphasis on transition to EOB, scooting technique, sitting balance at EOB, sit to stands x3 from bed and x1 from chair.  Transfer to the chair and 1 pre gait attempt to show pt she is not yet ready to walk. ?   ?Recommendations for follow up therapy are one component of a multi-disciplinary discharge planning process, led by the attending physician.  Recommendations may be updated based on patient status, additional functional criteria and insurance authorization. ? ?Follow Up Recommendations ? Skilled nursing-short term rehab (<3 hours/day) ?  ?  ?Assistance Recommended at Discharge Frequent or constant Supervision/Assistance  ?Patient can return home with the following Two people to help with walking and/or transfers;Two people to help with bathing/dressing/bathroom;Assistance with cooking/housework;Assistance with feeding;Direct supervision/assist for financial management;Direct supervision/assist for medications management;Help with stairs or ramp for entrance;Assist for transportation ?  ?Equipment Recommendations ? Wheelchair (measurements PT);Wheelchair cushion (measurements PT);Hospital bed;Other (comment)  ?  ?Recommendations for Other Services Rehab consult ? ? ?  ?Precautions / Restrictions Precautions ?Precautions: Fall ?Precaution Comments: L inattention, PEG, trach  ?  ? ?Mobility ? Bed  Mobility ?Overal bed mobility: Needs Assistance ?Bed Mobility: Supine to Sit ?  ?  ?Supine to sit: Mod assist, +2 for physical assistance ?  ?  ?General bed mobility comments: repetitive cues to help pt focus and initiate correct task.  pt needed mod assist of L LE and coordination of R LE  Truncal assist up and foward.  Pt stuck cognitively squewed at EOB.  Multiple cues then assist to square up at EOB. ?  ? ?Transfers ?Overall transfer level: Needs assistance ?Equipment used: 2 person hand held assist, 1 person hand held assist ?Transfers: Sit to/from Stand, Bed to chair/wheelchair/BSC ?Sit to Stand: Mod assist, +2 physical assistance ?Stand pivot transfers: Mod assist, +2 physical assistance ?  ?  ?  ?  ?General transfer comment: sit to stand x3 for balance work, peri care and to work on focus on task. Face to face standing and pivot to chair on the left. ?  ? ?Ambulation/Gait ?  ?  ?  ?  ?  ?  ?  ?General Gait Details: Stood with intention to give pt understanding that she was not yet ready to walk.  Work to w/shift, [employer] and step.  pt was unsuccessful in w/shifting, stepping or ability to stay on facus. ? ? ?Stairs ?  ?  ?  ?  ?  ? ? ?Wheelchair Mobility ?  ? ?Modified Rankin (Stroke Patients Only) ?Modified Rankin (Stroke Patients Only) ?Pre-Morbid Rankin Score: No symptoms ?Modified Rankin: Severe disability ? ? ?  ?Balance Overall balance assessment: Needs assistance ?Sitting-balance support: Single extremity supported, No upper extremity supported, Feet supported ?Sitting balance-Leahy Scale: Poor ?Sitting balance - Comments: today biased left and unable to correct or overcorrect back to the right. ?  ?  ?Standing balance-Leahy Scale: Poor ?Standing balance comment: stood x3 from bed and x1 from  chair to attempt pre gait activity. ?  ?  ?  ?  ?  ?  ?  ?  ?  ?  ?  ?  ? ?  ?Cognition Arousal/Alertness: Awake/alert ?Behavior During Therapy: Restless ?Overall Cognitive Status: Impaired/Different from  baseline ?Area of Impairment: Attention, Following commands, Safety/judgement, Problem solving ?  ?  ?  ?  ?  ?  ?  ?  ?  ?Current Attention Level: Sustained, Focused ?  ?Following Commands: Follows one step commands with increased time ?Safety/Judgement: Decreased awareness of safety, Decreased awareness of deficits ?  ?Problem Solving: Slow processing ?General Comments: Pt requiring increased time, multimodal cuing, and demonstrates poor awarness into her deficits, asking about whether she is ready for home today or tomorrow if not today. ?  ?  ? ?  ?Exercises   ? ?  ?General Comments General comments (skin integrity, edema, etc.): vss on RA with PMV on trach ?  ?  ? ?Pertinent Vitals/Pain Pain Assessment ?Pain Assessment: Faces ?Faces Pain Scale: No hurt ?Pain Intervention(s): Monitored during session  ? ? ?Home Living   ?  ?  ?  ?  ?  ?  ?  ?  ?  ?   ?  ?Prior Function    ?  ?  ?   ? ?PT Goals (current goals can now be found in the care plan section) Acute Rehab PT Goals ?PT Goal Formulation: Patient unable to participate in goal setting ?Time For Goal Achievement: 03/21/22 ?Potential to Achieve Goals: Fair ?Progress towards PT goals: Progressing toward goals ? ?  ?Frequency ? ? ? Min 3X/week ? ? ? ?  ?PT Plan Current plan remains appropriate  ? ? ?Co-evaluation   ?  ?  ?  ?  ? ?  ?AM-PAC PT "6 Clicks" Mobility   ?Outcome Measure ? Help needed turning from your back to your side while in a flat bed without using bedrails?: A Lot ?Help needed moving from lying on your back to sitting on the side of a flat bed without using bedrails?: A Lot ?Help needed moving to and from a bed to a chair (including a wheelchair)?: Total ?Help needed standing up from a chair using your arms (e.g., wheelchair or bedside chair)?: Total ?Help needed to walk in hospital room?: Total ?Help needed climbing 3-5 steps with a railing? : Total ?6 Click Score: 8 ? ?  ?End of Session   ?Activity Tolerance: Patient tolerated treatment  well ?Patient left: in chair;with call bell/phone within reach;Other (comment) ([place] alarm) ?Nurse Communication: Mobility status ?PT Visit Diagnosis: Other abnormalities of gait and mobility (R26.89);Hemiplegia and hemiparesis ?Hemiplegia - Right/Left: Left ?Hemiplegia - dominant/non-dominant: Dominant ?Hemiplegia - caused by: Nontraumatic intracerebral hemorrhage ?  ? ? ?Time: 6761-9509 ?PT Time Calculation (min) (ACUTE ONLY): 26 min ? ?Charges:  $Therapeutic Activity: 8-22 mins ?$Neuromuscular Re-education: 8-22 mins          ?          ? ?03/17/2022 ? ?Jacinto Halim., PT ?Acute Rehabilitation Services ?[phone]  (pager) ?[phone]  (office) ? ? ?Sarah Myers ?03/17/2022, 5:07 PM ? ?

## 2022-03-17 NOTE — Progress Notes (Signed)
?Progress Note ?  ?  ?Patient: Sarah Myers [id number] DOB: 03-02-1971 DOA: 02/05/2022     39 ?DOS: the patient was seen and examined on 03/16/2022 ?  ?Brief hospital course: ?51 year old with history of hypertension presented to the emergency room with left-sided weakness and slurred speech.  She was found to have dense left hemiplegia and intracranial hemorrhage. ?2/19, admitted to intensive care unit with left-sided dense hemiplegia and slurred speech and found to have right intracranial hemorrhage with brain compression present on admission.  Treated with 3% saline, Cleviprex and intubated.  She failed intubation x2 and underwent tracheostomy on 2/28 by PCCM. ?2/28, tracheostomy. ?3/1 trach collar, off ventilator on 3/4. ?Transferred to medical floor on 3/6.  Cuffless trach on 3/16.  PEG tube placed on 3/20. ?Hospital course complicated by MSSA pneumonia, completed antibiotic therapy.   ?  ?  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 ?Continue Klonopin and Zyprexa for brain injury sequela; continue Robaxin for hypertonicity and Symmetrel for brain injury sequela ?SLP evaluated patient and recommended dysphagia 2 diet with thin liquids under supervision.  Recommends PMSV as tolerated ?  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  ?Tracheostomy dependent ([place]) ?2/28, tracheostomy ?3/1, trach collar. ?3/16, downsized to cuffless #4 trach.  Currently on trach collar with minimum oxygen. ?3/30 patient noted to be asleep with PMSV in place and tolerating without any respiratory distress ?  ?Dysphagia due  to recent stroke/moderate PCM ?PEG tube placed by IR on 3/20 ?Continue tube feeding ?Change IV Heparin back to Eliquis ?Change Senokot to scheduled BID ?Continue D2 diet with thin liquids noting patient much more alert and more apt to tolerate this diet.  If intake improves may need to consider transitioning to nocturnal tube feedings ?  ?Mild hypercalcemia ?Calcium has been steadily trending upward over the past several weeks with current reading 11.1 ?Likely related to volume depletion from insensible fluid loss from baseline trach ?We will increase free water per tube from every 8 hours to every 4 hours ?Have asked pharmacy to review any tube feeding, mineral supplements or medications that could contribute to mild hypercalcemia ?Avoid calcium supplements including medications such as Tums ?Follow Labs ?  ?Bilateral pulmonary embolism (HCC) ?Detected on CT angiogram 3/7.  ?Cleared for anticoagulation by neurology/Dr.Xu on 3/8  ?Patient was initially on heparin, then transitioned to Eliquis.   ?Currently has issues with abdominal distention of unclear etiology therefore all medications and tube feeding have been stopped per tube.  Discussed with pharmacy who recommended IV heparin as opposed to Lovenox. ?  ?  ?Malignant hypertension ?Presented with hypertensive emergency ?BP well controlled on Norvasc, carvedilol, Cozaar and Catapres ?Has as needed IV hydralazine as well as labetalol available ?  ?Oral thrush ?Continue Diflucan-initiated 3/26-change to nystatin when allowed POs ?  ?Iron deficiency anemia ?Current hemoglobin stable at 11.5 ?Consider regular per tube iron replacement ?  ?Pressure injury of skin ?    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 Subcutaneous fat may be visible but bone, tendon or muscle are NOT exposed. (Active)  ?Date First Assessed/Time First  Assessed: 02/26/22 0443   Location: Cervical  Location Orientation: Left  Staging: Stage 3 -  Full thickness  tissue loss. Subcutaneous fat may be visible but bone, tendon or muscle are NOT exposed.  Present on Admission: No  ?   ?Assessments 02/26/2022  4:44 AM 03/09/2022  8:00 AM  ?Dressing Type -- Foam - Lift dressing to assess site every shift  ?Dressing Clean, Dry, Intact Reinforced  ?Dressing Change Frequency -- Every 3 days  ?Peri-wound Assessment Erythema (blanchable) --  ?Margins Unattached edges (unapproximated) Unattached edges (unapproximated)  ?Drainage Amount None --  ?   ?No Linked orders to display  ?  ?  ?Counseling regarding goals of care ?High risk of rehemorrhage on anticoagulation.   ?Palliative care, family meeting 3/16.  She is very high risk of decompensation, she is very high risk of getting another bleeding.  Family requested full scope of treatment ?3/21 patient's daughter [name] at the bedside.  Given updates about brain hemorrhage, massive pulmonary embolism on blood thinners and challenging treatment options previous attending physician.  All questions were answered.  Since patient becomes very uncomfortable at night, we decided to keep on current doses of medications including Seroquel and pain medications. ?  ? ?Subjective:  ?Awake and alert.  No specific complaints.  Speech at times difficult to understand. ?  ?Physical Exam: ?      ?Vitals:  ?  03/16/22 0327 03/16/22 3903 03/16/22 0092 03/16/22 0815  ?BP: (!) 169/119        ?Pulse: (!) 105 (!) 101 100    ?Resp: 20 20 20     ?Temp: 98.7 ?F (37.1 ?C)     99.4 ?F (37.4 ?C)  ?TempSrc: Oral     Oral  ?SpO2: 100% 100% 99%    ?Weight:          ?Height:          ?  ?Constitutional: Awake and alert ?Respiratory: Lung sounds are clear to auscultation, has trach in place 4.0 cuffless to trach collar, noted PMSV in place, stable on 5 L O2-respiratory effort ?Cardiovascular: Regular rate and rhythm, No extremity edema.  Normotensive ?Abdomen: + tenderness when palpating directly over PEG site, Bowel sounds positive.  LBM 3/30 ?Musculoskeletal: no clubbing  / cyanosis.  Generalized hypertonic muscle tone has significantly improved ?Neurologic: CN 2-12 appears to be grossly intact. Sensation appears to be grossly intact especially on the right.  Quickly follow simple commands with right upper extremity.  Unable to move right lower extremity as well.  Noted with apparent hyperesthesias on left upper extremity no movement left lower extremity.  Appears to have a left hemianopsia ?Psychiatric: Awake and alert.  Oriented to name and place.  Not to year. ?  ?Data Reviewed: ?There are no new results to review at this time. ?  ?Family Communication:  ?Updated dtr [place] Dach ?  ?Disposition: ?Remains inpatient appropriate because:  ?Requires skilled nursing level of care due to tracheostomy tube, unresponsive state and ongoing need for tube feedings.  She is appropriate for SNF/LTAC ?  ?Planned Discharge Destination:  ?Skilled nursing facility ?  ?Medically stable ?Yes ?  ?COVID vaccination status:  ?unknown ?  ?Consultants: ?Neurosurgery ?PCCM ?Palliative medicine ?Internal radiology ?Procedures: ?Intubation ?Echocardiogram ?Follow-up echocardiogram with bubble study ?Cortrack tube ?Bedside tracheostomy tube placement ?PEG tube placement ?Antibiotics: ?IV vancomycin x1 dose on 2/23 ?Cefepime 2/23 through 3/1 ?Azithromycin 3/7 ?Cefepime 3/7, 3/8 ?Vancomycin 3/7 ?Cefazolin 3/8 through 3/16 ?Fluconazole 3/19 ?Cefazolin 3/20 ?  ?  ?  ?  ?  Time spent: 45 minutes ?  ?Author: ?Erin Hearing, NP ?03/16/2022 10:48 AM ?

## 2022-03-17 NOTE — TOC Progression Note (Addendum)
Transition of Care (TOC) - Progression Note  ? ? ?Patient Details  ?Name: Sarah Myers ?MRN: [id number] ?Date of Birth: 1971-04-09 ? ?Transition of Care (TOC) CM/SW Contact  ?Curlene Labrum, RN ?Phone Number: ?03/17/2022, 7:55 AM ? ?Clinical Narrative:    ?CM spoke with Arley Phenix, CM with [facility] is offering a review of patient's current clinicals and likely bed offer for admission.  I will speak with the patient and daughter, Quanita Wolverton this morning to offer likely bed offer at the facility and start of insurance authorization if appropriate for placement. ? ?03/17/2022 0804 - CM called and left a message with [name], daughter to discuss potential bed offer and review of clinicals with [facility]. ? ?CM and MSW with DTP Team will continue to follow the patient for SNF versus LTAC placement. ? ?I spoke with the patient/ daughter - Hisae Cougill at the bedside and she is aware of patient insurance authorization with [facility].  Laurali Kurlander was given contact number to [employer] to follow up regarding monthly payments to keep the insurance provider active. ? ?Expected Discharge Plan: [place] ?Barriers to Discharge: Continued Medical Work up ? ?Expected Discharge Plan and Services ?Expected Discharge Plan: [place] ?In-house Referral: Clinical Social Work, Development worker, community ?Discharge Planning Services: CM Consult ?Post Acute Care Choice: [place] ?Living arrangements for the past 2 months: Apartment ?                ?  ?  ?  ?  ?  ?  ?  ?  ?  ?  ? ? ?Social Determinants of Health (SDOH) Interventions ?  ? ?Readmission Risk Interventions ? ?  03/09/2022  ?  9:54 AM  ?Readmission Risk Prevention Plan  ?Transportation Screening Complete  ?PCP or Specialist Appt within 3-5 Days Complete  ?[place] or Home Care Consult Not Complete  ?Social Work Consult for  [place] Planning/Counseling Complete  ?Medication Review Press photographer) Complete  ? ? ?

## 2022-03-18 LAB — GLUCOSE, CAPILLARY
Glucose-Capillary: 105 mg/dL — ABNORMAL HIGH (ref 70–99)
Glucose-Capillary: 126 mg/dL — ABNORMAL HIGH (ref 70–99)
Glucose-Capillary: 132 mg/dL — ABNORMAL HIGH (ref 70–99)
Glucose-Capillary: 136 mg/dL — ABNORMAL HIGH (ref 70–99)
Glucose-Capillary: 145 mg/dL — ABNORMAL HIGH (ref 70–99)
Glucose-Capillary: 156 mg/dL — ABNORMAL HIGH (ref 70–99)

## 2022-03-18 LAB — BASIC METABOLIC PANEL
Anion gap: 7 (ref 5–15)
BUN: 49 mg/dL — ABNORMAL HIGH (ref 6–20)
CO2: 25 mmol/L (ref 22–32)
Calcium: 10.9 mg/dL — ABNORMAL HIGH (ref 8.9–10.3)
Chloride: 109 mmol/L (ref 98–111)
Creatinine, Ser: 0.99 mg/dL (ref 0.44–1.00)
GFR, Estimated: >60 mL/min (ref >60)
Glucose, Bld: 133 mg/dL — ABNORMAL HIGH (ref 70–99)
Potassium: 3.9 mmol/L (ref 3.5–5.1)
Sodium: 141 mmol/L (ref 135–145)

## 2022-03-18 MED ORDER — ALUM & MAG HYDROXIDE-SIMETH 200-200-20 MG/5ML PO SUSP
15.0000 mL | Freq: Four times a day (QID) | ORAL | Status: DC | PRN
  Administered 2022-03-19: 15 mL via ORAL
  Filled 2022-03-18: qty 30

## 2022-03-18 NOTE — Progress Notes (Signed)
Patient seen and examined this morning.  Previous progress notes reviewed. ? ?Patient somnolent this morning.  Opens her eyes briefly but went back to sleep.  Not very communicative. ? ?Noted to be afebrile.  Other vital signs are stable. ? ?Tracheostomy is noted ?Diminished air entry at the bases.  Mostly clear to auscultation. ?S1-S2 is normal regular. ?Abdomen is soft.  PEG tube is noted. ? ?Labs reviewed this morning.  Calcium noted to be 10.9.  Noted to be 11.1 yesterday. ? ?Patient noted to have mild hypercalcemia over the last 3 weeks.  Free water was increased as it was felt that she could be hypovolemic.  Nutritional supplement, Juven, was discontinued since it could be contributing to her hypercalcemia.  We will recheck her calcium level on Monday.  If it remains persistently elevated then may have to do further work-up. ? ?Her other medical issues are stable.  She was admitted with acute hemorrhagic stroke and respiratory failure.  Required tracheostomy and PEG tube placement.  Noted to have pulmonary embolism and currently on anticoagulation. ? ?Please review progress note from 03/17/22 by Junious Silk for details. ? ?We will continue to monitor. ? ?Osvaldo Shipper ?03/18/2022 ? ?

## 2022-03-18 NOTE — Plan of Care (Signed)
?  Problem: Education: ?Goal: Knowledge of General Education information will improve ?Description: Including pain rating scale, medication(s)/side effects and non-pharmacologic comfort measures ?03/18/2022 0529 by Brooke Bonito, RN ?Outcome: Progressing ?03/17/2022 2027 by Brooke Bonito, RN ?Outcome: Progressing ?  ?Problem: Health Behavior/Discharge Planning: ?Goal: Ability to manage health-related needs will improve ?03/18/2022 0529 by Brooke Bonito, RN ?Outcome: Progressing ?03/17/2022 2027 by Brooke Bonito, RN ?Outcome: Progressing ?  ?Problem: Clinical Measurements: ?Goal: Ability to maintain clinical measurements within normal limits will improve ?03/18/2022 0529 by Brooke Bonito, RN ?Outcome: Progressing ?03/17/2022 2027 by Brooke Bonito, RN ?Outcome: Progressing ?Goal: Will remain free from infection ?03/18/2022 0529 by Brooke Bonito, RN ?Outcome: Progressing ?03/17/2022 2027 by Brooke Bonito, RN ?Outcome: Progressing ?Goal: Diagnostic test results will improve ?03/18/2022 0529 by Brooke Bonito, RN ?Outcome: Progressing ?03/17/2022 2027 by Brooke Bonito, RN ?Outcome: Progressing ?Goal: Respiratory complications will improve ?03/18/2022 0529 by Brooke Bonito, RN ?Outcome: Progressing ?03/17/2022 2027 by Brooke Bonito, RN ?Outcome: Progressing ?Goal: Cardiovascular complication will be avoided ?03/18/2022 0529 by Brooke Bonito, RN ?Outcome: Progressing ?03/17/2022 2027 by Brooke Bonito, RN ?Outcome: Progressing ?  ?Problem: Activity: ?Goal: Risk for activity intolerance will decrease ?03/18/2022 0529 by Brooke Bonito, RN ?Outcome: Progressing ?03/17/2022 2027 by Brooke Bonito, RN ?Outcome: Progressing ?  ?Problem: Nutrition: ?Goal: Adequate nutrition will be maintained ?03/18/2022 0529 by Brooke Bonito, RN ?Outcome: Progressing ?03/17/2022 2027 by Brooke Bonito, RN ?Outcome: Progressing ?  ?Problem: Coping: ?Goal: Level of anxiety will decrease ?03/18/2022 0529 by Brooke Bonito, RN ?Outcome: Progressing ?03/17/2022 2027 by Brooke Bonito,  RN ?Outcome: Progressing ?  ?Problem: Elimination: ?Goal: Will not experience complications related to bowel motility ?03/18/2022 0529 by Brooke Bonito, RN ?Outcome: Progressing ?03/17/2022 2027 by Brooke Bonito, RN ?Outcome: Progressing ?Goal: Will not experience complications related to urinary retention ?03/18/2022 0529 by Brooke Bonito, RN ?Outcome: Progressing ?03/17/2022 2027 by Brooke Bonito, RN ?Outcome: Progressing ?  ?Problem: Pain Managment: ?Goal: General experience of comfort will improve ?03/18/2022 0529 by Brooke Bonito, RN ?Outcome: Progressing ?03/17/2022 2027 by Brooke Bonito, RN ?Outcome: Progressing ?  ?Problem: Safety: ?Goal: Ability to remain free from injury will improve ?03/18/2022 0529 by Brooke Bonito, RN ?Outcome: Progressing ?03/17/2022 2027 by Brooke Bonito, RN ?Outcome: Progressing ?  ?Problem: Skin Integrity: ?Goal: Risk for impaired skin integrity will decrease ?03/18/2022 0529 by Brooke Bonito, RN ?Outcome: Progressing ?03/17/2022 2027 by Brooke Bonito, RN ?Outcome: Progressing ?  ?Problem: Education: ?Goal: Knowledge of disease or condition will improve ?03/18/2022 0529 by Brooke Bonito, RN ?Outcome: Progressing ?03/17/2022 2027 by Brooke Bonito, RN ?Outcome: Progressing ?Goal: Knowledge of patient specific risk factors will improve (INDIVIDUALIZE FOR PATIENT) ?03/18/2022 0529 by Brooke Bonito, RN ?Outcome: Progressing ?03/17/2022 2027 by Brooke Bonito, RN ?Outcome: Progressing ?  ?

## 2022-03-18 NOTE — Progress Notes (Signed)
RT called to pt room about pt being in distress. RT took IC out, there was a small clot on the end of IC. IC changed and pt suctioned. Pt no longer in distress and states "that feel's better." RT placed pt back on ATC 28% for the time being for humidification.  ?

## 2022-03-19 LAB — GLUCOSE, CAPILLARY
Glucose-Capillary: 105 mg/dL — ABNORMAL HIGH (ref 70–99)
Glucose-Capillary: 110 mg/dL — ABNORMAL HIGH (ref 70–99)
Glucose-Capillary: 117 mg/dL — ABNORMAL HIGH (ref 70–99)
Glucose-Capillary: 125 mg/dL — ABNORMAL HIGH (ref 70–99)
Glucose-Capillary: 140 mg/dL — ABNORMAL HIGH (ref 70–99)
Glucose-Capillary: 142 mg/dL — ABNORMAL HIGH (ref 70–99)
Glucose-Capillary: 152 mg/dL — ABNORMAL HIGH (ref 70–99)

## 2022-03-19 NOTE — Progress Notes (Signed)
Patient is awake and alert this morning.  Somewhat distracted.  Denies any pain.  Asking about plans to go to rehabilitation. ? ?Remains afebrile.  Vital signs are stable.  Saturations are in the late 90s on room air. ? ?Tracheostomy is noted ?Diminished air entry at the bases but clear to auscultation ?S1-S2 is normal regular ?Abdomen is soft.  PEG tube is noted. ? ?CBGs are reasonably well controlled.  Will order labs for tomorrow. ? ?Patient noted to have mild hypercalcemia over the last 3 weeks.  Free water was increased as it was felt that she could be hypovolemic.  Nutritional supplement, Juven, was discontinued since it could be contributing to her hypercalcemia.  We will recheck her calcium level on Monday.  If it remains persistently elevated then may have to do further work-up. ? ?Her other medical issues are stable.  She was admitted with acute hemorrhagic stroke and respiratory failure.  Required tracheostomy and PEG tube placement.  Noted to have pulmonary embolism and currently on anticoagulation. ? ?Please review progress note from 03/17/22 by Junious Silk for details. ? ?We will continue to monitor. ? ?Sarah Myers ?03/19/2022 ? ?

## 2022-03-19 NOTE — Plan of Care (Signed)
?  Problem: Education: ?Goal: Knowledge of General Education information will improve ?Description: Including pain rating scale, medication(s)/side effects and non-pharmacologic comfort measures ?Outcome: Progressing ?  ?Problem: Health Behavior/Discharge Planning: ?Goal: Ability to manage health-related needs will improve ?Outcome: Progressing ?  ?Problem: Clinical Measurements: ?Goal: Ability to maintain clinical measurements within normal limits will improve ?Outcome: Progressing ?Goal: Will remain free from infection ?Outcome: Progressing ?Goal: Diagnostic test results will improve ?Outcome: Progressing ?Goal: Respiratory complications will improve ?Outcome: Progressing ?Goal: Cardiovascular complication will be avoided ?Outcome: Progressing ?  ?Problem: Activity: ?Goal: Risk for activity intolerance will decrease ?Outcome: Progressing ?  ?Problem: Nutrition: ?Goal: Adequate nutrition will be maintained ?Outcome: Progressing ?  ?Problem: Coping: ?Goal: Level of anxiety will decrease ?Outcome: Progressing ?  ?Problem: Elimination: ?Goal: Will not experience complications related to bowel motility ?Outcome: Progressing ?Goal: Will not experience complications related to urinary retention ?Outcome: Progressing ?  ?Problem: Pain Managment: ?Goal: General experience of comfort will improve ?Outcome: Progressing ?  ?Problem: Safety: ?Goal: Ability to remain free from injury will improve ?Outcome: Progressing ?  ?Problem: Skin Integrity: ?Goal: Risk for impaired skin integrity will decrease ?Outcome: Progressing ?  ?Problem: Education: ?Goal: Knowledge of disease or condition will improve ?Outcome: Progressing ?Goal: Knowledge of patient specific risk factors will improve (INDIVIDUALIZE FOR PATIENT) ?Outcome: Progressing ?  ?

## 2022-03-20 LAB — GLUCOSE, CAPILLARY
Glucose-Capillary: 129 mg/dL — ABNORMAL HIGH (ref 70–99)
Glucose-Capillary: 132 mg/dL — ABNORMAL HIGH (ref 70–99)
Glucose-Capillary: 101 mg/dL — ABNORMAL HIGH (ref 70–99)
Glucose-Capillary: 136 mg/dL — ABNORMAL HIGH (ref 70–99)
Glucose-Capillary: 119 mg/dL — ABNORMAL HIGH (ref 70–99)

## 2022-03-20 LAB — BASIC METABOLIC PANEL
Anion gap: 7 (ref 5–15)
BUN: 24 mg/dL — ABNORMAL HIGH (ref 6–20)
CO2: 26 mmol/L (ref 22–32)
Calcium: 10.9 mg/dL — ABNORMAL HIGH (ref 8.9–10.3)
Chloride: 104 mmol/L (ref 98–111)
Creatinine, Ser: 0.91 mg/dL (ref 0.44–1.00)
GFR, Estimated: >60 mL/min (ref >60)
Glucose, Bld: 134 mg/dL — ABNORMAL HIGH (ref 70–99)
Potassium: 4 mmol/L (ref 3.5–5.1)
Sodium: 137 mmol/L (ref 135–145)

## 2022-03-20 LAB — COMPREHENSIVE METABOLIC PANEL
ALT: 32 U/L (ref 0–44)
AST: 29 U/L (ref 15–41)
Albumin: 3.3 g/dL — ABNORMAL LOW (ref 3.5–5.0)
Alkaline Phosphatase: 69 U/L (ref 38–126)
Anion gap: 9 (ref 5–15)
BUN: 28 mg/dL — ABNORMAL HIGH (ref 6–20)
CO2: 21 mmol/L — ABNORMAL LOW (ref 22–32)
Calcium: 11 mg/dL — ABNORMAL HIGH (ref 8.9–10.3)
Chloride: 109 mmol/L (ref 98–111)
Creatinine, Ser: 1.01 mg/dL — ABNORMAL HIGH (ref 0.44–1.00)
GFR, Estimated: >60 mL/min (ref >60)
Glucose, Bld: 128 mg/dL — ABNORMAL HIGH (ref 70–99)
Potassium: 4.9 mmol/L (ref 3.5–5.1)
Sodium: 139 mmol/L (ref 135–145)
Total Bilirubin: 0.4 mg/dL (ref 0.3–1.2)
Total Protein: 7.1 g/dL (ref 6.5–8.1)

## 2022-03-20 MED ORDER — INSULIN ASPART 100 UNIT/ML IJ SOLN
0.0000 [IU] | INTRAMUSCULAR | 11 refills | Status: DC
Start: 2022-03-20 — End: 2022-04-26

## 2022-03-20 MED ORDER — JEVITY 1.5 CAL/FIBER PO LIQD
1000.0000 mL | ORAL | Status: DC
  Administered 2022-03-20 – 2022-03-21 (×2): 1000 mL
  Filled 2022-03-20 (×4): qty 1000

## 2022-03-20 MED ORDER — HYDRALAZINE HCL 20 MG/ML IJ SOLN: 20.0000 mg | Freq: Four times a day (QID) | INTRAMUSCULAR | Status: DC | PRN

## 2022-03-20 MED ORDER — POLYETHYLENE GLYCOL 3350 17 G PO PACK: 17.0000 g | PACK | Freq: Every day | ORAL | 0 refills | Status: DC | PRN

## 2022-03-20 MED ORDER — ACETAMINOPHEN 325 MG PO TABS: 650.0000 mg | ORAL_TABLET | ORAL | Status: DC | PRN

## 2022-03-20 MED ORDER — JEVITY 1.5 CAL/FIBER PO LIQD
1000.0000 mL | ORAL | Status: DC
Start: 2022-03-20 — End: 2022-04-26

## 2022-03-20 MED ORDER — PANTOPRAZOLE SODIUM 40 MG PO PACK: 40.0000 mg | PACK | Freq: Every day | ORAL | Status: DC

## 2022-03-20 MED ORDER — CLONIDINE HCL 0.1 MG PO TABS: 0.1000 mg | ORAL_TABLET | Freq: Three times a day (TID) | ORAL | 11 refills | Status: DC | PRN

## 2022-03-20 MED ORDER — SENNOSIDES-DOCUSATE SODIUM 8.6-50 MG PO TABS: 1.0000 | ORAL_TABLET | Freq: Two times a day (BID) | ORAL | Status: DC

## 2022-03-20 MED ORDER — PROSOURCE TF PO LIQD: 45.0000 mL | Freq: Two times a day (BID) | ORAL | Status: DC

## 2022-03-20 MED ORDER — AMLODIPINE BESYLATE 10 MG PO TABS: 10.0000 mg | ORAL_TABLET | Freq: Every day | ORAL | Status: DC

## 2022-03-20 MED ORDER — GUAIFENESIN 200 MG PO TABS: 200.0000 mg | ORAL_TABLET | Freq: Four times a day (QID) | ORAL | 0 refills | Status: DC

## 2022-03-20 MED ORDER — ADULT MULTIVITAMIN W/MINERALS CH: 1.0000 | ORAL_TABLET | Freq: Every day | ORAL | Status: DC

## 2022-03-20 MED ORDER — OXYCODONE HCL 5 MG PO TABS: 5.0000 mg | ORAL_TABLET | ORAL | 0 refills | Status: DC | PRN

## 2022-03-20 MED ORDER — BACITRACIN-NEOMYCIN-POLYMYXIN OINTMENT TUBE: 1.0000 "application " | TOPICAL_OINTMENT | Freq: Two times a day (BID) | CUTANEOUS | Status: DC

## 2022-03-20 MED ORDER — APIXABAN 5 MG PO TABS: 5.0000 mg | ORAL_TABLET | Freq: Two times a day (BID) | ORAL | Status: DC

## 2022-03-20 MED ORDER — CARVEDILOL 25 MG PO TABS: 25.0000 mg | ORAL_TABLET | Freq: Two times a day (BID) | ORAL | Status: DC

## 2022-03-20 MED ORDER — ALUM & MAG HYDROXIDE-SIMETH 200-200-20 MG/5ML PO SUSP: 15.0000 mL | Freq: Four times a day (QID) | ORAL | 0 refills | Status: DC | PRN

## 2022-03-20 MED ORDER — FREE WATER: 200.0000 mL | Status: DC

## 2022-03-20 MED ORDER — OLANZAPINE 2.5 MG PO TABS: 2.5000 mg | ORAL_TABLET | Freq: Every day | ORAL | Status: DC

## 2022-03-20 MED ORDER — AMANTADINE HCL 50 MG/5ML PO SOLN: 50.0000 mg | Freq: Two times a day (BID) | ORAL | Status: DC

## 2022-03-20 MED ORDER — CLONAZEPAM 0.5 MG PO TABS: 0.5000 mg | ORAL_TABLET | Freq: Every day | ORAL | 0 refills | Status: DC

## 2022-03-20 MED ORDER — METHOCARBAMOL 500 MG PO TABS: 500.0000 mg | ORAL_TABLET | Freq: Three times a day (TID) | ORAL | Status: DC

## 2022-03-20 NOTE — Progress Notes (Signed)
Physical Therapy Treatment ?Patient Details ?Name: Sarah Myers ?MRN: [id number] ?DOB: Apr 11, 1971 ?Today's Date: 03/20/2022 ? ? ?History of Present Illness Pt is 51 y.o. female [facility] with L hemiplegia and dysarthria. 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 Pt intubated 2/19. Attempted extubation 2/23 and 2/26, requiring reintubation same date. Tracheostomy 02/14/2022. PMH includes uncontrolled HTN ? ?  ?PT Comments  ? ? Pt making some excellent progress today with transfers.  She was able to progress from mod A of 2 for supine to/from sit to mod A of 1 with cues.  Also, progressed to min A to stand with R UE supported on bed rail.  Not able to progress gait with assist of 1.  Pt able to elicit some muscle contractions with heel slides on L.  L UE remains flaccid and painful.  Pt expressing determination to walk again on her own without AD- some decreased insight into deficits; however, pt does have good potential to progress transfers to a much more independent level.  Pt also limited by vision - poor in all fields. Goals were met and updated.  Continue plan of care. ?  ?Recommendations for follow up therapy are one component of a multi-disciplinary discharge planning process, led by the attending physician.  Recommendations may be updated based on patient status, additional functional criteria and insurance authorization. ? ?Follow Up Recommendations ? Skilled nursing-short term rehab (<3 hours/day) ?  ?  ?Assistance Recommended at Discharge Frequent or constant Supervision/Assistance  ?Patient can return home with the following Two people to help with walking and/or transfers;Two people to help with bathing/dressing/bathroom;Help with stairs or ramp for entrance;Assistance with cooking/housework;Direct supervision/assist for financial management ?  ?Equipment Recommendations ? Wheelchair (measurements PT);Wheelchair cushion (measurements  PT);Hospital bed  ?  ?Recommendations for Other Services   ? ? ?  ?Precautions / Restrictions Precautions ?Precautions: Fall ?Precaution Comments: L inattention, PEG, trach  ?  ? ?Mobility ? Bed Mobility ?Overal bed mobility: Needs Assistance ?Bed Mobility: Supine to Sit, Sit to Supine ?  ?  ?Supine to sit: Mod assist ?Sit to supine: Mod assist ?  ?General bed mobility comments: Able to perform sit to/from supine today with assist of 1 (did require assist of 2 to scoot up in bed.)  Required cues for sequencing with assist for L LE and to lift trunk.  Pt was able to assist with scooting with cues ?  ? ?Transfers ?Overall transfer level: Needs assistance ?Equipment used: 1 person hand held assist ?Transfers: Sit to/from Stand ?Sit to Stand: Min assist ?  ?  ?  ?  ?  ?General transfer comment: Performed 3 sit to stands from EOB.  Pt holding onto bed rail with R hand and therapist supporting L UE.  Pt stood 3 x with min A and cues. She was able to pivot R LE toward [facility] with cues but not move L LE with assist of 1. ?  ? ?Ambulation/Gait ?  ?  ?  ?  ?  ?  ?  ?General Gait Details: unable with assist of 1 ? ? ?Stairs ?  ?  ?  ?  ?  ? ? ?Wheelchair Mobility ?  ? ?Modified Rankin (Stroke Patients Only) ?Modified Rankin (Stroke Patients Only) ?Pre-Morbid Rankin Score: No symptoms ?Modified Rankin: Severe disability ? ? ?  ?Balance Overall balance assessment: Needs assistance ?Sitting-balance support: No upper extremity supported ?Sitting balance-Leahy Scale: Fair ?Sitting balance - Comments: Pt sat  EOB for at least 15 mins during session.  Requiring min guard-min A initially but progressing to close supervision-min guard with cues. ?  ?Standing balance support: Bilateral upper extremity supported ?Standing balance-Leahy Scale: Poor ?Standing balance comment: Stood x 3 from bed with R hand on bed rail and therapist supporting L UE.  Pt standing for 10-15 seconds - fatigues eaisly and tends to lock L LE/support back of knee on  bed ?  ?  ?  ?  ?  ?  ?  ?  ?  ?  ?  ?  ? ?  ?Cognition Arousal/Alertness: Awake/alert ?Behavior During Therapy: [facility] for tasks assessed/performed ?Overall Cognitive Status: Impaired/Different from baseline ?  ?  ?  ?  ?  ?  ?  ?  ?  ?Orientation Level: Disoriented to, Time ?Current Attention Level: Sustained ?  ?Following Commands: Follows one step commands consistently, Follows multi-step commands inconsistently ?Safety/Judgement: Decreased awareness of deficits ?  ?Problem Solving: Difficulty sequencing, Requires verbal cues, Requires tactile cues ?General Comments: Pt with decreased insight into deficits and recovery.  She did repeat self about L side being weak and "the side I'm worried about," somewhat distracted about kids not coming to visit ?  ?  ? ?  ?Exercises Total Joint Exercises ?Ankle Circles/Pumps: PROM, Left, 15 reps, Supine (with heel cord stretch) ?Heel Slides: AAROM, Left, 15 reps, Supine (max verbal cues to assist, focus, had pt push on eccentric phase; some good contractions but fatigued easily) ? ?  ?General Comments General comments (skin integrity, edema, etc.): VSS on RA with PMV on trach; Vision is very poor all directions (difficulty finding Mother in room, identifying objects on R , L , or in front) ?  ?  ? ?Pertinent Vitals/Pain Pain Assessment ?Pain Assessment: Faces ?Faces Pain Scale: Hurts a little bit ?Pain Location: L shoulder ?Pain Descriptors / Indicators: Tender ?Pain Intervention(s): Limited activity within patient's tolerance, Monitored during session, Repositioned  ? ? ?Home Living   ?  ?  ?  ?  ?  ?  ?  ?  ?  ?   ?  ?Prior Function    ?  ?  ?   ? ?PT Goals (current goals can now be found in the care plan section) Acute Rehab PT Goals ?Patient Stated Goal: I want to walk out to the hall. ?PT Goal Formulation: With patient ?Time For Goal Achievement: 04/03/22 ?Potential to Achieve Goals: Good ?Progress towards PT goals: Goals met and updated - see care plan ? ?   ?Frequency ? ? ? Min 3X/week ? ? ? ?  ?PT Plan Current plan remains appropriate  ? ? ?Co-evaluation   ?  ?  ?  ?  ? ?  ?AM-PAC PT "6 Clicks" Mobility   ?Outcome Measure ? Help needed turning from your back to your side while in a flat bed without using bedrails?: A Lot ?Help needed moving from lying on your back to sitting on the side of a flat bed without using bedrails?: A Lot ?Help needed moving to and from a bed to a chair (including a wheelchair)?: A Lot ?Help needed standing up from a chair using your arms (e.g., wheelchair or bedside chair)?: A Lot ?Help needed to walk in hospital room?: Total ?Help needed climbing 3-5 steps with a railing? : Total ?6 Click Score: 10 ? ?  ?End of Session Equipment Utilized During Treatment: Gait belt ?Activity Tolerance: Patient tolerated treatment well ?Patient left: in bed;with call bell/phone  within reach;with bed alarm set;with family/visitor present ?Nurse Communication: Mobility status ?PT Visit Diagnosis: Other abnormalities of gait and mobility (R26.89);Hemiplegia and hemiparesis ?Hemiplegia - Right/Left: Left ?Hemiplegia - dominant/non-dominant: Dominant ?Hemiplegia - caused by: Nontraumatic intracerebral hemorrhage ?  ? ? ?Time: 4949-4473 ?PT Time Calculation (min) (ACUTE ONLY): 30 min ? ?Charges:  $Therapeutic Activity: 8-22 mins ?$Neuromuscular Re-education: 8-22 mins          ?          ? ?Abran Richard, PT ?Acute Rehab Services ?Pager [phone] ?[facility] [phone] ? ? ? ?Mikael Spray Amal Renbarger ?03/20/2022, 5:52 PM ? ?

## 2022-03-20 NOTE — Progress Notes (Signed)
Progress Note ?  ?  ?Patient: Sarah Myers [id number] DOB: 1971/08/12 DOA: 02/05/2022     39 ?DOS: the patient was seen and examined on 03/16/2022 ?  ?Brief hospital course: ?51 year old with history of hypertension presented to the emergency room with left-sided weakness and slurred speech.  She was found to have dense left hemiplegia and intracranial hemorrhage. ?2/19, admitted to intensive care unit with left-sided dense hemiplegia and slurred speech and found to have right intracranial hemorrhage with brain compression present on admission.  Treated with 3% saline, Cleviprex and intubated.  She failed intubation x2 and underwent tracheostomy on 2/28 by PCCM. ?2/28, tracheostomy. ?3/1 trach collar, off ventilator on 3/4. ?Transferred to medical floor on 3/6.  Cuffless trach on 3/16.  PEG tube placed on 3/20. ?Hospital course complicated by MSSA pneumonia, completed antibiotic therapy.   ?  ?  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  Follow-up CT revealed stability of hematoma and subsequent CT revealed progression of right ICA  ?Continue Klonopin, Symmetrel and Zyprexa for brain injury sequela; continue Robaxin for hypertonicity ?SLP evaluated patient and recommended dysphagia 2 diet with thin liquids under supervision.  Recommends PMSV as tolerated ?  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  ?Tracheostomy dependent (Howe) ?2/28, tracheostomy ?3/16, downsized to cuffless #4 trach.  Currently on trach collar with minimum oxygen. ?PCCM recommends continuing guaifenesin as mucolytic and monitoring closely for further mucous plugging ?  ?Dysphagia due to recent stroke/moderate PCM ?PEG tube placed by IR on 3/20 ?Continue tube feeding but given lack of appetite during the day  we will change tube feeding to nocturnal on a 12-hour schedule ?Continue Eliquis  ?Continue D2 diet with thin liquids-hopeful with change to nocturnal tube feedings appetite will improve ?  ?Mild hypercalcemia ?Calcium has been steadily trending upward over the past several weeks with current reading 11.1 with calcium as high as 12 in the past month ?Likely related to volume depletion from insensible fluid loss from baseline trach ?Down to 10.9 after free water frequency increased and Juven dc'd ?Follow Labs-rpt BMET 4/4 ?  ?Bilateral pulmonary embolism (HCC) ?Detected on CT angiogram 3/7.  ?Cleared for anticoagulation by neurology/Dr.Xu on 3/8  ?Patient was initially on heparin, then transitioned to Eliquis.   ?  ?Malignant hypertension ?Presented with hypertensive emergency ?BP well controlled on Norvasc, carvedilol, Cozaar and Catapres ?Continue prn IV hydralazine  ?  ?Oral thrush ?Resolved ?  ?Iron deficiency anemia ?Current hemoglobin stable at 11.5 ?Consider regular per tube iron replacement ?  ?Pressure injury of skin ?       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 Subcutaneous fat may be visible but bone, tendon or muscle are NOT exposed. (Active)  ?Date First Assessed/Time First Assessed: 02/26/22 0443   Location: Cervical  Location Orientation: Left  Staging: Stage 3 -  Full thickness tissue loss. Subcutaneous fat may be visible but bone, tendon or muscle are NOT exposed.  Present on Admission: No  ?   ?Assessments 02/26/2022  4:44 AM 03/09/2022  8:00 AM  ?Dressing Type -- Foam - Lift dressing to assess site every shift  ?Dressing Clean, Dry, Intact Reinforced  ?Dressing Change Frequency -- Every 3 days  ?Peri-wound Assessment Erythema (blanchable) --  ?Margins Unattached edges (unapproximated) Unattached  edges (unapproximated)  ?Drainage Amount None --  ?   ?No Linked orders to display  ?  ?  ?Counseling regarding goals of care ?High risk of rehemorrhage on  anticoagulation.   ?Palliative care, family meeting 3/16.  She is very high risk of decompensation, she is very high risk of getting another bleeding.  Family requested full scope of treatment ?3/21 patient's daughter [name] at the bedside.  Given updates about brain hemorrhage, massive pulmonary embolism on blood thinners and challenging treatment options previous attending physician.  All questions were answered.  Since patient becomes very uncomfortable at night, we decided to keep on current doses of medications including Seroquel and pain medications. ?  ?  ?Subjective:  ?Awake and alert.  Asking appropriate questions regarding transition to LTAC.  Stated several times that she could not believe she had a stroke.  Became emotional over her current neurological deficits.  Noted to be asking some of the same questions over and over which is consistent with short-term memory deficit. ?  ?Physical Exam: ?           ?Vitals:  ?  03/16/22 0327 03/16/22 [id number] 03/16/22 [id number] 03/16/22 0815  ?BP: (!) 169/119        ?Pulse: (!) 105 (!) 101 100    ?Resp: 20 20 20     ?Temp: 98.7 ?F (37.1 ?C)     99.4 ?F (37.4 ?C)  ?TempSrc: Oral     Oral  ?SpO2: 100% 100% 99%    ?Weight:          ?Height:          ?  ?Constitutional: Awake and alert ?Respiratory: Lung sounds are clear to auscultation, 4.0 cuffless to trach, noted PMSV in place and able to phonate well, stable on 5 L/21% O2-nonlabored respiratory effort ?Cardiovascular: Regular rate and rhythm, No extremity edema.  Normotensive ?Abdomen: + tenderness when palpating directly over PEG site, Bowel sounds positive.  LBM 4/3 ?Musculoskeletal: no clubbing / cyanosis.  Generalized hypertonic muscle tone has significantly improved ?Neurologic: CN 2-12 appears to be grossly intact. Sensation appears to be grossly intact especially on the right.  Quickly follow simple commands with right upper extremity.  Unable to move right lower extremity as well.  Noted with apparent hyperesthesias  on left upper extremity no movement left lower extremity.  Appears to have a left hemianopsia ?Psychiatric: Awake and alert.  Oriented to name and place.  Not to year. ?  ?Data Reviewed: ?All current lab results reviewed ?  ?Family Communication:  ?4/3 family updated by case management regarding potential discharge plan ?  ?Disposition: ?Remains inpatient appropriate because:  ?Requires skilled nursing level of care due to tracheostomy tube, unresponsive state and ongoing need for tube feedings.  She is appropriate for SNF/LTAC ?  ?Planned Discharge Destination:  ?Skilled nursing facility ?  ?Medically stable ?Yes ?  ?COVID vaccination status:  ?unknown ?  ?Consultants: ?Neurosurgery ?PCCM ?Palliative medicine ?Internal radiology ?Procedures: ?Intubation ?Echocardiogram ?Follow-up echocardiogram with bubble study ?Cortrack tube ?Bedside tracheostomy tube placement ?PEG tube placement ?Antibiotics: ?IV vancomycin x1 dose on 2/23 ?Cefepime 2/23 through 3/1 ?Azithromycin 3/7 ?Cefepime 3/7, 3/8 ?Vancomycin 3/7 ?Cefazolin 3/8 through 3/16 ?Fluconazole 3/19 ?Cefazolin 3/20 ?  ?  ?  ?  ?Time spent: 20 minutes ?  ?Author: ?Erin Hearing, NP ?03/16/2022 10:48 AM ?

## 2022-03-20 NOTE — Plan of Care (Signed)
?  Problem: Safety: ?Goal: Ability to remain free from injury will improve ?Outcome: Progressing ?  ?Problem: Education: ?Goal: Knowledge of disease or condition will improve ?Outcome: Progressing ?Goal: Knowledge of patient specific risk factors will improve (INDIVIDUALIZE FOR PATIENT) ?Outcome: Progressing ?  ?

## 2022-03-20 NOTE — Progress Notes (Addendum)
? ?NAME:  Sarah Myers, MRN:  [id number], DOB:  10/22/1971, LOS: 43 ?ADMISSION DATE:  02/05/2022, CONSULTATION DATE:  2/19 ?REFERRING MD:  Selina Cooley - neuro, CHIEF COMPLAINT:  Left sided weakness, slurred speech  ? ?History of Present Illness:  ?51 y/o female admitted to [facility] on 2/19 with left sided weakness and slurred speech.  Work up consistent with  a right ICH with "brain compression" present on admission.  Required intubation for airway protection, 3% saline, & cleviprex.  Hospital course complicated by prolonged respiratory failure with failed extubations due to stridor requiring tracheostomy (2/28).  ? ?Pertinent  Medical History  ?Hypertension ? ?Significant Hospital Events: ?Including procedures, antibiotic start and stop dates in addition to other pertinent events   ?2/19 admitted to neuro with acute R ICH. Intubated for airway protection. Started on 3% and  cleviprex. PCCM consult. NSGY consult  ?02/06/2022 For repeat CT Head >> Overnight CT showed increase in size of bleed to 36 ml , but unchanged midline shift at 4 mm . Repeating CT head again 2/20 am. Neurosurgery is aware ?2/21 3% saline stopped due to Na 158. weaned off cleviprex. Remains intubated. ?2/22 neuro exam much improved (moves RUE and RLE to command; thumbs up; blinks eyes to command; sticks tongue out); sinus pause overnight w/ inferiolateral ST elevation overnight; troponin 387 then 269 ?2/23 failed extubation due to stridor. Reintubated ?2/24 Diuresis ?2/26 failed 2nd attempt at extubation. CT head ICH without rebleeding. Hematoma and edema causes 4 mm of midline shift. 2. Bilateral cribriform plate cephalocele. ?2/28 tracheostomy by Kendrick Fries, stopped versed infusion ?3/1 trach collar x1 hour ?3/2 worked with PT ?3/5 Off vent 24 hours  ?3/10 Mild hemoptysis from trach on heparin infusion ?03/01/2022 no overt hemoptysis noted remains on heparin infusion ?03/01/2022 changed to cuffless trach ?3/26 trach displaced and she desatted to 78%, 4  cuff less shiley reinserted by RT ?4/3 Awake and alert, PM valve in and speaking appropriately. Tolerating Dysphagia 2 Diet.  ? ?Interim History / Subjective:  ? ?Awake and alert. # 4 cuffless trach is in place. Currently using he PM valve during waking hours, off at HS. Phonation is strong. Cough is strong. Continued thick copious secretions.  ? ?Objective   ?Blood pressure 135/89, pulse 70, temperature 98.6 ?F (37 ?C), temperature source Oral, resp. rate 18, height 5\' 4"  (1.626 m), weight 72.6 kg, SpO2 100 %. ?   ?FiO2 (%):  [21 %] 21 %  ? ?Intake/Output Summary (Last 24 hours) at 03/20/2022 0925 ?Last data filed at 03/20/2022 05/20/2022 ?Gross per 24 hour  ?Intake 160 ml  ?Output --  ?Net 160 ml  ? ? ?Filed Weights  ? 03/12/22 0500 03/18/22 0517 03/19/22 0600  ?Weight: 68.3 kg 70.7 kg 72.6 kg  ? ? ?Examination: ?General appearance: 51 y.o., female, nad - after suctioning ?HENT: NCAT; MMM ?Neck: 4 cuffed shiley flex in place ?Lungs: CTAB, with normal respiratory effort ?CV: RRR, no murmur  ?Abdomen: Soft, non-tender; non-distended, BS present  ?Extremities: No peripheral edema, warm ? ?Resolved Hospital Problem list   ?MSSA/Serratia pneumonia, fever on 2/28 due to pneumonia  ?AKI   ?Iatrogenic hypernatremia from hypertonic saline given for brain swelling  ?Agitated Delirium  ?Hypertensive Emergency  ?Acute Cardiogenic Pulmonary Edema  ?Stridor due to upper airway edema, failed extubation x 2 ?Hemoptysis  ? ?Assessment & Plan:  ?  ?Acute Hypoxemic Respiratory Failure s/p Tracheostomy  ?Pulmonary Embolism  ?Trach placed 2/28 per B. McQuaid #6 cuffed, change to 4 cuffless shiley 3/16  and replaced 3/26  ?Continue routine trach care ?Maintain sats > 92% ?Monitor closely for further mucous plugging ?Continue Guaifenesin 200 mg Q 6 hours as mucolytic ? ?Right temporal hematoma with brain compression   ?Acute Encephalopathy in setting of Stroke  ?Per [facility] and neurology ? ? ?Best Practice (right click and "Reselect all SmartList  Selections" daily)  ?Per primary ? ?PCCM will continue to follow weekly. Please call sooner if new needs arise.  ?Once secretions are less, she may be ready to decannualte ? ?Critical care time: n/a minutes  ? ?Bevelyn Ngo, MSN, AGACNP-BC ?[facility] ?See Amion for personal pager ?PCCM on call pager [phone]  ?If no response to pager , please call [phone] until 7pm ?After 7:00 pm call Elink  [phone] ?03/20/2022, 9:25 AM  ? ? ? ? ? ? ? ? ? ? ? ?

## 2022-03-20 NOTE — TOC Progression Note (Signed)
Transition of Care (TOC) - Progression Note  ? ? ?Patient Details  ?Name: Sarah Myers ?MRN: [id number] ?Date of Birth: [birth year] ? ?Transition of Care (TOC) CM/SW Contact  ?Curlene Labrum, RN ?Phone Number: ?03/20/2022, 10:56 AM ? ?Clinical Narrative:    ?CM met with the patient at the bedside today to discuss discharging to [facility] for LTAC bed.  The patient has been approved by insurance for admission and is currently waiting on bed to open - likely tomorrow, 03/21/2022.  The patient was updated by bedside nursing.  RNCM and MSW with DTP Team will continue to follow the patient for LTAC admission. ? ? ?Expected Discharge Plan: [place] (LTAC) ?Barriers to Discharge: Continued Medical Work up ? ?Expected Discharge Plan and Services ?Expected Discharge Plan: [place] (LTAC) ?In-house Referral: Clinical Social Work, Development worker, community ?Discharge Planning Services: CM Consult ?Post Acute Care Choice: [place] ?Living arrangements for the past 2 months: Apartment ?                ?  ?  ?  ?  ?  ?  ?  ?  ?  ?  ? ? ?Social Determinants of Health (SDOH) Interventions ?  ? ?Readmission Risk Interventions ? ?  03/09/2022  ?  9:54 AM  ?Readmission Risk Prevention Plan  ?Transportation Screening Complete  ?PCP or Specialist Appt within 3-5 Days Complete  ?[place] or Home Care Consult Not Complete  ?Social Work Consult for [place] Planning/Counseling Complete  ?Medication Review Press photographer) Complete  ? ? ?

## 2022-03-21 DIAGNOSIS — I619 Nontraumatic intracerebral hemorrhage, unspecified: Secondary | ICD-10-CM | POA: Diagnosis not present

## 2022-03-21 LAB — CBC
HCT: 36.4 % (ref 36.0–46.0)
Hemoglobin: 11.7 g/dL — ABNORMAL LOW (ref 12.0–15.0)
MCH: 29.8 pg (ref 26.0–34.0)
MCHC: 32.1 g/dL (ref 30.0–36.0)
MCV: 92.6 fL (ref 80.0–100.0)
Platelets: 139 10*3/uL — ABNORMAL LOW (ref 150–400)
RBC: 3.93 MIL/uL (ref 3.87–5.11)
RDW: 14.6 % (ref 11.5–15.5)
WBC: 6.8 10*3/uL (ref 4.0–10.5)
nRBC: 0 % (ref 0.0–0.2)

## 2022-03-21 LAB — GLUCOSE, CAPILLARY
Glucose-Capillary: 100 mg/dL — ABNORMAL HIGH (ref 70–99)
Glucose-Capillary: 113 mg/dL — ABNORMAL HIGH (ref 70–99)
Glucose-Capillary: 126 mg/dL — ABNORMAL HIGH (ref 70–99)
Glucose-Capillary: 133 mg/dL — ABNORMAL HIGH (ref 70–99)
Glucose-Capillary: 139 mg/dL — ABNORMAL HIGH (ref 70–99)
Glucose-Capillary: 150 mg/dL — ABNORMAL HIGH (ref 70–99)

## 2022-03-21 LAB — BASIC METABOLIC PANEL
Anion gap: 11 (ref 5–15)
BUN: 22 mg/dL — ABNORMAL HIGH (ref 6–20)
CO2: 20 mmol/L — ABNORMAL LOW (ref 22–32)
Calcium: 10.3 mg/dL (ref 8.9–10.3)
Chloride: 107 mmol/L (ref 98–111)
Creatinine, Ser: 0.93 mg/dL (ref 0.44–1.00)
GFR, Estimated: >60 mL/min (ref >60)
Glucose, Bld: 151 mg/dL — ABNORMAL HIGH (ref 70–99)
Potassium: 3.7 mmol/L (ref 3.5–5.1)
Sodium: 138 mmol/L (ref 135–145)

## 2022-03-21 MED ORDER — GABAPENTIN 100 MG PO CAPS
ORAL_CAPSULE | ORAL | Status: DC
Start: 2022-03-21 — End: 2022-04-26

## 2022-03-21 MED ORDER — GABAPENTIN 100 MG PO CAPS
100.0000 mg | ORAL_CAPSULE | Freq: Two times a day (BID) | ORAL | Status: DC
  Administered 2022-03-21 (×2): 100 mg via ORAL
  Filled 2022-03-21 (×2): qty 1

## 2022-03-21 NOTE — TOC Progression Note (Signed)
Transition of Care (TOC) - Progression Note  ? ? ?Patient Details  ?Name: Sarah Myers ?MRN: [id number] ?Date of Birth: 10/17/1971 ? ?Transition of Care (TOC) CM/SW Contact  ?Janae Bridgeman, RN ?Phone Number: ?03/21/2022, 8:01 AM ? ?Clinical Narrative:    ?CM spoke with Melburn Popper, daughter, on the phone this morning and updated her that the patient will have an available admission bed at [facility] to transition to this afternoon.  I asked that the patient's daughter follow up with [employer] to pay for April 2023 payment for the insurance coverage and forward the paid bill information so that [facility] will have a copy of the document.  The daughter will follow up with [employer] Insurance provider this morning and will follow up.  I called and left a message with Cassell Clement, CM with [facility]. ? ?CM and MSW with DTP Team will continue to follow the patient for discharge to LTAC today, Rennis Harding NP is aware. ? ? ?Expected Discharge Plan: Long Term Acute Care (LTAC) ?Barriers to Discharge: Continued Medical Work up ? ?Expected Discharge Plan and Services ?Expected Discharge Plan: Long Term Acute Care (LTAC) ?In-house Referral: Clinical Social Work, Artist ?Discharge Planning Services: CM Consult ?Post Acute Care Choice: Skilled Nursing Facility ?Living arrangements for the past 2 months: Apartment ?                ?  ?  ?  ?  ?  ?  ?  ?  ?  ?  ? ? ?Social Determinants of Health (SDOH) Interventions ?  ? ?Readmission Risk Interventions ? ?  03/09/2022  ?  9:54 AM  ?Readmission Risk Prevention Plan  ?Transportation Screening Complete  ?PCP or Specialist Appt within 3-5 Days Complete  ?HRI or Home Care Consult Not Complete  ?Social Work Consult for Recovery Care Planning/Counseling Complete  ?Medication Review Oceanographer) Complete  ? ? ?

## 2022-03-21 NOTE — Progress Notes (Signed)
Pt stated she could not breath and was SOB with capped trach. Cap was taken off and is on RA. Pt is currently stable,sats @ 99%. RT will continue to monitor ? ?

## 2022-03-21 NOTE — Progress Notes (Signed)
Case management called and spoke with Haylee O. At [employer] ID: [id number] - phone - [phone] and confirmed that the patient, Sarah Myers, DOB Apr 23, 1971 has full healthcare coverage through 12/17/2022 - Issue#[id number].  She is actively insured through the provider from January 2023 - December 2023.   ? ?CM called and spoke with Cassell Clement, CM with [facility] and this confirmation was emailed to her for admission records/documentation. ?

## 2022-03-21 NOTE — Progress Notes (Signed)
Speech Language Pathology Treatment: Dysphagia;Cognitive-Linquistic  ?Patient Details ?Name: Sarah Myers ?MRN: [id number] ?DOB: 12-29-1970 ?Today's Date: 03/21/2022 ?Time: 1324-4010 ?SLP Time Calculation (min) (ACUTE ONLY): 29 min ? ?Assessment / Plan / Recommendation ?Clinical Impression ? Pt seen for cognitive, communication and cognitive therapy with daughter at bedside. Pt's PMV donned on therapist arrival and vocal intensity is normal with a mild hypernasal resonance and minimally decreased work of breathing in conversation. She needed moderate verbal cues to slow her rate and over articulate to increase intelligibility. All vitals were within normal limits. RT arrived during session to cap her trach which she was able to adequately coordinate her respiration and phonation.  ? ?Pt needed total assist due to visual deficits to scoop food but able to bring utensil to mouth independently. Mastication was intentionally prolonged with pt stating she wanted to take her time however small amount consumed. She has decreased sensation and thought she had food in oral cavity when she did not. Needed encouragement to proceed with subsequent bites and agreed but somewhat hesitant. One cough after potatoes was noted. No s/sx aspiration with liquids.  ? ?Pt stated month was September but able to state April with cues. She exhibits good awareness of her stroke and physical impairments but needs assist to increase awareness of dysarthria and cognition. 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 Making excellent progress towards goals. Pt is set to go to [facility] today   ?HPI HPI: 51 y/o female came to the [facility] ER on 2/19 with left sided weakness and slurred speech.  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 Intubated 2/19. Trach changed from #6 cuffed to #4 cuffless on 3/16. Palliative NP met with patient's three daughters and  decision was reached that they would like to see how PO trials go and if not tolerated they are open to PEG; Pt mostly lethargic and asleep until NP adjusted meds and she is now alert, conversive and able to sustain alertness. ?  ?   ?SLP Plan ? Continue with current plan of care ? ?  ?  ?Recommendations for follow up therapy are one component of a multi-disciplinary discharge planning process, led by the attending physician.  Recommendations may be updated based on patient status, additional functional criteria and insurance authorization. ?  ? ?Recommendations  ?Diet recommendations: Dysphagia 2 (fine chop);Thin liquid ?Liquids provided via: Straw;Cup ?Medication Administration: Whole meds with puree ?Supervision: Staff to assist with self feeding;Full supervision/cueing for compensatory strategies ?Compensations: Slow rate;Small sips/bites;Lingual sweep for clearance of pocketing ?Postural Changes and/or Swallow Maneuvers: Seated upright 90 degrees  ?   ? Patient may use Passy-Muir Speech Valve:  (now has capped trach)  ?   ? ? ? ? Oral Care Recommendations: Oral care BID ?Follow Up Recommendations: SLP at Long-term acute care hospital ?Assistance recommended at discharge: Frequent or constant Supervision/Assistance ?SLP Visit Diagnosis: Dysphagia, unspecified (R13.10);Cognitive communication deficit (R41.841) ?Plan: Continue with current plan of care ? ? ? ? ?  ?  ? ? ?[name] ? ?03/21/2022, 10:14 AM ?

## 2022-03-21 NOTE — Discharge Summary (Addendum)
Physician Discharge Summary  ?Sarah Myers [id number] DOB: [birth year] DOA: 02/05/2022 ? ?PCP: [name], MD ? ?Admit date: 02/05/2022 ?Discharge date: 03/21/2022 ? ?Time spent: 45 minutes ? ?Recommendations for Outpatient Follow-up:  ?Patient will discharge to [place] ?Once discharged back to the home environment she will need to follow-up with [facility] for post stroke care.  The contact number is [phone] ?Based on the trach team/pulmonary note from 4/3 plan is to begin capping trials since patient now has minimal secretions on scheduled mucolytic's. ?Patient has been having issues with mild hypercalcemia with peak calcium 12.2.  Juven was discontinued and free water was increased./For calcium down to 10.3. ?Patient was having difficulty initiating oral feeds secondary to lack of appetite.  On 4/3 tube feedings changed to nocturnal over 12 hrs. ?Platelets have decreased to 139,000 as of 4/4.  Recommend repeating CBC ? ? ?Discharge Diagnoses:  ?Principal Problem: ?  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  Tracheostomy dependent ([place]) ?  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  Bilateral pulmonary embolism (HCC) ?  Malignant hypertension ?  Oral thrush ?  Iron deficiency anemia ?  Pressure injury of skin ?  Counseling regarding goals of care ?  AKI (acute kidney injury) ([place]) ?  Hypercalcemia ? ? ? ?Discharge Condition: Stable ? ?Diet recommendation: Dysphagia 2 with thin liquids; nocturnal tube feedings with Jevity 1.5 at 55 cc/h over 12 hours ? ?Filed Weights  ? 03/18/22 0517 03/19/22 0600 03/21/22 0500  ?Weight: 70.7 kg 72.6 kg 74.1 kg  ? ? ?History of present illness:  ?51 year old with history of hypertension presented to the emergency room with left-sided weakness and slurred speech.  She was found to have dense left hemiplegia and intracranial  hemorrhage. ?2/19, admitted to intensive care unit with left-sided dense hemiplegia and slurred speech and found to have right intracranial hemorrhage with brain compression present on admission.  Treated with 3% saline, Cleviprex and intubated.  She failed intubation x2 and underwent tracheostomy on 2/28 by PCCM. ?2/28, tracheostomy. ?3/1 trach collar, off ventilator on 3/4. ?Transferred to medical floor on 3/6.  Cuffless trach on 3/16.  PEG tube placed on 3/20. ?Hospital course complicated by MSSA pneumonia, completed antibiotic therapy.   ?  ? 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  Follow-up CT revealed stability of hematoma and subsequent CT revealed progression of right ICA  ?Continue Klonopin, Symmetrel and Zyprexa for brain injury sequela; continue Robaxin for hypertonicity ?SLP evaluated patient and recommended dysphagia 2 diet with thin liquids under supervision.  Recommends PMSV as tolerated ?Date of discharge patient began having increased pain, a sensation of swelling as well as hyperesthesias involving the LUE which is on the stroke affected side.  Low-dose Neurontin was initiated. ?  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  ?Tracheostomy dependent ([place]) ?2/28, tracheostomy ?3/16, downsized to cuffless #4 trach.  Currently on trach collar with minimum oxygen. ?PCCM recommends continuing guaifenesin as mucolytic and monitoring closely for further mucous plugging ?  ?Dysphagia due to recent stroke/moderate PCM ?PEG tube placed by IR on 3/20 ?Continue tube feeding but given lack of appetite during the day we will change tube feeding to nocturnal on a 12-hour schedule ?Continue  Eliquis  ?Continue D2 diet with thin liquids-hopeful with change to nocturnal tube feedings  appetite will improve ?  ?Mild hypercalcemia ?Calcium has been steadily trending upward over the past several weeks with current reading 11.1 with calcium as high as 12 in the past month ?Likely related to volume depletion from insensible fluid loss from baseline trach ?Down to 10.9 after free water frequency increased and Juven dc'd ?Follow Labs-rpt BMET 4/4 ?  ?Bilateral pulmonary embolism (HCC) ?Detected on CT angiogram 3/7.  ?Cleared for anticoagulation by neurology/Dr.Xu on 3/8  ?Patient was initially on heparin, then transitioned to Eliquis.   ?  ?Malignant hypertension ?Presented with hypertensive emergency ?BP well controlled on Norvasc, carvedilol, Cozaar and Catapres ?Continue prn IV hydralazine  ?  ?Oral thrush ?Resolved ?  ?Iron deficiency anemia ?Current hemoglobin stable at 11.5 ?Consider regular per tube iron replacement ?  ?Pressure injury of skin ?       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 Subcutaneous fat may be visible but bone, tendon or muscle are NOT exposed. (Active)  ?Date First Assessed/Time First Assessed: 02/26/22 0443   Location: Cervical  Location Orientation: Left  Staging: Stage 3 -  Full thickness tissue loss. Subcutaneous fat may be visible but bone, tendon or muscle are NOT exposed.  Present on Admission: No  ?   ?Assessments 02/26/2022  4:44 AM 03/09/2022  8:00 AM  ?Dressing Type -- Foam - Lift dressing to assess site every shift  ?Dressing Clean, Dry, Intact Reinforced  ?Dressing Change Frequency -- Every 3 days  ?Peri-wound Assessment Erythema (blanchable) --  ?Margins Unattached edges (unapproximated) Unattached edges (unapproximated)  ?Drainage Amount None --  ?   ?No Linked orders to display  ?  ?  ?Counseling regarding goals of care ?High risk of rehemorrhage on anticoagulation.   ?Palliative care, family meeting 3/16.  She is very high risk of decompensation, she is very high risk of getting another bleeding.  Family requested full  scope of treatment ?3/21 patient's daughter [name] at the bedside.  Given updates about brain hemorrhage, massive pulmonary embolism on blood thinners and challenging treatment options previous attending physician.  All questions were answered.  Since patient becomes very uncomfortable at night, we decided to keep on current doses of medications including Seroquel and pain medications. ?  ? ? ?Procedures: ?Intubation ?Echocardiogram ?Follow-up echocardiogram with bubble study ?Cortrack tube ?Bedside tracheostomy tube placement ?PEG tube placement  ? ?Consultations: ?Neurosurgery ?PCCM ?Palliative medicine ?Interventional radiology ? ?Antibiotics: ?IV vancomycin x1 dose on 2/23 ?Cefepime 2/23 through 3/1 ?Azithromycin 3/7 ?Cefepime 3/7, 3/8 ?Vancomycin 3/7 ?Cefazolin 3/8 through 3/16 ?Fluconazole 3/19 ?Cefazolin 3/20 ? ?Discharge Exam: ?Vitals:  ? 03/21/22 1146 03/21/22 1306  ?BP:    ?Pulse: 76   ?Resp: 16 (!) 22  ?Temp:    ?SpO2: 96%   ? ?        ?  ?Constitutional: Awake and alert ?Respiratory: Lung sounds are clear to auscultation, 4.0 cuffless to trach, noted PMSV in place and able to phonate well, stable on 5 L/21% O2-nonlabored respiratory effort ?Cardiovascular: Regular rate and rhythm, No extremity edema.  Normotensive ?Abdomen: + tenderness when palpating directly over PEG site, Bowel sounds positive.  LBM 4/3 ?Musculoskeletal: no clubbing / cyanosis.  Generalized hypertonic muscle tone has significantly improved ?Neurologic: CN 2-12 appears to be grossly intact. Sensation appears to be grossly intact especially on the right.  Quickly follow simple commands with right upper extremity.  Unable to  move right lower extremity as well.  Noted with apparent hyperesthesias on left upper extremity no movement left lower extremity.  Appears to have a left hemianopsia ?Psychiatric: Awake and alert.  Oriented to name and place.  Not to year ? ? ?Discharge Instructions ? ? ?Discharge Instructions   ? ? Ambulatory  referral to Neurology   Complete by: As directed ?  ? Follow up with stroke clinic NP (Jessica [place] or Cecille Rubin, if both not available, consider Zachery Dauer, or [place]) at [facility] in about 4 weeks. Thanks.

## 2022-03-21 NOTE — Plan of Care (Signed)
?  Problem: Activity: ?Goal: Risk for activity intolerance will decrease ?Outcome: Progressing ?  ?Problem: Nutrition: ?Goal: Adequate nutrition will be maintained ?Outcome: Progressing ?  ?Problem: Safety: ?Goal: Ability to remain free from injury will improve ?Outcome: Progressing ?  ?Problem: Education: ?Goal: Knowledge of disease or condition will improve ?Outcome: Progressing ?Goal: Knowledge of patient specific risk factors will improve (INDIVIDUALIZE FOR PATIENT) ?Outcome: Progressing ?  ?

## 2022-03-21 NOTE — Progress Notes (Signed)
Occupational Therapy Treatment ?Patient Details ?Name: Sarah Myers ?MRN: [id number] ?DOB: 02/23/1971 ?Today's Date: 03/21/2022 ? ? ?History of present illness Pt is 51 y.o. female presents to [facility] hospital on 02/05/2022 with L hemiplegia and dysarthria. 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 Pt intubated 2/19. Attempted extubation 2/23 and 2/26, requiring reintubation same date. Tracheostomy 02/14/2022. PMH includes uncontrolled HTN ?  ?OT comments ? Patient seated on [facility] with daughter present upon entry. Patient performed 2 stands within stedy to perform hygiene with increased time to rest between stands.  Patient was transferred to recliner with [facility]. Patient tolerated PROM to RUE to address tone and pain with patient stating decreased pain following treatment. Patient is expected to discharge to [facility] today.   ? ?Recommendations for follow up therapy are one component of a multi-disciplinary discharge planning process, led by the attending physician.  Recommendations may be updated based on patient status, additional functional criteria and insurance authorization. ?   ?Follow Up Recommendations ? Other (comment) ([place] at [facility])  ?  ?Assistance Recommended at Discharge Frequent or constant Supervision/Assistance  ?Patient can return home with the following ? Two people to help with walking and/or transfers;Two people to help with bathing/dressing/bathroom;Assistance with feeding;Help with stairs or ramp for entrance;Assist for transportation;Assistance with cooking/housework;Direct supervision/assist for medications management ?  ?Equipment Recommendations ? Hospital bed;Wheelchair cushion (measurements OT);Wheelchair (measurements OT)  ?  ?Recommendations for Other Services   ? ?  ?Precautions / Restrictions Precautions ?Precautions: Fall ?Precaution Comments: L inattention, PEG, trach ?Restrictions ?Weight Bearing Restrictions: No   ? ? ?  ? ?Mobility Bed Mobility ?Overal bed mobility: Needs Assistance ?  ?  ?  ?  ?  ?  ?General bed mobility comments: up on 3n1 upon entry and in recliner at end of session ?  ? ?Transfers ?Overall transfer level: Needs assistance ?Equipment used: Ambulation equipment used ?Transfers: Sit to/from Stand, Bed to chair/wheelchair/BSC ?Sit to Stand: Max assist ?  ?  ?  ?  ?  ?General transfer comment: stood x2 in stedy to perform toilet hygiene and transferred to recliner with stedy ?Transfer via Lift Equipment: Stedy ?  ?Balance Overall balance assessment: Needs assistance ?Sitting-balance support: No upper extremity supported ?Sitting balance-Leahy Scale: Fair ?Sitting balance - Comments: patient sitting on BSC upon entry ?  ?Standing balance support: Bilateral upper extremity supported ?Standing balance-Leahy Scale: Poor ?Standing balance comment: stood within [place] with limited standing tolerance ?  ?  ?  ?  ?  ?  ?  ?  ?  ?  ?  ?   ? ?ADL either performed or assessed with clinical judgement  ? ?ADL Overall ADL's : Needs assistance/impaired ?  ?  ?Grooming: Therapist, nutritional;Supervision/safety ?Grooming Details (indicate cue type and reason): able to use RUE to wash face ?  ?  ?  ?  ?  ?  ?  ?  ?Toilet Transfer: Maximal assistance;BSC/3in1 ?Toilet Transfer Details (indicate cue type and reason): Stedy used for transfer ?Toileting- Clothing Manipulation and Hygiene: Total assistance;Sit to/from stand ?Toileting - Clothing Manipulation Details (indicate cue type and reason): patient stood within [place] for toilet hygiene ?  ?  ?  ?General ADL Comments: patient on [facility] upon entry and assisted patient with standing in stedy for hygiene ?  ? ?Extremity/Trunk Assessment Upper Extremity Assessment ?LUE Deficits / Details: flaccid throughout LUE, with incresed swelling. ?LUE Sensation: decreased light touch ?LUE Coordination: decreased fine motor;decreased gross motor ?  ?  ?  ?  ?  ? ?  Vision   ?  ?  ?Perception   ?  ?Praxis    ?  ? ?Cognition Arousal/Alertness: Awake/alert ?Behavior During Therapy: [facility] for tasks assessed/performed ?Overall Cognitive Status: Impaired/Different from baseline ?  ?  ?  ?  ?  ?  ?  ?  ?  ?Orientation Level: Disoriented to, Time ?Current Attention Level: Sustained ?  ?Following Commands: Follows one step commands consistently, Follows multi-step commands inconsistently ?Safety/Judgement: Decreased awareness of deficits ?  ?Problem Solving: Difficulty sequencing, Requires verbal cues, Requires tactile cues ?General Comments: alert and vocal.  asked often why LUE was painful ?  ?  ?   ?Exercises Exercises: General Upper Extremity ?General Exercises - Upper Extremity ?Shoulder Flexion: PROM, Left, 10 reps, Seated ?Shoulder Extension: PROM, Left, 10 reps, Seated ?Shoulder ABduction: PROM, Left, 10 reps, Seated ?Elbow Flexion: PROM, Left, 10 reps ?Elbow Extension: PROM, Left, 10 reps, Seated ?Wrist Flexion: PROM, Left, 10 reps ?Wrist Extension: PROM, Left ?Digit Composite Flexion: PROM, Left ?Composite Extension: PROM, Left ? ?  ?Shoulder Instructions   ? ? ?  ?General Comments    ? ? ?Pertinent Vitals/ Pain       Pain Assessment ?Pain Assessment: Faces ?Faces Pain Scale: Hurts little more ?Pain Location: L shoulder ?Pain Descriptors / Indicators: Tender ?Pain Intervention(s): Limited activity within patient's tolerance, Monitored during session, Repositioned ? ?Home Living   ?  ?  ?  ?  ?  ?  ?  ?  ?  ?  ?  ?  ?  ?  ?  ?  ?  ?  ? ?  ?Prior Functioning/Environment    ?  ?  ?  ?   ? ?Frequency ? Min 2X/week  ? ? ? ? ?  ?Progress Toward Goals ? ?OT Goals(current goals can now be found in the care plan section) ? Progress towards OT goals: Progressing toward goals ? ?Acute Rehab OT Goals ?OT Goal Formulation: Patient unable to participate in goal setting ?Time For Goal Achievement: 03/29/22 ?Potential to Achieve Goals: Fair ?ADL Goals ?Pt Will Perform Grooming: with min assist;sitting ?Additional ADL Goal #1: Patient  will perfrom supine to sit with Mod A to increase independence with toileting. ?Additional ADL Goal #2: Patient will follow two step commands 50% of the time with Min VC's  ?Plan Discharge plan remains appropriate   ? ?Co-evaluation ? ? ?   ?  ?  ?  ?  ? ?  ?AM-PAC OT "6 Clicks" Daily Activity     ?Outcome Measure ? ? Help from another person eating meals?: A Lot ?Help from another person taking care of personal grooming?: A Lot ?Help from another person toileting, which includes using toliet, bedpan, or urinal?: Total ?Help from another person bathing (including washing, rinsing, drying)?: A Lot ?Help from another person to put on and taking off regular upper body clothing?: A Lot ?Help from another person to put on and taking off regular lower body clothing?: Total ?6 Click Score: 10 ? ?  ?End of Session Equipment Utilized During Treatment: Other (comment) Antony Salmon) ? ?OT Visit Diagnosis: Muscle weakness (generalized) (M62.81);Other symptoms and signs involving cognitive function;Hemiplegia and hemiparesis ?Hemiplegia - Right/Left: Left ?Hemiplegia - dominant/non-dominant: Dominant ?Hemiplegia - caused by: Nontraumatic intracerebral hemorrhage ?  ?Activity Tolerance Patient tolerated treatment well ?  ?Patient Left in chair;with call bell/phone within reach;with chair alarm set;with family/visitor present ?  ?Nurse Communication Mobility status ?  ? ?   ? ?Time: 7793-9030 ?OT Time Calculation (min):  31 min ? ?Charges: OT General Charges ?$OT Visit: 1 Visit ?OT Treatments ?$Self Care/Home Management : 8-22 mins ?$Neuromuscular Re-education: 8-22 mins ? ?Alfonse Flavors, OTA ?Acute Rehabilitation Services  ?Pager [phone] ?Office [phone] ? ? ?Sarah Myers ?03/21/2022, 1:42 PM ?

## 2022-03-21 NOTE — Progress Notes (Signed)
Pt trach was capped. Pt is currently stable sats @ 98%> 80 HR. No signs of stridor at the moment. RT will continue to monitor for any changes  ?

## 2022-03-21 NOTE — Progress Notes (Addendum)
? ?NAME:  Sarah Myers, MRN:  [id number], DOB:  May 19, 1971, LOS: 44 ?ADMISSION DATE:  02/05/2022, CONSULTATION DATE:  2/19 ?REFERRING MD:  Selina Cooley - neuro, CHIEF COMPLAINT:  Left sided weakness, slurred speech  ? ?History of Present Illness:  ?51 y/o female admitted to [facility] on 2/19 with left sided weakness and slurred speech.  Work up consistent with  a right ICH with "brain compression" present on admission.  Required intubation for airway protection, 3% saline, & cleviprex.  Hospital course complicated by prolonged respiratory failure with failed extubations due to stridor requiring tracheostomy (2/28).  ? ?Pertinent  Medical History  ?Hypertension ? ?Significant Hospital Events: ?Including procedures, antibiotic start and stop dates in addition to other pertinent events   ?2/19 admitted to neuro with acute R ICH. Intubated for airway protection. Started on 3% and  cleviprex. PCCM consult. NSGY consult  ?02/06/2022 For repeat CT Head >> Overnight CT showed increase in size of bleed to 36 ml , but unchanged midline shift at 4 mm . Repeating CT head again 2/20 am. Neurosurgery is aware ?2/21 3% saline stopped due to Na 158. weaned off cleviprex. Remains intubated. ?2/22 neuro exam much improved (moves RUE and RLE to command; thumbs up; blinks eyes to command; sticks tongue out); sinus pause overnight w/ inferiolateral ST elevation overnight; troponin 387 then 269 ?2/23 failed extubation due to stridor. Reintubated ?2/24 Diuresis ?2/26 failed 2nd attempt at extubation. CT head ICH without rebleeding. Hematoma and edema causes 4 mm of midline shift. 2. Bilateral cribriform plate cephalocele. ?2/28 tracheostomy by Kendrick Fries, stopped versed infusion ?3/1 trach collar x1 hour ?3/2 worked with PT ?3/5 Off vent 24 hours  ?3/10 Mild hemoptysis from trach on heparin infusion ?03/01/2022 no overt hemoptysis noted remains on heparin infusion ?03/01/2022 changed to cuffless trach ?3/26 trach displaced and she desatted to 78%, 4  cuff less shiley reinserted by RT ?4/3 Awake and alert, PM valve in and speaking appropriately. Tolerating Dysphagia 2 Diet.  ? ?Interim History / Subjective:  ?Wants trach out.  ? ?Objective   ?Blood pressure (Abnormal) 143/77, pulse 72, temperature 98.7 ?F (37.1 ?C), temperature source Oral, resp. rate 18, height 5\' 4"  (1.626 m), weight 74.1 kg, SpO2 97 %. ?   ?   ? ?Intake/Output Summary (Last 24 hours) at 03/21/2022 1116 ?Last data filed at 03/21/2022 0600 ?Gross per 24 hour  ?Intake 1415 ml  ?Output 750 ml  ?Net 665 ml  ? ? ?Filed Weights  ? 03/18/22 0517 03/19/22 0600 03/21/22 0500  ?Weight: 70.7 kg 72.6 kg 74.1 kg  ? ? ?Examination: ?General resting in bed. No distress.  ?HENT NCAT 4 capped trach good phonation  ?Pulm some exp wheeze. No accessory use.  ?Card rrr ?Abd soft ?Ext warm  ?Neuro awake. Left sided weakness ? ?Resolved Hospital Problem list   ?MSSA/Serratia pneumonia, fever on 2/28 due to pneumonia  ?AKI   ?Iatrogenic hypernatremia from hypertonic saline given for brain swelling  ?Agitated Delirium  ?Hypertensive Emergency  ?Acute Cardiogenic Pulmonary Edema  ?Stridor due to upper airway edema, failed extubation x 2 ?Hemoptysis  ? ?Assessment & Plan:  ?  ?Acute Hypoxemic Respiratory Failure s/p Tracheostomy  ?Pulmonary Embolism  ?Right temporal hematoma with brain compression   ?Acute Encephalopathy in setting of Stroke  ? ?Discussion ?Started capping trials this am. She looks good. Excellent phonation, good cough. No rush of air when trach removed.  ? ?Plan ?Keep capped overnight  ?Anticipate decannulation tomorrow.  ? ? ?Best Practice (right click  and "Reselect all SmartList Selections" daily)  ?Per primary ? ?Simonne Martinet ACNP-BC ?[facility] ?Pager # [phone] OR # [phone] if no answer ? ? ? ? ? ? ? ? ? ? ? ?

## 2022-03-21 NOTE — TOC Progression Note (Signed)
Transition of Care (TOC) - Progression Note  ? ? ?Patient Details  ?Name: Sarah Myers ?MRN: [id number] ?Date of Birth: 1971/05/30 ? ?Transition of Care (TOC) CM/SW Contact  ?Curlene Labrum, RN ?Phone Number: ?03/21/2022, 1:52 PM ? ?Clinical Narrative:    ?CM met with the patient and daughters, [name] and Pearline Cables, at the bedside and updated that the patient will be discharging to [facility] around 7 pm.  Erin Hearing, NP updated discharge summary and discharge orders.  Bedside nursing - please see Shon Baton, MSW to call nursing report and transport the patient to [facility].  [facility] has access to Epic discharge summary and orders so formal discharge packet not needed. ? ? ?Expected Discharge Plan: [place] ([facility]) ?Barriers to Discharge: Continued Medical Work up ? ?Expected Discharge Plan and Services ?Expected Discharge Plan: [place] ([facility]) ?In-house Referral: Clinical Social Work, Development worker, community ?Discharge Planning Services: CM Consult ?Post Acute Care Choice: [place] ?Living arrangements for the past 2 months: Apartment ?Expected Discharge Date: 03/21/22               ?  ?  ?  ?  ?  ?  ?  ?  ?  ?  ? ? ?Social Determinants of Health (SDOH) Interventions ?  ? ?Readmission Risk Interventions ? ?  03/09/2022  ?  9:54 AM  ?Readmission Risk Prevention Plan  ?Transportation Screening Complete  ?PCP or Specialist Appt within 3-5 Days Complete  ?[place] or Home Care Consult Not Complete  ?Social Work Consult for [place] Planning/Counseling Complete  ?Medication Review Press photographer) Complete  ? ? ?

## 2022-03-21 NOTE — Progress Notes (Addendum)
Patient will discharge to [facility] this evening around 7pm. The patient will go to room 30. The number to call for report is [phone]. ? ?RN aware of discharge plan. ? ?Madilyn Fireman, MSW, LCSW ?Transitions of Care  Clinical Social Worker II ?[phone] ? ?

## 2022-03-21 NOTE — Progress Notes (Signed)
Patient ID: Sarah Myers, female   DOB: [birth year], 51 y.o.   MRN: [id number] ? ? ? ?Progress Note from the Palliative Medicine Team at [facility] ? ? ?Patient Name: Sarah Myers        ?Date: 03/21/2022 ?DOB: Jun 03, 1971  Age: 51 y.o. MRN#: [id number] ?Attending Physician: Osvaldo Shipper, MD ?Primary Care Physician: [name], MD ?Admit Date: 02/05/2022 ? ? ?Medical records reviewed  ? ?Goals are clear.  Plan is for SNF for ongoing rehab.  PMT will sign off at this time.  Re-consult PMT for any needs in the  future.   ? ? ?Lorinda Creed NP  ?Palliative Medicine Team Team Phone # [phone] ?Pager [phone] ?  ? ?No charge ?

## 2022-03-22 ENCOUNTER — Inpatient Hospital Stay
Admission: RE | Admit: 2022-03-22 | Discharge: 2022-04-04 | Disposition: A | Discharge status: Rehabilitation Facility | Payer: 59 | Source: Other Acute Inpatient Hospital

## 2022-03-22 ENCOUNTER — Other Ambulatory Visit (HOSPITAL_COMMUNITY): Payer: 59

## 2022-03-22 DIAGNOSIS — I619 Nontraumatic intracerebral hemorrhage, unspecified: Secondary | ICD-10-CM | POA: Diagnosis not present

## 2022-03-22 DIAGNOSIS — J9621 Acute and chronic respiratory failure with hypoxia: Secondary | ICD-10-CM

## 2022-03-22 DIAGNOSIS — Z930 Tracheostomy status: Secondary | ICD-10-CM

## 2022-03-22 DIAGNOSIS — I69391 Dysphagia following cerebral infarction: Secondary | ICD-10-CM

## 2022-03-22 DIAGNOSIS — I2699 Other pulmonary embolism without acute cor pulmonale: Secondary | ICD-10-CM | POA: Diagnosis not present

## 2022-03-22 DIAGNOSIS — R1310 Dysphagia, unspecified: Secondary | ICD-10-CM

## 2022-03-22 LAB — BASIC METABOLIC PANEL
Anion gap: 8 (ref 5–15)
BUN: 23 mg/dL — ABNORMAL HIGH (ref 6–20)
CO2: 25 mmol/L (ref 22–32)
Calcium: 10.6 mg/dL — ABNORMAL HIGH (ref 8.9–10.3)
Chloride: 104 mmol/L (ref 98–111)
Creatinine, Ser: 0.95 mg/dL (ref 0.44–1.00)
GFR, Estimated: >60 mL/min (ref >60)
Glucose, Bld: 96 mg/dL (ref 70–99)
Potassium: 3.7 mmol/L (ref 3.5–5.1)
Sodium: 137 mmol/L (ref 135–145)

## 2022-03-22 LAB — CBC
HCT: 35.8 % — ABNORMAL LOW (ref 36.0–46.0)
Hemoglobin: 11.5 g/dL — ABNORMAL LOW (ref 12.0–15.0)
MCH: 29.2 pg (ref 26.0–34.0)
MCHC: 32.1 g/dL (ref 30.0–36.0)
MCV: 90.9 fL (ref 80.0–100.0)
Platelets: 159 10*3/uL (ref 150–400)
RBC: 3.94 MIL/uL (ref 3.87–5.11)
RDW: 14.6 % (ref 11.5–15.5)
WBC: 6.6 10*3/uL (ref 4.0–10.5)
nRBC: 0 % (ref 0.0–0.2)

## 2022-03-22 LAB — GLUCOSE, CAPILLARY: Glucose-Capillary: 165 mg/dL — ABNORMAL HIGH (ref 70–99)

## 2022-03-22 MED ORDER — DIATRIZOATE MEGLUMINE & SODIUM 66-10 % PO SOLN
ORAL | Status: AC
  Filled 2022-03-22: qty 30

## 2022-03-22 NOTE — Consult Note (Signed)
Pulmonary Critical Care Medicine ?[facility]  ?PULMONARY SERVICE ? ?Date of Service: 03/22/2022 ? ?PULMONARY CRITICAL CARE CONSULT ? ? ?Warden Fillers  ?[id number]  ?DOB: [birth year]  ? ?DOA: 03/22/2022 ? ?Referring Physician: Satira Sark, MD ? ?HPI: Sarah Myers is a 51 y.o. female seen for follow up of Acute on Chronic Respiratory Failure.  Patient has multiple medical problems including hypertension came into the hospital because of acute onset of left-sided weakness.  Patient also had slurred speech.  At the time of evaluation was found to have a dense hemiplegia and intracranial hemorrhage.  She was admitted to the ICU she was treated with 3% saline intubated placed on mechanical ventilation.  The patient underwent tracheostomy because of failure to tolerate extubation.  Tracheostomy was done on February 28 and currently she presents to [place] on T collar and has a PMV and with a cuffless trach ? ?Review of Systems:  ?ROS performed and is unremarkable other than noted above. ? ?Past Medical History:  ?Diagnosis Date  ? Hypertension   ? ? ?Past Surgical History:  ?Procedure Laterality Date  ? BREAST LUMPECTOMY  1992  ? CESAREAN SECTION    ? IR GASTROSTOMY TUBE MOD SED  03/06/2022  ? ORIF RADIAL FRACTURE  02/10/2012  ? Procedure: OPEN REDUCTION INTERNAL FIXATION (ORIF) RADIAL FRACTURE;  Surgeon: Meredith Pel, MD;  Location: [facility];  Service: Orthopedics;  Laterality: Right;  ? ORIF ULNAR FRACTURE  02/10/2012  ? Procedure: OPEN REDUCTION INTERNAL FIXATION (ORIF) ULNAR FRACTURE;  Surgeon: Meredith Pel, MD;  Location: [facility];  Service: Orthopedics;  Laterality: Right;  ? TUBAL LIGATION    ? ? ?Social History:  ? ? reports that she has never smoked. She has never used smokeless tobacco. She reports that she does not currently use alcohol. She reports that she does not use drugs. ? ?Family History: ?Non-Contributory to the present illness ? ?Allergies  ?Allergen Reactions  ?  Lisinopril Cough  ? ? ?Medications: ?Reviewed on Rounds ? ?Physical Exam: ? ?Vitals: Temperature is 98.0 pulse 100 respiratory 18 blood pressure was not recorded saturations 98% ? ?Ventilator Settings off the ventilator on T collar ? ?General: Comfortable at this time ?Eyes: Grossly normal lids, irises & conjunctiva ?ENT: grossly tongue is normal ?Neck: no obvious mass ?Cardiovascular: S1-S2 normal no gallop or rub ?Respiratory: Coarse rhonchi expansion equal ?Abdomen: Soft and nontender ?Skin: no rash seen on limited exam ?Musculoskeletal: not rigid ?Psychiatric:unable to assess ?Neurologic: no seizure no involuntary movements   ?   ?   ?Labs on Admission:  ?Basic Metabolic Panel: ?Recent Labs  ?Lab 03/18/22 ?0238 03/20/22 ?0402 03/20/22 ?[id number] 03/21/22 ?0430 03/22/22 ?[id number]  ?NA 141 139 137 138 137  ?K 3.9 4.9 4.0 3.7 3.7  ?CL 109 109 104 107 104  ?CO2 25 21* 26 20* 25  ?GLUCOSE 133* 128* 134* 151* 96  ?BUN 49* 28* 24* 22* 23*  ?CREATININE 0.99 1.01* 0.91 0.93 0.95  ?CALCIUM 10.9* 11.0* 10.9* 10.3 10.6*  ? ? ?No results for input(s): PHART, PCO2ART, PO2ART, HCO3, O2SAT in the last 168 hours. ? ?Liver Function Tests: ?Recent Labs  ?Lab 03/17/22 ?0243 03/20/22 ?0402  ?AST 22 29  ?ALT 28 32  ?ALKPHOS 73 69  ?BILITOT 0.5 0.4  ?PROT 7.6 7.1  ?ALBUMIN 3.7 3.3*  ? ?No results for input(s): LIPASE, AMYLASE in the last 168 hours. ?No results for input(s): AMMONIA in the last 168 hours. ? ?CBC: ?Recent Labs  ?Lab 03/17/22 ?[phone]  03/21/22 ?0430 03/22/22 ?[id number]  ?WBC 8.6 6.8 6.6  ?NEUTROABS 5.3  --   --   ?HGB 11.8* 11.7* 11.5*  ?HCT 36.9 36.4 35.8*  ?MCV 91.6 92.6 90.9  ?PLT 175 139* 159  ? ? ?Cardiac Enzymes: ?No results for input(s): CKTOTAL, CKMB, CKMBINDEX, TROPONINI in the last 168 hours. ? ?BNP (last 3 results) ?No results for input(s): BNP in the last 8760 hours. ? ?ProBNP (last 3 results) ?No results for input(s): PROBNP in the last 8760 hours. ? ? ?Radiological Exams on Admission: ?DG ABDOMEN PEG TUBE LOCATION ? ?Result  Date: 03/22/2022 ?CLINICAL DATA:  Peg placement EXAM: ABDOMEN - 1 VIEW COMPARISON:  Abdominal CT 03/09/2022 FINDINGS: Contrast injection into a PEG tube opacifies the stomach without visible extravasation. Visualized bowel gas pattern is normal. IMPRESSION: Located percutaneous gastrostomy tube. Electronically Signed   By: Jorje Guild M.D.   On: 03/22/2022 06:01  ? ?DG Chest Port 1 View ? ?Result Date: 03/22/2022 ?CLINICAL DATA:  Shortness of breath. EXAM: PORTABLE CHEST 1 VIEW COMPARISON:  02/25/2019 3D FINDINGS: The tracheostomy tube is stable. Stable borderline cardiac enlargement. The mediastinal and hilar contours are within normal limits. The lungs are clear of an acute process. No infiltrates or effusions. The bony thorax is intact. IMPRESSION: No acute cardiopulmonary findings. Electronically Signed   By: Marijo Sanes M.D.   On: 03/22/2022 15:02   ? ?Assessment/Plan ?Active Problems: ?  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  Acute on chronic respiratory failure with hypoxia (HCC) ?  Bilateral pulmonary embolism (HCC) ?  Tracheostomy dependent ([place]) ?  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 ? ?Acute on chronic respiratory failure hypoxia the patient is doing fine with the T-piece the plan is going to be to continue to advance her weaning she should be able to do capping trials we will start that in the morning ?Nontraumatic intracranial hemorrhage supportive care she will need physical therapy evaluation and assessment treatment ?Dysphagia as a result of the stroke supportive care we will have speech therapy evaluate for eating ?Bilateral pulmonary emboli has been on anticoagulation cleared for anticoagulation for Eliquis ?Tracheostomy status we will work on capping trials and eventual decannulation ? ?I have personally seen and evaluated the patient, evaluated laboratory and imaging results, formulated the assessment and plan and placed orders. ?The Patient requires high  complexity decision making with multiple systems involvement.  ?Case was discussed on Rounds with the Respiratory Therapy Director and the Respiratory staff ?Time Spent 3minutes ? ?Allyne Gee, MD FCCP ?Pulmonary Critical Care Medicine ?Sleep Medicine ? ?

## 2022-03-23 DIAGNOSIS — I619 Nontraumatic intracerebral hemorrhage, unspecified: Secondary | ICD-10-CM | POA: Diagnosis not present

## 2022-03-23 DIAGNOSIS — J9621 Acute and chronic respiratory failure with hypoxia: Secondary | ICD-10-CM | POA: Diagnosis not present

## 2022-03-23 DIAGNOSIS — I2699 Other pulmonary embolism without acute cor pulmonale: Secondary | ICD-10-CM | POA: Diagnosis not present

## 2022-03-23 DIAGNOSIS — I69391 Dysphagia following cerebral infarction: Secondary | ICD-10-CM | POA: Diagnosis not present

## 2022-03-23 NOTE — Progress Notes (Addendum)
Pulmonary Critical Care Medicine ?[facility] [place] ?  ?PULMONARY CRITICAL CARE SERVICE ? ?PROGRESS NOTE ? ? ? ? ?Sarah Myers  ?[id number]:[id number]  ?DOB: [birth year]  ? ?DOA: 03/22/2022 ? ?Referring Physician: Luna Kitchens, MD ? ?HPI: Sarah Myers is a 51 y.o. female being followed for ventilator/airway/oxygen weaning Acute on Chronic Respiratory Failure.  Patient seen sitting up in bed.  Currently on ATC 20% with PMV in place.  No acute distress, no acute overnight events. ? ?Medications: ?Reviewed on Rounds ? ?Physical Exam: ? ?Vitals: Temp 97.3, pulse 69, respirations 21, BP 122/68, SPO2 98% ? ?Ventilator Settings ATC 28% with PMV in place ? ?General: Comfortable at this time ?Neck: supple ?Cardiovascular: no malignant arrhythmias ?Respiratory: Left lung managed in all lobes, right lung clear on inspiration and coarse on expiration ?Skin: no rash seen on limited exam ?Musculoskeletal: No gross abnormality ?Psychiatric:unable to assess ?Neurologic:no involuntary movements   ?   ?   ?Lab Data:  ? ?Basic Metabolic Panel: ?Recent Labs  ?Lab 03/18/22 ?0238 03/20/22 ?0402 03/20/22 ?1950 03/21/22 ?0430 03/22/22 ?9326  ?NA 141 139 137 138 137  ?K 3.9 4.9 4.0 3.7 3.7  ?CL 109 109 104 107 104  ?CO2 25 21* 26 20* 25  ?GLUCOSE 133* 128* 134* 151* 96  ?BUN 49* 28* 24* 22* 23*  ?CREATININE 0.99 1.01* 0.91 0.93 0.95  ?CALCIUM 10.9* 11.0* 10.9* 10.3 10.6*  ? ? ?ABG: ?No results for input(s): PHART, PCO2ART, PO2ART, HCO3, O2SAT in the last 168 hours. ? ?Liver Function Tests: ?Recent Labs  ?Lab 03/17/22 ?0243 03/20/22 ?0402  ?AST 22 29  ?ALT 28 32  ?ALKPHOS 73 69  ?BILITOT 0.5 0.4  ?PROT 7.6 7.1  ?ALBUMIN 3.7 3.3*  ? ?No results for input(s): LIPASE, AMYLASE in the last 168 hours. ?No results for input(s): AMMONIA in the last 168 hours. ? ?CBC: ?Recent Labs  ?Lab 03/17/22 ?0243 03/21/22 ?0430 03/22/22 ?7124  ?WBC 8.6 6.8 6.6  ?NEUTROABS 5.3  --   --   ?HGB 11.8* 11.7* 11.5*  ?HCT 36.9 36.4 35.8*  ?MCV 91.6 92.6  90.9  ?PLT 175 139* 159  ? ? ?Cardiac Enzymes: ?No results for input(s): CKTOTAL, CKMB, CKMBINDEX, TROPONINI in the last 168 hours. ? ?BNP (last 3 results) ?No results for input(s): BNP in the last 8760 hours. ? ?ProBNP (last 3 results) ?No results for input(s): PROBNP in the last 8760 hours. ? ?Radiological Exams: ?DG ABDOMEN PEG TUBE LOCATION ? ?Result Date: 03/22/2022 ?CLINICAL DATA:  Peg placement EXAM: ABDOMEN - 1 VIEW COMPARISON:  Abdominal CT 03/09/2022 FINDINGS: Contrast injection into a PEG tube opacifies the stomach without visible extravasation. Visualized bowel gas pattern is normal. IMPRESSION: Located percutaneous gastrostomy tube. Electronically Signed   By: Tiburcio Pea M.D.   On: 03/22/2022 06:01  ? ?DG Chest Port 1 View ? ?Result Date: 03/22/2022 ?CLINICAL DATA:  Shortness of breath. EXAM: PORTABLE CHEST 1 VIEW COMPARISON:  02/25/2019 3D FINDINGS: The tracheostomy tube is stable. Stable borderline cardiac enlargement. The mediastinal and hilar contours are within normal limits. The lungs are clear of an acute process. No infiltrates or effusions. The bony thorax is intact. IMPRESSION: No acute cardiopulmonary findings. Electronically Signed   By: Rudie Meyer M.D.   On: 03/22/2022 15:02   ? ?Assessment/Plan ?Active Problems: ?  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  Acute on chronic respiratory failure with hypoxia (HCC) ?  Bilateral pulmonary embolism (HCC) ?  Tracheostomy dependent (HCC) ?  Dysphagia  due to recent stroke/moderate PCM ? ? ?Chronic respiratory failure with hypoxia-patient continues to tolerate T-piece well.  Continue advances weaning.  Orders placed for capping trials as tolerated. ?Nontraumatic intracranial hemorrhage-continue with supportive care, continue with physical therapy ?Dysphagia-secondary to stroke, speech evaluated for eating, continue supportive care ?Bilateral pulmonary emboli, has been anticoagulated, cleared for anticoagulation on  Eliquis ?Tracheostomy-remains in place, capping trials as evaluated today.  Continue to evaluate for decannulation. ? ? ?I have personally seen and evaluated the patient, evaluated laboratory and imaging results, formulated the assessment and plan and placed orders. ?The Patient requires high complexity decision making with multiple systems involvement.  ?Rounds were done with the Respiratory Therapy Director and Staff therapists and discussed with nursing staff also. ? ?Yevonne Pax, MD FCCP ?Pulmonary Critical Care Medicine ?Sleep Medicine  ?

## 2022-03-24 DIAGNOSIS — I69391 Dysphagia following cerebral infarction: Secondary | ICD-10-CM | POA: Diagnosis not present

## 2022-03-24 DIAGNOSIS — J9621 Acute and chronic respiratory failure with hypoxia: Secondary | ICD-10-CM | POA: Diagnosis not present

## 2022-03-24 DIAGNOSIS — I2699 Other pulmonary embolism without acute cor pulmonale: Secondary | ICD-10-CM | POA: Diagnosis not present

## 2022-03-24 DIAGNOSIS — I619 Nontraumatic intracerebral hemorrhage, unspecified: Secondary | ICD-10-CM | POA: Diagnosis not present

## 2022-03-24 NOTE — Progress Notes (Addendum)
Pulmonary Critical Care Medicine ?[facility] [place] ?  ?PULMONARY CRITICAL CARE SERVICE ? ?PROGRESS NOTE ? ? ? ? ?[name]  ?[id number]:[id number]  ?DOB: 1971-06-04  ? ?DOA: 03/22/2022 ? ?Referring Physician: Luna Kitchens, MD ? ?HPI: Sarah Myers is a 51 y.o. female being followed for ventilator/airway/oxygen weaning Acute on Chronic Respiratory Failure.  Patient seen lying in bed, currently capped and on room air.  She has been capped since 10:45 AM, approaching 24 hours.  She is eager to have her trach removed.  No acute distress, no acute overnight events. ? ?Medications: ?Reviewed on Rounds ? ?Physical Exam: ? ?Vitals: Temp 96.7, pulse 75, respirations 24, BP 140/82, SPO2 99% ? ?Ventilator Settings And on room air ? ?General: Comfortable at this time ?Neck: supple ?Cardiovascular: no malignant arrhythmias ?Respiratory: Left lung clear in all lobes, right lung with expiratory wheeze in lower lobe ?Skin: no rash seen on limited exam ?Musculoskeletal: No gross abnormality ?Psychiatric:unable to assess ?Neurologic:no involuntary movements   ?   ?   ?Lab Data:  ? ?Basic Metabolic Panel: ?Recent Labs  ?Lab 03/18/22 ?0238 03/20/22 ?0402 03/20/22 ?5284 03/21/22 ?0430 03/22/22 ?1324  ?NA 141 139 137 138 137  ?K 3.9 4.9 4.0 3.7 3.7  ?CL 109 109 104 107 104  ?CO2 25 21* 26 20* 25  ?GLUCOSE 133* 128* 134* 151* 96  ?BUN 49* 28* 24* 22* 23*  ?CREATININE 0.99 1.01* 0.91 0.93 0.95  ?CALCIUM 10.9* 11.0* 10.9* 10.3 10.6*  ? ? ?ABG: ?No results for input(s): PHART, PCO2ART, PO2ART, HCO3, O2SAT in the last 168 hours. ? ?Liver Function Tests: ?Recent Labs  ?Lab 03/20/22 ?0402  ?AST 29  ?ALT 32  ?ALKPHOS 69  ?BILITOT 0.4  ?PROT 7.1  ?ALBUMIN 3.3*  ? ?No results for input(s): LIPASE, AMYLASE in the last 168 hours. ?No results for input(s): AMMONIA in the last 168 hours. ? ?CBC: ?Recent Labs  ?Lab 03/21/22 ?0430 03/22/22 ?4010  ?WBC 6.8 6.6  ?HGB 11.7* 11.5*  ?HCT 36.4 35.8*  ?MCV 92.6 90.9  ?PLT 139* 159   ? ? ?Cardiac Enzymes: ?No results for input(s): CKTOTAL, CKMB, CKMBINDEX, TROPONINI in the last 168 hours. ? ?BNP (last 3 results) ?No results for input(s): BNP in the last 8760 hours. ? ?ProBNP (last 3 results) ?No results for input(s): PROBNP in the last 8760 hours. ? ?Radiological Exams: ?DG Chest Port 1 View ? ?Result Date: 03/22/2022 ?CLINICAL DATA:  Shortness of breath. EXAM: PORTABLE CHEST 1 VIEW COMPARISON:  02/25/2019 3D FINDINGS: The tracheostomy tube is stable. Stable borderline cardiac enlargement. The mediastinal and hilar contours are within normal limits. The lungs are clear of an acute process. No infiltrates or effusions. The bony thorax is intact. IMPRESSION: No acute cardiopulmonary findings. Electronically Signed   By: Rudie Meyer M.D.   On: 03/22/2022 15:02   ? ?Assessment/Plan ?Active Problems: ?  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  Acute on chronic respiratory failure with hypoxia (HCC) ?  Bilateral pulmonary embolism (HCC) ?  Tracheostomy dependent (HCC) ?  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 ? ?Acute on chronic respiratory failure with hypoxia-patient has been capped since 1045 yesterday morning and is tolerating well.  We will continue to evaluate for possible decannulation this weekend.  Continue with pulmonary toilet. ?Nontraumatic intracranial hemorrhage-continue with supportive care, continue with physical therapy. ?Dysphagia-secondary to stroke, speech has evaluated patient for eating, continue supportive care. ?Bilateral pulmonary emboli-has been anticoagulated, cleared for anticoagulation, on Eliquis. ?Tracheostomy-remains in  place, capping trials started yesterday, approaching 24 hours.  Continue to evaluate for possible decannulation this weekend. ? ? ?I have personally seen and evaluated the patient, evaluated laboratory and imaging results, formulated the assessment and plan and placed orders. ?The Patient requires high complexity decision  making with multiple systems involvement.  ?Rounds were done with the Respiratory Therapy Director and Staff therapists and discussed with nursing staff also. ? ?Yevonne Pax, MD FCCP ?Pulmonary Critical Care Medicine ?Sleep Medicine  ?

## 2022-03-25 DIAGNOSIS — I619 Nontraumatic intracerebral hemorrhage, unspecified: Secondary | ICD-10-CM | POA: Diagnosis not present

## 2022-03-25 DIAGNOSIS — J9621 Acute and chronic respiratory failure with hypoxia: Secondary | ICD-10-CM | POA: Diagnosis not present

## 2022-03-25 DIAGNOSIS — I69391 Dysphagia following cerebral infarction: Secondary | ICD-10-CM | POA: Diagnosis not present

## 2022-03-25 DIAGNOSIS — I2699 Other pulmonary embolism without acute cor pulmonale: Secondary | ICD-10-CM | POA: Diagnosis not present

## 2022-03-25 NOTE — Progress Notes (Addendum)
Pulmonary Critical Care Medicine ?[facility] [place] ?  ?PULMONARY CRITICAL CARE SERVICE ? ?PROGRESS NOTE ? ? ? ? ?Sarah Myers  ?[id number]:[id number]  ?DOB: Nov 16, 1971  ? ?DOA: 03/22/2022 ? ?Referring Physician: Luna Kitchens, MD ? ?HPI: Sarah Myers is a 51 y.o. female being followed for ventilator/airway/oxygen weaning Acute on Chronic Respiratory Failure.  Patient seen sitting up in bed this morning, daughter at bedside.  Patient has been capped almost 48 hours.  She had had panic attack this morning, required some DuoNebs.  Had lengthy discussion with patient and patient's daughter regarding comfort versus safety.  Daughter really does not want the patient Because of her anxiety.  Patient states she feels much better after respiratory treatment.  Patient does sound hoarse.  No acute distress, no acute overnight events ? ?Medications: ?Reviewed on Rounds ? ?Physical Exam: ? ?Vitals: Temp 97.3, pulse 60, respirations 19, BP 125/72, SPO2 95% ? ?Ventilator Settings capped and on room air ? ?General: Comfortable at this time ?Neck: supple ?Cardiovascular: no malignant arrhythmias ?Respiratory: Early diminished ?Skin: no rash seen on limited exam ?Musculoskeletal: No gross abnormality ?Psychiatric:unable to assess ?Neurologic:no involuntary movements   ?   ?   ?Lab Data:  ? ?Basic Metabolic Panel: ?Recent Labs  ?Lab 03/20/22 ?0402 03/20/22 ?3790 03/21/22 ?0430 03/22/22 ?2409  ?NA 139 137 138 137  ?K 4.9 4.0 3.7 3.7  ?CL 109 104 107 104  ?CO2 21* 26 20* 25  ?GLUCOSE 128* 134* 151* 96  ?BUN 28* 24* 22* 23*  ?CREATININE 1.01* 0.91 0.93 0.95  ?CALCIUM 11.0* 10.9* 10.3 10.6*  ? ? ?ABG: ?No results for input(s): PHART, PCO2ART, PO2ART, HCO3, O2SAT in the last 168 hours. ? ?Liver Function Tests: ?Recent Labs  ?Lab 03/20/22 ?0402  ?AST 29  ?ALT 32  ?ALKPHOS 69  ?BILITOT 0.4  ?PROT 7.1  ?ALBUMIN 3.3*  ? ?No results for input(s): LIPASE, AMYLASE in the last 168 hours. ?No results for input(s): AMMONIA in the  last 168 hours. ? ?CBC: ?Recent Labs  ?Lab 03/21/22 ?0430 03/22/22 ?7353  ?WBC 6.8 6.6  ?HGB 11.7* 11.5*  ?HCT 36.4 35.8*  ?MCV 92.6 90.9  ?PLT 139* 159  ? ? ?Cardiac Enzymes: ?No results for input(s): CKTOTAL, CKMB, CKMBINDEX, TROPONINI in the last 168 hours. ? ?BNP (last 3 results) ?No results for input(s): BNP in the last 8760 hours. ? ?ProBNP (last 3 results) ?No results for input(s): PROBNP in the last 8760 hours. ? ?Radiological Exams: ?No results found. ? ?Assessment/Plan ?Active Problems: ?  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  Acute on chronic respiratory failure with hypoxia (HCC) ?  Bilateral pulmonary embolism (HCC) ?  Tracheostomy dependent (HCC) ?  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 ?Acute on chronic respiratory failure with hypoxia-patient has been capped since 1045 April 6th and continues tolerating well.  We will continue to evaluate for possible decannulation this weekend.  Patient does have some anxiety with increased resistance due to capping.  Required DuoNeb this morning.  I have ordered every 4 hours as needed DuoNebs.  Continue with pulmonary toilet. ?Nontraumatic intracranial hemorrhage-continue with supportive care, continue with physical therapy. ?Dysphagia-secondary to stroke, speech has evaluated patient for eating, continue supportive care. ?Bilateral pulmonary emboli-has been anticoagulated, cleared for anticoagulation, on Eliquis. ?Tracheostomy-remains in place, capping trials started 2 days ago, approaching 48 hours.  Continue to evaluate for possible decannulation this weekend. ? ? ?I have personally seen and evaluated the patient, evaluated laboratory and imaging results,  formulated the assessment and plan and placed orders. ?The Patient requires high complexity decision making with multiple systems involvement.  ?Rounds were done with the Respiratory Therapy Director and Staff therapists and discussed with nursing staff also. ? ?Yevonne Pax,  MD FCCP ?Pulmonary Critical Care Medicine ?Sleep Medicine  ?

## 2022-03-26 DIAGNOSIS — J9621 Acute and chronic respiratory failure with hypoxia: Secondary | ICD-10-CM | POA: Diagnosis not present

## 2022-03-26 DIAGNOSIS — I2699 Other pulmonary embolism without acute cor pulmonale: Secondary | ICD-10-CM | POA: Diagnosis not present

## 2022-03-26 DIAGNOSIS — I69391 Dysphagia following cerebral infarction: Secondary | ICD-10-CM | POA: Diagnosis not present

## 2022-03-26 DIAGNOSIS — I619 Nontraumatic intracerebral hemorrhage, unspecified: Secondary | ICD-10-CM | POA: Diagnosis not present

## 2022-03-26 NOTE — Progress Notes (Addendum)
Pulmonary Critical Care Medicine ?[facility] [place] ?  ?PULMONARY CRITICAL CARE SERVICE ? ?PROGRESS NOTE ? ? ? ? ?[name]  ?[id number]:[id number]  ?DOB: 19-Aug-1971  ? ?DOA: 03/22/2022 ? ?Referring Physician: Luna Kitchens, MD ? ?HPI: Sarah Myers is a 51 y.o. female being followed for ventilator/airway/oxygen weaning Acute on Chronic Respiratory Failure.  Patient seen sitting up in bed eating breakfast.  Anxious to be decannulated.  Sounds less hoarse than yesterday.  Remains capped and on room air.  Repeat mainly telling me she is told she could go home.  No acute distress, no acute overnight events. ? ?Medications: ?Reviewed on Rounds ? ?Physical Exam: ? ?Vitals: Temp 97.5, pulse 73, respirations 30, BP 139/81, SPO2 99% ? ?Ventilator Settings capped and on room air ? ?General: Comfortable at this time ?Neck: supple ?Cardiovascular: no malignant arrhythmias ?Respiratory: Bilaterally diminished in all lobes ?Skin: no rash seen on limited exam ?Musculoskeletal: No gross abnormality ?Psychiatric:unable to assess ?Neurologic:no involuntary movements   ?   ?   ?Lab Data:  ? ?Basic Metabolic Panel: ?Recent Labs  ?Lab 03/20/22 ?0402 03/20/22 ?8850 03/21/22 ?0430 03/22/22 ?2774  ?NA 139 137 138 137  ?K 4.9 4.0 3.7 3.7  ?CL 109 104 107 104  ?CO2 21* 26 20* 25  ?GLUCOSE 128* 134* 151* 96  ?BUN 28* 24* 22* 23*  ?CREATININE 1.01* 0.91 0.93 0.95  ?CALCIUM 11.0* 10.9* 10.3 10.6*  ? ? ?ABG: ?No results for input(s): PHART, PCO2ART, PO2ART, HCO3, O2SAT in the last 168 hours. ? ?Liver Function Tests: ?Recent Labs  ?Lab 03/20/22 ?0402  ?AST 29  ?ALT 32  ?ALKPHOS 69  ?BILITOT 0.4  ?PROT 7.1  ?ALBUMIN 3.3*  ? ?No results for input(s): LIPASE, AMYLASE in the last 168 hours. ?No results for input(s): AMMONIA in the last 168 hours. ? ?CBC: ?Recent Labs  ?Lab 03/21/22 ?0430 03/22/22 ?1287  ?WBC 6.8 6.6  ?HGB 11.7* 11.5*  ?HCT 36.4 35.8*  ?MCV 92.6 90.9  ?PLT 139* 159  ? ? ?Cardiac Enzymes: ?No results for input(s):  CKTOTAL, CKMB, CKMBINDEX, TROPONINI in the last 168 hours. ? ?BNP (last 3 results) ?No results for input(s): BNP in the last 8760 hours. ? ?ProBNP (last 3 results) ?No results for input(s): PROBNP in the last 8760 hours. ? ?Radiological Exams: ?No results found. ? ?Assessment/Plan ?Active Problems: ?  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  Acute on chronic respiratory failure with hypoxia (HCC) ?  Bilateral pulmonary embolism (HCC) ?  Tracheostomy dependent (HCC) ?  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 ? ?Acute on chronic respiratory failure with hypoxia-patient has been capped since 1045 April 6th and continues tolerating well.  We will continue to evaluate for possible decannulation this week.  Patient does have some anxiety with increased resistance due to capping.  Required DuoNeb this morning.  Voice sounds less hoarse than yesterday.  PRN DuoNebs ordered yesterday.  Continue with pulmonary toilet. ?Nontraumatic intracranial hemorrhage-continue with supportive care, continue with physical therapy. ?Dysphagia-secondary to stroke, speech has evaluated patient for eating, continue supportive care. ?Bilateral pulmonary emboli-has been anticoagulated, cleared for anticoagulation, on Eliquis. ?Tracheostomy-remains in place, capping trials started 3 days ago, approaching 72 hours.  Continue to evaluate for possible decannulation this week. ? ? ?I have personally seen and evaluated the patient, evaluated laboratory and imaging results, formulated the assessment and plan and placed orders. ?The Patient requires high complexity decision making with multiple systems involvement.  ?Rounds were done with the Respiratory  Therapy Director and Staff therapists and discussed with nursing staff also. ? ?Allyne Gee, MD FCCP ?Pulmonary Critical Care Medicine ?Sleep Medicine  ?

## 2022-03-27 DIAGNOSIS — I619 Nontraumatic intracerebral hemorrhage, unspecified: Secondary | ICD-10-CM | POA: Diagnosis not present

## 2022-03-27 DIAGNOSIS — J9621 Acute and chronic respiratory failure with hypoxia: Secondary | ICD-10-CM | POA: Diagnosis not present

## 2022-03-27 DIAGNOSIS — I2699 Other pulmonary embolism without acute cor pulmonale: Secondary | ICD-10-CM | POA: Diagnosis not present

## 2022-03-27 DIAGNOSIS — I69391 Dysphagia following cerebral infarction: Secondary | ICD-10-CM | POA: Diagnosis not present

## 2022-03-27 LAB — CBC
HCT: 41.8 % (ref 36.0–46.0)
Hemoglobin: 13.6 g/dL (ref 12.0–15.0)
MCH: 29.2 pg (ref 26.0–34.0)
MCHC: 32.5 g/dL (ref 30.0–36.0)
MCV: 89.7 fL (ref 80.0–100.0)
Platelets: 235 10*3/uL (ref 150–400)
RBC: 4.66 MIL/uL (ref 3.87–5.11)
RDW: 14.2 % (ref 11.5–15.5)
WBC: 7.2 10*3/uL (ref 4.0–10.5)
nRBC: 0 % (ref 0.0–0.2)

## 2022-03-27 LAB — BASIC METABOLIC PANEL
Anion gap: 7 (ref 5–15)
BUN: 25 mg/dL — ABNORMAL HIGH (ref 6–20)
CO2: 25 mmol/L (ref 22–32)
Calcium: 11.2 mg/dL — ABNORMAL HIGH (ref 8.9–10.3)
Chloride: 108 mmol/L (ref 98–111)
Creatinine, Ser: 0.96 mg/dL (ref 0.44–1.00)
GFR, Estimated: >60 mL/min (ref >60)
Glucose, Bld: 121 mg/dL — ABNORMAL HIGH (ref 70–99)
Potassium: 4.4 mmol/L (ref 3.5–5.1)
Sodium: 140 mmol/L (ref 135–145)

## 2022-03-27 LAB — MAGNESIUM: Magnesium: 2.2 mg/dL (ref 1.7–2.4)

## 2022-03-27 NOTE — Progress Notes (Addendum)
Pulmonary Critical Care Medicine ?[facility] [place] ?  ?PULMONARY CRITICAL CARE SERVICE ? ?PROGRESS NOTE ? ? ? ? ?Sarah Myers  ?NTI:[id number]  ?DOB: 1971/03/24  ? ?DOA: 03/22/2022 ? ?Referring Physician: Luna Kitchens, MD ? ?HPI: Sarah Myers is a 51 y.o. female being followed for ventilator/airway/oxygen weaning Acute on Chronic Respiratory Failure.  Patient seen sitting up in bed this morning, daughter at bedside.  In's capped and on room air.  Minimal secretions, does have a good cough and able to clear secretions on her own.  Mild anxiety still present.  No acute distress, no acute overnight events. ? ?Medications: ?Reviewed on Rounds ? ?Physical Exam: ? ?Vitals: Temp 97.4, pulse 81, respirations 16, BP 166/86, SPO2 100% ? ?Ventilator Settings capped and on room air ? ?General: Comfortable at this time ?Neck: supple ?Cardiovascular: no malignant arrhythmias ?Respiratory: Bilaterally clear and diminished ?Skin: no rash seen on limited exam ?Musculoskeletal: No gross abnormality ?Psychiatric:unable to assess ?Neurologic:no involuntary movements   ?   ?   ?Lab Data:  ? ?Basic Metabolic Panel: ?Recent Labs  ?Lab 03/21/22 ?0430 03/22/22 ?8676 03/27/22 ?0456  ?NA 138 137 140  ?K 3.7 3.7 4.4  ?CL 107 104 108  ?CO2 20* 25 25  ?GLUCOSE 151* 96 121*  ?BUN 22* 23* 25*  ?CREATININE 0.93 0.95 0.96  ?CALCIUM 10.3 10.6* 11.2*  ?MG  --   --  2.2  ? ? ?ABG: ?No results for input(s): PHART, PCO2ART, PO2ART, HCO3, O2SAT in the last 168 hours. ? ?Liver Function Tests: ?No results for input(s): AST, ALT, ALKPHOS, BILITOT, PROT, ALBUMIN in the last 168 hours. ?No results for input(s): LIPASE, AMYLASE in the last 168 hours. ?No results for input(s): AMMONIA in the last 168 hours. ? ?CBC: ?Recent Labs  ?Lab 03/21/22 ?0430 03/22/22 ?1950 03/27/22 ?0456  ?WBC 6.8 6.6 7.2  ?HGB 11.7* 11.5* 13.6  ?HCT 36.4 35.8* 41.8  ?MCV 92.6 90.9 89.7  ?PLT 139* 159 235  ? ? ?Cardiac Enzymes: ?No results for input(s): CKTOTAL,  CKMB, CKMBINDEX, TROPONINI in the last 168 hours. ? ?BNP (last 3 results) ?No results for input(s): BNP in the last 8760 hours. ? ?ProBNP (last 3 results) ?No results for input(s): PROBNP in the last 8760 hours. ? ?Radiological Exams: ?No results found. ? ?Assessment/Plan ?Active Problems: ?  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  Acute on chronic respiratory failure with hypoxia (HCC) ?  Bilateral pulmonary embolism (HCC) ?  Tracheostomy dependent (HCC) ?  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 ?Acute on chronic respiratory failure with hypoxia-patient has been capped since 1045 April 6th and continues tolerating well.  Patient meets all criteria for decannulation.  I have placed an order for decannulation this morning.  Continue as needed DuoNebs. ?Nontraumatic intracranial hemorrhage-continue with supportive care, continue with physical therapy. ?Dysphagia-secondary to stroke, patient has been evaluated and is currently eating with recommendations from speech.   ?Bilateral pulmonary emboli-has been anticoagulated, cleared for anticoagulation, on Eliquis. ?Tracheostomy-remains in place, patient meets all criteria for decannulation.  Order for decannulation placed this morning. ? ? ?I have personally seen and evaluated the patient, evaluated laboratory and imaging results, formulated the assessment and plan and placed orders. ?The Patient requires high complexity decision making with multiple systems involvement.  ?Rounds were done with the Respiratory Therapy Director and Staff therapists and discussed with nursing staff also. ? ?Yevonne Pax, MD FCCP ?Pulmonary Critical Care Medicine ?Sleep Medicine  ?

## 2022-03-28 DIAGNOSIS — I69391 Dysphagia following cerebral infarction: Secondary | ICD-10-CM | POA: Diagnosis not present

## 2022-03-28 DIAGNOSIS — J9621 Acute and chronic respiratory failure with hypoxia: Secondary | ICD-10-CM | POA: Diagnosis not present

## 2022-03-28 DIAGNOSIS — I2699 Other pulmonary embolism without acute cor pulmonale: Secondary | ICD-10-CM | POA: Diagnosis not present

## 2022-03-28 DIAGNOSIS — I619 Nontraumatic intracerebral hemorrhage, unspecified: Secondary | ICD-10-CM | POA: Diagnosis not present

## 2022-03-28 NOTE — Progress Notes (Addendum)
Pulmonary Critical Care Medicine ?[facility] [place] ?  ?PULMONARY CRITICAL CARE SERVICE ? ?PROGRESS NOTE ? ? ? ? ?Sarah Myers  ?EVO:[id number]  ?DOB: 1971/08/02  ? ?DOA: 03/22/2022 ? ?Referring Physician: Luna Kitchens, MD ? ?HPI: Sarah Myers is a 51 y.o. female being followed for ventilator/airway/oxygen weaning Acute on Chronic Respiratory Failure.  Patient seen sitting up in bed, daughter at bedside.  Patient was decannulated yesterday.  Stoma bandage in place.  She remains on room air and tolerating well.  Acute distress, no acute overnight events. ? ?Medications: ?Reviewed on Rounds ? ?Physical Exam: ? ?Vitals: Temp 96.9, pulse 64, respirations 19, BP 156/80, SPO2 99% ? ?Ventilator Settings room air ? ?General: Comfortable at this time ?Neck: supple ?Cardiovascular: no malignant arrhythmias ?Respiratory: Bilaterally clear but diminished ?Skin: no rash seen on limited exam ?Musculoskeletal: No gross abnormality ?Psychiatric:unable to assess ?Neurologic:no involuntary movements   ?   ?   ?Lab Data:  ? ?Basic Metabolic Panel: ?Recent Labs  ?Lab 03/22/22 ?0625 03/27/22 ?0456  ?NA 137 140  ?K 3.7 4.4  ?CL 104 108  ?CO2 25 25  ?GLUCOSE 96 121*  ?BUN 23* 25*  ?CREATININE 0.95 0.96  ?CALCIUM 10.6* 11.2*  ?MG  --  2.2  ? ? ?ABG: ?No results for input(s): PHART, PCO2ART, PO2ART, HCO3, O2SAT in the last 168 hours. ? ?Liver Function Tests: ?No results for input(s): AST, ALT, ALKPHOS, BILITOT, PROT, ALBUMIN in the last 168 hours. ?No results for input(s): LIPASE, AMYLASE in the last 168 hours. ?No results for input(s): AMMONIA in the last 168 hours. ? ?CBC: ?Recent Labs  ?Lab 03/22/22 ?0625 03/27/22 ?0456  ?WBC 6.6 7.2  ?HGB 11.5* 13.6  ?HCT 35.8* 41.8  ?MCV 90.9 89.7  ?PLT 159 235  ? ? ?Cardiac Enzymes: ?No results for input(s): CKTOTAL, CKMB, CKMBINDEX, TROPONINI in the last 168 hours. ? ?BNP (last 3 results) ?No results for input(s): BNP in the last 8760 hours. ? ?ProBNP (last 3 results) ?No  results for input(s): PROBNP in the last 8760 hours. ? ?Radiological Exams: ?No results found. ? ?Assessment/Plan ?Active Problems: ?  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  Acute on chronic respiratory failure with hypoxia (HCC) ?  Bilateral pulmonary embolism (HCC) ?  Tracheostomy dependent (HCC) ?  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 ?Acute on chronic respiratory failure with hypoxia-patient was decannulated yesterday.  She remains doing well on room air.  Stoma bandage in place.  Continue as needed DuoNebs. ?Nontraumatic intracranial hemorrhage-continue with supportive care, continue with physical therapy. ?Dysphagia-secondary to stroke, patient has been evaluated and is currently eating with recommendations from speech.   ?Bilateral pulmonary emboli-has been anticoagulated, cleared for anticoagulation, on Eliquis. ?Tracheostomy-patient was decannulated yesterday.  Stoma bandage remains in place.  I did evaluate stoma and it appears closed. ? ?Patient was decannulated yesterday and continues to do well on room air.  Evaluation of the stoma this morning is consistent with closed stoma.  Bandage continues to be in place.  It is appropriate for pulmonology to sign off at this time.  We appreciate the consult.  Please reach out if further recommendation or reevaluation is needed from our services. ? ? ?I have personally seen and evaluated the patient, evaluated laboratory and imaging results, formulated the assessment and plan and placed orders. ?The Patient requires high complexity decision making with multiple systems involvement.  ?Rounds were done with the Respiratory Therapy Director and Staff therapists and discussed with nursing staff  also. ? ?Allyne Gee, MD FCCP ?Pulmonary Critical Care Medicine ?Sleep Medicine  ?

## 2022-03-29 LAB — BASIC METABOLIC PANEL
Anion gap: 7 (ref 5–15)
BUN: 23 mg/dL — ABNORMAL HIGH (ref 6–20)
CO2: 24 mmol/L (ref 22–32)
Calcium: 10.4 mg/dL — ABNORMAL HIGH (ref 8.9–10.3)
Chloride: 107 mmol/L (ref 98–111)
Creatinine, Ser: 0.8 mg/dL (ref 0.44–1.00)
GFR, Estimated: >60 mL/min (ref >60)
Glucose, Bld: 122 mg/dL — ABNORMAL HIGH (ref 70–99)
Potassium: 4.2 mmol/L (ref 3.5–5.1)
Sodium: 138 mmol/L (ref 135–145)

## 2022-03-29 LAB — CALCIUM, IONIZED: Calcium, Ionized, Serum: 5.9 mg/dL — ABNORMAL HIGH (ref 4.5–5.6)

## 2022-04-01 LAB — BASIC METABOLIC PANEL
Anion gap: 6 (ref 5–15)
BUN: 25 mg/dL — ABNORMAL HIGH (ref 6–20)
CO2: 24 mmol/L (ref 22–32)
Calcium: 10.1 mg/dL (ref 8.9–10.3)
Chloride: 108 mmol/L (ref 98–111)
Creatinine, Ser: 0.87 mg/dL (ref 0.44–1.00)
GFR, Estimated: >60 mL/min (ref >60)
Glucose, Bld: 110 mg/dL — ABNORMAL HIGH (ref 70–99)
Potassium: 3.8 mmol/L (ref 3.5–5.1)
Sodium: 138 mmol/L (ref 135–145)

## 2022-04-01 LAB — CBC
HCT: 39.4 % (ref 36.0–46.0)
Hemoglobin: 13.3 g/dL (ref 12.0–15.0)
MCH: 29.4 pg (ref 26.0–34.0)
MCHC: 33.8 g/dL (ref 30.0–36.0)
MCV: 87.2 fL (ref 80.0–100.0)
Platelets: 246 10*3/uL (ref 150–400)
RBC: 4.52 MIL/uL (ref 3.87–5.11)
RDW: 13.3 % (ref 11.5–15.5)
WBC: 6.1 10*3/uL (ref 4.0–10.5)
nRBC: 0 % (ref 0.0–0.2)

## 2022-04-03 NOTE — PMR Pre-admission (Signed)
PMR Admission Coordinator Pre-Admission Assessment ? ?Patient: Sarah Myers is an 51 y.o., female ?MRN: [id number] ?DOB: 1971-07-03 ?Height:   ?Weight: 74.1 kg ? ?Insurance Information ?HMO:     PPO:      PCP:      IPA:      80/20:      OTHER:  ?PRIMARY: [employer]      Policy#: [id number]      Subscriber: pt ?CM Name:       Phone#: [phone]     Fax#: [phone] ?Pre-Cert#: [id number]      Employer:  ?Benefits:  Phone #: [phone]     Name:  ?Eff. Date: 12/18/21     Deduct: $0      Out of Pocket Max: $1700 (met $39.15)      Life Max: n/a ?CIR: 75%      SNF: 75% ?Outpatient: 75%     Co-Ins: 25% ?Home Health: 75%      Co-Ins: 25% ?DME: 75%     Co-Ins: 25% ?Providers:  ?SECONDARY:       Policy#:      Phone#:  ?  ?Financial Counselor:       Phone#:  ?  ?The ?Data Collection Information Summary? for patients in Inpatient Rehabilitation Facilities with attached ?Privacy Act [place] Records? was provided and verbally reviewed with: N/A ?  ?Emergency Contact Information ?Contact Information   ? ?Contact Information   ? ? Name Relation Home Work Mobile  ? Kaminski,[name] Daughter   [phone]  ? Star Age Daughter [phone]  [phone]  ? Breosha,Guertin-1st contact Daughter   [phone]  ? ?  ? ? ?Current Medical History  ?Patient Admitting Diagnosis: R basal ganglia hemorrhage ?History of Present Illness:  pt is a 51 y/o female with PMH of HTN who presented to [facility] on 2/19 with dense L hemiparesis.  NIHSS 14.  Head CT showed large R lentiform ICH with extension into the R temporal lobe.  She was intubated for airway protection.  SBP up to 253 in the ED and was started on clevidipine infusion and propofol for sedation.  Repeat CT at 2.5 hours stable.  Neurosurgery was consulted and recommended conservative management.  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  She failed several extubation trials and had trach placed on 2/28.  Hospital course complicated by respiratory failure with difficulty weaning.  She was transferred to [facility] on 4/4 where she continued to wean to room air and was decannulated on 4/12.  She is tolerating a D2/thin liquid diet with supplemental tube feeds (PEG placed on 3/20).  Therapy ongoing and recommendations are for CIR. ?  ?Complete NIHSS TOTAL: 13 ?  ?Patient's medical record from [facility] and [facility] has been reviewed by the rehabilitation admission coordinator and physician. ?  ?Past Medical History  ?    ?Past Medical History:  ?Diagnosis Date  ? Hypertension    ?  ?  ?Has the patient had major surgery during 100 days prior to admission? Yes ?  ?Family History   ?family history includes Diabetes in her maternal aunt, maternal grandfather, maternal grandmother, and mother; Heart disease in her maternal grandfather, maternal grandmother, mother, paternal grandfather, and paternal grandmother; Hypertension in her mother; Stroke in her maternal uncle. ?  ?Current Medications ?No current facility-administered medications for this encounter. ? ?  Patients Current Diet: Diet D2/thin ? ?Precautions / Restrictions ?Precautions: Fall ?Precaution Comments: L inattention, PEG, trach ?Weight Bearing Restrictions: No ?  ? ?Has the patient had 2 or more falls or a fall with injury in the past year? No ? ?Prior Activity Level ?Community (5-7x/wk): independent prior to admit, driving, no DME used, caregiver for someone else ? ? ?Prior Functional Level ?Self Care: Did the patient need help bathing, dressing, using the toilet or eating? Independent ?  ?Indoor Mobility: Did the patient need assistance with walking from room to room (with or without device)? Independent ?  ?Stairs: Did the patient need assistance with internal or external stairs (with or without  device)? Independent ?  ?Functional Cognition: Did the patient need help planning regular tasks such as shopping or remembering to take medications? Independent ? ?Patient Information ?Are you of Hispanic, Latino/a,or Spanish origin?: A. No, not of Hispanic, Latino/a, or Spanish origin ?What is your race?: B. Black or African American ?Do you need or want an interpreter to communicate with a doctor or health care staff?: 0. No ? ?Patient's Response To:  ?Health Literacy and Transportation ?Is the patient able to respond to health literacy and transportation needs?: Yes ?Health Literacy - How often do you need to have someone help you when you read instructions, pamphlets, or other written material from your doctor or pharmacy?: Never ?In the past 12 months, has lack of transportation kept you from medical appointments or from getting medications?: No ?In the past 12 months, has lack of transportation kept you from meetings, work, or from getting things needed for daily living?: No ? ?Home Assistive Devices / Equipment ? none ? ?Prior Device Use: Indicate devices/aids used by the patient prior to current illness, exacerbation or injury? None of the above ? ? Prior Functional Level Current Functional Level  ?Bed Mobility ? Independent ? Mod assist ?  ?Transfers ? Independent ? Mod assist ?  ?Mobility - Walk/Wheelchair ? Independent ? -- (not tested) ?  ?Upper Body Dressing ? Independent ? Min assist ?  ?Lower Body Dressing ? Independent ? Mod assist ?  ?Grooming ? Independent ? Min assist ?  ?Eating/Drinking ? Independent ? Min assist ?  ?Toilet Transfer ? Independent ? Mod assist ?  ?Bladder Continence  ? continent ? continent ?  ?Bowel Management ? continent ? continent ?  ?Stair Climbing ? Independent ?   ?  ?Communication ? Indep ?   ?  ?Memory ? Indep ?   ?  ?  ?Special needs/care consideration ?Continuous Drip IV  jevity ?Trach size decannulated 4/12 stoma closed ? ?Previous Home Environment (from acute therapy  documentation) ? Same as discharge living environment (see below)  ? ?Discharge Living Setting ?Plans for Discharge Living Setting: Patient's home; Lives with (comment) (daughter [name] (on-drea)) ?Type of Home at Discharge: Apartment ?Discharge Home Layout: One level ?Discharge Home Access: Stairs to enter (second floor) ?Entrance Stairs-Rails: Left ?Entrance Stairs-Number of Steps: full flight ?Discharge Bathroom Shower/Tub: Tub/shower unit ?Discharge Bathroom Toilet: Standard ?Discharge Bathroom Accessibility: Yes ?How Accessible: Accessible via walker ?Does the patient have any problems obtaining your medications?: No ? ? ?Social/Family/Support Systems ?Anticipated Caregiver: daughters, Pearline Cables (PRN), [name] (24/7), and Erich Montane (PRN) ?Anticipated Caregiver's Contact Information: Pearline Cables (primary contact) [phone]; [name] [phone] ?Ability/Limitations of Caregiver: min assist for mobility and ADLs ?Caregiver Availability: 24/7 ?Discharge Plan Discussed with Primary Caregiver: Yes ?Is Caregiver In Agreement with Plan?: Yes ?Does Caregiver/Family have Issues with Lodging/Transportation while Pt is in Rehab?: No ? ? ?  Goals ?Patient/Family Goal for Rehab: PT/OT/SLP supervision to min assist ?Expected length of stay: 14-16 days ?Pt/Family Agrees to Admission and willing to participate: Yes ?Program Orientation Provided & Reviewed with Pt/Caregiver Including Roles  & Responsibilities: Yes ? Barriers to Discharge: Insurance for SNF coverage; Home environment access/layout ? ? ?Decrease burden of Care through IP rehab admission: n/a ? ?Possible need for SNF placement upon discharge: Not anticipated.  Reviewed need for 24/7 supervision/assist with family and they are in agreement.  ?  ?Patient Condition: I have reviewed medical records from [facility] and [facility], spoken with CM, and patient and daughter. I met with patient at the bedside and discussed via phone for inpatient rehabilitation assessment.  Patient  will benefit from ongoing PT, OT, and SLP, can actively participate in 3 hours of therapy a day 5 days of the week, and can make measurable gains during the admission.  Patient will also benefit from the coordinated te

## 2022-04-04 ENCOUNTER — Encounter (HOSPITAL_COMMUNITY): Payer: Self-pay | Admitting: Physical Medicine and Rehabilitation

## 2022-04-04 ENCOUNTER — Other Ambulatory Visit: Payer: Self-pay | Admitting: Physician Assistant

## 2022-04-04 ENCOUNTER — Inpatient Hospital Stay (HOSPITAL_COMMUNITY)
Admission: RE | Admit: 2022-04-04 | Discharge: 2022-04-26 | DRG: 057 | Disposition: A | Discharge status: Home Health | Payer: 59 | Source: Other Acute Inpatient Hospital | Attending: Physical Medicine and Rehabilitation | Admitting: Physical Medicine and Rehabilitation

## 2022-04-04 ENCOUNTER — Other Ambulatory Visit: Payer: Self-pay

## 2022-04-04 DIAGNOSIS — I619 Nontraumatic intracerebral hemorrhage, unspecified: Secondary | ICD-10-CM | POA: Diagnosis not present

## 2022-04-04 DIAGNOSIS — I69191 Dysphagia following nontraumatic intracerebral hemorrhage: Secondary | ICD-10-CM | POA: Diagnosis not present

## 2022-04-04 DIAGNOSIS — F322 Major depressive disorder, single episode, severe without psychotic features: Secondary | ICD-10-CM | POA: Diagnosis not present

## 2022-04-04 DIAGNOSIS — K5900 Constipation, unspecified: Secondary | ICD-10-CM | POA: Diagnosis present

## 2022-04-04 DIAGNOSIS — Z86711 Personal history of pulmonary embolism: Secondary | ICD-10-CM | POA: Diagnosis not present

## 2022-04-04 DIAGNOSIS — R1310 Dysphagia, unspecified: Secondary | ICD-10-CM | POA: Diagnosis present

## 2022-04-04 DIAGNOSIS — M25512 Pain in left shoulder: Secondary | ICD-10-CM | POA: Diagnosis present

## 2022-04-04 DIAGNOSIS — Z833 Family history of diabetes mellitus: Secondary | ICD-10-CM

## 2022-04-04 DIAGNOSIS — R1312 Dysphagia, oropharyngeal phase: Secondary | ICD-10-CM | POA: Diagnosis not present

## 2022-04-04 DIAGNOSIS — F419 Anxiety disorder, unspecified: Secondary | ICD-10-CM | POA: Diagnosis present

## 2022-04-04 DIAGNOSIS — Z888 Allergy status to other drugs, medicaments and biological substances status: Secondary | ICD-10-CM | POA: Diagnosis not present

## 2022-04-04 DIAGNOSIS — Z823 Family history of stroke: Secondary | ICD-10-CM | POA: Diagnosis not present

## 2022-04-04 DIAGNOSIS — I2699 Other pulmonary embolism without acute cor pulmonale: Secondary | ICD-10-CM | POA: Diagnosis not present

## 2022-04-04 DIAGNOSIS — E876 Hypokalemia: Secondary | ICD-10-CM | POA: Diagnosis present

## 2022-04-04 DIAGNOSIS — Z79899 Other long term (current) drug therapy: Secondary | ICD-10-CM | POA: Diagnosis not present

## 2022-04-04 DIAGNOSIS — Z7901 Long term (current) use of anticoagulants: Secondary | ICD-10-CM

## 2022-04-04 DIAGNOSIS — I610 Nontraumatic intracerebral hemorrhage in hemisphere, subcortical: Secondary | ICD-10-CM

## 2022-04-04 DIAGNOSIS — F329 Major depressive disorder, single episode, unspecified: Secondary | ICD-10-CM | POA: Diagnosis not present

## 2022-04-04 DIAGNOSIS — Z931 Gastrostomy status: Secondary | ICD-10-CM | POA: Diagnosis not present

## 2022-04-04 DIAGNOSIS — I10 Essential (primary) hypertension: Secondary | ICD-10-CM | POA: Diagnosis not present

## 2022-04-04 DIAGNOSIS — R001 Bradycardia, unspecified: Secondary | ICD-10-CM | POA: Diagnosis not present

## 2022-04-04 DIAGNOSIS — Z8249 Family history of ischemic heart disease and other diseases of the circulatory system: Secondary | ICD-10-CM | POA: Diagnosis not present

## 2022-04-04 DIAGNOSIS — I69154 Hemiplegia and hemiparesis following nontraumatic intracerebral hemorrhage affecting left non-dominant side: Secondary | ICD-10-CM | POA: Diagnosis present

## 2022-04-04 DIAGNOSIS — R252 Cramp and spasm: Secondary | ICD-10-CM | POA: Diagnosis present

## 2022-04-04 LAB — GLUCOSE, CAPILLARY
Glucose-Capillary: 102 mg/dL — ABNORMAL HIGH (ref 70–99)
Glucose-Capillary: 93 mg/dL (ref 70–99)

## 2022-04-04 MED ORDER — FAMOTIDINE 20 MG PO TABS
20.0000 mg | ORAL_TABLET | Freq: Two times a day (BID) | ORAL | Status: DC
  Administered 2022-04-04 – 2022-04-26 (×44): 20 mg via ORAL
  Filled 2022-04-04 (×44): qty 1

## 2022-04-04 MED ORDER — ADULT MULTIVITAMIN W/MINERALS CH
1.0000 | ORAL_TABLET | Freq: Every day | ORAL | Status: DC
  Administered 2022-04-05 – 2022-04-26 (×22): 1 via ORAL
  Filled 2022-04-04 (×22): qty 1

## 2022-04-04 MED ORDER — LIDOCAINE 5 % EX PTCH
1.0000 | MEDICATED_PATCH | CUTANEOUS | Status: DC
  Administered 2022-04-05 – 2022-04-23 (×17): 1 via TRANSDERMAL
  Filled 2022-04-04 (×19): qty 1

## 2022-04-04 MED ORDER — FLEET ENEMA 7-19 GM/118ML RE ENEM: 1.0000 | ENEMA | Freq: Once | RECTAL | Status: DC | PRN

## 2022-04-04 MED ORDER — CARVEDILOL 25 MG PO TABS
25.0000 mg | ORAL_TABLET | Freq: Two times a day (BID) | ORAL | Status: DC
Start: 2022-04-04 — End: 2022-04-21
  Administered 2022-04-04 – 2022-04-21 (×34): 25 mg via ORAL
  Filled 2022-04-04 (×34): qty 1

## 2022-04-04 MED ORDER — INSULIN ASPART 100 UNIT/ML IJ SOLN
0.0000 [IU] | Freq: Three times a day (TID) | INTRAMUSCULAR | Status: DC
  Administered 2022-04-08 – 2022-04-26 (×11): 1 [IU] via SUBCUTANEOUS

## 2022-04-04 MED ORDER — BACLOFEN 5 MG HALF TABLET
5.0000 mg | ORAL_TABLET | Freq: Three times a day (TID) | ORAL | Status: DC
  Administered 2022-04-04 – 2022-04-11 (×20): 5 mg via ORAL
  Filled 2022-04-04 (×20): qty 1

## 2022-04-04 MED ORDER — POLYETHYLENE GLYCOL 3350 17 G PO PACK
17.0000 g | PACK | Freq: Every day | ORAL | Status: DC | PRN
  Administered 2022-04-23: 17 g via ORAL
  Filled 2022-04-04: qty 1

## 2022-04-04 MED ORDER — SERTRALINE HCL 50 MG PO TABS
50.0000 mg | ORAL_TABLET | Freq: Every day | ORAL | Status: DC
Start: 2022-04-05 — End: 2022-04-14
  Administered 2022-04-05 – 2022-04-14 (×10): 50 mg via ORAL
  Filled 2022-04-04 (×10): qty 1

## 2022-04-04 MED ORDER — ACETAMINOPHEN 325 MG PO TABS
325.0000 mg | ORAL_TABLET | ORAL | Status: DC | PRN
  Administered 2022-04-04 – 2022-04-25 (×4): 650 mg via ORAL
  Filled 2022-04-04 (×6): qty 2

## 2022-04-04 MED ORDER — PNEUMOCOCCAL 20-VAL CONJ VACC 0.5 ML IM SUSY
0.5000 mL | PREFILLED_SYRINGE | INTRAMUSCULAR | Status: DC
  Filled 2022-04-04: qty 0.5

## 2022-04-04 MED ORDER — ONDANSETRON HCL 4 MG/2ML IJ SOLN: 4.0000 mg | Freq: Four times a day (QID) | INTRAMUSCULAR | Status: DC | PRN

## 2022-04-04 MED ORDER — AMLODIPINE BESYLATE 10 MG PO TABS
10.0000 mg | ORAL_TABLET | Freq: Every day | ORAL | Status: DC
  Administered 2022-04-05 – 2022-04-26 (×22): 10 mg via ORAL
  Filled 2022-04-04 (×22): qty 1

## 2022-04-04 MED ORDER — ENSURE ENLIVE PO LIQD
1.0000 | Freq: Every day | ORAL | Status: DC
  Administered 2022-04-04 – 2022-04-12 (×4): 237 mL via ORAL

## 2022-04-04 MED ORDER — PROSOURCE PLUS PO LIQD
30.0000 mL | Freq: Two times a day (BID) | ORAL | Status: DC
  Administered 2022-04-05 – 2022-04-07 (×6): 30 mL via ORAL
  Filled 2022-04-04 (×9): qty 30

## 2022-04-04 MED ORDER — DIPHENHYDRAMINE HCL 12.5 MG/5ML PO ELIX: 12.5000 mg | ORAL_SOLUTION | Freq: Four times a day (QID) | ORAL | Status: DC | PRN

## 2022-04-04 MED ORDER — PNEUMOCOCCAL VAC POLYVALENT 25 MCG/0.5ML IJ INJ: 0.5000 mL | INJECTION | INTRAMUSCULAR | Status: DC

## 2022-04-04 MED ORDER — HYDRALAZINE HCL 50 MG PO TABS
50.0000 mg | ORAL_TABLET | Freq: Four times a day (QID) | ORAL | Status: DC
  Administered 2022-04-04 – 2022-04-26 (×87): 50 mg via ORAL
  Filled 2022-04-04 (×87): qty 1

## 2022-04-04 MED ORDER — APIXABAN 5 MG PO TABS
5.0000 mg | ORAL_TABLET | Freq: Two times a day (BID) | ORAL | Status: DC
  Administered 2022-04-04 – 2022-04-26 (×44): 5 mg via ORAL
  Filled 2022-04-04 (×44): qty 1

## 2022-04-04 MED ORDER — CLONAZEPAM 0.5 MG PO TABS
0.5000 mg | ORAL_TABLET | Freq: Every day | ORAL | Status: DC
Start: 2022-04-04 — End: 2022-04-25
  Administered 2022-04-04 – 2022-04-24 (×21): 0.5 mg via ORAL
  Filled 2022-04-04 (×21): qty 1

## 2022-04-04 MED ORDER — ONDANSETRON HCL 4 MG PO TABS
4.0000 mg | ORAL_TABLET | Freq: Four times a day (QID) | ORAL | Status: DC | PRN
  Filled 2022-04-04: qty 1

## 2022-04-04 MED ORDER — AMANTADINE HCL 100 MG PO CAPS
100.0000 mg | ORAL_CAPSULE | Freq: Every day | ORAL | Status: DC
  Administered 2022-04-05: 100 mg via ORAL
  Filled 2022-04-04 (×2): qty 1

## 2022-04-04 MED ORDER — SORBITOL 70 % SOLN
30.0000 mL | Freq: Every day | Status: DC | PRN
  Administered 2022-04-15: 30 mL via ORAL
  Filled 2022-04-04: qty 30

## 2022-04-04 MED ORDER — GABAPENTIN 100 MG PO CAPS
100.0000 mg | ORAL_CAPSULE | Freq: Two times a day (BID) | ORAL | Status: DC
  Administered 2022-04-04 – 2022-04-26 (×44): 100 mg via ORAL
  Filled 2022-04-04 (×44): qty 1

## 2022-04-04 MED ORDER — METHOCARBAMOL 500 MG PO TABS: 500.0000 mg | ORAL_TABLET | Freq: Three times a day (TID) | ORAL | Status: DC

## 2022-04-04 MED ORDER — TRAZODONE HCL 50 MG PO TABS: 25.0000 mg | ORAL_TABLET | Freq: Every evening | ORAL | Status: DC | PRN

## 2022-04-04 MED ORDER — ALUM & MAG HYDROXIDE-SIMETH 200-200-20 MG/5ML PO SUSP: 30.0000 mL | ORAL | Status: DC | PRN

## 2022-04-04 MED ORDER — BLOOD PRESSURE CONTROL BOOK
Freq: Once | Status: AC
  Filled 2022-04-04: qty 1

## 2022-04-04 NOTE — H&P (Signed)
? ? ?Physical Medicine and Rehabilitation Admission H&P ? ?  ?CC: Functional deficits secondary to basal ganglia hemorrhage ? ?HPI: Sarah Myers is a left-handed 51 year old female who presented to the emergency department on 02/05/2022 with left-sided weakness and slurred speech and hypertensive emergency. 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 Required hypertonic saline, Cleviprex, intubation. Ultimately required tracheostomy 2/28. Off ventilator on 3/4 and now decannulated. PEG placed on 3/20. Treated for MSSA pneumonia. Bilateral PE on CTA 3/7 on Eliquis. Discharged to [facility] on 03/21/2022. Off tube feeds with intermittent nausea on dysphagia 2 diet, thin liquids. The patient requires inpatient medicine and rehabilitation evaluations and services for ongoing dysfunction secondary to hemorrhagic stroke. ? ?Daughter [name] at bedside.  ?No tobacco or alcohol use. ? ?Pt reports head tilted to Left, but doesn't realize it- "Can fix it".  ?Has muscle tightness on L side- it's painful- esp L shoulder- aching.  ?Has anxiety laying flat in bed.  ?LBM today- peeing OK.  ?Doesn't like to use bedpan.  ? ? ? ?Review of Systems  ?Constitutional:  Negative for chills and fever.  ?HENT:  Negative for hearing loss and sore throat.   ?Eyes:  Negative for blurred vision and double vision.  ?Respiratory:  Negative for cough and sputum production.   ?Cardiovascular:  Negative for chest pain and palpitations.  ?Gastrointestinal:  Positive for nausea. Negative for vomiting.  ?Genitourinary:  Negative for dysuria and urgency.  ?Musculoskeletal:  Positive for joint pain.  ?     Left shoulder pain  ?Skin:   ?     1.5 cm crust to dorsal aspect of distal left forearm. No signs of infection.  ?Neurological:  Positive for focal weakness.  ?All other systems reviewed and are negative. ?Past Medical History:  ?Diagnosis Date  ? Hypertension   ? ?Past Surgical History:  ?Procedure Laterality  Date  ? BREAST LUMPECTOMY  1992  ? CESAREAN SECTION    ? IR GASTROSTOMY TUBE MOD SED  03/06/2022  ? ORIF RADIAL FRACTURE  02/10/2012  ? Procedure: OPEN REDUCTION INTERNAL FIXATION (ORIF) RADIAL FRACTURE;  Surgeon: Cammy Copa, MD;  Location: [facility] OR;  Service: Orthopedics;  Laterality: Right;  ? ORIF ULNAR FRACTURE  02/10/2012  ? Procedure: OPEN REDUCTION INTERNAL FIXATION (ORIF) ULNAR FRACTURE;  Surgeon: Cammy Copa, MD;  Location: [facility] OR;  Service: Orthopedics;  Laterality: Right;  ? TUBAL LIGATION    ? ?Family History  ?Problem Relation Age of Onset  ? Diabetes Mother   ? Hypertension Mother   ? Heart disease Mother   ? Diabetes Maternal Aunt   ? Stroke Maternal Uncle   ? Diabetes Maternal Grandmother   ? Heart disease Maternal Grandmother   ? Diabetes Maternal Grandfather   ? Heart disease Maternal Grandfather   ? Heart disease Paternal Grandmother   ? Heart disease Paternal Grandfather   ? ?Social History:  reports that she has never smoked. She has never used smokeless tobacco. She reports that she does not currently use alcohol. She reports that she does not use drugs. ?Allergies:  ?Allergies  ?Allergen Reactions  ? Lisinopril Cough  ? ?(Not in a hospital admission) ? ? ? ? ?Home: ?  ?  ?Functional History: ?  ? ?Functional Status:  ?Mobility: ?  ?  ?  ?  ? ?ADL: ?  ? ?Cognition: ?  ?  ? ?Physical Exam: ?There were no vitals taken for this visit. ?Physical Exam ?Vitals and nursing note reviewed.  Exam conducted with a chaperone present.  ?Constitutional:   ?   Comments: Head strongly tilted to left- to shoulder, but can self correct- lasts ~30 seconds- daughter at bedside; sitting up in bedside chair, NAD  ?HENT:  ?   Head: Normocephalic and atraumatic.  ?   Comments: Right facial droop with right lid ptosis ?Tongue coated but midline- L eye slower with EOM testing-no nystagmus ?   Right Ear: External ear normal.  ?   Left Ear: External ear normal.  ?   Nose: Nose normal. No congestion.  ?    Mouth/Throat:  ?   Mouth: Mucous membranes are dry.  ?   Pharynx: Oropharynx is clear. No oropharyngeal exudate.  ?Eyes:  ?   General:     ?   Right eye: No discharge.     ?   Left eye: No discharge.  ?   Comments: Slowed EOMs on L vs R  ?Cardiovascular:  ?   Rate and Rhythm: Normal rate and regular rhythm.  ?   Heart sounds: Normal heart sounds. No murmur heard. ?  No gallop.  ?Pulmonary:  ?   Effort: Pulmonary effort is normal.  ?   Breath sounds: Normal breath sounds.  ?Abdominal:  ?   General: Bowel sounds are normal. There is no distension.  ?   Palpations: Abdomen is soft.  ?   Tenderness: There is no abdominal tenderness.  ?Musculoskeletal:  ?   Cervical back: Neck supple. No tenderness.  ?   Comments: RUE- 4/5 or more- hard to test ?RLE 3+/5 proximally and 4-/5 distally ?LUE- 0/5 ?LLE- 2-/5 in HF and PF only ?  ?Skin: ?   General: Skin is warm and dry.  ?   Comments: Scab 1.5 cm on L forearm- otherwise skin looks OK  ?Neurological:  ?   Mental Status: She is alert and oriented to person, place, and time.  ?   Comments: Severe L inattention/neglect ?Intact to light touch in all 4 extremities ?Dysconjugate gaze, but denies double vision.  ?MAS of 2-3 in LUE_ shoulder MAS of 3; elbow 2-3 and MAS of 2 in L wrist ?MAS of 1+ to 2 in LLE ?Puffy L hand ?3 beats clonus LLE and (+) hoffman's LUE  ?Psychiatric:     ?   Mood and Affect: Mood normal.  ?   Comments: vague  ? ? ?No results found for this or any previous visit (from the past 48 hour(s)). ?No results found. ? ? ? ?There were no vitals taken for this visit. ? ?Medical Problem List and Plan: ?1. 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 -patient may  shower- cover PEG ? -ELOS/Goals: 14-16 days min A to supervision ?2.  Antithrombotics: ?-DVT/anticoagulation:  Pharmaceutical: Other (comment)Eliquis ? -antiplatelet therapy: none ?3. Pain Management: Tylenol prn, Robaxin and Neurotin scheduled, Lidoderm patch ?4. Mood: LCSW to evaluate  and provide emotional support ? --continue sertraline 50 mg daily ? -antipsychotic agents: n/a ?5. Neuropsych: This patient is capable of making decisions on her own behalf. ?6. Skin/Wound Care: Routine skin care checks ?7. Fluids/Electrolytes/Nutrition: Routine Is and Os and follow-up chemistries ?8: Left-sided weakness: ? --scheduled Baclofen for spasticity 5 mg TID ? --schedule Neurontin for hyperesthesia/pain LUE ?9: Dysphagia: dys 2 thin liquid diet ?10: Bilateral PE 02/21/2022: on Eliquis ?11: Hypertension: continue hydralazine, carvedilol, amlodipine ?12. Dispo- pt's daughter can sleep overnight ? ? ?I have personally performed a face to face diagnostic evaluation of this patient and  formulated the key components of the plan.  Additionally, I have personally reviewed laboratory data, imaging studies, as well as relevant notes and concur with the physician assistant's documentation above. ?  ?The patient's status has not changed from the original H&P.  Any changes in documentation from the acute care chart have been noted above. ?  ? ? ? ? ? ? ? ? ?Milinda Antis, PA-C ?04/04/2022  ? ?

## 2022-04-04 NOTE — Progress Notes (Signed)
PMR Admission Coordinator Pre-Admission Assessment ?  ?Patient: Sarah Myers is an 51 y.o., female ?MRN: [id number] ?DOB: [birth year] ?Height:   ?Weight: 74.1 kg ?  ?Insurance Information ?HMO:     PPO:      PCP:      IPA:      80/20:      OTHER:  ?PRIMARY: [employer]      Policy#: [id number]      Subscriber: pt ?CM Name:       Phone#: [phone]     Fax#: [phone] ?Pre-Cert#: [id number] auth via CM at [employer] with updates due to fax listed above after initial evaluations completed (4/19).     Employer:  ?Benefits:  Phone #: [phone]     Name:  ?Eff. Date: 12/18/21     Deduct: $0      Out of Pocket Max: $1700 (met $39.15)      Life Max: n/a ?CIR: 75%      SNF: 75% ?Outpatient: 75%     Co-Ins: 25% ?Home Health: 75%      Co-Ins: 25% ?DME: 75%     Co-Ins: 25% ?Providers:  ?SECONDARY:       Policy#:      Phone#:  ?  ?Financial Counselor:       Phone#:  ?  ?The ?Data Collection Information Summary? for patients in Inpatient Rehabilitation Facilities with attached ?Privacy Act [place] Records? was provided and verbally reviewed with: N/A ?  ?Emergency Contact Information ?Contact Information   ?  ?Contact Information   ?  ?  Name Relation Home Work Mobile  ?  Noffke,[name] Daughter     [phone]  ?  Star Age Daughter [phone]   [phone]  ?  Breosha,Smyser-1st contact Daughter     [phone]  ?  ?   ?  ?  ?Current Medical History  ?Patient Admitting Diagnosis: R basal ganglia hemorrhage ?History of Present Illness:  pt is a 51 y/o female with PMH of HTN who presented to [facility] on 2/19 with dense L hemiparesis.  NIHSS 14.  Head CT showed large R lentiform ICH with extension into the R temporal lobe.  She was intubated for airway protection.  SBP up to 253 in the ED and was started on clevidipine infusion and propofol for sedation.  Repeat CT at 2.5 hours stable.  Neurosurgery was consulted and recommended conservative management.  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  She failed several extubation trials and had trach placed on 2/28.  Hospital course complicated by respiratory failure with difficulty weaning.  She was transferred to [facility] on 4/4 where she continued to wean to room air and was decannulated on 4/12.  She is tolerating a D2/thin liquid diet with supplemental tube feeds (PEG placed on 3/20).  Therapy ongoing and recommendations are for CIR. ?  ?Complete NIHSS TOTAL: 13 ?  ?Patient's medical record from [facility] and [place] has been reviewed by the rehabilitation admission coordinator and physician. ?  ?Past Medical History  ?       ?Past Medical History:  ?Diagnosis Date  ? Hypertension    ?  ?  ?Has the patient had major surgery during 100 days prior to admission? Yes ?  ?Family History   ?family history includes Diabetes in her maternal aunt, maternal grandfather,  maternal grandmother, and mother; Heart disease in her maternal grandfather, maternal grandmother, mother, paternal grandfather, and paternal grandmother; Hypertension in her mother; Stroke in her maternal uncle. ?  ?Current Medications ?No current facility-administered medications for this encounter. ?  ?Patients Current Diet: Diet D2/thin ?  ?Precautions / Restrictions ?Precautions: Fall ?Precaution Comments: L inattention, PEG, trach ?Weight Bearing Restrictions: No ?  ?  ?Has the patient had 2 or more falls or a fall with injury in the past year? No ?  ?Prior Activity Level ?Community (5-7x/wk): independent prior to admit, driving, no DME used, caregiver for someone else ?  ?  ?Prior Functional Level ?Self Care: Did the patient need help bathing, dressing, using the toilet or eating? Independent ?  ?Indoor Mobility: Did the patient need assistance with walking from room to room (with  or without device)? Independent ?  ?Stairs: Did the patient need assistance with internal or external stairs (with or without device)? Independent ?  ?Functional Cognition: Did the patient need help planning regular tasks such as shopping or remembering to take medications? Independent ?  ?Patient Information ?Are you of Hispanic, Latino/a,or Spanish origin?: A. No, not of Hispanic, Latino/a, or Spanish origin ?What is your race?: B. Black or African American ?Do you need or want an interpreter to communicate with a doctor or health care staff?: 0. No ?  ?Patient's Response To:  ?Health Literacy and Transportation ?Is the patient able to respond to health literacy and transportation needs?: Yes ?Health Literacy - How often do you need to have someone help you when you read instructions, pamphlets, or other written material from your doctor or pharmacy?: Never ?In the past 12 months, has lack of transportation kept you from medical appointments or from getting medications?: No ?In the past 12 months, has lack of transportation kept you from meetings, work, or from getting things needed for daily living?: No ?  ?Home Assistive Devices / Equipment ? none ?  ?Prior Device Use: Indicate devices/aids used by the patient prior to current illness, exacerbation or injury? None of the above ?  ?  Prior Functional Level Current Functional Level  ?Bed Mobility ?  Independent ?  Mod assist ?   ?Transfers ?  Independent ?  Mod assist ?   ?Mobility - Walk/Wheelchair ?  Independent ?  -- (not tested) ?   ?Upper Body Dressing ?  Independent ?  Min assist ?   ?Lower Body Dressing ?  Independent ?  Mod assist ?   ?Grooming ?  Independent ?  Min assist ?   ?Eating/Drinking ?  Independent ?  Min assist ?   ?Toilet Transfer ?  Independent ?  Mod assist ?   ?Bladder Continence  ?  continent ?  continent ?   ?Bowel Management ?  continent ?  continent ?   ?Stair Climbing ?  Independent ?     ?Communication ?  Indep ?     ?Memory ?  Indep ?      ?  ?Special needs/care consideration ?Continuous Drip IV  jevity ?Trach size decannulated 4/12 stoma closed ?  ?Previous Home Environment (from acute therapy documentation) ? Same as discharge living environment (see below)  ?  ?Discharge Living Setting ?Plans for Discharge Living Setting: Patient's home; Lives with (comment) (daughter [name] (on-drea)) ?Type of Home at Discharge: Apartment ?Discharge Home Layout: One level ?Discharge Home Access: Stairs to enter (second floor) ?Entrance Stairs-Rails: Left ?Entrance Stairs-Number of Steps: full flight ?Discharge Bathroom Shower/Tub: Tub/shower  unit ?Discharge Bathroom Toilet: Standard ?Discharge Bathroom Accessibility: Yes ?How Accessible: Accessible via walker ?Does the patient have any problems obtaining your medications?: No ?  ?  ?Social/Family/Support Systems ?Anticipated Caregiver: daughters, Pearline Cables (PRN), [name] (24/7), and Erich Montane (PRN) ?Anticipated Caregiver's Contact Information: Pearline Cables (primary contact) [phone]; [name] [phone] ?Ability/Limitations of Caregiver: min assist for mobility and ADLs ?Caregiver Availability: 24/7 ?Discharge Plan Discussed with Primary Caregiver: Yes ?Is Caregiver In Agreement with Plan?: Yes ?Does Caregiver/Family have Issues with Lodging/Transportation while Pt is in Rehab?: No ?  ?  ?Goals ?Patient/Family Goal for Rehab: PT/OT/SLP supervision to min assist ?Expected length of stay: 14-16 days ?Pt/Family Agrees to Admission and willing to participate: Yes ?Program Orientation Provided & Reviewed with Pt/Caregiver Including Roles  & Responsibilities: Yes ? Barriers to Discharge: Insurance for SNF coverage; Home environment access/layout ?  ?  ?Decrease burden of Care through IP rehab admission: n/a ?  ?Possible need for SNF placement upon discharge: Not anticipated.  Reviewed need for 24/7 supervision/assist with family and they are in agreement.  ?  ?Patient Condition: I have reviewed medical records from [facility] and [facility], spoken with CM, and patient and daughter. I met with patient at the bedside and discussed via phone for inpatient rehabilitation assessment.  Patient will benefit from ongoing PT, OT,

## 2022-04-04 NOTE — Progress Notes (Signed)
Inpatient Rehabilitation Admission Medication Review by a Pharmacist ? ?A complete drug regimen review was completed for this patient to identify any potential clinically significant medication issues. ? ?High Risk Drug Classes Is patient taking? Indication by Medication  ?Antipsychotic No   ?Anticoagulant Yes Apixaban - PE  ?Antibiotic No   ?Opioid No   ?Antiplatelet No   ?Hypoglycemics/insulin Yes SSI - DM  ?Vasoactive Medication Yes Coreg/amlodipine/hydralazine - HTN  ?Chemotherapy No   ?Other Yes Gabapentin - neuropathy ?Amantadine - alertness ?Klonopin - anxiety ?Zoloft - depression  ? ? ? ?Type of Medication Issue Identified Description of Issue Recommendation(s)  ?Drug Interaction(s) (clinically significant) ?    ?Duplicate Therapy ?    ?Allergy ?    ?No Medication Administration End Date ?    ?Incorrect Dose ?    ?Additional Drug Therapy Needed ?    ?Significant med changes from prior encounter (inform family/care partners about these prior to discharge).    ?Other ?    ? ? ?Clinically significant medication issues were identified that warrant physician communication and completion of prescribed/recommended actions by midnight of the next day:  No ? ?Name of provider notified for urgent issues identified:  ? ?Provider Method of Notification:  ? ? ? ?Pharmacist comments:  ? ?Time spent performing this drug regimen review (minutes):  20 ? ? ?Ulyses Southward, PharmD, BCIDP, AAHIVP, CPP ?Infectious Disease Pharmacist ?04/04/2022 4:47 PM ? ? ?

## 2022-04-04 NOTE — H&P (Signed)
?  ?Physical Medicine and Rehabilitation Admission H&P ?  ?  ?CC: Functional deficits secondary to basal ganglia hemorrhage ?  ?HPI: Sarah Myers is a left-handed 51 year old female who presented to the emergency department on 02/05/2022 with left-sided weakness and slurred speech and hypertensive emergency. 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 Required hypertonic saline, Cleviprex, intubation. Ultimately required tracheostomy 2/28. Off ventilator on 3/4 and now decannulated. PEG placed on 3/20. Treated for MSSA pneumonia. Bilateral PE on CTA 3/7 on Eliquis. Discharged to [facility] on 03/21/2022. Off tube feeds with intermittent nausea on dysphagia 2 diet, thin liquids. The patient requires inpatient medicine and rehabilitation evaluations and services for ongoing dysfunction secondary to hemorrhagic stroke. ?  ?Daughter [name] at bedside.  ?No tobacco or alcohol use. ?  ?Pt reports head tilted to Left, but doesn't realize it- "Can fix it".  ?Has muscle tightness on L side- it's painful- esp L shoulder- aching.  ?Has anxiety laying flat in bed.  ?LBM today- peeing OK.  ?Doesn't like to use bedpan.  ?  ?  ?  ?Review of Systems  ?Constitutional:  Negative for chills and fever.  ?HENT:  Negative for hearing loss and sore throat.   ?Eyes:  Negative for blurred vision and double vision.  ?Respiratory:  Negative for cough and sputum production.   ?Cardiovascular:  Negative for chest pain and palpitations.  ?Gastrointestinal:  Positive for nausea. Negative for vomiting.  ?Genitourinary:  Negative for dysuria and urgency.  ?Musculoskeletal:  Positive for joint pain.  ?     Left shoulder pain  ?Skin:   ?     1.5 cm crust to dorsal aspect of distal left forearm. No signs of infection.  ?Neurological:  Positive for focal weakness.  ?All other systems reviewed and are negative. ?    ?Past Medical History:  ?Diagnosis Date  ? Hypertension    ?  ?     ?Past Surgical History:   ?Procedure Laterality Date  ? BREAST LUMPECTOMY   1992  ? CESAREAN SECTION      ? IR GASTROSTOMY TUBE MOD SED   03/06/2022  ? ORIF RADIAL FRACTURE   02/10/2012  ?  Procedure: OPEN REDUCTION INTERNAL FIXATION (ORIF) RADIAL FRACTURE;  Surgeon: Cammy Copa, MD;  Location: [facility] OR;  Service: Orthopedics;  Laterality: Right;  ? ORIF ULNAR FRACTURE   02/10/2012  ?  Procedure: OPEN REDUCTION INTERNAL FIXATION (ORIF) ULNAR FRACTURE;  Surgeon: Cammy Copa, MD;  Location: [facility] OR;  Service: Orthopedics;  Laterality: Right;  ? TUBAL LIGATION      ?  ?     ?Family History  ?Problem Relation Age of Onset  ? Diabetes Mother    ? Hypertension Mother    ? Heart disease Mother    ? Diabetes Maternal Aunt    ? Stroke Maternal Uncle    ? Diabetes Maternal Grandmother    ? Heart disease Maternal Grandmother    ? Diabetes Maternal Grandfather    ? Heart disease Maternal Grandfather    ? Heart disease Paternal Grandmother    ? Heart disease Paternal Grandfather    ?  ?Social History:  reports that she has never smoked. She has never used smokeless tobacco. She reports that she does not currently use alcohol. She reports that she does not use drugs. ?Allergies:  ?    ?Allergies  ?Allergen Reactions  ? Lisinopril Cough  ?  ?(Not in a hospital admission) ?  ?  ?  ?  ?  Home: ?  ?Functional History: ?  ?Functional Status:  ?Mobility: ?  ?ADL: ?  ?Cognition: ?  ?Physical Exam: ?There were no vitals taken for this visit. ?Physical Exam ?Vitals and nursing note reviewed. Exam conducted with a chaperone present.  ?Constitutional:   ?   Comments: Head strongly tilted to left- to shoulder, but can self correct- lasts ~30 seconds- daughter at bedside; sitting up in bedside chair, NAD  ?HENT:  ?   Head: Normocephalic and atraumatic.  ?   Comments: Right facial droop with right lid ptosis ?Tongue coated but midline- L eye slower with EOM testing-no nystagmus ?   Right Ear: External ear normal.  ?   Left Ear: External ear normal.  ?   Nose: Nose  normal. No congestion.  ?   Mouth/Throat:  ?   Mouth: Mucous membranes are dry.  ?   Pharynx: Oropharynx is clear. No oropharyngeal exudate.  ?Eyes:  ?   General:     ?   Right eye: No discharge.     ?   Left eye: No discharge.  ?   Comments: Slowed EOMs on L vs R  ?Cardiovascular:  ?   Rate and Rhythm: Normal rate and regular rhythm.  ?   Heart sounds: Normal heart sounds. No murmur heard. ?  No gallop.  ?Pulmonary:  ?   Effort: Pulmonary effort is normal.  ?   Breath sounds: Normal breath sounds.  ?Abdominal:  ?   General: Bowel sounds are normal. There is no distension.  ?   Palpations: Abdomen is soft.  ?   Tenderness: There is no abdominal tenderness.  ?Musculoskeletal:  ?   Cervical back: Neck supple. No tenderness.  ?   Comments: RUE- 4/5 or more- hard to test ?RLE 3+/5 proximally and 4-/5 distally ?LUE- 0/5 ?LLE- 2-/5 in HF and PF only ?  ?Skin: ?   General: Skin is warm and dry.  ?   Comments: Scab 1.5 cm on L forearm- otherwise skin looks OK  ?Neurological:  ?   Mental Status: She is alert and oriented to person, place, and time.  ?   Comments: Severe L inattention/neglect ?Intact to light touch in all 4 extremities ?Dysconjugate gaze, but denies double vision.  ?MAS of 2-3 in LUE_ shoulder MAS of 3; elbow 2-3 and MAS of 2 in L wrist ?MAS of 1+ to 2 in LLE ?Puffy L hand ?3 beats clonus LLE and (+) hoffman's LUE  ?Psychiatric:     ?   Mood and Affect: Mood normal.  ?   Comments: vague  ?  ?  ?Lab Results Last 48 Hours  ?No results found for this or any previous visit (from the past 48 hour(s)).  ? ?Imaging Results (Last 48 hours)  ?No results found.  ? ?  ?  ?  ?There were no vitals taken for this visit. ?  ?Medical Problem List and Plan: ?1. 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            -patient may  shower- cover PEG ?            -ELOS/Goals: 14-16 days min A to supervision ?2.  Antithrombotics: ?-DVT/anticoagulation:  Pharmaceutical: Other (comment)Eliquis ?             -antiplatelet therapy: none ?3. Pain Management: Tylenol prn, Robaxin and Neurotin scheduled, Lidoderm patch ?4. Mood: LCSW to evaluate and provide emotional support ?            --  continue sertraline 50 mg daily ?            -antipsychotic agents: n/a ?5. Neuropsych: This patient is capable of making decisions on her own behalf. ?6. Skin/Wound Care: Routine skin care checks ?7. Fluids/Electrolytes/Nutrition: Routine Is and Os and follow-up chemistries ?8: Left-sided weakness: ?            --scheduled Baclofen for spasticity 5 mg TID ?            --schedule Neurontin for hyperesthesia/pain LUE ?9: Dysphagia: dys 2 thin liquid diet ?10: Bilateral PE 02/21/2022: on Eliquis ?11: Hypertension: continue hydralazine, carvedilol, amlodipine ?12. Dispo- pt's daughter can sleep overnight ?  ?  ?I have personally performed a face to face diagnostic evaluation of this patient and formulated the key components of the plan.  Additionally, I have personally reviewed laboratory data, imaging studies, as well as relevant notes and concur with the physician assistant's documentation above. ?  ?The patient's status has not changed from the original H&P.  Any changes in documentation from the acute care chart have been noted above. ?  ?  ?  ?  ?  ?  ?  ?  ?  ?Milinda Antis, PA-C ?04/04/2022  ?  ? ?

## 2022-04-04 NOTE — Progress Notes (Signed)
INPATIENT REHABILITATION ADMISSION NOTE ? ? ?Arrival Method: ?bed ?   ?Mental Orientation:alert ? ? ?Assessment:done ? ? ?Skin:done Jarrett Soho RN/ Marjorie Smolder rn ? ? ?IV'S:NONE ? ? ?Pain: NONE ? ? ?Tubes and Drains:peg tube ? ? ?Safety Measures:done ? ? ?Vital Signs:done ? ? ?Height and Weight:done ? ? ?Rehab Orientation:done ? ? ?Family: ?Daughter ; [name] ? ? ?Notes:  ? ?Tanishka Drolet RNC,BSN, WTA  ?

## 2022-04-05 DIAGNOSIS — I619 Nontraumatic intracerebral hemorrhage, unspecified: Secondary | ICD-10-CM

## 2022-04-05 LAB — CBC WITH DIFFERENTIAL/PLATELET
Abs Immature Granulocytes: 0.02 10*3/uL (ref 0.00–0.07)
Basophils Absolute: 0 10*3/uL (ref 0.0–0.1)
Basophils Relative: 1 %
Eosinophils Absolute: 0.1 10*3/uL (ref 0.0–0.5)
Eosinophils Relative: 1 %
HCT: 39.1 % (ref 36.0–46.0)
Hemoglobin: 13.1 g/dL (ref 12.0–15.0)
Immature Granulocytes: 0 %
Lymphocytes Relative: 29 %
Lymphs Abs: 1.8 10*3/uL (ref 0.7–4.0)
MCH: 28.9 pg (ref 26.0–34.0)
MCHC: 33.5 g/dL (ref 30.0–36.0)
MCV: 86.1 fL (ref 80.0–100.0)
Monocytes Absolute: 0.4 10*3/uL (ref 0.1–1.0)
Monocytes Relative: 6 %
Neutro Abs: 3.9 10*3/uL (ref 1.7–7.7)
Neutrophils Relative %: 63 %
Platelets: 222 10*3/uL (ref 150–400)
RBC: 4.54 MIL/uL (ref 3.87–5.11)
RDW: 13.5 % (ref 11.5–15.5)
WBC: 6.1 10*3/uL (ref 4.0–10.5)
nRBC: 0 % (ref 0.0–0.2)

## 2022-04-05 LAB — GLUCOSE, CAPILLARY
Glucose-Capillary: 116 mg/dL — ABNORMAL HIGH (ref 70–99)
Glucose-Capillary: 96 mg/dL (ref 70–99)
Glucose-Capillary: 98 mg/dL (ref 70–99)

## 2022-04-05 LAB — COMPREHENSIVE METABOLIC PANEL
ALT: 33 U/L (ref 0–44)
AST: 16 U/L (ref 15–41)
Albumin: 3.4 g/dL — ABNORMAL LOW (ref 3.5–5.0)
Alkaline Phosphatase: 64 U/L (ref 38–126)
Anion gap: 9 (ref 5–15)
BUN: 14 mg/dL (ref 6–20)
CO2: 23 mmol/L (ref 22–32)
Calcium: 10 mg/dL (ref 8.9–10.3)
Chloride: 106 mmol/L (ref 98–111)
Creatinine, Ser: 0.87 mg/dL (ref 0.44–1.00)
GFR, Estimated: >60 mL/min (ref >60)
Glucose, Bld: 107 mg/dL — ABNORMAL HIGH (ref 70–99)
Potassium: 3.3 mmol/L — ABNORMAL LOW (ref 3.5–5.1)
Sodium: 138 mmol/L (ref 135–145)
Total Bilirubin: 0.5 mg/dL (ref 0.3–1.2)
Total Protein: 6.6 g/dL (ref 6.5–8.1)

## 2022-04-05 MED ORDER — DICLOFENAC SODIUM 1 % EX GEL
2.0000 g | Freq: Four times a day (QID) | CUTANEOUS | Status: DC | PRN
  Filled 2022-04-05: qty 100

## 2022-04-05 MED ORDER — MEDIHONEY WOUND/BURN DRESSING EX PSTE
1.0000 "application " | PASTE | Freq: Every day | CUTANEOUS | Status: DC
  Administered 2022-04-05 – 2022-04-26 (×22): 1 via TOPICAL
  Filled 2022-04-05: qty 44

## 2022-04-05 MED ORDER — MAGNESIUM GLUCONATE 500 MG PO TABS
250.0000 mg | ORAL_TABLET | Freq: Every day | ORAL | Status: DC
  Administered 2022-04-05: 250 mg via ORAL
  Filled 2022-04-05: qty 1

## 2022-04-05 MED ORDER — MAGNESIUM HYDROXIDE 400 MG/5ML PO SUSP
30.0000 mL | Freq: Once | ORAL | Status: AC
  Administered 2022-04-05: 30 mL via ORAL
  Filled 2022-04-05: qty 30

## 2022-04-05 MED ORDER — POTASSIUM CHLORIDE 20 MEQ PO PACK
20.0000 meq | PACK | Freq: Two times a day (BID) | ORAL | Status: DC
Start: 2022-04-05 — End: 2022-04-07
  Administered 2022-04-05 – 2022-04-07 (×5): 20 meq via ORAL
  Filled 2022-04-05 (×5): qty 1

## 2022-04-05 NOTE — Evaluation (Signed)
Speech Language Pathology Assessment and Plan ? ?Patient Details  ?Name: Sarah Myers ?MRN: [id number] ?Date of Birth: 20-Jul-1971 ? ?SLP Diagnosis: Dysarthria;Dysphagia;Cognitive Impairments  ?Rehab Potential: Excellent ?ELOS: 3-4 weeks  ? ? ?Today's Date: 04/05/2022 ?SLP Individual Time: 1030-1130 ?SLP Individual Time Calculation (min): 60 min ? ? ?Hospital Problem: Principal Problem: ?  ICH (intracerebral hemorrhage) ([place]) ? ?Past Medical History:  ?Past Medical History:  ?Diagnosis Date  ? Hypertension   ? ?Past Surgical History:  ?Past Surgical History:  ?Procedure Laterality Date  ? BREAST LUMPECTOMY  1992  ? CESAREAN SECTION    ? IR GASTROSTOMY TUBE MOD SED  03/06/2022  ? ORIF RADIAL FRACTURE  02/10/2012  ? Procedure: OPEN REDUCTION INTERNAL FIXATION (ORIF) RADIAL FRACTURE;  Surgeon: Meredith Pel, MD;  Location: [place];  Service: Orthopedics;  Laterality: Right;  ? ORIF ULNAR FRACTURE  02/10/2012  ? Procedure: OPEN REDUCTION INTERNAL FIXATION (ORIF) ULNAR FRACTURE;  Surgeon: Meredith Pel, MD;  Location: [place];  Service: Orthopedics;  Laterality: Right;  ? TUBAL LIGATION    ? ? ?Assessment / Plan / Recommendation ?Clinical Impression Pt is a 51 y/o female with PMH of HTN who presented to [facility] on 2/19 with dense L hemiparesis.  NIHSS 14.  Head CT showed large R lentiform ICH with extension into the R temporal lobe.  She was intubated for airway protection. Neurosurgery was consulted and recommended conservative management.  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  She failed several extubation trials and had trach placed on 2/28.  Hospital course complicated by respiratory failure with difficulty weaning.  She was transferred to [facility] on 4/4 where she continued to wean to room air and was decannulated on 4/12.  She is  tolerating dys. 2 textures with thin liquids with supplemental tube feeds (PEG placed on 3/20).  Therapy ongoing and recommendations are for CIR. Patient admitted 04/04/21. ? ?Upon arrival, patient was awake while upright in the recliner. Patient with mild left sided facial weakness resulting in imprecise consonants. Despite decreased articulatory precision and an increased speech rate, patient was 100% intelligible at the conversation level. Patient consumed trials of thin liquids via straw. Patient with a suspected delay in swallow initiation but no overt s/s aspiration noted when patient was drinking only thin liquids. Patient with one coughing episode with mixed consistencies (yogurt). SLP provided education to patient and her daughter regarding minimizing mixed consistencies. Suspect swallow function was also impacted by decreased attention to task due to speaking with a full oral cavity. Patient consumed Dys. 1 textures and Dys. 2 textures with mildly prolonged mastication and mild left anterior spillage. Recommend patient continue current diet with full supervision for use of compensatory strategies. Throughout snack, patient required Max verbal and visual cues to scan to midline due to a severe left inattention. However, patient able to help lift her LUE with her RUE when asked (applying chair alarm). Moderate-severe deficits were also noted with sustained attention due to verbosity, problem solving and emergent awareness. Patient would benefit from skilled SLP intervention to maximize her swallowing and cognitive functioning prior to discharge.  ?  ?Skilled Therapeutic Interventions          Administered a cognitive-linguistic evaluation and BSE, please see above for details.   ?SLP Assessment ? Patient will need skilled Speech [facility] Pathology Services during CIR  admission  ?  ?Recommendations ? SLP Diet Recommendations: Dysphagia 2 (Fine chop);Thin ?Liquid Administration via: Cup;Straw ?Medication  Administration: Crushed with puree ?Supervision: Patient able to self feed;Full supervision/cueing for compensatory strategies ?Compensations: Slow rate;Small sips/bites;Lingual sweep for clearance of pocketing;Minimize environmental distractions;Monitor for anterior loss ?Postural Changes and/or Swallow Maneuvers: Seated upright 90 degrees ?Oral Care Recommendations: Oral care BID ?Recommendations for Other Services: Neuropsych consult ?Patient destination: Home ?Follow up Recommendations: 24 hour supervision/assistance;Outpatient SLP;Home Health SLP ?Equipment Recommended: None recommended by SLP  ?  ?SLP Frequency 3 to 5 out of 7 days   ?SLP Duration ? ?SLP Intensity ? ?SLP Treatment/Interventions 3-4 weeks ? ?Minumum of 1-2 x/day, 30 to 90 minutes ? ?Cognitive remediation/compensation;Internal/external aids;Dysphagia/aspiration precaution training;Cueing hierarchy;Environmental controls;Therapeutic Activities;Functional tasks;Patient/family education;Speech/Language facilitation   ? ?Pain ?Pain Assessment ?Pain Scale: 0-10 ?Pain Score: 0-No pain ? ?Prior Functioning ?Type of Home: Apartment ? Lives With: Daughter (and uncle who is on dilaysis) ?Available Help at Discharge: Family;Available 24 hours/day ? ?SLP Evaluation ?Cognition ?Overall Cognitive Status: Impaired/Different from baseline ?Arousal/Alertness: Awake/alert ?Orientation Level: Oriented to person;Oriented to place;Oriented to situation;Disoriented to time ?Attention: Focused;Sustained;Selective ?Focused Attention: Appears intact ?Sustained Attention: Impaired ?Sustained Attention Impairment: Verbal basic;Functional basic ?Selective Attention: Impaired ?Selective Attention Impairment: Verbal basic;Functional basic ?Memory: Impaired ?Memory Impairment: Decreased short term memory;Storage deficit;Retrieval deficit ?Decreased Short Term Memory: Verbal basic;Functional basic ?Awareness: Impaired ?Awareness Impairment: Emergent impairment ?Problem  Solving: Impaired ?Problem Solving Impairment: Functional basic;Verbal basic ?Executive Function: Decision Making;Organizing;Sequencing ?Sequencing: Impaired ?Sequencing Impairment: Functional basic ?Organizing: Impaired ?Organizing Impairment: Functional basic ?Decision Making: Impaired ?Decision Making Impairment: Functional basic ?Safety/Judgment: Impaired  ?Comprehension ?Auditory Comprehension ?Overall Auditory Comprehension: Appears within functional limits for tasks assessed ?Expression ?Expression ?Primary Mode of Expression: Verbal ?Verbal Expression ?Overall Verbal Expression: Appears within functional limits for tasks assessed ?Written Expression ?Dominant Hand: Left ?Written Expression: Unable to assess (comment) ?Oral Motor ?Oral Motor/Sensory Function ?Overall Oral Motor/Sensory Function: Mild impairment ?Facial ROM: Reduced left ?Facial Symmetry: Abnormal symmetry left ?Facial Strength: Reduced left ?Motor Speech ?Overall Motor Speech: Impaired ?Respiration: Within functional limits ?Phonation: Normal ?Resonance: Within functional limits ?Articulation: Impaired ?Level of Impairment: Conversation ?Intelligibility: Intelligibility reduced ?Word: 75-100% accurate ?Phrase: 75-100% accurate ?Sentence: 75-100% accurate ?Conversation: 75-100% accurate ?Motor Planning: Witnin functional limits ?Effective Techniques: Slow rate ? ?Care Tool ?Care Tool Cognition ?Ability to hear (with hearing aid or hearing appliances if normally used Ability to hear (with hearing aid or hearing appliances if normally used): 0. Adequate - no difficulty in normal conservation, social interaction, listening to TV ?  ?Expression of Ideas and Wants Expression of Ideas and Wants: 3. Some difficulty - exhibits some difficulty with expressing needs and ideas (e.g, some words or finishing thoughts) or speech is not clear ?  ?Understanding Verbal and Non-Verbal Content Understanding Verbal and Non-Verbal Content: 3. Usually understands -  understands most conversations, but misses some part/intent of message. Requires cues at times to understand  ?Memory/Recall Ability Memory/Recall Ability : That he or she is in a hospital/hospital unit;Current season

## 2022-04-05 NOTE — Plan of Care (Signed)
?  Problem: RH Eating ?Goal: LTG Patient will perform eating w/assist, cues/equip (OT) ?Description: LTG: Patient will perform eating with assist, with/without cues using equipment (OT) ?Flowsheets (Taken 04/05/2022 1252) ?LTG: Pt will perform eating with assistance level of: Set up assist  ?  ?Problem: RH Grooming ?Goal: LTG Patient will perform grooming w/assist,cues/equip (OT) ?Description: LTG: Patient will perform grooming with assist, with/without cues using equipment (OT) ?Flowsheets (Taken 04/05/2022 1252) ?LTG: Pt will perform grooming with assistance level of: Supervision/Verbal cueing ?  ?Problem: RH Bathing ?Goal: LTG Patient will bathe all body parts with assist levels (OT) ?Description: LTG: Patient will bathe all body parts with assist levels (OT) ?Flowsheets (Taken 04/05/2022 1252) ?LTG: Pt will perform bathing with assistance level/cueing: Minimal Assistance - Patient > 75% ?LTG: Position pt will perform bathing: ? At sink ? Shower ?  ?Problem: RH Dressing ?Goal: LTG Patient will perform upper body dressing (OT) ?Description: LTG Patient will perform upper body dressing with assist, with/without cues (OT). ?Flowsheets (Taken 04/05/2022 1252) ?LTG: Pt will perform upper body dressing with assistance level of: Minimal Assistance - Patient > 75% ?Goal: LTG Patient will perform lower body dressing w/assist (OT) ?Description: LTG: Patient will perform lower body dressing with assist, with/without cues in positioning using equipment (OT) ?Flowsheets (Taken 04/05/2022 1252) ?LTG: Pt will perform lower body dressing with assistance level of: Minimal Assistance - Patient > 75% ?  ?Problem: RH Toileting ?Goal: LTG Patient will perform toileting task (3/3 steps) with assistance level (OT) ?Description: LTG: Patient will perform toileting task (3/3 steps) with assistance level (OT)  ?Flowsheets (Taken 04/05/2022 1252) ?LTG: Pt will perform toileting task (3/3 steps) with assistance level: Minimal Assistance - Patient  > 75% ?  ?Problem: RH Toilet Transfers ?Goal: LTG Patient will perform toilet transfers w/assist (OT) ?Description: LTG: Patient will perform toilet transfers with assist, with/without cues using equipment (OT) ?Flowsheets (Taken 04/05/2022 1252) ?LTG: Pt will perform toilet transfers with assistance level of: Minimal Assistance - Patient > 75% ?  ?Problem: RH Tub/Shower Transfers ?Goal: LTG Patient will perform tub/shower transfers w/assist (OT) ?Description: LTG: Patient will perform tub/shower transfers with assist, with/without cues using equipment (OT) ?Flowsheets (Taken 04/05/2022 1252) ?LTG: Pt will perform tub/shower stall transfers with assistance level of: Minimal Assistance - Patient > 75% ?LTG: Pt will perform tub/shower transfers from: Tub/shower combination ?  ?

## 2022-04-05 NOTE — Evaluation (Signed)
Occupational Therapy Assessment and Plan ? ?Patient Details  ?Name: [name] ?MRN: [id number] ?Date of Birth: August 20, 1971 ? ?OT Diagnosis: abnormal posture, acute pain, apraxia, cognitive deficits, disturbance of vision, hemiplegia affecting dominant side, and swelling of limb ?Rehab Potential: Rehab Potential (ACUTE ONLY): Good ?ELOS: 3-4 weeks  ? ?Today's Date: 04/05/2022 ?OT Individual Time: 0930-1030 ?OT Individual Time Calculation (min): 60 min    ? ?Hospital Problem: Principal Problem: ?  ICH (intracerebral hemorrhage) ([facility]) ? ? ?Past Medical History:  ?Past Medical History:  ?Diagnosis Date  ? Hypertension   ? ?Past Surgical History:  ?Past Surgical History:  ?Procedure Laterality Date  ? BREAST LUMPECTOMY  1992  ? CESAREAN SECTION    ? IR GASTROSTOMY TUBE MOD SED  03/06/2022  ? ORIF RADIAL FRACTURE  02/10/2012  ? Procedure: OPEN REDUCTION INTERNAL FIXATION (ORIF) RADIAL FRACTURE;  Surgeon: Cammy Copa, MD;  Location: [facility] OR;  Service: Orthopedics;  Laterality: Right;  ? ORIF ULNAR FRACTURE  02/10/2012  ? Procedure: OPEN REDUCTION INTERNAL FIXATION (ORIF) ULNAR FRACTURE;  Surgeon: Cammy Copa, MD;  Location: [facility] OR;  Service: Orthopedics;  Laterality: Right;  ? TUBAL LIGATION    ? ? ?Assessment & Plan ?Clinical Impression:  Sarah Myers is a left-handed 51 year old female who presented to the emergency department on 02/05/2022 with left-sided weakness and slurred speech and hypertensive emergency. 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 Required hypertonic saline, Cleviprex, intubation. Ultimately required tracheostomy 2/28. Off ventilator on 3/4 and now decannulated. PEG placed on 3/20. Treated for MSSA pneumonia. Bilateral PE on CTA 3/7 on Eliquis. Discharged to [facility] on 03/21/2022. Off tube feeds with intermittent nausea on dysphagia 2 diet, thin liquids. The patient requires inpatient medicine and rehabilitation evaluations and services for  ongoing dysfunction secondary to hemorrhagic stroke..  Patient transferred to CIR on 04/04/2022 .   ? ?Patient currently requires max with basic self-care skills secondary to muscle joint tightness and muscle paralysis, decreased cardiorespiratoy endurance, abnormal tone, unbalanced muscle activation, motor apraxia, decreased coordination, and decreased motor planning, decreased visual acuity, decreased visual perceptual skills, decreased visual motor skills, and field cut, decreased midline orientation, decreased attention to left, left side neglect, and decreased motor planning, decreased attention, decreased awareness, decreased problem solving, decreased safety awareness, decreased memory, and delayed processing, and decreased sitting balance, decreased standing balance, decreased postural control, hemiplegia, and decreased balance strategies.  Prior to hospitalization, patient could complete ADLs and IADLs with independent . ? ?Patient will benefit from skilled intervention to increase independence with basic self-care skills prior to discharge home with care partner.  Anticipate patient will require 24 hour supervision and minimal physical assistance and follow up home health. ? ?OT - End of Session ?Activity Tolerance: Tolerates 30+ min activity with multiple rests ?Endurance Deficit: Yes ?Endurance Deficit Description: Limited standing endurance and hindered by weakness left side ?OT Assessment ?Rehab Potential (ACUTE ONLY): Good ?OT Barriers to Discharge: Inaccessible home environment;Incontinence;Nutrition means ?OT Patient demonstrates impairments in the following area(s): Balance;Perception;Safety;Cognition;Sensory;Edema;Skin Integrity;Endurance;Vision;Motor;Nutrition;Pain ?OT Basic ADL's Functional Problem(s): Eating;Grooming;Bathing;Dressing;Toileting ?OT Transfers Functional Problem(s): Toilet;Tub/Shower ?OT Additional Impairment(s): Fuctional Use of Upper Extremity ?OT Plan ?OT Intensity: Minimum of  1-2 x/day, 45 to 90 minutes ?OT Frequency: 5 out of 7 days ?OT Duration/Estimated Length of Stay: 3-4 weeks ?OT Treatment/Interventions: Balance/vestibular training;Discharge planning;Functional electrical stimulation;Pain management;Self Care/advanced ADL retraining;Therapeutic Activities;UE/LE Coordination activities;Cognitive remediation/compensation;Disease mangement/prevention;Functional mobility training;Patient/family education;Skin care/wound managment;Therapeutic Exercise;Visual/perceptual remediation/compensation;Community reintegration;DME/adaptive equipment instruction;Neuromuscular re-education;Psychosocial support;Splinting/orthotics;UE/LE Strength taining/ROM;Wheelchair propulsion/positioning ?OT Self Feeding  Anticipated Outcome(s): setup ?OT Basic Self-Care Anticipated Outcome(s): min assist ?OT Toileting Anticipated Outcome(s): min assist ?OT Bathroom Transfers Anticipated Outcome(s): min assist ?OT Recommendation ?Patient destination: Home ?Follow Up Recommendations: 24 hour supervision/assistance;Home health OT ?Equipment Recommended: To be determined ? ? ?OT Evaluation ?Precautions/Restrictions  ?Precautions ?Precautions: Fall ?Precaution Comments: L inattention, PEG, left hemi, right head gaze ?Restrictions ?Weight Bearing Restrictions: No ?General ?Chart Reviewed: Yes ?Pain ?Pain Assessment ?Pain Scale: 0-10 ?Pain Score: 0-No pain ?Home Living/Prior Functioning ?Home Living ?Family/patient expects to be discharged to:: Private residence ?Living Arrangements: Children ?Available Help at Discharge: Family, Available 24 hours/day ?Type of Home: Apartment ?Home Access: Stairs to enter (second story house) ?Entrance Stairs-Number of Steps: full flight ?Entrance Stairs-Rails: Right, Left, Can reach both ?Home Layout: One level ?Alternate Level Stairs-Rails: Right, Left ?Bathroom Shower/Tub: Tub/shower unit ? 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 ?Leisure and Hobbies: cooking ?Prior Function ?Level of Independence: Independent with basic ADLs, Independent with homemaking with ambulation, Independent with gait, Independent with transfers ?Driving: Yes ?Vision ?Baseline Vision/History: 1 Wears glasses (reading and driving) ?Ability to See in Adequate Light: 3 Highly impaired ?Patient Visual Report: Blurring of vision ?Vision Assessment?: Vision impaired- to be further tested in functional context;Yes ?Eye Alignment: Impaired (comment) ?Ocular Range of Motion: Within Functional Limits ?Alignment/Gaze Preference: Gaze right ?Tracking/Visual Pursuits: Requires cues, head turns, or add eye shifts to track;Decreased smoothness of vertical tracking;Decreased smoothness of horizontal tracking ?Saccades: Additional head turns occurred during testing;Additional eye shifts occurred during testing;Decreased speed of saccadic movement ?Convergence: Impaired (comment) ?Visual Fields: Left visual field deficit;Impaired-to be further tested in functional context ?Perception  ?Perception: Impaired ?Inattention/Neglect: Does not attend to left side of body;Does not attend to left visual field ?Praxis ?Praxis: Intact ?Cognition ?Cognition ?Overall Cognitive Status: Impaired/Different from baseline ?Arousal/Alertness: Awake/alert ?Orientation Level: Person;Situation;Place ?Person: Oriented ?Place: Oriented ?Situation: Oriented ?Memory: Impaired ?Memory Impairment: Decreased short term memory;Storage deficit;Retrieval deficit ?Decreased Short Term Memory: Verbal basic;Functional basic ?Attention: Focused;Sustained;Selective ?Focused Attention: Appears intact ?Sustained Attention: Appears intact ?Selective Attention: Impaired ?Selective Attention Impairment: Verbal basic;Functional basic ?Awareness: Impaired ?Awareness Impairment: Emergent impairment ?Problem Solving:  Impaired ?Problem Solving Impairment: Functional basic ?Executive Function: Decision Making;Organizing;Sequencing ?Sequencing: Impaired ?Sequencing Impairment: Functional basic ?Organizing: Impaired ?Organizing Impairment

## 2022-04-05 NOTE — Progress Notes (Signed)
Inpatient Rehabilitation Center ?Individual Statement of Services ? ?Patient Name:  Sarah Myers  ?Date:  04/05/2022 ? ?Welcome to the Inpatient Rehabilitation Center.  Our goal is to provide you with an individualized program based on your diagnosis and situation, designed to meet your specific needs.  With this comprehensive rehabilitation program, you will be expected to participate in at least 3 hours of rehabilitation therapies Monday-Friday, with modified therapy programming on the weekends. ? ?Your rehabilitation program will include the following services:  Physical Therapy (PT), Occupational Therapy (OT), Speech Therapy (ST), 24 hour per day rehabilitation nursing, Therapeutic Recreaction (TR), Neuropsychology, Care Coordinator, Rehabilitation Medicine, Nutrition Services, and Pharmacy Services ? ?Weekly team conferences will be held on Wednesday to discuss your progress.  Your Inpatient Rehabilitation Care Coordinator will talk with you frequently to get your input and to update you on team discussions.  Team conferences with you and your family in attendance may also be held. ? ?Expected length of stay: 3-4 weeks  Overall anticipated outcome: overall min assist level ? ?Depending on your progress and recovery, your program may change. Your Inpatient Rehabilitation Care Coordinator will coordinate services and will keep you informed of any changes. Your Inpatient Rehabilitation Care Coordinator's name and contact numbers are listed  below. ? ?The following services may also be recommended but are not provided by the Inpatient Rehabilitation Center:  ?Driving Evaluations ?Home Health Rehabiltiation Services ?Outpatient Rehabilitation Services ?Vocational Rehabilitation ?  ?Arrangements will be made to provide these services after discharge if needed.  Arrangements include referral to agencies that provide these services. ? ?Your insurance has been verified to be:  [employer] ?Your primary doctor  is:  Luna Kitchens ? ?Pertinent information will be shared with your doctor and your insurance company. ? ?Inpatient Rehabilitation Care Coordinator:  Dossie Der, LCSW [phone] or (C) [phone] ? ?Information discussed with and copy given to patient by: [name], 04/05/2022, 9:59 AM    ?

## 2022-04-05 NOTE — Plan of Care (Signed)
?  Problem: Consults ?Goal: RH STROKE PATIENT EDUCATION ?Description: See Patient Education module for education specifics  ?Outcome: Progressing ?  ?Problem: RH SKIN INTEGRITY ?Goal: RH STG MAINTAIN SKIN INTEGRITY WITH ASSISTANCE ?Description: STG Maintain Skin Integrity With min Assistance. ?Outcome: Progressing ?Goal: RH STG ABLE TO PERFORM INCISION/WOUND CARE W/ASSISTANCE ?Description: STG Able To Perform Incision/Wound Care With min Assistance. ?Outcome: Progressing ?  ?Problem: RH SAFETY ?Goal: RH STG ADHERE TO SAFETY PRECAUTIONS W/ASSISTANCE/DEVICE ?Description: STG Adhere to Safety Precautions With cues Assistance/Device. ?Outcome: Progressing ?  ?Problem: RH PAIN MANAGEMENT ?Goal: RH STG PAIN MANAGED AT OR BELOW PT'S PAIN GOAL ?Description: At or below level 4 w prns ?Outcome: Progressing ?  ?Problem: RH KNOWLEDGE DEFICIT ?Goal: RH STG INCREASE KNOWLEDGE OF HYPERTENSION ?Description: Patient and family will be able to manage HTN with medications and dietary modifications using handouts and educational resources independently ?Outcome: Progressing ?Goal: RH STG INCREASE KNOWLEDGE OF DYSPHAGIA/FLUID INTAKE ?Description: Patient and family will be able to manage HLD with medications and dietary modifications using handouts and educational resources independently ?Outcome: Progressing ?Goal: RH STG INCREASE KNOWLEDGE OF STROKE PROPHYLAXIS ?Description: Patient and family will be able to manage stroke risks with medications and dietary modifications using handouts and educational resources independently ?Outcome: Progressing ?  ?

## 2022-04-05 NOTE — Evaluation (Signed)
Physical Therapy Assessment and Plan ? ?Patient Details  ?Name: Sarah Myers ?MRN: [id number] ?Date of Birth: 1971-04-12 ? ?PT Diagnosis: Abnormal posture, Abnormality of gait, Difficulty walking, Hemiparesis dominant, Impaired cognition, Impaired sensation, and Muscle weakness ?Rehab Potential: Fair ?ELOS: 3-4 weeks  ? ?Today's Date: 04/05/2022 ?PT Individual Time: [id number] ?PT Individual Time Calculation (min): 73 min   ? ?Hospital Problem: Principal Problem: ?  ICH (intracerebral hemorrhage) ([place]) ? ? ?Past Medical History:  ?Past Medical History:  ?Diagnosis Date  ? Hypertension   ? ?Past Surgical History:  ?Past Surgical History:  ?Procedure Laterality Date  ? BREAST LUMPECTOMY  1992  ? CESAREAN SECTION    ? IR GASTROSTOMY TUBE MOD SED  03/06/2022  ? ORIF RADIAL FRACTURE  02/10/2012  ? Procedure: OPEN REDUCTION INTERNAL FIXATION (ORIF) RADIAL FRACTURE;  Surgeon: Meredith Pel, MD;  Location: [place];  Service: Orthopedics;  Laterality: Right;  ? ORIF ULNAR FRACTURE  02/10/2012  ? Procedure: OPEN REDUCTION INTERNAL FIXATION (ORIF) ULNAR FRACTURE;  Surgeon: Meredith Pel, MD;  Location: [place];  Service: Orthopedics;  Laterality: Right;  ? TUBAL LIGATION    ? ? ?Assessment & Plan ?Clinical Impression: Patient is a left-handed 51 year old female who presented to the emergency department on 02/05/2022 with left-sided weakness and slurred speech and hypertensive emergency. 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 Required hypertonic saline, Cleviprex, intubation. Ultimately required tracheostomy 2/28. Off ventilator on 3/4 and now decannulated. PEG placed on 3/20. Treated for MSSA pneumonia. Bilateral PE on CTA 3/7 on Eliquis. Discharged to [facility] on 03/21/2022. Off tube feeds with intermittent nausea on dysphagia 2 diet, thin liquids. The patient requires inpatient medicine and rehabilitation evaluations and services for ongoing dysfunction secondary to hemorrhagic  stroke. Patient transferred to CIR on 04/04/2022 .  ? ?Patient currently requires max with mobility secondary to muscle weakness and muscle joint tightness, decreased cardiorespiratoy endurance, abnormal tone, unbalanced muscle activation, and motor apraxia, decreased visual acuity, decreased visual perceptual skills, and decreased visual motor skills, decreased midline orientation, left side neglect, and decreased motor planning, decreased initiation, decreased attention, decreased awareness, decreased problem solving, decreased safety awareness, decreased memory, and delayed processing, and decreased sitting balance, decreased standing balance, decreased postural control, hemiplegia, and decreased balance strategies.  Prior to hospitalization, patient was independent  with mobility and lived with Daughter (and uncle who is on dilaysis) in a [place] home.  Home access is full flightStairs to enter. ? ?Patient will benefit from skilled PT intervention to maximize safe functional mobility, minimize fall risk, and decrease caregiver burden for planned discharge home with 24 hour assist.  Anticipate patient will benefit from follow up [facility] at discharge. ? ?PT - End of Session ?Activity Tolerance: Tolerates < 10 min activity, no significant change in vital signs ?Endurance Deficit: Yes ?Endurance Deficit Description: Limited standing endurance and hindered by weakness left side ?PT Assessment ?Rehab Potential (ACUTE/IP ONLY): Fair ?PT Barriers to Discharge: Insurance for SNF coverage;Home environment access/layout;Incontinence;Inaccessible home environment;Nutrition means ?PT Patient demonstrates impairments in the following area(s): Balance;Endurance;Motor;Nutrition;Pain;Safety;Sensory;Perception;Skin Integrity ?PT Transfers Functional Problem(s): Bed Mobility;Bed to Chair;Car ?PT Locomotion Functional Problem(s): Ambulation;Wheelchair Mobility;Stairs ?PT Plan ?PT Intensity: Minimum of 1-2 x/day ,45 to 90 minutes ?PT  Frequency: 5 out of 7 days ?PT Duration Estimated Length of Stay: 3-4 weeks ?PT Treatment/Interventions: Ambulation/gait training;Balance/vestibular training;Skin care/wound management;Cognitive remediation/compensation;Community reintegration;Discharge planning;Disease management/prevention;DME/adaptive equipment instruction;Functional mobility training;Functional electrical stimulation;Neuromuscular re-education;Pain management;Patient/family education;Psychosocial support;Splinting/orthotics;Stair training;Therapeutic Activities;Therapeutic Exercise;UE/LE Strength taining/ROM;UE/LE Coordination activities;Visual/perceptual remediation/compensation;Wheelchair propulsion/positioning ?PT  Transfers Anticipated Outcome(s): minA with LRAD ?PT Locomotion Anticipated Outcome(s): minA with LRAD - anticipate wheelchair level ?PT Recommendation ?Recommendations for Other Services: Therapeutic Recreation consult ?Therapeutic Recreation Interventions: Pet therapy;Stress management ?Follow Up Recommendations: Home health PT;24 hour supervision/assistance ?Patient destination: Home ?Equipment Recommended: To be determined ? ? ?PT Evaluation ?Precautions/Restrictions ?Precautions ?Precautions: Fall ?Precaution Comments: L inattention, PEG, left hemi, right head gaze ?Restrictions ?Weight Bearing Restrictions: No ?General ?  Vital Signs ?Pain ?Pain Assessment ?Pain Scale: 0-10 ?Pain Score: 0-No pain ?Pain Interference ?Pain Interference ?Pain Effect on Sleep: 2. Occasionally ?Pain Interference with Therapy Activities: 2. Occasionally ?Pain Interference with Day-to-Day Activities: 2. Occasionally ?Home Living/Prior Functioning ?Home Living ?Available Help at Discharge: Family;Available 24 hours/day ?Type of Home: Apartment ?Home Access: Stairs to enter ?Entrance Stairs-Number of Steps: full flight ?Entrance Stairs-Rails: Right;Left;Can reach both ?Home Layout: One level ?Alternate Level Stairs-Rails: Right;Left ?Bathroom  Shower/Tub: Tub/shower unit ? Lives With: Daughter (and uncle who is on dilaysis) ?Prior Function ?Level of Independence: Independent with basic ADLs;Independent with homemaking with ambulation;Independent with gait;Independent with transfers ? Able to Take Stairs?: Yes ?Driving: Yes ?Vision/Perception  ?Vision - History ?Ability to See in Adequate Light: 3 Highly impaired ?Vision - Assessment ?Eye Alignment: Impaired (comment) ?Ocular Range of Motion: Restricted on the left;Impaired-to be further tested in functional context ?Alignment/Gaze Preference: Gaze right;Head turned;Head tilt ?Tracking/Visual Pursuits: Requires cues, head turns, or add eye shifts to track;Decreased smoothness of vertical tracking;Decreased smoothness of horizontal tracking ?Saccades: Additional head turns occurred during testing;Additional eye shifts occurred during testing;Decreased speed of saccadic movement ?Convergence: Impaired (comment) ?Perception ?Perception: Impaired ?Inattention/Neglect: Does not attend to left side of body;Does not attend to left visual field ?Praxis ?Praxis: Intact  ?Cognition ?Overall Cognitive Status: Impaired/Different from baseline ?Arousal/Alertness: Awake/alert ?Orientation Level: Oriented to person;Oriented to place;Oriented to situation;Disoriented to time (unaware of year) ?Attention: Focused;Sustained;Selective ?Focused Attention: Appears intact ?Sustained Attention: Appears intact ?Selective Attention: Impaired ?Selective Attention Impairment: Verbal basic;Functional basic ?Memory: Impaired ?Memory Impairment: Decreased short term memory;Storage deficit;Retrieval deficit ?Decreased Short Term Memory: Verbal basic;Functional basic ?Awareness: Impaired ?Awareness Impairment: Emergent impairment ?Problem Solving: Impaired ?Problem Solving Impairment: Functional basic ?Executive Function: Decision Making;Organizing;Sequencing ?Sequencing: Impaired ?Sequencing Impairment: Functional basic ?Organizing:  Impaired ?Organizing Impairment: Functional basic ?Decision Making: Impaired ?Decision Making Impairment: Functional basic ?Safety/Judgment: Impaired (mild) ?Sensation ?Sensation ?Light Touch: Impaired Detail ?Light

## 2022-04-05 NOTE — Plan of Care (Signed)
?  Problem: RH Swallowing ?Goal: LTG Patient will consume least restrictive diet using compensatory strategies with assistance (SLP) ?Description: LTG:  Patient will consume least restrictive diet using compensatory strategies with assistance (SLP) ?Flowsheets (Taken 04/05/2022 1508) ?LTG: Pt Patient will consume least restrictive diet using compensatory strategies with assistance of (SLP): Modified Independent ?Goal: LTG Patient will participate in dysphagia therapy to increase swallow function with assistance (SLP) ?Description: LTG:  Patient will participate in dysphagia therapy to increase swallow function with assistance (SLP) ?Flowsheets (Taken 04/05/2022 1508) ?LTG: Pt will participate in dysphagia therapy to increase swallow function with assistance of (SLP): Supervision ?Goal: LTG Pt will demonstrate functional change in swallow as evidenced by bedside/clinical objective assessment (SLP) ?Description: LTG: Patient will demonstrate functional change in swallow as evidenced by bedside/clinical objective assessment (SLP) ?Flowsheets (Taken 04/05/2022 1508) ?LTG: Patient will demonstrate functional change in swallow as evidenced by bedside/clinical objective assessment: Oral swallow ?  ?Problem: RH Problem Solving ?Goal: LTG Patient will demonstrate problem solving for (SLP) ?Description: LTG:  Patient will demonstrate problem solving for basic/complex daily situations with cues  (SLP) ?Flowsheets (Taken 04/05/2022 1508) ?LTG: Patient will demonstrate problem solving for (SLP): Complex daily situations ?LTG Patient will demonstrate problem solving for: Minimal Assistance - Patient > 75% ?  ?Problem: RH Attention ?Goal: LTG Patient will demonstrate this level of attention during functional activites (SLP) ?Description: LTG:  Patient will will demonstrate this level of attention during functional activites (SLP) ?Flowsheets (Taken 04/05/2022 1508) ?Patient will demonstrate during cognitive/linguistic activities the  attention type of: Sustained ?LTG: Patient will demonstrate this level of attention during cognitive/linguistic activities with assistance of (SLP): Minimal Assistance - Patient > 75% ?  ?Problem: RH Awareness ?Goal: LTG: Patient will demonstrate awareness during functional activites type of (SLP) ?Description: LTG: Patient will demonstrate awareness during functional activites type of (SLP) ?Flowsheets (Taken 04/05/2022 1508) ?Patient will demonstrate during cognitive/linguistic activities awareness type of: Emergent ?LTG: Patient will demonstrate awareness during cognitive/linguistic activities with assistance of (SLP): Minimal Assistance - Patient > 75% ?  ?

## 2022-04-05 NOTE — Progress Notes (Signed)
?                                                       PROGRESS NOTE ? ? ?Subjective/Complaints: ?Appreciate overnight nursing note.  ?Daughter very involved.  ?Has constipation and elevated blood pressure- will discuss starting magnesium supplement at night ? ?ROS: +constipation ? ?Objective: ?  ?No results found. ?Recent Labs  ?  04/05/22 ?4142  ?WBC 6.1  ?HGB 13.1  ?HCT 39.1  ?PLT 222  ? ?Recent Labs  ?  04/05/22 ?3953  ?NA 138  ?K 3.3*  ?CL 106  ?CO2 23  ?GLUCOSE 107*  ?BUN 14  ?CREATININE 0.87  ?CALCIUM 10.0  ? ? ?Intake/Output Summary (Last 24 hours) at 04/05/2022 0655 ?Last data filed at 04/05/2022 0600 ?Gross per 24 hour  ?Intake --  ?Output 800 ml  ?Net -800 ml  ?  ? ?  ? ?Physical Exam: ?Vital Signs ?Blood pressure (!) 154/80, pulse (!) 58, temperature 99.2 ?F (37.3 ?C), temperature source Oral, resp. rate 15, height 5\' 4"  (1.626 m), weight 60 kg, SpO2 99 %. ?Gen: no distress, normal appearing ?HEENT: oral mucosa pink and moist, NCAT ?Cardio: Reg rate ?Chest: normal effort, normal rate of breathing ?Abd: soft, non-distended ?Musculoskeletal:  ?   Cervical back: Neck supple. No tenderness.  ?   Comments: RUE- 4/5 or more- hard to test ?RLE 3+/5 proximally and 4-/5 distally ?LUE- 0/5 ?LLE- 2-/5 in HF and PF only ?  ?Skin: ?   General: Skin is warm and dry.  ?   Comments: Scab 1.5 cm on L forearm- otherwise skin looks OK  ?Neurological:  ?   Mental Status: She is alert and oriented to person, place, and time.  ?   Comments: Severe L inattention/neglect ?Intact to light touch in all 4 extremities ?Dysconjugate gaze, but denies double vision.  ?MAS of 2-3 in LUE_ shoulder MAS of 3; elbow 2-3 and MAS of 2 in L wrist ?MAS of 1+ to 2 in LLE ?Puffy L hand ?3 beats clonus LLE and (+) hoffman's LUE  ?Psychiatric:     ?   Mood and Affect: Mood normal.  ?   Comments: vague  ? ? ?Assessment/Plan: ?1. Functional deficits which require 3+ hours per day of interdisciplinary therapy in a comprehensive inpatient rehab  setting. ?Physiatrist is providing close team supervision and 24 hour management of active medical problems listed below. ?Physiatrist and rehab team continue to assess barriers to discharge/monitor patient progress toward functional and medical goals ? ?Care Tool: ? ?Bathing ?   ?   ?   ?  ?  ?Bathing assist   ?  ?  ?Upper Body Dressing/Undressing ?Upper body dressing   ?  ?   ?Upper body assist   ?   ?Lower Body Dressing/Undressing ?Lower body dressing ? ? ?   ?  ? ?  ? ?Lower body assist   ?   ? ?Toileting ?Toileting    ?Toileting assist   ?  ?  ?Transfers ?Chair/bed transfer ? ?Transfers assist ?   ? ?  ?  ?  ?Locomotion ?Ambulation ? ? ?Ambulation assist ? ?   ? ?  ?  ?   ? ?Walk 10 feet activity ? ? ?Assist ?   ? ?  ?   ? ?Walk 50 feet  activity ? ? ?Assist   ? ?  ?   ? ? ?Walk 150 feet activity ? ? ?Assist   ? ?  ?  ?  ? ?Walk 10 feet on uneven surface  ?activity ? ? ?Assist   ? ? ?  ?   ? ?Wheelchair ? ? ? ? ?Assist   ?  ?  ? ?  ?   ? ? ?Wheelchair 50 feet with 2 turns activity ? ? ? ?Assist ? ?  ?  ? ? ?   ? ?Wheelchair 150 feet activity  ? ? ? ?Assist ?   ? ? ?   ? ?Blood pressure (!) 154/80, pulse (!) 58, temperature 99.2 ?F (37.3 ?C), temperature source Oral, resp. rate 15, height 5\' 4"  (1.626 m), weight 60 kg, SpO2 99 %. ? ?Medical Problem List and Plan: ?1. 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            -patient may  shower- cover PEG ?            -ELOS/Goals: 14-16 days min A to supervision ? Admit to CIR ?2.  Antithrombotics: ?-DVT/anticoagulation:  Pharmaceutical: Other (comment)Eliquis ?            -antiplatelet therapy: none ?3. Pain Management: Tylenol prn, Robaxin and Neurotin scheduled, Lidoderm patch ?4. Mood: LCSW to evaluate and provide emotional support ?            --continue sertraline 50 mg daily ?            -antipsychotic agents: n/a ?5. Neuropsych: This patient is capable of making decisions on her own behalf. ?6. Skin/Wound Care: Routine skin care  checks ?7. Fluids/Electrolytes/Nutrition: Routine Is and Os and follow-up chemistries ?8: Left-sided weakness: ?            --scheduled Baclofen for spasticity 5 mg TID ?            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 Add magnesium gluconate 250mg  HS ?12. Constipation: add magnesium gluconate 250mg  HS ?13. Hypokalemia: start 04/23/2022 klor BID, repeat BMP tomorrow.  ?  ?  ? ?LOS: ?1 days ?A FACE TO FACE EVALUATION WAS PERFORMED ? ? Sarah Myers ?04/05/2022, 6:55 AM  ? ?  ?

## 2022-04-05 NOTE — Progress Notes (Signed)
Inpatient Rehabilitation  Patient information reviewed and entered into eRehab system by Deryn Massengale Roald Lukacs, OTR/L.   Information including medical coding, functional ability and quality indicators will be reviewed and updated through discharge.    

## 2022-04-05 NOTE — Patient Care Conference (Signed)
Inpatient RehabilitationTeam Conference and Plan of Care Update ?Date: 04/05/2022   Time: 11:24 AM  ? ? ?Patient Name: Sarah Myers      ?Medical Record Number: [id number]  ?Date of Birth: 10-Aug-1971 ?Sex: Female         ?Room/Bed: 4M01C/4M01C-01 ?Payor Info: Payor: [employer] / Plan: [employer] / Product Type: *No Product type* /   ? ?Admit Date/Time:  04/04/2022  3:17 PM ? ?Primary Diagnosis:  ICH (intracerebral hemorrhage) ([facility]) ? ?Hospital Problems: Principal Problem: ?  ICH (intracerebral hemorrhage) ([facility]) ? ? ? ?Expected Discharge Date: Expected Discharge Date:  (2.5 ELOS; evals pending) ? ?Team Members Present: ?Physician leading conference: Dr. Sula Soda ?Social Worker Present: Dossie Der, LCSW ?Nurse Present: Chana Bode, RN ?PT Present: Wynelle Link, PT ?OT Present: Dolphus Jenny, OT ?SLP Present: Other (comment) Sarita Bottom, SLP) ? ?   Current Status/Progress Goal Weekly Team Focus  ?Bowel/Bladder ? ? Continent of B/B. LBM 04/04/22  Maintain continence.      ?Swallow/Nutrition/ Hydration ? ?           ?ADL's ? ? mod assist sit<>stand, max assist squat pivot, mod assist UB dressing, max assist LB dressing, significant right gaze and left neglect/inattention  min assist  initial evaluation, caregiver/pt education, self care and hemi technique training, functional transfers, visual scanning,   ?Mobility ? ? PT EVAL PENDING         ?Communication ? ?           ?Safety/Cognition/ Behavioral Observations ?           ?Pain ? ? Denies pain  Remain pain free.      ?Skin ? ? Echymoses bilateral abdomen, otherwise grossly Intact.  Prevent new skin breakdown.  Assess Q shift and prn.   ? ? ?Discharge Planning:  ?HOme with daughter-[name] who stays with her here, other two daughter;s are involved also. New evaluation today   ?Team Discussion: ?Patient with left side weakness, pain and edema with flat affect post ICH. Marland Kitchen Progress limited by dysphagia, left inattention, right gaze  preference, and hiked shoulder. HTN addressed as well as constipation. Wounds on left arm/hand edema and neck wound. ? ?Patient on target to meet rehab goals: ?yes, currently needs mod assist for squat pivots and upper body dressing max assist for lower body care.  ? ?*See Care Plan and progress notes for long and short-term goals.  ? ?Revisions to Treatment Plan:  ?Kinesio tape for hand ?Neuro-strategies ?  ?Teaching Needs: ?Safety, skin care, medications, nutritional means, transfers, and toileting  ?Current Barriers to Discharge: ?Decreased caregiver support ? ?Possible Resolutions to Barriers: ?Family education ?  ? ? Medical Summary ?Current Status: left sided severe weakness, left hand pain and swelling, left shoulder pain, hypertension, ICH, decreased sensation left side, dysphagia, hypokalemia ? Barriers to Discharge: Medical stability ? Barriers to Discharge Comments: left sided severe weakness, left hand pain and swelling, left shoulder pain, hypertension, ICH, decreased sensation left side, dysphagia, hypokalemia ?Possible Resolutions to [employer]: add voltaren gel, add medihoney, continue blood pressure medications, elevated hand, alternate heat and ice for pain, provided dietary education ? ? ?Continued Need for Acute Rehabilitation Level of Care: The patient requires daily medical management by a physician with specialized training in physical medicine and rehabilitation for the following reasons: ?Direction of a multidisciplinary physical rehabilitation program to maximize functional independence : Yes ?Medical management of patient stability for increased activity during participation in an intensive rehabilitation regime.: Yes ?  Analysis of laboratory values and/or radiology reports with any subsequent need for medication adjustment and/or medical intervention. : Yes ? ? ?I attest that I was present, lead the team conference, and concur with the assessment and plan of the  team. ? ? ?Chana Bode B ?04/05/2022, 2:20 PM  ? ? ? ? ? ? ?

## 2022-04-05 NOTE — Discharge Summary (Signed)
Physician Discharge Summary  ?Patient ID: ?[name] ?MRN: [id number] ?DOB/AGE: 06/23/1971 51 y.o. ? ?Admit date: 04/04/2022 ?Discharge date: 04/26/2022 ? ?Discharge Diagnoses:  ?Principal Problem: ?  ICH (intracerebral hemorrhage) (HCC) ?Active problems: ?Left-sided weakness ?Dysphagia ?Bilateral pulmonary emboli ?Hypertension ?Left shoulder pain ?Left hamstring spasm ?Constipation ?Hypokalemia ?Left-sided inattention ? ?Discharged Condition: stable ? ?Significant Diagnostic Studies: ?No results found. ? ?Labs:  ?Basic Metabolic Panel: ?Recent Labs  ?Lab 04/24/22 ?4098  ?NA 140  ?K 3.7  ?CL 111  ?CO2 24  ?GLUCOSE 100*  ?BUN 6  ?CREATININE 0.89  ?CALCIUM 11.2*  ? ? ?CBC: ?Recent Labs  ?Lab 04/24/22 ?1191  ?WBC 3.4*  ?HGB 13.6  ?HCT 41.1  ?MCV 87.1  ?PLT 200  ? ? ?CBG: ?Recent Labs  ?Lab 04/25/22 ?1127 04/25/22 ?1621 04/25/22 ?2049 04/26/22 ?4782 04/26/22 ?1136  ?GLUCAP 115* 98 119* 125* 109*  ? ? ?Brief HPI:   MALORIE BIGFORD is a 51 y.o. female who presented to the emergency department on 02/05/2022 with left-sided weakness and slurred speech and hypertensive emergency.  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  She required intubation and ultimately tracheostomy.  PEG was placed on 3/20.  She was treated for MSSA pneumonia.  CTA of the chest on 3/7 revealed bilateral pulmonary emboli.  She was discharged to specialty hospital on 03/21/2022. Eliquis continued. ? ? ?Hospital Course: AYSEL GILCHREST was admitted to rehab 04/04/2022 for inpatient therapies to consist of PT, ST and OT at least three hours five days a week. Past admission physiatrist, therapy team and rehab RN have worked together to provide customized collaborative inpatient rehab. Noted on 4/19 to have elevated BP readings and constipation. Magnesium supplement q HS started. Also, potassium level 3.3 and given Kcl 20 mEq BID. Normalized on 4/24 and dose decreased to once daily. Pain patch/meds for left shoulder  pain. Baclofen dosing adjusted due to left hamstring spasm/spacticity. Changed to HS dosing due to lethargy. Gastrostomy tube removed on 5/5.  She was stable for discharge home with her daughter who will provide 24/7 care. ? ?Blood pressures were monitored on TID basis and stable on hydralazine, carvedilol and amlodipine. Magnesium started and increased to 500 mg q HS. Cozaar started back on 4/22. Goals downgraded and 15/7 therapy planned 5/3. ? ?Rehab course: During patient's stay in rehab weekly team conferences were held to monitor patient's progress, set goals and discuss barriers to discharge. At admission, patient required dysphagia 2 diet with full supervision, max A for mobility and transfers and ADLs. ? ?She has had improvement in activity tolerance, balance, postural control as well as ability to compensate for deficits. She has had improvement in functional use RUE/LUE  and RLE/LLE as well as improvement in awareness.  Discharge activity level moderate assist with wheelchair level.  She was able to come into standing with minimal assistance and maintain min to mod while assisting with closely management. ? ?Disposition: Home ?Discharge disposition: 01-Home or Self Care ? ? ? ? ?Diet: Heart healthy/dysphagia 3, thin liquids ? ?Special Instructions: ? ?Crush meds and give with applesauce. ? ?No driving, alcohol consumption or tobacco use.  ? ?30-35 minutes were spent on discharge planning and discharge summary.  ? ?Discharge Instructions   ? ? Discharge patient   Complete by: As directed ?  ? Discharge disposition: 01-Home or Self Care  ? Discharge patient date: 04/26/2022  ? ?  ? ? ?Allergies as of 04/26/2022   ? ?   Reactions  ?  Lisinopril Cough  ? ?  ? ?  ?Medication List  ?  ? ?STOP taking these medications   ? ?ALPRAZolam 0.5 MG tablet ?Commonly known as: XANAX ?  ?alum & mag hydroxide-simeth 200-200-20 MG/5ML suspension ?Commonly known as: MAALOX/MYLANTA ?  ?clonazePAM 0.5 MG tablet ?Commonly known as:  KLONOPIN ?  ?cloNIDine 0.1 MG tablet ?Commonly known as: CATAPRES ?  ?dextrose 50 % solution ?  ?Ensure ?  ?feeding supplement (JEVITY 1.5 CAL/FIBER) Liqd ?  ?feeding supplement (PRO-STAT 64) Liqd ?  ?feeding supplement (PROSource TF) liquid ?  ?free water Soln ?  ?Glucagon HCl 1 MG Solr ?  ?guaiFENesin 200 MG tablet ?  ?insulin aspart 100 UNIT/ML injection ?Commonly known as: novoLOG ?  ?insulin lispro 100 UNIT/ML injection ?Commonly known as: HUMALOG ?  ?lidocaine 5 % ?Commonly known as: LIDODERM ?  ?methocarbamol 500 MG tablet ?Commonly known as: ROBAXIN ?  ?neomycin-bacitracin-polymyxin Oint ?Commonly known as: NEOSPORIN ?  ?OLANZapine 2.5 MG tablet ?Commonly known as: ZYPREXA ?  ?oxyCODONE 5 MG immediate release tablet ?Commonly known as: Oxy IR/ROXICODONE ?  ?pantoprazole sodium 40 mg ?Commonly known as: PROTONIX ?  ?polyethylene glycol 17 g packet ?Commonly known as: MIRALAX / GLYCOLAX ?  ?senna-docusate 8.6-50 MG tablet ?Commonly known as: Senokot-S ?  ?sodium polystyrene 15 GM/60ML suspension ?Commonly known as: KAYEXALATE ?  ?TAB-A-VITE/BETA CAROTENE PO ?  ? ?  ? ?TAKE these medications   ? ?acetaminophen 325 MG tablet ?Commonly known as: TYLENOL ?Take 1-2 tablets (325-650 mg total) by mouth every 4 (four) hours as needed for mild pain. ?What changed:  ?how much to take ?how to take this ?reasons to take this ?  ?amantadine 100 MG capsule ?Commonly known as: SYMMETREL ?Take 1 capsule (100 mg total) by mouth 2 (two) times daily. ?What changed:  ?how much to take ?how to take this ?Another medication with the same name was removed. Continue taking this medication, and follow the directions you see here. ?  ?amLODipine 10 MG tablet ?Commonly known as: NORVASC ?Take 1 tablet (10 mg total) by mouth daily. ?What changed:  ?how to take this ?Another medication with the same name was removed. Continue taking this medication, and follow the directions you see here. ?  ?apixaban 5 MG Tabs tablet ?Commonly known as:  ELIQUIS ?Take 1 tablet (5 mg total) by mouth 2 (two) times daily. ?What changed: how to take this ?  ?Baclofen 5 MG Tabs ?Take 15 mg by mouth at bedtime. Take 5 mg (1 tab) each morning. ?  ?carvedilol 6.25 MG tablet ?Commonly known as: COREG ?Take 1 tablet (6.25 mg total) by mouth 2 (two) times daily with a meal. ?What changed:  ?medication strength ?how much to take ?how to take this ?Another medication with the same name was removed. Continue taking this medication, and follow the directions you see here. ?  ?famotidine 20 MG tablet ?Commonly known as: PEPCID ?Take 1 tablet (20 mg total) by mouth 2 (two) times daily. ?What changed: how to take this ?  ?gabapentin 100 MG capsule ?Commonly known as: NEURONTIN ?Take 1 capsule (100 mg total) by mouth 2 (two) times daily. ?  ?hydrALAZINE 50 MG tablet ?Commonly known as: APRESOLINE ?Take 1 tablet (50 mg total) by mouth every 6 (six) hours. ?What changed:  ?medication strength ?how much to take ?how to take this ?when to take this ?Another medication with the same name was removed. Continue taking this medication, and follow the directions you see here. ?  ?  losartan 25 MG tablet ?Commonly known as: COZAAR ?Take 1 tablet (25 mg total) by mouth daily. ?Start taking on: Apr 27, 2022 ?  ?magnesium gluconate 500 MG tablet ?Commonly known as: MAGONATE ?Take 1 tablet (500 mg total) by mouth at bedtime. ?  ?multivitamin with minerals Tabs tablet ?Take 1 tablet by mouth daily. ?Start taking on: Apr 27, 2022 ?What changed: how to take this ?  ?potassium chloride 20 MEQ packet ?Commonly known as: KLOR-CON ?Take 20 mEq by mouth daily. ?Start taking on: Apr 27, 2022 ?What changed:  ?when to take this ?reasons to take this ?  ?sertraline 50 MG tablet ?Commonly known as: ZOLOFT ?Take 1 tablet (50 mg total) by mouth at bedtime. ?What changed:  ?how to take this ?when to take this ?  ? ?  ? ? Follow-up Information   ? ? Horton Chin, MD Follow up.   ?Specialty: Physical Medicine  and Rehabilitation ?Why: 5/16 please arrive at 2:40pm for 3:00pm appointment, thank you! ?Contact information: ?[address] ?[address] ?[place] [address] ?[phone] ? ? ?  ?  ? ? Doreatha Massed

## 2022-04-05 NOTE — Progress Notes (Signed)
Inpatient Rehabilitation Care Coordinator ?Assessment and Plan ?Patient Details  ?Name: Sarah Myers ?MRN: [id number] ?Date of Birth: 1971-04-21 ? ?Today's Date: 04/05/2022 ? ?Hospital Problems: Principal Problem: ?  ICH (intracerebral hemorrhage) (HCC) ? ?Past Medical History:  ?Past Medical History:  ?Diagnosis Date  ? Hypertension   ? ?Past Surgical History:  ?Past Surgical History:  ?Procedure Laterality Date  ? BREAST LUMPECTOMY  1992  ? CESAREAN SECTION    ? IR GASTROSTOMY TUBE MOD SED  03/06/2022  ? ORIF RADIAL FRACTURE  02/10/2012  ? Procedure: OPEN REDUCTION INTERNAL FIXATION (ORIF) RADIAL FRACTURE;  Surgeon: Cammy Copa, MD;  Location: [facility] OR;  Service: Orthopedics;  Laterality: Right;  ? ORIF ULNAR FRACTURE  02/10/2012  ? Procedure: OPEN REDUCTION INTERNAL FIXATION (ORIF) ULNAR FRACTURE;  Surgeon: Cammy Copa, MD;  Location: [facility] OR;  Service: Orthopedics;  Laterality: Right;  ? TUBAL LIGATION    ? ?Social History:  reports that she has never smoked. She has never used smokeless tobacco. She reports that she does not currently use alcohol. She reports that she does not use drugs. ? ?Family / Support Systems ?Marital Status: Single ?Patient Roles: Parent ?Children: [name] [phone]-cell lives with pt  Breosha-daughyer [phone]-cell-primary contact  Tineesha-daughter [phone]-cell ?Other Supports: Friends ?Anticipated Caregiver: Daughter's-[name] will be main caregiver and other two will assist PRN ?Ability/Limitations of Caregiver: Supervision-min assist ?Caregiver Availability: 24/7 ?Family Dynamics: Close with her three daughter's who will pull together to assist with her care. Pt is ready to go home after this long hospitalization. Her daughter's are her world ? 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 Daughter's will be in and out while here  ? ?Abuse/Neglect ?Abuse/Neglect Assessment Can Be Completed: Yes ?Physical Abuse: Denies ?Verbal Abuse: Denies ?Sexual Abuse: Denies ?Exploitation of patient/patient's resources: Denies ?Self-Neglect: Denies ? ?Patient response to: ?Social Isolation - How often do you feel lonely or isolated from those around you?: Never ? ?Emotional Status ?Pt's affect, behavior and adjustment status: Pt wants to do her rehab and get home, she has been hospitalized since 01/2022 and is tired of being in a hospital. She is motivated to do what she can and recover but wants to go home. She has always been independent and wants to get back to this level ?Recent Psychosocial Issues: other health issues ?Psychiatric History: No history due to long hospitalization would benefit from seeing neuro-psych while here for coping ?Substance Abuse History: No issues ? ?Patient / Family Perceptions, Expectations & Goals ?Pt/Family understanding of illness & functional limitations: Pt and daughter can explain her stroke and deficits. Pt is making progress and hopeful this will continue. Both talk with the MD and feel they have a good understanding of her treamtent plan moving forward. ?Premorbid pt/family roles/activities: Mom, employee, friend, etc ?Anticipated changes in roles/activities/participation: resume ?Pt/family expectations/goals: Pt states: " I want to go home but I will do rehab." [name] states: " I will be the one with her all of the time, my sister's will help also." ? ?Community Resources ?Community Agencies:  None ?Premorbid Home Care/DME Agencies: None ?Transportation available at discharge: Pt drove PTA-will need to rely upon daughter's now ?Is the patient able to respond to transportation needs?: Yes ?In the past 12 months, has lack of transportation kept you from medical appointments or from getting medications?: No ?In the past 12 months, has lack of transportation kept you from meetings, work, or from getting things needed for daily living?: No ?Resource referrals recommended: Neuropsychology ? ?Discharge Planning ?Living Arrangements: Children ?Support Systems: Children, Other relatives, Friends/neighbors ?Type of Residence: Private residence ?Insurance Resources: Media planner (specify) (Friday health) ?Financial Resources: Employment, Family Support ?Financial Screen Referred: Yes ?Living Expenses: Rent ?Money Management: Patient ?Does the patient have any problems obtaining your medications?: No ?Home Management: self and daughter's ?Patient/Family Preliminary Plans: Return home with daughter-[name] who will be assisting with her care. She plans to be here a lot so can see her progress and know the care she will require at home. Aware being evaluated today ad goals being set. ?Care Coordinator Barriers to Discharge: Insurance for SNF coverage ?Care Coordinator Anticipated Follow Up Needs: HH/OP ? ?Clinical Impression ?Pleasant female who has been through much since being hospitalized in 01/2022 after suffering a severe CVA. Her three daughter's are involved and supportive and will be assisting with her care at discharge. Will work on discharge needs and await team evaluations. May need to begin SSD application process if has not already started ? ?[name] ?04/05/2022, 9:54 AM ? ?  ?

## 2022-04-05 NOTE — Progress Notes (Signed)
Patient settling in after admit to unit yesterday. Daughter stayed overnight and participated in care. Patient SBP high and continues on hydralazine and carvedilol. Mood flat and hardly conversant. Denies pain or SOB. Medications crushed with applesauce and well tolerated. Lt arm flaccid, right arm and leg illicit purposeful movement. Denies pain. Toileted as needed. Call bell within reach of patient. Safety ensured. ?

## 2022-04-06 DIAGNOSIS — I619 Nontraumatic intracerebral hemorrhage, unspecified: Secondary | ICD-10-CM | POA: Diagnosis not present

## 2022-04-06 LAB — GLUCOSE, CAPILLARY
Glucose-Capillary: 115 mg/dL — ABNORMAL HIGH (ref 70–99)
Glucose-Capillary: 104 mg/dL — ABNORMAL HIGH (ref 70–99)
Glucose-Capillary: 99 mg/dL (ref 70–99)
Glucose-Capillary: 91 mg/dL (ref 70–99)

## 2022-04-06 LAB — BASIC METABOLIC PANEL
Anion gap: 8 (ref 5–15)
BUN: 12 mg/dL (ref 6–20)
CO2: 22 mmol/L (ref 22–32)
Calcium: 10 mg/dL (ref 8.9–10.3)
Chloride: 106 mmol/L (ref 98–111)
Creatinine, Ser: 0.85 mg/dL (ref 0.44–1.00)
GFR, Estimated: >60 mL/min (ref >60)
Glucose, Bld: 100 mg/dL — ABNORMAL HIGH (ref 70–99)
Potassium: 3.5 mmol/L (ref 3.5–5.1)
Sodium: 136 mmol/L (ref 135–145)

## 2022-04-06 MED ORDER — MAGNESIUM GLUCONATE 500 MG PO TABS
500.0000 mg | ORAL_TABLET | Freq: Every day | ORAL | Status: DC
  Administered 2022-04-06 – 2022-04-25 (×20): 500 mg via ORAL
  Filled 2022-04-06 (×20): qty 1

## 2022-04-06 MED ORDER — AMANTADINE HCL 50 MG/5ML PO SOLN
100.0000 mg | Freq: Every day | ORAL | Status: DC
  Administered 2022-04-06 – 2022-04-17 (×12): 100 mg via ORAL
  Filled 2022-04-06 (×12): qty 10

## 2022-04-06 NOTE — Progress Notes (Signed)
Physical Therapy Session Note ? ?Patient Details  ?Name: Sarah Myers ?MRN: [id number] ?Date of Birth: 08-29-1971 ? ?Today's Date: 04/06/2022 ?PT Individual Time: 1001-1056 ?PT Individual Time Calculation (min): 55 min  ? ?Short Term Goals: ?Week 1:  PT Short Term Goal 1 (Week 1): Pt will complete bed mobility with modA ?PT Short Term Goal 2 (Week 1): Pt will complete bed<>chair transfers with modA and LRAD ?PT Short Term Goal 3 (Week 1): Pt will ambulate 64f with maxA and LRAD ?PT Short Term Goal 4 (Week 1): Pt will initiate stair training ? ?Skilled Therapeutic Interventions/Progress Updates:  ?   ?Direct handoff of care from OT to start session - pt agreeable to PT tx and denies pain - flat affect and mildly lethargic. Daughter at bedside and we discussed bringing in proper shoewear (wide fitting tennis shoes) which she confirms she can do. Also discussed grounds pass and relayed to MD for order. Donned sweat pants with maxA - daughter assisting with pulling them up in standing while PT assisted with balance. She required modA for sit<>stand with L knee blocked and modA for squat<>pivot transfer towards her weaker L side from recliner to w/c. Transported to main rehab hallway to focus on gait training NMR using R hand rail and +2 assist for wheelchair follow. She ambulated 330f+ [id number] rest breaks) with mod/maxA. LLE wrapped for DF assist. 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 Completed stretching for heel cord and hamstring tone - messaged MD to order adjustable night splint to help with management. Pt returned to her room and assisted back to her recliner via squat<>pivot modA transfer. Concluded session seated in recliner with safety belt alarm on, pillows supporting, all needs met. Daughter at bedside.  ? ?Therapy Documentation ?Precautions:   ?Precautions ?Precautions: Fall ?Precaution Comments: L inattention, PEG, left hemi, right head gaze ?Restrictions ?Weight Bearing Restrictions: No ?General: ?  ? ?Therapy/Group: Individual Therapy ? ?Janes Colegrove P Miguelina Fore ?04/06/2022, 7:23 AM  ?

## 2022-04-06 NOTE — Progress Notes (Signed)
?                                                       PROGRESS NOTE ? ? ?Subjective/Complaints: ?No new complaints this morning ?Hand and shoulder are feeling better though she anticipates her shoulder will hurt more when she is working with therapy ? ?ROS: +constipation,+depression as per Raechel Ache OT ? ?Objective: ?  ?No results found. ?Recent Labs  ?  04/05/22 ?[id number]  ?WBC 6.1  ?HGB 13.1  ?HCT 39.1  ?PLT 222  ? ?Recent Labs  ?  04/05/22 ?[id number] 04/06/22 ?[id number]  ?NA 138 136  ?K 3.3* 3.5  ?CL 106 106  ?CO2 23 22  ?GLUCOSE 107* 100*  ?BUN 14 12  ?CREATININE 0.87 0.85  ?CALCIUM 10.0 10.0  ? ? ?Intake/Output Summary (Last 24 hours) at 04/06/2022 1128 ?Last data filed at 04/05/2022 1316 ?Gross per 24 hour  ?Intake 118 ml  ?Output --  ?Net 118 ml  ?  ? ?  ? ?Physical Exam: ?Vital Signs ?Blood pressure (!) 166/90, pulse 71, temperature 98.2 ?F (36.8 ?C), resp. rate 18, height 5\' 4"  (1.626 m), weight 60 kg, SpO2 100 %. ?Gen: no distress, normal appearing ?HEENT: oral mucosa pink and moist, NCAT ?Cardio: Reg rate ?Chest: normal effort, normal rate of breathing ?Abd: soft, non-distended ?Musculoskeletal:  ?   Cervical back: Neck supple. No tenderness.  ?   Comments: RUE- 4/5 or more- hard to test ?RLE 3+/5 proximally and 4-/5 distally ?LUE- 0/5 ?LLE- 2-/5 in HF and PF only ?  ?Skin: ?   General: Skin is warm and dry.  ?   Comments: Scab 1.5 cm on L forearm- otherwise skin looks OK  ?Neurological:  ?   Mental Status: She is alert and oriented to person, place, and time.  ?   Comments: Severe L inattention/neglect ?Intact to light touch in all 4 extremities ?Dysconjugate gaze, but denies double vision.  ?MAS of 2-3 in LUE_ shoulder MAS of 3; elbow 2-3 and MAS of 2 in L wrist ?MAS of 1+ to 2 in LLE ?Puffy L hand ?3 beats clonus LLE and (+) hoffman's LUE  ?Psychiatric:     ?   Mood and Affect: Depressed mood ?   Comments: vague  ? ? ?Assessment/Plan: ?1. Functional deficits which require 3+ hours per day of interdisciplinary  therapy in a comprehensive inpatient rehab setting. ?Physiatrist is providing close team supervision and 24 hour management of active medical problems listed below. ?Physiatrist and rehab team continue to assess barriers to discharge/monitor patient progress toward functional and medical goals ? ?Care Tool: ? ?Bathing ?   ?Body parts bathed by patient: Front perineal area, Buttocks  ? Body parts bathed by helper: Buttocks, Front perineal area ?  ?  ?Bathing assist Assist Level: Maximal Assistance - Patient 24 - 49% ?  ?  ?Upper Body Dressing/Undressing ?Upper body dressing   ?What is the patient wearing?: [place] only ?   ?Upper body assist Assist Level: Moderate Assistance - Patient 50 - 74% ?   ?Lower Body Dressing/Undressing ?Lower body dressing ? ? ?   ?What is the patient wearing?: [place] only ? ?  ? ?Lower body assist Assist for lower body dressing: Maximal Assistance - Patient 25 - 49% ?   ? ?Toileting ?Toileting    ?  Toileting assist Assist for toileting: Total Assistance - Patient < 25% ?  ?  ?Transfers ?Chair/bed transfer ? ?Transfers assist ?   ? ?Chair/bed transfer assist level: Maximal Assistance - Patient 25 - 49% ?  ?  ?Locomotion ?Ambulation ? ? ?Ambulation assist ? ?   ? ?Assist level: Moderate Assistance - Patient 50 - 74% ?Assistive device: Parallel bars ?Max distance: 55ft  ? ?Walk 10 feet activity ? ? ?Assist ? Walk 10 feet activity did not occur: Safety/medical concerns ? ?  ?   ? ?Walk 50 feet activity ? ? ?Assist Walk 50 feet with 2 turns activity did not occur: Safety/medical concerns ? ?  ?   ? ? ?Walk 150 feet activity ? ? ?Assist Walk 150 feet activity did not occur: Safety/medical concerns ? ?  ?  ?  ? ?Walk 10 feet on uneven surface  ?activity ? ? ?Assist Walk 10 feet on uneven surfaces activity did not occur: Safety/medical concerns ? ? ?  ?   ? ?Wheelchair ? ? ? ? ?Assist Is the patient using a wheelchair?: Yes ?Type of Wheelchair: Manual ?  ? ?Wheelchair assist level:  Dependent - Patient 0% ?Max wheelchair distance: 115ft  ? ? ?Wheelchair 50 feet with 2 turns activity ? ? ? ?Assist ? ?  ?  ? ? ?Assist Level: Dependent - Patient 0%  ? ?Wheelchair 150 feet activity  ? ? ? ?Assist ?   ? ? ?Assist Level: Dependent - Patient 0%  ? ?Blood pressure (!) 166/90, pulse 71, temperature 98.2 ?F (36.8 ?C), resp. rate 18, height 5\' 4"  (1.626 m), weight 60 kg, SpO2 100 %. ? ?Medical Problem List and Plan: ?1. 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            -patient may  shower- cover PEG ?            -ELOS/Goals: 14-16 days min A to supervision ? HFU scheduled ?2.  Antithrombotics: ?-DVT/anticoagulation:  Pharmaceutical: Other (comment)Eliquis ?            -antiplatelet therapy: none ?3. Pain Management: Tylenol prn, Robaxin and Neurotin scheduled, Lidoderm patch ?4. Mood: LCSW to evaluate and provide emotional support ?            --continue sertraline 50 mg daily ?            -antipsychotic agents: n/a ?5. Neuropsych: This patient is capable of making decisions on her own behalf. ?6. Skin/Wound Care: Routine skin care checks ?7. Fluids/Electrolytes/Nutrition: Routine Is and Os and follow-up chemistries ?8: Left-sided weakness: ?            --scheduled Baclofen for spasticity 5 mg TID ?            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 Increase magnesium gluconate to 500mg  HS ?12. Constipation: increase magnesium gluconate to 500mg  HS ?13. Hypokalemia: continue 73meq klor BID, repeat BMP tomorrow.  ?  ?  ? ?LOS: ?2 days ?A FACE TO FACE EVALUATION WAS PERFORMED ? ?Martha Clan P Vir Whetstine ?04/06/2022, 11:28 AM  ? ?  ?

## 2022-04-06 NOTE — Progress Notes (Signed)
Speech Language Pathology Daily Session Note ? ?Patient Details  ?Name: Sarah Myers ?MRN: [id number] ?Date of Birth: 18-Apr-1971 ? ?Today's Date: 04/06/2022 ?SLP Individual Time: 0700-0800 ?SLP Individual Time Calculation (min): 60 min ? ?Short Term Goals: ?Week 1: SLP Short Term Goal 1 (Week 1): Patient will consume current diet with minimal overt s/s of aspiration with supervision level verbal cues for use of swallowing compensatory strategies. ?SLP Short Term Goal 2 (Week 1): Patient will demonstrate efficient mastication and complete oral clearance with trials of Dys. 3 textures without overt s/s of aspiration with supervision level verbal cues over 2 sessions prior to upgrade. ?SLP Short Term Goal 3 (Week 1): Patient will demonstrate sustained attention to functional tasks for 10 minutes with Mod verbal cues for redirection. ?SLP Short Term Goal 4 (Week 1): Patient will demonstrate functional problem solving for basic and familiar tasks with mod verbal cues. ?SLP Short Term Goal 5 (Week 1): Patient will attend/scan to midline during functional tasks with Mod verbal cues. ? ?Skilled Therapeutic Interventions: Skilled treatment session focused on dysphagia and cognitive goals. Upon arrival, patient was awake in bed but appeared lethargic. SLP facilitated session by providing tray set-up with breakfast meal of Dys. 2 textures with thin liquids. Patient consumed meal without overt s/s of aspiration but required Mod verbal cues for use of swallowing compensatory strategies regarding self-monitoring and correcting left anterior spillage. Minimal PO intake noted despite encouragement. Recommend patient to continue current diet. Throughout self-feeding, SLP provided Mod verbal cues for patient to sustain attention to task due to verbosity. Patient also required Max multimodal cues to visually scan towards midline as she would use her hands to find items instead of attempting to search by moving here head and/or  eyes. Patient unable to verbalize any changes in vision but unable to identify any items like color of clinician's shirt, etc, appropriately.  Patient also unable to utilize a calendar efficiently at this time due to visual/scanning deficits. Patient left upright in bed with alarm on and all needs within reach. Continue with current plan of care.  ?   ? ?Pain ?No/Denies Pain  ? ?Therapy/Group: Individual Therapy ? ?Sarah Myers ?04/06/2022, 2:31 PM ?

## 2022-04-06 NOTE — Progress Notes (Signed)
Orthopedic Tech Progress Note ?Patient Details:  ?Sarah Myers ?26-Oct-1971 ?[id number] ? ?Patient ID: [name], female   DOB: 1971-02-03, 51 y.o.   MRN: [id number] ?I called order into [facility] ?Trinna Post ?04/06/2022, 11:48 AM ? ?

## 2022-04-07 DIAGNOSIS — I619 Nontraumatic intracerebral hemorrhage, unspecified: Secondary | ICD-10-CM | POA: Diagnosis not present

## 2022-04-07 LAB — BASIC METABOLIC PANEL
Anion gap: 7 (ref 5–15)
BUN: 12 mg/dL (ref 6–20)
CO2: 21 mmol/L — ABNORMAL LOW (ref 22–32)
Calcium: 10.3 mg/dL (ref 8.9–10.3)
Chloride: 111 mmol/L (ref 98–111)
Creatinine, Ser: 0.82 mg/dL (ref 0.44–1.00)
GFR, Estimated: >60 mL/min (ref >60)
Glucose, Bld: 103 mg/dL — ABNORMAL HIGH (ref 70–99)
Potassium: 3.9 mmol/L (ref 3.5–5.1)
Sodium: 139 mmol/L (ref 135–145)

## 2022-04-07 LAB — GLUCOSE, CAPILLARY
Glucose-Capillary: 103 mg/dL — ABNORMAL HIGH (ref 70–99)
Glucose-Capillary: 111 mg/dL — ABNORMAL HIGH (ref 70–99)
Glucose-Capillary: 85 mg/dL (ref 70–99)
Glucose-Capillary: 130 mg/dL — ABNORMAL HIGH (ref 70–99)

## 2022-04-07 MED ORDER — POTASSIUM CHLORIDE 20 MEQ PO PACK
20.0000 meq | PACK | Freq: Every day | ORAL | Status: DC
  Administered 2022-04-08 – 2022-04-19 (×12): 20 meq via ORAL
  Filled 2022-04-07 (×12): qty 1

## 2022-04-07 MED ORDER — LOSARTAN POTASSIUM 25 MG PO TABS
25.0000 mg | ORAL_TABLET | Freq: Every day | ORAL | Status: DC
  Administered 2022-04-07 – 2022-04-26 (×20): 25 mg via ORAL
  Filled 2022-04-07 (×20): qty 1

## 2022-04-07 NOTE — IPOC Note (Signed)
Overall Plan of Care (IPOC) ?Patient Details ?Name: Sarah Myers ?MRN: [id number] ?DOB: April 05, 1971 ? ?Admitting Diagnosis: ICH (intracerebral hemorrhage) (HCC) ? ?Hospital Problems: Principal Problem: ?  ICH (intracerebral hemorrhage) (HCC) ? ? ? ? Functional Problem List: ?Nursing Medication Management, Pain, Endurance, Nutrition  ?PT Balance, Endurance, Motor, Nutrition, Pain, Safety, Sensory, Perception, Skin Integrity  ?OT Balance, Perception, Safety, Cognition, Sensory, Edema, Skin Integrity, Endurance, Vision, Motor, Nutrition, Pain  ?SLP Cognition, Motor, Perception, Safety  ?TR    ?    ? Basic ADL?s: ?OT Eating, Grooming, Bathing, Dressing, Toileting  ? ?  Advanced  ADL?s: ?OT    ?   ?Transfers: ?PT Bed Mobility, Bed to Chair, Car  ?OT Toilet, Tub/Shower  ? ?  Locomotion: ?PT Ambulation, Wheelchair Mobility, Stairs  ? ?  Additional Impairments: ?OT Fuctional Use of Upper Extremity  ?SLP Swallowing ?  ?   ?TR    ? ? ?Anticipated Outcomes ?Item Anticipated Outcome  ?Self Feeding setup  ?Swallowing ? Mod I ?  ?Basic self-care ? min assist  ?Toileting ? min assist ?  ?Bathroom Transfers min assist  ?Bowel/Bladder ?    ?Transfers ? minA with LRAD  ?Locomotion ? minA with LRAD - anticipate wheelchair level  ?Communication ? Mod I  ?Cognition ? Supervision-Min A  ?Pain ? manage pain w prns at or below level 4  ?Safety/Judgment ? manage safety w cues  ? ?Therapy Plan: ?PT Intensity: Minimum of 1-2 x/day ,45 to 90 minutes ?PT Frequency: 5 out of 7 days ?PT Duration Estimated Length of Stay: 3-4 weeks ?OT Intensity: Minimum of 1-2 x/day, 45 to 90 minutes ?OT Frequency: 5 out of 7 days ?OT Duration/Estimated Length of Stay: 3-4 weeks ?SLP Intensity: Minumum of 1-2 x/day, 30 to 90 minutes ?SLP Frequency: 3 to 5 out of 7 days ?SLP Duration/Estimated Length of Stay: 3-4 weeks  ? ?Due to the current state of emergency, patients may not be receiving their 3-hours of Medicare-mandated therapy. ? ? Team  Interventions: ?Nursing Interventions Disease Management/Prevention, Medication Management, Discharge Planning, Pain Management, Dysphagia/Aspiration Precaution Training  ?PT interventions Ambulation/gait training, Warden/ranger, Skin care/wound management, Cognitive remediation/compensation, Community reintegration, Discharge planning, Disease management/prevention, DME/adaptive equipment instruction, Functional mobility training, Functional electrical stimulation, Neuromuscular re-education, Pain management, Patient/family education, Psychosocial support, Splinting/orthotics, Stair training, Therapeutic Activities, Therapeutic Exercise, UE/LE Strength taining/ROM, UE/LE Coordination activities, Visual/perceptual remediation/compensation, Wheelchair propulsion/positioning  ?OT Interventions Balance/vestibular training, Discharge planning, Functional electrical stimulation, Pain management, Self Care/advanced ADL retraining, Therapeutic Activities, UE/LE Coordination activities, Cognitive remediation/compensation, Disease mangement/prevention, Functional mobility training, Patient/family education, Skin care/wound managment, Therapeutic Exercise, Visual/perceptual remediation/compensation, Community reintegration, DME/adaptive equipment instruction, Neuromuscular re-education, Psychosocial support, Splinting/orthotics, UE/LE Strength taining/ROM, Wheelchair propulsion/positioning  ?SLP Interventions Cognitive remediation/compensation, Internal/external aids, Dysphagia/aspiration precaution training, Cueing hierarchy, Environmental controls, Therapeutic Activities, Functional tasks, Patient/family education, Speech/Language facilitation  ?TR Interventions    ?SW/CM Interventions Discharge Planning, Psychosocial Support, Patient/Family Education  ? ?Barriers to Discharge ?MD  Medical stability  ?Nursing Decreased caregiver support, Home environment access/layout ?2nd floor apt; flight up to entry left  rail w dtr  ?PT Insurance for SNF coverage, Home environment access/layout, Incontinence, Inaccessible home environment, Nutrition means ?   ?OT Inaccessible home environment, Incontinence, Nutrition means ?   ?SLP   ?   ?SW Insurance for SNF coverage ?   ? ?Team Discharge Planning: ?Destination: PT-Home ,OT- Home , SLP-Home ?Projected Follow-up: PT-Home health PT, 24 hour supervision/assistance, OT-  24 hour supervision/assistance, Home health OT, SLP-24 hour supervision/assistance, Outpatient SLP, Home  Health SLP ?Projected Equipment Needs: PT-To be determined, OT- To be determined, SLP-None recommended by SLP ?Equipment Details: PT- , OT-  ?Patient/family involved in discharge planning: PT- Patient, Family member/caregiver,  OT-Patient, Family member/caregiver, SLP-Family member/caregiver ? ?MD ELOS: 3-4 weeks ?Medical Rehab Prognosis:  Excellent ?Assessment: The patient has been admitted for CIR therapies with the diagnosis of right basal ganglia CVA. The team will be addressing functional mobility, strength, stamina, balance, safety, adaptive techniques and equipment, self-care, bowel and bladder mgt, patient and caregiver education. Goals have been set at 14-16 days MinA/S. Anticipated discharge destination is home.  ? ? ?See Team Conference Notes for weekly updates to the plan of care  ?

## 2022-04-07 NOTE — Progress Notes (Signed)
Occupational Therapy Session Note ? ?Patient Details  ?Name: Sarah Myers ?MRN: [id number] ?Date of Birth: September 30, 1971 ? ?Today's Date: 04/06/2022 ?OT Individual Time:  3220-2542 ? OT Individual Time Calculation (min): 45 min  ? ? ?Short Term Goals: ?Week 1:  OT Short Term Goal 1 (Week 1): Pt will complete UB dressing with min assist ?OT Short Term Goal 2 (Week 1): Pt will complete squat pivot BSC transfer with mod assist ?OT Short Term Goal 3 (Week 1): Pt will complete LB dressing with mod assist ?OT Short Term Goal 4 (Week 1): Pt will complete oral hygiene with setup and min VCs for visual scanning and item retrieval. ? ?Skilled Therapeutic Interventions/Progress Updates:  ?  Pt semi reclined in bed, daughter at bedside, pt has no c/o pain throughout session.  Agreeable to bathing at shower level.  Pt completed supine to sit with increased time using bed features with CGA.  Stedy transfer with CGA for sit<>stand and total assist for transport to shower bench.  Pt doffed shirt with min assist and brief with mod assist.  Bathed UB/LB using long handled sponge with min assist for buttocks and light manual assist to abduct left shoulder.  After drying off, pt transported to w/c using stedy again with CGA for sit<>stand and balance.  Pt donned shirt with min assist.  Donned brief and pants with max assist.  Call bell in reach, seat alarm on at end of session.   ? ?Therapy Documentation ?Precautions:  ?Precautions ?Precautions: Fall ?Precaution Comments: L inattention, PEG, left hemi, right head gaze ?Restrictions ?Weight Bearing Restrictions: No ? ? ? ?Therapy/Group: Individual Therapy ? ?[name] Chantilly Linskey ?04/07/2022, 12:36 PM ?

## 2022-04-07 NOTE — Plan of Care (Signed)
?  Problem: Consults ?Goal: RH STROKE PATIENT EDUCATION ?Description: See Patient Education module for education specifics  ?Outcome: Progressing ?  ?Problem: RH SKIN INTEGRITY ?Goal: RH STG MAINTAIN SKIN INTEGRITY WITH ASSISTANCE ?Description: STG Maintain Skin Integrity With min Assistance. ?Outcome: Progressing ?Goal: RH STG ABLE TO PERFORM INCISION/WOUND CARE W/ASSISTANCE ?Description: STG Able To Perform Incision/Wound Care With min Assistance. ?Outcome: Progressing ?  ?Problem: RH SAFETY ?Goal: RH STG ADHERE TO SAFETY PRECAUTIONS W/ASSISTANCE/DEVICE ?Description: STG Adhere to Safety Precautions With cues Assistance/Device. ?Outcome: Progressing ?  ?Problem: RH PAIN MANAGEMENT ?Goal: RH STG PAIN MANAGED AT OR BELOW PT'S PAIN GOAL ?Description: At or below level 4 w prns ?Outcome: Progressing ?  ?Problem: RH KNOWLEDGE DEFICIT ?Goal: RH STG INCREASE KNOWLEDGE OF HYPERTENSION ?Description: Patient and family will be able to manage HTN with medications and dietary modifications using handouts and educational resources independently ?Outcome: Progressing ?Goal: RH STG INCREASE KNOWLEDGE OF DYSPHAGIA/FLUID INTAKE ?Description: Patient and family will be able to manage HLD with medications and dietary modifications using handouts and educational resources independently ?Outcome: Progressing ?Goal: RH STG INCREASE KNOWLEDGE OF STROKE PROPHYLAXIS ?Description: Patient and family will be able to manage stroke risks with medications and dietary modifications using handouts and educational resources independently ?Outcome: Progressing ?  ?

## 2022-04-07 NOTE — Progress Notes (Signed)
This chaplain responded to the PA's request for spiritual care in the setting of major life transition and request for prayer. ? ?The Pt. is sitting up in the bedside recliner at the time of the visit and willing to participate in story telling with the chaplain. The chaplain listened reflectively as the Pt. storytelling created tears and smiles for the Pt.  ? ?The chaplain understands the Pt. goal is to return to the home she shares with her uncle and sister. The Pt. describes the relationship as the perfect combination of companionship and solitude. The chaplain learned the love the Pt. carries with her is a from life with her grandfather and God. The chaplain understands the Pt. forgives herself and is hopeful for what the future may bring.  ? ?The chaplain blessed the Pt. prayer and extended and invitation for F/U spiritual care which the Pt. accepted.  ? ?Chaplain Stephanie Acre ?[phone] ?

## 2022-04-07 NOTE — Progress Notes (Signed)
Speech Language Pathology Daily Session Note ? ?Patient Details  ?Name: Sarah Myers ?MRN: [id number] ?Date of Birth: [birth year] ? ?Today's Date: 04/07/2022 ?SLP Individual Time: 7741-2878 ?SLP Individual Time Calculation (min): 50 min and Today's Date: 04/07/2022 ?SLP Missed Time: 10 Minutes ?Missed Time Reason:  (Chaplin visit per patient request) ? ?Short Term Goals: ?Week 1: SLP Short Term Goal 1 (Week 1): Patient will consume current diet with minimal overt s/s of aspiration with supervision level verbal cues for use of swallowing compensatory strategies. ?SLP Short Term Goal 2 (Week 1): Patient will demonstrate efficient mastication and complete oral clearance with trials of Dys. 3 textures without overt s/s of aspiration with supervision level verbal cues over 2 sessions prior to upgrade. ?SLP Short Term Goal 3 (Week 1): Patient will demonstrate sustained attention to functional tasks for 10 minutes with Mod verbal cues for redirection. ?SLP Short Term Goal 4 (Week 1): Patient will demonstrate functional problem solving for basic and familiar tasks with mod verbal cues. ?SLP Short Term Goal 5 (Week 1): Patient will attend/scan to midline during functional tasks with Mod verbal cues. ? ?Skilled Therapeutic Interventions: Skilled treatment session focused on cognitive goals. Upon arrival, patient was visiting with the chaplin and both the chaplin and patient requested extended time. After completion of chaplin visit, patient appeared lethargic while upright in the recliner. SLP facilitated by administering the [facility] Mental Status Examination (SLUMS). Patient scored  9/30 points with a score of 27 or above considered normal.  Patient demonstrated deficits in attention, short-term recall, problem solving and visual perceptual tasks. SLP also facilitated session by providing Max A multimodal cues for patient to utilize visual and verbal cues for visual scanning towards midline to locate  functional items and in attempt to utilize a calendar to maximize recall of orientation information. Without visual aids, patient required Max A multimodal cues for orientation to date. Patient left upright in recliner with alarm on and all needs within reach. Continue with current plan of care.  ? ?   ? ?Pain ?Pain Assessment ?Pain Scale: 0-10 ?Pain Score: 0-No pain ? ?Therapy/Group: Individual Therapy ? ?Sarah Myers ?04/07/2022, 12:54 PM ?

## 2022-04-07 NOTE — Progress Notes (Signed)
Orthopedic Tech Progress Note ?Patient Details:  ?Sarah Myers ?July 24, 1971 ?[id number] ? ?Patient ID: [name], female   DOB: 12/16/1971, 51 y.o.   MRN: [id number] ?Order routed over to [facility]. ? ?[place] ?04/07/2022, 12:54 PM ? ?

## 2022-04-07 NOTE — Progress Notes (Signed)
Physical Therapy Session Note ? ?Patient Details  ?Name: Sarah Myers ?MRN: [id number] ?Date of Birth: [birth year] ? ?Today's Date: 04/07/2022 ?PT Individual Time: 1100-1200 ?PT Individual Time Calculation (min): 60 min  ? ?Short Term Goals: ?Week 1:  PT Short Term Goal 1 (Week 1): Pt will complete bed mobility with modA ?PT Short Term Goal 2 (Week 1): Pt will complete bed<>chair transfers with modA and LRAD ?PT Short Term Goal 3 (Week 1): Pt will ambulate 23f with maxA and LRAD ?PT Short Term Goal 4 (Week 1): Pt will initiate stair training ? ?Skilled Therapeutic Interventions/Progress Updates:  ?   ?Pt sitting in recliner to start session - agreeable to PT tx, appearing lethargic and denies pain. Completed squat<>pivot transfer with modA towards her L side from recliner to w/c and required assist for repositioning in the chair. L knee blocked during transfer for safety. ? ? Transported to day room rehab gym for time management and squat<>pivot transfer to Nustep in similar manner. Required assist for setup on Nustep with L hemibody. Nustep activity completed for NMR purposes for neural priming, R/L dissociation, L attention. Pt struggled with greatly with this activity, with resistance set to 1 - required assist for L knee positioning due to lack of internal rotator strength to keep LLE in neutral position. She also was unable to motor plan L vs R movement of LLE during activity and despite PT providing AAROM for Nustep activity, she continued to struggle. Completed activity for 8 minutes.  ? ?Assisted back to w/c and then to mat table with modA squat<>pivot transfer with L knee blocked. [facility] on tasks to promote visual scanning, L attention, motor planning, and problem solving. Primary task including clothes pin to bar while seated to limit distractions and dual-tasking - even with isolating task, pt struggled greatly with visual acuity for locating objects and was ~50% accurate with naming  colors of objects. When attempting to locate objects, she relies primarily on her hands more than visual pursuit, still questioning vision deficits. ? ?Returned to her room and completed modA squat<>pivot transfer back to recliner (placed wheelchair cushion in recliner to improve comfort). Pillows also placed for support. Safety belt alarm on, all needs met, call bell in lap.  ? ?Therapy Documentation ?Precautions:  ?Precautions ?Precautions: Fall ?Precaution Comments: L inattention, PEG, left hemi, right head gaze ?Restrictions ?Weight Bearing Restrictions: No ?General: ?  ? ?Therapy/Group: Individual Therapy ? ?CAlger SimonsPT ?04/07/2022, 7:32 AM  ?

## 2022-04-07 NOTE — Progress Notes (Signed)
?                                                       PROGRESS NOTE ? ? ?Subjective/Complaints: ?No new complaints this morning ?Resting hand splint ordered ?Had uplifting conversation with chaplain ?Shoulder pain is improved ? ?ROS: +constipation,+situational depression, shoulder pain has resolved ? ?Objective: ?  ?No results found. ?Recent Labs  ?  04/05/22 ?6468  ?WBC 6.1  ?HGB 13.1  ?HCT 39.1  ?PLT 222  ? ?Recent Labs  ?  04/06/22 ?0321 04/07/22 ?2248  ?NA 136 139  ?K 3.5 3.9  ?CL 106 111  ?CO2 22 21*  ?GLUCOSE 100* 103*  ?BUN 12 12  ?CREATININE 0.85 0.82  ?CALCIUM 10.0 10.3  ? ? ?Intake/Output Summary (Last 24 hours) at 04/07/2022 1214 ?Last data filed at 04/07/2022 0530 ?Gross per 24 hour  ?Intake --  ?Output 1 ml  ?Net -1 ml  ?  ? ?  ? ?Physical Exam: ?Vital Signs ?Blood pressure (!) 146/76, pulse 66, temperature 98.2 ?F (36.8 ?C), temperature source Oral, resp. rate 18, height 5\' 4"  (1.626 m), weight 60 kg, SpO2 98 %. ?Gen: no distress, normal appearing ?HEENT: oral mucosa pink and moist, NCAT, unable to identify colors ?Cardio: Reg rate ?Chest: normal effort, normal rate of breathing ?Abd: soft, non-distended ?Musculoskeletal:  ?   Cervical back: Neck supple. No tenderness.  ?   Comments: RUE- 4/5 or more- hard to test ?RLE 3+/5 proximally and 4-/5 distally ?LUE- 0/5 ?LLE- 2-/5 in HF and PF only ?  ?Skin: ?   General: Skin is warm and dry.  ?   Comments: Scab 1.5 cm on L forearm- otherwise skin looks OK  ?Neurological:  ?   Mental Status: She is alert and oriented to person, place, and time.  ?   Comments: Severe L inattention/neglect ?Intact to light touch in all 4 extremities ?Dysconjugate gaze, but denies double vision.  ?MAS of 2-3 in LUE_ shoulder MAS of 3; elbow 2-3 and MAS of 2 in L wrist ?MAS of 1+ to 2 in LLE ?Puffy L hand ?3 beats clonus LLE and (+) hoffman's LUE  ?Psychiatric:     ?   Mood and Affect: Depressed mood ?   Comments: vague  ? ? ?Assessment/Plan: ?1. Functional deficits which require  3+ hours per day of interdisciplinary therapy in a comprehensive inpatient rehab setting. ?Physiatrist is providing close team supervision and 24 hour management of active medical problems listed below. ?Physiatrist and rehab team continue to assess barriers to discharge/monitor patient progress toward functional and medical goals ? ?Care Tool: ? ?Bathing ?   ?Body parts bathed by patient: Front perineal area, Buttocks  ? Body parts bathed by helper: Buttocks, Front perineal area ?  ?  ?Bathing assist Assist Level: Maximal Assistance - Patient 24 - 49% ?  ?  ?Upper Body Dressing/Undressing ?Upper body dressing   ?What is the patient wearing?: Hospital gown only ?   ?Upper body assist Assist Level: Moderate Assistance - Patient 50 - 74% ?   ?Lower Body Dressing/Undressing ?Lower body dressing ? ? ?   ?What is the patient wearing?: Hospital gown only ? ?  ? ?Lower body assist Assist for lower body dressing: Maximal Assistance - Patient 25 - 49% ?   ? ?Toileting ?Toileting    ?  Toileting assist Assist for toileting: Total Assistance - Patient < 25% ?  ?  ?Transfers ?Chair/bed transfer ? ?Transfers assist ?   ? ?Chair/bed transfer assist level: Maximal Assistance - Patient 25 - 49% ?  ?  ?Locomotion ?Ambulation ? ? ?Ambulation assist ? ?   ? ?Assist level: Moderate Assistance - Patient 50 - 74% ?Assistive device: Parallel bars ?Max distance: 82ft  ? ?Walk 10 feet activity ? ? ?Assist ? Walk 10 feet activity did not occur: Safety/medical concerns ? ?  ?   ? ?Walk 50 feet activity ? ? ?Assist Walk 50 feet with 2 turns activity did not occur: Safety/medical concerns ? ?  ?   ? ? ?Walk 150 feet activity ? ? ?Assist Walk 150 feet activity did not occur: Safety/medical concerns ? ?  ?  ?  ? ?Walk 10 feet on uneven surface  ?activity ? ? ?Assist Walk 10 feet on uneven surfaces activity did not occur: Safety/medical concerns ? ? ?  ?   ? ?Wheelchair ? ? ? ? ?Assist Is the patient using a wheelchair?: Yes ?Type of Wheelchair:  Manual ?  ? ?Wheelchair assist level: Dependent - Patient 0% ?Max wheelchair distance: 131ft  ? ? ?Wheelchair 50 feet with 2 turns activity ? ? ? ?Assist ? ?  ?  ? ? ?Assist Level: Dependent - Patient 0%  ? ?Wheelchair 150 feet activity  ? ? ? ?Assist ?   ? ? ?Assist Level: Dependent - Patient 0%  ? ?Blood pressure (!) 146/76, pulse 66, temperature 98.2 ?F (36.8 ?C), temperature source Oral, resp. rate 18, height 5\' 4"  (1.626 m), weight 60 kg, SpO2 98 %. ? ?Medical Problem List and Plan: ?1. 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            -patient may  shower- cover PEG ?            -ELOS/Goals: 14-16 days min A to supervision ? HFU scheduled ? Continue CIR ?2.  Antithrombotics: ?-DVT/anticoagulation:  Pharmaceutical: Other (comment)Eliquis ?            -antiplatelet therapy: none ?3. Pain Management: Tylenol prn, Robaxin and Neurotin scheduled, Lidoderm patch ?4. Mood: LCSW to evaluate and provide emotional support ?            --continue sertraline 50 mg daily ?            -antipsychotic agents: n/a ?5. Neuropsych: This patient is capable of making decisions on her own behalf. ?6. Skin/Wound Care: Routine skin care checks ?7. Fluids/Electrolytes/Nutrition: Routine Is and Os and follow-up chemistries ?8: Left-sided weakness: ?            --scheduled Baclofen for spasticity 5 mg TID ?            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 Increase magnesium gluconate to 500mg  HS. Restart Cozaar 25mg  daily.  ?12. Constipation: increase magnesium gluconate to 500mg  HS. Provided with list of high fiber foods ?13. Hypokalemia: decrease klor to 20mg  daily.  ?  ?  ? ?LOS: ?3 days ?A FACE TO FACE EVALUATION WAS PERFORMED ? ?04/23/2022 P Gaudencio Chesnut ?04/07/2022, 12:14 PM  ? ?  ?

## 2022-04-07 NOTE — Progress Notes (Signed)
Occupational Therapy Session Note ? ?Patient Details  ?Name: Sarah Myers ?MRN: [id number] ?Date of Birth: 07-30-1971 ? ?Today's Date: 04/07/2022 ?OT Individual Time: 2025-4270 ?OT Individual Time Calculation (min): 60 min  ? ? ?Short Term Goals: ?Week 1:  OT Short Term Goal 1 (Week 1): Pt will complete UB dressing with min assist ?OT Short Term Goal 2 (Week 1): Pt will complete squat pivot BSC transfer with mod assist ?OT Short Term Goal 3 (Week 1): Pt will complete LB dressing with mod assist ?OT Short Term Goal 4 (Week 1): Pt will complete oral hygiene with setup and min VCs for visual scanning and item retrieval. ? ?Skilled Therapeutic Interventions/Progress Updates:  ?  Pt semi reclined in bed requesting to bathe and dress at EOB.  Pt completed supine to sit using bed features with supervision. Brushed teeth with min assist to apply toothbrush onto toothpaste using hand over hand of left dominant. Pt doffed shirt with min assist.  Doffed brief and pants at sit<>stand level with mod assist.  Pt bathed UB and LB with mod assist and hand over hand left to wash right arm.  Pt donned shirt with min assist to initiate hemi technique.  Donned brief with max assist and pants with mod assist.  Squat pivot EOB to recliner with max assist.  Mod assist to reposition pelvis further back in chair.  Breakfast tray setup and pt required supervision with mod cues to scan plate and turn head toward left to locate food items.  Call bell in reach, seat alarm on, LUE elevated with pillow. ? ?Therapy Documentation ?Precautions:  ?Precautions ?Precautions: Fall ?Precaution Comments: L inattention, PEG, left hemi, right head gaze ?Restrictions ?Weight Bearing Restrictions: No ? ? ? ?Therapy/Group: Individual Therapy ? ?[name] [name] ?04/07/2022, 12:59 PM ?

## 2022-04-08 DIAGNOSIS — I619 Nontraumatic intracerebral hemorrhage, unspecified: Secondary | ICD-10-CM | POA: Diagnosis not present

## 2022-04-08 LAB — GLUCOSE, CAPILLARY
Glucose-Capillary: 120 mg/dL — ABNORMAL HIGH (ref 70–99)
Glucose-Capillary: 148 mg/dL — ABNORMAL HIGH (ref 70–99)
Glucose-Capillary: 95 mg/dL (ref 70–99)
Glucose-Capillary: 91 mg/dL (ref 70–99)

## 2022-04-08 NOTE — Plan of Care (Signed)
?  Problem: Consults ?Goal: RH STROKE PATIENT EDUCATION ?Description: See Patient Education module for education specifics  ?Outcome: Progressing ?  ?Problem: RH SKIN INTEGRITY ?Goal: RH STG MAINTAIN SKIN INTEGRITY WITH ASSISTANCE ?Description: STG Maintain Skin Integrity With min Assistance. ?Outcome: Progressing ?Goal: RH STG ABLE TO PERFORM INCISION/WOUND CARE W/ASSISTANCE ?Description: STG Able To Perform Incision/Wound Care With min Assistance. ?Outcome: Progressing ?  ?Problem: RH SAFETY ?Goal: RH STG ADHERE TO SAFETY PRECAUTIONS W/ASSISTANCE/DEVICE ?Description: STG Adhere to Safety Precautions With cues Assistance/Device. ?Outcome: Progressing ?  ?Problem: RH PAIN MANAGEMENT ?Goal: RH STG PAIN MANAGED AT OR BELOW PT'S PAIN GOAL ?Description: At or below level 4 w prns ?Outcome: Progressing ?  ?Problem: RH KNOWLEDGE DEFICIT ?Goal: RH STG INCREASE KNOWLEDGE OF HYPERTENSION ?Description: Patient and family will be able to manage HTN with medications and dietary modifications using handouts and educational resources independently ?Outcome: Progressing ?Goal: RH STG INCREASE KNOWLEDGE OF DYSPHAGIA/FLUID INTAKE ?Description: Patient and family will be able to manage HLD with medications and dietary modifications using handouts and educational resources independently ?Outcome: Progressing ?Goal: RH STG INCREASE KNOWLEDGE OF STROKE PROPHYLAXIS ?Description: Patient and family will be able to manage stroke risks with medications and dietary modifications using handouts and educational resources independently ?Outcome: Progressing ?  ?

## 2022-04-08 NOTE — Progress Notes (Signed)
?                                                       PROGRESS NOTE ? ? ?Subjective/Complaints: ? ?Pt reports no issues-  ?Slept OK- denies pain today- ?Doesn't remember when had LBM. Said "it's regular'- had one last evening.  ? ? ? ?ROS:  ?Pt denies SOB, abd pain, CP, N/V/C/D, and vision changes ? ?Objective: ?  ?No results found. ?No results for input(s): WBC, HGB, HCT, PLT in the last 72 hours. ? ?Recent Labs  ?  04/06/22 ?[id number] 04/07/22 ?[id number]  ?NA 136 139  ?K 3.5 3.9  ?CL 106 111  ?CO2 22 21*  ?GLUCOSE 100* 103*  ?BUN 12 12  ?CREATININE 0.85 0.82  ?CALCIUM 10.0 10.3  ? ? ?Intake/Output Summary (Last 24 hours) at 04/08/2022 1113 ?Last data filed at 04/07/2022 1422 ?Gross per 24 hour  ?Intake 100 ml  ?Output --  ?Net 100 ml  ?  ? ?  ? ?Physical Exam: ?Vital Signs ?Blood pressure (!) 145/78, pulse 64, temperature 98.2 ?F (36.8 ?C), resp. rate 14, height 5\' 4"  (1.626 m), weight 60 kg, SpO2 99 %. ? ? ? ?General: awake, alert, appropriate, sitting up in bed- leaning to Left;  NAD ?HENT: conjugate gaze; oropharynx moist ?CV: regular rate; no JVD ?Pulmonary: CTA B/L; no W/R/R- good air movement ?GI: soft, NT, ND, (+)BS ?Psychiatric: appropriate- very very flat ?Neurological: L head tilt/leaning to L- L inattention ?Musculoskeletal:  ?   Cervical back: Neck supple. No tenderness.  ?   Comments: RUE- 4/5 or more- hard to test ?RLE 3+/5 proximally and 4-/5 distally ?LUE- 0/5 ?LLE- 2-/5 in HF and PF only ?  ?Skin: ?   General: Skin is warm and dry.  ?   Comments: Scab 1.5 cm on L forearm- otherwise skin looks OK  ?Neurological:  ?   Mental Status: She is alert and oriented to person, place, and time.  ?   Comments: Severe L inattention/neglect ?Intact to light touch in all 4 extremities ?Dysconjugate gaze, but denies double vision.  ?MAS of 2-3 in LUE_ shoulder MAS of 3; elbow 2-3 and MAS of 2 in L wrist ?MAS of 1+ to 2 in LLE ?Puffy L hand ?3 beats clonus LLE and (+) hoffman's LUE  ?Psychiatric:     ?   Mood and Affect:  Depressed mood ?   Comments: vague  ? ? ?Assessment/Plan: ?1. Functional deficits which require 3+ hours per day of interdisciplinary therapy in a comprehensive inpatient rehab setting. ?Physiatrist is providing close team supervision and 24 hour management of active medical problems listed below. ?Physiatrist and rehab team continue to assess barriers to discharge/monitor patient progress toward functional and medical goals ? ?Care Tool: ? ?Bathing ?   ?Body parts bathed by patient: Front perineal area, Buttocks  ? Body parts bathed by helper: Buttocks, Front perineal area ?  ?  ?Bathing assist Assist Level: Maximal Assistance - Patient 24 - 49% ?  ?  ?Upper Body Dressing/Undressing ?Upper body dressing   ?What is the patient wearing?: [place] only ?   ?Upper body assist Assist Level: Moderate Assistance - Patient 50 - 74% ?   ?Lower Body Dressing/Undressing ?Lower body dressing ? ? ?   ?What is the patient wearing?: [place] only ? ?  ? ?  Lower body assist Assist for lower body dressing: Maximal Assistance - Patient 25 - 49% ?   ? ?Toileting ?Toileting    ?Toileting assist Assist for toileting: Total Assistance - Patient < 25% ?  ?  ?Transfers ?Chair/bed transfer ? ?Transfers assist ?   ? ?Chair/bed transfer assist level: Maximal Assistance - Patient 25 - 49% ?  ?  ?Locomotion ?Ambulation ? ? ?Ambulation assist ? ?   ? ?Assist level: Moderate Assistance - Patient 50 - 74% ?Assistive device: Parallel bars ?Max distance: 82ft  ? ?Walk 10 feet activity ? ? ?Assist ? Walk 10 feet activity did not occur: Safety/medical concerns ? ?  ?   ? ?Walk 50 feet activity ? ? ?Assist Walk 50 feet with 2 turns activity did not occur: Safety/medical concerns ? ?  ?   ? ? ?Walk 150 feet activity ? ? ?Assist Walk 150 feet activity did not occur: Safety/medical concerns ? ?  ?  ?  ? ?Walk 10 feet on uneven surface  ?activity ? ? ?Assist Walk 10 feet on uneven surfaces activity did not occur: Safety/medical concerns ? ? ?  ?    ? ?Wheelchair ? ? ? ? ?Assist Is the patient using a wheelchair?: Yes ?Type of Wheelchair: Manual ?  ? ?Wheelchair assist level: Dependent - Patient 0% ?Max wheelchair distance: 120ft  ? ? ?Wheelchair 50 feet with 2 turns activity ? ? ? ?Assist ? ?  ?  ? ? ?Assist Level: Dependent - Patient 0%  ? ?Wheelchair 150 feet activity  ? ? ? ?Assist ?   ? ? ?Assist Level: Dependent - Patient 0%  ? ?Blood pressure (!) 145/78, pulse 64, temperature 98.2 ?F (36.8 ?C), resp. rate 14, height 5\' 4"  (1.626 m), weight 60 kg, SpO2 99 %. ? ?Medical Problem List and Plan: ?1. 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            -patient may  shower- cover PEG ?            -ELOS/Goals: 14-16 days min A to supervision ? HFU scheduled ? Continue CIR- PT, OT and SLP-  ?2.  Antithrombotics: ?-DVT/anticoagulation:  Pharmaceutical: Other (comment)Eliquis ?            -antiplatelet therapy: none ?3. Pain Management: Tylenol prn, Robaxin and Neurotin scheduled, Lidoderm patch ? 4/22- pain controlled- con't regimen ?4. Mood: LCSW to evaluate and provide emotional support ?            --continue sertraline 50 mg daily ?            -antipsychotic agents: n/a ?5. Neuropsych: This patient is capable of making decisions on her own behalf. ?6. Skin/Wound Care: Routine skin care checks ?7. Fluids/Electrolytes/Nutrition: Routine Is and Os and follow-up chemistries ?8: Left-sided weakness: ?            --scheduled Baclofen for spasticity 5 mg TID ?            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 Increase magnesium gluconate to 500mg  HS. Restart Cozaar 25mg  daily.  ?12. Constipation: increase magnesium gluconate to 500mg  HS. Provided with list of high fiber foods ?13. Hypokalemia: decrease klor to 20mg  daily.  ? 4/22- Labs Monday ?  ?  ? ?LOS: ?4 days ?A FACE TO FACE EVALUATION  WAS PERFORMED ? ?Sarah Myers ?04/08/2022, 11:13 AM  ? ?  ?

## 2022-04-08 NOTE — Progress Notes (Signed)
Physical Therapy Session Note ? ?Patient Details  ?Name: Sarah Myers ?MRN: [id number] ?Date of Birth: 04/29/1971 ? ?Today's Date: 04/08/2022 ?PT Individual Time: 2202-5427 ?PT Individual Time Calculation (min): 42 min  ? ?Short Term Goals: ?Week 1:  PT Short Term Goal 1 (Week 1): Pt will complete bed mobility with modA ?PT Short Term Goal 2 (Week 1): Pt will complete bed<>chair transfers with modA and LRAD ?PT Short Term Goal 3 (Week 1): Pt will ambulate 64ft with maxA and LRAD ?PT Short Term Goal 4 (Week 1): Pt will initiate stair training ? ?Skilled Therapeutic Interventions/Progress Updates:  ?Pt was seen bedside in the pm. Pt rolled L with side rail and min A. Pt rolled R with side rail and mod to max A. Pt transferred to edge of bed with mod to max A. Pt tolerated edge of bed with min A. Pt transferred edge of bed to w/c with mod to max A and verbal cues. Pt transported to gym. Pt performed block sit to stand transfers in the gym, 3 sets x 5 reps each with min to mod A and verbal cues. Pt transferred sit to stand with min to mod A x1, while standing worked on standing tolerance and weight shifting. Pt returned to room. Pt transferred w/c to recliner with mod to max A. Pt left sitting up in recliner with chair alarm and all needs within reach.  ? ?Therapy Documentation ?Precautions:  ?Precautions ?Precautions: Fall ?Precaution Comments: L inattention, PEG, left hemi, right head gaze ?Restrictions ?Weight Bearing Restrictions: No ?General: ? ?Pain: ?No c/o pain.  ? ?Therapy/Group: Individual Therapy ? ?Rayford Halsted ?04/08/2022, 3:31 PM  ?

## 2022-04-09 DIAGNOSIS — I619 Nontraumatic intracerebral hemorrhage, unspecified: Secondary | ICD-10-CM | POA: Diagnosis not present

## 2022-04-09 LAB — GLUCOSE, CAPILLARY
Glucose-Capillary: 148 mg/dL — ABNORMAL HIGH (ref 70–99)
Glucose-Capillary: 90 mg/dL (ref 70–99)
Glucose-Capillary: 94 mg/dL (ref 70–99)
Glucose-Capillary: 105 mg/dL — ABNORMAL HIGH (ref 70–99)

## 2022-04-09 NOTE — Plan of Care (Signed)
?  Problem: Consults ?Goal: RH STROKE PATIENT EDUCATION ?Description: See Patient Education module for education specifics  ?Outcome: Progressing ?  ?Problem: RH SKIN INTEGRITY ?Goal: RH STG MAINTAIN SKIN INTEGRITY WITH ASSISTANCE ?Description: STG Maintain Skin Integrity With min Assistance. ?Outcome: Progressing ?Goal: RH STG ABLE TO PERFORM INCISION/WOUND CARE W/ASSISTANCE ?Description: STG Able To Perform Incision/Wound Care With min Assistance. ?Outcome: Progressing ?  ?Problem: RH SAFETY ?Goal: RH STG ADHERE TO SAFETY PRECAUTIONS W/ASSISTANCE/DEVICE ?Description: STG Adhere to Safety Precautions With cues Assistance/Device. ?Outcome: Progressing ?  ?Problem: RH PAIN MANAGEMENT ?Goal: RH STG PAIN MANAGED AT OR BELOW PT'S PAIN GOAL ?Description: At or below level 4 w prns ?Outcome: Progressing ?  ?Problem: RH KNOWLEDGE DEFICIT ?Goal: RH STG INCREASE KNOWLEDGE OF HYPERTENSION ?Description: Patient and family will be able to manage HTN with medications and dietary modifications using handouts and educational resources independently ?Outcome: Progressing ?Goal: RH STG INCREASE KNOWLEDGE OF DYSPHAGIA/FLUID INTAKE ?Description: Patient and family will be able to manage HLD with medications and dietary modifications using handouts and educational resources independently ?Outcome: Progressing ?Goal: RH STG INCREASE KNOWLEDGE OF STROKE PROPHYLAXIS ?Description: Patient and family will be able to manage stroke risks with medications and dietary modifications using handouts and educational resources independently ?Outcome: Progressing ?  ?

## 2022-04-09 NOTE — Progress Notes (Addendum)
Occupational Therapy Session Note ? ?Patient Details  ?Name: Sarah Myers ?MRN: [id number] ?Date of Birth: 11-Mar-1971 ? ?Today's Date: 04/09/2022 ?OT Individual Time: 2992-4268 ?OT Individual Time Calculation (min): 57 min  ? ? ?Short Term Goals: ?Week 1:  OT Short Term Goal 1 (Week 1): Pt will complete UB dressing with min assist ?OT Short Term Goal 2 (Week 1): Pt will complete squat pivot BSC transfer with mod assist ?OT Short Term Goal 3 (Week 1): Pt will complete LB dressing with mod assist ?OT Short Term Goal 4 (Week 1): Pt will complete oral hygiene with setup and min VCs for visual scanning and item retrieval. ? ? ?Skilled Therapeutic Interventions/Progress Updates:  ?  Pt greeted at time of session bed level resting with daughter present who remained throughout session and provided guidance to assist with pt preferences. No pain reported by pt. Pt wanting to change clothes and wash up at this time, bed mobility with MOD A overall for BLE management and rolling to come to sit EOB. Pt with R lean and L gaze preference, needing frequent cues throughout session to return to midline. Focused on seated UB bathing and dressing with hemitechniques and using hand over hand for LUE involvement when possible. Donning new personal gown with MAX A with hemitechnique, pt recalling to place LUE in shirt first. Sit > stand at EOB with MOD of 1 and L knee block to assist with doffing pants and pt states she is "too hot." Scooting toward [facility] with MOD A, sit > supine same manner. Scooting up in bed with MOD of 1. LUE elevated and daughter asking for a sign to go over bed for staff to don splints, provided with one. OT facilitating AAROM/PROM for LUE at shoulder for flexion/extension, shoulder circles, elbow flexion/extension with tapping and weight bearing to promote return, minimal activation noted. Alarm on call bell in reach.  ? ?Therapy Documentation ?Precautions:  ?Precautions ?Precautions: Fall ?Precaution Comments: L  inattention, PEG, left hemi, right head gaze ?Restrictions ?Weight Bearing Restrictions: No ? ? ? ? ?Therapy/Group: Individual Therapy ? ?Erasmo Score ?04/09/2022, 7:20 AM ?

## 2022-04-09 NOTE — Progress Notes (Signed)
Physical Therapy Session Note ? ?Patient Details  ?Name: Sarah Myers ?MRN: [id number] ?Date of Birth: Oct 27, 1971 ? ?Today's Date: 04/09/2022 ?PT Individual Time: 0800-0900 ?PT Individual Time Calculation (min): 60 min  ? ?Short Term Goals: ?Week 1:  PT Short Term Goal 1 (Week 1): Pt will complete bed mobility with modA ?PT Short Term Goal 2 (Week 1): Pt will complete bed<>chair transfers with modA and LRAD ?PT Short Term Goal 3 (Week 1): Pt will ambulate 69f with maxA and LRAD ?PT Short Term Goal 4 (Week 1): Pt will initiate stair training ? ?Skilled Therapeutic Interventions/Progress Updates:  ?   ?Pt supine in bed to start with daughter resting in recliner. LPN delivering morning rx. Pt agreeable to PT tx and denies pain. She had her WHO on her L arm but not the adjustable night splint on LLE - discussed with daughter and pt importance of wearing it at night to assist with preserving ankle ROM and managing tone. Printed sign to hang above to bed to assist with nursing follow through. Pt lacking ~10deg to neutral in ankle DF on L.  ? ?Assisted with bed level dressing maxA for time management. Rolling in bed with modA and supine<>sitting EOB with HOB flat and use of bedrails, modA for trunk and BLE management. She requires modA for initial siting balance progressing to CGA with cues and time acclimation to upright. Donned tennis shoes with totalA and then completed squat<>pivot transfer with modA from EOB to her stronger R side, L knee blocked for safety.  ? ?Transported to main rehab gym and setup at high/low table. Instructed pt in activity to target visual scanning, standing balance, LLE weight bearing - pt completed push pins to colored targets with modA for standing balance with L knee blocked while unsupported - pt required max verbal and instructional cues for target accuracy and pt unable to verbalize colored shapes (star, square, etc). She was also 50% accurate with naming colors. Still continue to  question visual deficits. Standing balance is poor with L lean and delayed righting responses.  ? ?In // bars, we worked on sit<>stands 1x5 by pushing from arm rests and L knee blocked, min/modA for powering to rise and emphasis on forward weight shift and activating posterior chain. Pt fatigues quickly and requires encouragement to complete a set of 5 reps.  ? ?Returned to her room in w/c and pt requesting to return to bed due to fatigue. Completed modA squat<>pivot transfer to her L side. Mod/maxA needed for sit>supine for trunk and BLE management via log rolling technique. Made comfortable in the bed and remained semi-reclined with bed alarm on, all needs met. Daughter at bedside who was updated on pt's progress.  ? ? ? ?Therapy Documentation ?Precautions:  ?Precautions ?Precautions: Fall ?Precaution Comments: L inattention, PEG, left hemi, right head gaze ?Restrictions ?Weight Bearing Restrictions: No ?General: ? ? ?Therapy/Group: Individual Therapy ? ?Bronte Kropf P Samaria Anes ?04/09/2022, 7:26 AM  ?

## 2022-04-09 NOTE — Progress Notes (Signed)
?                                                       PROGRESS NOTE ? ? ?Subjective/Complaints: ? ?Pt reports no issues- slept OK ?LBM this AM per daughter' who's at bedside.  ?Pt reports shoulder pain much better- still wearing lidoderm patch from last night- asked daughter to get nursing to remove.  ? ? ?ROS:  ? ?Pt denies SOB, abd pain, CP, N/V/C/D, and vision changes ? ?Objective: ?  ?No results found. ?No results for input(s): WBC, HGB, HCT, PLT in the last 72 hours. ? ?Recent Labs  ?  04/07/22 ?5170  ?NA 139  ?K 3.9  ?CL 111  ?CO2 21*  ?GLUCOSE 103*  ?BUN 12  ?CREATININE 0.82  ?CALCIUM 10.3  ? ? ?Intake/Output Summary (Last 24 hours) at 04/09/2022 1353 ?Last data filed at 04/08/2022 2000 ?Gross per 24 hour  ?Intake 60 ml  ?Output --  ?Net 60 ml  ?  ? ?  ? ?Physical Exam: ?Vital Signs ?Blood pressure 128/76, pulse 63, temperature 98.1 ?F (36.7 ?C), resp. rate 18, height 5\' 4"  (1.626 m), weight 60 kg, SpO2 100 %. ? ? ? ? ?General: awake, alert, appropriate,sitting up in bed; leaning to left; daughter at bedside;  NAD ?HENT: conjugate gaze; oropharynx moist ?CV: regular rate; no JVD ?Pulmonary: CTA B/L; no W/R/R- good air movement ?GI: soft, NT, ND, (+)BS ?Psychiatric: appropriate- but flat ?Neurological: L head tilt/leaning to L- L inattention- no change ?Musculoskeletal: Patch on L shoulder ?   Cervical back: Neck supple. No tenderness.  ?   Comments: RUE- 4/5 or more- hard to test ?RLE 3+/5 proximally and 4-/5 distally ?LUE- 0/5 ?LLE- 2-/5 in HF and PF only ?  ?Skin: ?   General: Skin is warm and dry.  ?   Comments: Scab 1.5 cm on L forearm- otherwise skin looks OK  ?Neurological:  ?   Mental Status: She is alert and oriented to person, place, and time.  ?   Comments: Severe L inattention/neglect ?Intact to light touch in all 4 extremities ?Dysconjugate gaze, but denies double vision.  ?MAS of 2-3 in LUE_ shoulder MAS of 3; elbow 2-3 and MAS of 2 in L wrist ?MAS of 1+ to 2 in LLE ?Puffy L hand ?3 beats clonus  LLE and (+) hoffman's LUE  ?Psychiatric:     ?   Mood and Affect: Depressed mood ?   Comments: vague  ? ? ?Assessment/Plan: ?1. Functional deficits which require 3+ hours per day of interdisciplinary therapy in a comprehensive inpatient rehab setting. ?Physiatrist is providing close team supervision and 24 hour management of active medical problems listed below. ?Physiatrist and rehab team continue to assess barriers to discharge/monitor patient progress toward functional and medical goals ? ?Care Tool: ? ?Bathing ?   ?Body parts bathed by patient: Front perineal area, Buttocks  ? Body parts bathed by helper: Buttocks, Front perineal area ?  ?  ?Bathing assist Assist Level: Maximal Assistance - Patient 24 - 49% ?  ?  ?Upper Body Dressing/Undressing ?Upper body dressing   ?What is the patient wearing?: Hospital gown only ?   ?Upper body assist Assist Level: Moderate Assistance - Patient 50 - 74% ?   ?Lower Body Dressing/Undressing ?Lower body dressing ? ? ?   ?What  is the patient wearing?: Hospital gown only ? ?  ? ?Lower body assist Assist for lower body dressing: Maximal Assistance - Patient 25 - 49% ?   ? ?Toileting ?Toileting    ?Toileting assist Assist for toileting: Total Assistance - Patient < 25% ?  ?  ?Transfers ?Chair/bed transfer ? ?Transfers assist ?   ? ?Chair/bed transfer assist level: Moderate Assistance - Patient 50 - 74% ?  ?  ?Locomotion ?Ambulation ? ? ?Ambulation assist ? ?   ? ?Assist level: Moderate Assistance - Patient 50 - 74% ?Assistive device: Parallel bars ?Max distance: 51ft  ? ?Walk 10 feet activity ? ? ?Assist ? Walk 10 feet activity did not occur: Safety/medical concerns ? ?  ?   ? ?Walk 50 feet activity ? ? ?Assist Walk 50 feet with 2 turns activity did not occur: Safety/medical concerns ? ?  ?   ? ? ?Walk 150 feet activity ? ? ?Assist Walk 150 feet activity did not occur: Safety/medical concerns ? ?  ?  ?  ? ?Walk 10 feet on uneven surface  ?activity ? ? ?Assist Walk 10 feet on uneven  surfaces activity did not occur: Safety/medical concerns ? ? ?  ?   ? ?Wheelchair ? ? ? ? ?Assist Is the patient using a wheelchair?: Yes ?Type of Wheelchair: Manual ?  ? ?Wheelchair assist level: Dependent - Patient 0% ?Max wheelchair distance: 17ft  ? ? ?Wheelchair 50 feet with 2 turns activity ? ? ? ?Assist ? ?  ?  ? ? ?Assist Level: Dependent - Patient 0%  ? ?Wheelchair 150 feet activity  ? ? ? ?Assist ?   ? ? ?Assist Level: Dependent - Patient 0%  ? ?Blood pressure 128/76, pulse 63, temperature 98.1 ?F (36.7 ?C), resp. rate 18, height 5\' 4"  (1.626 m), weight 60 kg, SpO2 100 %. ? ?Medical Problem List and Plan: ?1. 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            -patient may  shower- cover PEG ?            -ELOS/Goals: 14-16 days min A to supervision ? HFU scheduled ? Continue CIR- PT, OT and SLP ?2.  Antithrombotics: ?-DVT/anticoagulation:  Pharmaceutical: Other (comment)Eliquis ?            -antiplatelet therapy: none ?3. Pain Management: Tylenol prn, Robaxin and Neurotin scheduled, Lidoderm patch ? 4/23- shoulder pain controlled with meds- con't regimen and have nursing remove patch in AM's.  ?4. Mood: LCSW to evaluate and provide emotional support ?            --continue sertraline 50 mg daily ?            -antipsychotic agents: n/a ?5. Neuropsych: This patient is capable of making decisions on her own behalf. ?6. Skin/Wound Care: Routine skin care checks ?7. Fluids/Electrolytes/Nutrition: Routine Is and Os and follow-up chemistries ?8: Left-sided weakness: ?            --scheduled Baclofen for spasticity 5 mg TID ?            --schedule Neurontin for hyperesthesia/pain LUE ? 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 Increase magnesium gluconate to 500mg  HS. Restart Cozaar 25mg  daily. ?4/23- BP controlled- con't regimen  ?12. Constipation:  increase magnesium gluconate to 500mg  HS. Provided with list of high fiber foods ? 4/23- LBM this  AM ?13. Hypokalemia: decrease klor to 20mg  daily.  ? 4/22- Labs Monday ?  ?  ? ?LOS: ?5 days ?A FACE TO FACE EVALUATION WAS PERFORMED ? ?Lukis Bunt ?04/09/2022, 1:53 PM  ? ?  ?

## 2022-04-09 NOTE — Progress Notes (Signed)
Speech Language Pathology Daily Session Note ? ?Patient Details  ?Name: Sarah Myers ?MRN: [id number] ?Date of Birth: [birth year] ? ?Today's Date: 04/09/2022 ?SLP Individual Time: 1335-1430 ?SLP Individual Time Calculation (min): 55 min ? ?Short Term Goals: ?Week 1: SLP Short Term Goal 1 (Week 1): Patient will consume current diet with minimal overt s/s of aspiration with supervision level verbal cues for use of swallowing compensatory strategies. ?SLP Short Term Goal 2 (Week 1): Patient will demonstrate efficient mastication and complete oral clearance with trials of Dys. 3 textures without overt s/s of aspiration with supervision level verbal cues over 2 sessions prior to upgrade. ?SLP Short Term Goal 3 (Week 1): Patient will demonstrate sustained attention to functional tasks for 10 minutes with Mod verbal cues for redirection. ?SLP Short Term Goal 4 (Week 1): Patient will demonstrate functional problem solving for basic and familiar tasks with mod verbal cues. ?SLP Short Term Goal 5 (Week 1): Patient will attend/scan to midline during functional tasks with Mod verbal cues. ? ?Skilled Therapeutic Interventions: ? Pt was seen for skilled ST targeting cognitive goals.  Upon arrival, pt was seated upright in bed, awake, lethargic, but agreeable to participating in treatment.  Pt's daughter was at bedside.  SLP facilitated the session with a basic card game to address problem solving and visual scanning goals; however, after multiple attempts at decreasing task complexity to accommodate for visual deficits pt continued to need max to total assist to complete task and it was ultimately abandoned to minimize pt frustration.  Visual deficits were partly related to decreased scanning to the left of midline but pt also presented with what appeared to be decreased visual acuity and would often grasp for things multiple times before actually touching them.  Pt would also reach for things without looking directly at them  and needed mod-max verbal cues to direct her gaze to objects on her table tray.  Additionally, pt had difficulty identifying colors and shapes even in high contrast presentations in L>R visual field.  SLP then facilitated the session with basic pipe tree tasks to address visual scanning and basic problem solving.  Pt needed mod assist to recreate basic structures out of PVC pipe from pictures, even when necessary pieces were provided to pt.  Pt was left in bed with bed alarm set and call bell within reach, daughter at bedside.  Continue per current plan of care.   ? ?Pain ?Pain Assessment ?Pain Scale: 0-10 ?Pain Score: 0-No pain ? ?Therapy/Group: Individual Therapy ? ?Zona Pedro, Melanee Spry ?04/09/2022, 4:00 PM ?

## 2022-04-10 DIAGNOSIS — I619 Nontraumatic intracerebral hemorrhage, unspecified: Secondary | ICD-10-CM | POA: Diagnosis not present

## 2022-04-10 LAB — GLUCOSE, CAPILLARY
Glucose-Capillary: 102 mg/dL — ABNORMAL HIGH (ref 70–99)
Glucose-Capillary: 100 mg/dL — ABNORMAL HIGH (ref 70–99)
Glucose-Capillary: 112 mg/dL — ABNORMAL HIGH (ref 70–99)
Glucose-Capillary: 114 mg/dL — ABNORMAL HIGH (ref 70–99)
Glucose-Capillary: 88 mg/dL (ref 70–99)

## 2022-04-10 LAB — BASIC METABOLIC PANEL
Anion gap: 8 (ref 5–15)
BUN: 10 mg/dL (ref 6–20)
CO2: 21 mmol/L — ABNORMAL LOW (ref 22–32)
Calcium: 10.6 mg/dL — ABNORMAL HIGH (ref 8.9–10.3)
Chloride: 108 mmol/L (ref 98–111)
Creatinine, Ser: 0.94 mg/dL (ref 0.44–1.00)
GFR, Estimated: >60 mL/min (ref >60)
Glucose, Bld: 110 mg/dL — ABNORMAL HIGH (ref 70–99)
Potassium: 3.8 mmol/L (ref 3.5–5.1)
Sodium: 137 mmol/L (ref 135–145)

## 2022-04-10 LAB — CBC
HCT: 39.6 % (ref 36.0–46.0)
Hemoglobin: 13 g/dL (ref 12.0–15.0)
MCH: 28.7 pg (ref 26.0–34.0)
MCHC: 32.8 g/dL (ref 30.0–36.0)
MCV: 87.4 fL (ref 80.0–100.0)
Platelets: 187 10*3/uL (ref 150–400)
RBC: 4.53 MIL/uL (ref 3.87–5.11)
RDW: 13.5 % (ref 11.5–15.5)
WBC: 6.4 10*3/uL (ref 4.0–10.5)
nRBC: 0 % (ref 0.0–0.2)

## 2022-04-10 NOTE — Progress Notes (Signed)
?                                                       PROGRESS NOTE ? ? ?Subjective/Complaints: ?No new complaints this morning ?Her pain in her hand and shoulder are gone.  ?BP better controlled.  ?Started on Zoloft- she says mood feels down when her daughter leaves ? ?ROS:  ? ?Pt denies SOB, abd pain, CP, N/V/C/D, and vision changes, pain in hand and shoulder are gone ? ?Objective: ?  ?No results found. ?Recent Labs  ?  04/10/22 ?0541  ?WBC 6.4  ?HGB 13.0  ?HCT 39.6  ?PLT 187  ? ? ?Recent Labs  ?  04/10/22 ?0541  ?NA 137  ?K 3.8  ?CL 108  ?CO2 21*  ?GLUCOSE 110*  ?BUN 10  ?CREATININE 0.94  ?CALCIUM 10.6*  ? ? ?Intake/Output Summary (Last 24 hours) at 04/10/2022 1007 ?Last data filed at 04/10/2022 0808 ?Gross per 24 hour  ?Intake 240 ml  ?Output 2 ml  ?Net 238 ml  ?  ? ?  ? ?Physical Exam: ?Vital Signs ?Blood pressure 135/69, pulse 62, temperature 98 ?F (36.7 ?C), temperature source Oral, resp. rate 16, height 5\' 4"  (1.626 m), weight 60 kg, SpO2 98 %. ? ?General: awake, alert, appropriate,sitting up in bed; leaning to left; BMI 22.71 ?HENT: conjugate gaze; oropharynx moist ?CV: regular rate; no JVD ?Pulmonary: CTA B/L; no W/R/R- good air movement ?GI: soft, NT, ND, (+)BS ?Psychiatric: appropriate- but flat ?Neurological: L head tilt/leaning to L- L inattention- no change ?Musculoskeletal: Patch on L shoulder ?   Cervical back: Neck supple. No tenderness.  ?   Comments: RUE- 4/5 or more- hard to test ?RLE 3+/5 proximally and 4-/5 distally ?LUE- 0/5 ?LLE- 2-/5 in HF and PF only ?  ?Skin: ?   General: Skin is warm and dry.  ?   Comments: Scab 1.5 cm on L forearm- otherwise skin looks OK  ?Neurological:  ?   Mental Status: She is alert and oriented to person, place, and time.  ?   Comments: Severe L inattention/neglect ?Intact to light touch in all 4 extremities ?Dysconjugate gaze, but denies double vision.  ?MAS of 2-3 in LUE_ shoulder MAS of 3; elbow 2-3 and MAS of 2 in L wrist ?MAS of 1+ to 2 in LLE ?Puffy L  hand ?3 beats clonus LLE and (+) hoffman's LUE  ?Psychiatric:     ?   Mood and Affect: Depressed mood ?   Comments: vague  ? ? ?Assessment/Plan: ?1. Functional deficits which require 3+ hours per day of interdisciplinary therapy in a comprehensive inpatient rehab setting. ?Physiatrist is providing close team supervision and 24 hour management of active medical problems listed below. ?Physiatrist and rehab team continue to assess barriers to discharge/monitor patient progress toward functional and medical goals ? ?Care Tool: ? ?Bathing ?   ?Body parts bathed by patient: Front perineal area, Buttocks  ? Body parts bathed by helper: Buttocks, Front perineal area ?  ?  ?Bathing assist Assist Level: Maximal Assistance - Patient 24 - 49% ?  ?  ?Upper Body Dressing/Undressing ?Upper body dressing   ?What is the patient wearing?: Hospital gown only ?   ?Upper body assist Assist Level: Moderate Assistance - Patient 50 - 74% ?   ?Lower Body Dressing/Undressing ?Lower body dressing ? ? ?   ?  What is the patient wearing?: Hospital gown only ? ?  ? ?Lower body assist Assist for lower body dressing: Maximal Assistance - Patient 25 - 49% ?   ? ?Toileting ?Toileting    ?Toileting assist Assist for toileting: Total Assistance - Patient < 25% ?  ?  ?Transfers ?Chair/bed transfer ? ?Transfers assist ?   ? ?Chair/bed transfer assist level: Moderate Assistance - Patient 50 - 74% ?  ?  ?Locomotion ?Ambulation ? ? ?Ambulation assist ? ?   ? ?Assist level: Moderate Assistance - Patient 50 - 74% ?Assistive device: Parallel bars ?Max distance: 9ft  ? ?Walk 10 feet activity ? ? ?Assist ? Walk 10 feet activity did not occur: Safety/medical concerns ? ?  ?   ? ?Walk 50 feet activity ? ? ?Assist Walk 50 feet with 2 turns activity did not occur: Safety/medical concerns ? ?  ?   ? ? ?Walk 150 feet activity ? ? ?Assist Walk 150 feet activity did not occur: Safety/medical concerns ? ?  ?  ?  ? ?Walk 10 feet on uneven surface  ?activity ? ? ?Assist  Walk 10 feet on uneven surfaces activity did not occur: Safety/medical concerns ? ? ?  ?   ? ?Wheelchair ? ? ? ? ?Assist Is the patient using a wheelchair?: Yes ?Type of Wheelchair: Manual ?  ? ?Wheelchair assist level: Dependent - Patient 0% ?Max wheelchair distance: 117ft  ? ? ?Wheelchair 50 feet with 2 turns activity ? ? ? ?Assist ? ?  ?  ? ? ?Assist Level: Dependent - Patient 0%  ? ?Wheelchair 150 feet activity  ? ? ? ?Assist ?   ? ? ?Assist Level: Dependent - Patient 0%  ? ?Blood pressure 135/69, pulse 62, temperature 98 ?F (36.7 ?C), temperature source Oral, resp. rate 16, height 5\' 4"  (1.626 m), weight 60 kg, SpO2 98 %. ? ?Medical Problem List and Plan: ?1. 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            -patient may  shower- cover PEG ?            -ELOS/Goals: 14-16 days min A to supervision ? HFU scheduled ? Continue CIR- PT, OT and SLP ?2.  Antithrombotics: ?-DVT/anticoagulation:  Pharmaceutical: Other (comment)Eliquis ?            -antiplatelet therapy: none ?3. Pain Management: Tylenol prn, Robaxin and Neurotin scheduled, Lidoderm patch ? 4/23- shoulder pain controlled with meds- con't regimen and have nursing remove patch in AM's.  ?4. Mood: LCSW to evaluate and provide emotional support ?            --continue sertraline 50 mg daily ?            -antipsychotic agents: n/a ?5. Neuropsych: This patient is capable of making decisions on her own behalf. ?6. Skin/Wound Care: Routine skin care checks ?7. Fluids/Electrolytes/Nutrition: Routine Is and Os and follow-up chemistries ?8: Left-sided weakness: ?            --scheduled Baclofen for spasticity 5 mg TID ?            --schedule Neurontin for hyperesthesia/pain LUE ? 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 Increase magnesium gluconate to 500mg  HS. Continue Cozaar 25mg  daily. ?12.  Constipation: increase magnesium gluconate to 500mg  HS. Provided with list of high fiber foods ?13. Hypokalemia: decrease klor to 20mg   daily. K+ 3.8, continue this dose.  ?  ?  ? ?LOS: ?6 days ?A FACE TO FACE EVALUATION WAS PERFORMED ? ?Clint Bolder P Michah Minton ?04/10/2022, 10:07 AM  ? ?  ?

## 2022-04-10 NOTE — Discharge Instructions (Addendum)
Inpatient Rehab Discharge Instructions ? ?Sarah Myers ?Discharge date and time: 04/26/2022 ? ?Activities/Precautions/ Functional Status: ?Activity: no lifting, driving, or strenuous exercise for until cleared by MD ?Diet: cardiac diet ?Wound Care: none needed ?Functional status:  ?___ No restrictions     ___ Walk up steps independently ?_x__ 24/7 supervision/assistance   ___ Walk up steps with assistance ?___ Intermittent supervision/assistance  ___ Bathe/dress independently ?___ Walk with walker     ___ Bathe/dress with assistance ?___ Walk Independently    ___ Shower independently ?___ Walk with assistance    __x_ Shower with assistance ?_x__ No alcohol     ___ Return to work/school ________ ? ?Special Instructions: ? ?Foods should be well cooked and large pieces cut into bite-sized pieces. ? ?No driving, alcohol consumption or tobacco use.  ? ? ?COMMUNITY REFERRALS UPON DISCHARGE:   ? ?Home Health:   PT  OT  SP   RN   ?               Agency:[facility] Phone: [phone]  ? ? ?Medical Equipment/Items Ordered:WHEELCHAIR, DROP-ARM BEDSIDE COMMODE AND HOSPITAL BED ?                                                Agency/Supplier:[facility]  [phone]  WILL GET TUB BENCH ON OWN ? ? ? ?STROKE/TIA DISCHARGE INSTRUCTIONS ?SMOKING Cigarette smoking nearly doubles your risk of having a stroke & is the single most alterable risk factor  ?If you smoke or have smoked in the last 12 months, you are advised to quit smoking for your health. Most of the excess cardiovascular risk related to smoking disappears within a year of stopping. ?Ask you doctor about anti-smoking medications ?[place] Quit Line: 1-800-QUIT NOW ?Free Smoking Cessation Classes [phone]  ?CHOLESTEROL Know your levels; limit fat & cholesterol in your diet  ?Lipid Panel  ?   ?Component Value Date/Time  ? CHOL 146 02/06/2022 0432  ? CHOL 169 10/30/2019 1136  ? TRIG 297 (H) 02/12/2022 0507  ? HDL 46 02/06/2022 0432  ? HDL 62 10/30/2019 1136   ? CHOLHDL 3.2 02/06/2022 0432  ? VLDL 36 02/06/2022 0432  ? LDLCALC 64 02/06/2022 0432  ? LDLCALC 84 10/30/2019 1136  ? ? ? Many patients benefit from treatment even if their cholesterol is at goal. ?Goal: Total Cholesterol (CHOL) less than 160 ?Goal:  Triglycerides (TRIG) less than 150 ?Goal:  HDL greater than 40 ?Goal:  LDL (LDLCALC) less than 100 ?  ?BLOOD PRESSURE American Stroke Association blood pressure target is less that 120/80 mm/Hg  ?Your discharge blood pressure is:  BP: 140/78 Monitor your blood pressure ?Limit your salt and alcohol intake ?Many individuals will require more than one medication for high blood pressure  ?DIABETES (A1c is a blood sugar average for last 3 months) Goal HGBA1c is under 7% (HBGA1c is blood sugar average for last 3 months)  ?Diabetes: ?No known diagnosis of diabetes   ? ?Lab Results  ?Component Value Date  ? HGBA1C 5.6 02/06/2022  ? ? Your HGBA1c can be lowered with medications, healthy diet, and exercise. ?Check your blood sugar as directed by your physician ?Call your physician if you experience unexplained or low blood sugars.  ?PHYSICAL ACTIVITY/REHABILITATION Goal is 30 minutes at least 4 days per week  ?Activity: Increase activity slowly, ?Therapies: Physical Therapy: Home  Health, Occupational Therapy: Home Health, and Speech Therapy: Home Health ?Return to work: n/a Activity decreases your risk of heart attack and stroke and makes your heart stronger.  It helps control your weight and blood pressure; helps you relax and can improve your mood. ?Participate in a regular exercise program. ?Talk with your doctor about the best form of exercise for you (dancing, walking, swimming, cycling).  ?DIET/WEIGHT Goal is to maintain a healthy weight  ?Your discharge diet is:  ?Diet Order   ? ?       ?  DIET DYS 3 Room service appropriate? Yes; Fluid consistency: Thin  Diet effective now       ?  ? ?  ?  ? ?  ? thin liquids ?Your height is:  Height: 5\' 4"  (162.6 cm) ?Your current  weight is: Weight: 66 kg ?Your Body Mass Index (BMI) is:  BMI (Calculated): 24.96 Following the type of diet specifically designed for you will help prevent another stroke. ?Your goal weight range is:   ?Your goal Body Mass Index (BMI) is 19-24. ?Healthy food habits can help reduce 3 risk factors for stroke:  High cholesterol, hypertension, and excess weight.  ?RESOURCES Stroke/Support Group:  Call [phone] ?  ?STROKE EDUCATION PROVIDED/REVIEWED AND GIVEN TO PATIENT Stroke warning signs and symptoms ?How to activate emergency medical system (call 911). ?Medications prescribed at discharge. ?Need for follow-up after discharge. ?Personal risk factors for stroke. ?Pneumonia vaccine given: No ?Flu vaccine given: No ?My questions have been answered, the writing is legible, and I understand these instructions.  I will adhere to these goals & educational materials that have been provided to me after my discharge from the hospital.  ? ?  ? ?My questions have been answered and I understand these instructions. I will adhere to these goals and the provided educational materials after my discharge from the hospital. ? ?Patient/Caregiver Signature _______________________________ Date __________ ? ?Clinician Signature _______________________________________ Date __________ ? ?Please bring this form and your medication list with you to all your follow-up doctor's appointments.   ?

## 2022-04-10 NOTE — Progress Notes (Signed)
Physical Therapy Session Note ? ?Patient Details  ?Name: Sarah Myers ?MRN: [id number] ?Date of Birth: 02-Feb-1971 ? ?Today's Date: 04/10/2022 ?PT Individual Time: 1000-1024 ?PT Individual Time Calculation (min): 24 min  and Today's Date: 04/10/2022 ?PT Missed Time: 36 Minutes ?Missed Time Reason: Patient fatigue;Patient unwilling to participate ? ?Short Term Goals: ?Week 1:  PT Short Term Goal 1 (Week 1): Pt will complete bed mobility with modA ?PT Short Term Goal 2 (Week 1): Pt will complete bed<>chair transfers with modA and LRAD ?PT Short Term Goal 3 (Week 1): Pt will ambulate 50ft with maxA and LRAD ?PT Short Term Goal 4 (Week 1): Pt will initiate stair training ? ?Skilled Therapeutic Interventions/Progress Updates:  ?   ?Pt supine in bed resting at start of session. She awakens to voice but required max cues to locate this therapist who was positioned on her L side. She's unable to sustain L gaze >10 seconds with instruction. Pt reporting fatigue and requesting to defer therapy. She also requests to defer therapy due to being cold in the rehab gym - offered jacket/blanket and to make sure she was comfortable but she continued to refuse, stating that she would get sick if she's too cold. Explained to her the purposes of CIR therapies and 3hours/day of rehab to progress functional mobility. She was somewhat difficult to reason with but agreeable to mobilize OOB to chair only. Supine<>sitting EOB with HOB raised and minA for LLE management - reliant on RUE for pulling herself to forward scoot to EOB. Squat<>pivot transfer with modA from EOB to recliner, towards weaker L side. Therapeutically positioned in recliner with pillows for postural support and L sided weakness. Call bell in lap, personal items in reach, safety belt alarm on. She missed 36 minutes due to fatigue and unwillingness to participate - will attempt to make up time as schedule allows.  ? ?Therapy Documentation ?Precautions:   ?Precautions ?Precautions: Fall ?Precaution Comments: L inattention, PEG, left hemi, right head gaze ?Restrictions ?Weight Bearing Restrictions: No ?General: ?  ? ?Therapy/Group: Individual Therapy ? ?Deshunda Thackston P Conswella Bruney ?04/10/2022, 7:25 AM  ?

## 2022-04-10 NOTE — Plan of Care (Signed)
?  Problem: Consults ?Goal: RH STROKE PATIENT EDUCATION ?Description: See Patient Education module for education specifics  ?Outcome: Progressing ?  ?Problem: RH SKIN INTEGRITY ?Goal: RH STG MAINTAIN SKIN INTEGRITY WITH ASSISTANCE ?Description: STG Maintain Skin Integrity With min Assistance. ?Outcome: Progressing ?Goal: RH STG ABLE TO PERFORM INCISION/WOUND CARE W/ASSISTANCE ?Description: STG Able To Perform Incision/Wound Care With min Assistance. ?Outcome: Progressing ?  ?Problem: RH SAFETY ?Goal: RH STG ADHERE TO SAFETY PRECAUTIONS W/ASSISTANCE/DEVICE ?Description: STG Adhere to Safety Precautions With cues Assistance/Device. ?Outcome: Progressing ?  ?Problem: RH PAIN MANAGEMENT ?Goal: RH STG PAIN MANAGED AT OR BELOW PT'S PAIN GOAL ?Description: At or below level 4 w prns ?Outcome: Progressing ?  ?Problem: RH KNOWLEDGE DEFICIT ?Goal: RH STG INCREASE KNOWLEDGE OF HYPERTENSION ?Description: Patient and family will be able to manage HTN with medications and dietary modifications using handouts and educational resources independently ?Outcome: Progressing ?Goal: RH STG INCREASE KNOWLEDGE OF DYSPHAGIA/FLUID INTAKE ?Description: Patient and family will be able to manage HLD with medications and dietary modifications using handouts and educational resources independently ?Outcome: Progressing ?Goal: RH STG INCREASE KNOWLEDGE OF STROKE PROPHYLAXIS ?Description: Patient and family will be able to manage stroke risks with medications and dietary modifications using handouts and educational resources independently ?Outcome: Progressing ?  ?

## 2022-04-10 NOTE — Progress Notes (Signed)
Occupational Therapy Session Note ? ?Patient Details  ?Name: Sarah Myers ?MRN: [id number] ?Date of Birth: [birth year] ? ?Today's Date: 04/10/2022 ?OT Individual Time: 1315-1430 ?OT Individual Time Calculation (min): 75 min  ? ? ?Short Term Goals: ?Week 1:  OT Short Term Goal 1 (Week 1): Pt will complete UB dressing with min assist ?OT Short Term Goal 2 (Week 1): Pt will complete squat pivot BSC transfer with mod assist ?OT Short Term Goal 3 (Week 1): Pt will complete LB dressing with mod assist ?OT Short Term Goal 4 (Week 1): Pt will complete oral hygiene with setup and min VCs for visual scanning and item retrieval. ? ?Skilled Therapeutic Interventions/Progress Updates:  ?  Subjective: Pt states no pain, but does not want to go to gym because it is too cold.  Also concerned about her daughter coming to visit her.  ?  Objective:  Pt sitting in recliner. Total assist to call pts dtr on phone due to pt unable to recall her daughter phone number and unable to visually scan and locate numbers on phone.  Dtr provided verbal encouragement to increase pts participation.  Donned long sleeve shirt and jacket with mod assist to improve comfort while working in gym.  Squat pivot to w/c with mod assist.  Transported to ortho gym and worked on visual scanning and pursuits using BITS in seated position (slightly turned to right to encourage left head turn/scanning and attention).  Pt required max verbal and min visual cues to successfully locate large circles and tap each one during scanning and pursuits.  Pt then taken to kitchen and practiced visual scanning and left attention at tabletop using various fruit to locate, grasp, and place in basket on right side.  Pt able to locate fruit and correctly name them when placed directly in front and even slightly to the left in upper quadrant, however pt unable to locate fruit placed in lower left quadrant despite providing max verbal and visual cues.  Slight increased ability to  locate when therapist provided max tactile cues to encourage left head turn.  Returned to room and completed squat pivot to recliner with max assist. Call bell in reach, seat alarm on.   ?  Assessment:  Pt exhibiting signs of poor visual scanning and visual pursuits, mild to moderate depth perception impairments, and possible left visual field cut more prominently in inferior plane.  This hinders pts ability to move about in her environment safely and also retrieving ADL items. ?  Plan: Pt would benefit from further training on visual scanning, pursuits, left attention, and compensatory techniques including left head turning. ? ?Therapy Documentation ?Precautions:  ?Precautions ?Precautions: Fall ?Precaution Comments: L inattention, PEG, left hemi, right head gaze ?Restrictions ?Weight Bearing Restrictions: No ? ? ? ?Therapy/Group: Individual Therapy ? ?[name] Dimitrious Micciche ?04/10/2022, 4:24 PM ?

## 2022-04-10 NOTE — Progress Notes (Signed)
Speech Language Pathology Daily Session Note ? ?Patient Details  ?Name: Sarah Myers ?MRN: [id number] ?Date of Birth: October 23, 1971 ? ?Today's Date: 04/10/2022 ?SLP Individual Time: 7619-5093 ?SLP Individual Time Calculation (min): 55 min ? ?Short Term Goals: ?Week 1: SLP Short Term Goal 1 (Week 1): Patient will consume current diet with minimal overt s/s of aspiration with supervision level verbal cues for use of swallowing compensatory strategies. ?SLP Short Term Goal 2 (Week 1): Patient will demonstrate efficient mastication and complete oral clearance with trials of Dys. 3 textures without overt s/s of aspiration with supervision level verbal cues over 2 sessions prior to upgrade. ?SLP Short Term Goal 3 (Week 1): Patient will demonstrate sustained attention to functional tasks for 10 minutes with Mod verbal cues for redirection. ?SLP Short Term Goal 4 (Week 1): Patient will demonstrate functional problem solving for basic and familiar tasks with mod verbal cues. ?SLP Short Term Goal 5 (Week 1): Patient will attend/scan to midline during functional tasks with Mod verbal cues. ? ?Skilled Therapeutic Interventions: Skilled treatment session focused on cognitive goals. Upon arrival, patient was awake and alert while upright in the bed. SLP facilitated session by providing Max A verbal, visual and tactile cues for problem solving and visual scanning during a basic calendar making task. Patient appeared to use her hands rather than turn her head and to move her eyes requiring extensive cues. This lead to patient requiring Max verbal cues to identify numbers and functional items (example: thought a chair was a bike) and when questioned, verbalized because it was due to the "noise" she was hearing. SLP also facilitated session with a functional conversation that focused on intellectual awareness of deficits. SLP sat at patient's midline and patient required Max verbal cues to maintain eye contact for ~10 seconds.  Max-Total A multimodal cues were also needed for intellectual awareness of both her physical and cognitive deficits and their impact on her overall function. Patient left upright in bed with alarm on and all needs within reach. Continue with current plan of care.  ?   ? ?Pain ?Pain Assessment ?Pain Scale: 0-10 ?Pain Score: 0-No pain ? ?Therapy/Group: Individual Therapy ? ?Ernest Orr ?04/10/2022, 9:01 AM ?

## 2022-04-11 DIAGNOSIS — I619 Nontraumatic intracerebral hemorrhage, unspecified: Secondary | ICD-10-CM | POA: Diagnosis not present

## 2022-04-11 LAB — GLUCOSE, CAPILLARY
Glucose-Capillary: 103 mg/dL — ABNORMAL HIGH (ref 70–99)
Glucose-Capillary: 124 mg/dL — ABNORMAL HIGH (ref 70–99)
Glucose-Capillary: 89 mg/dL (ref 70–99)
Glucose-Capillary: 106 mg/dL — ABNORMAL HIGH (ref 70–99)

## 2022-04-11 MED ORDER — BACLOFEN 10 MG PO TABS
10.0000 mg | ORAL_TABLET | Freq: Every day | ORAL | Status: DC
Start: 2022-04-12 — End: 2022-04-15
  Administered 2022-04-12 – 2022-04-13 (×2): 10 mg via ORAL
  Filled 2022-04-11 (×3): qty 1

## 2022-04-11 NOTE — Plan of Care (Signed)
?  Problem: Consults ?Goal: RH STROKE PATIENT EDUCATION ?Description: See Patient Education module for education specifics  ?Outcome: Progressing ?  ?Problem: RH SKIN INTEGRITY ?Goal: RH STG MAINTAIN SKIN INTEGRITY WITH ASSISTANCE ?Description: STG Maintain Skin Integrity With min Assistance. ?Outcome: Progressing ?Goal: RH STG ABLE TO PERFORM INCISION/WOUND CARE W/ASSISTANCE ?Description: STG Able To Perform Incision/Wound Care With min Assistance. ?Outcome: Progressing ?  ?Problem: RH SAFETY ?Goal: RH STG ADHERE TO SAFETY PRECAUTIONS W/ASSISTANCE/DEVICE ?Description: STG Adhere to Safety Precautions With cues Assistance/Device. ?Outcome: Progressing ?  ?Problem: RH PAIN MANAGEMENT ?Goal: RH STG PAIN MANAGED AT OR BELOW PT'S PAIN GOAL ?Description: At or below level 4 w prns ?Outcome: Progressing ?  ?Problem: RH KNOWLEDGE DEFICIT ?Goal: RH STG INCREASE KNOWLEDGE OF HYPERTENSION ?Description: Patient and family will be able to manage HTN with medications and dietary modifications using handouts and educational resources independently ?Outcome: Progressing ?Goal: RH STG INCREASE KNOWLEDGE OF DYSPHAGIA/FLUID INTAKE ?Description: Patient and family will be able to manage HLD with medications and dietary modifications using handouts and educational resources independently ?Outcome: Progressing ?Goal: RH STG INCREASE KNOWLEDGE OF STROKE PROPHYLAXIS ?Description: Patient and family will be able to manage stroke risks with medications and dietary modifications using handouts and educational resources independently ?Outcome: Progressing ?  ?

## 2022-04-11 NOTE — Progress Notes (Signed)
?                                                       PROGRESS NOTE ? ? ?Subjective/Complaints: ?No new complaints this morning ?Hand was hurting a little but this has resolved- therapy tried kinesiotaping ?Working with SLP now ? ?ROS:  ? ?Pt denies SOB, abd pain, CP, N/V/C/D, and vision changes, + pain in hand intermittently ? ?Objective: ?  ?No results found. ?Recent Labs  ?  04/10/22 ?0541  ?WBC 6.4  ?HGB 13.0  ?HCT 39.6  ?PLT 187  ? ? ?Recent Labs  ?  04/10/22 ?0541  ?NA 137  ?K 3.8  ?CL 108  ?CO2 21*  ?GLUCOSE 110*  ?BUN 10  ?CREATININE 0.94  ?CALCIUM 10.6*  ? ? ?Intake/Output Summary (Last 24 hours) at 04/11/2022 1045 ?Last data filed at 04/10/2022 1851 ?Gross per 24 hour  ?Intake 180 ml  ?Output --  ?Net 180 ml  ?  ? ?  ? ?Physical Exam: ?Vital Signs ?Blood pressure (!) 147/79, pulse 63, temperature 98.3 ?F (36.8 ?C), temperature source Oral, resp. rate 16, height 5\' 4"  (1.626 m), weight 60 kg, SpO2 98 %. ? ?General: awake, alert, appropriate,sitting up in bed; leaning to left; BMI 22.71 ?HENT: conjugate gaze; oropharynx moist, improved visual scanning to the left ?CV: regular rate; no JVD ?Pulmonary: CTA B/L; no W/R/R- good air movement ?GI: soft, NT, ND, (+)BS ?Psychiatric: appropriate- but flat ?Neurological: L head tilt/leaning to L- L inattention- no change ?Musculoskeletal: Patch on L shoulder ?   Cervical back: Neck supple. No tenderness.  ?   Comments: RUE- 4/5 or more- hard to test ?RLE 3+/5 proximally and 4-/5 distally ?LUE- 0/5 ?LLE- 2-/5 in HF and PF only ?  ?Skin: ?   General: Skin is warm and dry.  ?   Comments: Scab 1.5 cm on L forearm- otherwise skin looks OK  ?Neurological:  ?   Mental Status: She is alert and oriented to person, place, and time.  ?   Comments: Severe L inattention/neglect ?Intact to light touch in all 4 extremities ?Dysconjugate gaze, but denies double vision.  ?MAS of 2-3 in LUE_ shoulder MAS of 3; elbow 2-3 and MAS of 2 in L wrist ?MAS of 1+ to 2 in LLE ?Puffy L hand ?3  beats clonus LLE and (+) hoffman's LUE  ?Psychiatric:     ?   Mood and Affect: Depressed mood ?   Comments: vague  ? ? ?Assessment/Plan: ?1. Functional deficits which require 3+ hours per day of interdisciplinary therapy in a comprehensive inpatient rehab setting. ?Physiatrist is providing close team supervision and 24 hour management of active medical problems listed below. ?Physiatrist and rehab team continue to assess barriers to discharge/monitor patient progress toward functional and medical goals ? ?Care Tool: ? ?Bathing ?   ?Body parts bathed by patient: Front perineal area, Buttocks  ? Body parts bathed by helper: Buttocks, Front perineal area ?  ?  ?Bathing assist Assist Level: Maximal Assistance - Patient 24 - 49% ?  ?  ?Upper Body Dressing/Undressing ?Upper body dressing   ?What is the patient wearing?: Hospital gown only ?   ?Upper body assist Assist Level: Moderate Assistance - Patient 50 - 74% ?   ?Lower Body Dressing/Undressing ?Lower body dressing ? ? ?   ?What  is the patient wearing?: Hospital gown only ? ?  ? ?Lower body assist Assist for lower body dressing: Maximal Assistance - Patient 25 - 49% ?   ? ?Toileting ?Toileting    ?Toileting assist Assist for toileting: Total Assistance - Patient < 25% ?  ?  ?Transfers ?Chair/bed transfer ? ?Transfers assist ?   ? ?Chair/bed transfer assist level: Moderate Assistance - Patient 50 - 74% ?  ?  ?Locomotion ?Ambulation ? ? ?Ambulation assist ? ?   ? ?Assist level: Moderate Assistance - Patient 50 - 74% ?Assistive device: Parallel bars ?Max distance: 16ft  ? ?Walk 10 feet activity ? ? ?Assist ? Walk 10 feet activity did not occur: Safety/medical concerns ? ?  ?   ? ?Walk 50 feet activity ? ? ?Assist Walk 50 feet with 2 turns activity did not occur: Safety/medical concerns ? ?  ?   ? ? ?Walk 150 feet activity ? ? ?Assist Walk 150 feet activity did not occur: Safety/medical concerns ? ?  ?  ?  ? ?Walk 10 feet on uneven surface  ?activity ? ? ?Assist Walk 10  feet on uneven surfaces activity did not occur: Safety/medical concerns ? ? ?  ?   ? ?Wheelchair ? ? ? ? ?Assist Is the patient using a wheelchair?: Yes ?Type of Wheelchair: Manual ?  ? ?Wheelchair assist level: Dependent - Patient 0% ?Max wheelchair distance: 127ft  ? ? ?Wheelchair 50 feet with 2 turns activity ? ? ? ?Assist ? ?  ?  ? ? ?Assist Level: Dependent - Patient 0%  ? ?Wheelchair 150 feet activity  ? ? ? ?Assist ?   ? ? ?Assist Level: Dependent - Patient 0%  ? ?Blood pressure (!) 147/79, pulse 63, temperature 98.3 ?F (36.8 ?C), temperature source Oral, resp. rate 16, height 5\' 4"  (1.626 m), weight 60 kg, SpO2 98 %. ? ?Medical Problem List and Plan: ?1. 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            -patient may  shower- cover PEG ?            -ELOS/Goals: 14-16 days min A to supervision ? HFU scheduled ? Continue CIR- PT, OT and SLP ?2.  Antithrombotics: ?-DVT/anticoagulation:  Pharmaceutical: Other (comment)Eliquis ?            -antiplatelet therapy: none ?3. Pain Management: Tylenol prn, Robaxin and Neurotin scheduled, Lidoderm patch ? 4/23- shoulder pain controlled with meds- con't regimen and have nursing remove patch in AM's.  ?4. Mood: LCSW to evaluate and provide emotional support ?            --continue sertraline 50 mg daily ?            -antipsychotic agents: n/a ?5. Neuropsych: This patient is capable of making decisions on her own behalf. ?6. Skin/Wound Care: Routine skin care checks ?7. Fluids/Electrolytes/Nutrition: Routine Is and Os and follow-up chemistries ?8: Left-sided weakness: ?            --scheduled Baclofen for spasticity 5 mg TID ?            --schedule Neurontin for hyperesthesia/pain LUE ? 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 Increase magnesium gluconate to 500mg  HS. Continue Cozaar 25mg  daily. ?12.  Constipation: increase magnesium gluconate to 500mg  HS. Provided with list of high fiber foods ?13. Hypokalemia: decrease klor to 20mg   daily. K+ 3.8, continue this dose. Discussed with her daughter giving her smoothies with fruits at home instead of the supplement ?  ?  ? ?LOS: ?7 days ?A FACE TO FACE EVALUATION WAS PERFORMED ? ?Clint Bolder P Ranon Coven ?04/11/2022, 10:45 AM  ? ?  ?

## 2022-04-11 NOTE — Progress Notes (Addendum)
Occupational Therapy Session Note ? ?Patient Details  ?Name: Sarah Myers ?MRN: [id number] ?Date of Birth: 03-Oct-1971 ? ?Today's Date: 04/11/2022 ?OT Individual Time: 1000-1053 ?OT Individual Time Calculation (min): 53 min  ? ? ?Short Term Goals: ?Week 1:  OT Short Term Goal 1 (Week 1): Pt will complete UB dressing with min assist ?OT Short Term Goal 2 (Week 1): Pt will complete squat pivot BSC transfer with mod assist ?OT Short Term Goal 3 (Week 1): Pt will complete LB dressing with mod assist ?OT Short Term Goal 4 (Week 1): Pt will complete oral hygiene with setup and min VCs for visual scanning and item retrieval. ? ? ?Skilled Therapeutic Interventions/Progress Updates:  ?  Subjective: Pt states she would like to get dressed and wash up during this session. No c/o pain.  ?  Objective:  Pt sitting up in recliner.  Squat pivot towards affected side with mod assist and max multimodal cues. Transported to sink.  Provided question cues and instructional cues to initiate and facilitate scanning of all ADL items. Pt able to scan and identify all items on counter including toothbrush, toothpaste, deoderant spray, towels, and soap with mod verbal and min visual cues for scanning.  Pt doffed shirt, bathed UB, and donned shirt with min assist and increased time and mod to max verbal cues and also use of LHS.  Pt reports feeling tired at end of session and requesting back to recliner.  Stand pivot to recliner with mod assist.  Call bell in reach, seat alarm on.   ?  Assessment:  Pt making progress evidenced by increased independence during ADL item retrieval and UB bathing and dressing.  Primary barriers today included impaired visual scanning and decreased depth perception, right head gaze preference, and left neglect and inattention to left.  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 ?  Plan: Pt would benefit from further training on visual scanning, left attention, and pt directed therapy sessions.    ? ?Therapy Documentation ?Precautions:  ?Precautions ?Precautions: Fall ?Precaution Comments: L inattention, PEG, left hemi, right head gaze ?Restrictions ?Weight Bearing Restrictions: No ? ? ? ?Therapy/Group: Individual Therapy ? ?Sarah Myers ?04/11/2022, 11:59 AM ?

## 2022-04-11 NOTE — Progress Notes (Signed)
Physical Therapy Weekly Progress Note ? ?Patient Details  ?Name: Sarah Myers ?MRN: [id number] ?Date of Birth: Feb 27, 1971 ? ?Beginning of progress report period: April 05, 2022 ?End of progress report period: April 11, 2022 ? ?Today's Date: 04/11/2022 ?PT Individual Time: 2355-7322 ?PT Individual Time Calculation (min): 57 min  ? ?Patient has met 2 of 4 short term goals. Pt is making slow progress this reporting period. Overall, she requires modA for bed mobility, CGA/minA for unsupported sitting balance, modA for sit<>stand and squat<>pivot transfers, and is ambulating ~15-53f with maxA using R hand rail and +2 assist for chair follow. She continues to be primarily limited by L sided weakness, L sided tone, L neglect, visual impairments, cognitive deficits, and apathy towards therapy.  ? ?Patient continues to demonstrate the following deficits muscle weakness and muscle joint tightness, decreased cardiorespiratoy endurance, abnormal tone, unbalanced muscle activation, motor apraxia, decreased coordination, and decreased motor planning, decreased visual acuity, decreased visual perceptual skills, decreased visual motor skills, and field cut, left side neglect, decreased motor planning, and ideational apraxia, decreased initiation, decreased attention, decreased awareness, decreased problem solving, decreased safety awareness, decreased memory, and delayed processing, and decreased sitting balance, decreased standing balance, decreased postural control, hemiplegia, and decreased balance strategies and therefore will continue to benefit from skilled PT intervention to increase functional independence with mobility. ? ?Patient progressing toward long term goals..  Continue plan of care. ? ?PT Short Term Goals ?Week 1:  PT Short Term Goal 1 (Week 1): Pt will complete bed mobility with modA ?PT Short Term Goal 1 - Progress (Week 1): Met ?PT Short Term Goal 2 (Week 1): Pt will complete bed<>chair transfers with modA  and LRAD ?PT Short Term Goal 2 - Progress (Week 1): Met ?PT Short Term Goal 3 (Week 1): Pt will ambulate 225fwith maxA and LRAD ?PT Short Term Goal 3 - Progress (Week 1): Partly met ?PT Short Term Goal 4 (Week 1): Pt will initiate stair training ?PT Short Term Goal 4 - Progress (Week 1): Not met ?Week 2:  PT Short Term Goal 1 (Week 2): Pt will complete bed mobility with minA and LRAD ?PT Short Term Goal 2 (Week 2): Pt will complete bed<>chair transfers with minA and LRAD ?PT Short Term Goal 3 (Week 2): Pt will ambulate 2558fith maxA and LRAD ?PT Short Term Goal 4 (Week 2): Pt will initiate stair training ? ?Skilled Therapeutic Interventions/Progress Updates:  ?   ?Direct handoff of care from LPN to start session with pt sitting in recliner. Pt denies pain. Flat affect throughout session and required ++ time for processing and initiation during treatment. Limited engagement from her as well. Pt already fully dressed with jacket and pants, she requests to bring a blanket to keep warm.  ? ?Squat<>pivot transfer with modA from recliner to w/c, towards her weaker L side. Required blocking of LLE from adducting due to tone. She also required assist for repositioning in w/c. ? ? Transported to ortho rehab gym for time management and assisted to mat table in similar manner. At edge of mat, she was able to sit unsupported with CGA. Provided large mirror for visual feedback due to strong R gaze preference and L neglect, PT sitting on L side to promote scanning/attention. Even with mirror in front, pt unable to sustain forward gaze >5-10 seconds before returning to R gaze. We worked on repeated sit<>stand with no AD and L knee blocked for buckling as well as for managing her tone. She requires  modA for sit<>stand and minA for standing balance once achieved. Standing posture is poor with forward flexed trunk, rotated pelvis to her L, and L knee flexed in stance. Very difficult to correct postural deficits due to fatigue,  neglect, and tone. We attempted to work on standing balance task with toe tapping to 4inch block using arm chair for LUE support and PT supporting her on her L side, but pt unable to complete due to L buckling and motor planning deficits.  ? ?She was assisted to supine on mat table to work on tone management with AAROM and PROM to tolerance. Heel cord and hamstring stretching and education provided on importance of continuing to wear night braces to prevent contractures. Active there-ex with bridging and hamstring curls using therapy ball to break up tone.  ? ?Pt assisted back to her w/c with modA squat<>Pivot transfer and then returned to her room where she was assisted back to the recliner in similar manner. Therapeutically positioned with pillows for support, BLE elevated, safety belt alarm on, and call bell in lap.  ? ?Therapy Documentation ?Precautions:  ?Precautions ?Precautions: Fall ?Precaution Comments: L inattention, PEG, left hemi, right head gaze ?Restrictions ?Weight Bearing Restrictions: No ?General: ?  ? ?Therapy/Group: Individual Therapy ? ?Malayja Freund P Lavontay Kirk ?04/11/2022, 7:40 AM  ?

## 2022-04-11 NOTE — Progress Notes (Signed)
Speech Language Pathology Daily Session Note ? ?Patient Details  ?Name: Sarah Myers ?MRN: [id number] ?Date of Birth: July 23, 1971 ? ?Today's Date: 04/11/2022 ?SLP Individual Time: 7628-3151 ?SLP Individual Time Calculation (min): 60 min ? ?Short Term Goals: ?Week 1: SLP Short Term Goal 1 (Week 1): Patient will consume current diet with minimal overt s/s of aspiration with supervision level verbal cues for use of swallowing compensatory strategies. ?SLP Short Term Goal 2 (Week 1): Patient will demonstrate efficient mastication and complete oral clearance with trials of Dys. 3 textures without overt s/s of aspiration with supervision level verbal cues over 2 sessions prior to upgrade. ?SLP Short Term Goal 3 (Week 1): Patient will demonstrate sustained attention to functional tasks for 10 minutes with Mod verbal cues for redirection. ?SLP Short Term Goal 4 (Week 1): Patient will demonstrate functional problem solving for basic and familiar tasks with mod verbal cues. ?SLP Short Term Goal 5 (Week 1): Patient will attend/scan to midline during functional tasks with Mod verbal cues. ? ?Skilled Therapeutic Interventions: ?Pt seen, this date, for skilled ST intervention targeting dysphagia and cognitive-linguistic goals outlined above. Pt encountered sleeping in bed; aroused to name easily. Agreeable to move OOB to chair for breakfast; transferred from bed to recliner chair with Stedy + 2 for safety. Amenable to ST intervention. ? ?SLP facilitated today's session by providing the following skilled interventions within the context of functional and therapeutic tx tasks: compensatory strategy training for left visual attention, verbal and visual cues, model prompts, corrective feedback, positive reinforcement, and mass practice.  ? ?Re: deglutition, pt consumed Dysphagia 2 texture with thin liquid via straw with no clinical s/sx concerning for aspiration. Additionally, oral phase appeared functional for recommended  textures, with only generalized weakness noted during mastication. Pt implemented recommended aspiration precautions with Mod I. Required Mod verbal and visual A to scan to L to locate drink. Reports low appetite with intake per chart at or below 50%; MD notified. Will plan to address mastication with ice chips and Dysphagia 3 textures next session. ? ?Re: cognitive-linguistic skills, pt completed L visual attention/scanning task with overall Min-Mod faded to Supervision A for head scanning + visual anchor. 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 Attended to structured, card sorting task for 10 minutes with Min verbal A with 100% accuracy. ? ?Session concluded with pt in recliner chair, safety belt donned, call bell within reach, and all immediate needs met. Continue per current ST POC. ? ?Pain ?No/Denies pain; NAD  ? ?Therapy/Group: Individual Therapy ? ?Haze Antillon A Persia Lintner ?04/11/2022, 12:30 PM ?

## 2022-04-12 DIAGNOSIS — I619 Nontraumatic intracerebral hemorrhage, unspecified: Secondary | ICD-10-CM | POA: Diagnosis not present

## 2022-04-12 LAB — GLUCOSE, CAPILLARY
Glucose-Capillary: 103 mg/dL — ABNORMAL HIGH (ref 70–99)
Glucose-Capillary: 119 mg/dL — ABNORMAL HIGH (ref 70–99)
Glucose-Capillary: 109 mg/dL — ABNORMAL HIGH (ref 70–99)
Glucose-Capillary: 91 mg/dL (ref 70–99)

## 2022-04-12 NOTE — Progress Notes (Signed)
Patient ID: Sarah Myers, female   DOB: 10/19/1971, 50 y.o.   MRN: [id number]  Met with pt and spoke with her daughter-Breosha via telephone to discuss team conference goals of min assist level and target discharge of 5/10. Pt smiled and was happy, but also spoke with her about fully participating in her therapies to get all that she can while here. Discussed with both the issue regarding the flight of stairs into her apartment and the unlikelihood she will be able to do this at discharge. They will see if a first floor is available and go from there. Will continue to await equipment and will begin home health referral. Neuro-psych to see on Monday when returns from vacation ?

## 2022-04-12 NOTE — Progress Notes (Signed)
Initial Nutrition Assessment ? ?DOCUMENTATION CODES:  ? ?Not applicable ? ?INTERVENTION:  ?- Request updated weight ? ?- Encourage PO intake ? ?- Continue Ensure Enlive po once daily, each supplement provides 350 kcal and 20 grams of protein. ? ?- Vital Cuisine Shake TID, each supplement provides 520 kcal and 22 grams of protein ? ?- MVI with minerals daily ? ?NUTRITION DIAGNOSIS:  ? ?Inadequate oral intake related to decreased appetite, acute illness as evidenced by meal completion < 50%. ? ?GOAL:  ? ?Patient will meet greater than or equal to 90% of their needs ? ?MONITOR:  ? ?PO intake, Supplement acceptance, Diet advancement, Labs, Weight trends ? ?REASON FOR ASSESSMENT:  ? ?Malnutrition Screening Tool ?  ? ?ASSESSMENT:  ? ?Pt admitted to CIR with functional deficits after admission for basal ganglia hemorrhage. PMH significant for HTN. ? ?Pt sitting in bed eating lunch during visit. She states that PTA she was eating really well but during rehab admission, she is only eating up to 50% of her meals. She has not has much of an appetite during admission and reports that she is getting tired of the minced chicken and has a desire for spaghetti which we discussed she will be able to have once diet it advanced further. Pt reports being lactose intolerant and that the tube feeding and Ensure was giving her diarrhea. Explained that these supplements are suitable for lactose intolerance and the importance of nutrition supplements to enhance nutritional intake to prevent the need for restarting tube feeding as this is something she expressed she was not interested in doing. Pt is receiving Hormel Shakes on meal trays which she is agreeable to sipping throughout the day.   ? ?Pt noted to have had PEG tube placed 3/20 during hospital admission. ? ?Meal completions: ?04/22: 40%-breakfast, 0%-lunch ?04/23: 75%-breakfast, 25%-lunch, 10%-dinner ?04/24: 50%-breakfast, 50%-lunch, 25%-dinner ? ?Noted nursing documentation of mild  generalized, nonpitting BUE/BLE edema present.  ? ?Pt did not provide usual weight and is unsure of recent weight loss. Last weight noted to be from 4/19. Will request updated weight from nursing to reassess. ? ?Medications: pepcid, SSI, medihoney, magonate, MVI, KLOR-CON ? ?Labs reviewed ? ?NUTRITION - FOCUSED PHYSICAL EXAM: ? ?Flowsheet Row Most Recent Value  ?Orbital Region No depletion  ?Upper Arm Region No depletion  ?Thoracic and Lumbar Region No depletion  ?Buccal Region No depletion  ?Temple Region No depletion  ?Clavicle Bone Region No depletion  ?Clavicle and Acromion Bone Region No depletion  ?Scapular Bone Region No depletion  ?Dorsal Hand No depletion  ?Patellar Region Mild depletion  ?Anterior Thigh Region Mild depletion  ?Posterior Calf Region Moderate depletion  ?Edema (RD Assessment) Mild  [non-pitting BLE/BUE]  ?Hair Reviewed  ?Eyes Reviewed  ?Mouth Reviewed  ?Skin Reviewed  ?Nails Reviewed  ? ?  ? ? ?Diet Order:   ?Diet Order   ? ?       ?  DIET DYS 2 Room service appropriate? Yes; Fluid consistency: Thin  Diet effective now       ?  ? ?  ?  ? ?  ? ? ?EDUCATION NEEDS:  ? ?Education needs have been addressed ? ?Skin:  Skin Assessment: Reviewed RN Assessment ? ?Last BM:  4/25 (type 5) ? ?Height:  ? ?Ht Readings from Last 1 Encounters:  ?04/04/22 5\' 4"  (1.626 m)  ? ? ?Weight:  ? ?Wt Readings from Last 1 Encounters:  ?04/05/22 60 kg  ? ?BMI:  Body mass index is 22.71 kg/m?. ? ?Estimated  Nutritional Needs:  ? ?Kcal:  1800-2000 ? ?Protein:  90-105g ? ?Fluid:  >/=1.8L ? ?Sarah Myers, RDN, LDN ?Clinical Nutrition ?

## 2022-04-12 NOTE — Progress Notes (Signed)
Physical Therapy Session Note ? ?Patient Details  ?Name: Sarah Myers ?MRN: [id number] ?Date of Birth: [birth year] ? ?Today's Date: 04/12/2022 ?PT Individual Time: 7616-0737 ?PT Individual Time Calculation (min): 43 min  ? ?Short Term Goals: ?Week 1:  PT Short Term Goal 1 (Week 1): Pt will complete bed mobility with modA ?PT Short Term Goal 1 - Progress (Week 1): Met ?PT Short Term Goal 2 (Week 1): Pt will complete bed<>chair transfers with modA and LRAD ?PT Short Term Goal 2 - Progress (Week 1): Met ?PT Short Term Goal 3 (Week 1): Pt will ambulate 54f with maxA and LRAD ?PT Short Term Goal 3 - Progress (Week 1): Partly met ?PT Short Term Goal 4 (Week 1): Pt will initiate stair training ?PT Short Term Goal 4 - Progress (Week 1): Not met ? ?Skilled Therapeutic Interventions/Progress Updates:  ?Patient supine in bed on entrance to room. Patient alert and agreeable to PT session.  ? ?Patient with no pain complaint throughout session. ? ?Therapeutic Activity: ?Bed Mobility: Pt relates that she gets out of bed at home to R side of bed. Patient performed supine --> sit  to R side with MinA and excessive cues for bringing LLE to EOB with MinA. Then requires multimodal cues for effective push with RUE from mildly elevated HOB to push UB to upright seated position. Is able to push halfway then MinA to complete. Close supervision to maintain sitting balance during donning socks and shoes.  ?Transfers: Squat pivot to L side throughout session with Min/ ModA and vc for effort, forward lean and hand placement. Sit<>stand transfers during blocked practice with MCesar Chavez Provided verbal cues for technique throughout. ? ?Neuromuscular Re-ed: ?NMR facilitated during session with focus on sitting balance, cognition, visual field cut. Guided in reach for bean bags placed to L side and match to same color disc on tray table in front of pt biased to L side. Asked re: color of chosen bag and correct 4/6 times when holding bag close to  eyes. She does require  time to id color, cues to turn head to L, and look to target discs biased to pt's L side. 3/6 bags placed to R side of table when pt cannot find matching color disc. Requires cues to complete all 6 colors. NMR performed for improvements in motor control and coordination, balance, sequencing, judgement, and self confidence/ efficacy in performing all aspects of mobility at highest level of independence.  ? ?Patient seated upright in recliner at end of session with brakes locked, belt alarm set, and all needs within reach. Oriented to time and time of next therapy session.  ? ? ?Therapy Documentation ?Precautions:  ?Precautions ?Precautions: Fall ?Precaution Comments: L inattention, PEG, left hemi, right head gaze ?Restrictions ?Weight Bearing Restrictions: No ?General: ?  ?Vital Signs: ?  ?Pain: ? No pain complaint this session. ? ?Therapy/Group: Individual Therapy ? ?JAlger SimonsPT, DPT ?04/12/2022, 9:17 AM  ?

## 2022-04-12 NOTE — Progress Notes (Addendum)
?                                                       PROGRESS NOTE ? ? ?Subjective/Complaints: ?Sleeping well this morning.  ?Patient's chart reviewed- No issues reported overnight ?Vitals signs stable  ? ?ROS:  ? ?Pt denies SOB, abd pain, CP, N/V/C/D, and vision changes, + pain in hand intermittently ? ?Objective: ?  ?No results found. ?Recent Labs  ?  04/10/22 ?0541  ?WBC 6.4  ?HGB 13.0  ?HCT 39.6  ?PLT 187  ? ? ?Recent Labs  ?  04/10/22 ?0541  ?NA 137  ?K 3.8  ?CL 108  ?CO2 21*  ?GLUCOSE 110*  ?BUN 10  ?CREATININE 0.94  ?CALCIUM 10.6*  ? ?No intake or output data in the 24 hours ending 04/12/22 0826 ?  ? ?  ? ?Physical Exam: ?Vital Signs ?Blood pressure 133/71, pulse 61, temperature 98.5 ?F (36.9 ?C), temperature source Oral, resp. rate 14, height 5\' 4"  (1.626 m), weight 60 kg, SpO2 100 %. ? ?General: awake, alert, appropriate,sitting up in bed; leaning to left; BMI 22.71 ?HENT: conjugate gaze; oropharynx moist, improved visual scanning to the left ?CV: regular rate; no JVD ?Pulmonary: CTA B/L; no W/R/R- good air movement ?GI: soft, NT, ND, (+)BS ?Psychiatric: appropriate- but flat ?Neurological: L head tilt/leaning to L- L inattention- no change ?Musculoskeletal: Patch on L shoulder ?   Cervical back: Neck supple. No tenderness.  ?   Comments: RUE- 4/5 or more- hard to test ?RLE 3+/5 proximally and 4-/5 distally ?LUE- 0/5 ?LLE- 2-/5 in HF and PF only ?  ?Skin: ?   General: Skin is warm and dry.  ?   Comments: Scab 1.5 cm on L forearm- otherwise skin looks OK  ?Neurological:  ?   Mental Status: She is alert and oriented to person, place, and time.  ?   Comments: Severe L inattention/neglect ?Intact to light touch in all 4 extremities ?Dysconjugate gaze, but denies double vision.  ?MAS of 2-3 in LUE_ shoulder MAS of 3; elbow 2-3 and MAS of 2 in L wrist ?MAS of 1+ to 2 in LLE ?Puffy L hand ?Squat pivot modA ?3 beats clonus LLE and (+) hoffman's LUE  ?Psychiatric:     ?   Mood and Affect: Depressed mood ?    Comments: vague  ? ? ?Assessment/Plan: ?1. Functional deficits which require 3+ hours per day of interdisciplinary therapy in a comprehensive inpatient rehab setting. ?Physiatrist is providing close team supervision and 24 hour management of active medical problems listed below. ?Physiatrist and rehab team continue to assess barriers to discharge/monitor patient progress toward functional and medical goals ? ?Care Tool: ? ?Bathing ?   ?Body parts bathed by patient: Front perineal area, Buttocks  ? Body parts bathed by helper: Buttocks, Front perineal area ?  ?  ?Bathing assist Assist Level: Maximal Assistance - Patient 24 - 49% ?  ?  ?Upper Body Dressing/Undressing ?Upper body dressing   ?What is the patient wearing?: Hospital gown only ?   ?Upper body assist Assist Level: Moderate Assistance - Patient 50 - 74% ?   ?Lower Body Dressing/Undressing ?Lower body dressing ? ? ?   ?What is the patient wearing?: Hospital gown only ? ?  ? ?Lower body assist Assist for lower body dressing: Maximal Assistance -  Patient 25 - 49% ?   ? ?Toileting ?Toileting    ?Toileting assist Assist for toileting: Total Assistance - Patient < 25% ?  ?  ?Transfers ?Chair/bed transfer ? ?Transfers assist ?   ? ?Chair/bed transfer assist level: Moderate Assistance - Patient 50 - 74% ?  ?  ?Locomotion ?Ambulation ? ? ?Ambulation assist ? ?   ? ?Assist level: Moderate Assistance - Patient 50 - 74% ?Assistive device: Parallel bars ?Max distance: 76ft  ? ?Walk 10 feet activity ? ? ?Assist ? Walk 10 feet activity did not occur: Safety/medical concerns ? ?  ?   ? ?Walk 50 feet activity ? ? ?Assist Walk 50 feet with 2 turns activity did not occur: Safety/medical concerns ? ?  ?   ? ? ?Walk 150 feet activity ? ? ?Assist Walk 150 feet activity did not occur: Safety/medical concerns ? ?  ?  ?  ? ?Walk 10 feet on uneven surface  ?activity ? ? ?Assist Walk 10 feet on uneven surfaces activity did not occur: Safety/medical concerns ? ? ?  ?    ? ?Wheelchair ? ? ? ? ?Assist Is the patient using a wheelchair?: Yes ?Type of Wheelchair: Manual ?  ? ?Wheelchair assist level: Dependent - Patient 0% ?Max wheelchair distance: 124ft  ? ? ?Wheelchair 50 feet with 2 turns activity ? ? ? ?Assist ? ?  ?  ? ? ?Assist Level: Dependent - Patient 0%  ? ?Wheelchair 150 feet activity  ? ? ? ?Assist ?   ? ? ?Assist Level: Dependent - Patient 0%  ? ?Blood pressure 133/71, pulse 61, temperature 98.5 ?F (36.9 ?C), temperature source Oral, resp. rate 14, height 5\' 4"  (1.626 m), weight 60 kg, SpO2 100 %. ? ?Medical Problem List and Plan: ?1. 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            -patient may  shower- cover PEG ?            -ELOS/Goals: 14-16 days min A ? HFU scheduled ? -Interdisciplinary Team Conference today   ? Continue CIR- PT, OT and SLP ?2.  Antithrombotics: ?-DVT/anticoagulation:  Pharmaceutical: Other (comment)Eliquis ?            -antiplatelet therapy: none ?3. Shoulder Pain: continue Tylenol prn, Robaxin and Neurotin scheduled, Lidoderm patch.  ?4. Mood: LCSW to evaluate and provide emotional support ?            --continue sertraline 50 mg daily ?            -antipsychotic agents: n/a ?5. Neuropsych: This patient is capable of making decisions on her own behalf. ?6. Skin/Wound Care: Routine skin care checks ?7. Fluids/Electrolytes/Nutrition: Routine Is and Os and follow-up chemistries ?8: Left-sided spasticity: changed baclofen to 10mg  HS ?9: Dysphagia: continue dys 2 thin liquid diet, provided education regarding nutritious D2 foods. 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 Increase magnesium gluconate to 500mg  HS. Continue Cozaar 25mg  daily. ?12. Constipation: increase magnesium gluconate to 500mg  HS. Provided with list of high fiber foods ?13. Hypokalemia: decrease klor to 20mg  daily. K+ 3.8, continue this dose.  Discussed with her daughter giving her smoothies with fruits at home instead of the supplement ?14. Daytime somnolence: changed baclofen to 10mg  HS ?15. Left sided inattention, poor visual scanning: continue to encourage turning to left.  ?  ?  ? ?LOS: ?8 days ?A FACE TO FACE EVALUATION WAS  PERFORMED ? ?Clint Bolder P Trissa Molina ?04/12/2022, 8:26 AM  ? ?  ?

## 2022-04-12 NOTE — Patient Care Conference (Signed)
Inpatient RehabilitationTeam Conference and Plan of Care Update ?Date: 04/12/2022   Time: 11:16 AM  ? ? ?Patient Name: Sarah Myers      ?Medical Record Number: [id number]  ?Date of Birth: Jul 12, 1971 ?Sex: Female         ?Room/Bed: 4M01C/4M01C-01 ?Payor Info: Payor: [place] / Plan: [place] / Product Type: *No Product type* /   ? ?Admit Date/Time:  04/04/2022  3:17 PM ? ?Primary Diagnosis:  ICH (intracerebral hemorrhage) ([place]) ? ?Hospital Problems: Principal Problem: ?  ICH (intracerebral hemorrhage) ([place]) ? ? ? ?Expected Discharge Date: Expected Discharge Date: 04/26/22 ? ?Team Members Present: ?Physician leading conference: Dr. Leeroy Cha ?Social Worker Present: Ovidio Kin, LCSW ?Nurse Present: Dorien Chihuahua, RN ?PT Present: Page Spiro, PT ?SLP Present: Other (comment) Gunnar Fusi, SLP) ?PPS Coordinator present : Gunnar Fusi, SLP ? ?   Current Status/Progress Goal Weekly Team Focus  ?Bowel/Bladder ? ? cont/incont B/B LBM 4/25  maintain continence  asses q shift and prn   ?Swallow/Nutrition/ Hydration ? ? Dysphagia 2 with thin liquids  Mod I for consuming least restrictive diet and Supervision for participation in dysphagia tx.  Ice chip and Dysphagia 3 trials   ?ADL's ? ? min-mod A functional transfers; min-modA UB self care (initiated use of LHS for bathing); mod-max A for LB self care; Right gaze; left inattention; left neglect; Poor visual scanning and pursuits needs max cues and then able to locate items; Increased flexor tone LUE/LLE; Apathy increasing towards therapy.  min assist  self care training with hemi strategies, functional transfer training and balance, neuro re-ed, visual scanning/left attention training, caregiver and pt training/edu, dc planning   ?Mobility ? ? modA bed mobility, CGA unsupported sitting balance, modA sit<>stand and squat<>pivot transfers. Gait using R hand rail with maxA and +2 for chair follow. Has been apathetic towards therapy with limited  engagement - will find excuses to avoid leaving her room and does not like to go the the rehab gyms. Continues to have significant L neglect, decreased insight into her deficits, L sided weakness. Increase in tone on her LLE and LUE - she has an adjustable night splint for LE. SHE HAS FULL FLIGHT OF STEPS TO ENTER APARTMENT - have talked to daughter about trying to begin process to get handicap/ground level apt.....  minA overall  Activity tolerance, participation, functional transfers, LLE NMR, gait training, visual scanning, safety awareness   ?Communication ? ? N/A  N/A  N/A   ?Safety/Cognition/ Behavioral Observations ? Min to Mod A for visual scanning to L, Min A to Supervision for sustained attention, Mod to Max A for awareness and problem-solving  Min A for above mentioned areas  functional problem-solving, attention, and awareness tasks   ?Pain ? ? denies pain  remain pain free  assess q shift and prn   ?Skin ? ? skin intackt  prevent new break down  assess q shift and prn   ? ? ?Discharge Planning:  ?Home with [name] who is here daily and at times stays here with her. Other two daughter's will assist when able. Pt on neuro-psych list to be seen monday   ?Team Discussion: ?Patient with HTN controlled, hypokalemia addressed with a supplement per MD. Baclofen for spasticity and Bil PE treated. Continue to note pain in left shoulder and hand and flat affect. Patient is self limiting, apathetic with limited engagement in activities. On Zoloft for depression however progress limited by left neglect, poor insight into deficits, visual deficits  and tone in left LE. ? ?Patient on target to meet rehab goals: ?no, currently mod assist overall with CGA for sitting. Complete squat pivots transfers with mod assist and gait training with max assist.  ? ?*See Care Plan and progress notes for long and short-term goals.  ? ?Revisions to Treatment Plan:  ?Kinesio tape  ?D3 trials ?Night splnts ? ?Teaching Needs: ?Safety,  medications,transfers, toileting, etc  ?Current Barriers to Discharge: ?Decreased caregiver support, Home enviroment access/layout, and Incontinence ? ?Possible Resolutions to Barriers: ?Family education ?  ? ? Medical Summary ?Current Status: depression, hyypertension, daytime somnolence, hypokalemia, bilateral pulmonary embolism, left sided spasticity ? Barriers to Discharge: Medical stability;Wound care ? Barriers to Discharge Comments: depression, hyypertension, daytime somnolence, hypokalemia, bilateral pulmonary embolism, left sided spasticity, left wrist wound ?Possible Resolutions to [employer]: started zoloft, scheduled with neuropsych, change baclofen to 10mg  HS, continue Eliquis, continue medihoney, continue daily klor 69meq and BMP checks ? ? ?Continued Need for Acute Rehabilitation Level of Care: The patient requires daily medical management by a physician with specialized training in physical medicine and rehabilitation for the following reasons: ?Direction of a multidisciplinary physical rehabilitation program to maximize functional independence : Yes ?Medical management of patient stability for increased activity during participation in an intensive rehabilitation regime.: Yes ?Analysis of laboratory values and/or radiology reports with any subsequent need for medication adjustment and/or medical intervention. : Yes ? ? ?I attest that I was present, lead the team conference, and concur with the assessment and plan of the team. ? ? ?Dorien Chihuahua B ?04/12/2022, 3:30 PM  ? ? ? ? ? ? ?

## 2022-04-12 NOTE — Progress Notes (Signed)
Physical Therapy Session Note ? ?Patient Details  ?Name: Sarah Myers ?MRN: [id number] ?Date of Birth: 05/12/1971 ? ?Today's Date: 04/12/2022 ?PT Individual Time: [id number] ?PT Individual Time Calculation (min): 60 min  ? ?Short Term Goals: ?Week 2:  PT Short Term Goal 1 (Week 2): Pt will complete bed mobility with minA and LRAD ?PT Short Term Goal 2 (Week 2): Pt will complete bed<>chair transfers with minA and LRAD ?PT Short Term Goal 3 (Week 2): Pt will ambulate 54ft with maxA and LRAD ?PT Short Term Goal 4 (Week 2): Pt will initiate stair training ? ?Skilled Therapeutic Interventions/Progress Updates: Pt presented in recliner attempting to get out of chair. Pt stating butt sore and would like to get out. Pt agreeable to therapy and requesting to work on gait training. PTA donned shoes total A for time management and performed Stedy transfer to w/c. Pt transported to rehab gym and performed Sit to stand in parallel bars. Pt unable to actively engage quad to full extension therefore PTA blocked knee in standing and had pt perform lateral weight shifting L for increased weight bearing through LLE. Pt then stepped with RLE and then attempted advancing LLE. Pt noted to have increased hip rotation to R requiring max facilitation for correction. Pt then transported to hallway and participated in gait training with wall rail. Pt was minA for standing but required PTA in front to pt stabilizing hips to minimize rotation. Pt did require total A for advancement of LLE however PTA was able to maintain neutral hips and take several small steps with RLE. Pt then indicated increased fatigue and seated rest break provided. Pt then instructed in w/c mobility via hemi technique. Pt was able to propel ~63ft with modA with PTA providing assist for sequencing of RLE and PTA providing verbal cues for turning head to L for improved trajectory. Pt then indicating increased nausea. Pt provided with emesis bag with pt requesting ginger  ale. Pt did not want to request meds due to possibility of throwing up. Pt sat in w/c for a few minutes with no output but continued with significant nausea. Pt agreeable to remain in w/c until nausea passed with belt alarm on, call bell within reach and PTA notifying [place], pt's nurse of pt's disposition.  ?   ? ?Therapy Documentation ?Precautions:  ?Precautions ?Precautions: Fall ?Precaution Comments: L inattention, PEG, left hemi, right head gaze ?Restrictions ?Weight Bearing Restrictions: No ?General: ?  ?Vital Signs: ?  ?Pain: ?  ?Mobility: ?  ?Locomotion : ?   ?Trunk/Postural Assessment : ?   ?Balance: ?  ?Exercises: ?  ?Other Treatments:   ? ? ? ?Therapy/Group: Individual Therapy ? ?Ellison Leisure ?04/12/2022, 3:26 PM  ?

## 2022-04-13 DIAGNOSIS — I619 Nontraumatic intracerebral hemorrhage, unspecified: Secondary | ICD-10-CM | POA: Diagnosis not present

## 2022-04-13 LAB — BASIC METABOLIC PANEL
Anion gap: 9 (ref 5–15)
BUN: 10 mg/dL (ref 6–20)
CO2: 22 mmol/L (ref 22–32)
Calcium: 11.1 mg/dL — ABNORMAL HIGH (ref 8.9–10.3)
Chloride: 107 mmol/L (ref 98–111)
Creatinine, Ser: 0.92 mg/dL (ref 0.44–1.00)
GFR, Estimated: >60 mL/min (ref >60)
Glucose, Bld: 119 mg/dL — ABNORMAL HIGH (ref 70–99)
Potassium: 3.8 mmol/L (ref 3.5–5.1)
Sodium: 138 mmol/L (ref 135–145)

## 2022-04-13 LAB — GLUCOSE, CAPILLARY
Glucose-Capillary: 112 mg/dL — ABNORMAL HIGH (ref 70–99)
Glucose-Capillary: 118 mg/dL — ABNORMAL HIGH (ref 70–99)
Glucose-Capillary: 150 mg/dL — ABNORMAL HIGH (ref 70–99)
Glucose-Capillary: 74 mg/dL (ref 70–99)

## 2022-04-13 MED ORDER — BACLOFEN 5 MG HALF TABLET
5.0000 mg | ORAL_TABLET | Freq: Every day | ORAL | Status: DC
  Administered 2022-04-13 – 2022-04-25 (×13): 5 mg via ORAL
  Filled 2022-04-13 (×13): qty 1

## 2022-04-13 NOTE — Progress Notes (Signed)
Patient with complaints of left posterior knee/distal thigh. Increased tone noted. Discussed with Dr. Carlis Abbott. Will add baclofen 5 mg now and daily at 2 pm. ?

## 2022-04-13 NOTE — Progress Notes (Signed)
Pt repositioned with pillows and resting in bed at this time. Denies new pain. RUE splint and RLE boot placed on the pt. Pt tolerating at this time.  ?

## 2022-04-13 NOTE — Progress Notes (Signed)
Speech Language Pathology Weekly Progress ? ?Patient Details  ?Name: Sarah Myers ?MRN: [id number] ?Date of Birth: 1971/12/18 ? ?Beginning of progress report period: April 05, 2022 ?End of progress report period: April 12, 2022 ? ?Today's Date: 04/13/2022 ?SLP Individual Time: 1000-1100 ?SLP Individual Time Calculation (min): 60 min ? ?Short Term Goals: ?Week 1: SLP Short Term Goal 1 (Week 1): Patient will consume current diet with minimal overt s/s of aspiration with supervision level verbal cues for use of swallowing compensatory strategies. ?SLP Short Term Goal 1 - Progress (Week 1): Met ?SLP Short Term Goal 2 (Week 1): Patient will demonstrate efficient mastication and complete oral clearance with trials of Dys. 3 textures without overt s/s of aspiration with supervision level verbal cues over 2 sessions prior to upgrade. ?SLP Short Term Goal 2 - Progress (Week 1): Progressing toward goal ?SLP Short Term Goal 3 (Week 1): Patient will demonstrate sustained attention to functional tasks for 10 minutes with Mod verbal cues for redirection. ?SLP Short Term Goal 3 - Progress (Week 1): Progressing toward goal ?SLP Short Term Goal 4 (Week 1): Patient will demonstrate functional problem solving for basic and familiar tasks with mod verbal cues. ?SLP Short Term Goal 4 - Progress (Week 1): Progressing toward goal ?SLP Short Term Goal 5 (Week 1): Patient will attend/scan to midline during functional tasks with Mod verbal cues. ?SLP Short Term Goal 5 - Progress (Week 1): Met ? ?  ?New Short Term Goals: ?Week 2: SLP Short Term Goal 1 (Week 2): Patient will demonstrate efficient mastication and complete oral clearance with trials of Dys. 3 textures without overt s/s of aspiration with supervision level verbal cues over 2 sessions prior to upgrade. ?SLP Short Term Goal 2 (Week 2): Patient will demonstrate sustained attention to functional tasks for 10 minutes with Mod verbal cues for redirection. ?SLP Short Term Goal 3  (Week 2): Patient will demonstrate functional problem solving for basic and familiar tasks with mod verbal cues. ?SLP Short Term Goal 4 (Week 2): Patient will attend/scan to midline during functional tasks with Min verbal cues. ? ?Weekly Progress Updates: ?Pt demonstrates steady, functional gains and has met 2 out of 5 short-term goals this past week. Barriers include ongoing fatigue, visual deficits, severity of deficits, and decreased emergent awareness of deficits. Pt participatory and engaged in treatment, as able. Pt education ongoing; family has not been present during [name] intervention for family training/education this pas week. Continue per updated POC. ? ? ?Intensity: Minumum of 1-2 x/day, 30 to 90 minutes ?Frequency: 3 to 5 out of 7 days ?Duration/Length of Stay: 3-4 weeks ?Treatment/Interventions: Cognitive remediation/compensation;Internal/external aids;Dysphagia/aspiration precaution training;Cueing hierarchy;Environmental controls;Therapeutic Activities;Functional tasks;Patient/family education;Speech/Language facilitation ? ?Elizabethann Lackey A Annaston Upham ?04/13/2022, 12:46 PM ? ? ? ? ? ? ?

## 2022-04-13 NOTE — Progress Notes (Signed)
?                                                       PROGRESS NOTE ? ? ?Subjective/Complaints: ?Shoulder is not hurting- using lidocaine patch at night to help prevent pain ?BP under excellent control this AM ?Discussed changes in spasticity medication ? ?ROS:  ? ?Pt denies SOB, abd pain, CP, N/V/C/D, and vision changes, + pain in hand intermittently, shoulder pain resolved ? ?Objective: ?  ?No results found. ?No results for input(s): WBC, HGB, HCT, PLT in the last 72 hours. ? ? ?No results for input(s): NA, K, CL, CO2, GLUCOSE, BUN, CREATININE, CALCIUM in the last 72 hours. ? ? ?Intake/Output Summary (Last 24 hours) at 04/13/2022 0715 ?Last data filed at 04/12/2022 2057 ?Gross per 24 hour  ?Intake 240 ml  ?Output --  ?Net 240 ml  ? ?  ? ?  ? ?Physical Exam: ?Vital Signs ?Blood pressure 122/72, pulse 63, temperature 98.1 ?F (36.7 ?C), temperature source Oral, resp. rate 16, height 5\' 4"  (1.626 m), weight 69 kg, SpO2 97 %. ? ?General: awake, alert, appropriate,sitting up in bed; leaning to left; BMI 26.11 ?HENT: conjugate gaze; oropharynx moist, improved visual scanning to the left ?CV: regular rate; no JVD ?Pulmonary: CTA B/L; no W/R/R- good air movement ?GI: soft, NT, ND, (+)BS ?Psychiatric: appropriate- but flat ?Neurological: L head tilt/leaning to L- L inattention- no change ?Musculoskeletal: Patch on L shoulder ?   Cervical back: Neck supple. No tenderness.  ?   Comments: RUE- 4/5 or more- hard to test ?RLE 3+/5 proximally and 4-/5 distally ?LUE- 0/5 ?LLE- 2-/5 in HF and PF only ?  ?Skin: ?   General: Skin is warm and dry.  ?   Comments: Scab 1.5 cm on L forearm- otherwise skin looks OK  ?Neurological:  ?   Mental Status: She is alert and oriented to person, place, and time.  ?   Comments: Severe L inattention/neglect ?Intact to light touch in all 4 extremities ?Dysconjugate gaze, but denies double vision.  ?MAS of 2-3 in LUE_ shoulder MAS of 3; elbow 2-3 and MAS of 2 in L wrist ?MAS of 1+ to 2 in LLE ?Puffy  L hand ?Squat pivot modA ?3 beats clonus LLE and (+) hoffman's LUE  ?Psychiatric:     ?   Mood and Affect: Depressed mood ?   Comments: vague  ? ? ?Assessment/Plan: ?1. Functional deficits which require 3+ hours per day of interdisciplinary therapy in a comprehensive inpatient rehab setting. ?Physiatrist is providing close team supervision and 24 hour management of active medical problems listed below. ?Physiatrist and rehab team continue to assess barriers to discharge/monitor patient progress toward functional and medical goals ? ?Care Tool: ? ?Bathing ?   ?Body parts bathed by patient: Front perineal area, Buttocks  ? Body parts bathed by helper: Buttocks, Front perineal area ?  ?  ?Bathing assist Assist Level: Maximal Assistance - Patient 24 - 49% ?  ?  ?Upper Body Dressing/Undressing ?Upper body dressing   ?What is the patient wearing?: [place] only ?   ?Upper body assist Assist Level: Moderate Assistance - Patient 50 - 74% ?   ?Lower Body Dressing/Undressing ?Lower body dressing ? ? ?   ?What is the patient wearing?: [place] only ? ?  ? ?Lower body  assist Assist for lower body dressing: Maximal Assistance - Patient 25 - 49% ?   ? ?Toileting ?Toileting    ?Toileting assist Assist for toileting: Total Assistance - Patient < 25% ?  ?  ?Transfers ?Chair/bed transfer ? ?Transfers assist ?   ? ?Chair/bed transfer assist level: Moderate Assistance - Patient 50 - 74% ?  ?  ?Locomotion ?Ambulation ? ? ?Ambulation assist ? ?   ? ?Assist level: Moderate Assistance - Patient 50 - 74% ?Assistive device: Parallel bars ?Max distance: 40ft  ? ?Walk 10 feet activity ? ? ?Assist ? Walk 10 feet activity did not occur: Safety/medical concerns ? ?  ?   ? ?Walk 50 feet activity ? ? ?Assist Walk 50 feet with 2 turns activity did not occur: Safety/medical concerns ? ?  ?   ? ? ?Walk 150 feet activity ? ? ?Assist Walk 150 feet activity did not occur: Safety/medical concerns ? ?  ?  ?  ? ?Walk 10 feet on uneven surface   ?activity ? ? ?Assist Walk 10 feet on uneven surfaces activity did not occur: Safety/medical concerns ? ? ?  ?   ? ?Wheelchair ? ? ? ? ?Assist Is the patient using a wheelchair?: Yes ?Type of Wheelchair: Manual ?  ? ?Wheelchair assist level: Dependent - Patient 0% ?Max wheelchair distance: 117ft  ? ? ?Wheelchair 50 feet with 2 turns activity ? ? ? ?Assist ? ?  ?  ? ? ?Assist Level: Dependent - Patient 0%  ? ?Wheelchair 150 feet activity  ? ? ? ?Assist ?   ? ? ?Assist Level: Dependent - Patient 0%  ? ?Blood pressure 122/72, pulse 63, temperature 98.1 ?F (36.7 ?C), temperature source Oral, resp. rate 16, height 5\' 4"  (1.626 m), weight 69 kg, SpO2 97 %. ? ?Medical Problem List and Plan: ?1. 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            -patient may  shower- cover PEG ?            -ELOS/Goals: 14-16 days min A ? HFU scheduled ? Continue CIR- PT, OT and SLP ?2.  Antithrombotics: ?-DVT/anticoagulation:  Pharmaceutical: Other (comment)Eliquis ?            -antiplatelet therapy: none ?3. Shoulder Pain: continue Tylenol prn, Robaxin and Neurotin scheduled, Lidoderm patch.  ?4. Mood: LCSW to evaluate and provide emotional support ?            --continue sertraline 50 mg daily ?            -antipsychotic agents: n/a ?5. Neuropsych: This patient is capable of making decisions on her own behalf. ?6. Skin/Wound Care: Routine skin care checks ?7. Fluids/Electrolytes/Nutrition: Routine Is and Os and follow-up chemistries ?8: Left-sided spasticity: changed baclofen to 10mg  HS. Kpad ordered.  ?9: Dysphagia: continue dys 2 thin liquid diet, provided education regarding nutritious D2 foods. 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 Increase magnesium gluconate to 500mg  HS. Continue Cozaar 25mg  daily. ?12. Constipation: increase magnesium gluconate to 500mg  HS. Provided with list of high fiber  foods ?13. Hypokalemia: decrease klor to 20mg  daily. K+ 3.8, continue this dose. Discussed with her daughter giving her smoothies with fruits at home instead of the supplement. Check BMP today. ?14. Daytime somnolence: changed baclofen to 10mg  HS ?15. Left sided inattention, poor visual scanning: continue to encourage turning to left.  ?  ?  ? ?LOS: ?  9 days ?A FACE TO FACE EVALUATION WAS PERFORMED ? ?Martha Clan P Lidie Glade ?04/13/2022, 7:15 AM  ? ?  ?

## 2022-04-13 NOTE — Progress Notes (Signed)
Occupational Therapy Weekly Progress Note ? ?Patient Details  ?Name: Sarah Myers ?MRN: [id number] ?Date of Birth: March 31, 1971 ? ?Beginning of progress report period: April 05, 2022 ?End of progress report period: April 13, 2022 ? ?Today's Date: 04/13/2022 ?OT Individual Time: [id number] ?OT Individual Time Calculation (min): 55 min  ? ? ?Patient has met 4 of 4 short term goals.  Pt is making steady gains in skilled OT evidence by increased independence with UB dressing from mod A to now min A and good follow through of hemi strategies, improved independence with squat pivot transfers and LB dressing from max to now min-mod A, and improved ability to visually scan and attend to left environment, still requiring cues though.  Pt did exhibit increased lethargy and apathy at the beginning of this past week, however after collaborating with interdisciplinary team and rescheduling of Baclofen to night time, pt exhibits increased alertness now with improved participation noted today.   ? ?Patient continues to demonstrate the following deficits: muscle joint tightness and muscle paralysis, decreased cardiorespiratoy endurance, abnormal tone, unbalanced muscle activation, motor apraxia, decreased coordination, and decreased motor planning, decreased visual acuity, decreased visual perceptual skills, decreased visual motor skills, and field cut, decreased attention to left, left side neglect, and decreased motor planning, decreased initiation, decreased attention, decreased awareness, decreased problem solving, decreased safety awareness, decreased memory, and delayed processing, and decreased sitting balance, decreased standing balance, decreased postural control, hemiplegia, and decreased balance strategies and therefore will continue to benefit from skilled OT intervention to enhance overall performance with BADL. ? ?Patient progressing toward long term goals..  Continue plan of care. ? ?OT Short Term Goals ?Week 1:  OT  Short Term Goal 1 (Week 1): Pt will complete UB dressing with min assist ?OT Short Term Goal 1 - Progress (Week 1): Met ?OT Short Term Goal 2 (Week 1): Pt will complete squat pivot BSC transfer with mod assist ?OT Short Term Goal 2 - Progress (Week 1): Met ?OT Short Term Goal 3 (Week 1): Pt will complete LB dressing with mod assist ?OT Short Term Goal 3 - Progress (Week 1): Met ?OT Short Term Goal 4 (Week 1): Pt will complete oral hygiene with setup and min VCs for visual scanning and item retrieval. ?OT Short Term Goal 4 - Progress (Week 1): Met ?Week 2:  OT Short Term Goal 1 (Week 2): Pt will complete stand pivot to [facility] with min assist. ?OT Short Term Goal 2 (Week 2): Pt will complete LB dressing with min assist. ?OT Short Term Goal 3 (Week 2): Pt will scan and retrieve ADL items in all fields of vision with min multimodal cues. ?OT Short Term Goal 4 (Week 2): Pt will complete toileting with min assist. ? ?Skilled Therapeutic Interventions/Progress Updates:  ?  Subjective: Pt states she already completed all self care and agreeable to working on visual scanning in dayroom.     ?  Objective: Transported via w/c to dayroom. Pt completed tabletop activities to facilitate scanning and attention to left visual field using card matching and poker chip color organizing and matching.  Pt required mod verbal cues to facilitate visual scanning to the left.  Pt had difficulty reading numbers on cards even when placed in midline view therefore downgraded to poker chip sorting.  Pt instructed in AAROM right lateral flexion of neck to self stretch upper traps.  Pt also completed 2 x 10 reps left right rotation of neck needing tactile cues to prevent tilting at end range  to the left.  Transported back to room and provided max reqular multimodal cues to encourage left attention due to right gaze preference.  Pt asking to get back in bed, stand pivot to EOB with mod assist and cues to prevent premature sitting.  Sit to supine with  CGA. Call bell in reach, seat alarm on.   ? ? ?Therapy Documentation ?Precautions:  ?Precautions ?Precautions: Fall ?Precaution Comments: L inattention, PEG, left hemi, right head gaze ?Restrictions ?Weight Bearing Restrictions: No ? ? ? ?Therapy/Group: Individual Therapy ? ?Sarah Myers ?04/13/2022, 11:00 AM  ?

## 2022-04-13 NOTE — Progress Notes (Signed)
Occupational Therapy Session Note ? ?Patient Details  ?Name: Sarah Myers ?MRN: [id number] ?Date of Birth: 1971-04-19 ? ?Today's Date: 04/13/2022 ?OT Individual Time: 7616-0737 ?OT Individual Time Calculation (min): 38 min  ? ? ?Short Term Goals: ?Week 1:  OT Short Term Goal 1 (Week 1): Pt will complete UB dressing with min assist ?OT Short Term Goal 2 (Week 1): Pt will complete squat pivot BSC transfer with mod assist ?OT Short Term Goal 3 (Week 1): Pt will complete LB dressing with mod assist ?OT Short Term Goal 4 (Week 1): Pt will complete oral hygiene with setup and min VCs for visual scanning and item retrieval. ? ?Skilled Therapeutic Interventions/Progress Updates:  ?Skilled OT intervention completed with focus on toileting, R hand hygiene. Pt received upright in bed, c/o unrated pain in LLE with request of assist for repositioning pillow under leg for comfort. PA in room, with pt able to report the discomfort with increased tone noted with therapist lifting leg, with PA asking pt about spasms meds and pt unable to recall. PA with plan to initiate new meds for assisting with spasms. ? ?According to timed toileting board, pt was due for toileting at 8 AM, and had been 4+ hours over due time when therapist received pt. Pt reporting feeling like she needed to be changed, with pt found to be dry, however still requesting to change brief. Pt able to assist with rolling R<>L with CGA for towards L and Min A needed > R, with therapist completing all toileting at total A. No incontinence noted, however upon posteriorly wiping, small smear noted, with pt wanting to attempt BM on bed pan but unsuccessful. Bed placed in trendelenburg position with max A scooting needed towards [facility] with education provided on using R leg on bed, and pushing with R arm to assist. Positioned pt with pillows to BLE to prevent external rotation, LUE propped on pillows for edema management. Pt's hand with tone, with pt attempting to  physically open her fingers, with pt's skin in the palm found to be dirty and flaky. Education provided about affected hand hygiene, 2/2 tendency for affected hands to get neglected with self-care. With therapist assisting with palm position, pt cleaned hand with min A and ensured dryness. Pt declined doffing hand splint, but agreeable to have folded wash cloth placed for positioning to prevent contractures. Pt was left upright in bed, with bed alarm on and all needs in reach at end of session.  ? ? ?Therapy Documentation ?Precautions:  ?Precautions ?Precautions: Fall ?Precaution Comments: L inattention, PEG, left hemi, right head gaze ?Restrictions ?Weight Bearing Restrictions: No ? ? ? ? ? ?Therapy/Group: Individual Therapy ? ?[name] E Peytin Dechert ?04/13/2022, 7:52 AM ?

## 2022-04-13 NOTE — Progress Notes (Signed)
Speech Language Pathology Daily Session Note ? ?Patient Details  ?Name: Sarah Myers ?MRN: [id number] ?Date of Birth: 10/23/1971 ? ?Today's Date: 04/13/2022 ?SLP Individual Time: 1000-1100 ?SLP Individual Time Calculation (min): 60 min ? ?Short Term Goals: ?Week 1: SLP Short Term Goal 1 (Week 1): Patient will consume current diet with minimal overt s/s of aspiration with supervision level verbal cues for use of swallowing compensatory strategies. ?SLP Short Term Goal 2 (Week 1): Patient will demonstrate efficient mastication and complete oral clearance with trials of Dys. 3 textures without overt s/s of aspiration with supervision level verbal cues over 2 sessions prior to upgrade. ?SLP Short Term Goal 3 (Week 1): Patient will demonstrate sustained attention to functional tasks for 10 minutes with Mod verbal cues for redirection. ?SLP Short Term Goal 4 (Week 1): Patient will demonstrate functional problem solving for basic and familiar tasks with mod verbal cues. ?SLP Short Term Goal 5 (Week 1): Patient will attend/scan to midline during functional tasks with Mod verbal cues. ? ?Skilled Therapeutic Interventions: ?Pt seen this date or skilled ST intervention targeting dysphagia and cognitive-linguistic goals outlined above. Pt encountered lying in bed asleep, aroused after several minutes and use of wet washcloth. Reports medications are making her groggy; MD aware. Agreeable to ST intervention. ? ?SLP facilitated today's session by providing the following skilled interventions within the context of functional and therapeutic tx tasks: compensatory strategy training, mass practice, multimodal cues, and reinforcement of aspiration risk and precautions. ? ?SLP provided trials of Dysphagia 3 textures, with pt demonstrating small bites with Mod I, alternating bites/sips with Min verbal A, Mod A for attention to task and motor initiation, Max A for visual scanning, and Total A for repositioning. Tolerated Dysphagia 3  textures, though consumption was limited. Recommend ongoing monitoring prior to diet upgrade given generalized orofacial weakness and ongoing fatigue, which likely limited pt's performance this date. No clinical s/sx concerning for aspiration. ? ?Attended to L field with overall Min-Mod verbal and visual A. Required Max to Total A for color identification during therapeutic task, and Min A for sustained attention for up to 10 minutes. Unable to read print despite donning glasses and providing large print. Appears to see some objects, though question if it is more of an outline; pt only able to state names of larger objects in her room (e.g. doors, TV, curtains).  ? ?Session concluded with pt in bed, bed alarm on, call bell reviewed and within reach, and all immediate needs met. Continue per current ST POC. ? ?Pain ?No reports of pain; NAD ? ?Therapy/Group: Individual Therapy ? ?Latorsha Curling A Niccole Witthuhn ?04/13/2022, 12:23 PM ?

## 2022-04-13 NOTE — Progress Notes (Signed)
Physical Therapy Session Note ? ?Patient Details  ?Name: Sarah Myers ?MRN: [id number] ?Date of Birth: 1971-06-23 ? ?Today's Date: 04/13/2022 ?PT Individual Time: 1712-7871 ?PT Individual Time Calculation (min): 46 min  ? ?Short Term Goals: ?Week 2:  PT Short Term Goal 1 (Week 2): Pt will complete bed mobility with minA and LRAD ?PT Short Term Goal 2 (Week 2): Pt will complete bed<>chair transfers with minA and LRAD ?PT Short Term Goal 3 (Week 2): Pt will ambulate 70f with maxA and LRAD ?PT Short Term Goal 4 (Week 2): Pt will initiate stair training ? ?Skilled Therapeutic Interventions/Progress Updates: Pt presented in bed agreeable to therapy. Pt requesting to wash up prior to leaving room. PTA set up wash basin and doffed brief. Pt was able to perform peri-care with supervision with pt rolling to R with use of bed rail to allow PTA to clean buttocks. PTA donned fresh brief and pt performed supine to sit with CGA and use of bed features. Pt doffed shirt with modA and performed UB cleaning with was cloth and minA to clean unaffected side. Pt required modA to don clean shirt however was able to maintain supervision sitting balance throughout. PTA provided maxA to thread pants and used Stedy to perform Sit to stand to pull pants over hips and transfer to w/c. Once in w/c pt performed oral hygiene at sink with modA for placing toothpaste on toothbrush and PTA placing items on L to force pt to scan to L to obtain necessary items. Pt then moved to hallway and performed w/c mobilty with modA ~610f Pt demonstrated improved sequencing however continues to require assist for effective use of RLE to maintain straight trajectory. Pt then handed off to OT at end of session with nsg present and current needs met.  ?   ? ?Therapy Documentation ?Precautions:  ?Precautions ?Precautions: Fall ?Precaution Comments: L inattention, PEG, left hemi, right head gaze ?Restrictions ?Weight Bearing Restrictions: No ?General: ?  ?Vital  Signs: ?Therapy Vitals ?Temp: 98.1 ?F (36.7 ?C) ?Temp Source: Oral ?Pulse Rate: 63 ?Resp: 16 ?BP: 122/72 ?Patient Position (if appropriate): Lying ?Oxygen Therapy ?SpO2: 97 % ?O2 Device: Room Air ? ?Therapy/Group: Individual Therapy ? ?Melanee Cordial ?04/13/2022, 9:27 AM  ?

## 2022-04-14 DIAGNOSIS — I619 Nontraumatic intracerebral hemorrhage, unspecified: Secondary | ICD-10-CM | POA: Diagnosis not present

## 2022-04-14 LAB — GLUCOSE, CAPILLARY
Glucose-Capillary: 105 mg/dL — ABNORMAL HIGH (ref 70–99)
Glucose-Capillary: 120 mg/dL — ABNORMAL HIGH (ref 70–99)
Glucose-Capillary: 95 mg/dL (ref 70–99)
Glucose-Capillary: 88 mg/dL (ref 70–99)

## 2022-04-14 MED ORDER — SERTRALINE HCL 50 MG PO TABS
50.0000 mg | ORAL_TABLET | Freq: Every day | ORAL | Status: DC
  Administered 2022-04-15 – 2022-04-25 (×11): 50 mg via ORAL
  Filled 2022-04-14 (×11): qty 1

## 2022-04-14 NOTE — Progress Notes (Signed)
Speech Language Pathology Daily Session Note ? ?Patient Details  ?Name: Sarah Myers ?MRN: [id number] ?Date of Birth: 1971-02-16 ? ?Today's Date: 04/14/2022 ?SLP Individual Time: 0905-1000 ?SLP Individual Time Calculation (min): 55 min ? ?Short Term Goals: ?Week 2: SLP Short Term Goal 1 (Week 2): Patient will demonstrate efficient mastication and complete oral clearance with trials of Dys. 3 textures without overt s/s of aspiration with supervision level verbal cues over 2 sessions prior to upgrade. ?SLP Short Term Goal 2 (Week 2): Patient will demonstrate sustained attention to functional tasks for 10 minutes with Mod verbal cues for redirection. ?SLP Short Term Goal 3 (Week 2): Patient will demonstrate functional problem solving for basic and familiar tasks with mod verbal cues. ?SLP Short Term Goal 4 (Week 2): Patient will attend/scan to midline during functional tasks with Min verbal cues. ? ?Skilled Therapeutic Interventions:Skilled ST services focused on swallow and cognitive skills. Pt requested to use the bathroom and change clothes due to incontinence. SLP, nurse and pt's daughter assisted. Pt was able to follow commands and demonstrate basic problem solving during task mentioned above with mod A verbal cues. Pt expressed medication impacting fatigue and vision, SLP notified nursing staff. SLP facilitated PO intake of dys 1 textures on breakfast tray was well as a limited dys 3 textured snack. Pt demonstrated ability to consume small bites and appropriate oral clearance. Pt expressed fear of advancing solids due to limited mastication, SLP provided education how to compensate for impairments with use of small bites, alternating liquids and solids as well as utilizing sticky bolus to aid in cohesion. Pt is agreeable to dys 3 texture trial tray on Monday. Pt was left in room with daughter, call bell within reach and chair alarm set. SLP recommends to continue skilled services. ? ?   ? ?Pain ?Pain  Assessment ?Pain Score: 0-No pain ? ?Therapy/Group: Individual Therapy ? ?Havah Ammon ?04/14/2022, 2:30 PM ?

## 2022-04-14 NOTE — Progress Notes (Signed)
?                                                       PROGRESS NOTE ? ? ?Subjective/Complaints: ?No new complaints this morning ?Yesterday she was expressing increased pain behind her left knee due to tightness from spasticity ? ?ROS:  ? ?Pt denies SOB, abd pain, CP, N/V/C/D, and vision changes, + pain in hand intermittently, shoulder pain resolved, +left sided pain from spasticity ? ?Objective: ?  ?No results found. ?No results for input(s): WBC, HGB, HCT, PLT in the last 72 hours. ? ? ?Recent Labs  ?  04/13/22 ?1134  ?NA 138  ?K 3.8  ?CL 107  ?CO2 22  ?GLUCOSE 119*  ?BUN 10  ?CREATININE 0.92  ?CALCIUM 11.1*  ? ? ? ?Intake/Output Summary (Last 24 hours) at 04/14/2022 0900 ?Last data filed at 04/13/2022 1302 ?Gross per 24 hour  ?Intake 177 ml  ?Output --  ?Net 177 ml  ? ?  ? ?  ? ?Physical Exam: ?Vital Signs ?Blood pressure (!) 141/79, pulse (!) 59, temperature 98.3 ?F (36.8 ?C), resp. rate 16, height 5\' 4"  (1.626 m), weight 69.2 kg, SpO2 98 %. ? ?General: awake, alert, appropriate,sitting up in bed; leaning to left; BMI 26.11 ?HENT: conjugate gaze; oropharynx moist, improved visual scanning to the left ?CV: bradycardia ?Pulmonary: CTA B/L; no W/R/R- good air movement ?GI: soft, NT, ND, (+)BS ?Psychiatric: appropriate- but flat ?Neurological: L head tilt/leaning to L- L inattention- no change ?Musculoskeletal: Patch on L shoulder ?   Cervical back: Neck supple. No tenderness.  ?   Comments: RUE- 4/5 or more- hard to test ?RLE 3+/5 proximally and 4-/5 distally ?LUE- 0/5 ?LLE- 2-/5 in HF and PF only ?  ?Skin: ?   General: Skin is warm and dry.  ?   Comments: Scab 1.5 cm on L forearm- otherwise skin looks OK  ?Neurological:  ?   Mental Status: She is alert and oriented to person, place, and time.  ?   Comments: Severe L inattention/neglect ?Intact to light touch in all 4 extremities ?Dysconjugate gaze, but denies double vision.  ?MAS of 2-3 in LUE_ shoulder MAS of 3; elbow 2-3 and MAS of 2 in L wrist ?MAS of 1+ to 2  in LLE ?Puffy L hand ?Squat pivot modA ?3 beats clonus LLE and (+) hoffman's LUE  ?Psychiatric:     ?   Mood and Affect: Depressed mood ?   Comments: vague  ? ? ?Assessment/Plan: ?1. Functional deficits which require 3+ hours per day of interdisciplinary therapy in a comprehensive inpatient rehab setting. ?Physiatrist is providing close team supervision and 24 hour management of active medical problems listed below. ?Physiatrist and rehab team continue to assess barriers to discharge/monitor patient progress toward functional and medical goals ? ?Care Tool: ? ?Bathing ?   ?Body parts bathed by patient: Front perineal area, Buttocks  ? Body parts bathed by helper: Buttocks, Front perineal area ?  ?  ?Bathing assist Assist Level: Maximal Assistance - Patient 24 - 49% ?  ?  ?Upper Body Dressing/Undressing ?Upper body dressing   ?What is the patient wearing?: Hospital gown only ?   ?Upper body assist Assist Level: Moderate Assistance - Patient 50 - 74% ?   ?Lower Body Dressing/Undressing ?Lower body dressing ? ? ?   ?What  is the patient wearing?: Hospital gown only ? ?  ? ?Lower body assist Assist for lower body dressing: Maximal Assistance - Patient 25 - 49% ?   ? ?Toileting ?Toileting    ?Toileting assist Assist for toileting: Total Assistance - Patient < 25% ?  ?  ?Transfers ?Chair/bed transfer ? ?Transfers assist ?   ? ?Chair/bed transfer assist level: Moderate Assistance - Patient 50 - 74% ?  ?  ?Locomotion ?Ambulation ? ? ?Ambulation assist ? ?   ? ?Assist level: Moderate Assistance - Patient 50 - 74% ?Assistive device: Parallel bars ?Max distance: 34ft  ? ?Walk 10 feet activity ? ? ?Assist ? Walk 10 feet activity did not occur: Safety/medical concerns ? ?  ?   ? ?Walk 50 feet activity ? ? ?Assist Walk 50 feet with 2 turns activity did not occur: Safety/medical concerns ? ?  ?   ? ? ?Walk 150 feet activity ? ? ?Assist Walk 150 feet activity did not occur: Safety/medical concerns ? ?  ?  ?  ? ?Walk 10 feet on uneven  surface  ?activity ? ? ?Assist Walk 10 feet on uneven surfaces activity did not occur: Safety/medical concerns ? ? ?  ?   ? ?Wheelchair ? ? ? ? ?Assist Is the patient using a wheelchair?: Yes ?Type of Wheelchair: Manual ?  ? ?Wheelchair assist level: Dependent - Patient 0% ?Max wheelchair distance: 116ft  ? ? ?Wheelchair 50 feet with 2 turns activity ? ? ? ?Assist ? ?  ?  ? ? ?Assist Level: Dependent - Patient 0%  ? ?Wheelchair 150 feet activity  ? ? ? ?Assist ?   ? ? ?Assist Level: Dependent - Patient 0%  ? ?Blood pressure (!) 141/79, pulse (!) 59, temperature 98.3 ?F (36.8 ?C), resp. rate 16, height 5\' 4"  (1.626 m), weight 69.2 kg, SpO2 98 %. ? ?Medical Problem List and Plan: ?1. 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            -patient may  shower- cover PEG ?            -ELOS/Goals: 14-16 days min A ? HFU scheduled ? Continue CIR- PT, OT and SLP ?2.  Antithrombotics: ?-DVT/anticoagulation:  Pharmaceutical: Other (comment)Eliquis ?            -antiplatelet therapy: none ?3. Shoulder Pain: continue Tylenol prn, Robaxin and Neurotin scheduled, Lidoderm patch.  ?4. Mood: LCSW to evaluate and provide emotional support ?            --continue sertraline 50 mg daily ?            -antipsychotic agents: n/a ?5. Neuropsych: This patient is capable of making decisions on her own behalf. ?6. Skin/Wound Care: Routine skin care checks ?7. Fluids/Electrolytes/Nutrition: Routine Is and Os and follow-up chemistries ?8: Left-sided spasticity: changed baclofen to 10mg  HS. Kpad ordered. Add 5mg  at 2pm.  ?9: Dysphagia: continue dys 2 thin liquid diet, provided education regarding nutritious D2 foods. 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 Increase magnesium gluconate to 500mg  HS. Continue Cozaar 25mg  daily. ?12. Constipation: increase magnesium gluconate to 500mg  HS. Provided with list of high  fiber foods ?13. Hypokalemia: decrease klor to 20mg  daily. K+ 3.8, continue this dose. Discussed with her daughter giving her smoothies with fruits at home instead of the supplement. Check BMP today. ?14. Daytime somnolence: changed baclofen to 10mg  HS. Discussed that we are  adding 5mg  at 2pm for her pain in her knee associated with spasticity, and to let me know if this makes her too tired.  ?15. Left sided inattention, poor visual scanning: continue to encourage turning to left.  ?16. Bradycardia: continue to monitor HR TID.  ?  ?  ? ?LOS: ?10 days ?A FACE TO FACE EVALUATION WAS PERFORMED ? ? P Bairon Klemann ?04/14/2022, 9:00 AM  ? ?  ?

## 2022-04-14 NOTE — Progress Notes (Signed)
Occupational Therapy Session Note ? ?Patient Details  ?Name: Sarah Myers ?MRN: [id number] ?Date of Birth: 02/26/1971 ? ?Today's Date: 04/14/2022 ?OT Individual Time: 1100-1200 ?OT Individual Time Calculation (min): 60 min  ? ? ?Short Term Goals: ?Week 2:  OT Short Term Goal 1 (Week 2): Pt will complete stand pivot to [facility] with min assist. ?OT Short Term Goal 2 (Week 2): Pt will complete LB dressing with min assist. ?OT Short Term Goal 3 (Week 2): Pt will scan and retrieve ADL items in all fields of vision with min multimodal cues. ?OT Short Term Goal 4 (Week 2): Pt will complete toileting with min assist. ? ?Skilled Therapeutic Interventions/Progress Updates:  ?  Subjective: Pt states she feels drowsy today but she is agreeable to attending sing along activity with daughter present for emotional support. No c/o pain during session.  ?  Objective:  Pt sitting up in w/c. Transported to dayroom via w/c with max intermittent multimodal cues to attend to midline and left side of environment during transport secondary to significant right gaze noted.  Pt provided min intermittent cues during singing task with daughter instructed to sit on left side of patient to provide max cueing to pt to encourage attention to left visual field.  Pt noted attending to daughter and lyric sheet on left approximately 75% of activity with cues by dtr.  Pt requesting mid task to return to room due to fatigue.  Transported back to room, stand pivot completed to EOB with min assist and increased time. Doffed pants at sit to stand level with min assist and multimodal cues.  Sit to supine with min assist as well.  Provided measurement sheet to pts dtr and requested she measure home environment and return to therapist for more thorough dc planning.  Call bell in reach, seat alarm on.   ?  Assessment:  Pt making progress evidenced by increased attending to left visual field with less cues during meaningful activity.  Also exhibited improved  stand pivot transfer from mod assist to min assist today.  Primary barriers today included lethargy and fatigue. ?  Plan: Pt would benefit from further training on left visual scanning and attention. Pt also requesting to take shower level bathing next session. ? ?Therapy Documentation ?Precautions:  ?Precautions ?Precautions: Fall ?Precaution Comments: L inattention, PEG, left hemi, right head gaze ?Restrictions ?Weight Bearing Restrictions: No ? ? ? ?Therapy/Group: Individual Therapy ? ?[name] [name] ?04/14/2022, 12:47 PM ?

## 2022-04-14 NOTE — Progress Notes (Signed)
Physical Therapy Session Note ? ?Patient Details  ?Name: Sarah Myers ?MRN: [id number] ?Date of Birth: 05-10-1971 ? ?Today's Date: 04/14/2022 ?PT Individual Time: 3151-7616 ?PT Individual Time Calculation (min): 56 min  ? ?Short Term Goals: ?Week 2:  PT Short Term Goal 1 (Week 2): Pt will complete bed mobility with minA and LRAD ?PT Short Term Goal 2 (Week 2): Pt will complete bed<>chair transfers with minA and LRAD ?PT Short Term Goal 3 (Week 2): Pt will ambulate 11ft with maxA and LRAD ?PT Short Term Goal 4 (Week 2): Pt will initiate stair training ? ?Skilled Therapeutic Interventions/Progress Updates:  ?  Patient received sitting up in bed, agreeable to PT. She denies pain. Dtr at bedside. Patient remains with profound R gaze preference and limited ability to self-correct even with Max multimodal cuing.  ?Patient donning shirt with MaxA and verbal cues for orientation and sequencing, LB dressing MaxA as well. Patient presenting with 3+ flexor tone in L hemi body. MinA stand pivot transfer to the R. PT transporting patient to therapy gym for time management and energy conservation. Gait in // bars x5 ft forward, 39ft backward with MaxA + max multimodal cuing for posture and minimizing R posterior rotation. Patient positioned in // bars with screen on R side and mirror on L to encourage L gaze. Patient remains with questionable vision deficits as well. R LE stepping to target for improved L weight shift with multi modal cues to minimize posterior rotation and upright posture. Patient propelling herself in wc using R hemi technique and MinA. Patient remaining up in wc, seatbelt alarm on, call light within reach.  ? ?Therapy Documentation ?Precautions:  ?Precautions ?Precautions: Fall ?Precaution Comments: L inattention, PEG, left hemi, right head gaze ?Restrictions ?Weight Bearing Restrictions: No ? ? ? ?Therapy/Group: Individual Therapy ? ?Westley Foots ?Westley Foots, PT, DPT, CBIS ? ?04/14/2022, 7:41 AM  ?

## 2022-04-15 DIAGNOSIS — I610 Nontraumatic intracerebral hemorrhage in hemisphere, subcortical: Secondary | ICD-10-CM

## 2022-04-15 LAB — GLUCOSE, CAPILLARY
Glucose-Capillary: 104 mg/dL — ABNORMAL HIGH (ref 70–99)
Glucose-Capillary: 100 mg/dL — ABNORMAL HIGH (ref 70–99)
Glucose-Capillary: 120 mg/dL — ABNORMAL HIGH (ref 70–99)
Glucose-Capillary: 105 mg/dL — ABNORMAL HIGH (ref 70–99)

## 2022-04-15 MED ORDER — BACLOFEN 5 MG HALF TABLET
15.0000 mg | ORAL_TABLET | Freq: Every day | ORAL | Status: DC
  Administered 2022-04-15 – 2022-04-17 (×3): 15 mg via ORAL
  Filled 2022-04-15 (×3): qty 1

## 2022-04-15 NOTE — Progress Notes (Signed)
?                                                       PROGRESS NOTE ? ? ?Subjective/Complaints: ?Spasticity tightness left hamstring asks for pillow to be placed under calf ? ?ROS:  ? ?Pt denies SOB, abd pain, CP, N/V/C/D, and vision changes, + pain in hand intermittently, shoulder pain resolved, +left sided pain from spasticity ? ?Objective: ?  ?No results found. ?No results for input(s): WBC, HGB, HCT, PLT in the last 72 hours. ? ? ?Recent Labs  ?  04/13/22 ?1134  ?NA 138  ?K 3.8  ?CL 107  ?CO2 22  ?GLUCOSE 119*  ?BUN 10  ?CREATININE 0.92  ?CALCIUM 11.1*  ? ? ? ?No intake or output data in the 24 hours ending 04/15/22 7048 ? ?  ? ?  ? ?Physical Exam: ?Vital Signs ?Blood pressure (!) 148/82, pulse 67, temperature (!) 97.5 ?F (36.4 ?C), temperature source Oral, resp. rate 17, height 5\' 4"  (1.626 m), weight 69.2 kg, SpO2 97 %. ? ?General: No acute distress ?Mood and affect are appropriate ?Heart: Regular rate and rhythm no rubs murmurs or extra sounds ?Lungs: Clear to auscultation, breathing unlabored, no rales or wheezes ?Abdomen: Positive bowel sounds, soft nontender to palpation, nondistended ?Extremities: No clubbing, cyanosis, or edema ? ? ?Musculoskeletal: Patch on L shoulder ?   Cervical back: Neck supple. No tenderness.  ?   Comments: RUE- 4/5 or more- hard to test ?RLE 3+/5 proximally and 4-/5 distally ?LUE- 0/5 ?LLE- 2-/5 in HF and PF only ?  ?Skin: ?   General: Skin is warm and dry.  ?   Comments: Scab 1.5 cm on L forearm- otherwise skin looks OK  ?Neurological:  ?   Mental Status: She is alert and oriented to person, place, and time.  ?   Comments: Severe L inattention/neglect ?Intact to light touch in all 4 extremities ?Dysconjugate gaze, but denies double vision.  ?MAS of 2-3 in LUE_ shoulder MAS of 3; elbow 2-3 and MAS of 2 in L wrist ?MAS 3 L hamstring  ?Puffy L hand ?Squat pivot modA ? ?Psychiatric:     ?   Mood and Affect: Depressed mood ?   Comments: vague  ? ? ?Assessment/Plan: ?1. Functional  deficits which require 3+ hours per day of interdisciplinary therapy in a comprehensive inpatient rehab setting. ?Physiatrist is providing close team supervision and 24 hour management of active medical problems listed below. ?Physiatrist and rehab team continue to assess barriers to discharge/monitor patient progress toward functional and medical goals ? ?Care Tool: ? ?Bathing ?   ?Body parts bathed by patient: Front perineal area, Buttocks  ? Body parts bathed by helper: Buttocks, Front perineal area ?  ?  ?Bathing assist Assist Level: Maximal Assistance - Patient 24 - 49% ?  ?  ?Upper Body Dressing/Undressing ?Upper body dressing   ?What is the patient wearing?: Hospital gown only ?   ?Upper body assist Assist Level: Moderate Assistance - Patient 50 - 74% ?   ?Lower Body Dressing/Undressing ?Lower body dressing ? ? ?   ?What is the patient wearing?: Hospital gown only ? ?  ? ?Lower body assist Assist for lower body dressing: Maximal Assistance - Patient 25 - 49% ?   ? ?Toileting ?Toileting    ?Toileting assist Assist  for toileting: Total Assistance - Patient < 25% ?  ?  ?Transfers ?Chair/bed transfer ? ?Transfers assist ?   ? ?Chair/bed transfer assist level: Moderate Assistance - Patient 50 - 74% ?  ?  ?Locomotion ?Ambulation ? ? ?Ambulation assist ? ?   ? ?Assist level: Moderate Assistance - Patient 50 - 74% ?Assistive device: Parallel bars ?Max distance: 37ft  ? ?Walk 10 feet activity ? ? ?Assist ? Walk 10 feet activity did not occur: Safety/medical concerns ? ?  ?   ? ?Walk 50 feet activity ? ? ?Assist Walk 50 feet with 2 turns activity did not occur: Safety/medical concerns ? ?  ?   ? ? ?Walk 150 feet activity ? ? ?Assist Walk 150 feet activity did not occur: Safety/medical concerns ? ?  ?  ?  ? ?Walk 10 feet on uneven surface  ?activity ? ? ?Assist Walk 10 feet on uneven surfaces activity did not occur: Safety/medical concerns ? ? ?  ?   ? ?Wheelchair ? ? ? ? ?Assist Is the patient using a wheelchair?:  Yes ?Type of Wheelchair: Manual ?  ? ?Wheelchair assist level: Dependent - Patient 0% ?Max wheelchair distance: 125ft  ? ? ?Wheelchair 50 feet with 2 turns activity ? ? ? ?Assist ? ?  ?  ? ? ?Assist Level: Dependent - Patient 0%  ? ?Wheelchair 150 feet activity  ? ? ? ?Assist ?   ? ? ?Assist Level: Dependent - Patient 0%  ? ?Blood pressure (!) 148/82, pulse 67, temperature (!) 97.5 ?F (36.4 ?C), temperature source Oral, resp. rate 17, height 5\' 4"  (1.626 m), weight 69.2 kg, SpO2 97 %. ? ?Medical Problem List and Plan: ?1. 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            -patient may  shower- cover PEG ?            -ELOS/Goals: 14-16 days min A ? HFU scheduled ? Continue CIR- PT, OT and SLP ?2.  Antithrombotics: ?-DVT/anticoagulation:  Pharmaceutical: Other (comment)Eliquis ?            -antiplatelet therapy: none ?3. Shoulder Pain: continue Tylenol prn, Robaxin and Neurotin scheduled, Lidoderm patch.  ?4. Mood: LCSW to evaluate and provide emotional support ?            --continue sertraline 50 mg daily ?            -antipsychotic agents: n/a ?5. Neuropsych: This patient is capable of making decisions on her own behalf. ?6. Skin/Wound Care: Routine skin care checks ?7. Fluids/Electrolytes/Nutrition: Routine Is and Os and follow-up chemistries ?8: Left-sided spasticity: Primarily hamstring changed baclofen to 15mg  HS. Kpad ordered. Add 5mg  at 2pm.  ?9: Dysphagia: continue dys 2 thin liquid diet, provided education regarding nutritious D2 foods. 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 Increase magnesium gluconate to 500mg  HS. Continue Cozaar 25mg  daily. ?Vitals:  ? 04/14/22 1934 04/15/22 0415  ?BP: 127/79 (!) 148/82  ?Pulse: (!) 57 67  ?Resp: 17   ?Temp: 98.8 ?F (37.1 ?C) (!) 97.5 ?F (36.4 ?C)  ?SpO2: 99% 97%  ? ? ?12. Constipation: increase magnesium gluconate to 500mg  HS.  Provided with list of high fiber foods ?13. Hypokalemia: decrease klor to 20mg  daily. K+ 3.8, continue this dose. ? ?  Latest Ref Rng & Units 04/13/2022  ? 11:34 AM 04/10/2022  ?  5:41 AM 04/07/2022  ?  5:23  AM  ?BMP  ?Glucose 70 - 99 mg/dL 638   756   433    ?BUN 6 - 20 mg/dL 10   10   12     ?Creatinine 0.44 - 1.00 mg/dL   2.95   1.88    ?Sodium 135 - 145 mmol/L 138   137   139    ?Potassium 3.5 - 5.1 mmol/L 3.8   3.8   3.9    ?Chloride 98 - 111 mmol/L 107   108   111    ?CO2 22 - 32 mmol/L 22   21   21     ?Calcium 8.9 - 10.3 mg/dL 4.16     60.6    ? ? ?14.  CVA related fatigue, minimize sedating meds  ?15. Left sided inattention, poor visual scanning: continue to encourage turning to left.  ?16. Bradycardia: continue to monitor HR TID.  ?  ?  ? ?LOS: ?11 days ?A FACE TO FACE EVALUATION WAS PERFORMED ? ?30.1 Sarah Myers ?04/15/2022, 6:52 AM  ? ?  ?

## 2022-04-15 NOTE — Progress Notes (Signed)
Pt resting. Placed LUE splint and LLE boot. ?

## 2022-04-15 NOTE — Progress Notes (Signed)
Physical Therapy Session Note ? ?Patient Details  ?Name: Sarah Myers ?MRN: [id number] ?Date of Birth: 1971/09/23 ? ?Today's Date: 04/15/2022 ?PT Individual Time: 9518-8416 ?PT Individual Time Calculation (min): 56 min  ? ?Short Term Goals: ?Week 2:  PT Short Term Goal 1 (Week 2): Pt will complete bed mobility with minA and LRAD ?PT Short Term Goal 2 (Week 2): Pt will complete bed<>chair transfers with minA and LRAD ?PT Short Term Goal 3 (Week 2): Pt will ambulate 17ft with maxA and LRAD ?PT Short Term Goal 4 (Week 2): Pt will initiate stair training ? ?Skilled Therapeutic Interventions/Progress Updates:  ?Pt seated on BSC on arrival with NT and daughter present in room. Agreeable to therapy at this time and finished with BSC use.  ? ?TRANSFERS: ?Standing from BSC>Stedy lift assist with right UE pulling up, min guard assist for safety. Supervision to min guard assist for static standing with daughter performing peri care. Dependent transfer with lift from [facility] to EOB with min assist for controlled descent to bed surface. After eating, mod assist for standing at edge of bed to pull up pants. Then mod assist for stand pivot transfer bed>wheelchair with pt taking 2-3 lateral steps with right LE, assist needed to advance left LE. Sit<>stands performed x 4 reps in parallel bars and x 2 reps in hallway next to railing with min/mod assist, cues for hand placement, left foot placement and weight shifting. At end of session, mod assist for stand pivot transfer from w/c to recliner in room with pt taking steps with right LE, min assist needed with left LE advancement.  ? ?THERAPEUTIC ACTIVITY: ?Once seated at EOB pt's breakfast tray set up. Pt able to self feed with cues to scan entire tray to find items she asks for- the spoon, her juice and her yogurt. Once pt is done with breakfast, total assist to don pants on left LE with cues to attend to this task, pt then able to self don pants leg on right side. Once in standing  with min/mod assist for balance pt able to self pull up pants with cues to task on right side completely, assist to fully get left side up. Back in sitting pt needed assist to don socks and shoes on bil feet.  ? ?GAIT: ?Working on pre-gait tasks initially in parallel bars- standing with right UE support working on right LE stepping forward/backwards x 5 reps with left knee blocked, then in right stance left LE stepping forward/backwards x 5 reps with assistance needed. Progressed to walking 2 laps in bars with PTA assisting left LE advancement, pt self advancing right LE and daughter bringing chair behind patient. Cues/facilitation for upright posture, weight shifting, sequencing and to attend to left side. Progressing to in hallway outside of main gym: with right UE on rail, gait in hallway with mod/max assist for balance and left LE management. Attempted with PTA standing next to pt with max assist needed. Transitioned to PTA seated in front of pt with use of theraband assist for DF and hip/knee flexion on left LE with mod assist needed. Cues/facilitation as in parallel bars with max distance of 30 feet. Pt returned to room via wheelchair dependently for energy conservation after hallway gait. ? ?STRENGTHENING  ?Once seated in recliner with feet up working on left LE strengthening: quad sets with 5 sec holds x 10 reps, heel slides x 10 reps and straight leg raises x 10 reps.  ? ?Pt left in chair with feet up and  alarm belt on at end of session. All needs in reach with pillows under each arm. Pt noted to have decreased left attention as she fatigued. Pt did perform a "fist pump" after being informed she walked 30 feet in hallway.  ? ?Therapy Documentation ?Precautions:  ?Precautions ?Precautions: Fall ?Precaution Comments: L inattention, PEG, left hemi, right head gaze ?Restrictions ?Weight Bearing Restrictions: No ? ? ? ? ?Therapy/Group: Individual Therapy ? ?Sallyanne Kuster, PTA, CLT ?[facility] ?04/15/22,  12:46 PM   ?

## 2022-04-15 NOTE — Progress Notes (Signed)
Occupational Therapy Session Note ? ?Patient Details  ?Name: Sarah Myers ?MRN: [id number] ?Date of Birth: 23-May-1971 ? ?Today's Date: 04/15/2022 ?OT Individual Time: 4037-5436 ?OT Individual Time Calculation (min): 60 min  ? ? ?Short Term Goals: ?Week 1:  OT Short Term Goal 1 (Week 1): Pt will complete UB dressing with min assist ?OT Short Term Goal 1 - Progress (Week 1): Met ?OT Short Term Goal 2 (Week 1): Pt will complete squat pivot BSC transfer with mod assist ?OT Short Term Goal 2 - Progress (Week 1): Met ?OT Short Term Goal 3 (Week 1): Pt will complete LB dressing with mod assist ?OT Short Term Goal 3 - Progress (Week 1): Met ?OT Short Term Goal 4 (Week 1): Pt will complete oral hygiene with setup and min VCs for visual scanning and item retrieval. ?OT Short Term Goal 4 - Progress (Week 1): Met ?Week 2:  OT Short Term Goal 1 (Week 2): Pt will complete stand pivot to [facility] with min assist. ?OT Short Term Goal 2 (Week 2): Pt will complete LB dressing with min assist. ?OT Short Term Goal 3 (Week 2): Pt will scan and retrieve ADL items in all fields of vision with min multimodal cues. ?OT Short Term Goal 4 (Week 2): Pt will complete toileting with min assist. ? ?Patient met lying supine in bed in agreement with OT treatment session with focus on self-care re-education and LUE NMR. Denies pain at rest and with activity. Supine to EOB on L with HOB elevated, grab bar and Min A at LLE. Able to scoot anteriorly toward EOB with Min A on L and frequent multimodal cues for technique. Sit to stand at EOB with Min A. Squat-pivot to wc on R with Min A after positioning of LLE. Cues for hand placement and technique. Total A for wc transport to bathroom in prep for bathing at shower level. Walk-in shower transfer to and from tub bench via squat-pivot and [place] with cueing as indicated above. UB bathing/dressing with Min-Mod A and LB bathing/dressing with Mod A overall. Avoided direct water pressure to PEG. Able to wash/dry  BLE in figure-4 position and don footwear with hemi technique. Maintains static sitting balance with supervision A. Intermittent Min A to maintain dynamic sitting balance. Attempted stand-pivot to drop-arm recliner but patient attempted to sit too soon. Total A for safe descent to recliner. Pillows positioned below BLE and under LUE. Towel roll placed in L hand to maintain skin integrity. Call bell within reach, belt alarm activated and all needs met.  ? ?Therapy Documentation ?Precautions:  ?Precautions ?Precautions: Fall ?Precaution Comments: L inattention, PEG, left hemi, right head gaze ?Restrictions ?Weight Bearing Restrictions: No ?General: ?  ?Therapy/Group: Individual Therapy ? ?[name] ?04/15/2022, 10:42 AM ?

## 2022-04-16 DIAGNOSIS — I610 Nontraumatic intracerebral hemorrhage in hemisphere, subcortical: Secondary | ICD-10-CM | POA: Diagnosis not present

## 2022-04-16 LAB — GLUCOSE, CAPILLARY
Glucose-Capillary: 90 mg/dL (ref 70–99)
Glucose-Capillary: 144 mg/dL — ABNORMAL HIGH (ref 70–99)
Glucose-Capillary: 87 mg/dL (ref 70–99)
Glucose-Capillary: 84 mg/dL (ref 70–99)

## 2022-04-16 NOTE — Progress Notes (Signed)
?                                                       PROGRESS NOTE ? ? ?Subjective/Complaints: ?Slept well no spasms last noc  ? ?ROS:  ? ?Pt denies SOB, abd pain, CP, N/V/C/D, and vision changes, + pain in hand intermittently, shoulder pain resolved, +left sided pain from spasticity ? ?Objective: ?  ?No results found. ?No results for input(s): WBC, HGB, HCT, PLT in the last 72 hours. ? ? ?Recent Labs  ?  04/13/22 ?1134  ?NA 138  ?K 3.8  ?CL 107  ?CO2 22  ?GLUCOSE 119*  ?BUN 10  ?CREATININE 0.92  ?CALCIUM 11.1*  ? ? ? ? ?Intake/Output Summary (Last 24 hours) at 04/16/2022 0739 ?Last data filed at 04/16/2022 0030 ?Gross per 24 hour  ?Intake 420 ml  ?Output --  ?Net 420 ml  ? ? ?  ? ?  ? ?Physical Exam: ?Vital Signs ?Blood pressure 132/74, pulse (!) 58, temperature 97.9 ?F (36.6 ?C), temperature source Oral, resp. rate 18, height 5\' 4"  (1.626 m), weight 69.2 kg, SpO2 98 %. ? ?General: No acute distress ?Mood and affect are appropriate ?Heart: Regular rate and rhythm no rubs murmurs or extra sounds ?Lungs: Clear to auscultation, breathing unlabored, no rales or wheezes ?Abdomen: Positive bowel sounds, soft nontender to palpation, nondistended ?Extremities: No clubbing, cyanosis, or edema ? ? ?Musculoskeletal: Patch on L shoulder ?   Cervical back: Neck supple. No tenderness.  ?   Comments: RUE- 4/5 or more- hard to test ?RLE 3+/5 proximally and 4-/5 distally ?LUE- 0/5 ?LLE- 2-/5 in HF and PF only ?  ?Skin: ?   General: Skin is warm and dry.  ?   Comments: Scab 1.5 cm on L forearm- otherwise skin looks OK  ?Neurological:  ?   Mental Status: She is alert and oriented to person, place, and time.  ?   Comments: Severe L inattention/neglect ?Intact to light touch in all 4 extremities ?Dysconjugate gaze, but denies double vision.  ?MAS of 2-3 in LUE_ shoulder MAS of 3; elbow 2-3 and MAS of 2 in L wrist ?MAS 3 L hamstring  ?Puffy L hand ?Squat pivot modA ? ?Psychiatric:     ?   Mood and Affect: Depressed mood ?   Comments:  vague  ? ? ?Assessment/Plan: ?1. Functional deficits which require 3+ hours per day of interdisciplinary therapy in a comprehensive inpatient rehab setting. ?Physiatrist is providing close team supervision and 24 hour management of active medical problems listed below. ?Physiatrist and rehab team continue to assess barriers to discharge/monitor patient progress toward functional and medical goals ? ?Care Tool: ? ?Bathing ?   ?Body parts bathed by patient: Front perineal area, Buttocks  ? Body parts bathed by helper: Buttocks, Front perineal area ?  ?  ?Bathing assist Assist Level: Maximal Assistance - Patient 24 - 49% ?  ?  ?Upper Body Dressing/Undressing ?Upper body dressing   ?What is the patient wearing?: [place] only ?   ?Upper body assist Assist Level: Moderate Assistance - Patient 50 - 74% ?   ?Lower Body Dressing/Undressing ?Lower body dressing ? ? ?   ?What is the patient wearing?: [place] only ? ?  ? ?Lower body assist Assist for lower body dressing: Maximal Assistance - Patient 25 -  49% ?   ? ?Toileting ?Toileting    ?Toileting assist Assist for toileting: Total Assistance - Patient < 25% ?  ?  ?Transfers ?Chair/bed transfer ? ?Transfers assist ?   ? ?Chair/bed transfer assist level: Moderate Assistance - Patient 50 - 74% ?  ?  ?Locomotion ?Ambulation ? ? ?Ambulation assist ? ?   ? ?Assist level: Moderate Assistance - Patient 50 - 74% ?Assistive device: Other (comment) (hall rail) ?Max distance: 30  ? ?Walk 10 feet activity ? ? ?Assist ? Walk 10 feet activity did not occur: Safety/medical concerns ? ?Assist level: Moderate Assistance - Patient - 50 - 74% ?Assistive device: Other (comment) (hall rail)  ? ?Walk 50 feet activity ? ? ?Assist Walk 50 feet with 2 turns activity did not occur: Safety/medical concerns ? ?  ?   ? ? ?Walk 150 feet activity ? ? ?Assist Walk 150 feet activity did not occur: Safety/medical concerns ? ?  ?  ?  ? ?Walk 10 feet on uneven surface  ?activity ? ? ?Assist Walk 10  feet on uneven surfaces activity did not occur: Safety/medical concerns ? ? ?  ?   ? ?Wheelchair ? ? ? ? ?Assist Is the patient using a wheelchair?: Yes ?Type of Wheelchair: Manual ?  ? ?Wheelchair assist level: Dependent - Patient 0% ?Max wheelchair distance: 19ft  ? ? ?Wheelchair 50 feet with 2 turns activity ? ? ? ?Assist ? ?  ?  ? ? ?Assist Level: Dependent - Patient 0%  ? ?Wheelchair 150 feet activity  ? ? ? ?Assist ?   ? ? ?Assist Level: Dependent - Patient 0%  ? ?Blood pressure 132/74, pulse (!) 58, temperature 97.9 ?F (36.6 ?C), temperature source Oral, resp. rate 18, height 5\' 4"  (1.626 m), weight 69.2 kg, SpO2 98 %. ? ?Medical Problem List and Plan: ?1. 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            -patient may  shower- cover PEG ?            -ELOS/Goals: 14-16 days min A ? HFU scheduled ? Continue CIR- PT, OT and SLP ?2.  Antithrombotics: ?-DVT/anticoagulation:  Pharmaceutical: Other (comment)Eliquis ?            -antiplatelet therapy: none ?3. Shoulder Pain: continue Tylenol prn, Robaxin and Neurotin scheduled, Lidoderm patch.  ?4. Mood: LCSW to evaluate and provide emotional support ?            --continue sertraline 50 mg daily ?            -antipsychotic agents: n/a ?5. Neuropsych: This patient is capable of making decisions on her own behalf. ?6. Skin/Wound Care: Routine skin care checks ?7. Fluids/Electrolytes/Nutrition: Routine Is and Os and follow-up chemistries ?8: Left-sided spasticity: Primarily hamstring changed baclofen to 15mg  HS. Kpad ordered. Add 5mg  at 2pm.  ?9: Dysphagia: continue dys 2 thin liquid diet, provided education regarding nutritious D2 foods. 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 Increase magnesium gluconate to 500mg  HS. Continue Cozaar 25mg  daily. ?Vitals:  ? 04/15/22 1931 04/16/22 0255  ?BP: 129/74 132/74  ?Pulse: (!) 56 (!) 58   ?Resp: 18 18  ?Temp: 97.8 ?F (36.6 ?C) 97.9 ?F (36.6 ?C)  ?SpO2: 99% 98%  ? ? ?12. Constipation: increase magnesium gluconate to 500mg  HS. Provided with list of high fiber foods ?13. Hypokalemia: decrease klor to 20mg  daily. K+ 3.8, continue this  dose. ? ?  Latest Ref Rng & Units 04/13/2022  ? 11:34 AM 04/10/2022  ?  5:41 AM 04/07/2022  ?  5:23 AM  ?BMP  ?Glucose 70 - 99 mg/dL 119   110   103    ?BUN 6 - 20 mg/dL 10   10   12     ?Creatinine 0.44 - 1.00 mg/dL 0.92   0.94   0.82    ?Sodium 135 - 145 mmol/L 138   137   139    ?Potassium 3.5 - 5.1 mmol/L 3.8   3.8   3.9    ?Chloride 98 - 111 mmol/L 107   108   111    ?CO2 22 - 32 mmol/L 22   21   21     ?Calcium 8.9 - 10.3 mg/dL 11.1   10.6   10.3    ? ? ?14.  CVA related fatigue, minimize sedating meds  ?15. Left sided inattention, poor visual scanning: continue to encourage turning to left.  ?16. Bradycardia: continue to monitor HR TID.  ?  ?  ? ?LOS: ?12 days ?A FACE TO FACE EVALUATION WAS PERFORMED ? ?Luanna Salk Tuyen Uncapher ?04/16/2022, 7:39 AM  ? ?  ?

## 2022-04-17 DIAGNOSIS — I619 Nontraumatic intracerebral hemorrhage, unspecified: Secondary | ICD-10-CM | POA: Diagnosis not present

## 2022-04-17 LAB — CBC
HCT: 37.1 % (ref 36.0–46.0)
Hemoglobin: 12.4 g/dL (ref 12.0–15.0)
MCH: 29 pg (ref 26.0–34.0)
MCHC: 33.4 g/dL (ref 30.0–36.0)
MCV: 86.9 fL (ref 80.0–100.0)
Platelets: 147 10*3/uL — ABNORMAL LOW (ref 150–400)
RBC: 4.27 MIL/uL (ref 3.87–5.11)
RDW: 13.5 % (ref 11.5–15.5)
WBC: 4 10*3/uL (ref 4.0–10.5)
nRBC: 0 % (ref 0.0–0.2)

## 2022-04-17 LAB — BASIC METABOLIC PANEL
Anion gap: 7 (ref 5–15)
BUN: 7 mg/dL (ref 6–20)
CO2: 21 mmol/L — ABNORMAL LOW (ref 22–32)
Calcium: 10.8 mg/dL — ABNORMAL HIGH (ref 8.9–10.3)
Chloride: 109 mmol/L (ref 98–111)
Creatinine, Ser: 0.83 mg/dL (ref 0.44–1.00)
GFR, Estimated: >60 mL/min (ref >60)
Glucose, Bld: 94 mg/dL (ref 70–99)
Potassium: 3.5 mmol/L (ref 3.5–5.1)
Sodium: 137 mmol/L (ref 135–145)

## 2022-04-17 LAB — GLUCOSE, CAPILLARY
Glucose-Capillary: 107 mg/dL — ABNORMAL HIGH (ref 70–99)
Glucose-Capillary: 107 mg/dL — ABNORMAL HIGH (ref 70–99)
Glucose-Capillary: 99 mg/dL (ref 70–99)
Glucose-Capillary: 118 mg/dL — ABNORMAL HIGH (ref 70–99)

## 2022-04-17 MED ORDER — AMANTADINE HCL 50 MG/5ML PO SOLN
100.0000 mg | Freq: Two times a day (BID) | ORAL | Status: DC
  Administered 2022-04-18 – 2022-04-25 (×16): 100 mg via ORAL
  Filled 2022-04-17 (×18): qty 10

## 2022-04-17 NOTE — Plan of Care (Signed)
Pt's plan of care adjusted to 15/7 after speaking with care team and discussed with MD in team conference as pt currently unable to tolerate current therapy schedule with OT, PT, and SLP.   

## 2022-04-17 NOTE — Progress Notes (Signed)
?                                                       PROGRESS NOTE ? ? ?Subjective/Complaints: ?No new complaints this morning ?Somnolent ?Patient's chart reviewed- No issues reported overnight ?Vitals signs stable  ? ?ROS:  ? ?Pt denies SOB, abd pain, CP, N/V/C/D, and vision changes, + pain in hand intermittently, shoulder pain resolved, +left sided pain from spasticity ? ?Objective: ?  ?No results found. ?Recent Labs  ?  04/16/22 ?2347  ?WBC 4.0  ?HGB 12.4  ?HCT 37.1  ?PLT 147*  ? ? ? ?Recent Labs  ?  04/16/22 ?2347  ?NA 137  ?K 3.5  ?CL 109  ?CO2 21*  ?GLUCOSE 94  ?BUN 7  ?CREATININE 0.83  ?CALCIUM 10.8*  ? ? ? ?Intake/Output Summary (Last 24 hours) at 04/17/2022 0932 ?Last data filed at 04/17/2022 0700 ?Gross per 24 hour  ?Intake 348 ml  ?Output 250 ml  ?Net 98 ml  ? ?  ? ?  ? ?Physical Exam: ?Vital Signs ?Blood pressure 139/80, pulse 61, temperature 97.7 ?F (36.5 ?C), temperature source Oral, resp. rate 18, height 5\' 4"  (1.626 m), weight 69.2 kg, SpO2 98 %. ? ?General: No acute distress, BMI 26.19 ?Mood and affect are appropriate ?Heart: Regular rate and rhythm no rubs murmurs or extra sounds ?Lungs: Clear to auscultation, breathing unlabored, no rales or wheezes ?Abdomen: Positive bowel sounds, soft nontender to palpation, nondistended ?Extremities: No clubbing, cyanosis, or edema ? ? ?Musculoskeletal: Patch on L shoulder ?   Cervical back: Neck supple. No tenderness.  ?   Comments: RUE- 4/5 or more- hard to test ?RLE 3+/5 proximally and 4-/5 distally ?LUE- 0/5 ?LLE- 2-/5 in HF and PF only ?  ?Skin: ?   General: Skin is warm and dry.  ?   Comments: Scab 1.5 cm on L forearm- otherwise skin looks OK  ?Neurological:  ?   Mental Status: She is alert and oriented to person, place, and time.  ?   Comments: Severe L inattention/neglect ?Intact to light touch in all 4 extremities ?Dysconjugate gaze, but denies double vision.  ?MAS of 2-3 in LUE_ shoulder MAS of 3; elbow 2-3 and MAS of 2 in L wrist ?MAS 3 L hamstring   ?Puffy L hand ?Squat pivot modA ? ?Psychiatric:     ?   Mood and Affect: Depressed mood ?   Comments: vague  ? ? ?Assessment/Plan: ?1. Functional deficits which require 3+ hours per day of interdisciplinary therapy in a comprehensive inpatient rehab setting. ?Physiatrist is providing close team supervision and 24 hour management of active medical problems listed below. ?Physiatrist and rehab team continue to assess barriers to discharge/monitor patient progress toward functional and medical goals ? ?Care Tool: ? ?Bathing ?   ?Body parts bathed by patient: Front perineal area, Buttocks  ? Body parts bathed by helper: Buttocks, Front perineal area ?  ?  ?Bathing assist Assist Level: Maximal Assistance - Patient 24 - 49% ?  ?  ?Upper Body Dressing/Undressing ?Upper body dressing   ?What is the patient wearing?: Hospital gown only ?   ?Upper body assist Assist Level: Moderate Assistance - Patient 50 - 74% ?   ?Lower Body Dressing/Undressing ?Lower body dressing ? ? ?   ?What is the patient wearing?: Hospital gown  only ? ?  ? ?Lower body assist Assist for lower body dressing: Maximal Assistance - Patient 25 - 49% ?   ? ?Toileting ?Toileting    ?Toileting assist Assist for toileting: Total Assistance - Patient < 25% ?  ?  ?Transfers ?Chair/bed transfer ? ?Transfers assist ?   ? ?Chair/bed transfer assist level: Moderate Assistance - Patient 50 - 74% ?  ?  ?Locomotion ?Ambulation ? ? ?Ambulation assist ? ?   ? ?Assist level: Moderate Assistance - Patient 50 - 74% ?Assistive device: Other (comment) (hall rail) ?Max distance: 30  ? ?Walk 10 feet activity ? ? ?Assist ? Walk 10 feet activity did not occur: Safety/medical concerns ? ?Assist level: Moderate Assistance - Patient - 50 - 74% ?Assistive device: Other (comment) (hall rail)  ? ?Walk 50 feet activity ? ? ?Assist Walk 50 feet with 2 turns activity did not occur: Safety/medical concerns ? ?  ?   ? ? ?Walk 150 feet activity ? ? ?Assist Walk 150 feet activity did not  occur: Safety/medical concerns ? ?  ?  ?  ? ?Walk 10 feet on uneven surface  ?activity ? ? ?Assist Walk 10 feet on uneven surfaces activity did not occur: Safety/medical concerns ? ? ?  ?   ? ?Wheelchair ? ? ? ? ?Assist Is the patient using a wheelchair?: Yes ?Type of Wheelchair: Manual ?  ? ?Wheelchair assist level: Dependent - Patient 0% ?Max wheelchair distance: 126ft  ? ? ?Wheelchair 50 feet with 2 turns activity ? ? ? ?Assist ? ?  ?  ? ? ?Assist Level: Dependent - Patient 0%  ? ?Wheelchair 150 feet activity  ? ? ? ?Assist ?   ? ? ?Assist Level: Dependent - Patient 0%  ? ?Blood pressure 139/80, pulse 61, temperature 97.7 ?F (36.5 ?C), temperature source Oral, resp. rate 18, height 5\' 4"  (1.626 m), weight 69.2 kg, SpO2 98 %. ? ?Medical Problem List and Plan: ?1. 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            -patient may  shower- cover PEG ?            -ELOS/Goals: 14-16 days min A ? HFU scheduled ? Continue CIR- PT, OT and SLP ?2.  Antithrombotics: ?-DVT/anticoagulation:  Pharmaceutical: Other (comment)Eliquis ?            -antiplatelet therapy: none ?3. Shoulder Pain: continue Tylenol prn, Robaxin and Neurotin scheduled, Lidoderm patch.  ?4. Mood: LCSW to evaluate and provide emotional support ?            --continue sertraline 50 mg daily ?            -antipsychotic agents: n/a ?5. Neuropsych: This patient is capable of making decisions on her own behalf. ?6. Skin/Wound Care: Routine skin care checks ?7. Fluids/Electrolytes/Nutrition: Routine Is and Os and follow-up chemistries ?8: Left-sided spasticity: Primarily hamstring changed baclofen to 15mg  HS. Kpad ordered. Add 5mg  at 2pm.  ?9: Dysphagia: continue dys 2 thin liquid diet, provided education regarding nutritious D2 foods. 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 Increase magnesium gluconate to 500mg  HS. Continue  Cozaar 25mg  daily. ?Vitals:  ? 04/17/22 0339 04/17/22 0548  ?BP: 137/78 139/80  ?Pulse: 61   ?Resp: 18   ?Temp: 97.7 ?F (36.5 ?C)   ?SpO2: 98%   ? ? ?12. Constipation: increase magnesium gluconate to 500mg  HS. Provided with list of high  fiber foods ?13. Hypokalemia: decrease klor to 20mg  daily. K+ 3.8, continue this dose. ? ?  Latest Ref Rng & Units 04/16/2022  ? 11:47 PM 04/13/2022  ? 11:34 AM 04/10/2022  ?  5:41 AM  ?BMP  ?Glucose 70 - 99 mg/dL 94   04/12/2022   850    ?BUN 6 - 20 mg/dL 7   10   10     ?Creatinine 0.44 - 1.00 mg/dL 277     4.12    ?Sodium 135 - 145 mmol/L 137   138   137    ?Potassium 3.5 - 5.1 mmol/L 3.5   3.8   3.8    ?Chloride 98 - 111 mmol/L 109   107   108    ?CO2 22 - 32 mmol/L 21   22   21     ?Calcium 8.9 - 10.3 mg/dL 8.78   6.76      ? ? ?14.  CVA related fatigue, minimize sedating meds  ?15. Left sided inattention, poor visual scanning: continue to encourage turning to left.  ?16. Bradycardia: resolved, continue to monitor HR TID.  ?  ?  ? ?LOS: ?13 days ?A FACE TO FACE EVALUATION WAS PERFORMED ? ?72.0 P Sarah Myers ?04/17/2022, 9:32 AM  ? ?  ?

## 2022-04-17 NOTE — Progress Notes (Signed)
Speech Language Pathology Daily Session Note ? ?Patient Details  ?Name: Sarah Myers ?MRN: [id number] ?Date of Birth: 21-Jun-1971 ? ?Today's Date: 04/17/2022 ?SLP Individual Time: 3716-9678 ?SLP Individual Time Calculation (min): 58 min ? ?Short Term Goals: ?Week 2: SLP Short Term Goal 1 (Week 2): Patient will demonstrate efficient mastication and complete oral clearance with trials of Dys. 3 textures without overt s/s of aspiration with supervision level verbal cues over 2 sessions prior to upgrade. ?SLP Short Term Goal 2 (Week 2): Patient will demonstrate sustained attention to functional tasks for 10 minutes with Mod verbal cues for redirection. ?SLP Short Term Goal 3 (Week 2): Patient will demonstrate functional problem solving for basic and familiar tasks with mod verbal cues. ?SLP Short Term Goal 4 (Week 2): Patient will attend/scan to midline during functional tasks with Min verbal cues. ? ?Skilled Therapeutic Interventions:Skilled ST services focused on swallow skills. Pt requested to transfer from chair to bed, Pt required min A with stedy. SLP facilitated PO intake of dys 3 textures and thin liquids via straw trial lunch tray. Pt expressed great pleasure over cheeseburger. Pt demonstrated ability to self-fed with set up assist and min A verbal cues navigating tray. Pt demonstrated prolonged mastication/bolus preparation, pt supports due to caution and desire to masticate solids to avoid aspirating. Pt demonstrated ability to clear oral cavity and SLP provided education to utilize liquid wash, alternating liquids and solids to aid in bolus cohesion. Pt only consumed 1/4th of burger, pt supports due to appetite verse fatigue form advanced textures. SLP recommends dys 3 textures and thin liquids with full supervision and tolerance with advance textures monitoring fatigue/intake. SLP called pt's daughter per request. Pt was left in room with call bell within reach and bed alarm set. SLP recommends to continue  skilled services. ?   ? ?Pain ?Pain Assessment ?Pain Score: 0-No pain ? ?Therapy/Group: Individual Therapy ? ?[name] ?04/17/2022, 1:18 PM ?

## 2022-04-17 NOTE — Progress Notes (Signed)
Physical Therapy Session Note ? ?Patient Details  ?Name: Sarah Myers ?MRN: [id number] ?Date of Birth: 12-15-1971 ? ?Today's Date: 04/17/2022 ?PT Individual Time: 4098-1191 + 4782-9562 ?PT Individual Time Calculation (min): 40 min  and 24 min ?Today's Date: 04/17/2022 ?PT Missed Time: 35 Minutes ?Missed Time Reason: Patient unwilling to participate;Patient fatigue ? ?Short Term Goals: ?Week 2:  PT Short Term Goal 1 (Week 2): Pt will complete bed mobility with minA and LRAD ?PT Short Term Goal 2 (Week 2): Pt will complete bed<>chair transfers with minA and LRAD ?PT Short Term Goal 3 (Week 2): Pt will ambulate 71f with maxA and LRAD ?PT Short Term Goal 4 (Week 2): Pt will initiate stair training ? ?Skilled Therapeutic Interventions/Progress Updates:  ?   ?1st session: ?Pt greeted supine in bed to start session - strong R gaze preference remains, as well as flat affect and apathy towards therapy. Pt agreeable to PT tx to start, denies pain. Pt undressed in bed and offered assistance in getting dressed to prepare for leaving room to rehab gym. Pt reporting that she can't wear any of her clothes as they're "too tight" around her neck. Retrieved her loose fitting pajama pants and a wide fitting v-neck t-shirt. Pt agreeable to try them on but after trying, she refused and wouldn't wear them because they were too tight. Offered her a hospital gown but she wanted to wear it on her back side only. Explained that she needed to be covered for therapy but she refused to wear a 2nd gown over her front side because she felt it would be too tight around her neck. Unable to reason with patient for dressing - poor insight into her deficits. She reports her daughter is bringing in clothing from home that she will wear but doesn't know when her daughter is arriving. Remainder of session spent on LLE strengthening and stretching. Supine there-ex including heel slides, hip abduction, quad sets, and leg press - all requiring AAROM due to  weakness and inattention - 1x12. Stretching of the hip musculature and heel cord due to tone. Pt reports she's been wearing her WHO and adjustable night splint at night but suspect this may not be true. Sign above bed. She missed 35 minutes due to refusal to get dressed in order to actively participate in therapy.  ? ?2nd session: ?Pt resting in bed at start of session. NT also at bedside assessing routine vitals - WNL. Pt agreeable to PT tx and denies pain. Pt fully dressed (in night gown). Once prompted to begin mobilizing to EOB, pt grabbing sheets and call bell and stating "i'm not getting out of bed." NT also at bedside who was providing encouragement and motivation and it took a while for the pt to eventually be agreeable. With HOB elevated, pt requiring modA for sit>supine for trunk and BLE management. Sit's EOB with SBA. Squat<>pivot transfer from EOB to w/c with min/modA towards her weaker L side. Once in w/c, pt required convincing to continue therapy session in rehab gym. Transported in w/c and setup in // bars. Once in bars, pt reporting she was too cold and would not do therapy in a cold gym. Offered her another blanket (she already had one). Pt unwilling to let go of blankets in order to work in // bars - deferred for safety concerns. Returned patient to her room and assisted back to bed via modA squat<>pivot transfer. Required modA for sit>supine for BLE and trunk support, as well as for repositioning  du eto poor awareness. HOB elevated per her request, bed alarm on, all needs met.  ? ?Therapy Documentation ?Precautions:  ?Precautions ?Precautions: Fall ?Precaution Comments: L inattention, PEG, left hemi, right head gaze ?Restrictions ?Weight Bearing Restrictions: No ?General: ?  ? ?Therapy/Group: Individual Therapy ? ?Prince Couey P Mansi Tokar ?04/17/2022, 7:15 AM  ?

## 2022-04-17 NOTE — Progress Notes (Addendum)
Occupational Therapy Session Note ? ?Patient Details  ?Name: Sarah Myers ?MRN: [id number] ?Date of Birth: January 16, 1971 ? ?Today's Date: 04/17/2022 ?OT Individual Time: 1045-1200 ?OT Individual Time Calculation (min): 75 min  ? ? ?Short Term Goals: ?Week 2:  OT Short Term Goal 1 (Week 2): Pt will complete stand pivot to [facility] with min assist. ?OT Short Term Goal 2 (Week 2): Pt will complete LB dressing with min assist. ?OT Short Term Goal 3 (Week 2): Pt will scan and retrieve ADL items in all fields of vision with min multimodal cues. ?OT Short Term Goal 4 (Week 2): Pt will complete toileting with min assist. ? ? ?Skilled Therapeutic Interventions/Progress Updates:  ?Pt perseverating on topic of a different therapist coming to treat her this morning before anyone on her schedule.  After encouragement, pt agreeable to completing self care at sink.  ? Pt semi reclined in bed. Supine to sit with hob elevated with min assist.  Stand pivot with mod assist and cues to attend to right side.  Transported to sink and ADL items setup.  Pt required max Vcs step by step to facilitate sequencing and visual scanning to brush teeth, and bathe UB/LB. Pt bathed UB with min assist using LHS. Pt required mod assist sit<>stand and dynamic standing balance during bathing of buttocks and peri region.  Pt donned shirt with min assist and max Vcs.  Donned brief with max assist and refused pants due to feeling too hot.  Provided pt with kinesiotape to left hand for edema reduction purposes.  Left arm trough with folded towels to cushion provided to support and position LUE while in w/c.  Call bell in reach, seat alarm on.   ? ?After therapy session, per daughters request, spoke on phone with PT present as well and educated daughter regarding pts current functional status and barriers to progress.  Daughter reports she plans to attend therapies in next couple of days to encourage pt to participate more. ? ? ?Therapy Documentation ?Precautions:   ?Precautions ?Precautions: Fall ?Precaution Comments: L inattention, PEG, left hemi, right head gaze ?Restrictions ?Weight Bearing Restrictions: No ? ? ? ?Therapy/Group: Individual Therapy ? ?[name] Adylene Dlugosz ?04/17/2022, 3:09 PM ?

## 2022-04-17 NOTE — Progress Notes (Signed)
Patient ID: Sarah Myers, female   DOB: 07-04-1971, 51 y.o.   MRN: [id number]  Spoke with Sarah Myers-pt's eldest daughter to discuss mom's lack of motivation to participate in therapies and concern regarding flight of stairs to get into her apartment. Daughter wanted to talk with a therapist and have given Sarah Myers and Sarah Myers her number to call her. Aware of need to do family education and wanted to see if here pt would participate more in her therapies. Will continue to work on discharge needs. Have messaged therapy team regarding daughter wanting to speak with them ?

## 2022-04-18 DIAGNOSIS — I619 Nontraumatic intracerebral hemorrhage, unspecified: Secondary | ICD-10-CM | POA: Diagnosis not present

## 2022-04-18 LAB — GLUCOSE, CAPILLARY
Glucose-Capillary: 113 mg/dL — ABNORMAL HIGH (ref 70–99)
Glucose-Capillary: 114 mg/dL — ABNORMAL HIGH (ref 70–99)
Glucose-Capillary: 94 mg/dL (ref 70–99)
Glucose-Capillary: 106 mg/dL — ABNORMAL HIGH (ref 70–99)

## 2022-04-18 MED ORDER — BACLOFEN 10 MG PO TABS
10.0000 mg | ORAL_TABLET | Freq: Every day | ORAL | Status: DC
  Administered 2022-04-18 – 2022-04-24 (×7): 10 mg via ORAL
  Filled 2022-04-18 (×7): qty 1

## 2022-04-18 NOTE — Progress Notes (Addendum)
Speech Language Pathology Daily Session Note ? ?Patient Details  ?Name: Sarah Myers ?MRN: [id number] ?Date of Birth: 1971-04-21 ? ?Today's Date: 04/18/2022 ?SLP Individual Time: 0930-1030 ?SLP Individual Time Calculation (min): 60 min ? ?Short Term Goals: ?Week 2: SLP Short Term Goal 1 (Week 2): Patient will demonstrate efficient mastication and complete oral clearance with trials of Dys. 3 textures without overt s/s of aspiration with supervision level verbal cues over 2 sessions prior to upgrade. ?SLP Short Term Goal 2 (Week 2): Patient will demonstrate sustained attention to functional tasks for 10 minutes with Mod verbal cues for redirection. ?SLP Short Term Goal 3 (Week 2): Patient will demonstrate functional problem solving for basic and familiar tasks with mod verbal cues. ?SLP Short Term Goal 4 (Week 2): Patient will attend/scan to midline during functional tasks with Min verbal cues. ? ?Skilled Therapeutic Interventions: ?Pt seen this date for skilled ST intervention targeting dysphagia and cognitive-linguistic goals outlined above. Pt encountered lethargic; lying in bed. Flat affect and apathy persists. Agreeable to transfer from bed to recliner chair with encouragement; Min A + 1 with Stedy; Mod A for command following secondary to deficits in motor initiation + planning, sequencing, and safety awareness. Agreeable to ST intervention in room. ? ?SLP facilitated today's session by providing the following skilled ST intervention targeting functional tx tasks: Mod-Max to Max verbal and visual prompts, HOH assistance, mass practice, corrective feedback, Max A for feeding due to poor initiation, attention, and visual deficits, and reinforcement for implementation of compensatory strategies for attention to L visual field. ? ?Pt placed numbers in May calendar with Total A (HOH assistance) for L visual attention, and writing. Pt continues to require Total A for reading and intermittent placement of object in  her hand for naming and identification. Attended to midline and slightly L of midline during functional tasks (calendar and self-feeding) given Max verbal and visual A faded to Min verbal A with mass practice and reinforcement. Pt oriented x 3 with Mod I; oriented to date with Max verbal A; unable to utilize external aids due to visual deficits. On probe, pt denied any changes in her mobility, deglutition, cognition, and ADL skills s/p CVA; therefore, SLP provided thorough education re: CVA and current deficits. With overall Mod to Max verbal and visual A and extended processing time, pt verbalized 4 out of 6 new physical and cognitive deficits s/p CVA. Provided rationale for why this was a new challenge given Min to Mod verbal A. Mod-Max A for sustained attention to task. Will require reinforcement of goals and deficits given limited carryover and recall.  ? ?Re: deglutition, pt consumed whole banana with Max to Total A for feeding in the setting of poor initiation, impaired attention, decreased left inattention, and visual deficits. Pt with slightly prolonged, yet functional oral phase for soft textures; timely with thin liquids via straw. No oral residuals noted post-swallow. Required Min-Mod verbal and visual A for attending to midline to locate liquid. Recommend return to Dysphagia 2 textures with thin liquid given Total A for adequate intake; per chart review pt with poor appetite and intake in the setting of aforementioned cognitive deficits. LPN notified of recommendations and verbalized understanding. ? ?Session concluded with pt in recliner chair, safety belt donned (Max A to allow staff to don), LPN present, and call bell within reach. Continue per current ST POC. ? ?Pain ?No pain reported; NAD noted ? ?Therapy/Group: Individual Therapy ? ?Sarah Myers ?04/18/2022, 12:31 PM ?

## 2022-04-18 NOTE — Progress Notes (Signed)
Initial Nutrition Assessment ? ?DOCUMENTATION CODES:  ? ?Not applicable ? ?INTERVENTION:  ? ?Encourage PO intake ?Continue MVI with minerals daily ?Boost Breeze po BID, each supplement provides 250 kcal and 9 grams of protein ? ?NUTRITION DIAGNOSIS:  ? ?Inadequate oral intake related to decreased appetite, acute illness as evidenced by meal completion < 50%. - Ongoing ? ?GOAL:  ? ?Patient will meet greater than or equal to 90% of their needs - Ongoing  ? ?MONITOR:  ? ?PO intake, Supplement acceptance, Diet advancement ? ?REASON FOR ASSESSMENT:  ? ?Malnutrition Screening Tool ?  ? ?ASSESSMENT:  ? ?Pt admitted to CIR with functional deficits after admission for basal ganglia hemorrhage. PMH significant for HTN. ? ?Pt sitting in chair, daughter at bedside. ?Pt reports that she is doing ok and wants a different menu to select foods from.  ?Daughter reports that she believes the pt is eating pretty well. States that she had cereal for breakfast this morning. Pt is currently taking meds, crushed in yogurt. Pt PEG tube is not being used for anything at this time, per daughter plan to have removed.  ?Per EMR, pt intake includes: ?4/30: Breakfast 25%, Lunch 40%, Dinner 25% ?5/01: Breakfast 50%, Dinner 0% ?5/02: Breakfast 10%, Lunch 25%, Dinner 30% ? ?Discussed ONS with pt, daughter reports that he mom has drank some Ensure's. RD to trial Boost Breeze with pt to help with achieving nutritional needs.  ?If pt PO intake does not increase, may need to consider supplementing with enteral nutrition via PEG.  ? ?Medications reviewed and include: Pepcid, SSI 0-9 units TID, Magnesium Gluconate, MVI, Potassium Chloride ?Labs reviewed. ? ?Diet Order:   ?Diet Order   ? ?       ?  DIET DYS 2 Room service appropriate? Yes; Fluid consistency: Thin  Diet effective now       ?  ? ?  ?  ? ?  ? ?EDUCATION NEEDS:  ? ?No education needs have been identified at this time ? ?Skin:  Skin Assessment: Reviewed RN Assessment ? ?Last BM:   5/1 ? ?Height:  ?Ht Readings from Last 1 Encounters:  ?04/04/22 5\' 4"  (1.626 m)  ? ?Weight:  ?Wt Readings from Last 1 Encounters:  ?04/19/22 67.5 kg  ? ?BMI:  Body mass index is 25.54 kg/m?. ? ?Estimated Nutritional Needs:  ?Kcal:  1800-2000 ?Protein:  90-105g ?Fluid:  >/=1.8L ? ? ?06/19/22 RD, LDN ?Clinical Dietitian ?See AMiON for contact information.  ?

## 2022-04-18 NOTE — Progress Notes (Signed)
Occupational Therapy Session Note ? ?Patient Details  ?Name: Sarah Myers ?MRN: [id number] ?Date of Birth: [birth year] ? ?Today's Date: 04/18/2022 ?OT Individual Time: [id number] ?OT Individual Time Calculation (min): 55 min  ? ? ?Short Term Goals: ?Week 2:  OT Short Term Goal 1 (Week 2): Pt will complete stand pivot to [facility] with min assist. ?OT Short Term Goal 2 (Week 2): Pt will complete LB dressing with min assist. ?OT Short Term Goal 3 (Week 2): Pt will scan and retrieve ADL items in all fields of vision with min multimodal cues. ?OT Short Term Goal 4 (Week 2): Pt will complete toileting with min assist. ? ? ?Skilled Therapeutic Interventions/Progress Updates:  ?  Pt sitting up in w/c, requesting to shower.  No c/o pain.  Stand pivot w/c to shower bench with mod assist.  Doffed shirt with min assist.  Doffed pants and brief sit<>stand level with mod assist.  Bathed UB using LHS with min assist for UB and mod assist for LB sit<>stand level.  Pt dried off then completed stand pivot to w/c with mod assist.  Transported to sink where she donned shirt with min assist and max assist to donn brief.  Donned pajama pants with mod assist at sit<>stand.  Pt brushed teeth seated at sink with supervision.  Pt then requesting to return to bed despite nurse, nurse tech, and OT educating pt on benefits of sitting up and encouragement to keep participating with skilled OT.  Stand pivot to EOB with mod assist.  Sit to supine with max assist.  Pt bridged towards [facility] with supervision using bed features.  Pt required max step by step Vcs and min-mod visual and tactile cues to sequence, initiate, and problem solve throughout self care and functional transfers.  Pillows under LUE to elevate and support.  Call bell in reach, bed alarm on. ? ?Therapy Documentation ?Precautions:  ?Precautions ?Precautions: Fall ?Precaution Comments: L inattention, PEG, left hemi, right head gaze ?Restrictions ?Weight Bearing Restrictions:  No ? ? ? ?Therapy/Group: Individual Therapy ? ?Caryl Asp Vernel Donlan ?04/18/2022, 1:28 PM ?

## 2022-04-18 NOTE — Progress Notes (Signed)
?                                                       PROGRESS NOTE ? ? ?Subjective/Complaints: ?No new complaints this morning ?Appeared very sleepy with increased difficulty in keeping eye open. Discussed decreasing baclofen to 10mg  HS ? ?ROS:  ? ?Pt denies SOB, abd pain, CP, N/V/C/D, and vision changes, + pain in hand intermittently, shoulder pain resolved, +left sided pain from spasticity, +daytime somnolence ? ?Objective: ?  ?No results found. ?Recent Labs  ?  04/16/22 ?2347  ?WBC 4.0  ?HGB 12.4  ?HCT 37.1  ?PLT 147*  ? ? ? ?Recent Labs  ?  04/16/22 ?2347  ?NA 137  ?K 3.5  ?CL 109  ?CO2 21*  ?GLUCOSE 94  ?BUN 7  ?CREATININE 0.83  ?CALCIUM 10.8*  ? ? ? ?Intake/Output Summary (Last 24 hours) at 04/18/2022 1204 ?Last data filed at 04/18/2022 0700 ?Gross per 24 hour  ?Intake 10 ml  ?Output --  ?Net 10 ml  ? ?  ? ?  ? ?Physical Exam: ?Vital Signs ?Blood pressure 122/63, pulse 64, temperature 97.8 ?F (36.6 ?C), resp. rate 17, height 5\' 4"  (1.626 m), weight 67 kg, SpO2 100 %. ? ?General: No acute distress, BMI 25.35 ?Mood and affect are appropriate ?Heart: Regular rate and rhythm no rubs murmurs or extra sounds ?Lungs: Clear to auscultation, breathing unlabored, no rales or wheezes ?Abdomen: Positive bowel sounds, soft nontender to palpation, nondistended ?Extremities: No clubbing, cyanosis, or edema ? ? ?Musculoskeletal: Patch on L shoulder ?   Cervical back: Neck supple. No tenderness.  ?   Comments: RUE- 4/5 or more- hard to test ?RLE 3+/5 proximally and 4-/5 distally ?LUE- 0/5 ?LLE- 2-/5 in HF and PF only ?  ?Skin: ?   General: Skin is warm and dry.  ?   Comments: Scab 1.5 cm on L forearm- otherwise skin looks OK  ?Neurological:  ?   Mental Status: She is alert and oriented to person, place, and time.  ?   Comments: Severe L inattention/neglect ?Intact to light touch in all 4 extremities ?Dysconjugate gaze, but denies double vision.  ?MAS of 2-3 in LUE_ shoulder MAS of 3; elbow 2-3 and MAS of 2 in L wrist ?MAS 3 L  hamstring  ?Puffy L hand ?Squat pivot modA ?Right eye ptosis ? ?Psychiatric:     ?   Mood and Affect: Depressed mood ?   Comments: vague  ? ? ?Assessment/Plan: ?1. Functional deficits which require 3+ hours per day of interdisciplinary therapy in a comprehensive inpatient rehab setting. ?Physiatrist is providing close team supervision and 24 hour management of active medical problems listed below. ?Physiatrist and rehab team continue to assess barriers to discharge/monitor patient progress toward functional and medical goals ? ?Care Tool: ? ?Bathing ?   ?Body parts bathed by patient: Front perineal area, Buttocks  ? Body parts bathed by helper: Buttocks, Front perineal area ?  ?  ?Bathing assist Assist Level: Maximal Assistance - Patient 24 - 49% ?  ?  ?Upper Body Dressing/Undressing ?Upper body dressing   ?What is the patient wearing?: Hospital gown only ?   ?Upper body assist Assist Level: Moderate Assistance - Patient 50 - 74% ?   ?Lower Body Dressing/Undressing ?Lower body dressing ? ? ?   ?What is  the patient wearing?: Hospital gown only ? ?  ? ?Lower body assist Assist for lower body dressing: Maximal Assistance - Patient 25 - 49% ?   ? ?Toileting ?Toileting    ?Toileting assist Assist for toileting: Total Assistance - Patient < 25% ?  ?  ?Transfers ?Chair/bed transfer ? ?Transfers assist ?   ? ?Chair/bed transfer assist level: Moderate Assistance - Patient 50 - 74% ?  ?  ?Locomotion ?Ambulation ? ? ?Ambulation assist ? ?   ? ?Assist level: Moderate Assistance - Patient 50 - 74% ?Assistive device: Other (comment) (hall rail) ?Max distance: 30  ? ?Walk 10 feet activity ? ? ?Assist ? Walk 10 feet activity did not occur: Safety/medical concerns ? ?Assist level: Moderate Assistance - Patient - 50 - 74% ?Assistive device: Other (comment) (hall rail)  ? ?Walk 50 feet activity ? ? ?Assist Walk 50 feet with 2 turns activity did not occur: Safety/medical concerns ? ?  ?   ? ? ?Walk 150 feet activity ? ? ?Assist Walk  150 feet activity did not occur: Safety/medical concerns ? ?  ?  ?  ? ?Walk 10 feet on uneven surface  ?activity ? ? ?Assist Walk 10 feet on uneven surfaces activity did not occur: Safety/medical concerns ? ? ?  ?   ? ?Wheelchair ? ? ? ? ?Assist Is the patient using a wheelchair?: Yes ?Type of Wheelchair: Manual ?  ? ?Wheelchair assist level: Dependent - Patient 0% ?Max wheelchair distance: 174ft  ? ? ?Wheelchair 50 feet with 2 turns activity ? ? ? ?Assist ? ?  ?  ? ? ?Assist Level: Dependent - Patient 0%  ? ?Wheelchair 150 feet activity  ? ? ? ?Assist ?   ? ? ?Assist Level: Dependent - Patient 0%  ? ?Blood pressure 122/63, pulse 64, temperature 97.8 ?F (36.6 ?C), resp. rate 17, height 5\' 4"  (1.626 m), weight 67 kg, SpO2 100 %. ? ?Medical Problem List and Plan: ?1. 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            -patient may  shower- cover PEG ?            -ELOS/Goals: 14-16 days min A ? HFU scheduled ? Continue CIR- PT, OT and SLP ?2.  Antithrombotics: ?-DVT/anticoagulation:  Pharmaceutical: Other (comment)Eliquis ?            -antiplatelet therapy: none ?3. Shoulder Pain: continue Tylenol prn, Robaxin and Neurotin scheduled, Lidoderm patch.  ?4. Mood: LCSW to evaluate and provide emotional support ?            --continue sertraline 50 mg daily ?            -antipsychotic agents: n/a ?5. Neuropsych: This patient is capable of making decisions on her own behalf. ?6. Skin/Wound Care: Routine skin care checks ?7. Fluids/Electrolytes/Nutrition: Routine Is and Os and follow-up chemistries ?8: Left-sided spasticity: Primarily hamstring decrease baclofen to 10mg  HS. Kpad ordered. Add 5mg  at 2pm.  ?9: Dysphagia: continue dys 2 thin liquid diet, provided education regarding nutritious D2 foods. 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 Increase magnesium gluconate to 500mg  HS. Continue  Cozaar 25mg  daily. ?Vitals:  ? 04/17/22 2021 04/18/22 0315  ?BP: 131/75 122/63  ?Pulse: 62 64  ?Resp: 16 17  ?Temp: 98.5 ?F (36.9 ?C) 97.8 ?F (36.6 ?C)  ?SpO2: 99% 100%  ? ? ?12. Constipation: increase magnesium gluconate to 500mg  HS.  Provided with list of high fiber foods. Decrease Baclofen to 10mg  HS ?13. Hypokalemia: decrease klor to 20mg  daily. K+ 3.8, continue this dose. ? ?  Latest Ref Rng & Units 04/16/2022  ? 11:47 PM 04/13/2022  ? 11:34 AM 04/10/2022  ?  5:41 AM  ?BMP  ?Glucose 70 - 99 mg/dL 94   334   356    ?BUN 6 - 20 mg/dL 7   10   10     ?Creatinine 0.44 - 1.00 mg/dL 8.61   6.83   7.29    ?Sodium 135 - 145 mmol/L 137   138   137    ?Potassium 3.5 - 5.1 mmol/L 3.5   3.8   3.8    ?Chloride 98 - 111 mmol/L 109   107   108    ?CO2 22 - 32 mmol/L 21   22   21     ?Calcium 8.9 - 10.3 mg/dL 02.1   11.5   52.0    ? ? ?14.  CVA related fatigue, minimize sedating meds  ?15. Left sided inattention, poor visual scanning: continue to encourage turning to left.  ?16. Bradycardia: resolved, continue to monitor HR TID.  ?  ?  ? ?LOS: ?14 days ?A FACE TO FACE EVALUATION WAS PERFORMED ? ?Clint Bolder P Lasandra Batley ?04/18/2022, 12:04 PM  ? ?  ?

## 2022-04-18 NOTE — Progress Notes (Signed)
Physical Therapy Session Note ? ?Patient Details  ?Name: Sarah Myers ?MRN: [id number] ?Date of Birth: 11/30/1971 ? ?Today's Date: 04/18/2022 ?PT Individual Time: 9211-9417 ?PT Individual Time Calculation (min): 73 min  ? ?Short Term Goals: ?Week 2:  PT Short Term Goal 1 (Week 2): Pt will complete bed mobility with minA and LRAD ?PT Short Term Goal 2 (Week 2): Pt will complete bed<>chair transfers with minA and LRAD ?PT Short Term Goal 3 (Week 2): Pt will ambulate 52f with maxA and LRAD ?PT Short Term Goal 4 (Week 2): Pt will initiate stair training ? ?Skilled Therapeutic Interventions/Progress Updates:  ?   ?Pt supine in bed to start with RN at bedside adminstering morning rx. Pt agreeable to PT tx. Denies pain. Flat affect with R gaze preference and L neglect. Pt partially dressed, wearing a loose fitting t-shirt. Pt agreeable to complete LB dressing. Donned pajama pants with maxA bed level, socks/shoes totalA. Pt requiring modA for rolling to her R using hospital bed features. Supine<>sitting EOB with HOB elevated and modA for LLE management and trunk support. Pt requires minA for forward scooting to EOB. Significant tone in LLE/LUE present, especially in adductors and hamstrings on lower extremity. Sitting balance with SBA and modA for squat<>pivot transfer to her L with instruction for visual scanning to her L.  ? ?In her wheelchair, she was instructed in w/c propulsion via hemi technique. She required totalA (pt <25%) for propelling herself 275fon level surfaces. She's unable to manage barriers on her L side due to neglect despite max cues. She was unable to manage straight path due to motor apraxia and difficulty dissociating RUE/RLE movement patterns.  ? ?Transported to day room rehab gym and modA squat<>pivot to NuHart[facility] FinancialUsing BLE and RUE, resistance set to 6, she was able to complete 4 active minutes but required PT facilitation for LLE to neutral due to "frog legging" and hip abduction. Due to L neglect  and weakness, pt unable to pull L knee to neutral without assist. 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  ? ?Transferred back to w/c with modA squat<>pivot and wheeled to main rehab gym inside // bars. Sit<>Stand in // bars with her pulling herself to standing with minA and L knee blocked. Required minA for standing in // bars with RUE support. Worked on lateral weight shifting, pre-gait training, postural awareness, and midline orientation while standing in bars - modA for pre-gait training including forward/backward stepping of RLE (unable to advance LLE upon command).  ? ?Returned to her room and pt requesting to lay back in bed. Tried to encourage her to remain OOB but pt adament on returning to bed. Squat<>pivot transfer with modA back to bed and modA required for sit>supine for LLE and trunk support. Pillow under LUE, call bell in lap, HOB elevated, all needs met.  ? ?Therapy Documentation ?Precautions:  ?Precautions ?Precautions: Fall ?Precaution Comments: L inattention, PEG, left hemi, right head gaze ?Restrictions ?Weight Bearing Restrictions: No ?General: ?  ? ? ?Therapy/Group: Individual Therapy ? ?Talene Glastetter P Audi Wettstein ?04/18/2022, 7:24 AM  ?

## 2022-04-19 DIAGNOSIS — I619 Nontraumatic intracerebral hemorrhage, unspecified: Secondary | ICD-10-CM | POA: Diagnosis not present

## 2022-04-19 LAB — GLUCOSE, CAPILLARY
Glucose-Capillary: 113 mg/dL — ABNORMAL HIGH (ref 70–99)
Glucose-Capillary: 134 mg/dL — ABNORMAL HIGH (ref 70–99)
Glucose-Capillary: 94 mg/dL (ref 70–99)
Glucose-Capillary: 114 mg/dL — ABNORMAL HIGH (ref 70–99)

## 2022-04-19 MED ORDER — POTASSIUM CHLORIDE 20 MEQ PO PACK
40.0000 meq | PACK | Freq: Every day | ORAL | Status: DC
  Administered 2022-04-20 – 2022-04-26 (×7): 40 meq via ORAL
  Filled 2022-04-19 (×7): qty 2

## 2022-04-19 MED ORDER — BOOST / RESOURCE BREEZE PO LIQD CUSTOM
1.0000 | Freq: Two times a day (BID) | ORAL | Status: DC
  Administered 2022-04-19 – 2022-04-26 (×14): 1 via ORAL

## 2022-04-19 MED ORDER — ENSURE ENLIVE PO LIQD: 237.0000 mL | ORAL | Status: DC

## 2022-04-19 NOTE — Progress Notes (Addendum)
Patient ID: Warden Fillers, female   DOB: December 11, 1971, 51 y.o.   MRN: [id number]  Met with [name] who is here for hands on education to discuss team conference progress update and moving up discharge date to 5/8. She questions reason why informed her due to lack of progress and mom's participation. She reports she will be here to encourage her and push her to participate and feels she did do well in her OT session. Discussed equipment-hospital bed, wheelchair, drop-am bedside commode, no walker at this time. Have messaged team regarding families request of keeping the discharge date for 5/10. Will await MD response. Attempted to call Breosha-daughter to update and there was no answer or voice mail set up ? ?1:30 PM MD has decided to allow pt to stay until her original discharge date of 5/10 as long as she participates in therapies. ?

## 2022-04-19 NOTE — Progress Notes (Signed)
Occupational Therapy Session Note ? ?Patient Details  ?Name: Sarah Myers ?MRN: [id number] ?Date of Birth: Dec 20, 1970 ? ?Today's Date: 04/19/2022 ?OT Individual Time: 4503-8882 ?OT Individual Time Calculation (min): 53 min  ? ? ?Short Term Goals: ?Week 1:  OT Short Term Goal 1 (Week 1): Pt will complete UB dressing with min assist ?OT Short Term Goal 1 - Progress (Week 1): Met ?OT Short Term Goal 2 (Week 1): Pt will complete squat pivot BSC transfer with mod assist ?OT Short Term Goal 2 - Progress (Week 1): Met ?OT Short Term Goal 3 (Week 1): Pt will complete LB dressing with mod assist ?OT Short Term Goal 3 - Progress (Week 1): Met ?OT Short Term Goal 4 (Week 1): Pt will complete oral hygiene with setup and min VCs for visual scanning and item retrieval. ?OT Short Term Goal 4 - Progress (Week 1): Met ? ?Skilled Therapeutic Interventions/Progress Updates:  ?  1:1. Pt and daughter [name] present for session. Daughter requesting to initiate family training/hands on work. OT demo squat pivot transfers to R 2x during session and sit to stand at sink. OT edu re LLE positioning, hand placement, body mechanics, wide BOS, scooting to the edge of surface, gait belt use, and hemi strategy. Daughter needs coaching on letting pt participate to max potential and wait to allow pt to initiate movements/ADL steps. Pt requires MOD A to don shirt, MAX A to don pants. Pt and daughter practice 4 sit to stands at the sink with increased cuing from OT to daughter to facilitate LLE stability with standing. Pt daughter reporting only giving "light assist" for standing as daughter advances pants past hips. Provided handout with written equipment likely to have at DC with OT educating need to discuss with PT what is recommended. Exited session with pt seated in recliner, exit alarm on and call light in reach ?  ? ?Therapy Documentation ?Precautions:  ?Precautions ?Precautions: Fall ?Precaution Comments: L inattention, PEG, left hemi, right  head gaze ?Restrictions ?Weight Bearing Restrictions: No ?General: ?  ? ?Therapy/Group: Individual Therapy ? ?[name] ?04/19/2022, 10:28 AM ?

## 2022-04-19 NOTE — Progress Notes (Signed)
?                                                       PROGRESS NOTE ? ? ?Subjective/Complaints: ?No new complaints this morning ?She slept well last night- discussed maintaining 10mg  Baclofen at HS, she is more alert this morning ? ?ROS:  ? ?Pt denies SOB, abd pain, CP, N/V/C/D, and vision changes, + pain in hand intermittently, shoulder pain resolved, +left sided pain from spasticity, +daytime somnolence- improved ? ?Objective: ?  ?No results found. ?Recent Labs  ?  04/16/22 ?2347  ?WBC 4.0  ?HGB 12.4  ?HCT 37.1  ?PLT 147*  ? ? ? ?Recent Labs  ?  04/16/22 ?2347  ?NA 137  ?K 3.5  ?CL 109  ?CO2 21*  ?GLUCOSE 94  ?BUN 7  ?CREATININE 0.83  ?CALCIUM 10.8*  ? ? ? ?Intake/Output Summary (Last 24 hours) at 04/19/2022 1029 ?Last data filed at 04/18/2022 1753 ?Gross per 24 hour  ?Intake 160 ml  ?Output --  ?Net 160 ml  ? ?  ? ?  ? ?Physical Exam: ?Vital Signs ?Blood pressure (!) 158/76, pulse 64, temperature 98.9 ?F (37.2 ?C), temperature source Oral, resp. rate 18, height 5\' 4"  (1.626 m), weight 67.5 kg, SpO2 99 %. ? ?General: No acute distress, BMI 25.54 ?Mood and affect are appropriate ?Heart: Regular rate and rhythm no rubs murmurs or extra sounds ?Lungs: Clear to auscultation, breathing unlabored, no rales or wheezes ?Abdomen: Positive bowel sounds, soft nontender to palpation, nondistended ?Extremities: No clubbing, cyanosis, or edema ? ? ?Musculoskeletal: Patch on L shoulder ?   Cervical back: Neck supple. No tenderness.  ?   Comments: RUE- 4/5 or more- hard to test ?RLE 3+/5 proximally and 4-/5 distally ?LUE- 0/5 ?LLE- 2-/5 in HF and PF only ?  ?Skin: ?   General: Skin is warm and dry.  ?   Comments: Scab 1.5 cm on L forearm- otherwise skin looks OK  ?Neurological:  ?   Mental Status: She is alert and oriented to person, place, and time.  ?   Comments: Severe L inattention/neglect ?Intact to light touch in all 4 extremities ?Dysconjugate gaze, but denies double vision.  ?MAS of 2-3 in LUE_ shoulder MAS of 3; elbow 2-3  and MAS of 2 in L wrist ?MAS 3 L hamstring  ?Puffy L hand ?Squat and stand pivot modA. [place]offing shirt MinA ?Right eye ptosis ? ?Psychiatric:     ?   Mood and Affect: Depressed mood ?   Comments: vague  ? ? ?Assessment/Plan: ?1. Functional deficits which require 3+ hours per day of interdisciplinary therapy in a comprehensive inpatient rehab setting. ?Physiatrist is providing close team supervision and 24 hour management of active medical problems listed below. ?Physiatrist and rehab team continue to assess barriers to discharge/monitor patient progress toward functional and medical goals ? ?Care Tool: ? ?Bathing ?   ?Body parts bathed by patient: Front perineal area, Buttocks  ? Body parts bathed by helper: Buttocks, Front perineal area ?  ?  ?Bathing assist Assist Level: Maximal Assistance - Patient 24 - 49% ?  ?  ?Upper Body Dressing/Undressing ?Upper body dressing   ?What is the patient wearing?: [place] only ?   ?Upper body assist Assist Level: Moderate Assistance - Patient 50 - 74% ?   ?Lower Body  Dressing/Undressing ?Lower body dressing ? ? ?   ?What is the patient wearing?: [place] only ? ?  ? ?Lower body assist Assist for lower body dressing: Maximal Assistance - Patient 25 - 49% ?   ? ?Toileting ?Toileting    ?Toileting assist Assist for toileting: Total Assistance - Patient < 25% ?  ?  ?Transfers ?Chair/bed transfer ? ?Transfers assist ?   ? ?Chair/bed transfer assist level: Moderate Assistance - Patient 50 - 74% ?  ?  ?Locomotion ?Ambulation ? ? ?Ambulation assist ? ?   ? ?Assist level: Moderate Assistance - Patient 50 - 74% ?Assistive device: Other (comment) (hall rail) ?Max distance: 30  ? ?Walk 10 feet activity ? ? ?Assist ? Walk 10 feet activity did not occur: Safety/medical concerns ? ?Assist level: Moderate Assistance - Patient - 50 - 74% ?Assistive device: Other (comment) (hall rail)  ? ?Walk 50 feet activity ? ? ?Assist Walk 50 feet with 2 turns activity did not occur: Safety/medical  concerns ? ?  ?   ? ? ?Walk 150 feet activity ? ? ?Assist Walk 150 feet activity did not occur: Safety/medical concerns ? ?  ?  ?  ? ?Walk 10 feet on uneven surface  ?activity ? ? ?Assist Walk 10 feet on uneven surfaces activity did not occur: Safety/medical concerns ? ? ?  ?   ? ?Wheelchair ? ? ? ? ?Assist Is the patient using a wheelchair?: Yes ?Type of Wheelchair: Manual ?  ? ?Wheelchair assist level: Dependent - Patient 0% ?Max wheelchair distance: 141ft  ? ? ?Wheelchair 50 feet with 2 turns activity ? ? ? ?Assist ? ?  ?  ? ? ?Assist Level: Dependent - Patient 0%  ? ?Wheelchair 150 feet activity  ? ? ? ?Assist ?   ? ? ?Assist Level: Dependent - Patient 0%  ? ?Blood pressure (!) 158/76, pulse 64, temperature 98.9 ?F (37.2 ?C), temperature source Oral, resp. rate 18, height 5\' 4"  (1.626 m), weight 67.5 kg, SpO2 99 %. ? ?Medical Problem List and Plan: ?1. 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            -patient may  shower- cover PEG ?            -ELOS/Goals: 14-16 days min A ? HFU scheduled ? Continue CIR- PT, OT and SLP ?2.  Antithrombotics: ?-DVT/anticoagulation:  Pharmaceutical: Other (comment)Eliquis ?            -antiplatelet therapy: none ?3. Shoulder Pain: continue Tylenol prn, Robaxin and Neurotin scheduled, Lidoderm patch.  ?4. Mood: LCSW to evaluate and provide emotional support ?            --continue sertraline 50 mg daily ?            -antipsychotic agents: n/a ?5. Neuropsych: This patient is capable of making decisions on her own behalf. ?6. Skin/Wound Care: Routine skin care checks ?7. Fluids/Electrolytes/Nutrition: Routine Is and Os and follow-up chemistries ?8: Left-sided spasticity: Primarily hamstring decrease baclofen to 10mg  HS. Kpad ordered. Add 5mg  at 2pm.  ?9: Dysphagia: continue dys 2 thin liquid diet, provided education regarding nutritious D2 foods. 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 Increase magnesium gluconate to 500mg  HS. Continue Cozaar 25mg  daily. Increase klor to 63meq daily. ?Vitals:  ? 04/18/22 1919 04/19/22 0435  ?BP: 134/73 (!) 158/76  ?Pulse: 60 64  ?Resp: 17 18  ?Temp: 98.2 ?F (  36.8 ?C) 98.9 ?F (37.2 ?C)  ?SpO2: 99% 99%  ? ? ?12. Constipation: increase magnesium gluconate to 500mg  HS. Provided with list of high fiber foods. Decrease Baclofen to 10mg  HS ?13. Hypokalemia: increase klor to 46meq daily ? ?  Latest Ref Rng & Units 04/16/2022  ? 11:47 PM 04/13/2022  ? 11:34 AM 04/10/2022  ?  5:41 AM  ?BMP  ?Glucose 70 - 99 mg/dL 94   119   110    ?BUN 6 - 20 mg/dL 7   10   10     ?Creatinine 0.44 - 1.00 mg/dL 0.83   0.92   0.94    ?Sodium 135 - 145 mmol/L 137   138   137    ?Potassium 3.5 - 5.1 mmol/L 3.5   3.8   3.8    ?Chloride 98 - 111 mmol/L 109   107   108    ?CO2 22 - 32 mmol/L 21   22   21     ?Calcium 8.9 - 10.3 mg/dL 10.8   11.1   10.6    ? ? ?14.  CVA related fatigue, minimize sedating meds  ?15. Left sided inattention, poor visual scanning: continue to encourage turning to left.  ?16. Bradycardia: resolved, continue to monitor HR TID.  ?  ?  ? ?LOS: ?15 days ?A FACE TO FACE EVALUATION WAS PERFORMED ? ?Martha Clan P Danyael Alipio ?04/19/2022, 10:29 AM  ? ?  ?

## 2022-04-19 NOTE — Progress Notes (Signed)
Physical Therapy Weekly Progress Note ? ?Patient Details  ?Name: Sarah Myers ?MRN: [id number] ?Date of Birth: 07/01/1971 ? ?Beginning of progress report period: April 11, 2022 ?End of progress report period: Apr 19, 2022 ? ?Today's Date: 04/19/2022 ?PT Individual Time: 1130-1157 ?PT Individual Time Calculation (min): 27 min  ? ?Patient has met 0 of 4 short term goals. Pt has made very limited progress this reporting period. Slow progress contributed by apathy towards therapy, decreased participation and engagement in therapy, severity of cognitive deficits, development of L sided tone, and significant L sided neglect and weakness. Her care plan has been adjusted to 15/7 to accommodate for inability to tolerate 3+ hours of daily therapy. Overall, she continues to require modA for bed mobility, CGA for sitting balance, modA for squat<>pivot transfers, and is totalA for wheelchair mobility. She is nonambulatory to date other than ambulating ~32f with +2 maxA using R hand rail. Anticipate wheelchair level at discharge.  ? ?Patient continues to demonstrate the following deficits muscle weakness and muscle joint tightness, decreased cardiorespiratoy endurance, impaired timing and sequencing, abnormal tone, unbalanced muscle activation, motor apraxia, decreased coordination, and decreased motor planning, decreased visual acuity, decreased visual perceptual skills, decreased visual motor skills, and field cut, decreased midline orientation, left side neglect, and decreased motor planning, decreased initiation, decreased attention, decreased awareness, decreased problem solving, decreased safety awareness, decreased memory, and delayed processing, and decreased sitting balance, decreased standing balance, decreased postural control, hemiplegia, and decreased balance strategies and therefore will continue to benefit from skilled PT intervention to increase functional independence with mobility. ? ?Patient not progressing  toward long term goals.  See goal revision..   Please see POC note for details. ? ?PT Short Term Goals ?Week 2:  PT Short Term Goal 1 (Week 2): Pt will complete bed mobility with minA and LRAD ?PT Short Term Goal 1 - Progress (Week 2): Not met ?PT Short Term Goal 2 (Week 2): Pt will complete bed<>chair transfers with minA and LRAD ?PT Short Term Goal 2 - Progress (Week 2): Not met ?PT Short Term Goal 3 (Week 2): Pt will ambulate 264fwith maxA and LRAD ?PT Short Term Goal 3 - Progress (Week 2): Not met ?PT Short Term Goal 4 (Week 2): Pt will initiate stair training ?PT Short Term Goal 4 - Progress (Week 2): Not met ?Week 3:  PT Short Term Goal 1 (Week 3): STG = LTG due to ELOS ? ?Skilled Therapeutic Interventions/Progress Updates:  ?   ?Pt sitting in recliner to start with her youngest daughter at bedside. This therapist was informed earlier that the patient requested female only therapists moving forward. Spoke with daughter outside the room who confirmed this. We also dicussed PT goals, pt's current mobility status, barriers related to progression, DME rec's (w/c, hospital bed), general home safety recommendations, and primary deficits from her CVA. CSW also present for part the discussion. Daughter understanding of all.  ? ?Therapy Documentation ?Precautions:  ?Precautions ?Precautions: Fall ?Precaution Comments: L inattention, PEG, left hemi, right head gaze ?Restrictions ?Weight Bearing Restrictions: No ?General: ?  ? ?Therapy/Group: Individual Therapy ? ?ChAlger SimonsT ?04/19/2022, 7:24 AM  ?

## 2022-04-19 NOTE — Plan of Care (Signed)
Goals downgraded due to severity and persistence of deficits (poor carryover from session to session), in addition to pt's lack of progress and participation in therapeutic tasks despite encouragement. ? ?Problem: RH Swallowing ?Goal: LTG Patient will consume least restrictive diet using compensatory strategies with assistance (SLP) ?Description: LTG:  Patient will consume least restrictive diet using compensatory strategies with assistance (SLP) ?Flowsheets (Taken 04/19/2022 5176) ?LTG: Pt Patient will consume least restrictive diet using compensatory strategies with assistance of (SLP): Total Assistance - Patient < 25% ?Goal: LTG Patient will participate in dysphagia therapy to increase swallow function with assistance (SLP) ?Description: LTG:  Patient will participate in dysphagia therapy to increase swallow function with assistance (SLP) ?Flowsheets (Taken 04/19/2022 1607) ?LTG: Pt will participate in dysphagia therapy to increase swallow function with assistance of (SLP): Maximal Assistance - Patient 25 - 49% ?  ?Problem: RH Problem Solving ?Goal: LTG Patient will demonstrate problem solving for (SLP) ?Description: LTG:  Patient will demonstrate problem solving for basic/complex daily situations with cues  (SLP) ?Flowsheets (Taken 04/19/2022 3710) ?LTG: Patient will demonstrate problem solving for (SLP): Basic daily situations ?LTG Patient will demonstrate problem solving for: Total Assistance - Patient < 25% ?  ?Problem: RH Attention ?Goal: LTG Patient will demonstrate this level of attention during functional activites (SLP) ?Description: LTG:  Patient will will demonstrate this level of attention during functional activites (SLP) ?Flowsheets (Taken 04/19/2022 6269) ?Patient will demonstrate during cognitive/linguistic activities the attention type of: Sustained ?Patient will demonstrate this level of attention during cognitive/linguistic activities in: Home ?LTG: Patient will demonstrate this level of attention  during cognitive/linguistic activities with assistance of (SLP): Moderate Assistance - Patient 50 - 74% ?Number of minutes patient will demonstrate attention during cognitive/linguistic activities: 5 ?  ?Problem: RH Awareness ?Goal: LTG: Patient will demonstrate awareness during functional activites type of (SLP) ?Description: LTG: Patient will demonstrate awareness during functional activites type of (SLP) ?Flowsheets ?Taken 04/19/2022 0612 by Sarita Bottom A, CCC-SLP ?LTG: Patient will demonstrate awareness during cognitive/linguistic activities with assistance of (SLP): Maximal Assistance - Patient 25 - 49% ?Taken 04/05/2022 1508 by Huston Foley, CCC-SLP ?Patient will demonstrate during cognitive/linguistic activities awareness type of: Emergent ?  ?

## 2022-04-19 NOTE — Progress Notes (Signed)
Speech Language Pathology Weekly Progress and Session Note ? ?Patient Details  ?Name: Sarah Myers ?MRN: [id number] ?Date of Birth: July 17, 1971 ? ?Beginning of progress report period: April 12, 2022 ?End of progress report period: Apr 19, 2022 ? ?Today's Date: 04/19/2022 ?SLP Individual Time: 1200-1300 ?SLP Individual Time Calculation (min): 60 min ? ?Short Term Goals: ?Week 2: SLP Short Term Goal 1 (Week 2): Patient will demonstrate efficient mastication and complete oral clearance with trials of Dys. 3 textures without overt s/s of aspiration with supervision level verbal cues over 2 sessions prior to upgrade. ?SLP Short Term Goal 1 - Progress (Week 2): Not met ?SLP Short Term Goal 2 (Week 2): Patient will demonstrate sustained attention to functional tasks for 10 minutes with Mod verbal cues for redirection. ?SLP Short Term Goal 2 - Progress (Week 2): Not progressing ?SLP Short Term Goal 3 (Week 2): Patient will demonstrate functional problem solving for basic and familiar tasks with mod verbal cues. ?SLP Short Term Goal 3 - Progress (Week 2): Not met ?SLP Short Term Goal 4 (Week 2): Patient will attend/scan to midline during functional tasks with Min verbal cues. ?SLP Short Term Goal 4 - Progress (Week 2): Met ? ?  ?New Short Term Goals: ?Week 3: SLP Short Term Goal 1 (Week 3): STG's = LTG's due to ELOS ? ?Weekly Progress Updates: ?Pt demonstrates poor progress towards meeting STG's this week; only met 1 out of 4 goals. Barriers to progress include visual perception deficits, severity of deficits, low motivation/apathy towards therapeutic interventions, fatigue, poor generalization/carryover, and poor insight. Pt education has been ongoing with no evidence of learning from session to session. Family present this date to begin family training of upon discharge. SLP recommends ongoing intervention for d/c planning and family education, in addition to monitoring for diet upgrade, and providing reinforcement of  compensatory strategies. Continue per updated POC. ? ?Intensity: Minumum of 1-2 x/day, 30 to 90 minutes ?Frequency: 3 to 5 out of 7 days ?Duration/Length of Stay: 3-4 weeks ?Treatment/Interventions: Cognitive remediation/compensation;Internal/external aids;Dysphagia/aspiration precaution training;Cueing hierarchy;Environmental controls;Therapeutic Activities;Functional tasks;Patient/family education;Speech/Language facilitation ? ? ?Daily Session ? ?Skilled Therapeutic Interventions: Pt seen this date for skilled ST interventions targeting dysphagia and cognitive-linguistic goals outlined in care plan. Pt received awake/alert and OOB in recliner chair attempting to sit at the edge of chair, wanting to get up. Sarah Myers at bedside. Pt stating that she would like to go back to bed at this time. With question prompts, communicated need to go to the bathroom. Transferred via [place] with Min A; continent of bowel and bladder. Agreeable to ST intervention. ? ?SLP facilitated today's session by providing providing consistent Min verbal A for visual attention to L field during functional task of self-feeding. Self-administered bites of food from meal tray with Mod A for initiation. Attended to meal for ~15 minutes prior to demonstrating fatigue and inattention. Benefited from intermittent rest breaks. Oriented to date with Min-Mod vs Mod-Max verbal and visual A. Named common objects in room with <50% accuracy and required placement in hand to accurately name item. No evidence of carryover and limited insight demonstrated by pt.  ? ?Provided extensive verbal education to pt's Sarah Myers re: ST POC, cognitive impairments, barriers to progress, Dysphagia 2 vs 3 textures, and need for full supervision and Mod to Max A for PO intake. Pt's Sarah Myers verbalized understanding and asked appropriate questions re: the above information, which were answered to pt's satisfaction.  ? ?Pt left in bed, bed alarm on, call bell within reach,  and  all immediate needs met. Sarah Myers at bedside. Continue per updated ST POC. ?   ? ?Pain ?No pain reported this session; NAD noted ? ?Therapy/Group: Individual Therapy ? ?Rourke Mcquitty A Huey Scalia ?04/19/2022, 1:35 PM ? ? ? ? ? ? ?

## 2022-04-19 NOTE — Plan of Care (Signed)
?  Problem: RH Ambulation ?Goal: LTG Patient will ambulate in home environment (PT) ?Description: LTG: Patient will ambulate in home environment, # of feet with assistance (PT). ?Outcome: Not Applicable ?Note: Pt will not be ambulating at home due to safety concerns and severity of deficits ?  ?Problem: RH Stairs ?Goal: LTG Patient will ambulate up and down stairs w/assist (PT) ?Description: LTG: Patient will ambulate up and down # of stairs with assistance (PT) ?Outcome: Not Applicable ?Note: Pt will be unable to safely navigate the full flight of steps to enter her apartment by time of DC. Will require nonemergency ambulance transportation home.  ?  ?Problem: RH Car Transfers ?Goal: LTG Patient will perform car transfers with assist (PT) ?Description: LTG: Patient will perform car transfers with assistance (PT). ?Flowsheets (Taken 04/19/2022 [id number]) ?LTG: Pt will perform car transfers with assist:: Moderate Assistance - Patient 50 - 74% ?  ?Problem: RH Ambulation ?Goal: LTG Patient will ambulate in controlled environment (PT) ?Description: LTG: Patient will ambulate in a controlled environment, # of feet with assistance (PT). ?Flowsheets (Taken 04/19/2022 [id number]) ?LTG: Pt will ambulate in controlled environ  assist needed:: Maximal Assistance - Patient 25 - 49% ?LTG: Ambulation distance in controlled environment: 30ft ?  ?Problem: RH Wheelchair Mobility ?Goal: LTG Patient will propel w/c in controlled environment (PT) ?Description: LTG: Patient will propel wheelchair in controlled environment, # of feet with assist (PT) ?Flowsheets (Taken 04/19/2022 [id number]) ?LTG: Pt will propel w/c in controlled environ  assist needed:: Maximal Assistance - Patient 25 - 49% ?LTG: Propel w/c distance in controlled environment: 85ft ?Goal: LTG Patient will propel w/c in home environment (PT) ?Description: LTG: Patient will propel wheelchair in home environment, # of feet with assistance (PT). ?Flowsheets (Taken 04/19/2022 [id number]) ?LTG: Pt will  propel w/c in home environ  assist needed:: Maximal Assistance - Patient 25 - 49% ?LTG: Propel w/c distance in home environment: 26ft ?  ?

## 2022-04-19 NOTE — Progress Notes (Signed)
Patient woke up complaining to chest pain states it feels like she slept wrong and described it as muscular pain. Pt was found curled up in a ball and crooked in the bed. Repositioned and sat upright, stated it was easing up. Rechecked patient 15 minutes later and patient was sleeping, pt stated she was feeling better.  ?Dan PA made aware, will continue to monitor. ?

## 2022-04-19 NOTE — Patient Care Conference (Signed)
Inpatient RehabilitationTeam Conference and Plan of Care Update ?Date: 04/19/2022   Time: 11:04 AM  ? ? ?Patient Name: Sarah Myers      ?Medical Record Number: [id number]  ?Date of Birth: 11-Aug-1971 ?Sex: Female         ?Room/Bed: 4M01C/4M01C-01 ?Payor Info: Payor: [employer] / Plan: [employer] / Product Type: *No Product type* /   ? ?Admit Date/Time:  04/04/2022  3:17 PM ? ?Primary Diagnosis:  ICH (intracerebral hemorrhage) (HCC) ? ?Hospital Problems: Principal Problem: ?  ICH (intracerebral hemorrhage) (HCC) ? ? ? ?Expected Discharge Date: Expected Discharge Date: 04/24/22 ? ?Team Members Present: ?Physician leading conference: Dr. Sula Soda ?Social Worker Present: Dossie Der, LCSW ?Nurse Present: Chana Bode, RN ?PT Present: Wynelle Link, PT ?OT Present: Annye English, OT ?SLP Present: Other (comment) Sarita Bottom, SLP) ?PPS Coordinator present : Fae Pippin, SLP ? ?   Current Status/Progress Goal Weekly Team Focus  ?Bowel/Bladder ? ? cont/incont b/b LBM 5/1  maintain continence  assess q shift and prn   ?Swallow/Nutrition/ Hydration ? ? Remains on dysphagia 2 with thin liuids  Max to Total A for self-feeding (downgraded from Mod I) due to visual, attention, problem-solving/sequencing, and motor initiation deficits. Additionally, pt with poor appetite  Conitnue with Dysphagia 3 trials   ?ADL's ? ? Stand pivot bathroom t/fs mod assist;  Bathed UB using LHS with min assist for UB and mod assist for LB sit<>stand level;  donned shirt with min assist and mod to max assist for LBD; oral care seated supervision; apathy and max step by step Vcs and min-mod visual and tactile cues to sequence, initiate, and problem solve throughout self care and functional transfers.  min assist  self care training with hemi strategies, functional transfer training and balance, neuro re-ed, visual scanning/left attention training, caregiver and pt training/edu, dc planning   ?Mobility ? ? Limited  progress since last reporting period. ModA bed mobility, CGA sitting balance, modA squat<>pivot transfers. L neglect and R gaze remains prominent. Apathetic towards therapy with limited participatin and engagement. Spoke with sister on the phone regarding this. Frequency decreased to 15/7.  minA overall - downgraded to modA and DC ambulation/stair goal  LLE NMR, functional transfers, family education, DC planning   ?Communication ? ? N/A  N/A  N/A   ?Safety/Cognition/ Behavioral Observations ? Min-Mod for attention to task, Mod A for visual scanning to L, Min-Mod A for recall, and Max for verbal and functional basic problem-solving/sequencing. Visual deficits continue to be a barrier to pt's participation in functional tasks.  Total A for problem-solving, Mod A for sustained attention, and Max A for awareness  functional problem-solving and seuencing, attention, and awareness - errorless learning and initiation   ?Pain ? ? no complaints of pain  remain pain free  assessq shift and prn   ?Skin ? ? skin intact  prevent new breakdown  assess q shift and prn   ? ? ?Discharge Planning:  ?Spoke with daughter regarding pt's lack of participation and apathy along with therapy team. Children to try to be here for therapies to encourage pt. Working on discharge needs-DME and follow up. Will non-emergency ambulance to get up flight of stairs to her apartment   ?Team Discussion: ?Patient able to have PEG removed 04/22/22. MD adjusting amantadine as more alert however continue to noted left neglect and right gaze, apathetic with poor participation, inattention and significant tone with leans to slow progression. 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 Patient requires max step by step tactile cues.  ? ?Patient on target to meet rehab goals: ?no, currently total assist overall. ? ?*See Care Plan and progress notes for long and short-term goals.  ? ?Revisions to Treatment Plan:   ?Downgraded goals ?15/7 therapy ?OP Botox treatment ? ?Teaching Needs: ?Safety, transfers, toileting, medications, etc  ?Current Barriers to Discharge: ?Decreased caregiver support, Home enviroment access/layout, and Behavior ? ?Possible Resolutions to Barriers: ?Family education; 24/7 care ?HH follow up services ?DME:[facility], TTB ?Ambulance transport to home ?(? access to follow up appointments) ?  ? ? Medical Summary ?Current Status: hypertension, decreased initiation, depression, overweight, severe left sided spasticity causing pain, left sided shoulder and hand pain, poor insight into deficits ? Barriers to Discharge: Medical stability;Weight;Behavior ? Barriers to Discharge Comments: hypertension, decreased initiation, depression, overweight, severe left sided spasticity causing pain, left sided shoulder and hand pain, poor insight into deficits ?Possible Resolutions to [employer]: continue to monitor BP TID, continue cozaar, increased potassium supplementation and monitor potassium levles, continue baclofen at night ? ? ?Continued Need for Acute Rehabilitation Level of Care: The patient requires daily medical management by a physician with specialized training in physical medicine and rehabilitation for the following reasons: ?Direction of a multidisciplinary physical rehabilitation program to maximize functional independence : Yes ?Medical management of patient stability for increased activity during participation in an intensive rehabilitation regime.: Yes ?Analysis of laboratory values and/or radiology reports with any subsequent need for medication adjustment and/or medical intervention. : Yes ? ? ?I attest that I was present, lead the team conference, and concur with the assessment and plan of the team. ? ? ?Chana Bode B ?04/19/2022, 11:43 AM  ? ? ? ? ? ? ?

## 2022-04-20 DIAGNOSIS — I10 Essential (primary) hypertension: Secondary | ICD-10-CM

## 2022-04-20 DIAGNOSIS — E876 Hypokalemia: Secondary | ICD-10-CM

## 2022-04-20 DIAGNOSIS — I2699 Other pulmonary embolism without acute cor pulmonale: Secondary | ICD-10-CM

## 2022-04-20 DIAGNOSIS — R252 Cramp and spasm: Secondary | ICD-10-CM | POA: Diagnosis not present

## 2022-04-20 DIAGNOSIS — I619 Nontraumatic intracerebral hemorrhage, unspecified: Secondary | ICD-10-CM | POA: Diagnosis not present

## 2022-04-20 DIAGNOSIS — F322 Major depressive disorder, single episode, severe without psychotic features: Secondary | ICD-10-CM

## 2022-04-20 DIAGNOSIS — K5900 Constipation, unspecified: Secondary | ICD-10-CM

## 2022-04-20 LAB — GLUCOSE, CAPILLARY
Glucose-Capillary: 98 mg/dL (ref 70–99)
Glucose-Capillary: 116 mg/dL — ABNORMAL HIGH (ref 70–99)
Glucose-Capillary: 100 mg/dL — ABNORMAL HIGH (ref 70–99)
Glucose-Capillary: 97 mg/dL (ref 70–99)

## 2022-04-20 NOTE — Progress Notes (Signed)
Physical Therapy Session Note ? ?Patient Details  ?Name: Sarah Myers ?MRN: [id number] ?Date of Birth: [birth year] ? ?Today's Date: 04/20/2022 ?PT Individual Time: 9381-8299 ?PT Individual Time Calculation (min): 75 min  ? ?Short Term Goals: ?Week 3:  PT Short Term Goal 1 (Week 3): STG = LTG due to ELOS ? ?Skilled Therapeutic Interventions/Progress Updates:  ?  Pt received seated in recliner in room, agreeable with encouragement to participate in therapy session. Pt's daughter [name] present for hands-on family education session. Demonstrated how to assist pt with squat pivot transfers recliner to/from bed with mod to max A needed with increased assist needed when transferring to the L. Pt's daughter able to perform return demonstration of transfer with min cueing needed. Sit to supine with min A needed for LLE management. Reviewed positioning in bed in supine and sidelying with use of pillows, handouts provided for positioning. Reviewed spasticity management and demonstrated stretching of LLE. Pt's daughter able to perform return demonstration of LLE stretching. Provided handout for UE/LE stretching and for education on spasticity. Discussed wearing of PRAFO boot to prevent LLE PF contracture. Boot that pt currently has for LLE is too painful for pt to tolerate, MD to order [facility]. Supine to sit with min A needed for LLE management. Squat pivot transfer back to recliner with max A to the R due to pt pushing to the L during transfer and decreased motor planning to assist with transfer. Pt left seated in recliner in room with needs in reach, quick release belt and chair alarm in place, daughter present. ? ?Therapy Documentation ?Precautions:  ?Precautions ?Precautions: Fall ?Precaution Comments: L inattention, PEG, left hemi, right head gaze ?Restrictions ?Weight Bearing Restrictions: No ? ? ? ? ? ?Therapy/Group: Individual Therapy ? ? ?Peter Congo, PT, DPT, CSRS ? ?04/20/2022, 10:41 AM  ?

## 2022-04-20 NOTE — Progress Notes (Signed)
Occupational Therapy Session Note ? ?Patient Details  ?Name: Sarah Myers ?MRN: [id number] ?Date of Birth: November 11, 1971 ? ?Today's Date: 04/20/2022 ?OT Individual Time: 1100-1200 ?OT Individual Time Calculation (min): 60 min  ? ? ?Short Term Goals: ?Week 2:  OT Short Term Goal 1 (Week 2): Pt will complete stand pivot to [facility] with min assist. ?OT Short Term Goal 2 (Week 2): Pt will complete LB dressing with min assist. ?OT Short Term Goal 3 (Week 2): Pt will scan and retrieve ADL items in all fields of vision with min multimodal cues. ?OT Short Term Goal 4 (Week 2): Pt will complete toileting with min assist. ? ? ?Skilled Therapeutic Interventions/Progress Updates:  ?  Pt sitting in recliner, daughter in bed, agreeable to family education session.  Pt completed blocked practice stand pivot with therapist first providing visual demonstration for daughter to see transfer technique recliner<>EOB.  Therapist provided mod assist for transfer.  Daughter attempted same technique however having difficulty with follow through and requiring therapist to step in and provide full mod assist.  Attempted again this time using squat pivot to downgrade.  Therapist provided visual demo w/c<>EOB then dtr practiced same technique.  She needed therapist to step in again and provide mod assist to ensure safe transfer.  Pt appearing fatigued therefore discontinued.  Sit to supine with min assist.  Educated and trained daughter on safe PROM to LUE including shoulder flexion and scaption to 90 degrees, elbow flex/ext, FA pro/sup, digital flex/ext.  Daughter provided good return demonstration.  Daughter showed this therapist measurement sheet and pictures of home layout.  Will need to collaborate with PT further to determine bathroom and bedroom accessibility.  Call bell in reach, bed alarm on. ? ?Therapy Documentation ?Precautions:  ?Precautions ?Precautions: Fall ?Precaution Comments: L inattention, PEG, left hemi, right head  gaze ?Restrictions ?Weight Bearing Restrictions: No ? ? ? ?Therapy/Group: Individual Therapy ? ?[name] Juleah Paradise ?04/20/2022, 12:36 PM ?

## 2022-04-20 NOTE — Progress Notes (Signed)
Orthopedic Tech Progress Note ?Patient Details:  ?Sarah Myers ?Jan 02, 1971 ?[id number] ? ?Ortho Devices ?Type of Ortho Device: Prafo boot/shoe ?Ortho Device/Splint Interventions: Ordered ?  ?  ? ?Delorise Royals Jaishon Krisher ?04/20/2022, 11:09 AM ?Delivered PRAFO boot. ?

## 2022-04-20 NOTE — Progress Notes (Signed)
?                                                       PROGRESS NOTE ? ? ?Subjective/Complaints: ?No new complaints this morning ?She wants to go home. ?PRAFO ordered since her current AFO is too tight ? ?ROS:  ? ?Pt denies SOB, abd pain, CP, N/V/C/D, and vision changes, + pain in hand intermittently, shoulder pain resolved, +left sided pain from spasticity, +daytime somnolence- improved, AFO is uncomfortable for her ? ?Objective: ?  ?No results found. ?No results for input(s): WBC, HGB, HCT, PLT in the last 72 hours. ? ? ? ?No results for input(s): NA, K, CL, CO2, GLUCOSE, BUN, CREATININE, CALCIUM in the last 72 hours. ? ? ? ?Intake/Output Summary (Last 24 hours) at 04/20/2022 1055 ?Last data filed at 04/20/2022 [phone] ?Gross per 24 hour  ?Intake 480 ml  ?Output --  ?Net 480 ml  ? ?  ? ?  ? ?Physical Exam: ?Vital Signs ?Blood pressure 129/88, pulse 61, temperature 98.7 ?F (37.1 ?C), resp. rate 18, height 5\' 4"  (1.626 m), weight 67.5 kg, SpO2 98 %. ? ?General: No acute distress, BMI 25.54 ?Mood and affect are appropriate ?Heart: Regular rate and rhythm no rubs murmurs or extra sounds ?Lungs: Clear to auscultation, breathing unlabored, no rales or wheezes ?Abdomen: Positive bowel sounds, soft nontender to palpation, nondistended ?Extremities: No clubbing, cyanosis, or edema ? ? ?Musculoskeletal: Patch on L shoulder ?   Cervical back: Neck supple. No tenderness.  ?   Comments: RUE- 4/5 or more- hard to test ?RLE 3+/5 proximally and 4-/5 distally ?LUE- 0/5 ?LLE- 2-/5 in HF and PF only ?  ?Skin: ?   General: Skin is warm and dry.  ?   Comments: Scab 1.5 cm on L forearm- otherwise skin looks OK  ?Neurological:  ?   Mental Status: She is alert and oriented to person, place, and time.  ?   Comments: Severe L inattention/neglect ?Intact to light touch in all 4 extremities ?Dysconjugate gaze, but denies double vision.  ?MAS of 2-3 in LUE_ shoulder MAS of 3; elbow 2-3 and MAS of 2 in L wrist ?MAS 3 L hamstring  ?Puffy L  hand ?Squat and stand pivot modA. Doffing shirt MinA ?Right eye ptosis ?Supine to sit with minA ? ?Psychiatric:     ?   Mood and Affect: Depressed mood ?   Comments: vague  ? ? ?Assessment/Plan: ?1. Functional deficits which require 3+ hours per day of interdisciplinary therapy in a comprehensive inpatient rehab setting. ?Physiatrist is providing close team supervision and 24 hour management of active medical problems listed below. ?Physiatrist and rehab team continue to assess barriers to discharge/monitor patient progress toward functional and medical goals ? ?Care Tool: ? ?Bathing ?   ?Body parts bathed by patient: Front perineal area, Buttocks  ? Body parts bathed by helper: Buttocks, Front perineal area ?  ?  ?Bathing assist Assist Level: Maximal Assistance - Patient 24 - 49% ?  ?  ?Upper Body Dressing/Undressing ?Upper body dressing   ?What is the patient wearing?: Hospital gown only ?   ?Upper body assist Assist Level: Moderate Assistance - Patient 50 - 74% ?   ?Lower Body Dressing/Undressing ?Lower body dressing ? ? ?   ?What is the patient wearing?: Hospital gown only ? ?  ? ?  Lower body assist Assist for lower body dressing: Maximal Assistance - Patient 25 - 49% ?   ? ?Toileting ?Toileting    ?Toileting assist Assist for toileting: Total Assistance - Patient < 25% ?  ?  ?Transfers ?Chair/bed transfer ? ?Transfers assist ?   ? ?Chair/bed transfer assist level: Moderate Assistance - Patient 50 - 74% ?  ?  ?Locomotion ?Ambulation ? ? ?Ambulation assist ? ?   ? ?Assist level: Moderate Assistance - Patient 50 - 74% ?Assistive device: Other (comment) (hall rail) ?Max distance: 30  ? ?Walk 10 feet activity ? ? ?Assist ? Walk 10 feet activity did not occur: Safety/medical concerns ? ?Assist level: Moderate Assistance - Patient - 50 - 74% ?Assistive device: Other (comment) (hall rail)  ? ?Walk 50 feet activity ? ? ?Assist Walk 50 feet with 2 turns activity did not occur: Safety/medical concerns ? ?  ?   ? ? ?Walk  150 feet activity ? ? ?Assist Walk 150 feet activity did not occur: Safety/medical concerns ? ?  ?  ?  ? ?Walk 10 feet on uneven surface  ?activity ? ? ?Assist Walk 10 feet on uneven surfaces activity did not occur: Safety/medical concerns ? ? ?  ?   ? ?Wheelchair ? ? ? ? ?Assist Is the patient using a wheelchair?: Yes ?Type of Wheelchair: Manual ?  ? ?Wheelchair assist level: Dependent - Patient 0% ?Max wheelchair distance: 167ft  ? ? ?Wheelchair 50 feet with 2 turns activity ? ? ? ?Assist ? ?  ?  ? ? ?Assist Level: Dependent - Patient 0%  ? ?Wheelchair 150 feet activity  ? ? ? ?Assist ?   ? ? ?Assist Level: Dependent - Patient 0%  ? ?Blood pressure 129/88, pulse 61, temperature 98.7 ?F (37.1 ?C), resp. rate 18, height 5\' 4"  (1.626 m), weight 67.5 kg, SpO2 98 %. ? ?Medical Problem List and Plan: ?1. 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            -patient may  shower- cover PEG ?            -ELOS/Goals: 14-16 days min A ? HFU scheduled ? Continue CIR- PT, OT and SLP ?2.  Antithrombotics: ?-DVT/anticoagulation:  Pharmaceutical: Other (comment)Eliquis ?            -antiplatelet therapy: none ?3. Shoulder Pain: continue Tylenol prn, Robaxin and Neurotin scheduled, Lidoderm patch.  ?4. Mood: LCSW to evaluate and provide emotional support ?            --continue sertraline 50 mg daily ?            -antipsychotic agents: n/a ?5. Neuropsych: This patient is capable of making decisions on her own behalf. ?6. PEG: discussed removal of PEG tomorrow ?7. Fluids/Electrolytes/Nutrition: Routine Is and Os and follow-up chemistries ?8: Left-sided spasticity: Primarily hamstring decrease baclofen to 10mg  HS. Kpad ordered. Add 5mg  at 2pm. Messaged therapy to provide daughter with stretching exercises ?9: Dysphagia: continue dys 2 thin liquid diet, provided education regarding nutritious D2 foods. Goals modI with least restrictive diet. Recommended smoothies and soups for good nutrition at home, and if  her daughter can bring in here.  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 Increase magnesium gluconate to 500mg  HS. Continue Cozaar 25mg  daily. Increase klor to daily. ?Vitals:  ? 04/19/22 1937 04/20/22 0343  ?BP: 117/73 129/88  ?Pulse: 64 61  ?Resp: 17 18  ?Temp: 98.3 ?F (36.8 ?  C) 98.7 ?F (37.1 ?C)  ?SpO2: 100% 98%  ? ? ?12. Constipation: increase magnesium gluconate to 500mg  HS. Provided with list of high fiber foods. Decrease Baclofen to 10mg  HS ?13. Hypokalemia: increase klor to daily ? ?  Latest Ref Rng & Units 04/16/2022  ? 11:47 PM 04/13/2022  ? 11:34 AM 04/10/2022  ?  5:41 AM  ?BMP  ?Glucose 70 - 99 mg/dL 94   04/15/2022   04/12/2022    ?BUN 6 - 20 mg/dL 7   10   10     ?Creatinine 0.44 - 1.00 mg/dL 678   938      ?Sodium 135 - 145 mmol/L 137   138   137    ?Potassium 3.5 - 5.1 mmol/L 3.5   3.8   3.8    ?Chloride 98 - 111 mmol/L 109   107   108    ?CO2 22 - 32 mmol/L 21   22   21     ?Calcium 8.9 - 10.3 mg/dL 1.01   7.51   0.25    ? ? ?14.  CVA related fatigue, minimize sedating meds  ?15. Left sided inattention, poor visual scanning: continue to encourage turning to left.  ?16. Bradycardia: resolved, continue to monitor HR TID.  ?  ?  ? ?LOS: ?16 days ?A FACE TO FACE EVALUATION WAS PERFORMED ? ? P Tyrel Lex ?04/20/2022, 10:55 AM  ? ?  ?

## 2022-04-20 NOTE — Progress Notes (Signed)
Speech Language Pathology Daily Session Note ? ?Patient Details  ?Name: Sarah Myers ?MRN: [id number] ?Date of Birth: 07-27-1971 ? ?Today's Date: 04/20/2022 ?SLP Individual Time: 0720-0820 ?SLP Individual Time Calculation (min): 60 min ? ?Short Term Goals: ?Week 3: SLP Short Term Goal 1 (Week 3): STG's = LTG's due to ELOS ? ?Skilled Therapeutic Interventions: ?Pt seen this date for skilled ST intervention targeting cognitive-linguistic goals outlined in care plan. Pt encountered awake/alert and sitting upright in bed with breakfast tray present. Daughter at bedside, assisting and encouraging intake. Pt consumed <10% of breakfast despite encouragement and offering multiple options. Appeared less flat and apathetic this session. Agreeable to ST intervention. ? ?SLP facilitated today's session by providing overall Min-Mod verbal/question A for verbally sequencing transfer with [facility], emergent and anticipatory awareness of current deficits and need for assistance, and Mod verbal and visual A for problem-solving during functional transfer task. Given poor intake/appetite with AM meal, SLP provided nutritional supplementation (Glucerna), which pt accepted and verbalized that she liked the taste. Benefited from therapist setting a 3-minute timer to facilitate motor initiation and recall for drinking her nutritional supplementation (Glucerna). Use of timer allowed Min verbal cues to be faded to Sup A. Pt completed functional convergent naming task with ~50% accuracy with Mod I; improved to 100% accuracy given Mod verbal (semantic cues. Named general category for each picture with 100% accuracy with Sup A. Continues to benefit from Min verbal A for visual scanning, locating needed items in room, and visual attention to midline and L field. Faded direct prompts "look L" to question and fill-in the blank to encourage an increase in self-monitoring and problem-solving, which was effective. Pt and daughter educated re: ways to  address cognitive deficits at home during functional tasks; pt's daughter verbalized and demonstrated understanding of recommended cueing via teach back. ? ?Pt left in recliner chair with daughter present, and all immediate needs met. Continue per current ST POC next session. ? ?Pain ?No pain reported this session; NAD ? ?Therapy/Group: Individual Therapy ? ?Deago Burruss A Elena Cothern ?04/20/2022, 12:24 PM ?

## 2022-04-21 DIAGNOSIS — I619 Nontraumatic intracerebral hemorrhage, unspecified: Secondary | ICD-10-CM | POA: Diagnosis not present

## 2022-04-21 DIAGNOSIS — E876 Hypokalemia: Secondary | ICD-10-CM | POA: Diagnosis not present

## 2022-04-21 DIAGNOSIS — R252 Cramp and spasm: Secondary | ICD-10-CM | POA: Diagnosis not present

## 2022-04-21 DIAGNOSIS — I10 Essential (primary) hypertension: Secondary | ICD-10-CM | POA: Diagnosis not present

## 2022-04-21 LAB — GLUCOSE, CAPILLARY
Glucose-Capillary: 109 mg/dL — ABNORMAL HIGH (ref 70–99)
Glucose-Capillary: 126 mg/dL — ABNORMAL HIGH (ref 70–99)
Glucose-Capillary: 104 mg/dL — ABNORMAL HIGH (ref 70–99)
Glucose-Capillary: 92 mg/dL (ref 70–99)

## 2022-04-21 MED ORDER — SILVER NITRATE-POT NITRATE 75-25 % EX MISC
1.0000 "application " | Freq: Every day | CUTANEOUS | Status: DC
  Administered 2022-04-21 – 2022-04-25 (×4): 1 via TOPICAL
  Filled 2022-04-21: qty 1

## 2022-04-21 MED ORDER — CARVEDILOL 12.5 MG PO TABS
12.5000 mg | ORAL_TABLET | Freq: Two times a day (BID) | ORAL | Status: DC
  Administered 2022-04-21 – 2022-04-25 (×8): 12.5 mg via ORAL
  Filled 2022-04-21 (×7): qty 1

## 2022-04-21 NOTE — Progress Notes (Signed)
?   04/10/22 1615  ?Clinical Encounter Type  ?Visited With Patient  ?Visit Type Follow-up;Spiritual support  ?Referral From Chaplain  ?Consult/Referral To Chaplain  ? ?Chaplain Stephanie Acre requested that Lance Muss visit patient who needed encouragement. Patient shared her information about her journey. Patient is concerned about her quality of life and future given her current medical condition. Patient remains hopeful but wants to go  home as she misses her family. Chaplain provided emphatic listening, emotional support, and spiritual prayer.  On 04/11/2022, Chaplain followed-up and found patient to be in a more positive mindset, determined to do the work needed for rehabilitation. ? ?Jon Gills, Resident Chaplain ?([phone]  ?

## 2022-04-21 NOTE — Progress Notes (Signed)
Physical Therapy Session Note ? ?Patient Details  ?Name: Sarah Myers ?MRN: [id number] ?Date of Birth: 1971/10/26 ? ?Today's Date: 04/21/2022 ?PT Individual Time: 1132-1203 and 3845-3646 ?PT Individual Time Calculation (min): 31 min and 80 min ? ?Short Term Goals: ?Week 2:  PT Short Term Goal 1 (Week 2): Pt will complete bed mobility with minA and LRAD ?PT Short Term Goal 1 - Progress (Week 2): Not met ?PT Short Term Goal 2 (Week 2): Pt will complete bed<>chair transfers with minA and LRAD ?PT Short Term Goal 2 - Progress (Week 2): Not met ?PT Short Term Goal 3 (Week 2): Pt will ambulate 32f with maxA and LRAD ?PT Short Term Goal 3 - Progress (Week 2): Not met ?PT Short Term Goal 4 (Week 2): Pt will initiate stair training ?PT Short Term Goal 4 - Progress (Week 2): Not met ?Week 3:  PT Short Term Goal 1 (Week 3): STG = LTG due to ELOS ? ?Skilled Therapeutic Interventions/Progress Updates:  ?Session 1: ?Patient supine in bed on entrance to room. Patient alert and agreeable to PT session. Requesting to toilet. ? ?Patient with no pain complaint throughout session. ? ?Therapeutic Activity: ?Bed Mobility: Patient performed supine --> sit with extensive cueing for technique but able to follow instructions and perform with MinA. Forward scoot with CGA. At end of session, pt requires ModA to bring BLE to bed surface.  ?Transfers: Patient performed sit<>stand transfers throughout session using STEDY with CGA/ MinA with pull-to-stand technique. Provided verbal cues for technique/ effort, and upright posture. Toilet transfer with Min/ ModA. MaxA for pericare. STEDY transfer to w/c for dtr with MinA.  Dtr performs squat pivot transfer w/c to bed. Good technique and setup by dtr. Performs with ModA. =2 for safety from therapist. ? ?Neuromuscular Re-ed: ?NMR facilitated during session with focus on standing balance and muscle activation. Pt guided in minisquat performance in STEDY with pt demonstrating ability to hold L knee  steady in stance. No buckle. Cued for L head turn throughout. NMR performed for improvements in motor control and coordination, balance, sequencing, judgement, and self confidence/ efficacy in performing all aspects of mobility at highest level of independence.  ? ?Patient supine  in bed at end of session with brakes locked, bed alarm set, and all needs within reach. Dtr present.  ? ? ? ?Session 2: ?Patient supine in bed on entrance to room. Patient alert and agreeable to PT session. Dtr has assisted pt in dressing so that pt can leave room.  ? ?Patient with no pain complaint throughout session. At start of session, pt c/o catch in throat. Is able to clear and swallow. However, just prior to ambulation she relates  nausea. Provided with emesis bag and pt vomiting with improved symptoms just after and relating catch in throat is gone. Provided with sips of ginger ale on request.  ? ?Therapeutic Activity: ?Bed Mobility: Patient performed supine --> sit with CGA/ minA. VC/ tc required for technique and effort. Given time to perform. Return to supine at end of session with dtr providing [place] Dtr reminded to allow pt to perform as much as possible by herself.  ?Transfers: Patient performed sit<>stand transfers throughout session using hallway HR and push-to-stand technique from w/c to therapist's arms/ dtrs arms with CGA/ MinA. Reaches upright stance requiring CGA to L knee. Continued positioning requires for LLE. Provided vc/ tc throughout for technique and educated dtr on technique for providing assist in direct sit<>stand. ? ?Gait Training:  ?Df wrap provided to  LLE to decrease toe catch. Patient ambulated 38 ft using R hallway HR with overall ModA for Lle advancement. Demonstrated improved ability to hold self upright and hold L knee steady during stance phase requiring minimal guard. No knee buckle evident. Requires ModA for LLE advancement and positioning as pt continues to demo hip ER and slight flex to knee. But  improved advancement noted from pt. Provided vc/ tc throughout for weight shift, sequencing, technique. Educated on AFO and need for fitting in order to provide correct support prior to d/c for continued gait training with therapy.  ? ?Wheelchair Mobility:  ?Educated dtr on brake application, leg rest donning/ doffing and positioning of wheels for safe propel/ advancement over uneven surfaces including elevator door entry, [place] sliding doorway and pitched concrete patio outside of [place]. ? ?Neuromuscular Re-ed: ?NMR facilitated during session with focus on standing balance and LLE muscle activation. Pt guided in sit<>stand to no AD and only using therapist's arm for balance. Pt and dtr educated on mechanics of standing. Pt able to produce forward lean, push from armrest, and BLE activation for upright stance. ModA provided initially on standing and pt improving to CGA with intermittent MinA for fluctuations in sway with no LOB. Pt demonstrating improved ankle strategy to maintain. Dtr able to demonstrate sit<>stand to dtr's UE for balance. NMR performed for improvements in motor control and coordination, balance, sequencing, judgement, and self confidence/ efficacy in performing all aspects of mobility at highest level of independence.  ? ?Patient supine  in bed at end of session with brakes locked, bed alarm set, and all needs within reach. Dtr in room and undressing pt per her request.  ? ? ?Therapy Documentation ?Precautions:  ?Precautions ?Precautions: Fall ?Precaution Comments: L inattention, PEG, left hemi, right head gaze ?Restrictions ?Weight Bearing Restrictions: No ?General: ?  ?Vital Signs: ? ?Pain: ?Pain Assessment ?Pain Scale: 0-10 ?Pain Score: 0-No pain ? ? ?Therapy/Group: Individual Therapy ? ?Alger Simons ?04/21/2022, 11:32 AM  ?

## 2022-04-21 NOTE — Progress Notes (Signed)
Speech Language Pathology Daily Session Note ? ?Patient Details  ?Name: Sarah Myers ?MRN: [id number] ?Date of Birth: 1971/06/04 ? ?Today's Date: 04/21/2022 ?SLP Individual Time: 1000-1030 ?SLP Individual Time Calculation (min): 30 min ? ?Short Term Goals: ?Week 3: SLP Short Term Goal 1 (Week 3): STG's = LTG's due to ELOS ? ?Skilled Therapeutic Interventions: ?Pt seen this date for skilled ST interventions targeting cognitive-linguistic and dysphagia goals outlined in care plan. Pt encountered lying in bed, asleep. Aroused to voice. Intermittently emotional during today's session, as well as her daughter. Therefore, they requested time to talk and pt missed 30 minutes of scheduled ST intervention.  ? ?SLP facilitated today's session by providing Min faded to Sup A for implementation of safe swallowing strategies. No clinical s/sx concerning for aspiration observed with thin liquid via straw and open top cup nor mixed consistency (fruit cup). Pt oriented to date given Mod verbal A. Pt achieved 100% accuracy on confrontational naming of picture cards given Min-Mod verbal A ; 20% accuracy without prompts likely due to poor visual acuity vs anomia. Scanned to L to place completed card in finished pile with Min A faded to Sup A. Unable to demonstrate recall of date, without Min A, following delay with distraction. Provided validation and reinforcement throughout. Will trial Dysphagia 3 textures in upcoming sessions. ? ?Pt left in bed, bed alarm on, call bell within reach, and all immediate needs met. Daughter at bedside. Continue per current ST POC. ? ?Pain ?No pain reported this session; NAD ? ?Therapy/Group: Individual Therapy ? ?Atanacio Melnyk A Shanta Dorvil ?04/21/2022, 1:13 PM ?

## 2022-04-21 NOTE — Progress Notes (Signed)
Patient ID: Sarah Myers, female   DOB: 04-21-1971, 51 y.o.   MRN: [id number] ? ? ? ? ?Diagnosis codes:i61.9, J96.21 & I26.99 ? ?Height:  5'4            ? ?Weight:    132 lbs   ? ?   ? 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  A rolling walker  will not resolve issue with performing activities of daily living.  A wheelchair will allow patient to safely perform daily activities.  Patient is not able to propel themselves in the home using a standard weight wheelchair due to  hemiparesis .  Patient can self propel in the lightweight wheelchair.  ?Length of need 99 months ?

## 2022-04-21 NOTE — Progress Notes (Signed)
Occupational Therapy Weekly Progress Note ? ?Patient Details  ?Name: Sarah Myers ?MRN: [id number] ?Date of Birth: [birth year] ? ?Beginning of progress report period: April 13, 2022 ?End of progress report period: Apr 21, 2022 ? ?Today's Date: 04/21/2022 ?OT Individual Time: 9379-0240 ?OT Individual Time Calculation (min): 60 min  ? ? ?Patient has met 1 of 4 short term goals.  Pt is making very slow progress and participation has fluctuated this past reporting period.  Pt is mod assist for functional transfers and LB self care.  Min assist required for UB self care.  Pt exhibits persistent right gaze, poor eye alignment, difficulty scanning visually, and left inattention and neglect.  Pt also exhibits moderate flexor tone in LUE and LLE and is unable to use left extremities functionally.  Pts daughter has participated in multiple days of family education with slow carryover of skills demonstrated.  Therapist is recommending to pt and daughter that LB dressing and bathing be completed at bed level to ensure safety.  Daughter has also been educated by OT on assisting pt with LUE gentle prolonged PROM HEP and donning/doffing, splint precautions, and splint wear schedule.  Pt will require hospital bed and bedside commode at discharge. ? ?Patient continues to demonstrate the following deficits: muscle weakness, muscle joint tightness, and muscle paralysis, decreased cardiorespiratoy endurance, impaired timing and sequencing, abnormal tone, unbalanced muscle activation, motor apraxia, decreased coordination, and decreased motor planning, decreased visual acuity, decreased visual perceptual skills, decreased visual motor skills, and field cut, decreased midline orientation, decreased attention to left, left side neglect, and decreased motor planning, decreased initiation, decreased attention, decreased awareness, decreased problem solving, decreased safety awareness, decreased memory, and delayed processing, and decreased  sitting balance, decreased standing balance, decreased postural control, hemiplegia, and decreased balance strategies and therefore will continue to benefit from skilled OT intervention to enhance overall performance with BADL and iADL. ? ?Patient not progressing toward long term goals.  See goal revision in Updated care plan note. ? ?OT Short Term Goals ?Week 2:  OT Short Term Goal 1 (Week 2): Pt will complete stand pivot to [facility] with min assist. ?OT Short Term Goal 1 - Progress (Week 2): Not progressing ?OT Short Term Goal 2 (Week 2): Pt will complete LB dressing with min assist. ?OT Short Term Goal 2 - Progress (Week 2): Not progressing ?OT Short Term Goal 3 (Week 2): Pt will scan and retrieve ADL items in all fields of vision with min multimodal cues. ?OT Short Term Goal 3 - Progress (Week 2): Met ?OT Short Term Goal 4 (Week 2): Pt will complete toileting with min assist. ?OT Short Term Goal 4 - Progress (Week 2): Not progressing ?Week 3:  OT Short Term Goal 1 (Week 3): STGs=LTGs due to ELOS ? ?Skilled Therapeutic Interventions/Progress Updates:  ?  Pt semi reclined in bed, dtr sitting in recliner and pt asking therapist "who are you?".  Reoriented pt to therapist and purpose of skilled OT session.  Pts daughter participated in family education hands on training including assisting pt with UB/LB dressing, bed mobility, sit<>stand, and squat pivot blocked practice EOB<>w/c.  Therapist provided visual demonstration and repetitive instruction of each technique with fair carryover exhibited by daughter needing therapist to frequently step in and provide additional assist to ensure safe completion of each squat pivot and sit<>stand.  Daughter having difficulty with safe hand and foot placement and also providing cues for pt to weight shift anteriorly (daughter continues to lift straight up despite max cueing  and training).  Pt transported to sink to brush teeth with daughter assisting in setting pt up at w/c level.   Pt requesting back to bed.  Educated dtr on assist technique during sit to supine with mod assist.  Direct hand off to nurse. ? ?Therapy Documentation ?Precautions:  ?Precautions ?Precautions: Fall ?Precaution Comments: L inattention, PEG, left hemi, right head gaze ?Restrictions ?Weight Bearing Restrictions: No ? ? ? ?Therapy/Group: Individual Therapy ? ?Caryl Asp Shaquasha Gerstel ?04/21/2022, 8:42 AM  ?

## 2022-04-21 NOTE — Plan of Care (Signed)
?  Problem: RH Tub/Shower Transfers ?Goal: LTG Patient will perform tub/shower transfers w/assist (OT) ?Description: LTG: Patient will perform tub/shower transfers with assist, with/without cues using equipment (OT) ?Outcome: Not Applicable ?Note: Pt politely refuses practicing at shower level due to feeling too cold. ?  ?Problem: RH Eating ?Goal: LTG Patient will perform eating w/assist, cues/equip (OT) ?Description: LTG: Patient will perform eating with assist, with/without cues using equipment (OT) ?Flowsheets (Taken 04/21/2022 [id number]) ?LTG: Pt will perform eating with assistance level of: Supervision/Verbal cueing ?  ?Problem: RH Grooming ?Goal: LTG Patient will perform grooming w/assist,cues/equip (OT) ?Description: LTG: Patient will perform grooming with assist, with/without cues using equipment (OT) ?Flowsheets (Taken 04/21/2022 [id number]) ?LTG: Pt will perform grooming with assistance level of: Minimal Assistance - Patient > 75% ?  ?Problem: RH Bathing ?Goal: LTG Patient will bathe all body parts with assist levels (OT) ?Description: LTG: Patient will bathe all body parts with assist levels (OT) ?Flowsheets (Taken 04/21/2022 [id number]) ?LTG: Pt will perform bathing with assistance level/cueing: Moderate Assistance - Patient 50 - 74% ?LTG: Position pt will perform bathing: At sink ?  ?Problem: RH Dressing ?Goal: LTG Patient will perform lower body dressing w/assist (OT) ?Description: LTG: Patient will perform lower body dressing with assist, with/without cues in positioning using equipment (OT) ?Flowsheets (Taken 04/21/2022 [id number]) ?LTG: Pt will perform lower body dressing with assistance level of: Moderate Assistance - Patient 50 - 74% ?  ?Problem: RH Toileting ?Goal: LTG Patient will perform toileting task (3/3 steps) with assistance level (OT) ?Description: LTG: Patient will perform toileting task (3/3 steps) with assistance level (OT)  ?Flowsheets (Taken 04/21/2022 [id number]) ?LTG: Pt will perform toileting task (3/3 steps) with  assistance level: Moderate Assistance - Patient 50 - 74% ?  ?Problem: RH Toilet Transfers ?Goal: LTG Patient will perform toilet transfers w/assist (OT) ?Description: LTG: Patient will perform toilet transfers with assist, with/without cues using equipment (OT) ?Flowsheets (Taken 04/21/2022 [id number]) ?LTG: Pt will perform toilet transfers with assistance level of: Moderate Assistance - Patient 50 - 74% ?  ?

## 2022-04-21 NOTE — Progress Notes (Signed)
Patient ID: Sarah Myers, female   DOB: Mar 30, 1971, 51 y.o.   MRN: [id number] ? ? ?Diagnosis code:I61.9, J96.21 & J96.21 ? ?Height: 5'4 ? ?Weight:132 lbs ? ? ?Patient has hemiparesis and swallowing issues which requires the bed to be positioned in ways not feasible with a normal bed.  Head must be elevated at least 30 degrees .   Due to her stroke she requires frequent and immediate changes in body position which cannot be achieved with a normal bed. Length of need 9-12 months ?

## 2022-04-21 NOTE — Progress Notes (Signed)
Patient ID: Sarah Myers, female   DOB: [birth year], 51 y.o.   MRN: [id number] Equipment ordered via [employer] contracted by [employer]. Will call daughter-Breosha to set up delivery. ?

## 2022-04-21 NOTE — Progress Notes (Signed)
?   04/21/22 1515  ?Clinical Encounter Type  ?Visited With Patient and family together  ?Visit Type Initial;Spiritual support  ?Referral From Nurse  ?Consult/Referral To Chaplain  ? ?Chaplain spent time with patient listening as she shared her time in the hospital and her deep longing to go home. The patient, Sarah Myers, expressed her frustration over staring at the walls of her room these many months. She states that in her mind she keeps seeing a darkness and she badly wants that cloud to be lifted. [name] is fighting to not give up but is tired of the fight. She understands her therapy is almost done but until she is home she finds it hard to believe that it will ever happen.  ?I did pray with her and I encouraged her to hold on, but I also acknowledged that it has been a long time that she has been holding on to hope.  ? ?Valerie Roys ?Chaplain  ?[facility]  ?[phone] ?

## 2022-04-21 NOTE — Progress Notes (Signed)
?                                                       PROGRESS NOTE ? ? ?Subjective/Complaints: ?PEG removed today without complication. Silver nitrate ordered for keloid. Advised that little fluid will drain today ?She is feeling a little sleepy this morning ? ?ROS:  ? ?Pt denies SOB, abd pain, CP, N/V/C/D, and vision changes, + pain in hand intermittently, shoulder pain resolved, +left sided pain from spasticity, +daytime somnolence- improved, AFO is uncomfortable for her, +low mood ? ?Objective: ?  ?No results found. ?No results for input(s): WBC, HGB, HCT, PLT in the last 72 hours. ? ? ? ?No results for input(s): NA, K, CL, CO2, GLUCOSE, BUN, CREATININE, CALCIUM in the last 72 hours. ? ? ? ?Intake/Output Summary (Last 24 hours) at 04/21/2022 1040 ?Last data filed at 04/21/2022 0831 ?Gross per 24 hour  ?Intake 240 ml  ?Output --  ?Net 240 ml  ? ?  ? ?  ? ?Physical Exam: ?Vital Signs ?Blood pressure 117/70, pulse (!) 57, temperature 98.5 ?F (36.9 ?C), resp. rate 18, height 5\' 4"  (1.626 m), weight 68.4 kg, SpO2 98 %. ? ?General: No acute distress, BMI 25.54 ?Mood and affect are appropriate ?Heart: Bradycardia ?Lungs: Clear to auscultation, breathing unlabored, no rales or wheezes ?Abdomen: Positive bowel sounds, soft nontender to palpation, nondistended ?Extremities: No clubbing, cyanosis, or edema ? ? ?Musculoskeletal: Patch on L shoulder ?   Cervical back: Neck supple. No tenderness.  ?   Comments: RUE- 4/5 or more- hard to test ?RLE 3+/5 proximally and 4-/5 distally ?LUE- 0/5 ?LLE- 2-/5 in HF and PF only ?  ?Skin: ?   General: Skin is warm and dry.  ?   Comments: Scab 1.5 cm on L forearm- otherwise skin looks OK  ?Neurological:  ?   Mental Status: She is alert and oriented to person, place, and time.  ?   Comments: Severe L inattention/neglect ?Intact to light touch in all 4 extremities ?Dysconjugate gaze, but denies double vision.  ?MAS of 2-3 in LUE_ shoulder MAS of 3; elbow 2-3 and MAS of 2 in L wrist ?MAS 3 L  hamstring  ?Puffy L hand ?Squat and stand pivot modA. Doffing shirt MinA ?Right eye ptosis ?Supine to sit with minA ? ?Psychiatric:     ?   Mood and Affect: Depressed mood ?   Comments: vague  ? ? ?Assessment/Plan: ?1. Functional deficits which require 3+ hours per day of interdisciplinary therapy in a comprehensive inpatient rehab setting. ?Physiatrist is providing close team supervision and 24 hour management of active medical problems listed below. ?Physiatrist and rehab team continue to assess barriers to discharge/monitor patient progress toward functional and medical goals ? ?Care Tool: ? ?Bathing ?   ?Body parts bathed by patient: Front perineal area, Buttocks  ? Body parts bathed by helper: Buttocks, Front perineal area ?  ?  ?Bathing assist Assist Level: Maximal Assistance - Patient 24 - 49% ?  ?  ?Upper Body Dressing/Undressing ?Upper body dressing   ?What is the patient wearing?: Hospital gown only ?   ?Upper body assist Assist Level: Moderate Assistance - Patient 50 - 74% ?   ?Lower Body Dressing/Undressing ?Lower body dressing ? ? ?   ?What is the patient wearing?: Hospital gown only ? ?  ? ?  Lower body assist Assist for lower body dressing: Maximal Assistance - Patient 25 - 49% ?   ? ?Toileting ?Toileting    ?Toileting assist Assist for toileting: Total Assistance - Patient < 25% ?  ?  ?Transfers ?Chair/bed transfer ? ?Transfers assist ?   ? ?Chair/bed transfer assist level: Moderate Assistance - Patient 50 - 74% ?  ?  ?Locomotion ?Ambulation ? ? ?Ambulation assist ? ?   ? ?Assist level: Moderate Assistance - Patient 50 - 74% ?Assistive device: Other (comment) (hall rail) ?Max distance: 30  ? ?Walk 10 feet activity ? ? ?Assist ? Walk 10 feet activity did not occur: Safety/medical concerns ? ?Assist level: Moderate Assistance - Patient - 50 - 74% ?Assistive device: Other (comment) (hall rail)  ? ?Walk 50 feet activity ? ? ?Assist Walk 50 feet with 2 turns activity did not occur: Safety/medical  concerns ? ?  ?   ? ? ?Walk 150 feet activity ? ? ?Assist Walk 150 feet activity did not occur: Safety/medical concerns ? ?  ?  ?  ? ?Walk 10 feet on uneven surface  ?activity ? ? ?Assist Walk 10 feet on uneven surfaces activity did not occur: Safety/medical concerns ? ? ?  ?   ? ?Wheelchair ? ? ? ? ?Assist Is the patient using a wheelchair?: Yes ?Type of Wheelchair: Manual ?  ? ?Wheelchair assist level: Dependent - Patient 0% ?Max wheelchair distance: 164ft  ? ? ?Wheelchair 50 feet with 2 turns activity ? ? ? ?Assist ? ?  ?  ? ? ?Assist Level: Dependent - Patient 0%  ? ?Wheelchair 150 feet activity  ? ? ? ?Assist ?   ? ? ?Assist Level: Dependent - Patient 0%  ? ?Blood pressure 117/70, pulse (!) 57, temperature 98.5 ?F (36.9 ?C), resp. rate 18, height 5\' 4"  (1.626 m), weight 68.4 kg, SpO2 98 %. ? ?Medical Problem List and Plan: ?1. 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            -patient may  shower- cover PEG ?            -ELOS/Goals: 14-16 days min A ? HFU scheduled ? Continue CIR- PT, OT and SLP ?2.  Antithrombotics: ?-DVT/anticoagulation:  Pharmaceutical: Other (comment)Eliquis ?            -antiplatelet therapy: none ?3. Shoulder Pain: continue Tylenol prn, Robaxin and Neurotin scheduled, Lidoderm patch.  ?4. Mood: LCSW to evaluate and provide emotional support ?            --continue sertraline 50 mg daily ?            -antipsychotic agents: n/a ?5. Neuropsych: This patient is capable of making decisions on her own behalf. ?6. S/p PEG: PEG removed 5/5 without complication.  ?7. Fluids/Electrolytes/Nutrition: Routine Is and Os and follow-up chemistries ?8: Left-sided spasticity: Primarily hamstring decrease baclofen to 10mg  HS. Kpad ordered. Add 5mg  at 2pm. Messaged therapy to provide daughter with stretching exercises. Discussed Botox outpatient.  ?9: Dysphagia: continue dys 2 thin liquid diet, provided education regarding nutritious D2 foods. Goals modI with least restrictive  diet. Recommended smoothies and soups for good nutrition at home, and if her daughter can bring in here.  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 Increase magnesium gluconate to 500mg  HS. Continue Cozaar 25mg  daily. Increase klor to daily. ?Vitals:  ? 04/20/22 1936 04/21/22 0356  ?BP: 116/69 117/70  ?Pulse: (!) 55 (!) 57  ?  Resp: 18 18  ?Temp: 98.2 ?F (36.8 ?C) 98.5 ?F (36.9 ?C)  ?SpO2: 98% 98%  ? ? ?12. Constipation: increase magnesium gluconate to 500mg  HS. Provided with list of high fiber foods. Decrease Baclofen to 10mg  HS ?13. Hypokalemia: increase klor to daily ? ?  Latest Ref Rng & Units 04/16/2022  ? 11:47 PM 04/13/2022  ? 11:34 AM 04/10/2022  ?  5:41 AM  ?BMP  ?Glucose 70 - 99 mg/dL 94   04/15/2022   04/12/2022    ?BUN 6 - 20 mg/dL 7   10   10     ?Creatinine 0.44 - 1.00 mg/dL 676   720      ?Sodium 135 - 145 mmol/L 137   138   137    ?Potassium 3.5 - 5.1 mmol/L 3.5   3.8   3.8    ?Chloride 98 - 111 mmol/L 109   107   108    ?CO2 22 - 32 mmol/L 21   22   21     ?Calcium 8.9 - 10.3 mg/dL 9.47   0.96   2.83    ? ? ?14.  CVA related fatigue, minimize sedating meds  ?15. Left sided inattention, poor visual scanning: continue to encourage turning to left.  ?16. Bradycardia: decrease Coreg to 12.5mg  BID, continue to monitor HR TID.  ?  ?  ? ?LOS: ?17 days ?A FACE TO FACE EVALUATION WAS PERFORMED ? ? P Beckhem Isadore ?04/21/2022, 10:40 AM  ? ?  ?

## 2022-04-22 DIAGNOSIS — R1312 Dysphagia, oropharyngeal phase: Secondary | ICD-10-CM | POA: Diagnosis not present

## 2022-04-22 DIAGNOSIS — F329 Major depressive disorder, single episode, unspecified: Secondary | ICD-10-CM

## 2022-04-22 DIAGNOSIS — E876 Hypokalemia: Secondary | ICD-10-CM | POA: Diagnosis not present

## 2022-04-22 DIAGNOSIS — I610 Nontraumatic intracerebral hemorrhage in hemisphere, subcortical: Secondary | ICD-10-CM | POA: Diagnosis not present

## 2022-04-22 LAB — GLUCOSE, CAPILLARY
Glucose-Capillary: 104 mg/dL — ABNORMAL HIGH (ref 70–99)
Glucose-Capillary: 122 mg/dL — ABNORMAL HIGH (ref 70–99)
Glucose-Capillary: 106 mg/dL — ABNORMAL HIGH (ref 70–99)
Glucose-Capillary: 110 mg/dL — ABNORMAL HIGH (ref 70–99)

## 2022-04-22 NOTE — Progress Notes (Signed)
?                                                       PROGRESS NOTE ? ? ?Subjective/Complaints: ?Up in bed. Abdomen still a little sore.  ? ?ROS: Limited due to cognitive/behavioral  ? ?Objective: ?  ?No results found. ?No results for input(s): WBC, HGB, HCT, PLT in the last 72 hours. ? ? ? ?No results for input(s): NA, K, CL, CO2, GLUCOSE, BUN, CREATININE, CALCIUM in the last 72 hours. ? ? ? ?Intake/Output Summary (Last 24 hours) at 04/22/2022 0826 ?Last data filed at 04/21/2022 1857 ?Gross per 24 hour  ?Intake 240 ml  ?Output --  ?Net 240 ml  ? ?  ? ?  ? ?Physical Exam: ?Vital Signs ?Blood pressure 114/67, pulse 64, temperature 98.5 ?F (36.9 ?C), temperature source Oral, resp. rate 16, height 5\' 4"  (1.626 m), weight 68.4 kg, SpO2 97 %. ? ?Constitutional: No distress . Vital signs reviewed. ?HEENT: NCAT, EOMI, oral membranes moist ?Neck: supple ?Cardiovascular: brady without murmur. No JVD    ?Respiratory/Chest: CTA Bilaterally without wheezes or rales. Normal effort    ?GI/Abdomen: BS +, sl tender. Peg site with mild drainage, granulation ?Ext: no clubbing, cyanosis, or edema ?Psych: flat ?Musculoskeletal: Patch on L shoulder ?   Cervical back: Neck supple. No tenderness.  ?   Comments: RUE- 4/5 or more- hard to test ?RLE 3+/5 proximally and 4-/5 distally ?LUE- 0/5 ?LLE- 2-/5 in HF and PF only ?  ?Skin: ?   General: Skin is warm and dry.  ?   Comments: Scab 1.5 cm on L forearm-    ?Neurological:  ?   Mental Status: She is alert and oriented to person, place, and time.  ?   Comments: L inattention ?Intact to light touch in all 4 extremities ?Dysconjugate gaze, but denies double vision.  ?MAS of 2-3 in LUE_ shoulder MAS of 3; elbow 2-3 and MAS of 2 in L wrist ?MAS 3 L hamstring  ?Puffy L hand ?  ? ? ? ? ?Assessment/Plan: ?1. Functional deficits which require 3+ hours per day of interdisciplinary therapy in a comprehensive inpatient rehab setting. ?Physiatrist is providing close team supervision and 24 hour management  of active medical problems listed below. ?Physiatrist and rehab team continue to assess barriers to discharge/monitor patient progress toward functional and medical goals ? ?Care Tool: ? ?Bathing ?   ?Body parts bathed by patient: Front perineal area, Buttocks  ? Body parts bathed by helper: Buttocks, Front perineal area ?  ?  ?Bathing assist Assist Level: Maximal Assistance - Patient 24 - 49% ?  ?  ?Upper Body Dressing/Undressing ?Upper body dressing   ?What is the patient wearing?: Hospital gown only ?   ?Upper body assist Assist Level: Moderate Assistance - Patient 50 - 74% ?   ?Lower Body Dressing/Undressing ?Lower body dressing ? ? ?   ?What is the patient wearing?: Hospital gown only ? ?  ? ?Lower body assist Assist for lower body dressing: Maximal Assistance - Patient 25 - 49% ?   ? ?Toileting ?Toileting    ?Toileting assist Assist for toileting: Total Assistance - Patient < 25% ?  ?  ?Transfers ?Chair/bed transfer ? ?Transfers assist ?   ? ?Chair/bed transfer assist level: Moderate Assistance - Patient 50 - 74% ?  ?  ?  Locomotion ?Ambulation ? ? ?Ambulation assist ? ?   ? ?Assist level: Moderate Assistance - Patient 50 - 74% ?Assistive device: Other (comment) (hall rail) ?Max distance: 30  ? ?Walk 10 feet activity ? ? ?Assist ? Walk 10 feet activity did not occur: Safety/medical concerns ? ?Assist level: Moderate Assistance - Patient - 50 - 74% ?Assistive device: Other (comment) (hall rail)  ? ?Walk 50 feet activity ? ? ?Assist Walk 50 feet with 2 turns activity did not occur: Safety/medical concerns ? ?  ?   ? ? ?Walk 150 feet activity ? ? ?Assist Walk 150 feet activity did not occur: Safety/medical concerns ? ?  ?  ?  ? ?Walk 10 feet on uneven surface  ?activity ? ? ?Assist Walk 10 feet on uneven surfaces activity did not occur: Safety/medical concerns ? ? ?  ?   ? ?Wheelchair ? ? ? ? ?Assist Is the patient using a wheelchair?: Yes ?Type of Wheelchair: Manual ?  ? ?Wheelchair assist level: Dependent -  Patient 0% ?Max wheelchair distance: 180ft  ? ? ?Wheelchair 50 feet with 2 turns activity ? ? ? ?Assist ? ?  ?  ? ? ?Assist Level: Dependent - Patient 0%  ? ?Wheelchair 150 feet activity  ? ? ? ?Assist ?   ? ? ?Assist Level: Dependent - Patient 0%  ? ?Blood pressure 114/67, pulse 64, temperature 98.5 ?F (36.9 ?C), temperature source Oral, resp. rate 16, height 5\' 4"  (1.626 m), weight 68.4 kg, SpO2 97 %. ? ?Medical Problem List and Plan: ?1. 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            -patient may  shower- cover PEG ?            -ELOS/Goals: 14-16 days min A ? HFU scheduled ? Continue CIR- PT, OT and SLP ?2.  Antithrombotics: ?-DVT/anticoagulation:  Pharmaceutical: Other (comment)Eliquis ?            -antiplatelet therapy: none ?3. Shoulder Pain: continue Tylenol prn, Robaxin and Neurotin scheduled, Lidoderm patch.  ?4. Mood: team  providing emotional support, appreciate chaplain ?            --continue sertraline 50 mg daily ?            -antipsychotic agents: n/a ?5. Neuropsych: This patient is capable of making decisions on her own behalf. ?6. S/p PEG: PEG removed 5/5 without complication.  ? -continue abd dressing 5/6 ?7. Fluids/Electrolytes/Nutrition: Routine Is and Os and follow-up chemistries ?8: Left-sided spasticity: Primarily hamstring decrease baclofen to 10mg  HS. Kpad ordered. Add 5mg  at 2pm. Messaged therapy to provide daughter with stretching exercises. Discussed Botox outpatient.  ?9: Dysphagia: continue dys 2 thin liquid diet, provided education regarding nutritious D2 foods. Goals modI with least restrictive diet. Recommended smoothies and soups for good nutrition at home, and if her daughter can bring in here.  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 Increase magnesium gluconate to 500mg  HS. Continue Cozaar 25mg  daily. Increase klor to daily. ?Vitals:  ? 04/21/22 1343 04/22/22 0505  ?BP:  116/72 114/67  ?Pulse: 62 64  ?Resp:  16  ?Temp: 97.6 ?F (36.4 ?C) 98.5 ?F (36.9 ?C)  ?SpO2: 99% 97%  ? ? ?12. Constipation: increased magnesium gluconate to 500mg  HS. Provided with list of high fiber foods. Decrease Baclofen to 10mg  HS ?13. Hypokalemia: increase klor to daily ? ?  Latest Ref Rng & Units 04/16/2022  ?  11:47 PM 04/13/2022  ? 11:34 AM 04/10/2022  ?  5:41 AM  ?BMP  ?Glucose 70 - 99 mg/dL 94   967   591    ?BUN 6 - 20 mg/dL 7   10   10     ?Creatinine 0.44 - 1.00 mg/dL   6.38   4.66    ?Sodium 135 - 145 mmol/L 137   138   137    ?Potassium 3.5 - 5.1 mmol/L 3.5   3.8   3.8    ?Chloride 98 - 111 mmol/L 109   107   108    ?CO2 22 - 32 mmol/L 21   22   21     ?Calcium 8.9 - 10.3 mg/dL 5.99     35.7    ? ? ?14.  CVA related fatigue, minimize sedating meds  ?15. Left sided inattention, poor visual scanning: continue to encourage turning to left.  ?16. Bradycardia: decrease Coreg to 12.5mg  BID, continue to monitor HR TID.  ?  5/6 HR IN 60's today ?  ? ?LOS: ?18 days ?A FACE TO FACE EVALUATION WAS PERFORMED ? ?01.7 ?04/22/2022, 8:26 AM  ? ?  ?

## 2022-04-22 NOTE — Progress Notes (Signed)
Occupational Therapy Session Note ? ?Patient Details  ?Name: Sarah Myers ?MRN: [id number] ?Date of Birth: 02/22/1971 ? ?Today's Date: 04/22/2022 ?OT Individual Time: 0800-0900 ?OT Individual Time Calculation (min): 60 min  ? ? ?Short Term Goals: ?Week 1:  OT Short Term Goal 1 (Week 1): Pt will complete UB dressing with min assist ?OT Short Term Goal 1 - Progress (Week 1): Met ?OT Short Term Goal 2 (Week 1): Pt will complete squat pivot BSC transfer with mod assist ?OT Short Term Goal 2 - Progress (Week 1): Met ?OT Short Term Goal 3 (Week 1): Pt will complete LB dressing with mod assist ?OT Short Term Goal 3 - Progress (Week 1): Met ?OT Short Term Goal 4 (Week 1): Pt will complete oral hygiene with setup and min VCs for visual scanning and item retrieval. ?OT Short Term Goal 4 - Progress (Week 1): Met ? ?Skilled Therapeutic Interventions/Progress Updates: Dtr was present for therapy session and agreeed to participate in patient\family education as she is the dtr who will be home helping [name] upon d/c.    Dtr completed ADL education bed level with [name] for lower body as was suggested for safety by primary OT.    [name] completed lower body bathing and dressing with head of bed elevated with maximal to near total assistance.  Dtr assisted mom with minimal assistance for correction and technique help increase mom's indepdence.     Patient left LE very toneous and stiff without knee flexion.  As patient attempted to wash mobilize and dress left upper extremity, she required moderate assistance to maintain trunk stability to right herself as she lost balance. Patient scanned environment with moderate verbal cues as reminders.  Patient completed upper body dressing sitting upright in bed with total assistance.   He lacked balance and trunk stability to independently complete the task.   She did request to complete the upper body bathing and dressing in bed due to c/o fatigue.  At the end of  the session, she requested to stay in bed to rest.   She was left supine in bed with alarm in place with dtr present.  Speech therapy came in the work with patient at end of OT session. ?   ? ?Therapy Documentation ?Precautions:  ?Precautions ?Precautions: Fall ?Precaution Comments: L inattention, PEG, left hemi, right head gaze ?Restrictions ?Weight Bearing Restrictions: No ? ?  ?Pain: ?Pain Assessment ?Pain Score: 0-No pain ? ? ? ?Therapy/Group: Individual Therapy ? ?Herschell Dimes ?04/22/2022, 3:29 PM ?

## 2022-04-22 NOTE — Progress Notes (Signed)
Speech Language Pathology Daily Session Note ? ?Patient Details  ?Name: Sarah Myers ?MRN: [id number] ?Date of Birth: Jul 08, 1971 ? ?Today's Date: 04/22/2022 ?SLP Individual Time: 7482-7078 ?SLP Individual Time Calculation (min): 44 min ? ?Short Term Goals: ?Week 3: SLP Short Term Goal 1 (Week 3): STG's = LTG's due to ELOS ? ?Skilled Therapeutic Interventions:Skilled ST services focused on education and swallow skills. Pt's daughter present for treatment session. SLP facilitated PO consumption of dys 3 texture trials and thin liquids via straw. Pt required extra time for bolus preparation with no oral residue and supervision A for use of liquid wash. SLP provided education to pt and pt's daughter pertaining to PO intake positioning, fatigue, alertness, use of liquid wash and prioritize nutrition due to limited PO intake. Pt's daughter asked appropriate questions about diet and meal preparation, SLP provided handout and answered all questions to satisfaction. SLP upgrade back to dys 3 textures and full supervision A. Pt was left in room with call bell within reach and bed alarm set. SLP recommends to continue skilled services. ?   ? ?Pain ?Pain Assessment ?Pain Score: 0-No pain ? ?Therapy/Group: Individual Therapy ? ?[name] ?04/22/2022, 12:47 PM ?

## 2022-04-22 NOTE — Progress Notes (Signed)
?   04/22/22 1640  ?Clinical Encounter Type  ?Visited With Patient  ?Visit Type Follow-up;Spiritual support  ?Referral From Nurse  ?Consult/Referral To Chaplain  ? ?Chaplain responded to a spiritual consult request for prayer. The patient, Sarah Myers, was quiet when I arrived her daughter had just left and she was sad about that. I provided a prayer shawl that someone had provided to [place]. Lemeke was very appreciative of the shawl and the prayers that were on the enclosed note. She is trying her best to hold on till Wednesday when she expects to be discharged. She shared with me today about her grandchildren and how much she loves them. I departed because it was time for tests to be done and dinner would be here soon.  ? ?Valerie Roys ?Chaplain  ?[facility]  ?[phone] ? ? ?

## 2022-04-22 NOTE — Progress Notes (Signed)
Physical Therapy Session Note ? ?Patient Details  ?Name: Sarah Myers ?MRN: [id number] ?Date of Birth: Mar 03, 1971 ? ?Today's Date: 04/22/2022 ?PT Individual Time: 5465-0354 ?PT Individual Time Calculation (min): 53 min  ? ?Short Term Goals: ?Week 3:  PT Short Term Goal 1 (Week 3): STG = LTG due to ELOS ? ?Skilled Therapeutic Interventions/Progress Updates:  ?Chart reviewed and pt agreeable to therapy. Pt received semi-reclined in bed with no c/o pain. Also of note, daughter was present t/o session. Session focused on transfer practice for home and initiation of sit to stand transfer in RW to support ambulation training. Pt initiated session with transfer to EOB using ModA + bed features to support L seated pivot. PT attempted to donn L AFO, but shoe too small. Pt then completed attempt to stand in RW with L hand placed, but c/o pain. Pt and daughter practiced squat transfer who required min VC for correct WC placement and cueing for posterior scooting. Pt then transferred outside for therapy per request of daughter. Outside, pt practiced 6x sit to stand with MinA progressing to CGA + RW. Pt required VC for technique including scooting to edge of chair, foot placement, and forward leaning. Pt noted to need momentary MinA on L side for balance during stand>sit. Session education emphasized role of PT in home. At end of session, pt was left seated in [facility] with alarm engaged, nurse call bell and all needs in reach. ? ?  ? ?Therapy Documentation ?Precautions:  ?Precautions ?Precautions: Fall ?Precaution Comments: L inattention, PEG, left hemi, right head gaze ?Restrictions ?Weight Bearing Restrictions: No ? ?Pain: ?Pain Assessment ?Pain Score: 0-No pain ? ? ? ?Therapy/Group: Individual Therapy ? ?Dionne Milo, PT, DPT ?04/22/2022, 2:21 PM  ?

## 2022-04-23 DIAGNOSIS — R1312 Dysphagia, oropharyngeal phase: Secondary | ICD-10-CM | POA: Diagnosis not present

## 2022-04-23 DIAGNOSIS — F329 Major depressive disorder, single episode, unspecified: Secondary | ICD-10-CM | POA: Diagnosis not present

## 2022-04-23 DIAGNOSIS — E876 Hypokalemia: Secondary | ICD-10-CM | POA: Diagnosis not present

## 2022-04-23 DIAGNOSIS — I610 Nontraumatic intracerebral hemorrhage in hemisphere, subcortical: Secondary | ICD-10-CM | POA: Diagnosis not present

## 2022-04-23 LAB — GLUCOSE, CAPILLARY
Glucose-Capillary: 98 mg/dL (ref 70–99)
Glucose-Capillary: 109 mg/dL — ABNORMAL HIGH (ref 70–99)
Glucose-Capillary: 121 mg/dL — ABNORMAL HIGH (ref 70–99)
Glucose-Capillary: 116 mg/dL — ABNORMAL HIGH (ref 70–99)

## 2022-04-23 NOTE — Progress Notes (Signed)
1100- Tech in room with pt called out for assistance. Upon entering the room pt is sitting on floor beside of bed. Pt was able to stand with assistance and transferred back to bed. No complaints of pain, no injuries noted. Pt/staff state that pts head did hit the side of recliner in room. Assessment of the pts head WNL. Notified Dr. Riley Kill. Neuro checks WNL.  ? ?1200-Bre[place] returned call, this nurse notified her of fall.  ? 04/23/22 1115  ?What Happened  ?Was fall witnessed? Yes  ?Who witnessed fall? Destiny, tech  ?Patients activity before fall bathroom-assisted  ?Point of contact buttocks  ?Was patient injured? No  ?Follow Up  ?MD notified Dr. Riley Kill  ?Time MD notified 1105  ?Family notified Yes - comment ?([name] notified, attempted to call [place], but no answer.)  ?Time family notified 1110  ?Additional tests No  ?Simple treatment Other (comment) ?(No c/o pain, VS WNL, neuro checks)  ?Progress note created (see row info) Yes  ?Adult Fall Risk Assessment  ?Risk Factor Category (scoring not indicated) Fall has occurred during this admission (document High fall risk)  ?Age 51  ?Fall History: Fall within 6 months prior to admission 5  ?Elimination; Bowel and/or Urine Incontinence 2  ?Elimination; Bowel and/or Urine Urgency/Frequency 0  ?Medications: includes PCA/Opiates, Anti-convulsants, Anti-hypertensives, Diuretics, Hypnotics, Laxatives, Sedatives, and Psychotropics 3  ?Patient Care Equipment 1  ?Mobility-Assistance 2  ?Mobility-Gait 2  ?Mobility-Sensory Deficit 2  ?Altered awareness of immediate physical environment 0  ?Impulsiveness 0  ?Lack of understanding of one's physical/cognitive limitations 0  ?Total Score 17  ?Patient Fall Risk Level High fall risk  ?Adult Fall Risk Interventions  ?Required Bundle Interventions *See Row Information* High fall risk - low, moderate, and high requirements implemented  ?Additional Interventions Use of appropriate toileting equipment (bedpan, BSC, etc.)  ?Screening for Fall  Injury Risk (To be completed on HIGH fall risk patients) - Assessing Need for Floor Mats  ?Risk For Fall Injury- Criteria for Floor Mats Confusion/dementia (+NuDESC, CIWA, TBI, etc.)  ?Will Implement Floor Mats Yes  ?Vitals  ?BP 135/86  ?BP Location Right Arm  ?BP Method Automatic  ?Patient Position (if appropriate) Lying  ?Pulse Rate 67  ?Oxygen Therapy  ?SpO2 99 %  ?O2 Device Room Air  ?Pain Assessment  ?Pain Scale 0-10  ?Pain Score 0  ?PCA/Epidural/Spinal Assessment  ?Respiratory Pattern Regular;Unlabored  ?Neurological  ?Neuro (WDL) X  ?Level of Consciousness Alert  ?Orientation Level Oriented to person;Oriented to place;Oriented to situation  ?Cognition Follows commands  ?Speech Clear  ?R Pupil Size (mm) 3  ?R Pupil Shape Round  ?R Pupil Reaction Brisk  ?L Pupil Size (mm) 3  ?L Pupil Shape Round  ?L Pupil Reaction Brisk  ?R Hand Grip Moderate  ?L Hand Grip Absent  ?RUE Motor Response Purposeful movement  ?RUE Sensation Full sensation  ?LUE Motor Response Non-purposeful movement  ?LUE Sensation Decreased  ?RLE Motor Response Purposeful movement  ?RLE Sensation Full sensation  ?LLE Motor Response Non-purposeful movement  ?LLE Sensation Decreased  ?Neuro Symptoms Forgetful  ?Gl[place] Coma Scale  ?Eye Opening 4  ?Best Verbal Response (NON-intubated) 5  ?Best Motor Response 6  ?[place] Coma Scale Score 15  ?Musculoskeletal  ?Musculoskeletal (WDL) X  ?Assistive Device [place];Wheelchair  ?Generalized Weakness Yes  ?Weight Bearing Restrictions No  ?Musculoskeletal Details  ?LUE Paralysis  ?LLE Ortho/Supportive Device  ?LLE Ortho/Supportive Device AFO  ?Left Fingers Contracture  ?Integumentary  ?Integumentary (WDL) X  ?Skin Color Appropriate for ethnicity  ?Skin Condition  Dry  ?Skin Integrity Abrasion  ?Abrasion Location Arm  ?Abrasion Location Orientation Left  ?Abrasion Intervention Foam  ?Skin Turgor Non-tenting  ? ?

## 2022-04-23 NOTE — Progress Notes (Signed)
Occupational Therapy Session Note ? ?Patient Details  ?Name: Sarah Myers ?MRN: [id number] ?Date of Birth: May 24, 1971 ? ?Today's Date: 04/23/2022 ?OT Individual Time: 4098-1191 ?OT Individual Time Calculation (min): 45 min  ? ? ?Short Term Goals: ? ?Week 3:  OT Short Term Goal 1 (Week 3): STGs=LTGs due to ELOS ? ?Skilled Therapeutic Interventions/Progress Updates:  ?  Subjective: Pt states she wants to take a shower. No c/o pain.   ?  Objective:  Pt semi reclined in bed. Supine to sit with HOB elevated with min assist.  Stand pivot EOB to w/c with mod assist.  Transported to bathroom via w/c, stand pivot to shower bench at walk in shower with mod assist.  Mod assist overall to doff shirt pants and brief at sit<>stand level. Step by step Vcs needed to implement hemi strategies. Bathed UB and LB with min assist using lateral leans for buttocks and using LHS. Stand pivot to w/c with mod assist then transported to sink via w/c where pt donned clean shirt with min assist.  She then donn brief and pants at sit<>stand level with max assist.  Brushed teeth seated with supervision today.  Returned to recliner via stand pivot with mod assist.  Call bell in reach, LUE supported with pillows, BLE elevated, seat alarm on. Pt needed frequent Vcs to facilitate left attention and left head turn. Call bell in reach, seat alarm on.   ?  Assessment:  Pt making progress evidenced by improved independence during oral hygiene with increased self initiation of scanning sink for ADL items.  Primary barriers today included memory impairment hindering pt's carryover of hemi strategy skills from one session to the next.  Pt also limited by posterior bias in standing and transfers. ?  Plan: Pt would benefit from further training on neuro re-ed to encourage forward weight shifting during sit<>stands and stand pivots.    ? ?Therapy Documentation ?Precautions:  ?Precautions ?Precautions: Fall ?Precaution Comments: L inattention, PEG, left hemi,  right head gaze ?Restrictions ?Weight Bearing Restrictions: No ? ? ? ?Therapy/Group: Individual Therapy ? ?Sarah Myers ?04/23/2022, 12:11 PM ?

## 2022-04-23 NOTE — Progress Notes (Signed)
?                                                       PROGRESS NOTE ? ? ?Subjective/Complaints: ?Pt sitting up in bed. Needs to use bathroom. Denied pain. Slept ? ?ROS: Limited due to cognitive/behavioral  ? ?Objective: ?  ?No results found. ?No results for input(s): WBC, HGB, HCT, PLT in the last 72 hours. ? ? ? ?No results for input(s): NA, K, CL, CO2, GLUCOSE, BUN, CREATININE, CALCIUM in the last 72 hours. ? ? ? ?Intake/Output Summary (Last 24 hours) at 04/23/2022 0817 ?Last data filed at 04/22/2022 1827 ?Gross per 24 hour  ?Intake 420 ml  ?Output --  ?Net 420 ml  ? ?  ? ?  ? ?Physical Exam: ?Vital Signs ?Blood pressure 118/72, pulse (!) 58, temperature 98 ?F (36.7 ?C), temperature source Oral, resp. rate 16, height 5\' 4"  (1.626 m), weight 68 kg, SpO2 99 %. ? ?Constitutional: No distress . Vital signs reviewed. ?HEENT: NCAT, EOMI, oral membranes moist ?Neck: supple ?Cardiovascular: RRR without murmur. No JVD    ?Respiratory/Chest: CTA Bilaterally without wheezes or rales. Normal effort    ?GI/Abdomen: BS +, non-tender, non-distended. PEG stoma dry, granulating in ?Ext: no clubbing, cyanosis, or edema ?Psych: flat  ?Musculoskeletal: Patch on L shoulder ?   Cervical back: Neck supple. No tenderness.  ?   Comments: RUE- 4/5 or more- hard to test ?RLE 3+/5 proximally and 4-/5 distally ?LUE- 0/5 ?LLE- 2-/5 in HF and PF only ?  ?Skin: ?   General: Skin is warm and dry.  ?   Comments: Scab 1.5 cm on L forearm-   healing ?Neurological:  ?   Mental Status: She is alert and oriented to person, place, and time.  ?   Comments: L inattention ?Intact to light touch in all 4 extremities ?Dysconjugate gaze, but denies double vision.  ?MAS of 2-3 in LUE_ shoulder MAS of 3; elbow 2-3 and MAS of 2 in L wrist ?MAS 3 L hamstring  ?  ?  ? ? ? ? ?Assessment/Plan: ?1. Functional deficits which require 3+ hours per day of interdisciplinary therapy in a comprehensive inpatient rehab setting. ?Physiatrist is providing close team supervision  and 24 hour management of active medical problems listed below. ?Physiatrist and rehab team continue to assess barriers to discharge/monitor patient progress toward functional and medical goals ? ?Care Tool: ? ?Bathing ?   ?Body parts bathed by patient: Front perineal area, Buttocks  ? Body parts bathed by helper: Buttocks, Front perineal area ?  ?  ?Bathing assist Assist Level: Maximal Assistance - Patient 24 - 49% ?  ?  ?Upper Body Dressing/Undressing ?Upper body dressing   ?What is the patient wearing?: Hospital gown only ?   ?Upper body assist Assist Level: Moderate Assistance - Patient 50 - 74% ?   ?Lower Body Dressing/Undressing ?Lower body dressing ? ? ?   ?What is the patient wearing?: Hospital gown only ? ?  ? ?Lower body assist Assist for lower body dressing: Maximal Assistance - Patient 25 - 49% ?   ? ?Toileting ?Toileting    ?Toileting assist Assist for toileting: Total Assistance - Patient < 25% ?  ?  ?Transfers ?Chair/bed transfer ? ?Transfers assist ?   ? ?Chair/bed transfer assist level: Moderate Assistance - Patient 50 -  74% ?  ?  ?Locomotion ?Ambulation ? ? ?Ambulation assist ? ?   ? ?Assist level: Moderate Assistance - Patient 50 - 74% ?Assistive device: Other (comment) (hall rail) ?Max distance: 30  ? ?Walk 10 feet activity ? ? ?Assist ? Walk 10 feet activity did not occur: Safety/medical concerns ? ?Assist level: Moderate Assistance - Patient - 50 - 74% ?Assistive device: Other (comment) (hall rail)  ? ?Walk 50 feet activity ? ? ?Assist Walk 50 feet with 2 turns activity did not occur: Safety/medical concerns ? ?  ?   ? ? ?Walk 150 feet activity ? ? ?Assist Walk 150 feet activity did not occur: Safety/medical concerns ? ?  ?  ?  ? ?Walk 10 feet on uneven surface  ?activity ? ? ?Assist Walk 10 feet on uneven surfaces activity did not occur: Safety/medical concerns ? ? ?  ?   ? ?Wheelchair ? ? ? ? ?Assist Is the patient using a wheelchair?: Yes ?Type of Wheelchair: Manual ?  ? ?Wheelchair assist  level: Dependent - Patient 0% ?Max wheelchair distance: 110ft  ? ? ?Wheelchair 50 feet with 2 turns activity ? ? ? ?Assist ? ?  ?  ? ? ?Assist Level: Dependent - Patient 0%  ? ?Wheelchair 150 feet activity  ? ? ? ?Assist ?   ? ? ?Assist Level: Dependent - Patient 0%  ? ?Blood pressure 118/72, pulse (!) 58, temperature 98 ?F (36.7 ?C), temperature source Oral, resp. rate 16, height 5\' 4"  (1.626 m), weight 68 kg, SpO2 99 %. ? ?Medical Problem List and Plan: ?1. 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            -patient may  shower- cover PEG ?            -ELOS/Goals: 14-16 days min A ? HFU scheduled ? -Continue CIR therapies including PT, OT, and SLP  ?2.  Antithrombotics: ?-DVT/anticoagulation:  Pharmaceutical: Other (comment)Eliquis ?            -antiplatelet therapy: none ?3. Shoulder Pain: continue Tylenol prn, Robaxin and Neurotin scheduled, Lidoderm patch.  ?4. Mood: team  providing emotional support, appreciate chaplain! ?            --continue sertraline 50 mg daily ?            -antipsychotic agents: n/a ?5. Neuropsych: This patient is capable of making decisions on her own behalf. ?6. S/p PEG: PEG removed 5/5 without complication.  ? -stoma healing nicely ?7. Fluids/Electrolytes/Nutrition: Routine Is and Os and follow-up chemistries ?8: Left-sided spasticity: Primarily hamstring decrease baclofen to 10mg  HS. Kpad ordered. Add 5mg  at 2pm. Messaged therapy to provide daughter with stretching exercises. Discussed Botox outpatient.  ?9: Dysphagia: continue dys 2 thin liquid diet, provided education regarding nutritious D2 foods. Goals modI with least restrictive diet. Recommended smoothies and soups for good nutrition at home, and if her daughter can bring in here.  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 Increase magnesium gluconate to 500mg  HS. Continue Cozaar 25mg  daily. Increase klor to daily. ?Vitals:   ? 04/22/22 1937 04/23/22 0432  ?BP: 138/81 118/72  ?Pulse: (!) 58 (!) 58  ?Resp: 16 16  ?Temp: 98.5 ?F (36.9 ?C) 98 ?F (36.7 ?C)  ?SpO2: 98% 99%  ? ? ?12. Constipation: increased magnesium gluconate to 500mg  HS. Provided with list of high fiber foods. Decreased Baclofen to 10mg  HS ?13. Hypokalemia: increase klor to daily ? ?  Latest Ref Rng & Units 04/16/2022  ? 11:47 PM 04/13/2022  ? 11:34 AM 04/10/2022  ?  5:41 AM  ?BMP  ?Glucose 70 - 99 mg/dL 94   601   093    ?BUN 6 - 20 mg/dL 7   10   10     ?Creatinine 0.44 - 1.00 mg/dL   2.35   5.73    ?Sodium 135 - 145 mmol/L 137   138   137    ?Potassium 3.5 - 5.1 mmol/L 3.5   3.8   3.8    ?Chloride 98 - 111 mmol/L 109   107   108    ?CO2 22 - 32 mmol/L 21   22   21     ?Calcium 8.9 - 10.3 mg/dL 2.20     25.4    ? ? ?14.  CVA related fatigue, minimize sedating meds  ?15. Left sided inattention, poor visual scanning: continue to encourage turning to left.  ?16. Bradycardia: decrease Coreg to 12.5mg  BID, continue to monitor HR TID.  ?  5/7 HR IN 50's -60's ?  ? ?LOS: ?19 days ?A FACE TO FACE EVALUATION WAS PERFORMED ? ?62.3 ?04/23/2022, 8:17 AM  ? ?  ?

## 2022-04-23 NOTE — Progress Notes (Addendum)
Speech Language Pathology Daily Session Note ? ?Patient Details  ?Name: Sarah Myers ?MRN: [id number] ?Date of Birth: [birth year] ? ?Today's Date: 04/23/2022 ?SLP Individual Time: 1305-1400 ?SLP Individual Time Calculation (min): 55 min ? ?Short Term Goals: ?Week 3: SLP Short Term Goal 1 (Week 3): STG's = LTG's due to ELOS ? ?Skilled Therapeutic Interventions: ?  Skilled ST services focused on dysphagia goals this session. SLP facilitated PO meal intake of dys3 texture with thin liquids via straw. Pt required meal set-up due to vision. Encouraged to scan to L to ensure all food has been seen. SLP also recommended using spoon vs. [place] due to having difficulty scooping pieces of food. SLP provided verbal cues for tongue sweep and liquid wash. With supA, pt successfully consumed meal with limited oral residue. Rec continuation of current diet. SLP assist pt with calling daughter. Pt was left in room, all immediate needs within reach and bed alarm activated. Cont with current POC.  ? ?Pain ?Pain Assessment ?Pain Scale: 0-10 ?Pain Score: 0-No pain ? ?Therapy/Group: Individual Therapy ? ?Sarah Myers ?04/23/2022, 1:49 PM ?

## 2022-04-23 NOTE — Progress Notes (Signed)
Physical Therapy Session Note ? ?Patient Details  ?Name: Sarah Myers ?MRN: [id number] ?Date of Birth: 02/10/1971 ? ?Today's Date: 04/23/2022 ?PT Individual Time: 1050-1130 ?PT Individual Time Calculation (min): 40 min  ? ?Short Term Goals: ?Week 3:  PT Short Term Goal 1 (Week 3): STG = LTG due to ELOS ? ?Skilled Therapeutic Interventions/Progress Updates:  ?   ?Patient in bed with nursing staff x3 at bedside reporting patient fell during rolling for changing her brief upon PT arrival. [facility]als [facility] per LPN. Patient alert and agreeable to PT session. Patient denied pain following the fall and during session. Reports she hit her head on the chair when she fell, no tenderness to palpation or sign of swelling or bruising, patient alert and oriented to self and situation, denies changes in vision. Able to move all extremities without pain, L hemi limiting on L, patient reports no increased weakness or change in sensation. Able range through slow PROM on L without increased pain, slow movement due to flexor tone. Noted mild mid back muscular pain in unsupported sitting, LPN made aware assessment and findings.  ? ?Spoke with patient's daughter, [name], per patient's request, explained events of the fall and results of examination above. Patient and her daughter appropriately upset about the event, both relieved that the patient presents without injury at this time.  ? ?Therapeutic Activity: ?Bed Mobility: Patient performed supine to/from sit with min A in a flat bed with min use of bed rail. Provided verbal cues for progressing through side-lying for improved trunk control. Patient sat EOB with min A progressing to supervision with initially fear of scooting EOB, improved with therapist sitting with the patient for improved confidence.  ?Transfers: Patient performed sit to/from stand x2 in the [place] with min A-CGA and x2 holding on to therapist's arms with min-mod A with PT blocking L foot and knee to prevent hip adduction  and knee buckling without signs of buckling. Provided verbal cues for forward weight shift, equal weight bearing, and increased gluteal activation. ? ?Patient sitting up in the bed at end of session with breaks locked, bed alarm set, and all needs within reach.  ? ?Therapy Documentation ?Precautions:  ?Precautions ?Precautions: Fall ?Precaution Comments: L inattention, PEG, left hemi, right head gaze ?Restrictions ?Weight Bearing Restrictions: No ? ? ? ?Therapy/Group: Individual Therapy ? ?Helayne Seminole PT, DPT ? ?04/23/2022, 4:01 PM  ?

## 2022-04-24 DIAGNOSIS — E876 Hypokalemia: Secondary | ICD-10-CM | POA: Diagnosis not present

## 2022-04-24 DIAGNOSIS — I619 Nontraumatic intracerebral hemorrhage, unspecified: Secondary | ICD-10-CM | POA: Diagnosis not present

## 2022-04-24 DIAGNOSIS — R252 Cramp and spasm: Secondary | ICD-10-CM | POA: Diagnosis not present

## 2022-04-24 DIAGNOSIS — I10 Essential (primary) hypertension: Secondary | ICD-10-CM | POA: Diagnosis not present

## 2022-04-24 LAB — GLUCOSE, CAPILLARY
Glucose-Capillary: 125 mg/dL — ABNORMAL HIGH (ref 70–99)
Glucose-Capillary: 119 mg/dL — ABNORMAL HIGH (ref 70–99)
Glucose-Capillary: 105 mg/dL — ABNORMAL HIGH (ref 70–99)
Glucose-Capillary: 104 mg/dL — ABNORMAL HIGH (ref 70–99)

## 2022-04-24 LAB — BASIC METABOLIC PANEL
Anion gap: 5 (ref 5–15)
BUN: 6 mg/dL (ref 6–20)
CO2: 24 mmol/L (ref 22–32)
Calcium: 11.2 mg/dL — ABNORMAL HIGH (ref 8.9–10.3)
Chloride: 111 mmol/L (ref 98–111)
Creatinine, Ser: 0.89 mg/dL (ref 0.44–1.00)
GFR, Estimated: >60 mL/min (ref >60)
Glucose, Bld: 100 mg/dL — ABNORMAL HIGH (ref 70–99)
Potassium: 3.7 mmol/L (ref 3.5–5.1)
Sodium: 140 mmol/L (ref 135–145)

## 2022-04-24 LAB — CBC
HCT: 41.1 % (ref 36.0–46.0)
Hemoglobin: 13.6 g/dL (ref 12.0–15.0)
MCH: 28.8 pg (ref 26.0–34.0)
MCHC: 33.1 g/dL (ref 30.0–36.0)
MCV: 87.1 fL (ref 80.0–100.0)
Platelets: 200 10*3/uL (ref 150–400)
RBC: 4.72 MIL/uL (ref 3.87–5.11)
RDW: 13.2 % (ref 11.5–15.5)
WBC: 3.4 10*3/uL — ABNORMAL LOW (ref 4.0–10.5)
nRBC: 0 % (ref 0.0–0.2)

## 2022-04-24 MED ORDER — LIDOCAINE 5 % EX PTCH: 1.0000 | MEDICATED_PATCH | Freq: Every day | CUTANEOUS | Status: DC | PRN

## 2022-04-24 NOTE — Progress Notes (Signed)
?                                                       PROGRESS NOTE ? ? ?Subjective/Complaints: ?No new complaints ?Excited to go home soon ?Has not had any shoulder pain for some time.  ? ?ROS: Denies shoulder pain ? ?Objective: ?  ?No results found. ?Recent Labs  ?  04/24/22 ?[id number]  ?WBC 3.4*  ?HGB 13.6  ?HCT 41.1  ?PLT 200  ? ? ? ? ?Recent Labs  ?  04/24/22 ?[id number]  ?NA 140  ?K 3.7  ?CL 111  ?CO2 24  ?GLUCOSE 100*  ?BUN 6  ?CREATININE 0.89  ?CALCIUM 11.2*  ? ? ? ? ?Intake/Output Summary (Last 24 hours) at 04/24/2022 0903 ?Last data filed at 04/23/2022 2100 ?Gross per 24 hour  ?Intake 240 ml  ?Output --  ?Net 240 ml  ? ?  ? ?  ? ?Physical Exam: ?Vital Signs ?Blood pressure 137/86, pulse 69, temperature 97.6 ?F (36.4 ?C), temperature source Oral, resp. rate 16, height 5\' 4"  (1.626 m), weight 66 kg, SpO2 100 %. ? ?Constitutional: No distress . Vital signs reviewed. BMI 24.98 ?HEENT: NCAT, EOMI, oral membranes moist ?Neck: supple ?Cardiovascular: RRR without murmur. No JVD    ?Respiratory/Chest: CTA Bilaterally without wheezes or rales. Normal effort    ?GI/Abdomen: BS +, non-tender, non-distended. PEG stoma dry, granulating in ?Ext: no clubbing, cyanosis, or edema ?Psych: flat  ?Musculoskeletal: Patch on L shoulder ?   Cervical back: Neck supple. No tenderness.  ?   Comments: RUE- 4/5 or more- hard to test ?RLE 3+/5 proximally and 4-/5 distally ?LUE- 0/5 ?LLE- 2-/5 in HF and PF only ?  ?Skin: ?   General: Skin is warm and dry.  ?   Comments: Scab 1.5 cm on L forearm-   healing ?Neurological:  ?   Mental Status: She is alert and oriented to person, place, and time.  ?   Comments: L inattention ?Intact to light touch in all 4 extremities ?Dysconjugate gaze, but denies double vision.  ?MAS of 2-3 in LUE_ shoulder MAS of 3; elbow 2-3 and MAS of 2 in L wrist ?MAS 3 L hamstring  ?  ?  ? ? ? ? ?Assessment/Plan: ?1. Functional deficits which require 3+ hours per day of interdisciplinary therapy in a comprehensive inpatient  rehab setting. ?Physiatrist is providing close team supervision and 24 hour management of active medical problems listed below. ?Physiatrist and rehab team continue to assess barriers to discharge/monitor patient progress toward functional and medical goals ? ?Care Tool: ? ?Bathing ?   ?Body parts bathed by patient: Front perineal area, Buttocks  ? Body parts bathed by helper: Buttocks, Front perineal area ?  ?  ?Bathing assist Assist Level: Maximal Assistance - Patient 24 - 49% ?  ?  ?Upper Body Dressing/Undressing ?Upper body dressing   ?What is the patient wearing?: [place] only ?   ?Upper body assist Assist Level: Moderate Assistance - Patient 50 - 74% ?   ?Lower Body Dressing/Undressing ?Lower body dressing ? ? ?   ?What is the patient wearing?: [place] only ? ?  ? ?Lower body assist Assist for lower body dressing: Maximal Assistance - Patient 25 - 49% ?   ? ?Toileting ?Toileting    ?Toileting assist Assist for toileting: Total  Assistance - Patient < 25% ?  ?  ?Transfers ?Chair/bed transfer ? ?Transfers assist ?   ? ?Chair/bed transfer assist level: Moderate Assistance - Patient 50 - 74% ?  ?  ?Locomotion ?Ambulation ? ? ?Ambulation assist ? ?   ? ?Assist level: Moderate Assistance - Patient 50 - 74% ?Assistive device: Other (comment) (hall rail) ?Max distance: 30  ? ?Walk 10 feet activity ? ? ?Assist ? Walk 10 feet activity did not occur: Safety/medical concerns ? ?Assist level: Moderate Assistance - Patient - 50 - 74% ?Assistive device: Other (comment) (hall rail)  ? ?Walk 50 feet activity ? ? ?Assist Walk 50 feet with 2 turns activity did not occur: Safety/medical concerns ? ?  ?   ? ? ?Walk 150 feet activity ? ? ?Assist Walk 150 feet activity did not occur: Safety/medical concerns ? ?  ?  ?  ? ?Walk 10 feet on uneven surface  ?activity ? ? ?Assist Walk 10 feet on uneven surfaces activity did not occur: Safety/medical concerns ? ? ?  ?   ? ?Wheelchair ? ? ? ? ?Assist Is the patient using a  wheelchair?: Yes ?Type of Wheelchair: Manual ?  ? ?Wheelchair assist level: Dependent - Patient 0% ?Max wheelchair distance: 137ft  ? ? ?Wheelchair 50 feet with 2 turns activity ? ? ? ?Assist ? ?  ?  ? ? ?Assist Level: Dependent - Patient 0%  ? ?Wheelchair 150 feet activity  ? ? ? ?Assist ?   ? ? ?Assist Level: Dependent - Patient 0%  ? ?Blood pressure 137/86, pulse 69, temperature 97.6 ?F (36.4 ?C), temperature source Oral, resp. rate 16, height 5\' 4"  (1.626 m), weight 66 kg, SpO2 100 %. ? ?Medical Problem List and Plan: ?1. 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            -patient may  shower- cover PEG ?            -ELOS/Goals: 14-16 days min A ? HFU scheduled ? -Continue CIR therapies including PT, OT, and SLP  ? Discussed planned d/c on Wednesday ?2.  Antithrombotics: ?-DVT/anticoagulation:  Pharmaceutical: Other (comment)Eliquis ?            -antiplatelet therapy: none ?3. Shoulder Pain: continue Tylenol prn, Robaxin and Neurotin scheduled, change Lidoderm patch to prn ?4. Depression: team  providing emotional support, appreciate chaplain! ?            --continue sertraline 50 mg daily ? -discussed that this is common after stroke.  ?5. Neuropsych: This patient is capable of making decisions on her own behalf. ?6. S/p PEG: PEG removed 5/5 without complication.  ? -stoma healing nicely ?7. Fluids/Electrolytes/Nutrition: Routine Is and Os and follow-up chemistries ?8: Left-sided spasticity: Primarily hamstring continue baclofen to 10mg  HS. Kpad ordered. Add 5mg  at 2pm. Messaged therapy to provide daughter with stretching exercises. Discussed Botox outpatient.  ?9: Dysphagia: continue dys 2 thin liquid diet, provided education regarding nutritious D2 foods. Goals modI with least restrictive diet. Recommended smoothies and soups for good nutrition at home, and if her daughter can bring in here.  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 Increase magnesium gluconate to 500mg  HS. Continue Cozaar 25mg  daily. Increase klor to 29meq daily. ?Vitals:  ? 04/23/22 2353 04/24/22 0401  ?BP: (!) 151/82 137/86  ?Pulse: 60 69  ?Resp:  16  ?Temp:  97.6 ?F (36.4 ?C)  ?SpO2:  100%  ? ? ?12. Constipation:  increased magnesium gluconate to 500mg  HS. Provided with list of high fiber foods. Decreased Baclofen to 10mg  HS ?13. Hypokalemia: increase klor to 74meq daily ? ?  Latest Ref Rng & Units 04/24/2022  ?  5:16 AM 04/16/2022  ? 11:47 PM 04/13/2022  ? 11:34 AM  ?BMP  ?Glucose 70 - 99 mg/dL 100   94   119    ?BUN 6 - 20 mg/dL 6   7   10     ?Creatinine 0.44 - 1.00 mg/dL 0.89   0.83   0.92    ?Sodium 135 - 145 mmol/L 140   137   138    ?Potassium 3.5 - 5.1 mmol/L 3.7   3.5   3.8    ?Chloride 98 - 111 mmol/L 111   109   107    ?CO2 22 - 32 mmol/L 24   21   22     ?Calcium 8.9 - 10.3 mg/dL 11.2   10.8   11.1    ? ? ?14.  CVA related fatigue, minimize sedating meds  ?15. Left sided inattention, poor visual scanning: continue to encourage turning to left.  ?16. Bradycardia: decrease Coreg to 12.5mg  BID, continue to monitor HR TID.  ?  5/7 HR IN 50's -60's ?  ? ?LOS: ?20 days ?A FACE TO FACE EVALUATION WAS PERFORMED ? ?Martha Clan P Lynette Noah ?04/24/2022, 9:03 AM  ? ?  ?

## 2022-04-24 NOTE — Progress Notes (Signed)
Physical Therapy Session Note ? ?Patient Details  ?Name: Sarah Myers ?MRN: [id number] ?Date of Birth: Jul 27, 1971 ? ?Today's Date: 04/24/2022 ?PT Individual Time: 3903-0092 ?PT Individual Time Calculation (min): 49 min  ? ?Short Term Goals: ?Week 2:  PT Short Term Goal 1 (Week 2): Pt will complete bed mobility with minA and LRAD ?PT Short Term Goal 1 - Progress (Week 2): Not met ?PT Short Term Goal 2 (Week 2): Pt will complete bed<>chair transfers with minA and LRAD ?PT Short Term Goal 2 - Progress (Week 2): Not met ?PT Short Term Goal 3 (Week 2): Pt will ambulate 68f with maxA and LRAD ?PT Short Term Goal 3 - Progress (Week 2): Not met ?PT Short Term Goal 4 (Week 2): Pt will initiate stair training ?PT Short Term Goal 4 - Progress (Week 2): Not met ?Week 3:  PT Short Term Goal 1 (Week 3): STG = LTG due to ELOS ? ? ?Skilled Therapeutic Interventions/Progress Updates:  ?  Pt presents in bed stating "oh great finally a therapist has come" stating she didn't think she has had any therapy in days. Showed her the schedule that she had 2 therapies this morning and also both days this weekend. Decreased recall noted. Agreeable to session and educated patient that orthotist likely would be joining [place] session to assess for AFO. Pt agreeable. Performed bed mobility with min to mod assist to come EOB with use of bedrail for support and cues for technique. Once EOB scoots to EOB with extra time. Donned pants, socks and shoes with total assist for time management. Attempted use of RW for sit > stand as previous notes indicated but due to tone in LUE unable to functionally use to deferred use of this. [place] [place]. Performed min assist squat pivot to the w/c with cues for technique and hand placement. Performed NMR during gait at rail in hallway without DF wrap to assess and pt required overall mod assist with max assist for advancement of LLE. Does better with hip flexion activation when  PT cues with "step over object". Pt demonstrates fair knee control. Significant L foot eversion noted as well. Discussed with orthotist, recommending toe cap and Sprystep AFO. Took shoe and will plan to return ASAP to allow for practice in therapy session tomorrow. Pt agreeable. NMR continues with focus on sit <> stand technique, transitional movement, and then weightshifting and blocked practice hip activation in standing with RUE support. Pt then request to use bathroom and performed mod assist [facility] and then use of Stedy for clothing management and hygiene as well as transfer back to bed with min assist. Repositioned with min assist and all needs in reach.  ? ?Therapy Documentation ?Precautions:  ?Precautions ?Precautions: Fall ?Precaution Comments: L inattention, PEG, left hemi, right head gaze ?Restrictions ?Weight Bearing Restrictions: No ?  ?  ?Pain: ? No complaints of pain.  ? ? ?Therapy/Group: Individual Therapy ? ?GAllayne Gitelman?ALars Masson PT, DPT, CBIS ? ?04/24/2022, 1:59 PM  ?

## 2022-04-24 NOTE — Progress Notes (Signed)
Speech Language Pathology Daily Session Note ? ?Patient Details  ?Name: Sarah Myers ?MRN: [id number] ?Date of Birth: 01/29/1971 ? ?Today's Date: 04/24/2022 ?SLP Individual Time: 3474-2595 ?SLP Individual Time Calculation (min): 60 min ? ?Short Term Goals: ?Week 3: SLP Short Term Goal 1 (Week 3): STG's = LTG's due to ELOS ? ?Skilled Therapeutic Interventions: ?Pt seen this date for skilled ST intervention targeting dysphagia and cognitive-linguistic goals outlined in care plan. Pt received lying in bed, with eyes closed. Eyes opened when SLP stated pt's name. Verbalized that she had a fall over the weekend, though she did not recall how it happened. Denies changes in cognitive-linguistic skills. Agreeable to ST intervention. ? ?SLP facilitated today's session by providing overall Min-Mod verbal A for functional + basic verbal problem-solving and verbal sequencing during transfer from bed to [facility] in bathroom and BSC to recliner chair via Stedy + 2 for safety. Min-Mod verbal A for demonstration of emergent awareness re: current deficits and how they are impacting her daily function to include eating, thinking, mobility, and self-care. Continues to benefit from Mod verbal A for visual scanning to L field. SLP provided Total A for recall of safe swallowing strategies/aspiration precautions. Following education, SLP provided Min verbal A for implementation, which was successfully faded to faded to Sup A for the remainder of AM meal. Pt communicated wants and needs within functional context given Min verbal A and Sup A secondary to decreased initiation; nevertheless, improvement from last week.  ? ?Re: deglutition, tolerated dysphagia 3 textures and thin liquid via straw with implementation of safe swallow strategies. Reinforced intake of nutritional supplementation since she consumed <25% of her meal. With intermittent cueing, pt accepted small sips of Boost Breeze via straw. No evidence of pharyngeal dysphagia observed  with no oral residuals post-swallow. ? ?SLP left pt in bed with bed alarm on, call bell reviewed and within reach, and all immediate needs met. Continue per current ST POC.  ? ?Pain ?Pt does not report pain this session; NAD noted. ? ?Therapy/Group: Individual Therapy ? ?Majesta Leichter A Karas Pickerill ?04/24/2022, 10:32 AM ?

## 2022-04-24 NOTE — Progress Notes (Signed)
Orthopedic Tech Progress Note ?Patient Details:  ?Sarah Myers ?Sep 11, 1971 ?[id number] ? ?Called in order to [facility] for an AFO CONSULT  ? ?Patient ID: [name], female   DOB: October 07, 1971, 51 y.o.   MRN: [id number] ? ?Donald Pore ?04/24/2022, 4:25 PM ? ?

## 2022-04-24 NOTE — Progress Notes (Signed)
Occupational Therapy Session Note ? ?Patient Details  ?Name: Sarah Myers ?MRN: [id number] ?Date of Birth: December 07, 1971 ? ?Today's Date: 04/24/2022 ?OT Individual Time: 4098-1191 ?OT Individual Time Calculation (min): 38 min  ? ? ?Short Term Goals: ?Week 3:  OT Short Term Goal 1 (Week 3): STGs=LTGs due to ELOS ? ?Skilled Therapeutic Interventions/Progress Updates:  ?Skilled OT intervention completed with focus on toileting, ADL retraining. Pt received upright in bed, requesting to use toilet. Completed bed mobility with min A for LLE management with good trunk control this morning. 2/2 floor wet from just being mopped, completed sit > stand in stedy with min A, therapist total A positioning LLE to prevent external rotation/hip abduction and guarding on L side with CGA during dependent transfer to [facility] over toilet and w/c. Max A needed for toileting with pt able to maintain stance in stedy with CGA and cues needed for weight shifting > R.  ? ?Seated at sink, completed self-care with min A for UB bathing for RUE, min A UB dressing via overhead shirt with cues needed for hemi-technique for doffing/donning. Oral care completed with min A for managing toothpaste cap and spit cup, with therapist promoting R visual scanning by placing items on R side however pt required mod to max cues for locating items. Pt requesting to return to bed vs in recliner 2/2 long therapy break. Sit > stand with min A with therapist blocking L side, however heavy L lean demonstrated with pt unable to weight shift towards R despite max A, therefore transitioned to squat pivot transfer > L side with mod A and cues throughout. Mod A for bed mobility with pt hooking L leg with R for increased independence. Pt able to reposition self with min A for scooting hips. Pt was left upright in bed, with bed alarm on, all bed rails up per request and all needs in reach at end of session. ?  ? ?Therapy Documentation ?Precautions:  ?Precautions ?Precautions:  Fall ?Precaution Comments: L inattention, PEG, left hemi, right head gaze ?Restrictions ?Weight Bearing Restrictions: No ? ? ? ? ?Therapy/Group: Individual Therapy ? ?Xiadani Damman E Emmarose Klinke ?04/24/2022, 7:33 AM ?

## 2022-04-25 DIAGNOSIS — I10 Essential (primary) hypertension: Secondary | ICD-10-CM | POA: Diagnosis not present

## 2022-04-25 DIAGNOSIS — I619 Nontraumatic intracerebral hemorrhage, unspecified: Secondary | ICD-10-CM | POA: Diagnosis not present

## 2022-04-25 DIAGNOSIS — R252 Cramp and spasm: Secondary | ICD-10-CM | POA: Diagnosis not present

## 2022-04-25 DIAGNOSIS — E876 Hypokalemia: Secondary | ICD-10-CM | POA: Diagnosis not present

## 2022-04-25 LAB — GLUCOSE, CAPILLARY
Glucose-Capillary: 107 mg/dL — ABNORMAL HIGH (ref 70–99)
Glucose-Capillary: 115 mg/dL — ABNORMAL HIGH (ref 70–99)
Glucose-Capillary: 98 mg/dL (ref 70–99)
Glucose-Capillary: 119 mg/dL — ABNORMAL HIGH (ref 70–99)

## 2022-04-25 MED ORDER — BACLOFEN 5 MG HALF TABLET
15.0000 mg | ORAL_TABLET | Freq: Every day | ORAL | Status: DC
  Administered 2022-04-25: 15 mg via ORAL
  Filled 2022-04-25: qty 1

## 2022-04-25 MED ORDER — CARVEDILOL 6.25 MG PO TABS
6.2500 mg | ORAL_TABLET | Freq: Two times a day (BID) | ORAL | Status: DC
Start: 2022-04-25 — End: 2022-04-26
  Administered 2022-04-25 – 2022-04-26 (×2): 6.25 mg via ORAL
  Filled 2022-04-25 (×2): qty 1

## 2022-04-25 NOTE — Progress Notes (Signed)
Orthopedic Tech Progress Note ?Patient Details:  ?Sarah Myers ?[birth year] ?[id number] ? ?Yet again I called in same order from yesterday to [facility] for an AFO CONSULT, same side ? ? Patient ID: Sarah Myers, female   DOB: [birth year], 51 y.o.   MRN: [id number] ? ?Sarah Myers ?04/25/2022, 5:24 PM ? ?

## 2022-04-25 NOTE — Plan of Care (Signed)
?  Problem: RH Swallowing ?Goal: LTG Patient will consume least restrictive diet using compensatory strategies with assistance (SLP) ?Description: LTG:  Patient will consume least restrictive diet using compensatory strategies with assistance (SLP) ?Outcome: Completed/Met ?Flowsheets (Taken 04/25/2022 1219) ?LTG: Pt Patient will consume least restrictive diet using compensatory strategies with assistance of (SLP): Minimal Assistance - Patient > 75% ?Goal: LTG Patient will participate in dysphagia therapy to increase swallow function with assistance (SLP) ?Description: LTG:  Patient will participate in dysphagia therapy to increase swallow function with assistance (SLP) ?Outcome: Completed/Met ?Flowsheets (Taken 04/25/2022 1219) ?LTG: Pt will participate in dysphagia therapy to increase swallow function with assistance of (SLP): Minimal Assistance - Patient > 75% ?Goal: LTG Pt will demonstrate functional change in swallow as evidenced by bedside/clinical objective assessment (SLP) ?Description: LTG: Patient will demonstrate functional change in swallow as evidenced by bedside/clinical objective assessment (SLP) ?Outcome: Completed/Met ?Flowsheets (Taken 04/05/2022 1508 by Buzzy Han, CCC-SLP) ?LTG: Patient will demonstrate functional change in swallow as evidenced by bedside/clinical objective assessment: Oral swallow ?  ?Problem: RH Problem Solving ?Goal: LTG Patient will demonstrate problem solving for (SLP) ?Description: LTG:  Patient will demonstrate problem solving for basic/complex daily situations with cues  (SLP) ?Outcome: Completed/Met ?Flowsheets ?Taken 04/25/2022 1219 ?LTG Patient will demonstrate problem solving for: Moderate Assistance - Patient 50 - 74% ?Taken 04/19/2022 0612 ?LTG: Patient will demonstrate problem solving for (SLP): Basic daily situations ?  ?Problem: RH Attention ?Goal: LTG Patient will demonstrate this level of attention during functional activites (SLP) ?Description: LTG:  Patient will  will demonstrate this level of attention during functional activites (SLP) ?Outcome: Completed/Met ?Flowsheets ?Taken 04/25/2022 1219 ?LTG: Patient will demonstrate this level of attention during cognitive/linguistic activities with assistance of (SLP): Minimal Assistance - Patient > 75% ?Taken 04/19/2022 0612 ?Patient will demonstrate during cognitive/linguistic activities the attention type of: Sustained ?Patient will demonstrate this level of attention during cognitive/linguistic activities in: Home ?Number of minutes patient will demonstrate attention during cognitive/linguistic activities: 5 ?  ?Problem: RH Awareness ?Goal: LTG: Patient will demonstrate awareness during functional activites type of (SLP) ?Description: LTG: Patient will demonstrate awareness during functional activites type of (SLP) ?Outcome: Completed/Met ?Flowsheets ?Taken 04/25/2022 1219 by Helaine Chess A, CCC-SLP ?LTG: Patient will demonstrate awareness during cognitive/linguistic activities with assistance of (SLP): Moderate Assistance - Patient 50 - 74% ?Taken 04/05/2022 1508 by Buzzy Han, CCC-SLP ?Patient will demonstrate during cognitive/linguistic activities awareness type of: Emergent ?  ?

## 2022-04-25 NOTE — Progress Notes (Signed)
Inpatient Rehabilitation Discharge Medication Review by a Pharmacist ? ?A complete drug regimen review was completed for this patient to identify any potential clinically significant medication issues. ? ?High Risk Drug Classes Is patient taking? Indication by Medication  ?Antipsychotic No   ?Anticoagulant Yes Eliquis for CVA, PE  ?Antibiotic No   ?Opioid No   ?Antiplatelet No   ?Hypoglycemics/insulin Yes SSI for DM  ?Vasoactive Medication Yes Amlodipine, carvedilol, hydralazine, losartan for BP  ?Chemotherapy No   ?Other Yes Amantadine for attention ?Clonazepam for anxiety ?Gabapentin for nerve pain ?Zoloft for mood  ? ? ? ?Type of Medication Issue Identified Description of Issue Recommendation(s)  ?Drug Interaction(s) (clinically significant) ?    ?Duplicate Therapy ?    ?Allergy ?    ?No Medication Administration End Date ?    ?Incorrect Dose ?    ?Additional Drug Therapy Needed ?    ?Significant med changes from prior encounter (inform family/care partners about these prior to discharge).    ?Other ?    ? ? ?Clinically significant medication issues were identified that warrant physician communication and completion of prescribed/recommended actions by midnight of the next day:  No ? ?Pharmacist comments: None ? ?Time spent performing this drug regimen review (minutes):  20 minutes ? ? ?Elwin Sleight ?04/25/2022 9:40 AM ?

## 2022-04-25 NOTE — Progress Notes (Signed)
?                                                       PROGRESS NOTE ? ? ?Subjective/Complaints: ?Complained that a nurse overnight asked her to stop calling because she was calling so frequently- discussed with Victorino Dike ?Daughter asks if she will be able to be with patient in ambulance tomorrow ? ?ROS: Denies shoulder pain, +anxiety ? ?Objective: ?  ?No results found. ?Recent Labs  ?  04/24/22 ?1610  ?WBC 3.4*  ?HGB 13.6  ?HCT 41.1  ?PLT 200  ? ? ? ? ?Recent Labs  ?  04/24/22 ?9604  ?NA 140  ?K 3.7  ?CL 111  ?CO2 24  ?GLUCOSE 100*  ?BUN 6  ?CREATININE 0.89  ?CALCIUM 11.2*  ? ? ? ? ?Intake/Output Summary (Last 24 hours) at 04/25/2022 1018 ?Last data filed at 04/25/2022 0732 ?Gross per 24 hour  ?Intake 593 ml  ?Output --  ?Net 593 ml  ? ?  ? ?  ? ?Physical Exam: ?Vital Signs ?Blood pressure 140/78, pulse 61, temperature 97.8 ?F (36.6 ?C), temperature source Oral, resp. rate 15, height 5\' 4"  (1.626 m), weight 66 kg, SpO2 97 %. ? ?Constitutional: No distress . Vital signs reviewed. BMI 24.98 ?HEENT: NCAT, EOMI, oral membranes moist ?Neck: supple ?Cardiovascular: RRR without murmur. No JVD    ?Respiratory/Chest: CTA Bilaterally without wheezes or rales. Normal effort    ?GI/Abdomen: BS +, non-tender, non-distended. PEG stoma dry, granulating in ?Ext: no clubbing, cyanosis, or edema ?Psych: flat  ?Musculoskeletal: Patch on L shoulder ?   Cervical back: Neck supple. No tenderness.  ?   Comments: RUE- 4/5 or more- hard to test ?RLE 3+/5 proximally and 4-/5 distally ?LUE- 0/5 ?LLE- 2-/5 in HF and PF only ?Using RW for sit to stand ?  ?Skin: ?   General: Skin is warm and dry.  ?   Comments: Scab 1.5 cm on L forearm-   healing ?Neurological:  ?   Mental Status: She is alert and oriented to person, place, and time.  ?   Comments: L inattention ?Intact to light touch in all 4 extremities ?Dysconjugate gaze, but denies double vision.  ?MAS of 2-3 in LUE_ shoulder MAS of 3; elbow 2-3 and MAS of 2 in L wrist ?MAS 3 L hamstring  ?  ?   ? ? ? ? ?Assessment/Plan: ?1. Functional deficits which require 3+ hours per day of interdisciplinary therapy in a comprehensive inpatient rehab setting. ?Physiatrist is providing close team supervision and 24 hour management of active medical problems listed below. ?Physiatrist and rehab team continue to assess barriers to discharge/monitor patient progress toward functional and medical goals ? ?Care Tool: ? ?Bathing ?   ?Body parts bathed by patient: Left arm, Chest, Abdomen  ? Body parts bathed by helper: Right arm ?  ?  ?Bathing assist Assist Level: Maximal Assistance - Patient 24 - 49% ?  ?  ?Upper Body Dressing/Undressing ?Upper body dressing   ?What is the patient wearing?: Hospital gown only ?   ?Upper body assist Assist Level: Minimal Assistance - Patient > 75% ?   ?Lower Body Dressing/Undressing ?Lower body dressing ? ? ?   ?What is the patient wearing?: Hospital gown only ? ?  ? ?Lower body assist Assist for lower body dressing: Maximal Assistance -  Patient 25 - 49% ?   ? ?Toileting ?Toileting    ?Toileting assist Assist for toileting: Maximal Assistance - Patient 25 - 49% ?  ?  ?Transfers ?Chair/bed transfer ? ?Transfers assist ?   ? ?Chair/bed transfer assist level: Minimal Assistance - Patient > 75% ?  ?  ?Locomotion ?Ambulation ? ? ?Ambulation assist ? ?   ? ?Assist level: Maximal Assistance - Patient 25 - 49% ?Assistive device: Other (comment) ?Max distance: 61'  ? ?Walk 10 feet activity ? ? ?Assist ? Walk 10 feet activity did not occur: Safety/medical concerns ? ?Assist level: Maximal Assistance - Patient 25 - 49% ?Assistive device: Other (comment) (rail)  ? ?Walk 50 feet activity ? ? ?Assist Walk 50 feet with 2 turns activity did not occur: Safety/medical concerns ? ?  ?   ? ? ?Walk 150 feet activity ? ? ?Assist Walk 150 feet activity did not occur: Safety/medical concerns ? ?  ?  ?  ? ?Walk 10 feet on uneven surface  ?activity ? ? ?Assist Walk 10 feet on uneven surfaces activity did not occur:  Safety/medical concerns ? ? ?  ?   ? ?Wheelchair ? ? ? ? ?Assist Is the patient using a wheelchair?: Yes ?Type of Wheelchair: Manual ?  ? ?Wheelchair assist level: Dependent - Patient 0% ?Max wheelchair distance: 162ft  ? ? ?Wheelchair 50 feet with 2 turns activity ? ? ? ?Assist ? ?  ?  ? ? ?Assist Level: Dependent - Patient 0%  ? ?Wheelchair 150 feet activity  ? ? ? ?Assist ?   ? ? ?Assist Level: Dependent - Patient 0%  ? ?Blood pressure 140/78, pulse 61, temperature 97.8 ?F (36.6 ?C), temperature source Oral, resp. rate 15, height 5\' 4"  (1.626 m), weight 66 kg, SpO2 97 %. ? ?Medical Problem List and Plan: ?1. 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            -patient may  shower- cover PEG ?            -ELOS/Goals: 14-16 days min A ? HFU scheduled ? -Continue CIR therapies including PT, OT, and SLP  ? Discussed planned d/c on Wednesday ?2.  Antithrombotics: ?-DVT/anticoagulation:  Pharmaceutical: Other (comment)Eliquis ?            -antiplatelet therapy: none ?3. Shoulder Pain: continue Tylenol prn, Robaxin and Neurotin scheduled, change Lidoderm patch to prn ?4. Depression: team  providing emotional support, appreciate chaplain! ?            --continue sertraline 50 mg daily ? -discussed that this is common after stroke.  ?5. Neuropsych: This patient is capable of making decisions on her own behalf. ?6. S/p PEG: PEG removed 5/5 without complication.  ? -stoma healing nicely ?7. Fluids/Electrolytes/Nutrition: Routine Is and Os and follow-up chemistries ?8: Left-sided spasticity: Primarily hamstring continue baclofen to 10mg  HS. Kpad ordered. Add 5mg  at 2pm. Messaged therapy to provide daughter with stretching exercises. Discussed Botox outpatient.  ?9: Dysphagia: continue dys 2 thin liquid diet, provided education regarding nutritious D2 foods. Goals modI with least restrictive diet. Recommended smoothies and soups for good nutrition at home, and if her daughter can bring in here.   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 Increase magnesium gluconate to 500mg  HS. Continue Cozaar 25mg  daily. Increase klor to daily. Increase baclofen to 15mg  HS ?Vitals:  ? 04/25/22 0000 04/25/22 0500  ?BP: 135/72 140/78  ?Pulse: (!) 59 61  ?  Resp: 16 15  ?Temp:  97.8 ?F (36.6 ?C)  ?SpO2: 97% 97%  ? ? ?12. Constipation: increased magnesium gluconate to 500mg  HS. Provided with list of high fiber foods. Decreased Baclofen to 10mg  HS ?13. Hypokalemia: increase klor to daily ? ?  Latest Ref Rng & Units 04/24/2022  ?  5:16 AM 04/16/2022  ? 11:47 PM 04/13/2022  ? 11:34 AM  ?BMP  ?Glucose 70 - 99 mg/dL 023   94   343    ?BUN 6 - 20 mg/dL 6   7   10     ?Creatinine 0.44 - 1.00 mg/dL 5.68   6.16   8.37    ?Sodium 135 - 145 mmol/L 140   137   138    ?Potassium 3.5 - 5.1 mmol/L 3.7   3.5   3.8    ?Chloride 98 - 111 mmol/L 111   109   107    ?CO2 22 - 32 mmol/L 24   21   22     ?Calcium 8.9 - 10.3 mg/dL 29.0   21.1   15.5    ? ? ?14.  CVA related fatigue, minimize sedating meds  ?15. Left sided inattention, poor visual scanning: continue to encourage turning to left.  ?16. Bradycardia: decrease Coreg to 6.25mg   BID, continue to monitor HR TID.  ?  ? ?LOS: ?21 days ?A FACE TO FACE EVALUATION WAS PERFORMED ? ?Suzzane Quilter P Jaymee Tilson ?04/25/2022, 10:18 AM  ? ?  ?

## 2022-04-25 NOTE — Progress Notes (Addendum)
Patient ID: Sarah Myers, female   DOB: [birth year], 51 y.o.   MRN: [id number]  Pt and daughter were asking if daughter could ride with pt in the ambulance. Contacted [facility] and since COVID no family member can ride in the ambulance. Have contacted [name] to let her know and have let pt know ? ?1:00 PM Ambulance set up for 10:00 daughter is aware along with pt ?

## 2022-04-25 NOTE — Progress Notes (Signed)
Occupational Therapy Discharge Summary ? ?Patient Details  ?Name: Sarah Myers ?MRN: [id number] ?Date of Birth: September 23, 1971 ? ?Today's Date: 04/25/2022 ?OT Individual Time: 1000-1100 ?OT Individual Time Calculation (min): 60 min  ? ?1:1 GRAD DAY self care retraining at shower level. Pt was in bed when arrived and transitioned to EOB with mod A with HOB elevated after reporting needing to go to the bathroom. Min A transfer bed<w/c<toilet with BSC over<w/c <3:1 in the shower<w/c. Pt does required mod facilitation for proper placement of left foot due to increased tone. Pt able to cross left LE over her right knee to enable her to thread brief/ pants. Pt able to come into standing with min A  and maintain standing with min to mod  while assisting with clothing management. Pt performed tooth brushing at sink with min A. Transferred into recliner with min A. Safety measures in place.  ? ? ?Patient has met 7 of 7 long term goals due to improved activity tolerance, improved balance, postural control, and ability to compensate for deficits.  Patient to discharge at overall Mod Assist level.   Pt can bathe at shower level sitting on BSC with cut out with min A (to be able to access buttocks and peri area. Pt can dress UB with min A and LB with mod A without donning AFO/shoe. Pt can perform toilet transfers with min/mod A and mod A for toileting - sitting for hygiene.  Patient's care partner is independent to provide the necessary physical and cognitive assistance at discharge.   ? ?Reasons goals not met: n/a ? ?Recommendation:  ?Patient will benefit from ongoing skilled OT services in home health setting to continue to advance functional skills in the area of BADL and Reduce care partner burden. ? ?Equipment: ?3:1 and TTB ? ?Reasons for discharge: treatment goals met and discharge from hospital ? ?Patient/family agrees with progress made and goals achieved: Yes ? ?OT Discharge ?Precautions/Restrictions   ?Precautions ?Precautions: Fall ?Precaution Comments: L inattention, PEGd/c but covered wound, left hemi, right head gaze ?Restrictions ?Weight Bearing Restrictions: No ?General ?  ?Vital Signs ?  ?Pain ?Pain Assessment ?Pain Scale: 0-10 ?Pain Score: 0-No pain ?ADL ?ADL ?Eating: Supervision/safety ?Grooming: Minimal assistance ?Where Assessed-Grooming: Sitting at sink ?Upper Body Bathing: Minimal assistance ?Where Assessed-Upper Body Bathing: Shower ?Lower Body Bathing: Minimal assistance ?Where Assessed-Lower Body Bathing: Shower ?Upper Body Dressing: Minimal assistance ?Where Assessed-Upper Body Dressing: Wheelchair ?Lower Body Dressing: Maximal assistance ?Where Assessed-Lower Body Dressing: Wheelchair ?Toileting: Moderate assistance ?Where Assessed-Toileting: Toilet ?Toilet Transfer: Moderate assistance ?Toilet Transfer Method: Stand pivot ?Science writer: Grab bars, Drop arm bedside commode ?Walk-In Shower Transfer: Moderate assistance ?Walk-In Shower Transfer Method: Squat pivot ?Vision ?Baseline Vision/History: 1 Wears glasses ?Patient Visual Report: Blurring of vision ?Vision Assessment?: Vision impaired- to be further tested in functional context;Yes ?Ocular Range of Motion: Restricted on the left;Impaired-to be further tested in functional context ?Alignment/Gaze Preference: Gaze right;Head turned;Head tilt ?Tracking/Visual Pursuits: Requires cues, head turns, or add eye shifts to track;Decreased smoothness of vertical tracking;Decreased smoothness of horizontal tracking ?Saccades: Additional head turns occurred during testing;Additional eye shifts occurred during testing;Decreased speed of saccadic movement ?Visual Fields: Left visual field deficit;Impaired-to be further tested in functional context ?Perception  ?Inattention/Neglect: Does not attend to left visual field;Does not attend to left side of body ?Praxis ?Praxis: Impaired ?Praxis Impairment Details: Motor  planning ?Cognition ?Cognition ?Overall Cognitive Status: Impaired/Different from baseline ?Arousal/Alertness: Awake/alert ?Orientation Level: Person;Situation;Place ?Person: Oriented ?Place: Oriented ?Situation: Oriented ?Memory Impairment: Decreased short term  memory;Storage deficit;Retrieval deficit ?Decreased Short Term Memory: Verbal basic;Functional basic ?Attention: Focused;Sustained;Selective ?Sustained Attention: Impaired ?Sustained Attention Impairment: Verbal basic;Functional basic ?Selective Attention: Impaired ?Selective Attention Impairment: Functional basic ?Awareness: Impaired ?Awareness Impairment: Emergent impairment ?Problem Solving: Impaired ?Problem Solving Impairment: Functional basic;Verbal basic ?Executive Function: Decision Making;Organizing;Sequencing ?Sequencing: Impaired ?Sequencing Impairment: Functional basic ?Organizing: Impaired ?Organizing Impairment: Functional basic ?Safety/Judgment: Impaired ?Brief Interview for Mental Status (BIMS) ?Repetition of Three Words (First Attempt): 3 ?Temporal Orientation: Year: Missed by more than 5 years ?Temporal Orientation: Month: Accurate within 5 days ?Temporal Orientation: Day: Correct ?Recall: "Sock": Yes, no cue required ?Recall: "Blue": Yes, after cueing ("a color") ?Recall: "Bed": Yes, no cue required ?BIMS Summary Score: 11 ?Sensation ?Sensation ?Light Touch: Impaired Detail ?Light Touch Impaired Details: Absent LUE;Absent LLE ?Proprioception: Impaired Detail ?Proprioception Impaired Details: Absent LUE;Absent LLE ?Stereognosis: Impaired by gross assessment ?Motor  ?Motor ?Motor: Hemiplegia;Other (comment);Abnormal tone;Abnormal postural alignment and control ?Mobility  ?Transfers ?Sit to Stand: Moderate Assistance - Patient 50-74% ?Stand to Sit: Moderate Assistance - Patient 50-74%  ?Trunk/Postural Assessment  ?Cervical Assessment ?Cervical Assessment:  (head turned to the right and forward flexed) ?Thoracic Assessment ?Thoracic  Assessment:  (forward flexed) ?Lumbar Assessment ?Lumbar Assessment:  (posterior pelvic tilt) ?Postural Control ?Postural Control: Deficits on evaluation ?Righting Reactions: delayed and leans to right; improved midline awareness ?Protective Responses: delayed and inefficient, minimally  ?Balance ?Static Sitting Balance ?Static Sitting - Balance Support: Feet supported ?Static Sitting - Level of Assistance: 5: Stand by assistance ?Dynamic Sitting Balance ?Dynamic Sitting - Balance Support: Feet supported ?Dynamic Sitting - Level of Assistance: 5: Stand by assistance ?Static Standing Balance ?Static Standing - Balance Support: During functional activity ?Static Standing - Level of Assistance: 4: Min assist ?Extremity/Trunk Assessment ?RUE Assessment ?RUE Assessment: Exceptions to [facility] ?General Strength Comments: 4-/5 ?LUE Assessment ?LUE Assessment: Exceptions to [facility] ?Passive Range of Motion (PROM) Comments: Can passively range but pt reports pain with spascity that kicks in ?General Strength Comments: 1/5 proximally; 0/5 distally; hand continues to be swollen and with tone ?LUE Body System: Neuro ?Brunstrum levels for arm and hand: Arm;Hand ?Brunstrum level for arm: Stage I Presynergy ?Brunstrum level for hand: Stage I Flaccidity ?LUE Tone ?LUE Tone: Moderate;Modified Ashworth ?Body Part - Modified Ashworth Scale: Fingers;Thumb ?Fingers - Modified Ashworth Scale for Grading Hypertonia LUE: Slight increase in muscle tone, manifested by a catch, followed by minimal resistance throughout the remainder (less than half) of the ROM ?Thumb - Modified Ashworth Scale for Grading Hypertonia LUE: Slight increase in muscle tone, manifested by a catch, followed by minimal resistance throughout the remainder (less than half) of the ROM ?Modified Ashworth Scale for Grading Hypertonia LUE: More marked increase in muscle tone through most of the ROM, but affected part(s) easily moved ? ? ?Nicoletta Ba ?04/25/2022, 11:23 AM ?

## 2022-04-25 NOTE — Progress Notes (Signed)
Speech Language Pathology Discharge Summary ? ?Patient Details  ?Name: Sarah Myers ?MRN: [id number] ?Date of Birth: 04/08/1971 ? ?Today's Date: 04/25/2022 ?SLP Individual Time: 7829-5621 ?SLP Individual Time Calculation (min): 45 min ? ? ?Skilled Therapeutic Interventions:   ?Pt seen this date for skilled ST intervention targeting cognitive-linguistic goals outlined in care plan. Pt received awake/alert and sitting upright in bed. Pt praying with her daughter on the phone upon arrival. Reports unpleasant experience with nursing staff last evening; reports she updated MD re: incident. Agreeable to ST intervention at bedside. No c/o pain. ? ?SLP facilitated today's session by providing overall Mod-Max A for verbal problem-solving + decision making re: repositioning in bed vs getting OOB to aid in discomfort, and Sup A for verbal sequencing of ADL tasks. Recalled vague events from previous date given Min verbal A. Benefited from [place] to Sup A for intake of nutritional supplementation secondary to poor PO intake with AM meal. Min A for visual scanning to L. Provided education re: safe textures to consume at home, as well as basic cognitive strategies to aid in orientation, attention (sustained and to left visual field), and recall; pt verbalized understanding. SLP called pt's daughter to update her re: handouts provided on appropriate diet textures and cognitive strategies; she verbalized understanding.  ? ?Pt left in bed with bed alarm on, call bell reviewed and within reach, and all immediate needs met. Pt to be discharge from [place] intervention in preparation for hospital discharge on 04/25/2022.  ? ?Patient has met 5 of 5 long term goals.  Patient to discharge at overall Mod;Min level.  ?Reasons goals not met: N/A  ? ?Clinical Impression/Discharge Summary:  ?During CIR admission, pt has demonstrated very slow progress, though has met 5 out of 5 goals since CIR admission due to high level of A set for each goal by time  of discharge.  ? ?Pt continues to present with overall moderate to severe cognitive-linguistic deficits in recall, attention, executive functioning, visuo-spatial skills, and abstract language, as well as mild oral dysphagia with pt benefiting from soft textures. Pt's performance is further compounded by poor L inattention, poor visual recognition of objects/colors, fatigue, poor insight into deficits, and depression/anxiety. Communicates needs best with closed ended and yes/no questions + choice prompts. Pt and family education has been completed re: aspiration precautions, diet modifications, supportive communication interventions, strategies for initiation, recall, and attention. All questions answered.  ? ?Pt will be discharged at a overall Min to Mod-Max A. Given ongoing deficits, pt would benefit from [facility] or OP ST intervention, and will require 24/7 supervision and assistance for safety and  completion of daily activity tasks (ADL and iADL's) post-discharge.  ? ?Care Partner:  ?Caregiver Able to Provide Assistance: Yes  ?Type of Caregiver Assistance: Physical;Cognitive ? ?Recommendation:  ?24 hour supervision/assistance;Outpatient SLP;Home Health SLP  ?Rationale for SLP Follow Up: Maximize cognitive function and independence;Maximize swallowing safety;Reduce caregiver burden  ? ?Equipment: N/A  ? ?Reasons for discharge: Discharged from hospital  ? ?Patient/Family Agrees with Progress Made and Goals Achieved: Yes  ? ? ?Sarah Myers A Monquie Fulgham ?04/25/2022, 12:10 PM ? ?

## 2022-04-26 DIAGNOSIS — I610 Nontraumatic intracerebral hemorrhage in hemisphere, subcortical: Secondary | ICD-10-CM | POA: Diagnosis not present

## 2022-04-26 LAB — GLUCOSE, CAPILLARY
Glucose-Capillary: 125 mg/dL — ABNORMAL HIGH (ref 70–99)
Glucose-Capillary: 109 mg/dL — ABNORMAL HIGH (ref 70–99)

## 2022-04-26 MED ORDER — AMANTADINE HCL 50 MG/5ML PO SOLN: 50.0000 mg | Freq: Two times a day (BID) | ORAL | 0 refills | Status: DC

## 2022-04-26 MED ORDER — HYDRALAZINE HCL 50 MG PO TABS: 50.0000 mg | ORAL_TABLET | Freq: Four times a day (QID) | ORAL | 0 refills | Status: DC

## 2022-04-26 MED ORDER — FAMOTIDINE 20 MG PO TABS: 20.0000 mg | ORAL_TABLET | Freq: Two times a day (BID) | ORAL | 0 refills | Status: DC

## 2022-04-26 MED ORDER — SERTRALINE HCL 50 MG PO TABS: 50.0000 mg | ORAL_TABLET | Freq: Every day | ORAL | 0 refills | Status: DC

## 2022-04-26 MED ORDER — HYDROXYZINE HCL 10 MG PO TABS
10.0000 mg | ORAL_TABLET | Freq: Once | ORAL | Status: DC
  Filled 2022-04-26: qty 1

## 2022-04-26 MED ORDER — POTASSIUM CHLORIDE 20 MEQ PO PACK: 20.0000 meq | PACK | Freq: Every day | ORAL | 0 refills | Status: DC

## 2022-04-26 MED ORDER — LOSARTAN POTASSIUM 25 MG PO TABS: 25.0000 mg | ORAL_TABLET | Freq: Every day | ORAL | 0 refills | Status: DC

## 2022-04-26 MED ORDER — APIXABAN 5 MG PO TABS: 5.0000 mg | ORAL_TABLET | Freq: Two times a day (BID) | ORAL | 0 refills | Status: DC

## 2022-04-26 MED ORDER — BACLOFEN 5 MG PO TABS: 15.0000 mg | ORAL_TABLET | Freq: Every day | ORAL | 0 refills | Status: DC

## 2022-04-26 MED ORDER — ACETAMINOPHEN 325 MG PO TABS: 325.0000 mg | ORAL_TABLET | ORAL | Status: DC | PRN

## 2022-04-26 MED ORDER — CARVEDILOL 6.25 MG PO TABS: 6.2500 mg | ORAL_TABLET | Freq: Two times a day (BID) | ORAL | 0 refills | Status: DC

## 2022-04-26 MED ORDER — AMANTADINE HCL 100 MG PO CAPS: 100.0000 mg | ORAL_CAPSULE | Freq: Two times a day (BID) | ORAL | 0 refills | Status: DC

## 2022-04-26 MED ORDER — MAGNESIUM GLUCONATE 500 MG PO TABS: 500.0000 mg | ORAL_TABLET | Freq: Every day | ORAL | 0 refills | Status: DC

## 2022-04-26 MED ORDER — ADULT MULTIVITAMIN W/MINERALS CH: 1.0000 | ORAL_TABLET | Freq: Every day | ORAL | Status: DC

## 2022-04-26 MED ORDER — GABAPENTIN 100 MG PO CAPS
100.0000 mg | ORAL_CAPSULE | Freq: Two times a day (BID) | ORAL | 0 refills | Status: DC
Start: 2022-04-26 — End: 2022-05-22

## 2022-04-26 MED ORDER — AMLODIPINE BESYLATE 10 MG PO TABS: 10.0000 mg | ORAL_TABLET | Freq: Every day | ORAL | 0 refills | Status: DC

## 2022-04-26 NOTE — Progress Notes (Signed)
?                                                       PROGRESS NOTE ? ? ?Subjective/Complaints: ?Anxious about ambulance ride today- 10mg  hydroxyzine ordered to help with her anxiety. ?Discussed with nursing applying silver nitrate to stoma site ? ?ROS: Denies shoulder pain, +anxiety, denies pain from PEG site ? ?Objective: ?  ?No results found. ?Recent Labs  ?  04/24/22 ?[id number]  ?WBC 3.4*  ?HGB 13.6  ?HCT 41.1  ?PLT 200  ? ? ? ? ?Recent Labs  ?  04/24/22 ?[id number]  ?NA 140  ?K 3.7  ?CL 111  ?CO2 24  ?GLUCOSE 100*  ?BUN 6  ?CREATININE 0.89  ?CALCIUM 11.2*  ? ? ? ? ?Intake/Output Summary (Last 24 hours) at 04/26/2022 [id number] ?Last data filed at 04/26/2022 [id number] ?Gross per 24 hour  ?Intake 377 ml  ?Output --  ?Net 377 ml  ? ?  ? ?  ? ?Physical Exam: ?Vital Signs ?Blood pressure (!) 141/72, pulse 70, temperature 98.2 ?F (36.8 ?C), temperature source Oral, resp. rate 18, height 5\' 4"  (1.626 m), weight 66 kg, SpO2 98 %. ? ?Constitutional: No distress . Vital signs reviewed. BMI 24.98 ?HEENT: NCAT, EOMI, oral membranes moist ?Neck: supple ?Cardiovascular: RRR without murmur. No JVD    ?Respiratory/Chest: CTA Bilaterally without wheezes or rales. Normal effort    ?GI/Abdomen: BS +, non-tender, non-distended. PEG stoma dry, granulating in ?Ext: no clubbing, cyanosis, or edema ?Psych: anxious, excited about going home today ?Musculoskeletal: Patch on L shoulder ?   Cervical back: Neck supple. No tenderness.  ?   Comments: RUE- 4/5 or more- hard to test ?RLE 3+/5 proximally and 4-/5 distally ?LUE- 0/5 ?LLE- 2-/5 in HF and PF only ?Using RW for sit to stand ?  ?Skin: ?   General: Skin is warm and dry.  ?   Comments: Scab 1.5 cm on L forearm-   healing ?Neurological:  ?   Mental Status: She is alert and oriented to person, place, and time.  ?   Comments: L inattention ?Intact to light touch in all 4 extremities ?Dysconjugate gaze, but denies double vision.  ?MAS of 2-3 in LUE_ shoulder MAS of 3; elbow 2-3 and MAS of 2 in L wrist ?MAS 3  L hamstring  ?  ?  ? ? ? ? ?Assessment/Plan: ?1. Functional deficits which require 3+ hours per day of interdisciplinary therapy in a comprehensive inpatient rehab setting. ?Physiatrist is providing close team supervision and 24 hour management of active medical problems listed below. ?Physiatrist and rehab team continue to assess barriers to discharge/monitor patient progress toward functional and medical goals ? ?Care Tool: ? ?Bathing ?   ?Body parts bathed by patient: Left arm, Chest, Abdomen, Front perineal area, Buttocks, Right upper leg, Right lower leg, Left upper leg, Left lower leg, Face  ? Body parts bathed by helper: Right arm ?  ?  ?Bathing assist Assist Level: Minimal Assistance - Patient > 75% ?  ?  ?Upper Body Dressing/Undressing ?Upper body dressing   ?What is the patient wearing?: Pull over shirt ?   ?Upper body assist Assist Level: Minimal Assistance - Patient > 75% ?   ?Lower Body Dressing/Undressing ?Lower body dressing ? ? ?   ?What is the patient wearing?: Incontinence brief, Pants ? ?  ? ?  Lower body assist Assist for lower body dressing: Moderate Assistance - Patient 50 - 74% ?   ? ?Toileting ?Toileting    ?Toileting assist Assist for toileting: Moderate Assistance - Patient 50 - 74% ?  ?  ?Transfers ?Chair/bed transfer ? ?Transfers assist ?   ? ?Chair/bed transfer assist level: Minimal Assistance - Patient > 75% ?  ?  ?Locomotion ?Ambulation ? ? ?Ambulation assist ? ?   ? ?Assist level: Maximal Assistance - Patient 25 - 49% ?Assistive device: Other (comment) ?Max distance: 56'  ? ?Walk 10 feet activity ? ? ?Assist ? Walk 10 feet activity did not occur: Safety/medical concerns ? ?Assist level: Maximal Assistance - Patient 25 - 49% ?Assistive device: Other (comment) (rail)  ? ?Walk 50 feet activity ? ? ?Assist Walk 50 feet with 2 turns activity did not occur: Safety/medical concerns ? ?  ?   ? ? ?Walk 150 feet activity ? ? ?Assist Walk 150 feet activity did not occur: Safety/medical  concerns ? ?  ?  ?  ? ?Walk 10 feet on uneven surface  ?activity ? ? ?Assist Walk 10 feet on uneven surfaces activity did not occur: Safety/medical concerns ? ? ?  ?   ? ?Wheelchair ? ? ? ? ?Assist Is the patient using a wheelchair?: Yes ?Type of Wheelchair: Manual ?  ? ?Wheelchair assist level: Dependent - Patient 0% ?Max wheelchair distance: 179ft  ? ? ?Wheelchair 50 feet with 2 turns activity ? ? ? ?Assist ? ?  ?  ? ? ?Assist Level: Dependent - Patient 0%  ? ?Wheelchair 150 feet activity  ? ? ? ?Assist ?   ? ? ?Assist Level: Dependent - Patient 0%  ? ?Blood pressure (!) 141/72, pulse 70, temperature 98.2 ?F (36.8 ?C), temperature source Oral, resp. rate 18, height 5\' 4"  (1.626 m), weight 66 kg, SpO2 98 %. ? ?Medical Problem List and Plan: ?1. 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            -patient may  shower- cover PEG ?            -ELOS/Goals: 14-16 days min A ? HFU scheduled ? D/c home today ?2.  Antithrombotics: ?-DVT/anticoagulation:  Pharmaceutical: Other (comment)Eliquis ?            -antiplatelet therapy: none ?3. Shoulder Pain: continue Tylenol prn, Robaxin and Neurotin scheduled, change Lidoderm patch to prn ?4. Depression: team  providing emotional support, appreciate chaplain! ?            --continue sertraline 50 mg daily ? -discussed that this is common after stroke.  ?5. Neuropsych: This patient is capable of making decisions on her own behalf. ?6. S/p PEG: PEG removed 5/5 without complication.  ? -stoma healing nicely ? -continue to apply silver nitrate to stoma site ?7. Fluids/Electrolytes/Nutrition: Routine Is and Os and follow-up chemistries ?8: Left-sided spasticity: Primarily hamstring continue baclofen to 10mg  HS. Kpad ordered. Add 5mg  at 2pm. Messaged therapy to provide daughter with stretching exercises. Discussed Botox outpatient.  ?9: Dysphagia: continue dys 2 thin liquid diet, provided education regarding nutritious D2 foods. Goals modI with least  restrictive diet. Recommended smoothies and soups for good nutrition at home, and if her daughter can bring in here.  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 Increase magnesium gluconate to 500mg  HS. Continue Cozaar 25mg  daily. Increase klor to 28meq daily. Increase baclofen to 15mg  HS ?Vitals:  ? 04/25/22 1922 04/26/22 0447  ?  BP: (!) 146/78 (!) 141/72  ?Pulse: 63 70  ?Resp: 18 18  ?Temp: 98.2 ?F (36.8 ?C) 98.2 ?F (36.8 ?C)  ?SpO2: 98%   ? ? ?12. Constipation: increased magnesium gluconate to 500mg  HS. Provided with list of high fiber foods. Decreased Baclofen to 10mg  HS ?13. Hypokalemia: increase klor to 7meq daily ? ?  Latest Ref Rng & Units 04/24/2022  ?  5:16 AM 04/16/2022  ? 11:47 PM 04/13/2022  ? 11:34 AM  ?BMP  ?Glucose 70 - 99 mg/dL 100   94   119    ?BUN 6 - 20 mg/dL 6   7   10     ?Creatinine 0.44 - 1.00 mg/dL 0.89   0.83   0.92    ?Sodium 135 - 145 mmol/L 140   137   138    ?Potassium 3.5 - 5.1 mmol/L 3.7   3.5   3.8    ?Chloride 98 - 111 mmol/L 111   109   107    ?CO2 22 - 32 mmol/L 24   21   22     ?Calcium 8.9 - 10.3 mg/dL 11.2   10.8   11.1    ? ? ?14.  CVA related fatigue, minimize sedating meds  ?15. Left sided inattention, poor visual scanning: continue to encourage turning to left.  ?16. Bradycardia: decrease Coreg to 6.25mg   BID, continue to monitor HR TID.  ?  ? >30 minutes spent in discharge of patient including review of medications and follow-up appointments, physical examination, and in answering all patient's questions  ? ?LOS: ?22 days ?A FACE TO FACE EVALUATION WAS PERFORMED ? ?Sarah Myers ?04/26/2022, 9:22 AM  ? ?  ?

## 2022-04-26 NOTE — Plan of Care (Signed)
?  Problem: RH Balance ?Goal: LTG Patient will maintain dynamic sitting balance (PT) ?Description: LTG:  Patient will maintain dynamic sitting balance with assistance during mobility activities (PT) ?Outcome: Completed/Met ?Flowsheets (Taken 04/26/2022 1137) ?LTG: Pt will maintain dynamic sitting balance during mobility activities with:: Contact Guard/Touching assist ?Goal: LTG Patient will maintain dynamic standing balance (PT) ?Description: LTG:  Patient will maintain dynamic standing balance with assistance during mobility activities (PT) ?Outcome: Completed/Met ?Flowsheets (Taken 04/05/2022 1538 by Ginnie Smart P, PT) ?LTG: Pt will maintain dynamic standing balance during mobility activities with:: Minimal Assistance - Patient > 75% ?  ?Problem: Sit to Stand ?Goal: LTG:  Patient will perform sit to stand with assistance level (PT) ?Description: LTG:  Patient will perform sit to stand with assistance level (PT) ?Outcome: Completed/Met ?Flowsheets (Taken 04/26/2022 1137) ?LTG: PT will perform sit to stand in preparation for functional mobility with assistance level: ? Contact Guard/Touching assist ? Minimal Assistance - Patient > 75% ?  ?Problem: RH Bed Mobility ?Goal: LTG Patient will perform bed mobility with assist (PT) ?Description: LTG: Patient will perform bed mobility with assistance, with/without cues (PT). ?Outcome: Completed/Met ?Flowsheets (Taken 04/05/2022 1538 by Ginnie Smart P, PT) ?LTG: Pt will perform bed mobility with assistance level of: Minimal Assistance - Patient > 75% ?  ?Problem: RH Car Transfers ?Goal: LTG Patient will perform car transfers with assist (PT) ?Description: LTG: Patient will perform car transfers with assistance (PT). ?Outcome: Completed/Met ?Flowsheets (Taken 04/26/2022 1137) ?LTG: Pt will perform car transfers with assist:: Minimal Assistance - Patient > 75% ?  ?Problem: RH Ambulation ?Goal: LTG Patient will ambulate in controlled environment (PT) ?Description: LTG:  Patient will ambulate in a controlled environment, # of feet with assistance (PT). ?Outcome: Completed/Met ?Flowsheets (Taken 04/26/2022 1137) ?LTG: Pt will ambulate in controlled environ  assist needed:: Moderate Assistance - Patient 50 - 74% ?LTG: Ambulation distance in controlled environment: 59f using HW along wall ?  ?Problem: RH Wheelchair Mobility ?Goal: LTG Patient will propel w/c in controlled environment (PT) ?Description: LTG: Patient will propel wheelchair in controlled environment, # of feet with assist (PT) ?Outcome: Completed/Met ?Flowsheets (Taken 04/19/2022 0728 by MGinnie SmartP, PT) ?LTG: Pt will propel w/c in controlled environ  assist needed:: Maximal Assistance - Patient 25 - 49% ?LTG: Propel w/c distance in controlled environment: 21f?Goal: LTG Patient will propel w/c in home environment (PT) ?Description: LTG: Patient will propel wheelchair in home environment, # of feet with assistance (PT). ?Outcome: Completed/Met ?Flowsheets (Taken 04/19/2022 0728 by MaGinnie Smart, PT) ?LTG: Pt will propel w/c in home environ  assist needed:: Maximal Assistance - Patient 25 - 49% ?LTG: Propel w/c distance in home environment: 1035f  ?

## 2022-04-26 NOTE — Progress Notes (Signed)
Inpatient Rehabilitation Care Coordinator ?Discharge Note  ? ?Patient Details  ?Name: Sarah Myers ?MRN: [id number] ?Date of Birth: [birth year] ? ? ?Discharge location: HOME WITH DAUGHTER WHO WILL PROVIDE 24/7 CARE ? ?Length of Stay: 22 DAYS ? ?Discharge activity level: MOD ASSIST WHEELCHAIR LEVEL ? ?Home/community participation: ACTIVE ? ?Patient response VX:BLTJQZ Literacy - How often do you need to have someone help you when you read instructions, pamphlets, or other written material from your doctor or pharmacy?: Rarely ? ?Patient response ES:PQZRAQ Isolation - How often do you feel lonely or isolated from those around you?: Never ? ?Services provided included: RD, MD, PT, OT, SLP, RN, CM, Pharmacy, Neuropsych, SW, TR ? ?Financial Services:  ?Field seismologist Utilized: Private Insurance ?[facility] ? ?Choices offered to/list presented to: PT AND DAUGHTER ? ?Follow-up services arranged:  ?Home Health, DME, Patient/Family has no preference for HH/DME agencies ?Home Health Agency: [facility]-PT, OT SP RN AIDE  ?  ?DME : ROTECH WHEELCHAIR, HOSPITAL BED, DROP ARM BEDSIDE COMMODE WILL GET TUB BENCH ON OWN ?  ? ?Patient response to transportation need: ?Is the patient able to respond to transportation needs?: Yes ?In the past 12 months, has lack of transportation kept you from medical appointments or from getting medications?: No ?In the past 12 months, has lack of transportation kept you from meetings, work, or from getting things needed for daily living?: No ? ? ? ?Comments (or additional information):DAUGHTER WAS HERE DAILY AND DID HANDS ON CARE WITH PT. DAUGHTER'S AWARE SHE WILL REQUIRE 24/7 CARE AT HOME. THEY NEED TO APPLY FOR SSD AND MEDICAID ? ?Patient/Family verbalized understanding of follow-up arrangements:  Yes ? ?Individual responsible for coordination of the follow-up plan: Sarah Myers-DAUGHTER ? ?Confirmed correct DME delivered: Sarah Myers 04/26/2022   ? ?Sarah Myers ?

## 2022-04-26 NOTE — Progress Notes (Signed)
Physical Therapy Discharge Summary ? ?Patient Details  ?Name: Sarah Myers ?MRN: [id number] ?Date of Birth: 1971/12/03 ? ?Today's Date: 04/26/2022 ?PT Individual Time: 4650-3546 ?PT Individual Time Calculation (min): 59 min  ? ? ?Patient has met 8 of 8 long term goals due to improved activity tolerance, increased strength, ability to compensate for deficits, functional use of  left lower extremity, improved attention, and improved awareness.  Patient to discharge at a wheelchair level [place].   Patient's care partner is independent to provide the necessary physical and cognitive assistance at discharge. ? ?Reasons goals not met: n/a, however original goals downgraded d/t limited progress 2/2 increased R sided tone ? ?Recommendation:  ?Patient will benefit from ongoing skilled PT services in home health setting to continue to advance safe functional mobility, address ongoing impairments in strength, coordination, balance, activity tolerance, cognition, safety awareness, and to minimize fall risk. ? ?Equipment: ?W/c, HW, hospital bed, L AFO with toe cap on shoe ? ?Reasons for discharge: treatment goals met and discharge from hospital ? ?Patient/family agrees with progress made and goals achieved: Yes ? ?PT Discharge ?Precautions/Restrictions ?Precautions ?Precautions: Fall ?Precaution Comments: L inattention, PEGd/c but covered wound, left hemi, right head gaze ?Restrictions ?Weight Bearing Restrictions: No ?Vital Signs ?Therapy Vitals ?Temp: 98.2 ?F (36.8 ?C) ?Temp Source: Oral ?Pulse Rate: 70 ?Resp: 18 ?BP: (!) 141/72 ?Patient Position (if appropriate): Lying ?Pain ?Pain Assessment ?Pain Scale: 0-10 ?Pain Score: 0-No pain ?Pain Interference ?Pain Interference ?Pain Effect on Sleep: 1. Rarely or not at all ?Pain Interference with Therapy Activities: 1. Rarely or not at all ?Pain Interference with Day-to-Day Activities: 1. Rarely or not at all ?Vision/Perception  ?Vision - History ?Ability to See in Adequate  Light: 2 Moderately impaired ?Vision - Assessment ?Eye Alignment: Impaired (comment) ?Ocular Range of Motion: Restricted on the left;Impaired-to be further tested in functional context ?Alignment/Gaze Preference: Gaze right;Head turned;Head tilt ?Tracking/Visual Pursuits: Requires cues, head turns, or add eye shifts to track;Decreased smoothness of vertical tracking;Decreased smoothness of horizontal tracking ?Saccades: Additional head turns occurred during testing;Additional eye shifts occurred during testing;Decreased speed of saccadic movement ?Convergence: Impaired (comment) ?Perception ?Perception: Impaired ?Inattention/Neglect: Does not attend to left visual field;Does not attend to left side of body ?Praxis ?Praxis: Impaired ?Praxis Impairment Details: Motor planning ?Praxis-Other Comments: improved from eval but deficits remain  ?Cognition ?Overall Cognitive Status: Impaired/Different from baseline ?Arousal/Alertness: Awake/alert ?Orientation Level: Oriented X4 ?Sustained Attention: Impaired ?Selective Attention: Impaired ?Selective Attention Impairment: Functional basic ?Memory: Impaired ?Memory Impairment: Decreased short term memory;Storage deficit;Retrieval deficit ?Awareness: Impaired ?Problem Solving: Impaired ?Executive Function: Decision Making;Organizing;Sequencing ?Sequencing: Impaired ?Organizing: Impaired ?Decision Making: Impaired ?Safety/Judgment: Impaired ?Sensation ?Sensation ?Light Touch: Impaired Detail ?Light Touch Impaired Details: Absent LUE;Absent LLE ?Coordination ?Gross Motor Movements are Fluid and Coordinated: No ?Fine Motor Movements are Fluid and Coordinated: No ?Heel Shin Test: unable with LLE, undershoots on RLE ?Motor  ?Motor ?Motor: Other (comment);Abnormal tone;Abnormal postural alignment and control (hemipareisis) ?Motor - Discharge Observations: continued impairment with coordinationvoluntary movement of LLE; significant tone in LUE  ?Mobility ?Bed Mobility ?Bed Mobility:  Sit to Supine;Rolling Left;Left Sidelying to Sit;Supine to Sit ?Rolling Left: Minimal Assistance - Patient > 75% ?Left Sidelying to Sit: Minimal Assistance - Patient >75% ?Supine to Sit: Moderate Assistance - Patient 50-74% ?Sit to Supine: Minimal Assistance - Patient > 75%;Contact Guard/Touching assist ?Transfers ?Transfers: Squat Pivot Transfers;Stand to Sit;Sit to Stand ?Sit to Stand: Minimal Assistance - Patient > 75% ?Stand to Sit: Minimal Assistance - Patient > 75% ?Squat Pivot Transfers: Contact  Guard/Touching assist;Minimal Assistance - Patient > 75% ?Transfer (Assistive device):  (armrest/ handrail) ?Locomotion  ?Gait ?Ambulation: Yes ?Gait Assistance: Moderate Assistance - Patient 50-74%;Minimal Assistance - Patient > 75% ?Assistive device: Hemi-walker ?Gait Assistance Details: Tactile cues for initiation;Tactile cues for sequencing;Tactile cues for weight shifting;Tactile cues for posture;Tactile cues for placement;Tactile cues for weight beaing;Verbal cues for sequencing;Verbal cues for technique;Verbal cues for precautions/safety;Verbal cues for gait pattern;Verbal cues for safe use of DME/AE;Manual facilitation for placement;Manual facilitation for weight bearing ?Gait ?Gait: Yes ?Gait Pattern: Impaired ?Gait Pattern: Step-to pattern;Decreased step length - left;Decreased step length - right;Decreased stance time - left;Decreased hip/knee flexion - left;Decreased dorsiflexion - left;Decreased weight shift to left;Left flexed knee in stance;Lateral trunk lean to right;Trunk flexed;Poor foot clearance - left ?Stairs / Additional Locomotion ?Stairs: No ?Wheelchair Mobility ?Wheelchair Mobility: Yes ?Wheelchair Assistance: Maximal Assistance - Patient 25 - 49% ?Wheelchair Propulsion: Left lower extremity;Left upper extremity ?Wheelchair Parts Management: Needs assistance  ?Trunk/Postural Assessment  ?Cervical Assessment ?Cervical Assessment: Exceptions to [facility] (head turned to the right and forward  flexed) ?Thoracic Assessment ?Thoracic Assessment: Exceptions to [facility] (forward flexed) ?Lumbar Assessment ?Lumbar Assessment: Exceptions to [facility] (posterior pelvic tilt) ?Postural Control ?Postural Control: Deficits on evaluation ?Righting Reactions: delayed and leans to right; improved midline awareness ?Protective Responses: delayed and inefficient, minimally  ?Balance ?Static Sitting Balance ?Static Sitting - Balance Support: Feet supported ?Static Sitting - Level of Assistance: 5: Stand by assistance ?Dynamic Sitting Balance ?Dynamic Sitting - Balance Support: Feet supported ?Dynamic Sitting - Level of Assistance: 5: Stand by assistance ?Static Standing Balance ?Static Standing - Balance Support: During functional activity;Right upper extremity supported ?Static Standing - Level of Assistance: 4: Min assist (CGA/ MiNA) ?Dynamic Standing Balance ?Dynamic Standing - Balance Support: Right upper extremity supported ?Dynamic Standing - Level of Assistance: 3: Mod assist ?Dynamic Standing - Balance Activities: Lateral lean/weight shifting;Reaching across midline ?Extremity Assessment  ?  ?  ?RLE Assessment ?RLE Assessment: Exceptions to [facility] ?General Strength Comments: Grossly 4/5 ?LLE Assessment ?LLE Assessment: Exceptions to [facility] ?LLE Strength ?LLE Overall Strength: Deficits ?Left Hip Flexion: 3-/5 ?Left Hip Extension: 3-/5 ?Left Hip ABduction: 2+/5 ?Left Hip ADduction: 2+/5 ?Left Knee Flexion: 3-/5 (difficult to test d/t increased tone) ?Left Knee Extension: 3-/5 ?Left Ankle Dorsiflexion: 0/5 ?Left Ankle Plantar Flexion: 2-/5 ? ?Skilled interventions ?Patient supine in bed on entrance to room. Patient alert and agreeable to PT session.  ? ?Patient with no pain complaint throughout session. Continues to demo R head turn. During session, when cued, pt is able to produce L eye turn bilaterally without and with head turn. Pt relates that sometimes this can produce headache pain but no pain related today.  ? ?Therapeutic  Activity: ?Bed Mobility: Patient performed supine --> sit with MinA to push into upright sitting. Sitting balance much improved from eval with no LOB. VC/ tc required for technique and effort throughout. For return

## 2022-04-28 ENCOUNTER — Telehealth: Payer: Self-pay

## 2022-04-28 ENCOUNTER — Encounter: Payer: Self-pay | Admitting: Physical Medicine and Rehabilitation

## 2022-04-28 ENCOUNTER — Telehealth: Payer: Self-pay | Admitting: *Deleted

## 2022-04-28 NOTE — Telephone Encounter (Signed)
Transitional Care call--who you talked with [name]- Daughter ? ? ? ?Are you/is patient experiencing any problems since coming home? NO Are there any questions regarding any aspect of care? NO ?Are there any questions regarding medications administration/dosing? NO Are meds being taken as prescribed? YES Patient should review meds with caller to confirm ?Have there been any falls? NO ?Has Home Health been to the house and/or have they contacted you? YES If not, have you tried to contact them? Can we help you contact them? ?Are bowels and bladder emptying properly? YES Are there any unexpected incontinence issues? If applicable, is patient following bowel/bladder programs? ?Any fevers, problems with breathing, unexpected pain? NO ?Are there any skin problems or new areas of breakdown? NO ?Has the patient/family member arranged specialty MD follow up (ie cardiology/neurology/renal/surgical/etc)? DAUGHTER IS WORKING ON SCHEDULING APPOINTMENTS WITH CARDIOLOGY AND [facility]  Can we help arrange? ?Does the patient need any other services or support that we can help arrange? NO ?Are caregivers following through as expected in assisting the patient? YES ?Has the patient quit smoking, drinking alcohol, or using drugs as recommended? YES ? ?Appointment time, arrive time and who it is with here 05/02/22 at 3 pm 2:40 pm arrival with Dr. Ranell Patrick ?[place]

## 2022-04-28 NOTE — Telephone Encounter (Signed)
PA for Baclofen submitted. ?

## 2022-04-28 NOTE — Telephone Encounter (Signed)
Donita RN called for [facility] POC for SN 1wk9 and a visit from a MSW. Approval given. ?

## 2022-05-01 IMAGING — CT CT ABD-PELV W/ CM
2 of 5 series · 16 of 46 positions shown, 18 images · IV contrast (APPLIED)
Comparison: 03/02/2022

CLINICAL DATA: Peritonitis, abdominal distension, visceral
perforation

EXAM:
CT ABDOMEN AND PELVIS WITH CONTRAST
TECHNIQUE: Multidetector CT imaging of the abdomen and pelvis was performed
using the standard protocol following bolus administration of
intravenous contrast.

[Series 3: abdomen 5.0 · axial · 0.88mm/px · z∈[+658,+1063]mm · 13 of 93 slices shown, 15 images]
[im 6/93  soft-tissue]
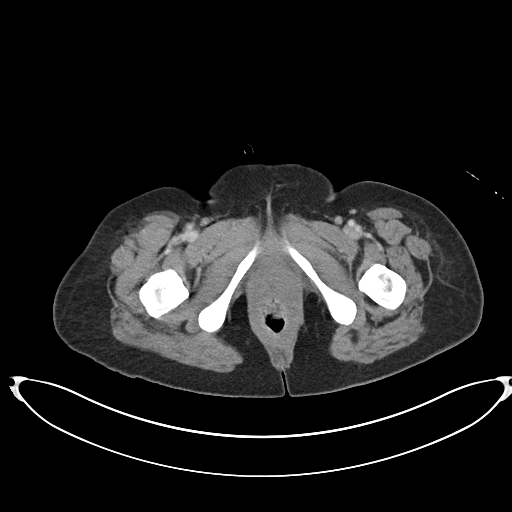
[im 6/93  bone]
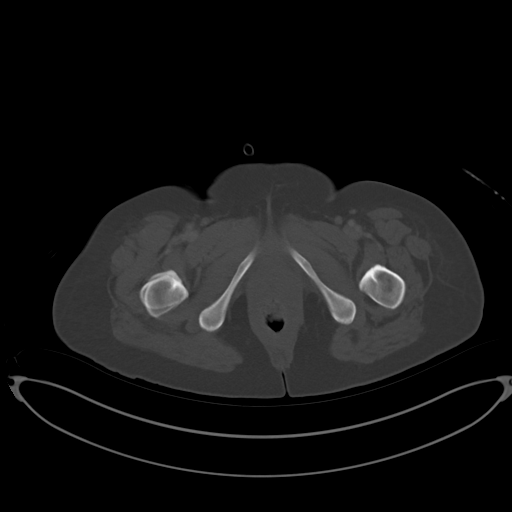
[im 12/93  soft-tissue]
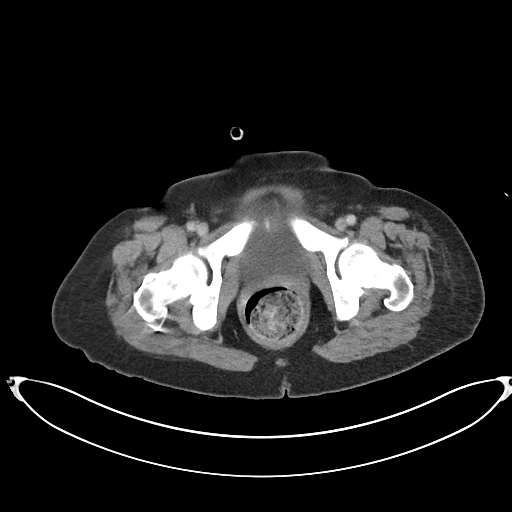
[im 18/93  soft-tissue]
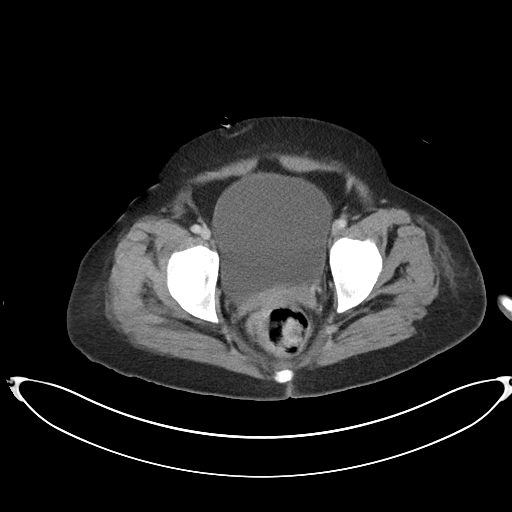
[im 29/93  soft-tissue]
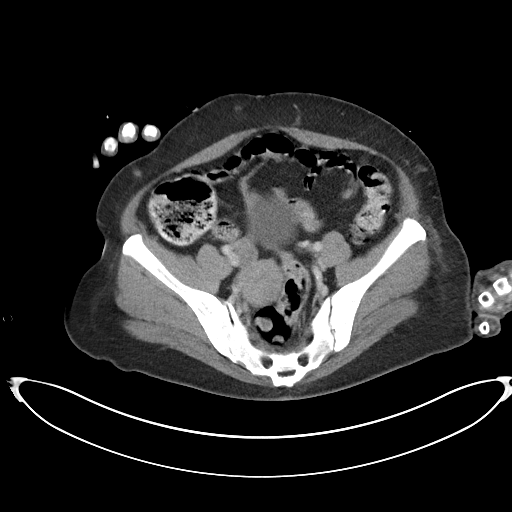
[im 35/93  soft-tissue]
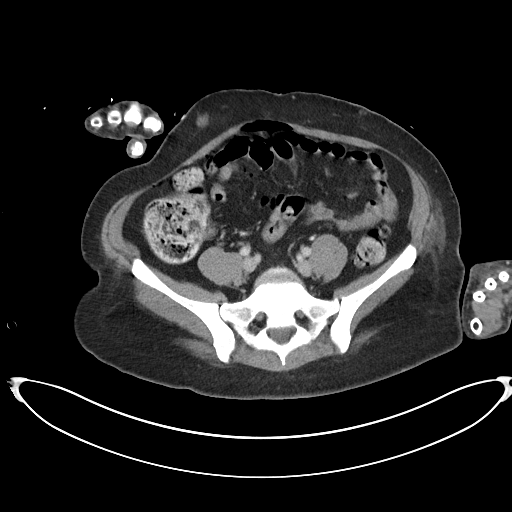
[im 41/93  soft-tissue]
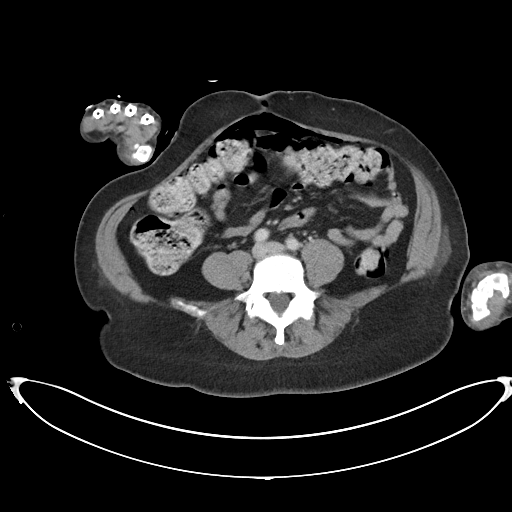
[im 47/93  soft-tissue]
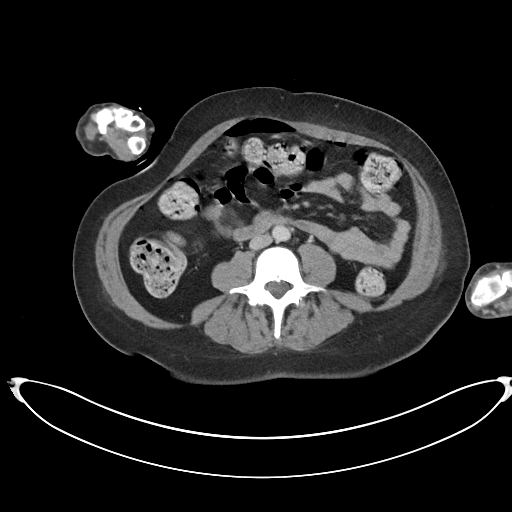
[im 52/93  soft-tissue]
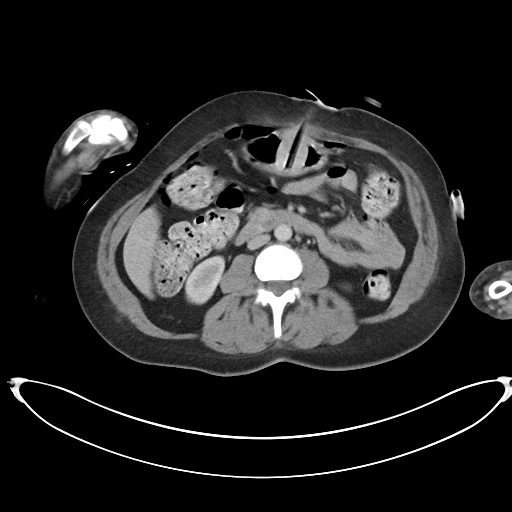
[im 58/93  soft-tissue]
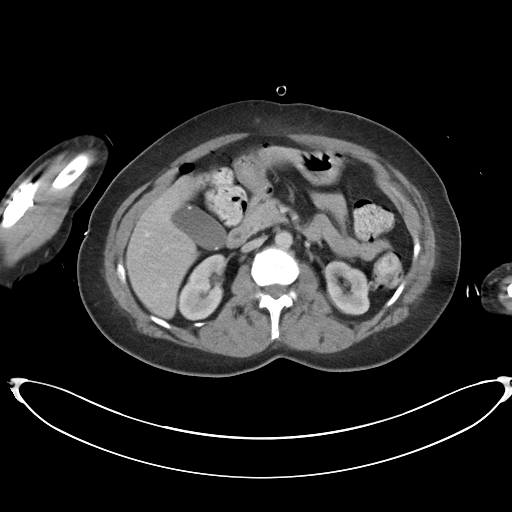
[im 58/93  bone]
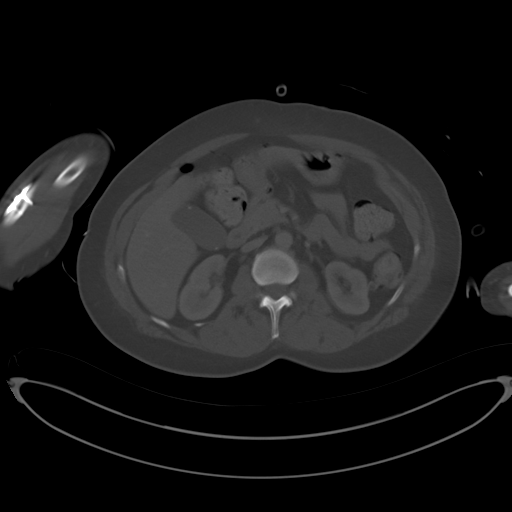
[im 64/93  soft-tissue]
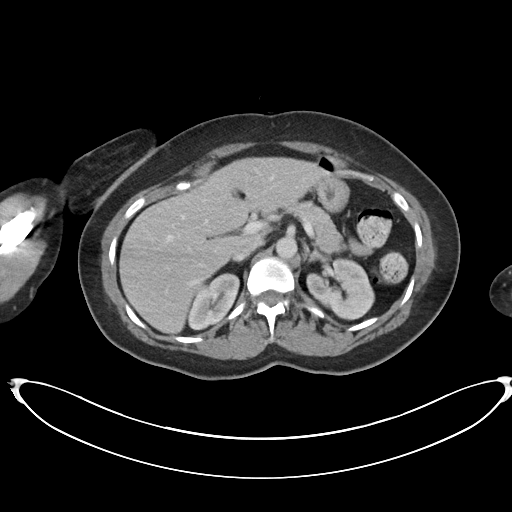
[im 75/93  soft-tissue]
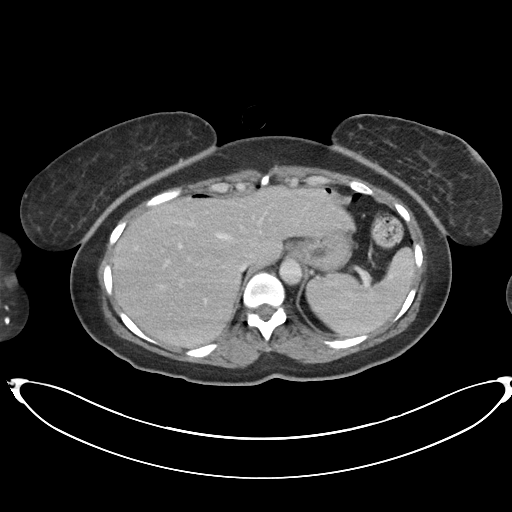
[im 81/93  soft-tissue]
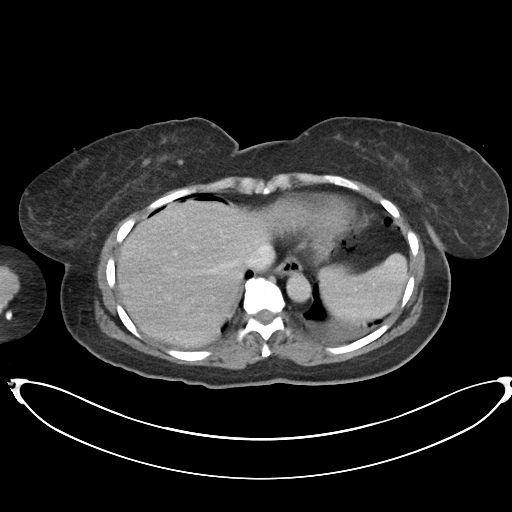
[im 87/93  soft-tissue]
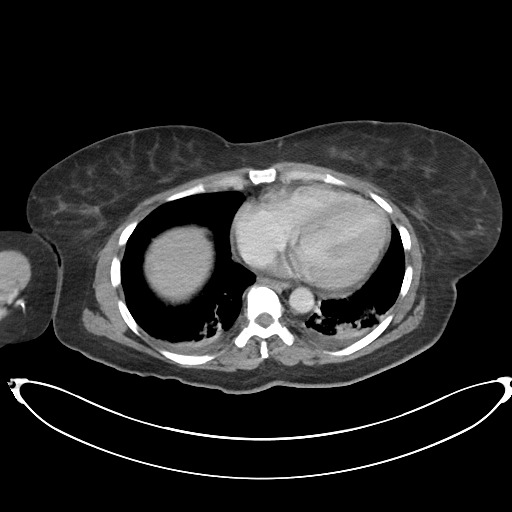

[Series 7: abdomen 3.0 mpr cor · coronal · 0.90mm/px · 3 of 94 slices shown]
[im 32/94  soft-tissue]
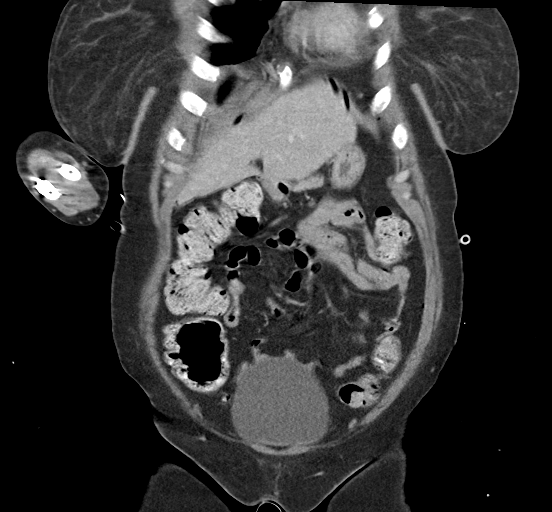
[im 42/94  soft-tissue]
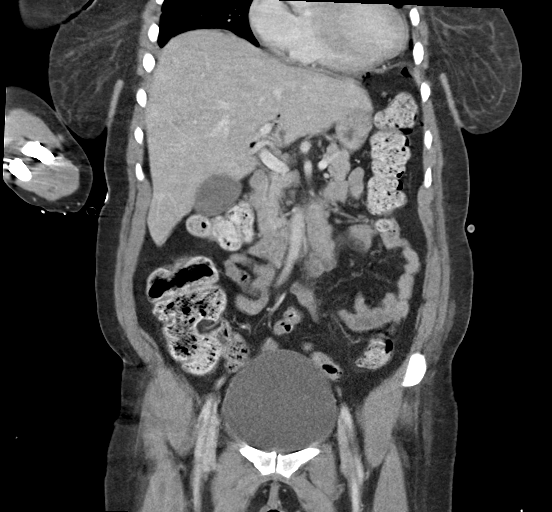
[im 52/94  soft-tissue]
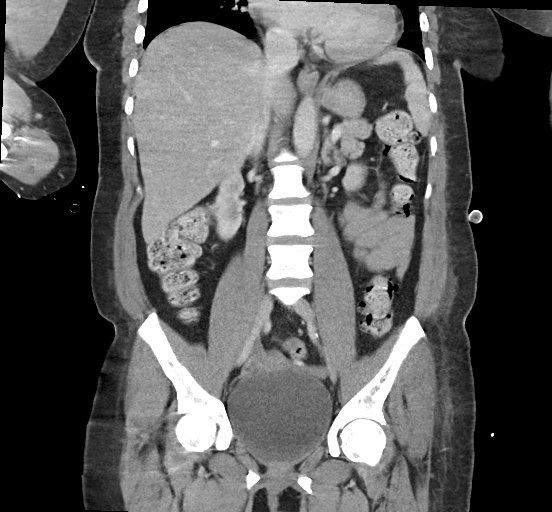

[16 of 46 positions shown; findings below may reference images not displayed]

RADIATION DOSE REDUCTION: This exam was performed according to the
departmental dose-optimization program which includes automated
exposure control, adjustment of the mA and/or kV according to
patient size and/or use of iterative reconstruction technique.

CONTRAST:  100mL OMNIPAQUE IOHEXOL 300 MG/ML  SOLN
FINDINGS: Lower chest: Trace bilateral pleural effusions and mild bibasilar
atelectasis is stable. Cardiac size within normal limits.

Hepatobiliary: Cholelithiasis without pericholecystic inflammatory
change. No intra or extrahepatic biliary ductal dilation. Liver
unremarkable.

Pancreas: Unremarkable

Spleen: Unremarkable

Adrenals/Urinary Tract: Adrenal glands are unremarkable. Kidneys are
normal, without renal calculi, focal lesion, or hydronephrosis.
Bladder is unremarkable.

Stomach/Bowel: There is small volume free intraperitoneal gas.
Interval placement of a balloon retention gastrostomy catheter in
expected position with lumen of the distal body of the stomach. Two
metallic T tacks are in expected position and the anterior gastric
wall appears fixed to the anterior abdominal wall. No inadvertent
small or large bowel puncture identified. No free intraperitoneal
fluid or loculated intra-abdominal fluid collections. No evidence of
obstruction. Moderate stool is seen throughout the colon and within
the rectal vault. The stomach, small bowel, and large bowel are
otherwise unremarkable. Appendix normal.

Vascular/Lymphatic: Aortic atherosclerosis. No enlarged abdominal or
pelvic lymph nodes.

Reproductive: Uterus and bilateral adnexa are unremarkable.

Other: Gas noted within a tiny umbilical hernia. Mild infiltration
of the anterior abdominal wall surrounding the gastrostomy catheter
site likely postprocedural in nature.

Musculoskeletal: No acute bone abnormality. No lytic or blastic bone
lesion.
IMPRESSION: Interval percutaneous gastrostomy placement with balloon retention
gastrostomy catheter in expected position within the distal body of
the stomach. No radiographic evidence of imaged vert and bowel
perforation. Mild free intraperitoneal gas is within range of
expectation immediately following gastrostomy catheter placement. No
free intraperitoneal fluid or loculated intra-abdominal fluid
collections.

Cholelithiasis.

Stable trace bilateral pleural effusions and bibasilar atelectasis.

Aortic Atherosclerosis ([id number]).

## 2022-05-02 ENCOUNTER — Encounter: Payer: 59 | Admitting: Physical Medicine and Rehabilitation

## 2022-05-04 NOTE — Telephone Encounter (Signed)
Would you like her to take 1/2 tablets of 10 mg?

## 2022-05-04 NOTE — Telephone Encounter (Signed)
Insurance will not cover Baclofen 5 mg but covers Baclofen 10 mg at no cost to patient. Do you want to change strength?

## 2022-05-05 NOTE — Telephone Encounter (Signed)
Called Baclofen 10 mg take 1/2 tablet in the am and 1 1/2 tablets in the pm #[address] [facility] [place].

## 2022-05-06 ENCOUNTER — Encounter: Payer: Self-pay | Admitting: Physical Medicine and Rehabilitation

## 2022-05-11 ENCOUNTER — Telehealth: Payer: Self-pay | Admitting: Internal Medicine

## 2022-05-11 DIAGNOSIS — R1310 Dysphagia, unspecified: Secondary | ICD-10-CM

## 2022-05-11 DIAGNOSIS — I10 Essential (primary) hypertension: Secondary | ICD-10-CM

## 2022-05-11 DIAGNOSIS — F32A Depression, unspecified: Secondary | ICD-10-CM

## 2022-05-11 DIAGNOSIS — R001 Bradycardia, unspecified: Secondary | ICD-10-CM

## 2022-05-11 DIAGNOSIS — K5900 Constipation, unspecified: Secondary | ICD-10-CM

## 2022-05-11 DIAGNOSIS — I2699 Other pulmonary embolism without acute cor pulmonale: Secondary | ICD-10-CM

## 2022-05-11 DIAGNOSIS — E876 Hypokalemia: Secondary | ICD-10-CM

## 2022-05-11 DIAGNOSIS — J9621 Acute and chronic respiratory failure with hypoxia: Secondary | ICD-10-CM

## 2022-05-11 DIAGNOSIS — Z7901 Long term (current) use of anticoagulants: Secondary | ICD-10-CM

## 2022-05-11 DIAGNOSIS — I69152 Hemiplegia and hemiparesis following nontraumatic intracerebral hemorrhage affecting left dominant side: Secondary | ICD-10-CM

## 2022-05-11 NOTE — Telephone Encounter (Signed)
Called pt to inform 1) appt scheduled with Provider NP Amy Zonia Kief needed to be rescheduled with Dr. [name].   2) Per pt's request to have appt "pushed out" this was moved until after pt's other appts scheduled w/ specialists.   Pt did not answer and I could not leave a voice message as Voicemail was full.   Please advise and thank you.

## 2022-05-14 IMAGING — DX DG CHEST 1V PORT
1 series · 1 of 1 positions shown · non-contrast
Comparison: 02/25/2019 [DATE]D

CLINICAL DATA: Shortness of breath.

EXAM:
PORTABLE CHEST 1 VIEW

[chest]
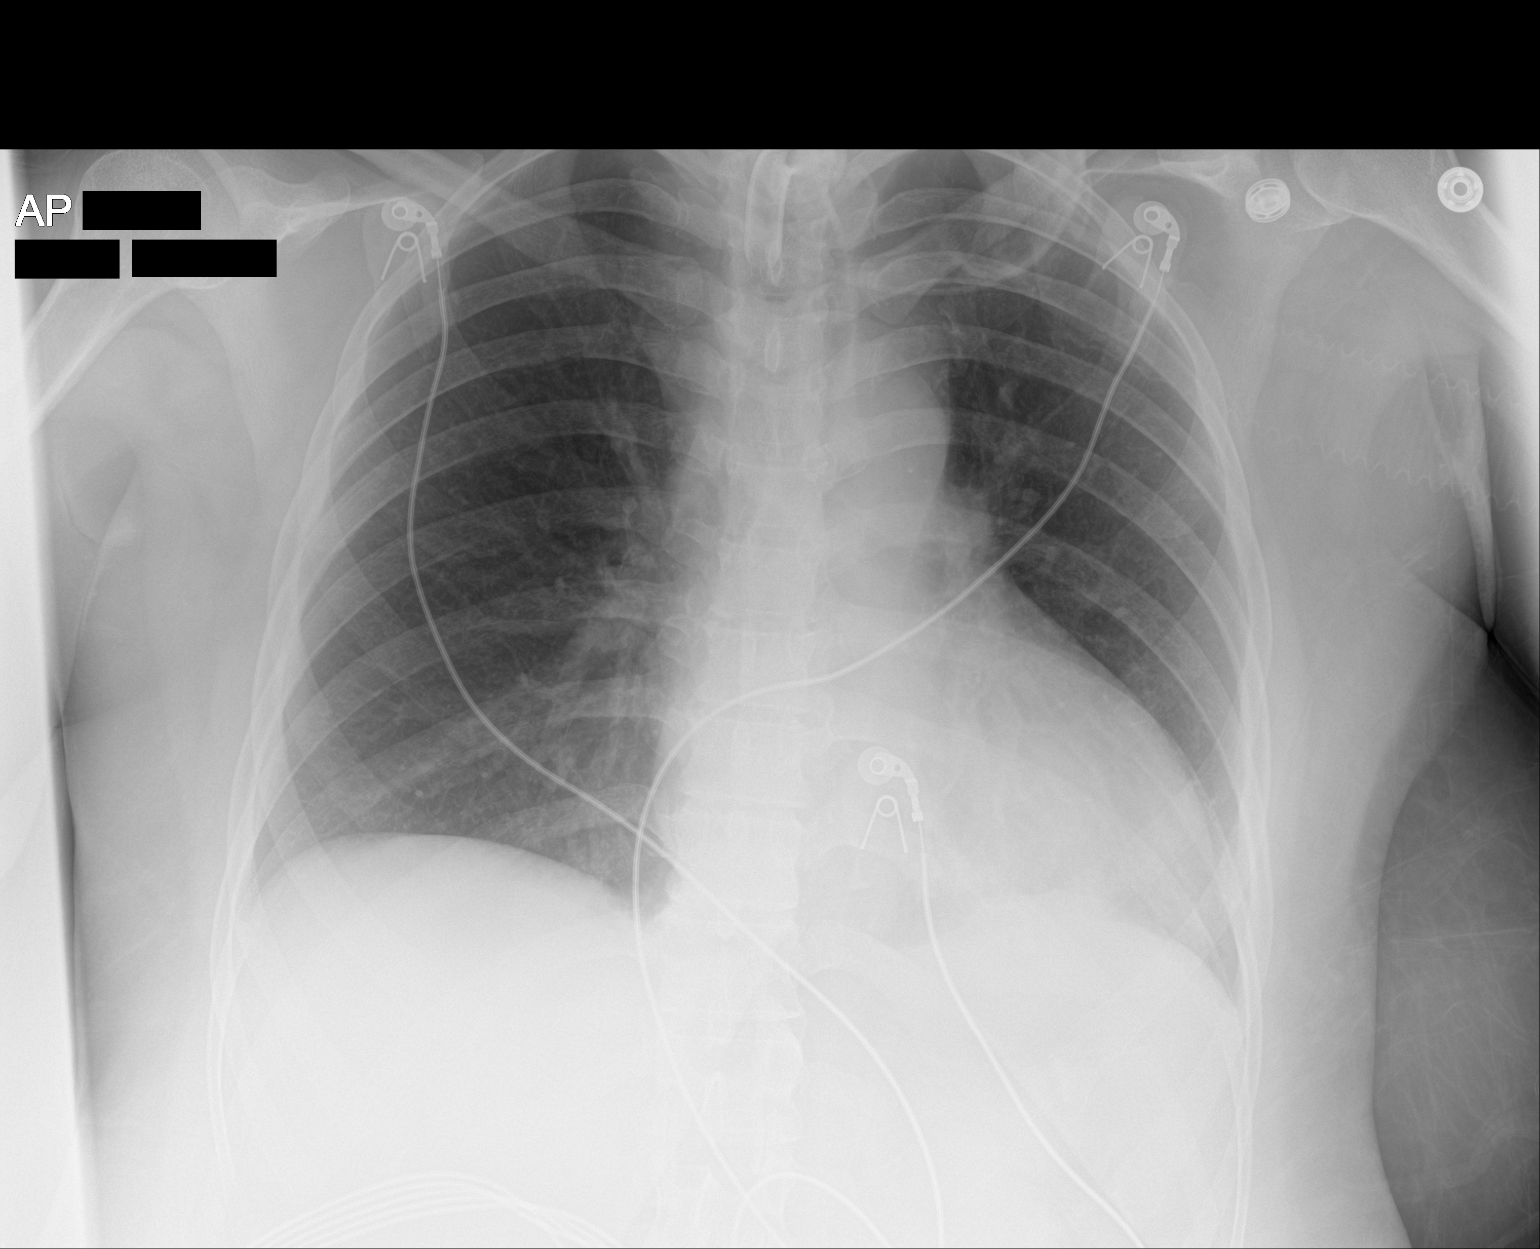

[1 of 1 positions shown; findings below may reference images not displayed]

FINDINGS: The tracheostomy tube is stable.

Stable borderline cardiac enlargement. The mediastinal and hilar
contours are within normal limits. The lungs are clear of an acute
process. No infiltrates or effusions. The bony thorax is intact.
IMPRESSION: No acute cardiopulmonary findings.

## 2022-05-14 IMAGING — DX DG ABDOMEN 1V
1 series · 1 of 1 positions shown · non-contrast
Comparison: Abdominal CT 03/09/2022

CLINICAL DATA: Peg placement

EXAM:
ABDOMEN - 1 VIEW

[abdomen kub]
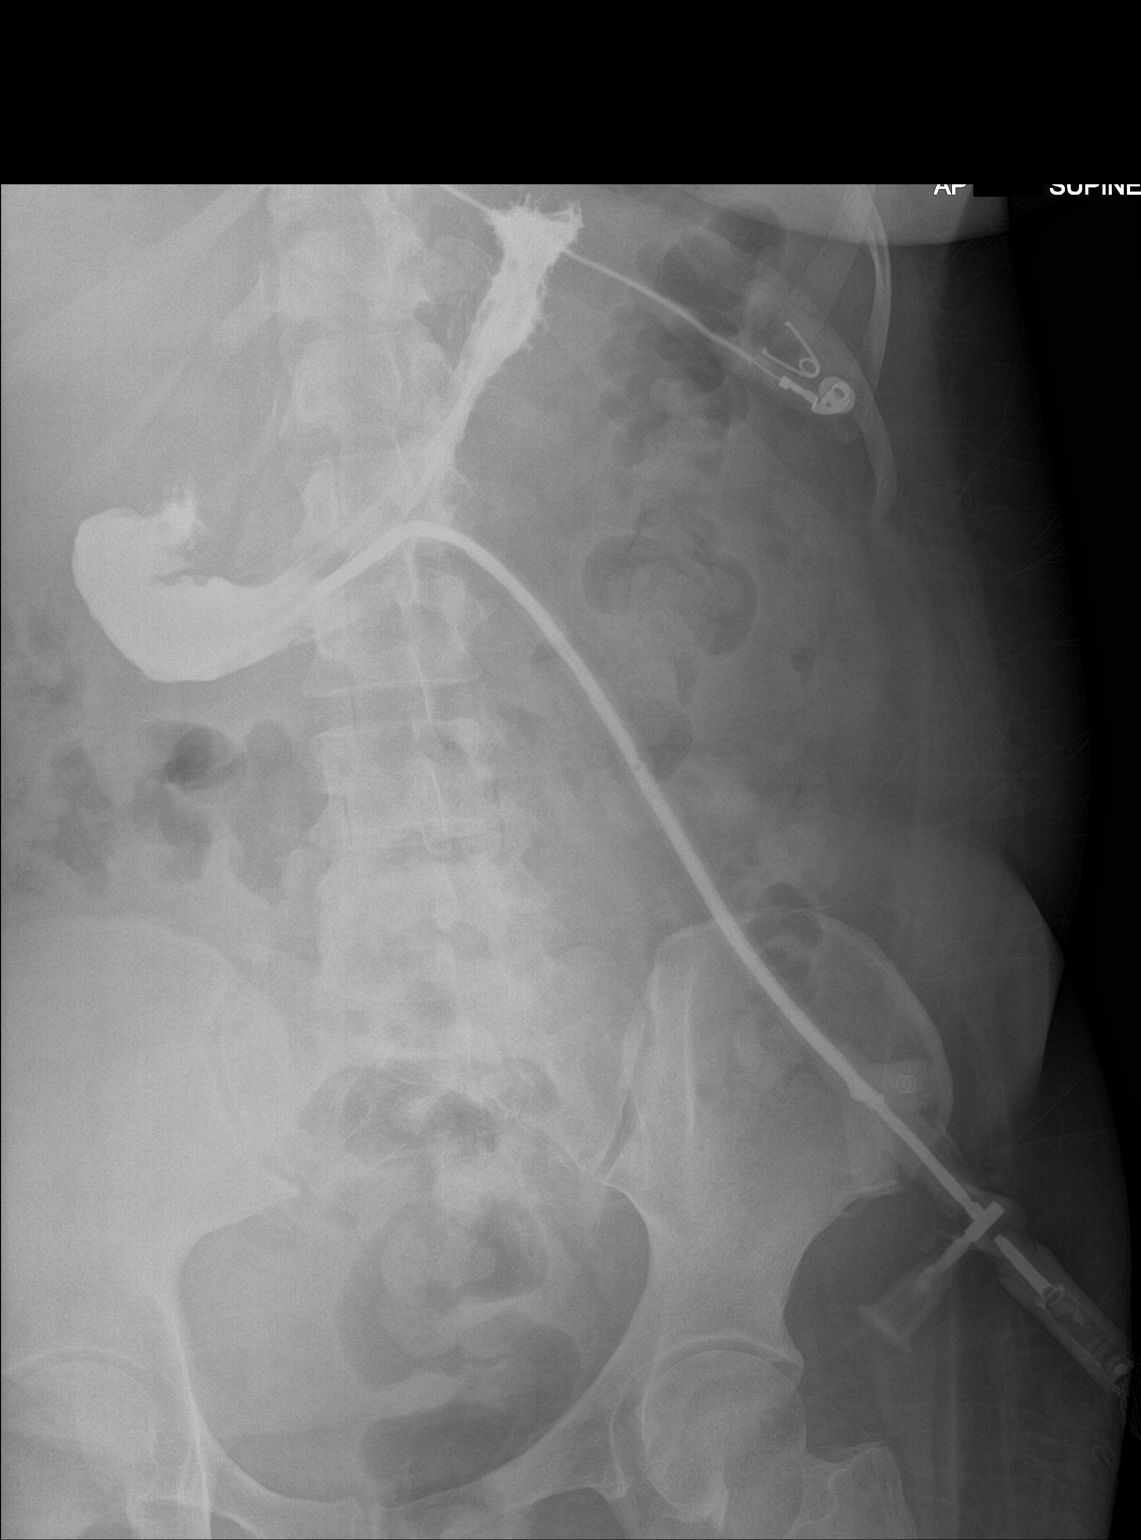

[1 of 1 positions shown; findings below may reference images not displayed]

FINDINGS: Contrast injection into a PEG tube opacifies the stomach without
visible extravasation. Visualized bowel gas pattern is normal.
IMPRESSION: Located percutaneous gastrostomy tube.

## 2022-05-16 ENCOUNTER — Ambulatory Visit: Payer: 59 | Admitting: Cardiology

## 2022-05-20 ENCOUNTER — Encounter: Payer: Self-pay | Admitting: Physical Medicine and Rehabilitation

## 2022-05-20 ENCOUNTER — Other Ambulatory Visit: Payer: Self-pay | Admitting: Physician Assistant

## 2022-05-22 MED ORDER — FAMOTIDINE 20 MG PO TABS: 20.0000 mg | ORAL_TABLET | Freq: Two times a day (BID) | ORAL | 0 refills | Status: AC

## 2022-05-22 MED ORDER — AMANTADINE HCL 100 MG PO CAPS: 100.0000 mg | ORAL_CAPSULE | Freq: Two times a day (BID) | ORAL | 0 refills | Status: DC

## 2022-05-22 MED ORDER — HYDRALAZINE HCL 50 MG PO TABS: 50.0000 mg | ORAL_TABLET | Freq: Four times a day (QID) | ORAL | 0 refills | Status: DC

## 2022-05-22 MED ORDER — LOSARTAN POTASSIUM 25 MG PO TABS: 25.0000 mg | ORAL_TABLET | Freq: Every day | ORAL | 0 refills | Status: DC

## 2022-05-22 MED ORDER — ADULT MULTIVITAMIN W/MINERALS CH: 1.0000 | ORAL_TABLET | Freq: Every day | ORAL | 0 refills | Status: AC

## 2022-05-22 MED ORDER — CARVEDILOL 6.25 MG PO TABS: 6.2500 mg | ORAL_TABLET | Freq: Two times a day (BID) | ORAL | 0 refills | Status: DC

## 2022-05-22 MED ORDER — APIXABAN 5 MG PO TABS: 5.0000 mg | ORAL_TABLET | Freq: Two times a day (BID) | ORAL | 0 refills | Status: DC

## 2022-05-22 MED ORDER — GABAPENTIN 100 MG PO CAPS: 100.0000 mg | ORAL_CAPSULE | Freq: Two times a day (BID) | ORAL | 0 refills | Status: DC

## 2022-05-22 MED ORDER — POTASSIUM CHLORIDE 20 MEQ PO PACK
20.0000 meq | PACK | Freq: Every day | ORAL | 0 refills | Status: DC
Start: 2022-05-22 — End: 2022-07-05

## 2022-05-22 MED ORDER — AMLODIPINE BESYLATE 10 MG PO TABS: 10.0000 mg | ORAL_TABLET | Freq: Every day | ORAL | 0 refills | Status: DC

## 2022-05-22 MED ORDER — BACLOFEN 10 MG PO TABS: ORAL_TABLET | ORAL | 0 refills | Status: DC

## 2022-05-22 MED ORDER — MAGNESIUM GLUCONATE 500 MG PO TABS: 500.0000 mg | ORAL_TABLET | Freq: Every day | ORAL | 0 refills | Status: AC

## 2022-05-23 ENCOUNTER — Telehealth: Payer: Self-pay

## 2022-05-23 NOTE — Telephone Encounter (Signed)
Geri RN with [facility] Called:   Donis Kotowski has left foot edema. Will you send in an order for a Compression Hose?  Per Daughter she is having a problem resting at night. Geri RN thinks it maybe an Anxiety issue.  Please advise.  Per Bo Merino RN patient has not been out of home much and doubt if she has seen a PCP.   Call back phone [phone]. (Caller is aware Dr. Carlis Abbott is out the office at this time. However a message will be sent).

## 2022-05-24 ENCOUNTER — Inpatient Hospital Stay: Payer: 59 | Admitting: Family

## 2022-05-24 ENCOUNTER — Encounter: Payer: 59 | Attending: Physical Medicine and Rehabilitation | Admitting: Physical Medicine and Rehabilitation

## 2022-05-24 DIAGNOSIS — G4700 Insomnia, unspecified: Secondary | ICD-10-CM | POA: Diagnosis not present

## 2022-05-24 DIAGNOSIS — M7989 Other specified soft tissue disorders: Secondary | ICD-10-CM

## 2022-05-24 DIAGNOSIS — R252 Cramp and spasm: Secondary | ICD-10-CM

## 2022-05-24 MED ORDER — TIZANIDINE HCL 2 MG PO CAPS: 2.0000 mg | ORAL_CAPSULE | Freq: Every day | ORAL | 3 refills | Status: DC

## 2022-05-24 MED ORDER — GABAPENTIN 100 MG PO CAPS: 100.0000 mg | ORAL_CAPSULE | Freq: Two times a day (BID) | ORAL | 0 refills | Status: DC

## 2022-05-25 NOTE — Progress Notes (Signed)
   Subjective:    Patient ID: Sarah Myers, female    DOB: August 16, 1971, 51 y.o.   MRN: [id number]  HPI An audio/video tele-health visit is felt to be the most appropriate encounter for this patient at this time. This is a follow up tele-visit via phone. The patient is at home. MD is at office. Prior to scheduling this appointment, our staff discussed the limitations of evaluation and management by telemedicine and the availability of in-person appointments. The patient expressed understanding and agreed to proceed.   Sarah Myers presents for follow-up of insomnia.  1) Insomnia -she has been taking Gabapentin and Baclofen but still wakes during the night with restlessness -daughter asks for additional medication she can take to help her sleep  2) Spasticity -still tight in upper and lower extremities at times  3) Swelling of all limbs    Review of Systems +swelling of all limbs, +spasticity, +insomnie    Objective:   Physical Exam Not performed       Assessment & Plan:   1) Insomnia -prescribed tizanidine 2mg  HS  2) Spasticity -prescribed tizanidine 2mg  HS  3) Swelling of limbs: -prescribed extra large compression garments for bilateral lower extremities, small for bilateral upper extremities  12 minutes spent in discussion of her insomnia, what she is currently taking, her current level of spasticity, discussing the risks and benefits of tizanidine, prescribing 2mg  at night, advising to follow-up with me regarding her response, discussion of swelling in her lower and upper extremities/worse on her affected side, prescription of compression garments to reduce her swelling

## 2022-05-26 ENCOUNTER — Encounter: Payer: Self-pay | Admitting: Physical Medicine and Rehabilitation

## 2022-05-29 ENCOUNTER — Encounter: Payer: 59 | Admitting: Physical Medicine and Rehabilitation

## 2022-05-31 ENCOUNTER — Encounter (HOSPITAL_BASED_OUTPATIENT_CLINIC_OR_DEPARTMENT_OTHER): Payer: 59 | Admitting: Physical Medicine and Rehabilitation

## 2022-05-31 ENCOUNTER — Ambulatory Visit: Payer: 59 | Admitting: Cardiology

## 2022-05-31 DIAGNOSIS — G4700 Insomnia, unspecified: Secondary | ICD-10-CM | POA: Diagnosis not present

## 2022-05-31 DIAGNOSIS — M7989 Other specified soft tissue disorders: Secondary | ICD-10-CM | POA: Diagnosis not present

## 2022-05-31 DIAGNOSIS — R252 Cramp and spasm: Secondary | ICD-10-CM | POA: Diagnosis not present

## 2022-05-31 DIAGNOSIS — R350 Frequency of micturition: Secondary | ICD-10-CM | POA: Diagnosis not present

## 2022-05-31 MED ORDER — TIZANIDINE HCL 4 MG PO TABS: 4.0000 mg | ORAL_TABLET | Freq: Every day | ORAL | 11 refills | Status: DC

## 2022-05-31 NOTE — Progress Notes (Signed)
   Subjective:    Patient ID: Sarah Myers, female    DOB: 1971/05/22, 51 y.o.   MRN: [id number]  HPI An audio/video tele-health visit is felt to be the most appropriate encounter for this patient at this time. This is a follow up tele-visit via phone. The patient is at home. MD is at office. Prior to scheduling this appointment, our staff discussed the limitations of evaluation and management by telemedicine and the availability of in-person appointments. The patient expressed understanding and agreed to proceed.   Mrs. Zeiders presents for f/u of insomnia and spasticity  1) Insomnia -she has been taking Gabapentin and Baclofen but still wakes during the night with restlessness -daughter asks for additional medication she can take to help her sleep -the tizanidine was not covered for her since she is already taking baclofen- her insurance told her only one type of muscle relaxer would be covered  2) Spasticity -still tight in upper and lower extremities at times -daughter is wary of stopping baclofen as she does not want her spasticity to worsen  3) Swelling of all limbs -the DME company no longer provides compression garments  4) Frequent urination -she has been needing to urinate every hour.  -no dysuria  5) Impaired mobility -her daughter would like to be able to bring her to her doctor's appointments but is unable to due to lack of transportation.    Review of Systems +swelling of all limbs, +spasticity, +insomnie    Objective:   Physical Exam Not performed       Assessment & Plan:   1) Insomnia -discussed replacing baclofen with tizanidine 4mg , sent script  2) Spasticity -discussed replacing baclofen with tizanidine 4mg , sent script, discussed that these would both be effective for spasticity  3) Swelling of limbs: -prescribed extra large compression garments for bilateral lower extremities, small for bilateral upper extremities, discussed that she could  purchase these online and send receipt to insurance for reimbursement  4) Impaired mobility and ADLs -reached out to SW and emailed patient [employer] [place] forms to complete for transportation  22 minutes spent in discussion of patient's insomnia, spasticity, urinary retention, swelling of limbs, and impaired mobility. Additional time spent reaching out to SW to learn about [employer], emailing these forms to daughter. Recommended to daughter trial of tizanidine for insomnia as well as spasticity, discontinuing baclofen in case this is contributing to urinary retention

## 2022-06-02 ENCOUNTER — Encounter (HOSPITAL_BASED_OUTPATIENT_CLINIC_OR_DEPARTMENT_OTHER): Payer: 59 | Admitting: Physical Medicine and Rehabilitation

## 2022-06-02 DIAGNOSIS — Z789 Other specified health status: Secondary | ICD-10-CM | POA: Diagnosis not present

## 2022-06-02 DIAGNOSIS — Z7409 Other reduced mobility: Secondary | ICD-10-CM

## 2022-06-02 NOTE — Progress Notes (Signed)
Subjective:    Patient ID: Sarah Myers, female    DOB: 1971-05-17, 51 y.o.   MRN: [id number]  HPI An audio/video tele-health visit is felt to be the most appropriate encounter for this patient at this time. This is a follow up tele-visit via phone. The patient is at home. MD is at office. Prior to scheduling this appointment, our staff discussed the limitations of evaluation and management by telemedicine and the availability of in-person appointments. The patient expressed understanding and agreed to proceed.   Sarah Myers presents for f/u of insomnia, impaired mobility and ADLs due to stroke, and spasticity  1) Insomnia -she has been taking Gabapentin and Baclofen but still wakes during the night with restlessness -daughter asks for additional medication she can take to help her sleep -the tizanidine was not covered for her since she is already taking baclofen- her insurance told her only one type of muscle relaxer would be covered  2) Spasticity -still tight in upper and lower extremities at times -daughter is wary of stopping baclofen as she does not want her spasticity to worsen  3) Swelling of all limbs -the DME company no longer provides compression garments  4) Frequent urination -she has been needing to urinate every hour.  -no dysuria  5) Impaired mobility -her daughter would like to be able to bring her to her doctor's appointments but is unable to due to lack of transportation. I emailed her there forms for [employer] and she needs assistance to fill these out today   Review of Systems +swelling of all limbs, +spasticity, +insomnie    Objective:   Physical Exam Not performed       Assessment & Plan:   1) Insomnia -discussed replacing baclofen with tizanidine 4mg , sent script  2) Spasticity -discussed replacing baclofen with tizanidine 4mg , sent script, discussed that these would both be effective for spasticity  3) Swelling of  limbs: -prescribed extra large compression garments for bilateral lower extremities, small for bilateral upper extremities, discussed that she could purchase these online and send receipt to insurance for reimbursement  4) Impaired mobility and ADLs -reached out to SW and emailed patient [employer] forms to complete for transportation -helped daughter to fill out each item on the [employer] forms and asked her to mail forms to our office for to send on her behalf since she does not have a fax machine -discussed every question on [employer] form and guiding daughter on how to answer questions accurately based on her mother's condition. There is a Part B on the form that needs to be filled by , discussed with daughter that she can mail this to our office and I can completed this portion before we fax the form for her, discussed with patient and daughter her major limitations of left sided weakness and how this inhibits her from walking, her inability to navigate the stairs in her apartment which has thus far prevented her from leaving her apartment, the fact that she does not have a ramp, her difficulty in getting to and from doctor's appointments due to this impaired mobility, the fact that she does not have a wheelchair ramp at home  41 minutes spent on phone with daughter and patient discussing every question on [employer] form and guiding daughter on how to answer questions accurately based on her mother's condition. There is a Part B on the form that needs to be filled by [place], discussed with daughter that she can  mail this to our office and I can completed this portion before we fax the form for her, discussed with patient and daughter her major limitations of left sided weakness and how this inhibits her from walking, her inability to navigate the stairs in her apartment which has thus far prevented her from leaving her apartment, the fact that she does not have a ramp, her difficulty in getting to  and from doctor's appointments due to this impaired mobility, the fact that she does not have a wheelchair ramp at home

## 2022-06-05 ENCOUNTER — Telehealth: Payer: Self-pay

## 2022-06-05 NOTE — Telephone Encounter (Signed)
Sarah Myers from home health called and stated there is a contraindication between Tizanidine and famotidine. She wants to know if the Tizanidine okay to take since it was recently added. Please advise

## 2022-06-07 ENCOUNTER — Inpatient Hospital Stay: Payer: Self-pay | Admitting: Adult Health

## 2022-06-07 NOTE — Telephone Encounter (Signed)
Spoke with Theora Gianotti and informed her that if the patient is no longer having reflux, she can stop famotidine

## 2022-06-08 ENCOUNTER — Telehealth: Payer: Self-pay

## 2022-06-08 NOTE — Telephone Encounter (Signed)
Okay given for in-home Speech Therapy re-cert. Hospital discharge note reviewed. Theora Gianotti Speech Therapist with [facility]).

## 2022-06-09 ENCOUNTER — Encounter (HOSPITAL_BASED_OUTPATIENT_CLINIC_OR_DEPARTMENT_OTHER): Payer: 59 | Admitting: Physical Medicine and Rehabilitation

## 2022-06-09 DIAGNOSIS — R252 Cramp and spasm: Secondary | ICD-10-CM | POA: Diagnosis not present

## 2022-06-09 DIAGNOSIS — R443 Hallucinations, unspecified: Secondary | ICD-10-CM

## 2022-06-09 DIAGNOSIS — F411 Generalized anxiety disorder: Secondary | ICD-10-CM

## 2022-06-09 MED ORDER — HYDROXYZINE HCL 10 MG PO TABS: 10.0000 mg | ORAL_TABLET | Freq: Three times a day (TID) | ORAL | 0 refills | Status: DC | PRN

## 2022-06-09 MED ORDER — LOSARTAN POTASSIUM 25 MG PO TABS: 25.0000 mg | ORAL_TABLET | Freq: Every day | ORAL | 3 refills | Status: AC

## 2022-06-12 ENCOUNTER — Other Ambulatory Visit: Payer: Self-pay | Admitting: Physical Medicine and Rehabilitation

## 2022-06-13 ENCOUNTER — Encounter (HOSPITAL_BASED_OUTPATIENT_CLINIC_OR_DEPARTMENT_OTHER): Payer: 59 | Admitting: Physical Medicine and Rehabilitation

## 2022-06-13 ENCOUNTER — Inpatient Hospital Stay: Payer: 59 | Admitting: Physical Medicine and Rehabilitation

## 2022-06-13 DIAGNOSIS — F411 Generalized anxiety disorder: Secondary | ICD-10-CM

## 2022-06-13 DIAGNOSIS — G4700 Insomnia, unspecified: Secondary | ICD-10-CM

## 2022-06-13 DIAGNOSIS — R443 Hallucinations, unspecified: Secondary | ICD-10-CM | POA: Diagnosis not present

## 2022-06-13 DIAGNOSIS — R350 Frequency of micturition: Secondary | ICD-10-CM

## 2022-06-13 DIAGNOSIS — R252 Cramp and spasm: Secondary | ICD-10-CM | POA: Diagnosis not present

## 2022-06-13 DIAGNOSIS — Z789 Other specified health status: Secondary | ICD-10-CM

## 2022-06-13 DIAGNOSIS — M7989 Other specified soft tissue disorders: Secondary | ICD-10-CM

## 2022-06-13 DIAGNOSIS — Z7409 Other reduced mobility: Secondary | ICD-10-CM | POA: Diagnosis not present

## 2022-06-13 DIAGNOSIS — K219 Gastro-esophageal reflux disease without esophagitis: Secondary | ICD-10-CM

## 2022-06-13 DIAGNOSIS — Z79899 Other long term (current) drug therapy: Secondary | ICD-10-CM

## 2022-06-14 NOTE — Progress Notes (Signed)
Subjective:    Patient ID: Sarah Myers, female    DOB: 1971-12-07, 51 y.o.   MRN: [id number]  HPI An audio/video tele-health visit is felt to be the most appropriate encounter for this patient at this time. This is a follow up tele-visit via phone. The patient is at home. MD is at office. Prior to scheduling this appointment, our staff discussed the limitations of evaluation and management by telemedicine and the availability of in-person appointments. The patient expressed understanding and agreed to proceed.   Sarah Myers presents for f/u of insomnia, impaired mobility and ADLs due to stroke, and spasticity  1) Insomnia -sleeping better now  2) Spasticity -still tight in upper and lower extremities at times -daughter thinks patient developed hallucinations in response to tizanidine and these have resolved since she has stopped tizanidine -she has restarted Baclofen  3) Swelling of all limbs -improved -the DME company no longer provides compression garments  4) Frequent urination -improved -no dysuria  5) Impaired mobility -her daughter would like to be able to bring her to her doctor's appointments but is unable to due to lack of transportation. I emailed her there forms for [employer] -she is able to walk with assistance inside apartment  6) Hallucinations: -daughter notes these have started after she started taking Tizanidine -has resolved since tizanidine has stopped  7) Anxiety -daughter notes it has been severe -she has picked up hydroxyzine and will try this   Review of Systems +swelling of all limbs, +spasticity, +insomnia, +hallucinations    Objective:   Physical Exam Not performed       Assessment & Plan:   1) Insomnia -improved -d/c tizanidine and restart baclofen  2) Spasticity -d/c tizanidine and restart baclofen, check CMP next office visit to assess LFTs while on Tizanidine -discussed Botox and daughter will consider  3) Swelling  of limbs: -improved -prescribed extra large compression garments for bilateral lower extremities, small for bilateral upper extremities, discussed that she could purchase these online and send receipt to insurance for reimbursement  4) Impaired mobility and ADLs -reached out to SW and emailed patient [employer] forms to complete for transportation -helped daughter to fill out each item on the [employer] forms and asked her to mail forms to our office for [place] to send on her behalf since she does not have a fax machine -discussed every question on [employer] form and guiding daughter on how to answer questions accurately based on her mother's condition. There is a Part B on the form that needs to be filled by [place], discussed with daughter that she can mail this to our office and I can completed this portion before we fax the form for her, discussed with patient and daughter her major limitations of left sided weakness and how this inhibits her from walking, her inability to navigate the stairs in her apartment which has thus far prevented her from leaving her apartment, the fact that she does not have a ramp, her difficulty in getting to and from doctor's appointments due to this impaired mobility, the fact that she does not have a wheelchair ramp at home -[employer] [place] to call patient to answer some of her questions  4) Anxiety -prescribed hydroxyzine prn -discussed decreasing Zoloft to 25mg  since daughter would prefer to use PRN medication for her -discussed benefits of meditation and exercise  5) Polypharmacy -reviewed all medications, their indications, and potential side effects with daughter.   6) GERD -recommended using pepcid  PRN  7) HTN -continue Cozaar -drink 6-8 glasses of water per day to protect kidneys while using Cozaar -continue magnesium supplement  8) History of CVA -continue multivitamin  40 minutes spent in reviewing all medications, their indications and side  effects with daughter, discussed Botox for spasticity and daughter and patient will consider, advised them to let me know at least 5 days prior to our in-person appointment so our staff can obtain insurance authorization prior if she would like this, discussed swelling of her lower limbs has improved, discussed decreasing Zoloft to prn since daughter would prefer to use PRN medication for her, discussed we can wean to off in 2 weeks if anxiety and depression are stable, messaged inpatient SW Kriste Basque Dupree to request she call patient to answer some of her questions, recommended using Pepcid prn for GERD, recommended continuing Cozaar for HTN, recommended she drink 6-8 glasses of water per day to protect kidneys while using Cozaar, recommended continuing multivitamin, discussed that magnesium is optional but can help lower blood pressure and relax patient

## 2022-06-16 ENCOUNTER — Inpatient Hospital Stay: Payer: 59 | Admitting: Family Medicine

## 2022-06-21 ENCOUNTER — Encounter
Payer: Commercial Managed Care - HMO | Attending: Physical Medicine and Rehabilitation | Admitting: Physical Medicine and Rehabilitation

## 2022-06-21 DIAGNOSIS — Z789 Other specified health status: Secondary | ICD-10-CM | POA: Diagnosis not present

## 2022-06-21 DIAGNOSIS — F411 Generalized anxiety disorder: Secondary | ICD-10-CM | POA: Insufficient documentation

## 2022-06-21 DIAGNOSIS — R4189 Other symptoms and signs involving cognitive functions and awareness: Secondary | ICD-10-CM | POA: Diagnosis not present

## 2022-06-21 DIAGNOSIS — Z7409 Other reduced mobility: Secondary | ICD-10-CM | POA: Insufficient documentation

## 2022-06-21 DIAGNOSIS — R413 Other amnesia: Secondary | ICD-10-CM | POA: Diagnosis not present

## 2022-06-22 ENCOUNTER — Other Ambulatory Visit: Payer: Self-pay | Admitting: Physical Medicine and Rehabilitation

## 2022-06-22 ENCOUNTER — Telehealth: Payer: Self-pay

## 2022-06-22 ENCOUNTER — Telehealth: Payer: Self-pay | Admitting: *Deleted

## 2022-06-22 MED ORDER — APIXABAN 5 MG PO TABS: 5.0000 mg | ORAL_TABLET | Freq: Two times a day (BID) | ORAL | 1 refills | Status: AC

## 2022-06-22 MED ORDER — SERTRALINE HCL 50 MG PO TABS
50.0000 mg | ORAL_TABLET | Freq: Every day | ORAL | 3 refills | Status: DC
Start: 2022-06-22 — End: 2023-10-13

## 2022-06-22 MED ORDER — AMLODIPINE BESYLATE 10 MG PO TABS: 10.0000 mg | ORAL_TABLET | Freq: Every day | ORAL | 3 refills | Status: AC

## 2022-06-22 MED ORDER — AMANTADINE HCL 100 MG PO CAPS: 100.0000 mg | ORAL_CAPSULE | Freq: Two times a day (BID) | ORAL | 3 refills | Status: DC

## 2022-06-22 MED ORDER — HYDRALAZINE HCL 50 MG PO TABS
50.0000 mg | ORAL_TABLET | Freq: Four times a day (QID) | ORAL | 3 refills | Status: DC
Start: 2022-06-22 — End: 2022-10-17

## 2022-06-22 MED ORDER — CARVEDILOL 6.25 MG PO TABS: 6.2500 mg | ORAL_TABLET | Freq: Two times a day (BID) | ORAL | 3 refills | Status: DC

## 2022-06-22 NOTE — Telephone Encounter (Signed)
Geralyn PT called for approval for POC for 1wk9.  She also reports that the patient is having visual and auditory hallucinations- very evident today but the patient is cognitively aware she is having them as well. I gave the ok for the POC.

## 2022-06-22 NOTE — Progress Notes (Addendum)
Subjective:    Patient ID: Sarah Myers, female    DOB: 10/20/1971, 51 y.o.   MRN: [id number]  HPI An audio/video tele-health visit is felt to be the most appropriate encounter for this patient at this time. This is a follow up tele-visit via phone. The patient is at home. MD is at office. Prior to scheduling this appointment, our staff discussed the limitations of evaluation and management by telemedicine and the availability of in-person appointments. The patient expressed understanding and agreed to proceed.   Sarah Myers presents for f/u of insomnia, impaired mobility and ADLs due to stroke, and spasticity  1) Insomnia -sleeping better now  2) Spasticity -still tight in upper and lower extremities at times -daughter thinks patient developed hallucinations in response to tizanidine and these have resolved since she has stopped tizanidine -she has restarted Baclofen  3) Swelling of all limbs -improved -the DME company no longer provides compression garments  4) Frequent urination -improved -no dysuria  5) Impaired mobility -her daughter would like to be able to bring her to her doctor's appointments but is unable to due to lack of transportation. 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   6) Hallucinations: -daughter notes these have started after she started taking Tizanidine -has resolved since tizanidine has stopped  7) Anxiety -daughter notes it has been severe -she has picked up hydroxyzine and will try this  8) Impaired memory and cognition -daughter notes impaired memory and cognition and asks if her mom could be getting early dementia, she asks what can be done for this, she asks if any testing is needed   Review of Systems +swelling of all limbs, +spasticity, +insomnia,  +hallucinations    Objective:   Physical Exam Not performed       Assessment & Plan:   1) Insomnia -improved -d/c tizanidine and restart baclofen  2) Spasticity -d/c tizanidine and restart baclofen, check CMP next office visit to assess LFTs while on Tizanidine -discussed Botox and daughter will consider  3) Swelling of limbs: -improved -prescribed extra large compression garments for bilateral lower extremities, small for bilateral upper extremities, discussed that she could purchase these online and send receipt to insurance for reimbursement  4) Impaired mobility and ADLs -reached out to SW and emailed patient [employer] forms to complete for transportation -helped daughter to fill out each item on the [employer] forms and asked her to mail forms to our office for [place] to send on her behalf since she does not have a fax machine -discussed every question on [employer] form and guiding daughter on how to answer questions accurately based on her mother's condition. There is a Part B on the form that needs to be filled by [place], discussed with daughter that she can mail this to our office and I can completed this portion before we fax the form for her, discussed with patient and daughter her major limitations of left sided weakness and how this inhibits her from walking, her inability to navigate the stairs in her apartment which has thus far prevented her from leaving her apartment, the fact that she does not have a ramp, her difficulty in getting to and from doctor's appointments due to this impaired mobility, the fact that she does not have a wheelchair ramp at  home -messaged Kriste Basque Dupree to call patient to answer some of her questions -provided note stating that daughter has been unable to work due to caring for her mother full time at home  4) Anxiety -prescribed hydroxyzine prn -discussed decreasing Zoloft to 25mg  since daughter would prefer to use PRN medication for her -discussed  benefits of meditation and exercise  5) Polypharmacy -reviewed all medications, their indications, and potential side effects with daughter.   6) GERD -recommended using pepcid PRN  7) HTN -continue Cozaar -drink 6-8 glasses of water per day to protect kidneys while using Cozaar -continue magnesium supplement  8) History of CVA -continue multivitamin  9) Cognitive impairment:  -discussed that cognitive impairment can be secondary to stroke, her baclofen medication -discussed that neurology can help with cognitive testing to assess for dementia -discussed patient's risk for vascular demential given stroke and cardiac history -recommended avoiding processed foods such as added sugar, bread/pasta/rice -discussed that insulin resistance can worsen cognitive impairment -recommended eating fruits, vegetables, lean meats, olive oil, nuts, and yogurt. Highlighted salmon, blueberries and walnuts as foods that enhance cognition  9 minutes spent in discussion of her cognitive impairment, how stroke can contribute, how baclofen can contribute, and providing dietary education

## 2022-06-22 NOTE — Telephone Encounter (Signed)
PA required for Eliquis. What is the diagnosis?

## 2022-06-23 ENCOUNTER — Telehealth: Payer: Self-pay

## 2022-06-23 ENCOUNTER — Emergency Department (HOSPITAL_COMMUNITY): Payer: Commercial Managed Care - HMO

## 2022-06-23 ENCOUNTER — Inpatient Hospital Stay (HOSPITAL_COMMUNITY)
Admission: EM | Admit: 2022-06-23 | Discharge: 2022-06-26 | DRG: 689 | Disposition: A | Discharge status: Home Health | Payer: Commercial Managed Care - HMO | Attending: Internal Medicine | Admitting: Internal Medicine

## 2022-06-23 ENCOUNTER — Encounter (HOSPITAL_COMMUNITY): Payer: Self-pay | Admitting: Emergency Medicine

## 2022-06-23 ENCOUNTER — Other Ambulatory Visit: Payer: Self-pay

## 2022-06-23 ENCOUNTER — Encounter (HOSPITAL_BASED_OUTPATIENT_CLINIC_OR_DEPARTMENT_OTHER): Payer: Commercial Managed Care - HMO | Admitting: Physical Medicine and Rehabilitation

## 2022-06-23 DIAGNOSIS — N390 Urinary tract infection, site not specified: Principal | ICD-10-CM | POA: Diagnosis present

## 2022-06-23 DIAGNOSIS — A630 Anogenital (venereal) warts: Secondary | ICD-10-CM | POA: Diagnosis present

## 2022-06-23 DIAGNOSIS — B9620 Unspecified Escherichia coli [E. coli] as the cause of diseases classified elsewhere: Secondary | ICD-10-CM | POA: Diagnosis present

## 2022-06-23 DIAGNOSIS — Z8249 Family history of ischemic heart disease and other diseases of the circulatory system: Secondary | ICD-10-CM

## 2022-06-23 DIAGNOSIS — I69322 Dysarthria following cerebral infarction: Secondary | ICD-10-CM

## 2022-06-23 DIAGNOSIS — E870 Hyperosmolality and hypernatremia: Secondary | ICD-10-CM | POA: Diagnosis present

## 2022-06-23 DIAGNOSIS — Z7901 Long term (current) use of anticoagulants: Secondary | ICD-10-CM

## 2022-06-23 DIAGNOSIS — R509 Fever, unspecified: Principal | ICD-10-CM

## 2022-06-23 DIAGNOSIS — R4182 Altered mental status, unspecified: Secondary | ICD-10-CM

## 2022-06-23 DIAGNOSIS — G629 Polyneuropathy, unspecified: Secondary | ICD-10-CM | POA: Diagnosis present

## 2022-06-23 DIAGNOSIS — Z79899 Other long term (current) drug therapy: Secondary | ICD-10-CM

## 2022-06-23 DIAGNOSIS — G9341 Metabolic encephalopathy: Secondary | ICD-10-CM | POA: Diagnosis present

## 2022-06-23 DIAGNOSIS — Z8673 Personal history of transient ischemic attack (TIA), and cerebral infarction without residual deficits: Secondary | ICD-10-CM

## 2022-06-23 DIAGNOSIS — N9489 Other specified conditions associated with female genital organs and menstrual cycle: Secondary | ICD-10-CM | POA: Diagnosis present

## 2022-06-23 DIAGNOSIS — Z823 Family history of stroke: Secondary | ICD-10-CM

## 2022-06-23 DIAGNOSIS — F419 Anxiety disorder, unspecified: Secondary | ICD-10-CM

## 2022-06-23 DIAGNOSIS — F411 Generalized anxiety disorder: Secondary | ICD-10-CM | POA: Diagnosis not present

## 2022-06-23 DIAGNOSIS — Z86711 Personal history of pulmonary embolism: Secondary | ICD-10-CM

## 2022-06-23 DIAGNOSIS — Z20822 Contact with and (suspected) exposure to covid-19: Secondary | ICD-10-CM | POA: Diagnosis present

## 2022-06-23 DIAGNOSIS — R410 Disorientation, unspecified: Secondary | ICD-10-CM | POA: Diagnosis not present

## 2022-06-23 DIAGNOSIS — A4150 Gram-negative sepsis, unspecified: Secondary | ICD-10-CM

## 2022-06-23 DIAGNOSIS — I10 Essential (primary) hypertension: Secondary | ICD-10-CM | POA: Diagnosis present

## 2022-06-23 DIAGNOSIS — Z888 Allergy status to other drugs, medicaments and biological substances status: Secondary | ICD-10-CM

## 2022-06-23 DIAGNOSIS — I69354 Hemiplegia and hemiparesis following cerebral infarction affecting left non-dominant side: Secondary | ICD-10-CM

## 2022-06-23 DIAGNOSIS — E876 Hypokalemia: Secondary | ICD-10-CM | POA: Diagnosis not present

## 2022-06-23 DIAGNOSIS — E878 Other disorders of electrolyte and fluid balance, not elsewhere classified: Secondary | ICD-10-CM | POA: Diagnosis present

## 2022-06-23 DIAGNOSIS — R404 Transient alteration of awareness: Secondary | ICD-10-CM | POA: Diagnosis not present

## 2022-06-23 DIAGNOSIS — F32A Depression, unspecified: Secondary | ICD-10-CM | POA: Diagnosis present

## 2022-06-23 LAB — URINALYSIS, ROUTINE W REFLEX MICROSCOPIC
Bilirubin Urine: NEGATIVE
Glucose, UA: NEGATIVE mg/dL
Hgb urine dipstick: NEGATIVE
Ketones, ur: 20 mg/dL — AB
Nitrite: NEGATIVE
Protein, ur: 30 mg/dL — AB
Specific Gravity, Urine: 1.017 (ref 1.005–1.030)
pH: 5 (ref 5.0–8.0)

## 2022-06-23 LAB — COMPREHENSIVE METABOLIC PANEL
ALT: 23 U/L (ref 0–44)
AST: 16 U/L (ref 15–41)
Albumin: 4.5 g/dL (ref 3.5–5.0)
Alkaline Phosphatase: 75 U/L (ref 38–126)
Anion gap: 12 (ref 5–15)
BUN: 20 mg/dL (ref 6–20)
CO2: 22 mmol/L (ref 22–32)
Calcium: 12.7 mg/dL — ABNORMAL HIGH (ref 8.9–10.3)
Chloride: 113 mmol/L — ABNORMAL HIGH (ref 98–111)
Creatinine, Ser: 1.09 mg/dL — ABNORMAL HIGH (ref 0.44–1.00)
GFR, Estimated: >60 mL/min (ref >60)
Glucose, Bld: 104 mg/dL — ABNORMAL HIGH (ref 70–99)
Potassium: 3.8 mmol/L (ref 3.5–5.1)
Sodium: 147 mmol/L — ABNORMAL HIGH (ref 135–145)
Total Bilirubin: 0.9 mg/dL (ref 0.3–1.2)
Total Protein: 8.3 g/dL — ABNORMAL HIGH (ref 6.5–8.1)

## 2022-06-23 LAB — CBC WITH DIFFERENTIAL/PLATELET
Abs Immature Granulocytes: 0 10*3/uL (ref 0.00–0.07)
Basophils Absolute: 0.1 10*3/uL (ref 0.0–0.1)
Basophils Relative: 1 %
Eosinophils Absolute: 0 10*3/uL (ref 0.0–0.5)
Eosinophils Relative: 1 %
HCT: 41.5 % (ref 36.0–46.0)
Hemoglobin: 13.7 g/dL (ref 12.0–15.0)
Immature Granulocytes: 0 %
Lymphocytes Relative: 30 %
Lymphs Abs: 1.8 10*3/uL (ref 0.7–4.0)
MCH: 28.5 pg (ref 26.0–34.0)
MCHC: 33 g/dL (ref 30.0–36.0)
MCV: 86.5 fL (ref 80.0–100.0)
Monocytes Absolute: 0.5 10*3/uL (ref 0.1–1.0)
Monocytes Relative: 8 %
Neutro Abs: 3.7 10*3/uL (ref 1.7–7.7)
Neutrophils Relative %: 60 %
Platelets: 193 10*3/uL (ref 150–400)
RBC: 4.8 MIL/uL (ref 3.87–5.11)
RDW: 12.5 % (ref 11.5–15.5)
WBC: 6 10*3/uL (ref 4.0–10.5)
nRBC: 0 % (ref 0.0–0.2)

## 2022-06-23 LAB — ETHANOL: Alcohol, Ethyl (B): <10 mg/dL (ref <10)

## 2022-06-23 LAB — LACTIC ACID, PLASMA
Lactic Acid, Venous: 0.7 mmol/L (ref 0.5–1.9)
Lactic Acid, Venous: 0.8 mmol/L (ref 0.5–1.9)
Lactic Acid, Venous: 0.8 mmol/L (ref 0.5–1.9)

## 2022-06-23 LAB — AMMONIA: Ammonia: 19 umol/L (ref 9–35)

## 2022-06-23 LAB — TROPONIN I (HIGH SENSITIVITY)
Troponin I (High Sensitivity): 14 ng/L (ref <18)
Troponin I (High Sensitivity): 14 ng/L (ref <18)

## 2022-06-23 LAB — SARS CORONAVIRUS 2 BY RT PCR: SARS Coronavirus 2 by RT PCR: NEGATIVE

## 2022-06-23 LAB — BRAIN NATRIURETIC PEPTIDE: B Natriuretic Peptide: 30.6 pg/mL (ref 0.0–100.0)

## 2022-06-23 MED ORDER — APIXABAN 5 MG PO TABS
5.0000 mg | ORAL_TABLET | Freq: Two times a day (BID) | ORAL | Status: DC
  Administered 2022-06-24 – 2022-06-26 (×6): 5 mg via ORAL
  Filled 2022-06-23 (×7): qty 1

## 2022-06-23 MED ORDER — MAGNESIUM HYDROXIDE 400 MG/5ML PO SUSP: 30.0000 mL | Freq: Every day | ORAL | Status: DC | PRN

## 2022-06-23 MED ORDER — SODIUM CHLORIDE 0.9 % IV SOLN: INTRAVENOUS | Status: DC

## 2022-06-23 MED ORDER — SERTRALINE HCL 25 MG PO TABS
25.0000 mg | ORAL_TABLET | Freq: Every day | ORAL | Status: DC
  Administered 2022-06-24 – 2022-06-25 (×3): 25 mg via ORAL
  Filled 2022-06-23 (×3): qty 1

## 2022-06-23 MED ORDER — GABAPENTIN 100 MG PO CAPS: 100.0000 mg | ORAL_CAPSULE | Freq: Two times a day (BID) | ORAL | Status: DC | PRN

## 2022-06-23 MED ORDER — CARVEDILOL 6.25 MG PO TABS
6.2500 mg | ORAL_TABLET | Freq: Two times a day (BID) | ORAL | Status: DC
  Administered 2022-06-24 – 2022-06-26 (×6): 6.25 mg via ORAL
  Filled 2022-06-23 (×2): qty 1
  Filled 2022-06-23: qty 2
  Filled 2022-06-23 (×2): qty 1
  Filled 2022-06-23: qty 2

## 2022-06-23 MED ORDER — SODIUM CHLORIDE 0.9 % IV BOLUS
500.0000 mL | Freq: Once | INTRAVENOUS | Status: AC
  Administered 2022-06-23: 500 mL via INTRAVENOUS

## 2022-06-23 MED ORDER — TRAZODONE HCL 50 MG PO TABS
25.0000 mg | ORAL_TABLET | Freq: Every evening | ORAL | Status: DC | PRN
  Administered 2022-06-24 – 2022-06-25 (×2): 25 mg via ORAL
  Filled 2022-06-23 (×3): qty 1

## 2022-06-23 MED ORDER — MAGNESIUM GLUCONATE 500 MG PO TABS
500.0000 mg | ORAL_TABLET | Freq: Every day | ORAL | Status: DC
  Administered 2022-06-24 – 2022-06-25 (×3): 500 mg via ORAL
  Filled 2022-06-23 (×3): qty 1

## 2022-06-23 MED ORDER — LOSARTAN POTASSIUM 50 MG PO TABS
25.0000 mg | ORAL_TABLET | Freq: Every day | ORAL | Status: DC
  Administered 2022-06-24 – 2022-06-26 (×3): 25 mg via ORAL
  Filled 2022-06-23 (×4): qty 1

## 2022-06-23 MED ORDER — AMANTADINE HCL 100 MG PO CAPS
100.0000 mg | ORAL_CAPSULE | Freq: Two times a day (BID) | ORAL | Status: DC
  Administered 2022-06-24 – 2022-06-26 (×6): 100 mg via ORAL
  Filled 2022-06-23 (×6): qty 1

## 2022-06-23 MED ORDER — BUSPIRONE HCL 5 MG PO TABS: 5.0000 mg | ORAL_TABLET | Freq: Three times a day (TID) | ORAL | 3 refills | Status: DC | PRN

## 2022-06-23 MED ORDER — BUSPIRONE HCL 5 MG PO TABS: 5.0000 mg | ORAL_TABLET | Freq: Three times a day (TID) | ORAL | Status: DC | PRN

## 2022-06-23 MED ORDER — AMLODIPINE BESYLATE 10 MG PO TABS
10.0000 mg | ORAL_TABLET | Freq: Every day | ORAL | Status: DC
  Administered 2022-06-24 – 2022-06-26 (×3): 10 mg via ORAL
  Filled 2022-06-23: qty 1
  Filled 2022-06-23 (×2): qty 2
  Filled 2022-06-23: qty 1

## 2022-06-23 MED ORDER — ACETAMINOPHEN 325 MG PO TABS: 650.0000 mg | ORAL_TABLET | Freq: Four times a day (QID) | ORAL | Status: DC | PRN

## 2022-06-23 MED ORDER — BACLOFEN 10 MG PO TABS
5.0000 mg | ORAL_TABLET | Freq: Two times a day (BID) | ORAL | Status: DC
  Administered 2022-06-24 – 2022-06-26 (×6): 10 mg via ORAL
  Filled 2022-06-23 (×7): qty 1

## 2022-06-23 MED ORDER — HYDROXYZINE HCL 10 MG PO TABS
10.0000 mg | ORAL_TABLET | Freq: Three times a day (TID) | ORAL | Status: DC | PRN
  Administered 2022-06-24: 10 mg via ORAL
  Filled 2022-06-23: qty 1

## 2022-06-23 MED ORDER — FAMOTIDINE 20 MG PO TABS
20.0000 mg | ORAL_TABLET | Freq: Two times a day (BID) | ORAL | Status: DC | PRN
Start: 2022-06-23 — End: 2022-06-26

## 2022-06-23 MED ORDER — FUROSEMIDE 10 MG/ML IJ SOLN
20.0000 mg | Freq: Once | INTRAMUSCULAR | Status: AC
  Administered 2022-06-24: 20 mg via INTRAVENOUS
  Filled 2022-06-23: qty 4

## 2022-06-23 MED ORDER — ONDANSETRON HCL 4 MG/2ML IJ SOLN: 4.0000 mg | Freq: Four times a day (QID) | INTRAMUSCULAR | Status: DC | PRN

## 2022-06-23 MED ORDER — SODIUM CHLORIDE 0.9 % IV BOLUS
1000.0000 mL | Freq: Once | INTRAVENOUS | Status: AC
Start: 2022-06-23 — End: 2022-06-23
  Administered 2022-06-23: 1000 mL via INTRAVENOUS

## 2022-06-23 MED ORDER — HYDRALAZINE HCL 50 MG PO TABS
50.0000 mg | ORAL_TABLET | Freq: Four times a day (QID) | ORAL | Status: DC
  Administered 2022-06-24 – 2022-06-26 (×9): 50 mg via ORAL
  Filled 2022-06-23 (×2): qty 1
  Filled 2022-06-23: qty 2
  Filled 2022-06-23 (×5): qty 1
  Filled 2022-06-23: qty 2
  Filled 2022-06-23: qty 1

## 2022-06-23 MED ORDER — POTASSIUM CHLORIDE 20 MEQ PO PACK: 20.0000 meq | PACK | Freq: Every day | ORAL | Status: DC | PRN

## 2022-06-23 MED ORDER — ONDANSETRON HCL 4 MG PO TABS: 4.0000 mg | ORAL_TABLET | Freq: Four times a day (QID) | ORAL | Status: DC | PRN

## 2022-06-23 MED ORDER — ACETAMINOPHEN 325 MG PO TABS
650.0000 mg | ORAL_TABLET | Freq: Once | ORAL | Status: AC
  Administered 2022-06-23: 650 mg via ORAL
  Filled 2022-06-23: qty 2

## 2022-06-23 MED ORDER — SODIUM CHLORIDE 0.9 % IV SOLN
1.0000 g | Freq: Once | INTRAVENOUS | Status: AC
Start: 2022-06-23 — End: 2022-06-24
  Administered 2022-06-23: 1 g via INTRAVENOUS
  Filled 2022-06-23: qty 10

## 2022-06-23 MED ORDER — ACETAMINOPHEN 650 MG RE SUPP: 650.0000 mg | Freq: Four times a day (QID) | RECTAL | Status: DC | PRN

## 2022-06-23 MED ORDER — ADULT MULTIVITAMIN W/MINERALS CH
1.0000 | ORAL_TABLET | Freq: Every day | ORAL | Status: DC
Start: 2022-06-24 — End: 2022-06-26
  Administered 2022-06-24 – 2022-06-26 (×3): 1 via ORAL
  Filled 2022-06-23 (×4): qty 1

## 2022-06-23 NOTE — ED Notes (Addendum)
Lab is adding on the urine culture  °

## 2022-06-23 NOTE — ED Triage Notes (Signed)
Patient BIBA from home d/t generalized weakness and possible altered mental status. Daughter is primary caregiver - reports patient has been acting different than normal and has not been able to ambulate as well as she normally does. Per EMS, patient is Aox2 at baseline. Hx CVA w/ deficits - no new deficits noted.  HR 100  BP 170/100 SpO2 98%  CBG 120

## 2022-06-23 NOTE — ED Provider Notes (Signed)
[facility]-EMERGENCY DEPT Provider Note   CSN: [id number] Arrival date & time: 06/23/22  1531     History {Add pertinent medical, surgical, social history, OB history to HPI:1} Chief Complaint  Patient presents with   Weakness   Altered Mental Status    Sarah Myers is a 51 y.o. female.  Level 5 caveat secondary ultimately status.  Patient brought in by ambulance from home due to general weakness and possible altered mental status.  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  She was ultimately transferred to rehab and now is at home although she does not know how long.  She is complaining of some difficulty with her breathing.  She denies any abdominal pain.  At baseline she has minimal use of her left arm and left leg.  Lives with her daughters.  They are not present currently.  The history is provided by the patient.  Weakness Severity:  Moderate Relieved by:  None tried Worsened by:  Nothing Ineffective treatments:  None tried Associated symptoms: shortness of breath   Associated symptoms: no abdominal pain, no chest pain, no cough, no fever, no nausea and no vomiting   Altered Mental Status Associated symptoms: weakness   Associated symptoms: no abdominal pain, no fever, no nausea and no vomiting        Home Medications Prior to Admission medications   Medication Sig Start Date End Date Taking? Authorizing Provider  acetaminophen (TYLENOL) 325 MG tablet Take 1-2 tablets (325-650 mg total) by mouth every 4 (four) hours as needed for mild pain. 04/26/22   Setzer, Lynnell Jude, PA-C  amantadine (SYMMETREL) 100 MG capsule Take 1 capsule (100 mg total) by mouth 2 (two) times daily. 06/22/22   Raulkar, Drema Pry, MD  amLODipine (NORVASC) 10 MG tablet Take 1 tablet (10 mg total) by mouth daily. 06/22/22   Raulkar, Drema Pry, MD  apixaban (ELIQUIS) 5 MG TABS tablet Take 1 tablet (5 mg total) by mouth 2 (two) times  daily. 06/22/22   Raulkar, Drema Pry, MD  busPIRone (BUSPAR) 5 MG tablet Take 1 tablet (5 mg total) by mouth 3 (three) times daily as needed. 06/23/22   Raulkar, Drema Pry, MD  carvedilol (COREG) 6.25 MG tablet Take 1 tablet (6.25 mg total) by mouth 2 (two) times daily with a meal. 06/22/22   Raulkar, Drema Pry, MD  famotidine (PEPCID) 20 MG tablet Take 1 tablet (20 mg total) by mouth 2 (two) times daily. 05/22/22   Raulkar, Drema Pry, MD  gabapentin (NEURONTIN) 100 MG capsule Take 1 capsule (100 mg total) by mouth 2 (two) times daily. 05/24/22   Raulkar, Drema Pry, MD  hydrALAZINE (APRESOLINE) 50 MG tablet Take 1 tablet (50 mg total) by mouth every 6 (six) hours. 06/22/22   Raulkar, Drema Pry, MD  hydrOXYzine (ATARAX) 10 MG tablet Take 1 tablet (10 mg total) by mouth 3 (three) times daily as needed. 06/09/22   Raulkar, Drema Pry, MD  losartan (COZAAR) 25 MG tablet Take 1 tablet (25 mg total) by mouth daily. 06/09/22   Raulkar, Drema Pry, MD  magnesium gluconate (MAGONATE) 500 MG tablet Take 1 tablet (500 mg total) by mouth at bedtime. 05/22/22   Raulkar, Drema Pry, MD  Multiple Vitamin (MULTIVITAMIN WITH MINERALS) TABS tablet Take 1 tablet by mouth daily. 05/22/22   Raulkar, Drema Pry, MD  potassium chloride (KLOR-CON) 20 MEQ packet Take 20 mEq by mouth daily. 05/22/22   Raulkar, Drema Pry, MD  sertraline (ZOLOFT) 50 MG tablet Take 1 tablet (50 mg total) by mouth at bedtime. 06/22/22   Raulkar, Drema Pry, MD  tiZANidine (ZANAFLEX) 4 MG tablet Take 1 tablet (4 mg total) by mouth at bedtime. 05/31/22   Raulkar, Drema Pry, MD      Allergies    Lisinopril    Review of Systems   Review of Systems  Unable to perform ROS: Mental status change  Constitutional:  Negative for fever.  Respiratory:  Positive for shortness of breath. Negative for cough.   Cardiovascular:  Negative for chest pain.  Gastrointestinal:  Negative for abdominal pain, nausea and vomiting.  Neurological:  Positive for weakness.    Physical  Exam Updated Vital Signs BP (!) 175/98   Pulse 95   Resp 19   SpO2 100%  Physical Exam Vitals and nursing note reviewed.  Constitutional:      General: She is not in acute distress.    Appearance: Normal appearance. She is well-developed.  HENT:     Head: Normocephalic and atraumatic.  Eyes:     Conjunctiva/sclera: Conjunctivae normal.  Neck:     Comments: She has a healing trach scar.  She has some raspy breathing although no clear stridor Cardiovascular:     Rate and Rhythm: Normal rate and regular rhythm.     Heart sounds: No murmur heard. Pulmonary:     Effort: Pulmonary effort is normal. No respiratory distress.     Breath sounds: Normal breath sounds.  Abdominal:     Palpations: Abdomen is soft.     Tenderness: There is no abdominal tenderness. There is no guarding or rebound.  Musculoskeletal:        General: No deformity.     Cervical back: Neck supple.     Right lower leg: No edema.     Left lower leg: No edema.  Skin:    General: Skin is warm and dry.     Capillary Refill: Capillary refill takes less than 2 seconds.  Neurological:     Comments: She has some spasticity and minimal use of left arm and left leg.  She has moderate use of right arm right leg.     ED Results / Procedures / Treatments   Labs (all labs ordered are listed, but only abnormal results are displayed) Labs Reviewed  COMPREHENSIVE METABOLIC PANEL  CBC WITH DIFFERENTIAL/PLATELET  URINALYSIS, ROUTINE W REFLEX MICROSCOPIC  BRAIN NATRIURETIC PEPTIDE  TROPONIN I (HIGH SENSITIVITY)    EKG None  Radiology No results found.  Procedures Procedures  {Document cardiac monitor, telemetry assessment procedure when appropriate:1}  Medications Ordered in ED Medications  sodium chloride 0.9 % bolus 500 mL (has no administration in time range)    ED Course/ Medical Decision Making/ A&P                           Medical Decision Making Amount and/or Complexity of Data Reviewed Labs:  ordered. Radiology: ordered.  This patient complains of ***; this involves an extensive number of treatment Options and is a complaint that carries with it a high risk of complications and morbidity. The differential includes ***  I ordered, reviewed and interpreted labs, which included *** I ordered medication *** and reviewed PMP when indicated. I ordered imaging studies which included *** and I independently    visualized and interpreted imaging which showed *** Additional history obtained from *** Previous records obtained and reviewed *** I consulted ***  and discussed lab and imaging findings and discussed disposition.  Cardiac monitoring reviewed, *** Social determinants considered, *** Critical Interventions: ***  After the interventions stated above, I reevaluated the patient and found *** Admission and further testing considered, ***    {Document critical care time when appropriate:1} {Document review of labs and clinical decision tools ie heart score, Chads2Vasc2 etc:1}  {Document your independent review of radiology images, and any outside records:1} {Document your discussion with family members, caretakers, and with consultants:1} {Document social determinants of health affecting pt's care:1} {Document your decision making why or why not admission, treatments were needed:1} Final Clinical Impression(s) / ED Diagnoses Final diagnoses:  None    Rx / DC Orders ED Discharge Orders     None

## 2022-06-23 NOTE — ED Notes (Signed)
Unable to obtain IV access. [place] IV consult ordered.

## 2022-06-23 NOTE — H&P (Signed)
[place]   PATIENT NAME: Sarah Myers    MR#:  [id number]  DATE OF BIRTH:  1970/12/20  DATE OF ADMISSION:  06/23/2022  PRIMARY CARE PHYSICIAN: Shayne Alken, MD   Patient is coming from: Home  REQUESTING/REFERRING PHYSICIAN: Meridee Score, MD  CHIEF COMPLAINT:   Chief Complaint  Patient presents with   Weakness   Altered Mental Status    HISTORY OF PRESENT ILLNESS:  Sarah Myers is a 51 y.o. 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  She denies any chest pain or dyspnea or cough.  She had subjective fever and chills.  No rhinorrhea or nasal congestion or sore throat.  No nausea or vomiting or diarrhea or abdominal pain.  ED Course: Upon presentation to the emergency room, BP was 171/101 with otherwise normal vital signs.  Later temperature was up to 102/38.9 and with Tylenol came down to 99.6/37.6.  And labs revealed hypernatremia and hyperchloremia.  Creatinine was 1.09 and calcium 12.7.  Total protein was 8.3.  CBC was within normal.  UA showed 30 protein and 6-10 WBCs with few bacteria and small leukocytes. EKG as reviewed by me : EKG showed normal sinus rhythm with a rate of 95 with poor R wave progression and nonspecific intraventricular conduction delay. Imaging: Portable chest ray showed no acute cardiopulmonary disease.  Noncontrasted CT scan revealed patchy hypodensity in the white matter probably chronic small vessel disease with no acute intracranial abnormalities.  The patient was cleared by 6 and a 50 mg of p.o. Tylenol and 1.5 L bolus of IV normal saline and 1 g of IV Rocephin.  She will be admitted to AN  observation telemetry bed for further evaluation and management. PAST MEDICAL HISTORY:   Past Medical History:  Diagnosis Date   Hypertension   -Hemorrhagic CVA with subsequent left hemiplegia and dysarthria PAST SURGICAL HISTORY:   Past Surgical History:  Procedure Laterality Date   BREAST LUMPECTOMY  1992   CESAREAN SECTION     IR GASTROSTOMY TUBE MOD SED  03/06/2022   ORIF RADIAL FRACTURE  02/10/2012   Procedure: OPEN REDUCTION INTERNAL FIXATION (ORIF) RADIAL FRACTURE;  Surgeon: Cammy Copa, MD;  Location: [facility] OR;  Service: Orthopedics;  Laterality: Right;   ORIF ULNAR FRACTURE  02/10/2012   Procedure: OPEN REDUCTION INTERNAL FIXATION (ORIF) ULNAR FRACTURE;  Surgeon: Cammy Copa, MD;  Location: [facility] OR;  Service: Orthopedics;  Laterality: Right;   TUBAL LIGATION      SOCIAL HISTORY:   Social History   Tobacco Use   Smoking status: Never   Smokeless tobacco: Never  Substance Use Topics   Alcohol use: Not Currently    Alcohol/week: 0.0 standard drinks of alcohol    FAMILY HISTORY:   Family History  Problem Relation Age of Onset   Diabetes Mother    Hypertension Mother    Heart disease Mother    Diabetes Maternal Aunt    Stroke Maternal Uncle    Diabetes Maternal Grandmother    Heart disease Maternal Grandmother    Diabetes Maternal Grandfather    Heart disease Maternal Grandfather    Heart disease Paternal Grandmother    Heart disease Paternal Grandfather  DRUG ALLERGIES:   Allergies  Allergen Reactions   Lisinopril Cough    REVIEW OF SYSTEMS:   ROS As per history of present illness. All pertinent systems were reviewed above. Constitutional, HEENT, cardiovascular, respiratory, GI, GU, musculoskeletal, neuro, psychiatric, endocrine, integumentary and hematologic systems were reviewed and are otherwise negative/unremarkable except for positive findings mentioned above in the HPI.   MEDICATIONS AT HOME:   Prior to Admission medications    Medication Sig Start Date End Date Taking? Authorizing Provider  acetaminophen (TYLENOL) 500 MG tablet Take 500-1,000 mg by mouth every 6 (six) hours as needed for moderate pain.   Yes [provider]  amantadine (SYMMETREL) 100 MG capsule Take 1 capsule (100 mg total) by mouth 2 (two) times daily. 06/22/22  Yes Raulkar, Clide Deutscher, MD  amLODipine (NORVASC) 10 MG tablet Take 1 tablet (10 mg total) by mouth daily. 06/22/22  Yes Raulkar, Clide Deutscher, MD  apixaban (ELIQUIS) 5 MG TABS tablet Take 1 tablet (5 mg total) by mouth 2 (two) times daily. 06/22/22  Yes Raulkar, Clide Deutscher, MD  baclofen (LIORESAL) 10 MG tablet Take 5-15 mg by mouth 2 (two) times daily. Take 1/2 tablet (5 mg) and Take 1.5 tablets (15 mg) at bedtime 06/13/22  Yes [provider]  carvedilol (COREG) 6.25 MG tablet Take 1 tablet (6.25 mg total) by mouth 2 (two) times daily with a meal. 06/22/22  Yes Raulkar, Clide Deutscher, MD  famotidine (PEPCID) 20 MG tablet Take 1 tablet (20 mg total) by mouth 2 (two) times daily. Patient taking differently: Take 20 mg by mouth 2 (two) times daily as needed for heartburn or indigestion. 05/22/22  Yes Raulkar, Clide Deutscher, MD  gabapentin (NEURONTIN) 100 MG capsule Take 1 capsule (100 mg total) by mouth 2 (two) times daily. Patient taking differently: Take 100 mg by mouth 2 (two) times daily as needed (nerve pain). 05/24/22  Yes Raulkar, Clide Deutscher, MD  hydrALAZINE (APRESOLINE) 50 MG tablet Take 1 tablet (50 mg total) by mouth every 6 (six) hours. 06/22/22  Yes Raulkar, Clide Deutscher, MD  hydrOXYzine (ATARAX) 10 MG tablet Take 1 tablet (10 mg total) by mouth 3 (three) times daily as needed. Patient taking differently: Take 10 mg by mouth 3 (three) times daily as needed for anxiety. 06/09/22  Yes Raulkar, Clide Deutscher, MD  losartan (COZAAR) 25 MG tablet Take 1 tablet (25 mg total) by mouth daily. 06/09/22  Yes Raulkar, Clide Deutscher, MD  magnesium gluconate (MAGONATE) 500 MG tablet Take 1 tablet (500 mg total) by mouth  at bedtime. 05/22/22  Yes Raulkar, Clide Deutscher, MD  Multiple Vitamin (MULTIVITAMIN WITH MINERALS) TABS tablet Take 1 tablet by mouth daily. 05/22/22  Yes Raulkar, Clide Deutscher, MD  potassium chloride (KLOR-CON) 20 MEQ packet Take 20 mEq by mouth daily. Patient taking differently: Take 20 mEq by mouth daily as needed (low potassium). 05/22/22  Yes Raulkar, Clide Deutscher, MD  sertraline (ZOLOFT) 50 MG tablet Take 1 tablet (50 mg total) by mouth at bedtime. Patient taking differently: Take 25 mg by mouth at bedtime. 06/22/22  Yes Raulkar, Clide Deutscher, MD  acetaminophen (TYLENOL) 325 MG tablet Take 1-2 tablets (325-650 mg total) by mouth every 4 (four) hours as needed for mild pain. Patient not taking: Reported on 06/23/2022 04/26/22   Setzer, Edman Circle, PA-C  busPIRone (BUSPAR) 5 MG tablet Take 1 tablet (5 mg total) by mouth 3 (three) times daily as needed. 06/23/22   Raulkar, Clide Deutscher, MD  tiZANidine (ZANAFLEX) 4 MG  tablet Take 1 tablet (4 mg total) by mouth at bedtime. Patient not taking: Reported on 06/23/2022 05/31/22   Izora Ribas, MD      VITAL SIGNS:  Blood pressure (!) 169/98, pulse 94, temperature 99.6 F (37.6 C), temperature source Rectal, resp. rate 19, SpO2 98 %.  PHYSICAL EXAMINATION:  Physical Exam  GENERAL:  51 y.o.-year-old African-American female patient lying in the bed with no acute distress.  EYES: Pupils equal, round, reactive to light and accommodation. No scleral icterus. Extraocular muscles intact.  HEENT: Head atraumatic, normocephalic. Oropharynx and nasopharynx clear.  NECK:  Supple, no jugular venous distention. No thyroid enlargement, no tenderness.  LUNGS: Normal breath sounds bilaterally, no wheezing, rales,rhonchi or crepitation. No use of accessory muscles of respiration.  CARDIOVASCULAR: Regular rate and rhythm, S1, S2 normal. No murmurs, rubs, or gallops.  ABDOMEN: Soft, nondistended, nontender. Bowel sounds present. No organomegaly or mass.  EXTREMITIES: No pedal edema,  cyanosis, or clubbing.  NEUROLOGIC: Cranial nerves II through XII are intact except for dysarthria.  She has left-sided hemiplegia.  Sensation intact. Gait not checked.  PSYCHIATRIC: The patient is alert and oriented x 3.  Normal affect and good eye contact. SKIN: No obvious rash, lesion, or ulcer.   LABORATORY PANEL:   CBC Recent Labs  Lab 06/23/22 1852  WBC 6.0  HGB 13.7  HCT 41.5  PLT 193   ------------------------------------------------------------------------------------------------------------------  Chemistries  Recent Labs  Lab 06/23/22 1852  NA 147*  K 3.8  CL 113*  CO2 22  GLUCOSE 104*  BUN 20  CREATININE 1.09*  CALCIUM 12.7*  AST 16  ALT 23  ALKPHOS 75  BILITOT 0.9   ------------------------------------------------------------------------------------------------------------------  Cardiac Enzymes No results for input(s): "TROPONINI" in the last 168 hours. ------------------------------------------------------------------------------------------------------------------  RADIOLOGY:  CT Head Wo Contrast  Result Date: 06/23/2022 CLINICAL DATA:  Weakness and altered mental EXAM: CT HEAD WITHOUT CONTRAST TECHNIQUE: Contiguous axial images were obtained from the base of the skull through the vertex without intravenous contrast. RADIATION DOSE REDUCTION: This exam was performed according to the departmental dose-optimization program which includes automated exposure control, adjustment of the mA and/or kV according to patient size and/or use of iterative reconstruction technique. COMPARISON:  CT 02/26/2022 FINDINGS: Brain: No acute territorial infarction, hemorrhage, or intracranial mass. The previously noted right intraparenchymal hematoma has resolved. Mild wallerian degeneration of the right peduncle and brainstem. Patchy hypodensity in the bilateral white matter likely chronic small vessel disease. Nonenlarged ventricles. Vascular: No hyperdense vessels.  No  unexpected calcification Skull: Normal. Negative for fracture or focal lesion. Sinuses/Orbits: Ethmoid sinus disease. Other: None IMPRESSION: 1. No CT evidence for acute intracranial abnormality. 2. Patchy hypodensity in the white matter, probable chronic small vessel disease. Electronically Signed   By: Donavan Foil M.D.   On: 06/23/2022 20:32   DG Chest Port 1 View  Result Date: 06/23/2022 CLINICAL DATA:  Generalized weakness with altered mental status. EXAM: PORTABLE CHEST 1 VIEW COMPARISON:  03/22/2022 FINDINGS: The heart size and mediastinal contours are within normal limits. Both lungs are clear. The visualized skeletal structures are unremarkable. IMPRESSION: No active disease. Electronically Signed   By: Ronney Asters M.D.   On: 06/23/2022 17:01      IMPRESSION AND PLAN:  Assessment and Plan: * Sepsis due to gram-negative UTI [facility]) - She will be admitted to an observation medical telemetry bed. - We will continue antibiotic therapy with IV Rocephin. - She will be hydrated with IV normal saline. - We will follow blood  and urine cultures.  Altered mental status - This is likely metabolic encephalopathy due to her UTI. - We will follow neurochecks every 4 hours for 24 hours. - Her noncontrasted CT scan showed no acute findings  Hypercalcemia - This could be contributing to altered mental status. - She will be hydrated with IV normal saline as mentioned above. - I ordered 1 dose of 20 mg of IV Lasix. - We will monitor calcium levels  Anxiety and depression We will continue BuSpar and Zoloft.  History of pulmonary embolism We will continue Eliquis.  History of CVA (cerebrovascular accident) - The patient has residual left hemiplegia. - We will continue baclofen and and Neurontin for associated peripheral neuropathy.  Essential hypertension We will continue amlodipine,, Cozaar, Coreg and hydralazine   DVT prophylaxis: Lovenox.  Advanced Care Planning:  Code Status: full  code.  Family Communication:  The plan of care was discussed in details with the patient (and family). I answered all questions. The patient agreed to proceed with the above mentioned plan. Further management will depend upon hospital course. Disposition Plan: Back to previous home environment Consults called: none.  All the records are reviewed and case discussed with ED provider.  Status is: Observation   I certify that at the time of admission, it is my clinical judgment that the patient will require hospital care extending less than 2 midnights.                            Dispo: The patient is from: Home              Anticipated d/c is to: Home              Patient currently is not medically stable to d/c.              Difficult to place patient: No  Hannah Beat M.D on 06/24/2022 at 12:27 AM  [facility]   From 7 PM-7 AM, contact night-coverage [email or web address]  CC: Primary care physician; Shayne Alken, MD

## 2022-06-23 NOTE — Progress Notes (Signed)
Subjective:    Patient ID: Sarah Myers, female    DOB: 10-13-1971, 51 y.o.   MRN: [id number]  HPI An audio/video tele-health visit is felt to be the most appropriate encounter for this patient at this time. This is a follow up tele-visit via phone. The patient is at home. MD is at office. Prior to scheduling this appointment, our staff discussed the limitations of evaluation and management by telemedicine and the availability of in-person appointments. The patient expressed understanding and agreed to proceed.   Sarah Myers presents for f/u of insomnia, impaired mobility and ADLs due to stroke, and spasticity  1) Insomnia -sleeping better now  2) Spasticity -still tight in upper and lower extremities at times -daughter thinks patient developed hallucinations in response to tizanidine and these have resolved since she has stopped tizanidine -she has restarted Baclofen  3) Swelling of all limbs -improved -the DME company no longer provides compression garments  4) Frequent urination -improved -no dysuria  5) Impaired mobility -her daughter would like to be able to bring her to her doctor's appointments but is unable to due to lack of transportation. 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   6) Hallucinations: -daughter notes these have started after she started taking Tizanidine -has resolved since tizanidine has stopped  7) Anxiety -daughter notes it has been severe -she has picked up hydroxyzine and will try this, daughter feels this has helped -she is having a lot of anxiety this morning -her daughter gave her hydroxyzine at 5am and asks what she should do now -she is willing to try additional anxiety medication in between hydroxyzine if this is not effective enough -she is willing to speak  with a pyschiatrist  8) Impaired memory and cognition -daughter notes impaired memory and cognition and asks if her mom could be getting early dementia, she asks what can be done for this, she asks if any testing is needed   Review of Systems +swelling of all limbs, +spasticity, +insomnia, +hallucinations    Objective:   Physical Exam Not performed       Assessment & Plan:   1) Insomnia -improved -d/c tizanidine and restart baclofen  2) Spasticity -d/c tizanidine and restart baclofen, check CMP next office visit to assess LFTs while on Tizanidine -discussed Botox and daughter will consider  3) Swelling of limbs: -improved -prescribed extra large compression garments for bilateral lower extremities, small for bilateral upper extremities, discussed that she could purchase these online and send receipt to insurance for reimbursement  4) Impaired mobility and ADLs -reached out to SW and emailed patient [employer] forms to complete for transportation -helped daughter to fill out each item on the [employer] forms and asked her to mail forms to our office for [place] to send on her behalf since she does not have a fax machine -discussed every question on [employer] form and guiding daughter on how to answer questions accurately based on her mother's condition. There is a Part B on the form that needs to be filled by [place], discussed with daughter that she can mail this to our office and I can completed this portion before we fax the form for her, discussed with patient and daughter her major limitations of left sided weakness and how this inhibits her from walking,  her inability to navigate the stairs in her apartment which has thus far prevented her from leaving her apartment, the fact that she does not have a ramp, her difficulty in getting to and from doctor's appointments due to this impaired mobility, the fact that she does not have a wheelchair ramp at home -messaged Becky Dupree to call  patient to answer some of her questions -provided note stating that daughter has been unable to work due to caring for her mother full time at home  4) Anxiety -prescribed hydroxyzine prn -prescribed buspar 5mg  TID PRN for use in between hydroxyzine if this is not effective enough -referred to psychiatry -discussed eating 2 nuts per day -discussed decreasing Zoloft to 25mg  since daughter would prefer to use PRN medication for her -discussed benefits of meditation and exercise  5) Polypharmacy -reviewed all medications, their indications, and potential side effects with daughter.   6) GERD -recommended using pepcid PRN  7) HTN -continue Cozaar -drink 6-8 glasses of water per day to protect kidneys while using Cozaar -continue magnesium supplement  8) History of CVA -continue multivitamin  9) Cognitive impairment:  -discussed that cognitive impairment can be secondary to stroke, her baclofen medication -discussed that neurology can help with cognitive testing to assess for dementia -discussed patient's risk for vascular demential given stroke and cardiac history -recommended avoiding processed foods such as added sugar, bread/pasta/rice -discussed that insulin resistance can worsen cognitive impairment -recommended eating fruits, vegetables, lean meats, olive oil, nuts, and yogurt. Highlighted salmon, blueberries and walnuts as foods that enhance cognition  13 minutes spent in discussion of anxiety, recommended referral to psychiatry, prescribed Buspar 5mg  TID PRN to use in between hydroxyzine if this is not effective enough, recommended eating 2 [place] nuts per day, recommended clearing mind with meditation or practicing gratitude meditation, discussed that we are working on Eliquis prior auth for her and that most other anticoagulation medications are equally expensive

## 2022-06-23 NOTE — Telephone Encounter (Addendum)
Sarah Myers with [facility] called:   Patient was recently prescribed medication for anxiety. Sarah Myers is seeking a verbal order for a in-home nursing assessment evaluation  Also to make sure she is taking her medications as directed.   Call back phone [phone].  Return call placed: Okay given to [facility] per Dr. Carlis Abbott.

## 2022-06-24 DIAGNOSIS — Z7901 Long term (current) use of anticoagulants: Secondary | ICD-10-CM | POA: Diagnosis not present

## 2022-06-24 DIAGNOSIS — I10 Essential (primary) hypertension: Secondary | ICD-10-CM

## 2022-06-24 DIAGNOSIS — Z86711 Personal history of pulmonary embolism: Secondary | ICD-10-CM | POA: Diagnosis not present

## 2022-06-24 DIAGNOSIS — R410 Disorientation, unspecified: Secondary | ICD-10-CM | POA: Diagnosis not present

## 2022-06-24 DIAGNOSIS — N390 Urinary tract infection, site not specified: Secondary | ICD-10-CM

## 2022-06-24 DIAGNOSIS — A630 Anogenital (venereal) warts: Secondary | ICD-10-CM | POA: Diagnosis present

## 2022-06-24 DIAGNOSIS — E870 Hyperosmolality and hypernatremia: Secondary | ICD-10-CM | POA: Diagnosis present

## 2022-06-24 DIAGNOSIS — F411 Generalized anxiety disorder: Secondary | ICD-10-CM | POA: Diagnosis present

## 2022-06-24 DIAGNOSIS — N9489 Other specified conditions associated with female genital organs and menstrual cycle: Secondary | ICD-10-CM | POA: Diagnosis present

## 2022-06-24 DIAGNOSIS — Z8249 Family history of ischemic heart disease and other diseases of the circulatory system: Secondary | ICD-10-CM | POA: Diagnosis not present

## 2022-06-24 DIAGNOSIS — R4182 Altered mental status, unspecified: Secondary | ICD-10-CM | POA: Diagnosis present

## 2022-06-24 DIAGNOSIS — F419 Anxiety disorder, unspecified: Secondary | ICD-10-CM

## 2022-06-24 DIAGNOSIS — Z20822 Contact with and (suspected) exposure to covid-19: Secondary | ICD-10-CM | POA: Diagnosis present

## 2022-06-24 DIAGNOSIS — I69322 Dysarthria following cerebral infarction: Secondary | ICD-10-CM | POA: Diagnosis not present

## 2022-06-24 DIAGNOSIS — A4150 Gram-negative sepsis, unspecified: Secondary | ICD-10-CM | POA: Diagnosis not present

## 2022-06-24 DIAGNOSIS — E878 Other disorders of electrolyte and fluid balance, not elsewhere classified: Secondary | ICD-10-CM | POA: Diagnosis present

## 2022-06-24 DIAGNOSIS — Z888 Allergy status to other drugs, medicaments and biological substances status: Secondary | ICD-10-CM | POA: Diagnosis not present

## 2022-06-24 DIAGNOSIS — Z79899 Other long term (current) drug therapy: Secondary | ICD-10-CM | POA: Diagnosis not present

## 2022-06-24 DIAGNOSIS — B9620 Unspecified Escherichia coli [E. coli] as the cause of diseases classified elsewhere: Secondary | ICD-10-CM | POA: Diagnosis present

## 2022-06-24 DIAGNOSIS — I69354 Hemiplegia and hemiparesis following cerebral infarction affecting left non-dominant side: Secondary | ICD-10-CM | POA: Diagnosis not present

## 2022-06-24 DIAGNOSIS — F32A Depression, unspecified: Secondary | ICD-10-CM | POA: Diagnosis present

## 2022-06-24 DIAGNOSIS — E876 Hypokalemia: Secondary | ICD-10-CM | POA: Diagnosis not present

## 2022-06-24 DIAGNOSIS — G9341 Metabolic encephalopathy: Secondary | ICD-10-CM | POA: Diagnosis present

## 2022-06-24 DIAGNOSIS — Z823 Family history of stroke: Secondary | ICD-10-CM | POA: Diagnosis not present

## 2022-06-24 DIAGNOSIS — G629 Polyneuropathy, unspecified: Secondary | ICD-10-CM | POA: Diagnosis present

## 2022-06-24 DIAGNOSIS — Z8673 Personal history of transient ischemic attack (TIA), and cerebral infarction without residual deficits: Secondary | ICD-10-CM

## 2022-06-24 LAB — RAPID HIV SCREEN (HIV 1/2 AB+AG)
HIV 1/2 Antibodies: NONREACTIVE
HIV-1 P24 Antigen - HIV24: NONREACTIVE

## 2022-06-24 LAB — PROTIME-INR
INR: 1.4 — ABNORMAL HIGH (ref 0.8–1.2)
Prothrombin Time: 17.1 seconds — ABNORMAL HIGH (ref 11.4–15.2)

## 2022-06-24 LAB — CBC
HCT: 37.9 % (ref 36.0–46.0)
HCT: 35.1 % — ABNORMAL LOW (ref 36.0–46.0)
Hemoglobin: 12.6 g/dL (ref 12.0–15.0)
Hemoglobin: 11.4 g/dL — ABNORMAL LOW (ref 12.0–15.0)
MCH: 28.6 pg (ref 26.0–34.0)
MCH: 27.9 pg (ref 26.0–34.0)
MCHC: 33.2 g/dL (ref 30.0–36.0)
MCHC: 32.5 g/dL (ref 30.0–36.0)
MCV: 85.9 fL (ref 80.0–100.0)
MCV: 85.8 fL (ref 80.0–100.0)
Platelets: 169 10*3/uL (ref 150–400)
Platelets: 163 10*3/uL (ref 150–400)
RBC: 4.41 MIL/uL (ref 3.87–5.11)
RBC: 4.09 MIL/uL (ref 3.87–5.11)
RDW: 12.5 % (ref 11.5–15.5)
RDW: 12.5 % (ref 11.5–15.5)
WBC: 5.8 10*3/uL (ref 4.0–10.5)
WBC: 6.3 10*3/uL (ref 4.0–10.5)
nRBC: 0 % (ref 0.0–0.2)
nRBC: 0 % (ref 0.0–0.2)

## 2022-06-24 LAB — COMPREHENSIVE METABOLIC PANEL
ALT: 20 U/L (ref 0–44)
AST: 23 U/L (ref 15–41)
Albumin: 3.7 g/dL (ref 3.5–5.0)
Alkaline Phosphatase: 66 U/L (ref 38–126)
Anion gap: 11 (ref 5–15)
BUN: 15 mg/dL (ref 6–20)
CO2: 22 mmol/L (ref 22–32)
Calcium: 11.4 mg/dL — ABNORMAL HIGH (ref 8.9–10.3)
Chloride: 112 mmol/L — ABNORMAL HIGH (ref 98–111)
Creatinine, Ser: 1.02 mg/dL — ABNORMAL HIGH (ref 0.44–1.00)
GFR, Estimated: >60 mL/min (ref >60)
Glucose, Bld: 97 mg/dL (ref 70–99)
Potassium: 3.4 mmol/L — ABNORMAL LOW (ref 3.5–5.1)
Sodium: 145 mmol/L (ref 135–145)
Total Bilirubin: 0.9 mg/dL (ref 0.3–1.2)
Total Protein: 7 g/dL (ref 6.5–8.1)

## 2022-06-24 LAB — BASIC METABOLIC PANEL
Anion gap: 9 (ref 5–15)
BUN: 17 mg/dL (ref 6–20)
CO2: 23 mmol/L (ref 22–32)
Calcium: 11.5 mg/dL — ABNORMAL HIGH (ref 8.9–10.3)
Chloride: 112 mmol/L — ABNORMAL HIGH (ref 98–111)
Creatinine, Ser: 1.05 mg/dL — ABNORMAL HIGH (ref 0.44–1.00)
GFR, Estimated: >60 mL/min (ref >60)
Glucose, Bld: 110 mg/dL — ABNORMAL HIGH (ref 70–99)
Potassium: 3.4 mmol/L — ABNORMAL LOW (ref 3.5–5.1)
Sodium: 144 mmol/L (ref 135–145)

## 2022-06-24 LAB — LACTIC ACID, PLASMA
Lactic Acid, Venous: 0.8 mmol/L (ref 0.5–1.9)
Lactic Acid, Venous: 0.9 mmol/L (ref 0.5–1.9)
Lactic Acid, Venous: 1.8 mmol/L (ref 0.5–1.9)

## 2022-06-24 LAB — HEPATITIS PANEL, ACUTE
HCV Ab: NONREACTIVE
Hep A IgM: NONREACTIVE
Hep B C IgM: NONREACTIVE
Hepatitis B Surface Ag: NONREACTIVE

## 2022-06-24 LAB — APTT: aPTT: 39 seconds — ABNORMAL HIGH (ref 24–36)

## 2022-06-24 MED ORDER — LORAZEPAM 2 MG/ML IJ SOLN
0.5000 mg | Freq: Four times a day (QID) | INTRAMUSCULAR | Status: DC | PRN
Start: 2022-06-24 — End: 2022-06-26
  Administered 2022-06-24: 0.5 mg via INTRAVENOUS
  Filled 2022-06-24: qty 1

## 2022-06-24 MED ORDER — GABAPENTIN 100 MG PO CAPS
100.0000 mg | ORAL_CAPSULE | Freq: Two times a day (BID) | ORAL | Status: DC
  Administered 2022-06-24 – 2022-06-26 (×5): 100 mg via ORAL
  Filled 2022-06-24 (×5): qty 1

## 2022-06-24 MED ORDER — MORPHINE SULFATE (PF) 2 MG/ML IV SOLN
2.0000 mg | INTRAVENOUS | Status: DC | PRN
  Administered 2022-06-24: 2 mg via INTRAVENOUS
  Filled 2022-06-24: qty 1

## 2022-06-24 MED ORDER — HYDRALAZINE HCL 20 MG/ML IJ SOLN
10.0000 mg | Freq: Four times a day (QID) | INTRAMUSCULAR | Status: DC | PRN
Start: 2022-06-24 — End: 2022-06-26

## 2022-06-24 MED ORDER — HALOPERIDOL LACTATE 5 MG/ML IJ SOLN
2.0000 mg | Freq: Four times a day (QID) | INTRAMUSCULAR | Status: DC | PRN
Start: 2022-06-24 — End: 2022-06-26
  Administered 2022-06-24 – 2022-06-25 (×2): 2 mg via INTRAVENOUS
  Filled 2022-06-24 (×2): qty 1

## 2022-06-24 MED ORDER — SODIUM CHLORIDE 0.9 % IV SOLN
2.0000 g | INTRAVENOUS | Status: DC
  Administered 2022-06-24 – 2022-06-25 (×2): 2 g via INTRAVENOUS
  Filled 2022-06-24 (×3): qty 20

## 2022-06-24 MED ORDER — OXYCODONE HCL 5 MG PO TABS
5.0000 mg | ORAL_TABLET | ORAL | Status: DC | PRN
  Administered 2022-06-24 – 2022-06-25 (×3): 5 mg via ORAL
  Filled 2022-06-24 (×3): qty 1

## 2022-06-24 MED ORDER — POTASSIUM CHLORIDE CRYS ER 20 MEQ PO TBCR
40.0000 meq | EXTENDED_RELEASE_TABLET | Freq: Once | ORAL | Status: AC
Start: 2022-06-24 — End: 2022-06-24
  Administered 2022-06-24: 40 meq via ORAL
  Filled 2022-06-24: qty 2

## 2022-06-24 NOTE — Assessment & Plan Note (Signed)
-   The patient has residual left hemiplegia. - We will continue baclofen and and Neurontin for associated peripheral neuropathy.

## 2022-06-24 NOTE — Progress Notes (Signed)
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  Daughter is at bedside and provides most of the history.  Shortly upon arrival in the ED she was found to be febrile to 102  Subjective / 24h Interval events: Appears uncomfortable this morning due to left-sided pain.  She denies shortness of breath.  Assesement and Plan: Principal Problem:   Sepsis due to gram-negative UTI (HCC) Active Problems:   Altered mental status   Hypercalcemia   Essential hypertension   History of CVA (cerebrovascular accident)   History of pulmonary embolism   Anxiety and depression   Principal problem SIRS-patient febrile to 102 and tachypneic.  Urinalysis could suggest an infection but daughter and patient deny any significant urinary symptoms.  Monitor cultures, given fever last night keep on antibiotics for now  Active problems Acute metabolic encephalopathy, restlessness-daughter appreciates improvement but has not yet returned to baseline.  She is still quite restless this morning.  Continue to closely monitor mental status.  History of hemorrhagic CVA-status post prolonged hospitalization, now with residual left-sided weakness and spasticity.  Being followed by rehab MD on a regular basis.  History of bilateral PE-cleared for anticoagulation by neurology in April.  Continue Eliquis  Essential hypertension-continue amlodipine, Coreg, losartan  GAD-continue home medications  Left-sided pain-somewhat chronic, alternate gabapentin, oxycodone, Tylenol  Hypercalcemia-repeat calcium this morning   Scheduled Meds:   amantadine  100 mg Oral BID   amLODipine  10 mg Oral Daily   apixaban  5 mg Oral BID   baclofen  5-15 mg Oral BID   carvedilol  6.25 mg Oral BID WC   gabapentin  100 mg Oral BID   hydrALAZINE  50 mg Oral Q6H   losartan  25 mg Oral Daily   magnesium gluconate  500 mg Oral QHS   multivitamin with minerals  1 tablet Oral Daily   sertraline  25 mg Oral QHS   Continuous Infusions:  sodium chloride 125 mL/hr at 06/24/22 0915   cefTRIAXone (ROCEPHIN)  IV     PRN Meds:.acetaminophen **OR** acetaminophen, busPIRone, famotidine, hydrOXYzine, magnesium hydroxide, ondansetron **OR** ondansetron (ZOFRAN) IV, oxyCODONE, traZODone  Diet Orders (From admission, onward)     Start     Ordered   06/23/22 2206  Diet Heart Room service appropriate? Yes; Fluid consistency: Thin  Diet effective now       Question Answer Comment  Room service appropriate? Yes   Fluid consistency: Thin      06/23/22 2206            DVT prophylaxis:  apixaban (ELIQUIS) tablet 5 mg   Lab Results  Component Value Date   PLT 169 06/24/2022      Code Status: Full Code  Family Communication: daughter at bedside   Status is: Observation The patient will require care spanning > 2 midnights and should be moved to inpatient because: Encephalopathy, febrile   Level of care: Telemetry  Consultants:  None   Objective: Vitals:   06/24/22 0730 06/24/22 0930 06/24/22 1000 06/24/22 1030  BP: (!) 172/111 (!) 153/111 (!) 162/99 (!) 162/96  Pulse: 91 94 89 81  Resp: (!) 21 20 (!) 22   Temp:  TempSrc:      SpO2: 99% 99% 100% 100%    Intake/Output Summary (Last 24 hours) at 06/24/2022 1111 Last data filed at 06/24/2022 0746 Gross per 24 hour  Intake 98.5 ml  Output 1500 ml  Net -1401.5 ml   Wt Readings from Last 3 Encounters:  04/26/22 66 kg  03/21/22 74.1 kg  09/19/21 66.2 kg    Examination:  Constitutional: NAD Eyes: no scleral icterus ENMT: Mucous membranes are moist.  Neck: normal,  supple Respiratory: clear to auscultation bilaterally, no wheezing, no crackles.  Cardiovascular: Regular rate and rhythm, no murmurs / rubs / gallops.  Left-sided edema Abdomen: non distended, no tenderness. Bowel sounds positive.  Musculoskeletal: no clubbing / cyanosis.  Skin: no rashes Neurologic: Left-sided weakness, spasticity   Data Reviewed: I have independently reviewed following labs and imaging studies   CBC Recent Labs  Lab 06/23/22 1852 06/24/22 0443  WBC 6.0 5.8  HGB 13.7 12.6  HCT 41.5 37.9  PLT 193 169  MCV 86.5 85.9  MCH 28.5 28.6  MCHC 33.0 33.2  RDW 12.5 12.5  LYMPHSABS 1.8  --   MONOABS 0.5  --   EOSABS 0.0  --   BASOSABS 0.1  --     Recent Labs  Lab 06/23/22 0111 06/23/22 1835 06/23/22 1851 06/23/22 1852 06/23/22 1853 06/23/22 2056 06/23/22 2213 06/24/22 0111 06/24/22 0443  NA  --   --   --  147*  --   --   --   --  144  K  --   --   --  3.8  --   --   --   --  3.4*  CL  --   --   --  113*  --   --   --   --  112*  CO2  --   --   --  22  --   --   --   --  23  GLUCOSE  --   --   --  104*  --   --   --   --  110*  BUN  --   --   --  20  --   --   --   --  17  CREATININE  --   --   --  1.09*  --   --   --   --  1.05*  CALCIUM  --   --   --  12.7*  --   --   --   --  11.5*  AST  --   --   --  16  --   --   --   --   --   ALT  --   --   --  23  --   --   --   --   --   ALKPHOS  --   --   --  75  --   --   --   --   --   BILITOT  --   --   --  0.9  --   --   --   --   --   ALBUMIN  --   --   --  4.5  --   --   --   --   --   LATICACIDVEN 0.8  --  0.7  --   --  0.8 0.8  --  0.9  INR  --   --   --   --   --   --   --  1.4*  --   AMMONIA  --  19  --   --   --   --   --   --   --   BNP  --   --   --   --  30.6  --   --   --   --     ------------------------------------------------------------------------------------------------------------------ No results for input(s): "CHOL", "HDL", "LDLCALC", "TRIG", "CHOLHDL", "LDLDIRECT" in the last 72  hours.  Lab Results  Component Value Date   HGBA1C 5.6 02/06/2022   ------------------------------------------------------------------------------------------------------------------ No results for input(s): "TSH", "T4TOTAL", "T3FREE", "THYROIDAB" in the last 72 hours.  Invalid input(s): "FREET3"  Cardiac Enzymes No results for input(s): "CKMB", "TROPONINI", "MYOGLOBIN" in the last 168 hours.  Invalid input(s): "CK" ------------------------------------------------------------------------------------------------------------------    Component Value Date/Time   BNP 30.6 06/23/2022 1853    CBG: No results for input(s): "GLUCAP" in the last 168 hours.  Recent Results (from the past 240 hour(s))  SARS Coronavirus 2 by RT PCR (hospital order, performed in [facility] hospital lab) *cepheid single result test* Anterior Nasal Swab     Status: None   Collection Time: 06/23/22  9:34 PM   Specimen: Anterior Nasal Swab  Result Value Ref Range Status   SARS Coronavirus 2 by RT PCR NEGATIVE NEGATIVE Final    Comment: (NOTE) SARS-CoV-2 target nucleic acids are NOT DETECTED.  The SARS-CoV-2 RNA is generally detectable in upper and lower respiratory specimens during the acute phase of infection. The lowest concentration of SARS-CoV-2 viral copies this assay can detect is 250 copies / mL. A negative result does not preclude SARS-CoV-2 infection and should not be used as the sole basis for treatment or other patient management decisions.  A negative result may occur with improper specimen collection / handling, submission of specimen other than nasopharyngeal swab, presence of viral mutation(s) within the areas targeted by this assay, and inadequate number of viral copies (<250 copies / mL). A negative result must be combined with clinical observations, patient history, and epidemiological information.  Fact Sheet for Patients:   RoadLapTop.co.za  Fact Sheet for  Healthcare Providers: http://kim-miller.com/  This test is not yet approved or  cleared by the [place] FDA and has been authorized for detection and/or diagnosis of SARS-CoV-2 by FDA under an Emergency Use Authorization (EUA).  This EUA will remain in effect (meaning this test can be used) for the duration of the COVID-19 declaration under Section 564(b)(1) of the Act, 21 U.S.C. section 360bbb-3(b)(1), unless the authorization is terminated or revoked sooner.  Performed at [facility], [address], [place], [place] [address]      Radiology Studies: CT Head Wo Contrast  Result Date: 06/23/2022 CLINICAL DATA:  Weakness and altered mental EXAM: CT HEAD WITHOUT CONTRAST TECHNIQUE: Contiguous axial images were obtained from the base of the skull through the vertex without intravenous contrast. RADIATION DOSE REDUCTION: This exam was performed according to the departmental dose-optimization program which includes automated exposure control, adjustment of the mA and/or kV according to patient size and/or use of iterative reconstruction technique. COMPARISON:  CT 02/26/2022 FINDINGS: Brain: No acute territorial infarction, hemorrhage, or intracranial mass. The previously noted right intraparenchymal hematoma has resolved. Mild wallerian degeneration of the right peduncle and brainstem. Patchy hypodensity in the bilateral white matter likely chronic small vessel disease. Nonenlarged ventricles. Vascular: No hyperdense vessels.  No unexpected calcification Skull: Normal. Negative for fracture or focal lesion. Sinuses/Orbits: Ethmoid sinus disease. Other: None IMPRESSION: 1. No CT evidence  for acute intracranial abnormality. 2. Patchy hypodensity in the white matter, probable chronic small vessel disease. Electronically Signed   By: Jasmine Pang M.D.   On: 06/23/2022 20:32   DG Chest Port 1 View  Result Date: 06/23/2022 CLINICAL DATA:  Generalized weakness  with altered mental status. EXAM: PORTABLE CHEST 1 VIEW COMPARISON:  03/22/2022 FINDINGS: The heart size and mediastinal contours are within normal limits. Both lungs are clear. The visualized skeletal structures are unremarkable. IMPRESSION: No active disease. Electronically Signed   By: Darliss Cheney M.D.   On: 06/23/2022 17:01     Pamella Pert, MD, PhD [facility]  Between 7 am - 7 pm I am available, please contact me via Amion (for emergencies) or Securechat (non urgent messages)  Between 7 pm - 7 am I am not available, please contact night coverage MD/APP via Amion

## 2022-06-24 NOTE — Assessment & Plan Note (Signed)
-   This is likely metabolic encephalopathy due to her UTI. - We will follow neurochecks every 4 hours for 24 hours. - Her noncontrasted CT scan showed no acute findings

## 2022-06-24 NOTE — Assessment & Plan Note (Signed)
-   We will continue Eliquis. 

## 2022-06-24 NOTE — Assessment & Plan Note (Addendum)
We will continue amlodipine,, Cozaar, Coreg and hydralazine

## 2022-06-24 NOTE — Assessment & Plan Note (Signed)
-   This could be contributing to altered mental status. - She will be hydrated with IV normal saline as mentioned above. - I ordered 1 dose of 20 mg of IV Lasix. - We will monitor calcium levels

## 2022-06-24 NOTE — Assessment & Plan Note (Signed)
-   She will be admitted to an observation medical telemetry bed. - We will continue antibiotic therapy with IV Rocephin. - She will be hydrated with IV normal saline. - We will follow blood and urine cultures.

## 2022-06-24 NOTE — Assessment & Plan Note (Signed)
-   We will continue BuSpar and Zoloft. 

## 2022-06-25 DIAGNOSIS — N390 Urinary tract infection, site not specified: Secondary | ICD-10-CM | POA: Diagnosis not present

## 2022-06-25 DIAGNOSIS — A4150 Gram-negative sepsis, unspecified: Secondary | ICD-10-CM | POA: Diagnosis not present

## 2022-06-25 LAB — URINE CULTURE: Culture: >=100000 — AB

## 2022-06-25 NOTE — Progress Notes (Signed)
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  Daughter is at bedside and provides most of the history.  Shortly upon arrival in the ED she was found to be febrile to 102  Subjective / 24h Interval events: Somewhat anxious this morning but Calmer than yesterday.  Daughter is at bedside  Assesement and Plan: Principal Problem:   Sepsis due to gram-negative UTI [facility]) Active Problems:   Altered mental status   Hypercalcemia   Essential hypertension   History of CVA (cerebrovascular accident)   History of pulmonary embolism   Anxiety and depression   Acute metabolic encephalopathy   Principal problem SIRS-patient febrile to 102 and tachypneic on admission.  Urinalysis could suggest an infection but daughter and patient deny any significant urinary symptoms.  Afebrile last night, urine cultures with multiple species but given resolution of fever with antibiotics favor to continue empirically to treat 3 to 5 days.  She is still off of her baseline, continue IV antibiotics while monitoring blood cultures  Active problems Acute metabolic encephalopathy, restlessness-she is not back to baseline, restless, intermittently confused.  Continue frequent reorientation.  Suspect a degree of hospital-acquired delirium.  CT scan on admission without acute intracranial abnormalities.  History of hemorrhagic CVA-status post prolonged hospitalization, now with residual left-sided weakness and spasticity.  Being followed by rehab MD on a regular basis.  CT scan on admission stable  History of bilateral PE-cleared for  anticoagulation by neurology in April.  Continue Eliquis  Essential hypertension-continue amlodipine, Coreg, losartan, blood pressure suboptimal, monitor, if needed can increase the dose  GAD-continue home medications  Left-sided pain-somewhat chronic, alternate gabapentin, oxycodone, Tylenol  Hypercalcemia-repeat calcium this morning   Scheduled Meds:  amantadine  100 mg Oral BID   amLODipine  10 mg Oral Daily   apixaban  5 mg Oral BID   baclofen  5-15 mg Oral BID   carvedilol  6.25 mg Oral BID WC   gabapentin  100 mg Oral BID   hydrALAZINE  50 mg Oral Q6H   losartan  25 mg Oral Daily   magnesium gluconate  500 mg Oral QHS   multivitamin with minerals  1 tablet Oral Daily   sertraline  25 mg Oral QHS   Continuous Infusions:  sodium chloride 125 mL/hr at 06/24/22 0915   cefTRIAXone (ROCEPHIN)  IV 2 g (06/24/22 2213)   PRN Meds:.acetaminophen **OR** acetaminophen, busPIRone, famotidine, haloperidol lactate, hydrALAZINE, hydrOXYzine, LORazepam, magnesium hydroxide, ondansetron **OR** ondansetron (ZOFRAN) IV, oxyCODONE, traZODone  Diet Orders (From admission, onward)     Start     Ordered   06/23/22 2206  Diet Heart Room service appropriate? Yes; Fluid consistency: Thin  Diet effective now       Question Answer Comment  Room service appropriate? Yes   Fluid consistency: Thin      06/23/22 2206            DVT prophylaxis:  apixaban (ELIQUIS) tablet 5 mg   Lab Results  Component Value Date   PLT 163 06/24/2022      Code Status: Full Code  Family Communication: daughter at bedside   Status is: Inpatient, persistent encephalopathy   Level of care: Telemetry  Consultants:  None   Objective: Vitals:   06/24/22 1835 06/24/22 2031 06/25/22 0019 06/25/22 1023  BP: (!) 173/131 127/86 (!) 149/97 (!) 187/83  Pulse:   82 78  Resp:   18 19  Temp:  98.6 F (37 C)  97.9 F (36.6 C)  TempSrc:  Axillary  Oral  SpO2:  98% 98% 99%  Weight:      Height:         Intake/Output Summary (Last 24 hours) at 06/25/2022 1134 Last data filed at 06/25/2022 1000 Gross per 24 hour  Intake 117 ml  Output 525 ml  Net -408 ml    Wt Readings from Last 3 Encounters:  06/24/22 61.1 kg  04/26/22 66 kg  03/21/22 74.1 kg    Examination:  Constitutional: NAD Eyes: lids and conjunctivae normal, no scleral icterus ENMT: mmm Neck: normal, supple Respiratory: clear to auscultation bilaterally, no wheezing, no crackles. Normal respiratory effort.  Cardiovascular: Regular rate and rhythm, no murmurs / rubs / gallops. No LE edema. Abdomen: soft, no distention, no tenderness. Bowel sounds positive.  Skin: no rashes Neurologic: Left-sided weakness, spasticity   Data Reviewed: I have independently reviewed following labs and imaging studies   CBC Recent Labs  Lab 06/23/22 1852 06/24/22 0443 06/24/22 1413  WBC 6.0 5.8 6.3  HGB 13.7 12.6 11.4*  HCT 41.5 37.9 35.1*  PLT 193 169 163  MCV 86.5 85.9 85.8  MCH 28.5 28.6 27.9  MCHC 33.0 33.2 32.5  RDW 12.5 12.5 12.5  LYMPHSABS 1.8  --   --   MONOABS 0.5  --   --   EOSABS 0.0  --   --   BASOSABS 0.1  --   --      Recent Labs  Lab 06/23/22 1835 06/23/22 1851 06/23/22 1852 06/23/22 1853 06/23/22 2056 06/23/22 2213 06/24/22 0111 06/24/22 0443 06/24/22 1413  NA  --   --  147*  --   --   --   --  144 145  K  --   --  3.8  --   --   --   --  3.4* 3.4*  CL  --   --  113*  --   --   --   --  112* 112*  CO2  --   --  22  --   --   --   --  23 22  GLUCOSE  --   --  104*  --   --   --   --  110* 97  BUN  --   --  20  --   --   --   --  17 15  CREATININE  --   --  1.09*  --   --   --   --  1.05* 1.02*  CALCIUM  --   --  12.7*  --   --   --   --  11.5* 11.4*  AST  --   --  16  --   --   --   --   --  23  ALT  --   --  23  --   --   --   --   --  20  ALKPHOS  --   --  75  --   --   --   --   --  66  BILITOT  --   --  0.9  --   --   --   --   --  0.9  ALBUMIN  --   --  4.5  --   --   --   --   --  3.7   LATICACIDVEN  --  0.7  --   --  0.8 0.8  --  0.9 1.8  INR  --   --   --   --   --   --  1.4*  --   --   AMMONIA 19  --   --   --   --   --   --   --   --   BNP  --   --   --  30.6  --   --   --   --   --      ------------------------------------------------------------------------------------------------------------------ No results for input(s): "CHOL", "HDL", "LDLCALC", "TRIG", "CHOLHDL", "LDLDIRECT" in the last 72 hours.  Lab Results  Component Value Date   HGBA1C 5.6 02/06/2022   ------------------------------------------------------------------------------------------------------------------ No results for input(s): "TSH", "T4TOTAL", "T3FREE", "THYROIDAB" in the last 72 hours.  Invalid input(s): "FREET3"  Cardiac Enzymes No results for input(s): "CKMB", "TROPONINI", "MYOGLOBIN" in the last 168 hours.  Invalid input(s): "CK" ------------------------------------------------------------------------------------------------------------------    Component Value Date/Time   BNP 30.6 06/23/2022 1853    CBG: No results for input(s): "GLUCAP" in the last 168 hours.  Recent Results (from the past 240 hour(s))  Urine Culture     Status: Abnormal   Collection Time: 06/23/22  7:49 PM   Specimen: Urine, Clean Catch  Result Value Ref Range Status   Specimen Description   Final    URINE, CLEAN CATCH Performed at [facility], [address], [place], [place] [address]    Special Requests   Final    NONE Performed at [facility], [address] [place], [place] [address]    Culture (A)  Final    >=100,000 COLONIES/mL MULTIPLE SPECIES PRESENT, SUGGEST RECOLLECTION   Report Status 06/25/2022 FINAL  Final  Culture, blood (routine x 2)     Status: None (Preliminary result)   Collection Time: 06/23/22  8:55 PM   Specimen: BLOOD  Result Value Ref Range Status   Specimen Description   Final    BLOOD RIGHT ANTECUBITAL Performed at [facility], [address] [place], [place] [address]    Special Requests   Final    BOTTLES DRAWN AEROBIC AND ANAEROBIC Blood Culture adequate volume Performed at [facility], [address] [place], [place] [address]    Culture   Final    NO GROWTH 1 DAY Performed at [facility], [address], [place], [place] [address]    Report Status PENDING  Incomplete  Culture, blood (routine x 2)     Status: None (Preliminary result)   Collection Time: 06/23/22  9:20 PM   Specimen: BLOOD  Result Value Ref Range Status   Specimen Description   Final    BLOOD BLOOD RIGHT HAND Performed at [facility], [address] [place], [place] [address]    Special Requests   Final    BOTTLES DRAWN AEROBIC AND ANAEROBIC Blood Culture adequate volume Performed at [facility], [address] [place], [place] [address]    Culture   Final    NO GROWTH 1 DAY Performed at [facility], [address], [place], [place] [address]    Report Status PENDING  Incomplete  SARS Coronavirus 2 by RT PCR (hospital  order, performed in [facility] hospital lab) *cepheid single result test* Anterior Nasal Swab     Status: None   Collection Time: 06/23/22  9:34 PM   Specimen: Anterior Nasal Swab  Result Value Ref Range Status   SARS Coronavirus 2 by RT PCR NEGATIVE NEGATIVE Final    Comment: (NOTE) SARS-CoV-2 target nucleic acids are NOT DETECTED.  The SARS-CoV-2 RNA is generally detectable in upper and lower respiratory specimens during the acute phase of infection. The lowest concentration of SARS-CoV-2 viral copies this assay can detect is 250 copies / mL. A negative result does not preclude SARS-CoV-2 infection and should not be used as the sole basis for treatment or other patient management decisions.  A negative result may occur with improper specimen collection / handling, submission of specimen other than nasopharyngeal  swab, presence of viral mutation(s) within the areas targeted by this assay, and inadequate number of viral copies (<250 copies / mL). A negative result must be combined with clinical observations, patient history, and epidemiological information.  Fact Sheet for Patients:   RoadLapTop.co.za  Fact Sheet for Healthcare Providers: http://kim-miller.com/  This test is not yet approved or  cleared by the [place] FDA and has been authorized for detection and/or diagnosis of SARS-CoV-2 by FDA under an Emergency Use Authorization (EUA).  This EUA will remain in effect (meaning this test can be used) for the duration of the COVID-19 declaration under Section 564(b)(1) of the Act, 21 U.S.C. section 360bbb-3(b)(1), unless the authorization is terminated or revoked sooner.  Performed at [facility], [address], [place], [place] [address]      Radiology Studies: No results found.   Pamella Pert, MD, PhD [facility]  Between 7 am - 7 pm I am available, please contact me via Amion (for emergencies) or Securechat (non urgent messages)  Between 7 pm - 7 am I am not available, please contact night coverage MD/APP via Amion

## 2022-06-25 NOTE — Progress Notes (Signed)
PT Cancellation Note  Patient Details Name: Sarah Myers MRN: [id number] DOB: [birth year]   Cancelled Treatment:     PT order received but eval deferred this date - nursing advises pt lethargic, confused and following minimal cues.  Will follow.   Channell Quattrone 06/25/2022, 3:08 PM

## 2022-06-26 DIAGNOSIS — A4150 Gram-negative sepsis, unspecified: Secondary | ICD-10-CM | POA: Diagnosis not present

## 2022-06-26 DIAGNOSIS — N390 Urinary tract infection, site not specified: Secondary | ICD-10-CM | POA: Diagnosis not present

## 2022-06-26 LAB — COMPREHENSIVE METABOLIC PANEL
ALT: 22 U/L (ref 0–44)
AST: 22 U/L (ref 15–41)
Albumin: 3.6 g/dL (ref 3.5–5.0)
Alkaline Phosphatase: 63 U/L (ref 38–126)
Anion gap: 11 (ref 5–15)
BUN: 7 mg/dL (ref 6–20)
CO2: 19 mmol/L — ABNORMAL LOW (ref 22–32)
Calcium: 10.3 mg/dL (ref 8.9–10.3)
Chloride: 112 mmol/L — ABNORMAL HIGH (ref 98–111)
Creatinine, Ser: 0.71 mg/dL (ref 0.44–1.00)
GFR, Estimated: >60 mL/min (ref >60)
Glucose, Bld: 78 mg/dL (ref 70–99)
Potassium: 3.1 mmol/L — ABNORMAL LOW (ref 3.5–5.1)
Sodium: 142 mmol/L (ref 135–145)
Total Bilirubin: 0.8 mg/dL (ref 0.3–1.2)
Total Protein: 6.8 g/dL (ref 6.5–8.1)

## 2022-06-26 LAB — CBC
HCT: 37.8 % (ref 36.0–46.0)
Hemoglobin: 12.3 g/dL (ref 12.0–15.0)
MCH: 28.1 pg (ref 26.0–34.0)
MCHC: 32.5 g/dL (ref 30.0–36.0)
MCV: 86.5 fL (ref 80.0–100.0)
Platelets: 144 10*3/uL — ABNORMAL LOW (ref 150–400)
RBC: 4.37 MIL/uL (ref 3.87–5.11)
RDW: 12.4 % (ref 11.5–15.5)
WBC: 4.9 10*3/uL (ref 4.0–10.5)
nRBC: 0 % (ref 0.0–0.2)

## 2022-06-26 LAB — MAGNESIUM: Magnesium: 1.7 mg/dL (ref 1.7–2.4)

## 2022-06-26 MED ORDER — PODOFILOX 0.5 % EX SOLN
Freq: Two times a day (BID) | CUTANEOUS | 0 refills | Status: DC
Start: 2022-06-26 — End: 2022-07-05

## 2022-06-26 MED ORDER — CEFDINIR 300 MG PO CAPS: 300.0000 mg | ORAL_CAPSULE | Freq: Two times a day (BID) | ORAL | 0 refills | Status: DC

## 2022-06-26 MED ORDER — POTASSIUM CHLORIDE CRYS ER 20 MEQ PO TBCR
40.0000 meq | EXTENDED_RELEASE_TABLET | ORAL | Status: DC
  Filled 2022-06-26: qty 2

## 2022-06-26 MED ORDER — POTASSIUM CHLORIDE 20 MEQ PO PACK: 40.0000 meq | PACK | ORAL | Status: DC

## 2022-06-26 NOTE — Progress Notes (Signed)
Chaplain tried to engage in an initial visit but at time of arrival, Sarah Myers had discharged.      06/26/22 1300  Clinical Encounter Type  Visited With Patient not available  Visit Type Initial;Spiritual support

## 2022-06-26 NOTE — TOC Transition Note (Signed)
Transition of Care [facility]) - CM/SW Discharge Note  Patient Details  Name: ETHELL BLATCHFORD MRN: [id number] Date of Birth: 06/16/1971  Transition of Care [facility]) CM/SW Contact:  Ewing Schlein, LCSW Phone Number: 06/26/2022, 11:34 AM  Clinical Narrative: Patient is medically ready for discharge. CSW notified by Morrie Sheldon with [facility] that patient is active with the agency for HHPT and OT. HH orders have been placed. Patient will need PTAR home as she is bedbound at baseline. PTAR has been scheduled. Transportation packet completed. TOC signing off.  Final next level of care: Home w Home Health Services Barriers to Discharge: Barriers Resolved  Patient Goals and CMS Choice Patient states their goals for this hospitalization and ongoing recovery are:: Return home CMS Medicare.gov Compare Post Acute Care list provided to:: Patient Choice offered to / list presented to : Patient  Discharge Plan and Services         DME Arranged: N/A DME Agency: NA HH Arranged: PT, OT HH Agency: [facility] Date HH Agency Contacted: 06/26/22 Representative spoke with at [facility] Agency: Morrie Sheldon  Readmission Risk Interventions    03/09/2022    9:54 AM  Readmission Risk Prevention Plan  Transportation Screening Complete  PCP or Specialist Appt within 3-5 Days Complete  HRI or Home Care Consult Not Complete  Social Work Consult for Recovery Care Planning/Counseling Complete  Medication Review [employer]) Complete

## 2022-06-26 NOTE — Discharge Summary (Signed)
Physician Discharge Summary  Sarah Myers BSJ:[id number] DOB: 05-28-1971 DOA: 06/23/2022  PCP: Shayne Alken, MD  Admit date: 06/23/2022 Discharge date: 06/26/2022  Admitted From: home Disposition:  home  Recommendations for Outpatient Follow-up:  Follow up with PCP in 1-2 weeks Follow up with ObGyn in 2-3 weeks  Home Health: none Equipment/Devices: none  Discharge Condition: stable CODE STATUS: Full code Diet Orders (From admission, onward)     Start     Ordered   06/23/22 2206  Diet Heart Room service appropriate? Yes; Fluid consistency: Thin  Diet effective now       Question Answer Comment  Room service appropriate? Yes   Fluid consistency: Thin      06/23/22 2206            HPI: Per admitting MD, Sarah Myers is a 51 y.o. 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  She denies any chest pain or dyspnea or cough.  She had subjective fever and chills.  No rhinorrhea or nasal congestion or sore throat.  No nausea or vomiting or diarrhea or abdominal pain.  Hospital Course / Discharge diagnoses: Principal Problem:   Sepsis due to gram-negative UTI [facility]) Active Problems:   Altered mental status   Hypercalcemia   Essential hypertension   History of CVA (cerebrovascular accident)   History of pulmonary embolism   Anxiety and depression   Acute metabolic encephalopathy   Principal problem SIRS-patient febrile to 102 and tachypneic on admission.  Urinalysis could suggest an infection but daughter and patient deny any significant urinary symptoms however patient unable to folly communicate. She was empirically started on  ceftriaxone with resolution of her fever.  Urine cultures with 10 to the fifth colonies but multiple species.  Given clinical response, transition to equivalent oral agent for 3 additional days to complete a 5-day course for presumed UTI.  Blood cultures have remained negative  Active problems Acute metabolic encephalopathy, restlessness-following her stroke patient has been having a decline, some cognitive issues and increased restlessness and anxiety.  These were exacerbated by hospital-acquired delirium.  CT scan of the brain on admission was without acute intracranial abnormalities. History of hemorrhagic CVA-status post prolonged hospitalization, now with residual left-sided weakness and spasticity.  Being followed by rehab MD on a regular basis.  CT scan on admission stable Concern for genital warts vs sebaceous cysts-small whitish lesions, nontender, attempt short course of podofilox.  Outpatient ObGyn follow-up  history of bilateral PE-cleared for anticoagulation by neurology in April.  Continue Eliquis Essential hypertension-continue home regimen GAD-continue home medications Left-sided pain-somewhat chronic, following her stroke Hypercalcemia-calcium improved with fluids Hypokalemia-repleted prior to discharge, resume home potassium supplementations  Sepsis ruled out   Discharge Instructions   Allergies as of 06/26/2022       Reactions   Lisinopril Cough        Medication List     TAKE these medications    acetaminophen 500 MG tablet Commonly known as: TYLENOL Take 500-1,000 mg by mouth every 6 (six) hours as needed for moderate pain. What changed: Another medication with the same name was removed. Continue taking this medication, and follow the directions you see here.   amantadine 100 MG capsule Commonly known  as: SYMMETREL Take 1 capsule (100 mg total) by mouth 2 (two) times daily.   amLODipine 10 MG tablet Commonly known as: NORVASC Take 1 tablet (10 mg total)  by mouth daily.   apixaban 5 MG Tabs tablet Commonly known as: ELIQUIS Take 1 tablet (5 mg total) by mouth 2 (two) times daily.   baclofen 10 MG tablet Commonly known as: LIORESAL Take 5-15 mg by mouth 2 (two) times daily. Take 1/2 tablet (5 mg) and Take 1.5 tablets (15 mg) at bedtime   busPIRone 5 MG tablet Commonly known as: BUSPAR Take 1 tablet (5 mg total) by mouth 3 (three) times daily as needed.   carvedilol 6.25 MG tablet Commonly known as: COREG Take 1 tablet (6.25 mg total) by mouth 2 (two) times daily with a meal.   cefdinir 300 MG capsule Commonly known as: OMNICEF Take 1 capsule (300 mg total) by mouth 2 (two) times daily for 3 days.   famotidine 20 MG tablet Commonly known as: PEPCID Take 1 tablet (20 mg total) by mouth 2 (two) times daily. What changed:  when to take this reasons to take this   gabapentin 100 MG capsule Commonly known as: NEURONTIN Take 1 capsule (100 mg total) by mouth 2 (two) times daily. What changed:  when to take this reasons to take this   hydrALAZINE 50 MG tablet Commonly known as: APRESOLINE Take 1 tablet (50 mg total) by mouth every 6 (six) hours.   hydrOXYzine 10 MG tablet Commonly known as: ATARAX Take 1 tablet (10 mg total) by mouth 3 (three) times daily as needed. What changed: reasons to take this   losartan 25 MG tablet Commonly known as: COZAAR Take 1 tablet (25 mg total) by mouth daily.   magnesium gluconate 500 MG tablet Commonly known as: MAGONATE Take 1 tablet (500 mg total) by mouth at bedtime.   multivitamin with minerals Tabs tablet Take 1 tablet by mouth daily.   podofilox 0.5 % external solution Commonly known as: CONDYLOX Apply topically 2 (two) times daily for 5 days. To vaginal area   potassium chloride 20 MEQ packet Commonly known as: KLOR-CON Take 20 mEq by mouth daily. What changed:  when to take this reasons to take this   sertraline 50 MG tablet Commonly known as: ZOLOFT Take 1 tablet  (50 mg total) by mouth at bedtime. What changed: how much to take   tiZANidine 4 MG tablet Commonly known as: Zanaflex Take 1 tablet (4 mg total) by mouth at bedtime.       Consultations: none  Procedures/Studies:  CT Head Wo Contrast  Result Date: 06/23/2022 CLINICAL DATA:  Weakness and altered mental EXAM: CT HEAD WITHOUT CONTRAST TECHNIQUE: Contiguous axial images were obtained from the base of the skull through the vertex without intravenous contrast. RADIATION DOSE REDUCTION: This exam was performed according to the departmental dose-optimization program which includes automated exposure control, adjustment of the mA and/or kV according to patient size and/or use of iterative reconstruction technique. COMPARISON:  CT 02/26/2022 FINDINGS: Brain: No acute territorial infarction, hemorrhage, or intracranial mass. The previously noted right intraparenchymal hematoma has resolved. Mild wallerian degeneration of the right peduncle and brainstem. Patchy hypodensity in the bilateral white matter likely chronic small vessel disease. Nonenlarged ventricles. Vascular: No hyperdense vessels.  No unexpected calcification Skull: Normal. Negative for fracture or focal lesion. Sinuses/Orbits: Ethmoid sinus disease. Other: None IMPRESSION: 1. No CT evidence for acute intracranial abnormality. 2. Patchy hypodensity in the white matter, probable chronic  small vessel disease. Electronically Signed   By: Jasmine Pang M.D.   On: 06/23/2022 20:32   DG Chest Port 1 View  Result Date: 06/23/2022 CLINICAL DATA:  Generalized weakness with altered mental status. EXAM: PORTABLE CHEST 1 VIEW COMPARISON:  03/22/2022 FINDINGS: The heart size and mediastinal contours are within normal limits. Both lungs are clear. The visualized skeletal structures are unremarkable. IMPRESSION: No active disease. Electronically Signed   By: Darliss Cheney M.D.   On: 06/23/2022 17:01     Subjective: - no chest pain, shortness of breath,  no abdominal pain, nausea or vomiting.   Discharge Exam: BP (!) 174/88   Pulse 81   Temp 98.6 F (37 C) (Oral)   Resp 20   Ht 5\' 3"  (1.6 m)   Wt 61.1 kg   SpO2 100%   BMI 23.86 kg/m   General: Pt is alert, awake, not in acute distress Cardiovascular: RRR, S1/S2 +, no rubs, no gallops Respiratory: CTA bilaterally, no wheezing, no rhonchi Abdominal: Soft, NT, ND, bowel sounds + Extremities: no edema, no cyanosis   The results of significant diagnostics from this hospitalization (including imaging, microbiology, ancillary and laboratory) are listed below for reference.     Microbiology: Recent Results (from the past 240 hour(s))  Urine Culture     Status: Abnormal   Collection Time: 06/23/22  7:49 PM   Specimen: Urine, Clean Catch  Result Value Ref Range Status   Specimen Description   Final    URINE, CLEAN CATCH Performed at [facility], [address], [place], [place]    Special Requests   Final    NONE Performed at [facility], [address], [place], [place]    Culture (A)  Final    >=100,000 COLONIES/mL MULTIPLE SPECIES PRESENT, SUGGEST RECOLLECTION   Report Status 06/25/2022 FINAL  Final  Culture, blood (routine x 2)     Status: None (Preliminary result)   Collection Time: 06/23/22  8:55 PM   Specimen: BLOOD  Result Value Ref Range Status   Specimen Description   Final    BLOOD RIGHT ANTECUBITAL Performed at [facility], [address] [place], [place]    Special Requests   Final    BOTTLES DRAWN AEROBIC AND ANAEROBIC Blood Culture adequate volume Performed at [facility], [address] [place], [place]    Culture   Final    NO GROWTH 2 DAYS Performed at [facility], [address] [place], [place]    Report Status PENDING  Incomplete  Culture, blood (routine x 2)     Status: None (Preliminary result)   Collection  Time: 06/23/22  9:20 PM   Specimen: BLOOD  Result Value Ref Range Status   Specimen Description   Final    BLOOD BLOOD RIGHT HAND Performed at [facility], [address] [place], [place]    Special Requests   Final    BOTTLES DRAWN AEROBIC AND ANAEROBIC Blood Culture adequate volume Performed at [facility], [address] [place], [place]    Culture   Final    NO GROWTH 2 DAYS Performed at [facility], [address] [place], [place]    Report Status PENDING  Incomplete  SARS Coronavirus 2 by RT PCR (hospital order, performed in [facility] hospital lab) *cepheid single result test* Anterior Nasal Swab  Status: None   Collection Time: 06/23/22  9:34 PM   Specimen: Anterior Nasal Swab  Result Value Ref Range Status   SARS Coronavirus 2 by RT PCR NEGATIVE NEGATIVE Final    Comment: (NOTE) SARS-CoV-2 target nucleic acids are NOT DETECTED.  The SARS-CoV-2 RNA is generally detectable in upper and lower respiratory specimens during the acute phase of infection. The lowest concentration of SARS-CoV-2 viral copies this assay can detect is 250 copies / mL. A negative result does not preclude SARS-CoV-2 infection and should not be used as the sole basis for treatment or other patient management decisions.  A negative result may occur with improper specimen collection / handling, submission of specimen other than nasopharyngeal swab, presence of viral mutation(s) within the areas targeted by this assay, and inadequate number of viral copies (<250 copies / mL). A negative result must be combined with clinical observations, patient history, and epidemiological information.  Fact Sheet for Patients:   RoadLapTop.co.za  Fact Sheet for Healthcare Providers: http://kim-miller.com/  This test is not yet approved or  cleared by the [place] FDA and has been  authorized for detection and/or diagnosis of SARS-CoV-2 by FDA under an Emergency Use Authorization (EUA).  This EUA will remain in effect (meaning this test can be used) for the duration of the COVID-19 declaration under Section 564(b)(1) of the Act, 21 U.S.C. section 360bbb-3(b)(1), unless the authorization is terminated or revoked sooner.  Performed at [facility], [address], [place], [place] [address]      Labs: Basic Metabolic Panel: Recent Labs  Lab 06/23/22 1852 06/24/22 0443 06/24/22 1413 06/26/22 0603  NA 147* 144 145 142  K 3.8 3.4* 3.4* 3.1*  CL 113* 112* 112* 112*  CO2 22 23 22  19*  GLUCOSE 104* 110* 97 78  BUN 20 17 15 7   CREATININE 1.09* 1.05* 1.02* 0.71  CALCIUM 12.7* 11.5* 11.4* 10.3  MG  --   --   --  1.7   Liver Function Tests: Recent Labs  Lab 06/23/22 1852 06/24/22 1413 06/26/22 0603  AST 16 23 22   ALT 23 20 22   ALKPHOS 75 66 63  BILITOT 0.9 0.9 0.8  PROT 8.3* 7.0 6.8  ALBUMIN 4.5 3.7 3.6   CBC: Recent Labs  Lab 06/23/22 1852 06/24/22 0443 06/24/22 1413 06/26/22 0603  WBC 6.0 5.8 6.3 4.9  NEUTROABS 3.7  --   --   --   HGB 13.7 12.6 11.4* 12.3  HCT 41.5 37.9 35.1* 37.8  MCV 86.5 85.9 85.8 86.5  PLT 193 169 163 144*   CBG: No results for input(s): "GLUCAP" in the last 168 hours. Hgb A1c No results for input(s): "HGBA1C" in the last 72 hours. Lipid Profile No results for input(s): "CHOL", "HDL", "LDLCALC", "TRIG", "CHOLHDL", "LDLDIRECT" in the last 72 hours. Thyroid function studies No results for input(s): "TSH", "T4TOTAL", "T3FREE", "THYROIDAB" in the last 72 hours.  Invalid input(s): "FREET3" Urinalysis    Component Value Date/Time   COLORURINE YELLOW 06/23/2022 1949   APPEARANCEUR CLOUDY (A) 06/23/2022 1949   LABSPEC 1.017 06/23/2022 1949   PHURINE 5.0 06/23/2022 1949   GLUCOSEU NEGATIVE 06/23/2022 1949   HGBUR NEGATIVE 06/23/2022 1949   BILIRUBINUR NEGATIVE 06/23/2022 1949   KETONESUR 20 (A)  06/23/2022 1949   PROTEINUR 30 (A) 06/23/2022 1949   UROBILINOGEN 1.0 09/20/2015 1448   NITRITE NEGATIVE 06/23/2022 1949   LEUKOCYTESUR SMALL (A) 06/23/2022 1949    FURTHER DISCHARGE INSTRUCTIONS:   Get Medicines reviewed and  adjusted: Please take all your medications with you for your next visit with your Primary MD   Laboratory/radiological data: Please request your Primary MD to go over all hospital tests and procedure/radiological results at the follow up, please ask your Primary MD to get all Hospital records sent to his/her office.   In some cases, they will be blood work, cultures and biopsy results pending at the time of your discharge. Please request that your primary care M.D. goes through all the records of your hospital data and follows up on these results.   Also Note the following: If you experience worsening of your admission symptoms, develop shortness of breath, life threatening emergency, suicidal or homicidal thoughts you must seek medical attention immediately by calling 911 or calling your MD immediately  if symptoms less severe.   You must read complete instructions/literature along with all the possible adverse reactions/side effects for all the Medicines you take and that have been prescribed to you. Take any new Medicines after you have completely understood and accpet all the possible adverse reactions/side effects.    Do not drive when taking Pain medications or sleeping medications (Benzodaizepines)   Do not take more than prescribed Pain, Sleep and Anxiety Medications. It is not advisable to combine anxiety,sleep and pain medications without talking with your primary care practitioner   Special Instructions: If you have smoked or chewed Tobacco  in the last 2 yrs please stop smoking, stop any regular Alcohol  and or any Recreational drug use.   Wear Seat belts while driving.   Please note: You were cared for by a hospitalist during your hospital stay. Once  you are discharged, your primary care physician will handle any further medical issues. Please note that NO REFILLS for any discharge medications will be authorized once you are discharged, as it is imperative that you return to your primary care physician (or establish a relationship with a primary care physician if you do not have one) for your post hospital discharge needs so that they can reassess your need for medications and monitor your lab values.  Time coordinating discharge: 40 minutes  SIGNED:  Pamella Pert, MD, PhD 06/26/2022, 9:33 AM

## 2022-06-28 ENCOUNTER — Encounter (HOSPITAL_COMMUNITY): Payer: Self-pay

## 2022-06-28 ENCOUNTER — Inpatient Hospital Stay (HOSPITAL_COMMUNITY)
Admission: EM | Admit: 2022-06-28 | Discharge: 2022-07-05 | DRG: 071 | Disposition: A | Discharge status: Skilled Nursing Facility | Payer: Commercial Managed Care - HMO | Attending: Internal Medicine | Admitting: Internal Medicine

## 2022-06-28 ENCOUNTER — Emergency Department (HOSPITAL_COMMUNITY): Payer: Commercial Managed Care - HMO

## 2022-06-28 ENCOUNTER — Inpatient Hospital Stay: Payer: Self-pay | Admitting: Adult Health

## 2022-06-28 ENCOUNTER — Telehealth: Payer: Self-pay

## 2022-06-28 DIAGNOSIS — I619 Nontraumatic intracerebral hemorrhage, unspecified: Secondary | ICD-10-CM | POA: Diagnosis present

## 2022-06-28 DIAGNOSIS — Z9181 History of falling: Secondary | ICD-10-CM

## 2022-06-28 DIAGNOSIS — Z7189 Other specified counseling: Secondary | ICD-10-CM

## 2022-06-28 DIAGNOSIS — I610 Nontraumatic intracerebral hemorrhage in hemisphere, subcortical: Secondary | ICD-10-CM

## 2022-06-28 DIAGNOSIS — G9340 Encephalopathy, unspecified: Secondary | ICD-10-CM | POA: Diagnosis not present

## 2022-06-28 DIAGNOSIS — Z8249 Family history of ischemic heart disease and other diseases of the circulatory system: Secondary | ICD-10-CM

## 2022-06-28 DIAGNOSIS — F411 Generalized anxiety disorder: Secondary | ICD-10-CM | POA: Diagnosis not present

## 2022-06-28 DIAGNOSIS — Z7901 Long term (current) use of anticoagulants: Secondary | ICD-10-CM

## 2022-06-28 DIAGNOSIS — A4150 Gram-negative sepsis, unspecified: Secondary | ICD-10-CM | POA: Diagnosis not present

## 2022-06-28 DIAGNOSIS — Z79899 Other long term (current) drug therapy: Secondary | ICD-10-CM

## 2022-06-28 DIAGNOSIS — N390 Urinary tract infection, site not specified: Secondary | ICD-10-CM | POA: Diagnosis present

## 2022-06-28 DIAGNOSIS — I10 Essential (primary) hypertension: Secondary | ICD-10-CM

## 2022-06-28 DIAGNOSIS — F32A Depression, unspecified: Secondary | ICD-10-CM

## 2022-06-28 DIAGNOSIS — G9341 Metabolic encephalopathy: Secondary | ICD-10-CM | POA: Diagnosis not present

## 2022-06-28 DIAGNOSIS — I69322 Dysarthria following cerebral infarction: Secondary | ICD-10-CM | POA: Diagnosis not present

## 2022-06-28 DIAGNOSIS — Z86711 Personal history of pulmonary embolism: Secondary | ICD-10-CM | POA: Diagnosis not present

## 2022-06-28 DIAGNOSIS — I69354 Hemiplegia and hemiparesis following cerebral infarction affecting left non-dominant side: Secondary | ICD-10-CM

## 2022-06-28 DIAGNOSIS — F419 Anxiety disorder, unspecified: Secondary | ICD-10-CM | POA: Diagnosis present

## 2022-06-28 DIAGNOSIS — R1310 Dysphagia, unspecified: Secondary | ICD-10-CM

## 2022-06-28 DIAGNOSIS — Z515 Encounter for palliative care: Secondary | ICD-10-CM

## 2022-06-28 DIAGNOSIS — E860 Dehydration: Secondary | ICD-10-CM | POA: Diagnosis present

## 2022-06-28 DIAGNOSIS — R7881 Bacteremia: Secondary | ICD-10-CM | POA: Diagnosis not present

## 2022-06-28 DIAGNOSIS — Z888 Allergy status to other drugs, medicaments and biological substances status: Secondary | ICD-10-CM

## 2022-06-28 DIAGNOSIS — E872 Acidosis, unspecified: Secondary | ICD-10-CM | POA: Diagnosis present

## 2022-06-28 DIAGNOSIS — E876 Hypokalemia: Secondary | ICD-10-CM | POA: Diagnosis not present

## 2022-06-28 DIAGNOSIS — Z20822 Contact with and (suspected) exposure to covid-19: Secondary | ICD-10-CM | POA: Diagnosis present

## 2022-06-28 DIAGNOSIS — Z86718 Personal history of other venous thrombosis and embolism: Secondary | ICD-10-CM

## 2022-06-28 DIAGNOSIS — Z9851 Tubal ligation status: Secondary | ICD-10-CM

## 2022-06-28 DIAGNOSIS — I69119 Unspecified symptoms and signs involving cognitive functions following nontraumatic intracerebral hemorrhage: Secondary | ICD-10-CM

## 2022-06-28 LAB — CBC WITH DIFFERENTIAL/PLATELET
Abs Immature Granulocytes: 0.02 10*3/uL (ref 0.00–0.07)
Basophils Absolute: 0.1 10*3/uL (ref 0.0–0.1)
Basophils Relative: 1 %
Eosinophils Absolute: 0.1 10*3/uL (ref 0.0–0.5)
Eosinophils Relative: 1 %
HCT: 39.2 % (ref 36.0–46.0)
Hemoglobin: 13.1 g/dL (ref 12.0–15.0)
Immature Granulocytes: 0 %
Lymphocytes Relative: 26 %
Lymphs Abs: 1.9 10*3/uL (ref 0.7–4.0)
MCH: 28.1 pg (ref 26.0–34.0)
MCHC: 33.4 g/dL (ref 30.0–36.0)
MCV: 83.9 fL (ref 80.0–100.0)
Monocytes Absolute: 0.5 10*3/uL (ref 0.1–1.0)
Monocytes Relative: 7 %
Neutro Abs: 4.7 10*3/uL (ref 1.7–7.7)
Neutrophils Relative %: 65 %
Platelets: 196 10*3/uL (ref 150–400)
RBC: 4.67 MIL/uL (ref 3.87–5.11)
RDW: 12.9 % (ref 11.5–15.5)
WBC: 7.3 10*3/uL (ref 4.0–10.5)
nRBC: 0 % (ref 0.0–0.2)

## 2022-06-28 LAB — COMPREHENSIVE METABOLIC PANEL
ALT: 25 U/L (ref 0–44)
AST: 24 U/L (ref 15–41)
Albumin: 4.1 g/dL (ref 3.5–5.0)
Alkaline Phosphatase: 72 U/L (ref 38–126)
Anion gap: 13 (ref 5–15)
BUN: 9 mg/dL (ref 6–20)
CO2: 14 mmol/L — ABNORMAL LOW (ref 22–32)
Calcium: 11.8 mg/dL — ABNORMAL HIGH (ref 8.9–10.3)
Chloride: 118 mmol/L — ABNORMAL HIGH (ref 98–111)
Creatinine, Ser: 0.82 mg/dL (ref 0.44–1.00)
GFR, Estimated: >60 mL/min (ref >60)
Glucose, Bld: 104 mg/dL — ABNORMAL HIGH (ref 70–99)
Potassium: 3.6 mmol/L (ref 3.5–5.1)
Sodium: 145 mmol/L (ref 135–145)
Total Bilirubin: 0.9 mg/dL (ref 0.3–1.2)
Total Protein: 7.7 g/dL (ref 6.5–8.1)

## 2022-06-28 LAB — URINALYSIS, ROUTINE W REFLEX MICROSCOPIC
Bilirubin Urine: NEGATIVE
Glucose, UA: NEGATIVE mg/dL
Hgb urine dipstick: NEGATIVE
Ketones, ur: 80 mg/dL — AB
Leukocytes,Ua: NEGATIVE
Nitrite: NEGATIVE
Protein, ur: NEGATIVE mg/dL
Specific Gravity, Urine: 1.01 (ref 1.005–1.030)
pH: 5 (ref 5.0–8.0)

## 2022-06-28 LAB — LACTIC ACID, PLASMA
Lactic Acid, Venous: 0.7 mmol/L (ref 0.5–1.9)
Lactic Acid, Venous: 0.7 mmol/L (ref 0.5–1.9)

## 2022-06-28 LAB — I-STAT BETA HCG BLOOD, ED (MC, WL, AP ONLY): I-stat hCG, quantitative: <5 m[IU]/mL (ref <5)

## 2022-06-28 MED ORDER — ACETAMINOPHEN 650 MG RE SUPP
650.0000 mg | Freq: Four times a day (QID) | RECTAL | Status: DC | PRN
  Administered 2022-07-02: 650 mg via RECTAL
  Filled 2022-06-28: qty 1

## 2022-06-28 MED ORDER — ONDANSETRON HCL 4 MG/2ML IJ SOLN
4.0000 mg | Freq: Four times a day (QID) | INTRAMUSCULAR | Status: DC | PRN
  Administered 2022-06-30 – 2022-07-02 (×3): 4 mg via INTRAVENOUS
  Filled 2022-06-28 (×3): qty 2

## 2022-06-28 MED ORDER — SERTRALINE HCL 25 MG PO TABS
25.0000 mg | ORAL_TABLET | Freq: Every day | ORAL | Status: DC
  Administered 2022-06-28: 25 mg via ORAL
  Filled 2022-06-28 (×2): qty 1

## 2022-06-28 MED ORDER — AMLODIPINE BESYLATE 10 MG PO TABS
10.0000 mg | ORAL_TABLET | Freq: Every day | ORAL | Status: DC
  Administered 2022-07-04 – 2022-07-05 (×2): 10 mg via ORAL
  Filled 2022-06-28 (×5): qty 1

## 2022-06-28 MED ORDER — LABETALOL HCL 5 MG/ML IV SOLN
10.0000 mg | INTRAVENOUS | Status: DC | PRN
  Administered 2022-06-29 – 2022-07-01 (×2): 10 mg via INTRAVENOUS
  Filled 2022-06-28 (×2): qty 4

## 2022-06-28 MED ORDER — SODIUM CHLORIDE 0.9 % IV BOLUS
1000.0000 mL | Freq: Once | INTRAVENOUS | Status: AC
  Administered 2022-06-28: 1000 mL via INTRAVENOUS

## 2022-06-28 MED ORDER — HYDROXYZINE HCL 10 MG PO TABS
10.0000 mg | ORAL_TABLET | Freq: Three times a day (TID) | ORAL | Status: DC | PRN
  Administered 2022-06-29: 10 mg via ORAL
  Filled 2022-06-28: qty 1

## 2022-06-28 MED ORDER — ONDANSETRON HCL 4 MG PO TABS: 4.0000 mg | ORAL_TABLET | Freq: Four times a day (QID) | ORAL | Status: DC | PRN

## 2022-06-28 MED ORDER — GABAPENTIN 100 MG PO CAPS
100.0000 mg | ORAL_CAPSULE | Freq: Two times a day (BID) | ORAL | Status: DC
  Administered 2022-06-28 – 2022-07-05 (×5): 100 mg via ORAL
  Filled 2022-06-28 (×11): qty 1

## 2022-06-28 MED ORDER — BACLOFEN 10 MG PO TABS
5.0000 mg | ORAL_TABLET | Freq: Two times a day (BID) | ORAL | Status: DC
  Administered 2022-06-28: 10 mg via ORAL
  Administered 2022-07-03: 15 mg via ORAL
  Administered 2022-07-04 – 2022-07-05 (×3): 10 mg via ORAL
  Filled 2022-06-28 (×2): qty 1
  Filled 2022-06-28: qty 2
  Filled 2022-06-28 (×4): qty 1
  Filled 2022-06-28: qty 2
  Filled 2022-06-28: qty 1
  Filled 2022-06-28 (×2): qty 2

## 2022-06-28 MED ORDER — PIPERACILLIN-TAZOBACTAM 3.375 G IVPB 30 MIN
3.3750 g | Freq: Once | INTRAVENOUS | Status: AC
Start: 2022-06-28 — End: 2022-06-28
  Administered 2022-06-28: 3.375 g via INTRAVENOUS
  Filled 2022-06-28: qty 50

## 2022-06-28 MED ORDER — PODOFILOX 0.5 % EX SOLN: Freq: Two times a day (BID) | CUTANEOUS | Status: DC

## 2022-06-28 MED ORDER — LOSARTAN POTASSIUM 50 MG PO TABS
25.0000 mg | ORAL_TABLET | Freq: Every day | ORAL | Status: DC
  Administered 2022-07-04 – 2022-07-05 (×2): 25 mg via ORAL
  Filled 2022-06-28 (×5): qty 1

## 2022-06-28 MED ORDER — AMANTADINE HCL 100 MG PO CAPS
100.0000 mg | ORAL_CAPSULE | Freq: Two times a day (BID) | ORAL | Status: DC
  Administered 2022-06-28 – 2022-07-05 (×5): 100 mg via ORAL
  Filled 2022-06-28 (×13): qty 1

## 2022-06-28 MED ORDER — FAMOTIDINE 20 MG PO TABS
20.0000 mg | ORAL_TABLET | Freq: Two times a day (BID) | ORAL | Status: DC | PRN
  Administered 2022-07-01: 20 mg via ORAL
  Filled 2022-06-28: qty 1

## 2022-06-28 MED ORDER — HYDRALAZINE HCL 25 MG PO TABS
50.0000 mg | ORAL_TABLET | Freq: Four times a day (QID) | ORAL | Status: DC
Start: 2022-06-29 — End: 2022-06-28

## 2022-06-28 MED ORDER — CARVEDILOL 6.25 MG PO TABS
6.2500 mg | ORAL_TABLET | Freq: Two times a day (BID) | ORAL | Status: DC
  Administered 2022-06-28 – 2022-07-05 (×5): 6.25 mg via ORAL
  Filled 2022-06-28 (×5): qty 1
  Filled 2022-06-28: qty 2
  Filled 2022-06-28 (×3): qty 1

## 2022-06-28 MED ORDER — LORAZEPAM 2 MG/ML IJ SOLN
1.0000 mg | Freq: Once | INTRAMUSCULAR | Status: AC
  Administered 2022-06-28: 1 mg via INTRAVENOUS
  Filled 2022-06-28: qty 1

## 2022-06-28 MED ORDER — ACETAMINOPHEN 325 MG PO TABS
650.0000 mg | ORAL_TABLET | Freq: Four times a day (QID) | ORAL | Status: DC | PRN
  Filled 2022-06-28: qty 2

## 2022-06-28 MED ORDER — SENNOSIDES-DOCUSATE SODIUM 8.6-50 MG PO TABS: 1.0000 | ORAL_TABLET | Freq: Every evening | ORAL | Status: DC | PRN

## 2022-06-28 MED ORDER — PIPERACILLIN-TAZOBACTAM 3.375 G IVPB
3.3750 g | Freq: Three times a day (TID) | INTRAVENOUS | Status: DC
  Administered 2022-06-28 – 2022-06-30 (×6): 3.375 g via INTRAVENOUS
  Filled 2022-06-28 (×6): qty 50

## 2022-06-28 MED ORDER — HYDRALAZINE HCL 25 MG PO TABS
50.0000 mg | ORAL_TABLET | Freq: Four times a day (QID) | ORAL | Status: DC
  Administered 2022-06-28 – 2022-06-29 (×2): 50 mg via ORAL
  Filled 2022-06-28 (×2): qty 2

## 2022-06-28 MED ORDER — APIXABAN 5 MG PO TABS
5.0000 mg | ORAL_TABLET | Freq: Two times a day (BID) | ORAL | Status: DC
  Administered 2022-06-28 – 2022-06-29 (×2): 5 mg via ORAL
  Filled 2022-06-28 (×5): qty 1

## 2022-06-28 NOTE — Telephone Encounter (Signed)
Sarah Myers, PT called to request a referral for Palliative Care, a Home Health Aide for skilled nursing due to medication changes 1 week 1 and 2 week 4, a medical social worker for community resources and PT continuation 1 week 7. Please advise

## 2022-06-28 NOTE — ED Notes (Signed)
Unable to obtain blood d/t limited vascular access. Awaiting IV team consult. 3 RN's all assessed w/o success.

## 2022-06-28 NOTE — ED Triage Notes (Addendum)
Pt arrived via EMS, from home, AMS x2 hr. Pt hx of stroke in feb 2023, left sided deficit from such, no change in decitit today, also recently admitted for sepsis. Pt baseline Ao4, pt currently only responding to painful stimuli.   Per EMS pt was given tylenol a few hours ago.

## 2022-06-28 NOTE — ED Notes (Signed)
Unable to obtain urine via I&O d/t patient agitation, provider notified

## 2022-06-28 NOTE — ED Provider Notes (Signed)
[place] DEPT Provider Note   CSN: [id number] Arrival date & time: 06/28/22  1407     History  Chief Complaint  Patient presents with   Altered Mental Status    Sarah Myers is a 51 y.o. female hx of HTN, previous stroke with L sided weakness, here with AMS. Patient was just recently admitted for UTI. Patient was just discharged 2 days ago.  She has been living at home and has been persistently altered.  No fevers at home.  Patient has not been eating and drinking very much or taking her meds.  Patient is here with her daughter was concerned for her mental status.  Also, I was notified that patient has positive blood culture that shows gram-positive rods in 1 of 4 bottles  The history is provided by a relative.       Home Medications Prior to Admission medications   Medication Sig Start Date End Date Taking? Authorizing Provider  acetaminophen (TYLENOL) 500 MG tablet Take 500-1,000 mg by mouth every 6 (six) hours as needed for moderate pain.    [provider]  amantadine (SYMMETREL) 100 MG capsule Take 1 capsule (100 mg total) by mouth 2 (two) times daily. 06/22/22   Raulkar, Clide Deutscher, MD  amLODipine (NORVASC) 10 MG tablet Take 1 tablet (10 mg total) by mouth daily. 06/22/22   Raulkar, Clide Deutscher, MD  apixaban (ELIQUIS) 5 MG TABS tablet Take 1 tablet (5 mg total) by mouth 2 (two) times daily. 06/22/22   Raulkar, Clide Deutscher, MD  baclofen (LIORESAL) 10 MG tablet Take 5-15 mg by mouth 2 (two) times daily. Take 1/2 tablet (5 mg) and Take 1.5 tablets (15 mg) at bedtime 06/13/22   [provider]  busPIRone (BUSPAR) 5 MG tablet Take 1 tablet (5 mg total) by mouth 3 (three) times daily as needed. 06/23/22   Raulkar, Clide Deutscher, MD  carvedilol (COREG) 6.25 MG tablet Take 1 tablet (6.25 mg total) by mouth 2 (two) times daily with a meal. 06/22/22   Raulkar, Clide Deutscher, MD  cefdinir (OMNICEF) 300 MG capsule Take 1 capsule (300 mg total) by mouth 2  (two) times daily for 3 days. 06/26/22 06/29/22  Caren Griffins, MD  famotidine (PEPCID) 20 MG tablet Take 1 tablet (20 mg total) by mouth 2 (two) times daily. Patient taking differently: Take 20 mg by mouth 2 (two) times daily as needed for heartburn or indigestion. 05/22/22   Raulkar, Clide Deutscher, MD  gabapentin (NEURONTIN) 100 MG capsule Take 1 capsule (100 mg total) by mouth 2 (two) times daily. Patient taking differently: Take 100 mg by mouth 2 (two) times daily as needed (nerve pain). 05/24/22   Raulkar, Clide Deutscher, MD  hydrALAZINE (APRESOLINE) 50 MG tablet Take 1 tablet (50 mg total) by mouth every 6 (six) hours. 06/22/22   Raulkar, Clide Deutscher, MD  hydrOXYzine (ATARAX) 10 MG tablet Take 1 tablet (10 mg total) by mouth 3 (three) times daily as needed. Patient taking differently: Take 10 mg by mouth 3 (three) times daily as needed for anxiety. 06/09/22   Raulkar, Clide Deutscher, MD  losartan (COZAAR) 25 MG tablet Take 1 tablet (25 mg total) by mouth daily. 06/09/22   Raulkar, Clide Deutscher, MD  magnesium gluconate (MAGONATE) 500 MG tablet Take 1 tablet (500 mg total) by mouth at bedtime. 05/22/22   Raulkar, Clide Deutscher, MD  Multiple Vitamin (MULTIVITAMIN WITH MINERALS) TABS tablet Take 1 tablet by mouth daily. 05/22/22   Raulkar,  Drema Pry, MD  podofilox (CONDYLOX) 0.5 % external solution Apply topically 2 (two) times daily for 5 days. To vaginal area 06/26/22 07/01/22  Leatha Gilding, MD  potassium chloride (KLOR-CON) 20 MEQ packet Take 20 mEq by mouth daily. Patient taking differently: Take 20 mEq by mouth daily as needed (low potassium). 05/22/22   Raulkar, Drema Pry, MD  sertraline (ZOLOFT) 50 MG tablet Take 1 tablet (50 mg total) by mouth at bedtime. Patient taking differently: Take 25 mg by mouth at bedtime. 06/22/22   Raulkar, Drema Pry, MD  tiZANidine (ZANAFLEX) 4 MG tablet Take 1 tablet (4 mg total) by mouth at bedtime. Patient not taking: Reported on 06/23/2022 05/31/22   Raulkar, Drema Pry, MD      Allergies     Lisinopril    Review of Systems   Review of Systems  Psychiatric/Behavioral:  Positive for confusion.   All other systems reviewed and are negative.   Physical Exam Updated Vital Signs BP (!) 159/89   Pulse 82   Temp 97.9 F (36.6 C) (Rectal)   Resp 15   SpO2 100%  Physical Exam Vitals and nursing note reviewed.  Constitutional:      Comments: Altered, confused   HENT:     Head: Normocephalic.     Nose: Nose normal.     Mouth/Throat:     Mouth: Mucous membranes are dry.  Eyes:     Extraocular Movements: Extraocular movements intact.     Pupils: Pupils are equal, round, and reactive to light.  Cardiovascular:     Rate and Rhythm: Regular rhythm. Tachycardia present.  Pulmonary:     Effort: Pulmonary effort is normal.     Breath sounds: Normal breath sounds.  Abdominal:     General: Abdomen is flat.     Palpations: Abdomen is soft.  Musculoskeletal:        General: Normal range of motion.     Cervical back: Normal range of motion and neck supple.  Skin:    General: Skin is warm.     Capillary Refill: Capillary refill takes less than 2 seconds.  Neurological:     Comments: Confused, unable to answer questions. Contracted on L side (chronic)  Psychiatric:     Comments: Unable     ED Results / Procedures / Treatments   Labs (all labs ordered are listed, but only abnormal results are displayed) Labs Reviewed  CULTURE, BLOOD (ROUTINE X 2)  CULTURE, BLOOD (ROUTINE X 2)  URINE CULTURE  LACTIC ACID, PLASMA  LACTIC ACID, PLASMA  COMPREHENSIVE METABOLIC PANEL  CBC WITH DIFFERENTIAL/PLATELET  PROTIME-INR  APTT  URINALYSIS, ROUTINE W REFLEX MICROSCOPIC  I-STAT BETA HCG BLOOD, ED ([facility], [facility], [facility] ONLY)    EKG None  Radiology DG Chest Port 1 View  Result Date: 06/28/2022 CLINICAL DATA:  Questionable sepsis. EXAM: PORTABLE CHEST 1 VIEW COMPARISON:  Chest x-ray 06/23/2022.  CT chest 02/21/2022 FINDINGS: Heart size is within normal limits. There is prominence of the  right paratracheal region corresponding to ectatic esophagus on recent CT. This is unchanged. Both lungs are clear. The visualized skeletal structures are unremarkable. IMPRESSION: No acute cardiopulmonary process. Electronically Signed   By: Darliss Cheney M.D.   On: 06/28/2022 15:17   CT Head Wo Contrast  Result Date: 06/28/2022 CLINICAL DATA:  Altered mental status. EXAM: CT HEAD WITHOUT CONTRAST TECHNIQUE: Contiguous axial images were obtained from the base of the skull through the vertex without intravenous contrast. RADIATION DOSE REDUCTION: This exam was  performed according to the departmental dose-optimization program which includes automated exposure control, adjustment of the mA and/or kV according to patient size and/or use of iterative reconstruction technique. COMPARISON:  Head CT dated 06/23/2022. FINDINGS: Brain: The ventricles and sulci are appropriate size for patient's age. Periventricular and deep white matter chronic microvascular ischemic changes, advanced for the patient's age and similar to prior CT. There is no acute intracranial hemorrhage. No mass effect or midline shift. No extra-axial fluid collection. Vascular: No hyperdense vessel or unexpected calcification. Skull: Normal. Negative for fracture or focal lesion. Sinuses/Orbits: Chronic appearing opacification of several ethmoid air cells with remodeling. No air-fluid level. The mastoid air cells are clear. Other: None IMPRESSION: 1. No acute intracranial pathology. 2. Chronic microvascular ischemic changes similar to prior CT. Electronically Signed   By: Elgie Collard M.D.   On: 06/28/2022 14:58    Procedures Procedures    Medications Ordered in ED Medications  sodium chloride 0.9 % bolus 1,000 mL (has no administration in time range)    ED Course/ Medical Decision Making/ A&P                           Medical Decision Making JALENE DEMO is a 51 y.o. female here presenting with persistent altered mental  status.  Patient was recently admitted for UTI.  Patient was discharged back home and is persistently altered.  We will repeat sepsis work-up with CBC and CMP and lactate and cultures and UA and chest x-ray and CT head.  7:35 PM I reviewed patient's work-up in the ED. patient's white blood cell count is normal and lactate is normal.  CT head and chest x-ray unremarkable.  In-N-Out cath was performed and urinalysis is still pending.  I received a call from pharmacy while she is in the ED.  Patient Apparently has a positive blood culture that is gram-positive rods in 1 of 4 cultures.  While it may be a skin contaminant given that she was recently admitted for sepsis, pharmacy recommended Zosyn.  I repeated blood cultures as well.  This point hospitalist to readmit for possible bacteremia.  Problems Addressed: Encephalopathy: acute illness or injury Positive blood culture: acute illness or injury  Amount and/or Complexity of Data Reviewed Labs: ordered. Decision-making details documented in ED Course. Radiology: ordered and independent interpretation performed. Decision-making details documented in ED Course. ECG/medicine tests: ordered and independent interpretation performed. Decision-making details documented in ED Course.  Risk Prescription drug management. Decision regarding hospitalization.    Final Clinical Impression(s) / ED Diagnoses Final diagnoses:  None    Rx / DC Orders ED Discharge Orders     None         Charlynne Pander, MD 06/28/22 [phone]

## 2022-06-28 NOTE — Progress Notes (Signed)
Pharmacy Antibiotic Note  Sarah Myers is a 51 y.o. female admitted on 06/28/2022 with hx of HTN, previous stroke with L sided weakness, here with AMS. Patient was just recently admitted for UTI. Patient was just discharged 2 days ago.  She has been living at home and has been persistently altered.  No fevers at home.  Patient has not been eating and drinking very much or taking her meds.  Patient is here with her daughter was concerned for her mental status.  Also, patient has positive blood culture that shows gram-positive rods in 1 of 4 bottles.  Pharmacy has been consulted for Zosyn dosing.  Plan: Zosyn 3.375g IV infused over 4 hours q8h Monitor clinical progress & renal function F/U C&S, abx deescalation / LOT      Temp (24hrs), Avg:98.4 F (36.9 C), Min:97.9 F (36.6 C), Max:98.8 F (37.1 C)  Recent Labs  Lab 06/23/22 1852 06/23/22 2056 06/23/22 2213 06/24/22 0443 06/24/22 1413 06/26/22 0603 06/28/22 1615 06/28/22 1827  WBC 6.0  --   --  5.8 6.3 4.9 7.3  --   CREATININE 1.09*  --   --  1.05* 1.02* 0.71 0.82  --   LATICACIDVEN  --    < > 0.8 0.9 1.8  --  0.7 0.7   < > = values in this interval not displayed.    Estimated Creatinine Clearance: 68.7 mL/min (by C-G formula based on SCr of 0.82 mg/dL).    Allergies  Allergen Reactions   Lisinopril Cough    Antimicrobials this admission: 7/12 Zosyn >>   Microbiology results: 7/12: Bcx: sent 7/07 Bcx: 1 out 4 anaerobic bottle Gram positive rods 7/12 UCx: sent   Thank you for allowing pharmacy to be a part of this patient's care.  Lynden Ang, PharmD, BCPS 06/28/2022 8:14 PM

## 2022-06-28 NOTE — Progress Notes (Signed)
A consult was received from an ED physician for Zosyn per pharmacy dosing.  The patient's profile has been reviewed for ht/wt/allergies/indication/available labs.    A one time order has been placed for Zosyn 3.375g IV x 1 over 30 minutes.    Further antibiotics/pharmacy consults should be ordered by admitting physician if indicated.                       Thank you, Lynden Ang, PharmD, BCPS 06/28/2022  5:19 PM

## 2022-06-28 NOTE — ED Provider Triage Note (Signed)
Emergency Medicine Provider Triage Evaluation Note  Sarah Myers , a 51 y.o. female  was evaluated in triage.  Pt complains of altered mental status.  Patient presents via EMS after approximately 1 hour of altered mental status.  Patient was admitted and discharged from the hospital on July 10 due to sepsis.  Patient has been in a nursing facility and staff there noted that she was altered approximate hour before arrival.  Patient is normally alert and oriented and patient was not responsive at the time.  Upon initial arrival to the emergency department patient was responsive only to painful stimuli.  Patient is now responsive to to verbal stimuli.  Patient was treated with Tylenol just prior to calling EMS.  Afebrile upon arrival but patient's skin does feel warm.  Patient also had a stroke earlier this year with residual left-sided weakness   Review of Systems  Positive: Altered mental status Negative:   Physical Exam  BP (!) 165/89   Pulse 87   Temp 97.9 F (36.6 C) (Rectal)   Resp 14   SpO2 98%  Gen:   Awake, no distress   Resp:  Normal effort  MSK:   Moves extremities without difficulty  Other:  Alert to person, not answering questions  Medical Decision Making  Medically screening exam initiated at 2:30 PM.  Appropriate orders placed.  [name] was informed that the remainder of the evaluation will be completed by another provider, this initial triage assessment does not replace that evaluation, and the importance of remaining in the ED until their evaluation is complete.     Darrick Grinder, PA-C 06/28/22 1435

## 2022-06-28 NOTE — Progress Notes (Signed)
Notified by micro lab at [facility] regarding 06/23/22 positive blood culture. 1 out 4 anaerobic bottle Gram positive rods. Discussed with Dr. Silverio Lay in ED. (Previous received Rocephin 7/7-7/9 x 3 doses and discharged with 3d Cefdinir). He will prescribe Zosyn to cover anaerobes and f/u further cultures.  Radwan Cowley S. Merilynn Finland, PharmD, BCPS Clinical Staff Pharmacist [email or web address]

## 2022-06-28 NOTE — H&P (Signed)
History and Physical    Sarah Myers XBD:[id number] DOB: 1971-09-06 DOA: 06/28/2022  PCP: Shayne Alken, MD   Patient coming from: Home   Chief Complaint: Not eating or drinking, increased anxiety and restlessness   HPI: Sarah Myers is a pleasant 51 y.o. 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  Patient complains of pain all over. She is cared for at home by her daughter who has noted worsening cognitive deficits since returning home from inpatient rehab in April, seem to worsen significantly 2 weeks ago and was hospitalized from 06/23/2022 until 06/26/2022.  She was febrile when she presented on 06/23/2022, was started on Rocephin for suspected UTI, completed 3 days of Rocephin and then discharged with 3 days of Omnicef though she was not having urinary symptoms and urine culture grew multiple species.  Since returning home, the patient has continued to have increased anxiety and restlessness, and is not eating or drinking consistently despite her daughter's encouragement.  1 blood culture bottle from 06/23/2022 is now growing gram-positive bacilli.   ED Course: Upon arrival to the ED, patient is found to be afebrile and saturating well on room air with mild tachycardia and elevated blood pressure.  Head CT is negative for acute findings and chest x-ray negative for acute cardiopulmonary disease.  Basic blood work notable for bicarbonate of 14, normal WBC, and normal lactic acid x2.  Blood cultures were repeated and the patient was given a liter of saline, 1 mg Ativan, and started on Zosyn after discussion with pharmacy.  Review of Systems:  All other systems reviewed and apart from HPI, are  negative.  Past Medical History:  Diagnosis Date   Hypertension     Past Surgical History:  Procedure Laterality Date   BREAST LUMPECTOMY  1992   CESAREAN SECTION     IR GASTROSTOMY TUBE MOD SED  03/06/2022   ORIF RADIAL FRACTURE  02/10/2012   Procedure: OPEN REDUCTION INTERNAL FIXATION (ORIF) RADIAL FRACTURE;  Surgeon: Cammy Copa, MD;  [facility] OR;  Service: Orthopedics;  Laterality: Right;   ORIF ULNAR FRACTURE  02/10/2012   Procedure: OPEN REDUCTION INTERNAL FIXATION (ORIF) ULNAR FRACTURE;  Surgeon: Cammy Copa, MD;  Location: [facility] OR;  Service: Orthopedics;  Laterality: Right;   TUBAL LIGATION      Social History:   reports that she has never smoked. She has never used smokeless tobacco. She reports that she does not currently use alcohol. She reports that she does not use drugs.  Allergies  Allergen Reactions   Lisinopril Cough    Family History  Problem Relation Age of Onset   Diabetes Mother    Hypertension Mother    Heart disease Mother    Diabetes Maternal Aunt    Stroke Maternal Uncle    Diabetes Maternal Grandmother    Heart disease Maternal Grandmother    Diabetes Maternal Grandfather    Heart disease Maternal Grandfather    Heart disease Paternal Grandmother    Heart disease Paternal Grandfather      Prior to Admission medications   Medication Sig Start Date End Date Taking? Authorizing Provider  acetaminophen (TYLENOL) 500 MG tablet Take 500-1,000 mg by mouth every 6 (six) hours as needed for moderate pain.   Yes [provider]  amantadine (SYMMETREL) 100 MG capsule Take 1 capsule (100 mg total) by mouth 2 (two) times daily. 06/22/22  Yes Raulkar, Drema Pry, MD  amLODipine (NORVASC) 10 MG tablet Take 1 tablet (10 mg total) by mouth daily. 06/22/22  Yes Raulkar, Drema Pry, MD  apixaban (ELIQUIS) 5 MG TABS tablet Take 1 tablet (5 mg total) by mouth 2 (two) times daily. 06/22/22  Yes Raulkar, Drema Pry, MD  baclofen (LIORESAL) 10 MG tablet  Take 5-15 mg by mouth 2 (two) times daily. Take 1/2 tablet (5 mg) and Take 1.5 tablets (15 mg) at bedtime 06/13/22  Yes [provider]  carvedilol (COREG) 6.25 MG tablet Take 1 tablet (6.25 mg total) by mouth 2 (two) times daily with a meal. 06/22/22  Yes Raulkar, Drema Pry, MD  cefdinir (OMNICEF) 300 MG capsule Take 1 capsule (300 mg total) by mouth 2 (two) times daily for 3 days. 06/26/22 06/29/22 Yes Gherghe, Daylene Katayama, MD  famotidine (PEPCID) 20 MG tablet Take 1 tablet (20 mg total) by mouth 2 (two) times daily. Patient taking differently: Take 20 mg by mouth 2 (two) times daily as needed for heartburn or indigestion. 05/22/22  Yes Raulkar, Drema Pry, MD  gabapentin (NEURONTIN) 100 MG capsule Take 1 capsule (100 mg total) by mouth 2 (two) times daily. Patient taking differently: Take 100 mg by mouth 2 (two) times daily as needed (nerve pain). 05/24/22  Yes Raulkar, Drema Pry, MD  hydrALAZINE (APRESOLINE) 50 MG tablet Take 1 tablet (50 mg total) by mouth every 6 (six) hours. 06/22/22  Yes Raulkar, Drema Pry, MD  hydrOXYzine (ATARAX) 10 MG tablet Take 1 tablet (10 mg total) by mouth 3 (three) times daily as needed. Patient taking differently: Take 10 mg by mouth 3 (three) times daily as needed for anxiety. 06/09/22  Yes Raulkar, Drema Pry, MD  losartan (COZAAR) 25 MG tablet Take 1 tablet (25 mg total) by mouth daily. 06/09/22  Yes Raulkar, Drema Pry, MD  magnesium gluconate (MAGONATE) 500 MG tablet Take 1 tablet (500 mg total) by mouth at bedtime. 05/22/22  Yes Raulkar, Drema Pry, MD  Multiple Vitamin (MULTIVITAMIN WITH MINERALS) TABS tablet Take 1 tablet by mouth daily. 05/22/22  Yes Raulkar, Drema Pry, MD  podofilox (CONDYLOX) 0.5 % external solution Apply topically 2 (two) times daily for 5 days. To vaginal area 06/26/22 07/01/22 Yes Gherghe, Daylene Katayama, MD  potassium chloride (KLOR-CON) 20 MEQ packet Take 20 mEq by mouth daily. Patient taking differently: Take 20 mEq by mouth daily as needed (low  potassium). 05/22/22  Yes Raulkar, Drema Pry, MD  sertraline (ZOLOFT) 50 MG tablet Take 1 tablet (50 mg total) by mouth at bedtime. Patient taking differently: Take 25 mg by mouth at bedtime. 06/22/22  Yes Raulkar, Drema Pry, MD  busPIRone (BUSPAR) 5 MG tablet Take 1 tablet (5 mg total) by mouth 3 (three) times daily as needed. 06/23/22   Raulkar, Drema Pry, MD  tiZANidine (ZANAFLEX) 4 MG tablet Take 1 tablet (4 mg total) by mouth at bedtime. Patient not taking: Reported on 06/23/2022 05/31/22   Horton Chin, MD    Physical Exam: Vitals:   06/28/22 1830 06/28/22 1900 06/28/22 1930 06/28/22 2000  BP: (!) 155/107 (!) 177/107 (!) 170/120 (!) 140/100  Pulse: 85 (!) 111 (!) 109 98  Resp: 14 20 (!) 22 17  Temp:   98.8 F (37.1 C)   TempSrc:   Rectal   SpO2: 100% 100% 100% 100%    Constitutional: NAD, restless  Eyes: PERTLA, lids and conjunctivae normal ENMT: Mucous membranes are moist. Posterior pharynx clear of any exudate or lesions.   Neck: supple, no masses  Respiratory: no wheezing, no crackles. No accessory muscle use.  Cardiovascular: Rate ~120 and regular. No extremity edema.   Abdomen: No distension, no tenderness, soft. Bowel sounds active.  Musculoskeletal: no clubbing / cyanosis. No joint deformity upper and lower extremities.   Skin: no significant rashes, lesions, ulcers. Warm, dry, well-perfused. Neurologic: Dysarthria, expressive aphasia. Left-sided weakness. Alert and oriented to person and place.  Psychiatric: Anxious, restless. Cooperative.    Labs and Imaging on Admission: I have personally reviewed following labs and imaging studies  CBC: Recent Labs  Lab 06/23/22 1852 06/24/22 0443 06/24/22 1413 06/26/22 0603 06/28/22 1615  WBC 6.0 5.8 6.3 4.9 7.3  NEUTROABS 3.7  --   --   --  4.7  HGB 13.7 12.6 11.4* 12.3 13.1  HCT 41.5 37.9 35.1* 37.8 39.2  MCV 86.5 85.9 85.8 86.5 83.9  PLT 193 169 163 144* 196   Basic Metabolic Panel: Recent Labs  Lab 06/23/22 1852  06/24/22 0443 06/24/22 1413 06/26/22 0603 06/28/22 1615  NA 147* 144 145 142 145  K 3.8 3.4* 3.4* 3.1* 3.6  CL 113* 112* 112* 112* 118*  CO2 22 23 22  19* 14*  GLUCOSE 104* 110* 97 78 104*  BUN 20 17 15 7 9   CREATININE 1.09* 1.05* 1.02* 0.71 0.82  CALCIUM 12.7* 11.5* 11.4* 10.3 11.8*  MG  --   --   --  1.7  --    GFR: Estimated Creatinine Clearance: 68.7 mL/min (by C-G formula based on SCr of 0.82 mg/dL). Liver Function Tests: Recent Labs  Lab 06/23/22 1852 06/24/22 1413 06/26/22 0603 06/28/22 1615  AST 16 23 22 24   ALT 23 20 22 25   ALKPHOS 75 66 63 72  BILITOT 0.9 0.9 0.8 0.9  PROT 8.3* 7.0 6.8 7.7  ALBUMIN 4.5 3.7 3.6 4.1   No results for input(s): "LIPASE", "AMYLASE" in the last 168 hours. Recent Labs  Lab 06/23/22 1835  AMMONIA 19   Coagulation Profile: Recent Labs  Lab 06/24/22 0111  INR 1.4*   Cardiac Enzymes: No results for input(s): "CKTOTAL", "CKMB", "CKMBINDEX", "TROPONINI" in the last 168 hours. BNP (last 3 results) No results for input(s): "PROBNP" in the last 8760 hours. HbA1C: No results for input(s): "HGBA1C" in the last 72 hours. CBG: No results for input(s): "GLUCAP" in the last 168 hours. Lipid Profile: No results for input(s): "CHOL", "HDL", "LDLCALC", "TRIG", "CHOLHDL", "LDLDIRECT" in the last 72 hours. Thyroid Function Tests: No results for input(s): "TSH", "T4TOTAL", "FREET4", "T3FREE", "THYROIDAB" in the last 72 hours. Anemia Panel: No results for input(s): "VITAMINB12", "FOLATE", "FERRITIN", "TIBC", "IRON", "RETICCTPCT" in the last 72 hours. Urine analysis:    Component Value Date/Time   COLORURINE STRAW (A) 06/28/2022 1827   APPEARANCEUR CLEAR 06/28/2022 1827   LABSPEC 1.010 06/28/2022 1827   PHURINE 5.0 06/28/2022 1827   GLUCOSEU NEGATIVE 06/28/2022 1827   HGBUR NEGATIVE 06/28/2022 1827   BILIRUBINUR NEGATIVE 06/28/2022 1827   KETONESUR 80 (A) 06/28/2022 1827   PROTEINUR NEGATIVE 06/28/2022 1827   UROBILINOGEN 1.0  09/20/2015 1448   NITRITE NEGATIVE 06/28/2022 1827   LEUKOCYTESUR NEGATIVE 06/28/2022 1827   Sepsis Labs: @LABRCNTIP (procalcitonin:4,lacticidven:4) ) Recent Results (from the past 240 hour(s))  Urine Culture     Status: Abnormal   Collection Time: 06/23/22  7:49 PM   Specimen: Urine, Clean Catch  Result Value Ref Range  Status   Specimen Description   Final    URINE, CLEAN CATCH Performed at [facility], [address] [place], [place] [address]    Special Requests   Final    NONE Performed at [facility], [address], [place], [place] [address]    Culture (A)  Final    >=100,000 COLONIES/mL MULTIPLE SPECIES PRESENT, SUGGEST RECOLLECTION   Report Status 06/25/2022 FINAL  Final  Culture, blood (routine x 2)     Status: None (Preliminary result)   Collection Time: 06/23/22  8:55 PM   Specimen: BLOOD  Result Value Ref Range Status   Specimen Description   Final    BLOOD RIGHT ANTECUBITAL Performed at [facility], [address], [place], [place] [address]    Special Requests   Final    BOTTLES DRAWN AEROBIC AND ANAEROBIC Blood Culture adequate volume Performed at [facility], [address], [place], [place] [address]    Culture   Final    NO GROWTH 4 DAYS Performed at [facility], [address] [place], [place] [address]    Report Status PENDING  Incomplete  Culture, blood (routine x 2)     Status: None (Preliminary result)   Collection Time: 06/23/22  9:20 PM   Specimen: BLOOD  Result Value Ref Range Status   Specimen Description   Final    BLOOD BLOOD RIGHT HAND Performed at [facility], [address], [place], [place] [address]    Special Requests   Final    BOTTLES DRAWN AEROBIC AND ANAEROBIC Blood Culture adequate volume Performed at [facility], [address] [place], [place] [address]    Culture  Setup Time   Final    GRAM  POSITIVE RODS ANAEROBIC BOTTLE ONLY CRITICAL RESULT CALLED TO, READ BACK BY AND VERIFIED WITH: PHARMD CRYSTAL ROBERTSON ON 06/28/22 @ 1610 BY DRT Performed at [facility], [address], [place], [place] [address]    Culture GRAM POSITIVE RODS  Final   Report Status PENDING  Incomplete  SARS Coronavirus 2 by RT PCR (hospital order, performed in [facility] hospital lab) *cepheid single result test* Anterior Nasal Swab     Status: None   Collection Time: 06/23/22  9:34 PM   Specimen: Anterior Nasal Swab  Result Value Ref Range Status   SARS Coronavirus 2 by RT PCR NEGATIVE NEGATIVE Final    Comment: (NOTE) SARS-CoV-2 target nucleic acids are NOT DETECTED.  The SARS-CoV-2 RNA is generally detectable in upper and lower respiratory specimens during the acute phase of infection. The lowest concentration of SARS-CoV-2 viral copies this assay can detect is 250 copies / mL. A negative result does not preclude SARS-CoV-2 infection and should not be used as the sole basis for treatment or other patient management decisions.  A negative result may occur with improper specimen collection / handling, submission of specimen other than nasopharyngeal swab, presence of viral mutation(s) within the areas targeted by this assay, and inadequate number of viral copies (<250 copies / mL). A negative result must be combined with clinical observations, patient history, and epidemiological information.  Fact Sheet for Patients:   RoadLapTop.co.za  Fact Sheet for Healthcare Providers: http://kim-miller.com/  This test is not yet approved or  cleared by the [place] FDA and has been authorized for detection and/or diagnosis of SARS-CoV-2 by FDA under an Emergency Use Authorization (EUA).  This EUA will remain  in effect (meaning this test can be used) for the duration of the COVID-19 declaration under Section 564(b)(1) of the Act, 21 U.S.C. section  360bbb-3(b)(1), unless the authorization is terminated or revoked sooner.  Performed at [facility], [address] [place], [place] [address]   Blood Culture (routine x 2)     Status: None (Preliminary result)   Collection Time: 06/28/22  4:15 PM   Specimen: BLOOD  Result Value Ref Range Status   Specimen Description   Final    BLOOD SITE NOT SPECIFIED Performed at [facility], [address], [place], [place] [address]    Special Requests   Final    BOTTLES DRAWN AEROBIC ONLY Blood Culture adequate volume Performed at [facility], [address], [place], [place] [address]    Culture PENDING  Incomplete   Report Status PENDING  Incomplete     Radiological Exams on Admission: DG Chest Port 1 View  Result Date: 06/28/2022 CLINICAL DATA:  Questionable sepsis. EXAM: PORTABLE CHEST 1 VIEW COMPARISON:  Chest x-ray 06/23/2022.  CT chest 02/21/2022 FINDINGS: Heart size is within normal limits. There is prominence of the right paratracheal region corresponding to ectatic esophagus on recent CT. This is unchanged. Both lungs are clear. The visualized skeletal structures are unremarkable. IMPRESSION: No acute cardiopulmonary process. Electronically Signed   By: Darliss Cheney M.D.   On: 06/28/2022 15:17   CT Head Wo Contrast  Result Date: 06/28/2022 CLINICAL DATA:  Altered mental status. EXAM: CT HEAD WITHOUT CONTRAST TECHNIQUE: Contiguous axial images were obtained from the base of the skull through the vertex without intravenous contrast. RADIATION DOSE REDUCTION: This exam was performed according to the departmental dose-optimization program which includes automated exposure control, adjustment of the mA and/or kV according to patient size and/or use of iterative reconstruction technique. COMPARISON:  Head CT dated 06/23/2022. FINDINGS: Brain: The ventricles and sulci are appropriate size for patient's age. Periventricular and deep white matter  chronic microvascular ischemic changes, advanced for the patient's age and similar to prior CT. There is no acute intracranial hemorrhage. No mass effect or midline shift. No extra-axial fluid collection. Vascular: No hyperdense vessel or unexpected calcification. Skull: Normal. Negative for fracture or focal lesion. Sinuses/Orbits: Chronic appearing opacification of several ethmoid air cells with remodeling. No air-fluid level. The mastoid air cells are clear. Other: None IMPRESSION: 1. No acute intracranial pathology. 2. Chronic microvascular ischemic changes similar to prior CT. Electronically Signed   By: Elgie Collard M.D.   On: 06/28/2022 14:58    EKG: Independently reviewed. Sinus rhythm, incomplete LBBB, LVH with repolarization abnormality.   Assessment/Plan   1. Acute encephalopathy  - Presents with increased anxiety and restlessness, not eating or drinking consistently  - She has had cognitive deficits and anxiety since her hemorrhagic CVA, possibly worse now from infection  - No acute findings on head CT  - Antibiotics, delirium precautions, check AMS labs, supportive care    2. Gram positive rod bacteremia  - 1 of 4 bottles from 06/26/22 growing gram positive bacilli; she was febrile at that time, treated with 3 days Rocephin and then 3 days cefdinir for suspected UTI but was not having urinary sxs and urine culture grew multiple species  - Blood cultures repeated in ED and Zosyn started after discussion with pharmacy  - Continue Zosyn, follow cultures    3. Hx of ICH  - Residual spastic hemiplegia and cognitive deficits  - No acute findings on  head CT in ED   - Pt's daughter feels unable to adequately care for the patient despite home health services and inquires about SNF placement; PT and [facility] consults requested   4. Hx of PE  - Continue Eliquis    5. Anxiety  - Continue Zoloft and as-needed hydroxyzine    6. Hypertension  - Continue Norvasc, Coreg, and hydralazine     7. Acidosis  - Serum bicarbonate is 14 with normal AG  - Lactate is 0.7  - Check blood gas    DVT prophylaxis: Eliquis  Code Status: Full  Level of Care: Level of care: Med-Surg Family Communication: Daughter at bedside  Disposition Plan:  Patient is from: Home  Anticipated d/c is to: SNF  Anticipated d/c date is: Possibly as early as 7/13 or 06/30/22  Patient currently: pending blood culture speciation/sensitivities, PT eval   Consults called: none  Admission status: Observation     Briscoe Deutscher, MD [facility]  06/28/2022, 10:43 PM

## 2022-06-29 ENCOUNTER — Other Ambulatory Visit: Payer: Self-pay

## 2022-06-29 DIAGNOSIS — N390 Urinary tract infection, site not specified: Secondary | ICD-10-CM | POA: Diagnosis present

## 2022-06-29 DIAGNOSIS — R1310 Dysphagia, unspecified: Secondary | ICD-10-CM | POA: Diagnosis present

## 2022-06-29 DIAGNOSIS — I10 Essential (primary) hypertension: Secondary | ICD-10-CM | POA: Diagnosis present

## 2022-06-29 DIAGNOSIS — Z79899 Other long term (current) drug therapy: Secondary | ICD-10-CM | POA: Diagnosis not present

## 2022-06-29 DIAGNOSIS — Z515 Encounter for palliative care: Secondary | ICD-10-CM | POA: Diagnosis not present

## 2022-06-29 DIAGNOSIS — R531 Weakness: Secondary | ICD-10-CM | POA: Diagnosis not present

## 2022-06-29 DIAGNOSIS — Z8249 Family history of ischemic heart disease and other diseases of the circulatory system: Secondary | ICD-10-CM | POA: Diagnosis not present

## 2022-06-29 DIAGNOSIS — Z7189 Other specified counseling: Secondary | ICD-10-CM | POA: Diagnosis not present

## 2022-06-29 DIAGNOSIS — I69354 Hemiplegia and hemiparesis following cerebral infarction affecting left non-dominant side: Secondary | ICD-10-CM | POA: Diagnosis not present

## 2022-06-29 DIAGNOSIS — G9341 Metabolic encephalopathy: Secondary | ICD-10-CM | POA: Diagnosis present

## 2022-06-29 DIAGNOSIS — Z86711 Personal history of pulmonary embolism: Secondary | ICD-10-CM | POA: Diagnosis not present

## 2022-06-29 DIAGNOSIS — F32A Depression, unspecified: Secondary | ICD-10-CM | POA: Diagnosis present

## 2022-06-29 DIAGNOSIS — R1319 Other dysphagia: Secondary | ICD-10-CM | POA: Diagnosis not present

## 2022-06-29 DIAGNOSIS — E876 Hypokalemia: Secondary | ICD-10-CM | POA: Diagnosis not present

## 2022-06-29 DIAGNOSIS — Z888 Allergy status to other drugs, medicaments and biological substances status: Secondary | ICD-10-CM | POA: Diagnosis not present

## 2022-06-29 DIAGNOSIS — G9340 Encephalopathy, unspecified: Secondary | ICD-10-CM | POA: Diagnosis present

## 2022-06-29 DIAGNOSIS — F411 Generalized anxiety disorder: Secondary | ICD-10-CM | POA: Diagnosis present

## 2022-06-29 DIAGNOSIS — E860 Dehydration: Secondary | ICD-10-CM | POA: Diagnosis present

## 2022-06-29 DIAGNOSIS — Z20822 Contact with and (suspected) exposure to covid-19: Secondary | ICD-10-CM | POA: Diagnosis present

## 2022-06-29 DIAGNOSIS — R7881 Bacteremia: Secondary | ICD-10-CM | POA: Diagnosis present

## 2022-06-29 DIAGNOSIS — Z7901 Long term (current) use of anticoagulants: Secondary | ICD-10-CM | POA: Diagnosis not present

## 2022-06-29 DIAGNOSIS — I69119 Unspecified symptoms and signs involving cognitive functions following nontraumatic intracerebral hemorrhage: Secondary | ICD-10-CM | POA: Diagnosis not present

## 2022-06-29 DIAGNOSIS — Z9851 Tubal ligation status: Secondary | ICD-10-CM | POA: Diagnosis not present

## 2022-06-29 DIAGNOSIS — E872 Acidosis, unspecified: Secondary | ICD-10-CM | POA: Diagnosis present

## 2022-06-29 LAB — BASIC METABOLIC PANEL
Anion gap: 11 (ref 5–15)
BUN: 9 mg/dL (ref 6–20)
CO2: 19 mmol/L — ABNORMAL LOW (ref 22–32)
Calcium: 10.9 mg/dL — ABNORMAL HIGH (ref 8.9–10.3)
Chloride: 115 mmol/L — ABNORMAL HIGH (ref 98–111)
Creatinine, Ser: 0.68 mg/dL (ref 0.44–1.00)
GFR, Estimated: >60 mL/min (ref >60)
Glucose, Bld: 125 mg/dL — ABNORMAL HIGH (ref 70–99)
Potassium: 3 mmol/L — ABNORMAL LOW (ref 3.5–5.1)
Sodium: 145 mmol/L (ref 135–145)

## 2022-06-29 LAB — CBC
HCT: 35.8 % — ABNORMAL LOW (ref 36.0–46.0)
Hemoglobin: 12.5 g/dL (ref 12.0–15.0)
MCH: 28.6 pg (ref 26.0–34.0)
MCHC: 34.9 g/dL (ref 30.0–36.0)
MCV: 81.9 fL (ref 80.0–100.0)
Platelets: 178 10*3/uL (ref 150–400)
RBC: 4.37 MIL/uL (ref 3.87–5.11)
RDW: 13 % (ref 11.5–15.5)
WBC: 6.8 10*3/uL (ref 4.0–10.5)
nRBC: 0 % (ref 0.0–0.2)

## 2022-06-29 LAB — TSH: TSH: 1.252 u[IU]/mL (ref 0.350–4.500)

## 2022-06-29 LAB — URINE CULTURE: Culture: NO GROWTH

## 2022-06-29 LAB — RPR: RPR Ser Ql: NONREACTIVE

## 2022-06-29 LAB — CULTURE, BLOOD (ROUTINE X 2)
Culture: NO GROWTH
Special Requests: ADEQUATE

## 2022-06-29 LAB — AMMONIA: Ammonia: 25 umol/L (ref 9–35)

## 2022-06-29 LAB — PROCALCITONIN: Procalcitonin: <0.1 ng/mL

## 2022-06-29 LAB — VITAMIN B12: Vitamin B-12: 379 pg/mL (ref 180–914)

## 2022-06-29 MED ORDER — MELATONIN 3 MG PO TABS
3.0000 mg | ORAL_TABLET | Freq: Every day | ORAL | Status: DC
  Administered 2022-06-29 – 2022-07-04 (×3): 3 mg via ORAL
  Filled 2022-06-29 (×5): qty 1

## 2022-06-29 MED ORDER — POTASSIUM CHLORIDE CRYS ER 20 MEQ PO TBCR
40.0000 meq | EXTENDED_RELEASE_TABLET | Freq: Once | ORAL | Status: AC
  Administered 2022-06-29: 40 meq via ORAL
  Filled 2022-06-29: qty 2

## 2022-06-29 MED ORDER — HYDRALAZINE HCL 20 MG/ML IJ SOLN
5.0000 mg | Freq: Four times a day (QID) | INTRAMUSCULAR | Status: DC | PRN
  Administered 2022-06-29 – 2022-07-01 (×3): 5 mg via INTRAVENOUS
  Filled 2022-06-29 (×3): qty 1

## 2022-06-29 MED ORDER — POTASSIUM CHLORIDE IN NACL 40-0.9 MEQ/L-% IV SOLN
INTRAVENOUS | Status: AC
  Filled 2022-06-29 (×2): qty 1000

## 2022-06-29 MED ORDER — METOPROLOL TARTRATE 5 MG/5ML IV SOLN
2.5000 mg | Freq: Four times a day (QID) | INTRAVENOUS | Status: DC | PRN
  Administered 2022-07-01: 2.5 mg via INTRAVENOUS
  Filled 2022-06-29: qty 5

## 2022-06-29 NOTE — Progress Notes (Signed)
PT Cancellation Note  Patient Details Name: Sarah Myers MRN: [id number] DOB: Aug 25, 1971   Cancelled Treatment:    Reason Eval/Treat Not Completed: Patient not medically ready. Will check back tomorrow. Blanchard Kelch PT Acute Rehabilitation Services Office [phone] Weekend [phone]    Rada Hay 06/29/2022, 12:45 PM

## 2022-06-29 NOTE — Telephone Encounter (Signed)
Spoke with Marcie Bal and informed her that all orders are approved except palliative care. She stated the patient is not looking good and that is what prompted the conversation. She stated she will have the daughter call here to inform us

## 2022-06-29 NOTE — Progress Notes (Signed)
Notified provider on call that patient was made NPO earlier due to drowsiness, disorientation, and not being alert. Provided made aware of patient's blood pressure and inability to take her daytime PO medications. NGT to be placed for medication administration if unable to take medications.  Patient now more alert and oriented to person and place, stating hospital. She is disoriented to situation and time. Patient was cooperative and able to pass yale swallow screening at bedside. She was able to swallow and tolerate water, soda, and 1 small pill, her Eliquis. Upon giving her the remained of her nighttime meds, she became agitated, combative, and was spitting out the pills and applesauce containing the meds. She did not receive her Amantadine, Baclofen, Gabapentin, and Zoloft. Her BP is improving and we are obtaining vitals every four hours. Instructed to keep patient NPO with sips of medications as ordered and to ensure patient is evaluated by speech on 06/29/22 to safely prescribe a diet.  Will continue to monitor vital signs and medicate as appropriate.

## 2022-06-29 NOTE — Progress Notes (Signed)
[facility] PROGRESS NOTE    Progress Note  Sarah Myers  YHC:[id number] DOB: September 14, 1971 DOA: 06/28/2022 PCP: Shayne Alken, MD     Brief Narrative:   Sarah Myers is an 51 y.o. female past medical history significant for nontraumatic intracranial hemorrhage, with spastic hemiplegia and cognitive deficits, bilateral PE in March of this year on Eliquis, essential hypertension now comes in to the ED for increased anxiety and decreased oral intake and increased worsening deficits since discharge from inpatient rehab in April, she was recently discharged on 06/26/2022 for which she was treated empirically with IV Rocephin for SIRS but no source of infection found.  Right since discharge the patient has continued increased anxiety and restlessness not eating and drinking well 2 out of 2 blood cultures from 06/23/2022 is growing gram-positive rods    Assessment/Plan:   Acute encephalopathy According to the daughter the patient has had poor oral intake. CT of the head showed no acute findings. Delirium precautions. He was started empirically on antibiotics.  Gram-positive rod bacteremia: On 06/23/2022 blood cultures were positive for gram-positive rods 2 out of 2 surveillance blood cultures have been repeated today. She was started empirically on IV Zosyn.  History of intracranial hemorrhage: With residual spastic hemiplegia and cognitive deficit. CT of the head showed no acute findings. Patient daughter relates she is unable to care for her mom despite home health services she would like skilled nursing facility placement.  History of PE: Continue Eliquis.  Anxiety: Continue Zoloft and hydroxyzine.  Central hypertension: continue current regimen.  Normal anion gap metabolic acidosis: Started on fluid resuscitation and is now resolved.  Hypokalemia: Replete orally recheck in the morning.   DVT prophylaxis: lovenox Family  Communication:daughter Status is: Observation The patient will require care spanning > 2 midnights and should be moved to inpatient because: Acute metabolic encephalopathy probably due to bacteremia    Code Status:     Code Status Orders  (From admission, onward)           Start     Ordered   06/28/22 1953  Full code  Continuous        06/28/22 1953           Code Status History     Date Active Date Inactive Code Status Order ID Comments User Context   06/23/2022 2206 06/26/2022 1812 Full Code [id number]  Mansy, Vernetta Honey, MD ED   04/04/2022 1526 04/26/2022 1812 Full Code [id number]  Milinda Antis, PA-C Inpatient   03/22/2022 0523 04/04/2022 1517 Full Code [id number]  Leane Para, CNA Inpatient   02/05/2022 0849 03/22/2022 0350 Full Code [id number]  Jefferson Fuel, MD ED   02/10/2012 1504 02/11/2012 1448 Full Code [id number]  Lorenso Quarry H Inpatient         IV Access:   Peripheral IV   Procedures and diagnostic studies:   DG Chest Port 1 View  Result Date: 06/28/2022 CLINICAL DATA:  Questionable sepsis. EXAM: PORTABLE CHEST 1 VIEW COMPARISON:  Chest x-ray 06/23/2022.  CT chest 02/21/2022 FINDINGS: Heart size is within normal limits. There is prominence of the right paratracheal region corresponding to ectatic esophagus on recent CT. This is unchanged. Both lungs are clear. The visualized skeletal structures are unremarkable. IMPRESSION: No acute cardiopulmonary process. Electronically Signed   By: Darliss Cheney M.D.   On: 06/28/2022 15:17   CT Head Wo Contrast  Result Date: 06/28/2022 CLINICAL DATA:  Altered mental status. EXAM: CT HEAD WITHOUT CONTRAST TECHNIQUE:  Contiguous axial images were obtained from the base of the skull through the vertex without intravenous contrast. RADIATION DOSE REDUCTION: This exam was performed according to the departmental dose-optimization program which includes automated exposure control, adjustment of the mA and/or kV according to patient size  and/or use of iterative reconstruction technique. COMPARISON:  Head CT dated 06/23/2022. FINDINGS: Brain: The ventricles and sulci are appropriate size for patient's age. Periventricular and deep white matter chronic microvascular ischemic changes, advanced for the patient's age and similar to prior CT. There is no acute intracranial hemorrhage. No mass effect or midline shift. No extra-axial fluid collection. Vascular: No hyperdense vessel or unexpected calcification. Skull: Normal. Negative for fracture or focal lesion. Sinuses/Orbits: Chronic appearing opacification of several ethmoid air cells with remodeling. No air-fluid level. The mastoid air cells are clear. Other: None IMPRESSION: 1. No acute intracranial pathology. 2. Chronic microvascular ischemic changes similar to prior CT. Electronically Signed   By: Elgie Collard M.D.   On: 06/28/2022 14:58     Medical Consultants:   None.   Subjective:    Sarah Myers patient is moaning and groaning, daughter relates she has not been complaining of any dysuria.  Objective:    Vitals:   06/29/22 0330 06/29/22 0400 06/29/22 0430 06/29/22 0500  BP: (!) 151/106 (!) 166/98 (!) 159/99 (!) 172/121  Pulse: 100 87 79 (!) 106  Resp:   18   Temp:      TempSrc:      SpO2: 98% 99% 98% 99%   SpO2: 99 %  No intake or output data in the 24 hours ending 06/29/22 0646 There were no vitals filed for this visit.  Exam: General exam: In no acute distress. Respiratory system: Good air movement and clear to auscultation. Cardiovascular system: S1 & S2 heard, RRR. No JVD,. Gastrointestinal system: Abdomen is nondistended, soft and nontender.  Extremities: No pedal edema. Skin: No rashes, lesions or ulcers Psychiatry: No judgment or insight of medical condition.   Data Reviewed:    Labs: Basic Metabolic Panel: Recent Labs  Lab 06/24/22 0443 06/24/22 1413 06/26/22 0603 06/28/22 1615 06/29/22 0528  NA 144 145 142 145 145  K 3.4*  3.4* 3.1* 3.6 3.0*  CL 112* 112* 112* 118* 115*  CO2 23 22 19* 14* 19*  GLUCOSE 110* 97 78 104* 125*  BUN 17 15 7 9 9   CREATININE 1.05* 1.02* 0.71 0.82 0.68  CALCIUM 11.5* 11.4* 10.3 11.8* 10.9*  MG  --   --  1.7  --   --    GFR Estimated Creatinine Clearance: 70.4 mL/min (by C-G formula based on SCr of 0.68 mg/dL). Liver Function Tests: Recent Labs  Lab 06/23/22 1852 06/24/22 1413 06/26/22 0603 06/28/22 1615  AST 16 23 22 24   ALT 23 20 22 25   ALKPHOS 75 66 63 72  BILITOT 0.9 0.9 0.8 0.9  PROT 8.3* 7.0 6.8 7.7  ALBUMIN 4.5 3.7 3.6 4.1   No results for input(s): "LIPASE", "AMYLASE" in the last 168 hours. Recent Labs  Lab 06/23/22 1835  AMMONIA 19   Coagulation profile Recent Labs  Lab 06/24/22 0111  INR 1.4*   COVID-19 Labs  No results for input(s): "DDIMER", "FERRITIN", "LDH", "CRP" in the last 72 hours.  Lab Results  Component Value Date   SARSCOV2NAA NEGATIVE 06/23/2022   SARSCOV2NAA NEGATIVE 02/05/2022    CBC: Recent Labs  Lab 06/23/22 1852 06/24/22 0443 06/24/22 1413 06/26/22 0603 06/28/22 1615 06/29/22 0528  WBC 6.0 5.8 6.3  4.9 7.3 6.8  NEUTROABS 3.7  --   --   --  4.7  --   HGB 13.7 12.6 11.4* 12.3 13.1 12.5  HCT 41.5 37.9 35.1* 37.8 39.2 35.8*  MCV 86.5 85.9 85.8 86.5 83.9 81.9  PLT 193 169 163 144* 196 178   Cardiac Enzymes: No results for input(s): "CKTOTAL", "CKMB", "CKMBINDEX", "TROPONINI" in the last 168 hours. BNP (last 3 results) No results for input(s): "PROBNP" in the last 8760 hours. CBG: No results for input(s): "GLUCAP" in the last 168 hours. D-Dimer: No results for input(s): "DDIMER" in the last 72 hours. Hgb A1c: No results for input(s): "HGBA1C" in the last 72 hours. Lipid Profile: No results for input(s): "CHOL", "HDL", "LDLCALC", "TRIG", "CHOLHDL", "LDLDIRECT" in the last 72 hours. Thyroid function studies: No results for input(s): "TSH", "T4TOTAL", "T3FREE", "THYROIDAB" in the last 72 hours.  Invalid input(s):  "FREET3" Anemia work up: No results for input(s): "VITAMINB12", "FOLATE", "FERRITIN", "TIBC", "IRON", "RETICCTPCT" in the last 72 hours. Sepsis Labs: Recent Labs  Lab 06/24/22 0443 06/24/22 1413 06/26/22 0603 06/28/22 1615 06/28/22 1827 06/28/22 2221 06/29/22 0528  PROCALCITON  --   --   --   --   --  <0.10  --   WBC 5.8 6.3 4.9 7.3  --   --  6.8  LATICACIDVEN 0.9 1.8  --  0.7 0.7  --   --    Microbiology Recent Results (from the past 240 hour(s))  Urine Culture     Status: Abnormal   Collection Time: 06/23/22  7:49 PM   Specimen: Urine, Clean Catch  Result Value Ref Range Status   Specimen Description   Final    URINE, CLEAN CATCH Performed at [facility], [address] [place], [place] [address]    Special Requests   Final    NONE Performed at [facility], [address], [place], [place] [address]    Culture (A)  Final    >=100,000 COLONIES/mL MULTIPLE SPECIES PRESENT, SUGGEST RECOLLECTION   Report Status 06/25/2022 FINAL  Final  Culture, blood (routine x 2)     Status: None (Preliminary result)   Collection Time: 06/23/22  8:55 PM   Specimen: BLOOD  Result Value Ref Range Status   Specimen Description   Final    BLOOD RIGHT ANTECUBITAL Performed at [facility], [address], [place], [place] [address]    Special Requests   Final    BOTTLES DRAWN AEROBIC AND ANAEROBIC Blood Culture adequate volume Performed at [facility], [address] [place], [place] [address]    Culture   Final    NO GROWTH 4 DAYS Performed at [facility], [address], [place], [place] [address]    Report Status PENDING  Incomplete  Culture, blood (routine x 2)     Status: None (Preliminary result)   Collection Time: 06/23/22  9:20 PM   Specimen: BLOOD  Result Value Ref Range Status   Specimen Description   Final    BLOOD BLOOD RIGHT HAND Performed at [facility], [address], [place], [place] [address]    Special Requests   Final    BOTTLES DRAWN AEROBIC AND ANAEROBIC Blood Culture adequate volume Performed at [facility], [address], [place], [place] [address]    Culture  Setup Time   Final    GRAM POSITIVE RODS ANAEROBIC BOTTLE ONLY CRITICAL RESULT CALLED TO, READ BACK  BY AND VERIFIED WITH: PHARMD CRYSTAL ROBERTSON ON 06/28/22 @ 1610 BY DRT Performed at [facility], [address] [place], [place] [address]    Culture GRAM POSITIVE RODS  Final   Report Status PENDING  Incomplete  SARS Coronavirus 2 by RT PCR (hospital order, performed in [facility] hospital lab) *cepheid single result test* Anterior Nasal Swab     Status: None   Collection Time: 06/23/22  9:34 PM   Specimen: Anterior Nasal Swab  Result Value Ref Range Status   SARS Coronavirus 2 by RT PCR NEGATIVE NEGATIVE Final    Comment: (NOTE) SARS-CoV-2 target nucleic acids are NOT DETECTED.  The SARS-CoV-2 RNA is generally detectable in upper and lower respiratory specimens during the acute phase of infection. The lowest concentration of SARS-CoV-2 viral copies this assay can detect is 250 copies / mL. A negative result does not preclude SARS-CoV-2 infection and should not be used as the sole basis for treatment or other patient management decisions.  A negative result may occur with improper specimen collection / handling, submission of specimen other than nasopharyngeal swab, presence of viral mutation(s) within the areas targeted by this assay, and inadequate number of viral copies (<250 copies / mL). A negative result must be combined with clinical observations, patient history, and epidemiological information.  Fact Sheet for Patients:   RoadLapTop.co.za  Fact Sheet for Healthcare Providers: http://kim-miller.com/  This test is not yet approved or  cleared by the [place] FDA and has been  authorized for detection and/or diagnosis of SARS-CoV-2 by FDA under an Emergency Use Authorization (EUA).  This EUA will remain in effect (meaning this test can be used) for the duration of the COVID-19 declaration under Section 564(b)(1) of the Act, 21 U.S.C. section 360bbb-3(b)(1), unless the authorization is terminated or revoked sooner.  Performed at [facility], [address] [place], [place] [address]   Blood Culture (routine x 2)     Status: None (Preliminary result)   Collection Time: 06/28/22  4:15 PM   Specimen: BLOOD  Result Value Ref Range Status   Specimen Description   Final    BLOOD SITE NOT SPECIFIED Performed at [facility], [address] [place], [place] [address]    Special Requests   Final    BOTTLES DRAWN AEROBIC ONLY Blood Culture adequate volume Performed at [facility], [address], [place], [place] [address]    Culture PENDING  Incomplete   Report Status PENDING  Incomplete     Medications:    amantadine  100 mg Oral BID   amLODipine  10 mg Oral Daily   apixaban  5 mg Oral BID   baclofen  5-15 mg Oral BID   carvedilol  6.25 mg Oral BID WC   gabapentin  100 mg Oral BID   hydrALAZINE  50 mg Oral Q6H   losartan  25 mg Oral Daily   melatonin  3 mg Oral QHS   podofilox   Topical BID   potassium chloride  40 mEq Oral Once   sertraline  25 mg Oral QHS   Continuous Infusions:  piperacillin-tazobactam (ZOSYN)  IV 3.375 g (06/29/22 0528)      LOS: 0 days   Marinda Elk  [facility]  06/29/2022, 6:46 AM

## 2022-06-29 NOTE — ED Notes (Signed)
Patient spit some of her potassium PO out and swallowed some.

## 2022-06-30 ENCOUNTER — Inpatient Hospital Stay (HOSPITAL_COMMUNITY): Payer: Commercial Managed Care - HMO

## 2022-06-30 DIAGNOSIS — G9340 Encephalopathy, unspecified: Secondary | ICD-10-CM | POA: Diagnosis not present

## 2022-06-30 DIAGNOSIS — G9341 Metabolic encephalopathy: Secondary | ICD-10-CM | POA: Diagnosis not present

## 2022-06-30 DIAGNOSIS — R7881 Bacteremia: Secondary | ICD-10-CM | POA: Diagnosis not present

## 2022-06-30 LAB — BASIC METABOLIC PANEL
Anion gap: 9 (ref 5–15)
BUN: 7 mg/dL (ref 6–20)
CO2: 18 mmol/L — ABNORMAL LOW (ref 22–32)
Calcium: 11.1 mg/dL — ABNORMAL HIGH (ref 8.9–10.3)
Chloride: 120 mmol/L — ABNORMAL HIGH (ref 98–111)
Creatinine, Ser: 0.79 mg/dL (ref 0.44–1.00)
GFR, Estimated: >60 mL/min (ref >60)
Glucose, Bld: 115 mg/dL — ABNORMAL HIGH (ref 70–99)
Potassium: 3.3 mmol/L — ABNORMAL LOW (ref 3.5–5.1)
Sodium: 147 mmol/L — ABNORMAL HIGH (ref 135–145)

## 2022-06-30 LAB — CULTURE, BLOOD (ROUTINE X 2): Special Requests: ADEQUATE

## 2022-06-30 MED ORDER — PENICILLIN G POTASSIUM 20000000 UNITS IJ SOLR
12.0000 10*6.[IU] | Freq: Two times a day (BID) | INTRAVENOUS | Status: DC
  Administered 2022-06-30 – 2022-07-04 (×7): 12 10*6.[IU] via INTRAVENOUS
  Filled 2022-06-30 (×9): qty 12

## 2022-06-30 MED ORDER — POTASSIUM CL IN DEXTROSE 5% 20 MEQ/L IV SOLN
20.0000 meq | INTRAVENOUS | Status: AC
  Administered 2022-06-30 – 2022-07-01 (×3): 20 meq via INTRAVENOUS
  Filled 2022-06-30 (×4): qty 1000

## 2022-06-30 MED ORDER — FOOD THICKENER (SIMPLYTHICK)
1.0000 | Freq: Three times a day (TID) | ORAL | Status: DC
  Administered 2022-06-30 – 2022-07-04 (×8): 1 via ORAL

## 2022-06-30 MED ORDER — ADULT MULTIVITAMIN W/MINERALS CH
1.0000 | ORAL_TABLET | Freq: Every day | ORAL | Status: DC
  Administered 2022-06-30 – 2022-07-05 (×3): 1 via ORAL
  Filled 2022-06-30 (×4): qty 1

## 2022-06-30 MED ORDER — SODIUM CHLORIDE 0.9 % IV SOLN: 2.0000 g | INTRAVENOUS | Status: DC

## 2022-06-30 MED ORDER — BOOST / RESOURCE BREEZE PO LIQD CUSTOM
1.0000 | Freq: Three times a day (TID) | ORAL | Status: DC
  Administered 2022-06-30 – 2022-07-04 (×2): 1 via ORAL

## 2022-06-30 NOTE — Progress Notes (Signed)
[facility] PROGRESS NOTE    Progress Note  [name]  NHA:[id number] DOB: 1971-10-16 DOA: 06/28/2022 PCP: Shayne Alken, MD     Brief Narrative:   Sarah Myers is an 51 y.o. female past medical history significant for nontraumatic intracranial hemorrhage, with spastic hemiplegia and cognitive deficits, bilateral PE in March of this year on Eliquis, essential hypertension now comes in Sarah the ED for increased anxiety and decreased oral intake and increased worsening deficits since discharge from inpatient rehab in April, she was recently discharged on 06/26/2022 for which she was treated empirically with IV Rocephin for SIRS but no source of infection found.  Right since discharge the patient has continued increased anxiety and restlessness not eating and drinking well 2 out of 2 blood cultures from 06/23/2022 is growing gram-positive rods   Assessment/Plan:   Acute metabolic encephalopathy According Sarah the daughter the patient has had poor oral intake. CT of the head showed no acute findings. More awake today, she relates she is significantly thirsty. Follow commands, encephalopathy possibly due Sarah infectious etiology. Check a speech evaluation Sarah start a diet.  Gram-positive rod bacteremia: On 06/23/2022 blood cultures were positive for gram-positive rods 2 out of 2 surveillance blood cultures have been repeated today. Has remained afebrile. We will consult ID  History of intracranial hemorrhage: With residual spastic hemiplegia and cognitive deficit. CT of the head showed no acute findings. Patient daughter relates she is unable Sarah care for her mom despite home health services she would like skilled nursing facility placement. Physical therapy Sarah reevaluate.  History of PE: Can resume Eliquis patient is more awake today.  Anxiety: Continue Zoloft and hydroxyzine.  Central hypertension: continue current regimen.  Normal anion gap metabolic  acidosis: Resolving with fluid resuscitation.  Hypokalemia: Replete orally recheck in the morning.   DVT prophylaxis: lovenox Family Communication:daughter Status is: Observation The patient will require care spanning > 2 midnights and should be moved Sarah inpatient because: Acute metabolic encephalopathy probably due Sarah bacteremia    Code Status:     Code Status Orders  (From admission, onward)           Start     Ordered   06/28/22 1953  Full code  Continuous        06/28/22 1953           Code Status History     Date Active Date Inactive Code Status Order ID Comments User Context   06/23/2022 2206 06/26/2022 1812 Full Code [id number]  Mansy, Vernetta Honey, MD ED   04/04/2022 1526 04/26/2022 1812 Full Code [id number]  Milinda Antis, PA-C Inpatient   03/22/2022 0523 04/04/2022 1517 Full Code [id number]  Leane Para, CNA Inpatient   02/05/2022 0849 03/22/2022 0350 Full Code [id number]  Jefferson Fuel, MD ED   02/10/2012 1504 02/11/2012 1448 Full Code [id number]  Lorenso Quarry H Inpatient         IV Access:   Peripheral IV   Procedures and diagnostic studies:   DG Shoulder 1V Left  Result Date: 06/30/2022 CLINICAL DATA:  Left shoulder pain EXAM: LEFT SHOULDER COMPARISON:  06/28/2022 FINDINGS: Limited single view exam. No gross malalignment, acute osseous finding, fracture, or arthropathy. AC joint also aligned. Included chest unremarkable. IMPRESSION: No acute abnormality Electronically Signed   By: Judie Petit.  Shick M.D.   On: 06/30/2022 08:04   DG Chest Port 1 View  Result Date: 06/28/2022 CLINICAL DATA:  Questionable sepsis. EXAM: PORTABLE CHEST 1 VIEW COMPARISON:  Chest x-ray 06/23/2022.  CT chest 02/21/2022 FINDINGS: Heart size is within normal limits. There is prominence of the right paratracheal region corresponding Sarah ectatic esophagus on recent CT. This is unchanged. Both lungs are clear. The visualized skeletal structures are unremarkable. IMPRESSION: No acute cardiopulmonary  process. Electronically Signed   By: Darliss Cheney M.D.   On: 06/28/2022 15:17   CT Head Wo Contrast  Result Date: 06/28/2022 CLINICAL DATA:  Altered mental status. EXAM: CT HEAD WITHOUT CONTRAST TECHNIQUE: Contiguous axial images were obtained from the base of the skull through the vertex without intravenous contrast. RADIATION DOSE REDUCTION: This exam was performed according Sarah the departmental dose-optimization program which includes automated exposure control, adjustment of the mA and/or kV according Sarah patient size and/or use of iterative reconstruction technique. COMPARISON:  Head CT dated 06/23/2022. FINDINGS: Brain: The ventricles and sulci are appropriate size for patient's age. Periventricular and deep white matter chronic microvascular ischemic changes, advanced for the patient's age and similar Sarah prior CT. There is no acute intracranial hemorrhage. No mass effect or midline shift. No extra-axial fluid collection. Vascular: No hyperdense vessel or unexpected calcification. Skull: Normal. Negative for fracture or focal lesion. Sinuses/Orbits: Chronic appearing opacification of several ethmoid air cells with remodeling. No air-fluid level. The mastoid air cells are clear. Other: None IMPRESSION: 1. No acute intracranial pathology. 2. Chronic microvascular ischemic changes similar Sarah prior CT. Electronically Signed   By: Elgie Collard M.D.   On: 06/28/2022 14:58     Medical Consultants:   None.   Subjective:    Sarah Myers more awake today relates that she is thirsty.  Objective:    Vitals:   06/29/22 1948 06/29/22 2350 06/30/22 0123 06/30/22 0445  BP: (!) 158/99 (!) 189/127 (!) 136/95 (!) 165/108  Pulse: (!) 101 (!) 106 92 100  Resp: 18 19  18   Temp: 98.9 F (37.2 C) 99 F (37.2 C)  98.6 F (37 C)  TempSrc:  Oral  Oral  SpO2: 99%   97%  Weight:      Height:       SpO2: 97 %   Intake/Output Summary (Last 24 hours) at 06/30/2022 0943 Last data filed at  06/30/2022 0435 Gross per 24 hour  Intake 1187.78 ml  Output --  Net 1187.78 ml   Filed Weights   06/29/22 1711  Weight: 56.3 kg    Exam: General exam: In no acute distress. Respiratory system: Good air movement and clear Sarah auscultation. Cardiovascular system: S1 & S2 heard, RRR. No JVD. Gastrointestinal system: Abdomen is nondistended, soft and nontender.  Extremities: No pedal edema. Skin: No rashes, lesions or ulcers Psychiatry: Judgement and insight appear normal. Mood & affect appropriate. Data Reviewed:    Labs: Basic Metabolic Panel: Recent Labs  Lab 06/24/22 0443 06/24/22 1413 06/26/22 0603 06/28/22 1615 06/29/22 0528  NA 144 145 142 145 145  K 3.4* 3.4* 3.1* 3.6 3.0*  CL 112* 112* 112* 118* 115*  CO2 23 22 19* 14* 19*  GLUCOSE 110* 97 78 104* 125*  BUN 17 15 7 9 9   CREATININE 1.05* 1.02* 0.71 0.82 0.68  CALCIUM 11.5* 11.4* 10.3 11.8* 10.9*  MG  --   --  1.7  --   --     GFR Estimated Creatinine Clearance: 70.4 mL/min (by C-G formula based on SCr of 0.68 mg/dL). Liver Function Tests: Recent Labs  Lab 06/23/22 1852 06/24/22 1413 06/26/22 0603 06/28/22 1615  AST 16 23 22 24   ALT  23 20 22 25   ALKPHOS 75 66 63 72  BILITOT 0.9 0.9 0.8 0.9  PROT 8.3* 7.0 6.8 7.7  ALBUMIN 4.5 3.7 3.6 4.1    No results for input(s): "LIPASE", "AMYLASE" in the last 168 hours. Recent Labs  Lab 06/23/22 1835 06/29/22 0645  AMMONIA 19 25    Coagulation profile Recent Labs  Lab 06/24/22 0111  INR 1.4*    COVID-19 Labs  No results for input(s): "DDIMER", "FERRITIN", "LDH", "CRP" in the last 72 hours.  Lab Results  Component Value Date   SARSCOV2NAA NEGATIVE 06/23/2022   SARSCOV2NAA NEGATIVE 02/05/2022    CBC: Recent Labs  Lab 06/23/22 1852 06/24/22 0443 06/24/22 1413 06/26/22 0603 06/28/22 1615 06/29/22 0528  WBC 6.0 5.8 6.3 4.9 7.3 6.8  NEUTROABS 3.7  --   --   --  4.7  --   HGB 13.7 12.6 11.4* 12.3 13.1 12.5  HCT 41.5 37.9 35.1* 37.8 39.2  35.8*  MCV 86.5 85.9 85.8 86.5 83.9 81.9  PLT 193 169 163 144* 196 178    Cardiac Enzymes: No results for input(s): "CKTOTAL", "CKMB", "CKMBINDEX", "TROPONINI" in the last 168 hours. BNP (last 3 results) No results for input(s): "PROBNP" in the last 8760 hours. CBG: No results for input(s): "GLUCAP" in the last 168 hours. D-Dimer: No results for input(s): "DDIMER" in the last 72 hours. Hgb A1c: No results for input(s): "HGBA1C" in the last 72 hours. Lipid Profile: No results for input(s): "CHOL", "HDL", "LDLCALC", "TRIG", "CHOLHDL", "LDLDIRECT" in the last 72 hours. Thyroid function studies: Recent Labs    06/29/22 0528  TSH 1.252   Anemia work up: Recent Labs    06/29/22 0528  VITAMINB12 379   Sepsis Labs: Recent Labs  Lab 06/24/22 0443 06/24/22 1413 06/26/22 0603 06/28/22 1615 06/28/22 1827 06/28/22 2221 06/29/22 0528  PROCALCITON  --   --   --   --   --  <0.10  --   WBC 5.8 6.3 4.9 7.3  --   --  6.8  LATICACIDVEN 0.9 1.8  --  0.7 0.7  --   --     Microbiology Recent Results (from the past 240 hour(s))  Urine Culture     Status: Abnormal   Collection Time: 06/23/22  7:49 PM   Specimen: Urine, Clean Catch  Result Value Ref Range Status   Specimen Description   Final    URINE, CLEAN CATCH Performed at [facility], [address] [place], [place]    Special Requests   Final    NONE Performed at [facility], [address], [place], [place]    Culture (A)  Final    >=100,000 COLONIES/mL MULTIPLE SPECIES PRESENT, SUGGEST RECOLLECTION   Report Status 06/25/2022 FINAL  Final  Culture, blood (routine x 2)     Status: None   Collection Time: 06/23/22  8:55 PM   Specimen: BLOOD  Result Value Ref Range Status   Specimen Description   Final    BLOOD RIGHT ANTECUBITAL Performed at [facility], [address], [place], [place]    Special Requests   Final    BOTTLES  DRAWN AEROBIC AND ANAEROBIC Blood Culture adequate volume Performed at [facility], [address], [place], [place]    Culture   Final    NO GROWTH 5 DAYS Performed at [facility], [address], [place], [place]    Report  Status 06/29/2022 FINAL  Final  Culture, blood (routine x 2)     Status: None (Preliminary result)   Collection Time: 06/23/22  9:20 PM   Specimen: BLOOD  Result Value Ref Range Status   Specimen Description   Final    BLOOD BLOOD RIGHT HAND Performed at [facility], [address], [place], [place] [address]    Special Requests   Final    BOTTLES DRAWN AEROBIC AND ANAEROBIC Blood Culture adequate volume Performed at [facility], [address], [place], [place] [address]    Culture  Setup Time   Final    GRAM POSITIVE RODS ANAEROBIC BOTTLE ONLY CRITICAL RESULT CALLED Sarah, READ BACK BY AND VERIFIED WITH: PHARMD CRYSTAL ROBERTSON ON 06/28/22 @ 1610 BY DRT Performed at [facility], [address], [place], [place] [address]    Culture GRAM POSITIVE RODS  Final   Report Status PENDING  Incomplete  SARS Coronavirus 2 by RT PCR (hospital order, performed in [facility] hospital lab) *cepheid single result test* Anterior Nasal Swab     Status: None   Collection Time: 06/23/22  9:34 PM   Specimen: Anterior Nasal Swab  Result Value Ref Range Status   SARS Coronavirus 2 by RT PCR NEGATIVE NEGATIVE Final    Comment: (NOTE) SARS-CoV-2 target nucleic acids are NOT DETECTED.  The SARS-CoV-2 RNA is generally detectable in upper and lower respiratory specimens during the acute phase of infection. The lowest concentration of SARS-CoV-2 viral copies this assay can detect is 250 copies / mL. A negative result does not preclude SARS-CoV-2 infection and should not be used as the sole basis for treatment or other patient management decisions.  A negative result may occur with improper  specimen collection / handling, submission of specimen other than nasopharyngeal swab, presence of viral mutation(s) within the areas targeted by this assay, and inadequate number of viral copies (<250 copies / mL). A negative result must be combined with clinical observations, patient history, and epidemiological information.  Fact Sheet for Patients:   RoadLapTop.co.za  Fact Sheet for Healthcare Providers: http://kim-miller.com/  This test is not yet approved or  cleared by the [place] FDA and has been authorized for detection and/or diagnosis of SARS-CoV-2 by FDA under an Emergency Use Authorization (EUA).  This EUA will remain in effect (meaning this test can be used) for the duration of the COVID-19 declaration under Section 564(b)(1) of the Act, 21 U.S.C. section 360bbb-3(b)(1), unless the authorization is terminated or revoked sooner.  Performed at [facility], [address] [place], [place] [address]   Blood Culture (routine x 2)     Status: None (Preliminary result)   Collection Time: 06/28/22  4:15 PM   Specimen: BLOOD  Result Value Ref Range Status   Specimen Description   Final    BLOOD SITE NOT SPECIFIED Performed at [facility], [address], [place], [place] [address]    Special Requests   Final    BOTTLES DRAWN AEROBIC ONLY Blood Culture adequate volume Performed at [facility], [address], [place], [place] [address]    Culture   Final    NO GROWTH 2 DAYS Performed at [facility], [address] [place], [place] [address]    Report Status PENDING  Incomplete  Blood Culture (routine x 2)     Status: None (Preliminary result)   Collection Time: 06/28/22  6:27 PM  Specimen: BLOOD  Result Value Ref Range Status   Specimen Description   Final    BLOOD RIGHT ANTECUBITAL Performed at [facility], [address] [place],  [place] [address]    Special Requests   Final    BOTTLES DRAWN AEROBIC ONLY Blood Culture adequate volume Performed at [facility], [address] [place], [place] [address]    Culture   Final    NO GROWTH 2 DAYS Performed at [facility], [address] [place], [place] [address]    Report Status PENDING  Incomplete  Urine Culture     Status: None   Collection Time: 06/28/22  6:27 PM   Specimen: In/Out Cath Urine  Result Value Ref Range Status   Specimen Description   Final    IN/OUT CATH URINE Performed at [facility], [address], [place], [place] [address]    Special Requests   Final    NONE Performed at [facility], [address] [place], [place] [address]    Culture   Final    NO GROWTH Performed at [facility], [address] [place], [place] [address]    Report Status 06/29/2022 FINAL  Final     Medications:    amantadine  100 mg Oral BID   amLODipine  10 mg Oral Daily   apixaban  5 mg Oral BID   baclofen  5-15 mg Oral BID   carvedilol  6.25 mg Oral BID WC   gabapentin  100 mg Oral BID   losartan  25 mg Oral Daily   melatonin  3 mg Oral QHS   podofilox   Topical BID   sertraline  25 mg Oral QHS   Continuous Infusions:  piperacillin-tazobactam (ZOSYN)  IV 3.375 g (06/30/22 0503)      LOS: 1 day   Marinda Elk  [facility]  06/30/2022, 9:43 AM

## 2022-06-30 NOTE — Progress Notes (Signed)
Initial Nutrition Assessment  INTERVENTION:   -Boost Breeze po TID, each supplement provides 250 kcal and 9 grams of protein  -thickened to nectar consistency  -Multivitamin with minerals daily  NUTRITION DIAGNOSIS:   Increased nutrient needs related to chronic illness as evidenced by estimated needs.  GOAL:   Patient will meet greater than or equal to 90% of their needs  MONITOR:   PO intake, Supplement acceptance, Labs, Weight trends, I & O's  REASON FOR ASSESSMENT:   Malnutrition Screening Tool    ASSESSMENT:   51 y.o. female past medical history significant for nontraumatic intracranial hemorrhage, with spastic hemiplegia and cognitive deficits, bilateral PE in March of this year on Eliquis, essential hypertension now comes in to the ED for increased anxiety and decreased oral intake and increased worsening deficits since discharge from inpatient rehab in April, she was recently discharged on 06/26/2022 for which she was treated empirically with IV Rocephin for SIRS but no source of infection found.  Patient in room, no family at bedside. Pt unable to provide history. Alert/oriented x 1.  SLP has evaluated today and recommended dysphagia 2 diet with nectar thick liquids.  Pt with PEG tube, does not use currently. Will order Boost Breeze supplements as per review of previous RD assessments, pt doesn't tolerate Ensure well.  Per weight records, pt has lost 39 lbs since 4/4 (23% wt loss x 3.5 months, significant for time frame).  Medications: D5 infusion  Labs reviewed: Elevated Na  NUTRITION - FOCUSED PHYSICAL EXAM:  Did not attempt given staff reports of pt becoming combative with care.  Diet Order:   Diet Order             DIET DYS 2 Room service appropriate? Yes; Fluid consistency: Nectar Thick  Diet effective now                   EDUCATION NEEDS:   Not appropriate for education at this time  Skin:  Skin Assessment: Reviewed RN Assessment  Last  BM:  7/14 -type 2  Height:   Ht Readings from Last 1 Encounters:  06/29/22 5\' 3"  (1.6 m)    Weight:   Wt Readings from Last 1 Encounters:  06/29/22 56.3 kg    BMI:  Body mass index is 21.99 kg/m.  Estimated Nutritional Needs:   Kcal:  1650-1850  Protein:  80-95g  Fluid:  1.9L/day  07/01/22, MS, RD, LDN Inpatient Clinical Dietitian Contact information available via Amion

## 2022-06-30 NOTE — Plan of Care (Signed)
  Problem: Fluid Volume: Goal: Hemodynamic stability will improve Outcome: Progressing   Problem: Clinical Measurements: Goal: Diagnostic test results will improve Outcome: Progressing Goal: Signs and symptoms of infection will decrease Outcome: Progressing   Problem: Education: Goal: Knowledge of General Education information will improve Description: Including pain rating scale, medication(s)/side effects and non-pharmacologic comfort measures Outcome: Progressing   Problem: Health Behavior/Discharge Planning: Goal: Ability to manage health-related needs will improve Outcome: Progressing   Problem: Clinical Measurements: Goal: Ability to maintain clinical measurements within normal limits will improve Outcome: Progressing Goal: Will remain free from infection Outcome: Progressing Goal: Diagnostic test results will improve Outcome: Progressing Goal: Respiratory complications will improve Outcome: Progressing Goal: Cardiovascular complication will be avoided Outcome: Progressing   Problem: Activity: Goal: Risk for activity intolerance will decrease Outcome: Progressing   Problem: Nutrition: Goal: Adequate nutrition will be maintained Outcome: Progressing   Problem: Coping: Goal: Level of anxiety will decrease Outcome: Progressing   Problem: Elimination: Goal: Will not experience complications related to bowel motility Outcome: Progressing Goal: Will not experience complications related to urinary retention Outcome: Progressing   Problem: Pain Managment: Goal: General experience of comfort will improve Outcome: Progressing   Problem: Safety: Goal: Ability to remain free from injury will improve Outcome: Progressing   Problem: Skin Integrity: Goal: Risk for impaired skin integrity will decrease Outcome: Progressing   

## 2022-06-30 NOTE — Progress Notes (Signed)
Provider made aware of daughter's concern. Patient daughter states that the patient has been exhibiting signs of pain in her left shoulder and is requesting an xray for that left shoulder. Patient is also yelling and combative with staff and care. She is noncompliant with care. Daughter believes her behavior is related to pain, but she is resting and is at peace until staff members have to touch, turn, or care for her in any way. Will continue to monitor.

## 2022-06-30 NOTE — [place] FL2 (Signed)
[place] MEDICAID FL2 LEVEL OF CARE SCREENING TOOL     IDENTIFICATION  Patient Name: Sarah Myers Birthdate: December 08, 1971 Sex: female Admission Date (Current Location): 06/28/2022  [facility] and [place] Number:  Producer, television/film/video and Address:  [facility],  [address] [place], [place] [address]      Provider Number: [id number]  Attending Physician Name and Address:  David Stall, Darin Engels, MD  Relative Name and Phone Number:  Keriana Sarsfield [phone]    Current Level of Care: Hospital Recommended Level of Care: Skilled Nursing Facility Prior Approval Number:    Date Approved/Denied:   PASRR Number: [id number]  Discharge Plan: SNF    Current Diagnoses: Patient Active Problem List   Diagnosis Date Noted   Acute encephalopathy 06/28/2022   Bacteremia 06/28/2022   Metabolic acidosis 06/28/2022   Sepsis due to gram-negative UTI (HCC) 06/24/2022   Essential hypertension 06/24/2022   History of CVA (cerebrovascular accident) 06/24/2022   History of pulmonary embolism 06/24/2022   Anxiety and depression 06/24/2022   Acute metabolic encephalopathy 06/24/2022   Altered mental status 06/23/2022   ICH (intracerebral hemorrhage) (HCC) 04/04/2022   Hypercalcemia 03/21/2022   Oral thrush 03/13/2022   Dysphagia due to recent stroke/moderate [facility] 03/08/2022   Counseling regarding goals of care 03/08/2022   Tracheostomy dependent [facility])    Bilateral pulmonary embolism (HCC)    AKI (acute kidney injury) (HCC)    Acute on chronic respiratory failure with hypoxia (HCC)    Pressure injury of skin 02/07/2022   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/05/2022   Iron deficiency anemia 06/30/2020   Vitamin D deficiency 06/30/2020   GAD (generalized anxiety disorder) 05/18/2020   Malignant hypertension 10/30/2019    Orientation RESPIRATION BLADDER Height & Weight     Self  Normal Incontinent, External catheter Weight: 124 lb 1.9 oz (56.3  kg) Height:  5\' 3"  (160 cm)  BEHAVIORAL SYMPTOMS/MOOD NEUROLOGICAL BOWEL NUTRITION STATUS      Incontinent Diet (Regular)  AMBULATORY STATUS COMMUNICATION OF NEEDS Skin   Total Care Verbally Normal                       Personal Care Assistance Level of Assistance  Bathing, Feeding, Dressing Bathing Assistance: Maximum assistance Feeding assistance: Maximum assistance Dressing Assistance: Maximum assistance     Functional Limitations Info  Sight, Hearing, Speech Sight Info: Adequate Hearing Info: Adequate Speech Info: Adequate    SPECIAL CARE FACTORS FREQUENCY  PT (By licensed PT), OT (By licensed OT)     PT Frequency: 5x/wk OT Frequency: 5x/wk            Contractures Contractures Info: Not present    Additional Factors Info  Code Status, Allergies Code Status Info: FULL Allergies Info: Lisinopril           Current Medications (06/30/2022):  This is the current hospital active medication list Current Facility-Administered Medications  Medication Dose Route Frequency Provider Last Rate Last Admin   acetaminophen (TYLENOL) tablet 650 mg  650 mg Oral Q6H PRN Opyd, 07/02/2022, MD       Or   acetaminophen (TYLENOL) suppository 650 mg  650 mg Rectal Q6H PRN Opyd, Lavone Neri, MD       amantadine (SYMMETREL) capsule 100 mg  100 mg Oral BID Opyd, Lavone Neri, MD   100 mg at 06/28/22 2220   amLODipine (NORVASC) tablet 10 mg  10 mg Oral Daily Opyd, 08/29/22, MD  apixaban (ELIQUIS) tablet 5 mg  5 mg Oral BID Opyd, Lavone Neri, MD   5 mg at 06/29/22 2227   baclofen (LIORESAL) tablet 5-15 mg  5-15 mg Oral BID Briscoe Deutscher, MD   10 mg at 06/28/22 2158   carvedilol (COREG) tablet 6.25 mg  6.25 mg Oral BID WC Opyd, Lavone Neri, MD   6.25 mg at 06/28/22 2158   dextrose 5 % with KCl 20 mEq / L  infusion  20 mEq Intravenous Continuous Marinda Elk, MD       famotidine (PEPCID) tablet 20 mg  20 mg Oral BID PRN Opyd, Lavone Neri, MD       gabapentin (NEURONTIN) capsule  100 mg  100 mg Oral BID Opyd, Lavone Neri, MD   100 mg at 06/28/22 2159   hydrALAZINE (APRESOLINE) injection 5 mg  5 mg Intravenous Q6H PRN Marinda Elk, MD   5 mg at 06/30/22 0506   labetalol (NORMODYNE) injection 10 mg  10 mg Intravenous Q4H PRN Opyd, Lavone Neri, MD   10 mg at 06/29/22 2357   losartan (COZAAR) tablet 25 mg  25 mg Oral Daily Opyd, Lavone Neri, MD       melatonin tablet 3 mg  3 mg Oral QHS Opyd, Lavone Neri, MD   3 mg at 06/29/22 0203   metoprolol tartrate (LOPRESSOR) injection 2.5 mg  2.5 mg Intravenous Q6H PRN Kyere, Belinda K, NP       ondansetron (ZOFRAN) tablet 4 mg  4 mg Oral Q6H PRN Opyd, Lavone Neri, MD       Or   ondansetron (ZOFRAN) injection 4 mg  4 mg Intravenous Q6H PRN Opyd, Lavone Neri, MD       piperacillin-tazobactam (ZOSYN) IVPB 3.375 g  3.375 g Intravenous Q8H Lynden Ang, RPH 12.5 mL/hr at 06/30/22 0503 3.375 g at 06/30/22 0503   podofilox (CONDYLOX) 0.5 % external solution   Topical BID Opyd, Lavone Neri, MD       senna-docusate (Senokot-S) tablet 1 tablet  1 tablet Oral QHS PRN Opyd, Lavone Neri, MD         Discharge Medications: Please see discharge summary for a list of discharge medications.  Relevant Imaging Results:  Relevant Lab Results:   Additional Information SS# [id number]  Otelia Santee, LCSW

## 2022-06-30 NOTE — TOC Initial Note (Signed)
Transition of Care [facility]) - Initial/Assessment Note    Patient Details  Name: Sarah Myers MRN: [id number] Date of Birth: Dec 12, 1971  Transition of [facility]) CM/SW Contact:    Otelia Santee, LCSW Phone Number: 06/30/2022, 12:54 PM  Clinical Narrative:                 Spoke with pt's daughter, Rogenia Werntz and confirmed plan for SNF placement. Pt's daughter is agreeable to this and would like to view SNF facilities prior to making placement decision. CSW has referred patient out for SNF placement and currently awaiting bed offers.   Expected Discharge Plan: Skilled Nursing Facility Barriers to Discharge: Continued Medical Work up   Patient Goals and CMS Choice Patient states their goals for this hospitalization and ongoing recovery are:: To return home   Choice offered to / list presented to : Patient, Adult Children  Expected Discharge Plan and Services Expected Discharge Plan: Skilled Nursing Facility In-house Referral: Clinical Social Work Discharge Planning Services: CM Consult Post Acute Care Choice: Skilled Nursing Facility Living arrangements for the past 2 months: Apartment                 DME Arranged: N/A DME Agency: NA                  Prior Living Arrangements/Services Living arrangements for the past 2 months: Apartment Lives with:: Adult Children Patient language and need for interpreter reviewed:: Yes Do you feel safe going back to the place where you live?: Yes      Need for Family Participation in Patient Care: Yes (Comment) Care giver support system in place?: Yes (comment) Current home services: DME, Home PT, Home OT Criminal Activity/Legal Involvement Pertinent to Current Situation/Hospitalization: No - Comment as needed  Activities of Daily Living Home Assistive Devices/Equipment: Wheelchair, Environmental consultant (specify type), Hospital bed, Bedside commode/3-in-1 (bedpan) ADL Screening (condition at time of admission) Patient's cognitive  ability adequate to safely complete daily activities?: No Is the patient deaf or have difficulty hearing?: No Does the patient have difficulty seeing, even when wearing glasses/contacts?: No Does the patient have difficulty concentrating, remembering, or making decisions?: Yes Patient able to express need for assistance with ADLs?: No Does the patient have difficulty dressing or bathing?: Yes Independently performs ADLs?: No Communication: Needs assistance Is this a change from baseline?: Pre-admission baseline Dressing (OT): Needs assistance Is this a change from baseline?: Pre-admission baseline Grooming: Needs assistance Is this a change from baseline?: Pre-admission baseline Feeding: Needs assistance Is this a change from baseline?: Pre-admission baseline Bathing: Needs assistance Is this a change from baseline?: Pre-admission baseline Toileting: Needs assistance Is this a change from baseline?: Pre-admission baseline In/Out Bed: Needs assistance Is this a change from baseline?: Pre-admission baseline Walks in Home: Dependent Is this a change from baseline?: Pre-admission baseline Does the patient have difficulty walking or climbing stairs?: Yes Weakness of Legs: Both Weakness of Arms/Hands: Both  Permission Sought/Granted Permission sought to share information with : Facility Medical sales representative, Family Supports Permission granted to share information with : Yes, Verbal Permission Granted  Share Information with NAME: Cassy Sprowl     Permission granted to share info w Relationship: Daughter  Permission granted to share info w Contact Information: [phone]  Emotional Assessment Appearance:: Appears older than stated age Attitude/Demeanor/Rapport: Lethargic Affect (typically observed): Sad Orientation: : Oriented to Self Alcohol / Substance Use: Not Applicable Psych Involvement: No (comment)  Admission diagnosis:  Encephalopathy [G93.40] Acute  encephalopathy [G93.40]  Positive blood culture [R78.81] Acute metabolic encephalopathy [G93.41] Patient Active Problem List   Diagnosis Date Noted   Acute encephalopathy 06/28/2022   Bacteremia 06/28/2022   Metabolic acidosis 06/28/2022   Sepsis due to gram-negative UTI (HCC) 06/24/2022   Essential hypertension 06/24/2022   History of CVA (cerebrovascular accident) 06/24/2022   History of pulmonary embolism 06/24/2022   Anxiety and depression 06/24/2022   Acute metabolic encephalopathy 06/24/2022   Altered mental status 06/23/2022   ICH (intracerebral hemorrhage) (HCC) 04/04/2022   Hypercalcemia 03/21/2022   Oral thrush 03/13/2022   Dysphagia due to recent stroke/moderate [facility] 03/08/2022   Counseling regarding goals of care 03/08/2022   Tracheostomy dependent (HCC)    Bilateral pulmonary embolism (HCC)    AKI (acute kidney injury) (HCC)    Acute on chronic respiratory failure with hypoxia (HCC)    Pressure injury of skin 02/07/2022   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 [place] ([place]) [place] [address] Phone: [phone] Fax: [phone]     Social Determinants of Health (SDOH) Interventions    Readmission Risk Interventions    06/30/2022   12:52 PM 03/09/2022    9:54 AM  Readmission Risk Prevention Plan  Transportation Screening Complete Complete  PCP or Specialist Appt within 5-7 Days Complete   PCP or Specialist Appt within 3-5 Days  Complete  Home Care Screening Complete   Medication Review (RN CM) Complete   HRI or Home Care Consult  Not Complete  Social Work Consult for Recovery Care Planning/Counseling  Complete  Medication Review Furniture conservator/restorer)  Complete

## 2022-06-30 NOTE — Evaluation (Signed)
Physical Therapy Evaluation Patient Details Name: Sarah Myers MRN: [id number] DOB: 1971/03/16 Today's Date: 06/30/2022  History of Present Illness  51 y.o. 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 Pt with recent admission and discharged on 06/26/2022 for which she was treated empirically with IV Rocephin for SIRS but no source of infection found.  However, since discharge the patient has continued increased anxiety and restlessness not eating and drinking well 2 out of 2 blood cultures from 06/23/2022 is growing gram-positive rods  Clinical Impression  Pt admitted with above diagnosis.  Pt currently with functional limitations due to the deficits listed below (see PT Problem List). Pt will benefit from skilled PT to increase their independence and safety with mobility to allow discharge to the venue listed below.  Pt assisted with sitting EOB and currently requiring at least max +2 assist for mobility.  Daughter has been assisting pt at home however does not feel able to provide this level of care.  Daughter reports pt was receiving HHPT however they have not been back out to the house since pt had a recent admission.  Daughter hopeful for d/c to SNF.        Recommendations for follow up therapy are one component of a multi-disciplinary discharge planning process, led by the attending physician.  Recommendations may be updated based on patient status, additional functional criteria and insurance authorization.  Follow Up Recommendations Skilled nursing-short term rehab (<3 hours/day) Can patient physically be transported by private vehicle: No    Assistance  Recommended at Discharge Frequent or constant Supervision/Assistance  Patient can return home with the following  A lot of help with walking and/or transfers;A lot of help with bathing/dressing/bathroom;Assistance with feeding;Direct supervision/assist for medications management;Assist for transportation    Equipment Recommendations None recommended by PT  Recommendations for Other Services       Functional Status Assessment Patient has had a recent decline in their functional status and demonstrates the ability to make significant improvements in function in a reasonable and predictable amount of time.     Precautions / Restrictions Precautions Precautions: Fall Precaution Comments: L UE spastic hemiplegia, L LE hemiparesis      Mobility  Bed Mobility Overal bed mobility: Needs Assistance Bed Mobility: Supine to Sit, Sit to Supine     Supine to sit: Max assist, +2 for safety/equipment, HOB elevated Sit to supine: Max assist, +2 for safety/equipment   General bed mobility comments: pt requiring assist for left side and scooting to EOB, assist to return to bed for upper and lower body, daughter present and also assisting    Transfers                   General transfer comment: deferred as pt crying at EOB, talking about spouse who has passed away    Ambulation/Gait                  Stairs            Wheelchair Mobility    Modified Rankin (Stroke Patients Only)       Balance Overall balance assessment: Needs assistance Sitting-balance support: Single extremity supported, Feet supported Sitting balance-Leahy Scale: Fair Sitting  balance - Comments: able to sit EOB without external support however present with left lateral lean with fatigue and requires assist Postural control: Left lateral lean                                   Pertinent Vitals/Pain Pain Assessment Pain Assessment: PAINAD Breathing: normal Negative  Vocalization: none Facial Expression: sad, frightened, frown Body Language: tense, distressed pacing, fidgeting Consolability: distracted or reassured by voice/touch PAINAD Score: 3 Pain Intervention(s): Monitored during session, Repositioned    Home Living   Living Arrangements: Children (daughter) Available Help at Discharge: Family;Available 24 hours/day Type of Home: Apartment         Home Layout: One level Home Equipment: Wheelchair - manual      Prior Function Prior Level of Function : Needs assist       Physical Assist : Mobility (physical) Mobility (physical): Bed mobility;Transfers   Mobility Comments: pt requiring approx mod assist for bed mobility and transfers per daughter's description; pt assisted with transfers to w/c and daughter pushes w/c       Hand Dominance        Extremity/Trunk Assessment   Upper Extremity Assessment Upper Extremity Assessment: LUE deficits/detail LUE Deficits / Details: spastic hemiplegia, in flexion pattern, shoulder does not appear subluxed; xray negative for fracture however pt winces and guarded with touching or assisting L UE    Lower Extremity Assessment Lower Extremity Assessment: Generalized weakness;LLE deficits/detail LLE Deficits / Details: history of ICH with hemiparesis       Communication   Communication: Expressive difficulties  Cognition Arousal/Alertness: Awake/alert Behavior During Therapy: Restless Overall Cognitive Status: Impaired/Different from baseline Area of Impairment: Following commands                       Following Commands: Follows one step commands inconsistently       General Comments: pt emotionally labile today, daughter reports pt's cognition is not at baseline        General Comments      Exercises     Assessment/Plan    PT Assessment Patient needs continued PT services  PT Problem List Decreased strength;Decreased activity tolerance;Decreased  balance;Decreased mobility;Decreased safety awareness;Decreased cognition;Impaired tone;Decreased coordination       PT Treatment Interventions Therapeutic exercise;DME instruction;Balance training;Wheelchair mobility training;Functional mobility training;Cognitive remediation;Therapeutic activities;Patient/family education;Neuromuscular re-education    PT Goals (Current goals can be found in the Care Plan section)  Acute Rehab PT Goals PT Goal Formulation: With family Time For Goal Achievement: 07/14/22 Potential to Achieve Goals: Fair    Frequency Min 2X/week     Co-evaluation               AM-PAC PT "6 Clicks" Mobility  Outcome Measure Help needed turning from your back to your side while in a flat bed without using bedrails?: Total Help needed moving from lying on your back to sitting on the side of a flat bed without using bedrails?: Total Help needed moving to and from a bed to a chair (including a wheelchair)?: Total Help needed standing up from a chair using your arms (e.g., wheelchair or bedside chair)?: Total Help needed to walk in hospital room?: Total Help needed climbing 3-5 steps with a railing? : Total 6 Click Score: 6    End of Session   Activity Tolerance: Patient tolerated treatment well Patient left: in bed;with call bell/phone within reach;with  bed alarm set;with family/visitor present Nurse Communication: Mobility status PT Visit Diagnosis: Other abnormalities of gait and mobility (R26.89);Hemiplegia and hemiparesis Hemiplegia - Right/Left: Left Hemiplegia - caused by: Nontraumatic intracerebral hemorrhage    Time: 1102-1129 PT Time Calculation (min) (ACUTE ONLY): 27 min   Charges:   PT Evaluation $PT Eval Moderate Complexity: 1 Mod PT Treatments $Therapeutic Activity: 8-22 mins       Sarah Myers PT, DPT Physical Therapist Acute Rehabilitation Services Preferred contact method: Secure Chat Weekend Pager Only: [phone] Office:  [phone]   Myrtis Hopping [place] 06/30/2022, 12:04 PM
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
## 2022-06-30 NOTE — Evaluation (Signed)
Clinical/Bedside Swallow Evaluation Patient Details  Name: Sarah Myers MRN: [id number] Date of Birth: Mar 01, 1971  Today's Date: 06/30/2022 Time: SLP Start Time (ACUTE ONLY): 1240 SLP Stop Time (ACUTE ONLY): 1310 SLP Time Calculation (min) (ACUTE ONLY): 30 min  Past Medical History:  Past Medical History:  Diagnosis Date   Hypertension    Past Surgical History:  Past Surgical History:  Procedure Laterality Date   BREAST LUMPECTOMY  1992   CESAREAN SECTION     IR GASTROSTOMY TUBE MOD SED  03/06/2022   ORIF RADIAL FRACTURE  02/10/2012   Procedure: OPEN REDUCTION INTERNAL FIXATION (ORIF) RADIAL FRACTURE;  Surgeon: Cammy Copa, MD;  Location: [facility];  Service: Orthopedics;  Laterality: Right;   ORIF ULNAR FRACTURE  02/10/2012   Procedure: OPEN REDUCTION INTERNAL FIXATION (ORIF) ULNAR FRACTURE;  Surgeon: Cammy Copa, MD;  Location: [facility] OR;  Service: Orthopedics;  Laterality: Right;   TUBAL LIGATION     HPI:  51yo female admitted 06/28/22 with increased anxiety, decreased PO intake, worsening deficits since discharge from CIR in May (DC'd on D3/thin). 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    Assessment / Plan / Recommendation  Clinical Impression  Pt seen at bedside for skilled ST intervention targeting evaluation of swallow function and safety for identification of PO diet. Pt was awake, but struggled to follow commands consistently. Voice quality was clear, but low in volume. Volitional cough was weak. CN exam grossly WFL. Oral care was completed with suction. Pt noted to have a hyperactive gag reflex. Following oral care, pt accepted trials of ice chips, thin liquid, nectar thick liquid, and solid textures. Pt spit out puree trials (applesauce), and verbalized that she did not like pudding or ice cream. Ice chips were  tolerated well, without cough response. Immediate cough noted following trials of thin liquid. Pt took large consecutive boluses of thin liquid on the first trial, due to her holding the cup and not allowing SLP to move it. The second trial, SLP allowed only a small individual sip, and an immediate cough response was still elicited. Trials of nectar thick liquid were tolerated without cough response. Small bites of graham cracker were tolerated, however, oral prep was extended and slight oral residue was noted. Liquid wash with nectar thick liquids was effective to remove residue. Pt tends to be impulsive with eating/drinking.  At this time, recommend Dys2 diet with nectar thick liquids. Meds crushed. Safe swallow precautions posted at [facility] and discussed with pt's daughter. SLP will follow to assess diet tolerance, readiness to advance textures, and/or completion of instrumental evaluation. RN and MD informed.  SLP Visit Diagnosis: Dysphagia, unspecified (R13.10)    Aspiration Risk  Mild aspiration risk;Risk for inadequate nutrition/hydration    Diet Recommendation Dysphagia 2 (Fine chop);Nectar-thick liquid   Liquid Administration via: Straw Medication Administration: Crushed with puree Supervision: Staff to assist with self feeding;Full supervision/cueing for compensatory strategies Compensations: Minimize environmental distractions;Small sips/bites;Slow rate;Follow solids with liquid Postural Changes: Seated upright at 90 degrees;Remain upright for at least 30 minutes after po intake    Other  Recommendations Oral Care Recommendations: Oral care BID Other Recommendations: Order thickener from pharmacy;Have oral suction available    Recommendations for follow up therapy are one component of a multi-disciplinary discharge planning process, led by the attending physician.  Recommendations may be updated based on patient status, additional functional criteria and insurance authorization.  Follow  up Recommendations Follow physician's recommendations for discharge plan and follow up therapies      Assistance Recommended at Discharge Frequent or constant Supervision/Assistance  Functional Status Assessment Patient has had a recent decline in their functional status and/or demonstrates limited ability to make significant improvements in function in a reasonable and predictable amount of time  Frequency and Duration min 1 x/week  1 week;2 weeks       Prognosis Prognosis for Safe Diet Advancement: Fair Barriers to Reach Goals: Cognitive deficits      Swallow Study   General Date of Onset: 06/28/22 HPI: 51yo female admitted 06/28/22 with increased anxiety, decreased PO intake, worsening deficits since discharge from CIR in May (DC'd on D3/thin). 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 Type of Study: Bedside Swallow Evaluation Previous Swallow Assessment: pt followed by SLP @ CIR - DC'd 04/25/22 on Dys3/thin Diet Prior to this Study: NPO Temperature Spikes Noted: No Respiratory Status: Room air History of Recent Intubation: No (9-day intubation in February 2023) Behavior/Cognition: Alert;Doesn't follow directions;Requires cueing Oral Cavity Assessment: Within Functional Limits Oral Care Completed by SLP: Yes Oral Cavity - Dentition: Missing dentition Vision: Functional for self-feeding Self-Feeding Abilities: Able to feed self;Needs assist;Needs set up Patient Positioning: Upright in bed Baseline Vocal Quality: Low vocal intensity Volitional Cough: Weak Volitional Swallow: Unable to elicit    Oral/Motor/Sensory Function Overall Oral Motor/Sensory Function: Mild impairment   Ice Chips Ice chips: Within functional limits Presentation: Spoon   Thin Liquid Thin Liquid: Impaired Presentation: Straw Pharyngeal  Phase Impairments:  Cough - Immediate    Nectar Thick Nectar Thick Liquid: Within functional limits Presentation: Straw   Honey Thick Honey Thick Liquid: Not tested   Puree Other Comments: Pt spit out presentations of puree texture   Solid     Solid: Impaired Presentation: Self Fed Oral Phase Functional Implications: Prolonged oral transit;Impaired mastication;Oral residue     Jemario Poitras B. Murvin Natal, [facility], CCC-SLP Speech Language Pathologist Office: [phone]  Leigh Aurora 06/30/2022,1:23 PM
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
## 2022-07-01 DIAGNOSIS — G9340 Encephalopathy, unspecified: Secondary | ICD-10-CM | POA: Diagnosis not present

## 2022-07-01 LAB — GLUCOSE, CAPILLARY: Glucose-Capillary: 128 mg/dL — ABNORMAL HIGH (ref 70–99)

## 2022-07-01 MED ORDER — HYDRALAZINE HCL 20 MG/ML IJ SOLN
10.0000 mg | Freq: Four times a day (QID) | INTRAMUSCULAR | Status: DC | PRN
  Administered 2022-07-01 – 2022-07-02 (×3): 10 mg via INTRAVENOUS
  Filled 2022-07-01 (×3): qty 1

## 2022-07-01 MED ORDER — ENOXAPARIN SODIUM 60 MG/0.6ML IJ SOSY: 1.0000 mg/kg | PREFILLED_SYRINGE | Freq: Two times a day (BID) | INTRAMUSCULAR | Status: DC

## 2022-07-01 MED ORDER — LABETALOL HCL 5 MG/ML IV SOLN: 20.0000 mg | INTRAVENOUS | Status: DC | PRN

## 2022-07-01 MED ORDER — APIXABAN 5 MG PO TABS
5.0000 mg | ORAL_TABLET | Freq: Two times a day (BID) | ORAL | Status: DC
  Filled 2022-07-01 (×2): qty 1

## 2022-07-01 MED ORDER — ENOXAPARIN SODIUM 60 MG/0.6ML IJ SOSY: 60.0000 mg | PREFILLED_SYRINGE | Freq: Two times a day (BID) | INTRAMUSCULAR | Status: DC

## 2022-07-01 MED ORDER — LORAZEPAM 2 MG/ML IJ SOLN
0.5000 mg | Freq: Once | INTRAMUSCULAR | Status: AC
  Administered 2022-07-01: 0.5 mg via INTRAVENOUS
  Filled 2022-07-01: qty 1

## 2022-07-01 MED ORDER — ENOXAPARIN SODIUM 60 MG/0.6ML IJ SOSY
1.0000 mg/kg | PREFILLED_SYRINGE | INTRAMUSCULAR | Status: AC
Start: 2022-07-01 — End: 2022-07-01
  Administered 2022-07-01: 57.5 mg via SUBCUTANEOUS
  Filled 2022-07-01: qty 0.6

## 2022-07-01 MED ORDER — POTASSIUM CL IN DEXTROSE 5% 20 MEQ/L IV SOLN
20.0000 meq | INTRAVENOUS | Status: DC
  Administered 2022-07-01 – 2022-07-02 (×2): 20 meq via INTRAVENOUS
  Filled 2022-07-01 (×2): qty 1000

## 2022-07-01 NOTE — Progress Notes (Signed)
This RN attempted multiple times to give crushed PO meds with apple sauce, Patient refused to swallow the meds and continuously  spits the meds. PRN Labetalol given d/t high BP.

## 2022-07-01 NOTE — Progress Notes (Signed)
A consult was placed to the hospital's IV Nurse for a 2nd iv site due to incompatible meds/fluids; limited to right arm only for all labs, ivs;  pt becomes agitated very easily, moving around; right arm assessed with ultrasound; very poor veins noted; has a small brachial vein just above her current iv site (same vein); pt not able to lie perfectly still to place a midline; was able to place a small 22ga in the right hand; mitten placed; forearm is wrapped to prevent the pt from pulling that iv line out;  family is at bedside;  pt with very limited access.

## 2022-07-01 NOTE — Progress Notes (Signed)
Per pts daughter at bedside, pt likes to take her meds in applesauce or coca cola. Pt alert & follows commands. RN crushed meds and attempted to administer meds in apple sauce. Pt spits out the applesauce containing the meds and refused. On call provider notified & aware. Will continue to monitor.

## 2022-07-01 NOTE — TOC Progression Note (Signed)
Transition of Care [facility]) - Progression Note    Patient Details  Name: Sarah Myers MRN: [id number] Date of Birth: May 02, 1971  [facility] CM/SW Contact  Otelia Santee, LCSW Phone Number: 07/01/2022, 1:54 PM  Clinical Narrative:    Spoke with pt's daughter to review bed offer for SNF. Pt's daughter declined SNF offer for [facility]. Daughter is to view [facility] and is to contact CSW with decision.    Expected Discharge Plan: Skilled Nursing Facility Barriers to Discharge: Continued Medical Work up  Expected Discharge Plan and Services Expected Discharge Plan: Skilled Nursing Facility In-house Referral: Clinical Social Work Discharge Planning Services: CM Consult Post Acute Care Choice: Skilled Nursing Facility Living arrangements for the past 2 months: Apartment                 DME Arranged: N/A DME Agency: NA                   Social Determinants of Health (SDOH) Interventions    Readmission Risk Interventions    06/30/2022   12:52 PM 03/09/2022    9:54 AM  Readmission Risk Prevention Plan  Transportation Screening Complete Complete  PCP or Specialist Appt within 5-7 Days Complete   PCP or Specialist Appt within 3-5 Days  Complete  Home Care Screening Complete   Medication Review (RN CM) Complete   HRI or Home Care Consult  Not Complete  Social Work Consult for Recovery Care Planning/Counseling  Complete  Medication Review [employer])  Complete

## 2022-07-01 NOTE — Progress Notes (Addendum)
[facility] PROGRESS NOTE    Progress Note  Sarah Myers  HGD:[id number] DOB: 03-04-1971 DOA: 06/28/2022 PCP: Shayne Alken, MD     Brief Narrative:   Sarah Myers is an 51 y.o. female past medical history significant for nontraumatic intracranial hemorrhage, with spastic hemiplegia and cognitive deficits, bilateral PE in March of this year on Eliquis, essential hypertension now comes in to the ED for increased anxiety and decreased oral intake and increased worsening deficits since discharge from inpatient rehab in April, she was recently discharged on 06/26/2022 for which she was treated empirically with IV Rocephin for SIRS but no source of infection found.  Right since discharge the patient has continued increased anxiety and restlessness not eating and drinking well 2 out of 2 blood cultures from 06/23/2022 is growing gram-positive rods  Awaiting skilled nursing select placement. Assessment/Plan:   Acute metabolic encephalopathy According to the daughter the patient has had poor oral intake. CT of the head showed no acute findings. More awake today, she relates she is significantly thirsty. This likely due to infectious etiology. Awaiting skilled nursing facility placement  Gram-positive rod bacteremia: On 06/23/2022 blood cultures were positive for gram-positive rods 2 out of 2 surveillance blood cultures, surveillance blood cultures have been negative till date. ID was consulted recommended to switch to oral amoxicillin to complete a 10-day course as an outpatient.  They have signed off.  History of intracranial hemorrhage: With residual spastic hemiplegia and cognitive deficit. CT of the head showed no acute findings. Patient daughter relates she is unable to care for her mom despite home health services she would like skilled nursing facility placement. Physical therapy recommended skilled nursing facility placement.  Hypercalcemia: Likely due to  dehydration continue with fluid resuscitation.  History of PE: Resume Eliquis.  Anxiety: Continue Zoloft and hydroxyzine.  Central hypertension: continue current regimen.  Normal anion gap metabolic acidosis: Resolving with fluid resuscitation.  Hypokalemia: Replete orally recheck in the morning.   DVT prophylaxis: lovenox Family Communication:daughter Status is: Observation The patient will require care spanning > 2 midnights and should be moved to inpatient because: Acute metabolic encephalopathy probably due to bacteremia    Code Status:     Code Status Orders  (From admission, onward)           Start     Ordered   06/28/22 1953  Full code  Continuous        06/28/22 1953           Code Status History     Date Active Date Inactive Code Status Order ID Comments User Context   06/23/2022 2206 06/26/2022 1812 Full Code [id number]  Mansy, Vernetta Honey, MD ED   04/04/2022 1526 04/26/2022 1812 Full Code [id number]  Milinda Antis, PA-C Inpatient   03/22/2022 0523 04/04/2022 1517 Full Code [id number]  Leane Para, CNA Inpatient   02/05/2022 0849 03/22/2022 0350 Full Code [id number]  Jefferson Fuel, MD ED   02/10/2012 1504 02/11/2012 1448 Full Code [id number]  Lorenso Quarry H Inpatient         IV Access:   Peripheral IV   Procedures and diagnostic studies:   DG Shoulder 1V Left  Result Date: 06/30/2022 CLINICAL DATA:  Left shoulder pain EXAM: LEFT SHOULDER COMPARISON:  06/28/2022 FINDINGS: Limited single view exam. No gross malalignment, acute osseous finding, fracture, or arthropathy. AC joint also aligned. Included chest unremarkable. IMPRESSION: No acute abnormality Electronically Signed   By: Judie Petit.  Shick M.D.  On: 06/30/2022 08:04     Medical Consultants:   None.   Subjective:    Sarah Myers no complaints this morning  Objective:    Vitals:   06/30/22 1046 06/30/22 1315 06/30/22 1947 07/01/22 0325  BP: (!) 170/90 (!) 175/96 (!) 157/97 (!) 144/83   Pulse: 93 90 90 83  Resp:  (!) 22 16 16   Temp:  98.8 F (37.1 C) 98.3 F (36.8 C) 97.6 F (36.4 C)  TempSrc:  Oral Oral Oral  SpO2:  100% 100% 99%  Weight:      Height:       SpO2: 99 %   Intake/Output Summary (Last 24 hours) at 07/01/2022 0837 Last data filed at 06/30/2022 1700 Gross per 24 hour  Intake 120 ml  Output --  Net 120 ml    Filed Weights   06/29/22 1711  Weight: 56.3 kg    Exam: General exam: In no acute distress. Respiratory system: Good air movement and clear to auscultation. Cardiovascular system: S1 & S2 heard, RRR. No JVD. Gastrointestinal system: Abdomen is nondistended, soft and nontender.  Extremities: No pedal edema. Skin: No rashes, lesions or ulcers Psychiatry: Judgement and insight appear normal. Mood & affect appropriate. Data Reviewed:    Labs: Basic Metabolic Panel: Recent Labs  Lab 06/24/22 1413 06/26/22 0603 06/28/22 1615 06/29/22 0528 06/30/22 1022  NA 145 142 145 145 147*  K 3.4* 3.1* 3.6 3.0* 3.3*  CL 112* 112* 118* 115* 120*  CO2 22 19* 14* 19* 18*  GLUCOSE 97 78 104* 125* 115*  BUN 15 7 9 9 7   CREATININE 1.02* 0.71 0.82 0.68 0.79  CALCIUM 11.4* 10.3 11.8* 10.9* 11.1*  MG  --  1.7  --   --   --     GFR Estimated Creatinine Clearance: 70.4 mL/min (by C-G formula based on SCr of 0.79 mg/dL). Liver Function Tests: Recent Labs  Lab 06/24/22 1413 06/26/22 0603 06/28/22 1615  AST 23 22 24   ALT 20 22 25   ALKPHOS 66 63 72  BILITOT 0.9 0.8 0.9  PROT 7.0 6.8 7.7  ALBUMIN 3.7 3.6 4.1    No results for input(s): "LIPASE", "AMYLASE" in the last 168 hours. Recent Labs  Lab 06/29/22 0645  AMMONIA 25    Coagulation profile No results for input(s): "INR", "PROTIME" in the last 168 hours.  COVID-19 Labs  No results for input(s): "DDIMER", "FERRITIN", "LDH", "CRP" in the last 72 hours.  Lab Results  Component Value Date   SARSCOV2NAA NEGATIVE 06/23/2022   [place] NEGATIVE 02/05/2022    CBC: Recent Labs   Lab 06/24/22 1413 06/26/22 0603 06/28/22 1615 06/29/22 0528  WBC 6.3 4.9 7.3 6.8  NEUTROABS  --   --  4.7  --   HGB 11.4* 12.3 13.1 12.5  HCT 35.1* 37.8 39.2 35.8*  MCV 85.8 86.5 83.9 81.9  PLT 163 144* 196 178    Cardiac Enzymes: No results for input(s): "CKTOTAL", "CKMB", "CKMBINDEX", "TROPONINI" in the last 168 hours. BNP (last 3 results) No results for input(s): "PROBNP" in the last 8760 hours. CBG: No results for input(s): "GLUCAP" in the last 168 hours. D-Dimer: No results for input(s): "DDIMER" in the last 72 hours. Hgb A1c: No results for input(s): "HGBA1C" in the last 72 hours. Lipid Profile: No results for input(s): "CHOL", "HDL", "LDLCALC", "TRIG", "CHOLHDL", "LDLDIRECT" in the last 72 hours. Thyroid function studies: Recent Labs    06/29/22 0528  TSH 1.252    Anemia work up: Recent  Labs    06/29/22 0528  VITAMINB12 379    Sepsis Labs: Recent Labs  Lab 06/24/22 1413 06/26/22 0603 06/28/22 1615 06/28/22 1827 06/28/22 2221 06/29/22 0528  PROCALCITON  --   --   --   --  <0.10  --   WBC 6.3 4.9 7.3  --   --  6.8  LATICACIDVEN 1.8  --  0.7 0.7  --   --     Microbiology Recent Results (from the past 240 hour(s))  Urine Culture     Status: Abnormal   Collection Time: 06/23/22  7:49 PM   Specimen: Urine, Clean Catch  Result Value Ref Range Status   Specimen Description   Final    URINE, CLEAN CATCH Performed at [facility], [address], [place], [place] [address]    Special Requests   Final    NONE Performed at [facility], [address] [place], [place] [address]    Culture (A)  Final    >=100,000 COLONIES/mL MULTIPLE SPECIES PRESENT, SUGGEST RECOLLECTION   Report Status 06/25/2022 FINAL  Final  Culture, blood (routine x 2)     Status: None   Collection Time: 06/23/22  8:55 PM   Specimen: BLOOD  Result Value Ref Range Status   Specimen Description   Final    BLOOD RIGHT ANTECUBITAL Performed  at [facility], [address] [place], [place] [address]    Special Requests   Final    BOTTLES DRAWN AEROBIC AND ANAEROBIC Blood Culture adequate volume Performed at [facility], [address], [place], [place] [address]    Culture   Final    NO GROWTH 5 DAYS Performed at [facility], [address], [place], [place] [address]    Report Status 06/29/2022 FINAL  Final  Culture, blood (routine x 2)     Status: Abnormal   Collection Time: 06/23/22  9:20 PM   Specimen: BLOOD  Result Value Ref Range Status   Specimen Description   Final    BLOOD BLOOD RIGHT HAND Performed at [facility], [address] [place], [place] [address]    Special Requests   Final    BOTTLES DRAWN AEROBIC AND ANAEROBIC Blood Culture adequate volume Performed at [facility], [address], [place], [place] [address]    Culture  Setup Time   Final    GRAM POSITIVE RODS ANAEROBIC BOTTLE ONLY CRITICAL RESULT CALLED TO, READ BACK BY AND VERIFIED WITH: PHARMD CRYSTAL ROBERTSON ON 06/28/22 @ 1610 BY DRT    Culture (A)  Final    PROPIONIBACTERIUM ACNES Standardized susceptibility testing for this organism is not available. Performed at [facility], [address], [place], [place] [address]    Report Status 06/30/2022 FINAL  Final  SARS Coronavirus 2 by RT PCR (hospital order, performed in [facility] hospital lab) *cepheid single result test* Anterior Nasal Swab     Status: None   Collection Time: 06/23/22  9:34 PM   Specimen: Anterior Nasal Swab  Result Value Ref Range Status   SARS Coronavirus 2 by RT PCR NEGATIVE NEGATIVE Final    Comment: (NOTE) SARS-CoV-2 target nucleic acids are NOT DETECTED.  The SARS-CoV-2 RNA is generally detectable in upper and lower respiratory specimens during the acute phase of infection. The lowest concentration of SARS-CoV-2 viral copies this assay can detect is 250 copies / mL.  A negative result does not preclude  SARS-CoV-2 infection and should not be used as the sole basis for treatment or other patient management decisions.  A negative result may occur with improper specimen collection / handling, submission of specimen other than nasopharyngeal swab, presence of viral mutation(s) within the areas targeted by this assay, and inadequate number of viral copies (<250 copies / mL). A negative result must be combined with clinical observations, patient history, and epidemiological information.  Fact Sheet for Patients:   RoadLapTop.co.za  Fact Sheet for Healthcare Providers: http://kim-miller.com/  This test is not yet approved or  cleared by the [place] FDA and has been authorized for detection and/or diagnosis of SARS-CoV-2 by FDA under an Emergency Use Authorization (EUA).  This EUA will remain in effect (meaning this test can be used) for the duration of the COVID-19 declaration under Section 564(b)(1) of the Act, 21 U.S.C. section 360bbb-3(b)(1), unless the authorization is terminated or revoked sooner.  Performed at [facility], [address], [place], [place] [address]   Blood Culture (routine x 2)     Status: None (Preliminary result)   Collection Time: 06/28/22  4:15 PM   Specimen: BLOOD  Result Value Ref Range Status   Specimen Description   Final    BLOOD SITE NOT SPECIFIED Performed at [facility], [address], [place], [place] [address]    Special Requests   Final    BOTTLES DRAWN AEROBIC ONLY Blood Culture adequate volume Performed at [facility], [address] [place], [place] [address]    Culture   Final    NO GROWTH 3 DAYS Performed at [facility], [address], [place], [place] [address]    Report Status PENDING  Incomplete  Blood Culture (routine x 2)     Status: None (Preliminary result)   Collection Time: 06/28/22   6:27 PM   Specimen: BLOOD  Result Value Ref Range Status   Specimen Description   Final    BLOOD RIGHT ANTECUBITAL Performed at [facility], [address], [place], [place] [address]    Special Requests   Final    BOTTLES DRAWN AEROBIC ONLY Blood Culture adequate volume Performed at [facility], [address], [place], [place] [address]    Culture   Final    NO GROWTH 3 DAYS Performed at [facility], [address], [place], [place] [address]    Report Status PENDING  Incomplete  Urine Culture     Status: None   Collection Time: 06/28/22  6:27 PM   Specimen: In/Out Cath Urine  Result Value Ref Range Status   Specimen Description   Final    IN/OUT CATH URINE Performed at [facility], [address], [place], [place] [address]    Special Requests   Final    NONE Performed at [facility], [address], [place], [place] [address]    Culture   Final    NO GROWTH Performed at [facility], [address] [place], [place] [address]    Report Status 06/29/2022 FINAL  Final     Medications:    amantadine  100 mg Oral BID   amLODipine  10 mg Oral Daily   baclofen  5-15 mg Oral BID   carvedilol  6.25 mg Oral BID WC   enoxaparin (LOVENOX) injection  60 mg Subcutaneous Q12H   feeding supplement  1 Container Oral TID BM  food thickener  1 packet Oral TID WC & HS   gabapentin  100 mg Oral BID   losartan  25 mg Oral Daily   melatonin  3 mg Oral QHS   multivitamin with minerals  1 tablet Oral Daily   podofilox   Topical BID   Continuous Infusions:  dextrose 5 % with KCl 20 mEq / L 20 mEq (07/01/22 0629)   penicillin G potassium 12 Million Units in dextrose 5 % 500 mL continuous infusion 12 Million Units (06/30/22 2209)      LOS: 2 days   Charlynne Cousins  [facility]  07/01/2022, 8:37 AM

## 2022-07-01 NOTE — Progress Notes (Signed)
Patient refusing PO meds and intake, MD notified.

## 2022-07-01 NOTE — Progress Notes (Signed)
ANTICOAGULATION CONSULT NOTE - Initial Consult  Pharmacy Consult for enoxaparin Indication: pulmonary embolus  Allergies  Allergen Reactions   Lisinopril Cough    Patient Measurements: Height: 5\' 3"  (160 cm) Weight: 56.3 kg (124 lb 1.9 oz) IBW/kg (Calculated) : 52.4 Heparin Dosing Weight:   Vital Signs: Temp: 98.3 F (36.8 C) (07/14 1947) Temp Source: Oral (07/14 1947) BP: 157/97 (07/14 1947) Pulse Rate: 90 (07/14 1947)  Labs: Recent Labs    06/28/22 1615 06/29/22 0528 06/30/22 1022  HGB 13.1 12.5  --   HCT 39.2 35.8*  --   PLT 196 178  --   CREATININE 0.82 0.68 0.79    Estimated Creatinine Clearance: 70.4 mL/min (by C-G formula based on SCr of 0.79 mg/dL).   Medical History: Past Medical History:  Diagnosis Date   Hypertension     Assessment: 51 yo female with history or bilateral PE in March of this year on eliquis.  Pt refusing to take eliquis therefore pharmacy consulted to dose enoxaparin  LD 7/13 @ 2227 CBC WNL   Goal of Therapy:  Anti-Xa level 0.6-1 units/ml 4hrs after LMWH dose given    Plan:  Enoxaparin 1gm/kg SQ q12h CBC q72h F/u when pt can resume eliquis  2228 RPh 07/01/2022, 1:53 AM

## 2022-07-01 NOTE — Progress Notes (Signed)
Pt BP taken manually 190/101 Md notified, prn's given.

## 2022-07-02 ENCOUNTER — Inpatient Hospital Stay (HOSPITAL_COMMUNITY): Payer: Commercial Managed Care - HMO

## 2022-07-02 DIAGNOSIS — R7881 Bacteremia: Secondary | ICD-10-CM | POA: Diagnosis not present

## 2022-07-02 DIAGNOSIS — Z515 Encounter for palliative care: Secondary | ICD-10-CM

## 2022-07-02 DIAGNOSIS — G9340 Encephalopathy, unspecified: Secondary | ICD-10-CM | POA: Diagnosis not present

## 2022-07-02 DIAGNOSIS — G9341 Metabolic encephalopathy: Secondary | ICD-10-CM | POA: Diagnosis not present

## 2022-07-02 DIAGNOSIS — R531 Weakness: Secondary | ICD-10-CM | POA: Diagnosis not present

## 2022-07-02 DIAGNOSIS — Z7189 Other specified counseling: Secondary | ICD-10-CM

## 2022-07-02 LAB — CBC WITH DIFFERENTIAL/PLATELET
Abs Immature Granulocytes: 0.05 10*3/uL (ref 0.00–0.07)
Basophils Absolute: 0 10*3/uL (ref 0.0–0.1)
Basophils Relative: 0 %
Eosinophils Absolute: 0 10*3/uL (ref 0.0–0.5)
Eosinophils Relative: 0 %
HCT: 36.7 % (ref 36.0–46.0)
Hemoglobin: 13.1 g/dL (ref 12.0–15.0)
Immature Granulocytes: 0 %
Lymphocytes Relative: 13 %
Lymphs Abs: 1.5 10*3/uL (ref 0.7–4.0)
MCH: 28.1 pg (ref 26.0–34.0)
MCHC: 35.7 g/dL (ref 30.0–36.0)
MCV: 78.6 fL — ABNORMAL LOW (ref 80.0–100.0)
Monocytes Absolute: 0.6 10*3/uL (ref 0.1–1.0)
Monocytes Relative: 6 %
Neutro Abs: 9.4 10*3/uL — ABNORMAL HIGH (ref 1.7–7.7)
Neutrophils Relative %: 81 %
Platelets: 205 10*3/uL (ref 150–400)
RBC: 4.67 MIL/uL (ref 3.87–5.11)
RDW: 12.6 % (ref 11.5–15.5)
WBC: 11.6 10*3/uL — ABNORMAL HIGH (ref 4.0–10.5)
nRBC: 0 % (ref 0.0–0.2)

## 2022-07-02 LAB — BASIC METABOLIC PANEL
Anion gap: 7 (ref 5–15)
BUN: 7 mg/dL (ref 6–20)
CO2: 20 mmol/L — ABNORMAL LOW (ref 22–32)
Calcium: 10.4 mg/dL — ABNORMAL HIGH (ref 8.9–10.3)
Chloride: 104 mmol/L (ref 98–111)
Creatinine, Ser: 0.64 mg/dL (ref 0.44–1.00)
GFR, Estimated: >60 mL/min (ref >60)
Glucose, Bld: 162 mg/dL — ABNORMAL HIGH (ref 70–99)
Potassium: 3.9 mmol/L (ref 3.5–5.1)
Sodium: 131 mmol/L — ABNORMAL LOW (ref 135–145)

## 2022-07-02 MED ORDER — PANTOPRAZOLE SODIUM 40 MG IV SOLR
40.0000 mg | Freq: Two times a day (BID) | INTRAVENOUS | Status: DC
  Administered 2022-07-02 – 2022-07-05 (×7): 40 mg via INTRAVENOUS
  Filled 2022-07-02 (×7): qty 10

## 2022-07-02 MED ORDER — POTASSIUM CL IN DEXTROSE 5% 20 MEQ/L IV SOLN
20.0000 meq | INTRAVENOUS | Status: DC
  Administered 2022-07-02: 20 meq via INTRAVENOUS
  Filled 2022-07-02 (×2): qty 1000

## 2022-07-02 MED ORDER — HYDROMORPHONE HCL 1 MG/ML IJ SOLN
0.5000 mg | INTRAMUSCULAR | Status: DC | PRN
  Administered 2022-07-03 – 2022-07-05 (×4): 0.5 mg via INTRAVENOUS
  Filled 2022-07-02 (×4): qty 0.5

## 2022-07-02 MED ORDER — ENOXAPARIN SODIUM 60 MG/0.6ML IJ SOSY
60.0000 mg | PREFILLED_SYRINGE | Freq: Two times a day (BID) | INTRAMUSCULAR | Status: DC
Start: 2022-07-02 — End: 2022-07-02

## 2022-07-02 MED ORDER — ENOXAPARIN SODIUM 60 MG/0.6ML IJ SOSY
1.0000 mg/kg | PREFILLED_SYRINGE | Freq: Two times a day (BID) | INTRAMUSCULAR | Status: DC
Start: 2022-07-02 — End: 2022-07-05
  Administered 2022-07-02 – 2022-07-04 (×5): 57.5 mg via SUBCUTANEOUS
  Filled 2022-07-02 (×5): qty 0.6

## 2022-07-02 MED ORDER — POTASSIUM CL IN DEXTROSE 5% 20 MEQ/L IV SOLN
20.0000 meq | INTRAVENOUS | Status: AC
  Administered 2022-07-02 – 2022-07-03 (×2): 20 meq via INTRAVENOUS
  Filled 2022-07-02: qty 1000

## 2022-07-02 MED ORDER — FLUCONAZOLE 100MG IVPB
100.0000 mg | INTRAVENOUS | Status: DC
  Administered 2022-07-03: 100 mg via INTRAVENOUS
  Filled 2022-07-02 (×3): qty 50

## 2022-07-02 MED ORDER — FLUCONAZOLE IN SODIUM CHLORIDE 200-0.9 MG/100ML-% IV SOLN
200.0000 mg | Freq: Once | INTRAVENOUS | Status: AC
  Administered 2022-07-02: 200 mg via INTRAVENOUS
  Filled 2022-07-02: qty 100

## 2022-07-02 MED ORDER — HALOPERIDOL LACTATE 5 MG/ML IJ SOLN
2.0000 mg | Freq: Four times a day (QID) | INTRAMUSCULAR | Status: DC | PRN
  Administered 2022-07-02 – 2022-07-04 (×4): 2 mg via INTRAVENOUS
  Filled 2022-07-02 (×4): qty 1

## 2022-07-02 NOTE — Progress Notes (Signed)
   07/02/22 0613 07/02/22 0630  Assess: MEWS Score  Temp (!) 100.6 F (38.1 C) 99.8 F (37.7 C)  BP (!) 170/117 (!) 170/101  MAP (mmHg) 131 120  Pulse Rate (!) 107 96  Resp 19 16  Level of Consciousness Alert Alert  SpO2 99 % 97 %  O2 Device Room Air Room Air  Patient Activity (if Appropriate) In bed In bed  Assess: MEWS Score  MEWS Temp 1 0  MEWS Systolic 0 0  MEWS Pulse 1 0  MEWS RR 0 0  MEWS LOC 0 0  MEWS Score 2 0  MEWS Score Color Yellow Green  Assess: if the MEWS score is Yellow or Red  Were vital signs taken at a resting state? Yes  --   Focused Assessment Change from prior assessment (see assessment flowsheet)  --   Does the patient meet 2 or more of the SIRS criteria? Yes  --   Does the patient have a confirmed or suspected source of infection? Yes  --   Provider and Rapid Response Notified? Yes  --   MEWS guidelines implemented *See Row Information* Yes  --   Take Vital Signs  Increase Vital Sign Frequency  Yellow: Q 2hr X 2 then Q 4hr X 2, if remains yellow, continue Q 4hrs  --   Escalate  MEWS: Escalate Yellow: discuss with charge nurse/RN and consider discussing with provider and RRT  --   Notify: Charge Nurse/RN  Name of Charge Nurse/RN Notified Rupinder, RN  --   Date Charge Nurse/RN Notified 07/02/22  --   Time Charge Nurse/RN Notified 0615  --   Notify: Provider  Provider Name/Title Kakrakandy,MD  --   Date Provider Notified 07/02/22  --   Time Provider Notified [phone]  --   Method of Notification Page  --   Notification Reason  (yellow MEWS)  --   Document  Patient Outcome Other (Comment) (Hydralazine given.)  --   Progress note created (see row info) Yes  --   Assess: SIRS CRITERIA  SIRS Temperature  0 0  SIRS Pulse 1 1  SIRS Respirations  0 0  SIRS WBC 0 0  SIRS Score Sum  1 1   PRN Hydralazine given. Dr. Toniann Fail notified via [facility] communication.

## 2022-07-02 NOTE — Significant Event (Signed)
Patient's nurse notified me that patient is not taking many of her oral medication including Eliquis.  Since patient has history of PE and patient not taking Eliquis I discussed with pharmacy and at this time we are switching back to Lovenox full dose for PE and holding Eliquis until patient can reliably take oral medications.  Sarah Myers

## 2022-07-02 NOTE — Consult Note (Signed)
Consultation Note Date: 07/02/2022   Patient Name: Sarah Myers  DOB: 11-Jun-1971  MRN: [id number]  Age / Sex: 51 y.o., female  PCP: Rosaria Ferries, MD Referring Physician: Charlynne Cousins, MD  Reason for Consultation: Establishing goals of care  HPI/Patient Profile: 51 y.o. female   admitted on 06/28/2022    Clinical Assessment and Goals of Care:  51 yo with history of ICH, (was seen by PMT in goals of care conversation in March 2023 and family had endorsed full code, full scope care at that time), patient with spastic hemiplegia and cognitive deficits bilateral PE, HTN.  Daughter Sarah Myers is her primary caregiver.  Patient remains admitted to hospital medicine service with acute metabolic encephalopathy, dysphagia, gram positive rod bacteremia.  Hospital course is such that patient remains confused, no meaningful PO intake.  A palliative consult has been requested for ongoing goals of care discussions.  Palliative medicine is specialized medical care for people living with serious illness. It focuses on providing relief from the symptoms and stress of a serious illness. The goal is to improve quality of life for both the patient and the family. Goals of care: Broad aims of medical therapy in relation to the patient's values and preferences. Our aim is to provide medical care aimed at enabling patients to achieve the goals that matter most to them, given the circumstances of their particular medical situation and their constraints.   I met with the patient's daughter and brother at bedside. Appreciate bedside RN assistance as well. Patient is  confused and does not participate in goals of care discussions. I discussed with the patient's daughter and brother about the patient's current condition and her underlying decline and low functional status. Patient remains at high risk for decline and  decompensation, monitor hospital course so as to assess need for further goals of care discussions.   NEXT OF KIN  Daughter Sarah Myers at bedside.   SUMMARY OF RECOMMENDATIONS    Full Code, Full Scope.  Discussed goals of care with daughter and brother at bedside: family remains invested in pursuing any and all life maintaining/ life prolonging therapies.  Continue current mode of care PMT to continue to follow along, thank you for the consult.   Code Status/Advance Care Planning: Full code   Symptom Management:     Palliative Prophylaxis:  Frequent Pain Assessment  Additional Recommendations (Limitations, Scope, Preferences): Full Scope Treatment  Psycho-social/Spiritual:  Desire for further Chaplaincy support:yes Additional Recommendations: Caregiving  Support/Resources  Prognosis:  Unable to determine  Discharge Planning: To Be Determined      Primary Diagnoses: Present on Admission:  Acute encephalopathy  ICH (intracerebral hemorrhage) ([place])  History of pulmonary embolism  GAD (generalized anxiety disorder)  Malignant hypertension  Anxiety and depression  Bacteremia  Metabolic acidosis  Acute metabolic encephalopathy   I have reviewed the medical record, interviewed the patient and family, and examined the patient. The following aspects are pertinent.  Past Medical History:  Diagnosis Date   Hypertension  Social History   Socioeconomic History   Marital status: Married    Spouse name: Not on file   Number of children: Not on file   Years of education: Not on file   Highest education level: Not on file  Occupational History   Not on file  Tobacco Use   Smoking status: Never   Smokeless tobacco: Never  Vaping Use   Vaping Use: Never used  Substance and Sexual Activity   Alcohol use: Not Currently    Alcohol/week: 0.0 standard drinks of alcohol   Drug use: No   Sexual activity: Never  Other Topics Concern   Not on file  Social  History Narrative   Not on file   Social Determinants of Health   Financial Resource Strain: Not on file  Food Insecurity: Not on file  Transportation Needs: Not on file  Physical Activity: Not on file  Stress: Not on file  Social Connections: Not on file   Family History  Problem Relation Age of Onset   Diabetes Mother    Hypertension Mother    Heart disease Mother    Diabetes Maternal Aunt    Stroke Maternal Uncle    Diabetes Maternal Grandmother    Heart disease Maternal Grandmother    Diabetes Maternal Grandfather    Heart disease Maternal Grandfather    Heart disease Paternal Grandmother    Heart disease Paternal Grandfather    Scheduled Meds:  amantadine  100 mg Oral BID   amLODipine  10 mg Oral Daily   baclofen  5-15 mg Oral BID   carvedilol  6.25 mg Oral BID WC   enoxaparin (LOVENOX) injection  60 mg Subcutaneous Q12H   feeding supplement  1 Container Oral TID BM   food thickener  1 packet Oral TID WC & HS   gabapentin  100 mg Oral BID   losartan  25 mg Oral Daily   melatonin  3 mg Oral QHS   multivitamin with minerals  1 tablet Oral Daily   pantoprazole (PROTONIX) IV  40 mg Intravenous Q12H   podofilox   Topical BID   Continuous Infusions:  dextrose 5 % with KCl 20 mEq / L 20 mEq (07/02/22 1305)   [START ON 07/03/2022] fluconazole (DIFLUCAN) IV     fluconazole (DIFLUCAN) IV     penicillin G potassium 12 Million Units in dextrose 5 % 500 mL continuous infusion 12 Million Units (07/02/22 1314)   PRN Meds:.acetaminophen **OR** acetaminophen, famotidine, haloperidol lactate, hydrALAZINE, labetalol, metoprolol tartrate, ondansetron **OR** ondansetron (ZOFRAN) IV, senna-docusate Medications Prior to Admission:  Prior to Admission medications   Medication Sig Start Date End Date Taking? Authorizing Provider  acetaminophen (TYLENOL) 500 MG tablet Take 500-1,000 mg by mouth every 6 (six) hours as needed for moderate pain.   Yes [provider]  amantadine  (SYMMETREL) 100 MG capsule Take 1 capsule (100 mg total) by mouth 2 (two) times daily. 06/22/22  Yes Raulkar, Clide Deutscher, MD  amLODipine (NORVASC) 10 MG tablet Take 1 tablet (10 mg total) by mouth daily. 06/22/22  Yes Raulkar, Clide Deutscher, MD  apixaban (ELIQUIS) 5 MG TABS tablet Take 1 tablet (5 mg total) by mouth 2 (two) times daily. 06/22/22  Yes Raulkar, Clide Deutscher, MD  baclofen (LIORESAL) 10 MG tablet Take 5-15 mg by mouth 2 (two) times daily. Take 1/2 tablet (5 mg) and Take 1.5 tablets (15 mg) at bedtime 06/13/22  Yes [provider]  carvedilol (COREG) 6.25 MG tablet Take 1 tablet (  6.25 mg total) by mouth 2 (two) times daily with a meal. 06/22/22  Yes Raulkar, Clide Deutscher, MD  famotidine (PEPCID) 20 MG tablet Take 1 tablet (20 mg total) by mouth 2 (two) times daily. Patient taking differently: Take 20 mg by mouth 2 (two) times daily as needed for heartburn or indigestion. 05/22/22  Yes Raulkar, Clide Deutscher, MD  gabapentin (NEURONTIN) 100 MG capsule Take 1 capsule (100 mg total) by mouth 2 (two) times daily. Patient taking differently: Take 100 mg by mouth 2 (two) times daily as needed (nerve pain). 05/24/22  Yes Raulkar, Clide Deutscher, MD  hydrALAZINE (APRESOLINE) 50 MG tablet Take 1 tablet (50 mg total) by mouth every 6 (six) hours. 06/22/22  Yes Raulkar, Clide Deutscher, MD  hydrOXYzine (ATARAX) 10 MG tablet Take 1 tablet (10 mg total) by mouth 3 (three) times daily as needed. Patient taking differently: Take 10 mg by mouth 3 (three) times daily as needed for anxiety. 06/09/22  Yes Raulkar, Clide Deutscher, MD  losartan (COZAAR) 25 MG tablet Take 1 tablet (25 mg total) by mouth daily. 06/09/22  Yes Raulkar, Clide Deutscher, MD  magnesium gluconate (MAGONATE) 500 MG tablet Take 1 tablet (500 mg total) by mouth at bedtime. 05/22/22  Yes Raulkar, Clide Deutscher, MD  Multiple Vitamin (MULTIVITAMIN WITH MINERALS) TABS tablet Take 1 tablet by mouth daily. 05/22/22  Yes Raulkar, Clide Deutscher, MD  potassium chloride (KLOR-CON) 20 MEQ packet Take 20  mEq by mouth daily. Patient taking differently: Take 20 mEq by mouth daily as needed (low potassium). 05/22/22  Yes Raulkar, Clide Deutscher, MD  sertraline (ZOLOFT) 50 MG tablet Take 1 tablet (50 mg total) by mouth at bedtime. Patient taking differently: Take 25 mg by mouth at bedtime. 06/22/22  Yes Raulkar, Clide Deutscher, MD  busPIRone (BUSPAR) 5 MG tablet Take 1 tablet (5 mg total) by mouth 3 (three) times daily as needed. 06/23/22   Raulkar, Clide Deutscher, MD  tiZANidine (ZANAFLEX) 4 MG tablet Take 1 tablet (4 mg total) by mouth at bedtime. Patient not taking: Reported on 06/23/2022 05/31/22   Izora Ribas, MD   Allergies  Allergen Reactions   Lisinopril Cough   Review of Systems Confused  Physical Exam Weak appearing confused lady.  Shallow breath sounds Awakens but doesn't verbalize  Drooling Abdomen not distended Generalized weakness.   Vital Signs: BP (!) 161/91 (BP Location: Right Arm)   Pulse (!) 103   Temp 99.6 F (37.6 C) (Axillary)   Resp 20   Ht '5\' 3"'  (1.6 m)   Wt 56.3 kg   SpO2 98%   BMI 21.99 kg/m  Pain Scale: 0-10   Pain Score: 3    SpO2: SpO2: 98 % O2 Device:SpO2: 98 % O2 Flow Rate: .   IO: Intake/output summary:  Intake/Output Summary (Last 24 hours) at 07/02/2022 1406 Last data filed at 07/02/2022 1100 Gross per 24 hour  Intake 1556.54 ml  Output --  Net 1556.54 ml    LBM: Last BM Date : 07/01/22 Baseline Weight: Weight: 56.3 kg Most recent weight: Weight: 56.3 kg     Palliative Assessment/Data:   PPS 40%  Time In:  1300 Time Out:  1400 Time Total:  60  Greater than 50%  of this time was spent counseling and coordinating care related to the above assessment and plan.  Signed by: Loistine Chance, MD   Please contact Palliative Medicine Team phone at [phone] for questions and concerns.  For individual provider: See Shea Evans

## 2022-07-02 NOTE — Progress Notes (Signed)
[facility] PROGRESS NOTE    Progress Note  Sarah Myers  TFT:[id number] DOB: 11/27/1971 DOA: 06/28/2022 PCP: Shayne Alken, MD     Brief Narrative:   Sarah Myers is an 51 y.o. female past medical history significant for nontraumatic intracranial hemorrhage, with spastic hemiplegia and cognitive deficits, bilateral PE in March of this year on Eliquis, essential hypertension now comes in to the ED for increased anxiety and decreased oral intake and increased worsening deficits since discharge from inpatient rehab in April, she was recently discharged on 06/26/2022 for which she was treated empirically with IV Rocephin for SIRS but no source of infection found.  Right since discharge the patient has continued increased anxiety and restlessness not eating and drinking well 2 out of 2 blood cultures from 06/23/2022 is growing gram-positive rods  Awaiting skilled nursing select placement. Assessment/Plan:   Acute metabolic encephalopathy According to the daughter the patient has had poor oral intake. CT of the head showed no acute findings. More awake today, she relates she is significantly thirsty. This likely due to infectious etiology. Awaiting skilled nursing facility placement. Patient is refusing all of her medications and not eating at times. I discussed with the daughter that is a good idea to meet with [facility] care and she has agreed.  Dysphagia: According to the daughter the patient is complaining of dysphagia. She has no oral thrush, will start on empiric Diflucan, get a barium esophagus.  Gram-positive rod bacteremia: On 06/23/2022 blood cultures were positive for gram-positive rods 2 out of 2 surveillance blood cultures, surveillance blood cultures have been negative till date. ID was consulted recommended to switch to oral amoxicillin to complete a 10-day course as an outpatient.   They have signed off.  History of intracranial hemorrhage: With  residual spastic hemiplegia and cognitive deficit. CT of the head showed no acute findings. Patient daughter relates she is unable to care for her mom despite home health services she would like skilled nursing facility placement. Physical therapy recommended skilled nursing facility placement.  Hypercalcemia: Likely due to dehydration continue with fluid resuscitation.  History of PE: Resume Eliquis.  Anxiety: Continue Zoloft and hydroxyzine.  Central hypertension: Patient continues to be elevated she continues to refuse her oral antihypertensive medication, she is currently on IV hydralazine and IV labetalol. We will have to discuss with the daughter moving towards hospice care.  Normal anion gap metabolic acidosis: Resolving with fluid resuscitation.  Hypokalemia: Replete orally recheck in the morning.   DVT prophylaxis: lovenox Family Communication:daughter Status is: Observation The patient will require care spanning > 2 midnights and should be moved to inpatient because: Acute metabolic encephalopathy probably due to bacteremia    Code Status:     Code Status Orders  (From admission, onward)           Start     Ordered   06/28/22 1953  Full code  Continuous        06/28/22 1953           Code Status History     Date Active Date Inactive Code Status Order ID Comments User Context   06/23/2022 2206 06/26/2022 1812 Full Code [id number]  Hannah Beat, MD ED   04/04/2022 1526 04/26/2022 1812 Full Code [id number]  Milinda Antis, PA-C Inpatient   03/22/2022 0523 04/04/2022 1517 Full Code [id number]  Leane Para, CNA Inpatient   02/05/2022 0849 03/22/2022 0350 Full Code [id number]  Jefferson Fuel, MD ED   02/10/2012  1504 02/11/2012 1448 Full Code [id number]  Lorenso Quarry H Inpatient         IV Access:   Peripheral IV   Procedures and diagnostic studies:   No results found.   Medical Consultants:   None.   Subjective:    Sarah Myers  nonverbal this morning.  Objective:    Vitals:   07/02/22 0613 07/02/22 0630 07/02/22 0650 07/02/22 0820  BP: (!) 170/117 (!) 170/101 (!) 144/84 (!) 172/90  Pulse: (!) 107 96 100 (!) 110  Resp: 19 16 19 18   Temp: (!) 100.6 F (38.1 C) 99.8 F (37.7 C) 99.8 F (37.7 C) 99.2 F (37.3 C)  TempSrc: Oral  Oral Oral  SpO2: 99% 97% 98% 97%  Weight:      Height:       SpO2: 97 %   Intake/Output Summary (Last 24 hours) at 07/02/2022 0855 Last data filed at 07/02/2022 0300 Gross per 24 hour  Intake 2217.48 ml  Output --  Net 2217.48 ml    Filed Weights   06/29/22 1711  Weight: 56.3 kg    Exam: General exam: In no acute distress. Respiratory system: Good air movement and clear to auscultation. Cardiovascular system: S1 & S2 heard, RRR. No JVD. Gastrointestinal system: Abdomen is nondistended, soft and nontender.  Extremities: No pedal edema. Skin: No rashes, lesions or ulcers  Data Reviewed:    Labs: Basic Metabolic Panel: Recent Labs  Lab 06/26/22 0603 06/28/22 1615 06/29/22 0528 06/30/22 1022  NA 142 145 145 147*  K 3.1* 3.6 3.0* 3.3*  CL 112* 118* 115* 120*  CO2 19* 14* 19* 18*  GLUCOSE 78 104* 125* 115*  BUN 7 9 9 7   CREATININE 0.71 0.82 0.68 0.79  CALCIUM 10.3 11.8* 10.9* 11.1*  MG 1.7  --   --   --     GFR Estimated Creatinine Clearance: 70.4 mL/min (by C-G formula based on SCr of 0.79 mg/dL). Liver Function Tests: Recent Labs  Lab 06/26/22 0603 06/28/22 1615  AST 22 24  ALT 22 25  ALKPHOS 63 72  BILITOT 0.8 0.9  PROT 6.8 7.7  ALBUMIN 3.6 4.1    No results for input(s): "LIPASE", "AMYLASE" in the last 168 hours. Recent Labs  Lab 06/29/22 0645  AMMONIA 25    Coagulation profile No results for input(s): "INR", "PROTIME" in the last 168 hours.  COVID-19 Labs  No results for input(s): "DDIMER", "FERRITIN", "LDH", "CRP" in the last 72 hours.  Lab Results  Component Value Date   SARSCOV2NAA NEGATIVE 06/23/2022   SARSCOV2NAA NEGATIVE  02/05/2022    CBC: Recent Labs  Lab 06/26/22 0603 06/28/22 1615 06/29/22 0528  WBC 4.9 7.3 6.8  NEUTROABS  --  4.7  --   HGB 12.3 13.1 12.5  HCT 37.8 39.2 35.8*  MCV 86.5 83.9 81.9  PLT 144* 196 178    Cardiac Enzymes: No results for input(s): "CKTOTAL", "CKMB", "CKMBINDEX", "TROPONINI" in the last 168 hours. BNP (last 3 results) No results for input(s): "PROBNP" in the last 8760 hours. CBG: Recent Labs  Lab 07/01/22 1237  GLUCAP 128*   D-Dimer: No results for input(s): "DDIMER" in the last 72 hours. Hgb A1c: No results for input(s): "HGBA1C" in the last 72 hours. Lipid Profile: No results for input(s): "CHOL", "HDL", "LDLCALC", "TRIG", "CHOLHDL", "LDLDIRECT" in the last 72 hours. Thyroid function studies: No results for input(s): "TSH", "T4TOTAL", "T3FREE", "THYROIDAB" in the last 72 hours.  Invalid input(s): "FREET3"  Anemia work up:  No results for input(s): "VITAMINB12", "FOLATE", "FERRITIN", "TIBC", "IRON", "RETICCTPCT" in the last 72 hours.  Sepsis Labs: Recent Labs  Lab 06/26/22 0603 06/28/22 1615 06/28/22 1827 06/28/22 2221 06/29/22 0528  PROCALCITON  --   --   --  <0.10  --   WBC 4.9 7.3  --   --  6.8  LATICACIDVEN  --  0.7 0.7  --   --     Microbiology Recent Results (from the past 240 hour(s))  Urine Culture     Status: Abnormal   Collection Time: 06/23/22  7:49 PM   Specimen: Urine, Clean Catch  Result Value Ref Range Status   Specimen Description   Final    URINE, CLEAN CATCH Performed at [facility], [address] [place], [place] [address]    Special Requests   Final    NONE Performed at [facility], [address] [place], [place] [address]    Culture (A)  Final    >=100,000 COLONIES/mL MULTIPLE SPECIES PRESENT, SUGGEST RECOLLECTION   Report Status 06/25/2022 FINAL  Final  Culture, blood (routine x 2)     Status: None   Collection Time: 06/23/22  8:55 PM   Specimen: BLOOD  Result Value  Ref Range Status   Specimen Description   Final    BLOOD RIGHT ANTECUBITAL Performed at [facility], [address] [place], [place] [address]    Special Requests   Final    BOTTLES DRAWN AEROBIC AND ANAEROBIC Blood Culture adequate volume Performed at [facility], [address], [place], [place] [address]    Culture   Final    NO GROWTH 5 DAYS Performed at [facility], [address] [place], [place] [address]    Report Status 06/29/2022 FINAL  Final  Culture, blood (routine x 2)     Status: Abnormal   Collection Time: 06/23/22  9:20 PM   Specimen: BLOOD  Result Value Ref Range Status   Specimen Description   Final    BLOOD BLOOD RIGHT HAND Performed at [facility], [address] [place], [place] [address]    Special Requests   Final    BOTTLES DRAWN AEROBIC AND ANAEROBIC Blood Culture adequate volume Performed at [facility], [address] [place], [place] [address]    Culture  Setup Time   Final    GRAM POSITIVE RODS ANAEROBIC BOTTLE ONLY CRITICAL RESULT CALLED TO, READ BACK BY AND VERIFIED WITH: PHARMD CRYSTAL ROBERTSON ON 06/28/22 @ 1610 BY DRT    Culture (A)  Final    PROPIONIBACTERIUM ACNES Standardized susceptibility testing for this organism is not available. Performed at [facility], [address] [place], [place] [address]    Report Status 06/30/2022 FINAL  Final  SARS Coronavirus 2 by RT PCR (hospital order, performed in [facility] hospital lab) *cepheid single result test* Anterior Nasal Swab     Status: None   Collection Time: 06/23/22  9:34 PM   Specimen: Anterior Nasal Swab  Result Value Ref Range Status   SARS Coronavirus 2 by RT PCR NEGATIVE NEGATIVE Final    Comment: (NOTE) SARS-CoV-2 target nucleic acids are NOT DETECTED.  The SARS-CoV-2 RNA is generally detectable in upper and lower respiratory specimens during the acute phase of infection. The  lowest concentration of SARS-CoV-2 viral copies this assay can detect is 250 copies / mL. A negative result does not preclude SARS-CoV-2 infection and  should not be used as the sole basis for treatment or other patient management decisions.  A negative result may occur with improper specimen collection / handling, submission of specimen other than nasopharyngeal swab, presence of viral mutation(s) within the areas targeted by this assay, and inadequate number of viral copies (<250 copies / mL). A negative result must be combined with clinical observations, patient history, and epidemiological information.  Fact Sheet for Patients:   RoadLapTop.co.za  Fact Sheet for Healthcare Providers: http://kim-miller.com/  This test is not yet approved or  cleared by the [place] FDA and has been authorized for detection and/or diagnosis of SARS-CoV-2 by FDA under an Emergency Use Authorization (EUA).  This EUA will remain in effect (meaning this test can be used) for the duration of the COVID-19 declaration under Section 564(b)(1) of the Act, 21 U.S.C. section 360bbb-3(b)(1), unless the authorization is terminated or revoked sooner.  Performed at [facility], [address], [place], [place] [address]   Blood Culture (routine x 2)     Status: None (Preliminary result)   Collection Time: 06/28/22  4:15 PM   Specimen: BLOOD  Result Value Ref Range Status   Specimen Description   Final    BLOOD SITE NOT SPECIFIED Performed at [facility], [address], [place], [place] [address]    Special Requests   Final    BOTTLES DRAWN AEROBIC ONLY Blood Culture adequate volume Performed at [facility], [address], [place], [place] [address]    Culture   Final    NO GROWTH 4 DAYS Performed at [facility], [address], [place], [place] [address]    Report Status PENDING  Incomplete  Blood  Culture (routine x 2)     Status: None (Preliminary result)   Collection Time: 06/28/22  6:27 PM   Specimen: BLOOD  Result Value Ref Range Status   Specimen Description   Final    BLOOD RIGHT ANTECUBITAL Performed at [facility], [address] [place], [place] [address]    Special Requests   Final    BOTTLES DRAWN AEROBIC ONLY Blood Culture adequate volume Performed at [facility], [address], [place], [place] [address]    Culture   Final    NO GROWTH 4 DAYS Performed at [facility], [address], [place], [place] [address]    Report Status PENDING  Incomplete  Urine Culture     Status: None   Collection Time: 06/28/22  6:27 PM   Specimen: In/Out Cath Urine  Result Value Ref Range Status   Specimen Description   Final    IN/OUT CATH URINE Performed at [facility], [address] [place], [place] [address]    Special Requests   Final    NONE Performed at [facility], [address] [place], [place] [address]    Culture   Final    NO GROWTH Performed at [facility], [address] [place], [place] [address]    Report Status 06/29/2022 FINAL  Final     Medications:    amantadine  100 mg Oral BID   amLODipine  10 mg Oral Daily   apixaban  5 mg Oral BID   baclofen  5-15 mg Oral BID   carvedilol  6.25 mg Oral BID WC   feeding supplement  1 Container Oral TID BM   food thickener  1 packet  Oral TID WC & HS   gabapentin  100 mg Oral BID   losartan  25 mg Oral Daily   melatonin  3 mg Oral QHS   multivitamin with minerals  1 tablet Oral Daily   podofilox   Topical BID   Continuous Infusions:  dextrose 5 % with KCl 20 mEq / L 20 mEq (07/02/22 0203)   penicillin G potassium 12 Million Units in dextrose 5 % 500 mL continuous infusion 12 Million Units (07/01/22 2150)      LOS: 3 days   Marinda Elk  [facility]  07/02/2022, 8:55 AM

## 2022-07-02 NOTE — Progress Notes (Signed)
Patient intermittently restless & uncomfortable throughout day & at other times very lethargic. She did have clear emesis several times throughout the day of moderate amounts. However 2 of the emesis episodes were watery & brown moderate in size. Patient was able to express "yes" when asked about pain throughout the day, she was also able to point to pain location which was upper abdomen & later in afternoon pointed to throat as well. She was not able to safely take any of her po meds (which seems to be an ongoing issue). Attempted small amount of liquid before attempting meds (this is all pt has been given orally today) & she spit liquid out. It is possible that patient is truly not trying to be uncooperative & unable/scared to swallow d/t her discomfort/pain. Patients abdomen is tender to palpitation.   *MD Robb Matar successfully notified of above via secure chat this morning x2 & afternoon. Per protocol notified this MD of unsuccessful lab draw attempts by phlebotomy.   MD Linna Darner came to bedside to speak with patients family & assess patient for further care. IV prn pain meds now available should she need them.    Transported patient to CT scan of abdomen. IVF's running  NPO as of late afternoon **ASPIRATION precautions - frequent oral care, suction, HOB remains elevated     Also new: patient had movement in left leg/foot.  Patients daughter at bedside throughout day as well as an uncle & church friend that stopped by.  I spent several hours providing care to patient- assessing, cleaning, repositioning, updating family & addressing concerns best I could & reaching out to MD for further guidance/clarification for patient & family. Charge RN Toniann Fail also provided support in patients care today.

## 2022-07-03 ENCOUNTER — Inpatient Hospital Stay (HOSPITAL_COMMUNITY): Payer: Commercial Managed Care - HMO

## 2022-07-03 ENCOUNTER — Ambulatory Visit: Payer: 59 | Admitting: Cardiology

## 2022-07-03 ENCOUNTER — Encounter: Payer: Commercial Managed Care - HMO | Admitting: Physical Medicine and Rehabilitation

## 2022-07-03 DIAGNOSIS — F411 Generalized anxiety disorder: Secondary | ICD-10-CM | POA: Diagnosis not present

## 2022-07-03 DIAGNOSIS — Z515 Encounter for palliative care: Secondary | ICD-10-CM | POA: Diagnosis not present

## 2022-07-03 DIAGNOSIS — R1319 Other dysphagia: Secondary | ICD-10-CM

## 2022-07-03 DIAGNOSIS — G9341 Metabolic encephalopathy: Secondary | ICD-10-CM | POA: Diagnosis not present

## 2022-07-03 DIAGNOSIS — G9340 Encephalopathy, unspecified: Secondary | ICD-10-CM | POA: Diagnosis not present

## 2022-07-03 DIAGNOSIS — R7881 Bacteremia: Secondary | ICD-10-CM | POA: Diagnosis not present

## 2022-07-03 DIAGNOSIS — Z7409 Other reduced mobility: Secondary | ICD-10-CM

## 2022-07-03 DIAGNOSIS — Z7189 Other specified counseling: Secondary | ICD-10-CM | POA: Diagnosis not present

## 2022-07-03 LAB — BLOOD GAS, ARTERIAL
Acid-base deficit: 0.2 mmol/L (ref 0.0–2.0)
Bicarbonate: 22.6 mmol/L (ref 20.0–28.0)
FIO2: 21 %
O2 Saturation: 99.2 %
Patient temperature: 37.7
pCO2 arterial: 32 mmHg (ref 32–48)
pH, Arterial: 7.46 — ABNORMAL HIGH (ref 7.35–7.45)
pO2, Arterial: 99 mmHg (ref 83–108)

## 2022-07-03 LAB — CULTURE, BLOOD (ROUTINE X 2)
Culture: NO GROWTH
Culture: NO GROWTH
Special Requests: ADEQUATE
Special Requests: ADEQUATE

## 2022-07-03 LAB — GLUCOSE, CAPILLARY: Glucose-Capillary: 137 mg/dL — ABNORMAL HIGH (ref 70–99)

## 2022-07-03 MED ORDER — SODIUM CHLORIDE 0.45 % IV SOLN: INTRAVENOUS | Status: DC

## 2022-07-03 MED ORDER — ENSURE ENLIVE PO LIQD
237.0000 mL | Freq: Two times a day (BID) | ORAL | Status: DC
  Administered 2022-07-05: 237 mL via ORAL

## 2022-07-03 MED ORDER — SODIUM CHLORIDE 0.9 % IV SOLN: INTRAVENOUS | Status: DC | PRN

## 2022-07-03 NOTE — Progress Notes (Addendum)
Speech Language Pathology Treatment: Dysphagia  Patient Details Name: Sarah Myers MRN: [id number] DOB: 1971/03/13 Today's Date: 07/03/2022 Time: 1751-0258 SLP Time Calculation (min) (ACUTE ONLY): 13 min  Assessment / Plan / Recommendation Clinical Impression  Pt had bout of decreased responsiveness this morning, made NPO, is now alert and MD requesting ST visit. Prior to NPO a Dys 2 texture and nectar thick liquids was recommended. Daughter at bedside for most of session and in agreement with recommendations to restart Dys 2/nectar given pt performance during session. She was repositioned and eagerly accepted apple juice thickened to nectar consistency and a Dys 3 texture. There were no signs of aspiration with cup or straw sips nectar and needed additional time x 1 to extract liquid from straw. Although her mastication appeared functional with cracker, given her propensity for waxing/waning alertness, recommend continue baseline texture of Dys 2 (daughter cuts all food into small pieces at home). Continue nectar thick liquids, straws allowed, crush pills and full assist with meals.When pt left inpatient rehab 04/2022 she was consuming Dys 3 and thin liquids. ST will continue for appropriate upgrade when able.    HPI HPI: 51yo female admitted 06/28/22 with increased anxiety, decreased PO intake, worsening deficits since discharge from CIR in May (DC'd on D3/thin). 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      SLP Plan  Continue with current plan of care      Recommendations for follow up therapy are one component of a multi-disciplinary discharge planning process, led by the attending physician.  Recommendations may be updated based on patient status, additional functional criteria and insurance authorization.    Recommendations  Diet  recommendations: Dysphagia 2 (fine chop);Nectar-thick liquid Liquids provided via: Cup;Straw Medication Administration: Crushed with puree Supervision: Staff to assist with self feeding;Full supervision/cueing for compensatory strategies Compensations: Minimize environmental distractions;Slow rate;Small sips/bites Postural Changes and/or Swallow Maneuvers: Seated upright 90 degrees;Upright 30-60 min after meal                Oral Care Recommendations: Oral care BID Follow Up Recommendations: Follow physician's recommendations for discharge plan and follow up therapies Assistance recommended at discharge: Frequent or constant Supervision/Assistance SLP Visit Diagnosis: Dysphagia, unspecified (R13.10) Plan: Continue with current plan of care           Royce Macadamia  07/03/2022, 1:18 PM

## 2022-07-03 NOTE — Plan of Care (Signed)
  Problem: Fluid Volume: Goal: Hemodynamic stability will improve Outcome: Progressing   Problem: Clinical Measurements: Goal: Diagnostic test results will improve Outcome: Progressing Goal: Signs and symptoms of infection will decrease Outcome: Progressing   Problem: Education: Goal: Knowledge of General Education information will improve Description: Including pain rating scale, medication(s)/side effects and non-pharmacologic comfort measures Outcome: Progressing   Problem: Health Behavior/Discharge Planning: Goal: Ability to manage health-related needs will improve Outcome: Progressing   Problem: Clinical Measurements: Goal: Ability to maintain clinical measurements within normal limits will improve Outcome: Progressing Goal: Will remain free from infection Outcome: Progressing Goal: Diagnostic test results will improve Outcome: Progressing Goal: Respiratory complications will improve Outcome: Progressing Goal: Cardiovascular complication will be avoided Outcome: Progressing   Problem: Activity: Goal: Risk for activity intolerance will decrease Outcome: Progressing   Problem: Nutrition: Goal: Adequate nutrition will be maintained Outcome: Progressing   Problem: Coping: Goal: Level of anxiety will decrease Outcome: Progressing   Problem: Elimination: Goal: Will not experience complications related to bowel motility Outcome: Progressing Goal: Will not experience complications related to urinary retention Outcome: Progressing   Problem: Pain Managment: Goal: General experience of comfort will improve Outcome: Progressing   Problem: Safety: Goal: Ability to remain free from injury will improve Outcome: Progressing   Problem: Skin Integrity: Goal: Risk for impaired skin integrity will decrease Outcome: Progressing   

## 2022-07-03 NOTE — Progress Notes (Signed)
[facility] PROGRESS NOTE    Progress Note  Sarah Myers  LFY:[id number] DOB: July 14, 1971 DOA: 06/28/2022 PCP: Shayne Alken, MD     Brief Narrative:   Sarah Myers is an 51 y.o. female past medical history significant for nontraumatic intracranial hemorrhage, with spastic hemiplegia and cognitive deficits, bilateral PE in March of this year on Eliquis, essential hypertension now comes in to the ED for increased anxiety and decreased oral intake and increased worsening deficits since discharge from inpatient rehab in April, she was recently discharged on 06/26/2022 for which she was treated empirically with IV Rocephin for SIRS but no source of infection found.  Right since discharge the patient has continued increased anxiety and restlessness not eating and drinking well 2 out of 2 blood cultures from 06/23/2022 is growing gram-positive rods  Awaiting skilled nursing select placement. Assessment/Plan:   Acute metabolic encephalopathy According to the daughter the patient has had poor oral intake. CT of the head showed no acute findings. More awake today, she relates she is significantly thirsty. Patient is refusing all of her medications and not eating at times. Palliative care is not making any progress with family. They still want the patient to be a full code.  Dysphagia: According to the daughter the patient is complaining of dysphagia. CT scan of the abdomen pelvis showed no acute findings. GI has been consulted  Gram-positive rod bacteremia: On 06/23/2022 blood cultures were positive for gram-positive rods 2 out of 2 surveillance blood cultures, surveillance blood cultures have been negative till date. ID was consulted recommended to switch to oral amoxicillin to complete a 10-day course as an outpatient.   They have signed off.  History of intracranial hemorrhage: With residual spastic hemiplegia and cognitive deficit. CT of the head showed no acute  findings. Patient daughter relates she is unable to care for her mom despite home health services she would like skilled nursing facility placement. Physical therapy recommended skilled nursing facility placement.  Hypercalcemia: Likely due to dehydration continue with fluid resuscitation.  History of PE: Resume Eliquis.  Anxiety: Continue Zoloft and hydroxyzine.  Essential hypertension: Patient continues to be elevated she continues to refuse her oral antihypertensive medication, she is currently on IV hydralazine and IV labetalol. We will have to discuss with the daughter moving towards hospice care.  Normal anion gap metabolic acidosis: Resolving with fluid resuscitation.  Hypokalemia: Replete orally recheck in the morning.   DVT prophylaxis: lovenox Family Communication:daughter Status is: Observation The patient will require care spanning > 2 midnights and should be moved to inpatient because: Acute metabolic encephalopathy probably due to bacteremia    Code Status:     Code Status Orders  (From admission, onward)           Start     Ordered   06/28/22 1953  Full code  Continuous        06/28/22 1953           Code Status History     Date Active Date Inactive Code Status Order ID Comments User Context   06/23/2022 2206 06/26/2022 1812 Full Code [id number]  Hannah Beat, MD ED   04/04/2022 1526 04/26/2022 1812 Full Code [id number]  Milinda Antis, PA-C Inpatient   03/22/2022 0523 04/04/2022 1517 Full Code [id number]  Leane Para, CNA Inpatient   02/05/2022 0849 03/22/2022 0350 Full Code [id number]  Jefferson Fuel, MD ED   02/10/2012 1504 02/11/2012 1448 Full Code [id number]  Brynda Rim Inpatient  IV Access:   Peripheral IV   Procedures and diagnostic studies:   CT HEAD WO CONTRAST ( )  Result Date: 07/03/2022 CLINICAL DATA:  Altered mental status. EXAM: CT HEAD WITHOUT CONTRAST TECHNIQUE: Contiguous axial images were obtained from the base  of the skull through the vertex without intravenous contrast. RADIATION DOSE REDUCTION: This exam was performed according to the departmental dose-optimization program which includes automated exposure control, adjustment of the mA and/or kV according to patient size and/or use of iterative reconstruction technique. COMPARISON:  Head CT dated 06/28/2022. FINDINGS: Evaluation of this exam is limited due to motion artifact. Brain: Similar appearance the ventricles and sulci and mild white matter hypodensity. No large acute intracranial hemorrhage noted. Evaluation however is very limited, almost nondiagnostic due to severe motion artifact. No midline shift. Vascular: No acute findings. Skull: No acute fracture. Sinuses/Orbits: No air-fluid level. Other: None IMPRESSION: Very limited, almost nondiagnostic, exam due to severe motion artifact. No large acute intracranial hemorrhage. Electronically Signed   By: Elgie Collard M.D.   On: 07/03/2022 01:36   CT ABDOMEN PELVIS WO CONTRAST  Result Date: 07/02/2022 CLINICAL DATA:  Nausea and vomiting EXAM: CT ABDOMEN AND PELVIS WITHOUT CONTRAST TECHNIQUE: Multidetector CT imaging of the abdomen and pelvis was performed following the standard protocol without IV contrast. Unenhanced CT was performed per clinician order. Lack of IV contrast limits sensitivity and specificity, especially for evaluation of abdominal/pelvic solid viscera. RADIATION DOSE REDUCTION: This exam was performed according to the departmental dose-optimization program which includes automated exposure control, adjustment of the mA and/or kV according to patient size and/or use of iterative reconstruction technique. COMPARISON:  03/09/2022 FINDINGS: Lower chest: Small bilateral pleural effusions, slightly increased since prior study. Minimal dependent lower lobe atelectasis. Trace pericardial fluid unchanged. Hepatobiliary: Stable cholelithiasis. No evidence of acute cholecystitis. Unremarkable  unenhanced appearance of the liver. Pancreas: Unremarkable unenhanced appearance. Spleen: Unremarkable unenhanced appearance. Adrenals/Urinary Tract: No urinary tract calculi or obstructive uropathy. The adrenals are unremarkable. Bladder is decompressed, limiting its evaluation. Stomach/Bowel: No bowel obstruction or ileus. Normal appendix right lower quadrant. Scattered diverticulosis of the distal colon without evidence of acute diverticulitis. No bowel wall thickening or inflammatory change. Interval removal of the percutaneous gastrostomy tube seen previously. Vascular/Lymphatic: Aortic atherosclerosis. No enlarged abdominal or pelvic lymph nodes. Reproductive: Uterus and bilateral adnexa are unremarkable. Other: No free fluid or free intraperitoneal gas. No abdominal wall hernia. Musculoskeletal: No acute or destructive bony lesions. Reconstructed images demonstrate no additional findings. IMPRESSION: 1. Cholelithiasis without acute cholecystitis. 2. Small bilateral pleural effusions, slightly increased since prior study. 3. Distal colonic diverticulosis without diverticulitis. 4.  Aortic Atherosclerosis (ICD10-I70.0). Electronically Signed   By: Sharlet Salina M.D.   On: 07/02/2022 15:46     Medical Consultants:   None.   Subjective:    Sarah Myers nonverbal this morning.  Objective:    Vitals:   07/02/22 1828 07/03/22 0025 07/03/22 0026 07/03/22 0438  BP: (!) 155/90 124/84  (!) 157/87  Pulse: (!) 105 94  100  Resp: 18 15  17   Temp: 98 F (36.7 C) 99.8 F (37.7 C)  99.6 F (37.6 C)  TempSrc: Axillary Oral  Axillary  SpO2: 99% 90% 94% 100%  Weight:      Height:       SpO2: 100 %   Intake/Output Summary (Last 24 hours) at 07/03/2022 0851 Last data filed at 07/03/2022 0451 Gross per 24 hour  Intake 2070.59 ml  Output 400 ml  Net 1670.59 ml  Filed Weights   06/29/22 1711  Weight: 56.3 kg    Exam: General exam: In no acute distress. Respiratory system: Good  air movement and clear to auscultation. Cardiovascular system: S1 & S2 heard, RRR. No JVD. Gastrointestinal system: Abdomen is nondistended, soft and nontender.  Extremities: No pedal edema. Skin: No rashes, lesions or ulcers  Data Reviewed:    Labs: Basic Metabolic Panel: Recent Labs  Lab 06/28/22 1615 06/29/22 0528 06/30/22 1022 07/02/22 1929  NA 145 145 147* 131*  K 3.6 3.0* 3.3* 3.9  CL 118* 115* 120* 104  CO2 14* 19* 18* 20*  GLUCOSE 104* 125* 115* 162*  BUN 9 9 7 7   CREATININE 0.82 0.68 0.79 0.64  CALCIUM 11.8* 10.9* 11.1* 10.4*    GFR Estimated Creatinine Clearance: 70.4 mL/min (by C-G formula based on SCr of 0.64 mg/dL). Liver Function Tests: Recent Labs  Lab 06/28/22 1615  AST 24  ALT 25  ALKPHOS 72  BILITOT 0.9  PROT 7.7  ALBUMIN 4.1    No results for input(s): "LIPASE", "AMYLASE" in the last 168 hours. Recent Labs  Lab 06/29/22 0645  AMMONIA 25    Coagulation profile No results for input(s): "INR", "PROTIME" in the last 168 hours.  COVID-19 Labs  No results for input(s): "DDIMER", "FERRITIN", "LDH", "CRP" in the last 72 hours.  Lab Results  Component Value Date   SARSCOV2NAA NEGATIVE 06/23/2022   SARSCOV2NAA NEGATIVE 02/05/2022    CBC: Recent Labs  Lab 06/28/22 1615 06/29/22 0528 07/02/22 1929  WBC 7.3 6.8 11.6*  NEUTROABS 4.7  --  9.4*  HGB 13.1 12.5 13.1  HCT 39.2 35.8* 36.7  MCV 83.9 81.9 78.6*  PLT 196 178 205    Cardiac Enzymes: No results for input(s): "CKTOTAL", "CKMB", "CKMBINDEX", "TROPONINI" in the last 168 hours. BNP (last 3 results) No results for input(s): "PROBNP" in the last 8760 hours. CBG: Recent Labs  Lab 07/01/22 1237 07/03/22 0049  GLUCAP 128* 137*    D-Dimer: No results for input(s): "DDIMER" in the last 72 hours. Hgb A1c: No results for input(s): "HGBA1C" in the last 72 hours. Lipid Profile: No results for input(s): "CHOL", "HDL", "LDLCALC", "TRIG", "CHOLHDL", "LDLDIRECT" in the last 72  hours. Thyroid function studies: No results for input(s): "TSH", "T4TOTAL", "T3FREE", "THYROIDAB" in the last 72 hours.  Invalid input(s): "FREET3"  Anemia work up: No results for input(s): "VITAMINB12", "FOLATE", "FERRITIN", "TIBC", "IRON", "RETICCTPCT" in the last 72 hours.  Sepsis Labs: Recent Labs  Lab 06/28/22 1615 06/28/22 1827 06/28/22 2221 06/29/22 0528 07/02/22 1929  PROCALCITON  --   --  <0.10  --   --   WBC 7.3  --   --  6.8 11.6*  LATICACIDVEN 0.7 0.7  --   --   --     Microbiology Recent Results (from the past 240 hour(s))  Urine Culture     Status: Abnormal   Collection Time: 06/23/22  7:49 PM   Specimen: Urine, Clean Catch  Result Value Ref Range Status   Specimen Description   Final    URINE, CLEAN CATCH Performed at [facility], [address] [place], [place]    Special Requests   Final    NONE Performed at [facility], [address], [place], [place]    Culture (A)  Final    >=100,000 COLONIES/mL MULTIPLE SPECIES PRESENT, SUGGEST RECOLLECTION   Report Status 06/25/2022 FINAL  Final  Culture, blood (routine x 2)  Status: None   Collection Time: 06/23/22  8:55 PM   Specimen: BLOOD  Result Value Ref Range Status   Specimen Description   Final    BLOOD RIGHT ANTECUBITAL Performed at [facility], [address], [place], [place] [address]    Special Requests   Final    BOTTLES DRAWN AEROBIC AND ANAEROBIC Blood Culture adequate volume Performed at [facility], [address], [place], [place] [address]    Culture   Final    NO GROWTH 5 DAYS Performed at [facility], [address], [place], [place] [address]    Report Status 06/29/2022 FINAL  Final  Culture, blood (routine x 2)     Status: Abnormal   Collection Time: 06/23/22  9:20 PM   Specimen: BLOOD  Result Value Ref Range Status   Specimen Description   Final    BLOOD BLOOD RIGHT  HAND Performed at [facility], [address], [place], [place] [address]    Special Requests   Final    BOTTLES DRAWN AEROBIC AND ANAEROBIC Blood Culture adequate volume Performed at [facility], [address], [place], [place] [address]    Culture  Setup Time   Final    GRAM POSITIVE RODS ANAEROBIC BOTTLE ONLY CRITICAL RESULT CALLED TO, READ BACK BY AND VERIFIED WITH: PHARMD CRYSTAL ROBERTSON ON 06/28/22 @ 1610 BY DRT    Culture (A)  Final    PROPIONIBACTERIUM ACNES Standardized susceptibility testing for this organism is not available. Performed at [facility], [address], [place], [place] [address]    Report Status 06/30/2022 FINAL  Final  SARS Coronavirus 2 by RT PCR (hospital order, performed in [facility] hospital lab) *cepheid single result test* Anterior Nasal Swab     Status: None   Collection Time: 06/23/22  9:34 PM   Specimen: Anterior Nasal Swab  Result Value Ref Range Status   SARS Coronavirus 2 by RT PCR NEGATIVE NEGATIVE Final    Comment: (NOTE) SARS-CoV-2 target nucleic acids are NOT DETECTED.  The SARS-CoV-2 RNA is generally detectable in upper and lower respiratory specimens during the acute phase of infection. The lowest concentration of SARS-CoV-2 viral copies this assay can detect is 250 copies / mL. A negative result does not preclude SARS-CoV-2 infection and should not be used as the sole basis for treatment or other patient management decisions.  A negative result may occur with improper specimen collection / handling, submission of specimen other than nasopharyngeal swab, presence of viral mutation(s) within the areas targeted by this assay, and inadequate number of viral copies (<250 copies / mL). A negative result must be combined with clinical observations, patient history, and epidemiological information.  Fact Sheet for Patients:   RoadLapTop.co.za  Fact Sheet for  Healthcare Providers: http://kim-miller.com/  This test is not yet approved or  cleared by the [place] FDA and has been authorized for detection and/or diagnosis of SARS-CoV-2 by FDA under an Emergency Use Authorization (EUA).  This EUA will remain in effect (meaning this test can be used) for the duration of the COVID-19 declaration under Section 564(b)(1) of the Act, 21 U.S.C. section 360bbb-3(b)(1), unless the authorization is terminated or revoked sooner.  Performed at [facility], [address], [place], [place] [address]   Blood Culture (routine x 2)     Status: None (Preliminary result)   Collection Time: 06/28/22  4:15 PM   Specimen: BLOOD  Result Value Ref Range Status   Specimen Description   Final    BLOOD SITE NOT SPECIFIED Performed at [facility], [address], [place], [place] [address]    Special Requests   Final    BOTTLES DRAWN AEROBIC ONLY Blood Culture adequate volume Performed at [facility], [address], [place], [place] [address]    Culture   Final    NO GROWTH 4 DAYS Performed at [facility], [address] [place], [place] [address]    Report Status PENDING  Incomplete  Blood Culture (routine x 2)     Status: None (Preliminary result)   Collection Time: 06/28/22  6:27 PM   Specimen: BLOOD  Result Value Ref Range Status   Specimen Description   Final    BLOOD RIGHT ANTECUBITAL Performed at [facility], [address], [place], [place] [address]    Special Requests   Final    BOTTLES DRAWN AEROBIC ONLY Blood Culture adequate volume Performed at [facility], [address] [place], [place] [address]    Culture   Final    NO GROWTH 4 DAYS Performed at [facility], [address] [place], [place] [address]    Report Status PENDING  Incomplete  Urine Culture     Status: None   Collection Time: 06/28/22  6:27 PM    Specimen: In/Out Cath Urine  Result Value Ref Range Status   Specimen Description   Final    IN/OUT CATH URINE Performed at [facility], [address], [place], [place] [address]    Special Requests   Final    NONE Performed at [facility], [address], [place], [place] [address]    Culture   Final    NO GROWTH Performed at [facility], [address], [place], [place] [address]    Report Status 06/29/2022 FINAL  Final     Medications:    amantadine  100 mg Oral BID   amLODipine  10 mg Oral Daily   baclofen  5-15 mg Oral BID   carvedilol  6.25 mg Oral BID WC   enoxaparin (LOVENOX) injection  1 mg/kg Subcutaneous Q12H   feeding supplement  1 Container Oral TID BM   food thickener  1 packet Oral TID WC & HS   gabapentin  100 mg Oral BID   losartan  25 mg Oral Daily   melatonin  3 mg Oral QHS   multivitamin with minerals  1 tablet Oral Daily   pantoprazole (PROTONIX) IV  40 mg Intravenous Q12H   podofilox   Topical BID   Continuous Infusions:  dextrose 5 % with KCl 20 mEq / L 20 mEq (07/03/22 0110)   fluconazole (DIFLUCAN) IV     penicillin G potassium 12 Million Units in dextrose 5 % 500 mL continuous infusion 12 Million Units (07/03/22 0215)      LOS: 4 days   Marinda Elk  [facility]  07/03/2022, 8:51 AM

## 2022-07-03 NOTE — Progress Notes (Signed)
Pt with history of PEs. Lovenox was dcd. RN placed a call to pharmacy and spoke with the pharmacist. She stated she was unaware of why the lovenox had been dcd and pt had not received any anticoagulation for 24 hours.  Pt with IVF infusing at . CT of abdomen showed increasing pleural effusions. Pt asymptomatic at this time. On call MD notified & aware. New orders received will continue to monitor.

## 2022-07-03 NOTE — Consult Note (Signed)
Referring Provider: David Stall, Darin Engels, MD Primary Care Physician:  Shayne Alken, MD Primary Gastroenterologist:  Gentry Fitz  Reason for Consultation:  Dysphagia  HPI: MARCILLE BARMAN is a 51 y.o. female with past medical history significant for nontraumatic intracranial hemorrhage, with spastic hemiplegia and cognitive deficits, bilateral PE in March of this year on Eliquis, HTN, who presented to the ED  06/28/22 for increased anxiety and decreased oral intake and increased worsening deficits since discharge from inpatient rehab in April, she was recently discharged on 06/26/2022 at which time she was treated empirically with IV Rocephin for SIRS but no source of infection found. Since discharge the patient has continued increased anxiety and restlessness not eating and drinking well. 2 out of 2 blood cultures from 06/23/2022 growing gram-positive rods.  Abdominal CT unrevealing. No acute findings on CT of the head.  GI is consulted for evaluation of dysphagia in patient with stroke history.  Unable to perform barium swallow and EGD requested.  Patient is on Eliquis at home.  Has been refusing medications and meals, poor oral intake.  Patient seen and examined this morning, daughter [name] at bedside.  Patient disoriented, in and out of sleep, and daughter provides history.  Patient lives with daughter at home and has some home health services.  Daughter states patient was eating well prior to this admission and was taking her medications at home, though reluctantly.  She was on some reflux medication at home but patient's daughter denies that patient has ever had issues with heartburn or acid reflux.  Denies family history of GI malignancy or disease.  Denies knowledge of past endoscopic procedures.  Patient's daughter states there were no complaints of abdominal pain at home, she was not having any vomiting.  Bowel movements normal.  Patient is on Eliquis at home, receiving  Lovenox during hospitalization.  She has history of stroke February 2023, no history of MI.  Daughter states patient last ate prior to hospitalization.  Per nursing note patient is having normal bowel movements, last BM yesterday.  Daughter states patient has been restless this admission.  There are orders for prn Haldol but nurse in room states patient has not received this morning.  Has received Dilaudid.  Per chart patient has had intermittent emesis, has been refusing oral intake.  Daughter reports patient has been speaking to her intermittently.  Throughout my visit this morning she speaks in short disjointed sentences, states she wants to sleep.   Past Medical History:  Diagnosis Date   Hypertension     Past Surgical History:  Procedure Laterality Date   BREAST LUMPECTOMY  1992   CESAREAN SECTION     IR GASTROSTOMY TUBE MOD SED  03/06/2022   ORIF RADIAL FRACTURE  02/10/2012   Procedure: OPEN REDUCTION INTERNAL FIXATION (ORIF) RADIAL FRACTURE;  Surgeon: Cammy Copa, MD;  Location: [facility] OR;  Service: Orthopedics;  Laterality: Right;   ORIF ULNAR FRACTURE  02/10/2012   Procedure: OPEN REDUCTION INTERNAL FIXATION (ORIF) ULNAR FRACTURE;  Surgeon: Cammy Copa, MD;  Location: [facility] OR;  Service: Orthopedics;  Laterality: Right;   TUBAL LIGATION      Prior to Admission medications   Medication Sig Start Date End Date Taking? Authorizing Provider  acetaminophen (TYLENOL) 500 MG tablet Take 500-1,000 mg by mouth every 6 (six) hours as needed for moderate pain.   Yes [provider]  amantadine (SYMMETREL) 100 MG capsule Take 1 capsule (100 mg total) by mouth 2 (two) times daily. 06/22/22  Yes Raulkar, Drema Pry, MD  amLODipine (NORVASC) 10 MG tablet Take 1 tablet (10 mg total) by mouth daily. 06/22/22  Yes Raulkar, Drema Pry, MD  apixaban (ELIQUIS) 5 MG TABS tablet Take 1 tablet (5 mg total) by mouth 2 (two) times daily. 06/22/22  Yes Raulkar, Drema Pry, MD  baclofen (LIORESAL) 10  MG tablet Take 5-15 mg by mouth 2 (two) times daily. Take 1/2 tablet (5 mg) and Take 1.5 tablets (15 mg) at bedtime 06/13/22  Yes [provider]  carvedilol (COREG) 6.25 MG tablet Take 1 tablet (6.25 mg total) by mouth 2 (two) times daily with a meal. 06/22/22  Yes Raulkar, Drema Pry, MD  famotidine (PEPCID) 20 MG tablet Take 1 tablet (20 mg total) by mouth 2 (two) times daily. Patient taking differently: Take 20 mg by mouth 2 (two) times daily as needed for heartburn or indigestion. 05/22/22  Yes Raulkar, Drema Pry, MD  gabapentin (NEURONTIN) 100 MG capsule Take 1 capsule (100 mg total) by mouth 2 (two) times daily. Patient taking differently: Take 100 mg by mouth 2 (two) times daily as needed (nerve pain). 05/24/22  Yes Raulkar, Drema Pry, MD  hydrALAZINE (APRESOLINE) 50 MG tablet Take 1 tablet (50 mg total) by mouth every 6 (six) hours. 06/22/22  Yes Raulkar, Drema Pry, MD  hydrOXYzine (ATARAX) 10 MG tablet Take 1 tablet (10 mg total) by mouth 3 (three) times daily as needed. Patient taking differently: Take 10 mg by mouth 3 (three) times daily as needed for anxiety. 06/09/22  Yes Raulkar, Drema Pry, MD  losartan (COZAAR) 25 MG tablet Take 1 tablet (25 mg total) by mouth daily. 06/09/22  Yes Raulkar, Drema Pry, MD  magnesium gluconate (MAGONATE) 500 MG tablet Take 1 tablet (500 mg total) by mouth at bedtime. 05/22/22  Yes Raulkar, Drema Pry, MD  Multiple Vitamin (MULTIVITAMIN WITH MINERALS) TABS tablet Take 1 tablet by mouth daily. 05/22/22  Yes Raulkar, Drema Pry, MD  potassium chloride (KLOR-CON) 20 MEQ packet Take 20 mEq by mouth daily. Patient taking differently: Take 20 mEq by mouth daily as needed (low potassium). 05/22/22  Yes Raulkar, Drema Pry, MD  sertraline (ZOLOFT) 50 MG tablet Take 1 tablet (50 mg total) by mouth at bedtime. Patient taking differently: Take 25 mg by mouth at bedtime. 06/22/22  Yes Raulkar, Drema Pry, MD  busPIRone (BUSPAR) 5 MG tablet Take 1 tablet (5 mg total) by mouth 3  (three) times daily as needed. 06/23/22   Raulkar, Drema Pry, MD  tiZANidine (ZANAFLEX) 4 MG tablet Take 1 tablet (4 mg total) by mouth at bedtime. Patient not taking: Reported on 06/23/2022 05/31/22   Horton Chin, MD    Scheduled Meds:  amantadine  100 mg Oral BID   amLODipine  10 mg Oral Daily   baclofen  5-15 mg Oral BID   carvedilol  6.25 mg Oral BID WC   enoxaparin (LOVENOX) injection  1 mg/kg Subcutaneous Q12H   feeding supplement  1 Container Oral TID BM   food thickener  1 packet Oral TID WC & HS   gabapentin  100 mg Oral BID   losartan  25 mg Oral Daily   melatonin  3 mg Oral QHS   multivitamin with minerals  1 tablet Oral Daily   pantoprazole (PROTONIX) IV  40 mg Intravenous Q12H   podofilox   Topical BID   Continuous Infusions:  dextrose 5 % with KCl 20 mEq / L 20 mEq (07/03/22 0110)   fluconazole (DIFLUCAN)  IV     penicillin G potassium 12 Million Units in dextrose 5 % 500 mL continuous infusion 12 Million Units (07/03/22 0215)   PRN Meds:.acetaminophen **OR** acetaminophen, famotidine, haloperidol lactate, hydrALAZINE, HYDROmorphone (DILAUDID) injection, labetalol, metoprolol tartrate, ondansetron **OR** ondansetron (ZOFRAN) IV, senna-docusate  Allergies as of 06/28/2022 - Review Complete 06/28/2022  Allergen Reaction Noted   Lisinopril Cough 10/26/2019    Family History  Problem Relation Age of Onset   Diabetes Mother    Hypertension Mother    Heart disease Mother    Diabetes Maternal Aunt    Stroke Maternal Uncle    Diabetes Maternal Grandmother    Heart disease Maternal Grandmother    Diabetes Maternal Grandfather    Heart disease Maternal Grandfather    Heart disease Paternal Grandmother    Heart disease Paternal Grandfather     Social History   Socioeconomic History   Marital status: Married    Spouse name: Not on file   Number of children: Not on file   Years of education: Not on file   Highest education level: Not on file  Occupational  History   Not on file  Tobacco Use   Smoking status: Never   Smokeless tobacco: Never  Vaping Use   Vaping Use: Never used  Substance and Sexual Activity   Alcohol use: Not Currently    Alcohol/week: 0.0 standard drinks of alcohol   Drug use: No   Sexual activity: Never  Other Topics Concern   Not on file  Social History Narrative   Not on file   Social Determinants of Health   Financial Resource Strain: Not on file  Food Insecurity: Not on file  Transportation Needs: Not on file  Physical Activity: Not on file  Stress: Not on file  Social Connections: Not on file  Intimate Partner Violence: Not on file    Review of Systems: Review of Systems  Unable to perform ROS: Acuity of condition     Physical Exam: Physical Exam Constitutional:      General: She is not in acute distress.    Appearance: She is not diaphoretic.  HENT:     Head: Normocephalic and atraumatic.     Right Ear: External ear normal.     Left Ear: External ear normal.     Nose: Nose normal.     Mouth/Throat:     Mouth: Mucous membranes are moist.     Pharynx: Oropharynx is clear.  Eyes:     General: No scleral icterus.    Conjunctiva/sclera: Conjunctivae normal.  Cardiovascular:     Rate and Rhythm: Normal rate and regular rhythm.     Pulses: Normal pulses.     Heart sounds: Normal heart sounds.  Pulmonary:     Effort: Pulmonary effort is normal.     Breath sounds: Normal breath sounds.  Abdominal:     General: Bowel sounds are normal. There is no distension.     Palpations: Abdomen is soft.     Tenderness: There is no abdominal tenderness. There is no guarding.  Musculoskeletal:     Cervical back: Normal range of motion and neck supple.     Right lower leg: No edema.     Left lower leg: No edema.  Skin:    General: Skin is warm and dry.  Neurological:     Mental Status: She is disoriented.  Psychiatric:     Comments: Restless, in and out of sleep     Vital signs: Vitals:  07/03/22  0026 07/03/22 0438  BP:  (!) 157/87  Pulse:  100  Resp:  17  Temp:  99.6 F (37.6 C)  SpO2: 94% 100%   Last BM Date : 07/02/22    GI:  Lab Results: Recent Labs    07/02/22 1929  WBC 11.6*  HGB 13.1  HCT 36.7  PLT 205   BMET Recent Labs    06/30/22 1022 07/02/22 1929  NA 147* 131*  K 3.3* 3.9  CL 120* 104  CO2 18* 20*  GLUCOSE 115* 162*  BUN 7 7  CREATININE 0.79 0.64  CALCIUM 11.1* 10.4*   LFT No results for input(s): "PROT", "ALBUMIN", "AST", "ALT", "ALKPHOS", "BILITOT", "BILIDIR", "IBILI" in the last 72 hours. PT/INR No results for input(s): "LABPROT", "INR" in the last 72 hours.   Studies/Results: CT HEAD WO CONTRAST ( )  Result Date: 07/03/2022 CLINICAL DATA:  Altered mental status. EXAM: CT HEAD WITHOUT CONTRAST TECHNIQUE: Contiguous axial images were obtained from the base of the skull through the vertex without intravenous contrast. RADIATION DOSE REDUCTION: This exam was performed according to the departmental dose-optimization program which includes automated exposure control, adjustment of the mA and/or kV according to patient size and/or use of iterative reconstruction technique. COMPARISON:  Head CT dated 06/28/2022. FINDINGS: Evaluation of this exam is limited due to motion artifact. Brain: Similar appearance the ventricles and sulci and mild white matter hypodensity. No large acute intracranial hemorrhage noted. Evaluation however is very limited, almost nondiagnostic due to severe motion artifact. No midline shift. Vascular: No acute findings. Skull: No acute fracture. Sinuses/Orbits: No air-fluid level. Other: None IMPRESSION: Very limited, almost nondiagnostic, exam due to severe motion artifact. No large acute intracranial hemorrhage. Electronically Signed   By: Elgie Collard M.D.   On: 07/03/2022 01:36   CT ABDOMEN PELVIS WO CONTRAST  Result Date: 07/02/2022 CLINICAL DATA:  Nausea and vomiting EXAM: CT ABDOMEN AND PELVIS WITHOUT CONTRAST  TECHNIQUE: Multidetector CT imaging of the abdomen and pelvis was performed following the standard protocol without IV contrast. Unenhanced CT was performed per clinician order. Lack of IV contrast limits sensitivity and specificity, especially for evaluation of abdominal/pelvic solid viscera. RADIATION DOSE REDUCTION: This exam was performed according to the departmental dose-optimization program which includes automated exposure control, adjustment of the mA and/or kV according to patient size and/or use of iterative reconstruction technique. COMPARISON:  03/09/2022 FINDINGS: Lower chest: Small bilateral pleural effusions, slightly increased since prior study. Minimal dependent lower lobe atelectasis. Trace pericardial fluid unchanged. Hepatobiliary: Stable cholelithiasis. No evidence of acute cholecystitis. Unremarkable unenhanced appearance of the liver. Pancreas: Unremarkable unenhanced appearance. Spleen: Unremarkable unenhanced appearance. Adrenals/Urinary Tract: No urinary tract calculi or obstructive uropathy. The adrenals are unremarkable. Bladder is decompressed, limiting its evaluation. Stomach/Bowel: No bowel obstruction or ileus. Normal appendix right lower quadrant. Scattered diverticulosis of the distal colon without evidence of acute diverticulitis. No bowel wall thickening or inflammatory change. Interval removal of the percutaneous gastrostomy tube seen previously. Vascular/Lymphatic: Aortic atherosclerosis. No enlarged abdominal or pelvic lymph nodes. Reproductive: Uterus and bilateral adnexa are unremarkable. Other: No free fluid or free intraperitoneal gas. No abdominal wall hernia. Musculoskeletal: No acute or destructive bony lesions. Reconstructed images demonstrate no additional findings. IMPRESSION: 1. Cholelithiasis without acute cholecystitis. 2. Small bilateral pleural effusions, slightly increased since prior study. 3. Distal colonic diverticulosis without diverticulitis. 4.  Aortic  Atherosclerosis (ICD10-I70.0). Electronically Signed   By: Sharlet Salina M.D.   On: 07/02/2022 15:46    Impression: Dysphagia - No  prior EGD or barium swallow study per daughter who states patient was tolerating oral intake at home - Has received diflucan though no evidence of oral thrush - CT abdomen pelvis showed no acute findings - Unable to perform barium swallow due to patient's mental status  Bacteremia - On 06/23/2022 blood cultures were positive for gram-positive rods 2 out of 2 surveillance blood cultures, surveillance blood cultures have been negative till date. - ID was consulted recommended to switch to oral amoxicillin to complete a 10-day course as an outpatient.   - Hemoglobin stable, 13.1, last CBC showed leukocytosis with WBC 11.6 (7/16) - BUN 7, creatinine 0.64  Acute metabolic encephalopathy - CT of the head showed no acute findings - Possibly due to underlying infection -Patient refusing oral medications, not eating at times  History of intracranial hemorrhage - With residual spastic hemiplegia and cognitive deficit. - CT of the head showed no acute findings.  History of PE - On Eliquis at home, receiving Lovenox injections every 12 hours this admission  Plan: Discussed possibility of EGD procedure with the patient's daughter for evaluation of patient's dysphagia.  Patient has no significant GI history and was tolerating oral intake prior to this admission.  No plan for procedure at this time but will discuss further with Dr. Dulce Sellar who will see patient and speak with patient's daughter later today.  Continue supportive care. Currently NPO. [facility] will follow.    LOS: 4 days   Berdine Dance  PA-C 07/03/2022, 8:48 AM  Contact #  [phone]

## 2022-07-03 NOTE — Progress Notes (Signed)
Daily Progress Note   Patient Name: Sarah Myers       Date: 07/03/2022 DOB: [birth year]  Age: 51 y.o. MRN#: [id number] Attending Physician: Marinda Elk, MD Primary Care Physician: Shayne Alken, MD Admit Date: 06/28/2022  Reason for Consultation/Follow-up: Establishing goals of care  Subjective: More awake alert, responds appropriately. Daughter [name] at bedside, states that the patient told her that she wants to get better. Daughter asking for repeat SLP evaluation and hopes that the patient will be able to have some diet resumed. EGD being considered.   Length of Stay: 4  Current Medications: Scheduled Meds:  . amantadine  100 mg Oral BID  . amLODipine  10 mg Oral Daily  . baclofen  5-15 mg Oral BID  . carvedilol  6.25 mg Oral BID WC  . enoxaparin (LOVENOX) injection  1 mg/kg Subcutaneous Q12H  . feeding supplement  1 Container Oral TID BM  . food thickener  1 packet Oral TID WC & HS  . gabapentin  100 mg Oral BID  . losartan  25 mg Oral Daily  . melatonin  3 mg Oral QHS  . multivitamin with minerals  1 tablet Oral Daily  . pantoprazole (PROTONIX) IV  40 mg Intravenous Q12H  . podofilox   Topical BID    Continuous Infusions: . sodium chloride 75 mL/hr at 07/03/22 0957  . sodium chloride    . fluconazole (DIFLUCAN) IV 100 mg (07/03/22 0959)  . penicillin G potassium 12 Million Units in dextrose 5 % 500 mL continuous infusion 12 Million Units (07/03/22 0215)    PRN Meds: sodium chloride, acetaminophen **OR** acetaminophen, famotidine, haloperidol lactate, hydrALAZINE, HYDROmorphone (DILAUDID) injection, labetalol, metoprolol tartrate, ondansetron **OR** ondansetron (ZOFRAN) IV, senna-docusate  Physical Exam         Awake alert Some what restless at  times Follow some commands and verbalizes some Regular work of breathing No edema Appears chronically ill  Vital Signs: BP (!) 157/87 (BP Location: Right Leg)   Pulse 100   Temp 99.6 F (37.6 C) (Axillary)   Resp 17   Ht 5\' 3"  (1.6 m)   Wt 56.3 kg   SpO2 100%   BMI 21.99 kg/m  SpO2: SpO2: 100 % O2 Device: O2 Device: Room Air O2 Flow Rate:    Intake/output summary:  Intake/Output  Summary (Last 24 hours) at 07/03/2022 1156 Last data filed at 07/03/2022 0451 Gross per 24 hour  Intake 2070.59 ml  Output 400 ml  Net 1670.59 ml   LBM: Last BM Date : 07/02/22 Baseline Weight: Weight: 56.3 kg Most recent weight: Weight: 56.3 kg       Palliative Assessment/Data:      Patient Active Problem List   Diagnosis Date Noted  . Acute encephalopathy 06/28/2022  . Bacteremia 06/28/2022  . Metabolic acidosis 06/28/2022  . Sepsis due to gram-negative UTI (HCC) 06/24/2022  . Essential hypertension 06/24/2022  . History of CVA (cerebrovascular accident) 06/24/2022  . History of pulmonary embolism 06/24/2022  . Anxiety and depression 06/24/2022  . Acute metabolic encephalopathy 06/24/2022  . Altered mental status 06/23/2022  . ICH (intracerebral hemorrhage) (HCC) 04/04/2022  . Hypercalcemia 03/21/2022  . Oral thrush 03/13/2022  . Dysphagia 03/08/2022  . Counseling regarding goals of care 03/08/2022  . Tracheostomy dependent (HCC)   . Bilateral pulmonary embolism (HCC)   . AKI (acute kidney injury) (HCC)   . Acute on chronic respiratory failure with hypoxia (HCC)   . Pressure injury of skin 02/07/2022  . 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/05/2022  . Iron deficiency anemia 06/30/2020  . Vitamin D deficiency 06/30/2020  . GAD (generalized anxiety disorder) 05/18/2020  . Malignant hypertension 10/30/2019    Palliative Care Assessment & Plan   Patient Profile:    Assessment:  51 yo with history of ICH, (was seen by PMT in goals of care  conversation in March 2023 and family had endorsed full code, full scope care at that time), patient with spastic hemiplegia and cognitive deficits bilateral PE, HTN.  Daughter [name] is her primary caregiver.  Patient remains admitted to hospital medicine service with acute metabolic encephalopathy, dysphagia, gram positive rod bacteremia.  PPS 40% Recommendations/Plan: Full code full scope care GI and SLP eval SNF rehab with palliative on discharge.     Code Status:    Code Status Orders  (From admission, onward)           Start     Ordered   06/28/22 1953  Full code  Continuous        06/28/22 1953           Code Status History     Date Active Date Inactive Code Status Order ID Comments User Context   06/23/2022 2206 06/26/2022 1812 Full Code [id number]  Hannah Beat, MD ED   04/04/2022 1526 04/26/2022 1812 Full Code [id number]  Milinda Antis, PA-C Inpatient   03/22/2022 0523 04/04/2022 1517 Full Code [id number]  Leane Para, CNA Inpatient   02/05/2022 0849 03/22/2022 0350 Full Code [id number]  Jefferson Fuel, MD ED   02/10/2012 1504 02/11/2012 1448 Full Code [id number]  Lorenso Quarry H Inpatient       Prognosis:  Guarded.   Discharge Planning: Skilled Nursing Facility for rehab with Palliative care service follow-up  Care plan was discussed with daughter [name].   Thank you for allowing the Palliative Medicine Team to assist in the care of this patient. MOD MDM.     Greater than 50%  of this time was spent counseling and coordinating care related to the above assessment and plan.  Rosalin Hawking, MD  Please contact Palliative Medicine Team phone at [phone] for questions and concerns.

## 2022-07-03 NOTE — Progress Notes (Signed)
Nutrition Follow-up  DOCUMENTATION CODES:   Not applicable  INTERVENTION:  - continue Boost Breeze TID.  - will order Ensure Plus High Protein BID, each supplement provides 350 kcal and 20 grams of protein.  - will order 1 tablet multivitamin with minerals/day.   - if PO intakes remain poor, recommend small bore NGT placement and initiation of tube feeding.   NUTRITION DIAGNOSIS:   Increased nutrient needs related to chronic illness as evidenced by estimated needs. -ongoing  GOAL:   Patient will meet greater than or equal to 90% of their needs -unmet  MONITOR:   PO intake, Supplement acceptance, Labs, Weight trends, I & O's  REASON FOR ASSESSMENT:   Consult Assessment of nutrition requirement/status  ASSESSMENT:   51 y.o. female past medical history significant for nontraumatic intracranial hemorrhage, with spastic hemiplegia and cognitive deficits, bilateral PE in March of this year on Eliquis, essential hypertension now comes in to the ED for increased anxiety and decreased oral intake and increased worsening deficits since discharge from inpatient rehab in April, she was recently discharged on 06/26/2022 for which she was treated empirically with IV Rocephin for SIRS but no source of infection found.  Patient noted to be a/o to self only. Patient laying in bed with eyes closed and no visitors at the time of RD visit. RN at bedside. Lunch tray in the room and untouched. RN offered ice cream to patient and she declined. She requested to be left alone and stated she did not want anything to eat at that time.  On 7/14 she was ordered Dysphagia 2, nectar-thick liquids. She was made NPO yesterday at 1700 and diet re-advanced to the same today at 1315. Flow sheet documentation indicates patient ate 50% of dinner on 7/14 and 0% of all other recorded meals since that time.   SLP saw patient earlier today and recommendation was to continue current diet order.  Patient remains Full  Code.  She has not been weighed since admission on 7/13. Non-pitting edema to all extremities documented in the edema section of flow sheet.    Labs reviewed; CBG: 137 mg/dl, Na: 761 mmol/l, Ca: 60.7 mg/dl.  Medications reviewed; 20 mg oral pepcid BID, 3 mg melatonin/night, 1 tablet multivitamin with minerals/day, 40 mg IV protonix BID.  IVF; 1/2 NS @ 75 ml/hr.     NUTRITION - FOCUSED PHYSICAL EXAM:  Patient requests to be left alone at this time.  Diet Order:   Diet Order             DIET DYS 2 Room service appropriate? Yes; Fluid consistency: Nectar Thick  Diet effective now                   EDUCATION NEEDS:   Not appropriate for education at this time  Skin:  Skin Assessment: Reviewed RN Assessment  Last BM:  7/17 (type 3 x2, both small amounts)  Height:   Ht Readings from Last 1 Encounters:  06/29/22 5\' 3"  (1.6 m)    Weight:   Wt Readings from Last 1 Encounters:  06/29/22 56.3 kg     BMI:  Body mass index is 21.99 kg/m.  Estimated Nutritional Needs:  Kcal:  1650-1850 Protein:  80-95g Fluid:  1.9L/day      07/01/22, MS, RD, LDN, CNSC Registered Dietitian II Inpatient Clinical Nutrition RD pager # and on-call/weekend pager # available in [facility]ION

## 2022-07-03 NOTE — Progress Notes (Signed)
   07/02/22 2310 07/03/22 0025  Vitals  Temp  --  99.8 F (37.7 C)  Temp Source  --  Oral  BP  --  124/84  MAP (mmHg)  --  96  BP Location  --  Right Arm  BP Method  --  Automatic  Patient Position (if appropriate)  --  Lying  Pulse Rate  --  94  Pulse Rate Source  --  Dinamap  Resp  --  15  Level of Consciousness  Level of Consciousness Responds to Voice Responds to Voice  MEWS COLOR  MEWS Score Color Yellow Green  Oxygen Therapy  SpO2  --  90 %  O2 Device  --  Room Air  Pain Assessment  Pain Scale 0-10  --   Pain Score 0  --   Glasgow Coma Scale  Eye Opening 4  --   Best Verbal Response (NON-intubated) 3  --   Best Motor Response 6  --   Glasgow Coma Scale Score 13  --   MEWS Score  MEWS Temp 0 0  MEWS Systolic 0 0  MEWS Pulse 1 0  MEWS RR 0 0  MEWS LOC 1 1  MEWS Score 2 1   Kakrakandy MD notified of LOC. New orders received.

## 2022-07-03 NOTE — Progress Notes (Signed)
Pt lethargic & drowsy. Pt is arousable to pain, voice & touch but immediately falls back asleep. On call provider notified & aware. New orders received. Will continue to monitor.

## 2022-07-03 NOTE — Significant Event (Signed)
Patient was noticed to be increasingly lethargic more than her baseline as observed by patient's nurse.  On exam at bedside patient is arousable.  Vitals noted.  Reviewed patient's chart and medications and labs.  ABG does not show any carbon dioxide retention.  CBGs more than 100.  Stat CT head was done which did not show any acute findings.  We will continue to observe.  Midge Minium

## 2022-07-03 NOTE — TOC Progression Note (Signed)
Transition of Care [facility]) - Progression Note    Patient Details  Name: Sarah Myers MRN: [id number] Date of Birth: Jun 24, 1971  Transition of Care [facility]) CM/SW Contact  Otelia Santee, LCSW Phone Number: 07/03/2022, 4:11 PM  Clinical Narrative:    Pt's family have viewed SNF facilities and have accepted bed offer for [facility]. CSW has contacted [facility] and requested insurance auth to be started.    Expected Discharge Plan: Skilled Nursing Facility Barriers to Discharge: Continued Medical Work up  Expected Discharge Plan and Services Expected Discharge Plan: Skilled Nursing Facility In-house Referral: Clinical Social Work Discharge Planning Services: CM Consult Post Acute Care Choice: Skilled Nursing Facility Living arrangements for the past 2 months: Apartment                 DME Arranged: N/A DME Agency: NA                   Social Determinants of Health (SDOH) Interventions    Readmission Risk Interventions    06/30/2022   12:52 PM 03/09/2022    9:54 AM  Readmission Risk Prevention Plan  Transportation Screening Complete Complete  PCP or Specialist Appt within 5-7 Days Complete   PCP or Specialist Appt within 3-5 Days  Complete  Home Care Screening Complete   Medication Review (RN CM) Complete   HRI or Home Care Consult  Not Complete  Social Work Consult for Recovery Care Planning/Counseling  Complete  Medication Review Oceanographer)  Complete

## 2022-07-04 DIAGNOSIS — G9340 Encephalopathy, unspecified: Secondary | ICD-10-CM | POA: Diagnosis not present

## 2022-07-04 DIAGNOSIS — G9341 Metabolic encephalopathy: Secondary | ICD-10-CM | POA: Diagnosis not present

## 2022-07-04 DIAGNOSIS — R7881 Bacteremia: Secondary | ICD-10-CM | POA: Diagnosis not present

## 2022-07-04 DIAGNOSIS — R1319 Other dysphagia: Secondary | ICD-10-CM | POA: Diagnosis not present

## 2022-07-04 MED ORDER — AMOXICILLIN 250 MG/5ML PO SUSR
500.0000 mg | Freq: Two times a day (BID) | ORAL | Status: DC
  Filled 2022-07-04: qty 10

## 2022-07-04 MED ORDER — AMOXICILLIN 250 MG/5ML PO SUSR
500.0000 mg | Freq: Three times a day (TID) | ORAL | Status: DC
  Administered 2022-07-04 – 2022-07-05 (×4): 500 mg via ORAL
  Filled 2022-07-04 (×4): qty 10

## 2022-07-04 MED ORDER — SODIUM CHLORIDE 0.45 % IV SOLN: INTRAVENOUS | Status: AC

## 2022-07-04 NOTE — Progress Notes (Signed)
Discussed with daughter that PT requested a palliative care eval, daughter notes that patient is still currently admitted in the hospital, agreed to discuss further together once she is discharged.

## 2022-07-04 NOTE — Progress Notes (Signed)
PT Cancellation Note  Patient Details Name: SAMEERAH NACHTIGAL MRN: [id number] DOB: 08/28/1971   Cancelled Treatment:    Reason Eval/Treat Not Completed: Attempted PT tx session-pt declined to participate despite encouragement from therapist and family. Will check back another day.    Faye Ramsay, PT Acute Rehabilitation  Office: [phone] Pager: [phone]

## 2022-07-04 NOTE — TOC Progression Note (Signed)
Transition of Care [facility]) - Progression Note    Patient Details  Name: Sarah Myers MRN: [id number] Date of Birth: [birth year]  Transition of Care [facility]) CM/SW Contact  Otelia Santee, LCSW Phone Number: 07/04/2022, 10:42 AM  Clinical Narrative:    Currently awaiting insurance authorization for SNF placement. Per MD if pt able to take medications consistently may discharge on 07/05/2022.   Expected Discharge Plan: Skilled Nursing Facility Barriers to Discharge: Continued Medical Work up  Expected Discharge Plan and Services Expected Discharge Plan: Skilled Nursing Facility In-house Referral: Clinical Social Work Discharge Planning Services: CM Consult Post Acute Care Choice: Skilled Nursing Facility Living arrangements for the past 2 months: Apartment                 DME Arranged: N/A DME Agency: NA                   Social Determinants of Health (SDOH) Interventions    Readmission Risk Interventions    06/30/2022   12:52 PM 03/09/2022    9:54 AM  Readmission Risk Prevention Plan  Transportation Screening Complete Complete  PCP or Specialist Appt within 5-7 Days Complete   PCP or Specialist Appt within 3-5 Days  Complete  Home Care Screening Complete   Medication Review (RN CM) Complete   HRI or Home Care Consult  Not Complete  Social Work Consult for Recovery Care Planning/Counseling  Complete  Medication Review Oceanographer)  Complete

## 2022-07-04 NOTE — Progress Notes (Signed)
Scheduled meds given late but pt was able to take whole as writer placed it in her mouth. She was able to follow commands of open mouth and take a drink. She followed each pill with sips of thickened coke.   Mouth checked afterwards and no pocketing noted. No swallowing difficulties noted.  She slept for a few hours making it difficult to administer afternoon meds but when she woke up she was able to follow commands and opened mouth to take liquid antibiotic. She followed it with sips of thickened coke.   Offered pt something to eat and lunch tray was left in room until she could wake up. She replied, "I don't want any of that nasty stuff. I want some of that" as she pointed to daughter's chick fil A bag.   Given 1 chicken nugget that was cut in 1/3 and given each piece one at a time. She chewed it for awhile and then given sips of thickened coke. She repeated 2 more times and then her mouth was checked for pocketing; none noted.   Pt did state, "Bring Tannie over here so I can kick her butt." When asked why she replied, "cause she didn't bring me anything to eat."  Spoke with daughter about giving thickened drink to mother. Daughter able to verbalize to raise head of bed and give small sips until pt could state she wanted some more. She verbalizes understanding.

## 2022-07-04 NOTE — Progress Notes (Signed)
Chaplain engaged in an initial visit with Sarah Myers, her mom, daughter, and uncle.  Chaplain was able to provide reflective listening to [name]'s daughter and caregiver as she shared her frustrations and thoughts around [name] care.  Daughter voiced being tired of hearing the same thing over and over concerning having family conversations concerning her mom's care.  She stated recognizing that her mom may need long-term help or may not recover.  Right now she finds herself in a place of wanting to do everything possible to see if her mom can progress and recover.  She noted that her mom has been eating.  Daughter was vocal about wanting to see the medical team try everything possible before talking about long-term care facilities or preparing for her mom's death.    Chaplain worked to affirm daughter's love for her mom.  Daughter stated that she is 50 years old and is the primary caregiver for [name].  Family also confirmed that she is [name]'s sole caregiver.  Chaplain could assess that the weight of such a responsibility has been hard on her.  Daughter expressed having anxiety and it being hard to make so many decisions.  She also voiced that her mom would do anything for her so she wants to do all that she can for [name].  Chaplain worked to normalize daughter's care and love also being existent in knowing when she needs more care and more help.  Chaplain affirmed that going to a rehab or facility doesn't take away that love that she has for her mom.  Daughter appeared to be in a struggle of letting her mom come home or seeing her mom go to a facility.  She desires to take care of her but also recognizes her limits.  Daughter stated that because they live on the second floor, she is unable to get [name] to any appointments.  She continues to reschedule those appointments because she can't transport her.  Daughter could verbalize that her mom's condition has changed a lot and that it has been hard for her to take  care of her needs.  Chaplain assessed throughout visit that [name]'s daughter may be unable to make pertinent decisions for her mom.  Daughter did share that she is working alongside her sister to make decisions but it appears that the weight of care is left on [name]'s 66 year old daughter.  She feels pulled in a number of directions and appears to have trouble assessing what may be the safest choice for her mom.  Chaplain worked to offer reflective listening, pose important questions, serve as a Armed forces training and education officer and family, and offer support.     07/04/22 1700  Clinical Encounter Type  Visited With Patient and family together  Visit Type Initial

## 2022-07-04 NOTE — Progress Notes (Addendum)
PHARMACY NOTE:  ANTIMICROBIAL RENAL DOSAGE ADJUSTMENT  Current antimicrobial regimen includes a mismatch between antimicrobial dosage and estimated renal function.  As per policy approved by the Pharmacy & Therapeutics and Medical Executive Committees, the antimicrobial dosage will be adjusted accordingly.  Current antimicrobial dosage:  amoxicillin 500mg  PO q12 hours  Indication: Gram-positive rod bacteremia  Renal Function:  Estimated Creatinine Clearance: 70.4 mL/min (by C-G formula based on SCr of 0.64 mg/dL). []      On intermittent HD, scheduled: []      On CRRT    Antimicrobial dosage has been changed to:  amoxicillin 500mg  PO q8 hours  Additional comments: stop date added for 07/07/22 per ID recommendations   Thank you for allowing pharmacy to be a part of this patient's care.  , PharmD, BCPS 07/04/2022 9:47 AM

## 2022-07-04 NOTE — Progress Notes (Addendum)
[facility] PROGRESS NOTE    Progress Note  Sarah Myers  ELF:[id number] DOB: Sep 07, 1971 DOA: 06/28/2022 PCP: Shayne Alken, MD     Brief Narrative:   Sarah Myers is an 51 y.o. female past medical history significant for nontraumatic intracranial hemorrhage, with spastic hemiplegia and cognitive deficits, bilateral PE in March of this year on Eliquis, essential hypertension now comes in to the ED for increased anxiety and decreased oral intake and increased worsening deficits since discharge from inpatient rehab in April, she was recently discharged on 06/26/2022 for which she was treated empirically with IV Rocephin for SIRS but no source of infection found.  Right since discharge the patient has continued increased anxiety and restlessness not eating and drinking well 2 out of 2 blood cultures from 06/23/2022 is growing gram-positive rods  Assessment/Plan:   Significant cognitive deficit due to her previous history of intracranial hemorrhage According to the daughter the patient has had poor oral intake. CT of the head showed no acute findings. Patient is refusing all of her medications and not eating at times. Palliative care is not making any progress with family. They still want the patient to be a full code.  Dysphagia: According to the daughter the patient is complaining of dysphagia. CT scan of the abdomen pelvis showed no acute findings. GI was consulted recommended no invasive intervention. Speech evaluated patient recommended dysphagia 2 diet. According to the daughter she ate dinner yesterday, watch for an additional 24 hours to see if she is able to eat and drink and take her medications (it is to note that she has been refusing her medications intermittently). If she is able to take her medications consistently, she can probably be discharged on 07/05/2022.  Gram-positive rod bacteremia: On 06/23/2022 blood cultures were positive for gram-positive  rods 2 out of 2 surveillance blood cultures, surveillance blood cultures have been negative till date. ID was consulted recommended to switch to oral amoxicillin to complete a 10-day course as an outpatient.   Transition to oral amoxicillin liquid form for an additional 3 days.  History of intracranial hemorrhage: With residual spastic hemiplegia and cognitive deficit. CT of the head showed no acute findings. Patient daughter relates she is unable to care for her mom despite home health services she would like skilled nursing facility placement. Physical therapy recommended skilled nursing facility placement.  Hypercalcemia: Likely due to dehydration continue with fluid resuscitation.  History of PE: Continue Eliquis.  Anxiety: Continue Zoloft and hydroxyzine.  Essential hypertension: Patient continues to be elevated she continues to refuse her oral antihypertensive medication, she is currently on IV hydralazine and IV labetalol. We will have to discuss with the daughter moving towards hospice care.  Normal anion gap metabolic acidosis: Resolving with fluid resuscitation.  Hypokalemia: Replete orally recheck in the morning.   DVT prophylaxis: lovenox Family Communication:daughter Status is: Observation The patient will require care spanning > 2 midnights and should be moved to inpatient because: Acute metabolic encephalopathy probably due to bacteremia    Code Status:     Code Status Orders  (From admission, onward)           Start     Ordered   06/28/22 1953  Full code  Continuous        06/28/22 1953           Code Status History     Date Active Date Inactive Code Status Order ID Comments User Context   06/23/2022 2206 06/26/2022 1812  Full Code [id number]  Arville Care Vernetta Honey, MD ED   04/04/2022 1526 04/26/2022 1812 Full Code [id number]  Carlos Levering Inpatient   03/22/2022 0523 04/04/2022 1517 Full Code [id number]  Leane Para, CNA Inpatient   02/05/2022 0849  03/22/2022 0350 Full Code [id number]  Jefferson Fuel, MD ED   02/10/2012 1504 02/11/2012 1448 Full Code [id number]  Lorenso Quarry H Inpatient         IV Access:   Peripheral IV   Procedures and diagnostic studies:   CT HEAD WO CONTRAST ( )  Result Date: 07/03/2022 CLINICAL DATA:  Altered mental status. EXAM: CT HEAD WITHOUT CONTRAST TECHNIQUE: Contiguous axial images were obtained from the base of the skull through the vertex without intravenous contrast. RADIATION DOSE REDUCTION: This exam was performed according to the departmental dose-optimization program which includes automated exposure control, adjustment of the mA and/or kV according to patient size and/or use of iterative reconstruction technique. COMPARISON:  Head CT dated 06/28/2022. FINDINGS: Evaluation of this exam is limited due to motion artifact. Brain: Similar appearance the ventricles and sulci and mild white matter hypodensity. No large acute intracranial hemorrhage noted. Evaluation however is very limited, almost nondiagnostic due to severe motion artifact. No midline shift. Vascular: No acute findings. Skull: No acute fracture. Sinuses/Orbits: No air-fluid level. Other: None IMPRESSION: Very limited, almost nondiagnostic, exam due to severe motion artifact. No large acute intracranial hemorrhage. Electronically Signed   By: Elgie Collard M.D.   On: 07/03/2022 01:36   CT ABDOMEN PELVIS WO CONTRAST  Result Date: 07/02/2022 CLINICAL DATA:  Nausea and vomiting EXAM: CT ABDOMEN AND PELVIS WITHOUT CONTRAST TECHNIQUE: Multidetector CT imaging of the abdomen and pelvis was performed following the standard protocol without IV contrast. Unenhanced CT was performed per clinician order. Lack of IV contrast limits sensitivity and specificity, especially for evaluation of abdominal/pelvic solid viscera. RADIATION DOSE REDUCTION: This exam was performed according to the departmental dose-optimization program which includes automated  exposure control, adjustment of the mA and/or kV according to patient size and/or use of iterative reconstruction technique. COMPARISON:  03/09/2022 FINDINGS: Lower chest: Small bilateral pleural effusions, slightly increased since prior study. Minimal dependent lower lobe atelectasis. Trace pericardial fluid unchanged. Hepatobiliary: Stable cholelithiasis. No evidence of acute cholecystitis. Unremarkable unenhanced appearance of the liver. Pancreas: Unremarkable unenhanced appearance. Spleen: Unremarkable unenhanced appearance. Adrenals/Urinary Tract: No urinary tract calculi or obstructive uropathy. The adrenals are unremarkable. Bladder is decompressed, limiting its evaluation. Stomach/Bowel: No bowel obstruction or ileus. Normal appendix right lower quadrant. Scattered diverticulosis of the distal colon without evidence of acute diverticulitis. No bowel wall thickening or inflammatory change. Interval removal of the percutaneous gastrostomy tube seen previously. Vascular/Lymphatic: Aortic atherosclerosis. No enlarged abdominal or pelvic lymph nodes. Reproductive: Uterus and bilateral adnexa are unremarkable. Other: No free fluid or free intraperitoneal gas. No abdominal wall hernia. Musculoskeletal: No acute or destructive bony lesions. Reconstructed images demonstrate no additional findings. IMPRESSION: 1. Cholelithiasis without acute cholecystitis. 2. Small bilateral pleural effusions, slightly increased since prior study. 3. Distal colonic diverticulosis without diverticulitis. 4.  Aortic Atherosclerosis (ICD10-I70.0). Electronically Signed   By: Sharlet Salina M.D.   On: 07/02/2022 15:46     Medical Consultants:   None.   Subjective:    [name] this morning still nonverbal..  Objective:    Vitals:   07/03/22 1329 07/03/22 1458 07/03/22 2003 07/04/22 0328  BP: (!) 148/83 132/83 130/82 (!) 141/89  Pulse: 95 91 80 94  Resp: (!) 21  18 16  Temp: 97.7 F (36.5 C)  98.6 F (37 C)  98.9 F (37.2 C)  TempSrc: Oral  Oral Oral  SpO2: 96%  98% 99%  Weight:      Height:       SpO2: 99 %   Intake/Output Summary (Last 24 hours) at 07/04/2022 0912 Last data filed at 07/03/2022 2005 Gross per 24 hour  Intake 0 ml  Output --  Net 0 ml    Filed Weights   06/29/22 1711  Weight: 56.3 kg    Exam: General exam: In no acute distress. Respiratory system: Good air movement and clear to auscultation. Cardiovascular system: S1 & S2 heard, RRR. No JVD. Gastrointestinal system: Abdomen is nondistended, soft and nontender.  Extremities: No pedal edema. Skin: No rashes, lesions or ulcers Psychiatry: No judgment or insight of medical condition.  Data Reviewed:    Labs: Basic Metabolic Panel: Recent Labs  Lab 06/28/22 1615 06/29/22 0528 06/30/22 1022 07/02/22 1929  NA 145 145 147* 131*  K 3.6 3.0* 3.3* 3.9  CL 118* 115* 120* 104  CO2 14* 19* 18* 20*  GLUCOSE 104* 125* 115* 162*  BUN 9 9 7 7   CREATININE 0.82 0.68 0.79 0.64  CALCIUM 11.8* 10.9* 11.1* 10.4*    GFR Estimated Creatinine Clearance: 70.4 mL/min (by C-G formula based on SCr of 0.64 mg/dL). Liver Function Tests: Recent Labs  Lab 06/28/22 1615  AST 24  ALT 25  ALKPHOS 72  BILITOT 0.9  PROT 7.7  ALBUMIN 4.1    No results for input(s): "LIPASE", "AMYLASE" in the last 168 hours. Recent Labs  Lab 06/29/22 0645  AMMONIA 25    Coagulation profile No results for input(s): "INR", "PROTIME" in the last 168 hours.  COVID-19 Labs  No results for input(s): "DDIMER", "FERRITIN", "LDH", "CRP" in the last 72 hours.  Lab Results  Component Value Date   SARSCOV2NAA NEGATIVE 06/23/2022   SARSCOV2NAA NEGATIVE 02/05/2022    CBC: Recent Labs  Lab 06/28/22 1615 06/29/22 0528 07/02/22 1929  WBC 7.3 6.8 11.6*  NEUTROABS 4.7  --  9.4*  HGB 13.1 12.5 13.1  HCT 39.2 35.8* 36.7  MCV 83.9 81.9 78.6*  PLT 196 178 205    Cardiac Enzymes: No results for input(s): "CKTOTAL", "CKMB", "CKMBINDEX",  "TROPONINI" in the last 168 hours. BNP (last 3 results) No results for input(s): "PROBNP" in the last 8760 hours. CBG: Recent Labs  Lab 07/01/22 1237 07/03/22 0049  GLUCAP 128* 137*    D-Dimer: No results for input(s): "DDIMER" in the last 72 hours. Hgb A1c: No results for input(s): "HGBA1C" in the last 72 hours. Lipid Profile: No results for input(s): "CHOL", "HDL", "LDLCALC", "TRIG", "CHOLHDL", "LDLDIRECT" in the last 72 hours. Thyroid function studies: No results for input(s): "TSH", "T4TOTAL", "T3FREE", "THYROIDAB" in the last 72 hours.  Invalid input(s): "FREET3"  Anemia work up: No results for input(s): "VITAMINB12", "FOLATE", "FERRITIN", "TIBC", "IRON", "RETICCTPCT" in the last 72 hours.  Sepsis Labs: Recent Labs  Lab 06/28/22 1615 06/28/22 1827 06/28/22 2221 06/29/22 0528 07/02/22 1929  PROCALCITON  --   --  <0.10  --   --   WBC 7.3  --   --  6.8 11.6*  LATICACIDVEN 0.7 0.7  --   --   --     Microbiology Recent Results (from the past 240 hour(s))  Blood Culture (routine x 2)     Status: None   Collection Time: 06/28/22  4:15 PM   Specimen: BLOOD  Result Value Ref  Range Status   Specimen Description   Final    BLOOD SITE NOT SPECIFIED Performed at [facility], [address] [place], [place] [address]    Special Requests   Final    BOTTLES DRAWN AEROBIC ONLY Blood Culture adequate volume Performed at [facility], [address] [place], [place] [address]    Culture   Final    NO GROWTH 5 DAYS Performed at [facility], [address] [place], [place] [address]    Report Status 07/03/2022 FINAL  Final  Blood Culture (routine x 2)     Status: None   Collection Time: 06/28/22  6:27 PM   Specimen: BLOOD  Result Value Ref Range Status   Specimen Description   Final    BLOOD RIGHT ANTECUBITAL Performed at [facility], [address], [place], [place] [address]    Special Requests   Final     BOTTLES DRAWN AEROBIC ONLY Blood Culture adequate volume Performed at [facility], [address] [place], [place] [address]    Culture   Final    NO GROWTH 5 DAYS Performed at [facility], [address] [place], [place] [address]    Report Status 07/03/2022 FINAL  Final  Urine Culture     Status: None   Collection Time: 06/28/22  6:27 PM   Specimen: In/Out Cath Urine  Result Value Ref Range Status   Specimen Description   Final    IN/OUT CATH URINE Performed at [facility], [address] [place], [place] [address]    Special Requests   Final    NONE Performed at [facility], [address], [place], [place] [address]    Culture   Final    NO GROWTH Performed at [facility], [address], [place], [place] [address]    Report Status 06/29/2022 FINAL  Final     Medications:    amantadine  100 mg Oral BID   amLODipine  10 mg Oral Daily   baclofen  5-15 mg Oral BID   carvedilol  6.25 mg Oral BID WC   enoxaparin (LOVENOX) injection  1 mg/kg Subcutaneous Q12H   feeding supplement  1 Container Oral TID BM   feeding supplement  237 mL Oral BID BM   food thickener  1 packet Oral TID WC & HS   gabapentin  100 mg Oral BID   losartan  25 mg Oral Daily   melatonin  3 mg Oral QHS   multivitamin with minerals  1 tablet Oral Daily   pantoprazole (PROTONIX) IV  40 mg Intravenous Q12H   podofilox   Topical BID   Continuous Infusions:  sodium chloride 75 mL/hr at 07/04/22 0128   sodium chloride     fluconazole (DIFLUCAN) IV 100 mg (07/03/22 0959)   penicillin G potassium 12 Million Units in dextrose 5 % 500 mL continuous infusion 12 Million Units (07/04/22 0129)      LOS: 5 days   Marinda Elk  [facility]  07/04/2022, 9:12 AM

## 2022-07-05 DIAGNOSIS — R1319 Other dysphagia: Secondary | ICD-10-CM | POA: Diagnosis not present

## 2022-07-05 MED ORDER — PANTOPRAZOLE SODIUM 40 MG PO TBEC: 40.0000 mg | DELAYED_RELEASE_TABLET | Freq: Two times a day (BID) | ORAL | Status: DC

## 2022-07-05 MED ORDER — APIXABAN 5 MG PO TABS
5.0000 mg | ORAL_TABLET | Freq: Two times a day (BID) | ORAL | Status: DC
  Administered 2022-07-05: 5 mg via ORAL
  Filled 2022-07-05: qty 1

## 2022-07-05 MED ORDER — AMOXICILLIN 250 MG/5ML PO SUSR: 500.0000 mg | Freq: Three times a day (TID) | ORAL | 0 refills | Status: DC

## 2022-07-05 NOTE — Progress Notes (Signed)
1330 PTAR here to pick up patient via stretcher and take her to SNF. Dgt Yazmin Locher has all personal belongings. Pt discharged with VS WNL, NAD at time of D/C.

## 2022-07-05 NOTE — Discharge Summary (Signed)
Physician Discharge Summary  Sarah Myers YVO:[id number] DOB: Mar 09, 1971 DOA: 06/28/2022  PCP: Shayne Alken, MD  Admit date: 06/28/2022 Discharge date: 07/05/2022  Admitted From: home Disposition:  SNF  Recommendations for Outpatient Follow-up:  Follow up with PCP in 1-2 weeks Please obtain BMP/CBC in one week  Home Health: none Equipment/Devices: none  Discharge Condition: stable CODE STATUS: Full code Diet Orders (From admission, onward)     Start     Ordered   07/03/22 1316  DIET DYS 2 Room service appropriate? Yes; Fluid consistency: Nectar Thick  Diet effective now       Question Answer Comment  Room service appropriate? Yes   Fluid consistency: Nectar Thick      07/03/22 1315            HPI: Per admitting MD, [name] is a pleasant 52 y.o. 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  Patient complains of pain all over. She is cared for at home by her daughter who has noted worsening cognitive deficits since returning home from inpatient rehab in April, seem to worsen significantly 2 weeks ago and was hospitalized from 06/23/2022 until 06/26/2022.  She was febrile when she presented on 06/23/2022, was started on Rocephin for suspected UTI, completed 3 days of Rocephin and then discharged with 3 days of Omnicef though she was not having urinary symptoms and urine culture grew multiple species.  Since returning home, the patient has continued to have increased anxiety and restlessness, and is not eating or drinking consistently despite her daughter's encouragement.  1 blood culture bottle from 06/23/2022 is now growing gram-positive bacilli.   Hospital Course / Discharge  diagnoses: Principal Problem:   Dysphagia Active Problems:   Malignant hypertension   GAD (generalized anxiety disorder)   ICH (intracerebral hemorrhage) (HCC)   History of pulmonary embolism   Anxiety and depression   Acute metabolic encephalopathy   Acute encephalopathy   Bacteremia   Metabolic acidosis   Palliative care by specialist   Goals of care, counseling/discussion  Principal problem Significant cognitive deficit due to her previous history of intracranial hemorrhage -According to the daughter the patient has had poor oral intake and a more decline recently. Continue supportive care / rehab as tolerated   Active problems Dysphagia-According to the daughter the patient is complaining of dysphagia. CT scan of the abdomen pelvis showed no acute findings.GI was consulted recommended no invasive intervention. Speech evaluated patient recommended dysphagia 2 diet. Seems to be tolerating a diet and pills Gram-positive rod bacteremia-On 06/23/2022 blood cultures were positive for gram-positive rods 2 out of 2 surveillance blood cultures, surveillance blood cultures have been negative till date. ID was consulted recommended to switch to oral amoxicillin to complete a 10-day course as an outpatient. Transition to oral amoxicillin liquid form for an additional 2 days. History of intracranial hemorrhage-With residual spastic hemiplegia and cognitive deficit. CT of the head showed no acute findings. Hypercalcemia-Likely due to dehydration. Improved with fluids History of PE-Continue Eliquis. Anxiety-Continue home medications Essential hypertension-resume home regimen Hypokalemia-Repleted  Sepsis ruled out   Discharge Instructions   Allergies as of 07/05/2022       Reactions   Lisinopril Cough        Medication List     STOP  taking these medications    cefdinir 300 MG capsule Commonly known as: OMNICEF   podofilox 0.5 % external solution Commonly known as: CONDYLOX    potassium chloride 20 MEQ packet Commonly known as: KLOR-CON       TAKE these medications    acetaminophen 500 MG tablet Commonly known as: TYLENOL Take 500-1,000 mg by mouth every 6 (six) hours as needed for moderate pain.   amantadine 100 MG capsule Commonly known as: SYMMETREL Take 1 capsule (100 mg total) by mouth 2 (two) times daily.   amLODipine 10 MG tablet Commonly known as: NORVASC Take 1 tablet (10 mg total) by mouth daily.   amoxicillin 250 MG/5ML suspension Commonly known as: AMOXIL Take 10 mLs (500 mg total) by mouth every 8 (eight) hours.   apixaban 5 MG Tabs tablet Commonly known as: ELIQUIS Take 1 tablet (5 mg total) by mouth 2 (two) times daily.   baclofen 10 MG tablet Commonly known as: LIORESAL Take 5-15 mg by mouth 2 (two) times daily. Take 1/2 tablet (5 mg) and Take 1.5 tablets (15 mg) at bedtime   busPIRone 5 MG tablet Commonly known as: BUSPAR Take 1 tablet (5 mg total) by mouth 3 (three) times daily as needed.   carvedilol 6.25 MG tablet Commonly known as: COREG Take 1 tablet (6.25 mg total) by mouth 2 (two) times daily with a meal.   famotidine 20 MG tablet Commonly known as: PEPCID Take 1 tablet (20 mg total) by mouth 2 (two) times daily. What changed:  when to take this reasons to take this   gabapentin 100 MG capsule Commonly known as: NEURONTIN Take 1 capsule (100 mg total) by mouth 2 (two) times daily. What changed:  when to take this reasons to take this   hydrALAZINE 50 MG tablet Commonly known as: APRESOLINE Take 1 tablet (50 mg total) by mouth every 6 (six) hours.   hydrOXYzine 10 MG tablet Commonly known as: ATARAX Take 1 tablet (10 mg total) by mouth 3 (three) times daily as needed. What changed: reasons to take this   losartan 25 MG tablet Commonly known as: COZAAR Take 1 tablet (25 mg total) by mouth daily.   magnesium gluconate 500 MG tablet Commonly known as: MAGONATE Take 1 tablet (500 mg total) by mouth  at bedtime.   multivitamin with minerals Tabs tablet Take 1 tablet by mouth daily.   sertraline 50 MG tablet Commonly known as: ZOLOFT Take 1 tablet (50 mg total) by mouth at bedtime. What changed: how much to take   tiZANidine 4 MG tablet Commonly known as: Zanaflex Take 1 tablet (4 mg total) by mouth at bedtime.        Contact information for after-discharge care     Destination     [facility] .   Service: Skilled Nursing Contact information: [address] [place] [address] [phone]                     Consultations: ID Palliative GI  Procedures/Studies:  CT HEAD WO CONTRAST ( )  Result Date: 07/03/2022 CLINICAL DATA:  Altered mental status. EXAM: CT HEAD WITHOUT CONTRAST TECHNIQUE: Contiguous axial images were obtained from the base of the skull through the vertex without intravenous contrast. RADIATION DOSE REDUCTION: This exam was performed according to the departmental dose-optimization program which includes automated exposure control, adjustment of the mA and/or kV according to patient size and/or use of iterative reconstruction technique. COMPARISON:  Head CT dated 06/28/2022. FINDINGS: Evaluation of this exam is limited due to motion artifact. Brain: Similar appearance the ventricles and sulci and mild white matter hypodensity. No large acute intracranial hemorrhage noted. Evaluation however is very limited, almost nondiagnostic due to severe motion artifact. No midline shift. Vascular: No acute findings. Skull: No acute fracture. Sinuses/Orbits: No air-fluid level. Other: None IMPRESSION: Very limited, almost nondiagnostic, exam due to severe motion artifact. No large acute intracranial hemorrhage. Electronically Signed   By: Elgie Collard M.D.   On: 07/03/2022 01:36   CT ABDOMEN PELVIS WO CONTRAST  Result Date: 07/02/2022 CLINICAL DATA:  Nausea and vomiting EXAM: CT ABDOMEN AND PELVIS WITHOUT  CONTRAST TECHNIQUE: Multidetector CT imaging of the abdomen and pelvis was performed following the standard protocol without IV contrast. Unenhanced CT was performed per clinician order. Lack of IV contrast limits sensitivity and specificity, especially for evaluation of abdominal/pelvic solid viscera. RADIATION DOSE REDUCTION: This exam was performed according to the departmental dose-optimization program which includes automated exposure control, adjustment of the mA and/or kV according to patient size and/or use of iterative reconstruction technique. COMPARISON:  03/09/2022 FINDINGS: Lower chest: Small bilateral pleural effusions, slightly increased since prior study. Minimal dependent lower lobe atelectasis. Trace pericardial fluid unchanged. Hepatobiliary: Stable cholelithiasis. No evidence of acute cholecystitis. Unremarkable unenhanced appearance of the liver. Pancreas: Unremarkable unenhanced appearance. Spleen: Unremarkable unenhanced appearance. Adrenals/Urinary Tract: No urinary tract calculi or obstructive uropathy. The adrenals are unremarkable. Bladder is decompressed, limiting its evaluation. Stomach/Bowel: No bowel obstruction or ileus. Normal appendix right lower quadrant. Scattered diverticulosis of the distal colon without evidence of acute diverticulitis. No bowel wall thickening or inflammatory change. Interval removal of the percutaneous gastrostomy tube seen previously. Vascular/Lymphatic: Aortic atherosclerosis. No enlarged abdominal or pelvic lymph nodes. Reproductive: Uterus and bilateral adnexa are unremarkable. Other: No free fluid or free intraperitoneal gas. No abdominal wall hernia. Musculoskeletal: No acute or destructive bony lesions. Reconstructed images demonstrate no additional findings. IMPRESSION: 1. Cholelithiasis without acute cholecystitis. 2. Small bilateral pleural effusions, slightly increased since prior study. 3. Distal colonic diverticulosis without diverticulitis. 4.   Aortic Atherosclerosis (ICD10-I70.0). Electronically Signed   By: Sharlet Salina M.D.   On: 07/02/2022 15:46   DG Shoulder 1V Left  Result Date: 06/30/2022 CLINICAL DATA:  Left shoulder pain EXAM: LEFT SHOULDER COMPARISON:  06/28/2022 FINDINGS: Limited single view exam. No gross malalignment, acute osseous finding, fracture, or arthropathy. AC joint also aligned. Included chest unremarkable. IMPRESSION: No acute abnormality Electronically Signed   By: Judie Petit.  Shick M.D.   On: 06/30/2022 08:04   DG Chest Port 1 View  Result Date: 06/28/2022 CLINICAL DATA:  Questionable sepsis. EXAM: PORTABLE CHEST 1 VIEW COMPARISON:  Chest x-ray 06/23/2022.  CT chest 02/21/2022 FINDINGS: Heart size is within normal limits. There is prominence of the right paratracheal region corresponding to ectatic esophagus on recent CT. This is unchanged. Both lungs are clear. The visualized skeletal structures are unremarkable. IMPRESSION: No acute cardiopulmonary process. Electronically Signed   By: Darliss Cheney M.D.   On: 06/28/2022 15:17   CT Head Wo Contrast  Result Date: 06/28/2022 CLINICAL DATA:  Altered mental status. EXAM: CT HEAD WITHOUT CONTRAST TECHNIQUE: Contiguous axial images were obtained from the base of the skull through the vertex without intravenous contrast. RADIATION DOSE REDUCTION: This exam was performed according to the departmental dose-optimization program which includes automated exposure control, adjustment of the mA and/or kV according to patient size and/or use of iterative reconstruction technique. COMPARISON:  Head CT  dated 06/23/2022. FINDINGS: Brain: The ventricles and sulci are appropriate size for patient's age. Periventricular and deep white matter chronic microvascular ischemic changes, advanced for the patient's age and similar to prior CT. There is no acute intracranial hemorrhage. No mass effect or midline shift. No extra-axial fluid collection. Vascular: No hyperdense vessel or unexpected  calcification. Skull: Normal. Negative for fracture or focal lesion. Sinuses/Orbits: Chronic appearing opacification of several ethmoid air cells with remodeling. No air-fluid level. The mastoid air cells are clear. Other: None IMPRESSION: 1. No acute intracranial pathology. 2. Chronic microvascular ischemic changes similar to prior CT. Electronically Signed   By: Elgie Collard M.D.   On: 06/28/2022 14:58   CT Head Wo Contrast  Result Date: 06/23/2022 CLINICAL DATA:  Weakness and altered mental EXAM: CT HEAD WITHOUT CONTRAST TECHNIQUE: Contiguous axial images were obtained from the base of the skull through the vertex without intravenous contrast. RADIATION DOSE REDUCTION: This exam was performed according to the departmental dose-optimization program which includes automated exposure control, adjustment of the mA and/or kV according to patient size and/or use of iterative reconstruction technique. COMPARISON:  CT 02/26/2022 FINDINGS: Brain: No acute territorial infarction, hemorrhage, or intracranial mass. The previously noted right intraparenchymal hematoma has resolved. Mild wallerian degeneration of the right peduncle and brainstem. Patchy hypodensity in the bilateral white matter likely chronic small vessel disease. Nonenlarged ventricles. Vascular: No hyperdense vessels.  No unexpected calcification Skull: Normal. Negative for fracture or focal lesion. Sinuses/Orbits: Ethmoid sinus disease. Other: None IMPRESSION: 1. No CT evidence for acute intracranial abnormality. 2. Patchy hypodensity in the white matter, probable chronic small vessel disease. Electronically Signed   By: Jasmine Pang M.D.   On: 06/23/2022 20:32   DG Chest Port 1 View  Result Date: 06/23/2022 CLINICAL DATA:  Generalized weakness with altered mental status. EXAM: PORTABLE CHEST 1 VIEW COMPARISON:  03/22/2022 FINDINGS: The heart size and mediastinal contours are within normal limits. Both lungs are clear. The visualized skeletal  structures are unremarkable. IMPRESSION: No active disease. Electronically Signed   By: Darliss Cheney M.D.   On: 06/23/2022 17:01     Subjective: - no chest pain, shortness of breath, no abdominal pain, nausea or vomiting.   Discharge Exam: BP (!) 151/93   Pulse 79   Temp 98.3 F (36.8 C) (Oral)   Resp 16   Ht 5\' 3"  (1.6 m)   Wt 56.3 kg   SpO2 96%   BMI 21.99 kg/m   General: Pt is alert, awake, not in acute distress Cardiovascular: RRR, S1/S2 +, no rubs, no gallops Respiratory: CTA bilaterally, no wheezing, no rhonchi Abdominal: Soft, NT, ND, bowel sounds + Extremities: no edema, no cyanosis   The results of significant diagnostics from this hospitalization (including imaging, microbiology, ancillary and laboratory) are listed below for reference.     Microbiology: Recent Results (from the past 240 hour(s))  Blood Culture (routine x 2)     Status: None   Collection Time: 06/28/22  4:15 PM   Specimen: BLOOD  Result Value Ref Range Status   Specimen Description   Final    BLOOD SITE NOT SPECIFIED Performed at [facility], [address] [place], [place]    Special Requests   Final    BOTTLES DRAWN AEROBIC ONLY Blood Culture adequate volume Performed at [facility], [address] [place], [place]    Culture   Final    NO GROWTH 5 DAYS Performed at [facility], [address], [place], [place] [address]    Report Status 07/03/2022 FINAL  Final  Blood Culture (routine x 2)     Status: None   Collection Time: 06/28/22  6:27 PM   Specimen: BLOOD  Result Value Ref Range Status   Specimen Description   Final    BLOOD RIGHT ANTECUBITAL Performed at [facility], [address], [place], [place] [address]    Special Requests   Final    BOTTLES DRAWN AEROBIC ONLY Blood Culture adequate volume Performed at [facility], [address], [place], [place] [address]    Culture    Final    NO GROWTH 5 DAYS Performed at [facility], [address] [place], [place] [address]    Report Status 07/03/2022 FINAL  Final  Urine Culture     Status: None   Collection Time: 06/28/22  6:27 PM   Specimen: In/Out Cath Urine  Result Value Ref Range Status   Specimen Description   Final    IN/OUT CATH URINE Performed at [facility], [address] [place], [place] [address]    Special Requests   Final    NONE Performed at [facility], [address], [place], [place] [address]    Culture   Final    NO GROWTH Performed at [facility], [address], [place], [place] [address]    Report Status 06/29/2022 FINAL  Final     Labs: Basic Metabolic Panel: Recent Labs  Lab 06/28/22 1615 06/29/22 0528 06/30/22 1022 07/02/22 1929  NA 145 145 147* 131*  K 3.6 3.0* 3.3* 3.9  CL 118* 115* 120* 104  CO2 14* 19* 18* 20*  GLUCOSE 104* 125* 115* 162*  BUN 9 9 7 7   CREATININE 0.82 0.68 0.79 0.64  CALCIUM 11.8* 10.9* 11.1* 10.4*   Liver Function Tests: Recent Labs  Lab 06/28/22 1615  AST 24  ALT 25  ALKPHOS 72  BILITOT 0.9  PROT 7.7  ALBUMIN 4.1   CBC: Recent Labs  Lab 06/28/22 1615 06/29/22 0528 07/02/22 1929  WBC 7.3 6.8 11.6*  NEUTROABS 4.7  --  9.4*  HGB 13.1 12.5 13.1  HCT 39.2 35.8* 36.7  MCV 83.9 81.9 78.6*  PLT 196 178 205   CBG: Recent Labs  Lab 07/01/22 1237 07/03/22 0049  GLUCAP 128* 137*   Hgb A1c No results for input(s): "HGBA1C" in the last 72 hours. Lipid Profile No results for input(s): "CHOL", "HDL", "LDLCALC", "TRIG", "CHOLHDL", "LDLDIRECT" in the last 72 hours. Thyroid function studies No results for input(s): "TSH", "T4TOTAL", "T3FREE", "THYROIDAB" in the last 72 hours.  Invalid input(s): "FREET3" Urinalysis    Component Value Date/Time   COLORURINE STRAW (A) 06/28/2022 1827   APPEARANCEUR CLEAR 06/28/2022 1827   LABSPEC 1.010 06/28/2022 1827   PHURINE 5.0 06/28/2022  1827   GLUCOSEU NEGATIVE 06/28/2022 1827   HGBUR NEGATIVE 06/28/2022 1827   BILIRUBINUR NEGATIVE 06/28/2022 1827   KETONESUR 80 (A) 06/28/2022 1827   PROTEINUR NEGATIVE 06/28/2022 1827   UROBILINOGEN 1.0 09/20/2015 1448   NITRITE NEGATIVE 06/28/2022 1827   LEUKOCYTESUR NEGATIVE 06/28/2022 1827    FURTHER DISCHARGE INSTRUCTIONS:   Get Medicines reviewed and adjusted: Please take all your medications with you for your next visit with your Primary MD   Laboratory/radiological data: Please request your Primary MD to go over all hospital tests and procedure/radiological results at the follow up, please ask your  Primary MD to get all Hospital records sent to his/her office.   In some cases, they will be blood work, cultures and biopsy results pending at the time of your discharge. Please request that your primary care M.D. goes through all the records of your hospital data and follows up on these results.   Also Note the following: If you experience worsening of your admission symptoms, develop shortness of breath, life threatening emergency, suicidal or homicidal thoughts you must seek medical attention immediately by calling 911 or calling your MD immediately  if symptoms less severe.   You must read complete instructions/literature along with all the possible adverse reactions/side effects for all the Medicines you take and that have been prescribed to you. Take any new Medicines after you have completely understood and accpet all the possible adverse reactions/side effects.    Do not drive when taking Pain medications or sleeping medications (Benzodaizepines)   Do not take more than prescribed Pain, Sleep and Anxiety Medications. It is not advisable to combine anxiety,sleep and pain medications without talking with your primary care practitioner   Special Instructions: If you have smoked or chewed Tobacco  in the last 2 yrs please stop smoking, stop any regular Alcohol  and or any  Recreational drug use.   Wear Seat belts while driving.   Please note: You were cared for by a hospitalist during your hospital stay. Once you are discharged, your primary care physician will handle any further medical issues. Please note that NO REFILLS for any discharge medications will be authorized once you are discharged, as it is imperative that you return to your primary care physician (or establish a relationship with a primary care physician if you do not have one) for your post hospital discharge needs so that they can reassess your need for medications and monitor your lab values.  Time coordinating discharge: 40 minutes  SIGNED:  Pamella Pert, MD, PhD 07/05/2022, 11:29 AM

## 2022-07-05 NOTE — TOC Transition Note (Addendum)
Transition of Care [facility]) - CM/SW Discharge Note   Patient Details  Name: Sarah Myers MRN: [id number] Date of Birth: 1971-02-24  [facility] CM/SW Contact:  Otelia Santee, LCSW Phone Number: 07/05/2022, 11:50 AM   Clinical Narrative:    Pt is to transfer to [facility] room 123A. Nurses to call report to [phone]. Pt's daughter has been notified of transfer. PTAR to be arranged for transportation.    Final next level of care: Skilled Nursing Facility Barriers to Discharge: Barriers Resolved   Patient Goals and CMS Choice Patient states their goals for this hospitalization and ongoing recovery are:: To return home   Choice offered to / list presented to : Patient, Adult Children  Discharge Placement   Existing PASRR number confirmed : 06/30/22          Patient chooses bed at: Other - please specify in the comment section below: [facility]) Patient to be transferred to facility by: PTAR Name of family member notified: Naijah Lacek Patient and family notified of of transfer: 07/05/22  Discharge Plan and Services In-house Referral: Clinical Social Work Discharge Planning Services: CM Consult Post Acute Care Choice: Skilled Nursing Facility          DME Arranged: N/A DME Agency: NA                  Social Determinants of Health (SDOH) Interventions     Readmission Risk Interventions    07/05/2022   11:29 AM 06/30/2022   12:52 PM 03/09/2022    9:54 AM  Readmission Risk Prevention Plan  Transportation Screening Complete Complete Complete  PCP or Specialist Appt within 5-7 Days  Complete   PCP or Specialist Appt within 3-5 Days Complete  Complete  Home Care Screening  Complete   Medication Review (RN CM)  Complete   HRI or Home Care Consult Complete  Not Complete  Social Work Consult for Recovery Care Planning/Counseling Complete  Complete  Palliative Care Screening Complete    Medication Review Oceanographer)  Complete  Complete

## 2022-07-05 NOTE — Progress Notes (Signed)
Speech Language Pathology Treatment: Dysphagia  Patient Details Name: Sarah Myers MRN: [id number] DOB: January 09, 1971 Today's Date: 07/05/2022 Time: 3419-6222 SLP Time Calculation (min) (ACUTE ONLY): 14 min  Assessment / Plan / Recommendation Clinical Impression  Pt seen for skilled SLP to assess po tolerance, determine readiness for dietary advancement.  No family present at this time.  Pt greeted with meal on her tray - no po intake noted.  RN reports pt was pocketing food in the right side of her oral cavity at breakfast. SLP faciliated session by scooping food on spoon and having pt bring to oral cavity using her right hand. Minimally impaired mastication noted with mild oral retention of chicken/brocolli on LEFT (due to facial nerve deficit) that cleared with mashed potatoe intake.  Magic shake (approx 30%) consumed via straw  - pt benefited from moderate verbal cue to encourage adequate suction on straw as she stated "It's not open".  No indication of aspiration with all po intake- pt did demonstrate minimal wheeze - which she states is normal.   Recommend dys3/nectar diet and follow up with SLP at SNF to maximize intake safety/intake.  Using teach back, advised pt to strengthen cough, voice and "hock" to improve airway protection if she aspirates.  Recommend pt be given thin water between meals after oral care for hydration and QOL.    HPI   HPI: 51yo female admitted 06/28/22 with increased anxiety, decreased PO intake, worsening deficits since discharge from CIR in May (DC'd on D3/thin). 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      SLP Plan  Continue with current plan of care      Recommendations for follow up therapy are one component of a multi-disciplinary discharge planning process, led by the attending physician.  Recommendations  may be updated based on patient status, additional functional criteria and insurance authorization.    Recommendations  Diet recommendations: Dysphagia 2 (fine chop);Dysphagia 3 (mechanical soft);Nectar-thick liquid Liquids provided via: Cup;Straw Medication Administration: Crushed with puree Supervision: Staff to assist with self feeding;Full supervision/cueing for compensatory strategies Compensations: Minimize environmental distractions;Slow rate;Small sips/bites Postural Changes and/or Swallow Maneuvers: Seated upright 90 degrees;Upright 30-60 min after meal                Oral Care Recommendations: Oral care BID Follow Up Recommendations: Follow physician's recommendations for discharge plan and follow up therapies Assistance recommended at discharge: Frequent or constant Supervision/Assistance SLP Visit Diagnosis: Dysphagia, unspecified (R13.10) Plan: Continue with current plan of care    Rolena Infante, MS [facility] SLP Acute Rehab Services Office [phone] Pager [phone]        Chales Abrahams  07/05/2022, 12:43 PM

## 2022-07-05 NOTE — Progress Notes (Signed)
PHARMACIST - PHYSICIAN COMMUNICATION  DR:   Elvera Lennox  CONCERNING: IV to Oral Route Change Policy  RECOMMENDATION: This patient is receiving pantoprazole by the intravenous route.  Based on criteria approved by the Pharmacy and Therapeutics Committee, the intravenous medication(s) is/are being converted to the equivalent oral dose form(s).   DESCRIPTION: These criteria include: The patient is eating (either orally or via tube) and/or has been taking other orally administered medications for a least 24 hours The patient has no evidence of active gastrointestinal bleeding or impaired GI absorption (gastrectomy, short bowel, patient on TNA or NPO).  If you have questions about this conversion, please contact the Pharmacy Department  []   [phone] )  ( [phone] []   [phone] )  [facility] []   [phone] )  [facility] []   [phone] )  [facility] [x]   [phone] )  [facility]    ( [phone], PharmD, BCPS 07/05/2022 10:41 AM

## 2022-07-05 NOTE — Progress Notes (Signed)
Report called to Carmelina Dane, RN.

## 2022-07-05 NOTE — Plan of Care (Signed)
No acute events this shift.  Problem: Fluid Volume: Goal: Hemodynamic stability will improve Outcome: Progressing   Problem: Clinical Measurements: Goal: Diagnostic test results will improve Outcome: Progressing Goal: Signs and symptoms of infection will decrease Outcome: Progressing   Problem: Education: Goal: Knowledge of General Education information will improve Description: Including pain rating scale, medication(s)/side effects and non-pharmacologic comfort measures Outcome: Progressing   Problem: Health Behavior/Discharge Planning: Goal: Ability to manage health-related needs will improve Outcome: Progressing   Problem: Clinical Measurements: Goal: Ability to maintain clinical measurements within normal limits will improve Outcome: Progressing Goal: Will remain free from infection Outcome: Progressing Goal: Diagnostic test results will improve Outcome: Progressing Goal: Respiratory complications will improve Outcome: Progressing Goal: Cardiovascular complication will be avoided Outcome: Progressing   Problem: Activity: Goal: Risk for activity intolerance will decrease Outcome: Progressing   Problem: Nutrition: Goal: Adequate nutrition will be maintained Outcome: Progressing   Problem: Coping: Goal: Level of anxiety will decrease Outcome: Progressing   Problem: Elimination: Goal: Will not experience complications related to bowel motility Outcome: Progressing Goal: Will not experience complications related to urinary retention Outcome: Progressing   Problem: Pain Managment: Goal: General experience of comfort will improve Outcome: Progressing   Problem: Safety: Goal: Ability to remain free from injury will improve Outcome: Progressing   Problem: Skin Integrity: Goal: Risk for impaired skin integrity will decrease Outcome: Progressing

## 2022-07-05 NOTE — Progress Notes (Signed)
Physical Therapy Treatment Patient Details Name: Sarah Myers MRN: [id number] DOB: [birth year] Today's Date: 07/05/2022   History of Present Illness 51 y.o. 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 Pt with recent admission and discharged on 06/26/2022 for which she was treated empirically with IV Rocephin for SIRS but no source of infection found.  However, since discharge the patient has continued increased anxiety and restlessness not eating and drinking well 2 out of 2 blood cultures from 06/23/2022 is growing gram-positive rods    PT Comments    Patient eager to participate in therapy once awoken from nap. Pt answered simple questions and attempted to initiate bringing LE's to EOB and reaching Rt UE to bed rail. Max Assist required to pivot and raise trunk to EOB. She was able to complete Rt lateral UE sliding/reaching and Rt forearm prop/press up while sitting EOB. EOS pt transferred to chair via lift and reposition with pillows for support and comfort. Continue to recommend SNF rehab prior to return home. Will progress as able.    Recommendations for follow up therapy are one component of a multi-disciplinary discharge planning process, led by the attending physician.  Recommendations may be updated based on patient status, additional functional criteria and insurance authorization.  Follow Up Recommendations  Skilled nursing-short term rehab (<3 hours/day) Can patient physically be transported by private vehicle: No   Assistance Recommended at Discharge Frequent or constant Supervision/Assistance  Patient can return home with the following Two  people to help with walking and/or transfers;Two people to help with bathing/dressing/bathroom;Assistance with feeding;Assistance with cooking/housework;Direct supervision/assist for medications management;Direct supervision/assist for financial management;Assist for transportation;Help with stairs or ramp for entrance   Equipment Recommendations  Other (comment) (mechanical lift)    Recommendations for Other Services       Precautions / Restrictions Precautions Precautions: Fall Precaution Comments: L UE spastic hemiplegia, L LE hemiparesis Restrictions Weight Bearing Restrictions: No     Mobility  Bed Mobility Overal bed mobility: Needs Assistance Bed Mobility: Rolling, Supine to Sit, Sit to Supine                Transfers Overall transfer level: Needs assistance   Transfers: Bed to chair/wheelchair/BSC               Transfer via Lift Equipment: [place]  Ambulation/Gait                   Stairs             Wheelchair Mobility    Modified Rankin (Stroke Patients Only)       Balance Overall balance assessment: Needs assistance Sitting-balance support: Feet unsupported, Single extremity supported Sitting balance-Leahy Scale: Fair       Standing balance-Leahy Scale: Zero                              Cognition Arousal/Alertness: Awake/alert, Lethargic Behavior During Therapy: Flat affect Overall Cognitive Status: Impaired/Different from baseline  Exercises      General Comments        Pertinent Vitals/Pain Pain Assessment Pain Assessment: Faces Faces Pain Scale: No hurt Breathing: normal Negative Vocalization: none Facial Expression: smiling or inexpressive Body Language: relaxed Consolability: no need to console PAINAD Score: 0 Pain Intervention(s): Monitored during session, Repositioned    Home Living                          Prior  Function            PT Goals (current goals can now be found in the care plan section) Acute Rehab PT Goals PT Goal Formulation: With family Time For Goal Achievement: 07/14/22 Potential to Achieve Goals: Fair Progress towards PT goals: Progressing toward goals    Frequency    Min 2X/week      PT Plan Current plan remains appropriate    Co-evaluation              AM-PAC PT "6 Clicks" Mobility   Outcome Measure  Help needed turning from your back to your side while in a flat bed without using bedrails?: Total Help needed moving from lying on your back to sitting on the side of a flat bed without using bedrails?: Total Help needed moving to and from a bed to a chair (including a wheelchair)?: Total Help needed standing up from a chair using your arms (e.g., wheelchair or bedside chair)?: Total Help needed to walk in hospital room?: Total Help needed climbing 3-5 steps with a railing? : Total 6 Click Score: 6    End of Session Equipment Utilized During Treatment: Other (comment) (lift sling) Activity Tolerance: Patient tolerated treatment well Patient left: in chair;with call bell/phone within reach;with chair alarm set;Other (comment) (lift sling under pt with padding for comfort) Nurse Communication: Mobility status;Need for lift equipment PT Visit Diagnosis: Other abnormalities of gait and mobility (R26.89);Hemiplegia and hemiparesis Hemiplegia - Right/Left: Left Hemiplegia - caused by: Nontraumatic intracerebral hemorrhage     Time: 6195-0932 PT Time Calculation (min) (ACUTE ONLY): 25 min  Charges:  $Therapeutic Activity: 23-37 mins                     Wynn Maudlin, DPT Acute Rehabilitation Services Office [phone] Pager [phone]  07/05/22 1:14 PM

## 2022-07-14 ENCOUNTER — Inpatient Hospital Stay: Payer: 59 | Admitting: Family Medicine

## 2022-07-31 NOTE — Progress Notes (Deleted)
[facility] [address] [place]. [place] [address] [phone]       HOSPITAL FOLLOW UP NOTE  [name] Date of Birth:  1970/12/22 Medical Record Number:  [id number]   Reason for Referral:  hospital stroke follow up    SUBJECTIVE:   CHIEF COMPLAINT:  No chief complaint on file.   HPI:   [name] is a 51 y.o. female with history of HTN who presented on 02/05/2022 with AMS, L sided weakness, and slurred speech.  Personally reviewed hospitalization pertinent progress notes, lab work and imaging.  Evaluated by Dr. Roda Shutters for acute right large BG ICH likely secondary to hypertensive emergency.  Initial imaging showed 4 to 5 mm leftward midline shift with serial imaging relatively stable and on 3/8 near resolution of right large BG hemorrhage.  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  EF 65 to 70%.  Required Cleviprex for BP control, recommended amlodipine, Coreg and Cozaar.  Noted bilateral PE with elevated D-dimer, initially treated with IV heparin and transition to Eliquis.  LE Doppler negative for DVT.  Initially intubated in ED for airway protection, extubated on 2/23 with post extubation stridor leading to reintubation, reattempted extubation 2/26 but failed and eventually tracheostomy placed 2/28.  PEG tube placed 3/20 for poststroke dysphagia on dysphagia 2 diet.  Treated for MSSA pneumonia with completion of antibiotic therapy.  Per therapy recommendations, discharged to specialty hospital 4/4 and transitioned to CIR on 4/18 for ongoing therapy needs.   Today, 08/02/2022, patient is being seen for initial hospital follow-up.  She was discharged home with her daughter from CIR on 5/10.  During CIR admission, PEG tube removed on 5/5.  Continued deficits of left spastic hemiplegia, ***.     Issues with anxiety, insomnia and cognitive impairment currently on baclofen (insomnia/spasticity), hydroxyzine, BuSpar, and sertraline.  She is also on amantadine and gabapentin.  She is closely followed by PMR Dr. Carlis Abbott for management.   Daughter is caring for her 24/7 at home ***  Remains on Eliquis for b/l PE -denies side effects Blood pressure *** PCP f/u ***   Rehospitalized 7/7 -7/10 for altered mental status with suspected UTI treated with antibiotics.  Returned on 7/12 for increased anxiety/restlessness and poor appetite. Noted prior blood cultures positive for gram-positive rods 2/2 (negative up until this admission).  ID consulted with further antibiotic recommendations.  Discharged back home 7/19.        PERTINENT IMAGING  Per hospitalization 02/05/2022 - *** Code Stroke CT head Positive for Acute Right Lentiform Hemorrhage tracking into the right temporal lobe. Intra-axial blood volume slightly greater than 33 mL. Mass effect with 4-5 mm leftward midline shift; superimposed chronic meningocele progressing since 2016.  Repeat CTH at 0045 with 36 mL hemorrhagic area, and 0835 repeat unchanged. CTA head & neck No large vessel occlusion, intracranial aneurysm, or abnormal vasculature associated with hemorrhage. 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 CT repeat 2/20 stable hematoma and MLS Follow-up MRI 2/21 with some extension of edema and stable ICH. Follow up CT 2/24 and 2/26 with no progression of ICH; 33mm shift on the latter CT repeat 3/8 showed near resolution of right large BG hemorrhage 2D Echo LVEF 65-70%, small pericardial effusion present; trival MVR LDL 64 HgbA1c 5.6   ROS:   14 system review of systems performed and negative with exception of ***  PMH:  Past Medical History:  Diagnosis Date   Hypertension     PSH:  Past Surgical History:  Procedure Laterality Date   BREAST LUMPECTOMY  1992   CESAREAN  SECTION     IR GASTROSTOMY TUBE MOD SED  03/06/2022   ORIF RADIAL FRACTURE  02/10/2012   Procedure: OPEN REDUCTION INTERNAL FIXATION (ORIF) RADIAL FRACTURE;  Surgeon: Cammy Copa, MD;  Location[facility] OR;  Service: Orthopedics;  Laterality: Right;   ORIF ULNAR FRACTURE  02/10/2012   Procedure: OPEN REDUCTION INTERNAL FIXATION (ORIF) ULNAR FRACTURE;  Surgeon: Cammy Copa, MD;  Location: [facility] OR;  Service: Orthopedics;  Laterality: Right;   TUBAL LIGATION      Social History:  Social History   Socioeconomic History   Marital status: Married    Spouse name: Not on file   Number of children: Not on file   Years of education: Not on file   Highest education level: Not on file  Occupational History   Not on file  Tobacco Use   Smoking status: Never   Smokeless tobacco: Never  Vaping Use   Vaping Use: Never used  Substance and Sexual Activity   Alcohol use: Not Currently    Alcohol/week: 0.0 standard drinks of alcohol   Drug use: No   Sexual activity: Never  Other Topics Concern   Not on file  Social History Narrative   Not on file   Social Determinants of Health   Financial Resource Strain: Not on file  Food Insecurity: Not on file  Transportation Needs: Not on file  Physical Activity: Not on file  Stress: Not on file  Social Connections: Not on file  Intimate Partner Violence: Not on file    Family History:  Family History  Problem Relation Age of Onset   Diabetes Mother    Hypertension Mother    Heart disease Mother    Diabetes Maternal Aunt    Stroke Maternal Uncle    Diabetes Maternal Grandmother    Heart disease Maternal Grandmother    Diabetes Maternal Grandfather    Heart disease Maternal Grandfather    Heart disease Paternal Grandmother    Heart disease Paternal Grandfather     Medications:   Current Outpatient Medications on File Prior to Visit  Medication Sig Dispense Refill   acetaminophen (TYLENOL) 500 MG tablet Take 500-1,000 mg by mouth  every 6 (six) hours as needed for moderate pain.     amantadine (SYMMETREL) 100 MG capsule Take 1 capsule (100 mg total) by mouth 2 (two) times daily. 180 capsule 3   amLODipine (NORVASC) 10 MG tablet Take 1 tablet (10 mg total) by mouth daily. 90 tablet 3   amoxicillin (AMOXIL) 250 MG/5ML suspension Take 10 mLs (500 mg total) by mouth every 8 (eight) hours. 150 mL 0   apixaban (ELIQUIS) 5 MG TABS tablet Take 1 tablet (5 mg total) by mouth 2 (two) times daily. 60 tablet 1   baclofen (LIORESAL) 10 MG tablet Take 5-15 mg by mouth 2 (two) times daily. Take 1/2 tablet (5 mg) and Take 1.5 tablets (15 mg) at bedtime     busPIRone (BUSPAR) 5 MG tablet Take 1 tablet (5 mg total) by mouth 3 (three) times daily as needed. 90 tablet 3   carvedilol (COREG) 6.25 MG tablet Take 1 tablet (6.25 mg total) by mouth 2 (two) times daily with a meal. 180 tablet 3   famotidine (PEPCID) 20 MG tablet Take 1 tablet (20 mg total) by  mouth 2 (two) times daily. (Patient taking differently: Take 20 mg by mouth 2 (two) times daily as needed for heartburn or indigestion.) 60 tablet 0   gabapentin (NEURONTIN) 100 MG capsule Take 1 capsule (100 mg total) by mouth 2 (two) times daily. (Patient taking differently: Take 100 mg by mouth 2 (two) times daily as needed (nerve pain).) 60 capsule 0   hydrALAZINE (APRESOLINE) 50 MG tablet Take 1 tablet (50 mg total) by mouth every 6 (six) hours. 360 tablet 3   hydrOXYzine (ATARAX) 10 MG tablet Take 1 tablet (10 mg total) by mouth 3 (three) times daily as needed. (Patient taking differently: Take 10 mg by mouth 3 (three) times daily as needed for anxiety.) 30 tablet 0   losartan (COZAAR) 25 MG tablet Take 1 tablet (25 mg total) by mouth daily. 90 tablet 3   magnesium gluconate (MAGONATE) 500 MG tablet Take 1 tablet (500 mg total) by mouth at bedtime. 30 tablet 0   Multiple Vitamin (MULTIVITAMIN WITH MINERALS) TABS tablet Take 1 tablet by mouth daily. 30 tablet 0   sertraline (ZOLOFT) 50 MG  tablet Take 1 tablet (50 mg total) by mouth at bedtime. (Patient taking differently: Take 25 mg by mouth at bedtime.) 90 tablet 3   tiZANidine (ZANAFLEX) 4 MG tablet Take 1 tablet (4 mg total) by mouth at bedtime. (Patient not taking: Reported on 06/23/2022) 30 tablet 11   No current facility-administered medications on file prior to visit.    Allergies:   Allergies  Allergen Reactions   Lisinopril Cough      OBJECTIVE:  Physical Exam  There were no vitals filed for this visit. There is no height or weight on file to calculate BMI. No results found.     09/19/2021    8:21 AM  Depression screen PHQ 2/9  Decreased Interest 0  Down, Depressed, Hopeless 3  PHQ - 2 Score 3  Altered sleeping 0  Tired, decreased energy 0  Change in appetite 0  Feeling bad or failure about yourself  2  Trouble concentrating 0  Moving slowly or fidgety/restless 0  Suicidal thoughts 0  PHQ-9 Score 5     General: well developed, well nourished, seated, in no evident distress Head: head normocephalic and atraumatic.   Neck: supple with no carotid or supraclavicular bruits Cardiovascular: regular rate and rhythm, no murmurs Musculoskeletal: no deformity Skin:  no rash/petichiae Vascular:  Normal pulses all extremities   Neurologic Exam Mental Status: Awake and fully alert. Oriented to place and time. Recent and remote memory intact. Attention span, concentration and fund of knowledge appropriate. Mood and affect appropriate.  Cranial Nerves: Fundoscopic exam reveals sharp disc margins. Pupils equal, briskly reactive to light. Extraocular movements full without nystagmus. Visual fields full to confrontation. Hearing intact. Facial sensation intact. Face, tongue, palate moves normally and symmetrically.  Motor: Normal bulk and tone. Normal strength in all tested extremity muscles Sensory.: intact to touch , pinprick , position and vibratory sensation.  Coordination: Rapid alternating movements  normal in all extremities. Finger-to-nose and heel-to-shin performed accurately bilaterally. Gait and Station: Arises from chair without difficulty. Stance is normal. Gait demonstrates normal stride length and balance with ***. Tandem walk and heel toe ***.  Reflexes: 1+ and symmetric. Toes downgoing.     NIHSS  *** Modified Rankin  ***      ASSESSMENT: Sarah Myers is a 51 y.o. year old female right large BG ICH on 02/05/2022 likely secondary to hypertensive emergency.  Prolonged  hospital course with bilateral PEs, respiratory failure requiring tracheostomy and leukocytosis.  Vascular risk factors include HTN and fm hx of stroke.      PLAN:  Right large BG ICH:  Residual deficit: ***.  Continue {anticoagulants:31417}  and ***  for secondary stroke prevention.   Discussed secondary stroke prevention measures and importance of close PCP follow up for aggressive stroke risk factor management including BP goal<130/90, HLD with LDL goal<70 and DM with A1c.<7 .  Stroke labs ***: LDL ***, A1c *** I have gone over the pathophysiology of stroke, warning signs and symptoms, risk factors and their management in some detail with instructions to go to the closest emergency room for symptoms of concern. Bilateral PE    Follow up in *** or call earlier if needed   CC:  [facility] provider: Dr. Pearlean Brownie PCP: Shayne Alken, MD    I spent *** minutes of face-to-face and non-face-to-face time with patient.  This included previsit chart review including review of recent hospitalization, lab review, study review, order entry, electronic health record documentation, patient education regarding recent stroke including etiology, secondary stroke prevention measures and importance of managing stroke risk factors, residual deficits and typical recovery time and answered all other questions to patient satisfaction   Sarah Myers, AGNP-BC  [facility] [address]  [address] [place], [place] [address]  Phone [phone] Fax [phone] Note: This document was prepared with digital dictation and possible smart phrase technology. Any transcriptional errors that result from this process are unintentional.

## 2022-08-02 ENCOUNTER — Inpatient Hospital Stay: Payer: Commercial Managed Care - HMO | Admitting: Adult Health

## 2022-09-08 ENCOUNTER — Encounter
Payer: Commercial Managed Care - HMO | Attending: Physical Medicine and Rehabilitation | Admitting: Physical Medicine and Rehabilitation

## 2022-09-08 ENCOUNTER — Encounter: Payer: Self-pay | Admitting: Physical Medicine and Rehabilitation

## 2022-09-08 VITALS — BP 121/84 | HR 82 | Ht 63.0 in

## 2022-09-08 DIAGNOSIS — Z789 Other specified health status: Secondary | ICD-10-CM | POA: Insufficient documentation

## 2022-09-08 DIAGNOSIS — Z7409 Other reduced mobility: Secondary | ICD-10-CM | POA: Diagnosis present

## 2022-09-08 DIAGNOSIS — R400 Somnolence: Secondary | ICD-10-CM | POA: Diagnosis present

## 2022-09-08 DIAGNOSIS — G4700 Insomnia, unspecified: Secondary | ICD-10-CM | POA: Diagnosis present

## 2022-09-08 DIAGNOSIS — F331 Major depressive disorder, recurrent, moderate: Secondary | ICD-10-CM | POA: Diagnosis present

## 2022-09-08 DIAGNOSIS — R630 Anorexia: Secondary | ICD-10-CM | POA: Diagnosis present

## 2022-09-08 MED ORDER — MIRTAZAPINE 7.5 MG PO TABS
7.5000 mg | ORAL_TABLET | Freq: Every day | ORAL | 3 refills | Status: DC
Start: 2022-09-08 — End: 2022-09-08

## 2022-09-08 NOTE — Progress Notes (Signed)
Subjective:    Patient ID: Sarah Myers, female    DOB: 1971/07/06, 51 y.o.   MRN: [id number]  HPI Sarah Myers presents for f/u of insomnia, impaired mobility and ADLs due to stroke, and spasticity  1) Insomnia -daughter is not sure how she is sleeping now  2) Spasticity -still tight in upper and lower extremities at times -daughter thinks patient developed hallucinations in response to tizanidine and these have resolved since she has stopped tizanidine -she has restarted Baclofen  3) Swelling of all limbs -improved -the DME company no longer provides compression garments  4) Frequent urination -improved -no dysuria  5) Impaired mobility -her daughter would like to be able to bring her to her doctor's appointments but is unable to due to lack of transportation. 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  -she is now in a facility at [facility] -she wants to come home.   6) Hallucinations: -daughter notes these have started after she started taking Tizanidine -has resolved since tizanidine has stopped  7) Anxiety -daughter notes it has been severe -she has picked up hydroxyzine and will try this, daughter feels this has helped -she is having a lot of anxiety this morning -her daughter gave her hydroxyzine at 5am and asks what she should do now -she is willing to try additional anxiety medication in between hydroxyzine if this is not effective enough -she is willing to speak with a pyschiatrist  8) Impaired memory and cognition -daughter notes impaired memory and cognition and asks if her mom could be getting early dementia, she asks what can be done for this, she asks if any testing is needed  9) Decreased appetite: --her daughter is concerned about her losing weight  10) Daytime  somnolence  11) Depression: her daughter would like for her to see a psychologist for therapy.   12) HTN -very well controlled today   Review of Systems +swelling of all limbs, +spasticity, +insomnia, +hallucinations    Objective:   Physical Exam Gen: no distress, normal appearing, BM 121/8 HEENT: oral mucosa pink and moist, NCAT Cardio: Reg rate Chest: normal effort, normal rate of breathing Abd: soft, non-distended Ext: no edema Psych: pleasant, normal affect Skin: intact Neuro: Falling asleep in office today but able to state that she is tired, MAS 3 in LUE and LLE       Assessment & Plan:   1) Insomnia -improved -d/c tizanidine and restart baclofen  2) Spasticity -d/c tizanidine and restart baclofen -discussed Botox and daughter will consider, will plan for 400U in LUE and LLE next visit  3) Swelling of limbs: -improved -prescribed extra large compression garments for bilateral lower extremities, small for bilateral upper extremities, discussed that she could purchase these online and send receipt to insurance for reimbursement  4) Impaired mobility and ADLs -continue PT, OT -reached out to SW and emailed patient [employer] forms to complete for transportation -helped daughter to fill out each item on the [employer] forms and asked her to mail forms to our office for [place] to send on her behalf since she does not have a fax machine -discussed every question on [employer] form and guiding daughter on how to answer questions accurately based on her mother's condition. There is a Part B on the form that  needs to be filled by [place], discussed with daughter that she can mail this to our office and I can completed this portion before we fax the form for her, discussed with patient and daughter her major limitations of left sided weakness and how this inhibits her from walking, her inability to navigate the stairs in her apartment which has thus far prevented her from leaving her  apartment, the fact that she does not have a ramp, her difficulty in getting to and from doctor's appointments due to this impaired mobility, the fact that she does not have a wheelchair ramp at home -[place] to call patient to answer some of her questions -provided note stating that daughter has been unable to work due to caring for her mother full time at home  4) Anxiety -prescribed hydroxyzine prn -prescribed buspar 5mg  TID PRN for use in between hydroxyzine if this is not effective enough -referred to psychiatry -discussed eating 2 [place] nuts per day -discussed decreasing Zoloft to 25mg  since daughter would prefer to use PRN medication for her -discussed benefits of meditation and exercise  5) Polypharmacy -reviewed all medications, their indications, and potential side effects with daughter.   6) GERD -recommended using pepcid PRN  7) HTN -continue Cozaar -drink 6-8 glasses of water per day to protect kidneys while using Cozaar -continue magnesium supplement -discussed excellent BM  8) History of CVA -continue multivitamin  9) Cognitive impairment:  -discussed that cognitive impairment can be secondary to stroke, her baclofen medication -discussed that neurology can help with cognitive testing to assess for dementia -discussed patient's risk for vascular demential given stroke and cardiac history -recommended avoiding processed foods such as added sugar, bread/pasta/rice -discussed that insulin resistance can worsen cognitive impairment -recommended eating fruits, vegetables, lean meats, olive oil, nuts, and yogurt. Highlighted salmon, blueberries and walnuts as foods that enhance cognition  10) Pain from spasticity:  --Provided with a pain relief journal and discussed that it contains foods and lifestyle tips to naturally help to improve pain. Discussed that these lifestyle strategies are also very good for health unlike some medications which can have  negative side effects. Discussed that the act of keeping a journal can be therapeutic and helpful to realize patterns what helps to trigger and alleviate pain.    11) Depression: -referred to Dr. Sima Matas, neuropsych  12) decreased appetite: -discussed mirtazepine but patient defers at this time

## 2022-10-17 ENCOUNTER — Emergency Department (HOSPITAL_COMMUNITY): Payer: Commercial Managed Care - HMO

## 2022-10-17 ENCOUNTER — Other Ambulatory Visit: Payer: Self-pay

## 2022-10-17 ENCOUNTER — Emergency Department (HOSPITAL_COMMUNITY)
Admission: EM | Admit: 2022-10-17 | Discharge: 2022-10-17 | Disposition: A | Discharge status: Skilled Nursing Facility | Payer: Commercial Managed Care - HMO | Attending: Student | Admitting: Student

## 2022-10-17 DIAGNOSIS — Z1152 Encounter for screening for COVID-19: Secondary | ICD-10-CM | POA: Insufficient documentation

## 2022-10-17 DIAGNOSIS — R110 Nausea: Secondary | ICD-10-CM | POA: Diagnosis not present

## 2022-10-17 DIAGNOSIS — K5792 Diverticulitis of intestine, part unspecified, without perforation or abscess without bleeding: Secondary | ICD-10-CM

## 2022-10-17 DIAGNOSIS — F419 Anxiety disorder, unspecified: Secondary | ICD-10-CM | POA: Insufficient documentation

## 2022-10-17 DIAGNOSIS — R1032 Left lower quadrant pain: Secondary | ICD-10-CM | POA: Insufficient documentation

## 2022-10-17 DIAGNOSIS — R079 Chest pain, unspecified: Secondary | ICD-10-CM | POA: Insufficient documentation

## 2022-10-17 DIAGNOSIS — Z7901 Long term (current) use of anticoagulants: Secondary | ICD-10-CM | POA: Diagnosis not present

## 2022-10-17 DIAGNOSIS — I10 Essential (primary) hypertension: Secondary | ICD-10-CM | POA: Insufficient documentation

## 2022-10-17 DIAGNOSIS — R1012 Left upper quadrant pain: Secondary | ICD-10-CM | POA: Insufficient documentation

## 2022-10-17 DIAGNOSIS — Z79899 Other long term (current) drug therapy: Secondary | ICD-10-CM | POA: Diagnosis not present

## 2022-10-17 LAB — URINALYSIS, ROUTINE W REFLEX MICROSCOPIC
Bilirubin Urine: NEGATIVE
Glucose, UA: NEGATIVE mg/dL
Hgb urine dipstick: NEGATIVE
Ketones, ur: 5 mg/dL — AB
Leukocytes,Ua: NEGATIVE
Nitrite: NEGATIVE
Protein, ur: NEGATIVE mg/dL
Specific Gravity, Urine: 1.014 (ref 1.005–1.030)
pH: 7 (ref 5.0–8.0)

## 2022-10-17 LAB — RESP PANEL BY RT-PCR (FLU A&B, COVID) ARPGX2
Influenza A by PCR: NEGATIVE
Influenza B by PCR: NEGATIVE
SARS Coronavirus 2 by RT PCR: NEGATIVE

## 2022-10-17 LAB — CBC WITH DIFFERENTIAL/PLATELET
Abs Immature Granulocytes: 0.01 10*3/uL (ref 0.00–0.07)
Basophils Absolute: 0 10*3/uL (ref 0.0–0.1)
Basophils Relative: 1 %
Eosinophils Absolute: 0 10*3/uL (ref 0.0–0.5)
Eosinophils Relative: 1 %
HCT: 37.9 % (ref 36.0–46.0)
Hemoglobin: 12.6 g/dL (ref 12.0–15.0)
Immature Granulocytes: 0 %
Lymphocytes Relative: 28 %
Lymphs Abs: 1.2 10*3/uL (ref 0.7–4.0)
MCH: 28.2 pg (ref 26.0–34.0)
MCHC: 33.2 g/dL (ref 30.0–36.0)
MCV: 84.8 fL (ref 80.0–100.0)
Monocytes Absolute: 0.3 10*3/uL (ref 0.1–1.0)
Monocytes Relative: 6 %
Neutro Abs: 2.8 10*3/uL (ref 1.7–7.7)
Neutrophils Relative %: 64 %
Platelets: 203 10*3/uL (ref 150–400)
RBC: 4.47 MIL/uL (ref 3.87–5.11)
RDW: 12.8 % (ref 11.5–15.5)
WBC: 4.4 10*3/uL (ref 4.0–10.5)
nRBC: 0 % (ref 0.0–0.2)

## 2022-10-17 LAB — BASIC METABOLIC PANEL
Anion gap: 9 (ref 5–15)
BUN: 9 mg/dL (ref 6–20)
CO2: 24 mmol/L (ref 22–32)
Calcium: 10.4 mg/dL — ABNORMAL HIGH (ref 8.9–10.3)
Chloride: 106 mmol/L (ref 98–111)
Creatinine, Ser: 0.74 mg/dL (ref 0.44–1.00)
GFR, Estimated: >60 mL/min (ref >60)
Glucose, Bld: 104 mg/dL — ABNORMAL HIGH (ref 70–99)
Potassium: 3.5 mmol/L (ref 3.5–5.1)
Sodium: 139 mmol/L (ref 135–145)

## 2022-10-17 LAB — COMPREHENSIVE METABOLIC PANEL
ALT: 13 U/L (ref 0–44)
AST: 15 U/L (ref 15–41)
Albumin: 3.8 g/dL (ref 3.5–5.0)
Alkaline Phosphatase: 66 U/L (ref 38–126)
Anion gap: 15 (ref 5–15)
BUN: 9 mg/dL (ref 6–20)
CO2: 22 mmol/L (ref 22–32)
Calcium: 11.3 mg/dL — ABNORMAL HIGH (ref 8.9–10.3)
Chloride: 106 mmol/L (ref 98–111)
Creatinine, Ser: 0.71 mg/dL (ref 0.44–1.00)
GFR, Estimated: >60 mL/min (ref >60)
Glucose, Bld: 110 mg/dL — ABNORMAL HIGH (ref 70–99)
Potassium: 3.5 mmol/L (ref 3.5–5.1)
Sodium: 143 mmol/L (ref 135–145)
Total Bilirubin: 0.5 mg/dL (ref 0.3–1.2)
Total Protein: 7.8 g/dL (ref 6.5–8.1)

## 2022-10-17 LAB — LACTIC ACID, PLASMA
Lactic Acid, Venous: 1 mmol/L (ref 0.5–1.9)
Lactic Acid, Venous: 0.8 mmol/L (ref 0.5–1.9)

## 2022-10-17 LAB — TROPONIN I (HIGH SENSITIVITY)
Troponin I (High Sensitivity): 4 ng/L (ref <18)
Troponin I (High Sensitivity): 5 ng/L (ref <18)

## 2022-10-17 LAB — LIPASE, BLOOD: Lipase: 31 U/L (ref 11–51)

## 2022-10-17 MED ORDER — MORPHINE SULFATE (PF) 4 MG/ML IV SOLN
4.0000 mg | Freq: Once | INTRAVENOUS | Status: AC
  Administered 2022-10-17: 4 mg via INTRAVENOUS
  Filled 2022-10-17: qty 1

## 2022-10-17 MED ORDER — ONDANSETRON HCL 4 MG PO TABS: 4.0000 mg | ORAL_TABLET | Freq: Three times a day (TID) | ORAL | 0 refills | Status: DC | PRN

## 2022-10-17 MED ORDER — OXYCODONE-ACETAMINOPHEN 5-325 MG PO TABS: 1.0000 | ORAL_TABLET | ORAL | 0 refills | Status: DC | PRN

## 2022-10-17 MED ORDER — IOHEXOL 350 MG/ML SOLN
100.0000 mL | Freq: Once | INTRAVENOUS | Status: AC | PRN
  Administered 2022-10-17: 100 mL via INTRAVENOUS

## 2022-10-17 MED ORDER — AMOXICILLIN-POT CLAVULANATE 875-125 MG PO TABS: 1.0000 | ORAL_TABLET | Freq: Two times a day (BID) | ORAL | 0 refills | Status: DC

## 2022-10-17 MED ORDER — ONDANSETRON HCL 4 MG/2ML IJ SOLN
4.0000 mg | Freq: Once | INTRAMUSCULAR | Status: AC
  Administered 2022-10-17: 4 mg via INTRAVENOUS
  Filled 2022-10-17: qty 2

## 2022-10-17 MED ORDER — AMOXICILLIN-POT CLAVULANATE 875-125 MG PO TABS
1.0000 | ORAL_TABLET | Freq: Once | ORAL | Status: AC
  Administered 2022-10-17: 1 via ORAL
  Filled 2022-10-17: qty 1

## 2022-10-17 MED ORDER — ONDANSETRON HCL 4 MG PO TABS: 4.0000 mg | ORAL_TABLET | Freq: Three times a day (TID) | ORAL | 0 refills | Status: AC | PRN

## 2022-10-17 MED ORDER — AMOXICILLIN-POT CLAVULANATE 875-125 MG PO TABS: 1.0000 | ORAL_TABLET | Freq: Two times a day (BID) | ORAL | 0 refills | Status: AC

## 2022-10-17 NOTE — ED Provider Notes (Signed)
[facility] EMERGENCY DEPARTMENT Provider Note  CSN: [id number] Arrival date & time: 10/17/22 5038  Chief Complaint(s) Chest Pain  HPI Sarah Myers is a 51 y.o. female with PMH intracerebral hemorrhage with spastic hemiplegia, bilateral PE on Eliquis, HTN, anxiety who presents emergency department for evaluation of chest pain, abdominal pain and nausea.  She states that her symptoms began abruptly with chest pain with radiation to the left arm and have since primarily localized in the left upper and left lower quadrants of the abdomen.  Patient arrives very anxious and hypertensive.  Denies diarrhea, headache, fever or other systemic symptoms.  Denies new numbness, tingling, weakness.   Past Medical History Past Medical History:  Diagnosis Date   Hypertension    Patient Active Problem List   Diagnosis Date Noted   Palliative care by specialist    Goals of care, counseling/discussion    Acute encephalopathy 06/28/2022   Bacteremia 06/28/2022   Metabolic acidosis 06/28/2022   Sepsis due to gram-negative UTI (HCC) 06/24/2022   Essential hypertension 06/24/2022   History of CVA (cerebrovascular accident) 06/24/2022   History of pulmonary embolism 06/24/2022   Anxiety and depression 06/24/2022   Acute metabolic encephalopathy 06/24/2022   Altered mental status 06/23/2022   ICH (intracerebral hemorrhage) (HCC) 04/04/2022   Hypercalcemia 03/21/2022   Oral thrush 03/13/2022   Dysphagia 03/08/2022   Counseling regarding goals of care 03/08/2022   Tracheostomy dependent (HCC)    Bilateral pulmonary embolism (HCC)    AKI (acute kidney injury) (HCC)    Acute on chronic respiratory failure with hypoxia (HCC)    Pressure injury of skin 02/07/2022   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 Authorizing Provider  acetaminophen (TYLENOL) 500 MG tablet Take 500-1,000 mg by mouth every 6 (six) hours as needed for moderate pain. Patient not taking: Reported on 09/08/2022    [provider]  amantadine (SYMMETREL) 100 MG capsule Take 1 capsule (100 mg total) by mouth 2 (two) times daily. Patient not taking: Reported on 09/08/2022 06/22/22   Horton Chin, MD  amLODipine (NORVASC) 10 MG tablet Take 1 tablet (10 mg total) by mouth daily. 06/22/22   Raulkar, Drema Pry, MD  amoxicillin (AMOXIL) 250 MG/5ML suspension Take 10 mLs (500 mg total) by mouth every 8 (eight) hours. Patient not taking: Reported on 09/08/2022 07/05/22   Leatha Gilding, MD  apixaban (ELIQUIS) 5 MG TABS tablet Take 1 tablet (5 mg total) by mouth 2 (two) times daily. Patient not taking: Reported on 09/08/2022 06/22/22   Horton Chin, MD  baclofen (LIORESAL) 10 MG tablet Take 5-15 mg by mouth 2 (two) times daily. Take 1/2 tablet (5 mg) and Take 1.5 tablets (15 mg) at bedtime Patient not taking: Reported on 09/08/2022 06/13/22   [provider]  busPIRone (BUSPAR) 5 MG tablet Take 1 tablet (5 mg total) by mouth 3 (three) times daily as needed. Patient not taking: Reported on 09/08/2022 06/23/22   Horton Chin, MD  carvedilol (COREG) 6.25 MG tablet Take 1 tablet (6.25 mg total) by mouth 2 (two) times daily with a meal. Patient not taking: Reported on 09/08/2022 06/22/22   Horton Chin, MD  famotidine (PEPCID) 20 MG tablet Take 1 tablet (  20 mg total) by mouth 2 (two) times daily. Patient not taking: Reported on 09/08/2022 05/22/22   Izora Ribas, MD  gabapentin (NEURONTIN) 100 MG capsule Take 1 capsule (100 mg total) by mouth 2 (two) times daily. Patient not taking: Reported on 09/08/2022 05/24/22   Izora Ribas, MD  hydrALAZINE (APRESOLINE) 50 MG tablet Take 1 tablet (50 mg total) by  mouth every 6 (six) hours. Patient not taking: Reported on 09/08/2022 06/22/22   Izora Ribas, MD  hydrOXYzine (ATARAX) 10 MG tablet Take 1 tablet (10 mg total) by mouth 3 (three) times daily as needed. Patient not taking: Reported on 09/08/2022 06/09/22   Izora Ribas, MD  losartan (COZAAR) 25 MG tablet Take 1 tablet (25 mg total) by mouth daily. 06/09/22   Raulkar, Clide Deutscher, MD  magnesium gluconate (MAGONATE) 500 MG tablet Take 1 tablet (500 mg total) by mouth at bedtime. 05/22/22   Raulkar, Clide Deutscher, MD  Multiple Vitamin (MULTIVITAMIN WITH MINERALS) TABS tablet Take 1 tablet by mouth daily. 05/22/22   Raulkar, Clide Deutscher, MD  sertraline (ZOLOFT) 50 MG tablet Take 1 tablet (50 mg total) by mouth at bedtime. Patient taking differently: Take 25 mg by mouth at bedtime. 06/22/22   Raulkar, Clide Deutscher, MD  tiZANidine (ZANAFLEX) 4 MG tablet Take 1 tablet (4 mg total) by mouth at bedtime. 05/31/22   Raulkar, Clide Deutscher, MD                                                                                                                                    Past Surgical History Past Surgical History:  Procedure Laterality Date   BREAST LUMPECTOMY  1992   CESAREAN SECTION     IR GASTROSTOMY TUBE MOD SED  03/06/2022   ORIF RADIAL FRACTURE  02/10/2012   Procedure: OPEN REDUCTION INTERNAL FIXATION (ORIF) RADIAL FRACTURE;  Surgeon: Meredith Pel, MD;  Location: [place];  Service: Orthopedics;  Laterality: Right;   ORIF ULNAR FRACTURE  02/10/2012   Procedure: OPEN REDUCTION INTERNAL FIXATION (ORIF) ULNAR FRACTURE;  Surgeon: Meredith Pel, MD;  Location: [place];  Service: Orthopedics;  Laterality: Right;   TUBAL LIGATION     Family History Family History  Problem Relation Age of Onset   Diabetes Mother    Hypertension Mother    Heart disease Mother    Diabetes Maternal Aunt    Stroke Maternal Uncle    Diabetes Maternal Grandmother    Heart disease Maternal Grandmother    Diabetes Maternal  Grandfather    Heart disease Maternal Grandfather    Heart disease Paternal Grandmother    Heart disease Paternal Grandfather     Social History Social History   Tobacco Use   Smoking status: Never   Smokeless tobacco: Never  Vaping Use   Vaping Use: Never used  Substance Use Topics   Alcohol use: Not Currently    Alcohol/week:  0.0 standard drinks of alcohol   Drug use: No   Allergies Lisinopril  Review of Systems Review of Systems  Cardiovascular:  Positive for chest pain.  Gastrointestinal:  Positive for abdominal pain.  Psychiatric/Behavioral:  The patient is nervous/anxious.     Physical Exam Vital Signs  I have reviewed the triage vital signs BP (!) 181/101 (BP Location: Right Arm)   Pulse 80   Temp 98.9 F (37.2 C) (Core)   Resp 20   Ht 5\' 3"  (1.6 m)   SpO2 99%   BMI 21.99 kg/m   Physical Exam Vitals and nursing note reviewed.  Constitutional:      General: She is not in acute distress.    Appearance: She is well-developed. She is ill-appearing.  HENT:     Head: Normocephalic and atraumatic.  Eyes:     Conjunctiva/sclera: Conjunctivae normal.  Cardiovascular:     Rate and Rhythm: Normal rate and regular rhythm.     Heart sounds: No murmur heard. Pulmonary:     Effort: Pulmonary effort is normal. No respiratory distress.     Breath sounds: Normal breath sounds.  Abdominal:     Palpations: Abdomen is soft.     Tenderness: There is abdominal tenderness.  Musculoskeletal:        General: No swelling.     Cervical back: Neck supple.  Skin:    General: Skin is warm and dry.     Capillary Refill: Capillary refill takes less than 2 seconds.  Neurological:     Mental Status: She is alert.  Psychiatric:        Mood and Affect: Mood normal.     ED Results and Treatments Labs (all labs ordered are listed, but only abnormal results are displayed) Labs Reviewed  RESP PANEL BY RT-PCR (FLU A&B, COVID) ARPGX2  COMPREHENSIVE METABOLIC PANEL  CBC WITH  DIFFERENTIAL/PLATELET  LIPASE, BLOOD  URINALYSIS, ROUTINE W REFLEX MICROSCOPIC  TROPONIN I (HIGH SENSITIVITY)                                                                                                                          Radiology No results found.  Pertinent labs & imaging results that were available during my care of the patient were reviewed by me and considered in my medical decision making (see MDM for details).  Medications Ordered in ED Medications - No data to display  Procedures Procedures  (including critical care time)  Medical Decision Making / ED Course   This patient presents to the ED for concern of chest pain, abdominal pain, this involves an extensive number of treatment options, and is a complaint that carries with it a high risk of complications and morbidity.  The differential diagnosis includes pneumonia, ACS, PE, aortic dissection, diverticulitis, gastritis, anxiety  MDM: Seen in the emergency room for evaluation of multiple complaints described above.  Physical exam with left upper and left lower quadrant tenderness in the abdomen but is otherwise unremarkable.  Patient is quite anxious here in the emergency department.  At time of signout, patient is pending laboratory evaluation and CT dissection study given chest pain with extension down into the abdomen and hypotension on arrival.  Please see provider signout for continuation of work-up.  Anticipate discharge home if work-up is normal.   Additional history obtained:  -External records from outside source obtained and reviewed including: Chart review including previous notes, labs, imaging, consultation notes   Lab Tests: -I ordered, reviewed, and interpreted labs.   The pertinent results include:   Labs Reviewed  RESP PANEL BY RT-PCR (FLU A&B, COVID) ARPGX2   COMPREHENSIVE METABOLIC PANEL  CBC WITH DIFFERENTIAL/PLATELET  LIPASE, BLOOD  URINALYSIS, ROUTINE W REFLEX MICROSCOPIC  TROPONIN I (HIGH SENSITIVITY)      EKG   EKG Interpretation  Date/Time:  Tuesday October 17 2022 05:20:20 EDT Ventricular Rate:  79 PR Interval:  180 QRS Duration: 94 QT Interval:  358 QTC Calculation: 410 R Axis:   111 Text Interpretation: Normal sinus rhythm Left posterior fascicular block Abnormal ECG When compared with ECG of 01-Jul-2022 22:15, PREVIOUS ECG IS PRESENT when compared to prior, similar appearance. No STEMI Confirmed by Theda Belfast ([id number]) on 10/17/2022 8:27:51 AM         Imaging Studies ordered: I ordered imaging studies including chest x-ray I independently visualized and interpreted imaging. I agree with the radiologist interpretation  CT dissection study pending   Medicines ordered and prescription drug management: No orders of the defined types were placed in this encounter.   -I have reviewed the patients home medicines and have made adjustments as needed  Critical interventions none   Social Determinants of Health:  Factors impacting patients care include: Lives at a facility   Reevaluation: After the interventions noted above, I reevaluated the patient and found that they have :stayed the same  Co morbidities that complicate the patient evaluation  Past Medical History:  Diagnosis Date   Hypertension       Dispostion: I considered admission for this patient, and disposition ending completion of laboratory and imaging evaluation.  Please see provider signout for admission work-up.     Final Clinical Impression(s) / ED Diagnoses Final diagnoses:  None     @PCDICTATION @    , MD 10/18/22 [phone]

## 2022-10-17 NOTE — Progress Notes (Signed)
CSW spoke with patient and her daughter at bedside to complete discussion regarding discharge plan. Patient's daughter is agreeable for patient to return to [facility] with the possibility of a transfer to a different facility.  CSW spoke with Altha Harm at [facility] who agreed to assist the family and patient with a transfer.  Patient will return to [facility] via [place] to call when ready.  Madilyn Fireman, MSW, LCSW Transitions of Care  Clinical Social Worker II [phone]

## 2022-10-17 NOTE — ED Provider Notes (Signed)
7:14 AM Care assumed from Dr. Matilde Sprang.  At time of transfer of care, patient is awaiting results of CT dissection study to rule out concerning etiologies of her torso pain including ruling out large dissection, large pulmonary emboli, or other emergent etiologies.  She is also waiting on labs.   Anticipate reassessment after work-up is completed to determine disposition.   9:36 AM CT scan returned showing evidence of acute diverticulitis likely causing her left-sided pain.  There is also a small pleural effusion and pericardial effusion.  Initial troponin is normal and will trend.  Lactic acid normal, will trend.  CBC reassuring with no leukocytosis and urinalysis shows no UTI.  Her CT scan also showed no evidence of pulmonary emboli, aortic pathology, or other complication with her diverticulitis such as perforation abscess or obstruction.  We will give a dose of Augmentin and will wait for the labs to return.  If they return reassuring, plan of care will be to discharge with antibiotics, pain medicine, nausea medicine, and close follow-up.   Patient tolerated medication and repeat troponin and lactic acid were normal.  Repeat metabolic panel also showed normal kidney function.  Patient and family are in agreement with plan and will discharge with return for medicine, nausea medicine.  They will follow with PCP and understood return precautions.  Patient discharged in good condition.   Clinical Impression: 1. Diverticulitis     Disposition: Discharge  Condition: Good  I have discussed the results, Dx and Tx plan with the pt(& family if present). He/she/they expressed understanding and agree(s) with the plan. Discharge instructions discussed at great length. Strict return precautions discussed and pt &/or family have verbalized understanding of the instructions. No further questions at time of discharge.    New Prescriptions   AMOXICILLIN-CLAVULANATE (AUGMENTIN) 875-125 MG TABLET    Take  1 tablet by mouth every 12 (twelve) hours for 10 days.   ONDANSETRON (ZOFRAN) 4 MG TABLET    Take 1 tablet (4 mg total) by mouth every 8 (eight) hours as needed for nausea or vomiting.   OXYCODONE-ACETAMINOPHEN (PERCOCET/ROXICET) 5-325 MG TABLET    Take 1 tablet by mouth every 4 (four) hours as needed for severe pain.    Follow Up: Rosaria Ferries, MD [place] [address] [phone]     [facility] [address] [id number] [place]             [facility] [place], Gwenyth Allegra, MD 10/17/22 1038

## 2022-10-17 NOTE — ED Triage Notes (Signed)
Patient BIB EMS from [facility] for evaluation of chest pain and pain to L arm.  Reports pain started 1 hour PTA.  Hx of stroke in February with L sided residual.  Pt is alert and oriented.  Has "memory issues" due to previous stroke.  Was given ASA 324 mg PO PTA.  EKG was unremarkable prior to arrival

## 2022-10-17 NOTE — Discharge Instructions (Addendum)
Your history, exam, work-up today led the previous team to get imaging to rule out concerning causes of your pain and they found acute diverticulitis.  This appears to be the cause of your symptoms.  Please use the antibiotics for the next 10 days and use the pain and nausea medicine to help with symptom control.  Please rest and stay hydrated.  Otherwise your labs were reassuring overall and your kidney function was normal, your heart enzyme called troponin was negative twice, and your lactic acid was normal both times it was checked.  They did not see any evidence of aortic problem or evidence of pulmonary embolism.  Please continue your other home medications and follow-up with your primary doctor.  If any symptoms change or worsen acutely, please return to the nearest emergency department.

## 2022-10-18 ENCOUNTER — Emergency Department (HOSPITAL_COMMUNITY)
Admission: EM | Admit: 2022-10-18 | Discharge: 2022-10-18 | Disposition: A | Discharge status: Skilled Nursing Facility | Payer: Commercial Managed Care - HMO | Attending: Student | Admitting: Student

## 2022-10-18 ENCOUNTER — Emergency Department (HOSPITAL_COMMUNITY): Payer: Commercial Managed Care - HMO

## 2022-10-18 ENCOUNTER — Encounter (HOSPITAL_COMMUNITY): Payer: Self-pay

## 2022-10-18 DIAGNOSIS — R509 Fever, unspecified: Secondary | ICD-10-CM | POA: Insufficient documentation

## 2022-10-18 DIAGNOSIS — I10 Essential (primary) hypertension: Secondary | ICD-10-CM | POA: Diagnosis not present

## 2022-10-18 DIAGNOSIS — S0990XA Unspecified injury of head, initial encounter: Secondary | ICD-10-CM

## 2022-10-18 DIAGNOSIS — S0081XA Abrasion of other part of head, initial encounter: Secondary | ICD-10-CM | POA: Insufficient documentation

## 2022-10-18 DIAGNOSIS — M25532 Pain in left wrist: Secondary | ICD-10-CM | POA: Insufficient documentation

## 2022-10-18 DIAGNOSIS — Z86718 Personal history of other venous thrombosis and embolism: Secondary | ICD-10-CM | POA: Diagnosis not present

## 2022-10-18 DIAGNOSIS — Y92129 Unspecified place in nursing home as the place of occurrence of the external cause: Secondary | ICD-10-CM | POA: Diagnosis not present

## 2022-10-18 DIAGNOSIS — Z7901 Long term (current) use of anticoagulants: Secondary | ICD-10-CM | POA: Insufficient documentation

## 2022-10-18 DIAGNOSIS — M542 Cervicalgia: Secondary | ICD-10-CM | POA: Insufficient documentation

## 2022-10-18 DIAGNOSIS — W19XXXA Unspecified fall, initial encounter: Secondary | ICD-10-CM

## 2022-10-18 DIAGNOSIS — W06XXXA Fall from bed, initial encounter: Secondary | ICD-10-CM | POA: Diagnosis not present

## 2022-10-18 LAB — CBC WITH DIFFERENTIAL/PLATELET
Abs Immature Granulocytes: 0.01 10*3/uL (ref 0.00–0.07)
Basophils Absolute: 0.1 10*3/uL (ref 0.0–0.1)
Basophils Relative: 1 %
Eosinophils Absolute: 0 10*3/uL (ref 0.0–0.5)
Eosinophils Relative: 1 %
HCT: 40.9 % (ref 36.0–46.0)
Hemoglobin: 13.8 g/dL (ref 12.0–15.0)
Immature Granulocytes: 0 %
Lymphocytes Relative: 32 %
Lymphs Abs: 1.8 10*3/uL (ref 0.7–4.0)
MCH: 28.2 pg (ref 26.0–34.0)
MCHC: 33.7 g/dL (ref 30.0–36.0)
MCV: 83.6 fL (ref 80.0–100.0)
Monocytes Absolute: 0.3 10*3/uL (ref 0.1–1.0)
Monocytes Relative: 5 %
Neutro Abs: 3.4 10*3/uL (ref 1.7–7.7)
Neutrophils Relative %: 61 %
Platelets: 252 10*3/uL (ref 150–400)
RBC: 4.89 MIL/uL (ref 3.87–5.11)
RDW: 13.1 % (ref 11.5–15.5)
WBC: 5.6 10*3/uL (ref 4.0–10.5)
nRBC: 0 % (ref 0.0–0.2)

## 2022-10-18 LAB — COMPREHENSIVE METABOLIC PANEL
ALT: 12 U/L (ref 0–44)
AST: 18 U/L (ref 15–41)
Albumin: 4.2 g/dL (ref 3.5–5.0)
Alkaline Phosphatase: 71 U/L (ref 38–126)
Anion gap: 12 (ref 5–15)
BUN: 12 mg/dL (ref 6–20)
CO2: 20 mmol/L — ABNORMAL LOW (ref 22–32)
Calcium: 11.8 mg/dL — ABNORMAL HIGH (ref 8.9–10.3)
Chloride: 110 mmol/L (ref 98–111)
Creatinine, Ser: 1.18 mg/dL — ABNORMAL HIGH (ref 0.44–1.00)
GFR, Estimated: 56 mL/min — ABNORMAL LOW (ref >60)
Glucose, Bld: 125 mg/dL — ABNORMAL HIGH (ref 70–99)
Potassium: 3.7 mmol/L (ref 3.5–5.1)
Sodium: 142 mmol/L (ref 135–145)
Total Bilirubin: 0.5 mg/dL (ref 0.3–1.2)
Total Protein: 8.3 g/dL — ABNORMAL HIGH (ref 6.5–8.1)

## 2022-10-18 MED ORDER — LORAZEPAM 2 MG/ML IJ SOLN
0.5000 mg | Freq: Once | INTRAMUSCULAR | Status: AC
  Administered 2022-10-18: 0.5 mg via INTRAVENOUS
  Filled 2022-10-18: qty 1

## 2022-10-18 NOTE — ED Notes (Signed)
Ptar called 

## 2022-10-18 NOTE — ED Notes (Signed)
Daughter Joanmarie Tsang [phone] would like an update asap

## 2022-10-18 NOTE — ED Notes (Signed)
XRAY to bedside  
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
## 2022-10-18 NOTE — Progress Notes (Signed)
Orthopedic Tech Progress Note Patient Details:  Sarah Myers 01-24-1971 [id number]  Patient ID: Warden Fillers, female   DOB: 05-02-1971, 51 y.o.   MRN: [id number] Level II; not currently needed. Vernona Rieger 10/18/2022, 3:34 AM

## 2022-10-18 NOTE — ED Notes (Signed)
Patient transported to CT 

## 2022-10-18 NOTE — ED Notes (Signed)
MD made aware of pt's elevated BP. MD stated pt was still able to be Dced and to receive BP medications at facility post DC.

## 2022-10-18 NOTE — ED Triage Notes (Signed)
BIB ([facility]) from [facility], level 2 fall on thinners.  Patient found in room on the ground, fall from bed. hit front of head, patient c/o neck pain.    EMS VS: 170/106 97% RA 76 HR CBG 120

## 2022-11-14 ENCOUNTER — Encounter
Payer: Commercial Managed Care - HMO | Attending: Physical Medicine and Rehabilitation | Admitting: Physical Medicine and Rehabilitation

## 2022-11-14 VITALS — BP 134/91 | HR 81 | Ht 63.0 in

## 2022-11-14 DIAGNOSIS — R252 Cramp and spasm: Secondary | ICD-10-CM | POA: Insufficient documentation

## 2022-11-14 MED ORDER — ONABOTULINUMTOXINA 100 UNITS IJ SOLR
400.0000 [IU] | Freq: Once | INTRAMUSCULAR | Status: AC
  Administered 2022-11-14: 400 [IU] via INTRAMUSCULAR

## 2022-11-14 NOTE — Progress Notes (Unsigned)
Botox Injection for spasticity using US guidance   Indication: Severe spasticity which interferes with ADL,mobility and/or  hygiene and is unresponsive to medication management and other conservative care Informed consent was obtained after describing risks and benefits of the procedure with the patient. This includes bleeding, bruising, infection, excessive weakness, or medication side effects. A REMS form is on file and signed.  Number of units per muscle Biceps*** FCR*** FCU*** FDS*** FDP*** FPL*** Gastrosoleus*** Hamstrings*** All injections were done after obtaining appropriate EMG activity and after negative drawback for blood. The patient tolerated the procedure well. Post procedure instructions were given. A followup appointment was made.

## 2022-11-26 ENCOUNTER — Inpatient Hospital Stay (HOSPITAL_COMMUNITY)
Admission: EM | Admit: 2022-11-26 | Discharge: 2022-12-01 | DRG: 178 | Disposition: A | Discharge status: Skilled Nursing Facility | Payer: Commercial Managed Care - HMO | Source: Skilled Nursing Facility | Attending: Family Medicine | Admitting: Family Medicine

## 2022-11-26 ENCOUNTER — Other Ambulatory Visit: Payer: Self-pay

## 2022-11-26 ENCOUNTER — Emergency Department (HOSPITAL_COMMUNITY): Payer: Commercial Managed Care - HMO

## 2022-11-26 ENCOUNTER — Encounter (HOSPITAL_COMMUNITY): Payer: Self-pay | Admitting: Emergency Medicine

## 2022-11-26 DIAGNOSIS — G9340 Encephalopathy, unspecified: Secondary | ICD-10-CM | POA: Diagnosis not present

## 2022-11-26 DIAGNOSIS — W19XXXA Unspecified fall, initial encounter: Secondary | ICD-10-CM | POA: Diagnosis present

## 2022-11-26 DIAGNOSIS — F05 Delirium due to known physiological condition: Secondary | ICD-10-CM | POA: Diagnosis not present

## 2022-11-26 DIAGNOSIS — I69254 Hemiplegia and hemiparesis following other nontraumatic intracranial hemorrhage affecting left non-dominant side: Secondary | ICD-10-CM

## 2022-11-26 DIAGNOSIS — Z888 Allergy status to other drugs, medicaments and biological substances status: Secondary | ICD-10-CM

## 2022-11-26 DIAGNOSIS — I10 Essential (primary) hypertension: Secondary | ICD-10-CM | POA: Diagnosis present

## 2022-11-26 DIAGNOSIS — F32A Depression, unspecified: Secondary | ICD-10-CM | POA: Diagnosis present

## 2022-11-26 DIAGNOSIS — Z79899 Other long term (current) drug therapy: Secondary | ICD-10-CM

## 2022-11-26 DIAGNOSIS — B9620 Unspecified Escherichia coli [E. coli] as the cause of diseases classified elsewhere: Secondary | ICD-10-CM | POA: Diagnosis present

## 2022-11-26 DIAGNOSIS — Z7901 Long term (current) use of anticoagulants: Secondary | ICD-10-CM

## 2022-11-26 DIAGNOSIS — Z86711 Personal history of pulmonary embolism: Secondary | ICD-10-CM

## 2022-11-26 DIAGNOSIS — G9341 Metabolic encephalopathy: Secondary | ICD-10-CM | POA: Diagnosis present

## 2022-11-26 DIAGNOSIS — E872 Acidosis, unspecified: Secondary | ICD-10-CM | POA: Diagnosis present

## 2022-11-26 DIAGNOSIS — U071 COVID-19: Principal | ICD-10-CM

## 2022-11-26 DIAGNOSIS — Z833 Family history of diabetes mellitus: Secondary | ICD-10-CM

## 2022-11-26 DIAGNOSIS — Z1152 Encounter for screening for COVID-19: Secondary | ICD-10-CM

## 2022-11-26 DIAGNOSIS — I69219 Unspecified symptoms and signs involving cognitive functions following other nontraumatic intracranial hemorrhage: Secondary | ICD-10-CM

## 2022-11-26 DIAGNOSIS — Z8673 Personal history of transient ischemic attack (TIA), and cerebral infarction without residual deficits: Secondary | ICD-10-CM

## 2022-11-26 DIAGNOSIS — I2699 Other pulmonary embolism without acute cor pulmonale: Secondary | ICD-10-CM | POA: Diagnosis present

## 2022-11-26 DIAGNOSIS — N3000 Acute cystitis without hematuria: Secondary | ICD-10-CM | POA: Diagnosis present

## 2022-11-26 DIAGNOSIS — Z8249 Family history of ischemic heart disease and other diseases of the circulatory system: Secondary | ICD-10-CM

## 2022-11-26 DIAGNOSIS — F419 Anxiety disorder, unspecified: Secondary | ICD-10-CM | POA: Diagnosis present

## 2022-11-26 DIAGNOSIS — E876 Hypokalemia: Secondary | ICD-10-CM | POA: Diagnosis not present

## 2022-11-26 DIAGNOSIS — Z823 Family history of stroke: Secondary | ICD-10-CM

## 2022-11-26 LAB — CBC
HCT: 42.4 % (ref 36.0–46.0)
Hemoglobin: 14 g/dL (ref 12.0–15.0)
MCH: 27.7 pg (ref 26.0–34.0)
MCHC: 33 g/dL (ref 30.0–36.0)
MCV: 83.8 fL (ref 80.0–100.0)
Platelets: 193 10*3/uL (ref 150–400)
RBC: 5.06 MIL/uL (ref 3.87–5.11)
RDW: 13.4 % (ref 11.5–15.5)
WBC: 7.6 10*3/uL (ref 4.0–10.5)
nRBC: 0 % (ref 0.0–0.2)

## 2022-11-26 LAB — I-STAT CHEM 8, ED
BUN: 16 mg/dL (ref 6–20)
Calcium, Ion: 1.26 mmol/L (ref 1.15–1.40)
Chloride: 109 mmol/L (ref 98–111)
Creatinine, Ser: 0.9 mg/dL (ref 0.44–1.00)
Glucose, Bld: 154 mg/dL — ABNORMAL HIGH (ref 70–99)
HCT: 43 % (ref 36.0–46.0)
Hemoglobin: 14.6 g/dL (ref 12.0–15.0)
Potassium: 3.8 mmol/L (ref 3.5–5.1)
Sodium: 143 mmol/L (ref 135–145)
TCO2: 24 mmol/L (ref 22–32)

## 2022-11-26 LAB — COMPREHENSIVE METABOLIC PANEL
ALT: 34 U/L (ref 0–44)
AST: 38 U/L (ref 15–41)
Albumin: 4.1 g/dL (ref 3.5–5.0)
Alkaline Phosphatase: 84 U/L (ref 38–126)
Anion gap: 11 (ref 5–15)
BUN: 14 mg/dL (ref 6–20)
CO2: 23 mmol/L (ref 22–32)
Calcium: 10.7 mg/dL — ABNORMAL HIGH (ref 8.9–10.3)
Chloride: 108 mmol/L (ref 98–111)
Creatinine, Ser: 0.96 mg/dL (ref 0.44–1.00)
GFR, Estimated: >60 mL/min (ref >60)
Glucose, Bld: 153 mg/dL — ABNORMAL HIGH (ref 70–99)
Potassium: 3.9 mmol/L (ref 3.5–5.1)
Sodium: 142 mmol/L (ref 135–145)
Total Bilirubin: 0.4 mg/dL (ref 0.3–1.2)
Total Protein: 8.1 g/dL (ref 6.5–8.1)

## 2022-11-26 LAB — URINALYSIS, ROUTINE W REFLEX MICROSCOPIC
Bilirubin Urine: NEGATIVE
Glucose, UA: NEGATIVE mg/dL
Hgb urine dipstick: NEGATIVE
Ketones, ur: 20 mg/dL — AB
Leukocytes,Ua: NEGATIVE
Nitrite: NEGATIVE
Protein, ur: 100 mg/dL — AB
Specific Gravity, Urine: 1.02 (ref 1.005–1.030)
pH: 5 (ref 5.0–8.0)

## 2022-11-26 LAB — SARS CORONAVIRUS 2 BY RT PCR: SARS Coronavirus 2 by RT PCR: POSITIVE — AB

## 2022-11-26 LAB — ETHANOL: Alcohol, Ethyl (B): <10 mg/dL (ref <10)

## 2022-11-26 LAB — LACTIC ACID, PLASMA
Lactic Acid, Venous: 2.5 mmol/L (ref 0.5–1.9)
Lactic Acid, Venous: 1.4 mmol/L (ref 0.5–1.9)

## 2022-11-26 LAB — MAGNESIUM: Magnesium: 2 mg/dL (ref 1.7–2.4)

## 2022-11-26 LAB — PROTIME-INR
INR: 1.2 (ref 0.8–1.2)
Prothrombin Time: 15 seconds (ref 11.4–15.2)

## 2022-11-26 LAB — CK: Total CK: 192 U/L (ref 38–234)

## 2022-11-26 MED ORDER — LORAZEPAM 2 MG/ML IJ SOLN
INTRAMUSCULAR | Status: AC
  Filled 2022-11-26: qty 1

## 2022-11-26 MED ORDER — LORAZEPAM 2 MG/ML IJ SOLN
1.0000 mg | Freq: Once | INTRAMUSCULAR | Status: AC
  Administered 2022-11-26: 1 mg via INTRAVENOUS
  Filled 2022-11-26: qty 1

## 2022-11-26 MED ORDER — LACTATED RINGERS IV BOLUS
500.0000 mL | Freq: Once | INTRAVENOUS | Status: AC
  Administered 2022-11-26: 500 mL via INTRAVENOUS

## 2022-11-26 MED ORDER — HALOPERIDOL LACTATE 5 MG/ML IJ SOLN
2.0000 mg | Freq: Once | INTRAMUSCULAR | Status: AC
  Administered 2022-11-26: 2 mg via INTRAVENOUS
  Filled 2022-11-26: qty 1

## 2022-11-26 MED ORDER — LORAZEPAM 2 MG/ML IJ SOLN
1.0000 mg | Freq: Once | INTRAMUSCULAR | Status: AC
  Administered 2022-11-26: 1 mg via INTRAVENOUS

## 2022-11-26 MED ORDER — LABETALOL HCL 5 MG/ML IV SOLN
10.0000 mg | Freq: Once | INTRAVENOUS | Status: AC
  Administered 2022-11-26: 10 mg via INTRAVENOUS
  Filled 2022-11-26: qty 4

## 2022-11-26 NOTE — ED Notes (Signed)
Pts daughter updated on plan of care.

## 2022-11-26 NOTE — ED Notes (Signed)
Daughter updated on phone 

## 2022-11-26 NOTE — ED Triage Notes (Signed)
Pt arrives via EMS from [place] with reports of unwitnessed fall, facility does have mats besides pts bed due to frequent falls. Pt agitated and pulling at ccollar. Pt not talking or following commands.EDP at bedside

## 2022-11-26 NOTE — ED Provider Notes (Signed)
[facility] EMERGENCY DEPARTMENT Provider Note   CSN: [id number] Arrival date & time: 11/26/22  1208     History  Chief Complaint  Patient presents with   Altered Mental Status   Fall    Sarah Myers is a 51 y.o. female.  HPI Patient presents for fall.  Medical history includes HTN, anemia, anxiety, ICH, CVA, PE.  She arrives via EMS from [facility] nursing facility.  Fall was unwitnessed.  She was found on the floor next to the bed.  Staff report that she had altered mental status.  They were not able to provide further details of her mental baseline.  Per chart review, she has has been seen in the ED multiple times over the past several months.  Does appear that she has baseline agitation and combativeness.  Today, patient was agitated and combative with EMS.  She was given 5 mg of Versed.  She has since been sedated.  Although nursing facility reported hypoxia on scene, EMS noted SpO2 of 95% on room air.  She would have momentary desaturations and she was placed on supplemental oxygen during transit.  Cervical collar was placed.  No other interventions were given prior to arrival.  Patient is unable to provide history on arrival.    Home Medications Prior to Admission medications   Medication Sig Start Date End Date Taking? Authorizing Provider  acetaminophen (TYLENOL) 500 MG tablet Take 1,000 mg by mouth every 6 (six) hours as needed for moderate pain.   Yes [provider]  Amantadine HCl 100 MG tablet Take 100 mg by mouth 2 (two) times daily. 09/15/22  Yes [provider]  amLODipine (NORVASC) 10 MG tablet Take 1 tablet (10 mg total) by mouth daily. 06/22/22  Yes Raulkar, Drema Pry, MD  apixaban (ELIQUIS) 5 MG TABS tablet Take 1 tablet (5 mg total) by mouth 2 (two) times daily. 06/22/22  Yes Raulkar, Drema Pry, MD  ascorbic acid (VITAMIN C) 500 MG tablet Take 500 mg by mouth 2 (two) times daily.   Yes [provider]  b complex  vitamins capsule Take 1 capsule by mouth daily. For Covid   Yes [provider]  baclofen (LIORESAL) 10 MG tablet Take 15 mg by mouth 2 (two) times daily. 06/13/22  Yes [provider]  carvedilol (COREG) 6.25 MG tablet Take 1 tablet (6.25 mg total) by mouth 2 (two) times daily with a meal. 06/22/22  Yes Raulkar, Drema Pry, MD  cholecalciferol (VITAMIN D3) 25 MCG (1000 UNIT) tablet Take 1,000 Units by mouth daily.   Yes [provider]  famotidine (PEPCID) 20 MG tablet Take 1 tablet (20 mg total) by mouth 2 (two) times daily. 05/22/22  Yes Raulkar, Drema Pry, MD  gabapentin (NEURONTIN) 100 MG capsule Take 1 capsule (100 mg total) by mouth 2 (two) times daily. 05/24/22  Yes Raulkar, Drema Pry, MD  hydrALAZINE (APRESOLINE) 25 MG tablet Take 25 mg by mouth every 6 (six) hours. 10/10/22  Yes [provider]  losartan (COZAAR) 25 MG tablet Take 1 tablet (25 mg total) by mouth daily. 06/09/22  Yes Raulkar, Drema Pry, MD  magnesium gluconate (MAGONATE) 500 MG tablet Take 1 tablet (500 mg total) by mouth at bedtime. 05/22/22  Yes Raulkar, Drema Pry, MD  mirtazapine (REMERON) 7.5 MG tablet Take 7.5 mg by mouth at bedtime.   Yes [provider]  Multiple Vitamin (MULTIVITAMIN WITH MINERALS) TABS tablet Take 1 tablet by mouth daily. 05/22/22  Yes  Horton Chin, MD  Multiple Vitamins-Minerals (ZINC PO) Take 1 tablet by mouth daily.   Yes [provider]  Nutritional Supplements (ADULT NUTRITIONAL SUPPLEMENT PO) Take 1 Dose by mouth 4 (four) times daily. [employer] shake   Yes [provider]  ondansetron (ZOFRAN) 4 MG tablet Take 1 tablet (4 mg total) by mouth every 8 (eight) hours as needed for nausea or vomiting. 10/17/22  Yes Tegeler, Canary Brim, MD  oxyCODONE-acetaminophen (PERCOCET/ROXICET) 5-325 MG tablet Take 1 tablet by mouth every 4 (four) hours as needed for severe pain. 10/17/22  Yes Tegeler, Canary Brim, MD  PAXLOVID, 300/100, 20 x  150 MG & 10 x  TBPK Take 1 tablet by mouth 2 (two) times daily. For 5 days 09/02/22  Yes [provider]  sertraline (ZOLOFT) 50 MG tablet Take 1 tablet (50 mg total) by mouth at bedtime. 06/22/22  Yes Raulkar, Drema Pry, MD  busPIRone (BUSPAR) 5 MG tablet Take 1 tablet (5 mg total) by mouth 3 (three) times daily as needed. Patient not taking: Reported on 11/26/2022 06/23/22   Horton Chin, MD  hydrOXYzine (ATARAX) 10 MG tablet Take 1 tablet (10 mg total) by mouth 3 (three) times daily as needed. Patient not taking: Reported on 11/26/2022 06/09/22   Horton Chin, MD  tiZANidine (ZANAFLEX) 4 MG tablet Take 1 tablet (4 mg total) by mouth at bedtime. Patient not taking: Reported on 11/26/2022 05/31/22   Raulkar, Drema Pry, MD      Allergies    Lisinopril    Review of Systems   Review of Systems  Unable to perform ROS: Mental status change    Physical Exam Updated Vital Signs BP (!) 134/100   Pulse 71   Temp 97.7 F (36.5 C) (Temporal)   Resp 19   Ht  (1.6 m)   Wt 56 kg   SpO2 94%   BMI 21.87 kg/m  Physical Exam Vitals and nursing note reviewed.  Constitutional:      General: She is not in acute distress.    Appearance: She is well-developed. She is not toxic-appearing or diaphoretic.  HENT:     Head: Normocephalic and atraumatic.     Right Ear: External ear normal.     Left Ear: External ear normal.     Nose: Nose normal.     Mouth/Throat:     Mouth: Mucous membranes are moist.  Eyes:     Conjunctiva/sclera: Conjunctivae normal.     Pupils: Pupils are equal, round, and reactive to light.  Neck:     Comments: Cervical collar in place Cardiovascular:     Rate and Rhythm: Normal rate and regular rhythm.     Heart sounds: No murmur heard. Pulmonary:     Effort: Pulmonary effort is normal. No respiratory distress.     Breath sounds: Normal breath sounds. No wheezing or rales.  Chest:     Chest wall: No tenderness.  Abdominal:     General: There  is no distension.     Palpations: Abdomen is soft.     Tenderness: There is no abdominal tenderness.  Musculoskeletal:        General: No swelling.     Cervical back: Neck supple.     Right lower leg: No edema.     Left lower leg: No edema.  Skin:    General: Skin is warm and dry.     Coloration: Skin is not jaundiced or pale.  Neurological:  GCS: GCS eye subscore is 1. GCS verbal subscore is 1. GCS motor subscore is 5.     Comments: Left arm contracture  Psychiatric:        Mood and Affect: Mood normal.     ED Results / Procedures / Treatments   Labs (all labs ordered are listed, but only abnormal results are displayed) Labs Reviewed  SARS CORONAVIRUS 2 BY RT PCR - Abnormal; Notable for the following components:      Result Value   SARS Coronavirus 2 by RT PCR POSITIVE (*)    All other components within normal limits  COMPREHENSIVE METABOLIC PANEL - Abnormal; Notable for the following components:   Glucose, Bld 153 (*)    Calcium 10.7 (*)    All other components within normal limits  URINALYSIS, ROUTINE W REFLEX MICROSCOPIC - Abnormal; Notable for the following components:   APPearance HAZY (*)    Ketones, ur 20 (*)    Protein, ur 100 (*)    Bacteria, UA MANY (*)    All other components within normal limits  LACTIC ACID, PLASMA - Abnormal; Notable for the following components:   Lactic Acid, Venous 2.5 (*)    All other components within normal limits  I-STAT CHEM 8, ED - Abnormal; Notable for the following components:   Glucose, Bld 154 (*)    All other components within normal limits  CBC  ETHANOL  PROTIME-INR  CK  MAGNESIUM  LACTIC ACID, PLASMA    EKG EKG Interpretation  Date/Time:  Sunday November 26 2022 12:11:43 EST Ventricular Rate:  91 PR Interval:  196 QRS Duration: 107 QT Interval:  380 QTC Calculation: 468 R Axis:   119 Text Interpretation: Sinus rhythm Right atrial enlargement Left posterior fascicular block Anterior infarct, old Confirmed  by Gloris Manchester [phone]) on 11/26/2022 12:54:51 PM  Radiology CT HEAD WO CONTRAST  Result Date: 11/26/2022 CLINICAL DATA:  Polytrauma. EXAM: CT HEAD WITHOUT CONTRAST CT CERVICAL SPINE WITHOUT CONTRAST TECHNIQUE: Multidetector CT imaging of the head and cervical spine was performed following the standard protocol without intravenous contrast. Multiplanar CT image reconstructions of the cervical spine were also generated. RADIATION DOSE REDUCTION: This exam was performed according to the departmental dose-optimization program which includes automated exposure control, adjustment of the mA and/or kV according to patient size and/or use of iterative reconstruction technique. COMPARISON:  None Available. 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 No evidence of hemorrhage. No hydrocephalus. No extra-axial fluid collection. Likely findings of transneural degeneration in the right cereberal peduncle. Vascular: No hyperdense vessel or unexpected calcification. Skull: Normal. Negative for fracture or focal lesion. Sinuses/Orbits: No acute finding. Unchanged appearance of the bilateral ethmoid sinuses with a somewhat expansile appearance. Other: Odontogenic disease with a periapical lucency along a right maxillary molar CT CERVICAL SPINE FINDINGS Alignment: Straightening of the normal cervical lordosis. Skull base and vertebrae: No acute fracture. No primary bone lesion or focal pathologic process. Soft tissues and spinal canal: No prevertebral fluid or swelling. No visible canal hematoma. Disc levels:  No evidence of high-grade spinal canal stenosis. Upper chest: Negative. Other: None IMPRESSION: 1. No acute intracranial abnormality. Sequela of moderate chronic microvascular ischemic change with likely chronic infarcts in the right corona radiata and the left posterior  temporal lobe. 2. No acute cervical spine fracture or traumatic listhesis. Electronically Signed   By: Elige Radon.D.  On: 11/26/2022 13:44   CT CERVICAL SPINE WO CONTRAST  Result Date: 11/26/2022 CLINICAL DATA:  Polytrauma. EXAM: CT HEAD WITHOUT CONTRAST CT CERVICAL SPINE WITHOUT CONTRAST TECHNIQUE: Multidetector CT imaging of the head and cervical spine was performed following the standard protocol without intravenous contrast. Multiplanar CT image reconstructions of the cervical spine were also generated. RADIATION DOSE REDUCTION: This exam was performed according to the departmental dose-optimization program which includes automated exposure control, adjustment of the mA and/or kV according to patient size and/or use of iterative reconstruction technique. COMPARISON:  None Available. 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 No evidence of hemorrhage. No hydrocephalus. No extra-axial fluid collection. Likely findings of transneural degeneration in the right cereberal peduncle. Vascular: No hyperdense vessel or unexpected calcification. Skull: Normal. Negative for fracture or focal lesion. Sinuses/Orbits: No acute finding. Unchanged appearance of the bilateral ethmoid sinuses with a somewhat expansile appearance. Other: Odontogenic disease with a periapical lucency along a right maxillary molar CT CERVICAL SPINE FINDINGS Alignment: Straightening of the normal cervical lordosis. Skull base and vertebrae: No acute fracture. No primary bone lesion or focal pathologic process. Soft tissues and spinal canal: No prevertebral fluid or swelling. No visible canal hematoma. Disc levels:  No evidence of high-grade spinal canal stenosis. Upper chest: Negative. Other: None IMPRESSION: 1. No acute intracranial abnormality. Sequela of moderate chronic microvascular  ischemic change with likely chronic infarcts in the right corona radiata and the left posterior temporal lobe. 2. No acute cervical spine fracture or traumatic listhesis. Electronically Signed   By: Lorenza Cambridge M.D.   On: 11/26/2022 13:44   DG Pelvis Portable  Result Date: 11/26/2022 CLINICAL DATA:  Fall. EXAM: PORTABLE PELVIS 1-2 VIEWS COMPARISON:  03/22/2022 FINDINGS: Minimal symmetric degenerative changes of the hips. No evidence of acute fracture or dislocation. Remainder the exam is unchanged. IMPRESSION: 1. No acute findings. 2. Minimal symmetric degenerative changes of the hips. Electronically Signed   By: Elberta Fortis M.D.   On: 11/26/2022 12:39   DG Chest Port 1 View  Result Date: 11/26/2022 CLINICAL DATA:  Fall. EXAM: PORTABLE CHEST 1 VIEW COMPARISON:  10/18/2022 FINDINGS: Lungs are adequately inflated without focal airspace consolidation, effusion or pneumothorax. Cardiomediastinal silhouette is normal. Bones and soft tissues are normal. IMPRESSION: No active disease. Electronically Signed   By: Elberta Fortis M.D.   On: 11/26/2022 12:37    Procedures Procedures    Medications Ordered in ED Medications  LORazepam (ATIVAN) 2 MG/ML injection (has no administration in time range)  haloperidol lactate (HALDOL) injection 2 mg (2 mg Intravenous Given 11/26/22 1300)  LORazepam (ATIVAN) injection 1 mg (1 mg Intravenous Given 11/26/22 1300)  LORazepam (ATIVAN) injection 1 mg (1 mg Intravenous Given 11/26/22 1320)  LORazepam (ATIVAN) injection 1 mg (1 mg Intravenous Given 11/26/22 1324)  lactated ringers bolus 500 mL (0 mLs Intravenous Stopped 11/26/22 1530)  labetalol (NORMODYNE) injection 10 mg (10 mg Intravenous Given 11/26/22 1800)    ED Course/ Medical Decision Making/ A&P                           Medical Decision Making Amount and/or Complexity of Data Reviewed Labs: ordered. Radiology: ordered.  Risk Prescription drug management.   This patient presents to the ED for  concern of fall, this involves an extensive number of treatment options, and is a complaint  that carries with it a high risk of complications and morbidity.  The differential diagnosis includes acute injuries   Co morbidities that complicate the patient evaluation  HTN, anemia, anxiety, ICH, CVA, PE   Additional history obtained:  Additional history obtained from EMS External records from outside source obtained and reviewed including EMR   Lab Tests:  I Ordered, and personally interpreted labs.  The pertinent results include: Normal hemoglobin, no leukocytosis, mild hypercalcemia (although improved from prior labs), otherwise normal electrolytes, normal kidney function, initially elevated lactic acid that normalized on repeat.  COVID test was positive.   Imaging Studies ordered:  I ordered imaging studies including x-ray of chest and pelvis, CT of head and cervical spine I independently visualized and interpreted imaging which showed no acute findings I agree with the radiologist interpretation   Cardiac Monitoring: / EKG:  The patient was maintained on a cardiac monitor.  I personally viewed and interpreted the cardiac monitored which showed an underlying rhythm of: Sinus rhythm  Problem List / ED Course / Critical interventions / Medication management  Patient presents for fall and altered mental status.  Fall was unwitnessed.  She was found on the floor next to the bed at her nursing facility.  EMS noted agitation and combativeness on scene.  She was given 5 mg of Versed.  On arrival in the ED, she is GCS of 7.  She is protecting her airway.  Vital signs are notable for moderate hypertension.  Patient is reportedly on Eliquis.  She was made a level 2 trauma.  Trauma workup was initiated.  Early after her arrival in the ED, patient's mentation improved.  I suspect her initial somnolence was secondary to Versed.  As she woke up, patient had recurrence of agitation.  Given concern of  intracranial hemorrhage, patient required Haldol and Ativan to undergo CT scan.  Following this, she had recurrence of somnolence.  Vital signs remained normal other than her ongoing hypertension.  On CT imaging, no acute findings were identified.  Lab work is notable for lactic acidosis.  IV fluids and repeat lactic acid were ordered.  Repeat lactate was normal.  Patient had continued somnolence following Haldol and Ativan that were given in the ED.  Vital signs available for increasing hypertension.  She was given a dose of labetalol with improvement in her blood pressure.  Mentation did not improve with improved blood pressure.  I spoke with her daughter who provided further history: Patient is reportedly conversant at baseline.  She does have some mild memory deficits but otherwise mentating normally.  Patient's daughter last saw her on Thursday, at which time she was at her mental baseline.  She talked to her on the phone yesterday, during which her mother seemed normal to her.  She did note some nasal congestion when they were speaking on the phone yesterday.  Given his new history, there is concern of encephalopathy.  Patient does have history of UTI delirium.  Urine studies show no evidence of infection.  She was, however, found to be COVID-positive.  Patient was admitted for delirium in the setting of COVID-19 infection. I ordered medication including Haldol and Ativan for agitation; IV fluids for hydration; labetalol for hypertension Reevaluation of the patient after these medicines showed that the patient improved I have reviewed the patients home medicines and have made adjustments as needed   Social Determinants of Health:  Resides at skilled nursing facility         Final Clinical Impression(s) /  ED Diagnoses Final diagnoses:  Fall, initial encounter  COVID-19  Encephalopathy    Rx / DC Orders ED Discharge Orders     None         Gloris Manchester, MD 11/26/22 2137

## 2022-11-26 NOTE — H&P (Signed)
History and Physical    Sarah Myers UEK:[id number] DOB: 11-03-1971 DOA: 11/26/2022  PCP: Shayne Alken, MD  Patient coming from: NH I have personally briefly reviewed patient's old medical records in [facility]  Chief Complaint: fall unwitness / change in ms  HPI: Sarah Myers is a 51 y.o. 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  Patient unable to give any meaning full history but does respond to question and intermittently answers yes and no. Of note per family at baseline patient is able to carry on a conversation.  Patient currently denies sob/ chest pain / n/v. Of note patient also intermittently willing to conduct PE and history.   ED Course:  IN ED Vitals: 96.9, bp 156/119, hr 87, rr 17, sat 100% on ra  XTA:VWPVX rhythm , RAE no hyper acute st twave changes IVCD Cxr NAD  Pelvis:xray 1. No acute findings. 2. Minimal symmetric degenerative changes of the hips.  Labs:  UA +bacteria  +COVID Wbc 7.6, hgb 14, plt 193 Etoh ,10 Na 142, K 3.9, CL 108 glu153 cr 0.96  Lactic 2.5 , 1.4  Tx ativan , haldol  CT cervical spine : neg CTH: o acute intracranial abnormality. 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 Review of Systems: As per HPI otherwise 10 point review of systems negative.   Past Medical History:  Diagnosis Date   Hypertension     Past Surgical History:  Procedure Laterality Date   BREAST LUMPECTOMY  1992   CESAREAN SECTION     IR GASTROSTOMY TUBE MOD SED  03/06/2022   ORIF RADIAL FRACTURE  02/10/2012   Procedure: OPEN REDUCTION INTERNAL FIXATION (ORIF) RADIAL FRACTURE;  Surgeon:  Cammy Copa, MD;  Location: [facility] OR;  Service: Orthopedics;  Laterality: Right;   ORIF ULNAR FRACTURE  02/10/2012   Procedure: OPEN REDUCTION INTERNAL FIXATION (ORIF) ULNAR FRACTURE;  Surgeon: Cammy Copa, MD;  Location: [facility] OR;  Service: Orthopedics;  Laterality: Right;   TUBAL LIGATION       reports that she has never smoked. She has never used smokeless tobacco. She reports that she does not currently use alcohol. She reports that she does not use drugs.  Allergies  Allergen Reactions   Lisinopril Cough    Family History  Problem Relation Age of Onset   Diabetes Mother    Hypertension Mother    Heart disease Mother    Diabetes Maternal Aunt    Stroke Maternal Uncle    Diabetes Maternal Grandmother    Heart disease Maternal Grandmother    Diabetes Maternal Grandfather    Heart disease Maternal Grandfather    Heart disease Paternal Grandmother    Heart disease Paternal Grandfather     Prior to Admission medications   Medication Sig Start Date End Date Taking? Authorizing Provider  acetaminophen (TYLENOL) 500 MG tablet Take 1,000 mg by mouth every 6 (six) hours as needed for moderate pain.   Yes [provider]  Amantadine HCl 100 MG tablet Take 100 mg by mouth 2 (two) times daily. 09/15/22  Yes [provider]  amLODipine (NORVASC) 10 MG tablet Take 1 tablet (10 mg total)  by mouth daily. 06/22/22  Yes Raulkar, Drema Pry, MD  apixaban (ELIQUIS) 5 MG TABS tablet Take 1 tablet (5 mg total) by mouth 2 (two) times daily. 06/22/22  Yes Raulkar, Drema Pry, MD  ascorbic acid (VITAMIN C) 500 MG tablet Take 500 mg by mouth 2 (two) times daily.   Yes [provider]  b complex vitamins capsule Take 1 capsule by mouth daily. For Covid   Yes [provider]  baclofen (LIORESAL) 10 MG tablet Take 15 mg by mouth 2 (two) times daily. 06/13/22  Yes [provider]  carvedilol (COREG) 6.25 MG tablet Take 1 tablet (6.25 mg total) by mouth 2 (two)  times daily with a meal. 06/22/22  Yes Raulkar, Drema Pry, MD  cholecalciferol (VITAMIN D3) 25 MCG (1000 UNIT) tablet Take 1,000 Units by mouth daily.   Yes [provider]  famotidine (PEPCID) 20 MG tablet Take 1 tablet (20 mg total) by mouth 2 (two) times daily. 05/22/22  Yes Raulkar, Drema Pry, MD  gabapentin (NEURONTIN) 100 MG capsule Take 1 capsule (100 mg total) by mouth 2 (two) times daily. 05/24/22  Yes Raulkar, Drema Pry, MD  hydrALAZINE (APRESOLINE) 25 MG tablet Take 25 mg by mouth every 6 (six) hours. 10/10/22  Yes [provider]  losartan (COZAAR) 25 MG tablet Take 1 tablet (25 mg total) by mouth daily. 06/09/22  Yes Raulkar, Drema Pry, MD  magnesium gluconate (MAGONATE) 500 MG tablet Take 1 tablet (500 mg total) by mouth at bedtime. 05/22/22  Yes Raulkar, Drema Pry, MD  mirtazapine (REMERON) 7.5 MG tablet Take 7.5 mg by mouth at bedtime.   Yes [provider]  Multiple Vitamin (MULTIVITAMIN WITH MINERALS) TABS tablet Take 1 tablet by mouth daily. 05/22/22  Yes Raulkar, Drema Pry, MD  Multiple Vitamins-Minerals (ZINC PO) Take 1 tablet by mouth daily.   Yes [provider]  Nutritional Supplements (ADULT NUTRITIONAL SUPPLEMENT PO) Take 1 Dose by mouth 4 (four) times daily. [employer] shake   Yes [provider]  ondansetron (ZOFRAN) 4 MG tablet Take 1 tablet (4 mg total) by mouth every 8 (eight) hours as needed for nausea or vomiting. 10/17/22  Yes Tegeler, Canary Brim, MD  oxyCODONE-acetaminophen (PERCOCET/ROXICET) 5-325 MG tablet Take 1 tablet by mouth every 4 (four) hours as needed for severe pain. 10/17/22  Yes Tegeler, Canary Brim, MD  PAXLOVID, 300/100, 20 x 150 MG & 10 x 100MG  TBPK Take 1 tablet by mouth 2 (two) times daily. For 5 days 09/02/22  Yes [provider]  sertraline (ZOLOFT) 50 MG tablet Take 1 tablet (50 mg total) by mouth at bedtime. 06/22/22  Yes Raulkar, 08/23/22, MD  busPIRone (BUSPAR) 5 MG tablet Take 1 tablet  (5 mg total) by mouth 3 (three) times daily as needed. Patient not taking: Reported on 11/26/2022 06/23/22   08/24/22, MD  hydrOXYzine (ATARAX) 10 MG tablet Take 1 tablet (10 mg total) by mouth 3 (three) times daily as needed. Patient not taking: Reported on 11/26/2022 06/09/22   06/11/22, MD  tiZANidine (ZANAFLEX) 4 MG tablet Take 1 tablet (4 mg total) by mouth at bedtime. Patient not taking: Reported on 11/26/2022 05/31/22   06/02/22, MD    Physical Exam: Vitals:   11/26/22 1945 11/26/22 2000 11/26/22 2015 11/26/22 2030  BP: (!) 156/98 (!) 140/91 136/89 (!) 134/100  Pulse: 73 72 71 71  Resp: 18 (!) 21 20 19   Temp:  TempSrc:      SpO2: 96% 95% 94% 94%  Weight:      Height:        Constitutional: NAD, calm, comfortable,sleeping easily arousable Vitals:   11/26/22 1945 11/26/22 2000 11/26/22 2015 11/26/22 2030  BP: (!) 156/98 (!) 140/91 136/89 (!) 134/100  Pulse: 73 72 71 71  Resp: 18 (!) 21 20 19   Temp:      TempSrc:      SpO2: 96% 95% 94% 94%  Weight:      Height:       Eyes: deferred due to patient cooperation  ENMT: deferred due to patient cooperation  Neck: normal, supple, no masses, no thyromegaly Respiratory: clear to auscultation bilaterally, no wheezing, no crackles. Normal respiratory effort. No accessory muscle use.  Cardiovascular: Regular rate and rhythm, no murmurs / rubs / gallops. No extremity edema. 2+ pedal pulses. Abdomen: no tenderness, no masses palpated. No hepatosplenomegaly. Bowel sounds positive.  Musculoskeletal: no clubbing / cyanosis. No joint deformity upper and lower extremities. Except noted left upper and lower extremity contracture Good ROM, no contractures. Normal muscle tone.  Skin: no rashes, lesions, ulcers. No induration Neurologic: CN 2-12 grossly intact. Sensation intact,Strength 5/5 in all 4.  Psychiatric: Normal judgment and insight. Alert and oriented x 3. Normal mood.    Labs on Admission: I have  personally reviewed following labs and imaging studies  CBC: Recent Labs  Lab 11/26/22 1235 11/26/22 1247  WBC 7.6  --   HGB 14.0 14.6  HCT 42.4 43.0  MCV 83.8  --   PLT 193  --    Basic Metabolic Panel: Recent Labs  Lab 11/26/22 1235 11/26/22 1247  NA 142 143  K 3.9 3.8  CL 108 109  CO2 23  --   GLUCOSE 153* 154*  BUN 14 16  CREATININE 0.96 0.90  CALCIUM 10.7*  --   MG 2.0  --    GFR: Estimated Creatinine Clearance: 61.9 mL/min (by C-G formula based on SCr of 0.9 mg/dL). Liver Function Tests: Recent Labs  Lab 11/26/22 1235  AST 38  ALT 34  ALKPHOS 84  BILITOT 0.4  PROT 8.1  ALBUMIN 4.1   No results for input(s): "LIPASE", "AMYLASE" in the last 168 hours. No results for input(s): "AMMONIA" in the last 168 hours. Coagulation Profile: Recent Labs  Lab 11/26/22 1235  INR 1.2   Cardiac Enzymes: Recent Labs  Lab 11/26/22 1235  CKTOTAL 192   BNP (last 3 results) No results for input(s): "PROBNP" in the last 8760 hours. HbA1C: No results for input(s): "HGBA1C" in the last 72 hours. CBG: No results for input(s): "GLUCAP" in the last 168 hours. Lipid Profile: No results for input(s): "CHOL", "HDL", "LDLCALC", "TRIG", "CHOLHDL", "LDLDIRECT" in the last 72 hours. Thyroid Function Tests: No results for input(s): "TSH", "T4TOTAL", "FREET4", "T3FREE", "THYROIDAB" in the last 72 hours. Anemia Panel: No results for input(s): "VITAMINB12", "FOLATE", "FERRITIN", "TIBC", "IRON", "RETICCTPCT" in the last 72 hours. Urine analysis:    Component Value Date/Time   COLORURINE YELLOW 11/26/2022 1940   APPEARANCEUR HAZY (A) 11/26/2022 1940   LABSPEC 1.020 11/26/2022 1940   PHURINE 5.0 11/26/2022 1940   GLUCOSEU NEGATIVE 11/26/2022 1940   HGBUR NEGATIVE 11/26/2022 1940   BILIRUBINUR NEGATIVE 11/26/2022 1940   KETONESUR 20 (A) 11/26/2022 1940   PROTEINUR 100 (A) 11/26/2022 1940   UROBILINOGEN 1.0 09/20/2015 1448   NITRITE NEGATIVE 11/26/2022 1940   LEUKOCYTESUR  NEGATIVE 11/26/2022 1940    Radiological Exams on  Admission: CT HEAD WO CONTRAST  Result Date: 11/26/2022 CLINICAL DATA:  Polytrauma. EXAM: CT HEAD WITHOUT CONTRAST CT CERVICAL SPINE WITHOUT CONTRAST TECHNIQUE: Multidetector CT imaging of the head and cervical spine was performed following the standard protocol without intravenous contrast. Multiplanar CT image reconstructions of the cervical spine were also generated. RADIATION DOSE REDUCTION: This exam was performed according to the departmental dose-optimization program which includes automated exposure control, adjustment of the mA and/or kV according to patient size and/or use of iterative reconstruction technique. COMPARISON:  None Available. 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 No evidence of hemorrhage. No hydrocephalus. No extra-axial fluid collection. Likely findings of transneural degeneration in the right cereberal peduncle. Vascular: No hyperdense vessel or unexpected calcification. Skull: Normal. Negative for fracture or focal lesion. Sinuses/Orbits: No acute finding. Unchanged appearance of the bilateral ethmoid sinuses with a somewhat expansile appearance. Other: Odontogenic disease with a periapical lucency along a right maxillary molar CT CERVICAL SPINE FINDINGS Alignment: Straightening of the normal cervical lordosis. Skull base and vertebrae: No acute fracture. No primary bone lesion or focal pathologic process. Soft tissues and spinal canal: No prevertebral fluid or swelling. No visible canal hematoma. Disc levels:  No evidence of high-grade spinal canal stenosis. Upper chest: Negative. Other: None IMPRESSION: 1. No acute intracranial abnormality. 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 2. No acute cervical spine fracture or traumatic listhesis. Electronically Signed   By: Lorenza Cambridge M.D.   On: 11/26/2022 13:44   CT CERVICAL SPINE WO CONTRAST  Result Date: 11/26/2022 CLINICAL DATA:  Polytrauma. EXAM: CT HEAD WITHOUT CONTRAST CT CERVICAL SPINE WITHOUT CONTRAST TECHNIQUE: Multidetector CT imaging of the head and cervical spine was performed following the standard protocol without intravenous contrast. Multiplanar CT image reconstructions of the cervical spine were also generated. RADIATION DOSE REDUCTION: This exam was performed according to the departmental dose-optimization program which includes automated exposure control, adjustment of the mA and/or kV according to patient size and/or use of iterative reconstruction technique. COMPARISON:  None Available. 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 No evidence of hemorrhage. No hydrocephalus. No extra-axial fluid collection. Likely findings of transneural degeneration in the right cereberal peduncle. Vascular: No hyperdense vessel or unexpected calcification. Skull: Normal. Negative for fracture or focal lesion. Sinuses/Orbits: No acute finding. Unchanged appearance of the bilateral ethmoid sinuses with a somewhat expansile appearance. Other: Odontogenic disease with a periapical lucency along a right maxillary molar CT CERVICAL SPINE FINDINGS Alignment: Straightening of the normal cervical lordosis. Skull base and vertebrae: No acute fracture. No primary bone lesion or focal pathologic process. Soft tissues and spinal canal: No prevertebral fluid or swelling. No visible canal hematoma. Disc levels:  No evidence of high-grade spinal canal stenosis. Upper chest: Negative. Other: None IMPRESSION: 1. No acute intracranial abnormality. 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 2. No acute cervical spine fracture or traumatic listhesis. Electronically Signed   By: Lorenza Cambridge M.D.   On: 11/26/2022 13:44   DG Pelvis Portable  Result Date: 11/26/2022 CLINICAL DATA:  Fall. EXAM: PORTABLE PELVIS 1-2 VIEWS COMPARISON:  03/22/2022 FINDINGS:  Minimal symmetric degenerative changes of the hips. No evidence of acute fracture or dislocation. Remainder the exam is unchanged. IMPRESSION: 1. No acute findings. 2. Minimal symmetric degenerative changes of the hips. Electronically Signed   By: Elberta Fortis M.D.   On: 11/26/2022 12:39   DG Chest Port 1 View  Result Date: 11/26/2022 CLINICAL DATA:  Fall. EXAM: PORTABLE CHEST 1 VIEW COMPARISON:  10/18/2022 FINDINGS: Lungs are adequately inflated without focal airspace consolidation, effusion or pneumothorax. Cardiomediastinal silhouette is normal. Bones and soft tissues are normal. IMPRESSION: No active disease. Electronically Signed   By: Elberta Fortis M.D.   On: 11/26/2022 12:37    EKG: Independently reviewed.   Assessment/Plan  Acute Metabolic Encephalopathy  -presumed related to  Delirium  due to  COVID 19/ vs ?UTI /dehydration/ vs hypertensive encephalopathy  - Of note recurrent CVA can note be ruled out , MRI pending  -admit to progressive care  -further stroke work up base on results of mri -continue to monitor on neuro checks as able   COVID 19 infection with Encephalopathy  -no noted hypoxemia  -monitor on continuous pulse ox  - plaxovid per protocol  -monitor disease severity labs   UA abn  -? Early UTI  - f/u with urine culture   Lactic acidosis -resolved s/p ivfs  -continue gentle ivf over night   Uncontrolled HTN  -resume home regimen  - prn anti -htn medications  -possible element of encephalopathy related to uncontrolled BP  Fall unwitnessed -negative trauma evaluation   Hx  of PE -continue Eliquis   Hx of ICH with residual left sided deficit  - cognitive deficits  - CTH no new acute are of infarct  -MRI pending  - monitor on neuro checks   Anxiety  Depression  -resume home regimen as able   DVT prophylaxis: Eliquis Code Status: full Family Communication: none at beside  Disposition Plan: patient  expected to be admitted greater than 2 midnights  Consults called: n/a Admission status: progressive    Lurline Del MD [facility]  If 7PM-7AM, please contact night-coverage [email or web address] Password [facility]  11/26/2022, 9:42 PM

## 2022-11-26 NOTE — ED Notes (Signed)
Portable at bedside 

## 2022-11-26 NOTE — ED Notes (Signed)
Patient transported to CT with TRN.  

## 2022-11-26 NOTE — ED Notes (Incomplete)
Trauma Response Nurse Documentation   SPRUHA WEIGHT is a 51 y.o. female arriving to [facility] ED via [facility]  On clopidogrel 75 mg daily. Trauma was activated as a Level 2 by Dr. Durwin Nora based on the following trauma criteria Elderly patients > 65 with head trauma on anti-coagulation (excluding ASA). Trauma team at the bedside on patient arrival.   Patient cleared for CT by Dr. Durwin Nora. Pt transported to CT with trauma response nurse present to monitor. RN remained with the patient throughout their absence from the department for clinical observation.   GCS 12. E- 3 V - 4 M - 5  History   Past Medical History:  Diagnosis Date   Hypertension      Past Surgical History:  Procedure Laterality Date   BREAST LUMPECTOMY  1992   CESAREAN SECTION     IR GASTROSTOMY TUBE MOD SED  03/06/2022   ORIF RADIAL FRACTURE  02/10/2012   Procedure: OPEN REDUCTION INTERNAL FIXATION (ORIF) RADIAL FRACTURE;  Surgeon: Cammy Copa, MD;  Location: [facility] OR;  Service: Orthopedics;  Laterality: Right;   ORIF ULNAR FRACTURE  02/10/2012   Procedure: OPEN REDUCTION INTERNAL FIXATION (ORIF) ULNAR FRACTURE;  Surgeon: Cammy Copa, MD;  Location: [facility] OR;  Service: Orthopedics;  Laterality: Right;   TUBAL LIGATION         Initial Focused Assessment (If applicable, or please see trauma documentation): See MD and Primary RN documentation- Trauma activated after arrival and initial assessments.   CT's Completed:   CT Head and CT C-Spine   Interventions:  Labs IV Port xrays CT scans Meds for agitation  Plan for disposition:  {Trauma Dispo:26867}   Consults completed:  {Trauma Consults:26862} at ***.  Event Summary:  Pt had C-Collar over face and was wiggling out of it- removed and attempted to re-apply and adjust, pt combative and uncooperative, chin reddened from pt moving collar up over face and trying to pull it off- pt transported to CT without C-Collar on.  Pt agitated while  in CT- requiring more medication for sedation- trying to take off the CT velcro strap, pushing head out of head holder.  Able to complete CT after additional dose of ativan 1 mg IV    Bedside handoff with ED RN Seward Grater.    Lesle Chris Amiley Shishido  Trauma Response RN  Please call TRN at [phone] for further assistance.

## 2022-11-26 NOTE — Progress Notes (Signed)
Orthopedic Tech Progress Note Patient Details:  Sarah Myers [birth year] [id number]  Level 2 trauma, ortho tech not needed at this moment.  Patient ID: Sarah Myers, female   DOB: 05-20-1971, 51 y.o.   MRN: [id number]  Docia Furl 11/26/2022, 12:30 PM

## 2022-11-27 ENCOUNTER — Inpatient Hospital Stay (HOSPITAL_COMMUNITY): Payer: Commercial Managed Care - HMO

## 2022-11-27 DIAGNOSIS — N3000 Acute cystitis without hematuria: Secondary | ICD-10-CM | POA: Diagnosis not present

## 2022-11-27 DIAGNOSIS — B9620 Unspecified Escherichia coli [E. coli] as the cause of diseases classified elsewhere: Secondary | ICD-10-CM | POA: Diagnosis not present

## 2022-11-27 DIAGNOSIS — Z888 Allergy status to other drugs, medicaments and biological substances status: Secondary | ICD-10-CM | POA: Diagnosis not present

## 2022-11-27 DIAGNOSIS — U071 COVID-19: Secondary | ICD-10-CM | POA: Diagnosis present

## 2022-11-27 DIAGNOSIS — Z86711 Personal history of pulmonary embolism: Secondary | ICD-10-CM | POA: Diagnosis not present

## 2022-11-27 DIAGNOSIS — Z833 Family history of diabetes mellitus: Secondary | ICD-10-CM | POA: Diagnosis not present

## 2022-11-27 DIAGNOSIS — F419 Anxiety disorder, unspecified: Secondary | ICD-10-CM | POA: Diagnosis not present

## 2022-11-27 DIAGNOSIS — I69254 Hemiplegia and hemiparesis following other nontraumatic intracranial hemorrhage affecting left non-dominant side: Secondary | ICD-10-CM | POA: Diagnosis not present

## 2022-11-27 DIAGNOSIS — Z1152 Encounter for screening for COVID-19: Secondary | ICD-10-CM | POA: Diagnosis not present

## 2022-11-27 DIAGNOSIS — E872 Acidosis, unspecified: Secondary | ICD-10-CM | POA: Diagnosis not present

## 2022-11-27 DIAGNOSIS — E876 Hypokalemia: Secondary | ICD-10-CM | POA: Diagnosis not present

## 2022-11-27 DIAGNOSIS — G9341 Metabolic encephalopathy: Secondary | ICD-10-CM | POA: Diagnosis not present

## 2022-11-27 DIAGNOSIS — Z823 Family history of stroke: Secondary | ICD-10-CM | POA: Diagnosis not present

## 2022-11-27 DIAGNOSIS — Z7901 Long term (current) use of anticoagulants: Secondary | ICD-10-CM | POA: Diagnosis not present

## 2022-11-27 DIAGNOSIS — I10 Essential (primary) hypertension: Secondary | ICD-10-CM | POA: Diagnosis not present

## 2022-11-27 DIAGNOSIS — Z8249 Family history of ischemic heart disease and other diseases of the circulatory system: Secondary | ICD-10-CM | POA: Diagnosis not present

## 2022-11-27 DIAGNOSIS — Z79899 Other long term (current) drug therapy: Secondary | ICD-10-CM | POA: Diagnosis not present

## 2022-11-27 DIAGNOSIS — F32A Depression, unspecified: Secondary | ICD-10-CM | POA: Diagnosis not present

## 2022-11-27 DIAGNOSIS — W19XXXA Unspecified fall, initial encounter: Secondary | ICD-10-CM | POA: Diagnosis present

## 2022-11-27 DIAGNOSIS — F05 Delirium due to known physiological condition: Secondary | ICD-10-CM | POA: Diagnosis not present

## 2022-11-27 DIAGNOSIS — I69219 Unspecified symptoms and signs involving cognitive functions following other nontraumatic intracranial hemorrhage: Secondary | ICD-10-CM | POA: Diagnosis not present

## 2022-11-27 LAB — CBC
HCT: 39.3 % (ref 36.0–46.0)
Hemoglobin: 12.5 g/dL (ref 12.0–15.0)
MCH: 27.1 pg (ref 26.0–34.0)
MCHC: 31.8 g/dL (ref 30.0–36.0)
MCV: 85.2 fL (ref 80.0–100.0)
Platelets: 166 10*3/uL (ref 150–400)
RBC: 4.61 MIL/uL (ref 3.87–5.11)
RDW: 13.5 % (ref 11.5–15.5)
WBC: 5.4 10*3/uL (ref 4.0–10.5)
nRBC: 0 % (ref 0.0–0.2)

## 2022-11-27 LAB — COMPREHENSIVE METABOLIC PANEL
ALT: 27 U/L (ref 0–44)
AST: 29 U/L (ref 15–41)
Albumin: 3.7 g/dL (ref 3.5–5.0)
Alkaline Phosphatase: 76 U/L (ref 38–126)
Anion gap: 11 (ref 5–15)
BUN: 12 mg/dL (ref 6–20)
CO2: 22 mmol/L (ref 22–32)
Calcium: 10.6 mg/dL — ABNORMAL HIGH (ref 8.9–10.3)
Chloride: 109 mmol/L (ref 98–111)
Creatinine, Ser: 0.87 mg/dL (ref 0.44–1.00)
GFR, Estimated: >60 mL/min (ref >60)
Glucose, Bld: 79 mg/dL (ref 70–99)
Potassium: 3.4 mmol/L — ABNORMAL LOW (ref 3.5–5.1)
Sodium: 142 mmol/L (ref 135–145)
Total Bilirubin: 0.6 mg/dL (ref 0.3–1.2)
Total Protein: 7.4 g/dL (ref 6.5–8.1)

## 2022-11-27 LAB — PROTIME-INR
INR: 1.4 — ABNORMAL HIGH (ref 0.8–1.2)
Prothrombin Time: 16.6 seconds — ABNORMAL HIGH (ref 11.4–15.2)

## 2022-11-27 MED ORDER — VITAMIN C 500 MG PO TABS
500.0000 mg | ORAL_TABLET | Freq: Two times a day (BID) | ORAL | Status: DC
  Administered 2022-11-27 – 2022-12-01 (×7): 500 mg via ORAL
  Filled 2022-11-27 (×9): qty 1

## 2022-11-27 MED ORDER — ONDANSETRON HCL 4 MG PO TABS: 4.0000 mg | ORAL_TABLET | Freq: Four times a day (QID) | ORAL | Status: DC | PRN

## 2022-11-27 MED ORDER — B COMPLEX-C PO TABS
1.0000 | ORAL_TABLET | Freq: Every day | ORAL | Status: DC
  Administered 2022-11-29 – 2022-12-01 (×3): 1 via ORAL
  Filled 2022-11-27 (×5): qty 1

## 2022-11-27 MED ORDER — HYDRALAZINE HCL 20 MG/ML IJ SOLN
10.0000 mg | Freq: Four times a day (QID) | INTRAMUSCULAR | Status: DC | PRN
  Administered 2022-11-27 – 2022-11-28 (×2): 10 mg via INTRAVENOUS
  Filled 2022-11-27 (×3): qty 1

## 2022-11-27 MED ORDER — OXYCODONE-ACETAMINOPHEN 5-325 MG PO TABS
1.0000 | ORAL_TABLET | ORAL | Status: DC | PRN
  Administered 2022-11-28: 1 via ORAL
  Filled 2022-11-27: qty 1

## 2022-11-27 MED ORDER — LABETALOL HCL 5 MG/ML IV SOLN
10.0000 mg | Freq: Once | INTRAVENOUS | Status: AC
  Administered 2022-11-27: 10 mg via INTRAVENOUS
  Filled 2022-11-27: qty 4

## 2022-11-27 MED ORDER — ZINC SULFATE 220 (50 ZN) MG PO CAPS
220.0000 mg | ORAL_CAPSULE | Freq: Every day | ORAL | Status: DC
  Administered 2022-11-29 – 2022-12-01 (×3): 220 mg via ORAL
  Filled 2022-11-27 (×5): qty 1

## 2022-11-27 MED ORDER — POTASSIUM CHLORIDE 20 MEQ PO PACK: 20.0000 meq | PACK | Freq: Two times a day (BID) | ORAL | Status: DC

## 2022-11-27 MED ORDER — MIRTAZAPINE 15 MG PO TABS
7.5000 mg | ORAL_TABLET | Freq: Every day | ORAL | Status: DC
  Administered 2022-11-27 – 2022-11-30 (×4): 7.5 mg via ORAL
  Filled 2022-11-27 (×5): qty 1

## 2022-11-27 MED ORDER — AMANTADINE HCL 100 MG PO CAPS
100.0000 mg | ORAL_CAPSULE | Freq: Two times a day (BID) | ORAL | Status: DC
  Administered 2022-11-27 – 2022-12-01 (×7): 100 mg via ORAL
  Filled 2022-11-27 (×10): qty 1

## 2022-11-27 MED ORDER — LORAZEPAM 2 MG/ML IJ SOLN
0.5000 mg | Freq: Once | INTRAMUSCULAR | Status: AC
  Administered 2022-11-27: 0.5 mg via INTRAVENOUS
  Filled 2022-11-27: qty 1

## 2022-11-27 MED ORDER — GABAPENTIN 100 MG PO CAPS
100.0000 mg | ORAL_CAPSULE | Freq: Two times a day (BID) | ORAL | Status: DC
  Administered 2022-11-27 – 2022-12-01 (×7): 100 mg via ORAL
  Filled 2022-11-27 (×9): qty 1

## 2022-11-27 MED ORDER — APIXABAN 5 MG PO TABS
5.0000 mg | ORAL_TABLET | Freq: Two times a day (BID) | ORAL | Status: DC
  Administered 2022-11-27 – 2022-12-01 (×7): 5 mg via ORAL
  Filled 2022-11-27 (×9): qty 1

## 2022-11-27 MED ORDER — VITAMIN D 25 MCG (1000 UNIT) PO TABS
1000.0000 [IU] | ORAL_TABLET | Freq: Every day | ORAL | Status: DC
  Administered 2022-11-29 – 2022-12-01 (×3): 1000 [IU] via ORAL
  Filled 2022-11-27 (×5): qty 1

## 2022-11-27 MED ORDER — SERTRALINE HCL 50 MG PO TABS
50.0000 mg | ORAL_TABLET | Freq: Every day | ORAL | Status: DC
  Administered 2022-11-27 – 2022-11-30 (×4): 50 mg via ORAL
  Filled 2022-11-27 (×5): qty 1

## 2022-11-27 MED ORDER — ACETAMINOPHEN 500 MG PO TABS: 1000.0000 mg | ORAL_TABLET | Freq: Four times a day (QID) | ORAL | Status: DC | PRN

## 2022-11-27 MED ORDER — CARVEDILOL 6.25 MG PO TABS
6.2500 mg | ORAL_TABLET | Freq: Two times a day (BID) | ORAL | Status: DC
  Administered 2022-11-29 – 2022-12-01 (×6): 6.25 mg via ORAL
  Filled 2022-11-27: qty 2
  Filled 2022-11-27 (×2): qty 1
  Filled 2022-11-27: qty 2
  Filled 2022-11-27 (×5): qty 1

## 2022-11-27 MED ORDER — ALBUTEROL SULFATE (2.5 MG/3ML) 0.083% IN NEBU: 2.5000 mg | INHALATION_SOLUTION | RESPIRATORY_TRACT | Status: DC | PRN

## 2022-11-27 MED ORDER — POTASSIUM CHLORIDE 10 MEQ/100ML IV SOLN
10.0000 meq | INTRAVENOUS | Status: AC
  Administered 2022-11-27 (×2): 10 meq via INTRAVENOUS
  Filled 2022-11-27 (×2): qty 100

## 2022-11-27 MED ORDER — SODIUM CHLORIDE 0.9 % IV SOLN
1.0000 g | INTRAVENOUS | Status: DC
  Administered 2022-11-27 – 2022-11-28 (×2): 1 g via INTRAVENOUS
  Filled 2022-11-27 (×2): qty 10

## 2022-11-27 MED ORDER — SODIUM CHLORIDE 0.9 % IV SOLN: INTRAVENOUS | Status: DC

## 2022-11-27 MED ORDER — SODIUM CHLORIDE 0.9 % IV SOLN: INTRAVENOUS | Status: AC

## 2022-11-27 MED ORDER — HYDRALAZINE HCL 25 MG PO TABS
25.0000 mg | ORAL_TABLET | Freq: Four times a day (QID) | ORAL | Status: DC
  Administered 2022-11-27 – 2022-12-01 (×15): 25 mg via ORAL
  Filled 2022-11-27 (×17): qty 1

## 2022-11-27 MED ORDER — FAMOTIDINE 20 MG PO TABS
20.0000 mg | ORAL_TABLET | Freq: Two times a day (BID) | ORAL | Status: DC
  Administered 2022-11-27 – 2022-12-01 (×7): 20 mg via ORAL
  Filled 2022-11-27 (×9): qty 1

## 2022-11-27 MED ORDER — AMLODIPINE BESYLATE 10 MG PO TABS
10.0000 mg | ORAL_TABLET | Freq: Every day | ORAL | Status: DC
  Administered 2022-11-29 – 2022-12-01 (×3): 10 mg via ORAL
  Filled 2022-11-27 (×4): qty 1
  Filled 2022-11-27: qty 2

## 2022-11-27 MED ORDER — LOSARTAN POTASSIUM 50 MG PO TABS
25.0000 mg | ORAL_TABLET | Freq: Every day | ORAL | Status: DC
  Administered 2022-11-29 – 2022-12-01 (×3): 25 mg via ORAL
  Filled 2022-11-27 (×5): qty 1

## 2022-11-27 MED ORDER — ONDANSETRON HCL 4 MG/2ML IJ SOLN: 4.0000 mg | Freq: Four times a day (QID) | INTRAMUSCULAR | Status: DC | PRN

## 2022-11-27 MED ORDER — BACLOFEN 10 MG PO TABS
15.0000 mg | ORAL_TABLET | Freq: Two times a day (BID) | ORAL | Status: DC
  Administered 2022-11-27 – 2022-12-01 (×7): 15 mg via ORAL
  Filled 2022-11-27 (×9): qty 2

## 2022-11-27 NOTE — ED Notes (Signed)
Pt to MRI

## 2022-11-27 NOTE — ED Notes (Signed)
Pt returned from MRI. MRI tech states pt was unable to lay still for MRI so she was brought back to ED.

## 2022-11-27 NOTE — ED Notes (Signed)
MD paged due patients blood pressure. Awaiting new orders.

## 2022-11-27 NOTE — Progress Notes (Signed)
Consultation Progress Note   Patient: Sarah Myers LSL:[id number] DOB: [birth year] DOA: 11/26/2022 DOS: the patient was seen and examined on 11/27/2022 Primary service: DibiaElgie Collard, MD  Brief hospital course: 51 y.o. 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  Patient unable to give any meaning full history but does respond to question and intermittently answers yes and no. Of note per family at baseline patient is able to carry on a conversation.  Patient currently denies sob/ chest pain / n/v. Of note patient also intermittently willing to conduct PE and history.   Assessment and Plan: Acute Metabolic Encephalopathy  -presumed related to  Delirium  due to  COVID 19/ vs ?UTI /dehydration/ vs hypertensive encephalopathy  - Of note recurrent CVA can note be ruled out , MRI pending  -admit to progressive care  -further stroke work up base on results of mri -continue to monitor on neuro checks as able    COVID 19 infection with Encephalopathy  -no noted hypoxemia  -monitor on continuous pulse ox  - plaxovid per protocol  -monitor disease severity labs    UA abn  -? Early UTI  - f/u with urine culture  -Started on Rocephine.   Lactic acidosis -resolved s/p ivfs  -continue gentle ivf over night    Uncontrolled HTN  -resume home regimen  - prn anti -htn medications  -possible element of encephalopathy related to uncontrolled BP   Fall unwitnessed -negative trauma evaluation    Hx of PE -continue Eliquis    Hx of ICH with residual left sided deficit  - cognitive deficits  - CTH no new acute are of infarct  -MRI pending  - monitor on neuro checks    Anxiety  Depression  -resume home regimen as able    DVT prophylaxis:  Eliquis Code Status: full Family Communication: none at beside  Disposition Plan: patient  expected to be admitted greater than 2 midnights  Consults called: n/a Admission status: progressive        TRH will continue to follow the patient.  Subjective: Lethargic this morning, responds to name, Per RN patient had MRI done this morning and has been sleeping after getting Ativan. Reportedly MRI could not be completed due to restlessness despite ativan use. Holding off on further sedation for A repeat MRI. Repeat imaging could be re attempted in the morning,  Physical Exam: Vitals:   11/27/22 0807 11/27/22 0820 11/27/22 0915 11/27/22 1100  BP: (!) 171/103  (!) 162/96 (!) 154/106  Pulse:   82 78  Resp:   14 13  Temp:  98.4 F (36.9 C)    TempSrc:  Axillary    SpO2:   98% 98%  Weight:      Height:       Gen: Drowsy but arousable Neck: normal, supple, no masses, no thyromegaly Respiratory: clear to auscultation bilaterally, no wheezing, no crackles. Normal respiratory effort. No accessory muscle use.  Cardiovascular: Regular rate and rhythm, no murmurs / rubs / gallops. No extremity edema. 2+ pedal pulses. Abdomen: no tenderness, no masses palpated. No hepatosplenomegaly. Bowel sounds positive.  Musculoskeletal: no clubbing / cyanosis. No joint deformity upper and lower extremities. Except noted left upper and lower extremity contracture  Skin: no rashes, lesions, ulcers.  No induration Neurologic: CN 2-12 grossly intact.Moving all four extremities   Data Reviewed:  There are no new results to review at this time.  Family Communication:   Time spent: 15 minutes.  Author: Baldomero Lamy, MD 11/27/2022 11:05 AM  For on call review [email or web address].

## 2022-11-27 NOTE — ED Notes (Signed)
Bladder Scan [id number]

## 2022-11-27 NOTE — Progress Notes (Signed)
Patient was attempted for the second time this time with medication, could not even get the head coil on trying to pull off and grabbing at people

## 2022-11-27 NOTE — ED Notes (Signed)
PER Dr Jenetta Downer pt MRI can be done in the morning. RN made MRI aware.

## 2022-11-28 ENCOUNTER — Inpatient Hospital Stay (HOSPITAL_COMMUNITY): Payer: Commercial Managed Care - HMO

## 2022-11-28 DIAGNOSIS — U071 COVID-19: Secondary | ICD-10-CM | POA: Diagnosis not present

## 2022-11-28 DIAGNOSIS — N3000 Acute cystitis without hematuria: Secondary | ICD-10-CM | POA: Insufficient documentation

## 2022-11-28 DIAGNOSIS — E876 Hypokalemia: Secondary | ICD-10-CM | POA: Insufficient documentation

## 2022-11-28 LAB — BASIC METABOLIC PANEL
Anion gap: 10 (ref 5–15)
BUN: 9 mg/dL (ref 6–20)
CO2: 23 mmol/L (ref 22–32)
Calcium: 10.7 mg/dL — ABNORMAL HIGH (ref 8.9–10.3)
Chloride: 106 mmol/L (ref 98–111)
Creatinine, Ser: 0.75 mg/dL (ref 0.44–1.00)
GFR, Estimated: >60 mL/min (ref >60)
Glucose, Bld: 115 mg/dL — ABNORMAL HIGH (ref 70–99)
Potassium: 3.4 mmol/L — ABNORMAL LOW (ref 3.5–5.1)
Sodium: 139 mmol/L (ref 135–145)

## 2022-11-28 MED ORDER — POTASSIUM CHLORIDE CRYS ER 20 MEQ PO TBCR: 40.0000 meq | EXTENDED_RELEASE_TABLET | Freq: Once | ORAL | Status: DC

## 2022-11-28 MED ORDER — LABETALOL HCL 5 MG/ML IV SOLN
20.0000 mg | Freq: Once | INTRAVENOUS | Status: AC
  Administered 2022-11-28: 20 mg via INTRAVENOUS
  Filled 2022-11-28: qty 4

## 2022-11-28 NOTE — Assessment & Plan Note (Signed)
Resolved

## 2022-11-28 NOTE — Progress Notes (Signed)
  Progress Note   Patient: Sarah Myers NGE:[id number] DOB: 27-Oct-1971 DOA: 11/26/2022     1 DOS: the patient was seen and examined on 11/28/2022 at 11:13AM      Brief hospital course: 51 y.o. 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  Patient unable to give any meaning full history but does respond to question and intermittently answers yes and no. Of note per family at baseline patient is able to carry on a conversation.  Patient currently denies sob/ chest pain / n/v. Of note patient also intermittently willing to conduct PE and history.       Assessment and Plan: * COVID-19 Asymptomatic  Hypokalemia - Supplement K  Acute cystitis - Continue Rocephin  Acute metabolic encephalopathy Resolved  Anxiety and depression - Continue mirtazapine, sertraline  History of CVA (cerebrovascular accident) - Continue apixaban  Essential hypertension - Continue amlodipine, coreg, hydralazine, losartan  Bilateral pulmonary embolism (HCC) - Continue Eliquis          Subjective: No confusion, no fever, no respiratory distress.  No change in chronic hemiplegia.  No speech change.     Physical Exam: BP (!) 164/95 (BP Location: Right Arm)   Pulse 87   Temp 99 F (37.2 C) (Oral)   Resp 17   Ht 5\' 3"  (1.6 m)   Wt 48.1 kg   SpO2 97%   BMI 18.78 kg/m   Adult female, interactive and appropriate RRR, no murmurs, no peripheral edema Respiratory rate normal, lungs clear without rales or wheezes Abdomen soft without tenderness palpation or guarding, no ascites or distention Chronic left hemiplegia, no change Speech fluent, oriented to person, place, time, and situation      Data Reviewed: Lactic acid 2.5 Basic metabolic  panel shows mild hypokalemia    Family Communication: daughter by phone    Disposition: Status is: Inpatient Medically ready for discharge        Author: , MD 11/28/2022 3:25 PM  For on call review [email or web address].

## 2022-11-28 NOTE — Progress Notes (Signed)
Danford, MD informed RN at RN station that he will DC MRI order for pt.

## 2022-11-28 NOTE — TOC Initial Note (Signed)
Transition of Care [facility]) - Initial/Assessment Note    Patient Details  Name: Sarah Myers MRN: [id number] Date of Birth: 09/03/1971  Transition of Care [facility]) CM/SW Contact:    Mearl Latin, LCSW Phone Number: 11/28/2022, 11:27 AM  Clinical Narrative:                 Per [facility], they are aware of patient's COVID status but require insurance approval prior to discharge. CSW requested they start process as patient is medically stable.   Expected Discharge Plan: Skilled Nursing Facility Barriers to Discharge: Insurance Authorization   Patient Goals and CMS Choice Patient states their goals for this hospitalization and ongoing recovery are:: Return to SNF CMS Medicare.gov Compare Post Acute Care list provided to:: Patient Represenative (must comment) Choice offered to / list presented to : Adult Children  Expected Discharge Plan and Services Expected Discharge Plan: Skilled Nursing Facility In-house Referral: Clinical Social Work   Post Acute Care Choice: Skilled Nursing Facility Living arrangements for the past 2 months: Skilled Nursing Facility                                      Prior Living Arrangements/Services Living arrangements for the past 2 months: Skilled Nursing Facility Lives with:: Facility Resident Patient language and need for interpreter reviewed:: Yes Do you feel safe going back to the place where you live?: Yes      Need for Family Participation in Patient Care: Yes (Comment) Care giver support system in place?: Yes (comment)   Criminal Activity/Legal Involvement Pertinent to Current Situation/Hospitalization: No - Comment as needed  Activities of Daily Living      Permission Sought/Granted Permission sought to share information with : Facility Medical sales representative, Family Supports Permission granted to share information with : Yes, Verbal Permission Granted     Permission granted to share info w AGENCY: [facility]         Emotional Assessment Appearance:: Appears stated age Attitude/Demeanor/Rapport: Unable to Assess Affect (typically observed): Unable to Assess Orientation: : Oriented to Self, Oriented to Place Alcohol / Substance Use: Not Applicable Psych Involvement: No (comment)  Admission diagnosis:  Encephalopathy [G93.40] Fall, initial encounter [W19.XXXA] COVID-19 [U07.1] Patient Active Problem List   Diagnosis Date Noted   Acute cystitis 11/28/2022   Hypokalemia 11/28/2022   COVID-19 11/27/2022   Palliative care by specialist    Goals of care, counseling/discussion    Acute encephalopathy 06/28/2022   Bacteremia 06/28/2022   Metabolic acidosis 06/28/2022   Sepsis due to gram-negative UTI (HCC) 06/24/2022   Essential hypertension 06/24/2022   History of CVA (cerebrovascular accident) 06/24/2022   History of pulmonary embolism 06/24/2022   Anxiety and depression 06/24/2022   Acute metabolic encephalopathy 06/24/2022   Altered mental status 06/23/2022   ICH (intracerebral hemorrhage) (HCC) 04/04/2022   Hypercalcemia 03/21/2022   Oral thrush 03/13/2022   Dysphagia 03/08/2022   Counseling regarding goals of care 03/08/2022   Tracheostomy dependent (HCC)    Bilateral pulmonary embolism (HCC)    AKI (acute kidney injury) (HCC)    Acute on chronic respiratory failure with hypoxia (HCC)    Pressure injury of skin 02/07/2022   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 [place] [address] Phone: [phone] Fax: [phone]     Social Determinants of Health ([place]) Interventions    Readmission Risk Interventions    07/05/2022   11:29 AM 06/30/2022   12:52 PM  03/09/2022    9:54 AM  Readmission Risk Prevention Plan  Transportation Screening Complete Complete Complete  PCP or Specialist Appt within 5-7 Days  Complete   PCP or Specialist Appt within 3-5 Days Complete  Complete  Home Care Screening  Complete   Medication Review (RN CM)  Complete   HRI or Home Care Consult Complete  Not Complete  Social Work Consult for [place] Planning/Counseling Complete  Complete  Palliative Care Screening Complete    Medication Review Press photographer) Complete  Complete

## 2022-11-28 NOTE — NC FL2 (Signed)
MEDICAID FL2 LEVEL OF CARE FORM     IDENTIFICATION  Patient Name: Sarah Myers Birthdate: [birth year] Sex: female Admission Date (Current Location): 11/26/2022  [facility] and [place] Number:  Producer, television/film/video and Address:  [facility], [address] [place], [place] [address]      Provider Number: [id number]  Attending Physician Name and Address:  Alberteen Sam, *  Relative Name and Phone Number:       Current Level of Care: Hospital Recommended Level of Care: Skilled Nursing Facility Prior Approval Number:    Date Approved/Denied:   PASRR Number: [id number]  Discharge Plan: SNF    Current Diagnoses: Patient Active Problem List   Diagnosis Date Noted   Acute cystitis 11/28/2022   Hypokalemia 11/28/2022   COVID-19 11/27/2022   Palliative care by specialist    Goals of care, counseling/discussion    Acute encephalopathy 06/28/2022   Bacteremia 06/28/2022   Metabolic acidosis 06/28/2022   Sepsis due to gram-negative UTI (HCC) 06/24/2022   Essential hypertension 06/24/2022   History of CVA (cerebrovascular accident) 06/24/2022   History of pulmonary embolism 06/24/2022   Anxiety and depression 06/24/2022   Acute metabolic encephalopathy 06/24/2022   Altered mental status 06/23/2022   ICH (intracerebral hemorrhage) (HCC) 04/04/2022   Hypercalcemia 03/21/2022   Oral thrush 03/13/2022   Dysphagia 03/08/2022   Counseling regarding goals of care 03/08/2022   Tracheostomy dependent (HCC)    Bilateral pulmonary embolism (HCC)    AKI (acute kidney injury) (HCC)    Acute on chronic respiratory failure with hypoxia (HCC)    Pressure injury of skin 02/07/2022   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/05/2022   Iron deficiency anemia 06/30/2020   Vitamin D deficiency 06/30/2020   GAD (generalized anxiety disorder) 05/18/2020   Malignant hypertension 10/30/2019    Orientation RESPIRATION  BLADDER Height & Weight     Self, Place  Normal Incontinent Weight: 106 lb 0.7 oz (48.1 kg) Height:  5\' 3"  (160 cm)  BEHAVIORAL SYMPTOMS/MOOD NEUROLOGICAL BOWEL NUTRITION STATUS      Continent Diet (See dc summary)  AMBULATORY STATUS COMMUNICATION OF NEEDS Skin   Limited Assist Verbally Normal                       Personal Care Assistance Level of Assistance  Bathing, Feeding, Dressing Bathing Assistance: Maximum assistance Feeding assistance: Limited assistance Dressing Assistance: Limited assistance     Functional Limitations Info             SPECIAL CARE FACTORS FREQUENCY                       Contractures Contractures Info: Not present    Additional Factors Info  Code Status, Allergies, Psychotropic, Isolation Precautions Code Status Info: Full Allergies Info: Lisinopril Psychotropic Info: Zoloft   Isolation Precautions Info: COVID+     Current Medications (11/28/2022):  This is the current hospital active medication list Current Facility-Administered Medications  Medication Dose Route Frequency Provider Last Rate Last Admin   acetaminophen (TYLENOL) tablet 1,000 mg  1,000 mg Oral Q6H PRN 14/11/2022 A, MD       albuterol (PROVENTIL) (2.5 MG/3ML) 0.083% nebulizer solution 2.5 mg  2.5 mg Nebulization Q2H PRN Skip Mayer, MD       amantadine (SYMMETREL) capsule 100 mg  100 mg Oral BID Lurline Del A, MD   100 mg at 11/27/22  0244   amLODipine (NORVASC) tablet 10 mg  10 mg Oral Daily Lurline Del, MD       apixaban Everlene Balls) tablet 5 mg  5 mg Oral BID Skip Mayer A, MD   5 mg at 11/27/22 5462   ascorbic acid (VITAMIN C) tablet 500 mg  500 mg Oral BID Skip Mayer A, MD   500 mg at 11/27/22 0242   B-complex with vitamin C tablet 1 tablet  1 tablet Oral Daily Lurline Del, MD       baclofen (LIORESAL) tablet 15 mg  15 mg Oral BID Skip Mayer A, MD   15 mg at 11/27/22 0241   carvedilol (COREG) tablet 6.25 mg   6.25 mg Oral BID WC Skip Mayer A, MD       cefTRIAXone (ROCEPHIN) 1 g in sodium chloride 0.9 % 100 mL IVPB  1 g Intravenous Q24H Dibia, Elgie Collard, MD   Stopped at 11/27/22 1211   cholecalciferol (VITAMIN D3) 25 MCG (1000 UNIT) tablet 1,000 Units  1,000 Units Oral Daily Skip Mayer A, MD       famotidine (PEPCID) tablet 20 mg  20 mg Oral BID Skip Mayer A, MD   20 mg at 11/27/22 0243   gabapentin (NEURONTIN) capsule 100 mg  100 mg Oral BID Skip Mayer A, MD   100 mg at 11/27/22 0242   hydrALAZINE (APRESOLINE) injection 10 mg  10 mg Intravenous Q6H PRN Dibia, Elgie Collard, MD   10 mg at 11/28/22 0222   hydrALAZINE (APRESOLINE) tablet 25 mg  25 mg Oral Q6H Skip Mayer A, MD   25 mg at 11/27/22 0634   losartan (COZAAR) tablet 25 mg  25 mg Oral Daily Skip Mayer A, MD       mirtazapine (REMERON) tablet 7.5 mg  7.5 mg Oral QHS Skip Mayer A, MD   7.5 mg at 11/27/22 0243   ondansetron (ZOFRAN) tablet 4 mg  4 mg Oral Q6H PRN Lurline Del, MD       Or   ondansetron [facility]) injection 4 mg  4 mg Intravenous Q6H PRN Lurline Del, MD       oxyCODONE-acetaminophen (PERCOCET/ROXICET) 5-325 MG per tablet 1 tablet  1 tablet Oral Q4H PRN Lurline Del, MD       sertraline (ZOLOFT) tablet 50 mg  50 mg Oral QHS Skip Mayer A, MD   50 mg at 11/27/22 0242   zinc sulfate capsule 220 mg  220 mg Oral Daily Lurline Del, MD         Discharge Medications: Please see discharge summary for a list of discharge medications.  Relevant Imaging Results:  Relevant Lab Results:   Additional Information SS# [id number]  Mearl Latin, LCSW

## 2022-11-28 NOTE — Assessment & Plan Note (Signed)
Resolved with supplementation and starting spironolactone. 

## 2022-11-28 NOTE — Assessment & Plan Note (Signed)
-   Continue Eliquis 

## 2022-11-28 NOTE — Assessment & Plan Note (Signed)
-   Continue Rocephin 

## 2022-11-28 NOTE — Evaluation (Signed)
Physical Therapy Evaluation Patient Details Name: Sarah Myers MRN: [id number] DOB: 09-Jan-1971 Today's Date: 11/28/2022  History of Present Illness  Pt is 51 year old presented to [facility] on  11/27/22 with AMS and fall at facility. Pt found to be +Covid. PMH - ICH with residual lt weakness, PE, HTN, anxiety.  Clinical Impression  Pt admitted with above diagnosis and presents to PT with functional limitations due to deficits listed below (See PT problem list). Pt needs skilled PT to maximize independence and safety to allow discharge back to SNF for further therapy.         Recommendations for follow up therapy are one component of a multi-disciplinary discharge planning process, led by the attending physician.  Recommendations may be updated based on patient status, additional functional criteria and insurance authorization.  Follow Up Recommendations Skilled nursing-short term rehab (<3 hours/day) Can patient physically be transported by private vehicle: No    Assistance Recommended at Discharge Frequent or constant Supervision/Assistance  Patient can return home with the following  A lot of help with walking and/or transfers;A lot of help with bathing/dressing/bathroom    Equipment Recommendations None recommended by PT  Recommendations for Other Services       Functional Status Assessment Patient has had a recent decline in their functional status and demonstrates the ability to make significant improvements in function in a reasonable and predictable amount of time.     Precautions / Restrictions Precautions Precautions: Fall      Mobility  Bed Mobility Overal bed mobility: Needs Assistance Bed Mobility: Rolling, Sidelying to Sit, Sit to Supine Rolling: Supervision Sidelying to sit: Mod assist   Sit to supine: Min assist   General bed mobility comments: Assist to bring legs off of bed, elevate trunk into sitting and bring hips to EOb. Assist to bring legs back up in  bed returning to supine.    Transfers Overall transfer level: Needs assistance Equipment used: 1 person hand held assist (and bed rail) Transfers: Sit to/from Stand Sit to Stand: Min assist           General transfer comment: Assist to bring hips up and for balance. Pt holding bed rail in rt hand with support from me at lt hip coming up. Pt stands totally on RLE with LLE off of ground and crossing over RLE at times    Ambulation/Gait                  Stairs            Wheelchair Mobility    Modified Rankin (Stroke Patients Only)       Balance Overall balance assessment: Needs assistance Sitting-balance support: Single extremity supported Sitting balance-Leahy Scale: Poor Sitting balance - Comments: UE support   Standing balance support: Single extremity supported Standing balance-Leahy Scale: Poor Standing balance comment: UE support and min assist for static standing                             Pertinent Vitals/Pain Pain Assessment Pain Assessment: Faces Faces Pain Scale: Hurts little more Pain Location: LUE with passive movement Pain Descriptors / Indicators: Grimacing Pain Intervention(s): Repositioned, Limited activity within patient's tolerance    Home Living Family/patient expects to be discharged to:: Skilled nursing facility                        Prior Function Prior Level of Function :  Needs assist       Physical Assist : Mobility (physical) Mobility (physical): Bed mobility;Transfers   Mobility Comments: Assist for bed mobility and transfers to chair       Hand Dominance   Dominant Hand: Left    Extremity/Trunk Assessment   Upper Extremity Assessment Upper Extremity Assessment: LUE deficits/detail LUE Deficits / Details: flexion contracture    Lower Extremity Assessment Lower Extremity Assessment: LLE deficits/detail LLE Deficits / Details: Can actively move some in flexion pattern. Flexor tone with  contracture of heelcord       Communication      Cognition Arousal/Alertness: Awake/alert Behavior During Therapy: WFL for tasks assessed/performed Overall Cognitive Status: No family/caregiver present to determine baseline cognitive functioning                                 General Comments: Baseline cognitive deficits from ICH        General Comments General comments (skin integrity, edema, etc.): VSS on RA    Exercises     Assessment/Plan    PT Assessment Patient needs continued PT services  PT Problem List Decreased strength;Decreased range of motion;Decreased balance;Decreased mobility;Impaired tone       PT Treatment Interventions DME instruction;Functional mobility training;Therapeutic activities;Therapeutic exercise;Balance training;Neuromuscular re-education;Patient/family education    PT Goals (Current goals can be found in the Care Plan section)  Acute Rehab PT Goals Patient Stated Goal: not stated PT Goal Formulation: With patient Time For Goal Achievement: 12/12/22 Potential to Achieve Goals: Good    Frequency Min 2X/week     Co-evaluation               AM-PAC PT "6 Clicks" Mobility  Outcome Measure Help needed turning from your back to your side while in a flat bed without using bedrails?: A Little Help needed moving from lying on your back to sitting on the side of a flat bed without using bedrails?: A Lot Help needed moving to and from a bed to a chair (including a wheelchair)?: A Lot Help needed standing up from a chair using your arms (e.g., wheelchair or bedside chair)?: A Lot Help needed to walk in hospital room?: Total Help needed climbing 3-5 steps with a railing? : Total 6 Click Score: 11    End of Session   Activity Tolerance: Patient tolerated treatment well Patient left: in bed;with call bell/phone within reach;with bed alarm set;with family/visitor present Nurse Communication: Mobility status PT Visit  Diagnosis: Other abnormalities of gait and mobility (R26.89);Hemiplegia and hemiparesis Hemiplegia - Right/Left: Left Hemiplegia - dominant/non-dominant: Dominant Hemiplegia - caused by: Other Nontraumatic intracranial hemorrhage    Time: 0940-7680 PT Time Calculation (min) (ACUTE ONLY): 29 min   Charges:   PT Evaluation $PT Eval Moderate Complexity: 1 Mod PT Treatments $Therapeutic Activity: 8-22 mins        [facility] PT Acute Rehabilitation Services Office [phone]   [name] [facility] 11/28/2022, 5:14 PM

## 2022-11-28 NOTE — Plan of Care (Signed)

## 2022-11-28 NOTE — Assessment & Plan Note (Signed)
Continue apixaban 

## 2022-11-28 NOTE — Hospital Course (Addendum)
Sarah Myers is a 51 y.o. 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  Sarah Myers unable to give any meaning full history but does respond to question and intermittently answers yes and no. Of note per family at baseline Sarah Myers is able to carry on a conversation.  Sarah Myers currently denies sob/ chest pain / n/v. Of note Sarah Myers also intermittently willing to conduct PE and history.

## 2022-11-28 NOTE — ED Notes (Signed)
Patient pulled out IV. While RN was cleaning her up noticed what appears to be a stitch on right forearm.

## 2022-11-28 NOTE — Assessment & Plan Note (Signed)
-   Continue amlodipine, coreg, hydralazine, losartan

## 2022-11-28 NOTE — Assessment & Plan Note (Signed)
Asymptomatic. 

## 2022-11-28 NOTE — Assessment & Plan Note (Signed)
-   Continue mirtazapine, sertraline

## 2022-11-29 DIAGNOSIS — F419 Anxiety disorder, unspecified: Secondary | ICD-10-CM

## 2022-11-29 DIAGNOSIS — G9341 Metabolic encephalopathy: Secondary | ICD-10-CM

## 2022-11-29 DIAGNOSIS — F32A Depression, unspecified: Secondary | ICD-10-CM

## 2022-11-29 DIAGNOSIS — N3000 Acute cystitis without hematuria: Secondary | ICD-10-CM

## 2022-11-29 DIAGNOSIS — U071 COVID-19: Secondary | ICD-10-CM | POA: Diagnosis not present

## 2022-11-29 LAB — CULTURE, OB URINE: Culture: 80000 — AB

## 2022-11-29 MED ORDER — CEPHALEXIN 500 MG PO CAPS
500.0000 mg | ORAL_CAPSULE | Freq: Two times a day (BID) | ORAL | Status: DC
  Administered 2022-11-29 – 2022-12-01 (×5): 500 mg via ORAL
  Filled 2022-11-29 (×5): qty 1

## 2022-11-29 MED ORDER — POTASSIUM CHLORIDE CRYS ER 20 MEQ PO TBCR
40.0000 meq | EXTENDED_RELEASE_TABLET | Freq: Once | ORAL | Status: AC
  Administered 2022-11-29: 40 meq via ORAL
  Filled 2022-11-29: qty 2

## 2022-11-29 NOTE — Progress Notes (Signed)
  Progress Note   Patient: Sarah Myers:[id number] DOB: January 08, 1971 DOA: 11/26/2022     2 DOS: the patient was seen and examined on 11/29/2022 at 8:18AM      Brief hospital course: Sarah Myers is a 51 y.o. 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     12/10: Admitted for COVID      Assessment and Plan: * COVID-19 Asymptomatic  Hypokalemia - Supplement K  Acute cystitis - Continue Rocephin  Acute metabolic encephalopathy Resolved  Anxiety and depression - Continue mirtazapine, sertraline  History of CVA (cerebrovascular accident) - Continue apixaban  Essential hypertension - Continue amlodipine, coreg, hydralazine, losartan  Bilateral pulmonary embolism (HCC) - Continue Eliquis          Subjective: Feels well.  Feels like she is "getting over it".  No fever, no confusion, no dyspnea, no urinary symptoms.     Physical Exam: BP 133/65 (BP Location: Right Arm)   Pulse (!) 57   Temp 98.7 F (37.1 C) (Oral)   Resp 14   Ht 5\' 3"  (1.6 m)   Wt 47.8 kg   SpO2 97%   BMI 18.67 kg/m   Adult female, lying in bed, left hemiplegia noted RRR no mrumurs, no LE edema Respiratory rate normal, lungs clear without rales or wheezes  Data Reviewed: Potassium 3.4 Calcium slightly high  Family Communication: Non present    Disposition: Status is: Inpatient Remains medically tsable for discharge        Author: , MD 11/29/2022 4:21 PM  For on call review [email or web address].

## 2022-11-29 NOTE — TOC Progression Note (Addendum)
Transition of Care [facility]) - Progression Note    Patient Details  Name: Sarah Myers MRN: [id number] Date of Birth: 1971/11/27  [facility]) CM/SW Contact  Mearl Latin, LCSW Phone Number: 11/29/2022, 10:09 AM  Clinical Narrative:    10am-[facility] stated patient is out of [employer] days and they are now pursuing auth through her [facility].   5pm-Still awaiting authorization.    Expected Discharge Plan: Skilled Nursing Facility Barriers to Discharge: Insurance Authorization  Expected Discharge Plan and Services Expected Discharge Plan: Skilled Nursing Facility In-house Referral: Clinical Social Work   Post Acute Care Choice: Skilled Nursing Facility Living arrangements for the past 2 months: Skilled Nursing Facility                                       Social Determinants of Health (SDOH) Interventions    Readmission Risk Interventions    07/05/2022   11:29 AM 06/30/2022   12:52 PM 03/09/2022    9:54 AM  Readmission Risk Prevention Plan  Transportation Screening Complete Complete Complete  PCP or Specialist Appt within 5-7 Days  Complete   PCP or Specialist Appt within 3-5 Days Complete  Complete  Home Care Screening  Complete   Medication Review (RN CM)  Complete   HRI or Home Care Consult Complete  Not Complete  Social Work Consult for Recovery Care Planning/Counseling Complete  Complete  Palliative Care Screening Complete    Medication Review Oceanographer) Complete  Complete

## 2022-11-29 NOTE — Plan of Care (Signed)

## 2022-11-30 NOTE — Progress Notes (Signed)
  Progress Note   Patient: Sarah Myers MGN:[id number] DOB: [birth year] DOA: 11/26/2022     3 DOS: the patient was seen and examined on 11/30/2022        Brief hospital course: Sarah Myers is a 51 y.o. 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     12/10: Admitted for COVID      Assessment and Plan: * COVID-19 Asymptomatic  Hypokalemia - Supplement K  Acute cystitis - Continue Rocephin  Acute metabolic encephalopathy Resolved  Anxiety and depression - Continue mirtazapine, sertraline  History of CVA (cerebrovascular accident) - Continue apixaban  Essential hypertension - Continue amlodipine, coreg, hydralazine, losartan  Bilateral pulmonary embolism (HCC) - Continue Eliquis          Subjective: No change no nursing concerns      Physical Exam: BP 117/80 (BP Location: Right Arm)   Pulse 67   Temp 98.2 F (36.8 C) (Oral)   Resp 17   Ht 5\' 3"  (1.6 m)   Wt 47.4 kg   SpO2 98%   BMI 18.51 kg/m   Adult female, lying in bed, left hemiplegia RRR, no murmurs, no lower extremity edema Respiratory rate normal, lungs clear without rales or wheezes  Data Reviewed: No new labs       Disposition: Status is: Inpatient Remains medically stable  for discharge        Author: , MD 11/30/2022 4:32 PM  For on call review [email or web address].

## 2022-11-30 NOTE — TOC Progression Note (Addendum)
Transition of Care [facility]) - Progression Note    Patient Details  Name: [name] MRN: [id number] Date of Birth: 05-Nov-1971  Transition of Care [facility]) CM/SW Contact  Mearl Latin, LCSW Phone Number: 11/30/2022, 9:40 AM  Clinical Narrative:    9:40am-Awaiting insurance approval from [facility].  CSW contacted [facility] Medicaid to check status of auth. They requested CSW fax clinicals as they have not received anything from [facility]. CSW did so (f. [id number]).    Expected Discharge Plan: Skilled Nursing Facility Barriers to Discharge: Insurance Authorization  Expected Discharge Plan and Services Expected Discharge Plan: Skilled Nursing Facility In-house Referral: Clinical Social Work   Post Acute Care Choice: Skilled Nursing Facility Living arrangements for the past 2 months: Skilled Nursing Facility                                       Social Determinants of Health (SDOH) Interventions    Readmission Risk Interventions    07/05/2022   11:29 AM 06/30/2022   12:52 PM 03/09/2022    9:54 AM  Readmission Risk Prevention Plan  Transportation Screening Complete Complete Complete  PCP or Specialist Appt within 5-7 Days  Complete   PCP or Specialist Appt within 3-5 Days Complete  Complete  Home Care Screening  Complete   Medication Review (RN CM)  Complete   HRI or Home Care Consult Complete  Not Complete  Social Work Consult for Recovery Care Planning/Counseling Complete  Complete  Palliative Care Screening Complete    Medication Review Oceanographer) Complete  Complete

## 2022-12-01 DIAGNOSIS — U071 COVID-19: Secondary | ICD-10-CM | POA: Diagnosis not present

## 2022-12-01 MED ORDER — CEPHALEXIN 500 MG PO CAPS: 500.0000 mg | ORAL_CAPSULE | Freq: Two times a day (BID) | ORAL | 0 refills | Status: DC

## 2022-12-01 NOTE — Progress Notes (Signed)
Physical Therapy Treatment Patient Details Name: Sarah Myers MRN: [id number] DOB: 05/17/1971 Today's Date: 12/01/2022   History of Present Illness Pt is 51 year old presented to [facility] on  11/27/22 with AMS and fall at facility. Pt found to be +Covid. PMH - ICH with residual lt weakness, PE, HTN, anxiety.    PT Comments    Pt with improving mobility. Did very well with Stedy. Continue to recommend return to SNF.    Recommendations for follow up therapy are one component of a multi-disciplinary discharge planning process, led by the attending physician.  Recommendations may be updated based on patient status, additional functional criteria and insurance authorization.  Follow Up Recommendations  Skilled nursing-short term rehab (<3 hours/day) Can patient physically be transported by private vehicle: No   Assistance Recommended at Discharge Frequent or constant Supervision/Assistance  Patient can return home with the following A lot of help with walking and/or transfers;A lot of help with bathing/dressing/bathroom   Equipment Recommendations  None recommended by PT    Recommendations for Other Services       Precautions / Restrictions Precautions Precautions: Fall     Mobility  Bed Mobility Overal bed mobility: Needs Assistance Bed Mobility: Sidelying to Sit, Rolling Rolling: Supervision Sidelying to sit: Supervision, HOB elevated       General bed mobility comments: Incr time and use of rail but no physical assistance    Transfers Overall transfer level: Needs assistance Equipment used: Ambulation equipment used Transfers: Sit to/from Stand, Bed to chair/wheelchair/BSC Sit to Stand: Min assist           General transfer comment: Assist for balance. Pt stands on RLE with LLE off of the ground at times. Used [employer] for bed to Materials engineer: Naval architect [place] (Stroke Patients Only)       Balance Overall balance assessment: Needs assistance Sitting-balance support: No upper extremity supported, Feet unsupported Sitting balance-Leahy Scale: Fair     Standing balance support: Single extremity supported Standing balance-Leahy Scale: Poor Standing balance comment: UE support and min guard assist for static standing                            Cognition Arousal/Alertness: Awake/alert Behavior During Therapy: WFL for tasks assessed/performed Overall Cognitive Status: No family/caregiver present to determine baseline cognitive functioning                                 General Comments: Baseline cognitive deficits from ICH        Exercises      General Comments        Pertinent Vitals/Pain Pain Assessment Pain Assessment: No/denies pain    Home Living                          Prior Function            PT Goals (current goals can now be found in the care plan section) Acute Rehab PT Goals Patient Stated Goal: not stated Progress towards PT goals: Progressing toward goals    Frequency    Min 2X/week  PT Plan Current plan remains appropriate    Co-evaluation              AM-PAC PT "6 Clicks" Mobility   Outcome Measure  Help needed turning from your back to your side while in a flat bed without using bedrails?: A Little Help needed moving from lying on your back to sitting on the side of a flat bed without using bedrails?: A Little Help needed moving to and from a bed to a chair (including a wheelchair)?: A Lot Help needed standing up from a chair using your arms (e.g., wheelchair or bedside chair)?: A Little Help needed to walk in hospital room?: Total Help needed climbing 3-5 steps with a railing? : Total 6 Click Score: 13    End of Session   Activity Tolerance: Patient tolerated treatment well Patient left: with call bell/phone within reach;in  chair;with chair alarm set Nurse Communication: Mobility status;Need for lift equipment PT Visit Diagnosis: Other abnormalities of gait and mobility (R26.89);Hemiplegia and hemiparesis Hemiplegia - Right/Left: Left Hemiplegia - dominant/non-dominant: Dominant Hemiplegia - caused by: Other Nontraumatic intracranial hemorrhage     Time: 3419-6222 PT Time Calculation (min) (ACUTE ONLY): 20 min  Charges:  $Therapeutic Activity: 8-22 mins                     [facility] PT Acute Rehabilitation Services Office [phone]    [name] 12/01/2022, 9:52 AM

## 2022-12-01 NOTE — Consult Note (Signed)
   [facility] CM Inpatient Consult   12/01/2022  Sarah Myers 02-Aug-1971 [id number]  Managed Medicaid:  [facility] remote coverage review for patient on [place] isloation  Primary Care Provider:  Rosaria Ferries, MD   Patient screened for hospitalization with noted hgh risk score for unplanned readmission risk and noted high risk fall.  Reviewed to assess for potential follow up with Managed Medicaid team for community follow up and  service needs for post hospital transition for care coordination.  Review of patient's electronic medical record reveals patient is transitioning to a skilled nursing facility at [facility].   Plan:  Referral request for community care coordination:  No referral at this time, as current post transition needs are to be met at a skilled nursing facility level of care.  Of note, [facility] Care Management/Population Health does not replace or interfere with any arrangements made by the Inpatient Transition of Care team.  Will sign off.  For questions contact:   Sarah Brood, RN BSN [place]  [phone] business mobile phone Toll free office [phone]  *[place]  [phone] Fax number: [phone] [email or web address]

## 2022-12-01 NOTE — Progress Notes (Signed)
Called report to Event organiser at [facility]. No further questions at this time. [facility] arranged for transport back to facility. Paperwork in packet to be delivered with patient at facility.

## 2022-12-01 NOTE — Progress Notes (Signed)
Patient taken by Sharin Mons, dayshift RN Robin discharged patient

## 2022-12-01 NOTE — TOC Transition Note (Signed)
Transition of Care [facility]) - CM/SW Discharge Note   Patient Details  Name: Sarah Myers MRN: [id number] Date of Birth: [birth year]  Transition [facility]) CM/SW Contact:  Mearl Latin, LCSW Phone Number: 12/01/2022, 1:51 PM   Clinical Narrative:    Patient will DC to: [facility] Anticipated DC date: 12/01/22 Family notified: Daughter, Scientist, research (medical) by: Sharin Mons   Per MD patient ready for DC to [facility]. RN to call report prior to discharge [phone]). RN, patient, patient's family, and facility notified of DC. Discharge Summary and FL2 sent to facility. DC packet on chart. Ambulance transport requested for patient.   CSW will sign off for now as social work intervention is no longer needed. Please consult [place] again if new needs arise.     Final next level of care: Skilled Nursing Facility Barriers to Discharge: Barriers Resolved   Patient Goals and CMS Choice Patient states their goals for this hospitalization and ongoing recovery are:: Return to SNF CMS Medicare.gov Compare Post Acute Care list provided to:: Patient Represenative (must comment) Choice offered to / list presented to : Adult Children  Discharge Placement   Existing PASRR number confirmed : 12/01/22          Patient chooses bed at:  [facility]) Patient to be transferred to facility by: PTAR Name of family member notified: Daughter, Sarah Myers Patient and family notified of of transfer: 12/01/22  Discharge Plan and Services In-house Referral: Clinical Social Work   Post Acute Care Choice: Skilled Nursing Facility                               Social Determinants of Health (SDOH) Interventions     Readmission Risk Interventions    07/05/2022   11:29 AM 06/30/2022   12:52 PM 03/09/2022    9:54 AM  Readmission Risk Prevention Plan  Transportation Screening Complete Complete Complete  PCP or Specialist Appt within 5-7 Days  Complete   PCP or Specialist Appt within 3-5  Days Complete  Complete  Home Care Screening  Complete   Medication Review (RN CM)  Complete   HRI or Home Care Consult Complete  Not Complete  Social Work Consult for Recovery Care Planning/Counseling Complete  Complete  Palliative Care Screening Complete    Medication Review Oceanographer) Complete  Complete

## 2022-12-01 NOTE — TOC Progression Note (Addendum)
Transition of Care [facility]) - Progression Note    Patient Details  Name: Sarah Myers MRN: [id number] Date of Birth: 1971-10-15  Transition of Care [facility]) CM/SW Contact  Mearl Latin, LCSW Phone Number: 12/01/2022, 11:59 AM  Clinical Narrative:    CSW received call from [facility] that insurance approval has been given for today only. CSW also received fax from [facility] confirming that, Authorization # [id number]. CSW updated patient's daughter and she is in agreement with [facility] for transport.    Expected Discharge Plan: Skilled Nursing Facility Barriers to Discharge: Insurance Authorization  Expected Discharge Plan and Services Expected Discharge Plan: Skilled Nursing Facility In-house Referral: Clinical Social Work   Post Acute Care Choice: Skilled Nursing Facility Living arrangements for the past 2 months: Skilled Nursing Facility                                       Social Determinants of Health (SDOH) Interventions    Readmission Risk Interventions    07/05/2022   11:29 AM 06/30/2022   12:52 PM 03/09/2022    9:54 AM  Readmission Risk Prevention Plan  Transportation Screening Complete Complete Complete  PCP or Specialist Appt within 5-7 Days  Complete   PCP or Specialist Appt within 3-5 Days Complete  Complete  Home Care Screening  Complete   Medication Review (RN CM)  Complete   HRI or Home Care Consult Complete  Not Complete  Social Work Consult for Recovery Care Planning/Counseling Complete  Complete  Palliative Care Screening Complete    Medication Review Oceanographer) Complete  Complete

## 2022-12-01 NOTE — Discharge Summary (Signed)
Physician Discharge Summary   Patient: Sarah Myers MRN: [id number] DOB: 1971/11/21  Admit date:     11/26/2022  Discharge date: 12/01/22  Discharge Physician: Alberteen Sam   PCP: Shayne Alken, MD     Recommendations at discharge:  Take cephalexin twice daily for 2 more days for UTI then stop     Discharge Diagnoses: Principal Problem:   COVID-19 Active Problems:   Bilateral pulmonary embolism (HCC)   Essential hypertension   History of CVA (cerebrovascular accident)   Anxiety and depression   Acute metabolic encephalopathy, ruled out   Acute cystitis   Hypokalemia      Hospital Course: Sarah Myers is a 51 y.o. 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         * COVID-19 Asymptomatic, no treatment needed.  Isolation until 5 days from positive testing.   Acute cystitis Urinalysis with pyuria.  Treated with Rocephin and cephalexin.  Pan-sensitive E coli in urine culture.  Acute metabolic encephalopathy, ruled out In the hospital, found to be at baseline.           [facility] Controlled Substances Registry was reviewed for this patient prior to discharge.   Consultants: None Procedures performed: None  Disposition: Long term care Diet recommendation:  No change  DISCHARGE MEDICATION: Allergies as of 12/01/2022       Reactions   Lisinopril Cough        Medication List     STOP taking these medications    oxyCODONE-acetaminophen 5-325 MG tablet Commonly known as: PERCOCET/ROXICET   tiZANidine 4 MG tablet Commonly known as: Zanaflex       TAKE these medications    acetaminophen 500 MG tablet Commonly known as: TYLENOL Take 1,000 mg by mouth every 6 (six) hours as needed for moderate pain.   ADULT NUTRITIONAL SUPPLEMENT PO Take 1 Dose  by mouth 4 (four) times daily. [facility] shake   Amantadine HCl 100 MG tablet Take 100 mg by mouth 2 (two) times daily.   amLODipine 10 MG tablet Commonly known as: NORVASC Take 1 tablet (10 mg total) by mouth daily.   apixaban 5 MG Tabs tablet Commonly known as: ELIQUIS Take 1 tablet (5 mg total) by mouth 2 (two) times daily.   ascorbic acid 500 MG tablet Commonly known as: VITAMIN C Take 500 mg by mouth 2 (two) times daily.   b complex vitamins capsule Take 1 capsule by mouth daily. For Covid   baclofen 10 MG tablet Commonly known as: LIORESAL Take 15 mg by mouth 2 (two) times daily.   busPIRone 5 MG tablet Commonly known as: BUSPAR Take 1 tablet (5 mg total) by mouth 3 (three) times daily as needed.   carvedilol 6.25 MG tablet Commonly known as: COREG Take 1 tablet (6.25 mg total) by mouth 2 (two) times daily with a meal.   cephALEXin 500 MG capsule Commonly known as: KEFLEX Take 1 capsule (500 mg total) by mouth every 12 (twelve) hours.   cholecalciferol 25 MCG (1000 UNIT) tablet Commonly known as: VITAMIN D3 Take 1,000 Units by mouth daily.   famotidine 20 MG tablet Commonly known as: PEPCID Take 1 tablet (20 mg total) by mouth 2 (two) times daily.   gabapentin 100 MG capsule Commonly known as: NEURONTIN Take 1 capsule (100 mg total) by mouth 2 (  two) times daily.   hydrALAZINE 25 MG tablet Commonly known as: APRESOLINE Take 25 mg by mouth every 6 (six) hours.   hydrOXYzine 10 MG tablet Commonly known as: ATARAX Take 1 tablet (10 mg total) by mouth 3 (three) times daily as needed.   losartan 25 MG tablet Commonly known as: COZAAR Take 1 tablet (25 mg total) by mouth daily.   magnesium gluconate 500 MG tablet Commonly known as: MAGONATE Take 1 tablet (500 mg total) by mouth at bedtime.   mirtazapine 7.5 MG tablet Commonly known as: REMERON Take 7.5 mg by mouth at bedtime.   multivitamin with minerals Tabs tablet Take 1 tablet by mouth  daily.   ondansetron 4 MG tablet Commonly known as: ZOFRAN Take 1 tablet (4 mg total) by mouth every 8 (eight) hours as needed for nausea or vomiting.   Paxlovid (300/100) 20 x 150 MG & 10 x 100MG  Tbpk Generic drug: nirmatrelvir & ritonavir Take 1 tablet by mouth 2 (two) times daily. For 5 days   sertraline 50 MG tablet Commonly known as: ZOLOFT Take 1 tablet (50 mg total) by mouth at bedtime.   ZINC PO Take 1 tablet by mouth daily.        Contact information for after-discharge care     Destination     [facility] .   Service: Skilled Nursing Contact information: [address] [place] [phone]                     Discharge Instructions     Increase activity slowly   Complete by: As directed        Discharge Exam: Filed Weights   11/30/22 0500 12/01/22 0500 12/01/22 0505  Weight: 47.4 kg 47.6 kg 49.2 kg    General: Pt is alert, awake, not in acute distress, sitting in recliner, appears chronically ill Cardiovascular: RRR, nl S1-S2, no murmurs appreciated.   No LE edema.   Respiratory: Normal respiratory rate and rhythm.  CTAB without rales or wheezes. Abdominal: Abdomen soft and non-tender.  No distension or HSM.   Neuro/Psych: Chronic left hemiplegia.  Judgment and insight appear at baseline, somewhat impaired.   Condition at discharge: stable  The results of significant diagnostics from this hospitalization (including imaging, microbiology, ancillary and laboratory) are listed below for reference.   Imaging Studies: CT HEAD WO CONTRAST  Result Date: 11/26/2022 CLINICAL DATA:  Polytrauma. EXAM: CT HEAD WITHOUT CONTRAST CT CERVICAL SPINE WITHOUT CONTRAST TECHNIQUE: Multidetector CT imaging of the head and cervical spine was performed following the standard protocol without intravenous contrast. Multiplanar CT image reconstructions of the cervical spine were also generated. RADIATION DOSE REDUCTION:  This exam was performed according to the departmental dose-optimization program which includes automated exposure control, adjustment of the mA and/or kV according to patient size and/or use of iterative reconstruction technique. COMPARISON:  None Available. 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 No evidence of hemorrhage. No hydrocephalus. No extra-axial fluid collection. Likely findings of transneural degeneration in the right cereberal peduncle. Vascular: No hyperdense vessel or unexpected calcification. Skull: Normal. Negative for fracture or focal lesion. Sinuses/Orbits: No acute finding. Unchanged appearance of the bilateral ethmoid sinuses with a somewhat expansile appearance. Other: Odontogenic disease with a periapical lucency along a right maxillary molar CT CERVICAL SPINE FINDINGS Alignment: Straightening of the normal cervical lordosis. Skull  base and vertebrae: No acute fracture. No primary bone lesion or focal pathologic process. Soft tissues and spinal canal: No prevertebral fluid or swelling. No visible canal hematoma. Disc levels:  No evidence of high-grade spinal canal stenosis. Upper chest: Negative. Other: None IMPRESSION: 1. No acute intracranial abnormality. 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 2. No acute cervical spine fracture or traumatic listhesis. Electronically Signed   By: Lorenza Cambridge M.D.   On: 11/26/2022 13:44   CT CERVICAL SPINE WO CONTRAST  Result Date: 11/26/2022 CLINICAL DATA:  Polytrauma. EXAM: CT HEAD WITHOUT CONTRAST CT CERVICAL SPINE WITHOUT CONTRAST TECHNIQUE: Multidetector CT imaging of the head and cervical spine was performed following the standard protocol without intravenous contrast.  Multiplanar CT image reconstructions of the cervical spine were also generated. RADIATION DOSE REDUCTION: This exam was performed according to the departmental dose-optimization program which includes automated exposure control, adjustment of the mA and/or kV according to patient size and/or use of iterative reconstruction technique. COMPARISON:  None Available. 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 No evidence of hemorrhage. No hydrocephalus. No extra-axial fluid collection. Likely findings of transneural degeneration in the right cereberal peduncle. Vascular: No hyperdense vessel or unexpected calcification. Skull: Normal. Negative for fracture or focal lesion. Sinuses/Orbits: No acute finding. Unchanged appearance of the bilateral ethmoid sinuses with a somewhat expansile appearance. Other: Odontogenic disease with a periapical lucency along a right maxillary molar CT CERVICAL SPINE FINDINGS Alignment: Straightening of the normal cervical lordosis. Skull base and vertebrae: No acute fracture. No primary bone lesion or focal pathologic process. Soft tissues and spinal canal: No prevertebral fluid or swelling. No visible canal hematoma. Disc levels:  No evidence of high-grade spinal canal stenosis. Upper chest: Negative. Other: None IMPRESSION: 1. No acute intracranial abnormality. 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 2. No acute cervical spine fracture or traumatic listhesis. Electronically Signed   By: Lorenza Cambridge M.D.   On: 11/26/2022 13:44   DG Pelvis Portable  Result Date: 11/26/2022 CLINICAL DATA:  Fall. EXAM: PORTABLE PELVIS 1-2 VIEWS COMPARISON:  03/22/2022 FINDINGS: Minimal symmetric degenerative changes of the hips. No evidence  of acute fracture or dislocation. Remainder the exam is unchanged. IMPRESSION: 1. No acute findings. 2. Minimal symmetric degenerative changes of the hips. Electronically Signed   By: Elberta Fortis M.D.   On: 11/26/2022 12:39   DG Chest Port 1 View  Result Date: 11/26/2022 CLINICAL DATA:  Fall. EXAM: PORTABLE CHEST 1 VIEW COMPARISON:  10/18/2022 FINDINGS: Lungs are adequately inflated without focal airspace consolidation, effusion or pneumothorax. Cardiomediastinal silhouette is normal. Bones and soft tissues are normal. IMPRESSION: No active disease. Electronically Signed   By: Elberta Fortis M.D.   On: 11/26/2022 12:37    Microbiology: Results for orders placed or performed during the hospital encounter of 11/26/22  SARS Coronavirus 2 by RT PCR (hospital order, performed in [facility] hospital lab) *cepheid single result test* Anterior Nasal Swab     Status: Abnormal   Collection Time: 11/26/22  7:40 PM   Specimen: Anterior Nasal Swab  Result Value Ref Range Status   SARS Coronavirus 2 by RT PCR POSITIVE (A) NEGATIVE Final    Comment: (NOTE) SARS-CoV-2 target nucleic  acids are DETECTED  SARS-CoV-2 RNA is generally detectable in upper respiratory specimens  during the acute phase of infection.  Positive results are indicative  of the presence of the identified virus, but do not rule out bacterial infection or co-infection with other pathogens not detected by the test.  Clinical correlation with patient history and  other diagnostic information is necessary to determine patient infection status.  The expected result is negative.  Fact Sheet for Patients:   RoadLapTop.co.za   Fact Sheet for Healthcare Providers:   http://kim-miller.com/    This test is not yet approved or cleared by the [place] FDA and  has been authorized for detection and/or diagnosis of SARS-CoV-2 by FDA under an Emergency Use Authorization (EUA).  This EUA  will remain in effect (meaning this test can be used) for the duration of  the COVID-19 declaration under Section 564(b)(1)  of the Act, 21 U.S.C. section 360-bbb-3(b)(1), unless the authorization is terminated or revoked sooner.   Performed at [facility], [address] [place], [place] [address]   Culture, [place] Urine     Status: Abnormal   Collection Time: 11/27/22 11:08 AM   Specimen: Urine, Random  Result Value Ref Range Status   Specimen Description URINE, RANDOM  Final   Special Requests NONE  Final   Culture (A)  Final    80,000 COLONIES/mL ESCHERICHIA COLI NO GROUP B STREP (S.AGALACTIAE) ISOLATED Performed at [facility], [address] [place], [place] [address]    Report Status 11/29/2022 FINAL  Final   Organism ID, Bacteria ESCHERICHIA COLI (A)  Final      Susceptibility   Escherichia coli - MIC*    AMPICILLIN 16 INTERMEDIATE Intermediate     CEFAZOLIN <=4 SENSITIVE Sensitive     CEFEPIME <=0.12 SENSITIVE Sensitive     CEFTRIAXONE <=0.25 SENSITIVE Sensitive     CIPROFLOXACIN <=0.25 SENSITIVE Sensitive     GENTAMICIN <=1 SENSITIVE Sensitive     IMIPENEM <=0.25 SENSITIVE Sensitive     NITROFURANTOIN 32 SENSITIVE Sensitive     TRIMETH/SULFA <=20 SENSITIVE Sensitive     AMPICILLIN/SULBACTAM 4 SENSITIVE Sensitive     PIP/TAZO <=4 SENSITIVE Sensitive     * 80,000 COLONIES/mL ESCHERICHIA COLI    Labs: CBC: Recent Labs  Lab 11/26/22 1235 11/26/22 1247 11/27/22 0530  WBC 7.6  --  5.4  HGB 14.0 14.6 12.5  HCT 42.4 43.0 39.3  MCV 83.8  --  85.2  PLT 193  --  166   Basic Metabolic Panel: Recent Labs  Lab 11/26/22 1235 11/26/22 1247 11/27/22 0530 11/28/22 0829  NA 142 143 142 139  K 3.9 3.8 3.4* 3.4*  CL 108 109 109 106  CO2 23  --  22 23  GLUCOSE 153* 154* 79 115*  BUN CREATININE 0.96 0.90 0.87 0.75  CALCIUM 10.7*  --  10.6* 10.7*  MG 2.0  --   --   --    Liver Function Tests: Recent Labs  Lab 11/26/22 1235  11/27/22 0530  AST 38 29  ALT 34 27  ALKPHOS 84 76  BILITOT 0.4 0.6  PROT 8.1 7.4  ALBUMIN 4.1 3.7   CBG: No results for input(s): "GLUCAP" in the last 168 hours.  Discharge time spent: approximately 25 minutes spent on discharge counseling, evaluation of patient on day of discharge, and coordination of discharge planning with nursing, social work, pharmacy and case management  Signed: Alberteen Sam,  MD [facility] 12/01/2022

## 2022-12-11 ENCOUNTER — Emergency Department (HOSPITAL_COMMUNITY): Payer: Commercial Managed Care - HMO

## 2022-12-11 ENCOUNTER — Emergency Department (HOSPITAL_COMMUNITY)
Admission: EM | Admit: 2022-12-11 | Discharge: 2022-12-12 | Disposition: A | Discharge status: Home / Self Care | Payer: Commercial Managed Care - HMO | Attending: Emergency Medicine | Admitting: Emergency Medicine

## 2022-12-11 DIAGNOSIS — S0990XA Unspecified injury of head, initial encounter: Secondary | ICD-10-CM

## 2022-12-11 DIAGNOSIS — R609 Edema, unspecified: Secondary | ICD-10-CM | POA: Diagnosis not present

## 2022-12-11 DIAGNOSIS — R519 Headache, unspecified: Secondary | ICD-10-CM | POA: Diagnosis not present

## 2022-12-11 DIAGNOSIS — S0083XA Contusion of other part of head, initial encounter: Secondary | ICD-10-CM | POA: Diagnosis not present

## 2022-12-11 DIAGNOSIS — I10 Essential (primary) hypertension: Secondary | ICD-10-CM | POA: Insufficient documentation

## 2022-12-11 DIAGNOSIS — W06XXXA Fall from bed, initial encounter: Secondary | ICD-10-CM | POA: Diagnosis not present

## 2022-12-11 DIAGNOSIS — W19XXXA Unspecified fall, initial encounter: Secondary | ICD-10-CM

## 2022-12-11 DIAGNOSIS — R441 Visual hallucinations: Secondary | ICD-10-CM | POA: Insufficient documentation

## 2022-12-11 DIAGNOSIS — R457 State of emotional shock and stress, unspecified: Secondary | ICD-10-CM | POA: Diagnosis not present

## 2022-12-11 DIAGNOSIS — Z8673 Personal history of transient ischemic attack (TIA), and cerebral infarction without residual deficits: Secondary | ICD-10-CM | POA: Diagnosis not present

## 2022-12-11 DIAGNOSIS — Z7901 Long term (current) use of anticoagulants: Secondary | ICD-10-CM | POA: Diagnosis not present

## 2022-12-11 LAB — CBC
HCT: 34.5 % — ABNORMAL LOW (ref 36.0–46.0)
Hemoglobin: 10.9 g/dL — ABNORMAL LOW (ref 12.0–15.0)
MCH: 27.1 pg (ref 26.0–34.0)
MCHC: 31.6 g/dL (ref 30.0–36.0)
MCV: 85.8 fL (ref 80.0–100.0)
Platelets: 241 10*3/uL (ref 150–400)
RBC: 4.02 MIL/uL (ref 3.87–5.11)
RDW: 13.8 % (ref 11.5–15.5)
WBC: 6.5 10*3/uL (ref 4.0–10.5)
nRBC: 0 % (ref 0.0–0.2)

## 2022-12-11 LAB — PROTIME-INR
INR: 1.2 (ref 0.8–1.2)
Prothrombin Time: 15.3 seconds — ABNORMAL HIGH (ref 11.4–15.2)

## 2022-12-11 MED ORDER — AMLODIPINE BESYLATE 5 MG PO TABS
10.0000 mg | ORAL_TABLET | Freq: Every day | ORAL | Status: DC
  Administered 2022-12-12: 10 mg via ORAL
  Filled 2022-12-11: qty 2

## 2022-12-11 MED ORDER — APIXABAN 5 MG PO TABS
5.0000 mg | ORAL_TABLET | Freq: Two times a day (BID) | ORAL | Status: DC
  Administered 2022-12-12: 5 mg via ORAL
  Filled 2022-12-11: qty 1

## 2022-12-11 MED ORDER — LORAZEPAM 2 MG/ML IJ SOLN
1.0000 mg | Freq: Once | INTRAMUSCULAR | Status: AC
  Administered 2022-12-11: 1 mg via INTRAMUSCULAR
  Filled 2022-12-11: qty 1

## 2022-12-11 MED ORDER — HALOPERIDOL LACTATE 5 MG/ML IJ SOLN
5.0000 mg | Freq: Once | INTRAMUSCULAR | Status: AC
  Administered 2022-12-11: 5 mg via INTRAMUSCULAR
  Filled 2022-12-11: qty 1

## 2022-12-11 MED ORDER — SERTRALINE HCL 50 MG PO TABS
50.0000 mg | ORAL_TABLET | Freq: Every day | ORAL | Status: DC
  Administered 2022-12-12: 50 mg via ORAL
  Filled 2022-12-11: qty 1

## 2022-12-11 MED ORDER — CARVEDILOL 3.125 MG PO TABS
6.2500 mg | ORAL_TABLET | Freq: Two times a day (BID) | ORAL | Status: DC
  Administered 2022-12-12: 6.25 mg via ORAL
  Filled 2022-12-11: qty 2

## 2022-12-11 NOTE — ED Triage Notes (Signed)
Patient BIB [facility] from [facility] after fall on thinners, did hit head, no LOC, A&Ox4-ish. Patient answers questions appropriately but did not believe EMS was real. Patient unable to tolerate transport without family due to anxiety. Patient also refused VS en route, except for pulse-ox.

## 2022-12-11 NOTE — ED Provider Notes (Signed)
[facility] EMERGENCY DEPARTMENT Provider Note   CSN: [id number] Arrival date & time: 12/11/22  1815     History  Chief Complaint  Patient presents with   Sarah Myers is a 51 y.o. female.  51 year old female with past medical history significant for CVA, left-sided deficits, baseline cognitive delay who presents today from [facility] nursing home for evaluation following fall.  She is on chronic anticoagulation with Eliquis.  Patient states she was adjusting herself on the bed when she fell off to the side of the bed striking her head.  She has a hematoma to the right side of her forehead.  Complains of neck pain, and generalized body soreness.  Denies nausea, vomiting.  Her daughter is at bedside with her.  Patient states that she is not very trusting at this time and would like her daughter at bedside throughout her entire visit.  According to patient's daughter since patient's CVA she has had moments where she becomes delusional and paranoid.  She currently is protruding paranoia and has visual hallucinations.  She believes that someone else is in the room besides myself and her daughter.  She is requesting that person be removed.  She believes her privacy has been violated.  She is not redirectable. patient also endorses some chest tightness that has been ongoing since this morning.  This started prior to the fall.  Patient also states that this morning she did not take her medications.  She states after these were given to her she spit them out.  Last time she took her home medications were last night.  Attempted to get hold of nursing home twice without success.  The history is provided by the patient. No language interpreter was used.       Home Medications Prior to Admission medications   Medication Sig Start Date End Date Taking? Authorizing Provider  acetaminophen (TYLENOL) 500 MG tablet Take 1,000 mg by mouth every 6 (six) hours as needed  for moderate pain.    [provider]  Amantadine HCl 100 MG tablet Take 100 mg by mouth 2 (two) times daily. 09/15/22   [provider]  amLODipine (NORVASC) 10 MG tablet Take 1 tablet (10 mg total) by mouth daily. 06/22/22   Raulkar, Drema Pry, MD  apixaban (ELIQUIS) 5 MG TABS tablet Take 1 tablet (5 mg total) by mouth 2 (two) times daily. 06/22/22   Raulkar, Drema Pry, MD  ascorbic acid (VITAMIN C) 500 MG tablet Take 500 mg by mouth 2 (two) times daily.    [provider]  b complex vitamins capsule Take 1 capsule by mouth daily. For Covid    [provider]  baclofen (LIORESAL) 10 MG tablet Take 15 mg by mouth 2 (two) times daily. 06/13/22   [provider]  busPIRone (BUSPAR) 5 MG tablet Take 1 tablet (5 mg total) by mouth 3 (three) times daily as needed. Patient not taking: Reported on 11/26/2022 06/23/22   Horton Chin, MD  carvedilol (COREG) 6.25 MG tablet Take 1 tablet (6.25 mg total) by mouth 2 (two) times daily with a meal. 06/22/22   Raulkar, Drema Pry, MD  cephALEXin (KEFLEX) 500 MG capsule Take 1 capsule (500 mg total) by mouth every 12 (twelve) hours. 12/01/22   Danford, Earl Lites, MD  cholecalciferol (VITAMIN D3) 25 MCG (1000 UNIT) tablet Take 1,000 Units by mouth daily.    [provider]  famotidine (PEPCID) 20 MG tablet Take  1 tablet (20 mg total) by mouth 2 (two) times daily. 05/22/22   Raulkar, Drema Pry, MD  gabapentin (NEURONTIN) 100 MG capsule Take 1 capsule (100 mg total) by mouth 2 (two) times daily. 05/24/22   Raulkar, Drema Pry, MD  hydrALAZINE (APRESOLINE) 25 MG tablet Take 25 mg by mouth every 6 (six) hours. 10/10/22   [provider]  hydrOXYzine (ATARAX) 10 MG tablet Take 1 tablet (10 mg total) by mouth 3 (three) times daily as needed. Patient not taking: Reported on 11/26/2022 06/09/22   Horton Chin, MD  losartan (COZAAR) 25 MG tablet Take 1 tablet (25 mg total) by mouth daily. 06/09/22   Raulkar,  Drema Pry, MD  magnesium gluconate (MAGONATE) 500 MG tablet Take 1 tablet (500 mg total) by mouth at bedtime. 05/22/22   Raulkar, Drema Pry, MD  mirtazapine (REMERON) 7.5 MG tablet Take 7.5 mg by mouth at bedtime.    [provider]  Multiple Vitamin (MULTIVITAMIN WITH MINERALS) TABS tablet Take 1 tablet by mouth daily. 05/22/22   Raulkar, Drema Pry, MD  Multiple Vitamins-Minerals (ZINC PO) Take 1 tablet by mouth daily.    [provider]  Nutritional Supplements (ADULT NUTRITIONAL SUPPLEMENT PO) Take 1 Dose by mouth 4 (four) times daily. [employer] shake    [provider]  ondansetron (ZOFRAN) 4 MG tablet Take 1 tablet (4 mg total) by mouth every 8 (eight) hours as needed for nausea or vomiting. 10/17/22   Tegeler, Canary Brim, MD  PAXLOVID, 300/100, 20 x 150 MG & 10 x 100MG  TBPK Take 1 tablet by mouth 2 (two) times daily. For 5 days 09/02/22   [provider]  sertraline (ZOLOFT) 50 MG tablet Take 1 tablet (50 mg total) by mouth at bedtime. 06/22/22   Raulkar, Drema Pry, MD      Allergies    Lisinopril    Review of Systems   Review of Systems  Unable to perform ROS: Mental status change  All other systems reviewed and are negative.   Physical Exam Updated Vital Signs SpO2 95%  Physical Exam Vitals and nursing note reviewed.  Constitutional:      General: She is not in acute distress.    Appearance: Normal appearance. She is ill-appearing (Chronically ill-appearing).  HENT:     Head: Normocephalic and atraumatic.     Comments: Abrasion noted to right side of forehead with small hematoma.  No laceration that will require repair.    Nose: Nose normal.  Eyes:     General: No scleral icterus.    Extraocular Movements: Extraocular movements intact.     Conjunctiva/sclera: Conjunctivae normal.  Cardiovascular:     Rate and Rhythm: Normal rate and regular rhythm.     Pulses: Normal pulses.  Pulmonary:     Effort: Pulmonary effort is normal.  No respiratory distress.     Breath sounds: Normal breath sounds. No wheezing or rales.  Abdominal:     General: There is no distension.     Palpations: Abdomen is soft.     Tenderness: There is no abdominal tenderness. There is no guarding.  Musculoskeletal:     Cervical back: Normal range of motion.  Skin:    General: Skin is warm and dry.  Neurological:     General: No focal deficit present.     Mental Status: She is alert and oriented to person, place, and time. Mental status is at baseline.     ED Results / Procedures / Treatments  Labs (all labs ordered are listed, but only abnormal results are displayed) Labs Reviewed  COMPREHENSIVE METABOLIC PANEL  CBC  PROTIME-INR  CK TOTAL AND CKMB (NOT AT [facility])  I-STAT CHEM 8, ED  TROPONIN I (HIGH SENSITIVITY)    EKG None  Radiology No results found.  Procedures Procedures    Medications Ordered in ED Medications - No data to display  ED Course/ Medical Decision Making/ A&P                           Medical Decision Making Amount and/or Complexity of Data Reviewed Labs: ordered. Radiology: ordered.  Risk Prescription drug management.   Medical Decision Making / ED Course   This patient presents to the ED for concern of fall, this involves an extensive number of treatment options, and is a complaint that carries with it a high risk of complications and morbidity.  The differential diagnosis includes head injury, and cervical fracture, joint fracture, strain  MDM: 51 year old female presents with above-mentioned complaint.  History of CVA, PE, hypertension.  She is on chronic anticoagulation.  She also has paranoia and delusions which per daughter she occasionally has but today's episode is worse.  Denies SI or HI.  Does have visual hallucinations throughout my interview.  Patient initially refused imaging or any workup until the person in her hallucination left the room.  Discussion had with family who was in  agreement to give patient a dose of Haldol and Ativan to facilitate the workup.  Following this CT scan of the head and cervical spine were obtained which showed no acute findings.  Blood work was obtained for chest pain workup as well as medical clearance for psych eval. spoke to daughter [name] who is in agreement with plan.  At the end of my shift patient signed out to Will PA-C for follow-up on labs and TTS consult.   Lab Tests: -I ordered, reviewed, and interpreted labs.   The pertinent results include:   Labs Reviewed  COMPREHENSIVE METABOLIC PANEL  CBC  PROTIME-INR  CK TOTAL AND CKMB (NOT AT [facility])  ACETAMINOPHEN LEVEL  ETHANOL  RAPID URINE DRUG SCREEN, HOSP PERFORMED  SALICYLATE LEVEL  I-STAT CHEM 8, ED  TROPONIN I (HIGH SENSITIVITY)  TROPONIN I (HIGH SENSITIVITY)      EKG  EKG Interpretation  Date/Time:    Ventricular Rate:    PR Interval:    QRS Duration:   QT Interval:    QTC Calculation:   R Axis:     Text Interpretation:           Imaging Studies ordered: I ordered imaging studies including CT head, cervical spine.  Chest x-ray at the end of my shift is still pending. I independently visualized and interpreted imaging. I agree with the radiologist interpretation   Medicines ordered and prescription drug management: Meds ordered this encounter  Medications   haloperidol lactate (HALDOL) injection 5 mg   LORazepam (ATIVAN) injection 1 mg    -I have reviewed the patients home medicines and have made adjustments as needed  Reevaluation: After the interventions noted above, I reevaluated the patient and found that they have :stayed the same  Co morbidities that complicate the patient evaluation  Past Medical History:  Diagnosis Date   Hypertension       Dispostion: Patient at the end of my shift is awaiting labs, and TTS eval.  Final Clinical Impression(s) / ED Diagnoses Final diagnoses:  Fall, initial  encounter  Injury of head, initial  encounter    Rx / DC Orders ED Discharge Orders     None         Marita Kansas, PA-C 12/12/22 0008    Jacalyn Lefevre, MD 12/12/22 [phone]

## 2022-12-11 NOTE — ED Notes (Signed)
Patient transported to CT 

## 2022-12-11 NOTE — ED Provider Notes (Incomplete)
[facility] EMERGENCY DEPARTMENT Provider Note   CSN: [id number] Arrival date & time: 12/11/22  1815     History {Add pertinent medical, surgical, social history, OB history to HPI:1} Chief Complaint  Patient presents with  . Fall    Sarah Myers is a 51 y.o. female.  51 year old female with past medical history significant for CVA, left-sided deficits, baseline cognitive delay who presents today from [facility] nursing home for evaluation following fall.  She is on chronic anticoagulation with Eliquis.  Patient states she was adjusting herself on the bed when she fell off to the side of the bed striking her head.  She has a hematoma to the right side of her forehead.  Complains of neck pain, and generalized body soreness.  Denies nausea, vomiting.  Her daughter is at bedside with her.  Patient states that she is not very trusting at this time and would like her daughter at bedside throughout her entire visit.  According to patient's daughter since patient's CVA she has had moments where she becomes delusional and paranoid.  She currently is protruding paranoia and has visual hallucinations.  She believes that someone else is in the room besides myself and her daughter.  She is requesting that person be removed.  She believes her privacy has been violated.  She is not redirectable. patient also endorses some chest tightness that has been ongoing since this morning.  This started prior to the fall.  Patient also states that this morning she did not take her medications.  She states after these were given to her she spit them out.  Last time she took her home medications were last night.  Attempted to get hold of nursing home twice without success.  The history is provided by the patient. No language interpreter was used.       Home Medications Prior to Admission medications   Medication Sig Start Date End Date Taking? Authorizing Provider  acetaminophen  (TYLENOL) 500 MG tablet Take 1,000 mg by mouth every 6 (six) hours as needed for moderate pain.    [provider]  Amantadine HCl 100 MG tablet Take 100 mg by mouth 2 (two) times daily. 09/15/22   [provider]  amLODipine (NORVASC) 10 MG tablet Take 1 tablet (10 mg total) by mouth daily. 06/22/22   Raulkar, Drema Pry, MD  apixaban (ELIQUIS) 5 MG TABS tablet Take 1 tablet (5 mg total) by mouth 2 (two) times daily. 06/22/22   Raulkar, Drema Pry, MD  ascorbic acid (VITAMIN C) 500 MG tablet Take 500 mg by mouth 2 (two) times daily.    [provider]  b complex vitamins capsule Take 1 capsule by mouth daily. For Covid    [provider]  baclofen (LIORESAL) 10 MG tablet Take 15 mg by mouth 2 (two) times daily. 06/13/22   [provider]  busPIRone (BUSPAR) 5 MG tablet Take 1 tablet (5 mg total) by mouth 3 (three) times daily as needed. Patient not taking: Reported on 11/26/2022 06/23/22   Horton Chin, MD  carvedilol (COREG) 6.25 MG tablet Take 1 tablet (6.25 mg total) by mouth 2 (two) times daily with a meal. 06/22/22   Raulkar, Drema Pry, MD  cephALEXin (KEFLEX) 500 MG capsule Take 1 capsule (500 mg total) by mouth every 12 (twelve) hours. 12/01/22   Danford, Earl Lites, MD  cholecalciferol (VITAMIN D3) 25 MCG (1000 UNIT) tablet Take 1,000 Units by mouth daily.    [provider]  famotidine (PEPCID) 20 MG tablet Take 1 tablet (20 mg total) by mouth 2 (two) times daily. 05/22/22   Raulkar, Drema Pry, MD  gabapentin (NEURONTIN) 100 MG capsule Take 1 capsule (100 mg total) by mouth 2 (two) times daily. 05/24/22   Raulkar, Drema Pry, MD  hydrALAZINE (APRESOLINE) 25 MG tablet Take 25 mg by mouth every 6 (six) hours. 10/10/22   [provider]  hydrOXYzine (ATARAX) 10 MG tablet Take 1 tablet (10 mg total) by mouth 3 (three) times daily as needed. Patient not taking: Reported on 11/26/2022 06/09/22   Horton Chin, MD  losartan (COZAAR) 25 MG  tablet Take 1 tablet (25 mg total) by mouth daily. 06/09/22   Raulkar, Drema Pry, MD  magnesium gluconate (MAGONATE) 500 MG tablet Take 1 tablet (500 mg total) by mouth at bedtime. 05/22/22   Raulkar, Drema Pry, MD  mirtazapine (REMERON) 7.5 MG tablet Take 7.5 mg by mouth at bedtime.    [provider]  Multiple Vitamin (MULTIVITAMIN WITH MINERALS) TABS tablet Take 1 tablet by mouth daily. 05/22/22   Raulkar, Drema Pry, MD  Multiple Vitamins-Minerals (ZINC PO) Take 1 tablet by mouth daily.    [provider]  Nutritional Supplements (ADULT NUTRITIONAL SUPPLEMENT PO) Take 1 Dose by mouth 4 (four) times daily. [employer] shake    [provider]  ondansetron (ZOFRAN) 4 MG tablet Take 1 tablet (4 mg total) by mouth every 8 (eight) hours as needed for nausea or vomiting. 10/17/22   Tegeler, Canary Brim, MD  PAXLOVID, 300/100, 20 x 150 MG & 10 x 100MG  TBPK Take 1 tablet by mouth 2 (two) times daily. For 5 days 09/02/22   [provider]  sertraline (ZOLOFT) 50 MG tablet Take 1 tablet (50 mg total) by mouth at bedtime. 06/22/22   Raulkar, Drema Pry, MD      Allergies    Lisinopril    Review of Systems   Review of Systems  Unable to perform ROS: Mental status change  All other systems reviewed and are negative.   Physical Exam Updated Vital Signs SpO2 95%  Physical Exam Vitals and nursing note reviewed.  Constitutional:      General: She is not in acute distress.    Appearance: Normal appearance. She is ill-appearing (Chronically ill-appearing).  HENT:     Head: Normocephalic and atraumatic.     Comments: Abrasion noted to right side of forehead with small hematoma.  No laceration that will require repair.    Nose: Nose normal.  Eyes:     General: No scleral icterus.    Extraocular Movements: Extraocular movements intact.     Conjunctiva/sclera: Conjunctivae normal.  Cardiovascular:     Rate and Rhythm: Normal rate and regular rhythm.     Pulses:  Normal pulses.  Pulmonary:     Effort: Pulmonary effort is normal. No respiratory distress.     Breath sounds: Normal breath sounds. No wheezing or rales.  Abdominal:     General: There is no distension.     Palpations: Abdomen is soft.     Tenderness: There is no abdominal tenderness. There is no guarding.  Musculoskeletal:     Cervical back: Normal range of motion.  Skin:    General: Skin is warm and dry.  Neurological:     General: No focal deficit present.     Mental Status: She is alert and oriented to person, place, and time. Mental status is at baseline.  ED Results / Procedures / Treatments   Labs (all labs ordered are listed, but only abnormal results are displayed) Labs Reviewed  COMPREHENSIVE METABOLIC PANEL  CBC  PROTIME-INR  CK TOTAL AND CKMB (NOT AT [facility])  I-STAT CHEM 8, ED  TROPONIN I (HIGH SENSITIVITY)    EKG None  Radiology No results found.  Procedures Procedures  {Document cardiac monitor, telemetry assessment procedure when appropriate:1}  Medications Ordered in ED Medications - No data to display  ED Course/ Medical Decision Making/ A&P                           Medical Decision Making Amount and/or Complexity of Data Reviewed Labs: ordered. Radiology: ordered.  Risk Prescription drug management.   Medical Decision Making / ED Course   This patient presents to the ED for concern of fall, this involves an extensive number of treatment options, and is a complaint that carries with it a high risk of complications and morbidity.  The differential diagnosis includes head injury, and cervical fracture, joint fracture, strain  MDM: 51 year old female presents with above-mentioned complaint.  History of CVA, PE, hypertension.  She is on chronic anticoagulation.  She also has paranoia and delusions which per daughter she occasionally has but today's episode is worse.  Denies SI or HI.  Does have visual hallucinations throughout my interview.   Patient initially refused imaging or any workup until the person in her hallucination left the room.  Discussion had with family who was in agreement to give patient a dose of Haldol and Ativan to facilitate the workup.  Following this CT scan of the head and cervical spine were obtained which showed no acute findings.  Blood work was obtained for chest pain workup as well as medical clearance for psych eval.  At the end of my shift patient signed out to Will PA-C for follow-up on labs and TTS consult.   Lab Tests: -I ordered, reviewed, and interpreted labs.   The pertinent results include:   Labs Reviewed  COMPREHENSIVE METABOLIC PANEL  CBC  PROTIME-INR  CK TOTAL AND CKMB (NOT AT [facility])  ACETAMINOPHEN LEVEL  ETHANOL  RAPID URINE DRUG SCREEN, HOSP PERFORMED  SALICYLATE LEVEL  I-STAT CHEM 8, ED  TROPONIN I (HIGH SENSITIVITY)  TROPONIN I (HIGH SENSITIVITY)      EKG  EKG Interpretation  Date/Time:    Ventricular Rate:    PR Interval:    QRS Duration:   QT Interval:    QTC Calculation:   R Axis:     Text Interpretation:           Imaging Studies ordered: I ordered imaging studies including CT head, cervical spine.  Chest x-ray at the end of my shift is still pending. I independently visualized and interpreted imaging. I agree with the radiologist interpretation   Medicines ordered and prescription drug management: Meds ordered this encounter  Medications  . haloperidol lactate (HALDOL) injection 5 mg  . LORazepam (ATIVAN) injection 1 mg    -I have reviewed the patients home medicines and have made adjustments as needed  Reevaluation: After the interventions noted above, I reevaluated the patient and found that they have :stayed the same  Co morbidities that complicate the patient evaluation . Past Medical History:  Diagnosis Date  . Hypertension       Dispostion: Patient at the end of my shift is awaiting labs, and TTS eval.  Final Clinical Impression(s) /  ED  Diagnoses Final diagnoses:  Fall, initial encounter  Injury of head, initial encounter    Rx / DC Orders ED Discharge Orders     None

## 2022-12-12 ENCOUNTER — Other Ambulatory Visit: Payer: Self-pay

## 2022-12-12 ENCOUNTER — Emergency Department (HOSPITAL_COMMUNITY): Payer: Commercial Managed Care - HMO

## 2022-12-12 DIAGNOSIS — R441 Visual hallucinations: Secondary | ICD-10-CM

## 2022-12-12 LAB — COMPREHENSIVE METABOLIC PANEL
ALT: 16 U/L (ref 0–44)
AST: 20 U/L (ref 15–41)
Albumin: 3.4 g/dL — ABNORMAL LOW (ref 3.5–5.0)
Alkaline Phosphatase: 54 U/L (ref 38–126)
Anion gap: 5 (ref 5–15)
BUN: 9 mg/dL (ref 6–20)
CO2: 24 mmol/L (ref 22–32)
Calcium: 10.7 mg/dL — ABNORMAL HIGH (ref 8.9–10.3)
Chloride: 111 mmol/L (ref 98–111)
Creatinine, Ser: 0.81 mg/dL (ref 0.44–1.00)
GFR, Estimated: >60 mL/min (ref >60)
Glucose, Bld: 89 mg/dL (ref 70–99)
Potassium: 3 mmol/L — ABNORMAL LOW (ref 3.5–5.1)
Sodium: 140 mmol/L (ref 135–145)
Total Bilirubin: 0.6 mg/dL (ref 0.3–1.2)
Total Protein: 6.4 g/dL — ABNORMAL LOW (ref 6.5–8.1)

## 2022-12-12 LAB — RAPID URINE DRUG SCREEN, HOSP PERFORMED
Amphetamines: NOT DETECTED
Barbiturates: NOT DETECTED
Benzodiazepines: NOT DETECTED
Cocaine: NOT DETECTED
Opiates: NOT DETECTED
Tetrahydrocannabinol: NOT DETECTED

## 2022-12-12 LAB — URINALYSIS, ROUTINE W REFLEX MICROSCOPIC
Bilirubin Urine: NEGATIVE
Glucose, UA: NEGATIVE mg/dL
Hgb urine dipstick: NEGATIVE
Ketones, ur: NEGATIVE mg/dL
Leukocytes,Ua: NEGATIVE
Nitrite: NEGATIVE
Protein, ur: NEGATIVE mg/dL
Specific Gravity, Urine: 1.001 — ABNORMAL LOW (ref 1.005–1.030)
pH: 6 (ref 5.0–8.0)

## 2022-12-12 LAB — MAGNESIUM: Magnesium: 1.9 mg/dL (ref 1.7–2.4)

## 2022-12-12 LAB — CK TOTAL AND CKMB (NOT AT ARMC)
CK, MB: 1.7 ng/mL (ref 0.5–5.0)
Relative Index: 1 (ref 0.0–2.5)
Total CK: 165 U/L (ref 38–234)

## 2022-12-12 LAB — TROPONIN I (HIGH SENSITIVITY)
Troponin I (High Sensitivity): 9 ng/L (ref <18)
Troponin I (High Sensitivity): 9 ng/L (ref <18)

## 2022-12-12 LAB — ETHANOL: Alcohol, Ethyl (B): <10 mg/dL (ref <10)

## 2022-12-12 LAB — SALICYLATE LEVEL: Salicylate Lvl: <7 mg/dL — ABNORMAL LOW (ref 7.0–30.0)

## 2022-12-12 LAB — ACETAMINOPHEN LEVEL: Acetaminophen (Tylenol), Serum: <10 ug/mL — ABNORMAL LOW (ref 10–30)

## 2022-12-12 MED ORDER — QUETIAPINE FUMARATE 25 MG PO TABS
25.0000 mg | ORAL_TABLET | Freq: Once | ORAL | Status: AC
  Administered 2022-12-12: 25 mg via ORAL
  Filled 2022-12-12: qty 1

## 2022-12-12 MED ORDER — QUETIAPINE FUMARATE 50 MG PO TABS: 50.0000 mg | ORAL_TABLET | Freq: Every day | ORAL | 0 refills | Status: DC

## 2022-12-12 MED ORDER — POTASSIUM CHLORIDE CRYS ER 20 MEQ PO TBCR
40.0000 meq | EXTENDED_RELEASE_TABLET | Freq: Once | ORAL | Status: AC
  Administered 2022-12-12: 40 meq via ORAL
  Filled 2022-12-12: qty 2

## 2022-12-12 NOTE — ED Provider Notes (Signed)
Accepted handoff at shift change from Truitt Leep PA-C. Please see prior provider note for more detail.   Briefly: Patient is 51 y.o.    Plan: Follow-up on troponin and chest x-ray     Physical Exam  BP (!) 161/103   Pulse 71   Temp 98.5 F (36.9 C) (Oral)   Resp 10   SpO2 94%   Physical Exam  Procedures  Procedures Results for orders placed or performed during the hospital encounter of 12/11/22  Comprehensive metabolic panel  Result Value Ref Range   Sodium 140 135 - 145 mmol/L   Potassium 3.0 (L) 3.5 - 5.1 mmol/L   Chloride 111 98 - 111 mmol/L   CO2 24 22 - 32 mmol/L   Glucose, Bld 89 70 - 99 mg/dL   BUN 9 6 - 20 mg/dL   Creatinine, Ser 9.62 0.44 - 1.00 mg/dL   Calcium 95.2 (H) 8.9 - 10.3 mg/dL   Total Protein 6.4 (L) 6.5 - 8.1 g/dL   Albumin 3.4 (L) 3.5 - 5.0 g/dL   AST 20 15 - 41 U/L   ALT 16 0 - 44 U/L   Alkaline Phosphatase 54 38 - 126 U/L   Total Bilirubin 0.6 0.3 - 1.2 mg/dL   GFR, Estimated >84 >13 mL/min   Anion gap 5 5 - 15  CBC  Result Value Ref Range   WBC 6.5 4.0 - 10.5 K/uL   RBC 4.02 3.87 - 5.11 MIL/uL   Hemoglobin 10.9 (L) 12.0 - 15.0 g/dL   HCT 24.4 (L) 01.0 - 27.2 %   MCV 85.8 80.0 - 100.0 fL   MCH 27.1 26.0 - 34.0 pg   MCHC 31.6 30.0 - 36.0 g/dL   RDW 53.6 64.4 - 03.4 %   Platelets 241 150 - 400 K/uL   nRBC 0.0 0.0 - 0.2 %  Protime-INR  Result Value Ref Range   Prothrombin Time 15.3 (H) 11.4 - 15.2 seconds   INR 1.2 0.8 - 1.2  CK total and CKMB  Result Value Ref Range   Total CK 165 38 - 234 U/L   CK, MB 1.7 0.5 - 5.0 ng/mL   Relative Index 1.0 0.0 - 2.5  Acetaminophen level  Result Value Ref Range   Acetaminophen (Tylenol), Serum <10 (L) 10 - 30 ug/mL  Ethanol  Result Value Ref Range   Alcohol, Ethyl (B) <10 <10 mg/dL  Rapid urine drug screen (hospital performed)  Result Value Ref Range   Opiates NONE DETECTED NONE DETECTED   Cocaine NONE DETECTED NONE DETECTED   Benzodiazepines NONE DETECTED NONE DETECTED   Amphetamines  NONE DETECTED NONE DETECTED   Tetrahydrocannabinol NONE DETECTED NONE DETECTED   Barbiturates NONE DETECTED NONE DETECTED  Salicylate level  Result Value Ref Range   Salicylate Lvl <7.0 (L) 7.0 - 30.0 mg/dL  Urinalysis, Routine w reflex microscopic  Result Value Ref Range   Color, Urine STRAW (A) YELLOW   APPearance CLEAR CLEAR   Specific Gravity, Urine 1.001 (L) 1.005 - 1.030   pH 6.0 5.0 - 8.0   Glucose, UA NEGATIVE NEGATIVE mg/dL   Hgb urine dipstick NEGATIVE NEGATIVE   Bilirubin Urine NEGATIVE NEGATIVE   Ketones, ur NEGATIVE NEGATIVE mg/dL   Protein, ur NEGATIVE NEGATIVE mg/dL   Nitrite NEGATIVE NEGATIVE   Leukocytes,Ua NEGATIVE NEGATIVE  Troponin I (High Sensitivity)  Result Value Ref Range   Troponin I (High Sensitivity) 9 <18 ng/L   CT Lumbar Spine Wo Contrast  Result Date: 12/12/2022 CLINICAL  DATA:  Fall. EXAM: CT LUMBAR SPINE WITHOUT CONTRAST TECHNIQUE: Multidetector CT imaging of the lumbar spine was performed without intravenous contrast administration. Multiplanar CT image reconstructions were also generated. RADIATION DOSE REDUCTION: This exam was performed according to the departmental dose-optimization program which includes automated exposure control, adjustment of the mA and/or kV according to patient size and/or use of iterative reconstruction technique. COMPARISON:  None Available. FINDINGS: Segmentation: 5 lumbar type vertebrae. Alignment: Normal. Vertebrae: No acute fracture or focal pathologic process. Paraspinal and other soft tissues: Negative. Disc levels: Diffusely preserved disc height. Mild lower lumbar disc bulging. Mild L4-5 facet spurring. IMPRESSION: No acute finding or visible impingement. Electronically Signed   By: Tiburcio Pea M.D.   On: 12/12/2022 04:32   CT HEAD WO CONTRAST  Result Date: 12/11/2022 CLINICAL DATA:  Head trauma, moderate-severe; Polytrauma, blunt. Fall, facial injury. EXAM: CT HEAD WITHOUT CONTRAST CT CERVICAL SPINE WITHOUT  CONTRAST TECHNIQUE: Multidetector CT imaging of the head and cervical spine was performed following the standard protocol without intravenous contrast. Multiplanar CT image reconstructions of the cervical spine were also generated. RADIATION DOSE REDUCTION: This exam was performed according to the departmental dose-optimization program which includes automated exposure control, adjustment of the mA and/or kV according to patient size and/or use of iterative reconstruction technique. 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 Parenchymal volume loss is commensurate with the patient's age. Mild periventricular white matter changes are present likely reflecting the sequela of small vessel ischemia. Remote infarcts within the right corona radiata, right pons and left occipital cortex with compensatory ex vacuo dilation of the occipital horn of the left lateral ventricle again noted. No abnormal intra or extra-axial mass lesion or fluid collection. No abnormal mass effect or midline shift. No evidence of acute intracranial hemorrhage or infarct. Ventricular size is normal. Cerebellum unremarkable. Vascular: No asymmetric hyperdense vasculature at the skull base. Skull: Intact Sinuses/Orbits: Paranasal sinuses are clear. Orbits are unremarkable. Other: Mastoid air cells and middle ear cavities are clear. CT CERVICAL SPINE FINDINGS Alignment: Cervical kyphosis is likely positional in nature. No listhesis. Skull base and vertebrae: Craniocervical alignment is normal. The atlantodental interval is not widened. No acute fracture of the cervical spine. Vertebral body height is preserved. Soft tissues and spinal canal: No prevertebral fluid or swelling. No visible canal  hematoma. Disc levels: Mild endplate remodeling and disc space narrowing at C5-C7 in keeping with changes of mild degenerative disc disease. Prevertebral soft tissues are not thickened on sagittal reformats. Spinal canal is widely patent. No significant neuroforaminal narrowing. Upper chest: Negative. Other: None IMPRESSION: 1. No acute intracranial abnormality. No calvarial fracture. 2. Mild senescent change.  Stable remote infarcts. 3. 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 These appear mildly enlarged since remote prior examination of 2013. 4. No acute fracture or listhesis of the cervical spine. Electronically Signed   By: Helyn Numbers M.D.   On: 12/11/2022 22:36   CT CERVICAL SPINE WO CONTRAST  Result Date: 12/11/2022 CLINICAL DATA:  Head trauma, moderate-severe; Polytrauma, blunt. Fall, facial injury. EXAM: CT HEAD WITHOUT CONTRAST CT CERVICAL SPINE WITHOUT CONTRAST TECHNIQUE: Multidetector CT imaging of the head and cervical spine was performed following the standard protocol without intravenous contrast. Multiplanar  CT image reconstructions of the cervical spine were also generated. RADIATION DOSE REDUCTION: This exam was performed according to the departmental dose-optimization program which includes automated exposure control, adjustment of the mA and/or kV according to patient size and/or use of iterative reconstruction technique. 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 Parenchymal volume loss is commensurate with the patient's age. Mild periventricular white matter changes are present likely reflecting the sequela of small vessel  ischemia. Remote infarcts within the right corona radiata, right pons and left occipital cortex with compensatory ex vacuo dilation of the occipital horn of the left lateral ventricle again noted. No abnormal intra or extra-axial mass lesion or fluid collection. No abnormal mass effect or midline shift. No evidence of acute intracranial hemorrhage or infarct. Ventricular size is normal. Cerebellum unremarkable. Vascular: No asymmetric hyperdense vasculature at the skull base. Skull: Intact Sinuses/Orbits: Paranasal sinuses are clear. Orbits are unremarkable. Other: Mastoid air cells and middle ear cavities are clear. CT CERVICAL SPINE FINDINGS Alignment: Cervical kyphosis is likely positional in nature. No listhesis. Skull base and vertebrae: Craniocervical alignment is normal. The atlantodental interval is not widened. No acute fracture of the cervical spine. Vertebral body height is preserved. Soft tissues and spinal canal: No prevertebral fluid or swelling. No visible canal hematoma. Disc levels: Mild endplate remodeling and disc space narrowing at C5-C7 in keeping with changes of mild degenerative disc disease. Prevertebral soft tissues are not thickened on sagittal reformats. Spinal canal is widely patent. No significant neuroforaminal narrowing. Upper chest: Negative. Other: None IMPRESSION: 1. No acute intracranial abnormality. No calvarial fracture. 2. Mild senescent change.  Stable remote infarcts. 3. 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 These appear mildly enlarged since remote prior examination of 2013. 4. No acute fracture or listhesis of the cervical spine. Electronically Signed   By: Helyn Numbers M.D.   On: 12/11/2022 22:36   CT HEAD WO CONTRAST  Result Date: 11/26/2022 CLINICAL DATA:  Polytrauma. EXAM: CT HEAD WITHOUT CONTRAST CT CERVICAL SPINE WITHOUT CONTRAST TECHNIQUE:  Multidetector CT imaging of the head and cervical spine was performed following the standard protocol without intravenous contrast. Multiplanar CT image reconstructions of the cervical spine were also generated. RADIATION DOSE REDUCTION: This exam was performed according to the departmental dose-optimization program which includes automated exposure control, adjustment of the mA and/or kV according to patient size and/or use of iterative reconstruction technique. COMPARISON:  None Available. 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 No evidence of hemorrhage. No hydrocephalus. No extra-axial fluid collection. Likely findings of transneural degeneration in the right cereberal peduncle. Vascular: No hyperdense vessel or unexpected calcification. Skull: Normal. Negative for fracture or focal lesion. Sinuses/Orbits: No acute finding. Unchanged appearance of the bilateral ethmoid sinuses with a somewhat expansile appearance. Other: Odontogenic disease with a periapical lucency along a right maxillary molar CT CERVICAL SPINE FINDINGS Alignment: Straightening of the normal cervical lordosis. Skull base and vertebrae: No acute fracture. No primary bone lesion or focal pathologic process. Soft tissues and spinal canal: No prevertebral fluid or swelling. No visible canal hematoma. Disc levels:  No evidence of high-grade  spinal canal stenosis. Upper chest: Negative. Other: None IMPRESSION: 1. No acute intracranial abnormality. 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 2. No acute cervical spine fracture or traumatic listhesis. Electronically Signed   By: Lorenza Cambridge M.D.   On: 11/26/2022 13:44   CT CERVICAL SPINE WO CONTRAST  Result Date: 11/26/2022 CLINICAL  DATA:  Polytrauma. EXAM: CT HEAD WITHOUT CONTRAST CT CERVICAL SPINE WITHOUT CONTRAST TECHNIQUE: Multidetector CT imaging of the head and cervical spine was performed following the standard protocol without intravenous contrast. Multiplanar CT image reconstructions of the cervical spine were also generated. RADIATION DOSE REDUCTION: This exam was performed according to the departmental dose-optimization program which includes automated exposure control, adjustment of the mA and/or kV according to patient size and/or use of iterative reconstruction technique. COMPARISON:  None Available. 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 No evidence of hemorrhage. No hydrocephalus. No extra-axial fluid collection. Likely findings of transneural degeneration in the right cereberal peduncle. Vascular: No hyperdense vessel or unexpected calcification. Skull: Normal. Negative for fracture or focal lesion. Sinuses/Orbits: No acute finding. Unchanged appearance of the bilateral ethmoid sinuses with a somewhat expansile appearance. Other: Odontogenic disease with a periapical lucency along a right maxillary molar CT CERVICAL SPINE FINDINGS Alignment: Straightening of the normal cervical lordosis. Skull base and vertebrae: No acute fracture. No primary bone lesion or focal pathologic process. Soft tissues and spinal canal: No prevertebral fluid or swelling. No visible canal hematoma. Disc levels:  No evidence of high-grade spinal canal stenosis. Upper chest: Negative. Other: None IMPRESSION: 1. No acute intracranial abnormality. 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 2. No acute cervical spine fracture or traumatic listhesis. Electronically Signed   By: Lorenza Cambridge  M.D.   On: 11/26/2022 13:44   DG Pelvis Portable  Result Date: 11/26/2022 CLINICAL DATA:  Fall. EXAM: PORTABLE PELVIS 1-2 VIEWS COMPARISON:  03/22/2022 FINDINGS: Minimal symmetric degenerative changes of the hips. No evidence of acute fracture or dislocation. Remainder the exam is unchanged. IMPRESSION: 1. No acute findings. 2. Minimal symmetric degenerative changes of the hips. Electronically Signed   By: Elberta Fortis M.D.   On: 11/26/2022 12:39   DG Chest Port 1 View  Result Date: 11/26/2022 CLINICAL DATA:  Fall. EXAM: PORTABLE CHEST 1 VIEW COMPARISON:  10/18/2022 FINDINGS: Lungs are adequately inflated without focal airspace consolidation, effusion or pneumothorax. Cardiomediastinal silhouette is normal. Bones and soft tissues are normal. IMPRESSION: No active disease. Electronically Signed   By: Elberta Fortis M.D.   On: 11/26/2022 12:37     ED Course / MDM    Medical Decision Making Amount and/or Complexity of Data Reviewed Labs: ordered. Radiology: ordered.  Risk Prescription drug management.   Per prior provider: low suspicion CP --> Waiting for second trop and then med clear for TTS.   Second round within normal limits.  Chest x-ray unremarkable.  TTS consultation placed.  Patient has had home medications ordered and I placed a diet order.  She is voluntary      Solon Augusta [place], [place] 12/12/22 0749    Mardene Sayer, MD 12/13/22 [phone]

## 2022-12-12 NOTE — ED Provider Notes (Signed)
Received at shift change from Sarah Kansas, PA-C please see note for full detail  In short patient with medical history including hypertension, CVA with left-sided weakness, cognitive delay, presenting from [facility] due to fall.  Patient states that she fell out of her bed, states she hit her head twice, she states she is having some slight back pain but no other complaints.  Per previous provider follow-up on lab work imaging, if all unremarkable consulting with TTS for evaluation as she is exhibiting delusions and paranoia. Physical Exam  BP (!) 161/103   Pulse 71   Temp 98.5 F (36.9 C) (Oral)   Resp 10   SpO2 94%   Physical Exam Vitals and nursing note reviewed.  Constitutional:      General: She is not in acute distress.    Appearance: She is not ill-appearing.  HENT:     Head: Normocephalic and atraumatic.     Comments: No deformity of the head present no raccoon eyes or Battle sign noted.    Nose: No congestion.     Mouth/Throat:     Comments: No trismus no torticollis no oral Eyes:     Conjunctiva/sclera: Conjunctivae normal.  Cardiovascular:     Rate and Rhythm: Normal rate and regular rhythm.     Pulses: Normal pulses.     Heart sounds: No murmur heard.    No friction rub. No gallop.  Pulmonary:     Effort: No respiratory distress.     Breath sounds: No wheezing, rhonchi or rales.  Abdominal:     Palpations: Abdomen is soft.     Tenderness: There is no abdominal tenderness. There is no right CVA tenderness or left CVA tenderness.  Musculoskeletal:     Comments: Spine was palpated she had noted tenderness in her lower lumbar region without deformities present, there is no pelvis instability no leg shortening.  She has no tenderness on her upper and or lower extremities.  Skin:    General: Skin is warm and dry.  Neurological:     Mental Status: She is alert.     Comments: No facial asymmetry no difficulty with word finding, following two-step commands,, she has  no new deficits still has left -sided weakness.  Psychiatric:        Mood and Affect: Mood normal.     Comments: Denies any suicidal homicidal ideations.      Procedures  Procedures  ED Course / MDM    Medical Decision Making Amount and/or Complexity of Data Reviewed Labs: ordered. Radiology: ordered.  Risk Prescription drug management.    Lab Tests:  I Ordered, and personally interpreted labs.  The pertinent results include: CBC shows stable anemia hemoglobin at 10.9, CMP shows potassium 3.0, calcium 10, ethanol negative CK negative, salicyate negative, troponin is 9, UA unremarkable   Imaging Studies ordered:  I ordered imaging studies including CT head, C-spine, CT lumbar spine.  I independently visualized and interpreted imaging which showed CT imaging all negative for acute findings I agree with the radiologist interpretation   Cardiac Monitoring:  The patient was maintained on a cardiac monitor.  I personally viewed and interpreted the cardiac monitored which showed an underlying rhythm of: Without signs of ischemia   Medicines ordered and prescription drug management:  I ordered medication including N/A I have reviewed the patients home medicines and have made adjustments as needed  Critical Interventions:  N/A   Reevaluation:  Presents for fall as well as psychosis, on my exam  she is having some lower lumbar tenderness, will obtain CT imaging, and repeat second troponins.  Patient has a low potassium we will provide with oral potassium and add on a magnesium.      Consultations Obtained:  N/A    Test Considered:  Will defer CT chest abdomen pelvis as my suspicion for  intrathoracic/intra-abdominal trauma is low as both chest and abdomen are nontender to palpation there is no evidence of trauma present my exam    Rule out low suspicion for intracranial head bleed as patient denies loss of conscious, she does not endorse headaches,  paresthesia/weakness in the upper and lower extremities, no focal deficits present on my exam ct head is negative.  Low suspicion for spinal fracture CT C-spine and lumbar spine negative for acute findings.  Low suspicion for orthopedic injury as imaging is negative for acute findings.  Suspicion for intracranial mass is low as CT imaging is negative.  I doubt metabolic derailment causing delusions as lab work is all unremarkable.  Suspicion for ACS is also low she has no cardiac history, presentation is atypical, EKG without signs of ischemia, for troponin is negative, second troponin is pending at this time.     Dispostion and problem list  Due to shift change patient will be handed over to [facility]   Follow-up on troponin as well as chest x-ray, as well as both are unremarkable, patient will be medically cleared and will need to be assessed by TTS for delusions.  Home meds have been ordered, she is not under IVC at this time           Aron Baba 12/12/22 [id number]    Maudie Flakes, MD 12/13/22 [phone]

## 2022-12-12 NOTE — Consult Note (Cosign Needed Addendum)
BH ED ASSESSMENT   Reason for Consult:  Eval Referring Physician:  Blanchie Dessert, [place] Patient Identification: Sarah Myers MRN:  [id number] ED Chief Complaint: Hallucination, visual  Diagnosis:  Principal Problem:   Hallucination, visual   ED Assessment Time Calculation: Start Time: 1000 Stop Time: 1045 Total Time in Minutes (Assessment Completion): 45   HPI:   Sarah Myers is a 51 y.o. female patient who was brought in by EMS from [place] after she had a fall.  Patient was disoriented and confused.  She did not believe the EMS was real.  She was very anxious requesting her family did not leave her side.  He was stating there was a person in the room, however family members and ED staff did not see the person she was referencing.  Psychiatry was consulted due to her subtle paranoia and hallucinations.  Patient has been medically cleared for TTS evaluation.  Subjective:   Patient seen at [facility], ED for face-to-face evaluation.  Patient is very pleasant and willing to engage in coherent conversation.  She tells me she has been staying at [facility], and misses living with her daughter and her uncle.  She tells me she is sad it is Christmas and she is out with her family.  She tells me about her Christmas tradition such as her family wearing pajamas and she cooks a large meal for the family.  She is alert and oriented x 4.  She is able to tell me her full name, date of birth, current month and year, current location, current president.  She tells me she fell at [facility], and came here for evaluation.  I asked her about the person in the room last night that she was seeing.  She stated "I am not crazy. I saw someone in the room last night. I am not seeing it today, but I know that's what I saw. I know you don't want to believe me since no one else saw it apparently."  Patient does report history of major depressive disorder, she stated when her husband passed away she had  some passive suicidal thoughts and was started on Zoloft.  Denies any other psychiatric history or psychotropic med trials. She denies any current suicidal ideations.  Denies any history of suicide attempts.  She denies auditory or visual hallucinations.  Patient stated since her stroke she has had periods where she feels like she is seeing something but her daughter states it is not true.  She states it is normally a person.  She denies any paranoia or thoughts that someone is trying to hurt her.  She feels safe at [facility] however tells me about how much she misses living with her family and cooking for them.  She denies any illicit substance use or alcohol use.  She reports good sleep, averaging around 6 hours per night.  No problems with appetite.   I spoke with her daughter, Sarah Myers, who confirms since her stroke she has had intermittent hallucinations where she will feel like there is another person in the room or gets paranoid about [place]. Spoke with daughter this could likely be side effects from stroke/falls or possible aging. Daughter denies pt having any history of delusions or hallucinations prior to stroke. Spoke with her about starting Seroquel, and recommending discharge back to [facility]. Daughter has no safety concerns at this time and is agreeable to plan.   Pt is able to engage in logical conversation. Thought  content intact. Does not appear to be responding to internal stimuli, no concerns for psychosis/mania at this time. Speech is normal in rate and tone. Ultimately, she does not meet criteria for inpatient psychiatric treatment. Will psychiatrically clear patient.   Past Psychiatric History:  MDD  Risk to Self or Others: Is the patient at risk to self? No Has the patient been a risk to self in the past 6 months? No Has the patient been a risk to self within the distant past? No Is the patient a risk to others? No Has the patient been a risk to others  in the past 6 months? No Has the patient been a risk to others within the distant past? No  [place] Scale:  Flowsheet Row ED from 12/11/2022 in [facility] EMERGENCY DEPARTMENT ED to Hosp-Admission (Discharged) from 11/26/2022 in [place] [facility] ED from 10/18/2022 in [facility] EMERGENCY DEPARTMENT  C-SSRS RISK CATEGORY No Risk No Risk No Risk       Past Medical History:  Past Medical History:  Diagnosis Date   Hypertension     Past Surgical History:  Procedure Laterality Date   BREAST LUMPECTOMY  1992   CESAREAN SECTION     IR GASTROSTOMY TUBE MOD SED  03/06/2022   ORIF RADIAL FRACTURE  02/10/2012   Procedure: OPEN REDUCTION INTERNAL FIXATION (ORIF) RADIAL FRACTURE;  Surgeon: Cammy Copa, MD;  Location: [facility] OR;  Service: Orthopedics;  Laterality: Right;   ORIF ULNAR FRACTURE  02/10/2012   Procedure: OPEN REDUCTION INTERNAL FIXATION (ORIF) ULNAR FRACTURE;  Surgeon: Cammy Copa, MD;  Location: [facility] OR;  Service: Orthopedics;  Laterality: Right;   TUBAL LIGATION     Family History:  Family History  Problem Relation Age of Onset   Diabetes Mother    Hypertension Mother    Heart disease Mother    Diabetes Maternal Aunt    Stroke Maternal Uncle    Diabetes Maternal Grandmother    Heart disease Maternal Grandmother    Diabetes Maternal Grandfather    Heart disease Maternal Grandfather    Heart disease Paternal Grandmother    Heart disease Paternal Grandfather    Social History:  Social History   Substance and Sexual Activity  Alcohol Use Not Currently   Alcohol/week: 0.0 standard drinks of alcohol     Social History   Substance and Sexual Activity  Drug Use No    Social History   Socioeconomic History   Marital status: Married    Spouse name: Not on file   Number of children: Not on file   Years of education: Not on file   Highest education level: Not on file  Occupational History   Not on file   Tobacco Use   Smoking status: Never   Smokeless tobacco: Never  Vaping Use   Vaping Use: Never used  Substance and Sexual Activity   Alcohol use: Not Currently    Alcohol/week: 0.0 standard drinks of alcohol   Drug use: No   Sexual activity: Never  Other Topics Concern   Not on file  Social History Narrative   Not on file   Social Determinants of Health   Financial Resource Strain: Not on file  Food Insecurity: Not on file  Transportation Needs: Not on file  Physical Activity: Not on file  Stress: Not on file  Social Connections: Not on file    Allergies:   Allergies  Allergen Reactions   Lisinopril Cough  Labs:  Results for orders placed or performed during the hospital encounter of 12/11/22 (from the past 48 hour(s))  Comprehensive metabolic panel     Status: Abnormal   Collection Time: 12/11/22 11:00 PM  Result Value Ref Range   Sodium 140 135 - 145 mmol/L   Potassium 3.0 (L) 3.5 - 5.1 mmol/L   Chloride 111 98 - 111 mmol/L   CO2 24 22 - 32 mmol/L   Glucose, Bld 89 70 - 99 mg/dL    Comment: Glucose reference range applies only to samples taken after fasting for at least 8 hours.   BUN 9 6 - 20 mg/dL   Creatinine, Ser 7.42 0.44 - 1.00 mg/dL   Calcium 59.5 (H) 8.9 - 10.3 mg/dL   Total Protein 6.4 (L) 6.5 - 8.1 g/dL   Albumin 3.4 (L) 3.5 - 5.0 g/dL   AST 20 15 - 41 U/L   ALT 16 0 - 44 U/L   Alkaline Phosphatase 54 38 - 126 U/L   Total Bilirubin 0.6 0.3 - 1.2 mg/dL   GFR, Estimated >63 >87 mL/min    Comment: (NOTE) Calculated using the CKD-EPI Creatinine Equation (2021)    Anion gap 5 5 - 15    Comment: Performed at [facility], [address], [place], [place] [address]  CBC     Status: Abnormal   Collection Time: 12/11/22 11:00 PM  Result Value Ref Range   WBC 6.5 4.0 - 10.5 K/uL   RBC 4.02 3.87 - 5.11 MIL/uL   Hemoglobin 10.9 (L) 12.0 - 15.0 g/dL   HCT 29.5 (L) 18.8 - 41.6 %   MCV 85.8 80.0 - 100.0 fL   MCH 27.1 26.0 - 34.0 pg   MCHC 31.6  30.0 - 36.0 g/dL   RDW 60.6 30.1 - 60.1 %   Platelets 241 150 - 400 K/uL   nRBC 0.0 0.0 - 0.2 %    Comment: Performed at [facility], [address], [place], [place] [address]  Protime-INR     Status: Abnormal   Collection Time: 12/11/22 11:00 PM  Result Value Ref Range   Prothrombin Time 15.3 (H) 11.4 - 15.2 seconds   INR 1.2 0.8 - 1.2    Comment: (NOTE) INR goal varies based on device and disease states. Performed at [facility], [address] [place], [place] [address]   CK total and CKMB     Status: None   Collection Time: 12/11/22 11:00 PM  Result Value Ref Range   Total CK 165 38 - 234 U/L   CK, MB 1.7 0.5 - 5.0 ng/mL   Relative Index 1.0 0.0 - 2.5    Comment: Performed at [facility], [address] [place], [place] [address]  Troponin I (High Sensitivity)     Status: None   Collection Time: 12/11/22 11:00 PM  Result Value Ref Range   Troponin I (High Sensitivity) 9 <18 ng/L    Comment: (NOTE) Elevated high sensitivity troponin I (hsTnI) values and significant  changes across serial measurements may suggest ACS but many other  chronic and acute conditions are known to elevate hsTnI results.  Refer to the "Links" section for chest pain algorithms and additional  guidance. Performed at [facility], [address], [place], [place] [address]   Acetaminophen level     Status: Abnormal   Collection Time: 12/11/22 11:00 PM  Result Value Ref Range   Acetaminophen (Tylenol), Serum <10 (  L) 10 - 30 ug/mL    Comment: (NOTE) Therapeutic concentrations vary significantly. A range of 10-30 ug/mL  may be an effective concentration for many patients. However, some  are best treated at concentrations outside of this range. Acetaminophen concentrations >150 ug/mL at 4 hours after ingestion  and >50 ug/mL at 12 hours after ingestion are often associated with  toxic reactions.  Performed at [facility], [address], [place],  [place] [address]   Ethanol     Status: None   Collection Time: 12/11/22 11:00 PM  Result Value Ref Range   Alcohol, Ethyl (B) <10 <10 mg/dL    Comment: (NOTE) Lowest detectable limit for serum alcohol is 10 mg/dL.  For medical purposes only. Performed at [facility], [address] [place], [place] [address]   Salicylate level     Status: Abnormal   Collection Time: 12/11/22 11:00 PM  Result Value Ref Range   Salicylate Lvl <7.0 (L) 7.0 - 30.0 mg/dL    Comment: Performed at [facility], [address] [place], [place] [address]  Rapid urine drug screen (hospital performed)     Status: None   Collection Time: 12/12/22  3:24 AM  Result Value Ref Range   Opiates NONE DETECTED NONE DETECTED   Cocaine NONE DETECTED NONE DETECTED   Benzodiazepines NONE DETECTED NONE DETECTED   Amphetamines NONE DETECTED NONE DETECTED   Tetrahydrocannabinol NONE DETECTED NONE DETECTED   Barbiturates NONE DETECTED NONE DETECTED    Comment: (NOTE) DRUG SCREEN FOR MEDICAL PURPOSES ONLY.  IF CONFIRMATION IS NEEDED FOR ANY PURPOSE, NOTIFY LAB WITHIN 5 DAYS.  LOWEST DETECTABLE LIMITS FOR URINE DRUG SCREEN Drug Class                     Cutoff (ng/mL) Amphetamine and metabolites    1000 Barbiturate and metabolites    200 Benzodiazepine                 200 Opiates and metabolites        300 Cocaine and metabolites        300 THC                            50 Performed at [facility], [address], [place], [place] [address]   Urinalysis, Routine w reflex microscopic     Status: Abnormal   Collection Time: 12/12/22  3:24 AM  Result Value Ref Range   Color, Urine STRAW (A) YELLOW   APPearance CLEAR CLEAR   Specific Gravity, Urine 1.001 (L) 1.005 - 1.030   pH 6.0 5.0 - 8.0   Glucose, UA NEGATIVE NEGATIVE mg/dL   Hgb urine dipstick NEGATIVE NEGATIVE   Bilirubin Urine NEGATIVE NEGATIVE   Ketones, ur NEGATIVE NEGATIVE mg/dL   Protein, ur NEGATIVE NEGATIVE mg/dL   Nitrite NEGATIVE  NEGATIVE   Leukocytes,Ua NEGATIVE NEGATIVE    Comment: Performed at [facility], [address], [place], [place] [address]  Magnesium     Status: None   Collection Time: 12/12/22  5:57 AM  Result Value Ref Range   Magnesium 1.9 1.7 - 2.4 mg/dL    Comment: Performed at [facility], [address] [place], [place] [address]  Troponin I (High Sensitivity)     Status: None   Collection Time: 12/12/22  5:57 AM  Result Value Ref Range   Troponin  I (High Sensitivity) 9 <18 ng/L    Comment: (NOTE) Elevated high sensitivity troponin I (hsTnI) values and significant  changes across serial measurements may suggest ACS but many other  chronic and acute conditions are known to elevate hsTnI results.  Refer to the "Links" section for chest pain algorithms and additional  guidance. Performed at [facility], [address], [place], [place] [address]     Current Facility-Administered Medications  Medication Dose Route Frequency Provider Last Rate Last Admin   amLODipine (NORVASC) tablet 10 mg  10 mg Oral Daily Karie Mainland, Amjad, PA-C   10 mg at 12/12/22 0920   apixaban (ELIQUIS) tablet 5 mg  5 mg Oral BID Marita Kansas, PA-C   5 mg at 12/12/22 0920   carvedilol (COREG) tablet 6.25 mg  6.25 mg Oral BID WC Karie Mainland, Amjad, PA-C   6.25 mg at 12/12/22 0920   sertraline (ZOLOFT) tablet 50 mg  50 mg Oral Daily Marita Kansas, PA-C   50 mg at 12/12/22 6045   Current Outpatient Medications  Medication Sig Dispense Refill   acetaminophen (TYLENOL) 500 MG tablet Take 1,000 mg by mouth every 6 (six) hours as needed for moderate pain.     Amantadine HCl 100 MG tablet Take 100 mg by mouth 2 (two) times daily.     amLODipine (NORVASC) 10 MG tablet Take 1 tablet (10 mg total) by mouth daily. 90 tablet 3   apixaban (ELIQUIS) 5 MG TABS tablet Take 1 tablet (5 mg total) by mouth 2 (two) times daily. 60 tablet 1   ascorbic acid (VITAMIN C) 500 MG tablet Take 500 mg by mouth 2 (two) times daily.     b complex  vitamins capsule Take 1 capsule by mouth daily. For Covid     baclofen (LIORESAL) 10 MG tablet Take 15 mg by mouth 2 (two) times daily.     carvedilol (COREG) 6.25 MG tablet Take 1 tablet (6.25 mg total) by mouth 2 (two) times daily with a meal. 180 tablet 3   cholecalciferol (VITAMIN D3) 25 MCG (1000 UNIT) tablet Take 1,000 Units by mouth daily.     famotidine (PEPCID) 20 MG tablet Take 1 tablet (20 mg total) by mouth 2 (two) times daily. 60 tablet 0   gabapentin (NEURONTIN) 100 MG capsule Take 1 capsule (100 mg total) by mouth 2 (two) times daily. 60 capsule 0   hydrALAZINE (APRESOLINE) 25 MG tablet Take 25 mg by mouth every 6 (six) hours.     losartan (COZAAR) 25 MG tablet Take 1 tablet (25 mg total) by mouth daily. 90 tablet 3   magnesium gluconate (MAGONATE) 500 MG tablet Take 1 tablet (500 mg total) by mouth at bedtime. 30 tablet 0   mirtazapine (REMERON) 7.5 MG tablet Take 7.5 mg by mouth at bedtime.     Multiple Vitamin (MULTIVITAMIN WITH MINERALS) TABS tablet Take 1 tablet by mouth daily. 30 tablet 0   Multiple Vitamins-Minerals (ZINC PO) Take 1 tablet by mouth daily.     Nutritional Supplements (ADULT NUTRITIONAL SUPPLEMENT PO) Take 1 Dose by mouth in the morning and at bedtime. [employer] shake     ondansetron (ZOFRAN) 4 MG tablet Take 1 tablet (4 mg total) by mouth every 8 (eight) hours as needed for nausea or vomiting. 12 tablet 0   QUEtiapine (SEROQUEL) 50 MG tablet Take 1 tablet (50 mg total) by mouth at bedtime. 14 tablet 0   saccharomyces boulardii (FLORASTOR) 250 MG capsule Take 250 mg  by mouth daily.     sertraline (ZOLOFT) 50 MG tablet Take 1 tablet (50 mg total) by mouth at bedtime. 90 tablet 3   busPIRone (BUSPAR) 5 MG tablet Take 1 tablet (5 mg total) by mouth 3 (three) times daily as needed. (Patient not taking: Reported on 12/12/2022) 90 tablet 3   cephALEXin (KEFLEX) 500 MG capsule Take 1 capsule (500 mg total) by mouth every 12 (twelve) hours. (Patient not  taking: Reported on 12/12/2022) 4 capsule 0   hydrOXYzine (ATARAX) 10 MG tablet Take 1 tablet (10 mg total) by mouth 3 (three) times daily as needed. (Patient not taking: Reported on 12/12/2022) 30 tablet 0   Psychiatric Specialty Exam: Presentation  General Appearance:  Appropriate for Environment  Eye Contact: Good  Speech: Clear and Coherent  Speech Volume: Normal  Handedness:No data recorded  Mood and Affect  Mood: Euthymic  Affect: Congruent   Thought Process  Thought Processes: Coherent  Descriptions of Associations:Intact  Orientation:Full (Time, Place and Person)  Thought Content:Logical  History of Schizophrenia/Schizoaffective disorder:No data recorded Duration of Psychotic Symptoms:No data recorded Hallucinations:Hallucinations: None  Ideas of Reference:None  Suicidal Thoughts:Suicidal Thoughts: No  Homicidal Thoughts:Homicidal Thoughts: No   Sensorium  Memory: Immediate Fair; Recent Fair  Judgment: Fair  Insight: Fair   Art therapist  Concentration: Good  Attention Span: Good  Recall: Good  Fund of Knowledge: Good  Language: Good   Psychomotor Activity  Psychomotor Activity: Psychomotor Activity: -- (currently in rehabilitation for mobility)   Assets  Assets: Resilience; Social Support; Desire for Improvement; Communication Skills    Sleep  Sleep: Sleep: Good   Physical Exam: Physical Exam Neurological:     Mental Status: She is alert and oriented to person, place, and time.  Psychiatric:        Attention and Perception: Attention normal.        Mood and Affect: Mood normal.        Speech: Speech normal.        Behavior: Behavior is cooperative.        Thought Content: Thought content normal.    Review of Systems  All other systems reviewed and are negative.  Blood pressure (!) 177/148, pulse 83, temperature 98.8 F (37.1 C), temperature source Oral, resp. rate (!) 26, SpO2 99 %. There is no  height or weight on file to calculate BMI.  Medical Decision Making: Pt case reviewed and discussed with Dr. Lucianne Muss. Pt does not meet criteria for inpatient psychiatric treatment. Spoke with patient and daughter about starting trial of Seroquel, and the need for OP follow up. Pt received one time dose of Seroquel 25mg  po to monitor for any adverse effects/reactions. Pt is psychiatrically cleared to return to [facility].   Problem 1: hallucinations - Serouquel 50 mg po Qhs  - Resources in AVS include OP follow up, therapy, mental health crisis  Disposition: No evidence of imminent risk to self or others at present.   Patient does not meet criteria for psychiatric inpatient admission. Supportive therapy provided about ongoing stressors. Discussed crisis plan, support from social network, calling 911, coming to the Emergency Department, and calling Suicide Hotline. Psych cleared.  [facility], NP 12/12/2022 11:38 AM

## 2022-12-12 NOTE — Discharge Instructions (Signed)

## 2023-02-05 ENCOUNTER — Emergency Department (HOSPITAL_COMMUNITY)
Admission: EM | Admit: 2023-02-05 | Discharge: 2023-02-06 | Disposition: A | Discharge status: Home / Self Care | Payer: Medicaid Other | Attending: Emergency Medicine | Admitting: Emergency Medicine

## 2023-02-05 ENCOUNTER — Emergency Department (HOSPITAL_COMMUNITY): Payer: Medicaid Other

## 2023-02-05 DIAGNOSIS — Z8673 Personal history of transient ischemic attack (TIA), and cerebral infarction without residual deficits: Secondary | ICD-10-CM | POA: Insufficient documentation

## 2023-02-05 DIAGNOSIS — R930 Abnormal findings on diagnostic imaging of skull and head, not elsewhere classified: Secondary | ICD-10-CM | POA: Diagnosis not present

## 2023-02-05 DIAGNOSIS — R109 Unspecified abdominal pain: Secondary | ICD-10-CM | POA: Diagnosis not present

## 2023-02-05 DIAGNOSIS — Z79899 Other long term (current) drug therapy: Secondary | ICD-10-CM | POA: Insufficient documentation

## 2023-02-05 DIAGNOSIS — I10 Essential (primary) hypertension: Secondary | ICD-10-CM | POA: Diagnosis not present

## 2023-02-05 DIAGNOSIS — Z7901 Long term (current) use of anticoagulants: Secondary | ICD-10-CM | POA: Insufficient documentation

## 2023-02-05 LAB — CBC WITH DIFFERENTIAL/PLATELET
Abs Immature Granulocytes: 0.02 10*3/uL (ref 0.00–0.07)
Basophils Absolute: 0 10*3/uL (ref 0.0–0.1)
Basophils Relative: 1 %
Eosinophils Absolute: 0 10*3/uL (ref 0.0–0.5)
Eosinophils Relative: 0 %
HCT: 37.7 % (ref 36.0–46.0)
Hemoglobin: 12 g/dL (ref 12.0–15.0)
Immature Granulocytes: 0 %
Lymphocytes Relative: 24 %
Lymphs Abs: 1.4 10*3/uL (ref 0.7–4.0)
MCH: 27.3 pg (ref 26.0–34.0)
MCHC: 31.8 g/dL (ref 30.0–36.0)
MCV: 85.9 fL (ref 80.0–100.0)
Monocytes Absolute: 0.3 10*3/uL (ref 0.1–1.0)
Monocytes Relative: 6 %
Neutro Abs: 4.1 10*3/uL (ref 1.7–7.7)
Neutrophils Relative %: 69 %
Platelets: 207 10*3/uL (ref 150–400)
RBC: 4.39 MIL/uL (ref 3.87–5.11)
RDW: 13.2 % (ref 11.5–15.5)
WBC: 5.9 10*3/uL (ref 4.0–10.5)
nRBC: 0 % (ref 0.0–0.2)

## 2023-02-05 LAB — URINALYSIS, MICROSCOPIC (REFLEX): Bacteria, UA: NONE SEEN

## 2023-02-05 LAB — COMPREHENSIVE METABOLIC PANEL
ALT: 14 U/L (ref 0–44)
AST: 17 U/L (ref 15–41)
Albumin: 3.7 g/dL (ref 3.5–5.0)
Alkaline Phosphatase: 87 U/L (ref 38–126)
Anion gap: 9 (ref 5–15)
BUN: 16 mg/dL (ref 6–20)
CO2: 24 mmol/L (ref 22–32)
Calcium: 10.8 mg/dL — ABNORMAL HIGH (ref 8.9–10.3)
Chloride: 106 mmol/L (ref 98–111)
Creatinine, Ser: 0.85 mg/dL (ref 0.44–1.00)
GFR, Estimated: >60 mL/min (ref >60)
Glucose, Bld: 96 mg/dL (ref 70–99)
Potassium: 3.5 mmol/L (ref 3.5–5.1)
Sodium: 139 mmol/L (ref 135–145)
Total Bilirubin: 0.9 mg/dL (ref 0.3–1.2)
Total Protein: 7.3 g/dL (ref 6.5–8.1)

## 2023-02-05 LAB — URINALYSIS, ROUTINE W REFLEX MICROSCOPIC
Bilirubin Urine: NEGATIVE
Glucose, UA: NEGATIVE mg/dL
Hgb urine dipstick: NEGATIVE
Ketones, ur: 15 mg/dL — AB
Leukocytes,Ua: NEGATIVE
Nitrite: NEGATIVE
Protein, ur: 30 mg/dL — AB
Specific Gravity, Urine: 1.025 (ref 1.005–1.030)
pH: 6 (ref 5.0–8.0)

## 2023-02-05 LAB — LACTIC ACID, PLASMA: Lactic Acid, Venous: 0.8 mmol/L (ref 0.5–1.9)

## 2023-02-05 LAB — TSH: TSH: 2.788 u[IU]/mL (ref 0.350–4.500)

## 2023-02-05 MED ORDER — LACTATED RINGERS IV BOLUS
1000.0000 mL | Freq: Once | INTRAVENOUS | Status: AC
  Administered 2023-02-05: 1000 mL via INTRAVENOUS

## 2023-02-05 MED ORDER — LORAZEPAM 2 MG/ML IJ SOLN
1.0000 mg | Freq: Once | INTRAMUSCULAR | Status: AC
  Administered 2023-02-05: 1 mg via INTRAMUSCULAR
  Filled 2023-02-05: qty 1

## 2023-02-05 MED ORDER — HALOPERIDOL LACTATE 5 MG/ML IJ SOLN
5.0000 mg | Freq: Once | INTRAMUSCULAR | Status: AC
  Administered 2023-02-05: 5 mg via INTRAMUSCULAR
  Filled 2023-02-05: qty 1

## 2023-02-05 NOTE — Discharge Instructions (Signed)
Blood work, urine, scan of head and abdomen without any findings today to suggest why she would be having pain.  She did have a nodule in her lung that needs a repeat CAT scan in 3 months.  She was given medication for agitation and may be sleepy for the next 12 hours.

## 2023-02-05 NOTE — ED Notes (Signed)
[phone] pt daughter would like a update

## 2023-02-05 NOTE — ED Provider Notes (Signed)
[place] Provider Note   CSN: [id number] Arrival date & time: 02/05/23  1529     History {Add pertinent medical, surgical, social history, OB history to HPI:1} Chief Complaint  Patient presents with   Abdominal Pain    Sarah Myers is a 52 y.o. female.  Patient is a 52 year old female with a history of stroke with left-sided deficits, hypertension, currently living in a skilled facility with ongoing hallucinations per her daughter at the facility who is presenting today due to sudden onset of abdominal pain around 1:00 today.  Patient's daughter reports it has not been a great day for her.  She has been very anxious today and wanting to be with her children.  However she also notes that the patient did not take her medications today and has not eaten.  She does not know if she has had a fever and denies that she has had any vomiting.  Last bowel movement was today and it was normal.  She has not had a cough, shortness of breath or congestion.  The history is provided by the patient.  Abdominal Pain      Home Medications Prior to Admission medications   Medication Sig Start Date End Date Taking? Authorizing Provider  acetaminophen (TYLENOL) 500 MG tablet Take 1,000 mg by mouth every 6 (six) hours as needed for moderate pain.    [provider]  Amantadine HCl 100 MG tablet Take 100 mg by mouth 2 (two) times daily. 09/15/22   [provider]  amLODipine (NORVASC) 10 MG tablet Take 1 tablet (10 mg total) by mouth daily. 06/22/22   Raulkar, Clide Deutscher, MD  apixaban (ELIQUIS) 5 MG TABS tablet Take 1 tablet (5 mg total) by mouth 2 (two) times daily. 06/22/22   Raulkar, Clide Deutscher, MD  ascorbic acid (VITAMIN C) 500 MG tablet Take 500 mg by mouth 2 (two) times daily.    [provider]  b complex vitamins capsule Take 1 capsule by mouth daily. For Covid    [provider]  baclofen (LIORESAL) 10 MG tablet  Take 15 mg by mouth 2 (two) times daily. 06/13/22   [provider]  busPIRone (BUSPAR) 5 MG tablet Take 1 tablet (5 mg total) by mouth 3 (three) times daily as needed. Patient not taking: Reported on 12/12/2022 06/23/22   Izora Ribas, MD  carvedilol (COREG) 6.25 MG tablet Take 1 tablet (6.25 mg total) by mouth 2 (two) times daily with a meal. 06/22/22   Raulkar, Clide Deutscher, MD  cephALEXin (KEFLEX) 500 MG capsule Take 1 capsule (500 mg total) by mouth every 12 (twelve) hours. Patient not taking: Reported on 12/12/2022 12/01/22   Edwin Dada, MD  cholecalciferol (VITAMIN D3) 25 MCG (1000 UNIT) tablet Take 1,000 Units by mouth daily.    [provider]  famotidine (PEPCID) 20 MG tablet Take 1 tablet (20 mg total) by mouth 2 (two) times daily. 05/22/22   Raulkar, Clide Deutscher, MD  gabapentin (NEURONTIN) 100 MG capsule Take 1 capsule (100 mg total) by mouth 2 (two) times daily. 05/24/22   Raulkar, Clide Deutscher, MD  hydrALAZINE (APRESOLINE) 25 MG tablet Take 25 mg by mouth every 6 (six) hours. 10/10/22   [provider]  hydrOXYzine (ATARAX) 10 MG tablet Take 1 tablet (10 mg total) by mouth 3 (three) times daily as needed. Patient not taking: Reported on 12/12/2022 06/09/22   Izora Ribas, MD  losartan (COZAAR) 25  MG tablet Take 1 tablet (25 mg total) by mouth daily. 06/09/22   Raulkar, Clide Deutscher, MD  magnesium gluconate (MAGONATE) 500 MG tablet Take 1 tablet (500 mg total) by mouth at bedtime. 05/22/22   Raulkar, Clide Deutscher, MD  mirtazapine (REMERON) 7.5 MG tablet Take 7.5 mg by mouth at bedtime.    [provider]  Multiple Vitamin (MULTIVITAMIN WITH MINERALS) TABS tablet Take 1 tablet by mouth daily. 05/22/22   Raulkar, Clide Deutscher, MD  Multiple Vitamins-Minerals (ZINC PO) Take 1 tablet by mouth daily.    [provider]  Nutritional Supplements (ADULT NUTRITIONAL SUPPLEMENT PO) Take 1 Dose by mouth in the morning and at bedtime. [employer] shake     [provider]  ondansetron (ZOFRAN) 4 MG tablet Take 1 tablet (4 mg total) by mouth every 8 (eight) hours as needed for nausea or vomiting. 10/17/22   Tegeler, Gwenyth Allegra, MD  QUEtiapine (SEROQUEL) 50 MG tablet Take 1 tablet (50 mg total) by mouth at bedtime. 12/12/22   Vesta Mixer, NP  saccharomyces boulardii (FLORASTOR) 250 MG capsule Take 250 mg by mouth daily.    [provider]  sertraline (ZOLOFT) 50 MG tablet Take 1 tablet (50 mg total) by mouth at bedtime. 06/22/22   Raulkar, Clide Deutscher, MD      Allergies    Lisinopril    Review of Systems   Review of Systems  Gastrointestinal:  Positive for abdominal pain.    Physical Exam Updated Vital Signs SpO2 98%  Physical Exam Vitals and nursing note reviewed.  Constitutional:      General: She is not in acute distress.    Appearance: She is well-developed.  HENT:     Head: Normocephalic and atraumatic.  Eyes:     Pupils: Pupils are equal, round, and reactive to light.  Cardiovascular:     Rate and Rhythm: Normal rate and regular rhythm.     Heart sounds: Normal heart sounds. No murmur heard.    No friction rub.  Pulmonary:     Effort: Pulmonary effort is normal.     Breath sounds: Normal breath sounds. No wheezing or rales.  Abdominal:     General: Bowel sounds are normal. There is no distension.     Palpations: Abdomen is soft.     Tenderness: There is no abdominal tenderness. There is no guarding or rebound.     Comments: Abdomen is soft without any focal areas of tenderness but patient reports pain when pushing diffusely through the abdomen  Musculoskeletal:        General: No tenderness. Normal range of motion.     Comments: No edema  Skin:    General: Skin is warm and dry.     Findings: No rash.  Neurological:     Mental Status: She is alert.     Cranial Nerves: No cranial nerve deficit.     Comments: Patient is agitated.  Oriented to self and place.  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  Procedures Procedures  {Document cardiac monitor, telemetry assessment procedure when appropriate:1}  Medications Ordered in ED Medications  LORazepam (ATIVAN) injection 1 mg (has no administration in time range)  haloperidol lactate (HALDOL) injection 5 mg (has no administration in time range)  lactated ringers bolus 1,000 mL (has no administration in time range)    ED Course/ Medical Decision Making/ A&P   {   Click here for ABCD2, HEART and other calculatorsREFRESH Note before signing :1}                          Medical Decision Making Amount and/or Complexity of Data Reviewed Labs: ordered.  Risk Prescription drug management.   Pt with multiple medical problems and comorbidities and presenting today with a complaint that caries a high risk for morbidity and mortality.  Here today with abdominal pain.  This started suddenly.  However it was noted that patient has refused her medications and not eaten today.  Patient's daughter is present and provides most of the history as well as EMS.  Patient is not cooperative on exam and mildly agitated.  Her daughter feels that she will need something for agitation so that a workup can be done.  Concern for infectious etiology such as UTI versus kidney stone versus appendicitis or diverticulitis.  Low suspicion for new stroke or acute lung pathology.  Patient was given Ativan and Haldol.  She  has come to the ER before and required this for sedation due to agitation and being combative.   {Document critical care time when appropriate:1} {Document review of labs and clinical decision tools ie heart score, Chads2Vasc2 etc:1}  {Document your independent review of radiology images, and any outside records:1} {Document your discussion with family members, caretakers, and with consultants:1} {Document social determinants of health affecting pt's care:1} {Document your decision making why or why not admission, treatments were needed:1} Final Clinical Impression(s) / ED Diagnoses Final diagnoses:  None    Rx / DC Orders ED Discharge Orders     None

## 2023-02-05 NOTE — ED Provider Notes (Incomplete)
[facility] Provider Note   CSN: [id number] Arrival date & time: 02/05/23  1529     History {Add pertinent medical, surgical, social history, OB history to HPI:1} Chief Complaint  Patient presents with  . Abdominal Pain    Sarah Myers is a 52 y.o. female.  Patient is a 52 year old female with a history of stroke with left-sided deficits, hypertension, currently living in a skilled facility with ongoing hallucinations per her daughter at the facility who is presenting today due to sudden onset of abdominal pain around 1:00 today.  Patient's daughter reports it has not been a great day for her.  She has been very anxious today and wanting to be with her children.  However she also notes that the patient did not take her medications today and has not eaten.  She does not know if she has had a fever and denies that she has had any vomiting.  Last bowel movement was today and it was normal.  She has not had a cough, shortness of breath or congestion.  The history is provided by the patient.  Abdominal Pain      Home Medications Prior to Admission medications   Medication Sig Start Date End Date Taking? Authorizing Provider  acetaminophen (TYLENOL) 500 MG tablet Take 1,000 mg by mouth every 6 (six) hours as needed for moderate pain.    [provider]  Amantadine HCl 100 MG tablet Take 100 mg by mouth 2 (two) times daily. 09/15/22   [provider]  amLODipine (NORVASC) 10 MG tablet Take 1 tablet (10 mg total) by mouth daily. 06/22/22   Raulkar, Clide Deutscher, MD  apixaban (ELIQUIS) 5 MG TABS tablet Take 1 tablet (5 mg total) by mouth 2 (two) times daily. 06/22/22   Raulkar, Clide Deutscher, MD  ascorbic acid (VITAMIN C) 500 MG tablet Take 500 mg by mouth 2 (two) times daily.    [provider]  b complex vitamins capsule Take 1 capsule by mouth daily. For Covid    [provider]  baclofen (LIORESAL) 10 MG tablet  Take 15 mg by mouth 2 (two) times daily. 06/13/22   [provider]  busPIRone (BUSPAR) 5 MG tablet Take 1 tablet (5 mg total) by mouth 3 (three) times daily as needed. Patient not taking: Reported on 12/12/2022 06/23/22   Izora Ribas, MD  carvedilol (COREG) 6.25 MG tablet Take 1 tablet (6.25 mg total) by mouth 2 (two) times daily with a meal. 06/22/22   Raulkar, Clide Deutscher, MD  cephALEXin (KEFLEX) 500 MG capsule Take 1 capsule (500 mg total) by mouth every 12 (twelve) hours. Patient not taking: Reported on 12/12/2022 12/01/22   Edwin Dada, MD  cholecalciferol (VITAMIN D3) 25 MCG (1000 UNIT) tablet Take 1,000 Units by mouth daily.    [provider]  famotidine (PEPCID) 20 MG tablet Take 1 tablet (20 mg total) by mouth 2 (two) times daily. 05/22/22   Raulkar, Clide Deutscher, MD  gabapentin (NEURONTIN) 100 MG capsule Take 1 capsule (100 mg total) by mouth 2 (two) times daily. 05/24/22   Raulkar, Clide Deutscher, MD  hydrALAZINE (APRESOLINE) 25 MG tablet Take 25 mg by mouth every 6 (six) hours. 10/10/22   [provider]  hydrOXYzine (ATARAX) 10 MG tablet Take 1 tablet (10 mg total) by mouth 3 (three) times daily as needed. Patient not taking: Reported on 12/12/2022 06/09/22   Izora Ribas, MD  losartan (COZAAR) 25  MG tablet Take 1 tablet (25 mg total) by mouth daily. 06/09/22   Raulkar, Clide Deutscher, MD  magnesium gluconate (MAGONATE) 500 MG tablet Take 1 tablet (500 mg total) by mouth at bedtime. 05/22/22   Raulkar, Clide Deutscher, MD  mirtazapine (REMERON) 7.5 MG tablet Take 7.5 mg by mouth at bedtime.    [provider]  Multiple Vitamin (MULTIVITAMIN WITH MINERALS) TABS tablet Take 1 tablet by mouth daily. 05/22/22   Raulkar, Clide Deutscher, MD  Multiple Vitamins-Minerals (ZINC PO) Take 1 tablet by mouth daily.    [provider]  Nutritional Supplements (ADULT NUTRITIONAL SUPPLEMENT PO) Take 1 Dose by mouth in the morning and at bedtime. [employer] shake     [provider]  ondansetron (ZOFRAN) 4 MG tablet Take 1 tablet (4 mg total) by mouth every 8 (eight) hours as needed for nausea or vomiting. 10/17/22   Tegeler, Gwenyth Allegra, MD  QUEtiapine (SEROQUEL) 50 MG tablet Take 1 tablet (50 mg total) by mouth at bedtime. 12/12/22   Vesta Mixer, NP  saccharomyces boulardii (FLORASTOR) 250 MG capsule Take 250 mg by mouth daily.    [provider]  sertraline (ZOLOFT) 50 MG tablet Take 1 tablet (50 mg total) by mouth at bedtime. 06/22/22   Raulkar, Clide Deutscher, MD      Allergies    Lisinopril    Review of Systems   Review of Systems  Gastrointestinal:  Positive for abdominal pain.    Physical Exam Updated Vital Signs BP (!) 156/96   Pulse 62   Temp 98 F (36.7 C)   Resp 19   SpO2 100%  Physical Exam Vitals and nursing note reviewed.  Constitutional:      General: She is not in acute distress.    Appearance: She is well-developed.  HENT:     Head: Normocephalic and atraumatic.  Eyes:     Pupils: Pupils are equal, round, and reactive to light.  Cardiovascular:     Rate and Rhythm: Normal rate and regular rhythm.     Heart sounds: Normal heart sounds. No murmur heard.    No friction rub.  Pulmonary:     Effort: Pulmonary effort is normal.     Breath sounds: Normal breath sounds. No wheezing or rales.  Abdominal:     General: Bowel sounds are normal. There is no distension.     Palpations: Abdomen is soft.     Tenderness: There is no abdominal tenderness. There is no guarding or rebound.     Comments: Abdomen is soft without any focal areas of tenderness but patient reports pain when pushing diffusely through the abdomen  Musculoskeletal:        General: No tenderness. Normal range of motion.     Comments: No edema  Skin:    General: Skin is warm and dry.     Findings: No rash.  Neurological:     Mental Status: She is alert.     Cranial Nerves: No cranial nerve deficit.     Comments: Patient is agitated.   Oriented to self and place.  Left-sided paralysis with contractures in the left upper and lower extremity, neglect on the left side  Psychiatric:     Comments: Patient is agitated, talking to herself, repeatedly stating that she is going to die, occasionally responding to internal stimuli     ED Results / Procedures / Treatments   Labs (all labs ordered are listed, but only abnormal results are displayed) Labs Reviewed  URINALYSIS, ROUTINE W REFLEX MICROSCOPIC - Abnormal; Notable for the following components:      Result Value   Ketones, ur 15 (*)    Protein, ur 30 (*)    All other components within normal limits  COMPREHENSIVE METABOLIC PANEL - Abnormal; Notable for the following components:   Calcium 10.8 (*)    All other components within normal limits  CBC WITH DIFFERENTIAL/PLATELET  LACTIC ACID, PLASMA  TSH  URINALYSIS, MICROSCOPIC (REFLEX)  LACTIC ACID, PLASMA    EKG EKG Interpretation  Date/Time:  Monday February 05 2023 16:18:59 EST Ventricular Rate:  82 PR Interval:  141 QRS Duration: 108 QT Interval:  390 QTC Calculation: 456 R Axis:   109 Text Interpretation: Sinus rhythm Probable anteroseptal infarct, recent Lateral leads are also involved No significant change since last tracing Confirmed by Blanchie Dessert [phone]) on 02/05/2023 5:20:17 PM  Radiology CT Head Wo Contrast  Result Date: 02/05/2023 CLINICAL DATA:  Altered mental status EXAM: CT HEAD WITHOUT CONTRAST TECHNIQUE: Contiguous axial images were obtained from the base of the skull through the vertex without intravenous contrast. RADIATION DOSE REDUCTION: This exam was performed according to the departmental dose-optimization program which includes automated exposure control, adjustment of the mA and/or kV according to patient size and/or use of iterative reconstruction technique. COMPARISON:  None Available. FINDINGS: Brain: There is no mass, hemorrhage or extra-axial collection. The size and configuration  of the ventricles and extra-axial CSF spaces are normal. There is hypoattenuation of the white matter, most commonly indicating chronic small vessel disease. Old left PCA territory infarct. Vascular: No abnormal hyperdensity of the major intracranial arteries or dural venous sinuses. No intracranial atherosclerosis. Skull: The visualized skull base, calvarium and extracranial soft tissues are normal. 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 the orbits are normal. IMPRESSION: 1. No acute intracranial abnormality. 2. Old left PCA territory infarct and findings of chronic small vessel disease. Electronically Signed   By: Ulyses Jarred M.D.   On: 02/05/2023 22:43   CT ABDOMEN PELVIS WO CONTRAST  Result Date: 02/05/2023 CLINICAL DATA:  Acute abdominal pain EXAM: CT ABDOMEN AND PELVIS WITHOUT CONTRAST TECHNIQUE: Multidetector CT imaging of the abdomen and pelvis was performed following the standard protocol without IV contrast. RADIATION DOSE REDUCTION: This exam was performed according to the departmental dose-optimization program which includes automated exposure control, adjustment of the mA and/or kV according to patient size and/or use of iterative reconstruction technique. COMPARISON:  10/17/2022 FINDINGS: Lower chest: Lung base demonstrates some persistent scarring with and associated nodular component measuring up to 9 mm. This is slightly more prominent than that seen on the prior exam. Hepatobiliary: Liver is within normal limits. Gallbladder is well distended with a dependent gallstone. No complicating factors are seen. Pancreas: Unremarkable. No pancreatic ductal dilatation or surrounding inflammatory changes. Spleen: Normal in size without focal abnormality. Adrenals/Urinary Tract: Adrenal glands are within normal limits. No renal calculi or obstructive changes are seen. The bladder is partially distended. Stomach/Bowel: Fecal material is noted scattered  throughout the colon. No obstructive or inflammatory changes of the colon are seen. The appendix is within normal limits. Inflammatory changes are seen. The small bowel and stomach are unremarkable. Vascular/Lymphatic: Aortic atherosclerosis. No enlarged abdominal or pelvic lymph nodes. Reproductive: Uterus and bilateral adnexa are unremarkable. Other: No abdominal wall hernia or abnormality. No abdominopelvic ascites. Musculoskeletal: Degenerative changes of lumbar spine are noted. IMPRESSION: No acute abnormality is identified correspond with the given clinical history of abdominal pain. Cholelithiasis without  complicating factors. More nodular component to left lower lobe scarring measuring up to 9 mm. Consider one of the following in 3 months for both low-risk and high-risk individuals: (a) repeat chest CT, (b) follow-up PET-CT, or (c) tissue sampling. This recommendation follows the consensus statement: Guidelines for Management of Incidental Pulmonary Nodules Detected on CT Images: From the Fleischner Society 2017; Radiology 2017; 284:228-243. Electronically Signed   By: Inez Catalina M.D.   On: 02/05/2023 22:37    Procedures Procedures  {Document cardiac monitor, telemetry assessment procedure when appropriate:1}  Medications Ordered in ED Medications  LORazepam (ATIVAN) injection 1 mg (1 mg Intramuscular Given 02/05/23 1704)  haloperidol lactate (HALDOL) injection 5 mg (5 mg Intramuscular Given 02/05/23 1704)  lactated ringers bolus 1,000 mL (1,000 mLs Intravenous New Bag/Given 02/05/23 1842)    ED Course/ Medical Decision Making/ A&P   {   Click here for ABCD2, HEART and other calculatorsREFRESH Note before signing :1}                          Medical Decision Making Amount and/or Complexity of Data Reviewed Independent Historian: EMS    Details: daughter External Data Reviewed: notes. Labs: ordered. Decision-making details documented in ED Course. Radiology: ordered and independent  interpretation performed. Decision-making details documented in ED Course. ECG/medicine tests: ordered and independent interpretation performed. Decision-making details documented in ED Course.  Risk Prescription drug management.   Pt with multiple medical problems and comorbidities and presenting today with a complaint that caries a high risk for morbidity and mortality.  Here today with abdominal pain.  This started suddenly.  However it was noted that patient has refused her medications and not eaten today.  Patient's daughter is present and provides most of the history as well as EMS.  Patient is not cooperative on exam and mildly agitated.  Her daughter feels that she will need something for agitation so that a workup can be done.  Concern for infectious etiology such as UTI versus kidney stone versus appendicitis or diverticulitis.  Low suspicion for new stroke or acute lung pathology.  Patient was given Ativan and Haldol.  She has come to the ER before and required this for sedation due to agitation and being combative.  11:42 PM I independently interpreted patient's labs and EKG.  CBC, lactic acid, CMP, TSH and UA all without acute findings.  I have independently visualized and interpreted pt's images today. Head CT and abdominal images are negative for acute findings.  Radiology does report that patient has a nodule in the left lower lobe that needs a 66-monthfollow-up with a repeat chest CT.  This is an incidental finding.  Head CT showing prior stroke but no other acute findings.  After medications patient is less agitated.  At this time no indication for admission.  Vital signs have been normal.  Feel that patient is stable to be discharged back.  She would most likely benefit from mental health evaluation and resources.  She had been started on Seroquel in December for hallucinations and other issues.  No underlying findings today to cause delirium.   {Document critical care time when  appropriate:1} {Document review of labs and clinical decision tools ie heart score, Chads2Vasc2 etc:1}  {Document your independent review of radiology images, and any outside records:1} {Document your discussion with family members, caretakers, and with consultants:1} {Document social determinants of health affecting pt's care:1} {Document your decision making why or why not admission,  treatments were needed:1} Final Clinical Impression(s) / ED Diagnoses Final diagnoses:  None    Rx / DC Orders ED Discharge Orders     None

## 2023-02-05 NOTE — ED Triage Notes (Signed)
Pt BIB [facility] from [facility] for abdominal pain starting an hour ago, facility reports patient was doubled over in pain with left lower abd pain radiating to right lower abd. Pain with palpation according to EMS with no rebound tenderness. VSS, hallucinations at baseline.

## 2023-02-05 NOTE — ED Notes (Signed)
Patient observed to be agitated and uncooperative. Patient observed to be paranoid and delusional, not recognizing her daughter, swatting at her daughter, stating that people are trying to kill her, obsessively talking about her death and her husband who has passed away. Patient observed to be tearful, curling up in the fetal position, acting scared of staff and occasionally family. Patient is difficult to orient to reality and situation.

## 2023-02-06 MED ORDER — LABETALOL HCL 5 MG/ML IV SOLN
5.0000 mg | Freq: Once | INTRAVENOUS | Status: AC
  Administered 2023-02-06: 5 mg via INTRAVENOUS
  Filled 2023-02-06: qty 4

## 2023-02-06 NOTE — ED Notes (Signed)
Attempted to call report to the [facility] x3. No answer or call returned.

## 2023-02-06 NOTE — ED Notes (Signed)
Ps daughter updated.

## 2023-02-20 ENCOUNTER — Encounter
Payer: Medicaid Other | Attending: Physical Medicine and Rehabilitation | Admitting: Physical Medicine and Rehabilitation

## 2023-02-20 DIAGNOSIS — Z789 Other specified health status: Secondary | ICD-10-CM

## 2023-02-20 DIAGNOSIS — Z7409 Other reduced mobility: Secondary | ICD-10-CM | POA: Diagnosis not present

## 2023-02-20 DIAGNOSIS — F411 Generalized anxiety disorder: Secondary | ICD-10-CM | POA: Insufficient documentation

## 2023-02-20 DIAGNOSIS — G4701 Insomnia due to medical condition: Secondary | ICD-10-CM | POA: Diagnosis not present

## 2023-02-20 DIAGNOSIS — R252 Cramp and spasm: Secondary | ICD-10-CM | POA: Diagnosis not present

## 2023-02-20 DIAGNOSIS — I10 Essential (primary) hypertension: Secondary | ICD-10-CM | POA: Diagnosis not present

## 2023-02-20 MED ORDER — SODIUM CHLORIDE (PF) 0.9 % IJ SOLN
4.0000 mL | Freq: Once | INTRAMUSCULAR | Status: AC
  Administered 2023-02-20: 4 mL via INTRAVENOUS

## 2023-02-20 MED ORDER — ONABOTULINUMTOXINA 100 UNITS IJ SOLR
400.0000 [IU] | Freq: Once | INTRAMUSCULAR | Status: AC
  Administered 2023-02-20: 400 [IU] via INTRAMUSCULAR

## 2023-02-20 NOTE — Progress Notes (Signed)
Botox Injection for spasticity using US guidance   Indication: Severe spasticity which interferes with ADL,mobility and/or  hygiene and is unresponsive to medication management and other conservative care Informed consent was obtained after describing risks and benefits of the procedure with the patient. This includes bleeding, bruising, infection, excessive weakness, or medication side effects. A REMS form is on file and signed.  Number of units per muscle Biceps 150 FCR: 25 FCU: 25 FDS: 25 FDP: 25 Hamstrings: 150 All injections were done after obtaining appropriate ultrasound visualization and after negative drawback for blood. The patient tolerated the procedure well. Post procedure instructions were given. A followup appointment was made.

## 2023-02-20 NOTE — Progress Notes (Signed)
Subjective:    Patient ID: Sarah Myers, female    DOB: 15-Jul-1971, 52 y.o.   MRN: [id number]  HPI Mrs. Salvetti presents for f/u of insomnia, impaired mobility and ADLs due to stroke, and spasticity  1) Insomnia -sleeping better now -the baclofen helps her to sleep  2) Spasticity -still tight in upper and lower extremities at times -daughter thinks patient developed hallucinations in response to tizanidine and these have resolved since she has stopped tizanidine -she has restarted Baclofen  3) Swelling of all limbs -improved -the DME company no longer provides compression garments  4) Frequent urination -improved -no dysuria  5) Impaired mobility -her daughter would like to be able to bring her to her doctor's appointments but is unable to due to lack of transportation. 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  -she is now in a facility at [facility] -she wants to come home.   6) Hallucinations: -daughter notes these have started after she started taking Tizanidine -has resolved since tizanidine has stopped  7) Anxiety -daughter notes it continues to be severe at times -she has picked up hydroxyzine and will try this, daughter feels this has helped -she is having a lot of anxiety this morning -her daughter gave her hydroxyzine at 5am and asks what she should do now -she is willing to try additional anxiety medication in between hydroxyzine if this is not effective enough -she is willing to speak with a pyschiatrist  8) Impaired memory and cognition -daughter notes impaired memory and cognition and asks if her mom could be getting early dementia, she asks what can be done for this, she asks if any testing is needed  9) Decreased appetite: --her daughter is concerned about her losing  weight  10) Daytime somnolence  11) Depression: her daughter would like for her to see a psychologist for therapy.   12) HTN -elevated today   Review of Systems +swelling of all limbs, +spasticity, +insomnia, +hallucinations    Objective:   Physical Exam Gen: no distress, normal appearing, BM 121/8 HEENT: oral mucosa pink and moist, NCAT Cardio: Reg rate Chest: normal effort, normal rate of breathing Abd: soft, non-distended Ext: no edema Psych: pleasant, normal affect Skin: intact Neuro: Falling asleep in office today but able to state that she is tired, MAS 3 in LUE and LLE       Assessment & Plan:   1) Insomnia -improved -d/c tizanidine and restart baclofen  2) Spasticity -d/c tizanidine and restart baclofen -discussed Botox and daughter will consider, will plan for 400U in LUE and LLE next visit  3) Swelling of limbs: -improved -prescribed extra large compression garments for bilateral lower extremities, small for bilateral upper extremities, discussed that she could purchase these online and send receipt to insurance for reimbursement  4) Impaired mobility and ADLs -continue PT, OT -reached out to SW and emailed patient [employer] forms to complete for transportation -helped daughter to fill out each item on the [employer] forms and asked her to mail forms to our office for [place] to send on her behalf since she does not have a fax machine -discussed every question on [employer] form and guiding daughter on how to answer questions accurately based on her mother's condition. There is a Part B on the form  that needs to be filled by [place], discussed with daughter that she can mail this to our office and I can completed this portion before we fax the form for her, discussed with patient and daughter her major limitations of left sided weakness and how this inhibits her from walking, her inability to navigate the stairs in her apartment which has thus far prevented her from  leaving her apartment, the fact that she does not have a ramp, her difficulty in getting to and from doctor's appointments due to this impaired mobility, the fact that she does not have a wheelchair ramp at home -[place] to call patient to answer some of her questions -provided note stating that daughter has been unable to work due to caring for her mother full time at home  4) Anxiety -continue hydroxyzine prn -prescribed buspar '5mg'$  TID PRN for use in between hydroxyzine if this is not effective enough -referred to psychiatry -discussed eating 2 [place] nuts per day -discussed decreasing Zoloft to '25mg'$  since daughter would prefer to use PRN medication for her -discussed benefits of meditation and exercise -Discussed exercise and meditation as tools to decrease anxiety. -Recommended [employer] Dog [employer] app -Discussed spending time outdoors. -Discussed positive re-framing of anxiety.  -Discussed the following foods that have been show to reduce anxiety: 1) [place] nuts, mushrooms, soy beans due to their high selenium content. Upper limit of toxicity of selenium is 455mg/day so no more than 3-4 b[place]oliv[place]uts per day.  2) Fatty fish such as salmon, mackerel, sardines, trout, and herring- high in omega-3 fatty acids 3) Eggs- increases serotonin and dopamine 4) Pumpkin seeds- high in omega-3 fatty acids 5) dark chocolate- high in flavanols that increase blood flow to brain 6) turmeric- take with black pepper to increase absorption 7) chamomile tea- antioxidant and anti-inflammatory properties 8) yogurt without sugar- supports gut-brain axis 9) green tea- contains L- theanine 10) blueberries- high in vitamin C and antioxidants 11) [place] high in tryptophan which gets converted to serotonin 12) bell peppers- rich in vitamin C and antioxidants 13) citrus fruits- rich in vitamin C and antioxidants 14) almonds- high in vitamin E and healthy fats 15) chia seeds- high in omega-3 fatty  acids -Made goal to ____   5) Polypharmacy -reviewed all medications, their indications, and potential side effects with daughter.   6) GERD -recommended using pepcid PRN  7) HTN -continue Cozaar -drink 6-8 glasses of water per day to protect kidneys while using Cozaar -continue magnesium supplement -discussed excellent BM -Advised checking BP daily at home and logging results to bring into follow-up appointment with PCP and myself. -Reviewed BP meds today.  -Advised regarding healthy foods that can help lower blood pressure and provided with a list: 1) citrus foods- high in vitamins and minerals 2) salmon and other fatty fish - reduces inflammation and oxylipins 3) swiss chard (leafy green)- high level of nitrates 4) pumpkin seeds- one of the best natural sources of magnesium 5) Beans and lentils- high in fiber, magnesium, and potassium 6) Berries- high in flavonoids 7) Amaranth (whole grain, can be cooked similarly to rice and oats)- high in magnesium and fiber 8) Pistachios- even more effective at reducing BP than other nuts 9) Carrots- high in phenolic compounds that relax blood vessels and reduce inflammation 10) Celery- contain phthalides that relax tissues of arterial walls 11) Tomatoes- can also improve cholesterol and reduce risk of heart disease 12) Broccoli- good source of magnesium, calcium, and potassium 13) Greek yogurt: high in  potassium and calcium 14) Herbs and spices: Celery seed, cilantro, saffron, lemongrass, black cumin, ginseng, cinnamon, cardamom, sweet basil, and ginger 15) Chia and flax seeds- also help to lower cholesterol and blood sugar 16) Beets- high levels of nitrates that relax blood vessels  17) spinach and bananas- high in potassium  -Provided lise of supplements that can help with hypertension:  1) magnesium: one high quality brand is Bioptemizers since it contains all 7 types of magnesium, otherwise over the counter magnesium gluconate '400mg'$  is  a good option 2) B vitamins 3) vitamin D 4) potassium 5) CoQ10 6) L-arginine 7) Vitamin C 8) Beetroot -Educated that goal BP is 120/80. -Made goal to incorporate some of the above foods into diet.    8) History of CVA -continue multivitamin  9) Cognitive impairment:  -discussed that cognitive impairment can be secondary to stroke, her baclofen medication -discussed that neurology can help with cognitive testing to assess for dementia -discussed patient's risk for vascular demential given stroke and cardiac history -recommended avoiding processed foods such as added sugar, bread/pasta/rice -discussed that insulin resistance can worsen cognitive impairment -recommended eating fruits, vegetables, lean meats, olive oil, nuts, and yogurt. Highlighted salmon, blueberries and walnuts as foods that enhance cognition  10) Pain from spasticity:  --Provided with a pain relief journal and discussed that it contains foods and lifestyle tips to naturally help to improve pain. Discussed that these lifestyle strategies are also very good for health unlike some medications which can have negative side effects. Discussed that the act of keeping a journal can be therapeutic and helpful to realize patterns what helps to trigger and alleviate pain.   -continue baclofen  11) Depression: -referred to Dr. Sima Matas, neuropsych  12) decreased appetite: -discussed mirtazepine but patient defers at this time

## 2023-04-03 ENCOUNTER — Encounter
Payer: Medicaid Other | Attending: Physical Medicine and Rehabilitation | Admitting: Physical Medicine and Rehabilitation

## 2023-04-03 VITALS — BP 147/99 | HR 62

## 2023-04-03 DIAGNOSIS — R252 Cramp and spasm: Secondary | ICD-10-CM | POA: Insufficient documentation

## 2023-04-03 DIAGNOSIS — I10 Essential (primary) hypertension: Secondary | ICD-10-CM | POA: Insufficient documentation

## 2023-04-03 NOTE — Patient Instructions (Signed)
HTN: -BP is ___today.  -Advised checking BP daily at home and logging results to bring into follow-up appointment with PCP and myself. -Reviewed BP meds today.  -Advised regarding healthy foods that can help lower blood pressure and provided with a list: 1) citrus foods- high in vitamins and minerals 2) salmon and other fatty fish - reduces inflammation and oxylipins 3) swiss chard (leafy green)- high level of nitrates 4) pumpkin seeds- one of the best natural sources of magnesium 5) Beans and lentils- high in fiber, magnesium, and potassium 6) Berries- high in flavonoids 7) Amaranth (whole grain, can be cooked similarly to rice and oats)- high in magnesium and fiber 8) Pistachios- even more effective at reducing BP than other nuts 9) Carrots- high in phenolic compounds that relax blood vessels and reduce inflammation 10) Celery- contain phthalides that relax tissues of arterial walls 11) Tomatoes- can also improve cholesterol and reduce risk of heart disease 12) Broccoli- good source of magnesium, calcium, and potassium 13) Greek yogurt: high in potassium and calcium 14) Herbs and spices: Celery seed, cilantro, saffron, lemongrass, black cumin, ginseng, cinnamon, cardamom, sweet basil, and ginger 15) Chia and flax seeds- also help to lower cholesterol and blood sugar 16) Beets- high levels of nitrates that relax blood vessels  17) spinach and bananas- high in potassium  -Provided lise of supplements that can help with hypertension:  1) magnesium: one high quality brand is Bioptemizers since it contains all 7 types of magnesium, otherwise over the counter magnesium gluconate 400mg  is a good option 2) B vitamins 3) vitamin D 4) potassium 5) CoQ10 6) L-arginine 7) Vitamin C 8) Beetroot -Educated that goal BP is 120/80. -Made goal to incorporate some of the above foods into diet.     Foods that may reduce pain: 1) Ginger (especially studied for arthritis)- reduce leukotriene  production to decrease inflammation 2) Blueberries- high in phytonutrients that decrease inflammation 3) Salmon- marine omega-3s reduce joint swelling and pain 4) Pumpkin seeds- reduce inflammation 5) dark chocolate- reduces inflammation 6) turmeric- reduces inflammation 7) tart cherries - reduce pain and stiffness 8) extra virgin olive oil - its compound olecanthal helps to block prostaglandins  9) chili peppers- can be eaten or applied topically via capsaicin 10) mint- helpful for headache, muscle aches, joint pain, and itching 11) garlic- reduces inflammation  Link to further information on diet for chronic pain: [email or web address]
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
## 2023-04-03 NOTE — Addendum Note (Signed)
Addended by: Horton Chin on: 04/03/2023 02:00 PM   Modules accepted: Level of Service

## 2023-04-25 ENCOUNTER — Emergency Department (HOSPITAL_COMMUNITY)
Admission: EM | Admit: 2023-04-25 | Discharge: 2023-04-25 | Disposition: A | Discharge status: Home / Self Care | Payer: Medicaid Other | Attending: Emergency Medicine | Admitting: Emergency Medicine

## 2023-04-25 ENCOUNTER — Emergency Department (HOSPITAL_COMMUNITY): Payer: Medicaid Other

## 2023-04-25 ENCOUNTER — Other Ambulatory Visit: Payer: Self-pay

## 2023-04-25 DIAGNOSIS — Z7901 Long term (current) use of anticoagulants: Secondary | ICD-10-CM | POA: Diagnosis not present

## 2023-04-25 DIAGNOSIS — M545 Low back pain, unspecified: Secondary | ICD-10-CM | POA: Insufficient documentation

## 2023-04-25 DIAGNOSIS — S0003XA Contusion of scalp, initial encounter: Secondary | ICD-10-CM | POA: Insufficient documentation

## 2023-04-25 DIAGNOSIS — I10 Essential (primary) hypertension: Secondary | ICD-10-CM | POA: Diagnosis not present

## 2023-04-25 DIAGNOSIS — S0990XA Unspecified injury of head, initial encounter: Secondary | ICD-10-CM

## 2023-04-25 DIAGNOSIS — W06XXXA Fall from bed, initial encounter: Secondary | ICD-10-CM | POA: Diagnosis not present

## 2023-04-25 DIAGNOSIS — Z8673 Personal history of transient ischemic attack (TIA), and cerebral infarction without residual deficits: Secondary | ICD-10-CM | POA: Insufficient documentation

## 2023-04-25 DIAGNOSIS — W19XXXA Unspecified fall, initial encounter: Secondary | ICD-10-CM

## 2023-04-25 MED ORDER — LORAZEPAM 2 MG/ML IJ SOLN
1.0000 mg | Freq: Once | INTRAMUSCULAR | Status: DC
  Filled 2023-04-25: qty 1

## 2023-04-25 NOTE — Discharge Instructions (Signed)
Your CT scans and x rays did not show any broken bones or bleeding. Return with any new or worsening symptoms.

## 2023-04-25 NOTE — ED Notes (Addendum)
Pt refusing IV access, explained to pt in detail that she needs the CT scans and needs to lay still for them. Pt says that she will lay still for the scans but does not want the medication.

## 2023-04-25 NOTE — ED Provider Notes (Signed)
Emergency Department Provider Note   I have reviewed the triage vital signs and the nursing notes.   HISTORY  Chief Complaint Fall (Pt fell approx 2 feet onto the floor from the bed, hitting head. No LOC. Is on eliquis. Hematoma to right forehead)   HPI Sarah Myers is a 52 y.o. 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  Patient states she was trying to get out of bed without assistance and fell to the floor.  The bed is approximately 1 to 2 feet off the ground according to EMS.  She was found on the ground with some swelling to her right forehead.  Patient is complaining also of some pain in her lower back from the fall.  No numbness or weakness which is new.  Denies abdominal or chest pain.    Past Medical History:  Diagnosis Date   Hypertension     Review of Systems  Constitutional: No fever/chills Cardiovascular: Denies chest pain. Respiratory: Denies shortness of breath. Gastrointestinal: No abdominal pain.  No nausea, no vomiting. Genitourinary: Negative for dysuria. Musculoskeletal: Positive for back pain. Skin: Negative for rash. Neurological: Positive HA. Baseline left arm/leg weakness.  ____________________________________________   PHYSICAL EXAM:  VITAL SIGNS: Vitals:   04/25/23 1857 04/25/23 2000  BP: (!) 204/134 (!) 221/123  Pulse: 100   Resp: 19 (!) 23  Temp: 98.3 F (36.8 C)   SpO2: 99%     Constitutional: Alert and oriented. Well appearing and in no acute distress. Eyes: Conjunctivae are normal.  Head: 4 x 4 cm hematoma to the right forehead. No laceration.  Nose: No congestion/rhinnorhea. Mouth/Throat: Mucous membranes are moist.  Oropharynx non-erythematous. Neck: No stridor.  No cervical spine tenderness to palpation. Cardiovascular: Normal rate, regular rhythm. Good peripheral circulation. Grossly normal heart sounds.    Respiratory: Normal respiratory effort.  No retractions. Lungs CTAB. Gastrointestinal: Soft and nontender. No distention.  Musculoskeletal: Contracted LUE and muscle wasting and increased tone of the LLE. No bony tenderness to the extremities on the right.  Neurologic:  Normal speech and language. No gross focal neurologic deficits are appreciated.  Skin:  Skin is warm, dry and intact. No rash noted.  ____________________________________________  RADIOLOGY  CT Head Wo Contrast  Result Date: 04/25/2023 CLINICAL DATA:  Fall EXAM: CT HEAD WITHOUT CONTRAST CT CERVICAL SPINE WITHOUT CONTRAST TECHNIQUE: Multidetector CT imaging of the head and cervical spine was performed following the standard protocol without intravenous contrast. Multiplanar CT image reconstructions of the cervical spine were also generated. RADIATION DOSE REDUCTION: This exam was performed according to the departmental dose-optimization program which includes automated exposure control, adjustment of the mA and/or kV according to patient size and/or use of iterative reconstruction technique. COMPARISON:  None Available. FINDINGS: CT HEAD FINDINGS Brain: There is no mass, hemorrhage or extra-axial collection. There is generalized atrophy without lobar predilection. There is hypoattenuation of the periventricular white matter, most commonly indicating chronic ischemic microangiopathy. There is an old left occipital lobe infarct. Vascular: No abnormal hyperdensity of the major intracranial arteries or dural venous sinuses. No intracranial atherosclerosis. Skull: Large right frontal scalp hematoma.  No skull fracture Sinuses/Orbits: No fluid levels or advanced mucosal thickening of the visualized paranasal sinuses. No mastoid or middle ear effusion. The orbits are normal. CT CERVICAL SPINE FINDINGS Alignment: No static subluxation. Facets are aligned. Occipital condyles are normally positioned. Skull base and  vertebrae: No acute fracture.  Soft tissues and spinal canal: No prevertebral fluid or swelling. No visible canal hematoma. Disc levels: No advanced spinal canal or neural foraminal stenosis. Upper chest: No pneumothorax, pulmonary nodule or pleural effusion. Other: Normal visualized paraspinal cervical soft tissues. IMPRESSION: 1. No acute intracranial abnormality. 2. Large right frontal scalp hematoma without skull fracture. 3. No acute fracture or static subluxation of the cervical spine. Electronically Signed   By: Deatra Robinson M.D.   On: 04/25/2023 20:57   CT Cervical Spine Wo Contrast  Result Date: 04/25/2023 CLINICAL DATA:  Fall EXAM: CT HEAD WITHOUT CONTRAST CT CERVICAL SPINE WITHOUT CONTRAST TECHNIQUE: Multidetector CT imaging of the head and cervical spine was performed following the standard protocol without intravenous contrast. Multiplanar CT image reconstructions of the cervical spine were also generated. RADIATION DOSE REDUCTION: This exam was performed according to the departmental dose-optimization program which includes automated exposure control, adjustment of the mA and/or kV according to patient size and/or use of iterative reconstruction technique. COMPARISON:  None Available. FINDINGS: CT HEAD FINDINGS Brain: There is no mass, hemorrhage or extra-axial collection. There is generalized atrophy without lobar predilection. There is hypoattenuation of the periventricular white matter, most commonly indicating chronic ischemic microangiopathy. There is an old left occipital lobe infarct. Vascular: No abnormal hyperdensity of the major intracranial arteries or dural venous sinuses. No intracranial atherosclerosis. Skull: Large right frontal scalp hematoma.  No skull fracture Sinuses/Orbits: No fluid levels or advanced mucosal thickening of the visualized paranasal sinuses. No mastoid or middle ear effusion. The orbits are normal. CT CERVICAL SPINE FINDINGS Alignment: No static subluxation. Facets are aligned. Occipital  condyles are normally positioned. Skull base and vertebrae: No acute fracture. Soft tissues and spinal canal: No prevertebral fluid or swelling. No visible canal hematoma. Disc levels: No advanced spinal canal or neural foraminal stenosis. Upper chest: No pneumothorax, pulmonary nodule or pleural effusion. Other: Normal visualized paraspinal cervical soft tissues. IMPRESSION: 1. No acute intracranial abnormality. 2. Large right frontal scalp hematoma without skull fracture. 3. No acute fracture or static subluxation of the cervical spine. Electronically Signed   By: Deatra Robinson M.D.   On: 04/25/2023 20:57   DG Chest Portable 1 View  Result Date: 04/25/2023 CLINICAL DATA:  Trauma EXAM: PORTABLE CHEST 1 VIEW COMPARISON:  12/12/2022 FINDINGS: The heart size and mediastinal contours are within normal limits. Both lungs are clear. The visualized skeletal structures are unremarkable. IMPRESSION: No active disease. Electronically Signed   By: Jasmine Pang M.D.   On: 04/25/2023 19:15   DG Pelvis Portable  Result Date: 04/25/2023 CLINICAL DATA:  Trauma fall EXAM: PORTABLE PELVIS 1-2 VIEWS COMPARISON:  11/26/2022 FINDINGS: Limited by positioning, the patient is rotated. Pubic symphysis grossly intact. No obvious fracture or malalignment IMPRESSION: Limited by positioning. No acute osseous abnormality. Electronically Signed   By: Jasmine Pang M.D.   On: 04/25/2023 19:09    ____________________________________________   PROCEDURES  Procedure(s) performed:   Procedures  None  ____________________________________________   INITIAL IMPRESSION / ASSESSMENT AND PLAN / ED COURSE  Pertinent labs & imaging results that were available during my care of the patient were reviewed by me and considered in my medical decision making (see chart for details).   This patient is Presenting for Evaluation of head injury, which does require a range of treatment options, and is a complaint that involves a high risk of  morbidity and mortality.  The Differential Diagnoses includes but is not exclusive to acute coronary syndrome,  aortic dissection, pulmonary embolism, cardiac tamponade, community-acquired pneumonia, pericarditis, musculoskeletal chest wall pain, etc.   I did obtain Additional Historical Information from EMS.   Radiologic Tests Ordered, included CXR and pelvis XR and CT head/c spine. I independently interpreted the images and agree with radiology interpretation.   Cardiac Monitor Tracing which shows NSR.    Social Determinants of Health Risk patient currently at a SNF given CVA.   Medical Decision Making: Summary:  Patient arrives to the emergency department for evaluation of pain after fall from bed.  She is anticoagulated and thus activated as a level 2 trauma upon arrival.  Most of her injuries appear to involve her head and scalp.  No lacerations to repair.  Plan for plain film imaging of the chest and pelvic area.  Additional trauma scans such as CT abdomen pelvis or lumbar spine but no significant tenderness on exam.  Reevaluation with update and discussion with patient. CT imaging and x-rays reassuring. On reassessment the patient is sitting up and speaking on the phone with family. Discussed results. Appears well and stable for discharge back to SNF.   Patient's presentation is most consistent with acute presentation with potential threat to life or bodily function.   Disposition: discharge  ____________________________________________  FINAL CLINICAL IMPRESSION(S) / ED DIAGNOSES  Final diagnoses:  Fall, initial encounter  Injury of head, initial encounter  Hematoma of scalp, initial encounter    Note:  This document was prepared using Dragon voice recognition software and may include unintentional dictation errors.  Alona Bene, MD, [facility] Emergency Medicine    Riel Hirschman, Arlyss Repress, MD 04/25/23 2114

## 2023-04-25 NOTE — Progress Notes (Signed)
   04/25/23 1905  Spiritual Encounters  Type of Visit Initial  Care provided to: Patient  Conversation partners present during encounter Nurse  Referral source Trauma page  Reason for visit Trauma  OnCall Visit Yes  Interventions  Spiritual Care Interventions Made Established relationship of care and support;Compassionate presence;Reflective listening;Prayer  Intervention Outcomes  Outcomes Connection to spiritual care;Awareness of support;Reduced anxiety  Spiritual Care Plan  Spiritual Care Issues Still Outstanding No further spiritual care needs at this time (see row info)   Chaplain Responded to level 2 trauma. Patient asked for prayer for herself and her daughter. Patient appeared sad and chaplain informed patient of how to request further chaplain services. MD entered the room to care for patient.   Arlyce Dice, Chaplain Resident [phone]

## 2023-04-25 NOTE — ED Notes (Signed)
Trauma Event Note  Patient taken to CT, placed on table. Patient educated about scan, >15 minutes spent attempting to get scan. Patient finally said she did not want to complete scan. Patient returned to room and MD notified.  Jill Side Grethel Zenk  Trauma Response RN  Please call TRN at [phone] for further assistance.

## 2023-04-25 NOTE — ED Notes (Signed)
Pt brought back from CT, reported pt would not lay still for scans. Long, MD notified.

## 2023-05-22 ENCOUNTER — Encounter
Payer: Medicaid Other | Attending: Physical Medicine and Rehabilitation | Admitting: Physical Medicine and Rehabilitation

## 2023-05-22 VITALS — BP 157/96 | HR 66 | Ht 63.0 in

## 2023-05-22 DIAGNOSIS — R252 Cramp and spasm: Secondary | ICD-10-CM | POA: Insufficient documentation

## 2023-05-22 MED ORDER — ONABOTULINUMTOXINA 100 UNITS IJ SOLR
400.0000 [IU] | Freq: Once | INTRAMUSCULAR | Status: AC
  Administered 2023-05-22: 400 [IU] via INTRAMUSCULAR

## 2023-05-22 MED ORDER — SODIUM CHLORIDE (PF) 0.9 % IJ SOLN
4.0000 mL | Freq: Once | INTRAMUSCULAR | Status: AC
  Administered 2023-05-22: 4 mL via INTRAVENOUS

## 2023-05-22 NOTE — Addendum Note (Signed)
Addended by: Janean Sark on: 05/22/2023 02:22 PM   Modules accepted: Orders

## 2023-05-22 NOTE — Progress Notes (Signed)
Botox Injection for spasticity using US guidance   Indication: Severe spasticity which interferes with ADL,mobility and/or  hygiene and is unresponsive to medication management and other conservative care Informed consent was obtained after describing risks and benefits of the procedure with the patient. This includes bleeding, bruising, infection, excessive weakness, or medication side effects. A REMS form is on file and signed.  Number of units per muscle Biceps 150 FCR: 25 FCU: 25 FDS: 25 FDP: 25 Hamstrings: 150 All injections were done after obtaining appropriate ultrasound visualization and after negative drawback for blood. The patient tolerated the procedure well. Post procedure instructions were given. A followup appointment was made.   

## 2023-05-22 NOTE — Progress Notes (Signed)
Subjective:    Patient ID: Sarah Myers, female    DOB: 06-26-1971, 52 y.o.   MRN: [id number]  HPI Sarah Myers presents for f/u of insomnia, impaired mobility and ADLs due to stroke, and spasticity  1) Insomnia -sleeping better now -the baclofen helps her to sleep  2) Spasticity -still tight in upper and lower extremities at times -she had a good response to last Botox -daughter thinks patient developed hallucinations in response to tizanidine and these have resolved since she has stopped tizanidine -she has restarted Baclofen -Botox has helped but she is still tight -she is willing to try Nexwave today  3) Swelling of all limbs -improved -the DME company no longer provides compression garments  4) Frequent urination -improved -no dysuria  5) Impaired mobility -her daughter would like to be able to bring her to her doctor's appointments but is unable to due to lack of transportation. 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  -she is now in a facility at [facility] -she wants to come home.   6) Hallucinations: -daughter notes these have started after she started taking Tizanidine -has resolved since tizanidine has stopped  7) Anxiety -daughter notes it continues to be severe at times -she has picked up hydroxyzine and will try this, daughter feels this has helped -she is having a lot of anxiety this morning -her daughter gave her hydroxyzine at 5am and asks what she should do now -she is willing to try additional anxiety medication in between hydroxyzine if this is not effective enough -she is willing to speak with a pyschiatrist  8) Impaired memory and cognition -daughter notes impaired memory and cognition and asks if her mom could be getting early dementia, she asks what can be done  for this, she asks if any testing is needed  9) Decreased appetite: --her daughter is concerned about her losing weight  10) Daytime somnolence  11) Depression: her daughter would like for her to see a psychologist for therapy.   12) HTN -elevated today to 151/97 -she says they check her BP at the facility but she is not sure how it usually runs  13) Pain at elbow  Pain Inventory Average Pain 10 Pain Right Now 10 My pain is aching  In the last 24 hours, has pain interfered with the following? General activity 10 Relation with others 10 Enjoyment of life 10 What TIME of day is your pain at its worst? varies Sleep (in general) Fair  Pain is worse with: some activites Pain improves with:  . Relief from Meds:  .  Family History  Problem Relation Age of Onset   Diabetes Mother    Hypertension Mother    Heart disease Mother    Diabetes Maternal Aunt    Stroke Maternal Uncle    Diabetes Maternal Grandmother    Heart disease Maternal Grandmother    Diabetes Maternal Grandfather    Heart disease Maternal Grandfather    Heart disease Paternal Grandmother    Heart disease Paternal Grandfather    Social History   Socioeconomic History   Marital status: Married    Spouse name: Not on file   Number of children: Not on file   Years of education: Not on file   Highest education level: Not on file  Occupational  History   Not on file  Tobacco Use   Smoking status: Never   Smokeless tobacco: Never  Vaping Use   Vaping Use: Never used  Substance and Sexual Activity   Alcohol use: Not Currently    Alcohol/week: 0.0 standard drinks of alcohol   Drug use: No   Sexual activity: Never  Other Topics Concern   Not on file  Social History Narrative   Not on file   Social Determinants of Health   Financial Resource Strain: Not on file  Food Insecurity: Not on file  Transportation Needs: Not on file  Physical Activity: Not on file  Stress: Not on file  Social  Connections: Not on file   Past Surgical History:  Procedure Laterality Date   BREAST LUMPECTOMY  1992   CESAREAN SECTION     IR GASTROSTOMY TUBE MOD SED  03/06/2022   ORIF RADIAL FRACTURE  02/10/2012   Procedure: OPEN REDUCTION INTERNAL FIXATION (ORIF) RADIAL FRACTURE;  Surgeon: Cammy Copa, MD;  Location: [facility] OR;  Service: Orthopedics;  Laterality: Right;   ORIF ULNAR FRACTURE  02/10/2012   Procedure: OPEN REDUCTION INTERNAL FIXATION (ORIF) ULNAR FRACTURE;  Surgeon: Cammy Copa, MD;  Location: [facility] OR;  Service: Orthopedics;  Laterality: Right;   TUBAL LIGATION     Past Surgical History:  Procedure Laterality Date   BREAST LUMPECTOMY  1992   CESAREAN SECTION     IR GASTROSTOMY TUBE MOD SED  03/06/2022   ORIF RADIAL FRACTURE  02/10/2012   Procedure: OPEN REDUCTION INTERNAL FIXATION (ORIF) RADIAL FRACTURE;  Surgeon: Cammy Copa, MD;  Location: [facility] OR;  Service: Orthopedics;  Laterality: Right;   ORIF ULNAR FRACTURE  02/10/2012   Procedure: OPEN REDUCTION INTERNAL FIXATION (ORIF) ULNAR FRACTURE;  Surgeon: Cammy Copa, MD;  Location: [facility] OR;  Service: Orthopedics;  Laterality: Right;   TUBAL LIGATION     Past Medical History:  Diagnosis Date   Hypertension    BP (!) 157/96   Pulse 66   Ht 5\' 3"  (1.6 m)   SpO2 96%   BMI 19.21 kg/m   Opioid Risk Score:   Fall Risk Score:  `1  Depression screen [facility] 2/9     09/19/2021    8:21 AM 06/29/2020   10:04 AM 05/18/2020    3:14 PM 10/17/2016   11:46 AM 09/26/2016   11:01 AM 12/23/2014   11:09 AM  Depression screen PHQ 2/9  Decreased Interest 0 2 0 2 3 0  Down, Depressed, Hopeless 3 3 0 3 1 1   PHQ - 2 Score 3 5 0 5 4 1   Altered sleeping 0 3  1 0   Tired, decreased energy 0 2  2 1    Change in appetite 0 2  1 0   Feeling bad or failure about yourself  2 0  1 2   Trouble concentrating 0 0  2 0   Moving slowly or fidgety/restless 0 0  0 0   Suicidal thoughts 0 0  0 0   PHQ-9 Score 5 12  12 7       Review of  Systems  Musculoskeletal:        Left arm pain  All other systems reviewed and are negative.  +swelling of all limbs, +spasticity, +insomnia, +hallucinations    Objective:   Physical Exam Gen: no distress, normal appearing, BP 151/97 HEENT: oral mucosa pink and moist, NCAT Cardio: Reg rate Chest: normal effort, normal rate of breathing Abd: soft,  non-distended Ext: no edema Psych: pleasant, normal affect Skin: intact Neuro: Falling asleep in office today but able to state that she is tired, MAS 3 in LUE and LLE       Assessment & Plan:   1) Insomnia -improved -d/c tizanidine and restart baclofen  2) Pain from spasticity -d/c tizanidine and restart baclofen -increase baclofen to 15mg  TID -continue Botox q3 months, will plan for 400U in LUE and LLE next visit Prescribed Zynex Nexwave -Discussed current symptoms of pain and history of pain.  -Discussed benefits of exercise in reducing pain. -Discussed following foods that may reduce pain: 1) Ginger (especially studied for arthritis)- reduce leukotriene production to decrease inflammation 2) Blueberries- high in phytonutrients that decrease inflammation 3) Salmon- marine omega-3s reduce joint swelling and pain 4) Pumpkin seeds- reduce inflammation 5) dark chocolate- reduces inflammation 6) turmeric- reduces inflammation 7) tart cherries - reduce pain and stiffness 8) extra virgin olive oil - its compound olecanthal helps to block prostaglandins  9) chili peppers- can be eaten or applied topically via capsaicin 10) mint- helpful for headache, muscle aches, joint pain, and itching 11) garlic- reduces inflammation  Link to further information on diet for chronic pain: [email or web address]   3) Swelling of limbs: -improved -prescribed extra large compression garments for bilateral lower extremities, small for bilateral upper extremities, discussed that  she could purchase these online and send receipt to insurance for reimbursement  4) Impaired mobility and ADLs -continue PT, OT -reached out to SW and emailed patient [employer] [place] forms to complete for transportation -helped daughter to fill out each item on the [employer] [place] forms and asked her to mail forms to our office for [place] to send on her behalf since she does not have a fax machine -discussed every question on [employer] [place] form and guiding daughter on how to answer questions accurately based on her mother's condition. There is a Part B on the form that needs to be filled by [place], discussed with daughter that she can mail this to our office and I can completed this portion before we fax the form for her, discussed with patient and daughter her major limitations of left sided weakness and how this inhibits her from walking, her inability to navigate the stairs in her apartment which has thus far prevented her from leaving her apartment, the fact that she does not have a ramp, her difficulty in getting to and from doctor's appointments due to this impaired mobility, the fact that she does not have a wheelchair ramp at home -[employer] [place] to call patient to answer some of her questions -provided note stating that daughter has been unable to work due to caring for her mother full time at home  4) Anxiety -continue hydroxyzine prn -prescribed buspar 5mg  TID PRN for use in between hydroxyzine if this is not effective enough -referred to psychiatry -discussed eating 2 [place] nuts per day -discussed decreasing Zoloft to 25mg  since daughter would prefer to use PRN medication for her -discussed benefits of meditation and exercise -Discussed exercise and meditation as tools to decrease anxiety. -Recommended Down Dog Yoga app -Discussed spending time outdoors. -Discussed positive re-framing of anxiety.  -Discussed the following foods that have been show to reduce anxiety: 1) [place] nuts, mushrooms,  soy beans due to their high selenium content. Upper limit of toxicity of selenium is 445mcg/day so no more than 3-4 [place] nuts per day.  2) Fatty fish such as salmon, mackerel, sardines, trout, and herring- high in omega-3  fatty acids 3) Eggs- increases serotonin and dopamine 4) Pumpkin seeds- high in omega-3 fatty acids 5) dark chocolate- high in flavanols that increase blood flow to brain 6) turmeric- take with black pepper to increase absorption 7) chamomile tea- antioxidant and anti-inflammatory properties 8) yogurt without sugar- supports gut-brain axis 9) green tea- contains L- theanine 10) blueberries- high in vitamin C and antioxidants 11) [place]- high in tryptophan which gets converted to serotonin 12) bell peppers- rich in vitamin C and antioxidants 13) citrus fruits- rich in vitamin C and antioxidants 14) almonds- high in vitamin E and healthy fats 15) chia seeds- high in omega-3 fatty acids  5) Polypharmacy -reviewed all medications, their indications, and potential side effects with daughter.   6) GERD -recommended using pepcid PRN  7) HTN -continue Cozaar -increase baclofen to 15mg  TID -drink 6-8 glasses of water per day to protect kidneys while using Cozaar -continue magnesium supplement -discussed excellent BM -Advised checking BP daily at home and logging results to bring into follow-up appointment with PCP and myself. -Reviewed BP meds today.  -Advised checking BP daily at home and logging results to bring into follow-up appointment with PCP and myself. -Reviewed BP meds today.  -Advised checking BP daily at home and logging results to bring into follow-up appointment with PCP and myself. -Reviewed BP meds today.  -Advised regarding healthy foods that can help lower blood pressure and provided with a list: 1) citrus foods- high in vitamins and minerals 2) salmon and other fatty fish - reduces inflammation and oxylipins 3) swiss chard (leafy green)- high level  of nitrates 4) pumpkin seeds- one of the best natural sources of magnesium 5) Beans and lentils- high in fiber, magnesium, and potassium 6) Berries- high in flavonoids 7) Amaranth (whole grain, can be cooked similarly to rice and oats)- high in magnesium and fiber 8) Pistachios- even more effective at reducing BP than other nuts 9) Carrots- high in phenolic compounds that relax blood vessels and reduce inflammation 10) Celery- contain phthalides that relax tissues of arterial walls 11) Tomatoes- can also improve cholesterol and reduce risk of heart disease 12) Broccoli- good source of magnesium, calcium, and potassium 13) Greek yogurt: high in potassium and calcium 14) Herbs and spices: Celery seed, cilantro, saffron, lemongrass, black cumin, ginseng, cinnamon, cardamom, sweet basil, and ginger 15) Chia and flax seeds- also help to lower cholesterol and blood sugar 16) Beets- high levels of nitrates that relax blood vessels  17) spinach and bananas- high in potassium  -Provided lise of supplements that can help with hypertension:  1) magnesium: one high quality brand is Bioptemizers since it contains all 7 types of magnesium, otherwise over the counter magnesium gluconate 400mg  is a good option 2) B vitamins 3) vitamin D 4) potassium 5) CoQ10 6) L-arginine 7) Vitamin C 8) Beetroot -Educated that goal BP is 120/80. -Made goal to incorporate some of the above foods into diet.    8) History of CVA -continue multivitamin  9) Cognitive impairment:  -discussed that cognitive impairment can be secondary to stroke, her baclofen medication -discussed that neurology can help with cognitive testing to assess for dementia -discussed patient's risk for vascular demential given stroke and cardiac history -recommended avoiding processed foods such as added sugar, bread/pasta/rice -discussed that insulin resistance can worsen cognitive impairment -recommended eating fruits, vegetables, lean  meats, olive oil, nuts, and yogurt. Highlighted salmon, blueberries and walnuts as foods that enhance cognition  10) Pain from spasticity:  --Provided with a pain relief  journal and discussed that it contains foods and lifestyle tips to naturally help to improve pain. Discussed that these lifestyle strategies are also very good for health unlike some medications which can have negative side effects. Discussed that the act of keeping a journal can be therapeutic and helpful to realize patterns what helps to trigger and alleviate pain.   -continue baclofen -prescribed Zynex Nexwave  11) Depression: -referred to Dr. Kieth Brightly, neuropsych  12) decreased appetite: -discussed mirtazepine but patient defers at this time

## 2023-06-20 ENCOUNTER — Encounter: Payer: Medicaid Other | Attending: Physical Medicine and Rehabilitation | Admitting: Psychology

## 2023-06-20 DIAGNOSIS — F331 Major depressive disorder, recurrent, moderate: Secondary | ICD-10-CM | POA: Diagnosis not present

## 2023-06-20 DIAGNOSIS — R252 Cramp and spasm: Secondary | ICD-10-CM | POA: Diagnosis present

## 2023-06-20 DIAGNOSIS — F411 Generalized anxiety disorder: Secondary | ICD-10-CM

## 2023-06-20 DIAGNOSIS — G4701 Insomnia due to medical condition: Secondary | ICD-10-CM | POA: Diagnosis not present

## 2023-08-02 ENCOUNTER — Encounter: Payer: Medicaid Other | Admitting: Psychology

## 2023-08-23 ENCOUNTER — Encounter: Payer: Self-pay | Admitting: Physical Medicine and Rehabilitation

## 2023-08-23 ENCOUNTER — Encounter
Payer: Medicaid Other | Attending: Physical Medicine and Rehabilitation | Admitting: Physical Medicine and Rehabilitation

## 2023-08-23 VITALS — BP 170/101 | HR 71 | Ht 63.0 in | Wt 100.0 lb

## 2023-08-23 DIAGNOSIS — R252 Cramp and spasm: Secondary | ICD-10-CM | POA: Insufficient documentation

## 2023-08-23 MED ORDER — SODIUM CHLORIDE (PF) 0.9 % IJ SOLN
3.0000 mL | INTRAMUSCULAR | Status: DC | PRN
Start: 2023-08-23 — End: 2024-04-17
  Administered 2023-08-23: 3 mL

## 2023-08-23 MED ORDER — ONABOTULINUMTOXINA 100 UNITS IJ SOLR
400.0000 [IU] | Freq: Once | INTRAMUSCULAR | Status: AC
Start: 2023-08-23 — End: 2023-08-23
  Administered 2023-08-23: 400 [IU] via INTRAMUSCULAR

## 2023-08-23 NOTE — Progress Notes (Signed)
Botox Injection for spasticity using US guidance   Indication: Severe spasticity which interferes with ADL,mobility and/or  hygiene and is unresponsive to medication management and other conservative care Informed consent was obtained after describing risks and benefits of the procedure with the patient. This includes bleeding, bruising, infection, excessive weakness, or medication side effects. A REMS form is on file and signed.  Number of units per muscle Biceps 150 FCR: 25 FCU: 25 FDS: 25 FDP: 25 Hamstrings: 150 All injections were done after obtaining appropriate ultrasound visualization and after negative drawback for blood. The patient tolerated the procedure well. Post procedure instructions were given. A followup appointment was made.   

## 2023-09-12 ENCOUNTER — Encounter: Payer: Self-pay | Admitting: Psychology

## 2023-09-12 NOTE — Progress Notes (Signed)
Neuropsychological Consultation   Patient:   Sarah Myers   DOB:   08-07-1971  MR Number:  [id number]  Location:  [facility] [place] [facility] [address] [place], [address] [place] [address] Dept: [phone]           Date of Service:   06/20/2023  Location of Service and Individuals present: Today's visit was conducted in my outpatient clinic office with the patient myself present.  Start Time:   10 AM End Time:   12 PM  Today's visit consisted of a 1 hour and 15-minute face-to-face clinical interview with the other 45 minutes dealing with report writing, setting up therapeutic protocols and schedule and records reviewed.  Patient Consent and Confidentiality: Limits of confidentiality reviewed including patient's notes being made available in his EMR for treating physicians to be able to review as well as other appropriate medical providers.  Patient consents to these issues related to confidentiality.  Consent for Evaluation and Treatment:  Signed:  Yes Explanation of Privacy Policies:  Signed:  Yes Discussion of Confidentiality Limits:  Yes  Provider/Observer:  Arley Phenix, Psy.D.       Clinical Neuropsychologist       Billing Code/Service: [phone]  Chief Complaint:     Chief Complaint  Patient presents with   Gait Problem   Memory Loss   Other    Confusion   Cerebrovascular Accident    Reason for Service:    Sarah Myers is a 51 year old female referred for neuropsychological consultation by her treating physiatrist Sula Soda, MD due to ongoing issues related to impaired motor functioning and currently requiring wheelchair for mobility, left-sided weakness and limited/occasional memory issues and memory loss with confusion.  Patient with past medical history including history of hypertension, general anxiety disorder, and history of acute kidney difficulties and acute  respiratory issues prior to her stroke.  Patient had presented to the emergency department on 02/05/2022 with left-sided weakness, slurred speech and hypertensive emergency.  Neuroimaging identified right basal ganglia hemorrhage with brain compression.  Stroke was felt to be due to unmanaged high blood pressure.  Patient required intubation and ultimately tracheostomy and PEG placement later.  Patient was treated for MSSA pneumonia with CTA of chest on 3/7 revealing bilateral pulmonary emboli.  Patient was admitted onto the CIR rehab program on 04/04/2022 after [facility].  Patient worked with [facility] with PT, OT, speech therapy and medical care provided throughout his admission.  Blood pressure remained stable during his admission.  During CIR she had improvements in endurance, balance, postural control and developing compensation skills for deficits.  At discharge she was able to transition into standing with min assist and maintain mid to moderate assistance for standing.  Patient has been followed by Dr. Carlis Abbott in our outpatient physical medicine clinic postdischarge with ongoing issues with insomnia, spasticity in upper and lower extremities at times, swelling in limbs, frequent urination and impairments in mobility.  Patient was referred for neuropsychological consultation primarily for therapeutic interventions due to ongoing coping issues with a past history of anxiety.  Patient is still in a nursing facility at [facility] and has been there since July.  Patient continues with significant left-sided motor deficits and weakness and can only move her right side primarily.  There is visual neglect noted still for left visual field and deficits with weakness and mobility issues.  Patient has had ongoing  issues with sleep and at times some visual hallucinations.  Patient noted that she has concerns about the development of early dementia due to memory loss  although this is likely directly related to cerebrovascular accident.  Patient and family report that the sudden changes have a created a significant physical, emotional and mental effect on all of the family and they have been working hard to try to maintain the patient's overall wellbeing, motivation and mood state.  Most recent head CT was produced on 04/25/2023 after fall and there were no acute process noted but generalized atrophy without lobar predilection.  There was hypoattenuation in the periventricular white matter typically associated with chronic ischemic microangiopathic/small vessel disease and also noted left occipital lobe infarct.  The patient has had a number of other CT scans performed since dischargeAfter fall with after falls with facial injury and blunt trauma noting stable remote infarcts but no acute process.  MRI brain was performed while the patient was hospitalized that was done without contrast.  Interpretation was produced by Dr. Allena Katz.  Acute to early subacute large parenchymal hemorrhage was noted with surrounding edema and large regional mass effect.  Intraventricular extension was noted.  Region was identified in the right basal ganglia, surrounding white matter regions and extending into the temporal lobe.  There was noted T2 hyperintense foci in white matter regions reflecting chronic microvascular ischemic changes.  Medical History:   Past Medical History:  Diagnosis Date   Hypertension          Patient Active Problem List   Diagnosis Date Noted   Hallucination, visual 12/12/2022   Acute cystitis 11/28/2022   Hypokalemia 11/28/2022   COVID-19 11/27/2022   Palliative care by specialist    Goals of care, counseling/discussion    Acute encephalopathy 06/28/2022   Bacteremia 06/28/2022   Metabolic acidosis 06/28/2022   Sepsis due to gram-negative UTI (HCC) 06/24/2022   Essential hypertension 06/24/2022   History of CVA (cerebrovascular accident) 06/24/2022    History of pulmonary embolism 06/24/2022   Anxiety and depression 06/24/2022   Acute metabolic encephalopathy 06/24/2022   Altered mental status 06/23/2022   ICH (intracerebral hemorrhage) (HCC) 04/04/2022   Hypercalcemia 03/21/2022   Oral thrush 03/13/2022   Dysphagia 03/08/2022   Counseling regarding goals of care 03/08/2022   Tracheostomy dependent (HCC)    Bilateral pulmonary embolism (HCC)    AKI (acute kidney injury) (HCC)    Acute on chronic respiratory failure with hypoxia (HCC)    Pressure injury of skin 02/07/2022   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  There was hypoattenuation in the periventricular white matter typically associated with chronic ischemic microangiopathic/small vessel disease and also noted left occipital lobe infarct.  The patient has had a number of other CT scans performed since dischargeAfter fall with after falls with facial injury and blunt trauma noting stable remote infarcts but no acute process.  MRI brain was performed while the patient was hospitalized that was done without contrast.  Interpretation was produced by Dr. Allena Katz.  Acute to early subacute large parenchymal hemorrhage was noted with surrounding edema and large regional mass effect.  Intraventricular extension was noted.  Region was identified in the right basal ganglia, surrounding white matter regions and extending into the temporal lobe.  There was noted T2 hyperintense foci in white matter regions reflecting chronic microvascular ischemic changes.   Sleep: Patient's sleep is described  as fine with no significant issues and no identifications of obstructive sleep apnea other pulmonary issues.    Diet Pattern: Appetite is described as fair/good and she does have a relatively  stable diet where she is living.    Behavioral Observation/Mental Status:   ADALAE KAAI  presents as a 52 y.o.-year-old Right handed African American Female who appeared her stated age. her dress was Appropriate and she was Well Groomed and her manners were Appropriate to the situation.  her participation was indicative of Appropriate behaviors.  There were physical disabilities noted.  she displayed an appropriate level of cooperation and motivation.    Interactions:    Active Appropriate and Redirectable  Attention:   abnormal and attention span and concentration were age appropriate  Memory:   abnormal; remote memory intact, recent memory impaired  Visuo-spatial:   not examined But some issues of neglect have been noted  Speech (Volume):  low  Speech:   normal; slurred  Thought Process:  Coherent and Relevant  Coherent and Directed  Though Content:  WNL; not suicidal and not homicidal  Orientation:   person, place, and situation  Judgment:   Fair  Planning:   Fair  Affect:    Anxious  Mood:    Anxious  Insight:   Good  Intelligence:   normal  Marital Status/Living:  Patient was born and raised in [place] along with 3 siblings.  She is currently living in a skilled nursing facility as well as spending time at home with her daughter.  Patient is widowed.  Patient has 4 adult children age 61, 20, 23 and a son in his mid 63s.  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  Hobbies and Interests: Hobbies and interests have included reading, writing,  watching wrestling.  Impact of Symptoms on Work or School:  Patient has not been able to return to work.  Impact of Symptoms on Social Functioning and Interpersonal Relationships: Significant stress on the entire family due to motor impairments.   Psychiatric History:    Abuse/Trauma History: Patient had been diagnosed with generalized anxiety disorder back in 2021.  Patient continues to take BuSpar to help with anxiety that was started posthospital.  Patient is also been taking sertraline for mood.  History of Substance Use or Abuse:  No concerns of substance abuse are reported.  Family Med/Psych History:  Family History  Problem Relation Age of Onset   Diabetes Mother    Hypertension Mother    Heart disease Mother    Diabetes Maternal Aunt    Stroke Maternal Uncle    Diabetes Maternal Grandmother    Heart disease Maternal Grandmother    Diabetes Maternal Grandfather    Heart disease Maternal Grandfather    Heart disease Paternal Grandmother    Heart disease Paternal Grandfather      Impression/DX:   YEVONNE SONCRANT is a 52 year old female referred for neuropsychological consultation by her treating physiatrist Sula Soda, MD due to  ongoing issues related to impaired motor functioning and currently requiring wheelchair for mobility, left-sided weakness and limited/occasional memory issues and memory loss with confusion.  Patient with past medical history including history of hypertension, general anxiety disorder, and history of acute kidney difficulties and acute respiratory issues prior to her stroke.  Patient had presented to the emergency department on 02/05/2022 with left-sided weakness, slurred speech and hypertensive emergency.  Neuroimaging identified right basal ganglia hemorrhage with brain compression.  Stroke was felt to be due to unmanaged high blood pressure.  Patient required intubation and ultimately tracheostomy and PEG placement later.  Patient was treated for  MSSA pneumonia with CTA of chest on 3/7 revealing bilateral pulmonary emboli.  Patient was admitted onto the CIR rehab program on 04/04/2022 after [facility].  Patient worked with [facility] with PT, OT, speech therapy and medical care provided throughout his admission.  Blood pressure remained stable during his admission.  During CIR she had improvements in endurance, balance, postural control and developing compensation skills for deficits.  At discharge she was able to transition into standing with min assist and maintain mid to moderate assistance for standing.  Patient has been followed by Dr. Carlis Abbott in our outpatient physical medicine clinic postdischarge with ongoing issues with insomnia, spasticity in upper and lower extremities at times, swelling in limbs, frequent urination and impairments in mobility.  Patient was referred for neuropsychological consultation primarily for therapeutic interventions due to ongoing coping issues with a past history of anxiety.  Disposition/Plan:  We have set the patient up for individual therapeutic intervention to help improve coping and adjustment issues due to significant residual motor deficits.  Anxiety and coping issues are present for the patient and challenges for the family itself.  Diagnosis:    Moderate episode of recurrent major depressive disorder (HCC)  GAD (generalized anxiety disorder)  Spasticity  Insomnia due to medical condition        Note: This document was prepared using Dragon voice recognition software and may include unintentional dictation errors.   Electronically Signed   _______________________ Arley Phenix, Psy.D. Clinical Neuropsychologist

## 2023-10-13 ENCOUNTER — Emergency Department (HOSPITAL_COMMUNITY): Payer: Medicaid Other

## 2023-10-13 ENCOUNTER — Inpatient Hospital Stay (HOSPITAL_COMMUNITY)
Admission: EM | Admit: 2023-10-13 | Discharge: 2023-10-16 | DRG: 388 | Disposition: A | Discharge status: Skilled Nursing Facility | Payer: Medicaid Other | Source: Skilled Nursing Facility | Attending: Family Medicine | Admitting: Family Medicine

## 2023-10-13 ENCOUNTER — Observation Stay (HOSPITAL_COMMUNITY): Payer: Medicaid Other

## 2023-10-13 DIAGNOSIS — R4182 Altered mental status, unspecified: Secondary | ICD-10-CM | POA: Diagnosis present

## 2023-10-13 DIAGNOSIS — I69319 Unspecified symptoms and signs involving cognitive functions following cerebral infarction: Secondary | ICD-10-CM

## 2023-10-13 DIAGNOSIS — Z1152 Encounter for screening for COVID-19: Secondary | ICD-10-CM

## 2023-10-13 DIAGNOSIS — G9341 Metabolic encephalopathy: Secondary | ICD-10-CM | POA: Diagnosis present

## 2023-10-13 DIAGNOSIS — F39 Unspecified mood [affective] disorder: Secondary | ICD-10-CM | POA: Diagnosis present

## 2023-10-13 DIAGNOSIS — Z823 Family history of stroke: Secondary | ICD-10-CM

## 2023-10-13 DIAGNOSIS — I10 Essential (primary) hypertension: Secondary | ICD-10-CM | POA: Diagnosis present

## 2023-10-13 DIAGNOSIS — K5900 Constipation, unspecified: Secondary | ICD-10-CM

## 2023-10-13 DIAGNOSIS — Z888 Allergy status to other drugs, medicaments and biological substances status: Secondary | ICD-10-CM

## 2023-10-13 DIAGNOSIS — Z86711 Personal history of pulmonary embolism: Secondary | ICD-10-CM | POA: Diagnosis present

## 2023-10-13 DIAGNOSIS — I69391 Dysphagia following cerebral infarction: Secondary | ICD-10-CM

## 2023-10-13 DIAGNOSIS — E860 Dehydration: Secondary | ICD-10-CM | POA: Diagnosis present

## 2023-10-13 DIAGNOSIS — Z79899 Other long term (current) drug therapy: Secondary | ICD-10-CM

## 2023-10-13 DIAGNOSIS — Z8673 Personal history of transient ischemic attack (TIA), and cerebral infarction without residual deficits: Secondary | ICD-10-CM

## 2023-10-13 DIAGNOSIS — R410 Disorientation, unspecified: Principal | ICD-10-CM

## 2023-10-13 DIAGNOSIS — G9340 Encephalopathy, unspecified: Secondary | ICD-10-CM

## 2023-10-13 DIAGNOSIS — I69354 Hemiplegia and hemiparesis following cerebral infarction affecting left non-dominant side: Secondary | ICD-10-CM

## 2023-10-13 DIAGNOSIS — Z833 Family history of diabetes mellitus: Secondary | ICD-10-CM

## 2023-10-13 DIAGNOSIS — Z8249 Family history of ischemic heart disease and other diseases of the circulatory system: Secondary | ICD-10-CM

## 2023-10-13 DIAGNOSIS — K5641 Fecal impaction: Principal | ICD-10-CM | POA: Diagnosis present

## 2023-10-13 DIAGNOSIS — Z7901 Long term (current) use of anticoagulants: Secondary | ICD-10-CM

## 2023-10-13 DIAGNOSIS — K219 Gastro-esophageal reflux disease without esophagitis: Secondary | ICD-10-CM | POA: Diagnosis present

## 2023-10-13 LAB — URINALYSIS, W/ REFLEX TO CULTURE (INFECTION SUSPECTED)
Bilirubin Urine: NEGATIVE
Glucose, UA: NEGATIVE mg/dL
Hgb urine dipstick: NEGATIVE
Ketones, ur: 15 mg/dL — AB
Leukocytes,Ua: NEGATIVE
Nitrite: NEGATIVE
Protein, ur: NEGATIVE mg/dL
Specific Gravity, Urine: 1.02 (ref 1.005–1.030)
pH: 6.5 (ref 5.0–8.0)

## 2023-10-13 LAB — COMPREHENSIVE METABOLIC PANEL
ALT: 18 U/L (ref 0–44)
AST: 21 U/L (ref 15–41)
Albumin: 3.9 g/dL (ref 3.5–5.0)
Alkaline Phosphatase: 82 U/L (ref 38–126)
Anion gap: 11 (ref 5–15)
BUN: 10 mg/dL (ref 6–20)
CO2: 23 mmol/L (ref 22–32)
Calcium: 11.3 mg/dL — ABNORMAL HIGH (ref 8.9–10.3)
Chloride: 107 mmol/L (ref 98–111)
Creatinine, Ser: 0.71 mg/dL (ref 0.44–1.00)
GFR, Estimated: >60 mL/min (ref >60)
Glucose, Bld: 126 mg/dL — ABNORMAL HIGH (ref 70–99)
Potassium: 3.9 mmol/L (ref 3.5–5.1)
Sodium: 141 mmol/L (ref 135–145)
Total Bilirubin: 0.5 mg/dL (ref 0.3–1.2)
Total Protein: 7.6 g/dL (ref 6.5–8.1)

## 2023-10-13 LAB — CBC WITH DIFFERENTIAL/PLATELET
Abs Immature Granulocytes: 0.02 10*3/uL (ref 0.00–0.07)
Basophils Absolute: 0.1 10*3/uL (ref 0.0–0.1)
Basophils Relative: 1 %
Eosinophils Absolute: 0 10*3/uL (ref 0.0–0.5)
Eosinophils Relative: 0 %
HCT: 40.8 % (ref 36.0–46.0)
Hemoglobin: 13.4 g/dL (ref 12.0–15.0)
Immature Granulocytes: 0 %
Lymphocytes Relative: 25 %
Lymphs Abs: 1.6 10*3/uL (ref 0.7–4.0)
MCH: 27.5 pg (ref 26.0–34.0)
MCHC: 32.8 g/dL (ref 30.0–36.0)
MCV: 83.6 fL (ref 80.0–100.0)
Monocytes Absolute: 0.3 10*3/uL (ref 0.1–1.0)
Monocytes Relative: 5 %
Neutro Abs: 4.5 10*3/uL (ref 1.7–7.7)
Neutrophils Relative %: 69 %
Platelets: 228 10*3/uL (ref 150–400)
RBC: 4.88 MIL/uL (ref 3.87–5.11)
RDW: 12.6 % (ref 11.5–15.5)
WBC: 6.4 10*3/uL (ref 4.0–10.5)
nRBC: 0 % (ref 0.0–0.2)

## 2023-10-13 LAB — RESP PANEL BY RT-PCR (RSV, FLU A&B, COVID)  RVPGX2
Influenza A by PCR: NEGATIVE
Influenza B by PCR: NEGATIVE
Resp Syncytial Virus by PCR: NEGATIVE
SARS Coronavirus 2 by RT PCR: NEGATIVE

## 2023-10-13 LAB — PROTIME-INR
INR: 1.2 (ref 0.8–1.2)
Prothrombin Time: 15.2 s (ref 11.4–15.2)

## 2023-10-13 LAB — TSH: TSH: 3.505 u[IU]/mL (ref 0.350–4.500)

## 2023-10-13 LAB — HIV ANTIBODY (ROUTINE TESTING W REFLEX): HIV Screen 4th Generation wRfx: NONREACTIVE

## 2023-10-13 LAB — APTT: aPTT: 34 s (ref 24–36)

## 2023-10-13 LAB — I-STAT CG4 LACTIC ACID, ED: Lactic Acid, Venous: 1.3 mmol/L (ref 0.5–1.9)

## 2023-10-13 LAB — AMMONIA: Ammonia: 25 umol/L (ref 9–35)

## 2023-10-13 MED ORDER — POLYETHYLENE GLYCOL 3350 17 G PO PACK
17.0000 g | PACK | Freq: Every day | ORAL | Status: DC
  Filled 2023-10-13: qty 1

## 2023-10-13 MED ORDER — LORAZEPAM 2 MG/ML IJ SOLN
2.0000 mg | Freq: Once | INTRAMUSCULAR | Status: AC
  Administered 2023-10-13: 2 mg via INTRAMUSCULAR
  Filled 2023-10-13: qty 1

## 2023-10-13 MED ORDER — ACETAMINOPHEN 650 MG RE SUPP: 650.0000 mg | Freq: Four times a day (QID) | RECTAL | Status: DC | PRN

## 2023-10-13 MED ORDER — HYDROXYZINE HCL 50 MG PO TABS
50.0000 mg | ORAL_TABLET | Freq: Every day | ORAL | Status: DC
  Filled 2023-10-13 (×4): qty 1

## 2023-10-13 MED ORDER — MIRTAZAPINE 15 MG PO TABS
7.5000 mg | ORAL_TABLET | Freq: Every day | ORAL | Status: DC
  Administered 2023-10-13 – 2023-10-15 (×2): 7.5 mg via ORAL
  Filled 2023-10-13 (×3): qty 1

## 2023-10-13 MED ORDER — BACLOFEN 10 MG PO TABS
15.0000 mg | ORAL_TABLET | Freq: Three times a day (TID) | ORAL | Status: DC
  Administered 2023-10-14 – 2023-10-16 (×6): 15 mg via ORAL
  Filled 2023-10-13 (×7): qty 2

## 2023-10-13 MED ORDER — FAMOTIDINE 20 MG PO TABS
20.0000 mg | ORAL_TABLET | Freq: Two times a day (BID) | ORAL | Status: DC
  Administered 2023-10-15 – 2023-10-16 (×3): 20 mg via ORAL
  Filled 2023-10-13 (×5): qty 1

## 2023-10-13 MED ORDER — LOSARTAN POTASSIUM 50 MG PO TABS
25.0000 mg | ORAL_TABLET | Freq: Every day | ORAL | Status: DC
  Administered 2023-10-13 – 2023-10-16 (×3): 25 mg via ORAL
  Filled 2023-10-13 (×4): qty 1

## 2023-10-13 MED ORDER — LORAZEPAM 2 MG/ML IJ SOLN: 1.0000 mg | Freq: Once | INTRAMUSCULAR | Status: DC

## 2023-10-13 MED ORDER — FENTANYL CITRATE PF 50 MCG/ML IJ SOSY: 50.0000 ug | PREFILLED_SYRINGE | Freq: Once | INTRAMUSCULAR | Status: DC

## 2023-10-13 MED ORDER — MORPHINE SULFATE (PF) 4 MG/ML IV SOLN
4.0000 mg | Freq: Once | INTRAVENOUS | Status: AC
  Administered 2023-10-13: 4 mg via INTRAMUSCULAR
  Filled 2023-10-13: qty 1

## 2023-10-13 MED ORDER — AMLODIPINE BESYLATE 10 MG PO TABS
10.0000 mg | ORAL_TABLET | Freq: Every day | ORAL | Status: DC
  Administered 2023-10-13 – 2023-10-16 (×3): 10 mg via ORAL
  Filled 2023-10-13 (×3): qty 1
  Filled 2023-10-13: qty 2

## 2023-10-13 MED ORDER — CARVEDILOL 6.25 MG PO TABS
6.2500 mg | ORAL_TABLET | Freq: Two times a day (BID) | ORAL | Status: DC
  Administered 2023-10-14 – 2023-10-15 (×3): 6.25 mg via ORAL
  Filled 2023-10-13 (×4): qty 1

## 2023-10-13 MED ORDER — SODIUM CHLORIDE 0.9 % IV SOLN: INTRAVENOUS | Status: AC

## 2023-10-13 MED ORDER — HYDRALAZINE HCL 25 MG PO TABS
25.0000 mg | ORAL_TABLET | Freq: Four times a day (QID) | ORAL | Status: DC
  Administered 2023-10-13 – 2023-10-15 (×6): 25 mg via ORAL
  Filled 2023-10-13 (×9): qty 1

## 2023-10-13 MED ORDER — IOHEXOL 350 MG/ML SOLN
75.0000 mL | Freq: Once | INTRAVENOUS | Status: AC | PRN
  Administered 2023-10-13: 75 mL via INTRAVENOUS

## 2023-10-13 MED ORDER — BUPROPION HCL 75 MG PO TABS
75.0000 mg | ORAL_TABLET | Freq: Two times a day (BID) | ORAL | Status: DC
  Administered 2023-10-15 – 2023-10-16 (×3): 75 mg via ORAL
  Filled 2023-10-13 (×6): qty 1

## 2023-10-13 MED ORDER — POLYETHYLENE GLYCOL 3350 17 G PO PACK: 17.0000 g | PACK | Freq: Every day | ORAL | Status: DC

## 2023-10-13 MED ORDER — BISACODYL 10 MG RE SUPP
10.0000 mg | Freq: Once | RECTAL | Status: AC
  Administered 2023-10-13: 10 mg via RECTAL
  Filled 2023-10-13: qty 1

## 2023-10-13 MED ORDER — APIXABAN 5 MG PO TABS
5.0000 mg | ORAL_TABLET | Freq: Two times a day (BID) | ORAL | Status: DC
  Administered 2023-10-15 – 2023-10-16 (×3): 5 mg via ORAL
  Filled 2023-10-13 (×5): qty 1

## 2023-10-13 MED ORDER — ACETAMINOPHEN 325 MG PO TABS
650.0000 mg | ORAL_TABLET | Freq: Four times a day (QID) | ORAL | Status: DC | PRN
Start: 2023-10-13 — End: 2023-10-16

## 2023-10-13 MED ORDER — SODIUM CHLORIDE 0.9 % IV SOLN
2.0000 g | Freq: Once | INTRAVENOUS | Status: AC
  Administered 2023-10-13: 2 g via INTRAVENOUS
  Filled 2023-10-13: qty 20

## 2023-10-13 MED ORDER — GABAPENTIN 100 MG PO CAPS
100.0000 mg | ORAL_CAPSULE | Freq: Two times a day (BID) | ORAL | Status: DC
  Administered 2023-10-15 – 2023-10-16 (×2): 100 mg via ORAL
  Filled 2023-10-13 (×5): qty 1

## 2023-10-13 MED ORDER — HALOPERIDOL LACTATE 5 MG/ML IJ SOLN
2.0000 mg | Freq: Once | INTRAMUSCULAR | Status: AC
  Administered 2023-10-13: 2 mg via INTRAMUSCULAR
  Filled 2023-10-13: qty 1

## 2023-10-13 NOTE — ED Notes (Signed)
Depend checked at time of medicine rounding.  Pt had not urinated or had a bowel movement.

## 2023-10-13 NOTE — ED Notes (Signed)
Pt will hold a conversation that makes grammatical sense, but at times makes sentences that do not make sense.

## 2023-10-13 NOTE — ED Notes (Signed)
Discussed haldol with Dr. Loney Loh due to Pt pulling her IVs out and taking her mitten off.

## 2023-10-13 NOTE — Plan of Care (Incomplete)
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  Staff at the facility told EMS that patient had clenched her jaw and refused to take her medications.  ED PA spoke to the patient's daughter who reported that at baseline patient is oriented, verbal, nonmobile, and incontinent.  Family saw the patient 2 days ago and at that time she was eating/drinking, conversant, and in no acute distress.  Per EMS, patient's roommate reported to them that patient was verbal yesterday.  Vital signs on arrival to the ED: Temperature 100.2 F, pulse 102, respiratory rate 22, blood pressure 160/125, and SpO2 100% on room air.  Labs showing no leukocytosis, glucose 126, creatinine 0.7, calcium 11.3, albumin 3.9, normal LFTs, COVID/influenza/RSV PCR negative, blood cultures collected, lactic acid normal.  CT head negative for acute intracranial abnormality.  Chest x-ray showing no acute cardiopulmonary abnormality.  CT abdomen pelvis showing small volume intraluminal air within the bladder which radiologist thinks could reflect recent instrumentation versus infection.  Also showing moderate to large rectal stool ball and large predominantly right-sided colonic stool burden.  Urinalysis done in the ED and not suggestive of infection. Patient was given ceftriaxone and morphine.  She was also given IM Ativan 2 mg for agitation.  TRH called to admit.  EKG: Sinus tachycardia, ST abnormality inferolaterally.  No significant change compared to previous tracing.

## 2023-10-13 NOTE — ED Notes (Signed)
Unable to obtain chest xray due to patient not cooperating with staff.

## 2023-10-13 NOTE — ED Notes (Addendum)
Pt. was resting.

## 2023-10-13 NOTE — ED Notes (Signed)
Patient is not able to relax or engage enough with staff to get blood work or start IV at this time. Did notify MD and PA of patients status. She was yelling, screaming and biting staff. Orders to give IM pain medication to patient to see if this will relax her to obtain labs and give antibiotics.

## 2023-10-13 NOTE — ED Notes (Signed)
Sarah Myers (Daughter) called asking for an update on Sarah Myers. Her number is [phone].

## 2023-10-13 NOTE — H&P (Signed)
History and Physical    [name]:[id number] DOB: Jan 08, 1971 DOA: 10/13/2023  PCP: Shayne Alken, MD  Chief Complaint: AMS  HPI: Sarah Myers is a 52 y.o. 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  Staff at the facility told EMS that patient had clenched her jaw and refused to take her medications.  ED PA spoke to the patient's daughter who reported that at baseline patient is oriented, verbal, nonmobile, and incontinent.  Family saw the patient 2 days ago and at that time she was eating/drinking, conversant, and in no acute distress.  Per EMS, patient's roommate reported to them that patient was verbal yesterday.  Vital signs on arrival to the ED: Temperature 100.2 F, pulse 102, respiratory rate 22, blood pressure 160/125, and SpO2 100% on room air.  Labs showing no leukocytosis, glucose 126, creatinine 0.7, calcium 11.3, albumin 3.9, normal LFTs, COVID/influenza/RSV PCR negative, blood cultures collected, lactic acid normal.  CT head negative for acute intracranial abnormality.  Chest x-ray showing no acute cardiopulmonary abnormality.  CT abdomen pelvis showing small volume intraluminal air within the bladder which radiologist thinks could reflect recent instrumentation versus infection.  Also showing moderate to large rectal stool ball and large predominantly right-sided colonic stool burden.  Urinalysis done in the ED and not suggestive of infection.  Patient required Ativan and Haldol in the ED for agitation.    Patient is confused and not able to give much history.  She is speaking now and able to tell me her name and that she is at a hospital.  She is able to tell me that she is not able to use her left upper and lower extremity due to history of previous  stroke.  Review of Systems:  Review of Systems  Reason unable to perform ROS: AMS.    Past Medical History:  Diagnosis Date   Hypertension     Past Surgical History:  Procedure Laterality Date   BREAST LUMPECTOMY  1992   CESAREAN SECTION     IR GASTROSTOMY TUBE MOD SED  03/06/2022   ORIF RADIAL FRACTURE  02/10/2012   Procedure: OPEN REDUCTION INTERNAL FIXATION (ORIF) RADIAL FRACTURE;  Surgeon: Cammy Copa, MD;  Location: [facility] OR;  Service: Orthopedics;  Laterality: Right;   ORIF ULNAR FRACTURE  02/10/2012   Procedure: OPEN REDUCTION INTERNAL FIXATION (ORIF) ULNAR FRACTURE;  Surgeon: Cammy Copa, MD;  Location: [facility] OR;  Service: Orthopedics;  Laterality: Right;   TUBAL LIGATION       reports that she has never smoked. She has never used smokeless tobacco. She reports that she does not currently use alcohol. She reports that she does not use drugs.  Allergies  Allergen Reactions   Zestril [Lisinopril] Cough    Family History  Problem Relation Age of Onset   Diabetes Mother    Hypertension Mother    Heart disease Mother    Diabetes Maternal Aunt    Stroke Maternal Uncle    Diabetes Maternal Grandmother    Heart disease Maternal Grandmother    Diabetes Maternal Grandfather    Heart disease Maternal Grandfather    Heart disease Paternal Grandmother    Heart disease Paternal Grandfather     Prior to Admission medications   Medication Sig Start Date End Date Taking? Authorizing Provider  acetaminophen (TYLENOL) 500  MG tablet Take 1,000 mg by mouth every 6 (six) hours as needed (pain).   Yes [provider]  Amantadine HCl 100 MG tablet Take 100 mg by mouth 2 (two) times daily. 09/15/22  Yes [provider]  amLODipine (NORVASC) 10 MG tablet Take 1 tablet (10 mg total) by mouth daily. 06/22/22  Yes Raulkar, Drema Pry, MD  apixaban (ELIQUIS) 5 MG TABS tablet Take 1 tablet (5 mg total) by mouth 2 (two) times daily. 06/22/22  Yes Raulkar, Drema Pry, MD   Baclofen 15 MG TABS Take 15 mg by mouth 3 (three) times daily.   Yes [provider]  buPROPion (WELLBUTRIN) 75 MG tablet Take 75 mg by mouth 2 (two) times daily.   Yes [provider]  carvedilol (COREG) 6.25 MG tablet Take 1 tablet (6.25 mg total) by mouth 2 (two) times daily with a meal. 06/22/22  Yes Raulkar, Drema Pry, MD  cholecalciferol (VITAMIN D3) 25 MCG (1000 UNIT) tablet Take 1,000 Units by mouth daily.   Yes [provider]  famotidine (PEPCID) 20 MG tablet Take 1 tablet (20 mg total) by mouth 2 (two) times daily. 05/22/22  Yes Raulkar, Drema Pry, MD  gabapentin (NEURONTIN) 100 MG capsule Take 1 capsule (100 mg total) by mouth 2 (two) times daily. 05/24/22  Yes Raulkar, Drema Pry, MD  hydrALAZINE (APRESOLINE) 25 MG tablet Take 25 mg by mouth every 6 (six) hours. 10/10/22  Yes [provider]  hydrOXYzine (VISTARIL) 50 MG capsule Take 50 mg by mouth at bedtime.   Yes [provider]  losartan (COZAAR) 25 MG tablet Take 1 tablet (25 mg total) by mouth daily. 06/09/22  Yes Raulkar, Drema Pry, MD  magnesium gluconate (MAGONATE) 500 MG tablet Take 1 tablet (500 mg total) by mouth at bedtime. 05/22/22  Yes Raulkar, Drema Pry, MD  mirtazapine (REMERON) 7.5 MG tablet Take 7.5 mg by mouth at bedtime.   Yes [provider]  Multiple Vitamin (MULTIVITAMIN WITH MINERALS) TABS tablet Take 1 tablet by mouth daily. 05/22/22  Yes Raulkar, Drema Pry, MD  Nutritional Supplements (NUTRITIONAL DRINK PO) Take 1 Container by mouth See admin instructions. 2 entries on MAR: 1) House shake, twice daily at 1000 and 1400 2) House supplement, three times daily at 1000, 1600, and 2200   Yes [provider]  Nutritional Supplements (NUTRITIONAL SUPPLEMENT PO) Take 1 each by mouth 2 (two) times daily. Frozen nutritional treat   Yes [provider]  ondansetron (ZOFRAN) 4 MG tablet Take 1 tablet (4 mg total) by mouth every 8 (eight) hours as needed for  nausea or vomiting. 10/17/22  Yes Tegeler, Canary Brim, MD  saccharomyces boulardii (FLORASTOR) 250 MG capsule Take 250 mg by mouth daily.   Yes [provider]    Physical Exam: Vitals:   10/13/23 1730 10/13/23 1744 10/13/23 1845 10/13/23 1930  BP: (!) 143/102  125/84 (!) 134/103  Pulse: 78  74 79  Resp: 18  15 (!) 21  Temp:  98.3 F (36.8 C)    TempSrc:  Rectal    SpO2: 100%  99% 100%    Physical Exam Vitals reviewed.  HENT:     Head: Normocephalic and atraumatic.  Eyes:     Extraocular Movements: Extraocular movements intact.  Cardiovascular:     Rate and Rhythm: Normal rate and regular rhythm.     Pulses: Normal pulses.  Pulmonary:     Effort: Pulmonary effort is normal. No respiratory distress.  Breath sounds: Normal breath sounds. No wheezing or rales.  Abdominal:     General: Bowel sounds are normal. There is no distension.     Palpations: Abdomen is soft.     Tenderness: There is no abdominal tenderness. There is no guarding.  Musculoskeletal:     Cervical back: Normal range of motion. No rigidity.     Right lower leg: No edema.     Left lower leg: No edema.  Skin:    General: Skin is warm and dry.  Neurological:     Mental Status: She is alert.     Comments: Left hemiplegia     Labs on Admission: I have personally reviewed following labs and imaging studies  CBC: Recent Labs  Lab 10/13/23 1624  WBC 6.4  NEUTROABS 4.5  HGB 13.4  HCT 40.8  MCV 83.6  PLT 228   Basic Metabolic Panel: Recent Labs  Lab 10/13/23 1624  NA 141  K 3.9  CL 107  CO2 23  GLUCOSE 126*  BUN 10  CREATININE 0.71  CALCIUM 11.3*   GFR: CrCl cannot be calculated (Unknown ideal weight.). Liver Function Tests: Recent Labs  Lab 10/13/23 1624  AST 21  ALT 18  ALKPHOS 82  BILITOT 0.5  PROT 7.6  ALBUMIN 3.9   No results for input(s): "LIPASE", "AMYLASE" in the last 168 hours. No results for input(s): "AMMONIA" in the last 168 hours. Coagulation  Profile: Recent Labs  Lab 10/13/23 1624  INR 1.2   Cardiac Enzymes: No results for input(s): "CKTOTAL", "CKMB", "CKMBINDEX", "TROPONINI" in the last 168 hours. BNP (last 3 results) No results for input(s): "PROBNP" in the last 8760 hours. HbA1C: No results for input(s): "HGBA1C" in the last 72 hours. CBG: No results for input(s): "GLUCAP" in the last 168 hours. Lipid Profile: No results for input(s): "CHOL", "HDL", "LDLCALC", "TRIG", "CHOLHDL", "LDLDIRECT" in the last 72 hours. Thyroid Function Tests: No results for input(s): "TSH", "T4TOTAL", "FREET4", "T3FREE", "THYROIDAB" in the last 72 hours. Anemia Panel: No results for input(s): "VITAMINB12", "FOLATE", "FERRITIN", "TIBC", "IRON", "RETICCTPCT" in the last 72 hours. Urine analysis:    Component Value Date/Time   COLORURINE YELLOW 10/13/2023 1624   APPEARANCEUR HAZY (A) 10/13/2023 1624   LABSPEC 1.020 10/13/2023 1624   PHURINE 6.5 10/13/2023 1624   GLUCOSEU NEGATIVE 10/13/2023 1624   HGBUR NEGATIVE 10/13/2023 1624   BILIRUBINUR NEGATIVE 10/13/2023 1624   KETONESUR 15 (A) 10/13/2023 1624   PROTEINUR NEGATIVE 10/13/2023 1624   UROBILINOGEN 1.0 09/20/2015 1448   NITRITE NEGATIVE 10/13/2023 1624   LEUKOCYTESUR NEGATIVE 10/13/2023 1624    Radiological Exams on Admission: CT ABDOMEN PELVIS W CONTRAST  Result Date: 10/13/2023 CLINICAL DATA:  Abdominal pain, acute, nonlocalized EXAM: CT ABDOMEN AND PELVIS WITH CONTRAST TECHNIQUE: Multidetector CT imaging of the abdomen and pelvis was performed using the standard protocol following bolus administration of intravenous contrast. RADIATION DOSE REDUCTION: This exam was performed according to the departmental dose-optimization program which includes automated exposure control, adjustment of the mA and/or kV according to patient size and/or use of iterative reconstruction technique. CONTRAST:  75mL OMNIPAQUE IOHEXOL 350 MG/ML SOLN COMPARISON:  February 05, 2023 FINDINGS: Evaluation is  limited by patient positioning and streak artifact. Lower chest: No acute abnormality. Hepatobiliary: No focal liver abnormality is seen. No gallstones, gallbladder wall thickening, or biliary dilatation. Pancreas: Unremarkable. No pancreatic ductal dilatation or surrounding inflammatory changes. Spleen: Normal in size without focal abnormality. Adrenals/Urinary Tract: Adrenal glands are unremarkable. Kidneys enhance symmetrically. No hydronephrosis. No  obstructing nephrolithiasis. Small volume intraluminal air within the bladder. Subjective bladder wall prominence for degree of distension. Stomach/Bowel: No evidence of bowel obstruction. Moderate to large rectal stool ball. Large predominantly RIGHT-sided colonic stool burden. Appendix is normal. Stomach is unremarkable. Vascular/Lymphatic: Aortic atherosclerosis. No enlarged abdominal or pelvic lymph nodes. Reproductive: Uterus and bilateral adnexa are unremarkable. Other: No free air or free fluid. Musculoskeletal: No acute or significant osseous findings. IMPRESSION: 1. Small volume intraluminal air within the bladder. This could reflect recent instrumentation versus infection. Recommend correlation with urinalysis. 2. Moderate to large rectal stool ball. Large predominantly RIGHT-sided colonic stool burden. Aortic Atherosclerosis (ICD10-I70.0). Electronically Signed   By: Meda Klinefelter M.D.   On: 10/13/2023 18:43   DG Chest Port 1 View  Result Date: 10/13/2023 CLINICAL DATA:  Questionable sepsis - evaluate for abnormality EXAM: PORTABLE CHEST 1 VIEW COMPARISON:  Apr 25, 2023 FINDINGS: The cardiomediastinal silhouette is unchanged in contour. No pleural effusion. No pneumothorax. No acute pleuroparenchymal abnormality. IMPRESSION: No acute cardiopulmonary abnormality. Electronically Signed   By: Meda Klinefelter M.D.   On: 10/13/2023 18:36   CT Head Wo Contrast  Result Date: 10/13/2023 CLINICAL DATA:  Mental status change EXAM: CT HEAD WITHOUT  CONTRAST TECHNIQUE: Contiguous axial images were obtained from the base of the skull through the vertex without intravenous contrast. RADIATION DOSE REDUCTION: This exam was performed according to the departmental dose-optimization program which includes automated exposure control, adjustment of the mA and/or kV according to patient size and/or use of iterative reconstruction technique. COMPARISON:  None Available. FINDINGS: Brain: No acute intracranial hemorrhage. No focal mass lesion. No CT evidence of acute infarction. No midline shift or mass effect. No hydrocephalus. Basilar cisterns are patent. Volume loss in the posteromedial LEFT parietal lobe and dilatation adjacent ventricle consistent with remote infarction. No change from prior. There are periventricular and subcortical white matter hypodensities. Generalized cortical atrophy. Vascular: No hyperdense vessel or unexpected calcification. Skull: Normal. Negative for fracture or focal lesion. Sinuses/Orbits: Paranasal sinuses and mastoid air cells are clear. Orbits are clear. Other: None. IMPRESSION: 1. No acute intracranial findings. 2. Remote LEFT parietal infarction. 3. Chronic microvascular ischemia. Electronically Signed   By: Genevive Bi M.D.   On: 10/13/2023 13:58    EKG: Independently reviewed. Sinus tachycardia, ST abnormality inferolaterally. No significant change compared to previous tracing.   Assessment and Plan  Altered mental status Patient with history of prior stroke/ICH with residual left hemiplegia and cognitive impairment.  Presenting from her nursing facility for elevation of altered mental status/agitation and was nonverbal over there today and refusing to take her medications.  Patient's roommate told EMS that she was verbal yesterday.  CT head negative for acute intracranial abnormality.  She did require Ativan and Haldol in the ED for agitation.  She is able to answer a few questions at this time but appears confused.   Temperature 100.2 F on arrival to the ED.  No leukocytosis on labs and no meningeal signs.  UA not suggestive of infection.  No other obvious infectious source at this time.  Blood cultures pending.  Brain MRI ordered.  Check TSH, B12, and ammonia level.  Hypercalcemia Dehydration could be contributing as patient confused and refusing p.o. intake at her facility.  Give IV fluid hydration and repeat labs in the morning.  Vitamin D supplement listed in her home medications.  Check vitamin D level.  Constipation Bowel regimen ordered.  Continue to monitor.  History of stroke/ICH with residual left hemiplegia and  cognitive impairment Mood disorder Continue baclofen, gabapentin, bupropion, mirtazapine, and hydroxyzine.  History of PE Continue Eliquis.  Hypertension Continue amlodipine, Coreg, losartan, and hydralazine.  GERD Continue Pepcid.  DVT prophylaxis: Eliquis Code Status: Full code by default.  At this time, patient does not have capacity for decision-making and no surrogate or prior directive available. Level of care: Telemetry bed Admission status: It is my clinical opinion that referral for OBSERVATION is reasonable and necessary in this patient based on the above information provided. The aforementioned taken together are felt to place the patient at high risk for further clinical deterioration. However, it is anticipated that the patient may be medically stable for discharge from the hospital within 24 to 48 hours.  John Giovanni MD [facility]  If 7PM-7AM, please contact night-coverage [email or web address]  10/13/2023, 8:27 PM

## 2023-10-13 NOTE — ED Triage Notes (Signed)
Patient BIB EMS from [facility] with an complaints of AMS. Nurse at facility was unable to tell EMS anything regarding patient. Unknown baseline for patient. All that was told to EMS was the she clenched her jaw and would not take her medications. 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   VS 186/111 102HR 100% on RA 131 CBG

## 2023-10-13 NOTE — ED Notes (Signed)
Unable to do the chest xray due to patient not rolling onto back or laying flat. Will update MD

## 2023-10-13 NOTE — ED Notes (Signed)
Pt had managed to pull mittens off and IVs out while oncoming paramedic was receiving report.  Pt was cleaned up.

## 2023-10-13 NOTE — ED Notes (Signed)
Pt continues to thrash around in the stretcher and attempted to bite RN when RN attempted take VS. MD notified and new orders received. Pt confused and not redirectable at this time.

## 2023-10-13 NOTE — ED Provider Notes (Signed)
[facility] AT [facility] Provider Note   CSN: [id number] Arrival date & time: 10/13/23  1207     History  Chief Complaint  Patient presents with   Altered Mental Status    Sarah Myers is a 52 y.o. female.  Here from nursing home with minimal history. EMS reporting altered mental status, refused to take her medications this morning. No reported vomiting or diarrhea. 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 Her baseline is oriented, verbal, non-mobile, incontinent. Sarah Myers saw the patient 2 days ago and reports she was eating/drinking, conversant, in NAD. Per EMS, roommate reported to them she was verbal yesterday.    Altered Mental Status      Home Medications Prior to Admission medications   Medication Sig Start Date End Date Taking? Authorizing Provider  acetaminophen (TYLENOL) 500 MG tablet Take 1,000 mg by mouth every 6 (six) hours as needed for moderate pain.    [provider]  Amantadine HCl 100 MG tablet Take 100 mg by mouth 2 (two) times daily. 09/15/22   [provider]  amLODipine (NORVASC) 10 MG tablet Take 1 tablet (10 mg total) by mouth daily. 06/22/22   Raulkar, Drema Pry, MD  apixaban (ELIQUIS) 5 MG TABS tablet Take 1 tablet (5 mg total) by mouth 2 (two) times daily. 06/22/22   Raulkar, Drema Pry, MD  ascorbic acid (VITAMIN C) 500 MG tablet Take 500 mg by mouth 2 (two) times daily.    [provider]  b complex vitamins capsule Take 1 capsule by mouth daily. For Covid    [provider]  baclofen (LIORESAL) 10 MG tablet Take 15 mg by mouth 2 (two) times daily. 06/13/22   [provider]  busPIRone (BUSPAR) 5 MG tablet Take 1 tablet (5 mg total) by mouth 3 (three) times daily as needed. 06/23/22   Raulkar, Drema Pry, MD  carvedilol (COREG) 6.25 MG tablet Take 1 tablet (6.25 mg total) by mouth 2 (two) times daily with a  meal. 06/22/22   Raulkar, Drema Pry, MD  cholecalciferol (VITAMIN D3) 25 MCG (1000 UNIT) tablet Take 1,000 Units by mouth daily.    [provider]  famotidine (PEPCID) 20 MG tablet Take 1 tablet (20 mg total) by mouth 2 (two) times daily. 05/22/22   Raulkar, Drema Pry, MD  gabapentin (NEURONTIN) 100 MG capsule Take 1 capsule (100 mg total) by mouth 2 (two) times daily. 05/24/22   Raulkar, Drema Pry, MD  hydrALAZINE (APRESOLINE) 25 MG tablet Take 25 mg by mouth every 6 (six) hours. 10/10/22   [provider]  hydrOXYzine (ATARAX) 10 MG tablet Take 1 tablet (10 mg total) by mouth 3 (three) times daily as needed. 06/09/22   Raulkar, Drema Pry, MD  losartan (COZAAR) 25 MG tablet Take 1 tablet (25 mg total) by mouth daily. 06/09/22   Raulkar, Drema Pry, MD  magnesium gluconate (MAGONATE) 500 MG tablet Take 1 tablet (500 mg total) by mouth at bedtime. 05/22/22   Raulkar, Drema Pry, MD  mirtazapine (REMERON) 7.5 MG tablet Take 7.5 mg by mouth at bedtime.    [provider]  Multiple Vitamin (MULTIVITAMIN WITH MINERALS) TABS tablet Take 1 tablet by mouth daily. 05/22/22   Raulkar, Drema Pry, MD  Multiple Vitamins-Minerals (ZINC PO) Take 1 tablet by mouth daily.    [provider]  Nutritional Supplements (ADULT NUTRITIONAL SUPPLEMENT PO) Take 1 Dose by mouth in  the morning and at bedtime. [employer] shake    [provider]  ondansetron (ZOFRAN) 4 MG tablet Take 1 tablet (4 mg total) by mouth every 8 (eight) hours as needed for nausea or vomiting. 10/17/22   Tegeler, Canary Brim, MD  QUEtiapine (SEROQUEL) 50 MG tablet Take 1 tablet (50 mg total) by mouth at bedtime. 12/12/22   Eligha Bridegroom, NP  saccharomyces boulardii (FLORASTOR) 250 MG capsule Take 250 mg by mouth daily.    [provider]  sertraline (ZOLOFT) 50 MG tablet Take 1 tablet (50 mg total) by mouth at bedtime. 06/22/22   Raulkar, Drema Pry, MD      Allergies    Lisinopril    Review of  Systems   Review of Systems  Physical Exam Updated Vital Signs BP (!) 134/103   Pulse 79   Temp 98.3 F (36.8 C) (Rectal)   Resp (!) 21   SpO2 100%  Physical Exam Constitutional:      Comments: Awake, no verbal communication  HENT:     Head: Atraumatic.  Eyes:     Pupils: Pupils are equal, round, and reactive to light.  Cardiovascular:     Heart sounds: No murmur heard.    Comments: Mildly tachycardic 102. Pulmonary:     Comments: Poor effort. No wheezing. Abdominal:     Palpations: Abdomen is soft.  Musculoskeletal:     Cervical back: Normal range of motion and neck supple.     Comments: Left hemiplegia. Moves right arm, 3/5 grip strength. Resists leg manipulation.  Skin:    General: Skin is warm and dry.     Findings: No bruising, erythema or lesion.  Neurological:     Comments: Patient will follow command. No eye contact. Non-verbal. Appears restless. Left hemiplegia, chronic. There is lateral nystagmus present.     ED Results / Procedures / Treatments   Labs (all labs ordered are listed, but only abnormal results are displayed) Labs Reviewed  COMPREHENSIVE METABOLIC PANEL - Abnormal; Notable for the following components:      Result Value   Glucose, Bld 126 (*)    Calcium 11.3 (*)    All other components within normal limits  URINALYSIS, W/ REFLEX TO CULTURE (INFECTION SUSPECTED) - Abnormal; Notable for the following components:   APPearance HAZY (*)    Ketones, ur 15 (*)    Bacteria, UA FEW (*)    All other components within normal limits  RESP PANEL BY RT-PCR (RSV, FLU A&B, COVID)  [id number]  CULTURE, BLOOD (ROUTINE X 2)  CULTURE, BLOOD (ROUTINE X 2)  CBC WITH DIFFERENTIAL/PLATELET  PROTIME-INR  APTT  I-STAT CG4 LACTIC ACID, ED   Results for orders placed or performed during the hospital encounter of 10/13/23  Resp panel by RT-PCR (RSV, Flu A&B, Covid) Anterior Nasal Swab   Specimen: Anterior Nasal Swab  Result Value Ref Range   SARS Coronavirus 2 by  RT PCR NEGATIVE NEGATIVE   Influenza A by PCR NEGATIVE NEGATIVE   Influenza B by PCR NEGATIVE NEGATIVE   Resp Syncytial Virus by PCR NEGATIVE NEGATIVE  Comprehensive metabolic panel  Result Value Ref Range   Sodium 141 135 - 145 mmol/L   Potassium 3.9 3.5 - 5.1 mmol/L   Chloride 107 98 - 111 mmol/L   CO2 23 22 - 32 mmol/L   Glucose, Bld 126 (H) 70 - 99 mg/dL   BUN 10 6 - 20 mg/dL   Creatinine, Ser 1.61 0.44 - 1.00 mg/dL  Calcium 11.3 (H) 8.9 - 10.3 mg/dL   Total Protein 7.6 6.5 - 8.1 g/dL   Albumin 3.9 3.5 - 5.0 g/dL   AST 21 15 - 41 U/L   ALT 18 0 - 44 U/L   Alkaline Phosphatase 82 38 - 126 U/L   Total Bilirubin 0.5 0.3 - 1.2 mg/dL   GFR, Estimated >28 >41 mL/min   Anion gap 11 5 - 15  CBC with Differential  Result Value Ref Range   WBC 6.4 4.0 - 10.5 K/uL   RBC 4.88 3.87 - 5.11 MIL/uL   Hemoglobin 13.4 12.0 - 15.0 g/dL   HCT 32.4 40.1 - 02.7 %   MCV 83.6 80.0 - 100.0 fL   MCH 27.5 26.0 - 34.0 pg   MCHC 32.8 30.0 - 36.0 g/dL   RDW 25.3 66.4 - 40.3 %   Platelets 228 150 - 400 K/uL   nRBC 0.0 0.0 - 0.2 %   Neutrophils Relative % 69 %   Neutro Abs 4.5 1.7 - 7.7 K/uL   Lymphocytes Relative 25 %   Lymphs Abs 1.6 0.7 - 4.0 K/uL   Monocytes Relative 5 %   Monocytes Absolute 0.3 0.1 - 1.0 K/uL   Eosinophils Relative 0 %   Eosinophils Absolute 0.0 0.0 - 0.5 K/uL   Basophils Relative 1 %   Basophils Absolute 0.1 0.0 - 0.1 K/uL   Immature Granulocytes 0 %   Abs Immature Granulocytes 0.02 0.00 - 0.07 K/uL  Protime-INR  Result Value Ref Range   Prothrombin Time 15.2 11.4 - 15.2 seconds   INR 1.2 0.8 - 1.2  APTT  Result Value Ref Range   aPTT 34 24 - 36 seconds  Urinalysis, w/ Reflex to Culture (Infection Suspected) -Urine, Catheterized  Result Value Ref Range   Specimen Source URINE, CATHETERIZED    Color, Urine YELLOW YELLOW   APPearance HAZY (A) CLEAR   Specific Gravity, Urine 1.020 1.005 - 1.030   pH 6.5 5.0 - 8.0   Glucose, UA NEGATIVE NEGATIVE mg/dL   Hgb urine  dipstick NEGATIVE NEGATIVE   Bilirubin Urine NEGATIVE NEGATIVE   Ketones, ur 15 (A) NEGATIVE mg/dL   Protein, ur NEGATIVE NEGATIVE mg/dL   Nitrite NEGATIVE NEGATIVE   Leukocytes,Ua NEGATIVE NEGATIVE   Squamous Epithelial / HPF 11-20 0 - 5 /HPF   WBC, UA 0-5 0 - 5 WBC/hpf   RBC / HPF 0-5 0 - 5 RBC/hpf   Bacteria, UA FEW (A) NONE SEEN   Urine-Other LESS THAN 10 mL OF URINE SUBMITTED   I-Stat Lactic Acid, ED  Result Value Ref Range   Lactic Acid, Venous 1.3 0.5 - 1.9 mmol/L    EKG EKG Interpretation Date/Time:  Saturday October 13 2023 12:35:53 EDT Ventricular Rate:  103 PR Interval:  180 QRS Duration:  111 QT Interval:  333 QTC Calculation: 436 R Axis:   118  Text Interpretation: Sinus tachycardia Consider right atrial enlargement Similar in morphology to previous EKGs Confirmed by Beckey Downing [phone]) on 10/13/2023 12:40:24 PM  Radiology CT ABDOMEN PELVIS W CONTRAST  Result Date: 10/13/2023 CLINICAL DATA:  Abdominal pain, acute, nonlocalized EXAM: CT ABDOMEN AND PELVIS WITH CONTRAST TECHNIQUE: Multidetector CT imaging of the abdomen and pelvis was performed using the standard protocol following bolus administration of intravenous contrast. RADIATION DOSE REDUCTION: This exam was performed according to the departmental dose-optimization program which includes automated exposure control, adjustment of the mA and/or kV according to patient size and/or use of iterative reconstruction technique. CONTRAST:  75mL OMNIPAQUE IOHEXOL 350 MG/ML SOLN COMPARISON:  February 05, 2023 FINDINGS: Evaluation is limited by patient positioning and streak artifact. Lower chest: No acute abnormality. Hepatobiliary: No focal liver abnormality is seen. No gallstones, gallbladder wall thickening, or biliary dilatation. Pancreas: Unremarkable. No pancreatic ductal dilatation or surrounding inflammatory changes. Spleen: Normal in size without focal abnormality. Adrenals/Urinary Tract: Adrenal glands are  unremarkable. Kidneys enhance symmetrically. No hydronephrosis. No obstructing nephrolithiasis. Small volume intraluminal air within the bladder. Subjective bladder wall prominence for degree of distension. Stomach/Bowel: No evidence of bowel obstruction. Moderate to large rectal stool ball. Large predominantly RIGHT-sided colonic stool burden. Appendix is normal. Stomach is unremarkable. Vascular/Lymphatic: Aortic atherosclerosis. No enlarged abdominal or pelvic lymph nodes. Reproductive: Uterus and bilateral adnexa are unremarkable. Other: No free air or free fluid. Musculoskeletal: No acute or significant osseous findings. IMPRESSION: 1. Small volume intraluminal air within the bladder. This could reflect recent instrumentation versus infection. Recommend correlation with urinalysis. 2. Moderate to large rectal stool ball. Large predominantly RIGHT-sided colonic stool burden. Aortic Atherosclerosis (ICD10-I70.0). Electronically Signed   By: Meda Klinefelter M.D.   On: 10/13/2023 18:43   DG Chest Port 1 View  Result Date: 10/13/2023 CLINICAL DATA:  Questionable sepsis - evaluate for abnormality EXAM: PORTABLE CHEST 1 VIEW COMPARISON:  Apr 25, 2023 FINDINGS: The cardiomediastinal silhouette is unchanged in contour. No pleural effusion. No pneumothorax. No acute pleuroparenchymal abnormality. IMPRESSION: No acute cardiopulmonary abnormality. Electronically Signed   By: Meda Klinefelter M.D.   On: 10/13/2023 18:36   CT Head Wo Contrast  Result Date: 10/13/2023 CLINICAL DATA:  Mental status change EXAM: CT HEAD WITHOUT CONTRAST TECHNIQUE: Contiguous axial images were obtained from the base of the skull through the vertex without intravenous contrast. RADIATION DOSE REDUCTION: This exam was performed according to the departmental dose-optimization program which includes automated exposure control, adjustment of the mA and/or kV according to patient size and/or use of iterative reconstruction technique.  COMPARISON:  None Available. FINDINGS: Brain: No acute intracranial hemorrhage. No focal mass lesion. No CT evidence of acute infarction. No midline shift or mass effect. No hydrocephalus. Basilar cisterns are patent. Volume loss in the posteromedial LEFT parietal lobe and dilatation adjacent ventricle consistent with remote infarction. No change from prior. There are periventricular and subcortical white matter hypodensities. Generalized cortical atrophy. Vascular: No hyperdense vessel or unexpected calcification. Skull: Normal. Negative for fracture or focal lesion. Sinuses/Orbits: Paranasal sinuses and mastoid air cells are clear. Orbits are clear. Other: None. IMPRESSION: 1. No acute intracranial findings. 2. Remote LEFT parietal infarction. 3. Chronic microvascular ischemia. Electronically Signed   By: Genevive Bi M.D.   On: 10/13/2023 13:58    Procedures Procedures    Medications Ordered in ED Medications  LORazepam (ATIVAN) injection 1 mg (0 mg Intravenous Hold 10/13/23 1705)  fentaNYL (SUBLIMAZE) injection 50 mcg (0 mcg Intravenous Hold 10/13/23 1705)  haloperidol lactate (HALDOL) injection 2 mg (0 mg Intramuscular Hold 10/13/23 1706)  cefTRIAXone (ROCEPHIN) 2 g in sodium chloride 0.9 % 100 mL IVPB (0 g Intravenous Stopped 10/13/23 1837)  morphine (PF) 4 MG/ML injection 4 mg (4 mg Intramuscular Given 10/13/23 1430)  LORazepam (ATIVAN) injection 2 mg (2 mg Intramuscular Given 10/13/23 1538)  iohexol (OMNIPAQUE) 350 MG/ML injection 75 mL (75 mLs Intravenous Contrast Given 10/13/23 1804)    ED Course/ Medical Decision Making/ A&P  Medical Decision Making This patient presents to the ED for concern of AMS, this involves an extensive number of treatment options, and is a complaint that carries with it a high risk of complications and morbidity.  The differential diagnosis includes CVA, sepsis, Bleed, encephalopathy   Co morbidities that complicate the  patient evaluation  Previous CVA, HTN   Additional history obtained:  Additional history obtained from Daughter, Sarah Myers  Lab Tests:  I Ordered, and personally interpreted labs.  The pertinent results include:  No leukocytosis, normal hgb, UA without evidence infection, normal lactic acid, no electrolyte abnormalities, viral panel negative.    Imaging Studies ordered:  I ordered imaging studies including CT head, CT abd/pel, CXR  radiologist interpretation: Head CT - no acute findings CT abd/pel - c/w constipation CXR - no infiltrate, consolidation, edema   Cardiac Monitoring: / EKG:  The patient was maintained on a cardiac monitor.  I personally viewed and interpreted the cardiac monitored which showed an underlying rhythm of: sinus tachycardia, 103    Problem List / ED Course / Critical interventions / Medication management  On recheck (13:20) the patient appeared to be in distress. She was able to indicate her leg was in pain. It was found caught in the bed rail. No swelling. Easily moved to comfortable position.   CT head without acute finding. Continue work up for sepsis.  Patient is combative with attempt at IV start and blood draw. IM medication considered. Morphine 4 mg given for possible pain causing agitation. No improvement on recheck.   IM Ativan given with improvement. The patient is more calm. Blood for lab testing obtained, IV started. Will attempt to collect urine by  in-and-out catheter.   Urine collected - negative for infection  Recheck - the patient is somnolent, now speaking with delayed response. Does not remember presenting to ED tonight. Reports back pain and left arm and leg pain, chronic.   The patient has been seen by Dr. Rhae Hammock who participated in her care and evaluation. Also discussed her condition and history with her daughter, Sarah Myers. Feel the patient has not returned to baseline mental status. Unexplained cause. Cultures pending.   I ordered  medication including Ativan  for agitation, Morphine for pain  Reevaluation of the patient after these medicines showed that the patient improved I have reviewed the patients home medicines and have made adjustments as needed   Social Determinants of Health:  Nursing home resident, immobile due to previous stroke and left hemiplegia   Test / Admission - Considered:  Feel the patient would benefit from observation to return to baseline mental status, review of blood cultures. Hospitalist paged.     Amount and/or Complexity of Data Reviewed Labs: ordered. Radiology: ordered. ECG/medicine tests: ordered.  Risk Prescription drug management.           Final Clinical Impression(s) / ED Diagnoses Final diagnoses:  Delirium    Rx / DC Orders ED Discharge Orders     None         Danne Harbor 10/13/23 1951    Durwin Glaze, MD 10/13/23 2008

## 2023-10-13 NOTE — ED Notes (Addendum)
ED TO INPATIENT HANDOFF REPORT  ED Nurse Name and Phone #: Sena Slate  S Name/Age/Gender Sarah Myers 52 y.o. female Room/Bed: 001C/001C  Code Status   Code Status: Full Code  Home/SNF/Other Nursing Home Patient oriented to: self Is this baseline? No , but baseline is not A+O4 according to EMS  Triage Complete: Triage complete  Chief Complaint AMS (altered mental status) [R41.82]  Triage Note Patient BIB EMS from [facility] with an complaints of AMS. Nurse at facility was unable to tell EMS anything regarding patient. Unknown baseline for patient. All that was told to EMS was the she clenched her jaw and would not take her medications. 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   VS 186/111 102HR 100% on RA 131 CBG   Allergies Allergies  Allergen Reactions   Zestril [Lisinopril] Cough    Level of Care/Admitting Diagnosis ED Disposition     ED Disposition  Admit   Condition  --   Comment  Hospital Area: [facility] [[id number]]  Level of Care: Telemetry Medical [104]  May place patient in observation at [facility] or [facility] if equivalent level of care is available:: Yes  Covid Evaluation: Asymptomatic - no recent exposure (last 10 days) testing not required  Diagnosis: AMS (altered mental status) [[id number]]  Admitting Physician: John Giovanni [[id number]]  Attending Physician: John Giovanni [[id number]]          B Medical/Surgery History Past Medical History:  Diagnosis Date   Hypertension    Past Surgical History:  Procedure Laterality Date   BREAST LUMPECTOMY  1992   CESAREAN SECTION     IR GASTROSTOMY TUBE MOD SED  03/06/2022   ORIF RADIAL FRACTURE  02/10/2012   Procedure: OPEN REDUCTION INTERNAL FIXATION (ORIF) RADIAL FRACTURE;  Surgeon: Cammy Copa, MD;  Location: [facility] OR;  Service: Orthopedics;  Laterality: Right;   ORIF ULNAR FRACTURE  02/10/2012   Procedure: OPEN REDUCTION  INTERNAL FIXATION (ORIF) ULNAR FRACTURE;  Surgeon: Cammy Copa, MD;  Location: [facility] OR;  Service: Orthopedics;  Laterality: Right;   TUBAL LIGATION       A IV Location/Drains/Wounds Patient Lines/Drains/Airways Status     Active Line/Drains/Airways     Name Placement date Placement time Site Days   Peripheral IV 10/13/23 20 G Posterior;Right Hand 10/13/23  2005  Hand  less than 1            Intake/Output Last 24 hours  Intake/Output Summary (Last 24 hours) at 10/13/2023 2212 Last data filed at 10/13/2023 1837 Gross per 24 hour  Intake 100 ml  Output --  Net 100 ml    Labs/Imaging Results for orders placed or performed during the hospital encounter of 10/13/23 (from the past 48 hour(s))  Resp panel by RT-PCR (RSV, Flu A&B, Covid) Anterior Nasal Swab     Status: None   Collection Time: 10/13/23 12:28 PM   Specimen: Anterior Nasal Swab  Result Value Ref Range   SARS Coronavirus 2 by RT PCR NEGATIVE NEGATIVE   Influenza A by PCR NEGATIVE NEGATIVE   Influenza B by PCR NEGATIVE NEGATIVE    Comment: (NOTE) The Xpert Xpress SARS-CoV-2/FLU/RSV plus assay is intended as an aid in the diagnosis of influenza from Nasopharyngeal swab specimens and should not be used as a sole basis for treatment. Nasal washings and aspirates are unacceptable for Xpert Xpress SARS-CoV-2/FLU/RSV testing.  Fact Sheet for Patients: BloggerCourse.com  Fact Sheet  for Healthcare Providers: SeriousBroker.it  This test is not yet approved or cleared by the [place] and has been authorized for detection and/or diagnosis of SARS-CoV-2 by FDA under an Emergency Use Authorization (EUA). This EUA will remain in effect (meaning this test can be used) for the duration of the COVID-19 declaration under Section 564(b)(1) of the Act, 21 U.S.C. section 360bbb-3(b)(1), unless the authorization is terminated or revoked.     Resp Syncytial Virus  by PCR NEGATIVE NEGATIVE    Comment: (NOTE) Fact Sheet for Patients: BloggerCourse.com  Fact Sheet for Healthcare Providers: SeriousBroker.it  This test is not yet approved or cleared by the [place] FDA and has been authorized for detection and/or diagnosis of SARS-CoV-2 by FDA under an Emergency Use Authorization (EUA). This EUA will remain in effect (meaning this test can be used) for the duration of the COVID-19 declaration under Section 564(b)(1) of the Act, 21 U.S.C. section 360bbb-3(b)(1), unless the authorization is terminated or revoked.  Performed at [facility], [address] [place], [place] [address]   Comprehensive metabolic panel     Status: Abnormal   Collection Time: 10/13/23  4:24 PM  Result Value Ref Range   Sodium 141 135 - 145 mmol/L   Potassium 3.9 3.5 - 5.1 mmol/L   Chloride 107 98 - 111 mmol/L   CO2 23 22 - 32 mmol/L   Glucose, Bld 126 (H) 70 - 99 mg/dL    Comment: Glucose reference range applies only to samples taken after fasting for at least 8 hours.   BUN 10 6 - 20 mg/dL   Creatinine, Ser 6.96 0.44 - 1.00 mg/dL   Calcium 29.5 (H) 8.9 - 10.3 mg/dL   Total Protein 7.6 6.5 - 8.1 g/dL   Albumin 3.9 3.5 - 5.0 g/dL   AST 21 15 - 41 U/L   ALT 18 0 - 44 U/L   Alkaline Phosphatase 82 38 - 126 U/L   Total Bilirubin 0.5 0.3 - 1.2 mg/dL   GFR, Estimated >28 >41 mL/min    Comment: (NOTE) Calculated using the CKD-EPI Creatinine Equation (2021)    Anion gap 11 5 - 15    Comment: Performed at [facility], [address], [place], [place] [address]  CBC with Differential     Status: None   Collection Time: 10/13/23  4:24 PM  Result Value Ref Range   WBC 6.4 4.0 - 10.5 K/uL   RBC 4.88 3.87 - 5.11 MIL/uL   Hemoglobin 13.4 12.0 - 15.0 g/dL   HCT 10.2 72.5 - 36.6 %   MCV 83.6 80.0 - 100.0 fL   MCH 27.5 26.0 - 34.0 pg   MCHC 32.8 30.0 - 36.0 g/dL   RDW 44.0 34.7 - 42.5 %   Platelets 228  150 - 400 K/uL   nRBC 0.0 0.0 - 0.2 %   Neutrophils Relative % 69 %   Neutro Abs 4.5 1.7 - 7.7 K/uL   Lymphocytes Relative 25 %   Lymphs Abs 1.6 0.7 - 4.0 K/uL   Monocytes Relative 5 %   Monocytes Absolute 0.3 0.1 - 1.0 K/uL   Eosinophils Relative 0 %   Eosinophils Absolute 0.0 0.0 - 0.5 K/uL   Basophils Relative 1 %   Basophils Absolute 0.1 0.0 - 0.1 K/uL   Immature Granulocytes 0 %   Abs Immature Granulocytes 0.02 0.00 - 0.07 K/uL    Comment: Performed at [facility], [address]  [place], [place] [address]  Protime-INR     Status: None   Collection Time: 10/13/23  4:24 PM  Result Value Ref Range   Prothrombin Time 15.2 11.4 - 15.2 seconds   INR 1.2 0.8 - 1.2    Comment: (NOTE) INR goal varies based on device and disease states. Performed at [facility], [address], [place], [place] [address]   APTT     Status: None   Collection Time: 10/13/23  4:24 PM  Result Value Ref Range   aPTT 34 24 - 36 seconds    Comment: Performed at [facility], [address], [place], [place] [address]  Urinalysis, w/ Reflex to Culture (Infection Suspected) -Urine, Catheterized     Status: Abnormal   Collection Time: 10/13/23  4:24 PM  Result Value Ref Range   Specimen Source URINE, CATHETERIZED    Color, Urine YELLOW YELLOW   APPearance HAZY (A) CLEAR   Specific Gravity, Urine 1.020 1.005 - 1.030   pH 6.5 5.0 - 8.0   Glucose, UA NEGATIVE NEGATIVE mg/dL   Hgb urine dipstick NEGATIVE NEGATIVE   Bilirubin Urine NEGATIVE NEGATIVE   Ketones, ur 15 (A) NEGATIVE mg/dL   Protein, ur NEGATIVE NEGATIVE mg/dL   Nitrite NEGATIVE NEGATIVE   Leukocytes,Ua NEGATIVE NEGATIVE   Squamous Epithelial / HPF 11-20 0 - 5 /HPF   WBC, UA 0-5 0 - 5 WBC/hpf    Comment: Reflex urine culture not performed if WBC <=10, OR if Squamous epithelial cells >5. If Squamous epithelial cells >5, suggest recollection.   RBC / HPF 0-5 0 - 5 RBC/hpf   Bacteria, UA FEW (A) NONE SEEN   Urine-Other  LESS THAN 10 mL OF URINE SUBMITTED     Comment: MICROSCOPIC EXAM PERFORMED ON UNCONCENTRATED URINE Performed at [facility], [address], [place], [place] [address]   I-Stat Lactic Acid, ED     Status: None   Collection Time: 10/13/23  4:39 PM  Result Value Ref Range   Lactic Acid, Venous 1.3 0.5 - 1.9 mmol/L   CT ABDOMEN PELVIS W CONTRAST  Result Date: 10/13/2023 CLINICAL DATA:  Abdominal pain, acute, nonlocalized EXAM: CT ABDOMEN AND PELVIS WITH CONTRAST TECHNIQUE: Multidetector CT imaging of the abdomen and pelvis was performed using the standard protocol following bolus administration of intravenous contrast. RADIATION DOSE REDUCTION: This exam was performed according to the departmental dose-optimization program which includes automated exposure control, adjustment of the mA and/or kV according to patient size and/or use of iterative reconstruction technique. CONTRAST:  75mL OMNIPAQUE IOHEXOL 350 MG/ML SOLN COMPARISON:  February 05, 2023 FINDINGS: Evaluation is limited by patient positioning and streak artifact. Lower chest: No acute abnormality. Hepatobiliary: No focal liver abnormality is seen. No gallstones, gallbladder wall thickening, or biliary dilatation. Pancreas: Unremarkable. No pancreatic ductal dilatation or surrounding inflammatory changes. Spleen: Normal in size without focal abnormality. Adrenals/Urinary Tract: Adrenal glands are unremarkable. Kidneys enhance symmetrically. No hydronephrosis. No obstructing nephrolithiasis. Small volume intraluminal air within the bladder. Subjective bladder wall prominence for degree of distension. Stomach/Bowel: No evidence of bowel obstruction. Moderate to large rectal stool ball. Large predominantly RIGHT-sided colonic stool burden. Appendix is normal. Stomach is unremarkable. Vascular/Lymphatic: Aortic atherosclerosis. No enlarged abdominal or pelvic lymph nodes. Reproductive: Uterus and bilateral adnexa are unremarkable. Other: No free  air or free fluid. Musculoskeletal: No acute or significant osseous findings. IMPRESSION: 1. Small volume intraluminal air within the bladder. This could reflect recent instrumentation versus infection. Recommend correlation with  urinalysis. 2. Moderate to large rectal stool ball. Large predominantly RIGHT-sided colonic stool burden. Aortic Atherosclerosis (ICD10-I70.0). Electronically Signed   By: Meda Klinefelter M.D.   On: 10/13/2023 18:43   DG Chest Port 1 View  Result Date: 10/13/2023 CLINICAL DATA:  Questionable sepsis - evaluate for abnormality EXAM: PORTABLE CHEST 1 VIEW COMPARISON:  Apr 25, 2023 FINDINGS: The cardiomediastinal silhouette is unchanged in contour. No pleural effusion. No pneumothorax. No acute pleuroparenchymal abnormality. IMPRESSION: No acute cardiopulmonary abnormality. Electronically Signed   By: Meda Klinefelter M.D.   On: 10/13/2023 18:36   CT Head Wo Contrast  Result Date: 10/13/2023 CLINICAL DATA:  Mental status change EXAM: CT HEAD WITHOUT CONTRAST TECHNIQUE: Contiguous axial images were obtained from the base of the skull through the vertex without intravenous contrast. RADIATION DOSE REDUCTION: This exam was performed according to the departmental dose-optimization program which includes automated exposure control, adjustment of the mA and/or kV according to patient size and/or use of iterative reconstruction technique. COMPARISON:  None Available. FINDINGS: Brain: No acute intracranial hemorrhage. No focal mass lesion. No CT evidence of acute infarction. No midline shift or mass effect. No hydrocephalus. Basilar cisterns are patent. Volume loss in the posteromedial LEFT parietal lobe and dilatation adjacent ventricle consistent with remote infarction. No change from prior. There are periventricular and subcortical white matter hypodensities. Generalized cortical atrophy. Vascular: No hyperdense vessel or unexpected calcification. Skull: Normal. Negative for fracture  or focal lesion. Sinuses/Orbits: Paranasal sinuses and mastoid air cells are clear. Orbits are clear. Other: None. IMPRESSION: 1. No acute intracranial findings. 2. Remote LEFT parietal infarction. 3. Chronic microvascular ischemia. Electronically Signed   By: Genevive Bi M.D.   On: 10/13/2023 13:58    Pending Labs Unresulted Labs (From admission, onward)     Start     Ordered   10/14/23 0500  Comprehensive metabolic panel  Tomorrow morning,   R        10/13/23 2047   10/13/23 2047  TSH  Once,   R        10/13/23 2047   10/13/23 2047  Vitamin B12  Once,   R        10/13/23 2047   10/13/23 2047  Ammonia  Once,   R        10/13/23 2047   10/13/23 2047  VITAMIN D 25 Hydroxy (Vit-D Deficiency, Fractures)  Once,   R        10/13/23 2047   10/13/23 2044  HIV Antibody (routine testing w rflx)  (HIV Antibody (Routine testing w reflex) panel)  Once,   R        10/13/23 2047   10/13/23 1227  Blood Culture (routine x 2)  (Undifferentiated presentation (screening labs and basic nursing orders))  BLOOD CULTURE X 2,   STAT      10/13/23 1228            Vitals/Pain Today's Vitals   10/13/23 1744 10/13/23 1845 10/13/23 1930 10/13/23 2147  BP:  125/84 (!) 134/103 (!) 125/110  Pulse:  74 79 71  Resp:  15 (!) 21 18  Temp: 98.3 F (36.8 C)   98.7 F (37.1 C)  TempSrc: Rectal   Oral  SpO2:  99% 100% 97%    Isolation Precautions No active isolations  Medications Medications  acetaminophen (TYLENOL) tablet 650 mg (has no administration in time range)    Or  acetaminophen (TYLENOL) suppository 650 mg (has no administration in time range)  0.9 %  sodium chloride infusion ( Intravenous New Bag/Given 10/13/23 2120)  amLODipine (NORVASC) tablet 10 mg (10 mg Oral Given 10/13/23 2113)  apixaban (ELIQUIS) tablet 5 mg (has no administration in time range)  baclofen (LIORESAL) tablet 15 mg (has no administration in time range)  buPROPion (WELLBUTRIN) tablet 75 mg (has no administration in  time range)  carvedilol (COREG) tablet 6.25 mg (has no administration in time range)  famotidine (PEPCID) tablet 20 mg (has no administration in time range)  gabapentin (NEURONTIN) capsule 100 mg (has no administration in time range)  hydrALAZINE (APRESOLINE) tablet 25 mg (25 mg Oral Given 10/13/23 2113)  hydrOXYzine (VISTARIL) capsule 50 mg (has no administration in time range)  losartan (COZAAR) tablet 25 mg (25 mg Oral Given 10/13/23 2113)  mirtazapine (REMERON) tablet 7.5 mg (7.5 mg Oral Given 10/13/23 2126)  polyethylene glycol (MIRALAX / GLYCOLAX) packet 17 g (has no administration in time range)  cefTRIAXone (ROCEPHIN) 2 g in sodium chloride 0.9 % 100 mL IVPB (0 g Intravenous Stopped 10/13/23 1837)  morphine (PF) 4 MG/ML injection 4 mg (4 mg Intramuscular Given 10/13/23 1430)  LORazepam (ATIVAN) injection 2 mg (2 mg Intramuscular Given 10/13/23 1538)  haloperidol lactate (HALDOL) injection 2 mg (2 mg Intramuscular Given 10/13/23 2022)  iohexol (OMNIPAQUE) 350 MG/ML injection 75 mL (75 mLs Intravenous Contrast Given 10/13/23 1804)  bisacodyl (DULCOLAX) suppository 10 mg (10 mg Rectal Given 10/13/23 2113)    Mobility non-ambulatory     Focused Assessments     R Recommendations: See Admitting Provider Note  Report given to:   Additional Notes: Pt's daughter is Daleysha Hubers [phone]. She likes pretty constant updates.

## 2023-10-14 DIAGNOSIS — Z7901 Long term (current) use of anticoagulants: Secondary | ICD-10-CM | POA: Diagnosis not present

## 2023-10-14 DIAGNOSIS — R4182 Altered mental status, unspecified: Secondary | ICD-10-CM

## 2023-10-14 DIAGNOSIS — I69319 Unspecified symptoms and signs involving cognitive functions following cerebral infarction: Secondary | ICD-10-CM | POA: Diagnosis not present

## 2023-10-14 DIAGNOSIS — Z1152 Encounter for screening for COVID-19: Secondary | ICD-10-CM | POA: Diagnosis not present

## 2023-10-14 DIAGNOSIS — G9340 Encephalopathy, unspecified: Secondary | ICD-10-CM | POA: Diagnosis not present

## 2023-10-14 DIAGNOSIS — Z833 Family history of diabetes mellitus: Secondary | ICD-10-CM | POA: Diagnosis not present

## 2023-10-14 DIAGNOSIS — Z888 Allergy status to other drugs, medicaments and biological substances status: Secondary | ICD-10-CM | POA: Diagnosis not present

## 2023-10-14 DIAGNOSIS — F39 Unspecified mood [affective] disorder: Secondary | ICD-10-CM | POA: Diagnosis present

## 2023-10-14 DIAGNOSIS — Z8249 Family history of ischemic heart disease and other diseases of the circulatory system: Secondary | ICD-10-CM | POA: Diagnosis not present

## 2023-10-14 DIAGNOSIS — Z86711 Personal history of pulmonary embolism: Secondary | ICD-10-CM | POA: Diagnosis not present

## 2023-10-14 DIAGNOSIS — I10 Essential (primary) hypertension: Secondary | ICD-10-CM

## 2023-10-14 DIAGNOSIS — E860 Dehydration: Secondary | ICD-10-CM | POA: Diagnosis present

## 2023-10-14 DIAGNOSIS — Z79899 Other long term (current) drug therapy: Secondary | ICD-10-CM | POA: Diagnosis not present

## 2023-10-14 DIAGNOSIS — Z823 Family history of stroke: Secondary | ICD-10-CM | POA: Diagnosis not present

## 2023-10-14 DIAGNOSIS — K5900 Constipation, unspecified: Secondary | ICD-10-CM | POA: Diagnosis not present

## 2023-10-14 DIAGNOSIS — I69354 Hemiplegia and hemiparesis following cerebral infarction affecting left non-dominant side: Secondary | ICD-10-CM | POA: Diagnosis not present

## 2023-10-14 DIAGNOSIS — K5641 Fecal impaction: Secondary | ICD-10-CM | POA: Diagnosis present

## 2023-10-14 DIAGNOSIS — K219 Gastro-esophageal reflux disease without esophagitis: Secondary | ICD-10-CM | POA: Diagnosis present

## 2023-10-14 DIAGNOSIS — G9341 Metabolic encephalopathy: Secondary | ICD-10-CM | POA: Diagnosis present

## 2023-10-14 DIAGNOSIS — I69391 Dysphagia following cerebral infarction: Secondary | ICD-10-CM | POA: Diagnosis not present

## 2023-10-14 LAB — BLOOD CULTURE ID PANEL (REFLEXED) - BCID2

## 2023-10-14 LAB — COMPREHENSIVE METABOLIC PANEL
ALT: 17 U/L (ref 0–44)
AST: 19 U/L (ref 15–41)
Albumin: 3.6 g/dL (ref 3.5–5.0)
Alkaline Phosphatase: 76 U/L (ref 38–126)
Anion gap: 10 (ref 5–15)
BUN: 10 mg/dL (ref 6–20)
CO2: 22 mmol/L (ref 22–32)
Calcium: 10.8 mg/dL — ABNORMAL HIGH (ref 8.9–10.3)
Chloride: 108 mmol/L (ref 98–111)
Creatinine, Ser: 0.69 mg/dL (ref 0.44–1.00)
GFR, Estimated: >60 mL/min (ref >60)
Glucose, Bld: 80 mg/dL (ref 70–99)
Potassium: 3.6 mmol/L (ref 3.5–5.1)
Sodium: 140 mmol/L (ref 135–145)
Total Bilirubin: 0.5 mg/dL (ref 0.3–1.2)
Total Protein: 6.9 g/dL (ref 6.5–8.1)

## 2023-10-14 LAB — VITAMIN D 25 HYDROXY (VIT D DEFICIENCY, FRACTURES): Vit D, 25-Hydroxy: 40.78 ng/mL (ref 30–100)

## 2023-10-14 LAB — VITAMIN B12: Vitamin B-12: 199 pg/mL (ref 180–914)

## 2023-10-14 MED ORDER — VANCOMYCIN HCL IN DEXTROSE 1-5 GM/200ML-% IV SOLN
1000.0000 mg | Freq: Once | INTRAVENOUS | Status: AC
  Administered 2023-10-14: 1000 mg via INTRAVENOUS
  Filled 2023-10-14: qty 200

## 2023-10-14 MED ORDER — DEXTROSE-SODIUM CHLORIDE 5-0.45 % IV SOLN: INTRAVENOUS | Status: DC

## 2023-10-14 MED ORDER — HYDROMORPHONE HCL 1 MG/ML IJ SOLN
0.5000 mg | Freq: Once | INTRAMUSCULAR | Status: AC
  Administered 2023-10-14: 0.5 mg via INTRAVENOUS
  Filled 2023-10-14: qty 0.5

## 2023-10-14 MED ORDER — HALOPERIDOL LACTATE 5 MG/ML IJ SOLN
2.0000 mg | Freq: Four times a day (QID) | INTRAMUSCULAR | Status: DC | PRN
  Administered 2023-10-14: 2 mg via INTRAVENOUS
  Filled 2023-10-14: qty 1

## 2023-10-14 MED ORDER — VANCOMYCIN HCL 750 MG/150ML IV SOLN
750.0000 mg | INTRAVENOUS | Status: DC
  Administered 2023-10-15: 750 mg via INTRAVENOUS
  Filled 2023-10-14: qty 150

## 2023-10-14 MED ORDER — POLYETHYLENE GLYCOL 3350 17 G PO PACK
17.0000 g | PACK | Freq: Three times a day (TID) | ORAL | Status: DC
  Administered 2023-10-15: 17 g via ORAL
  Filled 2023-10-14 (×2): qty 1

## 2023-10-14 NOTE — Progress Notes (Signed)
[facility]  PROGRESS NOTE  JAQLYN MANSARAY MWU:[id number] DOB: 31-Jan-1971 DOA: 10/13/2023 PCP: Shayne Alken, MD   Brief HPI:    52 y.o. 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   CT abdomen pelvis showing small volume intraluminal air within the bladder which radiologist thinks could reflect recent instrumentation versus infection. Also showing moderate to large rectal stool ball and large predominantly right-sided colonic stool burden.    Assessment/Plan:   Altered mental status -CT head negative -Likely in setting of fecal impaction/constipation -UA not suggestive of infection -TSH 3.5, B12 199, ammonia 25, vitamin D 40.78  Constipation/fecal impaction -CT abdomen/pelvis shows large rectal stool ball, predominantly right-sided colon stool burden -Will order soapsuds enema -Increase MiraLAX to 17 g p.o. 3 times daily  Dysphagia -Patient not swallowing food -Keep n.p.o., check swallow evaluation  History of stroke/ICH -Residual hemiplegia on left -Continue baclofen, gabapentin, mirtazapine, hydroxyzine  History of pulmonary embolism -Continue Eliquis  Hypertension -Continue amlodipine, Coreg, losartan, hydralazine  GERD -Continue Pepcid    Medications     amLODipine  10 mg Oral Daily   apixaban  5 mg Oral BID   baclofen  15 mg Oral TID   buPROPion  75 mg Oral BID   carvedilol  6.25 mg Oral BID WC   famotidine  20 mg Oral BID   gabapentin  100 mg Oral BID   hydrALAZINE  25 mg Oral Q6H   hydrOXYzine  50 mg Oral QHS   losartan  25 mg Oral Daily   mirtazapine  7.5 mg Oral QHS   polyethylene glycol  17 g Oral Daily     Data Reviewed:   CBG:  No results for input(s): "GLUCAP" in the last 168 hours.  SpO2: 100 %    Vitals:    10/14/23 0014 10/14/23 0343 10/14/23 0603 10/14/23 0725  BP: (!) 149/119 (!) 135/101 131/89 (!) 140/92  Pulse: (!) 116 84 82 78  Resp: 18 18 17 16   Temp: (!) 96.3 F (35.7 C) 97.9 F (36.6 C) 97.8 F (36.6 C) 98.2 F (36.8 C)  TempSrc:  Axillary  Oral  SpO2: (!) 89%  100% 100%  Weight: 44 kg     Height:          Data Reviewed:  Basic Metabolic Panel: Recent Labs  Lab 10/13/23 1624 10/14/23 0616  NA 141 140  K 3.9 3.6  CL 107 108  CO2 23 22  GLUCOSE 126* 80  BUN 10 10  CREATININE 0.71 0.69  CALCIUM 11.3* 10.8*    CBC: Recent Labs  Lab 10/13/23 1624  WBC 6.4  NEUTROABS 4.5  HGB 13.4  HCT 40.8  MCV 83.6  PLT 228    LFT Recent Labs  Lab 10/13/23 1624 10/14/23 0616  AST 21 19  ALT 18 17  ALKPHOS 82 76  BILITOT 0.5 0.5  PROT 7.6 6.9  ALBUMIN 3.9 3.6     Antibiotics: Anti-infectives (From admission, onward)    Start     Dose/Rate Route Frequency Ordered Stop   10/13/23 1230  cefTRIAXone (ROCEPHIN) 2 g in sodium chloride 0.9 % 100 mL IVPB        2 g 200 mL/hr over 30 Minutes Intravenous  Once 10/13/23 1228 10/13/23 1837        DVT prophylaxis: Eliquis  Code  Status: Full code  Family Communication: No family at bedside   CONSULTS    Subjective   Has been complaining of abdominal pain   Objective    Physical Examination:   General: Appears in mild distress Cardiovascular: S1-S2, regular Respiratory: Lungs clear to auscultation bilaterally Abdomen: Soft, mild generalized tenderness , no organomegaly Extremities: No edema in the lower extremities Neurologic: Alert, left hemiplegia   Status is: Inpatient:             Meredeth Ide   [facility] If 7PM-7AM, please contact night-coverage at [email or web address], Office  [phone]   10/14/2023, 9:38 AM  LOS: 0 days

## 2023-10-14 NOTE — Progress Notes (Signed)
PHARMACY - PHYSICIAN COMMUNICATION CRITICAL VALUE ALERT - BLOOD CULTURE IDENTIFICATION (BCID)  Sarah Myers is an 52 y.o. female who presented to [facility] on 10/13/2023 with a chief complaint of AMS/agitation. Refusing to taker her medications and nonverbal at SNF.   Assessment: Blood cultures growing 1/4 GPC with BCID of Staph Epi with mecA/C resistance Name of physician (or Provider) Contacted: Dr. Sharl Ma  Current antibiotics: None, ceftriaxone 2g IV x1 given in ED  Changes to prescribed antibiotics recommended:  If provider is concerned for systemic infection, would recommend initiating vancomycin to treat MRSE. However, if patient is improving and there is low suspicion for infection, then culture may be a contaminant. Dr. Sharl Ma requested that pharmacy dose vancomycin to treat for MRSE bacteremia.   Results for orders placed or performed during the hospital encounter of 10/13/23  Blood Culture ID Panel (Reflexed) (Collected: 10/13/2023 12:27 PM)  Result Value Ref Range   Enterococcus faecalis NOT DETECTED NOT DETECTED   Enterococcus Faecium NOT DETECTED NOT DETECTED   Listeria monocytogenes NOT DETECTED NOT DETECTED   Staphylococcus species DETECTED (A) NOT DETECTED   Staphylococcus aureus (BCID) NOT DETECTED NOT DETECTED   Staphylococcus epidermidis DETECTED (A) NOT DETECTED   Staphylococcus lugdunensis NOT DETECTED NOT DETECTED   Streptococcus species NOT DETECTED NOT DETECTED   Streptococcus agalactiae NOT DETECTED NOT DETECTED   Streptococcus pneumoniae NOT DETECTED NOT DETECTED   Streptococcus pyogenes NOT DETECTED NOT DETECTED   A.calcoaceticus-baumannii NOT DETECTED NOT DETECTED   Bacteroides fragilis NOT DETECTED NOT DETECTED   Enterobacterales NOT DETECTED NOT DETECTED   Enterobacter cloacae complex NOT DETECTED NOT DETECTED   Escherichia coli NOT DETECTED NOT DETECTED   Klebsiella aerogenes NOT DETECTED NOT DETECTED   Klebsiella oxytoca NOT DETECTED NOT DETECTED    Klebsiella pneumoniae NOT DETECTED NOT DETECTED   Proteus species NOT DETECTED NOT DETECTED   Salmonella species NOT DETECTED NOT DETECTED   Serratia marcescens NOT DETECTED NOT DETECTED   Haemophilus influenzae NOT DETECTED NOT DETECTED   Neisseria meningitidis NOT DETECTED NOT DETECTED   Pseudomonas aeruginosa NOT DETECTED NOT DETECTED   Stenotrophomonas maltophilia NOT DETECTED NOT DETECTED   Candida albicans NOT DETECTED NOT DETECTED   Candida auris NOT DETECTED NOT DETECTED   Candida glabrata NOT DETECTED NOT DETECTED   Candida krusei NOT DETECTED NOT DETECTED   Candida parapsilosis NOT DETECTED NOT DETECTED   Candida tropicalis NOT DETECTED NOT DETECTED   Cryptococcus neoformans/gattii NOT DETECTED NOT DETECTED   Methicillin resistance mecA/C DETECTED (A) NOT DETECTED     Wilburn Cornelia, PharmD, BCPS Clinical Pharmacist 10/14/2023 10:09 AM   Please refer to AMION for pharmacy phone number

## 2023-10-14 NOTE — Progress Notes (Signed)
Patient RED MEWS upon arrival of the shift due to BP and HR. Staff had a hard time obtaining VS due to patient agitation/squirming in the bed. MD made aware. IV haldol ordered and administered. Will attempt to recheck BP once patient has calmed down. Pt made NPO today due to concerns of pocketing food. This RN attempted to give BP med crushed in ice cream but patient spit it out. Care ongoing.

## 2023-10-14 NOTE — Progress Notes (Signed)
Pharmacy Antibiotic Note  Sarah Myers is a 52 y.o. female admitted on 10/13/2023 with bacteremia.  Pharmacy has been consulted for vancomycin dosing.  Patient received 1 dose of rocephin in the ED. Bcx returned with BCID positive for MRSE in 1/4. Provider has requested to give vancomycin in the setting of AMS and to r/o contamination vs. Infection. WBC wnl, afebrile currently.   Plan: Vancomycin 1000 mg x 1 IV Then, start vancomycin 750 mg IV every 24 hours (eAUC 451, rounded Scr 0.8, TBW)  Trend WBC, fever, renal function and clinical picture  F/u cultures and de-escalate as able   Height: 5\' 3"  (160 cm) Weight: 44 kg (97 lb) IBW/kg (Calculated) : 52.4  Temp (24hrs), Avg:98.5 F (36.9 C), Min:96.3 F (35.7 C), Max:100.2 F (37.9 C)  Recent Labs  Lab 10/13/23 1624 10/13/23 1639 10/14/23 0616  WBC 6.4  --   --   CREATININE 0.71  --  0.69  LATICACIDVEN  --  1.3  --     Estimated Creatinine Clearance: 57.8 mL/min (by C-G formula based on SCr of 0.69 mg/dL).    Allergies  Allergen Reactions   Zestril [Lisinopril] Cough    Antimicrobials this admission: 10/26 Ceftriaxone x 1  10/27 Vancomycin  >>    Microbiology results: 10/26 BCx: GPCs in 1/4, BCID with MRSE    Thank you for allowing pharmacy to be a part of this patient's care.  Griffin Dakin 10/14/2023 10:20 AM

## 2023-10-14 NOTE — Progress Notes (Addendum)
MD made aware of patients last BP. Manually BP 182/120 and took on the machine 186/113. Patient currently rolling in the bed. New order received to give amlodipine. Waiting for MD to order med.

## 2023-10-14 NOTE — Plan of Care (Signed)
  Problem: Clinical Measurements: Goal: Respiratory complications will improve Outcome: Progressing   Problem: Elimination: Goal: Will not experience complications related to bowel motility Outcome: Progressing Goal: Will not experience complications related to urinary retention Outcome: Progressing   Problem: Safety: Goal: Ability to remain free from injury will improve Outcome: Progressing   

## 2023-10-15 DIAGNOSIS — R4182 Altered mental status, unspecified: Secondary | ICD-10-CM | POA: Diagnosis not present

## 2023-10-15 DIAGNOSIS — G9340 Encephalopathy, unspecified: Secondary | ICD-10-CM | POA: Diagnosis not present

## 2023-10-15 DIAGNOSIS — K5900 Constipation, unspecified: Secondary | ICD-10-CM | POA: Diagnosis not present

## 2023-10-15 DIAGNOSIS — I10 Essential (primary) hypertension: Secondary | ICD-10-CM | POA: Diagnosis not present

## 2023-10-15 LAB — GLUCOSE, CAPILLARY
Glucose-Capillary: 187 mg/dL — ABNORMAL HIGH (ref 70–99)
Glucose-Capillary: 106 mg/dL — ABNORMAL HIGH (ref 70–99)
Glucose-Capillary: 82 mg/dL (ref 70–99)

## 2023-10-15 LAB — CBC
HCT: 34.9 % — ABNORMAL LOW (ref 36.0–46.0)
Hemoglobin: 12.4 g/dL (ref 12.0–15.0)
MCH: 30.9 pg (ref 26.0–34.0)
MCHC: 35.5 g/dL (ref 30.0–36.0)
MCV: 87 fL (ref 80.0–100.0)
Platelets: 150 10*3/uL (ref 150–400)
RBC: 4.01 MIL/uL (ref 3.87–5.11)
RDW: 14.4 % (ref 11.5–15.5)
WBC: 4.1 10*3/uL (ref 4.0–10.5)
nRBC: 0 % (ref 0.0–0.2)

## 2023-10-15 LAB — BASIC METABOLIC PANEL
Anion gap: 8 (ref 5–15)
BUN: 6 mg/dL (ref 6–20)
CO2: 20 mmol/L — ABNORMAL LOW (ref 22–32)
Calcium: 9.9 mg/dL (ref 8.9–10.3)
Chloride: 108 mmol/L (ref 98–111)
Creatinine, Ser: 0.65 mg/dL (ref 0.44–1.00)
GFR, Estimated: >60 mL/min (ref >60)
Glucose, Bld: 123 mg/dL — ABNORMAL HIGH (ref 70–99)
Potassium: 3.7 mmol/L (ref 3.5–5.1)
Sodium: 136 mmol/L (ref 135–145)

## 2023-10-15 MED ORDER — LORAZEPAM 2 MG/ML IJ SOLN
1.0000 mg | Freq: Once | INTRAMUSCULAR | Status: AC
  Administered 2023-10-15: 1 mg via INTRAVENOUS
  Filled 2023-10-15: qty 1

## 2023-10-15 MED ORDER — POLYETHYLENE GLYCOL 3350 17 G PO PACK
17.0000 g | PACK | Freq: Every day | ORAL | Status: DC
  Administered 2023-10-16: 17 g via ORAL
  Filled 2023-10-15: qty 1

## 2023-10-15 MED ORDER — LABETALOL HCL 5 MG/ML IV SOLN
5.0000 mg | INTRAVENOUS | Status: DC | PRN
  Administered 2023-10-15: 5 mg via INTRAVENOUS
  Filled 2023-10-15: qty 4

## 2023-10-15 NOTE — Progress Notes (Signed)
[facility]  PROGRESS NOTE  Sarah Myers ZOX:[id number] DOB: Apr 28, 1971 DOA: 10/13/2023 PCP: Shayne Alken, MD   Brief HPI:    52 y.o. 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   CT abdomen pelvis showing small volume intraluminal air within the bladder which radiologist thinks could reflect recent instrumentation versus infection. Also showing moderate to large rectal stool ball and large predominantly right-sided colonic stool burden.    Assessment/Plan:   Altered mental status -Resolved, back to baseline -CT head negative -Likely in setting of fecal impaction/constipation -UA not suggestive of infection -TSH 3.5, B12 199, ammonia 25, vitamin D 40.78  Constipation/fecal impaction -Resolved after manual disimpaction and fleets enema -CT abdomen/pelvis shows large rectal stool ball, predominantly right-sided colon stool burden -Will order soapsuds enema -Was started on MiraLAX 17 g p.o. 3 times daily for 6 doses -Start MiraLAX 17 g p.o. daily  Dysphagia -Swallow evaluation obtained -Started on regular diet  History of stroke/ICH -Residual hemiplegia on left -Continue baclofen, gabapentin, mirtazapine, hydroxyzine  History of pulmonary embolism -Continue Eliquis  Hypertension -Continue amlodipine, Coreg, losartan, hydralazine  GERD -Continue Pepcid    Medications     amLODipine  10 mg Oral Daily   apixaban  5 mg Oral BID   baclofen  15 mg Oral TID   buPROPion  75 mg Oral BID   carvedilol  6.25 mg Oral BID WC   famotidine  20 mg Oral BID   gabapentin  100 mg Oral BID   hydrALAZINE  25 mg Oral Q6H   hydrOXYzine  50 mg Oral QHS   losartan  25 mg Oral Daily   mirtazapine  7.5 mg Oral QHS   polyethylene glycol  17 g Oral TID      Data Reviewed:   CBG:  Recent Labs  Lab 10/15/23 0210 10/15/23 0620  GLUCAP 187* 106*    SpO2: 100 % O2 Flow Rate (L/min): 0 L/min    Vitals:   10/14/23 2029 10/15/23 0002 10/15/23 0501 10/15/23 0805  BP: (!) 190/113 (!) 178/114 (!) 158/93 (!) 130/91  Pulse: (!) 123 (!) 102 62 76  Resp:  18 18 16   Temp:  98.9 F (37.2 C) 98.5 F (36.9 C) (!) 97.3 F (36.3 C)  TempSrc:  Oral Oral Oral  SpO2:  92% 97% 100%  Weight:      Height:          Data Reviewed:  Basic Metabolic Panel: Recent Labs  Lab 10/13/23 1624 10/14/23 0616  NA 141 140  K 3.9 3.6  CL 107 108  CO2 23 22  GLUCOSE 126* 80  BUN 10 10  CREATININE 0.71 0.69  CALCIUM 11.3* 10.8*    CBC: Recent Labs  Lab 10/13/23 1624  WBC 6.4  NEUTROABS 4.5  HGB 13.4  HCT 40.8  MCV 83.6  PLT 228    LFT Recent Labs  Lab 10/13/23 1624 10/14/23 0616  AST 21 19  ALT 18 17  ALKPHOS 82 76  BILITOT 0.5 0.5  PROT 7.6 6.9  ALBUMIN 3.9 3.6     Antibiotics: Anti-infectives (From admission, onward)    Start     Dose/Rate Route Frequency Ordered Stop   10/15/23 1200  vancomycin (VANCOREADY) IVPB 750 mg/150 mL        750 mg 150 mL/hr over 60  Minutes Intravenous Every 24 hours 10/14/23 1028     10/14/23 1115  vancomycin (VANCOCIN) IVPB 1000 mg/200 mL premix        1,000 mg 200 mL/hr over 60 Minutes Intravenous  Once 10/14/23 1028 10/14/23 1240   10/13/23 1230  cefTRIAXone (ROCEPHIN) 2 g in sodium chloride 0.9 % 100 mL IVPB        2 g 200 mL/hr over 30 Minutes Intravenous  Once 10/13/23 1228 10/13/23 1837        DVT prophylaxis: Eliquis  Code Status: Full code  Family Communication: Discussed with patient's daughter at bedside on 10/14/2023   CONSULTS    Subjective    Looks better this morning.  Denies abdominal pain  Objective    Physical Examination:   Appears in no acute distress S1-S2, regular Lungs clear to auscultation bilaterally Abdomen is soft, nontender, no  organomegaly   Status is: Inpatient:             Meredeth Ide   [facility] If 7PM-7AM, please contact night-coverage at [email or web address], Office  [phone]   10/15/2023, 8:47 AM  LOS: 1 day

## 2023-10-15 NOTE — Progress Notes (Signed)
Around 00:45 NT went to check on patient and reposition her in the bed. Shortly after, patient's bed alarm went off. Staff responded to the room and found the patient on the matt on the floor, along with her pillow. Patient was helped back into bed, cleaned up, and repositioned. Patient did not hit head. Patient denied being in any pain. MD made aware and patient was given IV ativan due to ongoing agitation and confusion during the shift. Patient's daughter was also notified. Patient has been asleep since incident. Care ongoing.   10/15/23 0200  What Happened  Was fall witnessed? No  Was patient injured? No  Patient found on floor  Found by Staff-comment Lajoyce Corners, NT)  Stated prior activity to/from bed, chair, or stretcher  Provider Notification  Provider Name/Title DO Julian Reil  Date Provider Notified 10/15/23  Time Provider Notified 0115  Method of Notification Page (Secure chat)  Notification Reason Fall  Provider response See new orders  Date of Provider Response 10/15/23  Time of Provider Response 0116  Follow Up  Family notified Yes - comment (Daughter [name])  Time family notified 0130  Additional tests No  Adult Fall Risk Assessment  Risk Factor Category (scoring not indicated) High fall risk per protocol (document High fall risk)  Adult Fall Risk Interventions  Required Bundle Interventions *See Row Information* High fall risk - low, moderate, and high requirements implemented  Additional Interventions Reorient/diversional activities with confused patients;Use of appropriate toileting equipment (bedpan, BSC, etc.)  Fall intervention(s) refused/Patient educated regarding refusal Bed alarm;Nonskid socks  Screening for Fall Injury Risk (To be completed on HIGH fall risk patients) - Assessing Need for Floor Mats  Risk For Fall Injury- Criteria for Floor Mats Confusion/dementia (+NuDESC, CIWA, TBI, etc.)  Will Implement Floor Mats Yes  Pain Assessment  Pain Scale PAINAD  PAINAD (Pain  Assessment in Advanced Dementia)  Breathing 0  Negative Vocalization 0  Facial Expression 0  Body Language 1  Consolability 0  PAINAD Score 1

## 2023-10-15 NOTE — Evaluation (Signed)
Clinical/Bedside Swallow Evaluation Patient Details  Name: Sarah Myers MRN: [id number] Date of Birth: 1970-12-24  Today's Date: 10/15/2023 Time: SLP Start Time (ACUTE ONLY): 4098 SLP Stop Time (ACUTE ONLY): 0936 SLP Time Calculation (min) (ACUTE ONLY): 11 min  Past Medical History:  Past Medical History:  Diagnosis Date   Hypertension    Past Surgical History:  Past Surgical History:  Procedure Laterality Date   BREAST LUMPECTOMY  1992   CESAREAN SECTION     IR GASTROSTOMY TUBE MOD SED  03/06/2022   ORIF RADIAL FRACTURE  02/10/2012   Procedure: OPEN REDUCTION INTERNAL FIXATION (ORIF) RADIAL FRACTURE;  Surgeon: Cammy Copa, MD;  Location: [facility] OR;  Service: Orthopedics;  Laterality: Right;   ORIF ULNAR FRACTURE  02/10/2012   Procedure: OPEN REDUCTION INTERNAL FIXATION (ORIF) ULNAR FRACTURE;  Surgeon: Cammy Copa, MD;  Location: [facility] OR;  Service: Orthopedics;  Laterality: Right;   TUBAL LIGATION     HPI:  52 y.o. female presented to ED via EMS from [facility] for evaluation of altered mental status.  Dx AMS, fecal impaction/constipation. Prior medical history significant for stroke/ICH with residual left hemiplegia (Feb 2023), prolonged hospitalization with PEG/trach and cognitive impairment, history of bilateral PE on Eliquis, hypertension, anxiety. Has been seen by SLP during prior admissions, dysphagia appears to ebb and flow.    Assessment / Plan / Recommendation  Clinical Impression  Pt was sleepy this am - she could be roused for participation in swallowing assessment, but kept her eyes closed. Demonstrated adequate oral recognition and manipulation of POs, no s/s of aspiration.  She consumed her pills whole with water without difficulty as long as she was alert.  Nurse present for assessment.  Recommend resuming regular solid food diet - hold tray when she is not alert, provide assistance with tray set-up and feeding when groggy.  May have meds whole with water.   No SLP f/u is needed. Our service will sign off. D/W nurse. SLP Visit Diagnosis: Dysphagia, unspecified (R13.10)    Aspiration Risk  No limitations    Diet Recommendation   Thin;Age appropriate regular  Medication Administration: Whole meds with liquid    Other  Recommendations Oral Care Recommendations: Oral care BID    Recommendations for follow up therapy are one component of a multi-disciplinary discharge planning process, led by the attending physician.  Recommendations may be updated based on patient status, additional functional criteria and insurance authorization.  Follow up Recommendations No SLP follow up                               Swallow Study   General HPI: 52 y.o. female presented to ED via EMS from [facility] for evaluation of altered mental status.  Dx AMS, fecal impaction/constipation. Prior medical history significant for stroke/ICH with residual left hemiplegia (Feb 2023), prolonged hospitalization with PEG/trach and cognitive impairment, history of bilateral PE on Eliquis, hypertension, anxiety. Has been seen by SLP during prior admissions, dysphagia appears to ebb and flow. Type of Study: Bedside Swallow Evaluation Previous Swallow Assessment: see HPI -last was July 2023 Diet Prior to this Study: NPO Temperature Spikes Noted: No Respiratory Status: Room air History of Recent Intubation: No Behavior/Cognition: Lethargic/Drowsy Oral Cavity Assessment: Within Functional Limits Oral Care Completed by SLP: No Vision:  (kept eyes closed) Self-Feeding Abilities: Needs assist Patient Positioning: Upright in bed Baseline Vocal Quality: Normal Volitional Cough: Strong Volitional Swallow: Able to elicit  Ice Chips Ice chips: Within functional limits   Thin Liquid Thin Liquid: Within functional limits    Nectar Thick Nectar Thick Liquid: Not tested   Honey Thick Honey Thick Liquid: Not tested   Puree Puree: Within functional limits    Solid     Solid: Within functional limits     Jkwon Treptow L. Samson Frederic, MA CCC/SLP Clinical Specialist - Acute Care SLP Acute Rehabilitation Services Office number [phone]  Blenda Mounts Laurice 10/15/2023,9:47 AM

## 2023-10-15 NOTE — Discharge Instructions (Signed)
Information on my medicine - ELIQUIS (apixaban)  This medication education was reviewed with me or my healthcare representative as part of my discharge preparation.    Why was Eliquis prescribed for you? Eliquis was prescribed to treat blood clots that may have been found in the veins of your legs (deep vein thrombosis) or in your lungs (pulmonary embolism) and to reduce the risk of them occurring again.  What do You need to know about Eliquis ? Continue Eliquis 5 mg tablet taken TWICE daily.  Eliquis may be taken with or without food.   Try to take the dose about the same time in the morning and in the evening. If you have difficulty swallowing the tablet whole please discuss with your pharmacist how to take the medication safely.  Take Eliquis exactly as prescribed and DO NOT stop taking Eliquis without talking to the doctor who prescribed the medication.  Stopping may increase your risk of developing a new blood clot.  Refill your prescription before you run out.  After discharge, you should have regular check-up appointments with your healthcare provider that is prescribing your Eliquis.    What do you do if you miss a dose? If a dose of ELIQUIS is not taken at the scheduled time, take it as soon as possible on the same day and twice-daily administration should be resumed. The dose should not be doubled to make up for a missed dose.  Important Safety Information A possible side effect of Eliquis is bleeding. You should call your healthcare provider right away if you experience any of the following: Bleeding from an injury or your nose that does not stop. Unusual colored urine (red or dark brown) or unusual colored stools (red or black). Unusual bruising for unknown reasons. A serious fall or if you hit your head (even if there is no bleeding).  Some medicines may interact with Eliquis and might increase your risk of bleeding or clotting while on Eliquis. To help avoid this,  consult your healthcare provider or pharmacist prior to using any new prescription or non-prescription medications, including herbals, vitamins, non-steroidal anti-inflammatory drugs (NSAIDs) and supplements.  This website has more information on Eliquis (apixaban): http://[email or web address].com/eliquis/home     Follow with Primary MD Kirstie Peri, MD in 7 days   Get CBC, CMP, anemia panel, TSH, 2 view Chest X ray -  checked next visit with your primary MD    Activity: As tolerated with Full fall precautions use walker/cane & assistance as needed  Disposition Home    Diet: Heart Healthy    Special Instructions: If you have smoked or chewed Tobacco  in the last 2 yrs please stop smoking, stop any regular Alcohol  and or any Recreational drug use.  On your next visit with your primary care physician please Get Medicines reviewed and adjusted.  Please request your Prim.MD to go over all Hospital Tests and Procedure/Radiological results at the follow up, please get all Hospital records sent to your Prim MD by signing hospital release before you go home.  If you experience worsening of your admission symptoms, develop shortness of breath, life threatening emergency, suicidal or homicidal thoughts you must seek medical attention immediately by calling 911 or calling your MD immediately  if symptoms less severe.  You Must read complete instructions/literature along with all the possible adverse reactions/side effects for all the Medicines you take and that have been prescribed to you. Take any new Medicines after you have completely understood and accpet all the

## 2023-10-15 NOTE — Progress Notes (Signed)
Ok to repeat blood cultures per Dr Sharl Ma.   Ulyses Southward, PharmD, BCIDP, AAHIVP, CPP Infectious Disease Pharmacist 10/15/2023 9:46 AM

## 2023-10-15 NOTE — Plan of Care (Signed)
  Problem: Clinical Measurements: Goal: Ability to maintain clinical measurements within normal limits will improve Outcome: Progressing Goal: Will remain free from infection Outcome: Progressing Goal: Diagnostic test results will improve Outcome: Progressing Goal: Respiratory complications will improve Outcome: Progressing   Problem: Nutrition: Goal: Adequate nutrition will be maintained Outcome: Progressing   Problem: Coping: Goal: Level of anxiety will decrease Outcome: Progressing   Problem: Pain Management: Goal: General experience of comfort will improve Outcome: Progressing   Problem: Safety: Goal: Ability to remain free from injury will improve Outcome: Progressing

## 2023-10-15 NOTE — TOC Progression Note (Signed)
Transition of Care [facility]) - Progression Note    Patient Details  Name: KESLYNN COLOMBINI MRN: [id number] Date of Birth: 1971/05/14  [facility] CM/SW Contact  Sakira Dahmer A [place], [place] Phone Number: 10/15/2023, 3:33 PM  Clinical Narrative:     CSW contacted pt's POC, [name], to assist with pt's assessment as pt is not oriented. CSW left contact information to be reached back out to.   TOC will continue to follow.        Expected Discharge Plan and Services                                               Social Determinants of Health (SDOH) Interventions SDOH Screenings   Depression (PHQ2-9): Low Risk  (08/23/2023)  Tobacco Use: Low Risk  (09/12/2023)    Readmission Risk Interventions    07/05/2022   11:29 AM 06/30/2022   12:52 PM 03/09/2022    9:54 AM  Readmission Risk Prevention Plan  Transportation Screening Complete Complete Complete  PCP or Specialist Appt within 5-7 Days  Complete   PCP or Specialist Appt within 3-5 Days Complete  Complete  Home Care Screening  Complete   Medication Review (RN CM)  Complete   HRI or Home Care Consult Complete  Not Complete  Social Work Consult for Recovery Care Planning/Counseling Complete  Complete  Palliative Care Screening Complete    Medication Review Oceanographer) Complete  Complete

## 2023-10-16 DIAGNOSIS — K5900 Constipation, unspecified: Secondary | ICD-10-CM | POA: Diagnosis not present

## 2023-10-16 DIAGNOSIS — G9340 Encephalopathy, unspecified: Secondary | ICD-10-CM | POA: Diagnosis not present

## 2023-10-16 LAB — CULTURE, BLOOD (ROUTINE X 2)

## 2023-10-16 LAB — GLUCOSE, CAPILLARY
Glucose-Capillary: 114 mg/dL — ABNORMAL HIGH (ref 70–99)
Glucose-Capillary: 86 mg/dL (ref 70–99)
Glucose-Capillary: 88 mg/dL (ref 70–99)

## 2023-10-16 MED ORDER — POLYETHYLENE GLYCOL 3350 17 G PO PACK: 17.0000 g | PACK | Freq: Every day | ORAL | Status: AC

## 2023-10-16 NOTE — Discharge Summary (Signed)
Physician Discharge Summary   Patient: Sarah Myers MRN: [id number] DOB: Mar 18, 1971  Admit date:     10/13/2023  Discharge date: 10/16/23  Discharge Physician: Meredeth Ide   PCP: Shayne Alken, MD   Recommendations at discharge:   Patient to be discharged to skilled nursing facility  Discharge Diagnoses: Principal Problem:   AMS (altered mental status) Active Problems:   Hypercalcemia   Essential hypertension   History of stroke   History of pulmonary embolism   Constipation  Resolved Problems:   * No resolved hospital problems. *  Hospital Course:  52 y.o. 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   CT abdomen pelvis showing small volume intraluminal air within the bladder which radiologist thinks could reflect recent instrumentation versus infection. Also showing moderate to large rectal stool ball and large predominantly right-sided colonic stool burden.   Assessment and Plan:  Altered mental status -Resolved, back to baseline -CT head negative -Likely in setting of fecal impaction/constipation -UA not suggestive of infection -TSH 3.5, B12 199, ammonia 25, vitamin D 40.78   Constipation/fecal impaction -Resolved after manual disimpaction and fleets enema -CT abdomen/pelvis shows large rectal stool ball, predominantly right-sided colon stool burden -Will order soapsuds enema -Was started on MiraLAX 17 g p.o. 3 times daily for 6 doses -Start MiraLAX 17 g p.o. daily   Dysphagia -Swallow evaluation obtained -Started on regular diet   History of stroke/ICH -Residual hemiplegia on left -Continue baclofen, gabapentin, mirtazapine, hydroxyzine   History of pulmonary embolism -Continue Eliquis   Hypertension -Continue amlodipine, Coreg,  losartan, hydralazine   GERD -Continue Pepcid         Consultants:  Procedures performed:  Disposition: Skilled nursing facility Diet recommendation:  Discharge Diet Orders (From admission, onward)     Start     Ordered   10/16/23 0000  Diet - low sodium heart healthy        10/16/23 1254           Regular diet DISCHARGE MEDICATION: Allergies as of 10/16/2023       Reactions   Zestril [lisinopril] Cough        Medication List     TAKE these medications    acetaminophen 500 MG tablet Commonly known as: TYLENOL Take 1,000 mg by mouth every 6 (six) hours as needed (pain).   Amantadine HCl 100 MG tablet Take 100 mg by mouth 2 (two) times daily.   amLODipine 10 MG tablet Commonly known as: NORVASC Take 1 tablet (10 mg total) by mouth daily.   apixaban 5 MG Tabs tablet Commonly known as: ELIQUIS Take 1 tablet (5 mg total) by mouth 2 (two) times daily.   Baclofen 15 MG Tabs Take 15 mg by mouth 3 (three) times daily.   buPROPion 75 MG tablet Commonly known as: WELLBUTRIN Take 75 mg by mouth 2 (two) times daily.   carvedilol 6.25 MG tablet Commonly known as: COREG Take 1 tablet (6.25 mg total) by mouth 2 (two) times daily with a meal.   cholecalciferol 25 MCG (1000 UNIT) tablet Commonly known as: VITAMIN D3 Take 1,000 Units by mouth daily.   famotidine 20 MG tablet Commonly known as: PEPCID Take 1 tablet (20 mg total) by mouth 2 (two) times daily.   gabapentin 100 MG capsule Commonly known as: NEURONTIN Take 1  capsule (100 mg total) by mouth 2 (two) times daily.   hydrALAZINE 25 MG tablet Commonly known as: APRESOLINE Take 25 mg by mouth every 6 (six) hours.   hydrOXYzine 50 MG capsule Commonly known as: VISTARIL Take 50 mg by mouth at bedtime.   losartan 25 MG tablet Commonly known as: COZAAR Take 1 tablet (25 mg total) by mouth daily.   magnesium gluconate 500 MG tablet Commonly known as: MAGONATE Take 1 tablet (500 mg total) by  mouth at bedtime.   mirtazapine 7.5 MG tablet Commonly known as: REMERON Take 7.5 mg by mouth at bedtime.   multivitamin with minerals Tabs tablet Take 1 tablet by mouth daily.   NUTRITIONAL SUPPLEMENT PO Take 1 each by mouth 2 (two) times daily. Frozen nutritional treat   NUTRITIONAL DRINK PO Take 1 Container by mouth See admin instructions. 2 entries on MAR: 1) House shake, twice daily at 1000 and 1400 2) House supplement, three times daily at 1000, 1600, and 2200   ondansetron 4 MG tablet Commonly known as: ZOFRAN Take 1 tablet (4 mg total) by mouth every 8 (eight) hours as needed for nausea or vomiting.   polyethylene glycol 17 g packet Commonly known as: MIRALAX / GLYCOLAX Take 17 g by mouth daily. Start taking on: October 17, 2023   saccharomyces boulardii 250 MG capsule Commonly known as: FLORASTOR Take 250 mg by mouth daily.        Contact information for after-discharge care     Destination     [facility] .   Service: Skilled Nursing Contact information: [address] [place] [address] [phone]                    Discharge Exam: Ceasar Mons Weights   10/14/23 0014  Weight: 44 kg   General-appears in no acute distress Heart-S1-S2, regular, no murmur auscultated Lungs-clear to auscultation bilaterally, no wheezing or crackles auscultated Abdomen-soft, nontender, no organomegaly Extremities-no edema in the lower extremities Neuro-alert, oriented x3, no focal deficit noted  Condition at discharge: good  The results of significant diagnostics from this hospitalization (including imaging, microbiology, ancillary and laboratory) are listed below for reference.   Imaging Studies: MR BRAIN WO CONTRAST  Result Date: 10/13/2023 CLINICAL DATA:  Delirium EXAM: MRI HEAD WITHOUT CONTRAST TECHNIQUE: Multiplanar, multiecho pulse sequences of the brain and surrounding structures were obtained without intravenous contrast.  COMPARISON:  None Available. FINDINGS: Examination was terminated prior to completion. 4 sequences were obtained. Furthermore, there is moderate motion degradation. Brain: No acute infarct, mass effect or extra-axial collection. Mild generalized volume loss. Old left PCA territory infarct. Vascular: Poor visualization of flow voids due to motion Skull and upper cervical spine: Unremarkable Sinuses/Orbits:Paranasal sinuses are clear. IMPRESSION: 1. Examination was terminated prior to completion. 4 sequences were obtained, with moderate motion degradation. 2. No acute intracranial abnormality. 3. Old left PCA territory infarct. Electronically Signed   By: Deatra Robinson M.D.   On: 10/13/2023 23:38   CT ABDOMEN PELVIS W CONTRAST  Result Date: 10/13/2023 CLINICAL DATA:  Abdominal pain, acute, nonlocalized EXAM: CT ABDOMEN AND PELVIS WITH CONTRAST TECHNIQUE: Multidetector CT imaging of the abdomen and pelvis was performed using the standard protocol following bolus administration of intravenous contrast. RADIATION DOSE REDUCTION: This exam was performed according to the departmental dose-optimization program which includes automated exposure control, adjustment of the mA and/or kV according to patient size and/or use of iterative reconstruction technique. CONTRAST:  75mL OMNIPAQUE IOHEXOL 350 MG/ML  SOLN COMPARISON:  February 05, 2023 FINDINGS: Evaluation is limited by patient positioning and streak artifact. Lower chest: No acute abnormality. Hepatobiliary: No focal liver abnormality is seen. No gallstones, gallbladder wall thickening, or biliary dilatation. Pancreas: Unremarkable. No pancreatic ductal dilatation or surrounding inflammatory changes. Spleen: Normal in size without focal abnormality. Adrenals/Urinary Tract: Adrenal glands are unremarkable. Kidneys enhance symmetrically. No hydronephrosis. No obstructing nephrolithiasis. Small volume intraluminal air within the bladder. Subjective bladder wall  prominence for degree of distension. Stomach/Bowel: No evidence of bowel obstruction. Moderate to large rectal stool ball. Large predominantly RIGHT-sided colonic stool burden. Appendix is normal. Stomach is unremarkable. Vascular/Lymphatic: Aortic atherosclerosis. No enlarged abdominal or pelvic lymph nodes. Reproductive: Uterus and bilateral adnexa are unremarkable. Other: No free air or free fluid. Musculoskeletal: No acute or significant osseous findings. IMPRESSION: 1. Small volume intraluminal air within the bladder. This could reflect recent instrumentation versus infection. Recommend correlation with urinalysis. 2. Moderate to large rectal stool ball. Large predominantly RIGHT-sided colonic stool burden. Aortic Atherosclerosis (ICD10-I70.0). Electronically Signed   By: Meda Klinefelter M.D.   On: 10/13/2023 18:43   DG Chest Port 1 View  Result Date: 10/13/2023 CLINICAL DATA:  Questionable sepsis - evaluate for abnormality EXAM: PORTABLE CHEST 1 VIEW COMPARISON:  Apr 25, 2023 FINDINGS: The cardiomediastinal silhouette is unchanged in contour. No pleural effusion. No pneumothorax. No acute pleuroparenchymal abnormality. IMPRESSION: No acute cardiopulmonary abnormality. Electronically Signed   By: Meda Klinefelter M.D.   On: 10/13/2023 18:36   CT Head Wo Contrast  Result Date: 10/13/2023 CLINICAL DATA:  Mental status change EXAM: CT HEAD WITHOUT CONTRAST TECHNIQUE: Contiguous axial images were obtained from the base of the skull through the vertex without intravenous contrast. RADIATION DOSE REDUCTION: This exam was performed according to the departmental dose-optimization program which includes automated exposure control, adjustment of the mA and/or kV according to patient size and/or use of iterative reconstruction technique. COMPARISON:  None Available. FINDINGS: Brain: No acute intracranial hemorrhage. No focal mass lesion. No CT evidence of acute infarction. No midline shift or mass effect. No  hydrocephalus. Basilar cisterns are patent. Volume loss in the posteromedial LEFT parietal lobe and dilatation adjacent ventricle consistent with remote infarction. No change from prior. There are periventricular and subcortical white matter hypodensities. Generalized cortical atrophy. Vascular: No hyperdense vessel or unexpected calcification. Skull: Normal. Negative for fracture or focal lesion. Sinuses/Orbits: Paranasal sinuses and mastoid air cells are clear. Orbits are clear. Other: None. IMPRESSION: 1. No acute intracranial findings. 2. Remote LEFT parietal infarction. 3. Chronic microvascular ischemia. Electronically Signed   By: Genevive Bi M.D.   On: 10/13/2023 13:58    Microbiology: Results for orders placed or performed during the hospital encounter of 10/13/23  Blood Culture (routine x 2)     Status: Abnormal (Preliminary result)   Collection Time: 10/13/23 12:27 PM   Specimen: BLOOD RIGHT HAND  Result Value Ref Range Status   Specimen Description BLOOD RIGHT HAND  Final   Special Requests   Final    BOTTLES DRAWN AEROBIC AND ANAEROBIC Blood Culture results may not be optimal due to an excessive volume of blood received in culture bottles   Culture  Setup Time   Final    GRAM POSITIVE COCCI IN CLUSTERS ANAEROBIC BOTTLE ONLY CRITICAL RESULT CALLED TO, READ BACK BY AND VERIFIED WITH: PHARMD CARLA JARDIN [id number] AT 1006 BY EC    Culture (A)  Final    STAPHYLOCOCCUS HOMINIS STAPHYLOCOCCUS CAPITIS CULTURE REINCUBATED FOR BETTER GROWTH Performed at [facility], [address] [place], [place] [address]    Report Status PENDING  Incomplete  Blood Culture ID Panel (Reflexed)     Status: Abnormal   Collection Time: 10/13/23 12:27 PM  Result Value Ref Range Status   Enterococcus faecalis NOT DETECTED NOT DETECTED Final   Enterococcus Faecium NOT DETECTED NOT DETECTED Final   Listeria monocytogenes NOT DETECTED NOT DETECTED Final   Staphylococcus species DETECTED (A) NOT  DETECTED Final    Comment: CRITICAL RESULT CALLED TO, READ BACK BY AND VERIFIED WITH: PHARMD CARLA JARDIN [id number] AT 1006 BY EC    Staphylococcus aureus (BCID) NOT DETECTED NOT DETECTED Final   Staphylococcus epidermidis DETECTED (A) NOT DETECTED Final    Comment: Methicillin (oxacillin) resistant coagulase negative staphylococcus. Possible blood culture contaminant (unless isolated from more than one blood culture draw or clinical case suggests pathogenicity). No antibiotic treatment is indicated for blood  culture contaminants. CRITICAL RESULT CALLED TO, READ BACK BY AND VERIFIED WITH: PHARMD CARLA JARDIN [id number] AT 1006 BY EC    Staphylococcus lugdunensis NOT DETECTED NOT DETECTED Final   Streptococcus species NOT DETECTED NOT DETECTED Final   Streptococcus agalactiae NOT DETECTED NOT DETECTED Final   Streptococcus pneumoniae NOT DETECTED NOT DETECTED Final   Streptococcus pyogenes NOT DETECTED NOT DETECTED Final   A.calcoaceticus-baumannii NOT DETECTED NOT DETECTED Final   Bacteroides fragilis NOT DETECTED NOT DETECTED Final   Enterobacterales NOT DETECTED NOT DETECTED Final   Enterobacter cloacae complex NOT DETECTED NOT DETECTED Final   Escherichia coli NOT DETECTED NOT DETECTED Final   Klebsiella aerogenes NOT DETECTED NOT DETECTED Final   Klebsiella oxytoca NOT DETECTED NOT DETECTED Final   Klebsiella pneumoniae NOT DETECTED NOT DETECTED Final   Proteus species NOT DETECTED NOT DETECTED Final   Salmonella species NOT DETECTED NOT DETECTED Final   Serratia marcescens NOT DETECTED NOT DETECTED Final   Haemophilus influenzae NOT DETECTED NOT DETECTED Final   Neisseria meningitidis NOT DETECTED NOT DETECTED Final   Pseudomonas aeruginosa NOT DETECTED NOT DETECTED Final   Stenotrophomonas maltophilia NOT DETECTED NOT DETECTED Final   Candida albicans NOT DETECTED NOT DETECTED Final   Candida auris NOT DETECTED NOT DETECTED Final   Candida glabrata NOT DETECTED NOT DETECTED Final    Candida krusei NOT DETECTED NOT DETECTED Final   Candida parapsilosis NOT DETECTED NOT DETECTED Final   Candida tropicalis NOT DETECTED NOT DETECTED Final   Cryptococcus neoformans/gattii NOT DETECTED NOT DETECTED Final   Methicillin resistance mecA/C DETECTED (A) NOT DETECTED Final    Comment: CRITICAL RESULT CALLED TO, READ BACK BY AND VERIFIED WITH: Alroy Bailiff JARDIN [id number] AT 1006 BY EC Performed at [facility], [address], [place], [place] [address]   Resp panel by RT-PCR (RSV, Flu A&B, Covid) Anterior Nasal Swab     Status: None   Collection Time: 10/13/23 12:28 PM   Specimen: Anterior Nasal Swab  Result Value Ref Range Status   SARS Coronavirus 2 by RT PCR NEGATIVE NEGATIVE Final   Influenza A by PCR NEGATIVE NEGATIVE Final   Influenza B by PCR NEGATIVE NEGATIVE Final    Comment: (NOTE) The Xpert Xpress SARS-CoV-2/FLU/RSV plus assay is intended as an aid in the diagnosis of influenza from Nasopharyngeal swab specimens and should not be used as a sole basis for treatment. Nasal washings and aspirates are unacceptable for Xpert Xpress SARS-CoV-2/FLU/RSV testing.  Fact Sheet for Patients: BloggerCourse.com  Fact Sheet for Healthcare Providers: SeriousBroker.it  This test is not yet  approved or cleared by the [place] and has been authorized for detection and/or diagnosis of SARS-CoV-2 by FDA under an Emergency Use Authorization (EUA). This EUA will remain in effect (meaning this test can be used) for the duration of the COVID-19 declaration under Section 564(b)(1) of the Act, 21 U.S.C. section 360bbb-3(b)(1), unless the authorization is terminated or revoked.     Resp Syncytial Virus by PCR NEGATIVE NEGATIVE Final    Comment: (NOTE) Fact Sheet for Patients: BloggerCourse.com  Fact Sheet for Healthcare Providers: SeriousBroker.it  This test is  not yet approved or cleared by the [place] FDA and has been authorized for detection and/or diagnosis of SARS-CoV-2 by FDA under an Emergency Use Authorization (EUA). This EUA will remain in effect (meaning this test can be used) for the duration of the COVID-19 declaration under Section 564(b)(1) of the Act, 21 U.S.C. section 360bbb-3(b)(1), unless the authorization is terminated or revoked.  Performed at [facility], [address], [place], [place] [address]   Blood Culture (routine x 2)     Status: None (Preliminary result)   Collection Time: 10/14/23  6:16 AM   Specimen: BLOOD RIGHT HAND  Result Value Ref Range Status   Specimen Description BLOOD RIGHT HAND  Final   Special Requests   Final    BOTTLES DRAWN AEROBIC AND ANAEROBIC Blood Culture adequate volume   Culture   Final    NO GROWTH 1 DAY Performed at [facility], [address], [place], [place] [address]    Report Status PENDING  Incomplete    Labs: CBC: Recent Labs  Lab 10/13/23 1624 10/15/23 1100  WBC 6.4 4.1  NEUTROABS 4.5  --   HGB 13.4 12.4  HCT 40.8 34.9*  MCV 83.6 87.0  PLT 228 150   Basic Metabolic Panel: Recent Labs  Lab 10/13/23 1624 10/14/23 0616 10/15/23 1100  NA 141 140 136  K 3.9 3.6 3.7  CL 107 108 108  CO2 23 22 20*  GLUCOSE 126* 80 123*  BUN 10 10 6   CREATININE 0.71 0.69 0.65  CALCIUM 11.3* 10.8* 9.9   Liver Function Tests: Recent Labs  Lab 10/13/23 1624 10/14/23 0616  AST 21 19  ALT 18 17  ALKPHOS 82 76  BILITOT 0.5 0.5  PROT 7.6 6.9  ALBUMIN 3.9 3.6   CBG: Recent Labs  Lab 10/15/23 0620 10/15/23 1634 10/16/23 0009 10/16/23 0439 10/16/23 0807  GLUCAP 106* 82 114* 88 86    Discharge time spent: greater than 30 minutes.  Signed: Meredeth Ide, MD [facility] 10/16/2023

## 2023-10-16 NOTE — Plan of Care (Signed)

## 2023-10-16 NOTE — TOC Transition Note (Signed)
Transition of Care [facility]) - CM/SW Discharge Note   Patient Details  Name: Sarah Myers MRN: [id number] Date of Birth: [birth year]  [facility]) CM/SW Contact:  Daizha Anand A [place], LCSWA Phone Number: 10/16/2023, 1:27 PM   Clinical Narrative:     Patient will DC to: [facility], [facility]  Anticipated DC date: 10/16/23  Family notified: Janne Napoleon  Transport by: Sharin Mons      Per MD patient ready for DC to [facility]  RN, patient, patient's family, and facility notified of DC. Discharge Summary and FL2 sent to facility. RN to call report prior to discharge [phone].). DC packet on chart. Ambulance transport requested for patient.     CSW will sign off for now as social work intervention is no longer needed. Please consult [place] again if new needs arise.   Final next level of care: Skilled Nursing Facility Barriers to Discharge: Barriers Resolved   Patient Goals and CMS Choice      Discharge Placement                Patient chooses bed at:  [facility]) Patient to be transferred to facility by: [facility] Name of family member notified: Astella Coffelt Patient and family notified of of transfer: 10/16/23  Discharge Plan and Services Additional resources added to the After Visit Summary for                                       Social Determinants of Health (SDOH) Interventions SDOH Screenings   Depression (PHQ2-9): Low Risk  (08/23/2023)  Tobacco Use: Low Risk  (09/12/2023)     Readmission Risk Interventions    07/05/2022   11:29 AM 06/30/2022   12:52 PM 03/09/2022    9:54 AM  Readmission Risk Prevention Plan  Transportation Screening Complete Complete Complete  PCP or Specialist Appt within 5-7 Days  Complete   PCP or Specialist Appt within 3-5 Days Complete  Complete  Home Care Screening  Complete   Medication Review (RN CM)  Complete   HRI or Home Care Consult Complete  Not Complete  Social Work Consult for Recovery Care  Planning/Counseling Complete  Complete  Palliative Care Screening Complete    Medication Review Oceanographer) Complete  Complete

## 2023-10-16 NOTE — Progress Notes (Signed)
Called to give report to [facility]. Nurse was on lunch. Gave the nurse a call back number. Made them aware [facility] was here to pick patient up.

## 2023-10-16 NOTE — Progress Notes (Signed)
1/4 bottles with staph hominis/capitis. Likely to be contaminant. Ok to [employer] and watch per Dr. Sharl Ma.  Ulyses Southward, PharmD, BCIDP, AAHIVP, CPP Infectious Disease Pharmacist 10/16/2023 8:44 AM

## 2023-10-19 LAB — CULTURE, BLOOD (ROUTINE X 2)
Culture: NO GROWTH
Special Requests: ADEQUATE

## 2023-10-20 LAB — CULTURE, BLOOD (ROUTINE X 2)
Culture: NO GROWTH
Culture: NO GROWTH
Special Requests: ADEQUATE

## 2023-11-06 ENCOUNTER — Encounter: Payer: Medicaid Other | Attending: Physical Medicine and Rehabilitation | Admitting: Psychology

## 2023-11-06 ENCOUNTER — Encounter: Payer: Self-pay | Admitting: Psychology

## 2023-11-06 DIAGNOSIS — R413 Other amnesia: Secondary | ICD-10-CM | POA: Diagnosis present

## 2023-11-06 DIAGNOSIS — F331 Major depressive disorder, recurrent, moderate: Secondary | ICD-10-CM | POA: Diagnosis present

## 2023-11-06 DIAGNOSIS — F411 Generalized anxiety disorder: Secondary | ICD-10-CM | POA: Diagnosis present

## 2023-11-06 DIAGNOSIS — Z7409 Other reduced mobility: Secondary | ICD-10-CM | POA: Insufficient documentation

## 2023-11-06 DIAGNOSIS — G4701 Insomnia due to medical condition: Secondary | ICD-10-CM | POA: Insufficient documentation

## 2023-11-06 DIAGNOSIS — Z789 Other specified health status: Secondary | ICD-10-CM | POA: Diagnosis present

## 2023-11-06 DIAGNOSIS — R630 Anorexia: Secondary | ICD-10-CM | POA: Insufficient documentation

## 2023-11-06 NOTE — Progress Notes (Signed)
Neuropsychological Consultation   Patient:   Sarah Myers   DOB:   [birth year]  MR Number:  [id number]  Location:  [facility] A DEPT OF [facility] [address] [place], [place] [address] [place] [address] Dept: [phone]           Date of Service:   11/06/2023  Location of Service and Individuals present: Today's visit was conducted in my outpatient clinic office with the patient myself present.  Start Time:   11 AM End Time:   12 PM  Today's visit was 60 minutes in duration with 45 minutes spent face-to-face with the patient and the other 15 minutes with report writing and communicating with her other treating physicians.   Consent for Evaluation and Treatment:  Signed:  Yes Explanation of Privacy Policies:  Signed:  Yes Discussion of Confidentiality Limits:  Yes  Provider/Observer:  Arley Phenix, Psy.D.       Clinical Neuropsychologist       Billing Code/Service: [id number]  Chief Complaint:     Chief Complaint  Patient presents with   Depression   Anxiety   Memory Loss   Gait Problem   Stress   Visual Field Change    Reason for Service:    Sarah Myers is a 52 year old female referred for neuropsychological consultation by her treating physiatrist Sula Soda, MD due to ongoing issues related to impaired motor functioning and currently requiring wheelchair for mobility, left-sided weakness and limited/occasional memory issues and memory loss with confusion.  Patient with past medical history including history of hypertension, general anxiety disorder, and history of acute kidney difficulties and acute respiratory issues prior to her stroke.  Patient had presented to the emergency department on 02/05/2022 with left-sided weakness, slurred speech and hypertensive emergency.  Neuroimaging identified right basal ganglia hemorrhage with brain compression.  Stroke was  felt to be due to unmanaged high blood pressure.  Patient required intubation and ultimately tracheostomy and PEG placement later.  Patient was treated for MSSA pneumonia with CTA of chest on 3/7 revealing bilateral pulmonary emboli.  Patient was admitted onto the CIR rehab program on 04/04/2022 after [facility].  Patient worked with [facility] with PT, OT, speech therapy and medical care provided throughout his admission.  Blood pressure remained stable during his admission.  During CIR she had improvements in endurance, balance, postural control and developing compensation skills for deficits.  At discharge she was able to transition into standing with min assist and maintain mid to moderate assistance for standing.  Patient has been followed by Dr. Carlis Abbott in our outpatient physical medicine clinic postdischarge with ongoing issues with insomnia, spasticity in upper and lower extremities at times, swelling in limbs, frequent urination and impairments in mobility.  Patient was referred for neuropsychological consultation primarily for therapeutic interventions due to ongoing coping issues with a past history of anxiety.  Patient is still in a nursing facility at [facility] and has been there since July.  Patient continues with significant left-sided motor deficits and weakness and can only move her right side primarily.  There is visual neglect noted still for left visual field and deficits with weakness and mobility issues.  Patient has had ongoing issues with sleep and at times some visual hallucinations.  Patient noted that she has concerns about the development of early dementia due to memory loss although this is  likely directly related to cerebrovascular accident.  Patient and family report that the sudden changes have a created a significant physical, emotional and mental effect on all of the family and they have been working hard to try to maintain the  patient's overall wellbeing, motivation and mood state.  Most recent head CT was produced on 04/25/2023 after fall and there were no acute process noted but generalized atrophy without lobar predilection.  There was hypoattenuation in the periventricular white matter typically associated with chronic ischemic microangiopathic/small vessel disease and also noted left occipital lobe infarct.  The patient has had a number of other CT scans performed since dischargeAfter fall with after falls with facial injury and blunt trauma noting stable remote infarcts but no acute process.  MRI brain was performed while the patient was hospitalized that was done without contrast.  Interpretation was produced by Dr. Allena Katz.  Acute to early subacute large parenchymal hemorrhage was noted with surrounding edema and large regional mass effect.  Intraventricular extension was noted.  Region was identified in the right basal ganglia, surrounding white matter regions and extending into the temporal lobe.  There was noted T2 hyperintense foci in white matter regions reflecting chronic microvascular ischemic changes.  11/06/2023: Today, the patient appeared much worse than when I saw her the first time.  She remains slumped over in her wheelchair much of the visit.  She was able to display adequate receptive and expressive language capacity but perseverated the whole time about wanting to go home and was clearly very depressed but consistent and adamant about wanting to go home.  Patient reports that her family would be able to take care of her and she has her own house that her daughter lives there now that she could go to.  Patient reports that there were steps to get inside but reports that her bedroom is on the same floor as kitchen and living room etc.  I was not able to confirm any of these details.  As this is not the specific issue we addressed the first visit I am unsure of some of the specific details regarding her insistence  that her family would be able to take care of her but did address issue that it is not a question about whether the doctors would allow it or not allow it but the issue that she is unable to independently perform ADLs and would need essentially 24-hour assistance whether it was at home or in a skilled nursing facility.  The patient reports that they are treating her well at the skilled nursing facility and does not have concerned about that aspect of her care but reports that she desperately wants to be with her loved ones and that is her almost sole focus.  I addressed these issues with the patient and the requirement that someone be available to take care of her and that the requirement was not directly requiring skilled nursing facility as cognitively she is able to learn new information and express herself adequately that it really has to do with her physical capacity to execute specific ADLs, which she is unable to do independently.  I will talk with Dr. Loleta Chance around the worsening of symptoms.  The patient was recently admitted to the hospital after ED presentation due to altered mental status and extensive cardiovascular and cerebrovascular history.  The patient was discharged with her mental status returning back to baseline, negative CT and workup not suggestive of any type of infection.  The causative factor  was felt to do to fecal impaction, constipation and resolved after manual disimpaction and enema with CT identifying large rectal stool ball with right sided colon stool burden.  Medical History:   Past Medical History:  Diagnosis Date   Hypertension          Patient Active Problem List   Diagnosis Date Noted   AMS (altered mental status) 10/13/2023   Constipation 10/13/2023   Hallucination, visual 12/12/2022   Acute cystitis 11/28/2022   Hypokalemia 11/28/2022   COVID-19 11/27/2022   Palliative care by specialist    Goals of care, counseling/discussion    Acute encephalopathy  06/28/2022   Bacteremia 06/28/2022   Metabolic acidosis 06/28/2022   Sepsis due to gram-negative UTI (HCC) 06/24/2022   Essential hypertension 06/24/2022   History of stroke 06/24/2022   History of pulmonary embolism 06/24/2022   Anxiety and depression 06/24/2022   Acute metabolic encephalopathy 06/24/2022   Altered mental status 06/23/2022   ICH (intracerebral hemorrhage) (HCC) 04/04/2022   Hypercalcemia 03/21/2022   Oral thrush 03/13/2022   Dysphagia 03/08/2022   Counseling regarding goals of care 03/08/2022   Tracheostomy dependent (HCC)    Bilateral pulmonary embolism (HCC)    AKI (acute kidney injury) (HCC)    Acute on chronic respiratory failure with hypoxia (HCC)    Pressure injury of skin 02/07/2022   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  There was hypoattenuation in the periventricular white matter typically associated with chronic ischemic microangiopathic/small vessel disease and also noted left occipital lobe infarct.  The patient has had a number of other CT scans performed since dischargeAfter fall with after falls with facial injury and blunt trauma noting stable remote infarcts but no acute process.  MRI brain was performed while the patient was hospitalized that was done without contrast.  Interpretation was produced by Dr. Allena Katz.  Acute to early subacute large parenchymal hemorrhage was noted with surrounding edema and large regional mass effect.  Intraventricular extension was noted.  Region was identified in the right basal ganglia, surrounding white matter  regions and extending into the temporal lobe.  There was noted T2 hyperintense foci in white matter regions reflecting chronic microvascular ischemic changes.   Sleep: Patient's sleep is described as fine with no significant issues and no identifications of obstructive sleep apnea other pulmonary issues.    Diet Pattern: Appetite is described as fair/good and she does have a relatively  stable diet where she is living.    Behavioral Observation/Mental Status:   RILLEY VECELLIO  presents as a 52 y.o.-year-old Right handed African American Female who appeared her stated age. her dress was Appropriate and she was Well Groomed and her manners were Appropriate to the situation.  her participation was indicative of Inattentive and Redirectable behaviors.  There were physical disabilities noted.  she displayed an appropriate level of cooperation and motivation.    Interactions:    Active Appropriate and Redirectable  Attention:   abnormal and attention span and concentration were age appropriate  Memory:   abnormal; remote memory intact, recent memory impaired  Visuo-spatial:   not examined But some issues of  neglect have been noted  Speech (Volume):  low  Speech:   normal; slurred  Thought Process:  Coherent and Relevant  Coherent and Directed  Though Content:  WNL; not suicidal and not homicidal  Orientation:   person, place, and situation  Judgment:   Fair  Planning:   Fair  Affect:    Anxious and Depressed  Mood:    Anxious and Dysphoric  Insight:   Shallow  Intelligence:   normal  Marital Status/Living:  Patient was born and raised in [place] [facility] [place] along with 3 siblings.  She is currently living in a skilled nursing facility as well as spending time at home with her daughter.  Patient is widowed.  Patient has 4 adult children age 45, 63, 7 and a son in his mid 62s.  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  Hobbies and Interests: Hobbies and interests have included reading, writing, watching wrestling.  Impact of Symptoms on Work or School:  Patient has not been able to return to work.  Impact of Symptoms on Social Functioning and Interpersonal Relationships: Significant stress on the entire family due to motor impairments.   Psychiatric History:    Abuse/Trauma History: Patient had been diagnosed with generalized anxiety disorder back in 2021.  Patient continues to take BuSpar to help with anxiety that was started posthospital.  Patient is also been taking sertraline for mood.  History of Substance Use or Abuse:  No concerns of substance abuse are reported.  Family Med/Psych History:  Family History  Problem Relation Age of Onset   Diabetes Mother    Hypertension Mother    Heart disease Mother    Diabetes Maternal Aunt    Stroke Maternal Uncle    Diabetes Maternal Grandmother    Heart disease Maternal Grandmother    Diabetes Maternal Grandfather    Heart disease Maternal Grandfather    Heart disease Paternal Grandmother    Heart disease Paternal Grandfather      Impression/DX:   MISSEY MCCRIGHT is a 52 year old female referred for neuropsychological consultation by her treating physiatrist Sula Soda, MD due to ongoing issues related to impaired motor functioning and currently requiring wheelchair for mobility, left-sided weakness and limited/occasional memory issues and memory loss with confusion.  Patient with past medical history including history of hypertension, general anxiety disorder, and history of acute kidney difficulties and acute respiratory issues prior to her stroke.  Patient had presented to the emergency department on 02/05/2022 with left-sided weakness, slurred speech and hypertensive  emergency.  Neuroimaging identified right basal ganglia hemorrhage with brain compression.  Stroke was felt to be due to unmanaged high blood pressure.  Patient required intubation and ultimately tracheostomy and PEG placement later.  Patient was treated for MSSA pneumonia with CTA of chest on 3/7 revealing bilateral pulmonary emboli.  Patient was admitted onto the CIR rehab program on 04/04/2022 after [facility].  Patient worked with [facility] with PT, OT, speech therapy and medical care provided throughout his admission.  Blood pressure remained stable during his admission.  During CIR she had improvements in endurance, balance, postural control and developing compensation skills for deficits.  At discharge she was able to transition into standing with min assist  and maintain mid to moderate assistance for standing.  Patient has been followed by Dr. Carlis Abbott in our outpatient physical medicine clinic postdischarge with ongoing issues with insomnia, spasticity in upper and lower extremities at times, swelling in limbs, frequent urination and impairments in mobility.  Patient was referred for neuropsychological consultation primarily for therapeutic interventions due to ongoing coping issues with a past history of anxiety.  11/06/2023: Today, the patient appeared much worse than when I saw her the first time.  She remains slumped over in her wheelchair much of the visit.  She was able to display adequate receptive and expressive language capacity but perseverated the whole time about wanting to go home and was clearly very depressed but consistent and adamant about wanting to go home.  Patient reports that her family would be able to take care of her and she has her own house that her daughter lives there now that she could go to.  Patient reports that there were steps to get inside but reports that her bedroom is on the same floor as kitchen and living room etc.  I was not  able to confirm any of these details.  As this is not the specific issue we addressed the first visit I am unsure of some of the specific details regarding her insistence that her family would be able to take care of her but did address issue that it is not a question about whether the doctors would allow it or not allow it but the issue that she is unable to independently perform ADLs and would need essentially 24-hour assistance whether it was at home or in a skilled nursing facility.  The patient reports that they are treating her well at the skilled nursing facility and does not have concerned about that aspect of her care but reports that she desperately wants to be with her loved ones and that is her almost sole focus.  I addressed these issues with the patient and the requirement that someone be available to take care of her and that the requirement was not directly requiring skilled nursing facility as cognitively she is able to learn new information and express herself adequately that it really has to do with her physical capacity to execute specific ADLs, which she is unable to do independently.  I will talk with Dr. Loleta Chance around the worsening of symptoms.  The patient was recently admitted to the hospital after ED presentation due to altered mental status and extensive cardiovascular and cerebrovascular history.  The patient was discharged with her mental status returning back to baseline, negative CT and workup not suggestive of any type of infection.  The causative factor was felt to do to fecal impaction, constipation and resolved after manual disimpaction and enema with CT identifying large rectal stool ball with right sided colon stool burden.  Disposition/Plan:  The patient is scheduled return for return follow-up visit and I will speak with her attending physiatrist in relationship to the note that is available above for the 11/19 note.  Her nursing facility has requested a copy of this and this  will be forwarded to them as well.  Diagnosis:    Moderate episode of recurrent major depressive disorder (HCC)  GAD (generalized anxiety disorder)  Decreased appetite  Insomnia due to medical condition  Impaired memory  Impaired mobility and activities of daily living        Note: This document was prepared using Dragon voice recognition software and may include unintentional dictation errors.  Electronically Signed   _______________________ Arley Phenix, Psy.D. Clinical Neuropsychologist

## 2023-11-08 ENCOUNTER — Encounter (HOSPITAL_COMMUNITY): Payer: Self-pay

## 2023-11-08 ENCOUNTER — Emergency Department (HOSPITAL_COMMUNITY)
Admission: EM | Admit: 2023-11-08 | Discharge: 2023-11-09 | Disposition: A | Discharge status: Home / Self Care | Payer: Medicaid Other | Attending: Emergency Medicine | Admitting: Emergency Medicine

## 2023-11-08 ENCOUNTER — Emergency Department (HOSPITAL_COMMUNITY): Payer: Medicaid Other

## 2023-11-08 DIAGNOSIS — Z853 Personal history of malignant neoplasm of breast: Secondary | ICD-10-CM | POA: Diagnosis not present

## 2023-11-08 DIAGNOSIS — R569 Unspecified convulsions: Secondary | ICD-10-CM | POA: Insufficient documentation

## 2023-11-08 DIAGNOSIS — Z7901 Long term (current) use of anticoagulants: Secondary | ICD-10-CM | POA: Diagnosis not present

## 2023-11-08 DIAGNOSIS — I10 Essential (primary) hypertension: Secondary | ICD-10-CM | POA: Diagnosis not present

## 2023-11-08 DIAGNOSIS — Z8616 Personal history of COVID-19: Secondary | ICD-10-CM | POA: Insufficient documentation

## 2023-11-08 DIAGNOSIS — W050XXA Fall from non-moving wheelchair, initial encounter: Secondary | ICD-10-CM | POA: Diagnosis not present

## 2023-11-08 DIAGNOSIS — Z79899 Other long term (current) drug therapy: Secondary | ICD-10-CM | POA: Insufficient documentation

## 2023-11-08 HISTORY — DX: Cerebral infarction, unspecified: I63.9

## 2023-11-08 LAB — COMPREHENSIVE METABOLIC PANEL
ALT: 19 U/L (ref 0–44)
AST: 33 U/L (ref 15–41)
Albumin: 4 g/dL (ref 3.5–5.0)
Alkaline Phosphatase: 71 U/L (ref 38–126)
Anion gap: 11 (ref 5–15)
BUN: 26 mg/dL — ABNORMAL HIGH (ref 6–20)
CO2: 21 mmol/L — ABNORMAL LOW (ref 22–32)
Calcium: 11.9 mg/dL — ABNORMAL HIGH (ref 8.9–10.3)
Chloride: 110 mmol/L (ref 98–111)
Creatinine, Ser: 0.93 mg/dL (ref 0.44–1.00)
GFR, Estimated: >60 mL/min (ref >60)
Glucose, Bld: 123 mg/dL — ABNORMAL HIGH (ref 70–99)
Potassium: 3.8 mmol/L (ref 3.5–5.1)
Sodium: 142 mmol/L (ref 135–145)
Total Bilirubin: 0.9 mg/dL (ref <1.2)
Total Protein: 7.6 g/dL (ref 6.5–8.1)

## 2023-11-08 LAB — CBC WITH DIFFERENTIAL/PLATELET
Abs Immature Granulocytes: 0.01 10*3/uL (ref 0.00–0.07)
Basophils Absolute: 0 10*3/uL (ref 0.0–0.1)
Basophils Relative: 0 %
Eosinophils Absolute: 0 10*3/uL (ref 0.0–0.5)
Eosinophils Relative: 0 %
HCT: 39.1 % (ref 36.0–46.0)
Hemoglobin: 13.2 g/dL (ref 12.0–15.0)
Immature Granulocytes: 0 %
Lymphocytes Relative: 10 %
Lymphs Abs: 0.8 10*3/uL (ref 0.7–4.0)
MCH: 28.9 pg (ref 26.0–34.0)
MCHC: 33.8 g/dL (ref 30.0–36.0)
MCV: 85.7 fL (ref 80.0–100.0)
Monocytes Absolute: 0.2 10*3/uL (ref 0.1–1.0)
Monocytes Relative: 3 %
Neutro Abs: 6.9 10*3/uL (ref 1.7–7.7)
Neutrophils Relative %: 87 %
Platelets: 196 10*3/uL (ref 150–400)
RBC: 4.56 MIL/uL (ref 3.87–5.11)
RDW: 13.1 % (ref 11.5–15.5)
WBC: 7.9 10*3/uL (ref 4.0–10.5)
nRBC: 0 % (ref 0.0–0.2)

## 2023-11-08 LAB — URINALYSIS, ROUTINE W REFLEX MICROSCOPIC
Bilirubin Urine: NEGATIVE
Glucose, UA: NEGATIVE mg/dL
Hgb urine dipstick: NEGATIVE
Ketones, ur: 20 mg/dL — AB
Leukocytes,Ua: NEGATIVE
Nitrite: NEGATIVE
Protein, ur: NEGATIVE mg/dL
Specific Gravity, Urine: 1.016 (ref 1.005–1.030)
pH: 5 (ref 5.0–8.0)

## 2023-11-08 MED ORDER — LACTATED RINGERS IV SOLN: INTRAVENOUS | Status: AC

## 2023-11-08 MED ORDER — LORAZEPAM 2 MG/ML IJ SOLN
1.0000 mg | Freq: Once | INTRAMUSCULAR | Status: AC
  Administered 2023-11-08: 1 mg via INTRAMUSCULAR
  Filled 2023-11-08: qty 1

## 2023-11-08 MED ORDER — LACTATED RINGERS IV BOLUS
1000.0000 mL | Freq: Once | INTRAVENOUS | Status: AC
  Administered 2023-11-08: 1000 mL via INTRAVENOUS

## 2023-11-08 MED ORDER — LORAZEPAM 2 MG/ML IJ SOLN
0.5000 mg | Freq: Once | INTRAMUSCULAR | Status: AC
  Administered 2023-11-08: 0.5 mg via INTRAVENOUS
  Filled 2023-11-08: qty 1

## 2023-11-08 MED ORDER — LORAZEPAM 2 MG/ML IJ SOLN: 1.0000 mg | Freq: Once | INTRAMUSCULAR | Status: DC

## 2023-11-08 NOTE — ED Notes (Signed)
IV infiltrated    IV was removed

## 2023-11-08 NOTE — ED Notes (Signed)
[facility] was called.  

## 2023-11-08 NOTE — ED Provider Notes (Signed)
Patient signed out to me by Dr. Earle Gell pending results of urinalysis.  Patient more awake and alert now.  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  Recently had a visit with a neuropsychologist 2 days ago for cognitive decline.  I feel that she is back at her baseline at this time.  Urinalysis negative for infection.  Will discharge back to facility   Lorre Nick, MD 11/08/23 [id number]

## 2023-11-08 NOTE — ED Provider Notes (Signed)
[place] EMERGENCY DEPARTMENT AT [facility] Provider Note  CSN: [id number] Arrival date & time: 11/08/23 1344  Chief Complaint(s) Seizures  HPI Sarah Myers is a 52 y.o. 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  History is limited but multiple residents at the patient's skilled nursing facility saw the patient reportedly have a seizure, fall out of her wheelchair and strike her head against the ground.  Here in the emergency ferment, patient is significant altered rolling around the bed pointing at her left hip.  Only intermittently able to answer questions.  Recently admitted on 1026 for delirium and a similar episode.  Additional history unable to be obtained as patient is currently altered.   Past Medical History Past Medical History:  Diagnosis Date   Hypertension    Stroke [facility])    Patient Active Problem List   Diagnosis Date Noted   AMS (altered mental status) 10/13/2023   Constipation 10/13/2023   Hallucination, visual 12/12/2022   Acute cystitis 11/28/2022   Hypokalemia 11/28/2022   COVID-19 11/27/2022   Palliative care by specialist    Goals of care, counseling/discussion    Acute encephalopathy 06/28/2022   Bacteremia 06/28/2022   Metabolic acidosis 06/28/2022   Sepsis due to gram-negative UTI (HCC) 06/24/2022   Essential hypertension 06/24/2022   History of stroke 06/24/2022   History of pulmonary embolism 06/24/2022   Anxiety and depression 06/24/2022   Acute metabolic encephalopathy 06/24/2022   Altered mental status 06/23/2022   ICH (intracerebral hemorrhage) (HCC) 04/04/2022   Hypercalcemia 03/21/2022   Oral thrush 03/13/2022   Dysphagia 03/08/2022   Counseling regarding goals of care 03/08/2022   Tracheostomy dependent [facility])    Bilateral pulmonary embolism (HCC)    AKI (acute  kidney injury) (HCC)    Acute on chronic respiratory failure with hypoxia (HCC)    Pressure injury of skin 02/07/2022   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 Authorizing Provider  acetaminophen (TYLENOL) 500 MG tablet Take 1,000 mg by mouth every 6 (six) hours as needed (pain).    [provider]  Amantadine HCl 100 MG tablet Take 100 mg by mouth 2 (two) times daily. 09/15/22   [provider]  amLODipine (NORVASC) 10 MG tablet Take 1 tablet (10 mg total) by mouth daily. 06/22/22   Raulkar, Drema Pry, MD  apixaban (ELIQUIS) 5 MG TABS tablet Take 1 tablet (5 mg total) by mouth 2 (two) times daily. 06/22/22   Raulkar, Drema Pry, MD  Baclofen 15 MG TABS Take 15 mg by mouth 3 (three) times daily.    [provider]  buPROPion (WELLBUTRIN) 75 MG tablet Take 75 mg by mouth 2 (two) times daily.    [provider]  carvedilol (COREG) 6.25 MG tablet Take 1 tablet (6.25 mg total) by mouth 2 (two) times daily with a meal. 06/22/22   Raulkar, Drema Pry, MD  cholecalciferol (VITAMIN D3) 25 MCG (1000 UNIT) tablet Take 1,000 Units by mouth daily.    [provider]  famotidine (PEPCID) 20 MG tablet Take 1 tablet (20 mg total) by mouth 2 (two) times daily. 05/22/22  Horton Chin, MD  gabapentin (NEURONTIN) 100 MG capsule Take 1 capsule (100 mg total) by mouth 2 (two) times daily. 05/24/22   Raulkar, Drema Pry, MD  hydrALAZINE (APRESOLINE) 25 MG tablet Take 25 mg by mouth every 6 (six) hours. 10/10/22   [provider]  hydrOXYzine (VISTARIL) 50 MG capsule Take 50 mg by mouth at bedtime.    [provider]  losartan (COZAAR) 25 MG tablet Take 1 tablet (25 mg total) by mouth  daily. 06/09/22   Raulkar, Drema Pry, MD  magnesium gluconate (MAGONATE) 500 MG tablet Take 1 tablet (500 mg total) by mouth at bedtime. 05/22/22   Raulkar, Drema Pry, MD  mirtazapine (REMERON) 7.5 MG tablet Take 7.5 mg by mouth at bedtime.    [provider]  Multiple Vitamin (MULTIVITAMIN WITH MINERALS) TABS tablet Take 1 tablet by mouth daily. 05/22/22   Raulkar, Drema Pry, MD  Nutritional Supplements (NUTRITIONAL DRINK PO) Take 1 Container by mouth See admin instructions. 2 entries on MAR: 1) House shake, twice daily at 1000 and 1400 2) House supplement, three times daily at 1000, 1600, and 2200    [provider]  Nutritional Supplements (NUTRITIONAL SUPPLEMENT PO) Take 1 each by mouth 2 (two) times daily. Frozen nutritional treat    [provider]  ondansetron (ZOFRAN) 4 MG tablet Take 1 tablet (4 mg total) by mouth every 8 (eight) hours as needed for nausea or vomiting. 10/17/22   Tegeler, Canary Brim, MD  polyethylene glycol (MIRALAX / GLYCOLAX) 17 g packet Take 17 g by mouth daily. 10/17/23   Meredeth Ide, MD  saccharomyces boulardii (FLORASTOR) 250 MG capsule Take 250 mg by mouth daily.    [provider]                                                                                                                                    Past Surgical History Past Surgical History:  Procedure Laterality Date   BREAST LUMPECTOMY  1992   CESAREAN SECTION     IR GASTROSTOMY TUBE MOD SED  03/06/2022   ORIF RADIAL FRACTURE  02/10/2012   Procedure: OPEN REDUCTION INTERNAL FIXATION (ORIF) RADIAL FRACTURE;  Surgeon: Cammy Copa, MD;  Location: [facility] OR;  Service: Orthopedics;  Laterality: Right;   ORIF ULNAR FRACTURE  02/10/2012   Procedure: OPEN REDUCTION INTERNAL FIXATION (ORIF) ULNAR FRACTURE;  Surgeon: Cammy Copa, MD;  Location: [facility] OR;  Service: Orthopedics;  Laterality: Right;   TUBAL LIGATION     Family History Family History  Problem Relation  Age of Onset   Diabetes Mother    Hypertension Mother    Heart disease Mother    Diabetes Maternal Aunt    Stroke Maternal Uncle    Diabetes Maternal Grandmother    Heart disease Maternal Grandmother    Diabetes Maternal Grandfather    Heart disease Maternal Grandfather    Heart  disease Paternal Grandmother    Heart disease Paternal Grandfather     Social History Social History   Tobacco Use   Smoking status: Never   Smokeless tobacco: Never  Vaping Use   Vaping status: Never Used  Substance Use Topics   Alcohol use: Not Currently    Alcohol/week: 0.0 standard drinks of alcohol   Drug use: No   Allergies Zestril [lisinopril]  Review of Systems Review of Systems  Unable to perform ROS: Mental status change    Physical Exam Vital Signs  I have reviewed the triage vital signs BP (!) 181/104   Pulse 66   Temp 97.7 F (36.5 C) (Axillary)   Resp 18   Ht 5\' 3"  (1.6 m)   Wt 44 kg   SpO2 100%   BMI 17.18 kg/m   Physical Exam Vitals and nursing note reviewed.  Constitutional:      General: She is not in acute distress.    Appearance: She is well-developed.  HENT:     Head: Normocephalic.  Eyes:     Conjunctiva/sclera: Conjunctivae normal.  Cardiovascular:     Rate and Rhythm: Normal rate and regular rhythm.     Heart sounds: No murmur heard. Pulmonary:     Effort: Pulmonary effort is normal. No respiratory distress.     Breath sounds: Normal breath sounds.  Abdominal:     Palpations: Abdomen is soft.     Tenderness: There is no abdominal tenderness.  Musculoskeletal:        General: Tenderness present. No swelling.     Cervical back: Neck supple.  Skin:    General: Skin is warm and dry.     Capillary Refill: Capillary refill takes less than 2 seconds.  Neurological:     Mental Status: She is alert.  Psychiatric:        Mood and Affect: Mood normal.     ED Results and Treatments Labs (all labs ordered are listed, but only abnormal results are  displayed) Labs Reviewed  COMPREHENSIVE METABOLIC PANEL - Abnormal; Notable for the following components:      Result Value   CO2 21 (*)    Glucose, Bld 123 (*)    BUN 26 (*)    Calcium 11.9 (*)    All other components within normal limits  URINALYSIS, ROUTINE W REFLEX MICROSCOPIC - Abnormal; Notable for the following components:   Ketones, ur 20 (*)    All other components within normal limits  CBC WITH DIFFERENTIAL/PLATELET                                                                                                                          Radiology CT Head Wo Contrast  Result Date: 11/08/2023 CLINICAL DATA:  Seizure, new-onset, history of trauma EXAM: CT HEAD WITHOUT CONTRAST TECHNIQUE: Contiguous axial images were obtained from the base of the skull through the vertex without intravenous contrast. RADIATION DOSE REDUCTION: This exam was performed according to the  departmental dose-optimization program which includes automated exposure control, adjustment of the mA and/or kV according to patient size and/or use of iterative reconstruction technique. COMPARISON:  Brain MR 10/13/23, CT head 10/13/23 FINDINGS: Brain: No hemorrhage. No hydrocephalus. No extra-axial fluid collection. There is chronic infarct in the left occipital lobe. No CT evidence of an acute cortical infarct. No mass effect. No mass lesion. There is mineralization of the basal ganglia bilaterally. There is asymmetric atrophy of the right thalamus, unchanged from prior exam. Vascular: No hyperdense vessel or unexpected calcification. Skull: Normal. Negative for fracture or focal lesion. Sinuses/Orbits: Middle ear or mastoid effusion. Paranasal sinuses are notable mucosal thickening and likely mucoceles in the bilateral ethmoid sinuses Other: None. IMPRESSION: No CT evidence of intracranial injury. No CT finding to explain seizures. Electronically Signed   By: Lorenza Cambridge M.D.   On: 11/08/2023 16:46   DG Hip Unilat W or Wo  Pelvis 2-3 Views Right  Result Date: 11/08/2023 CLINICAL DATA:  Fall. EXAM: DG HIP (WITH OR WITHOUT PELVIS) 2-3V RIGHT COMPARISON:  CT abdomen/pelvis dated 10/13/2023 FINDINGS: There is no evidence of hip fracture or dislocation. The femoral heads are seated within the acetabula. The sacroiliac joints and pubic symphysis appear anatomically aligned. IMPRESSION: No acute osseous abnormality. Electronically Signed   By: Hart Robinsons M.D.   On: 11/08/2023 16:33    Pertinent labs & imaging results that were available during my care of the patient were reviewed by me and considered in my medical decision making (see MDM for details).  Medications Ordered in ED Medications  lactated ringers infusion ( Intravenous New Bag/Given 11/08/23 1956)  LORazepam (ATIVAN) injection 1 mg (1 mg Intramuscular Given 11/08/23 1448)  lactated ringers bolus 1,000 mL (0 mLs Intravenous Stopped 11/08/23 1916)  LORazepam (ATIVAN) injection 0.5 mg (0.5 mg Intravenous Given 11/08/23 1800)  lactated ringers bolus 1,000 mL (0 mLs Intravenous Stopped 11/08/23 2117)                                                                                                                                     Procedures Procedures  (including critical care time)  Medical Decision Making / ED Course   This patient presents to the ED for concern of seizure, this involves an extensive number of treatment options, and is a complaint that carries with it a high risk of complications and morbidity.  The differential diagnosis includes medication noncompliance, meningitis, posterior reversible encephalopathy syndrome, hyponatremia, convulsive syncope, focal lesion/mass, head trauma, intracerebral hemorrhage, toxins/recreational drugs, pseudoseizure  MDM: Patient seen emergency room for evaluation of a seizure.  Physical exam reveals encephalopathic patient rolling around in the bed pointing at her hip.  Baseline mental status is unknown.   Neurologic exam unremarkable outside of known left-sided hemiplegia.  No new neurologic exam.  Patient requiring Ativan administration to allow for medical evaluation in the setting of possible altered mental status.  Laboratory evaluation  with a calcium of 11.9 but is otherwise unremarkable.  X-ray of the hip is reassuringly negative for fracture, CT head unremarkable.  At time of signout, patient pending urinalysis and reevaluation by oncoming provider.  Please see provider signout note for continuation of workup   Additional history obtained:  -External records from outside source obtained and reviewed including: Chart review including previous notes, labs, imaging, consultation notes   Lab Tests: -I ordered, reviewed, and interpreted labs.   The pertinent results include:   Labs Reviewed  COMPREHENSIVE METABOLIC PANEL - Abnormal; Notable for the following components:      Result Value   CO2 21 (*)    Glucose, Bld 123 (*)    BUN 26 (*)    Calcium 11.9 (*)    All other components within normal limits  URINALYSIS, ROUTINE W REFLEX MICROSCOPIC - Abnormal; Notable for the following components:   Ketones, ur 20 (*)    All other components within normal limits  CBC WITH DIFFERENTIAL/PLATELET       Imaging Studies ordered: I ordered imaging studies including hip x-ray, CT head I independently visualized and interpreted imaging. I agree with the radiologist interpretation   Medicines ordered and prescription drug management: Meds ordered this encounter  Medications   DISCONTD: LORazepam (ATIVAN) injection 1 mg   LORazepam (ATIVAN) injection 1 mg   lactated ringers bolus 1,000 mL   LORazepam (ATIVAN) injection 0.5 mg   lactated ringers bolus 1,000 mL   lactated ringers infusion    -I have reviewed the patients home medicines and have made adjustments as needed  Critical interventions none    Cardiac Monitoring: The patient was maintained on a cardiac monitor.  I  personally viewed and interpreted the cardiac monitored which showed an underlying rhythm of: NSR  Social Determinants of Health:  Factors impacting patients care include: Lives in the facility   Reevaluation: After the interventions noted above, I reevaluated the patient and found that they have :improved  Co morbidities that complicate the patient evaluation  Past Medical History:  Diagnosis Date   Hypertension    Stroke [facility])       Dispostion: I considered admission for this patient, and disposition pending completion of laboratory evaluation.  Please see provider sentiment for continuation of workup.     Final Clinical Impression(s) / ED Diagnoses Final diagnoses:  Seizure [facility])     @PCDICTATION @    Glendora Score, MD 11/08/23 [phone]

## 2023-11-08 NOTE — ED Triage Notes (Signed)
Patient BIB [facility] from [facility]. Called out after patient was sitting in wheelchair and other residents said the patient had a seizure and fell out of wheelchair. History of stroke and left sided paralysis. Patient tearful on arrival saying "take it off it hurts." Patient takes Eloquis. Unknown if patient hit head. Blood on side of face.

## 2023-11-08 NOTE — ED Notes (Addendum)
In and out cath was performed   no urine was retrieved   was told by Dr to encourage drinkable fluids

## 2023-11-08 NOTE — ED Notes (Signed)
Spoke to patients daughter. Update provided.

## 2023-11-10 ENCOUNTER — Emergency Department (HOSPITAL_COMMUNITY)
Admission: EM | Admit: 2023-11-10 | Discharge: 2023-11-11 | Disposition: A | Discharge status: Skilled Nursing Facility | Payer: Medicaid Other | Attending: Emergency Medicine | Admitting: Emergency Medicine

## 2023-11-10 ENCOUNTER — Emergency Department (HOSPITAL_COMMUNITY): Payer: Medicaid Other

## 2023-11-10 DIAGNOSIS — Z7901 Long term (current) use of anticoagulants: Secondary | ICD-10-CM | POA: Insufficient documentation

## 2023-11-10 DIAGNOSIS — S0003XA Contusion of scalp, initial encounter: Secondary | ICD-10-CM | POA: Insufficient documentation

## 2023-11-10 DIAGNOSIS — W19XXXA Unspecified fall, initial encounter: Secondary | ICD-10-CM | POA: Insufficient documentation

## 2023-11-10 DIAGNOSIS — S0990XA Unspecified injury of head, initial encounter: Secondary | ICD-10-CM | POA: Diagnosis present

## 2023-11-10 LAB — CBC WITH DIFFERENTIAL/PLATELET
Abs Immature Granulocytes: 0.01 10*3/uL (ref 0.00–0.07)
Basophils Absolute: 0 10*3/uL (ref 0.0–0.1)
Basophils Relative: 1 %
Eosinophils Absolute: 0.1 10*3/uL (ref 0.0–0.5)
Eosinophils Relative: 2 %
HCT: 40 % (ref 36.0–46.0)
Hemoglobin: 13.5 g/dL (ref 12.0–15.0)
Immature Granulocytes: 0 %
Lymphocytes Relative: 37 %
Lymphs Abs: 2 10*3/uL (ref 0.7–4.0)
MCH: 28.2 pg (ref 26.0–34.0)
MCHC: 33.8 g/dL (ref 30.0–36.0)
MCV: 83.5 fL (ref 80.0–100.0)
Monocytes Absolute: 0.3 10*3/uL (ref 0.1–1.0)
Monocytes Relative: 6 %
Neutro Abs: 3.1 10*3/uL (ref 1.7–7.7)
Neutrophils Relative %: 54 %
Platelets: 211 10*3/uL (ref 150–400)
RBC: 4.79 MIL/uL (ref 3.87–5.11)
RDW: 12.8 % (ref 11.5–15.5)
WBC: 5.5 10*3/uL (ref 4.0–10.5)
nRBC: 0 % (ref 0.0–0.2)

## 2023-11-10 LAB — COMPREHENSIVE METABOLIC PANEL
ALT: 14 U/L (ref 0–44)
AST: 39 U/L (ref 15–41)
Albumin: 3.6 g/dL (ref 3.5–5.0)
Alkaline Phosphatase: 70 U/L (ref 38–126)
Anion gap: 10 (ref 5–15)
BUN: 10 mg/dL (ref 6–20)
CO2: 20 mmol/L — ABNORMAL LOW (ref 22–32)
Calcium: 10.5 mg/dL — ABNORMAL HIGH (ref 8.9–10.3)
Chloride: 108 mmol/L (ref 98–111)
Creatinine, Ser: 0.61 mg/dL (ref 0.44–1.00)
GFR, Estimated: >60 mL/min (ref >60)
Glucose, Bld: 98 mg/dL (ref 70–99)
Potassium: 3.8 mmol/L (ref 3.5–5.1)
Sodium: 138 mmol/L (ref 135–145)
Total Bilirubin: 1.2 mg/dL — ABNORMAL HIGH (ref <1.2)
Total Protein: 6.8 g/dL (ref 6.5–8.1)

## 2023-11-10 LAB — CK: Total CK: 354 U/L — ABNORMAL HIGH (ref 38–234)

## 2023-11-10 MED ORDER — HALOPERIDOL LACTATE 5 MG/ML IJ SOLN
5.0000 mg | Freq: Once | INTRAMUSCULAR | Status: AC
  Administered 2023-11-10: 5 mg via INTRAMUSCULAR
  Filled 2023-11-10: qty 1

## 2023-11-10 MED ORDER — LORAZEPAM 2 MG/ML IJ SOLN
1.0000 mg | Freq: Once | INTRAMUSCULAR | Status: AC
  Administered 2023-11-10: 1 mg via INTRAMUSCULAR
  Filled 2023-11-10: qty 1

## 2023-11-10 NOTE — ED Notes (Signed)
Pt returns from CT unwilling to cooperate with imaging. PA informed

## 2023-11-10 NOTE — ED Triage Notes (Signed)
Pt BIB [facility] from [facility] following a unwitnessed fall. Per staff around 12:30 pm pt fell out of bed. Pt on eliquis

## 2023-11-10 NOTE — ED Notes (Signed)
Daughter Tambrey Donate [phone] would like an update asap

## 2023-11-10 NOTE — ED Notes (Signed)
Patient transported to CT 

## 2023-11-10 NOTE — ED Notes (Signed)
Pt has been uncooperative with staff. On arrival pt was unwilling to respond to questions. Pt also unwilling to cooperate with physical assessment. Pt sts "dont touch me." Pt refused initial XR and blood work as well. Was later taken to CT. Pt was transferred onto CT scanner for initial attempt of imaging. Was unwilling to cooperate with positioning. PA was informed of pts uncooperative. Pt spoke to daughter in attempts to calm pt down as she was tearful and yelling at staff. Daughter [name] and Pt informed of pending orders and plan to give medications to help pt calm down and allow for cooperation. Medications given - see MAR. Daughter updated that pt has now received imaging. Blood work obtained and pending at this time. Pt sleeping comfortably in bed. Call bell within reach.

## 2023-11-10 NOTE — ED Notes (Signed)
Pt talking to daughter [name] at this time

## 2023-11-10 NOTE — Discharge Instructions (Addendum)
Get help right away if: You have severe pain or a headache that is not relieved by medicine. You have unusual sleepiness, confusion, or personality changes. You vomit. You have a nosebleed that does not stop. You have double vision or blurred vision. You have clear fluid draining from your nose or ear, and it does not go away. You have trouble walking or using your arms or legs. You have severe dizziness.

## 2023-11-10 NOTE — ED Provider Notes (Signed)
[facility]MENT AT [facility] Provider Note   CSN: [id number] Arrival date & time: 11/10/23  2010     History  Chief Complaint  Patient presents with   Sarah Myers is a 52 y.o. female with a past medical history of intracerebral hemorrhage, spastic paraplegia and she is presenting to the emergency department after unwitnessed fall at her skilled nursing facility.  Staff thinks she was on the ground for 8 hours.  They were concerned she might have some injury to her head but were unsure.  She is on Eliquis.  Patient is able to talk but refuses to make eye contact answer questions except to state that she does not want to participate and does not want to answer questions   Fall       Home Medications Prior to Admission medications   Medication Sig Start Date End Date Taking? Authorizing Provider  acetaminophen (TYLENOL) 500 MG tablet Take 1,000 mg by mouth every 6 (six) hours as needed (pain).    [provider]  Amantadine HCl 100 MG tablet Take 100 mg by mouth 2 (two) times daily. 09/15/22   [provider]  amLODipine (NORVASC) 10 MG tablet Take 1 tablet (10 mg total) by mouth daily. 06/22/22   Raulkar, Drema Pry, MD  apixaban (ELIQUIS) 5 MG TABS tablet Take 1 tablet (5 mg total) by mouth 2 (two) times daily. 06/22/22   Raulkar, Drema Pry, MD  Baclofen 15 MG TABS Take 15 mg by mouth 3 (three) times daily.    [provider]  buPROPion (WELLBUTRIN) 75 MG tablet Take 75 mg by mouth 2 (two) times daily.    [provider]  carvedilol (COREG) 6.25 MG tablet Take 1 tablet (6.25 mg total) by mouth 2 (two) times daily with a meal. 06/22/22   Raulkar, Drema Pry, MD  cholecalciferol (VITAMIN D3) 25 MCG (1000 UNIT) tablet Take 1,000 Units by mouth daily.    [provider]  famotidine (PEPCID) 20 MG tablet Take 1 tablet (20 mg total) by mouth 2 (two) times daily. 05/22/22   Raulkar, Drema Pry, MD  gabapentin  (NEURONTIN) 100 MG capsule Take 1 capsule (100 mg total) by mouth 2 (two) times daily. 05/24/22   Raulkar, Drema Pry, MD  hydrALAZINE (APRESOLINE) 25 MG tablet Take 25 mg by mouth every 6 (six) hours. 10/10/22   [provider]  hydrOXYzine (VISTARIL) 50 MG capsule Take 50 mg by mouth at bedtime.    [provider]  losartan (COZAAR) 25 MG tablet Take 1 tablet (25 mg total) by mouth daily. 06/09/22   Raulkar, Drema Pry, MD  magnesium gluconate (MAGONATE) 500 MG tablet Take 1 tablet (500 mg total) by mouth at bedtime. 05/22/22   Raulkar, Drema Pry, MD  mirtazapine (REMERON) 7.5 MG tablet Take 7.5 mg by mouth at bedtime.    [provider]  Multiple Vitamin (MULTIVITAMIN WITH MINERALS) TABS tablet Take 1 tablet by mouth daily. 05/22/22   Raulkar, Drema Pry, MD  Nutritional Supplements (NUTRITIONAL DRINK PO) Take 1 Container by mouth See admin instructions. 2 entries on MAR: 1) House shake, twice daily at 1000 and 1400 2) House supplement, three times daily at 1000, 1600, and 2200    [provider]  Nutritional Supplements (NUTRITIONAL SUPPLEMENT PO) Take 1 each by mouth 2 (two) times daily. Frozen nutritional treat    [provider]  ondansetron (ZOFRAN) 4 MG tablet Take 1 tablet (4 mg  total) by mouth every 8 (eight) hours as needed for nausea or vomiting. 10/17/22   Tegeler, Canary Brim, MD  polyethylene glycol (MIRALAX / GLYCOLAX) 17 g packet Take 17 g by mouth daily. 10/17/23   Meredeth Ide, MD  saccharomyces boulardii (FLORASTOR) 250 MG capsule Take 250 mg by mouth daily.    [provider]      Allergies    Zestril [lisinopril]    Review of Systems   Review of Systems  Physical Exam Updated Vital Signs BP 126/78   Pulse 78   Temp 97.8 F (36.6 C) (Axillary)   Resp 13   SpO2 100%  Physical Exam Vitals and nursing note reviewed.  Constitutional:      General: She is not in acute distress.    Appearance: She is well-developed. She  is not diaphoretic.  HENT:     Head: Normocephalic and atraumatic.     Right Ear: External ear normal.     Left Ear: External ear normal.     Nose: Nose normal.     Mouth/Throat:     Mouth: Mucous membranes are moist.  Eyes:     General: No scleral icterus.    Conjunctiva/sclera: Conjunctivae normal.  Cardiovascular:     Rate and Rhythm: Normal rate and regular rhythm.     Heart sounds: Normal heart sounds. No murmur heard.    No friction rub. No gallop.  Pulmonary:     Effort: Pulmonary effort is normal. No respiratory distress.     Breath sounds: Normal breath sounds.  Abdominal:     General: Bowel sounds are normal. There is no distension.     Palpations: Abdomen is soft. There is no mass.     Tenderness: There is no abdominal tenderness. There is no guarding.  Musculoskeletal:     Cervical back: Normal range of motion.     Comments: Spastic hemiparesis on the right side. No obvious bruising, deformity abrasions or swelling noted to the head thorax abdomen hips or extremities.  Skin:    General: Skin is warm and dry.  Neurological:     Mental Status: She is alert and oriented to person, place, and time.  Psychiatric:        Behavior: Behavior normal.     ED Results / Procedures / Treatments   Labs (all labs ordered are listed, but only abnormal results are displayed) Labs Reviewed  COMPREHENSIVE METABOLIC PANEL - Abnormal; Notable for the following components:      Result Value   CO2 20 (*)    Calcium 10.5 (*)    Total Bilirubin 1.2 (*)    All other components within normal limits  CK - Abnormal; Notable for the following components:   Total CK 354 (*)    All other components within normal limits  CBC WITH DIFFERENTIAL/PLATELET    EKG EKG Interpretation Date/Time:  Saturday November 10 2023 20:16:04 EST Ventricular Rate:  101 PR Interval:  156 QRS Duration:  107 QT Interval:  353 QTC Calculation: 458 R Axis:   90  Text Interpretation: Sinus tachycardia  Biatrial enlargement Borderline right axis deviation Abnormal T, consider ischemia, diffuse leads Artifact in lead(s) I II III aVR aVL aVF V1 V2 V4 V5 V6 Confirmed by Kristine Royal [phone]) on 11/10/2023 8:54:49 PM  Radiology CT HEAD WO CONTRAST  Result Date: 11/10/2023 CLINICAL DATA:  Un witnessed fall, head and neck trauma EXAM: CT HEAD WITHOUT CONTRAST CT CERVICAL SPINE WITHOUT CONTRAST TECHNIQUE: Multidetector CT imaging  of the head and cervical spine was performed following the standard protocol without intravenous contrast. Multiplanar CT image reconstructions of the cervical spine were also generated. RADIATION DOSE REDUCTION: This exam was performed according to the departmental dose-optimization program which includes automated exposure control, adjustment of the mA and/or kV according to patient size and/or use of iterative reconstruction technique. COMPARISON:  11/08/2023 CT head, 04/25/2023 CT cervical spine FINDINGS: CT HEAD FINDINGS Brain: No evidence of acute infarct, hemorrhage, mass, mass effect, or midline shift. No hydrocephalus or extra-axial fluid collection. Periventricular white matter changes, likely the sequela of chronic small vessel ischemic disease. Age related cerebral atrophy. Remote left occipital infarct. Basal ganglia mineralization. Asymmetric atrophy in the right thalamus. Vascular: No hyperdense vessel. Skull: Negative for fracture or focal lesion. Right parietal scalp hematoma. Sinuses/Orbits: Redemonstrated likely mucocele in the ethmoid air cells. Otherwise clear paranasal sinuses. No acute finding in the orbits. Other: The mastoid air cells are well aerated. CT CERVICAL SPINE FINDINGS Alignment: No traumatic listhesis. Skull base and vertebrae: No acute fracture or suspicious osseous lesion. Soft tissues and spinal canal: No prevertebral fluid or swelling. No visible canal hematoma. Disc levels: Degenerative changes in the cervical spine.No high-grade spinal canal  stenosis. Upper chest: No focal pulmonary opacity or pleural effusion. IMPRESSION: 1. No acute intracranial process. Right parietal scalp hematoma. 2. No acute fracture or traumatic listhesis in the cervical spine. Electronically Signed   By: Wiliam Ke M.D.   On: 11/10/2023 23:08   CT CERVICAL SPINE WO CONTRAST  Result Date: 11/10/2023 CLINICAL DATA:  Un witnessed fall, head and neck trauma EXAM: CT HEAD WITHOUT CONTRAST CT CERVICAL SPINE WITHOUT CONTRAST TECHNIQUE: Multidetector CT imaging of the head and cervical spine was performed following the standard protocol without intravenous contrast. Multiplanar CT image reconstructions of the cervical spine were also generated. RADIATION DOSE REDUCTION: This exam was performed according to the departmental dose-optimization program which includes automated exposure control, adjustment of the mA and/or kV according to patient size and/or use of iterative reconstruction technique. COMPARISON:  11/08/2023 CT head, 04/25/2023 CT cervical spine FINDINGS: CT HEAD FINDINGS Brain: No evidence of acute infarct, hemorrhage, mass, mass effect, or midline shift. No hydrocephalus or extra-axial fluid collection. Periventricular white matter changes, likely the sequela of chronic small vessel ischemic disease. Age related cerebral atrophy. Remote left occipital infarct. Basal ganglia mineralization. Asymmetric atrophy in the right thalamus. Vascular: No hyperdense vessel. Skull: Negative for fracture or focal lesion. Right parietal scalp hematoma. Sinuses/Orbits: Redemonstrated likely mucocele in the ethmoid air cells. Otherwise clear paranasal sinuses. No acute finding in the orbits. Other: The mastoid air cells are well aerated. CT CERVICAL SPINE FINDINGS Alignment: No traumatic listhesis. Skull base and vertebrae: No acute fracture or suspicious osseous lesion. Soft tissues and spinal canal: No prevertebral fluid or swelling. No visible canal hematoma. Disc levels:  Degenerative changes in the cervical spine.No high-grade spinal canal stenosis. Upper chest: No focal pulmonary opacity or pleural effusion. IMPRESSION: 1. No acute intracranial process. Right parietal scalp hematoma. 2. No acute fracture or traumatic listhesis in the cervical spine. Electronically Signed   By: Wiliam Ke M.D.   On: 11/10/2023 23:08   DG Pelvis Portable  Result Date: 11/10/2023 CLINICAL DATA:  Trauma, fall. EXAM: PORTABLE PELVIS 1-2 VIEWS COMPARISON:  04/25/2023. FINDINGS: Examination is limited due to patient rotation. There is no evidence of pelvic fracture or diastasis. No pelvic bone lesions are seen. IMPRESSION: Limited due to patient positioning. No acute fracture or dislocation is  seen. Electronically Signed   By: Thornell Sartorius M.D.   On: 11/10/2023 22:49   DG Chest Port 1 View  Result Date: 11/10/2023 CLINICAL DATA:  Trauma, fall. EXAM: PORTABLE CHEST 1 VIEW COMPARISON:  10/13/2023. FINDINGS: Examination is limited due to patient's condition. The heart size and mediastinal contours are within normal limits. No consolidation, effusion, or pneumothorax. No acute osseous abnormality is seen. IMPRESSION: No active disease. Electronically Signed   By: Thornell Sartorius M.D.   On: 11/10/2023 22:48    Procedures Procedures    Medications Ordered in ED Medications  haloperidol lactate (HALDOL) injection 5 mg (5 mg Intramuscular Given 11/10/23 2133)  LORazepam (ATIVAN) injection 1 mg (1 mg Intramuscular Given 11/10/23 2133)    ED Course/ Medical Decision Making/ A&P Clinical Course as of 11/10/23 2342  Sat Nov 10, 2023  2306 DG Pelvis Portable [AH]  2306 DG Chest Port 1 View I visualized and interpreted portable pelvis and chest x-ray, no acute findings noted. [AH]  2337 CK(!) [AH]  2337 CK Total(!): 354 [AH]  2337 Comprehensive metabolic panel(!) [AH]  2337 CO2(!): 20 [AH]  2338 Creatinine: 0.61 [AH]  2338 Calcium(!): 10.5 [AH]  2338 CT HEAD WO CONTRAST [AH]  2338  CT CERVICAL SPINE WO CONTRAST I visualized and interpreted CT head and C-spine.  No acute findings.  Small scalp hematoma noted. [AH]    Clinical Course User Index [AH] Arthor Captain, PA-C                                 Medical Decision Making Amount and/or Complexity of Data Reviewed Labs: ordered. Radiology: ordered.  Risk Prescription drug management.   52 year old female with a history of previous CVA.  She was found on the floor.  Labs reassuring, she has a mildly elevated CK without evidence of rhabdomyolysis.  No evidence of significant dehydration.  I ordered and reviewed labs and imaging.  I visualized and interpreted these images as per the ED course.  No acute findings.  Patient may return to her skilled nursing facility.        Final Clinical Impression(s) / ED Diagnoses Final diagnoses:  Fall, initial encounter  Scalp hematoma, initial encounter    Rx / DC Orders ED Discharge Orders     None         Arthor Captain, PA-C 11/10/23 2342    Wynetta Fines, MD 11/11/23 1452

## 2023-11-10 NOTE — ED Notes (Signed)
Pt tearful. When asked what's wrong, pt sts "you know what's wrong." Pt will not elaborate as to what is hurting her

## 2023-11-22 ENCOUNTER — Encounter
Payer: Medicaid Other | Attending: Physical Medicine and Rehabilitation | Admitting: Physical Medicine and Rehabilitation

## 2023-11-22 VITALS — BP 139/97 | HR 78 | Ht 63.0 in

## 2023-11-22 DIAGNOSIS — R252 Cramp and spasm: Secondary | ICD-10-CM

## 2023-11-22 MED ORDER — ONABOTULINUMTOXINA 100 UNITS IJ SOLR
400.0000 [IU] | Freq: Once | INTRAMUSCULAR | Status: AC
  Administered 2023-11-22: 400 [IU] via INTRAMUSCULAR

## 2023-11-22 NOTE — Progress Notes (Signed)
Botox Injection for spasticity using US guidance   Indication: Severe spasticity which interferes with ADL,mobility and/or  hygiene and is unresponsive to medication management and other conservative care Informed consent was obtained after describing risks and benefits of the procedure with the patient. This includes bleeding, bruising, infection, excessive weakness, or medication side effects. A REMS form is on file and signed.  Number of units per muscle Biceps 150 FCR: 25 FCU: 25 FDS: 25 FDP: 25 Hamstrings: 150 All injections were done after obtaining appropriate ultrasound visualization and after negative drawback for blood. The patient tolerated the procedure well. Post procedure instructions were given. A followup appointment was made.   

## 2023-12-22 ENCOUNTER — Emergency Department (HOSPITAL_COMMUNITY): Payer: Medicaid Other

## 2023-12-22 ENCOUNTER — Encounter (HOSPITAL_COMMUNITY): Payer: Self-pay

## 2023-12-22 ENCOUNTER — Other Ambulatory Visit: Payer: Self-pay

## 2023-12-22 ENCOUNTER — Emergency Department (HOSPITAL_COMMUNITY)
Admission: EM | Admit: 2023-12-22 | Discharge: 2023-12-22 | Disposition: A | Discharge status: Home / Self Care | Payer: Medicaid Other | Attending: Emergency Medicine | Admitting: Emergency Medicine

## 2023-12-22 DIAGNOSIS — I10 Essential (primary) hypertension: Secondary | ICD-10-CM | POA: Diagnosis not present

## 2023-12-22 DIAGNOSIS — S0083XA Contusion of other part of head, initial encounter: Secondary | ICD-10-CM | POA: Insufficient documentation

## 2023-12-22 DIAGNOSIS — S0990XA Unspecified injury of head, initial encounter: Secondary | ICD-10-CM | POA: Diagnosis present

## 2023-12-22 DIAGNOSIS — S060X0A Concussion without loss of consciousness, initial encounter: Secondary | ICD-10-CM | POA: Diagnosis not present

## 2023-12-22 DIAGNOSIS — Z79899 Other long term (current) drug therapy: Secondary | ICD-10-CM | POA: Diagnosis not present

## 2023-12-22 DIAGNOSIS — W06XXXA Fall from bed, initial encounter: Secondary | ICD-10-CM | POA: Diagnosis not present

## 2023-12-22 DIAGNOSIS — M79605 Pain in left leg: Secondary | ICD-10-CM | POA: Insufficient documentation

## 2023-12-22 DIAGNOSIS — Z7901 Long term (current) use of anticoagulants: Secondary | ICD-10-CM | POA: Insufficient documentation

## 2023-12-22 NOTE — ED Triage Notes (Signed)
 Patient came from [facility]. Fall out of bed. Bed was close to ground with mats of the side. Hematoma to right forehead, staff at facility was able to control the bleeding. Patient complaining of right arm pain. Aox4  Hx of stroke with left side deficits Patient takes eliquis 

## 2023-12-22 NOTE — ED Notes (Signed)
 Patient transported to CT

## 2023-12-22 NOTE — ED Notes (Signed)
 [facility] called and notified regarding patient discharge. [facility] also called regarding patient discharge.

## 2023-12-22 NOTE — ED Notes (Signed)
 [phone] Sarah Myers pt daughter called for a update

## 2023-12-22 NOTE — ED Provider Notes (Signed)
 [place] [facility] AT [facility] Provider Note   CSN: [id number] Arrival date & time: 12/22/23  0003     History  Chief Complaint  Patient presents with   Sarah Myers is a 53 y.o. female.  The history is provided by the patient.  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  Patient reports she fell out of bed striking the right side of her head.  No LOC, but does have a headache.  Also has pain in her left leg. No other acute complaints    Past Medical History:  Diagnosis Date   Hypertension    Stroke [facility])     Home Medications Prior to Admission medications   Medication Sig Start Date End Date Taking? Authorizing Provider  acetaminophen  (TYLENOL ) 500 MG tablet Take 1,000 mg by mouth every 6 (six) hours as needed (pain).    [provider]  Amantadine  HCl 100 MG tablet Take 100 mg by mouth 2 (two) times daily. 09/15/22   [provider]  amLODipine  (NORVASC ) 10 MG tablet Take 1 tablet (10 mg total) by mouth daily. 06/22/22   Raulkar, Sven SQUIBB, MD  apixaban  (ELIQUIS ) 5 MG TABS tablet Take 1 tablet (5 mg total) by mouth 2 (two) times daily. 06/22/22   Raulkar, Sven SQUIBB, MD  Baclofen  15 MG TABS Take 15 mg by mouth 3 (three) times daily.    [provider]  buPROPion  (WELLBUTRIN ) 75 MG tablet Take 75 mg by mouth 2 (two) times daily.    [provider]  carvedilol  (COREG ) 6.25 MG tablet Take 1 tablet (6.25 mg total) by mouth 2 (two) times daily with a meal. 06/22/22   Raulkar, Sven SQUIBB, MD  cholecalciferol  (VITAMIN D3) 25 MCG (1000 UNIT) tablet Take 1,000 Units by mouth daily.    [provider]  famotidine  (PEPCID ) 20 MG tablet Take 1 tablet (20 mg total) by mouth 2 (two) times daily. 05/22/22   Raulkar, Sven SQUIBB, MD  gabapentin  (NEURONTIN ) 100 MG capsule Take 1 capsule (100 mg total) by mouth 2 (two) times daily. 05/24/22    Raulkar, Sven SQUIBB, MD  hydrALAZINE  (APRESOLINE ) 25 MG tablet Take 25 mg by mouth every 6 (six) hours. 10/10/22   [provider]  hydrOXYzine  (VISTARIL ) 50 MG capsule Take 50 mg by mouth at bedtime.    [provider]  losartan  (COZAAR ) 25 MG tablet Take 1 tablet (25 mg total) by mouth daily. 06/09/22   Raulkar, Sven SQUIBB, MD  magnesium  gluconate (MAGONATE) 500 MG tablet Take 1 tablet (500 mg total) by mouth at bedtime. 05/22/22   Raulkar, Sven SQUIBB, MD  mirtazapine  (REMERON ) 7.5 MG tablet Take 7.5 mg by mouth at bedtime.    [provider]  Multiple Vitamin (MULTIVITAMIN WITH MINERALS) TABS tablet Take 1 tablet by mouth daily. 05/22/22   Raulkar, Sven SQUIBB, MD  Nutritional Supplements (NUTRITIONAL DRINK PO) Take 1 Container by mouth See admin instructions. 2 entries on MAR: 1) House shake, twice daily at 1000 and 1400 2) House supplement, three times daily at 1000, 1600, and 2200    [provider]  Nutritional Supplements (NUTRITIONAL SUPPLEMENT PO) Take 1 each by mouth 2 (two) times daily. Frozen nutritional treat    [provider]  ondansetron  (ZOFRAN ) 4 MG tablet Take 1 tablet (4 mg total) by mouth every 8 (eight) hours as needed for nausea or vomiting. 10/17/22  Tegeler, Lonni PARAS, MD  polyethylene glycol (MIRALAX  / GLYCOLAX ) 17 g packet Take 17 g by mouth daily. 10/17/23   Drusilla Sabas RAMAN, MD  saccharomyces boulardii (FLORASTOR) 250 MG capsule Take 250 mg by mouth daily.    [provider]      Allergies    Zestril  [lisinopril ]    Review of Systems   Review of Systems  Constitutional:  Negative for fever.  Gastrointestinal:  Negative for vomiting.  Musculoskeletal:  Positive for arthralgias.  Neurological:  Positive for headaches.    Physical Exam Updated Vital Signs BP (!) 165/101 (BP Location: Right Arm)   Pulse 69   Temp (!) 97.4 F (36.3 C) (Oral)   Resp 14   Ht 1.651 m (5' 5)   Wt 43.1 kg   SpO2 100%   BMI 15.81  kg/m  Physical Exam CONSTITUTIONAL: Chronically ill-appearing, no acute distress HEAD: Hematoma noted to the right forehead, no active bleeding ENMT: Mucous membranes moist, no visible facial trauma NECK: supple no meningeal signs SPINE/BACK:entire spine nontender No bruising/crepitance/stepoffs noted to spine CV: S1/S2 noted LUNGS: Lungs are clear to auscultation bilaterally, no apparent distress Chest-no bruising or crepitus ABDOMEN: soft, nontender NEURO: Pt is awake/alert, chronic left-sided weakness noted EXTREMITIES: pulses normal/equal, full ROM Mild tenderness noted to the left knee. Chronic contracture left arm Pelvis stable All other extremities/joints palpated/ranged and nontender SKIN: warm, color normal  ED Results / Procedures / Treatments   Labs (all labs ordered are listed, but only abnormal results are displayed) Labs Reviewed - No data to display  EKG None  Radiology DG Pelvis Portable Result Date: 12/22/2023 CLINICAL DATA:  Recent fall with pelvic pain, initial encounter EXAM: PORTABLE PELVIS 1 VIEWS COMPARISON:  11/10/2023 FINDINGS: There is no evidence of pelvic fracture or diastasis. No pelvic bone lesions are seen. IMPRESSION: No acute abnormality noted. Electronically Signed   By: Oneil Devonshire M.D.   On: 12/22/2023 01:41   DG Knee Left Port Result Date: 12/22/2023 CLINICAL DATA:  Recent fall from bed with knee pain, initial encounter EXAM: PORTABLE LEFT KNEE - 2 VIEW COMPARISON:  None Available. FINDINGS: No evidence of fracture, dislocation, or joint effusion. No evidence of arthropathy or other focal bone abnormality. Soft tissues are unremarkable. IMPRESSION: No acute abnormality noted. Electronically Signed   By: Oneil Devonshire M.D.   On: 12/22/2023 01:40   CT HEAD WO CONTRAST Result Date: 12/22/2023 CLINICAL DATA:  Recent fall from bed with headaches and neck pain, initial encounter EXAM: CT HEAD WITHOUT CONTRAST CT CERVICAL SPINE WITHOUT CONTRAST  TECHNIQUE: Multidetector CT imaging of the head and cervical spine was performed following the standard protocol without intravenous contrast. Multiplanar CT image reconstructions of the cervical spine were also generated. RADIATION DOSE REDUCTION: This exam was performed according to the departmental dose-optimization program which includes automated exposure control, adjustment of the mA and/or kV according to patient size and/or use of iterative reconstruction technique. COMPARISON:  11/10/2023 FINDINGS: CT HEAD FINDINGS Brain: No evidence of acute infarction, hemorrhage, hydrocephalus, extra-axial collection or mass lesion/mass effect. Mild atrophic changes are noted. Left occipital infarct is seen stable from the prior exam. Vascular: No hyperdense vessel or unexpected calcification. Skull: Normal. Negative for fracture or focal lesion. Sinuses/Orbits: No acute finding. Other: Scalp hematoma is noted in the right frontal region near the vertex. CT CERVICAL SPINE FINDINGS Alignment: Loss of the normal cervical lordosis is noted. Skull base and vertebrae: 7 cervical segments are well visualized. Vertebral body height is well  maintained. The odontoid is within normal limits. Mild osteophytic changes are seen. No acute fracture or acute facet abnormality is noted. The odontoid is within normal limits. Soft tissues and spinal canal: Surrounding soft tissue structures are within normal limits. Upper chest: Visualized lung apices. Other: None IMPRESSION: CT of the head: No acute intracranial abnormality noted. Chronic changes as described Right frontal scalp hematoma consistent with the recent injury. CT of the cervical spine: Mild degenerative change without acute bony abnormality. Electronically Signed   By: Oneil Devonshire M.D.   On: 12/22/2023 01:10   CT CERVICAL SPINE WO CONTRAST Result Date: 12/22/2023 CLINICAL DATA:  Recent fall from bed with headaches and neck pain, initial encounter EXAM: CT HEAD WITHOUT  CONTRAST CT CERVICAL SPINE WITHOUT CONTRAST TECHNIQUE: Multidetector CT imaging of the head and cervical spine was performed following the standard protocol without intravenous contrast. Multiplanar CT image reconstructions of the cervical spine were also generated. RADIATION DOSE REDUCTION: This exam was performed according to the departmental dose-optimization program which includes automated exposure control, adjustment of the mA and/or kV according to patient size and/or use of iterative reconstruction technique. COMPARISON:  11/10/2023 FINDINGS: CT HEAD FINDINGS Brain: No evidence of acute infarction, hemorrhage, hydrocephalus, extra-axial collection or mass lesion/mass effect. Mild atrophic changes are noted. Left occipital infarct is seen stable from the prior exam. Vascular: No hyperdense vessel or unexpected calcification. Skull: Normal. Negative for fracture or focal lesion. Sinuses/Orbits: No acute finding. Other: Scalp hematoma is noted in the right frontal region near the vertex. CT CERVICAL SPINE FINDINGS Alignment: Loss of the normal cervical lordosis is noted. Skull base and vertebrae: 7 cervical segments are well visualized. Vertebral body height is well maintained. The odontoid is within normal limits. Mild osteophytic changes are seen. No acute fracture or acute facet abnormality is noted. The odontoid is within normal limits. Soft tissues and spinal canal: Surrounding soft tissue structures are within normal limits. Upper chest: Visualized lung apices. Other: None IMPRESSION: CT of the head: No acute intracranial abnormality noted. Chronic changes as described Right frontal scalp hematoma consistent with the recent injury. CT of the cervical spine: Mild degenerative change without acute bony abnormality. Electronically Signed   By: Oneil Devonshire M.D.   On: 12/22/2023 01:10    Procedures Procedures    Medications Ordered in ED Medications - No data to display  ED Course/ Medical Decision  Making/ A&P Clinical Course as of 12/22/23 0210  Sat Dec 22, 2023  0209 Patient presented from accidental fall from bed at nursing home. Patient is on anticoagulation.  No evidence of any head or neck trauma. Patient has chronic pain in her left arm due to contractures but no obvious deformity.  She had some pain in her left knee but no acute fractures.  Patient resting comfortably in no acute distress.  No other signs of acute traumatic injury.  I attempted to call nursing facility but no one was able to come to the phone.  I spoke to her daughter and gave her an update [DW]    Clinical Course User Index [DW] Midge Golas, MD                                 Medical Decision Making Amount and/or Complexity of Data Reviewed Radiology: ordered.   This patient presents to the ED for concern of fall with head trauma on anticoagulation, this involves an extensive number of treatment  options, and is a complaint that carries with it a high risk of complications and morbidity.  The differential diagnosis includes but is not limited to subdural hematoma, subarachnoid hemorrhage, skull fracture, concussion    Comorbidities that complicate the patient evaluation: Patient's presentation is complicated by their history of CVA  Social Determinants of Health: Patient's  poor mobility   increases the complexity of managing their presentation  Additional history obtained: Records reviewed previous admission documents  Imaging Studies ordered: I ordered imaging studies including CT scan head and C-spine   I independently visualized and interpreted imaging which showed no acute findings I agree with the radiologist interpretation  Reevaluation: After the interventions noted above, I reevaluated the patient and found that they have :improved  Complexity of problems addressed: Patient's presentation is most consistent with  acute presentation with potential threat to life or bodily  function  Disposition: After consideration of the diagnostic results and the patient's response to treatment,  I feel that the patent would benefit from discharge   .           Final Clinical Impression(s) / ED Diagnoses Final diagnoses:  Concussion without loss of consciousness, initial encounter    Rx / DC Orders ED Discharge Orders     None         Midge Golas, MD 12/22/23 0211

## 2023-12-22 NOTE — ED Notes (Signed)
 Patient returned from CT

## 2024-02-09 ENCOUNTER — Emergency Department (HOSPITAL_COMMUNITY): Payer: Medicaid Other

## 2024-02-09 ENCOUNTER — Encounter (HOSPITAL_COMMUNITY): Payer: Self-pay

## 2024-02-09 ENCOUNTER — Observation Stay (HOSPITAL_COMMUNITY)
Admission: EM | Admit: 2024-02-09 | Discharge: 2024-02-10 | Disposition: A | Discharge status: Other Acute Inpatient Hospital | Payer: Medicaid Other | Attending: Internal Medicine | Admitting: Internal Medicine

## 2024-02-09 ENCOUNTER — Other Ambulatory Visit: Payer: Self-pay

## 2024-02-09 DIAGNOSIS — W06XXXA Fall from bed, initial encounter: Secondary | ICD-10-CM | POA: Diagnosis not present

## 2024-02-09 DIAGNOSIS — Z8673 Personal history of transient ischemic attack (TIA), and cerebral infarction without residual deficits: Secondary | ICD-10-CM

## 2024-02-09 DIAGNOSIS — W19XXXA Unspecified fall, initial encounter: Principal | ICD-10-CM

## 2024-02-09 DIAGNOSIS — I10 Essential (primary) hypertension: Secondary | ICD-10-CM | POA: Insufficient documentation

## 2024-02-09 DIAGNOSIS — G9340 Encephalopathy, unspecified: Secondary | ICD-10-CM | POA: Diagnosis present

## 2024-02-09 DIAGNOSIS — Z7901 Long term (current) use of anticoagulants: Secondary | ICD-10-CM | POA: Insufficient documentation

## 2024-02-09 DIAGNOSIS — Z86711 Personal history of pulmonary embolism: Secondary | ICD-10-CM | POA: Diagnosis not present

## 2024-02-09 DIAGNOSIS — S0990XA Unspecified injury of head, initial encounter: Secondary | ICD-10-CM | POA: Insufficient documentation

## 2024-02-09 DIAGNOSIS — G9341 Metabolic encephalopathy: Principal | ICD-10-CM | POA: Insufficient documentation

## 2024-02-09 DIAGNOSIS — Z79899 Other long term (current) drug therapy: Secondary | ICD-10-CM | POA: Insufficient documentation

## 2024-02-09 DIAGNOSIS — R4182 Altered mental status, unspecified: Secondary | ICD-10-CM | POA: Diagnosis present

## 2024-02-09 DIAGNOSIS — F411 Generalized anxiety disorder: Secondary | ICD-10-CM | POA: Diagnosis present

## 2024-02-09 LAB — COMPREHENSIVE METABOLIC PANEL
ALT: 18 U/L (ref 0–44)
AST: 38 U/L (ref 15–41)
Albumin: 3.9 g/dL (ref 3.5–5.0)
Alkaline Phosphatase: 82 U/L (ref 38–126)
Anion gap: 11 (ref 5–15)
BUN: 16 mg/dL (ref 6–20)
CO2: 21 mmol/L — ABNORMAL LOW (ref 22–32)
Calcium: 11.9 mg/dL — ABNORMAL HIGH (ref 8.9–10.3)
Chloride: 108 mmol/L (ref 98–111)
Creatinine, Ser: 0.77 mg/dL (ref 0.44–1.00)
GFR, Estimated: >60 mL/min (ref >60)
Glucose, Bld: 77 mg/dL (ref 70–99)
Potassium: 4.8 mmol/L (ref 3.5–5.1)
Sodium: 140 mmol/L (ref 135–145)
Total Bilirubin: 1.2 mg/dL (ref 0.0–1.2)
Total Protein: 7.8 g/dL (ref 6.5–8.1)

## 2024-02-09 LAB — CBC
HCT: 43.3 % (ref 36.0–46.0)
Hemoglobin: 13.8 g/dL (ref 12.0–15.0)
MCH: 27.9 pg (ref 26.0–34.0)
MCHC: 31.9 g/dL (ref 30.0–36.0)
MCV: 87.7 fL (ref 80.0–100.0)
Platelets: 245 10*3/uL (ref 150–400)
RBC: 4.94 MIL/uL (ref 3.87–5.11)
RDW: 12.8 % (ref 11.5–15.5)
WBC: 6.7 10*3/uL (ref 4.0–10.5)
nRBC: 0 % (ref 0.0–0.2)

## 2024-02-09 LAB — I-STAT CHEM 8, ED
BUN: 23 mg/dL — ABNORMAL HIGH (ref 6–20)
Calcium, Ion: 1.28 mmol/L (ref 1.15–1.40)
Chloride: 113 mmol/L — ABNORMAL HIGH (ref 98–111)
Creatinine, Ser: 0.7 mg/dL (ref 0.44–1.00)
Glucose, Bld: 78 mg/dL (ref 70–99)
HCT: 44 % (ref 36.0–46.0)
Hemoglobin: 15 g/dL (ref 12.0–15.0)
Potassium: 4.3 mmol/L (ref 3.5–5.1)
Sodium: 142 mmol/L (ref 135–145)
TCO2: 24 mmol/L (ref 22–32)

## 2024-02-09 LAB — ETHANOL: Alcohol, Ethyl (B): <10 mg/dL (ref <10)

## 2024-02-09 LAB — I-STAT CG4 LACTIC ACID, ED: Lactic Acid, Venous: 1.5 mmol/L (ref 0.5–1.9)

## 2024-02-09 LAB — TSH: TSH: 0.896 u[IU]/mL (ref 0.350–4.500)

## 2024-02-09 LAB — AMMONIA: Ammonia: 31 umol/L (ref 9–35)

## 2024-02-09 MED ORDER — LOSARTAN POTASSIUM 50 MG PO TABS
25.0000 mg | ORAL_TABLET | Freq: Every day | ORAL | Status: DC
  Administered 2024-02-09: 25 mg via ORAL
  Filled 2024-02-09: qty 1

## 2024-02-09 MED ORDER — CARVEDILOL 3.125 MG PO TABS
6.2500 mg | ORAL_TABLET | Freq: Two times a day (BID) | ORAL | Status: DC
  Filled 2024-02-09: qty 2

## 2024-02-09 MED ORDER — POLYETHYLENE GLYCOL 3350 17 G PO PACK: 17.0000 g | PACK | Freq: Every day | ORAL | Status: DC | PRN

## 2024-02-09 MED ORDER — PROCHLORPERAZINE EDISYLATE 10 MG/2ML IJ SOLN: 5.0000 mg | Freq: Four times a day (QID) | INTRAMUSCULAR | Status: DC | PRN

## 2024-02-09 MED ORDER — ACETAMINOPHEN 325 MG PO TABS: 650.0000 mg | ORAL_TABLET | Freq: Four times a day (QID) | ORAL | Status: DC | PRN

## 2024-02-09 MED ORDER — DIVALPROEX SODIUM 125 MG PO DR TAB
125.0000 mg | DELAYED_RELEASE_TABLET | Freq: Every day | ORAL | Status: DC
  Administered 2024-02-09: 125 mg via ORAL
  Filled 2024-02-09 (×2): qty 1

## 2024-02-09 MED ORDER — ACETAMINOPHEN 650 MG RE SUPP: 650.0000 mg | Freq: Four times a day (QID) | RECTAL | Status: DC | PRN

## 2024-02-09 MED ORDER — MIRTAZAPINE 15 MG PO TABS
7.5000 mg | ORAL_TABLET | Freq: Every day | ORAL | Status: DC
Start: 2024-02-09 — End: 2024-02-10
  Administered 2024-02-09: 7.5 mg via ORAL
  Filled 2024-02-09: qty 1

## 2024-02-09 MED ORDER — AMLODIPINE BESYLATE 5 MG PO TABS
10.0000 mg | ORAL_TABLET | Freq: Every day | ORAL | Status: DC
  Administered 2024-02-09: 10 mg via ORAL
  Filled 2024-02-09: qty 2

## 2024-02-09 MED ORDER — POLYETHYLENE GLYCOL 3350 17 G PO PACK: 17.0000 g | PACK | Freq: Every day | ORAL | Status: DC

## 2024-02-09 MED ORDER — LORAZEPAM 2 MG/ML IJ SOLN
1.0000 mg | Freq: Once | INTRAMUSCULAR | Status: AC
  Administered 2024-02-09: 1 mg via INTRAMUSCULAR
  Filled 2024-02-09: qty 1

## 2024-02-09 MED ORDER — TIZANIDINE HCL 4 MG PO TABS: 2.0000 mg | ORAL_TABLET | Freq: Every day | ORAL | Status: DC

## 2024-02-09 MED ORDER — APIXABAN 5 MG PO TABS
5.0000 mg | ORAL_TABLET | Freq: Two times a day (BID) | ORAL | Status: DC
  Administered 2024-02-09: 5 mg via ORAL
  Filled 2024-02-09: qty 1

## 2024-02-09 MED ORDER — AMANTADINE HCL 100 MG PO CAPS
100.0000 mg | ORAL_CAPSULE | Freq: Two times a day (BID) | ORAL | Status: DC
  Administered 2024-02-09: 100 mg via ORAL
  Filled 2024-02-09 (×3): qty 1

## 2024-02-09 MED ORDER — HYDRALAZINE HCL 25 MG PO TABS
25.0000 mg | ORAL_TABLET | Freq: Four times a day (QID) | ORAL | Status: DC
  Administered 2024-02-09 – 2024-02-10 (×2): 25 mg via ORAL
  Filled 2024-02-09 (×2): qty 1

## 2024-02-09 MED ORDER — BUPROPION HCL 100 MG PO TABS
100.0000 mg | ORAL_TABLET | Freq: Three times a day (TID) | ORAL | Status: DC
  Administered 2024-02-09: 100 mg via ORAL
  Filled 2024-02-09 (×4): qty 1

## 2024-02-09 MED ORDER — GABAPENTIN 100 MG PO CAPS
100.0000 mg | ORAL_CAPSULE | Freq: Two times a day (BID) | ORAL | Status: DC
  Administered 2024-02-09: 100 mg via ORAL
  Filled 2024-02-09: qty 1

## 2024-02-09 MED ORDER — BACLOFEN 10 MG PO TABS
15.0000 mg | ORAL_TABLET | Freq: Three times a day (TID) | ORAL | Status: DC
  Administered 2024-02-09: 15 mg via ORAL
  Filled 2024-02-09: qty 2

## 2024-02-09 MED ORDER — SODIUM CHLORIDE 0.9 % IV SOLN: INTRAVENOUS | Status: AC

## 2024-02-09 MED ORDER — FAMOTIDINE 20 MG PO TABS
20.0000 mg | ORAL_TABLET | Freq: Two times a day (BID) | ORAL | Status: DC
  Administered 2024-02-09: 20 mg via ORAL
  Filled 2024-02-09: qty 1

## 2024-02-09 MED ORDER — LORAZEPAM 2 MG/ML IJ SOLN
2.0000 mg | Freq: Once | INTRAMUSCULAR | Status: AC
  Administered 2024-02-09: 2 mg via INTRAMUSCULAR
  Filled 2024-02-09: qty 1

## 2024-02-09 MED ORDER — HYDROXYZINE HCL 10 MG PO TABS
50.0000 mg | ORAL_TABLET | Freq: Every day | ORAL | Status: DC
  Administered 2024-02-09: 50 mg via ORAL
  Filled 2024-02-09: qty 5

## 2024-02-09 MED ORDER — LABETALOL HCL 5 MG/ML IV SOLN: 10.0000 mg | INTRAVENOUS | Status: DC | PRN

## 2024-02-09 MED ORDER — SODIUM CHLORIDE 0.9 % IV BOLUS
1000.0000 mL | Freq: Once | INTRAVENOUS | Status: AC
  Administered 2024-02-09: 1000 mL via INTRAVENOUS

## 2024-02-09 NOTE — ED Notes (Signed)
 Pt took meds at this time without issue. Pt also provided with snack of apple sauce and crackers.

## 2024-02-09 NOTE — ED Notes (Signed)
 X-ray at bedside

## 2024-02-09 NOTE — ED Notes (Signed)
 When attempting to start [place] IV on pt & staff assisting to help her hold still she attempted to bite this RN. No PIV, EDP aware.

## 2024-02-09 NOTE — H&P (Signed)
 History and Physical    Sarah Myers WJX:[id number] DOB: 1971-05-15 DOA: 02/09/2024  PCP: Shayne Alken, MD   Patient coming from: SNF   Chief Complaint: Larey Seat out of bed, not answering questions, crying, refusing some medications   HPI: Sarah Myers is a 53 y.o. 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  Patient has been refusing some of her medications for the past couple days, fell out of bed today, was noted to have a lump on her forehead, has been crying, and not answering questions as she normally does.   Patient appears to have frequent exacerbations in her cognitive deficits but at her baseline is able to answer basic questions and contribute to the history.   History is very difficult to obtain from the patient in the ED.  She told the ED physician that she "hurts all over."  She also reported cough.  ED Course: Upon arrival to the ED, patient is found to be afebrile and saturating well on room air with elevated blood pressure.  EKG demonstrates sinus rhythm with LPFB.  No acute findings are seen on chest x-ray or plain radiographs of the pelvis.  Head CT is negative for acute intracranial abnormality.  Labs are most notable for normal renal function, normal WBC, normal TSH, normal lactic acid, normal ammonia, and calcium 11.9.  Patient was treated with 3 mg of Ativan and 1 L normal saline in the ED.  Review of Systems:  All other systems reviewed and apart from HPI, are negative.  Past Medical History:  Diagnosis Date   Hypertension    Stroke [facility])     Past Surgical History:  Procedure Laterality Date   BREAST LUMPECTOMY  1992   CESAREAN SECTION     IR GASTROSTOMY TUBE MOD SED  03/06/2022   ORIF RADIAL FRACTURE  02/10/2012   Procedure: OPEN REDUCTION  INTERNAL FIXATION (ORIF) RADIAL FRACTURE;  Surgeon: Cammy Copa, MD;  Location: [facility] OR;  Service: Orthopedics;  Laterality: Right;   ORIF ULNAR FRACTURE  02/10/2012   Procedure: OPEN REDUCTION INTERNAL FIXATION (ORIF) ULNAR FRACTURE;  Surgeon: Cammy Copa, MD;  Location: [facility] OR;  Service: Orthopedics;  Laterality: Right;   TUBAL LIGATION      Social History:   reports that she has never smoked. She has never used smokeless tobacco. She reports that she does not currently use alcohol. She reports that she does not use drugs.  Allergies  Allergen Reactions   Zestril [Lisinopril] Cough    Family History  Problem Relation Age of Onset   Diabetes Mother    Hypertension Mother    Heart disease Mother    Diabetes Maternal Aunt    Stroke Maternal Uncle    Diabetes Maternal Grandmother    Heart disease Maternal Grandmother    Diabetes Maternal Grandfather    Heart disease Maternal Grandfather    Heart disease Paternal Grandmother    Heart disease Paternal Grandfather      Prior to Admission medications   Medication Sig Start Date End Date Taking? Authorizing Provider  acetaminophen (TYLENOL) 500 MG tablet Take 1,000 mg by mouth every 6 (six) hours as needed (pain).   Yes [provider]  Amantadine HCl 100 MG tablet Take 100 mg by mouth 2 (two) times daily. 09/15/22  Yes [provider]  amLODipine (NORVASC) 10 MG tablet Take 1 tablet (10 mg total) by mouth daily. 06/22/22  Yes Raulkar, Drema Pry, MD  apixaban (ELIQUIS) 5 MG TABS tablet Take 1 tablet (5 mg total) by mouth 2 (two) times daily. 06/22/22  Yes Raulkar, Drema Pry, MD  Baclofen 15 MG TABS Take 15 mg by mouth 3 (three) times daily.   Yes [provider]  buPROPion (WELLBUTRIN) 100 MG tablet Take 100 mg by mouth 3 (three) times daily.   Yes [provider]  carvedilol (COREG) 6.25 MG tablet Take 1 tablet (6.25 mg total) by mouth 2 (two) times daily with a meal. 06/22/22  Yes Raulkar,  Drema Pry, MD  cholecalciferol (VITAMIN D3) 25 MCG (1000 UNIT) tablet Take 1,000 Units by mouth daily.   Yes [provider]  divalproex (DEPAKOTE) 125 MG DR tablet Take 125 mg by mouth daily.   Yes [provider]  famotidine (PEPCID) 20 MG tablet Take 1 tablet (20 mg total) by mouth 2 (two) times daily. 05/22/22  Yes Raulkar, Drema Pry, MD  gabapentin (NEURONTIN) 100 MG capsule Take 1 capsule (100 mg total) by mouth 2 (two) times daily. 05/24/22  Yes Raulkar, Drema Pry, MD  hydrALAZINE (APRESOLINE) 25 MG tablet Take 25 mg by mouth every 6 (six) hours. 10/10/22  Yes [provider]  hydrOXYzine (VISTARIL) 50 MG capsule Take 50 mg by mouth at bedtime.   Yes [provider]  losartan (COZAAR) 25 MG tablet Take 1 tablet (25 mg total) by mouth daily. 06/09/22  Yes Raulkar, Drema Pry, MD  magnesium gluconate (MAGONATE) 500 MG tablet Take 1 tablet (500 mg total) by mouth at bedtime. 05/22/22  Yes Raulkar, Drema Pry, MD  mirtazapine (REMERON) 7.5 MG tablet Take 7.5 mg by mouth at bedtime.   Yes [provider]  Multiple Vitamin (MULTIVITAMIN WITH MINERALS) TABS tablet Take 1 tablet by mouth daily. 05/22/22  Yes Raulkar, Drema Pry, MD  Nutritional Supplements (NUTRITIONAL SUPPLEMENT PO) Take 1 each by mouth 2 (two) times daily. Frozen nutritional treat   Yes [provider]  ondansetron (ZOFRAN) 4 MG tablet Take 1 tablet (4 mg total) by mouth every 8 (eight) hours as needed for nausea or vomiting. 10/17/22  Yes Tegeler, Canary Brim, MD  polyethylene glycol (MIRALAX / GLYCOLAX) 17 g packet Take 17 g by mouth daily. 10/17/23  Yes Meredeth Ide, MD  saccharomyces boulardii (FLORASTOR) 250 MG capsule Take 250 mg by mouth daily.   Yes [provider]  tiZANidine (ZANAFLEX) 2 MG tablet Take 2 mg by mouth at bedtime.   Yes [provider]    Physical Exam: Vitals:   02/09/24 1645 02/09/24 1745 02/09/24 1845 02/09/24 2015  BP: (!) 151/103 (!)  149/96 (!) 133/96 (!) 147/95  Pulse: 83 74 71 68  Resp: 18 18 17 18   Temp:      TempSrc:      SpO2: 100% 100% 100% 100%  Weight:      Height:        Constitutional: NAD, no pallor or diaphoresis   Eyes: PERTLA, lids and conjunctivae normal ENMT: Mucous membranes are moist. Posterior pharynx clear of any exudate or lesions.   Neck: supple, no masses, no meningismus  Respiratory: no wheezing, no crackles. No accessory muscle use.  Cardiovascular: S1 & S2 heard, regular rate and rhythm. No extremity edema.  Abdomen: No distension, no tenderness, soft. Bowel sounds active.  Musculoskeletal: no clubbing / cyanosis. No joint deformity upper and lower  extremities.   Skin: no significant rashes, lesions, ulcers. Warm, dry, well-perfused. Neurologic: Hemiplegic. Moving right arm and leg. Sleeping. Wakes to voice and makes eye-contact, but only occasionally provides answer to basic questions.     Labs and Imaging on Admission: I have personally reviewed following labs and imaging studies  CBC: Recent Labs  Lab 02/09/24 1500 02/09/24 1505  WBC 6.7  --   HGB 13.8 15.0  HCT 43.3 44.0  MCV 87.7  --   PLT 245  --    Basic Metabolic Panel: Recent Labs  Lab 02/09/24 1500 02/09/24 1505  NA 140 142  K 4.8 4.3  CL 108 113*  CO2 21*  --   GLUCOSE 77 78  BUN 16 23*  CREATININE 0.77 0.70  CALCIUM 11.9*  --    GFR: Estimated Creatinine Clearance: 59.6 mL/min (by C-G formula based on SCr of 0.7 mg/dL). Liver Function Tests: Recent Labs  Lab 02/09/24 1500  AST 38  ALT 18  ALKPHOS 82  BILITOT 1.2  PROT 7.8  ALBUMIN 3.9   No results for input(s): "LIPASE", "AMYLASE" in the last 168 hours. Recent Labs  Lab 02/09/24 1635  AMMONIA 31   Coagulation Profile: No results for input(s): "INR", "PROTIME" in the last 168 hours. Cardiac Enzymes: No results for input(s): "CKTOTAL", "CKMB", "CKMBINDEX", "TROPONINI" in the last 168 hours. BNP (last 3 results) No results for input(s):  "PROBNP" in the last 8760 hours. HbA1C: No results for input(s): "HGBA1C" in the last 72 hours. CBG: No results for input(s): "GLUCAP" in the last 168 hours. Lipid Profile: No results for input(s): "CHOL", "HDL", "LDLCALC", "TRIG", "CHOLHDL", "LDLDIRECT" in the last 72 hours. Thyroid Function Tests: Recent Labs    02/09/24 1500  TSH 0.896   Anemia Panel: No results for input(s): "VITAMINB12", "FOLATE", "FERRITIN", "TIBC", "IRON", "RETICCTPCT" in the last 72 hours. Urine analysis:    Component Value Date/Time   COLORURINE YELLOW 11/08/2023 2219   APPEARANCEUR CLEAR 11/08/2023 2219   LABSPEC 1.016 11/08/2023 2219   PHURINE 5.0 11/08/2023 2219   GLUCOSEU NEGATIVE 11/08/2023 2219   HGBUR NEGATIVE 11/08/2023 2219   BILIRUBINUR NEGATIVE 11/08/2023 2219   KETONESUR 20 (A) 11/08/2023 2219   PROTEINUR NEGATIVE 11/08/2023 2219   UROBILINOGEN 1.0 09/20/2015 1448   NITRITE NEGATIVE 11/08/2023 2219   LEUKOCYTESUR NEGATIVE 11/08/2023 2219   Sepsis Labs: @LABRCNTIP (procalcitonin:4,lacticidven:4) )No results found for this or any previous visit (from the past 240 hours).   Radiological Exams on Admission: CT HEAD WO CONTRAST Result Date: 02/09/2024 CLINICAL DATA:  Moderate to severe head trauma.  Fall on thinners. EXAM: CT HEAD WITHOUT CONTRAST TECHNIQUE: Contiguous axial images were obtained from the base of the skull through the vertex without intravenous contrast. RADIATION DOSE REDUCTION: This exam was performed according to the departmental dose-optimization program which includes automated exposure control, adjustment of the mA and/or kV according to patient size and/or use of iterative reconstruction technique. COMPARISON:  CT brain 12/22/2023 FINDINGS: The patient is oblique in the scanner, which limits this examination. The technologist reports the patient was unable to lie on her back and unable to follow instructions. There is also mild motion artifact. Brain: There is mild cortical  atrophy, within normal limits for patient age. There is again ex vacuo dilatation of the occipital horn of left lateral ventricle from old left occipital infarct and resultant encephalomalacia. The basilar cisterns are patent. No mass, mass effect, or midline shift. No acute intracranial hemorrhage is seen. No abnormal extra-axial fluid collection. Mild  periventricular patchy white matter hypodensities are similar to prior, nonspecific but most likely secondary to chronic ischemic white matter changes. Preservation of the normal cortical gray-white interface without CT evidence of an acute major vascular territorial cortical based infarction. Vascular: No hyperdense vessel or unexpected calcification. Skull: Normal. Negative for fracture or focal lesion. Sinuses/Orbits: The visualized orbits are unremarkable. 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 Other: None. IMPRESSION: 1. No acute intracranial abnormality is seen. 2. 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 3. Mild cortical atrophy, within normal limits for patient age. Electronically Signed   By: Neita Garnet M.D.   On: 02/09/2024 14:45   DG Chest Port 1 View Result Date: 02/09/2024 CLINICAL DATA:  Blunt trauma. EXAM: PORTABLE CHEST 1 VIEW COMPARISON:  11/10/2023 FINDINGS: The heart size and mediastinal contours are within normal limits. Both lungs are clear. No pneumothorax or hemothorax visualized. The visualized skeletal structures are unremarkable. IMPRESSION: Normal exam. Electronically Signed   By: Danae Orleans M.D.   On: 02/09/2024 12:51   DG Pelvis Portable Result Date: 02/09/2024 CLINICAL DATA:  Blunt trauma.  Pelvic pain. EXAM: PORTABLE PELVIS 1-2 VIEWS COMPARISON:  None Available. FINDINGS: Technically suboptimal exam due to nonstandard positioning. The iliac crests are not included within the field of view on this exam. No fracture seen involving the visualized portion of the pelvis.  IMPRESSION: Technically suboptimal exam. No fracture seen involving the visualized portion of the pelvis. Electronically Signed   By: Danae Orleans M.D.   On: 02/09/2024 12:50    EKG: Independently reviewed. Sinus rhythm, LPFB.   Assessment/Plan  1. Acute encephalopathy  - Has had similar presentations in setting of constipation/fecal impaction and infections (COVID, UTI, bacteremia)  - No acute findings noted on head CT; ammonia and TSH are normal in ED  - Use delirium precautions, correct electrolytes, check COVID/Flu/RSV, B12, and RPR   2. Hypercalcemia  - Calcium is 11.9 on admission; elevated previously in setting of dehydration  - Check PTH, continue IVF hydration, repeat chem panel in am    3. Hx of PE  - Eliquis    4. Depression; anxiety  - Continue Wellbutrin, hydroxyzine, Remeron, Depakote   5. Hypertension  - Coreg, hydralazine, Norvasc, losartan    6. Hx of ICH; cognitive impairment; spasticity  - Delirium precautions, baclofen, supportive care    DVT prophylaxis: Eliqius  Code Status: Full  Level of Care: Level of care: Med-Surg Family Communication: Daughter updated from ED  Disposition Plan:  Patient is from: SNF  Anticipated d/c is to: SNF  Anticipated d/c date is: 2/23 or 02/11/24  Patient currently: Pending improved mental status  Consults called: None  Admission status: Observation     Briscoe Deutscher, MD [facility]  02/09/2024, 9:01 PM

## 2024-02-09 NOTE — ED Provider Notes (Signed)
 [place] [facility]ENC[facility] DEPARTMENT AT [facility] Provider Note   CSN: [id number] Arrival date & time: 02/09/24  1116     History  No chief complaint on file.   Sarah Myers is a 53 y.o. female.  The history is provided by medical records. The history is limited by the condition of the patient.  Fall This is a new problem. Episode onset: this morning. The problem has not changed since onset.Nothing aggravates the symptoms. Nothing relieves the symptoms. She has tried nothing for the symptoms. The treatment provided no relief.   LVL 5 caveat for nonverbal and head injury     Home Medications Prior to Admission medications   Medication Sig Start Date End Date Taking? Authorizing Provider  acetaminophen (TYLENOL) 500 MG tablet Take 1,000 mg by mouth every 6 (six) hours as needed (pain).    [provider]  Amantadine HCl 100 MG tablet Take 100 mg by mouth 2 (two) times daily. 09/15/22   [provider]  amLODipine (NORVASC) 10 MG tablet Take 1 tablet (10 mg total) by mouth daily. 06/22/22   Raulkar, Drema Pry, MD  apixaban (ELIQUIS) 5 MG TABS tablet Take 1 tablet (5 mg total) by mouth 2 (two) times daily. 06/22/22   Raulkar, Drema Pry, MD  Baclofen 15 MG TABS Take 15 mg by mouth 3 (three) times daily.    [provider]  buPROPion (WELLBUTRIN) 75 MG tablet Take 75 mg by mouth 2 (two) times daily.    [provider]  carvedilol (COREG) 6.25 MG tablet Take 1 tablet (6.25 mg total) by mouth 2 (two) times daily with a meal. 06/22/22   Raulkar, Drema Pry, MD  cholecalciferol (VITAMIN D3) 25 MCG (1000 UNIT) tablet Take 1,000 Units by mouth daily.    [provider]  famotidine (PEPCID) 20 MG tablet Take 1 tablet (20 mg total) by mouth 2 (two) times daily. 05/22/22   Raulkar, Drema Pry, MD  gabapentin (NEURONTIN) 100 MG capsule Take 1 capsule (100 mg total) by mouth 2 (two) times daily. 05/24/22   Raulkar, Drema Pry, MD  hydrALAZINE  (APRESOLINE) 25 MG tablet Take 25 mg by mouth every 6 (six) hours. 10/10/22   [provider]  hydrOXYzine (VISTARIL) 50 MG capsule Take 50 mg by mouth at bedtime.    [provider]  losartan (COZAAR) 25 MG tablet Take 1 tablet (25 mg total) by mouth daily. 06/09/22   Raulkar, Drema Pry, MD  magnesium gluconate (MAGONATE) 500 MG tablet Take 1 tablet (500 mg total) by mouth at bedtime. 05/22/22   Raulkar, Drema Pry, MD  mirtazapine (REMERON) 7.5 MG tablet Take 7.5 mg by mouth at bedtime.    [provider]  Multiple Vitamin (MULTIVITAMIN WITH MINERALS) TABS tablet Take 1 tablet by mouth daily. 05/22/22   Raulkar, Drema Pry, MD  Nutritional Supplements (NUTRITIONAL DRINK PO) Take 1 Container by mouth See admin instructions. 2 entries on MAR: 1) House shake, twice daily at 1000 and 1400 2) House supplement, three times daily at 1000, 1600, and 2200    [provider]  Nutritional Supplements (NUTRITIONAL SUPPLEMENT PO) Take 1 each by mouth 2 (two) times daily. Frozen nutritional treat    [provider]  ondansetron (ZOFRAN) 4 MG tablet Take 1 tablet (4 mg total) by mouth every 8 (eight) hours as needed for nausea or vomiting. 10/17/22   Tazia Illescas, Canary Brim, MD  polyethylene glycol (MIRALAX / GLYCOLAX) 17 g packet Take 17 g by  mouth daily. 10/17/23   Meredeth Ide, MD  saccharomyces boulardii (FLORASTOR) 250 MG capsule Take 250 mg by mouth daily.    [provider]      Allergies    Zestril [lisinopril]    Review of Systems   Review of Systems  Unable to perform ROS: Patient nonverbal    Physical Exam Updated Vital Signs BP (!) 172/112 (BP Location: Right Arm)   Pulse 87   Temp (!) 96.3 F (35.7 C) (Oral)   Resp 16   SpO2 100%  Physical Exam Vitals and nursing note reviewed.  Constitutional:      General: She is not in acute distress.    Appearance: She is well-developed. She is not ill-appearing, toxic-appearing or diaphoretic.   HENT:     Head:      Nose: No congestion or rhinorrhea.  Eyes:     Conjunctiva/sclera: Conjunctivae normal.     Pupils: Pupils are equal, round, and reactive to light.  Cardiovascular:     Rate and Rhythm: Normal rate and regular rhythm.     Heart sounds: No murmur heard. Pulmonary:     Effort: Pulmonary effort is normal. No respiratory distress.     Breath sounds: Normal breath sounds. No wheezing or rhonchi.  Abdominal:     Palpations: Abdomen is soft.     Tenderness: There is no abdominal tenderness.  Musculoskeletal:        General: Tenderness present. No swelling.     Cervical back: Neck supple. No tenderness.  Skin:    General: Skin is warm and dry.     Capillary Refill: Capillary refill takes less than 2 seconds.     Findings: No erythema or rash.  Neurological:     Mental Status: She is alert.     Motor: Weakness and abnormal muscle tone present.     Comments: Not moving left arm and left leg but was able to move right arm and right leg to painful stimuli.  Not following any commands or answer questions.  Nonverbal at this time.     ED Results / Procedures / Treatments   Labs (all labs ordered are listed, but only abnormal results are displayed) Labs Reviewed  COMPREHENSIVE METABOLIC PANEL  CBC  ETHANOL  URINALYSIS, ROUTINE W REFLEX MICROSCOPIC  PROTIME-INR  TSH  AMMONIA  I-STAT CHEM 8, ED  I-STAT CG4 LACTIC ACID, ED  SAMPLE TO BLOOD BANK    EKG None  Radiology CT HEAD WO CONTRAST Result Date: 02/09/2024 CLINICAL DATA:  Moderate to severe head trauma.  Fall on thinners. EXAM: CT HEAD WITHOUT CONTRAST TECHNIQUE: Contiguous axial images were obtained from the base of the skull through the vertex without intravenous contrast. RADIATION DOSE REDUCTION: This exam was performed according to the departmental dose-optimization program which includes automated exposure control, adjustment of the mA and/or kV according to patient size and/or use of iterative  reconstruction technique. COMPARISON:  CT brain 12/22/2023 FINDINGS: The patient is oblique in the scanner, which limits this examination. The technologist reports the patient was unable to lie on her back and unable to follow instructions. There is also mild motion artifact. Brain: There is mild cortical atrophy, within normal limits for patient age. There is again ex vacuo dilatation of the occipital horn of left lateral ventricle from old left occipital infarct and resultant encephalomalacia. The basilar cisterns are patent. No mass, mass effect, or midline shift. No acute intracranial hemorrhage is seen. No abnormal extra-axial fluid collection. Mild periventricular  patchy white matter hypodensities are similar to prior, nonspecific but most likely secondary to chronic ischemic white matter changes. Preservation of the normal cortical gray-white interface without CT evidence of an acute major vascular territorial cortical based infarction. Vascular: No hyperdense vessel or unexpected calcification. Skull: Normal. Negative for fracture or focal lesion. Sinuses/Orbits: The visualized orbits are unremarkable. 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 Other: None. IMPRESSION: 1. No acute intracranial abnormality is seen. 2. 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 3. Mild cortical atrophy, within normal limits for patient age. Electronically Signed   By: Neita Garnet M.D.   On: 02/09/2024 14:45   DG Chest Port 1 View Result Date: 02/09/2024 CLINICAL DATA:  Blunt trauma. EXAM: PORTABLE CHEST 1 VIEW COMPARISON:  11/10/2023 FINDINGS: The heart size and mediastinal contours are within normal limits. Both lungs are clear. No pneumothorax or hemothorax visualized. The visualized skeletal structures are unremarkable. IMPRESSION: Normal exam. Electronically Signed   By: Danae Orleans M.D.   On: 02/09/2024 12:51   DG Pelvis Portable Result Date: 02/09/2024 CLINICAL DATA:   Blunt trauma.  Pelvic pain. EXAM: PORTABLE PELVIS 1-2 VIEWS COMPARISON:  None Available. FINDINGS: Technically suboptimal exam due to nonstandard positioning. The iliac crests are not included within the field of view on this exam. No fracture seen involving the visualized portion of the pelvis. IMPRESSION: Technically suboptimal exam. No fracture seen involving the visualized portion of the pelvis. Electronically Signed   By: Danae Orleans M.D.   On: 02/09/2024 12:50    Procedures Procedures    Medications Ordered in ED Medications  LORazepam (ATIVAN) injection 1 mg (1 mg Intramuscular Given 02/09/24 1300)  LORazepam (ATIVAN) injection 2 mg (2 mg Intramuscular Given 02/09/24 1332)    ED Course/ Medical Decision Making/ A&P                                 Medical Decision Making Amount and/or Complexity of Data Reviewed Labs: ordered. Radiology: ordered.  Risk Prescription drug management.    Sarah Myers is a 53 y.o. female with a past medical history significant previous stroke with left-sided deficits per the chart, hypertension, previous pulmonary embolism on anticoagulation with Eliquis, and previous intracerebral hemorrhage who presents with altered mental status and a fall.  According to EMS report, patient fell out of bed and had a bump on her right forehead and was crying and mumbling and not answering questions.  She reportedly is normally able to answer some questions but was not doing so today.  Per EMS, she does have behavioral health history and has had this happen in the past.  She reportedly has been refusing take her meds for the last few days.  On my evaluation, breath sounds are equal bilaterally.  Chest is nontender.  Abdomen is tender.  She is not answering any questions and was only slightly moving her right arm and right leg to stimuli.  Was not moving the left side.  She is not answering any questions.  Pupils are symmetric and reactive.  She has a bump on  her right forehead concerning for trauma.  As she is unable to answer other questions, has evidence of head trauma on exam, is now not talking, and is on blood thinners, will activate as a level 2 trauma for altered mental status and evidence of head trauma although she is under 65.  As she is not able  answer questions will do pan scans to look for other traumatic injuries.  Patient's blood pressure was not low and portable chest x-ray and pelvis x-ray which I assisted in image collection at the bedside did not reveal large pneumothorax or large hip fracture.  She will get pan scans including the head imaging to look for traumatic injury.  Given his reported altered mental status from baseline, anticipate she will need admission after workup is further along.  With his history of stroke and speech change, she may also need MRI.  1:16 PM Patient now more awake and answering questions somewhat.  She says that she "hurts all over".  She will not tell me what happened and is crying.  She reportedly did not let nursing place an IV and was jerking constantly.  Appeared unsafe for IV placement.  Given my low suspicion for intra-abdominal or thoracic problem, will switch imaging to noncontrasted CT imaging of her torso.  Will still get the imaging of her head and neck as well.  Anticipate reassessment after imaging.  1:48 PM Patient unable to lay still despite IM Ativan x 2.  I was able to convince patient to sit still to get a CT of the head given the suspected head injury however she is now saying she is not hurting in her chest/ab/pelvis or neck so we will cancel the CTs from the neck down.  She was able to get the head CT.  Will go reassess patient after CT is completed to determine if she needs further workup or further evaluation today.  2:37 PM Ativan seems to have started working on the patient now she is somnolent and not answering questions.  As she is more relaxed, will get the blood work.   She will need reassessment after metabolizing the Ativan and lab testing is completed to determine disposition if she needs psychiatric evaluation versus MRI versus discharge back to her facility.  3:03 PM Nursing was able to get some lab work.  Care transferred oncoming team to wait for lab assessment and reassessment to determine further management.        Final Clinical Impression(s) / ED Diagnoses Final diagnoses:  Fall, initial encounter  Injury of head, initial encounter  Altered mental status, unspecified altered mental status type     Clinical Impression: 1. Fall, initial encounter   2. Injury of head, initial encounter   3. Altered mental status, unspecified altered mental status type     Disposition: Care transferred to oncoming team to await lab results and reassessment.  This note was prepared with assistance of Conservation officer, historic buildings. Occasional wrong-word or sound-a-like substitutions may have occurred due to the inherent limitations of voice recognition software.      Sarah Myers, Canary Brim, MD 02/09/24 [phone]

## 2024-02-09 NOTE — Progress Notes (Signed)
   02/09/24 1225  Spiritual Encounters  Type of Visit Initial  Care provided to: Pt not available  Conversation partners present during encounter Nurse  Referral source Trauma page  Reason for visit Trauma  OnCall Visit Yes   Chaplain responded to a trauma in the ED - level II, fall on blood thinners. No family present at this time. Per RN, no needs at this time. Spiritual care services available as needed.   Alda Ponder, Chaplain 02/09/24

## 2024-02-09 NOTE — ED Triage Notes (Signed)
 Pt BIB [facility] from [facility] d/t pt refusing the to take her meds the last few days & BP meds being one of them & is now hypertensive at 198/120. Staff reports to EMS that she does have Behavioral Hx where she does this often & will also throw herself OOB & she has hallucinations & reaches out for things not there. Staff states she can talk but has not been speaking as well, EMS reports she was mumbling while en route & began crying when arrived to ED. Does have Rt sided def from past Stroke, is on Eliquis, was found laying in her floor on a floor mat beside her bed with her head against the nightstand by staff in her room today. All other VSS, no PIV.

## 2024-02-09 NOTE — Progress Notes (Signed)
 Orthopedic Tech Progress Note Patient Details:  Sarah Myers Nov 26, 1971 [id number] Level 2 Trauma  Patient ID: [name], female   DOB: 1971-04-12, 53 y.o.   MRN: [id number]  Smitty Pluck 02/09/2024, 3:40 PM

## 2024-02-09 NOTE — ED Notes (Signed)
 Pt sleeping at time of RN assessment. Breathing unlabored. Vital signs stable.

## 2024-02-09 NOTE — ED Notes (Signed)
 Pt smacking herself in the face, crying & speaking clearly at this time, EDP informed.

## 2024-02-09 NOTE — ED Notes (Signed)
 Patient transported to CT

## 2024-02-09 NOTE — ED Notes (Addendum)
 EDP at bedside & pt speaking to him.

## 2024-02-09 NOTE — ED Provider Notes (Signed)
 Care transferred to me. Patient remains altered. She will wake up but not participate much in history and also seems to not be paying much attention.  Is unclear if this is psychiatric versus generally altered for some other reason.  She has noted to be hypercalcemic to 11.9 which could induce some altered mental status and/or psychiatric problems.  Will start her on normal saline.  CT head is unremarkable.  She is afebrile.  Other labs are pending but I think she will need admission.  Discussed with Dr. Antionette Char, who will admit.   Pricilla Loveless, MD 02/09/24 [phone]

## 2024-02-10 DIAGNOSIS — G9340 Encephalopathy, unspecified: Secondary | ICD-10-CM | POA: Diagnosis not present

## 2024-02-10 LAB — CBC
HCT: 35.8 % — ABNORMAL LOW (ref 36.0–46.0)
Hemoglobin: 11.4 g/dL — ABNORMAL LOW (ref 12.0–15.0)
MCH: 27.5 pg (ref 26.0–34.0)
MCHC: 31.8 g/dL (ref 30.0–36.0)
MCV: 86.3 fL (ref 80.0–100.0)
Platelets: 169 10*3/uL (ref 150–400)
RBC: 4.15 MIL/uL (ref 3.87–5.11)
RDW: 12.9 % (ref 11.5–15.5)
WBC: 4.6 10*3/uL (ref 4.0–10.5)
nRBC: 0 % (ref 0.0–0.2)

## 2024-02-10 LAB — BASIC METABOLIC PANEL
Anion gap: 12 (ref 5–15)
BUN: 14 mg/dL (ref 6–20)
CO2: 21 mmol/L — ABNORMAL LOW (ref 22–32)
Calcium: 10.5 mg/dL — ABNORMAL HIGH (ref 8.9–10.3)
Chloride: 108 mmol/L (ref 98–111)
Creatinine, Ser: 0.76 mg/dL (ref 0.44–1.00)
GFR, Estimated: >60 mL/min (ref >60)
Glucose, Bld: 57 mg/dL — ABNORMAL LOW (ref 70–99)
Potassium: 3.7 mmol/L (ref 3.5–5.1)
Sodium: 141 mmol/L (ref 135–145)

## 2024-02-10 LAB — CBG MONITORING, ED: Glucose-Capillary: 104 mg/dL — ABNORMAL HIGH (ref 70–99)

## 2024-02-10 LAB — PROTIME-INR
INR: 1.3 — ABNORMAL HIGH (ref 0.8–1.2)
Prothrombin Time: 16.7 s — ABNORMAL HIGH (ref 11.4–15.2)

## 2024-02-10 LAB — ETHANOL: Alcohol, Ethyl (B): <10 mg/dL (ref <10)

## 2024-02-10 LAB — PHOSPHORUS: Phosphorus: 3.1 mg/dL (ref 2.5–4.6)

## 2024-02-10 LAB — RPR: RPR Ser Ql: NONREACTIVE

## 2024-02-10 LAB — VITAMIN B12: Vitamin B-12: 208 pg/mL (ref 180–914)

## 2024-02-10 LAB — AMMONIA: Ammonia: 43 umol/L — ABNORMAL HIGH (ref 9–35)

## 2024-02-10 LAB — MAGNESIUM: Magnesium: 1.7 mg/dL (ref 1.7–2.4)

## 2024-02-10 MED ORDER — DEXTROSE 50 % IV SOLN
1.0000 | Freq: Once | INTRAVENOUS | Status: AC
  Administered 2024-02-10: 50 mL via INTRAVENOUS
  Filled 2024-02-10: qty 50

## 2024-02-10 NOTE — ED Notes (Signed)
 Spoke with Dre at [facility] who states he will notify his supervisor and they will return the call.

## 2024-02-10 NOTE — ED Notes (Signed)
 Spoke with Hilda Lias from [facility], report given, pt waiting [facility] transport at this time.

## 2024-02-10 NOTE — Discharge Summary (Signed)
 Physician Discharge Summary   Patient: Sarah Myers MRN: [id number] DOB: 1971-07-21  Admit date:     02/09/2024  Discharge date: 02/10/24  Discharge Physician: Deanna Artis   PCP: Shayne Alken, MD   Recommendations at discharge:   At this time patient will be discharged home.  If you experience any symptoms such as fever, vomiting, shortness of breath, chest pain, abdominal pain, or other concerning symptoms, please call your primary care provider or go to the emergency department immediately.  Discharge Diagnoses: Principal Problem:   Acute encephalopathy Active Problems:   Hypercalcemia   GAD (generalized anxiety disorder)   History of stroke   History of pulmonary embolism  Resolved Problems:   * No resolved hospital problems. *  Hospital Course: HPI: Sarah Myers is a 53 y.o. 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   Patient has been refusing some of her medications for the past couple days, fell out of bed today, was noted to have a lump on her forehead, has been crying, and not answering questions as she normally does.    Patient appears to have frequent exacerbations in her cognitive deficits but at her baseline is able to answer basic questions and contribute to the history.    History is very difficult to obtain from the patient in the ED.  She told the ED physician that she "hurts all over."  She also reported cough.   ED Course: Upon arrival to the ED, patient is found to be afebrile and saturating well on room air with elevated blood pressure.  EKG demonstrates sinus rhythm with LPFB.  No acute findings are seen on chest x-ray or plain radiographs of the pelvis.  Head CT is negative for acute intracranial abnormality.  Labs are most notable for  normal renal function, normal WBC, normal TSH, normal lactic acid, normal ammonia, and calcium 11.9.   Patient was treated with 3 mg of Ativan and 1 L normal saline in the ED.  Assessment and Plan:  Acute metabolic encephalopathy - Likely secondary to dehydration.  Viral panel negative.  No infectious etiology found, UA negative, head CT negative.  Chest x-ray clear.  After IV fluid hydration supplementation, by next morning patient was alert, talkative, refusing lab draws, eager to eat [employer] and be discharged from the hospital.  Peers to be back at her baseline  Hypercalcemia - Mildly elevated on presentation.  Likely exacerbated by dehydration.  Showed improvement after IV fluid hydration.  Encourage continued oral hydration upon discharge.  If hypercalcemia persistent, may warrant workup in the outpatient setting.  History of PE - Continue Eliquis.  Depression/anxiety - Continue home medication regimen.  History of intracranial hemorrhage, cognitive impairment, spasticity - Continue supportive care and previous medication regiment.  Goals of care - Updated family.  Patient appears improved.  Will discharge back to [facility] facility.      Consultants: None Procedures performed: None Disposition: Long term care facility Diet recommendation:  Discharge Diet Orders (From admission, onward)     Start     Ordered   02/10/24 0000  Diet - low sodium heart healthy        02/10/24 1059           Cardiac diet DISCHARGE MEDICATION: Allergies as of 02/10/2024       Reactions  Zestril [lisinopril] Cough        Medication List     TAKE these medications    acetaminophen 500 MG tablet Commonly known as: TYLENOL Take 1,000 mg by mouth every 6 (six) hours as needed (pain).   Amantadine HCl 100 MG tablet Take 100 mg by mouth 2 (two) times daily.   amLODipine 10 MG tablet Commonly known as: NORVASC Take 1 tablet (10 mg total) by mouth daily.   apixaban 5 MG  Tabs tablet Commonly known as: ELIQUIS Take 1 tablet (5 mg total) by mouth 2 (two) times daily.   Baclofen 15 MG Tabs Take 15 mg by mouth 3 (three) times daily.   buPROPion 100 MG tablet Commonly known as: WELLBUTRIN Take 100 mg by mouth 3 (three) times daily.   carvedilol 6.25 MG tablet Commonly known as: COREG Take 1 tablet (6.25 mg total) by mouth 2 (two) times daily with a meal.   cholecalciferol 25 MCG (1000 UNIT) tablet Commonly known as: VITAMIN D3 Take 1,000 Units by mouth daily.   divalproex 125 MG DR tablet Commonly known as: DEPAKOTE Take 125 mg by mouth daily.   famotidine 20 MG tablet Commonly known as: PEPCID Take 1 tablet (20 mg total) by mouth 2 (two) times daily.   gabapentin 100 MG capsule Commonly known as: NEURONTIN Take 1 capsule (100 mg total) by mouth 2 (two) times daily.   hydrALAZINE 25 MG tablet Commonly known as: APRESOLINE Take 25 mg by mouth every 6 (six) hours.   hydrOXYzine 50 MG capsule Commonly known as: VISTARIL Take 50 mg by mouth at bedtime.   losartan 25 MG tablet Commonly known as: COZAAR Take 1 tablet (25 mg total) by mouth daily.   magnesium gluconate 500 MG tablet Commonly known as: MAGONATE Take 1 tablet (500 mg total) by mouth at bedtime.   mirtazapine 7.5 MG tablet Commonly known as: REMERON Take 7.5 mg by mouth at bedtime.   multivitamin with minerals Tabs tablet Take 1 tablet by mouth daily.   NUTRITIONAL SUPPLEMENT PO Take 1 each by mouth 2 (two) times daily. Frozen nutritional treat   ondansetron 4 MG tablet Commonly known as: ZOFRAN Take 1 tablet (4 mg total) by mouth every 8 (eight) hours as needed for nausea or vomiting.   polyethylene glycol 17 g packet Commonly known as: MIRALAX / GLYCOLAX Take 17 g by mouth daily.   saccharomyces boulardii 250 MG capsule Commonly known as: FLORASTOR Take 250 mg by mouth daily.   tiZANidine 2 MG tablet Commonly known as: ZANAFLEX Take 2 mg by mouth at  bedtime.        Discharge Exam: Filed Weights   02/09/24 1230  Weight: 45.4 kg   GENERAL:  Alert, agitated, no distress HEENT:  EOMI CARDIOVASCULAR:  RRR, no murmurs appreciated RESPIRATORY:  Clear to auscultation, no wheezing, rales, or rhonchi GASTROINTESTINAL:  Soft, nontender, nondistended EXTREMITIES:  No LE edema bilaterally NEURO: Chronic weakness/spasticity, no new focal deficits appreciated SKIN:  No rashes noted PSYCH:  Appropriate mood and affect    Condition at discharge: improving  The results of significant diagnostics from this hospitalization (including imaging, microbiology, ancillary and laboratory) are listed below for reference.   Imaging Studies: CT HEAD WO CONTRAST Result Date: 02/09/2024 CLINICAL DATA:  Moderate to severe head trauma.  Fall on thinners. EXAM: CT HEAD WITHOUT CONTRAST TECHNIQUE: Contiguous axial images were obtained from the base of the skull through the vertex without intravenous contrast. RADIATION DOSE REDUCTION: This exam was  performed according to the departmental dose-optimization program which includes automated exposure control, adjustment of the mA and/or kV according to patient size and/or use of iterative reconstruction technique. COMPARISON:  CT brain 12/22/2023 FINDINGS: The patient is oblique in the scanner, which limits this examination. The technologist reports the patient was unable to lie on her back and unable to follow instructions. There is also mild motion artifact. Brain: There is mild cortical atrophy, within normal limits for patient age. There is again ex vacuo dilatation of the occipital horn of left lateral ventricle from old left occipital infarct and resultant encephalomalacia. The basilar cisterns are patent. No mass, mass effect, or midline shift. No acute intracranial hemorrhage is seen. No abnormal extra-axial fluid collection. Mild periventricular patchy white matter hypodensities are similar to prior, nonspecific  but most likely secondary to chronic ischemic white matter changes. Preservation of the normal cortical gray-white interface without CT evidence of an acute major vascular territorial cortical based infarction. Vascular: No hyperdense vessel or unexpected calcification. Skull: Normal. Negative for fracture or focal lesion. Sinuses/Orbits: The visualized orbits are unremarkable. 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 Other: None. IMPRESSION: 1. No acute intracranial abnormality is seen. 2. 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 3. Mild cortical atrophy, within normal limits for patient age. Electronically Signed   By: Neita Garnet M.D.   On: 02/09/2024 14:45   DG Chest Port 1 View Result Date: 02/09/2024 CLINICAL DATA:  Blunt trauma. EXAM: PORTABLE CHEST 1 VIEW COMPARISON:  11/10/2023 FINDINGS: The heart size and mediastinal contours are within normal limits. Both lungs are clear. No pneumothorax or hemothorax visualized. The visualized skeletal structures are unremarkable. IMPRESSION: Normal exam. Electronically Signed   By: Danae Orleans M.D.   On: 02/09/2024 12:51   DG Pelvis Portable Result Date: 02/09/2024 CLINICAL DATA:  Blunt trauma.  Pelvic pain. EXAM: PORTABLE PELVIS 1-2 VIEWS COMPARISON:  None Available. FINDINGS: Technically suboptimal exam due to nonstandard positioning. The iliac crests are not included within the field of view on this exam. No fracture seen involving the visualized portion of the pelvis. IMPRESSION: Technically suboptimal exam. No fracture seen involving the visualized portion of the pelvis. Electronically Signed   By: Danae Orleans M.D.   On: 02/09/2024 12:50    Microbiology: Results for orders placed or performed during the hospital encounter of 10/13/23  Blood Culture (routine x 2)     Status: Abnormal   Collection Time: 10/13/23 12:27 PM   Specimen: BLOOD RIGHT HAND  Result Value Ref Range Status   Specimen  Description BLOOD RIGHT HAND  Final   Special Requests   Final    BOTTLES DRAWN AEROBIC AND ANAEROBIC Blood Culture results may not be optimal due to an excessive volume of blood received in culture bottles   Culture  Setup Time   Final    GRAM POSITIVE COCCI IN CLUSTERS ANAEROBIC BOTTLE ONLY CRITICAL RESULT CALLED TO, READ BACK BY AND VERIFIED WITH: PHARMD CARLA JARDIN [id number] AT 1006 BY EC    Culture (A)  Final    STAPHYLOCOCCUS HOMINIS STAPHYLOCOCCUS CAPITIS STAPHYLOCOCCUS EPIDERMIDIS NOT RECOVERED IN CULTURE THE SIGNIFICANCE OF ISOLATING THIS ORGANISM FROM A SINGLE SET OF BLOOD CULTURES WHEN MULTIPLE SETS ARE DRAWN IS UNCERTAIN. PLEASE NOTIFY THE MICROBIOLOGY DEPARTMENT WITHIN ONE WEEK IF SPECIATION AND SENSITIVITIES ARE REQUIRED. Performed at [facility], [address] [place], [place] [address]    Report Status 10/16/2023 FINAL  Final  Blood Culture ID Panel (Reflexed)  Status: Abnormal   Collection Time: 10/13/23 12:27 PM  Result Value Ref Range Status   Enterococcus faecalis NOT DETECTED NOT DETECTED Final   Enterococcus Faecium NOT DETECTED NOT DETECTED Final   Listeria monocytogenes NOT DETECTED NOT DETECTED Final   Staphylococcus species DETECTED (A) NOT DETECTED Final    Comment: CRITICAL RESULT CALLED TO, READ BACK BY AND VERIFIED WITH: PHARMD CARLA JARDIN [id number] AT 1006 BY EC    Staphylococcus aureus (BCID) NOT DETECTED NOT DETECTED Final   Staphylococcus epidermidis DETECTED (A) NOT DETECTED Final    Comment: Methicillin (oxacillin) resistant coagulase negative staphylococcus. Possible blood culture contaminant (unless isolated from more than one blood culture draw or clinical case suggests pathogenicity). No antibiotic treatment is indicated for blood  culture contaminants. CRITICAL RESULT CALLED TO, READ BACK BY AND VERIFIED WITH: PHARMD CARLA JARDIN [id number] AT 1006 BY EC    Staphylococcus lugdunensis NOT DETECTED NOT DETECTED Final   Streptococcus  species NOT DETECTED NOT DETECTED Final   Streptococcus agalactiae NOT DETECTED NOT DETECTED Final   Streptococcus pneumoniae NOT DETECTED NOT DETECTED Final   Streptococcus pyogenes NOT DETECTED NOT DETECTED Final   A.calcoaceticus-baumannii NOT DETECTED NOT DETECTED Final   Bacteroides fragilis NOT DETECTED NOT DETECTED Final   Enterobacterales NOT DETECTED NOT DETECTED Final   Enterobacter cloacae complex NOT DETECTED NOT DETECTED Final   Escherichia coli NOT DETECTED NOT DETECTED Final   Klebsiella aerogenes NOT DETECTED NOT DETECTED Final   Klebsiella oxytoca NOT DETECTED NOT DETECTED Final   Klebsiella pneumoniae NOT DETECTED NOT DETECTED Final   Proteus species NOT DETECTED NOT DETECTED Final   Salmonella species NOT DETECTED NOT DETECTED Final   Serratia marcescens NOT DETECTED NOT DETECTED Final   Haemophilus influenzae NOT DETECTED NOT DETECTED Final   Neisseria meningitidis NOT DETECTED NOT DETECTED Final   Pseudomonas aeruginosa NOT DETECTED NOT DETECTED Final   Stenotrophomonas maltophilia NOT DETECTED NOT DETECTED Final   Candida albicans NOT DETECTED NOT DETECTED Final   Candida auris NOT DETECTED NOT DETECTED Final   Candida glabrata NOT DETECTED NOT DETECTED Final   Candida krusei NOT DETECTED NOT DETECTED Final   Candida parapsilosis NOT DETECTED NOT DETECTED Final   Candida tropicalis NOT DETECTED NOT DETECTED Final   Cryptococcus neoformans/gattii NOT DETECTED NOT DETECTED Final   Methicillin resistance mecA/C DETECTED (A) NOT DETECTED Final    Comment: CRITICAL RESULT CALLED TO, READ BACK BY AND VERIFIED WITH: Alroy Bailiff JARDIN [id number] AT 1006 BY EC Performed at [facility], [address] [place], [place] [address]   Resp panel by RT-PCR (RSV, Flu A&B, Covid) Anterior Nasal Swab     Status: None   Collection Time: 10/13/23 12:28 PM   Specimen: Anterior Nasal Swab  Result Value Ref Range Status   SARS Coronavirus 2 by RT PCR NEGATIVE NEGATIVE Final    Influenza A by PCR NEGATIVE NEGATIVE Final   Influenza B by PCR NEGATIVE NEGATIVE Final    Comment: (NOTE) The Xpert Xpress SARS-CoV-2/FLU/RSV plus assay is intended as an aid in the diagnosis of influenza from Nasopharyngeal swab specimens and should not be used as a sole basis for treatment. Nasal washings and aspirates are unacceptable for Xpert Xpress SARS-CoV-2/FLU/RSV testing.  Fact Sheet for Patients: BloggerCourse.com  Fact Sheet for Healthcare Providers: SeriousBroker.it  This test is not yet approved or cleared by the [place] FDA and has been authorized for detection and/or diagnosis of SARS-CoV-2 by FDA under an Emergency Use Authorization (EUA). This EUA  will remain in effect (meaning this test can be used) for the duration of the COVID-19 declaration under Section 564(b)(1) of the Act, 21 U.S.C. section 360bbb-3(b)(1), unless the authorization is terminated or revoked.     Resp Syncytial Virus by PCR NEGATIVE NEGATIVE Final    Comment: (NOTE) Fact Sheet for Patients: BloggerCourse.com  Fact Sheet for Healthcare Providers: SeriousBroker.it  This test is not yet approved or cleared by the [place] FDA and has been authorized for detection and/or diagnosis of SARS-CoV-2 by FDA under an Emergency Use Authorization (EUA). This EUA will remain in effect (meaning this test can be used) for the duration of the COVID-19 declaration under Section 564(b)(1) of the Act, 21 U.S.C. section 360bbb-3(b)(1), unless the authorization is terminated or revoked.  Performed at [facility], [address], [place], [place] [address]   Blood Culture (routine x 2)     Status: None   Collection Time: 10/14/23  6:16 AM   Specimen: BLOOD RIGHT HAND  Result Value Ref Range Status   Specimen Description BLOOD RIGHT HAND  Final   Special Requests   Final    BOTTLES  DRAWN AEROBIC AND ANAEROBIC Blood Culture adequate volume   Culture   Final    NO GROWTH 5 DAYS Performed at [facility], [address] [place], [place] [address]    Report Status 10/19/2023 FINAL  Final  Culture, blood (Routine X 2) w Reflex to ID Panel     Status: None   Collection Time: 10/15/23 11:00 AM   Specimen: BLOOD LEFT HAND  Result Value Ref Range Status   Specimen Description BLOOD LEFT HAND  Final   Special Requests   Final    BOTTLES DRAWN AEROBIC ONLY Blood Culture results may not be optimal due to an inadequate volume of blood received in culture bottles   Culture   Final    NO GROWTH 5 DAYS Performed at [facility], [address], [place], [place] [address]    Report Status 10/20/2023 FINAL  Final  Culture, blood (Routine X 2) w Reflex to ID Panel     Status: None   Collection Time: 10/15/23 11:03 AM   Specimen: BLOOD LEFT HAND  Result Value Ref Range Status   Specimen Description BLOOD LEFT HAND  Final   Special Requests   Final    BOTTLES DRAWN AEROBIC AND ANAEROBIC Blood Culture adequate volume   Culture   Final    NO GROWTH 5 DAYS Performed at [facility], [address] [place], [place] [address]    Report Status 10/20/2023 FINAL  Final    Labs: CBC: Recent Labs  Lab 02/09/24 1500 02/09/24 1505 02/10/24 0323  WBC 6.7  --  4.6  HGB 13.8 15.0 11.4*  HCT 43.3 44.0 35.8*  MCV 87.7  --  86.3  PLT 245  --  169   Basic Metabolic Panel: Recent Labs  Lab 02/09/24 1500 02/09/24 1505 02/10/24 0323  NA 140 142 141  K 4.8 4.3 3.7  CL 108 113* 108  CO2 21*  --  21*  GLUCOSE 77 78 57*  BUN 16 23* 14  CREATININE 0.77 0.70 0.76  CALCIUM 11.9*  --  10.5*  MG  --   --  1.7  PHOS  --   --  3.1   Liver Function Tests: Recent Labs  Lab 02/09/24 1500  AST 38  ALT 18  ALKPHOS 82  BILITOT 1.2  PROT 7.8  ALBUMIN 3.9   CBG: Recent Labs  Lab 02/10/24 1038  GLUCAP 104*    Discharge time spent: less than 30  minutes.  Signed: Deanna Artis, DO [facility] 02/10/2024

## 2024-02-10 NOTE — ED Notes (Signed)
 Re-eval after Labs

## 2024-02-10 NOTE — ED Notes (Signed)
 [facility] Called for pick up no ETA

## 2024-02-10 NOTE — ED Notes (Signed)
 Pt refusing CBG to be rechekced after receiving D50. Pt also refusing her morning medications at this time. [place] DO notified

## 2024-02-10 NOTE — ED Notes (Signed)
 Just assumed care of patient Patient is laying in bed sleep on full cardiac monitor with pulse ox. Patient is in no noted distress at the present time will continue to monitor for any changes.

## 2024-02-10 NOTE — ED Notes (Signed)
 Patient is laying in bed sleep on full cardiac monitor with pulse ox. Patient is in no noted distress at the present time will continue to monitor for any changes.

## 2024-02-12 ENCOUNTER — Emergency Department (HOSPITAL_COMMUNITY): Payer: Medicaid Other

## 2024-02-12 ENCOUNTER — Other Ambulatory Visit: Payer: Self-pay

## 2024-02-12 ENCOUNTER — Encounter (HOSPITAL_COMMUNITY): Payer: Self-pay

## 2024-02-12 ENCOUNTER — Emergency Department (HOSPITAL_COMMUNITY)
Admission: EM | Admit: 2024-02-12 | Discharge: 2024-02-13 | Disposition: A | Discharge status: Home / Self Care | Payer: Medicaid Other | Attending: Emergency Medicine | Admitting: Emergency Medicine

## 2024-02-12 DIAGNOSIS — S0993XA Unspecified injury of face, initial encounter: Secondary | ICD-10-CM | POA: Diagnosis present

## 2024-02-12 DIAGNOSIS — Z79899 Other long term (current) drug therapy: Secondary | ICD-10-CM | POA: Insufficient documentation

## 2024-02-12 DIAGNOSIS — Z7901 Long term (current) use of anticoagulants: Secondary | ICD-10-CM | POA: Diagnosis not present

## 2024-02-12 DIAGNOSIS — S8012XA Contusion of left lower leg, initial encounter: Secondary | ICD-10-CM | POA: Diagnosis not present

## 2024-02-12 DIAGNOSIS — S0083XA Contusion of other part of head, initial encounter: Secondary | ICD-10-CM

## 2024-02-12 DIAGNOSIS — F0390 Unspecified dementia without behavioral disturbance: Secondary | ICD-10-CM | POA: Insufficient documentation

## 2024-02-12 DIAGNOSIS — S60221A Contusion of right hand, initial encounter: Secondary | ICD-10-CM

## 2024-02-12 DIAGNOSIS — W050XXA Fall from non-moving wheelchair, initial encounter: Secondary | ICD-10-CM | POA: Insufficient documentation

## 2024-02-12 DIAGNOSIS — W19XXXA Unspecified fall, initial encounter: Secondary | ICD-10-CM

## 2024-02-12 LAB — PARATHYROID HORMONE, INTACT (NO CA): PTH: 34 pg/mL (ref 15–65)

## 2024-02-12 NOTE — Discharge Instructions (Addendum)
 Return if any problems.

## 2024-02-12 NOTE — ED Notes (Addendum)
 Patient transported to x-ray. ?

## 2024-02-12 NOTE — ED Provider Notes (Signed)
 [place] EMERGENCY DEPARTMENT AT [facility] Provider Note   CSN: [id number] Arrival date & time: 02/12/24  2144     History  Chief Complaint  Patient presents with   Sarah Myers is a 53 y.o. female.  Patient is reported to have fallen out of her wheelchair.  Patient did strike her head.  Patient has pain in her left thigh, right hand and her head.  Patient is reported to have not lost consciousness.  She is on Eliquis.  Patient is unable to provide very much history.  She has a history of intracranial hemorrhage with left-sided hemiplegia and cognitive impairment.  EMS reports fall was witnessed.  The history is provided by the patient. No language interpreter was used.  Fall       Home Medications Prior to Admission medications   Medication Sig Start Date End Date Taking? Authorizing Provider  acetaminophen (TYLENOL) 500 MG tablet Take 1,000 mg by mouth every 6 (six) hours as needed (pain).    [provider]  Amantadine HCl 100 MG tablet Take 100 mg by mouth 2 (two) times daily. 09/15/22   [provider]  amLODipine (NORVASC) 10 MG tablet Take 1 tablet (10 mg total) by mouth daily. 06/22/22   Raulkar, Drema Pry, MD  apixaban (ELIQUIS) 5 MG TABS tablet Take 1 tablet (5 mg total) by mouth 2 (two) times daily. 06/22/22   Raulkar, Drema Pry, MD  Baclofen 15 MG TABS Take 15 mg by mouth 3 (three) times daily.    [provider]  buPROPion (WELLBUTRIN) 100 MG tablet Take 100 mg by mouth 3 (three) times daily.    [provider]  carvedilol (COREG) 6.25 MG tablet Take 1 tablet (6.25 mg total) by mouth 2 (two) times daily with a meal. 06/22/22   Raulkar, Drema Pry, MD  cholecalciferol (VITAMIN D3) 25 MCG (1000 UNIT) tablet Take 1,000 Units by mouth daily.    [provider]  divalproex (DEPAKOTE) 125 MG DR tablet Take 125 mg by mouth daily.    [provider]  famotidine (PEPCID) 20 MG tablet Take 1 tablet (20  mg total) by mouth 2 (two) times daily. 05/22/22   Raulkar, Drema Pry, MD  gabapentin (NEURONTIN) 100 MG capsule Take 1 capsule (100 mg total) by mouth 2 (two) times daily. 05/24/22   Raulkar, Drema Pry, MD  hydrALAZINE (APRESOLINE) 25 MG tablet Take 25 mg by mouth every 6 (six) hours. 10/10/22   [provider]  hydrOXYzine (VISTARIL) 50 MG capsule Take 50 mg by mouth at bedtime.    [provider]  losartan (COZAAR) 25 MG tablet Take 1 tablet (25 mg total) by mouth daily. 06/09/22   Raulkar, Drema Pry, MD  magnesium gluconate (MAGONATE) 500 MG tablet Take 1 tablet (500 mg total) by mouth at bedtime. 05/22/22   Raulkar, Drema Pry, MD  mirtazapine (REMERON) 7.5 MG tablet Take 7.5 mg by mouth at bedtime.    [provider]  Multiple Vitamin (MULTIVITAMIN WITH MINERALS) TABS tablet Take 1 tablet by mouth daily. 05/22/22   Raulkar, Drema Pry, MD  Nutritional Supplements (NUTRITIONAL SUPPLEMENT PO) Take 1 each by mouth 2 (two) times daily. Frozen nutritional treat    [provider]  ondansetron (ZOFRAN) 4 MG tablet Take 1 tablet (4 mg total) by mouth every 8 (eight) hours as needed for nausea or vomiting. 10/17/22   Tegeler, Canary Brim, MD  polyethylene glycol (MIRALAX / GLYCOLAX) 17 g  packet Take 17 g by mouth daily. 10/17/23   Meredeth Ide, MD  saccharomyces boulardii (FLORASTOR) 250 MG capsule Take 250 mg by mouth daily.    [provider]  tiZANidine (ZANAFLEX) 2 MG tablet Take 2 mg by mouth at bedtime.    [provider]      Allergies    Zestril [lisinopril]    Review of Systems   Review of Systems  Unable to perform ROS: Dementia  All other systems reviewed and are negative.   Physical Exam Updated Vital Signs BP (!) 162/111   Pulse 81   Temp 98 F (36.7 C) (Rectal)   Resp 20   Ht 5\' 5"  (1.651 m)   Wt 45 kg   SpO2 100%   BMI 16.51 kg/m  Physical Exam Vitals and nursing note reviewed.  Constitutional:      Appearance: She is  well-developed.  HENT:     Head: Normocephalic.     Comments: Bruising left forehead.    Mouth/Throat:     Mouth: Mucous membranes are moist.  Cardiovascular:     Rate and Rhythm: Normal rate.  Pulmonary:     Effort: Pulmonary effort is normal.  Abdominal:     General: There is no distension.  Musculoskeletal:     Cervical back: Normal range of motion.     Comments: Contracture left leg, tender left thigh.  Skin:    General: Skin is warm.  Neurological:     Mental Status: She is alert.     Comments: Left-sided contracture decreased range of motion all extremities tender left femur bruised right hand.    ED Results / Procedures / Treatments   Labs (all labs ordered are listed, but only abnormal results are displayed) Labs Reviewed - No data to display  EKG None  Radiology No results found.  Procedures Procedures    Medications Ordered in ED Medications - No data to display  ED Course/ Medical Decision Making/ A&P                                 Medical Decision Making Pt fell out of  a wheelchari    Amount and/or Complexity of Data Reviewed Independent Historian: EMS    Details: History provided to EMS by facility  External Data Reviewed: notes.    Details: Previous hospital notes reviewed Radiology: ordered and independent interpretation performed. Decision-making details documented in ED Course.    Details: Ct head  no acute  Xray right hand and left femur  no fracture            Final Clinical Impression(s) / ED Diagnoses Final diagnoses:  Fall, initial encounter  Contusion of face, initial encounter  Contusion of left lower extremity, initial encounter  Contusion of right hand, initial encounter    Rx / DC Orders ED Discharge Orders     None      An After Visit Summary was printed and given to the patient.    Elson Areas, Cordelia Poche 02/12/24 2333    Virgina Norfolk, DO 02/13/24 1409

## 2024-02-12 NOTE — ED Notes (Signed)
 Report called to [facility] RN @ [facility]  [facility] called - no ETA at this time

## 2024-02-12 NOTE — ED Triage Notes (Signed)
 Patient BIB [facility] from [facility] due to fall. Staff reports witnessing patient slip out of wheelchair onto the ground hitting her head. Patient on blood thinners. Patient has bruises and bumps from old fall according to staff and EMS. Staff reports visual and auditory hallucinations and staff was unable to give baseline. Patient is alert but not oriented at this time. No PIV.

## 2024-02-21 ENCOUNTER — Encounter
Payer: Medicaid Other | Attending: Physical Medicine and Rehabilitation | Admitting: Physical Medicine and Rehabilitation

## 2024-02-21 VITALS — BP 198/127 | HR 94 | Ht 65.0 in | Wt 92.0 lb

## 2024-02-21 DIAGNOSIS — G4701 Insomnia due to medical condition: Secondary | ICD-10-CM | POA: Diagnosis not present

## 2024-02-21 DIAGNOSIS — I10 Essential (primary) hypertension: Secondary | ICD-10-CM | POA: Diagnosis not present

## 2024-02-21 DIAGNOSIS — M79609 Pain in unspecified limb: Secondary | ICD-10-CM | POA: Diagnosis present

## 2024-02-21 DIAGNOSIS — R252 Cramp and spasm: Secondary | ICD-10-CM | POA: Diagnosis not present

## 2024-02-22 NOTE — Progress Notes (Signed)
 Subjective:    Patient ID: Sarah Myers, female    DOB: 1971-02-18, 53 y.o.   MRN: [id number]  HPI Mrs. Priego presents for f/u of insomnia, impaired mobility and ADLs due to stroke, and spasticity  1) Insomnia -sleeping poorly again -she takes tizanidine 2mg  and baclofen 15mg  at night  2) Spasticity -she takes baclofen 15mg  TID and tizanidine 2mg  HS -still tight in upper and lower extremities at times -she had a good response to last Botox -daughter thinks patient developed hallucinations in response to tizanidine and these have resolved since she has stopped tizanidine -she has restarted Baclofen -Botox has helped but she is still tight -she is willing to try Nexwave today  3) Swelling of all limbs -improved -the DME company no longer provides compression garments  4) Frequent urination -improved -no dysuria  5) Impaired mobility -her daughter would like to be able to bring her to her doctor's appointments but is unable to due to lack of transportation. 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  -she is now in a facility at [facility] -she wants to come home.   6) Hallucinations: -daughter notes these have started after she started taking Tizanidine -has resolved since tizanidine has stopped  7) Anxiety -daughter notes it continues to be severe at times -she has picked up hydroxyzine and will try this, daughter feels this has helped -she is having a lot of anxiety this morning -her daughter gave her hydroxyzine at 5am and asks what she should do now -she is willing to try additional anxiety medication in between hydroxyzine if this is not effective enough -she is willing to speak with a pyschiatrist  8) Impaired memory and cognition -daughter notes impaired memory and cognition and  asks if her mom could be getting early dementia, she asks what can be done for this, she asks if any testing is needed  9) Decreased appetite: --her daughter is concerned about her losing weight  10) Daytime somnolence  11) Depression: her daughter would like for her to see a psychologist for therapy.   12) HTN -BP continues to be elevated today  13) Pain at elbow  Pain Inventory Average Pain 10 Pain Right Now 10 My pain is aching  In the last 24 hours, has pain interfered with the following? General activity 10 Relation with others 10 Enjoyment of life 10 What TIME of day is your pain at its worst? varies Sleep (in general) Fair  Pain is worse with: some activites Pain improves with:  . Relief from Meds:  .  Family History  Problem Relation Age of Onset   Diabetes Mother    Hypertension Mother    Heart disease Mother    Diabetes Maternal Aunt    Stroke Maternal Uncle    Diabetes Maternal Grandmother    Heart disease Maternal Grandmother    Diabetes Maternal Grandfather    Heart disease Maternal Grandfather    Heart disease Paternal Grandmother    Heart disease Paternal Grandfather    Social History   Socioeconomic History   Marital status: Widowed    Spouse name: Not on file   Number of children: Not on file   Years of education: Not on file   Highest education level: Not on file  Occupational History   Not  on file  Tobacco Use   Smoking status: Never   Smokeless tobacco: Never  Vaping Use   Vaping status: Never Used  Substance and Sexual Activity   Alcohol use: Not Currently    Alcohol/week: 0.0 standard drinks of alcohol   Drug use: No   Sexual activity: Never  Other Topics Concern   Not on file  Social History Narrative   Not on file   Social Drivers of Health   Financial Resource Strain: Not on file  Food Insecurity: Not on file  Transportation Needs: Not on file  Physical Activity: Not on file  Stress: Not on file  Social Connections:  Not on file   Past Surgical History:  Procedure Laterality Date   BREAST LUMPECTOMY  1992   CESAREAN SECTION     IR GASTROSTOMY TUBE MOD SED  03/06/2022   ORIF RADIAL FRACTURE  02/10/2012   Procedure: OPEN REDUCTION INTERNAL FIXATION (ORIF) RADIAL FRACTURE;  Surgeon: Cammy Copa, MD;  Location: [facility] OR;  Service: Orthopedics;  Laterality: Right;   ORIF ULNAR FRACTURE  02/10/2012   Procedure: OPEN REDUCTION INTERNAL FIXATION (ORIF) ULNAR FRACTURE;  Surgeon: Cammy Copa, MD;  Location: [facility] OR;  Service: Orthopedics;  Laterality: Right;   TUBAL LIGATION     Past Surgical History:  Procedure Laterality Date   BREAST LUMPECTOMY  1992   CESAREAN SECTION     IR GASTROSTOMY TUBE MOD SED  03/06/2022   ORIF RADIAL FRACTURE  02/10/2012   Procedure: OPEN REDUCTION INTERNAL FIXATION (ORIF) RADIAL FRACTURE;  Surgeon: Cammy Copa, MD;  Location[facility] OR;  Service: Orthopedics;  Laterality: Right;   ORIF ULNAR FRACTURE  02/10/2012   Procedure: OPEN REDUCTION INTERNAL FIXATION (ORIF) ULNAR FRACTURE;  Surgeon: Cammy Copa, MD;  Location: [facility] OR;  Service: Orthopedics;  Laterality: Right;   TUBAL LIGATION     Past Medical History:  Diagnosis Date   Hypertension    Stroke (HCC)    BP (!) 198/127   Pulse 94   Ht 5\' 5"  (1.651 m)   Wt 92 lb (41.7 kg)   SpO2 99%   BMI 15.31 kg/m   Opioid Risk Score:   Fall Risk Score:  `1  Depression screen [facility] 2/9     08/23/2023    9:09 AM 09/19/2021    8:21 AM 06/29/2020   10:04 AM 05/18/2020    3:14 PM 10/17/2016   11:46 AM 09/26/2016   11:01 AM 12/23/2014   11:09 AM  Depression screen PHQ 2/9  Decreased Interest 0 0 2 0 2 3 0  Down, Depressed, Hopeless 0 3 3 0 3 1 1   PHQ - 2 Score 0 3 5 0 5 4 1   Altered sleeping  0 3  1 0   Tired, decreased energy  0 2  2 1    Change in appetite  0 2  1 0   Feeling bad or failure about yourself   2 0  1 2   Trouble concentrating  0 0  2 0   Moving slowly or fidgety/restless  0 0  0 0   Suicidal  thoughts  0 0  0 0   PHQ-9 Score  5 12  12 7       Review of Systems  Musculoskeletal:        Left arm pain  All other systems reviewed and are negative.  +swelling of all limbs, +spasticity, +insomnia, +hallucinations    Objective:   Physical Exam Gen:  no distress, normal appearing, BP 151/97 HEENT: oral mucosa pink and moist, NCAT Cardio: Reg rate Chest: normal effort, normal rate of breathing Abd: soft, non-distended Ext: no edema Psych: pleasant, normal affect Skin: intact Neuro: Falling asleep in office today but able to state that she is tired, MAS 3 in LUE and LLE       Assessment & Plan:   1) Insomnia -improved -increase tizanidine to 4mg  HS  2) Pain from spasticity See below -increase baclofen to 15mg  TID -continue Botox q3 months, will plan for 400U in LUE and LLE next visit Prescribed Zynex Nexwave -Discussed current symptoms of pain and history of pain.  -Discussed benefits of exercise in reducing pain. -Discussed following foods that may reduce pain: 1) Ginger (especially studied for arthritis)- reduce leukotriene production to decrease inflammation 2) Blueberries- high in phytonutrients that decrease inflammation 3) Salmon- marine omega-3s reduce joint swelling and pain 4) Pumpkin seeds- reduce inflammation 5) dark chocolate- reduces inflammation 6) turmeric- reduces inflammation 7) tart cherries - reduce pain and stiffness 8) extra virgin olive oil - its compound olecanthal helps to block prostaglandins  9) chili peppers- can be eaten or applied topically via capsaicin 10) mint- helpful for headache, muscle aches, joint pain, and itching 11) garlic- reduces inflammation  Link to further information on diet for chronic pain: [email or web address]   3) Swelling of limbs: -improved -prescribed extra large compression garments for bilateral lower extremities, small for bilateral  upper extremities, discussed that she could purchase these online and send receipt to insurance for reimbursement  4) Impaired mobility and ADLs -continue PT, OT -reached out to SW and emailed patient [employer] [place] forms to complete for transportation -helped daughter to fill out each item on the [employer] [place] forms and asked her to mail forms to our office for [place] to send on her behalf since she does not have a fax machine -discussed every question on [employer] [place] form and guiding daughter on how to answer questions accurately based on her mother's condition. There is a Part B on the form that needs to be filled by [place], discussed with daughter that she can mail this to our office and I can completed this portion before we fax the form for her, discussed with patient and daughter her major limitations of left sided weakness and how this inhibits her from walking, her inability to navigate the stairs in her apartment which has thus far prevented her from leaving her apartment, the fact that she does not have a ramp, her difficulty in getting to and from doctor's appointments due to this impaired mobility, the fact that she does not have a wheelchair ramp at home -[employer] [place] to call patient to answer some of her questions -provided note stating that daughter has been unable to work due to caring for her mother full time at home  4) Anxiety -continue hydroxyzine prn -prescribed buspar 5mg  TID PRN for use in between hydroxyzine if this is not effective enough -referred to psychiatry -discussed eating 2 [place] nuts per day -discussed decreasing Zoloft to 25mg  since daughter would prefer to use PRN medication for her -discussed benefits of meditation and exercise -Discussed exercise and meditation as tools to decrease anxiety. -Recommended Down Dog Yoga app -Discussed spending time outdoors. -Discussed positive re-framing of anxiety.  -Discussed the following foods that have been show to reduce  anxiety: 1) [place] nuts, mushrooms, soy beans due to their high selenium content. Upper limit of toxicity of selenium is 485mcg/day so no  more than 3-4 [place] nuts per day.  2) Fatty fish such as salmon, mackerel, sardines, trout, and herring- high in omega-3 fatty acids 3) Eggs- increases serotonin and dopamine 4) Pumpkin seeds- high in omega-3 fatty acids 5) dark chocolate- high in flavanols that increase blood flow to brain 6) turmeric- take with black pepper to increase absorption 7) chamomile tea- antioxidant and anti-inflammatory properties 8) yogurt without sugar- supports gut-brain axis 9) green tea- contains L- theanine 10) blueberries- high in vitamin C and antioxidants 11) [place]- high in tryptophan which gets converted to serotonin 12) bell peppers- rich in vitamin C and antioxidants 13) citrus fruits- rich in vitamin C and antioxidants 14) almonds- high in vitamin E and healthy fats 15) chia seeds- high in omega-3 fatty acids  5) Polypharmacy -reviewed all medications, their indications, and potential side effects with daughter.   6) GERD -recommended using pepcid PRN  7) HTN -increase tizandine to 4mg  HS -return in 2 weeks to recheck BP -continue Cozaar -increase baclofen to 15mg  TID -drink 6-8 glasses of water per day to protect kidneys while using Cozaar -continue magnesium supplement -discussed excellent BM -Advised checking BP daily at home and logging results to bring into follow-up appointment with PCP and myself. -Reviewed BP meds today.  -Advised checking BP daily at home and logging results to bring into follow-up appointment with PCP and myself. -Reviewed BP meds today.  -Advised checking BP daily at home and logging results to bring into follow-up appointment with PCP and myself. -Reviewed BP meds today.  -Advised regarding healthy foods that can help lower blood pressure and provided with a list: 1) citrus foods- high in vitamins and minerals 2) salmon  and other fatty fish - reduces inflammation and oxylipins 3) swiss chard (leafy green)- high level of nitrates 4) pumpkin seeds- one of the best natural sources of magnesium 5) Beans and lentils- high in fiber, magnesium, and potassium 6) Berries- high in flavonoids 7) Amaranth (whole grain, can be cooked similarly to rice and oats)- high in magnesium and fiber 8) Pistachios- even more effective at reducing BP than other nuts 9) Carrots- high in phenolic compounds that relax blood vessels and reduce inflammation 10) Celery- contain phthalides that relax tissues of arterial walls 11) Tomatoes- can also improve cholesterol and reduce risk of heart disease 12) Broccoli- good source of magnesium, calcium, and potassium 13) Greek yogurt: high in potassium and calcium 14) Herbs and spices: Celery seed, cilantro, saffron, lemongrass, black cumin, ginseng, cinnamon, cardamom, sweet basil, and ginger 15) Chia and flax seeds- also help to lower cholesterol and blood sugar 16) Beets- high levels of nitrates that relax blood vessels  17) spinach and bananas- high in potassium  -Provided lise of supplements that can help with hypertension:  1) magnesium: one high quality brand is Bioptemizers since it contains all 7 types of magnesium, otherwise over the counter magnesium gluconate 400mg  is a good option 2) B vitamins 3) vitamin D 4) potassium 5) CoQ10 6) L-arginine 7) Vitamin C 8) Beetroot -Educated that goal BP is 120/80. -Made goal to incorporate some of the above foods into diet.    8) History of CVA -continue multivitamin  9) Cognitive impairment:  -discussed that cognitive impairment can be secondary to stroke, her baclofen medication -discussed that neurology can help with cognitive testing to assess for dementia -discussed patient's risk for vascular demential given stroke and cardiac history -recommended avoiding processed foods such as added sugar, bread/pasta/rice -discussed that  insulin resistance can worsen  cognitive impairment -recommended eating fruits, vegetables, lean meats, olive oil, nuts, and yogurt. Highlighted salmon, blueberries and walnuts as foods that enhance cognition  10) Pain from spasticity:  --Provided with a pain relief journal and discussed that it contains foods and lifestyle tips to naturally help to improve pain. Discussed that these lifestyle strategies are also very good for health unlike some medications which can have negative side effects. Discussed that the act of keeping a journal can be therapeutic and helpful to realize patterns what helps to trigger and alleviate pain.   -continue baclofen -prescribed Zynex Nexwave -increase tizanidine to 4mg  HS -prescribed OT and PT  11) Depression: -referred to Dr. Kieth Brightly, neuropsych  12) decreased appetite: -discussed mirtazepine but patient defers at this time

## 2024-03-13 ENCOUNTER — Encounter: Admitting: Physical Medicine and Rehabilitation

## 2024-04-14 ENCOUNTER — Encounter (HOSPITAL_COMMUNITY): Payer: Self-pay | Admitting: Emergency Medicine

## 2024-04-14 ENCOUNTER — Emergency Department (HOSPITAL_COMMUNITY)

## 2024-04-14 ENCOUNTER — Observation Stay (HOSPITAL_COMMUNITY)

## 2024-04-14 ENCOUNTER — Other Ambulatory Visit: Payer: Self-pay

## 2024-04-14 ENCOUNTER — Inpatient Hospital Stay (HOSPITAL_COMMUNITY)
Admission: EM | Admit: 2024-04-14 | Discharge: 2024-04-17 | DRG: 057 | Disposition: A | Discharge status: Skilled Nursing Facility | Source: Skilled Nursing Facility | Attending: Internal Medicine | Admitting: Internal Medicine

## 2024-04-14 DIAGNOSIS — Z79899 Other long term (current) drug therapy: Secondary | ICD-10-CM

## 2024-04-14 DIAGNOSIS — D509 Iron deficiency anemia, unspecified: Secondary | ICD-10-CM | POA: Diagnosis present

## 2024-04-14 DIAGNOSIS — I69398 Other sequelae of cerebral infarction: Principal | ICD-10-CM

## 2024-04-14 DIAGNOSIS — F411 Generalized anxiety disorder: Secondary | ICD-10-CM | POA: Diagnosis present

## 2024-04-14 DIAGNOSIS — R569 Unspecified convulsions: Secondary | ICD-10-CM | POA: Diagnosis not present

## 2024-04-14 DIAGNOSIS — F0393 Unspecified dementia, unspecified severity, with mood disturbance: Secondary | ICD-10-CM | POA: Diagnosis present

## 2024-04-14 DIAGNOSIS — Z8673 Personal history of transient ischemic attack (TIA), and cerebral infarction without residual deficits: Secondary | ICD-10-CM

## 2024-04-14 DIAGNOSIS — I619 Nontraumatic intracerebral hemorrhage, unspecified: Secondary | ICD-10-CM | POA: Diagnosis present

## 2024-04-14 DIAGNOSIS — I10 Essential (primary) hypertension: Secondary | ICD-10-CM | POA: Diagnosis present

## 2024-04-14 DIAGNOSIS — F0394 Unspecified dementia, unspecified severity, with anxiety: Secondary | ICD-10-CM | POA: Diagnosis present

## 2024-04-14 DIAGNOSIS — D696 Thrombocytopenia, unspecified: Secondary | ICD-10-CM | POA: Diagnosis present

## 2024-04-14 DIAGNOSIS — Z8249 Family history of ischemic heart disease and other diseases of the circulatory system: Secondary | ICD-10-CM

## 2024-04-14 DIAGNOSIS — G40909 Epilepsy, unspecified, not intractable, without status epilepticus: Principal | ICD-10-CM | POA: Diagnosis present

## 2024-04-14 DIAGNOSIS — I69354 Hemiplegia and hemiparesis following cerebral infarction affecting left non-dominant side: Secondary | ICD-10-CM

## 2024-04-14 DIAGNOSIS — Z888 Allergy status to other drugs, medicaments and biological substances status: Secondary | ICD-10-CM

## 2024-04-14 DIAGNOSIS — Z7401 Bed confinement status: Secondary | ICD-10-CM

## 2024-04-14 DIAGNOSIS — K5900 Constipation, unspecified: Secondary | ICD-10-CM | POA: Diagnosis present

## 2024-04-14 DIAGNOSIS — Z833 Family history of diabetes mellitus: Secondary | ICD-10-CM

## 2024-04-14 DIAGNOSIS — I2699 Other pulmonary embolism without acute cor pulmonale: Secondary | ICD-10-CM | POA: Diagnosis not present

## 2024-04-14 DIAGNOSIS — Z681 Body mass index (BMI) 19 or less, adult: Secondary | ICD-10-CM

## 2024-04-14 DIAGNOSIS — Z86711 Personal history of pulmonary embolism: Secondary | ICD-10-CM

## 2024-04-14 DIAGNOSIS — Z7901 Long term (current) use of anticoagulants: Secondary | ICD-10-CM

## 2024-04-14 DIAGNOSIS — Z823 Family history of stroke: Secondary | ICD-10-CM

## 2024-04-14 DIAGNOSIS — R636 Underweight: Secondary | ICD-10-CM | POA: Diagnosis present

## 2024-04-14 LAB — COMPREHENSIVE METABOLIC PANEL WITH GFR
ALT: 12 U/L (ref 0–44)
AST: 23 U/L (ref 15–41)
Albumin: 3.4 g/dL — ABNORMAL LOW (ref 3.5–5.0)
Alkaline Phosphatase: 57 U/L (ref 38–126)
Anion gap: 10 (ref 5–15)
BUN: 17 mg/dL (ref 6–20)
CO2: 22 mmol/L (ref 22–32)
Calcium: 10.2 mg/dL (ref 8.9–10.3)
Chloride: 106 mmol/L (ref 98–111)
Creatinine, Ser: 0.74 mg/dL (ref 0.44–1.00)
GFR, Estimated: >60 mL/min (ref >60)
Glucose, Bld: 128 mg/dL — ABNORMAL HIGH (ref 70–99)
Potassium: 4.8 mmol/L (ref 3.5–5.1)
Sodium: 138 mmol/L (ref 135–145)
Total Bilirubin: 0.6 mg/dL (ref 0.0–1.2)
Total Protein: 7.1 g/dL (ref 6.5–8.1)

## 2024-04-14 LAB — URINALYSIS, ROUTINE W REFLEX MICROSCOPIC
Bilirubin Urine: NEGATIVE
Glucose, UA: NEGATIVE mg/dL
Hgb urine dipstick: NEGATIVE
Ketones, ur: NEGATIVE mg/dL
Leukocytes,Ua: NEGATIVE
Nitrite: NEGATIVE
Protein, ur: 30 mg/dL — AB
Specific Gravity, Urine: 1.018 (ref 1.005–1.030)
pH: 6 (ref 5.0–8.0)

## 2024-04-14 LAB — RAPID URINE DRUG SCREEN, HOSP PERFORMED
Amphetamines: NOT DETECTED
Barbiturates: NOT DETECTED
Benzodiazepines: POSITIVE — AB
Cocaine: NOT DETECTED
Opiates: NOT DETECTED
Tetrahydrocannabinol: NOT DETECTED

## 2024-04-14 LAB — CBG MONITORING, ED: Glucose-Capillary: 113 mg/dL — ABNORMAL HIGH (ref 70–99)

## 2024-04-14 LAB — I-STAT VENOUS BLOOD GAS, ED
Acid-Base Excess: 3 mmol/L — ABNORMAL HIGH (ref 0.0–2.0)
Bicarbonate: 27.1 mmol/L (ref 20.0–28.0)
Calcium, Ion: 1.28 mmol/L (ref 1.15–1.40)
HCT: 41 % (ref 36.0–46.0)
Hemoglobin: 13.9 g/dL (ref 12.0–15.0)
O2 Saturation: 99 %
Potassium: 4.7 mmol/L (ref 3.5–5.1)
Sodium: 139 mmol/L (ref 135–145)
TCO2: 28 mmol/L (ref 22–32)
pCO2, Ven: 40.9 mmHg — ABNORMAL LOW (ref 44–60)
pH, Ven: 7.43 (ref 7.25–7.43)
pO2, Ven: 114 mmHg — ABNORMAL HIGH (ref 32–45)

## 2024-04-14 LAB — CBC WITH DIFFERENTIAL/PLATELET
Abs Immature Granulocytes: 0.02 10*3/uL (ref 0.00–0.07)
Basophils Absolute: 0 10*3/uL (ref 0.0–0.1)
Basophils Relative: 0 %
Eosinophils Absolute: 0 10*3/uL (ref 0.0–0.5)
Eosinophils Relative: 0 %
HCT: 43.3 % (ref 36.0–46.0)
Hemoglobin: 13.7 g/dL (ref 12.0–15.0)
Immature Granulocytes: 0 %
Lymphocytes Relative: 7 %
Lymphs Abs: 0.5 10*3/uL — ABNORMAL LOW (ref 0.7–4.0)
MCH: 28 pg (ref 26.0–34.0)
MCHC: 31.6 g/dL (ref 30.0–36.0)
MCV: 88.4 fL (ref 80.0–100.0)
Monocytes Absolute: 0.1 10*3/uL (ref 0.1–1.0)
Monocytes Relative: 2 %
Neutro Abs: 7.2 10*3/uL (ref 1.7–7.7)
Neutrophils Relative %: 91 %
Platelets: 114 10*3/uL — ABNORMAL LOW (ref 150–400)
RBC: 4.9 MIL/uL (ref 3.87–5.11)
RDW: 13.2 % (ref 11.5–15.5)
WBC: 7.9 10*3/uL (ref 4.0–10.5)
nRBC: 0 % (ref 0.0–0.2)

## 2024-04-14 LAB — ETHANOL: Alcohol, Ethyl (B): <15 mg/dL (ref <15)

## 2024-04-14 LAB — AMMONIA: Ammonia: 41 umol/L — ABNORMAL HIGH (ref 9–35)

## 2024-04-14 LAB — I-STAT CG4 LACTIC ACID, ED: Lactic Acid, Venous: 1.9 mmol/L (ref 0.5–1.9)

## 2024-04-14 LAB — HCG, SERUM, QUALITATIVE: Preg, Serum: NEGATIVE

## 2024-04-14 LAB — VALPROIC ACID LEVEL: Valproic Acid Lvl: 19 ug/mL — ABNORMAL LOW (ref 50–100)

## 2024-04-14 MED ORDER — VALPROATE SODIUM 100 MG/ML IV SOLN
800.0000 mg | Freq: Once | INTRAVENOUS | Status: AC
  Administered 2024-04-14: 800 mg via INTRAVENOUS
  Filled 2024-04-14: qty 8

## 2024-04-14 MED ORDER — VALPROATE SODIUM 100 MG/ML IV SOLN
250.0000 mg | Freq: Three times a day (TID) | INTRAVENOUS | Status: DC
  Filled 2024-04-14 (×2): qty 2.5

## 2024-04-14 MED ORDER — ENOXAPARIN SODIUM 40 MG/0.4ML IJ SOSY
40.0000 mg | PREFILLED_SYRINGE | Freq: Two times a day (BID) | INTRAMUSCULAR | Status: DC
  Administered 2024-04-15 (×3): 40 mg via SUBCUTANEOUS
  Filled 2024-04-14 (×3): qty 0.4

## 2024-04-14 MED ORDER — LEVETIRACETAM IN NACL 1000 MG/100ML IV SOLN
1000.0000 mg | INTRAVENOUS | Status: AC
  Administered 2024-04-14: 1000 mg via INTRAVENOUS
  Filled 2024-04-14: qty 100

## 2024-04-14 MED ORDER — MIDAZOLAM HCL 2 MG/2ML IJ SOLN
2.0000 mg | Freq: Once | INTRAMUSCULAR | Status: AC
  Administered 2024-04-14: 2 mg via INTRAVENOUS
  Filled 2024-04-14: qty 2

## 2024-04-14 MED ORDER — LEVETIRACETAM IN NACL 500 MG/100ML IV SOLN
500.0000 mg | Freq: Two times a day (BID) | INTRAVENOUS | Status: DC
  Administered 2024-04-15 (×2): 500 mg via INTRAVENOUS
  Filled 2024-04-14 (×3): qty 100

## 2024-04-14 MED ORDER — HYDRALAZINE HCL 20 MG/ML IJ SOLN
10.0000 mg | INTRAMUSCULAR | Status: DC | PRN
  Administered 2024-04-15: 10 mg via INTRAVENOUS
  Filled 2024-04-14 (×2): qty 1

## 2024-04-14 MED ORDER — VALPROATE SODIUM 100 MG/ML IV SOLN
500.0000 mg | Freq: Two times a day (BID) | INTRAVENOUS | Status: DC
  Filled 2024-04-14: qty 5

## 2024-04-14 NOTE — ED Provider Notes (Signed)
 Care was taken over from Dr. Inga Manges.  Patient had a prior intracranial hemorrhage and is essentially bedbound and not able to provide history.  She had some seizure-like activity today with some leftward gaze and facial twitching.  I discussed this with her daughter, Sarah Myers who does not know of any known history of seizures.  On chart review, she was seen last fall for possible seizure activity but the daughter does not know of any diagnosed history of seizures and says that she does not currently see a neurologist.  It was felt that the Depakote  that she is on is more likely for mood stabilization rather than antiepileptic medication.  She has not had any ongoing visible seizure activity.  Head CT does not show any acute abnormality.  Discussed with Dr. Lindzen who felt the patient need to come in for EEG monitoring.  He recommended loading the patient with Depakote  at 20 mg/kg and then after 8 hours can start 250 mg 3 times daily.  Discussed with Dr. [facility] who will admit the patient for further treatment.   Sarah Los, MD 04/14/24 [phone]

## 2024-04-14 NOTE — ED Triage Notes (Signed)
 Patient brought in by [facility] from [facility] for seizure like activity. Seizure x 20 minutes per facility, no hx of same-no meds for seizures. Full left side deficits, left arm contracted from previous stroke. Patient endorses possible hx of seizures but hasn't "had one in a long time." Patient had nystagmus and rhythmic movements of her mouth, 2.5 mg IV versed  given by EMS, given 1mg  of ativan  IM at facility. Patient takes amantadine  for anti-parkinsons. Altered at baseline- bed bound.  20 R hand.  CBG 148 HR 95 95 on 3 liters- room air

## 2024-04-14 NOTE — Progress Notes (Signed)
 LTM EEG hooked up and running - no initial skin breakdown - push button tested - [facility] monitoring.

## 2024-04-14 NOTE — ED Notes (Signed)
 The patient's daughter, Sue-Anne Vankley ([phone]), would like an update on her mother condition and plan of care from the nurse. The nurse was sent a secure chat message with the daughter's contact information.

## 2024-04-14 NOTE — ED Provider Notes (Signed)
 [place] [facility]GENC[facility] DEPARTMENT AT [facility] Provider Note   CSN: [id number] Arrival date & time: 04/14/24  1324     History  Chief Complaint  Patient presents with   Seizures    Sarah Myers is a 53 y.o. female.  53 yo F with a chief complaints of seizure-like activity.  This was reported by her nursing facility.  Reportedly has a history of seizures but is not on any obvious antiepileptic medications on her MAR.  She is demented and bedbound at baseline.  Left-sided paralysis with contractures.  Was given Ativan  at her facility and then was given a bolus dose of Versed  improved here.  EMS with some left-sided gaze preference with some reported nystagmus and left-sided facial twitching.  Seemed to improved with Versed  administration.   Seizures      Home Medications Prior to Admission medications   Medication Sig Start Date End Date Taking? Authorizing Provider  acetaminophen  (TYLENOL ) 500 MG tablet Take 1,000 mg by mouth every 6 (six) hours as needed (pain).    [provider]  Amantadine  HCl 100 MG tablet Take 100 mg by mouth 2 (two) times daily. 09/15/22   [provider]  amLODipine  (NORVASC ) 10 MG tablet Take 1 tablet (10 mg total) by mouth daily. 06/22/22   Raulkar, Keven Pel, MD  apixaban  (ELIQUIS ) 5 MG TABS tablet Take 1 tablet (5 mg total) by mouth 2 (two) times daily. 06/22/22   Raulkar, Keven Pel, MD  Baclofen  15 MG TABS Take 15 mg by mouth 3 (three) times daily.    [provider]  buPROPion  (WELLBUTRIN ) 100 MG tablet Take 100 mg by mouth 3 (three) times daily.    [provider]  carvedilol  (COREG ) 6.25 MG tablet Take 1 tablet (6.25 mg total) by mouth 2 (two) times daily with a meal. 06/22/22   Raulkar, Keven Pel, MD  cholecalciferol  (VITAMIN D3) 25 MCG (1000 UNIT) tablet Take 1,000 Units by mouth daily.    [provider]  divalproex  (DEPAKOTE ) 125 MG DR tablet Take 125 mg by mouth daily.    [provider]  famotidine  (PEPCID ) 20 MG tablet Take 1 tablet (20 mg total) by mouth 2 (two) times daily. 05/22/22   Raulkar, Keven Pel, MD  gabapentin  (NEURONTIN ) 100 MG capsule Take 1 capsule (100 mg total) by mouth 2 (two) times daily. 05/24/22   Raulkar, Keven Pel, MD  hydrALAZINE  (APRESOLINE ) 25 MG tablet Take 25 mg by mouth every 6 (six) hours. 10/10/22   [provider]  hydrOXYzine  (VISTARIL ) 50 MG capsule Take 50 mg by mouth at bedtime.    [provider]  losartan  (COZAAR ) 25 MG tablet Take 1 tablet (25 mg total) by mouth daily. 06/09/22   Raulkar, Keven Pel, MD  magnesium  gluconate (MAGONATE) 500 MG tablet Take 1 tablet (500 mg total) by mouth at bedtime. 05/22/22   Raulkar, Keven Pel, MD  mirtazapine  (REMERON ) 7.5 MG tablet Take 7.5 mg by mouth at bedtime.    [provider]  Multiple Vitamin (MULTIVITAMIN WITH MINERALS) TABS tablet Take 1 tablet by mouth daily. 05/22/22   Raulkar, Keven Pel, MD  Nutritional Supplements (NUTRITIONAL SUPPLEMENT PO) Take 1 each by mouth 2 (two) times daily. Frozen nutritional treat    [provider]  ondansetron  (ZOFRAN ) 4 MG tablet Take 1 tablet (4 mg total) by mouth every 8 (eight) hours as needed for nausea or vomiting. 10/17/22   Tegeler, Marine Sia, MD  polyethylene glycol (MIRALAX  /  GLYCOLAX ) 17 g packet Take 17 g by mouth daily. 10/17/23   Ozell Blunt, MD  saccharomyces boulardii (FLORASTOR) 250 MG capsule Take 250 mg by mouth daily.    [provider]  tiZANidine  (ZANAFLEX ) 2 MG tablet Take 2 mg by mouth at bedtime.    [provider]      Allergies    Zestril  [lisinopril ]    Review of Systems   Review of Systems  Neurological:  Positive for seizures.    Physical Exam Updated Vital Signs BP 132/87   Pulse (!) 58   Temp (!) 97.5 F (36.4 C) (Oral)   Resp 18   Ht 5\' 5"  (1.651 m)   Wt 42 kg   SpO2 98%   BMI 15.41 kg/m  Physical Exam  ED Results / Procedures / Treatments    Labs (all labs ordered are listed, but only abnormal results are displayed) Labs Reviewed  AMMONIA - Abnormal; Notable for the following components:      Result Value   Ammonia 41 (*)    All other components within normal limits  COMPREHENSIVE METABOLIC PANEL WITH GFR - Abnormal; Notable for the following components:   Glucose, Bld 128 (*)    Albumin  3.4 (*)    All other components within normal limits  CBC WITH DIFFERENTIAL/PLATELET - Abnormal; Notable for the following components:   Platelets 114 (*)    Lymphs Abs 0.5 (*)    All other components within normal limits  CBG MONITORING, ED - Abnormal; Notable for the following components:   Glucose-Capillary 113 (*)    All other components within normal limits  I-STAT VENOUS BLOOD GAS, ED - Abnormal; Notable for the following components:   pCO2, Ven 40.9 (*)    pO2, Ven 114 (*)    Acid-Base Excess 3.0 (*)    All other components within normal limits  ETHANOL  HCG, SERUM, QUALITATIVE  RAPID URINE DRUG SCREEN, HOSP PERFORMED  URINALYSIS, ROUTINE W REFLEX MICROSCOPIC  VALPROIC ACID LEVEL  I-STAT CG4 LACTIC ACID, ED    EKG EKG Interpretation Date/Time:  Monday April 14 2024 13:42:00 EDT Ventricular Rate:  95 PR Interval:  171 QRS Duration:  112 QT Interval:  389 QTC Calculation: 489 R Axis:   113  Text Interpretation: Sinus rhythm Consider left ventricular hypertrophy Borderline T abnormalities, lateral leads Borderline prolonged QT interval No significant change since last tracing Confirmed by Albertus Hughs [phone]) on 04/14/2024 2:43:54 PM  Radiology DG Chest Port 1 View Result Date: 04/14/2024 CLINICAL DATA:  Altered mental status.  Seizure-like activity. EXAM: PORTABLE CHEST 1 VIEW COMPARISON:  Radiographs 02/09/2024 and 11/10/2023.  CT 10/17/2022. FINDINGS: 1415 hours. The heart size and mediastinal contours are normal. The lungs are clear. There is no pleural effusion or pneumothorax. No acute osseous findings are  identified. Telemetry leads overlie the chest. IMPRESSION: No evidence of acute cardiopulmonary process. Electronically Signed   By: Elmon Hagedorn M.D.   On: 04/14/2024 14:57    Procedures Procedures    Medications Ordered in ED Medications - No data to display  ED Course/ Medical Decision Making/ A&P                                 Medical Decision Making Amount and/or Complexity of Data Reviewed Labs: ordered. Radiology: ordered.   53 yo F with a chief complaint of possible seizure-like activity.  This was noted by her nursing  facility.  She was given a dose of Ativan  and EMS was called.  Per EMS had some left-sided facial twitching and some left-sided gaze deviation with some nystagmus and was given Versed  en route with some improvement.  On arrival here patient without any obvious twitching.  She is localizing to pain.  Patient frequently reassessed.  Continues to have improvement of her mental status.  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  Has confusion at baseline.  Lab work without significant electrolyte abnormalities.  No acute anemia.  Awaiting CT of the head.  Plain film of the chest independently interpreted by me without focal infiltrate.  Signed out to Dr. Nolia Baumgartner, please see their note for further details of care in the ED.  The patients results and plan were reviewed and discussed.   Any x-rays performed were independently reviewed by myself.   Differential diagnosis were considered with the presenting HPI.  Medications - No data to display  Vitals:   04/14/24 1338 04/14/24 1339 04/14/24 1430 04/14/24 1515  BP:  (!) 148/107 128/84 132/87  Pulse:  87 72 (!) 58  Resp:  19 20 18   Temp:  (!) 97.5 F (36.4 C)    TempSrc:  Oral    SpO2:  99% 99% 98%  Weight: 42 kg     Height: 5\' 5"  (1.651 m)       Final diagnoses:  Seizure-like activity [facility])            Final  Clinical Impression(s) / ED Diagnoses Final diagnoses:  Seizure-like activity [facility])    Rx / DC Orders ED Discharge Orders     None         Albertus Hughs, DO 04/14/24 1539

## 2024-04-14 NOTE — Progress Notes (Signed)
 Had to pause Overnight EEG to due to uncooperative behavior. Waiting on 2mg  of Versed .

## 2024-04-14 NOTE — Consult Note (Addendum)
 NEUROLOGY CONSULT NOTE   Date of service: April 14, 2024 Patient Name: Sarah Myers MRN:  [id number] DOB:  1971-07-24 Chief Complaint: "seizures" Requesting Provider: Angelene Kelly, MD  History of Present Illness  Sarah Myers is a 53 y.o. 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  Patient initially told the ED staff that she has had seizures in the past but had not had one in a long time.  Per nursing report, she is altered at baseline and is bedbound. On my encounter, she was sleeping in bed, took a couple of attempts of gently stroking her on the shoulder to wake her up, and her speech was garbled and she was unable to provide any clear history.  LKW: Unclear-prior to presentation Modified rankin score: 5-Severe disability-bedridden, incontinent, needs constant attention IV Thrombolysis: Unclear last known well, most likely seizure and not a new stroke EVT: Same as above  NIHSS components Score: Comment  1a Level of Conscious 0[]  1[x]  2[]  3[]      1b LOC Questions 0[]  1[]  2[x]       1c LOC Commands 0[]  1[]  2[x]       2 Best Gaze 0[x]  1[]  2[]       3 Visual 0[x]  1[]  2[]  3[]      4 Facial Palsy 0[]  1[x]  2[]  3[]      5a Motor Arm - left 0[]  1[]  2[]  3[x]  4[]  UN[]    5b Motor Arm - Right 0[x]  1[]  2[]  3[]  4[]  UN[]    6a Motor Leg - Left 0[]  1[]  2[]  3[x]  4[]  UN[]    6b Motor Leg - Right 0[x]  1[]  2[]  3[]  4[]  UN[]    7 Limb Ataxia 0[x]  1[]  2[]  UN[]      8 Sensory 0[]  1[x]  2[]  UN[]      9 Best Language 0[]  1[]  2[x]  3[]      10 Dysarthria 0[]  1[]  2[x]  UN[]      11 Extinct. and Inattention 0 1[]  2[]       TOTAL: 17      ROS  Unable to ascertain due to her mentation  Past History   Past Medical History:  Diagnosis Date   Hypertension    Stroke  [facility])     Past Surgical History:  Procedure Laterality Date   BREAST LUMPECTOMY  1992   CESAREAN SECTION     IR GASTROSTOMY TUBE MOD SED  03/06/2022   ORIF RADIAL FRACTURE  02/10/2012   Procedure: OPEN REDUCTION INTERNAL FIXATION (ORIF) RADIAL FRACTURE;  Surgeon: Jasmine Mesi, MD;  Location: [facility] OR;  Service: Orthopedics;  Laterality: Right;   ORIF ULNAR FRACTURE  02/10/2012   Procedure: OPEN REDUCTION INTERNAL FIXATION (ORIF) ULNAR FRACTURE;  Surgeon: Jasmine Mesi, MD;  Location: [facility] OR;  Service: Orthopedics;  Laterality: Right;   TUBAL LIGATION      Family History: Family History  Problem Relation Age of Onset   Diabetes Mother    Hypertension Mother    Heart disease Mother    Diabetes Maternal Aunt    Stroke Maternal Uncle    Diabetes Maternal Grandmother    Heart disease Maternal Grandmother    Diabetes Maternal Grandfather    Heart disease Maternal Grandfather    Heart disease Paternal Grandmother    Heart disease Paternal Grandfather     Social History  reports  that she has never smoked. She has never used smokeless tobacco. She reports that she does not currently use alcohol. She reports that she does not use drugs.  Allergies  Allergen Reactions   Zestril  [Lisinopril ] Cough    Medications   Current Facility-Administered Medications:    sodium chloride  (PF) 0.9 % injection 3 mL, 3 mL, Other, PRN, , 3 mL at 08/23/23 0955   valproate (DEPACON) 800 mg in dextrose  5 % 50 mL IVPB, 800 mg, Intravenous, Once, Last Rate: 58 mL/hr at 04/14/24 1927, 800 mg at 04/14/24 1927 **FOLLOWED BY** [START ON 04/15/2024] valproate (DEPACON) 250 mg in dextrose  5 % 50 mL IVPB, 250 mg, Intravenous, Q8H, Belfi, Melanie, MD  Current Outpatient Medications:    acetaminophen  (TYLENOL ) 500 MG tablet, Take 1,000 mg by mouth every 6 (six) hours as needed (pain)., Disp: , Rfl:    Amantadine  HCl 100 MG tablet, Take 100 mg by mouth 2 (two) times daily., Disp: , Rfl:    amLODipine   (NORVASC ) 10 MG tablet, Take 1 tablet (10 mg total) by mouth daily., Disp: 90 tablet, Rfl: 3   apixaban  (ELIQUIS ) 5 MG TABS tablet, Take 1 tablet (5 mg total) by mouth 2 (two) times daily., Disp: 60 tablet, Rfl: 1   Baclofen  15 MG TABS, Take 15 mg by mouth 3 (three) times daily., Disp: , Rfl:    buPROPion  (WELLBUTRIN ) 100 MG tablet, Take 100 mg by mouth 3 (three) times daily., Disp: , Rfl:    carvedilol  (COREG ) 6.25 MG tablet, Take 1 tablet (6.25 mg total) by mouth 2 (two) times daily with a meal., Disp: 180 tablet, Rfl: 3   cholecalciferol  (VITAMIN D3) 25 MCG (1000 UNIT) tablet, Take 1,000 Units by mouth daily., Disp: , Rfl:    divalproex  (DEPAKOTE ) 125 MG DR tablet, Take 125 mg by mouth daily., Disp: , Rfl:    famotidine  (PEPCID ) 20 MG tablet, Take 1 tablet (20 mg total) by mouth 2 (two) times daily., Disp: 60 tablet, Rfl: 0   gabapentin  (NEURONTIN ) 100 MG capsule, Take 1 capsule (100 mg total) by mouth 2 (two) times daily., Disp: 60 capsule, Rfl: 0   hydrALAZINE  (APRESOLINE ) 25 MG tablet, Take 25 mg by mouth every 6 (six) hours., Disp: , Rfl:    hydrOXYzine  (VISTARIL ) 50 MG capsule, Take 50 mg by mouth at bedtime., Disp: , Rfl:    losartan  (COZAAR ) 25 MG tablet, Take 1 tablet (25 mg total) by mouth daily., Disp: 90 tablet, Rfl: 3   magnesium  gluconate (MAGONATE) 500 MG tablet, Take 1 tablet (500 mg total) by mouth at bedtime., Disp: 30 tablet, Rfl: 0   mirtazapine  (REMERON ) 7.5 MG tablet, Take 7.5 mg by mouth at bedtime., Disp: , Rfl:    Multiple Vitamin (MULTIVITAMIN WITH MINERALS) TABS tablet, Take 1 tablet by mouth daily., Disp: 30 tablet, Rfl: 0   Nutritional Supplements (NUTRITIONAL SUPPLEMENT PO), Take 1 each by mouth 2 (two) times daily. Frozen nutritional treat, Disp: , Rfl:    ondansetron  (ZOFRAN ) 4 MG tablet, Take 1 tablet (4 mg total) by mouth every 8 (eight) hours as needed for nausea or vomiting., Disp: 12 tablet, Rfl: 0   polyethylene glycol (MIRALAX  / GLYCOLAX ) 17 g packet, Take 17  g by mouth daily., Disp: , Rfl:    saccharomyces boulardii (FLORASTOR) 250 MG capsule, Take 250 mg by mouth daily., Disp: , Rfl:    tiZANidine  (ZANAFLEX ) 2 MG tablet, Take 2 mg by mouth at bedtime., Disp: , Rfl:   Vitals  Vitals:   04/18/2024 1430 04/18/24 1515 04-18-2024 1545 04/18/2024 1730  BP: 128/84 132/87 (!) 151/91   Pulse: 72 (!) 58 77   Resp: 20 18 18    Temp:    98 F (36.7 C)  TempSrc:    Axillary  SpO2: 99% 98% 100%   Weight:      Height:        Body mass index is 15.41 kg/m.  Physical Exam  General: Thin cachectic looking woman comfortably sleeping in bed HEENT: Normocephalic atraumatic Lungs: Clear Cardiovascular: Regular rate rhythm Neurological exam Sleepy, awakens to voice after couple of taps on her shoulder. Incomprehensible words coming out of her mouth Unable to tell me her name, or where she is at.  Unable to name objects.  Unable to repeat. Cranial nerves II through XII: Pupils are equal round reactive to light, no clear gaze preference, blinks to threat inconsistently from both sides, left lower facial weakness at rest as evidenced by nasolabial fold flattening, tongue and palate midline. Motor examination with contractures in the left upper and lower extremity with decreased muscle mass with minimal movement to noxious stimulation.  Right side appears full strength Sensation diminished on the left as above Coordination difficult to assess given her mentation Gait testing deferred for safety  Labs/Imaging/Neurodiagnostic studies   CBC:  Recent Labs  Lab Apr 18, 2024 1415 04-18-24 1425  WBC  --  7.9  NEUTROABS  --  7.2  HGB 13.9 13.7  HCT 41.0 43.3  MCV  --  88.4  PLT  --  114*   Basic Metabolic Panel:  Lab Results  Component Value Date   NA 138 Apr 18, 2024   K 4.8 Apr 18, 2024   CO2 22 04/18/24   GLUCOSE 128 (H) Apr 18, 2024   BUN 17 18-Apr-2024   CREATININE 0.74 Apr 18, 2024   CALCIUM 10.2 04/18/24   GFRNONAA >60 04-18-24   GFRAA 119  08/18/2020   Lipid Panel:  Lab Results  Component Value Date   LDLCALC 64 02/06/2022   HgbA1c:  Lab Results  Component Value Date   HGBA1C 5.6 02/06/2022   Urine Drug Screen:     Component Value Date/Time   LABOPIA NONE DETECTED 04/18/24 1540   COCAINSCRNUR NONE DETECTED Apr 18, 2024 1540   LABBENZ POSITIVE (A) 04/18/24 1540   AMPHETMU NONE DETECTED 04-18-2024 1540   THCU NONE DETECTED 2024-04-18 1540   LABBARB NONE DETECTED 04/18/2024 1540    Alcohol Level     Component Value Date/Time   ETH <15 18-Apr-2024 1426   INR  Lab Results  Component Value Date   INR 1.3 (H) 02/10/2024   APTT  Lab Results  Component Value Date   APTT 34 10/13/2023  Valproate level-19 Urinalysis unremarkable Chest x-ray with no evidence of acute cardiopulmonary process  CT Head without contrast(Personally reviewed): No acute findings.  Mild age-related atrophy and chronic microvascular ischemic changes.  Old left occipital infarct  ASSESSMENT   MANREET SARR is a 53 y.o. female with above past medical history presenting for a seizure that lasted about 20 minutes at the facility.  Prior history of 1 seizure-like episode.  On valproate-likely for behavioral reasons. Case discussed with the on-call neurologist who recommended valproate load and starting standing doses of valproate.  The issue is that she is also on Eliquis  for PE and Dilantin and Depakote  may not be good drugs due to interaction.  Will change to Keppra.  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 She has been given a load of Depakote .  I will load with Keppra 1000 mg x 1 and started on Keppra 500 twice daily. Seizure precautions MRI of the brain without contrast when able to Continue Eliquis  for her PE Neurology will follow Plan discussed with Dr. Nolia Baumgartner  and Dr. [facility] ______________________________________________________________________    Signed, Tona Francis, MD [facility]

## 2024-04-14 NOTE — H&P (Signed)
 History and Physical    [name] UJW:[id number] DOB: 1971-12-11 DOA: 04/14/2024  Patient coming from: Skilled nursing facility.  Chief Complaint: Seizure-like activity.  HPI: Sarah Myers is a 53 y.o. 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  As per the report patient was given Versed  by the EMS.  ED Course: In the ER patient appears postictal and confused not back to her baseline.  CT head is unremarkable basic metabolic panel CBC unremarkable and EKG shows normal sinus rhythm and patient is afebrile.  Neurology on-call was consulted patient was initially given Depakote  but since patient is on Eliquis  started on Keppra.  Admitted for seizure and postictal state.  Review of Systems: As per HPI, rest all negative.   Past Medical History:  Diagnosis Date   Hypertension    Stroke [facility])     Past Surgical History:  Procedure Laterality Date   BREAST LUMPECTOMY  1992   CESAREAN SECTION     IR GASTROSTOMY TUBE MOD SED  03/06/2022   ORIF RADIAL FRACTURE  02/10/2012   Procedure: OPEN REDUCTION INTERNAL FIXATION (ORIF) RADIAL FRACTURE;  Surgeon: Jasmine Mesi, MD;  Location: [facility] OR;  Service: Orthopedics;  Laterality: Right;   ORIF ULNAR FRACTURE  02/10/2012   Procedure: OPEN REDUCTION INTERNAL FIXATION (ORIF) ULNAR FRACTURE;  Surgeon: Jasmine Mesi, MD;  Location: [facility] OR;  Service: Orthopedics;  Laterality: Right;   TUBAL LIGATION       reports that she has never smoked. She has never used smokeless tobacco. She reports that she does not currently use alcohol. She reports that she does not use drugs.  Allergies  Allergen Reactions   Zestril  [Lisinopril ] Cough    Family History  Problem Relation Age of  Onset   Diabetes Mother    Hypertension Mother    Heart disease Mother    Diabetes Maternal Aunt    Stroke Maternal Uncle    Diabetes Maternal Grandmother    Heart disease Maternal Grandmother    Diabetes Maternal Grandfather    Heart disease Maternal Grandfather    Heart disease Paternal Grandmother    Heart disease Paternal Grandfather     Prior to Admission medications   Medication Sig Start Date End Date Taking? Authorizing Provider  acetaminophen  (TYLENOL ) 500 MG tablet Take 1,000 mg by mouth every 6 (six) hours as needed (pain).    [provider]  Amantadine  HCl 100 MG tablet Take 100 mg by mouth 2 (two) times daily. 09/15/22   [provider]  amLODipine  (NORVASC ) 10 MG tablet Take 1 tablet (10 mg total) by mouth daily. 06/22/22   Raulkar, Keven Pel, MD  apixaban  (ELIQUIS ) 5 MG TABS tablet Take 1 tablet (5 mg total) by mouth 2 (two) times daily. 06/22/22   Raulkar, Keven Pel, MD  Baclofen  15 MG TABS Take 15 mg by mouth 3 (three) times daily.    [provider]  buPROPion  (WELLBUTRIN ) 100 MG tablet Take 100 mg by mouth 3 (three) times daily.    [provider]  carvedilol  (COREG ) 6.25 MG tablet Take 1 tablet (6.25 mg total) by mouth 2 (two) times daily with a meal. 06/22/22   Raulkar, Keven Pel, MD  cholecalciferol  (VITAMIN D3) 25 MCG (1000 UNIT) tablet  Take 1,000 Units by mouth daily.    [provider]  divalproex  (DEPAKOTE ) 125 MG DR tablet Take 125 mg by mouth daily.    [provider]  famotidine  (PEPCID ) 20 MG tablet Take 1 tablet (20 mg total) by mouth 2 (two) times daily. 05/22/22   Raulkar, Keven Pel, MD  gabapentin  (NEURONTIN ) 100 MG capsule Take 1 capsule (100 mg total) by mouth 2 (two) times daily. 05/24/22   Raulkar, Keven Pel, MD  hydrALAZINE  (APRESOLINE ) 25 MG tablet Take 25 mg by mouth every 6 (six) hours. 10/10/22   [provider]  hydrOXYzine  (VISTARIL ) 50 MG capsule Take 50 mg by mouth at bedtime.    [provider]  losartan  (COZAAR ) 25 MG tablet Take 1 tablet (25 mg total) by mouth daily. 06/09/22   Raulkar, Keven Pel, MD  magnesium  gluconate (MAGONATE) 500 MG tablet Take 1 tablet (500 mg total) by mouth at bedtime. 05/22/22   Raulkar, Keven Pel, MD  mirtazapine  (REMERON ) 7.5 MG tablet Take 7.5 mg by mouth at bedtime.    [provider]  Multiple Vitamin (MULTIVITAMIN WITH MINERALS) TABS tablet Take 1 tablet by mouth daily. 05/22/22   Raulkar, Keven Pel, MD  Nutritional Supplements (NUTRITIONAL SUPPLEMENT PO) Take 1 each by mouth 2 (two) times daily. Frozen nutritional treat    [provider]  ondansetron  (ZOFRAN ) 4 MG tablet Take 1 tablet (4 mg total) by mouth every 8 (eight) hours as needed for nausea or vomiting. 10/17/22   Tegeler, Marine Sia, MD  polyethylene glycol (MIRALAX  / GLYCOLAX ) 17 g packet Take 17 g by mouth daily. 10/17/23   Ozell Blunt, MD  saccharomyces boulardii (FLORASTOR) 250 MG capsule Take 250 mg by mouth daily.    [provider]  tiZANidine  (ZANAFLEX ) 2 MG tablet Take 2 mg by mouth at bedtime.    [provider]    Physical Exam: Constitutional: Moderately built and nourished. Vitals:   04/14/24 1515 04/14/24 1545 04/14/24 1730 04/14/24 1945  BP: 132/87 (!) 151/91  (!) 174/95  Pulse: (!) 58 77  74  Resp: 18 18  17   Temp:   98 F (36.7 C) 98.3 F (36.8 C)  TempSrc:   Axillary Oral  SpO2: 98% 100%  98%  Weight:      Height:       Eyes: Anicteric no pallor. ENMT: No discharge from the ears eyes nose, mouth. Neck: No mass palpated on neck rigidity. Respiratory: No rhonchi or crepitations. Cardiovascular: S1-S2 heard. Abdomen: Soft nontender bowel sound present. Musculoskeletal: Has contractures of the left lower extremity. Skin: No rash. Neurologic: Patient is confused not following commands.  Has left-sided upper extremity contracture. Psychiatric: Appears confused.   Labs on Admission: I have personally reviewed  following labs and imaging studies  CBC: Recent Labs  Lab 04/14/24 1415 04/14/24 1425  WBC  --  7.9  NEUTROABS  --  7.2  HGB 13.9 13.7  HCT 41.0 43.3  MCV  --  88.4  PLT  --  114*   Basic Metabolic Panel: Recent Labs  Lab 04/14/24 1415 04/14/24 1425  NA 139 138  K 4.7 4.8  CL  --  106  CO2  --  22  GLUCOSE  --  128*  BUN  --  17  CREATININE  --  0.74  CALCIUM  --  10.2   GFR: Estimated Creatinine Clearance: 55.2 mL/min (by C-G formula based on SCr of 0.74 mg/dL). Liver Function Tests: Recent Labs  Lab 04/14/24 1425  AST 23  ALT 12  ALKPHOS 57  BILITOT 0.6  PROT 7.1  ALBUMIN  3.4*   No results for input(s): "LIPASE", "AMYLASE" in the last 168 hours. Recent Labs  Lab 04/14/24 1407  AMMONIA 41*   Coagulation Profile: No results for input(s): "INR", "PROTIME" in the last 168 hours. Cardiac Enzymes: No results for input(s): "CKTOTAL", "CKMB", "CKMBINDEX", "TROPONINI" in the last 168 hours. BNP (last 3 results) No results for input(s): "PROBNP" in the last 8760 hours. HbA1C: No results for input(s): "HGBA1C" in the last 72 hours. CBG: Recent Labs  Lab 04/14/24 1421  GLUCAP 113*   Lipid Profile: No results for input(s): "CHOL", "HDL", "LDLCALC", "TRIG", "CHOLHDL", "LDLDIRECT" in the last 72 hours. Thyroid  Function Tests: No results for input(s): "TSH", "T4TOTAL", "FREET4", "T3FREE", "THYROIDAB" in the last 72 hours. Anemia Panel: No results for input(s): "VITAMINB12", "FOLATE", "FERRITIN", "TIBC", "IRON", "RETICCTPCT" in the last 72 hours. Urine analysis:    Component Value Date/Time   COLORURINE YELLOW 04/14/2024 1540   APPEARANCEUR CLEAR 04/14/2024 1540   LABSPEC 1.018 04/14/2024 1540   PHURINE 6.0 04/14/2024 1540   GLUCOSEU NEGATIVE 04/14/2024 1540   HGBUR NEGATIVE 04/14/2024 1540   BILIRUBINUR NEGATIVE 04/14/2024 1540   KETONESUR NEGATIVE 04/14/2024 1540   PROTEINUR 30 (A) 04/14/2024 1540   UROBILINOGEN 1.0 09/20/2015 1448   NITRITE NEGATIVE  04/14/2024 1540   LEUKOCYTESUR NEGATIVE 04/14/2024 1540   Sepsis Labs: @LABRCNTIP (procalcitonin:4,lacticidven:4) )No results found for this or any previous visit (from the past 240 hours).   Radiological Exams on Admission: CT HEAD WO CONTRAST Result Date: 04/14/2024 CLINICAL DATA:  Altered mental status. EXAM: CT HEAD WITHOUT CONTRAST TECHNIQUE: Contiguous axial images were obtained from the base of the skull through the vertex without intravenous contrast. RADIATION DOSE REDUCTION: This exam was performed according to the departmental dose-optimization program which includes automated exposure control, adjustment of the mA and/or kV according to patient size and/or use of iterative reconstruction technique. COMPARISON:  Head CT dated 02/12/2024. FINDINGS: Brain: Mild age-related atrophy and chronic microvascular ischemic changes. Left occipital old infarct. There is no acute intracranial hemorrhage. No mass effect or midline shift no extra-axial fluid collection. Vascular: No hyperdense vessel or unexpected calcification. Skull: Normal. Negative for fracture or focal lesion. Sinuses/Orbits: No acute findings. 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 Other: None. IMPRESSION: 1. No acute intracranial pathology. 2. Mild age-related atrophy and chronic microvascular ischemic changes. Left occipital old infarct. Electronically Signed   By: Angus Bark M.D.   On: 04/14/2024 16:29   DG Chest Port 1 View Result Date: 04/14/2024 CLINICAL DATA:  Altered mental status.  Seizure-like activity. EXAM: PORTABLE CHEST 1 VIEW COMPARISON:  Radiographs 02/09/2024 and 11/10/2023.  CT 10/17/2022. FINDINGS: 1415 hours. The heart size and mediastinal contours are normal. The lungs are clear. There is no pleural effusion or pneumothorax. No acute osseous findings are identified. Telemetry leads overlie the chest. IMPRESSION: No evidence of acute cardiopulmonary process. Electronically Signed   By: Elmon Hagedorn M.D.    On: 04/14/2024 14:57    EKG: Independently reviewed.  Normal sinus rhythm.  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  Appreciate  neurology consult.  Discussed with neurologist.  At this time since patient is on Eliquis  patient is being placed on Keppra loading dose with IV Keppra until patient can take orally.  Continuous EEG has been ordered by neurologist.  Seizure precautions.  Patient appears postictal and confused will get speech therapy evaluation until then patient will be NPO. History of pulmonary embolism on Eliquis  since patient is not back to her baseline still appearing confused and concerning for issues with swallow we will keep patient on Lovenox  PE dose until patient can take Eliquis . Hypertension takes losartan  amlodipine  hydralazine  and Coreg  for now since patient cannot reliably take orally Patient on as needed IV hydralazine . Prior history of stroke and intracranial hemorrhage.  Has contractures takes baclofen  and gabapentin  and amantadine . Mood disorder takes Depakote  Wellbutrin .  Since patient has new onset seizures will need close monitoring and further workup and more than 2 midnight stay.   DVT prophylaxis: Lovenox  full dose. Code Status: Full code. Family Communication: Will try to reach family. Disposition Plan: Monitored bed. Consults called: Neurologist. Admission status: Observation.

## 2024-04-15 ENCOUNTER — Encounter (HOSPITAL_COMMUNITY)

## 2024-04-15 ENCOUNTER — Observation Stay (HOSPITAL_COMMUNITY)

## 2024-04-15 ENCOUNTER — Inpatient Hospital Stay (HOSPITAL_COMMUNITY)

## 2024-04-15 DIAGNOSIS — Z7901 Long term (current) use of anticoagulants: Secondary | ICD-10-CM | POA: Diagnosis not present

## 2024-04-15 DIAGNOSIS — R636 Underweight: Secondary | ICD-10-CM | POA: Diagnosis present

## 2024-04-15 DIAGNOSIS — D509 Iron deficiency anemia, unspecified: Secondary | ICD-10-CM | POA: Diagnosis present

## 2024-04-15 DIAGNOSIS — Z833 Family history of diabetes mellitus: Secondary | ICD-10-CM | POA: Diagnosis not present

## 2024-04-15 DIAGNOSIS — R1312 Dysphagia, oropharyngeal phase: Secondary | ICD-10-CM | POA: Diagnosis not present

## 2024-04-15 DIAGNOSIS — F0393 Unspecified dementia, unspecified severity, with mood disturbance: Secondary | ICD-10-CM | POA: Diagnosis present

## 2024-04-15 DIAGNOSIS — I69398 Other sequelae of cerebral infarction: Secondary | ICD-10-CM | POA: Diagnosis not present

## 2024-04-15 DIAGNOSIS — Z681 Body mass index (BMI) 19 or less, adult: Secondary | ICD-10-CM | POA: Diagnosis not present

## 2024-04-15 DIAGNOSIS — Z8249 Family history of ischemic heart disease and other diseases of the circulatory system: Secondary | ICD-10-CM | POA: Diagnosis not present

## 2024-04-15 DIAGNOSIS — Z8673 Personal history of transient ischemic attack (TIA), and cerebral infarction without residual deficits: Secondary | ICD-10-CM

## 2024-04-15 DIAGNOSIS — Z79899 Other long term (current) drug therapy: Secondary | ICD-10-CM | POA: Diagnosis not present

## 2024-04-15 DIAGNOSIS — I10 Essential (primary) hypertension: Secondary | ICD-10-CM | POA: Diagnosis present

## 2024-04-15 DIAGNOSIS — G40909 Epilepsy, unspecified, not intractable, without status epilepticus: Secondary | ICD-10-CM | POA: Diagnosis present

## 2024-04-15 DIAGNOSIS — Z888 Allergy status to other drugs, medicaments and biological substances status: Secondary | ICD-10-CM | POA: Diagnosis not present

## 2024-04-15 DIAGNOSIS — I69354 Hemiplegia and hemiparesis following cerebral infarction affecting left non-dominant side: Secondary | ICD-10-CM | POA: Diagnosis not present

## 2024-04-15 DIAGNOSIS — I2699 Other pulmonary embolism without acute cor pulmonale: Secondary | ICD-10-CM | POA: Diagnosis not present

## 2024-04-15 DIAGNOSIS — F0394 Unspecified dementia, unspecified severity, with anxiety: Secondary | ICD-10-CM | POA: Diagnosis present

## 2024-04-15 DIAGNOSIS — Z7401 Bed confinement status: Secondary | ICD-10-CM | POA: Diagnosis not present

## 2024-04-15 DIAGNOSIS — D696 Thrombocytopenia, unspecified: Secondary | ICD-10-CM | POA: Diagnosis present

## 2024-04-15 DIAGNOSIS — K5900 Constipation, unspecified: Secondary | ICD-10-CM | POA: Diagnosis present

## 2024-04-15 DIAGNOSIS — Z86711 Personal history of pulmonary embolism: Secondary | ICD-10-CM | POA: Diagnosis not present

## 2024-04-15 DIAGNOSIS — Z823 Family history of stroke: Secondary | ICD-10-CM | POA: Diagnosis not present

## 2024-04-15 DIAGNOSIS — K5901 Slow transit constipation: Secondary | ICD-10-CM | POA: Diagnosis not present

## 2024-04-15 DIAGNOSIS — R569 Unspecified convulsions: Secondary | ICD-10-CM | POA: Diagnosis present

## 2024-04-15 LAB — GLUCOSE, CAPILLARY
Glucose-Capillary: 62 mg/dL — ABNORMAL LOW (ref 70–99)
Glucose-Capillary: 99 mg/dL (ref 70–99)
Glucose-Capillary: 97 mg/dL (ref 70–99)
Glucose-Capillary: 80 mg/dL (ref 70–99)

## 2024-04-15 LAB — CBC WITH DIFFERENTIAL/PLATELET
Abs Immature Granulocytes: 0.02 10*3/uL (ref 0.00–0.07)
Basophils Absolute: 0 10*3/uL (ref 0.0–0.1)
Basophils Relative: 0 %
Eosinophils Absolute: 0 10*3/uL (ref 0.0–0.5)
Eosinophils Relative: 0 %
HCT: 43 % (ref 36.0–46.0)
Hemoglobin: 14.2 g/dL (ref 12.0–15.0)
Immature Granulocytes: 0 %
Lymphocytes Relative: 23 %
Lymphs Abs: 1.6 10*3/uL (ref 0.7–4.0)
MCH: 28.2 pg (ref 26.0–34.0)
MCHC: 33 g/dL (ref 30.0–36.0)
MCV: 85.5 fL (ref 80.0–100.0)
Monocytes Absolute: 0.3 10*3/uL (ref 0.1–1.0)
Monocytes Relative: 4 %
Neutro Abs: 4.9 10*3/uL (ref 1.7–7.7)
Neutrophils Relative %: 73 %
Platelets: 114 10*3/uL — ABNORMAL LOW (ref 150–400)
RBC: 5.03 MIL/uL (ref 3.87–5.11)
RDW: 13.1 % (ref 11.5–15.5)
WBC: 6.8 10*3/uL (ref 4.0–10.5)
nRBC: 0 % (ref 0.0–0.2)

## 2024-04-15 LAB — COMPREHENSIVE METABOLIC PANEL WITH GFR
ALT: 16 U/L (ref 0–44)
AST: 19 U/L (ref 15–41)
Albumin: 3.5 g/dL (ref 3.5–5.0)
Alkaline Phosphatase: 65 U/L (ref 38–126)
Anion gap: 9 (ref 5–15)
BUN: 12 mg/dL (ref 6–20)
CO2: 23 mmol/L (ref 22–32)
Calcium: 10.5 mg/dL — ABNORMAL HIGH (ref 8.9–10.3)
Chloride: 105 mmol/L (ref 98–111)
Creatinine, Ser: 0.64 mg/dL (ref 0.44–1.00)
GFR, Estimated: >60 mL/min (ref >60)
Glucose, Bld: 78 mg/dL (ref 70–99)
Potassium: 4.2 mmol/L (ref 3.5–5.1)
Sodium: 137 mmol/L (ref 135–145)
Total Bilirubin: 0.7 mg/dL (ref 0.0–1.2)
Total Protein: 7.5 g/dL (ref 6.5–8.1)

## 2024-04-15 LAB — MAGNESIUM: Magnesium: 2 mg/dL (ref 1.7–2.4)

## 2024-04-15 MED ORDER — LORAZEPAM 2 MG/ML IJ SOLN
1.0000 mg | Freq: Once | INTRAMUSCULAR | Status: AC
  Administered 2024-04-15: 1 mg via INTRAVENOUS
  Filled 2024-04-15: qty 1

## 2024-04-15 MED ORDER — LORAZEPAM 2 MG/ML IJ SOLN
0.5000 mg | Freq: Once | INTRAMUSCULAR | Status: AC | PRN
  Administered 2024-04-15: 0.5 mg via INTRAVENOUS
  Filled 2024-04-15: qty 1

## 2024-04-15 MED ORDER — SENNOSIDES-DOCUSATE SODIUM 8.6-50 MG PO TABS
2.0000 | ORAL_TABLET | Freq: Two times a day (BID) | ORAL | Status: DC
Start: 2024-04-15 — End: 2024-04-17
  Administered 2024-04-15: 2 via ORAL
  Filled 2024-04-15 (×3): qty 2

## 2024-04-15 MED ORDER — POLYETHYLENE GLYCOL 3350 17 G PO PACK
17.0000 g | PACK | Freq: Every day | ORAL | Status: DC
  Administered 2024-04-15: 17 g via ORAL
  Filled 2024-04-15 (×3): qty 1

## 2024-04-15 NOTE — Progress Notes (Signed)
 IVT consult placed for PIV. This RN to bedside. Attempted x2, pt uncooperative, pulling arm away and pushing at this RN's hands. Redirection unsuccessful. Primary RN aware, no IV at this time and to only re-consult when pt is agreeable.

## 2024-04-15 NOTE — Procedures (Signed)
 Patient Name: Sarah Myers  MRN: [id number]  Epilepsy Attending: Arleene Lack  Referring Physician/Provider: Tona Francis, MD  Duration: 04/14/2024 2145 to 04/15/2024 2350  Patient history:  53 y.o. female with above past medical history presenting for a seizure that lasted about 20 minutes at the facility.  Prior history of 1 seizure-like episode. EEG to evaluate for seizure  Level of alertness: Awake, asleep  AEDs during EEG study: LEV  Technical aspects: This EEG study was done with scalp electrodes positioned according to the 10-20 International system of electrode placement. Electrical activity was reviewed with band pass filter of 1-70Hz , sensitivity of 7 uV/mm, display speed of 77mm/sec with a 60Hz  notched filter applied as appropriate. EEG data were recorded continuously and digitally stored.  Video monitoring was available and reviewed as appropriate.  Description: The posterior dominant rhythm consists of 8-9 Hz activity of moderate voltage (25-35 uV) seen predominantly in posterior head regions, symmetric and reactive to eye opening and eye closing. Sleep was characterized by vertex waves, sleep spindles (12 to 14 Hz), maximal frontocentral region. Hyperventilation and photic stimulation were not performed.     Two seizures without clinical signs were noted on 04/15/2024 at 0513 and 0724. During seizure EEG showed sharp waves in left temporal region followed by rhythmic 5 to 6 Hz theta-delta slowing which then involve all of left frontotemporal region. EEG then evolved into 2 to 3 Hz high amplitude sharply contoured delta slowing and involved left parieto-occipital region. Eventually EEG also involved right hemisphere. Around this time patient woke up but again no clinical signs were noted. Total duration of seizure was about 1 minute 10 seconds and 1 minute respectively.  EEG was disconnected between 1312 to 1516 for mri brain  ABNORMALITY - Seizure without clinical signs,  left temporal region  IMPRESSION: This study showed two seizures without clinical signs arising from left temporal region on/ 04/15/2024 at 0513 and 0724, lasting for about 1 minute 10 seconds and 1 minute respectively.  Deldrick Linch O Karsten Howry

## 2024-04-15 NOTE — Evaluation (Signed)
 Clinical/Bedside Swallow Evaluation Patient Details  Name: Sarah Myers MRN: [id number] Date of Birth: Dec 24, 1970  Today's Date: 04/15/2024 Time: SLP Start Time (ACUTE ONLY): 0900 SLP Stop Time (ACUTE ONLY): 0920 SLP Time Calculation (min) (ACUTE ONLY): 20 min  Past Medical History:  Past Medical History:  Diagnosis Date   Hypertension    Stroke [facility])    Past Surgical History:  Past Surgical History:  Procedure Laterality Date   BREAST LUMPECTOMY  1992   CESAREAN SECTION     IR GASTROSTOMY TUBE MOD SED  03/06/2022   ORIF RADIAL FRACTURE  02/10/2012   Procedure: OPEN REDUCTION INTERNAL FIXATION (ORIF) RADIAL FRACTURE;  Surgeon: Jasmine Mesi, MD;  Location: [facility] OR;  Service: Orthopedics;  Laterality: Right;   ORIF ULNAR FRACTURE  02/10/2012   Procedure: OPEN REDUCTION INTERNAL FIXATION (ORIF) ULNAR FRACTURE;  Surgeon: Jasmine Mesi, MD;  Location: [facility] OR;  Service: Orthopedics;  Laterality: Right;   TUBAL LIGATION     HPI:  Patient is a 53 y.o. 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 She presented to the hospital on 04/14/24 after a witnessed seizure at SNF which lasted 20 minutes in the facility as well as on the way to ER. CT head unremarkable, basic metabolic panel and CBC unremarkable, EKG showed normal sinus rhythm. She was admitted for seizure and postictal state. She is NPO awaiting SLP swallow evaluation.    Assessment / Plan / Recommendation  Clinical Impression  Patient presents with clinical s/s of what appears to be a cognitive based dysphagia as per this BSE. She was easily roused by voice and light touch and was receptive to PO's, stating she wanted "sausage, scrambled eggs". SLP assessed her swallow via thin liquids and puree solids. Patient able to hold cup and give self sips of orange juice, exhibiting a suspected delayed initiation of swallow but no overt s/s  aspiration. With puree solids,, she was able to bring spoon to mouth but required assistance with scooping applesauce onto spoon. No observed oral holding or overt s/s aspiration with puree solids. She had difficulty drawing liquid through straw, likely due to observed reduced labial seal.  SLP recommending initiate PO diet of Dys 1 (puree) solids and thin liquids and will follow for toleration and ability to advance. SLP Visit Diagnosis: Dysphagia, unspecified (R13.10)    Aspiration Risk  Mild aspiration risk    Diet Recommendation Dysphagia 1 (Puree);Thin liquid    Liquid Administration via: Cup;Straw Medication Administration: Crushed with puree Supervision: Full supervision/cueing for compensatory strategies;Staff to assist with self feeding Compensations: Slow rate;Small sips/bites;Minimize environmental distractions Postural Changes: Seated upright at 90 degrees    Other  Recommendations Oral Care Recommendations: Oral care BID    Recommendations for follow up therapy are one component of a multi-disciplinary discharge planning process, led by the attending physician.  Recommendations may be updated based on patient status, additional functional criteria and insurance authorization.  Follow up Recommendations Skilled nursing-short term rehab (<3 hours/day)      Assistance Recommended at Discharge    Functional Status Assessment Patient has had a recent decline in their functional status and demonstrates the ability to make significant improvements in function in a reasonable and predictable amount of time.  Frequency and Duration min 2x/week  1 week       Prognosis Prognosis for improved oropharyngeal function: Good Barriers to Reach Goals: Cognitive deficits      Swallow  Study   General Date of Onset: 04/14/24 HPI: Patient is a 53 y.o. 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 She presented to the hospital on 04/14/24 after a witnessed seizure at SNF which lasted 20 minutes in the facility as well as on the way to ER. CT head unremarkable, basic metabolic panel and CBC unremarkable, EKG showed normal sinus rhythm. She was admitted for seizure and postictal state. She is NPO awaiting SLP swallow evaluation. Type of Study: Bedside Swallow Evaluation Previous Swallow Assessment: during previous admission July 2023 Diet Prior to this Study: NPO Temperature Spikes Noted: No Respiratory Status: Room air History of Recent Intubation: No Behavior/Cognition: Lethargic/Drowsy;Cooperative;Pleasant mood;Alert;Requires cueing Oral Cavity Assessment: Within Functional Limits Oral Care Completed by SLP: Recent completion by staff Oral Cavity - Dentition: Adequate natural dentition Vision: Functional for self-feeding Self-Feeding Abilities: Able to feed self;Needs set up;Needs assist Patient Positioning: Upright in bed Baseline Vocal Quality: Low vocal intensity Volitional Cough: Cognitively unable to elicit Volitional Swallow: Unable to elicit    Oral/Motor/Sensory Function Overall Oral Motor/Sensory Function: Generalized oral weakness Facial ROM: Within Functional Limits Facial Symmetry: Within Functional Limits Facial Strength: Reduced right;Reduced left   Ice Chips     Thin Liquid Thin Liquid: Impaired Presentation: Cup Oral Phase Impairments: Reduced labial seal Pharyngeal  Phase Impairments: Suspected delayed Swallow    Nectar Thick     Honey Thick     Puree Puree: Impaired Presentation: Spoon Oral Phase Impairments: Reduced labial seal   Solid     Solid: Not tested     Jacqualine Mater, MA, CCC-SLP Speech Therapy

## 2024-04-15 NOTE — TOC Progression Note (Signed)
 Transition of Care [facility]) - Progression Note    Patient Details  Name: Sarah Myers MRN: [id number] Date of Birth: 12-Oct-1971  Transition of Care [facility]) CM/SW Contact  Katrinka Parr, [place] Phone Number: 04/15/2024, 1:49 PM  Clinical Narrative:     CSW notified by [facility] rep that pt is a LTC resident there.   Expected Discharge Plan: Skilled Nursing Facility               Social Determinants of Health (SDOH) Interventions SDOH Screenings   Depression [phone]): Low Risk  (08/23/2023)  Tobacco Use: Low Risk  (04/14/2024)    Readmission Risk Interventions    07/05/2022   11:29 AM 06/30/2022   12:52 PM 03/09/2022    9:54 AM  Readmission Risk Prevention Plan  Transportation Screening Complete Complete Complete  PCP or Specialist Appt within 5-7 Days  Complete   PCP or Specialist Appt within 3-5 Days Complete  Complete  Home Care Screening  Complete   Medication Review (RN CM)  Complete   HRI or Home Care Consult Complete  Not Complete  Social Work Consult for Recovery Care Planning/Counseling Complete  Complete  Palliative Care Screening Complete    Medication Review [employer]) Complete  Complete

## 2024-04-15 NOTE — Progress Notes (Signed)
 Patient transported to MRI . EEG disconnected  for transport.

## 2024-04-15 NOTE — [place] FL2 (Signed)
   MEDICAID FL2 LEVEL OF CARE FORM     IDENTIFICATION  Patient Name: Sarah Myers Birthdate: 1971/07/08 Sex: female Admission Date (Current Location): 04/14/2024  [facility] and [place] Number:  Producer, television/film/video and Address:  [facility], [address], [place], [place] [address]      Provider Number: [id number]  Attending Physician Name and Address:  Maylene Spear, MD  Relative Name and Phone Number:  Carlesia, Kable (Daughter)  [phone] [facility])    Current Level of Care: Hospital Recommended Level of Care:   Prior Approval Number:    Date Approved/Denied:   PASRR Number:    Discharge Plan: SNF    Current Diagnoses: Patient Active Problem List   Diagnosis Date Noted   Seizure (HCC) 04/14/2024   AMS (altered mental status) 10/13/2023   Constipation 10/13/2023   Hallucination, visual 12/12/2022   Acute cystitis 11/28/2022   Hypokalemia 11/28/2022   COVID-19 11/27/2022   Palliative care by specialist    Goals of care, counseling/discussion    Acute encephalopathy 06/28/2022   Bacteremia 06/28/2022   Metabolic acidosis 06/28/2022   Sepsis due to gram-negative UTI (HCC) 06/24/2022   Essential hypertension 06/24/2022   History of stroke 06/24/2022   History of pulmonary embolism 06/24/2022   Anxiety and depression 06/24/2022   Acute metabolic encephalopathy 06/24/2022   Altered mental status 06/23/2022   ICH (intracerebral hemorrhage) (HCC) 04/04/2022   Hypercalcemia 03/21/2022   Oral thrush 03/13/2022   Dysphagia 03/08/2022   Counseling regarding goals of care 03/08/2022   Tracheostomy dependent (HCC)    Bilateral pulmonary embolism (HCC)    AKI (acute kidney injury) (HCC)    Acute on chronic respiratory failure with hypoxia (HCC)    Pressure injury of skin 02/07/2022   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/05/2022   Iron deficiency anemia 06/30/2020   Vitamin D  deficiency 06/30/2020    GAD (generalized anxiety disorder) 05/18/2020   Malignant hypertension 10/30/2019    Orientation RESPIRATION BLADDER Height & Weight        Normal Incontinent Weight: 92 lb 9.5 oz (42 kg) Height:  5\' 5"  (165.1 cm)  BEHAVIORAL SYMPTOMS/MOOD NEUROLOGICAL BOWEL NUTRITION STATUS      Continent Diet (see d/c summary)  AMBULATORY STATUS COMMUNICATION OF NEEDS Skin   Extensive Assist Verbally Normal                       Personal Care Assistance Level of Assistance  Bathing, Dressing, Feeding Bathing Assistance: Maximum assistance Feeding assistance: Independent Dressing Assistance: Limited assistance     Functional Limitations Info  Sight, Hearing, Speech Sight Info: Adequate Hearing Info: Adequate Speech Info: Impaired    SPECIAL CARE FACTORS FREQUENCY                       Contractures Contractures Info: Not present    Additional Factors Info  Code Status, Allergies Code Status Info: full code Allergies Info: zestril  (lisinopril )           Current Medications (04/15/2024):  This is the current hospital active medication list Current Facility-Administered Medications  Medication Dose Route Frequency Provider Last Rate Last Admin   enoxaparin  (LOVENOX ) injection 40 mg  40 mg Subcutaneous Q12H Delorse Fey, RPH   40 mg at 04/15/24 1157   hydrALAZINE  (APRESOLINE ) injection 10 mg  10 mg Intravenous Q4H PRN Kakrakandy, Arshad N, MD   10 mg at 04/15/24 0530  levETIRAcetam (KEPPRA) IVPB 500 mg/100 mL premix  500 mg Intravenous Q12H Kakrakandy, Arshad N, MD 400 mL/hr at 04/15/24 0837 500 mg at 04/15/24 8295   LORazepam  (ATIVAN ) injection 1 mg  1 mg Intravenous Once Krishnan, Gokul, MD         Discharge Medications: Please see discharge summary for a list of discharge medications.  Relevant Imaging Results:  Relevant Lab Results:   Additional Information SS# [id number]  [facility], LCSW

## 2024-04-15 NOTE — Progress Notes (Signed)
 [facility] PROGRESS NOTE   Sarah Myers ZOX:[id number] DOB: 08/14/1971 DOA: 04/14/2024  PCP: Karron Pagan, MD  Brief History: 53 y.o. 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  As per the report patient was given Versed  by the EMS.  Patient was hospitalized for further management.  Consultants: Neurology  Procedures: Continuous EEG    Subjective/Interval History: Patient somewhat confused this morning.  Wants something to eat but does not answer questions appropriately.  Follows some commands but not others.    Assessment/Plan:  Seizure Patient was observed to have generalized tonic-clonic seizure per reports.  She lives in a nursing facility.  No previous history of seizures. Prior to admission patient noted to be on amantadine , Depakote , gabapentin , hydroxyzine , mirtazapine . Patient loaded with Keppra.  Noted to be just on Keppra currently. Swallow evaluation is pending before resuming diet and other medications. Neurology was consulted.  Patient is on continuous EEG. MRI is pending.  Previous history of right basal ganglia stroke with left-sided contractures and weakness MRI is pending.  CT head did not show any acute findings. Noted to have left-sided contractures.  History of pulmonary embolism On Eliquis  prior to admission which is currently changed over to Lovenox  still swallow function has been assessed.  Essential hypertension Noted to be on amlodipine , carvedilol , hydralazine , losartan  prior to admission.  Resume when able to take orally. Occasional elevated blood pressure readings noted.  Hydralazine  as needed.  Mood disorder On Depakote  and Wellbutrin  prior to  admission.   DVT Prophylaxis: On Lovenox  therapeutic dose Code Status: Full code Family Communication: No family at bedside Disposition Plan: Hopefully back to nursing facility when improved  Status is: Observation The patient will require care spanning > 2 midnights and should be moved to inpatient because: Seizure      Medications: Scheduled:  enoxaparin  (LOVENOX ) injection  40 mg Subcutaneous Q12H   Continuous:  levETIRAcetam 500 mg (04/15/24 0837)   PRN:hydrALAZINE   Antibiotics: Anti-infectives (From admission, onward)    None       Objective:  Vital Signs  Vitals:   04/15/24 0055 04/15/24 0433 04/15/24 0530 04/15/24 0757  BP: (!) 155/96 (!) 171/93 (!) 171/93 (!) 138/92  Pulse: 63 60  64  Resp: 17 18  17   Temp: 98.4 F (36.9 C) 98.4 F (36.9 C)  98.4 F (36.9 C)  TempSrc: Axillary Axillary  Oral  SpO2: 98% 98%  100%  Weight:      Height:        Intake/Output Summary (Last 24 hours) at 04/15/2024 0854 Last data filed at 04/14/2024 1951 Gross per 24 hour  Intake 21.9 ml  Output --  Net 21.9 ml   Filed Weights   04/14/24 1338  Weight: 42 kg    General appearance: Awake alert.  In no distress.  Distracted Resp: Clear to auscultation bilaterally.  Normal effort Cardio: S1-S2 is normal regular.  No S3-S4.  No rubs murmurs or bruit GI: Abdomen is soft.  Nontender nondistended.  Bowel sounds are present normal.  No masses organomegaly Extremities: Left-sided contractures noted   Lab Results:  Data Reviewed: I have personally reviewed following labs and reports of the imaging studies  CBC: Recent Labs  Lab 04/14/24 1415 04/14/24 1425 04/15/24 0531  WBC  --  7.9 6.8  NEUTROABS  --  7.2 4.9  HGB 13.9 13.7 14.2  HCT 41.0 43.3 43.0  MCV  --  88.4 85.5  PLT  --  114* 114*    Basic Metabolic Panel: Recent Labs  Lab 04/14/24 1415 04/14/24 1425 04/15/24 0531  NA 139 138 137  K 4.7 4.8 4.2  CL  --  106 105  CO2  --  22 23  GLUCOSE  --   128* 78  BUN  --  17 12  CREATININE  --  0.74 0.64  CALCIUM  --  10.2 10.5*  MG  --   --  2.0    GFR: Estimated Creatinine Clearance: 55.2 mL/min (by C-G formula based on SCr of 0.64 mg/dL).  Liver Function Tests: Recent Labs  Lab 04/14/24 1425 04/15/24 0531  AST 23 19  ALT 12 16  ALKPHOS 57 65  BILITOT 0.6 0.7  PROT 7.1 7.5  ALBUMIN  3.4* 3.5    Recent Labs  Lab 04/14/24 1407  AMMONIA 41*    CBG: Recent Labs  Lab 04/14/24 1421 04/15/24 0841  GLUCAP 113* 62*    Radiology Studies: CT HEAD WO CONTRAST Result Date: 04/14/2024 CLINICAL DATA:  Altered mental status. EXAM: CT HEAD WITHOUT CONTRAST TECHNIQUE: Contiguous axial images were obtained from the base of the skull through the vertex without intravenous contrast. RADIATION DOSE REDUCTION: This exam was performed according to the departmental dose-optimization program which includes automated exposure control, adjustment of the mA and/or kV according to patient size and/or use of iterative reconstruction technique. COMPARISON:  Head CT dated 02/12/2024. FINDINGS: Brain: Mild age-related atrophy and chronic microvascular ischemic changes. Left occipital old infarct. There is no acute intracranial hemorrhage. No mass effect or midline shift no extra-axial fluid collection. Vascular: No hyperdense vessel or unexpected calcification. Skull: Normal. Negative for fracture or focal lesion. Sinuses/Orbits: No acute findings. 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 Other: None. IMPRESSION: 1. No acute intracranial pathology. 2. Mild age-related atrophy and chronic microvascular ischemic changes. Left occipital old infarct. Electronically Signed   By: Angus Bark M.D.   On: 04/14/2024 16:29   DG Chest Port 1 View Result Date: 04/14/2024 CLINICAL DATA:  Altered mental status.  Seizure-like activity. EXAM: PORTABLE CHEST 1 VIEW COMPARISON:  Radiographs 02/09/2024 and 11/10/2023.  CT 10/17/2022. FINDINGS: 1415 hours. The heart  size and mediastinal contours are normal. The lungs are clear. There is no pleural effusion or pneumothorax. No acute osseous findings are identified. Telemetry leads overlie the chest. IMPRESSION: No evidence of acute cardiopulmonary process. Electronically Signed   By: Elmon Hagedorn M.D.   On: 04/14/2024 14:57       LOS: 0 days   Liesl Simons Lyndon Santiago  [facility] Pager on [email or web address]  04/15/2024, 8:54 AM

## 2024-04-15 NOTE — Progress Notes (Signed)
 Fp1, PZ A1 main - no skin breakdown noted at this time - assisted by CP

## 2024-04-15 NOTE — Progress Notes (Signed)
 Unable to complete admission questions due to patient not answering questions or following commands. Will attempt when patients daughter calls me back.

## 2024-04-15 NOTE — Progress Notes (Addendum)
 NEUROLOGY CONSULT FOLLOW UP NOTE   Date of service: April 15, 2024 Patient Name: Sarah Myers MRN:  [id number] DOB:  1971/10/18  Interval Hx/subjective   Patient lying in the bed in NAD. She is on LTM. LTM showed subclinical seizures x 2. She is awake and alert, she is oriented to self and place. No family a the bedside.  She is requesting something to eat.  Vitals   Vitals:   04/15/24 0055 04/15/24 0433 04/15/24 0530 04/15/24 0757  BP: (!) 155/96 (!) 171/93 (!) 171/93 (!) 138/92  Pulse: 63 60  64  Resp: 17 18  17   Temp: 98.4 F (36.9 C) 98.4 F (36.9 C)  98.4 F (36.9 C)  TempSrc: Axillary Axillary  Oral  SpO2: 98% 98%  100%  Weight:      Height:         Body mass index is 15.41 kg/m.  Physical Exam   Constitutional: well developed in NAD Psych: flat Eyes: No scleral injection.   HENT: No OP obstrucion.   Head: Normocephalic.   Cardiovascular: Normal rate and regular rhythm.   Respiratory: Effort normal, non-labored breathing.   GI: Soft.  No distension. There is no tenderness.   Skin: WDI.    Neurologic Examination   Mental Status -  She is awake and alert, oriented to self and place. Follows commands intermittently. Poor attention and concentration  Cranial Nerves II - XII - II - Visual field intact OU. III, IV, VI - Extraocular movements intact. V - Facial sensation intact bilaterally. VII - left facial droop VIII - Hearing & vestibular intact bilaterally. X - Palate elevates symmetrically. XI - Chin turning & shoulder shrug intact bilaterally. XII - Tongue protrusion intact.  Motor Strength - left arm and leg with contractures, right arm and right leg moves spontaneous and antigravity  Motor Tone - Muscle tone was assessed at the neck and appendages and was normal. Sensory - decreased on left to noxious stimuli  Coordination -unable to assess Gait and Station - deferred.  Medications  Current Facility-Administered Medications:    enoxaparin   (LOVENOX ) injection 40 mg, 40 mg, Subcutaneous, Q12H, Delorse Fey, RPH, 40 mg at 04/15/24 0100   hydrALAZINE  (APRESOLINE ) injection 10 mg, 10 mg, Intravenous, Q4H PRN, Angelene Kelly, MD, 10 mg at 04/15/24 0530   levETIRAcetam (KEPPRA) IVPB 500 mg/100 mL premix, 500 mg, Intravenous, Q12H, Angelene Kelly, MD, Last Rate: 400 mL/hr at 04/15/24 0837, 500 mg at 04/15/24 0837  Labs and Diagnostic Imaging   CBC:  Recent Labs  Lab 04/14/24 1425 04/15/24 0531  WBC 7.9 6.8  NEUTROABS 7.2 4.9  HGB 13.7 14.2  HCT 43.3 43.0  MCV 88.4 85.5  PLT 114* 114*    Basic Metabolic Panel:  Lab Results  Component Value Date   NA 137 04/15/2024   K 4.2 04/15/2024   CO2 23 04/15/2024   GLUCOSE 78 04/15/2024   BUN 12 04/15/2024   CREATININE 0.64 04/15/2024   CALCIUM 10.5 (H) 04/15/2024   GFRNONAA >60 04/15/2024   GFRAA 119 08/18/2020   Lipid Panel:  Lab Results  Component Value Date   LDLCALC 64 02/06/2022   HgbA1c:  Lab Results  Component Value Date   HGBA1C 5.6 02/06/2022   Urine Drug Screen:     Component Value Date/Time   LABOPIA NONE DETECTED 04/14/2024 1540   COCAINSCRNUR NONE DETECTED 04/14/2024 1540   LABBENZ POSITIVE (A) 04/14/2024 1540   AMPHETMU NONE DETECTED 04/14/2024 1540  THCU NONE DETECTED 04/14/2024 1540   LABBARB NONE DETECTED 04/14/2024 1540    Alcohol Level     Component Value Date/Time   ETH <15 04/14/2024 1426   INR  Lab Results  Component Value Date   INR 1.3 (H) 02/10/2024   APTT  Lab Results  Component Value Date   APTT 34 10/13/2023   AED levels: No results found for: "PHENYTOIN", "ZONISAMIDE", "LAMOTRIGINE", "LEVETIRACETA"  CT Head without contrast: No acute process   MRI Brain: Ordered   LTM EEG 4/29:  This study showed two seizures without clinical signs arising from left temporal region on/ 04/15/2024 at 0513 and 0724, lasting for about 1 minute 10 seconds and 1 minute respectively.   Assessment  Sarah Myers  is a 53 y.o. female  53 y.o. female with above past medical history including one prior seizure, presenting for a seizure that lasted about 20 minutes at the facility.  On valproate prior to admission - likely for behavioral reasons. Case discussed with the on-call neurologist who recommended valproate load and starting standing doses of valproate.  The issue is that she is also on Eliquis  for PE and Dilantin and Depakote  may not be good drugs due to interaction.  Depakote  changed to Keppra  - Exam today reveals the patient to be awake and alert, oriented to self and place. Follows commands intermittently. Poor attention and concentration. Left hemiplegia with contractures and left hemisensory loss.  - Imaging as above.  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 Marlynn Hinckley

## 2024-04-15 NOTE — Progress Notes (Addendum)
 Went in to troubleshoot equipment and RN was asking about MRI procedure. Showed RN how to unplug cables and pt began pulling leads off FP1 and FP2. Tech kept replacing them and pt kept pulling them off. RN notified that pt will need mitts to keep leads on scalp. [facility] is able to see study running now.

## 2024-04-15 NOTE — Progress Notes (Signed)
 PHARMACY - ANTICOAGULATION CONSULT NOTE  Pharmacy Consult for Lovenox  (while apixaban  is on hold) Indication: History of PE  Allergies  Allergen Reactions   Zestril  [Lisinopril ] Cough    Patient Measurements: Height: 5\' 5"  (165.1 cm) Weight: 42 kg (92 lb 9.5 oz) IBW/kg (Calculated) : 57 HEPARIN  DW (KG): 42  Vital Signs: Temp: 98.3 F (36.8 C) (04/28 1945) Temp Source: Oral (04/28 1945) BP: 174/95 (04/28 1945) Pulse Rate: 74 (04/28 1945)  Labs: Recent Labs    04/14/24 1415 04/14/24 1425  HGB 13.9 13.7  HCT 41.0 43.3  PLT  --  114*  CREATININE  --  0.74    Estimated Creatinine Clearance: 55.2 mL/min (by C-G formula based on SCr of 0.74 mg/dL).   Medical History: Past Medical History:  Diagnosis Date   Hypertension    Stroke [facility])    Assessment: 53 y/o F from nursing facility with seizures, on Apixaban  PTA for history of PE, holding Apixaban  and using Lovenox  for now, last Apixaban  dose was >12 hours ago, will start Lovenox  now. Above labs reviewed.   Goal of Therapy:  Monitor platelets by anticoagulation protocol: Yes   Plan:  Lovenox  1 mg/kg subcutaneous q12h Daily CBC Monitor for bleeding F/U transition back to Apixaban    Silvestre Drum, PharmD, BCPS Clinical Pharmacist Phone: [phone]

## 2024-04-15 NOTE — Progress Notes (Signed)
 Hypoglycemic Event  CBG: 62  Treatment: 4 oz juice/soda  Symptoms: None  Follow-up CBG: Time: 0941 CBG Result:99  Possible Reasons for Event: Inadequate meal intake  Comments/MD notified:Dr. Rip Cheese, Judene Noss

## 2024-04-16 ENCOUNTER — Encounter (HOSPITAL_COMMUNITY)

## 2024-04-16 DIAGNOSIS — K5901 Slow transit constipation: Secondary | ICD-10-CM

## 2024-04-16 DIAGNOSIS — D696 Thrombocytopenia, unspecified: Secondary | ICD-10-CM | POA: Diagnosis not present

## 2024-04-16 DIAGNOSIS — R569 Unspecified convulsions: Secondary | ICD-10-CM | POA: Diagnosis not present

## 2024-04-16 DIAGNOSIS — R1312 Dysphagia, oropharyngeal phase: Secondary | ICD-10-CM | POA: Diagnosis not present

## 2024-04-16 LAB — GLUCOSE, CAPILLARY
Glucose-Capillary: 66 mg/dL — ABNORMAL LOW (ref 70–99)
Glucose-Capillary: 214 mg/dL — ABNORMAL HIGH (ref 70–99)
Glucose-Capillary: 74 mg/dL (ref 70–99)
Glucose-Capillary: 113 mg/dL — ABNORMAL HIGH (ref 70–99)
Glucose-Capillary: 135 mg/dL — ABNORMAL HIGH (ref 70–99)

## 2024-04-16 LAB — BASIC METABOLIC PANEL WITH GFR
Anion gap: 12 (ref 5–15)
BUN: 11 mg/dL (ref 6–20)
CO2: 21 mmol/L — ABNORMAL LOW (ref 22–32)
Calcium: 10.4 mg/dL — ABNORMAL HIGH (ref 8.9–10.3)
Chloride: 106 mmol/L (ref 98–111)
Creatinine, Ser: 0.65 mg/dL (ref 0.44–1.00)
GFR, Estimated: >60 mL/min (ref >60)
Glucose, Bld: 70 mg/dL (ref 70–99)
Potassium: 3.8 mmol/L (ref 3.5–5.1)
Sodium: 139 mmol/L (ref 135–145)

## 2024-04-16 LAB — CBC
HCT: 36.4 % (ref 36.0–46.0)
Hemoglobin: 12 g/dL (ref 12.0–15.0)
MCH: 28.4 pg (ref 26.0–34.0)
MCHC: 33 g/dL (ref 30.0–36.0)
MCV: 86.1 fL (ref 80.0–100.0)
Platelets: 119 10*3/uL — ABNORMAL LOW (ref 150–400)
RBC: 4.23 MIL/uL (ref 3.87–5.11)
RDW: 13.2 % (ref 11.5–15.5)
WBC: 3.7 10*3/uL — ABNORMAL LOW (ref 4.0–10.5)
nRBC: 0 % (ref 0.0–0.2)

## 2024-04-16 MED ORDER — ACETAMINOPHEN 500 MG PO TABS: 1000.0000 mg | ORAL_TABLET | Freq: Four times a day (QID) | ORAL | Status: DC | PRN

## 2024-04-16 MED ORDER — ADULT MULTIVITAMIN W/MINERALS CH
1.0000 | ORAL_TABLET | Freq: Every day | ORAL | Status: DC
  Administered 2024-04-16 – 2024-04-17 (×2): 1 via ORAL
  Filled 2024-04-16 (×2): qty 1

## 2024-04-16 MED ORDER — APIXABAN 5 MG PO TABS
5.0000 mg | ORAL_TABLET | Freq: Two times a day (BID) | ORAL | Status: DC
  Administered 2024-04-16 – 2024-04-17 (×3): 5 mg via ORAL
  Filled 2024-04-16 (×3): qty 1

## 2024-04-16 MED ORDER — VITAMIN D 25 MCG (1000 UNIT) PO TABS
1000.0000 [IU] | ORAL_TABLET | Freq: Every day | ORAL | Status: DC
  Administered 2024-04-16 – 2024-04-17 (×2): 1000 [IU] via ORAL
  Filled 2024-04-16 (×2): qty 1

## 2024-04-16 MED ORDER — GABAPENTIN 100 MG PO CAPS
100.0000 mg | ORAL_CAPSULE | Freq: Two times a day (BID) | ORAL | Status: DC
  Administered 2024-04-16 – 2024-04-17 (×3): 100 mg via ORAL
  Filled 2024-04-16 (×3): qty 1

## 2024-04-16 MED ORDER — LOSARTAN POTASSIUM 25 MG PO TABS
25.0000 mg | ORAL_TABLET | Freq: Every day | ORAL | Status: DC
  Administered 2024-04-16 – 2024-04-17 (×2): 25 mg via ORAL
  Filled 2024-04-16 (×2): qty 1

## 2024-04-16 MED ORDER — HYDRALAZINE HCL 25 MG PO TABS: 25.0000 mg | ORAL_TABLET | Freq: Four times a day (QID) | ORAL | Status: DC | PRN

## 2024-04-16 MED ORDER — DEXTROSE 50 % IV SOLN
25.0000 g | Freq: Once | INTRAVENOUS | Status: AC
  Administered 2024-04-16: 25 g via INTRAVENOUS
  Filled 2024-04-16: qty 50

## 2024-04-16 MED ORDER — FAMOTIDINE 20 MG PO TABS
20.0000 mg | ORAL_TABLET | Freq: Two times a day (BID) | ORAL | Status: DC
  Administered 2024-04-16 – 2024-04-17 (×3): 20 mg via ORAL
  Filled 2024-04-16 (×3): qty 1

## 2024-04-16 MED ORDER — APIXABAN 5 MG PO TABS: 10.0000 mg | ORAL_TABLET | Freq: Two times a day (BID) | ORAL | Status: DC

## 2024-04-16 MED ORDER — APIXABAN 5 MG PO TABS: 5.0000 mg | ORAL_TABLET | Freq: Two times a day (BID) | ORAL | Status: DC

## 2024-04-16 MED ORDER — LEVETIRACETAM 500 MG PO TABS
500.0000 mg | ORAL_TABLET | Freq: Two times a day (BID) | ORAL | Status: DC
  Administered 2024-04-16 – 2024-04-17 (×3): 500 mg via ORAL
  Filled 2024-04-16 (×3): qty 1

## 2024-04-16 MED ORDER — CARVEDILOL 6.25 MG PO TABS
6.2500 mg | ORAL_TABLET | Freq: Two times a day (BID) | ORAL | Status: DC
  Administered 2024-04-16 – 2024-04-17 (×2): 6.25 mg via ORAL
  Filled 2024-04-16 (×2): qty 1

## 2024-04-16 MED ORDER — AMLODIPINE BESYLATE 5 MG PO TABS
10.0000 mg | ORAL_TABLET | Freq: Every day | ORAL | Status: DC
  Administered 2024-04-16 – 2024-04-17 (×2): 10 mg via ORAL
  Filled 2024-04-16 (×2): qty 2

## 2024-04-16 MED ORDER — BISACODYL 10 MG RE SUPP
10.0000 mg | Freq: Once | RECTAL | Status: DC
  Filled 2024-04-16: qty 1

## 2024-04-16 NOTE — Progress Notes (Signed)
 IVT consult for new IV placement. Of note, IVT placed USGPIV yesterday with RN assistance. Patient is left arm restricted due to contracture. Right arm has long scar on forearm - USGPIV was placed however scar tissue was noted during insertion and patient did not tolerate this well. [place] sent to RN requesting additional information as flowsheet hasn't been updated. IV was wrapped yesterday with Kerlix due to patient removing previous lines. RN stated that her mitts were off and IV was not present upon assessment this am. Requesting IV hydralazine  for elevated BP.   MD contacted to consider alternative access or switching medications to PO due to extremely limited peripheral options and concern for repeat trauma.   Doree Kuehne R Murice Barbar, RN

## 2024-04-16 NOTE — Plan of Care (Signed)
   Problem: Clinical Measurements: Goal: Ability to maintain clinical measurements within normal limits will improve Outcome: Progressing Goal: Will remain free from infection Outcome: Progressing Goal: Respiratory complications will improve Outcome: Progressing Goal: Cardiovascular complication will be avoided Outcome: Progressing

## 2024-04-16 NOTE — Evaluation (Signed)
 Occupational Therapy Evaluation Patient Details Name: Sarah Myers MRN: [id number] DOB: 31-Mar-1971 Today's Date: 04/16/2024   History of Present Illness   53 y.o. female presents to [facility] hospital on 04/14/2024 after having a seizure. PMH includes ICH with spastic L hemiplegia, PE, HTN, anxiety.     Clinical Impressions Prior to this admission, patient residing at [facility]. Patient recieves assist for her ADLs and is able to transfer to w/c with min A (squat pivot). Patient with potential slowed cognition, however appears to be close to her baseline. Patient eager to eat lunch, therefore OT did not get to look at vision in full depth, but noted difficulty with visual scanning when set up with her food. OT recommending rehab if possible at her SNF; OT will follow acutely.      If plan is discharge home, recommend the following:   A little help with walking and/or transfers;A lot of help with bathing/dressing/bathroom;Assist for transportation     Functional Status Assessment   Patient has had a recent decline in their functional status and demonstrates the ability to make significant improvements in function in a reasonable and predictable amount of time.     Equipment Recommendations   None recommended by OT     Recommendations for Other Services         Precautions/Restrictions   Precautions Precautions: Fall Recall of Precautions/Restrictions: Intact Precaution/Restrictions Comments: L spastic hemiplegia Restrictions Weight Bearing Restrictions Per Provider Order: No     Mobility Bed Mobility Overal bed mobility: Needs Assistance             General bed mobility comments: up in chair upon arrival    Transfers Overall transfer level: Needs assistance Equipment used: None Transfers: Bed to chair/wheelchair/BSC     Squat pivot transfers: Min assist       General transfer comment: repositioned in chair with min A      Balance Overall  balance assessment: Needs assistance Sitting-balance support: No upper extremity supported, Feet supported Sitting balance-Leahy Scale: Fair                                     ADL either performed or assessed with clinical judgement   ADL Overall ADL's : Needs assistance/impaired Eating/Feeding: Set up;Sitting   Grooming: Set up;Wash/dry hands;Sitting   Upper Body Bathing: Minimal assistance;Sitting   Lower Body Bathing: Maximal assistance;Sitting/lateral leans;Sit to/from stand   Upper Body Dressing : Minimal assistance;Sitting   Lower Body Dressing: Maximal assistance;Sitting/lateral leans;Sit to/from stand   Toilet Transfer: Minimal Soil scientist Details (indicate cue type and reason): simulated Toileting- Clothing Manipulation and Hygiene: Maximal assistance;Sit to/from stand;Sitting/lateral lean       Functional mobility during ADLs: Moderate assistance;Cueing for sequencing;Cueing for safety General ADL Comments: Prior to this admission, patient residing at [facility]. Patient recieves assist for her ADLs and is able to transfer to w/c with min A (squat pivot). Patient with potential slowed cognition, however appears to be close to her baseline. Patient eager to eat lunch, therefore OT did not get to look at vision in full depth, but noted difficulty with visual scanning when set up with her food. OT recommending rehab if possible at her SNF; OT will follow acutely.     Vision Patient Visual Report: No change from baseline Vision Assessment?: Vision impaired- to be further tested in functional context Additional Comments: Patient with prior visual  impairments, difficulty scanning in session, will continue to assess     Perception Perception: Not tested       Praxis Praxis: Not tested       Pertinent Vitals/Pain Pain Assessment Pain Assessment: Faces Faces Pain Scale: Hurts a little bit Pain Location: L side Pain  Descriptors / Indicators: Grimacing Pain Intervention(s): Limited activity within patient's tolerance, Monitored during session, Repositioned     Extremity/Trunk Assessment Upper Extremity Assessment Upper Extremity Assessment: LUE deficits/detail LUE Deficits / Details: LUE contracted into decorticate positioning. Pt with no AROM noted from LUE. Pt with minimal digit extension available, elbow passive extension to ~90 degrees, shoulder with minimal AROM <10 degrees in each plane   Lower Extremity Assessment Lower Extremity Assessment: Defer to PT evaluation LLE Deficits / Details: flicker of toe extension AROM, pt with ~15 degree PF contracture, passive knee extension to ~45 degrees, hip ROM not assessed but passive to at least 90 degrees of flexion in sitting   Cervical / Trunk Assessment Cervical / Trunk Assessment: Kyphotic   Communication Communication Communication: Other (comment) (soft spoken)   Cognition Arousal: Alert Behavior During Therapy: WFL for tasks assessed/performed Cognition: Difficult to assess             OT - Cognition Comments: Patient alert and participatory, a little slower in responses but able to tell OT her whole routine and answer questions approrpiately, did not recall why she was admitted                 Following commands: Intact       Cueing  General Comments   Cueing Techniques: Verbal cues  VSS on RA   Exercises     Shoulder Instructions      Home Living Family/patient expects to be discharged to:: Skilled nursing facility                                        Prior Functioning/Environment Prior Level of Function : Needs assist             Mobility Comments: pt reports performing squat pivot transfers to a manual wheelchair, states she is able to mobilize in the wheelchair around the facility by utilizing her R side to propel and steer ADLs Comments: assistance for bathing, dressing, IADLs    OT  Problem List: Impaired balance (sitting and/or standing);Decreased activity tolerance;Decreased strength;Decreased range of motion;Impaired vision/perception;Decreased coordination;Decreased cognition;Decreased safety awareness;Impaired tone;Impaired UE functional use   OT Treatment/Interventions: Therapeutic exercise;Self-care/ADL training;Energy conservation;DME and/or AE instruction;Manual therapy;Patient/family education;Balance training;Cognitive remediation/compensation;Visual/perceptual remediation/compensation;Therapeutic activities;Neuromuscular education      OT Goals(Current goals can be found in the care plan section)   Acute Rehab OT Goals Patient Stated Goal: to get better OT Goal Formulation: With patient Time For Goal Achievement: 04/30/24 Potential to Achieve Goals: Fair ADL Goals Pt Will Perform Lower Body Bathing: with mod assist;sitting/lateral leans Pt Will Perform Lower Body Dressing: with mod assist;sitting/lateral leans Pt Will Transfer to Toilet: squat pivot transfer;with contact guard assist;bedside commode Pt Will Perform Toileting - Clothing Manipulation and hygiene: with contact guard assist;sitting/lateral leans;sit to/from stand Additional ADL Goal #1: Patient will be able to visually scan environment to identify 5 items on left and right without assist to promote ADL and IADL independence.   OT Frequency:  Min 1X/week    Co-evaluation  AM-PAC OT "6 Clicks" Daily Activity     Outcome Measure Help from another person eating meals?: A Little Help from another person taking care of personal grooming?: A Little Help from another person toileting, which includes using toliet, bedpan, or urinal?: A Lot Help from another person bathing (including washing, rinsing, drying)?: A Lot Help from another person to put on and taking off regular upper body clothing?: A Little Help from another person to put on and taking off regular lower body  clothing?: A Lot 6 Click Score: 15   End of Session Nurse Communication: Mobility status  Activity Tolerance: Patient tolerated treatment well Patient left: in chair;with call bell/phone within reach;with chair alarm set  OT Visit Diagnosis: Other abnormalities of gait and mobility (R26.89);Other symptoms and signs involving cognitive function;Muscle weakness (generalized) (M62.81);Unsteadiness on feet (R26.81)                Time: 7829-5621 OT Time Calculation (min): 18 min Charges:  OT General Charges $OT Visit: 1 Visit OT Evaluation $OT Eval Moderate Complexity: 1 Mod  Mollie Anger E. Tristyn Pharris, OTR/L Acute Rehabilitation Services [phone]   Vincent Greek 04/16/2024, 4:05 PM

## 2024-04-16 NOTE — Progress Notes (Signed)
 vLTM discintinued  No skin breakdown noted at all skin sites  [facility] notified

## 2024-04-16 NOTE — Progress Notes (Signed)
 Hypoglycemic Event  CBG: 66  Treatment: D50 50 mL (25 gm)  Symptoms: None  Follow-up CBG: Time:0157 CBG Result:214  Possible Reasons for Event: Inadequate meal intake  Comments/MD notified:Hypoglycemic protocol interventions successful.  MD not notified at this time.    Francis Irons

## 2024-04-16 NOTE — Progress Notes (Signed)
 Patient noted to have NO IV access during assessment. Per IV team, patient has limited venous access and was a difficult stick despite ultrasound and would need alternative access if IV medications requited. Patient needing IV keppra and PRN hydralazine .  Notified MD, and IV medications changed to PO.

## 2024-04-16 NOTE — Evaluation (Signed)
 Physical Therapy Evaluation Patient Details Name: Sarah Myers MRN: [id number] DOB: January 27, 1971 Today's Date: 04/16/2024  History of Present Illness  53 y.o. female presents to [facility] hospital on 04/14/2024 after having a seizure. PMH includes ICH with spastic L hemiplegia, PE, HTN, anxiety.  Clinical Impression  Pt presents to PT with deficits in functional mobility, strength, power, ROM. Most of pt's current deficits appear chronic due to L spastic hemiplegia, however the pt does report increased difficulty with bed mobility at this time and her core and R sided strength may be altered by recent seizure. Pt requires physical assistance to mobilize to the edge of bed as well as some assistance to pivot to wheelchair. PT recommends short term inpatient PT services at the time of return to the pt's SNF.       If plan is discharge home, recommend the following: A lot of help with walking and/or transfers;A lot of help with bathing/dressing/bathroom;Assistance with cooking/housework;Direct supervision/assist for medications management;Assist for transportation;Direct supervision/assist for financial management;Help with stairs or ramp for entrance;Supervision due to cognitive status   Can travel by private vehicle   No    Equipment Recommendations None recommended by PT  Recommendations for Other Services       Functional Status Assessment Patient has had a recent decline in their functional status and demonstrates the ability to make significant improvements in function in a reasonable and predictable amount of time.     Precautions / Restrictions Precautions Precautions: Fall Recall of Precautions/Restrictions: Intact Precaution/Restrictions Comments: L spastic hemiplegia Restrictions Weight Bearing Restrictions Per Provider Order: No      Mobility  Bed Mobility Overal bed mobility: Needs Assistance Bed Mobility: Supine to Sit     Supine to sit: Min assist, HOB elevated, Used  rails     General bed mobility comments: increased time, assistance to scoot L hip toward edge of bed    Transfers Overall transfer level: Needs assistance Equipment used: None Transfers: Bed to chair/wheelchair/BSC       Squat pivot transfers: Min assist          Ambulation/Gait                  Stairs            Wheelchair Mobility     Tilt Bed    Modified Rankin (Stroke Patients Only)       Balance Overall balance assessment: Needs assistance Sitting-balance support: No upper extremity supported, Feet supported Sitting balance-Leahy Scale: Fair                                       Pertinent Vitals/Pain Pain Assessment Pain Assessment: Faces Faces Pain Scale: Hurts little more Pain Location: L side Pain Descriptors / Indicators: Grimacing Pain Intervention(s): Monitored during session    Home Living Family/patient expects to be discharged to:: Skilled nursing facility                        Prior Function Prior Level of Function : Needs assist             Mobility Comments: pt reports performing squat pivot transfers to a manual wheelchair, states she is able to mobilize in the wheelchair around the facility by utilizing her R side to propel and steer ADLs Comments: assistance for bathing, dressing, IADLs     Extremity/Trunk Assessment  Upper Extremity Assessment Upper Extremity Assessment: LUE deficits/detail LUE Deficits / Details: LUE contracted into decorticate positioning. Pt with no AROM noted from LUE. Pt with minimal digit extension available, elbow passive extension to ~90 degrees, shoulder with minimal AROM <10 degrees in each plane    Lower Extremity Assessment Lower Extremity Assessment: LLE deficits/detail LLE Deficits / Details: flicker of toe extension AROM, pt with ~15 degree PF contracture, passive knee extension to ~45 degrees, hip ROM not assessed but passive to at least 90 degrees of  flexion in sitting    Cervical / Trunk Assessment Cervical / Trunk Assessment: Kyphotic  Communication   Communication Communication: Other (comment) (soft spoken)    Cognition Arousal: Alert Behavior During Therapy: WFL for tasks assessed/performed   PT - Cognitive impairments: History of cognitive impairments, Memory                       PT - Cognition Comments: pt initially reporting residing at a rehab facility, later reports the pt and her daughter live together in an apartment Following commands: Intact       Cueing Cueing Techniques: Verbal cues     General Comments General comments (skin integrity, edema, etc.): VSS on RA    Exercises     Assessment/Plan    PT Assessment Patient needs continued PT services  PT Problem List Decreased strength;Decreased activity tolerance;Decreased balance;Decreased mobility;Decreased cognition;Decreased knowledge of use of DME;Decreased safety awareness;Decreased knowledge of precautions;Impaired tone       PT Treatment Interventions DME instruction;Functional mobility training;Therapeutic exercise;Therapeutic activities;Balance training;Neuromuscular re-education;Cognitive remediation;Patient/family education;Wheelchair mobility training    PT Goals (Current goals can be found in the Care Plan section)  Acute Rehab PT Goals Patient Stated Goal: to improve mobility quality and strength PT Goal Formulation: With patient Time For Goal Achievement: 04/30/24 Potential to Achieve Goals: Fair Additional Goals Additional Goal #1: Pt will mobilize in a manual wheelchair for 100' at a supervision level to demonstrate the ability to mobilize for household distances    Frequency Min 1X/week     Co-evaluation               AM-PAC PT "6 Clicks" Mobility  Outcome Measure Help needed turning from your back to your side while in a flat bed without using bedrails?: A Little Help needed moving from lying on your back to  sitting on the side of a flat bed without using bedrails?: A Little Help needed moving to and from a bed to a chair (including a wheelchair)?: A Little Help needed standing up from a chair using your arms (e.g., wheelchair or bedside chair)?: A Lot Help needed to walk in hospital room?: Total Help needed climbing 3-5 steps with a railing? : Total 6 Click Score: 13    End of Session   Activity Tolerance: Patient tolerated treatment well Patient left: in chair;with call bell/phone within reach;with chair alarm set Nurse Communication: Mobility status PT Visit Diagnosis: Other abnormalities of gait and mobility (R26.89);Muscle weakness (generalized) (M62.81);Hemiplegia and hemiparesis Hemiplegia - Right/Left: Left Hemiplegia - dominant/non-dominant: Non-dominant Hemiplegia - caused by: Other Nontraumatic intracranial hemorrhage (chronic)    Time: 4696-2952 PT Time Calculation (min) (ACUTE ONLY): 29 min   Charges:   PT Evaluation $PT Eval Low Complexity: 1 Low   PT General Charges $$ ACUTE PT VISIT: 1 Visit         Rexie Catena, PT, DPT Acute Rehabilitation Office [phone]   Rexie Catena 04/16/2024, 1:42 PM

## 2024-04-16 NOTE — Procedures (Signed)
 Patient Name: Sarah Myers  MRN: [id number]  Epilepsy Attending: Arleene Lack  Referring Physician/Provider: Tona Francis, MD  Duration: 04/15/2024 2350 to 04/16/2024 0615   Patient history:  53 y.o. female with above past medical history presenting for a seizure that lasted about 20 minutes at the facility.  Prior history of 1 seizure-like episode. EEG to evaluate for seizure   Level of alertness: Awake, asleep   AEDs during EEG study: LEV   Technical aspects: This EEG study was done with scalp electrodes positioned according to the 10-20 International system of electrode placement. Electrical activity was reviewed with band pass filter of 1-70Hz , sensitivity of 7 uV/mm, display speed of 16mm/sec with a 60Hz  notched filter applied as appropriate. EEG data were recorded continuously and digitally stored.  Video monitoring was available and reviewed as appropriate.   Description: The posterior dominant rhythm consists of 8-9 Hz activity of moderate voltage (25-35 uV) seen predominantly in posterior head regions, symmetric and reactive to eye opening and eye closing. Sleep was characterized by vertex waves, sleep spindles (12 to 14 Hz), maximal frontocentral region. Intermittent generalized 3-5hz  theta-delta slowing was noted. Hyperventilation and photic stimulation were not performed.     Study was technically difficult as patient was pulling electrodes  ABNORMALITY - Intermittent slow, generalized   IMPRESSION: This study is suggestive of mild diffuse encephalopathy. No seizures were noted.  Gaven Eugene O Jalani Cullifer

## 2024-04-16 NOTE — Progress Notes (Signed)
 NEUROLOGY CONSULT FOLLOW UP NOTE   Date of service: April 16, 2024 Patient Name: Sarah Myers MRN:  [id number] DOB:  1971/03/19  Interval Hx/subjective   This morning's LTM read shows generalized slowing, no seizures noted. Will discontinue LTM.   MRI Brain negative for acute abnormality.   Vitals   Vitals:   04/15/24 1937 04/16/24 0009 04/16/24 0354 04/16/24 0750  BP: 130/78 (!) 141/98 (!) 151/108 (!) 176/96  Pulse: 66 67 (!) 59 65  Resp: 17 17 18 18   Temp: 97.8 F (36.6 C) (!) 97.5 F (36.4 C) (!) 97.5 F (36.4 C) 97.7 F (36.5 C)  TempSrc:    Oral  SpO2: 100% 99% 99% 97%  Weight:      Height:         Body mass index is 15.41 kg/m.  Physical Exam   Constitutional: well developed in NAD Psych: flat Eyes: No scleral injection.   HENT: No OP obstrucion.   Head: Normocephalic.   Cardiovascular: Normal rate and regular rhythm.   Respiratory: Effort normal, non-labored breathing.   GI: Soft.  No distension. There is no tenderness.   Skin: WDI.    Neurologic Examination   Mental Status -  Lethargic, but responsive. Oriented to self and place. Did not answer other orientation questions. Follows commands intermittently. Poor attention and concentration  Cranial Nerves II - XII - II - Visual field intact OU. III, IV, VI - Extraocular movements intact. V - Facial sensation intact bilaterally. VII - left facial droop VIII - Hearing intact to voice X - Palate elevates symmetrically. XI - Chin turning & shoulder shrug intact bilaterally. XII - Tongue protrusion intact.  Motor Strength - left arm and leg with contractures, right arm and right leg moves spontaneous and antigravity  Sensory - decreased on left to noxious stimuli  Coordination -unable to assess Gait and Station - deferred.  Medications  Current Facility-Administered Medications:    enoxaparin  (LOVENOX ) injection 40 mg, 40 mg, Subcutaneous, Q12H, Delorse Fey, RPH, 40 mg at 04/15/24 2100    hydrALAZINE  (APRESOLINE ) injection 10 mg, 10 mg, Intravenous, Q4H PRN, Angelene Kelly, MD, 10 mg at 04/15/24 0530   levETIRAcetam (KEPPRA) IVPB 500 mg/100 mL premix, 500 mg, Intravenous, Q12H, Angelene Kelly, MD, Last Rate: 400 mL/hr at 04/15/24 2053, 500 mg at 04/15/24 2053   polyethylene glycol (MIRALAX  / GLYCOLAX ) packet 17 g, 17 g, Oral, Daily, Krishnan, Gokul, MD, 17 g at 04/15/24 2045   senna-docusate (Senokot-S) tablet 2 tablet, 2 tablet, Oral, BID, Maylene Spear, MD, 2 tablet at 04/15/24 2100  Labs and Diagnostic Imaging   CBC:  Recent Labs  Lab 04/14/24 1425 04/15/24 0531 04/16/24 0621  WBC 7.9 6.8 3.7*  NEUTROABS 7.2 4.9  --   HGB 13.7 14.2 12.0  HCT 43.3 43.0 36.4  MCV 88.4 85.5 86.1  PLT 114* 114* 119*    Basic Metabolic Panel:  Lab Results  Component Value Date   NA 139 04/16/2024   K 3.8 04/16/2024   CO2 21 (L) 04/16/2024   GLUCOSE 70 04/16/2024   BUN 11 04/16/2024   CREATININE 0.65 04/16/2024   CALCIUM 10.4 (H) 04/16/2024   GFRNONAA >60 04/16/2024   GFRAA 119 08/18/2020   Lipid Panel:  Lab Results  Component Value Date   LDLCALC 64 02/06/2022   HgbA1c:  Lab Results  Component Value Date   HGBA1C 5.6 02/06/2022   Urine Drug Screen:     Component Value Date/Time   LABOPIA  NONE DETECTED 04/14/2024 1540   COCAINSCRNUR NONE DETECTED 04/14/2024 1540   LABBENZ POSITIVE (A) 04/14/2024 1540   AMPHETMU NONE DETECTED 04/14/2024 1540   THCU NONE DETECTED 04/14/2024 1540   LABBARB NONE DETECTED 04/14/2024 1540    Alcohol Level     Component Value Date/Time   ETH <15 04/14/2024 1426   INR  Lab Results  Component Value Date   INR 1.3 (H) 02/10/2024   APTT  Lab Results  Component Value Date   APTT 34 10/13/2023   AED levels: No results found for: "PHENYTOIN", "ZONISAMIDE", "LAMOTRIGINE", "LEVETIRACETA"  CT Head without contrast: No acute process  MRI Brain: No acute intracranial abnormality 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  LTM EEG 4/230: Intermittent slow, generalized. Study suggestive of mild diffuse encephalopathy. No seizures were noted.   Assessment  Sarah Myers is a 53 y.o. female  53 y.o. female with above past medical history including one prior seizure, presenting for a seizure that lasted about 20 minutes at her facility.  On valproate prior to admission - likely for behavioral reasons. Case discussed with the on-call neurologist who recommended valproate load and starting standing doses of valproate.  The issue is that she is also on Eliquis  for PE and Dilantin and Depakote  may not be good drugs due to interaction.  Depakote  changed to Keppra. - Exam today reveals patient to be lethargic, but responsive. Intermittently follows simple commands, poor attention. Continued baseline left hemiplegia with contractures and left hemi-sensory loss.  - Imaging as above.  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   Neurology will sign off. Please recall with any further needs.  Thank you for this consult.  ____________________________________________________________________   Pt seen by Neuro NP/APP and discussed with Neurology attending. Audrene Lease, DNP, AGACNP-BC [facility] Please use AMION for contact information & EPIC for messaging.  Electronically signed: Dr. Gayna Braddy

## 2024-04-16 NOTE — Progress Notes (Signed)
 Speech Language Pathology Treatment: Dysphagia  Patient Details Name: Sarah Myers MRN: [id number] DOB: 1971/07/19 Today's Date: 04/16/2024 Time: 1005-1020 SLP Time Calculation (min) (ACUTE ONLY): 15 min  Assessment / Plan / Recommendation Clinical Impression  Patient seen by SLP for skilled treatment focused on dysphagia goals. When SLP arrived, patient had meal tray in front of her but majority of PO's had been consumed. She was significantly more awake and interactive than previous date. She indicated that she was still hungry. SLP then assessed her toleration of dysphagia 3 (mechanical soft) solids which she consumed with thin liquids. Mastication was complete, but patient slightly impulsive and leaning to the left, leading to delay in oral clearance of PO's and anterior spillage on left side. No overt s/s aspiration with any PO's. SLP is recommending advance to Dys 3 (mechanical soft) solids, thin liquids with intermittent supervision and to allow PO's only when fully awake and alert. SLP will continue to follow.    HPI HPI: Patient is a 53 y.o. 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 She presented to the hospital on 04/14/24 after a witnessed seizure at SNF which lasted 20 minutes in the facility as well as on the way to ER. CT head unremarkable, basic metabolic panel and CBC unremarkable, EKG showed normal sinus rhythm. She was admitted for seizure and postictal state. She is NPO awaiting SLP swallow evaluation.      SLP Plan  Continue with current plan of care      Recommendations for follow up therapy are one component of a multi-disciplinary discharge planning process, led by the attending physician.  Recommendations may be updated based on patient status, additional functional criteria and insurance authorization.    Recommendations  Diet recommendations: Dysphagia 3  (mechanical soft);Thin liquid Liquids provided via: Cup;Straw Medication Administration: Whole meds with puree Supervision: Patient able to self feed;Intermittent supervision to cue for compensatory strategies Compensations: Slow rate;Small sips/bites;Minimize environmental distractions Postural Changes and/or Swallow Maneuvers: Seated upright 90 degrees                  Oral care BID   Frequent or constant Supervision/Assistance Dysphagia, unspecified (R13.10)     Continue with current plan of care     Jacqualine Mater, MA, CCC-SLP Speech Therapy

## 2024-04-16 NOTE — Progress Notes (Addendum)
 PROGRESS NOTE   Sarah Myers  ZOX:[id number]    DOB: [birth year]    DOA: 04/14/2024  PCP: Karron Pagan, MD   I have briefly reviewed patients previous medical records in [facility].   Brief Hospital Course:  53 y.o. 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  As per the report patient was given Versed  by the EMS.  Patient was hospitalized for further management.    Assessment & Plan:   Seizure Patient was observed to have generalized tonic-clonic seizure per reports.  She lives in a nursing facility.  As per neurology, history of 1 prior seizure. 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  Brain Prior to admission patient noted to be on amantadine , Depakote , gabapentin , hydroxyzine , mirtazapine . Patient was initially started on valproate but due to drug to drug interactions, valproate was changed to Keppra.  Currently only on Keppra.  Neurology to kindly recommend regarding multiple other meds that patient was on PTA, whether to start or discontinue.  Extremely difficult IV access and hence changed IV Keppra to oral since patient tolerating diet very well. Neurology was consulted and continue to follow. LTM EEG on 4/29: Showed 2 seizures without clinical signs arising from left temporal region at 5:13 AM and 7:24 AM, lasting for about 1 minute 10 seconds and 1 minute respectively.  LTM EEG on 4/30 suggestive of mild diffuse encephalopathy with no seizures. MRI brain 4/29 shows no acute intracranial abnormality.  Shows chronic left PCA infarct and remote right basal ganglia hemorrhage, moderate chronic small vessel ischemic disease and unchanged bilateral cribriform  plate encephaloceles.   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  CT head did not show any acute findings. Noted to have left-sided contractures.  Currently home dose of Zanaflex  and baclofen  on hold, could resume at DC.   History of pulmonary embolism On Eliquis  prior to admission which is currently changed over to Lovenox  still swallow function has been assessed.  As per SLP evaluation, return to SNF when stable.   Essential hypertension/refractory hypertension Noted to be on amlodipine , carvedilol , hydralazine , losartan  prior to admission Since patient tolerating diet well, resumed some oral of home medications with close monitoring.  Resumed home dose of amlodipine , carvedilol  and Cozaar .  In the absence of IV access, will change hydralazine  to p.o. as needed as well.   Mood disorder On review of home meds on 4/30, not on Wellbutrin .  For now BuSpar , amantadine  and Remeron  are on hold  Dysphagia SLP input appreciated and recommended dysphagia 1 (pure) diet and thin liquids.  Tolerating well.  Constipation LBM 4/28.  However pelvis x-ray on 4/29 showed stool ball distending the rectum.  Trial of Dulcolax suppositories, aggressive bowel regimen.  Thrombocytopenia Unclear etiology.  Stable.?  Related to Lovenox  which is being discontinued today.  Follow CBCs.  Body mass index is 15.41 kg/m./Underweight OP follow up  DVT prophylaxis: enoxaparin  (LOVENOX ) injection 40 mg Start: 04/15/24 0045     Code Status: Full Code:  Family Communication: None at bedside Disposition:  Status is: Inpatient Remains inpatient appropriate because: Continued medical optimization prior to discharge back to SNF, hopefully on 5/1.  Consultants:   Neurology  Procedures:   LTM EEG  Subjective:  Patient denies complaints.  States that prior to stroke, she used to work taking care of elderly folks.  Objective:    Vitals:   04/15/24 1937 04/16/24 0009 04/16/24 0354 04/16/24 0750  BP: 130/78 (!) 141/98 (!) 151/108 (!) 176/96  Pulse: 66 67 (!) 59 65  Resp: 17 17 18 18   Temp: 97.8 F (36.6 C) (!) 97.5 F (36.4 C) (!) 97.5 F (36.4 C) 97.7 F (36.5 C)  TempSrc:    Oral  SpO2: 100% 99% 99% 97%  Weight:      Height:        General exam: Young female, moderately built and frail, sitting up comfortably in bed and eating breakfast with her right hand.  Has completed almost 100% of her pured diet. Respiratory system: Clear to auscultation. Respiratory effort normal. Cardiovascular system: S1 & S2 heard, RRR. No JVD, murmurs, rubs, gallops or clicks. No pedal edema. Gastrointestinal system: Abdomen is nondistended, soft and nontender. No organomegaly or masses felt. Normal bowel sounds heard. Central nervous system: Alert and oriented. No cranial nerve deficits. Extremities: Chronic left-sided hemiplegia with contractures.  Right limb power is grade 5 x 5 power Skin: No rashes, lesions or ulcers Psychiatry: Judgement and insight appear normal. Mood & affect appropriate.     Data Reviewed:   I have personally reviewed following labs and imaging studies   CBC: Recent Labs  Lab 04/14/24 1425 04/15/24 0531 04/16/24 0621  WBC 7.9 6.8 3.7*  NEUTROABS 7.2 4.9  --   HGB 13.7 14.2 12.0  HCT 43.3 43.0 36.4  MCV 88.4 85.5 86.1  PLT 114* 114* 119*    Basic Metabolic Panel: Recent Labs  Lab 04/14/24 1415 04/14/24 1425 04/15/24 0531 04/16/24 0552  NA 139 138 137 139  K 4.7 4.8 4.2 3.8  CL  --  106 105 106  CO2  --  22 23 21*  GLUCOSE  --  128* 78 70  BUN  --  17 12 11   CREATININE  --  0.74 0.64 0.65  CALCIUM  --  10.2 10.5* 10.4*  MG  --   --  2.0  --     Liver Function Tests: Recent Labs  Lab 04/14/24 1425 04/15/24 0531  AST 23 19  ALT 12 16  ALKPHOS 57 65  BILITOT 0.6 0.7  PROT 7.1 7.5  ALBUMIN  3.4* 3.5    CBG: Recent Labs  Lab 04/16/24 0127 04/16/24 0157  04/16/24 0618  GLUCAP 66* 214* 74    Microbiology Studies:  No results found for this or any previous visit (from the past 240 hours).  Radiology Studies:  DG Pelvis Portable Result Date: 04/15/2024 CLINICAL DATA:  Left hip pain. EXAM: PORTABLE PELVIS 1-2 VIEWS COMPARISON:  None Available. FINDINGS: AP views of the pelvis submitted. Technically limited it difficulties with patient positioning. No evidence of acute fracture. Slight hip joint space narrowing. There is mild left acetabular spurring. No evidence of erosion, avascular necrosis or bone destruction. Pubic rami are intact. Pubic symphysis and sacroiliac joints are congruent. A stool ball distends the rectum. IMPRESSION: 1. Mild left hip degenerative change.  No acute findings. 2. Stool ball distends the rectum. Electronically Signed   By: Chadwick Colonel M.D.   On: 04/15/2024 16:47   MR BRAIN WO CONTRAST Result Date: 04/15/2024 CLINICAL DATA:  Seizure disorder, clinical change. EXAM: MRI HEAD WITHOUT CONTRAST TECHNIQUE: Multiplanar, multiecho pulse sequences of the brain  and surrounding structures were obtained without intravenous contrast. COMPARISON:  Head CT 04/14/2024 and MRI 10/13/2023 FINDINGS: Brain: There is no evidence of an acute infarct, mass, midline shift, or extra-axial fluid collection. Hemosiderin stained encephalomalacia is noted in the right basal ganglia and surrounding white matter extending into the temporal lobe related to a remote hemorrhage, and there is associated wallerian degeneration in the brainstem. A moderate-sized chronic left occipital infarct is again noted with ex vacuo dilatation of the occipital horn of the left lateral ventricle. T2 hyperintensities elsewhere in the cerebral white matter bilaterally are nonspecific but compatible with chronic small vessel ischemic disease, moderate for age. There is mild-to-moderate cerebral atrophy. There is mild asymmetric volume loss of the left hippocampus. An  enlarged, partially empty sella and CSF enlargement of Meckel's caves bilaterally are unchanged and can be seen with intracranial hypertension. There are also unchanged bilateral cribriform plate encephaloceles extending into the ethmoid sinuses. Vascular: Major intracranial vascular flow voids are preserved. Skull and upper cervical spine: Unremarkable bone marrow signal. Sinuses/Orbits: Unremarkable orbits. Encephalocele is a as noted above. No evidence of acute inflammatory sinus disease. Clear mastoid air cells. Other: None. IMPRESSION: 1. No acute intracranial abnormality. 2. Chronic left PCA infarct and remote right basal ganglia hemorrhage. 3. Moderate chronic small vessel ischemic disease. 4. Unchanged bilateral cribriform plate encephaloceles. Electronically Signed   By: Aundra Lee M.D.   On: 04/15/2024 15:47   Overnight EEG with video Result Date: 04/15/2024 Arleene Lack, MD     04/16/2024  7:23 AM Patient Name: Sarah Myers MRN: [id number] Epilepsy Attending: Arleene Lack Referring Physician/Provider: Tona Francis, MD Duration: 04/14/2024 2145 to 04/15/2024 2350 Patient history:  53 y.o. female with above past medical history presenting for a seizure that lasted about 20 minutes at the facility.  Prior history of 1 seizure-like episode. EEG to evaluate for seizure Level of alertness: Awake, asleep AEDs during EEG study: LEV Technical aspects: This EEG study was done with scalp electrodes positioned according to the 10-20 International system of electrode placement. Electrical activity was reviewed with band pass filter of 1-70Hz , sensitivity of 7 uV/mm, display speed of 31mm/sec with a 60Hz  notched filter applied as appropriate. EEG data were recorded continuously and digitally stored.  Video monitoring was available and reviewed as appropriate. Description: The posterior dominant rhythm consists of 8-9 Hz activity of moderate voltage (25-35 uV) seen predominantly in posterior head  regions, symmetric and reactive to eye opening and eye closing. Sleep was characterized by vertex waves, sleep spindles (12 to 14 Hz), maximal frontocentral region. Hyperventilation and photic stimulation were not performed.   Two seizures without clinical signs were noted on 04/15/2024 at 0513 and 0724. During seizure EEG showed sharp waves in left temporal region followed by rhythmic 5 to 6 Hz theta-delta slowing which then involve all of left frontotemporal region. EEG then evolved into 2 to 3 Hz high amplitude sharply contoured delta slowing and involved left parieto-occipital region. Eventually EEG also involved right hemisphere. Around this time patient woke up but again no clinical signs were noted. Total duration of seizure was about 1 minute 10 seconds and 1 minute respectively. EEG was disconnected between 1312 to 1516 for mri brain ABNORMALITY - Seizure without clinical signs, left temporal region IMPRESSION: This study showed two seizures without clinical signs arising from left temporal region on/ 04/15/2024 at 0513 and 0724, lasting for about 1 minute 10 seconds and 1 minute respectively. Priyanka O Yadav   CT HEAD WO  CONTRAST Result Date: 04/14/2024 CLINICAL DATA:  Altered mental status. EXAM: CT HEAD WITHOUT CONTRAST TECHNIQUE: Contiguous axial images were obtained from the base of the skull through the vertex without intravenous contrast. RADIATION DOSE REDUCTION: This exam was performed according to the departmental dose-optimization program which includes automated exposure control, adjustment of the mA and/or kV according to patient size and/or use of iterative reconstruction technique. COMPARISON:  Head CT dated 02/12/2024. FINDINGS: Brain: Mild age-related atrophy and chronic microvascular ischemic changes. Left occipital old infarct. There is no acute intracranial hemorrhage. No mass effect or midline shift no extra-axial fluid collection. Vascular: No hyperdense vessel or unexpected  calcification. Skull: Normal. Negative for fracture or focal lesion. Sinuses/Orbits: No acute findings. 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 Other: None. IMPRESSION: 1. No acute intracranial pathology. 2. Mild age-related atrophy and chronic microvascular ischemic changes. Left occipital old infarct. Electronically Signed   By: Angus Bark M.D.   On: 04/14/2024 16:29   DG Chest Port 1 View Result Date: 04/14/2024 CLINICAL DATA:  Altered mental status.  Seizure-like activity. EXAM: PORTABLE CHEST 1 VIEW COMPARISON:  Radiographs 02/09/2024 and 11/10/2023.  CT 10/17/2022. FINDINGS: 1415 hours. The heart size and mediastinal contours are normal. The lungs are clear. There is no pleural effusion or pneumothorax. No acute osseous findings are identified. Telemetry leads overlie the chest. IMPRESSION: No evidence of acute cardiopulmonary process. Electronically Signed   By: Elmon Hagedorn M.D.   On: 04/14/2024 14:57    Scheduled Meds:    enoxaparin  (LOVENOX ) injection  40 mg Subcutaneous Q12H   levETIRAcetam  500 mg Oral BID   polyethylene glycol  17 g Oral Daily   senna-docusate  2 tablet Oral BID    Continuous Infusions:     LOS: 1 day     Aubrey Blas, MD,  FACP, [facility], [facility], [facility]   [facility] [place]      To contact the attending provider between 7A-7P or the covering provider during after hours 7P-7A, please log into the web site [email or web address] and access using universal  password for that web site. If you do not have the password, please call the hospital operator.  04/16/2024, 9:23 AM

## 2024-04-17 DIAGNOSIS — R1312 Dysphagia, oropharyngeal phase: Secondary | ICD-10-CM | POA: Diagnosis not present

## 2024-04-17 DIAGNOSIS — D696 Thrombocytopenia, unspecified: Secondary | ICD-10-CM | POA: Diagnosis not present

## 2024-04-17 DIAGNOSIS — R569 Unspecified convulsions: Secondary | ICD-10-CM | POA: Diagnosis not present

## 2024-04-17 LAB — CBC
HCT: 37.8 % (ref 36.0–46.0)
Hemoglobin: 12.8 g/dL (ref 12.0–15.0)
MCH: 28.9 pg (ref 26.0–34.0)
MCHC: 33.9 g/dL (ref 30.0–36.0)
MCV: 85.3 fL (ref 80.0–100.0)
Platelets: 121 10*3/uL — ABNORMAL LOW (ref 150–400)
RBC: 4.43 MIL/uL (ref 3.87–5.11)
RDW: 13.1 % (ref 11.5–15.5)
WBC: 4.6 10*3/uL (ref 4.0–10.5)
nRBC: 0 % (ref 0.0–0.2)

## 2024-04-17 LAB — GLUCOSE, CAPILLARY
Glucose-Capillary: 127 mg/dL — ABNORMAL HIGH (ref 70–99)
Glucose-Capillary: 129 mg/dL — ABNORMAL HIGH (ref 70–99)
Glucose-Capillary: 88 mg/dL (ref 70–99)

## 2024-04-17 MED ORDER — BISACODYL 10 MG RE SUPP: 10.0000 mg | Freq: Every day | RECTAL | Status: AC | PRN

## 2024-04-17 MED ORDER — SENNOSIDES-DOCUSATE SODIUM 8.6-50 MG PO TABS: 1.0000 | ORAL_TABLET | Freq: Two times a day (BID) | ORAL | Status: AC

## 2024-04-17 MED ORDER — LEVETIRACETAM 500 MG PO TABS: 500.0000 mg | ORAL_TABLET | Freq: Two times a day (BID) | ORAL | Status: DC

## 2024-04-17 MED ORDER — MILK AND MOLASSES ENEMA
1.0000 | Freq: Once | RECTAL | Status: AC
  Administered 2024-04-17: 240 mL via RECTAL
  Filled 2024-04-17: qty 240

## 2024-04-17 NOTE — Progress Notes (Signed)
 Report given to [place] at [facility].

## 2024-04-17 NOTE — Plan of Care (Signed)

## 2024-04-17 NOTE — Discharge Instructions (Signed)

## 2024-04-17 NOTE — TOC Transition Note (Signed)
 Transition of Care [facility]) - Discharge Note   Patient Details  Name: Sarah Myers MRN: [id number] Date of Birth: February 11, 1971  Transition of Care [facility]) CM/SW Contact:  Tandy Fam, LCSW Phone Number: 04/17/2024, 2:50 PM   Clinical Narrative:   CSW updated by MD that patient was asking about going home with family. CSW contacted daughter, Sarah Myers, to discuss, and family is not able to provide the care; patient has to return to SNF, and daughter in agreement with patient returning today if medically stable. CSW sent discharge information to [facility], confirmed they are able to accept patient back. Transport arranged with Lyna Sandhoff, and Sarah Myers updated with transport time. No further TOC needs at this time.    Final next level of care: Skilled Nursing Facility Barriers to Discharge: Barriers Resolved   Patient Goals and CMS Choice            Discharge Placement              Patient chooses bed at:  [facility]) Patient to be transferred to facility by: [facility] Name of family member notified: Sarah Myers Patient and family notified of of transfer: 04/17/24  Discharge Plan and Services Additional resources added to the After Visit Summary for                                       Social Drivers of Health (SDOH) Interventions SDOH Screenings   Food Insecurity: No Food Insecurity (04/16/2024)  Housing: Low Risk  (04/16/2024)  Transportation Needs: No Transportation Needs (04/16/2024)  Utilities: Not At Risk (04/16/2024)  Depression (PHQ2-9): Low Risk  (08/23/2023)  Tobacco Use: Low Risk  (04/14/2024)     Readmission Risk Interventions    07/05/2022   11:29 AM 06/30/2022   12:52 PM 03/09/2022    9:54 AM  Readmission Risk Prevention Plan  Transportation Screening Complete Complete Complete  PCP or Specialist Appt within 5-7 Days  Complete   PCP or Specialist Appt within 3-5 Days Complete  Complete  Home Care Screening  Complete   Medication Review (RN CM)   Complete   HRI or Home Care Consult Complete  Not Complete  Social Work Consult for Recovery Care Planning/Counseling Complete  Complete  Palliative Care Screening Complete    Medication Review Oceanographer) Complete  Complete

## 2024-04-17 NOTE — Plan of Care (Signed)
 No active seizures. Awaiting placement   Problem: Education: seizure Problem: Skin Integrity: Goal: Risk for impaired skin integrity will decrease Outcome: Not Met (add Reason)   Goal: Knowledge of General Education information will improve Description: Including pain rating scale, medication(s)/side effects and non-pharmacologic comfort measures Outcome: Not Met (add Reason)   Problem: Elimination: Goal: Will not experience complications related to bowel motility Outcome: Not Met (add Reason) Goal: Will not experience complications related to urinary retention Outcome: Not Met (add Reason)   Problem: Coping: Goal: Level of anxiety will decrease Outcome: Not Met (add Reason)   Problem: Safety: Goal: Ability to remain free from injury will improve Outcome: Not Met (add Reason)

## 2024-04-17 NOTE — Discharge Summary (Signed)
 Physician Discharge Summary  Sarah Myers HQI:[id number] DOB: 1971-08-23  PCP: Karron Pagan, MD  Admitted from: SNF Discharged to: Return to SNF  Admit date: 04/14/2024 Discharge date: 04/17/2024  Recommendations for Outpatient Follow-up:    Follow-up Information     MD at SNF Follow up.   Why: To be seen in 3 to 4 days from hospital discharge with repeat labs (CBC & BMP).  Recommend outpatient Neurology follow-up regarding her seizures.        Karron Pagan, MD. Schedule an appointment as soon as possible for a visit.   Why: To be seen upon discharge from SNF or at [facility]. Contact information: [address] [place] [address] [phone]                  Home Health: None    Equipment/Devices: TBD at SNF    Discharge Condition: Improved and stable.   Code Status: Full Code Diet recommendation:  Discharge Diet Orders (From admission, onward)     Start     Ordered   04/17/24 0000  Diet - low sodium heart healthy       Comments: DIET DYS 3. 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  As per the report patient was given Versed  by the EMS.  Patient was hospitalized for further management.      Assessment &  Plan:    Seizure Patient was observed to have generalized tonic-clonic seizure per reports.  She lives in a nursing facility, long-term care.  As per neurology, history of 1 prior seizure. 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   Prior to admission patient noted to be on amantadine , Depakote , gabapentin , mirtazapine . Patient was initially started on valproate but due to drug to drug interactions, valproate was changed to Keppra .  Currently only on Keppra .   Neurology saw yesterday and signed off.  Will recommend continuing Keppra  as below.  Outpatient neurology follow-up. LTM EEG on 4/29: Showed 2 seizures without clinical signs arising from left temporal region at 5:13 AM and 7:24 AM, lasting for about 1 minute 10 seconds and 1 minute respectively.  LTM EEG on 4/30 suggestive of mild diffuse encephalopathy with no seizures. MRI brain 4/29 shows no acute intracranial abnormality.  Shows chronic left PCA infarct and remote right basal ganglia hemorrhage, moderate chronic small vessel ischemic disease and unchanged bilateral cribriform plate encephaloceles. On day of discharge, I discussed with Dr. Renaee Caro, Neurohospitalist, he was okay with resuming prior home meds including amantadine , baclofen , buspirone , Remeron  and tizanidine .   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  CT head did not show any acute  findings. Noted to have left-sided contractures.  Continue prior home chronic meds including Zanaflex  and baclofen .  Also on gabapentin  likely for neuropathic pain.   History of pulmonary embolism While patient was n.p.o. in the hospital, she was briefly on therapeutic dose subcutaneous Lovenox .  Once she was tolerating diet, she was transitioned back to her home dose of Eliquis .   Essential hypertension/refractory hypertension Given that patient is on 4 different antihypertensives,  qualifies as refractory hypertension.  Blood pressures slightly labile.  Continue home regimen as below with close outpatient follow-up and adjust antihypertensives as needed.  Could consider referral to [facility] run by Dr. Maudine Sos for further recommendations.   Mood disorder On review of home meds on 4/30, not on Wellbutrin .  Continue BuSpar , amantadine  and Remeron .   Dysphagia SLP input appreciated and recommended dysphagia 3 diet and thin liquids.  Tolerating well.  Continue.   Constipation LBM 4/28.  However pelvis x-ray on 4/29 showed stool ball distending the rectum.  Patient was written for Dulcolax suppository yesterday which she did not receive.  Although she had a BM on 5/1, given the imaging findings, will give a milk and molasses enema prior to discharge to SNF.  Continue MiraLAX .  Added Senokot-S and as needed Dulcolax suppositories to avoid constipation at SNF.   Thrombocytopenia Unclear etiology.  Stable.?  Related to Lovenox  which is being discontinued today.  Follow CBCs closely at SNF.Aaron Aas   Body mass index is 15.41 kg/m./Underweight OP follow up    Consultants:   Neurology   Procedures:   LTM EEG   Discharge Instructions  Discharge Instructions     Ambulatory referral to Neurology   Complete by: As directed    An appointment is requested in approximately: 4 weeks   Call MD for:   Complete by: As directed    Recurrent seizures.   Call MD for:  difficulty breathing, headache or visual disturbances   Complete by: As directed    Call MD for:  extreme fatigue   Complete by: As directed    Call MD for:  persistant dizziness or light-headedness   Complete by: As directed    Call MD for:  persistant nausea and vomiting   Complete by: As directed    Call MD for:  severe uncontrolled pain   Complete by: As directed    Call MD for:  temperature >100.4   Complete by: As directed    Diet - low sodium heart healthy   Complete by: As  directed    DIET DYS 3. Fluid consistency: Thin      Comments: Meds whole Intermittent supervision Must be fully alert for PO's   Discharge instructions   Complete by: As directed    Per [place]  DMV statutes, patients with seizures are not allowed to drive until they have been seizure-free for six months.  Use caution when using heavy equipment or power tools. Avoid working on ladders or at heights. Take showers instead of baths. Ensure the water  temperature is not too high on the home water  heater. Do not go swimming alone. Do not lock yourself in a room alone (i.e. bathroom). When caring for infants or small children, sit down when holding, feeding, or changing them to minimize risk of injury to the child in the event you have a seizure. To reduce risk of seizures, maintain good sleep hygiene avoid alcohol and illicit drug use, take all anti-seizure medications as prescribed.   If patient has another seizure,  call 911 and bring them back to the ED if: A.  The seizure lasts longer than 5 minutes.      B.  The patient doesn't wake shortly after the seizure or has new problems such as difficulty seeing, speaking or moving following the seizure C.  The patient was injured during the seizure D.  The patient has a temperature over 102 F (39C) E.  The patient vomited during the seizure and now is having trouble breathing   Driving Restrictions   Complete by: As directed    No driving until cleared by physician during outpatient office visit.   Increase activity slowly   Complete by: As directed         Medication List     STOP taking these medications    divalproex  250 MG 24 hr tablet Commonly known as: DEPAKOTE  ER       TAKE these medications    acetaminophen  500 MG tablet Commonly known as: TYLENOL  Take 1,000 mg by mouth every 6 (six) hours as needed (pain).   Amantadine  HCl 100 MG tablet Take 100 mg by mouth 2 (two) times daily.   amLODipine  10 MG tablet Commonly  known as: NORVASC  Take 1 tablet (10 mg total) by mouth daily.   apixaban  5 MG Tabs tablet Commonly known as: ELIQUIS  Take 1 tablet (5 mg total) by mouth 2 (two) times daily.   Baclofen  15 MG Tabs Take 15 mg by mouth 3 (three) times daily.   bisacodyl  10 MG suppository Commonly known as: DULCOLAX Place 1 suppository (10 mg total) rectally daily as needed for moderate constipation or mild constipation.   busPIRone  7.5 MG tablet Commonly known as: BUSPAR  Take 7.5 mg by mouth 2 (two) times daily.   carvedilol  6.25 MG tablet Commonly known as: COREG  Take 1 tablet (6.25 mg total) by mouth 2 (two) times daily with a meal.   cholecalciferol  25 MCG (1000 UNIT) tablet Commonly known as: VITAMIN D3 Take 1,000 Units by mouth daily.   famotidine  20 MG tablet Commonly known as: PEPCID  Take 1 tablet (20 mg total) by mouth 2 (two) times daily.   gabapentin  100 MG capsule Commonly known as: NEURONTIN  Take 1 capsule (100 mg total) by mouth 2 (two) times daily.   hydrALAZINE  25 MG tablet Commonly known as: APRESOLINE  Take 25 mg by mouth every 6 (six) hours.   levETIRAcetam  500 MG tablet Commonly known as: KEPPRA  Take 1 tablet (500 mg total) by mouth 2 (two) times daily.   losartan  25 MG tablet Commonly known as: COZAAR  Take 1 tablet (25 mg total) by mouth daily.   magnesium  gluconate 500 MG tablet Commonly known as: MAGONATE Take 1 tablet (500 mg total) by mouth at bedtime. What changed: when to take this   mirtazapine  7.5 MG tablet Commonly known as: REMERON  Take 7.5 mg by mouth at bedtime.   multivitamin with minerals Tabs tablet Take 1 tablet by mouth daily.   NON FORMULARY Take 1 Dose by mouth in the morning, at noon, and at bedtime. House supplement   NUTRITIONAL SUPPLEMENT PO Take 1 each by mouth 2 (two) times daily. AHR Frozen nutritional treat   ondansetron  4 MG tablet Commonly known as: ZOFRAN  Take 1 tablet (4 mg total) by mouth every 8 (eight) hours as needed  for nausea or vomiting.   polyethylene glycol 17 g packet Commonly known as: MIRALAX  / GLYCOLAX  Take 17 g by mouth daily.   saccharomyces boulardii 250 MG capsule Commonly known as: FLORASTOR  Take 250 mg by mouth daily.   senna-docusate 8.6-50 MG tablet Commonly known as: Senokot-S Take 1 tablet by mouth 2 (two) times daily.   tiZANidine  2 MG tablet Commonly known as: ZANAFLEX  Take 2 mg by mouth at bedtime.       Allergies  Allergen Reactions   Zestril  [Lisinopril ] Cough      Procedures/Studies: DG Pelvis Portable Result Date: 04/15/2024 CLINICAL DATA:  Left hip pain. EXAM: PORTABLE PELVIS 1-2 VIEWS COMPARISON:  None Available. FINDINGS: AP views of the pelvis submitted. Technically limited it difficulties with patient positioning. No evidence of acute fracture. Slight hip joint space narrowing. There is mild left acetabular spurring. No evidence of erosion, avascular necrosis or bone destruction. Pubic rami are intact. Pubic symphysis and sacroiliac joints are congruent. A stool ball distends the rectum. IMPRESSION: 1. Mild left hip degenerative change.  No acute findings. 2. Stool ball distends the rectum. Electronically Signed   By: Chadwick Colonel M.D.   On: 04/15/2024 16:47   MR BRAIN WO CONTRAST Result Date: 04/15/2024 CLINICAL DATA:  Seizure disorder, clinical change. EXAM: MRI HEAD WITHOUT CONTRAST TECHNIQUE: Multiplanar, multiecho pulse sequences of the brain and surrounding structures were obtained without intravenous contrast. COMPARISON:  Head CT 04/14/2024 and MRI 10/13/2023 FINDINGS: Brain: There is no evidence of an acute infarct, mass, midline shift, or extra-axial fluid collection. Hemosiderin stained encephalomalacia is noted in the right basal ganglia and surrounding white matter extending into the temporal lobe related to a remote hemorrhage, and there is associated wallerian degeneration in the brainstem. A moderate-sized chronic left occipital infarct is again  noted with ex vacuo dilatation of the occipital horn of the left lateral ventricle. T2 hyperintensities elsewhere in the cerebral white matter bilaterally are nonspecific but compatible with chronic small vessel ischemic disease, moderate for age. There is mild-to-moderate cerebral atrophy. There is mild asymmetric volume loss of the left hippocampus. An enlarged, partially empty sella and CSF enlargement of Meckel's caves bilaterally are unchanged and can be seen with intracranial hypertension. There are also unchanged bilateral cribriform plate encephaloceles extending into the ethmoid sinuses. Vascular: Major intracranial vascular flow voids are preserved. Skull and upper cervical spine: Unremarkable bone marrow signal. Sinuses/Orbits: Unremarkable orbits. Encephalocele is a as noted above. No evidence of acute inflammatory sinus disease. Clear mastoid air cells. Other: None. IMPRESSION: 1. No acute intracranial abnormality. 2. Chronic left PCA infarct and remote right basal ganglia hemorrhage. 3. Moderate chronic small vessel ischemic disease. 4. Unchanged bilateral cribriform plate encephaloceles. Electronically Signed   By: Aundra Lee M.D.   On: 04/15/2024 15:47   Overnight EEG with video Result Date: 04/15/2024 Arleene Lack, MD     04/16/2024  7:23 AM Patient Name: JENALYNN ILER MRN: [id number] Epilepsy Attending: Arleene Lack Referring Physician/Provider: Tona Francis, MD Duration: 04/14/2024 2145 to 04/15/2024 2350 Patient history:  53 y.o. female with above past medical history presenting for a seizure that lasted about 20 minutes at the facility.  Prior history of 1 seizure-like episode. EEG to evaluate for seizure Level of alertness: Awake, asleep AEDs during EEG study: LEV Technical aspects: This EEG study was done with scalp electrodes positioned according to the 10-20 International system of electrode placement. Electrical activity was reviewed with band pass filter of 1-70Hz ,  sensitivity of 7 uV/mm, display speed of 23mm/sec with a 60Hz  notched filter applied as appropriate. EEG data were recorded continuously and digitally stored.  Video monitoring was available and reviewed as appropriate. Description: The posterior dominant  rhythm consists of 8-9 Hz activity of moderate voltage (25-35 uV) seen predominantly in posterior head regions, symmetric and reactive to eye opening and eye closing. Sleep was characterized by vertex waves, sleep spindles (12 to 14 Hz), maximal frontocentral region. Hyperventilation and photic stimulation were not performed.   Two seizures without clinical signs were noted on 04/15/2024 at 0513 and 0724. During seizure EEG showed sharp waves in left temporal region followed by rhythmic 5 to 6 Hz theta-delta slowing which then involve all of left frontotemporal region. EEG then evolved into 2 to 3 Hz high amplitude sharply contoured delta slowing and involved left parieto-occipital region. Eventually EEG also involved right hemisphere. Around this time patient woke up but again no clinical signs were noted. Total duration of seizure was about 1 minute 10 seconds and 1 minute respectively. EEG was disconnected between 1312 to 1516 for mri brain ABNORMALITY - Seizure without clinical signs, left temporal region IMPRESSION: This study showed two seizures without clinical signs arising from left temporal region on/ 04/15/2024 at 0513 and 0724, lasting for about 1 minute 10 seconds and 1 minute respectively. Arleene Lack   CT HEAD WO CONTRAST Result Date: 04/14/2024 CLINICAL DATA:  Altered mental status. EXAM: CT HEAD WITHOUT CONTRAST TECHNIQUE: Contiguous axial images were obtained from the base of the skull through the vertex without intravenous contrast. RADIATION DOSE REDUCTION: This exam was performed according to the departmental dose-optimization program which includes automated exposure control, adjustment of the mA and/or kV according to patient size  and/or use of iterative reconstruction technique. COMPARISON:  Head CT dated 02/12/2024. FINDINGS: Brain: Mild age-related atrophy and chronic microvascular ischemic changes. Left occipital old infarct. There is no acute intracranial hemorrhage. No mass effect or midline shift no extra-axial fluid collection. Vascular: No hyperdense vessel or unexpected calcification. Skull: Normal. Negative for fracture or focal lesion. Sinuses/Orbits: No acute findings. 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 Other: None. IMPRESSION: 1. No acute intracranial pathology. 2. Mild age-related atrophy and chronic microvascular ischemic changes. Left occipital old infarct. Electronically Signed   By: Angus Bark M.D.   On: 04/14/2024 16:29   DG Chest Port 1 View Result Date: 04/14/2024 CLINICAL DATA:  Altered mental status.  Seizure-like activity. EXAM: PORTABLE CHEST 1 VIEW COMPARISON:  Radiographs 02/09/2024 and 11/10/2023.  CT 10/17/2022. FINDINGS: 1415 hours. The heart size and mediastinal contours are normal. The lungs are clear. There is no pleural effusion or pneumothorax. No acute osseous findings are identified. Telemetry leads overlie the chest. IMPRESSION: No evidence of acute cardiopulmonary process. Electronically Signed   By: Elmon Hagedorn M.D.   On: 04/14/2024 14:57      Subjective: Patient denies complaints.  Aware that she is being discharged today.  Discharge Exam:  Vitals:   04/17/24 0355 04/17/24 0739 04/17/24 0839 04/17/24 1204  BP: (!) 144/97 (!) 178/133 (!) 141/122 (!) 158/90  Pulse: 63 77 68 76  Resp: 13 14  18   Temp: 98.5 F (36.9 C) (!) 97.5 F (36.4 C)  97.6 F (36.4 C)  TempSrc: Oral Oral  Oral  SpO2: 100% 100%  100%  Weight:      Height:        General exam: Young female, moderately built and frail, sitting up comfortably in bed and eating breakfast with her right hand.  Appears to be enjoying her breakfast.   Respiratory system: Clear to auscultation.  No increased  work of breathing. Cardiovascular system: S1 & S2 heard, RRR. No JVD, murmurs, rubs, gallops  or clicks. No pedal edema.  Telemetry personally reviewed: Sinus rhythm. Gastrointestinal system: Abdomen is nondistended, soft and nontender. No organomegaly or masses felt. Normal bowel sounds heard. Central nervous system: Alert and oriented. No cranial nerve deficits. Extremities: Chronic left-sided hemiplegia with contractures.  Right limb power is grade 5 x 5 power Skin: No rashes, lesions or ulcers Psychiatry: Judgement and insight appear normal. Mood & affect appropriate.     The results of significant diagnostics from this hospitalization (including imaging, microbiology, ancillary and laboratory) are listed below for reference.     Microbiology: No results found for this or any previous visit (from the past 240 hours).   Labs: CBC: Recent Labs  Lab 04/14/24 1415 04/14/24 1425 04/15/24 0531 04/16/24 0621 04/17/24 0616  WBC  --  7.9 6.8 3.7* 4.6  NEUTROABS  --  7.2 4.9  --   --   HGB 13.9 13.7 14.2 12.0 12.8  HCT 41.0 43.3 43.0 36.4 37.8  MCV  --  88.4 85.5 86.1 85.3  PLT  --  114* 114* 119* 121*    Basic Metabolic Panel: Recent Labs  Lab 04/14/24 1415 04/14/24 1425 04/15/24 0531 04/16/24 0552  NA 139 138 137 139  K 4.7 4.8 4.2 3.8  CL  --  106 105 106  CO2  --  22 23 21*  GLUCOSE  --  128* 78 70  BUN  --  17 12 11   CREATININE  --  0.74 0.64 0.65  CALCIUM  --  10.2 10.5* 10.4*  MG  --   --  2.0  --     Liver Function Tests: Recent Labs  Lab 04/14/24 1425 04/15/24 0531  AST 23 19  ALT 12 16  ALKPHOS 57 65  BILITOT 0.6 0.7  PROT 7.1 7.5  ALBUMIN  3.4* 3.5    CBG: Recent Labs  Lab 04/16/24 1358 04/16/24 1743 04/17/24 0003 04/17/24 0613 04/17/24 1203  GLUCAP 113* 135* 129* 88 127*     Urinalysis    Component Value Date/Time   COLORURINE YELLOW 04/14/2024 1540   APPEARANCEUR CLEAR 04/14/2024 1540   LABSPEC 1.018 04/14/2024 1540   PHURINE 6.0  04/14/2024 1540   GLUCOSEU NEGATIVE 04/14/2024 1540   HGBUR NEGATIVE 04/14/2024 1540   BILIRUBINUR NEGATIVE 04/14/2024 1540   KETONESUR NEGATIVE 04/14/2024 1540   PROTEINUR 30 (A) 04/14/2024 1540   UROBILINOGEN 1.0 09/20/2015 1448   NITRITE NEGATIVE 04/14/2024 1540   LEUKOCYTESUR NEGATIVE 04/14/2024 1540   I discussed in detail with 1 of patient's daughters, updated care and answered all questions.  Initially, I was not able to reach the other daughter and had left a VM message.   Time coordinating discharge: 35 minutes  SIGNED:  Aubrey Blas, MD,  FACP, [facility], [facility], [facility]   [facility] [place]     To contact the attending provider between 7A-7P or the covering provider during after hours 7P-7A, please log into the web site [email or web address] and access using universal [place] password for that web site. If you do not have the password, please call the hospital operator.

## 2024-04-28 ENCOUNTER — Ambulatory Visit: Payer: Medicaid Other | Admitting: Psychology

## 2024-04-30 ENCOUNTER — Encounter: Payer: Medicaid Other | Attending: Physical Medicine and Rehabilitation | Admitting: Psychology

## 2024-04-30 DIAGNOSIS — R252 Cramp and spasm: Secondary | ICD-10-CM | POA: Insufficient documentation

## 2024-04-30 DIAGNOSIS — M79609 Pain in unspecified limb: Secondary | ICD-10-CM | POA: Insufficient documentation

## 2024-04-30 DIAGNOSIS — G4701 Insomnia due to medical condition: Secondary | ICD-10-CM | POA: Insufficient documentation

## 2024-04-30 DIAGNOSIS — I10 Essential (primary) hypertension: Secondary | ICD-10-CM | POA: Insufficient documentation

## 2024-06-02 ENCOUNTER — Observation Stay (HOSPITAL_COMMUNITY)

## 2024-06-02 ENCOUNTER — Emergency Department (HOSPITAL_COMMUNITY)

## 2024-06-02 ENCOUNTER — Encounter (HOSPITAL_COMMUNITY): Payer: Self-pay

## 2024-06-02 ENCOUNTER — Inpatient Hospital Stay (HOSPITAL_COMMUNITY)
Admission: EM | Admit: 2024-06-02 | Discharge: 2024-06-04 | DRG: 056 | Disposition: A | Discharge status: Skilled Nursing Facility | Source: Skilled Nursing Facility | Attending: Infectious Diseases | Admitting: Infectious Diseases

## 2024-06-02 ENCOUNTER — Other Ambulatory Visit: Payer: Self-pay

## 2024-06-02 DIAGNOSIS — Y92129 Unspecified place in nursing home as the place of occurrence of the external cause: Secondary | ICD-10-CM

## 2024-06-02 DIAGNOSIS — R569 Unspecified convulsions: Secondary | ICD-10-CM | POA: Diagnosis not present

## 2024-06-02 DIAGNOSIS — Z79899 Other long term (current) drug therapy: Secondary | ICD-10-CM

## 2024-06-02 DIAGNOSIS — R000 Tachycardia, unspecified: Secondary | ICD-10-CM | POA: Diagnosis present

## 2024-06-02 DIAGNOSIS — R4189 Other symptoms and signs involving cognitive functions and awareness: Secondary | ICD-10-CM | POA: Diagnosis present

## 2024-06-02 DIAGNOSIS — Z7901 Long term (current) use of anticoagulants: Secondary | ICD-10-CM

## 2024-06-02 DIAGNOSIS — I2489 Other forms of acute ischemic heart disease: Secondary | ICD-10-CM | POA: Diagnosis present

## 2024-06-02 DIAGNOSIS — X58XXXA Exposure to other specified factors, initial encounter: Secondary | ICD-10-CM | POA: Diagnosis present

## 2024-06-02 DIAGNOSIS — R29810 Facial weakness: Secondary | ICD-10-CM | POA: Diagnosis present

## 2024-06-02 DIAGNOSIS — J9811 Atelectasis: Secondary | ICD-10-CM | POA: Diagnosis present

## 2024-06-02 DIAGNOSIS — R300 Dysuria: Secondary | ICD-10-CM | POA: Diagnosis present

## 2024-06-02 DIAGNOSIS — Z833 Family history of diabetes mellitus: Secondary | ICD-10-CM

## 2024-06-02 DIAGNOSIS — R0902 Hypoxemia: Secondary | ICD-10-CM | POA: Diagnosis present

## 2024-06-02 DIAGNOSIS — G9389 Other specified disorders of brain: Secondary | ICD-10-CM | POA: Diagnosis present

## 2024-06-02 DIAGNOSIS — Z888 Allergy status to other drugs, medicaments and biological substances status: Secondary | ICD-10-CM

## 2024-06-02 DIAGNOSIS — R7989 Other specified abnormal findings of blood chemistry: Secondary | ICD-10-CM | POA: Diagnosis present

## 2024-06-02 DIAGNOSIS — Z8249 Family history of ischemic heart disease and other diseases of the circulatory system: Secondary | ICD-10-CM

## 2024-06-02 DIAGNOSIS — I10 Essential (primary) hypertension: Secondary | ICD-10-CM | POA: Diagnosis present

## 2024-06-02 DIAGNOSIS — I69298 Other sequelae of other nontraumatic intracranial hemorrhage: Secondary | ICD-10-CM

## 2024-06-02 DIAGNOSIS — Z86711 Personal history of pulmonary embolism: Secondary | ICD-10-CM

## 2024-06-02 DIAGNOSIS — Z823 Family history of stroke: Secondary | ICD-10-CM

## 2024-06-02 DIAGNOSIS — G40409 Other generalized epilepsy and epileptic syndromes, not intractable, without status epilepticus: Principal | ICD-10-CM | POA: Diagnosis present

## 2024-06-02 DIAGNOSIS — J690 Pneumonitis due to inhalation of food and vomit: Secondary | ICD-10-CM | POA: Diagnosis present

## 2024-06-02 DIAGNOSIS — R451 Restlessness and agitation: Secondary | ICD-10-CM | POA: Diagnosis not present

## 2024-06-02 DIAGNOSIS — R9431 Abnormal electrocardiogram [ECG] [EKG]: Secondary | ICD-10-CM | POA: Diagnosis present

## 2024-06-02 DIAGNOSIS — F32A Depression, unspecified: Secondary | ICD-10-CM | POA: Diagnosis present

## 2024-06-02 DIAGNOSIS — S0083XA Contusion of other part of head, initial encounter: Secondary | ICD-10-CM | POA: Diagnosis present

## 2024-06-02 DIAGNOSIS — F419 Anxiety disorder, unspecified: Secondary | ICD-10-CM | POA: Diagnosis present

## 2024-06-02 DIAGNOSIS — Z1152 Encounter for screening for COVID-19: Secondary | ICD-10-CM

## 2024-06-02 LAB — CBC WITH DIFFERENTIAL/PLATELET
Abs Immature Granulocytes: 0.04 K/uL (ref 0.00–0.07)
Basophils Absolute: 0 K/uL (ref 0.0–0.1)
Basophils Relative: 0 %
Eosinophils Absolute: 0 K/uL (ref 0.0–0.5)
Eosinophils Relative: 0 %
HCT: 46.6 % — ABNORMAL HIGH (ref 36.0–46.0)
Hemoglobin: 15.8 g/dL — ABNORMAL HIGH (ref 12.0–15.0)
Immature Granulocytes: 0 %
Lymphocytes Relative: 9 %
Lymphs Abs: 1.1 K/uL (ref 0.7–4.0)
MCH: 28.7 pg (ref 26.0–34.0)
MCHC: 33.9 g/dL (ref 30.0–36.0)
MCV: 84.7 fL (ref 80.0–100.0)
Monocytes Absolute: 0.3 K/uL (ref 0.1–1.0)
Monocytes Relative: 2 %
Neutro Abs: 10.4 K/uL — ABNORMAL HIGH (ref 1.7–7.7)
Neutrophils Relative %: 89 %
Platelets: 171 K/uL (ref 150–400)
RBC: 5.5 MIL/uL — ABNORMAL HIGH (ref 3.87–5.11)
RDW: 12.1 % (ref 11.5–15.5)
WBC: 11.7 K/uL — ABNORMAL HIGH (ref 4.0–10.5)
nRBC: 0 % (ref 0.0–0.2)

## 2024-06-02 LAB — RESP PANEL BY RT-PCR (RSV, FLU A&B, COVID)  RVPGX2
Influenza A by PCR: NEGATIVE
Influenza B by PCR: NEGATIVE
Resp Syncytial Virus by PCR: NEGATIVE
SARS Coronavirus 2 by RT PCR: NEGATIVE

## 2024-06-02 LAB — COMPREHENSIVE METABOLIC PANEL WITH GFR
ALT: 19 U/L (ref 0–44)
AST: 27 U/L (ref 15–41)
Albumin: 3.6 g/dL (ref 3.5–5.0)
Alkaline Phosphatase: 73 U/L (ref 38–126)
Anion gap: 11 (ref 5–15)
BUN: 13 mg/dL (ref 6–20)
CO2: 21 mmol/L — ABNORMAL LOW (ref 22–32)
Calcium: 10.4 mg/dL — ABNORMAL HIGH (ref 8.9–10.3)
Chloride: 108 mmol/L (ref 98–111)
Creatinine, Ser: 0.6 mg/dL (ref 0.44–1.00)
GFR, Estimated: >60 mL/min (ref >60)
Glucose, Bld: 83 mg/dL (ref 70–99)
Potassium: 4.1 mmol/L (ref 3.5–5.1)
Sodium: 140 mmol/L (ref 135–145)
Total Bilirubin: 0.9 mg/dL (ref 0.0–1.2)
Total Protein: 7.5 g/dL (ref 6.5–8.1)

## 2024-06-02 LAB — URINALYSIS, W/ REFLEX TO CULTURE (INFECTION SUSPECTED)
Bilirubin Urine: NEGATIVE
Glucose, UA: NEGATIVE mg/dL
Hgb urine dipstick: NEGATIVE
Ketones, ur: 5 mg/dL — AB
Leukocytes,Ua: NEGATIVE
Nitrite: NEGATIVE
Protein, ur: NEGATIVE mg/dL
Specific Gravity, Urine: 1.01 (ref 1.005–1.030)
pH: 7 (ref 5.0–8.0)

## 2024-06-02 LAB — TROPONIN I (HIGH SENSITIVITY)
Troponin I (High Sensitivity): 24 ng/L — ABNORMAL HIGH (ref <18)
Troponin I (High Sensitivity): 50 ng/L — ABNORMAL HIGH (ref <18)

## 2024-06-02 LAB — CK: Total CK: 181 U/L (ref 38–234)

## 2024-06-02 LAB — ETHANOL: Alcohol, Ethyl (B): <15 mg/dL (ref <15)

## 2024-06-02 LAB — MAGNESIUM: Magnesium: 1.8 mg/dL (ref 1.7–2.4)

## 2024-06-02 LAB — I-STAT CG4 LACTIC ACID, ED: Lactic Acid, Venous: 1 mmol/L (ref 0.5–1.9)

## 2024-06-02 LAB — LIPASE, BLOOD: Lipase: 31 U/L (ref 11–51)

## 2024-06-02 MED ORDER — LORAZEPAM 2 MG/ML IJ SOLN: 0.5000 mg | Freq: Once | INTRAMUSCULAR | Status: DC

## 2024-06-02 MED ORDER — APIXABAN 2.5 MG PO TABS
5.0000 mg | ORAL_TABLET | Freq: Two times a day (BID) | ORAL | Status: DC
  Administered 2024-06-03 – 2024-06-04 (×3): 5 mg via ORAL
  Filled 2024-06-02 (×3): qty 2

## 2024-06-02 MED ORDER — IOHEXOL 350 MG/ML SOLN
75.0000 mL | Freq: Once | INTRAVENOUS | Status: AC | PRN
  Administered 2024-06-02: 75 mL via INTRAVENOUS

## 2024-06-02 MED ORDER — LABETALOL HCL 5 MG/ML IV SOLN
10.0000 mg | Freq: Once | INTRAVENOUS | Status: AC
  Administered 2024-06-02: 10 mg via INTRAVENOUS
  Filled 2024-06-02: qty 4

## 2024-06-02 MED ORDER — SODIUM CHLORIDE 0.9 % IV BOLUS
500.0000 mL | Freq: Once | INTRAVENOUS | Status: AC
  Administered 2024-06-02: 500 mL via INTRAVENOUS

## 2024-06-02 MED ORDER — LEVETIRACETAM (KEPPRA) 500 MG/5 ML ADULT IV PUSH
750.0000 mg | Freq: Two times a day (BID) | INTRAVENOUS | Status: DC
  Administered 2024-06-02 – 2024-06-03 (×2): 750 mg via INTRAVENOUS
  Filled 2024-06-02 (×2): qty 10

## 2024-06-02 MED ORDER — MAGNESIUM SULFATE 2 GM/50ML IV SOLN
2.0000 g | Freq: Once | INTRAVENOUS | Status: AC
  Administered 2024-06-02: 2 g via INTRAVENOUS
  Filled 2024-06-02: qty 50

## 2024-06-02 MED ORDER — PIPERACILLIN-TAZOBACTAM 3.375 G IVPB 30 MIN
3.3750 g | Freq: Once | INTRAVENOUS | Status: AC
  Administered 2024-06-02: 3.375 g via INTRAVENOUS
  Filled 2024-06-02: qty 50

## 2024-06-02 MED ORDER — LORAZEPAM 2 MG/ML IJ SOLN
0.5000 mg | Freq: Once | INTRAMUSCULAR | Status: AC
  Administered 2024-06-02: 0.5 mg via INTRAVENOUS
  Filled 2024-06-02: qty 1

## 2024-06-02 MED ORDER — LEVETIRACETAM (KEPPRA) 500 MG/5 ML ADULT IV PUSH
1000.0000 mg | Freq: Once | INTRAVENOUS | Status: AC
  Administered 2024-06-02: 1000 mg via INTRAVENOUS
  Filled 2024-06-02: qty 10

## 2024-06-02 MED ORDER — LORAZEPAM 2 MG/ML IJ SOLN: 0.5000 mg | INTRAMUSCULAR | Status: DC | PRN

## 2024-06-02 NOTE — ED Notes (Signed)
 EEG tech at bedside.

## 2024-06-02 NOTE — ED Notes (Signed)
 Pt refused to cooperate with bedside swallow eval.

## 2024-06-02 NOTE — H&P (Signed)
 Date: 06/02/2024               Patient Name:  Sarah Myers MRN: [id number]  DOB: Jan 15, 1971 Age / Sex: 53 y.o., female   PCP: Karron Pagan, MD         Medical Service: Internal Medicine Teaching Service         Attending Physician: Dr. Alwin Baars Aretha Kubas, MD      First Contact: Intern on Call: [phone]       Second Contact: Resident on Call:[phone]                    SUBJECTIVE   Chief Complaint: Seizure   History of Present Illness: Sarah Myers is a 53 year old female with a past medical history of Nontraumatic intracranial hemorrhage with seizure disorder, spastic hemiplegia, and cognitive deficits, History of PE on Eliquis , Hypertension, Anxiety and mood disorder who presented to the emergency department for a seizure.    Per her SNF facility, she seizure like activity at some point overnight/early morning. They deny missed Keppra  doses and denies drug or alcohol use. At baseline she is oriented to self and place with +/- hallucinations.   She denied shortness of breath, chest pain, abdominal pain, cough, fever, and chills. She did report dysuria.    Review of Systems: A complete ROS was negative except as per HPI.   ED Course: Patient was brought in by EMS for a seizure. In the ED, she was awake and agitated requiring a dose of ativan . A chest CT showed patchy ground-glass opacities at both lung bases, nonspecific but possibly infectious or inflammatory and she was given a dose of zosyn . A troponin was elevated at 24 and increased to 50. EKG in the ED showed sinus tachycardia without ST segment changes.   Past Medical History: Nontraumatic intracranial hemorrhage with seizure disorder, spastic hemiplegia, and cognitive deficits  History of PE on Eliquis  Hypertension Anxiety and mood disorder   Meds:  Norvasc  10 mg  Vitamin D  1,000 daily Losartan  25 mg one tablet Magnesium  gluconate 500 mg nightly Remeron  7.5 mg nightly  Multivitamin 1  tablet daily Miralax  17 g daily  Zanaflex  2 mg at bedtime  Eliquis  5 mg BID  Amantadine  100 mg BID  Buspar  7.5 mg BID  Coreg  6.25 mg BID  Pepcid  20 mg BID  Gabapentin  100 mg BID  Keppra  500 mg BID  Senna one tablet BID  Baclofen  15 mg TID Hydralazine  25 mg four times a day  Tylenol  500 mg  Doculax suppository  Melatonin 3mg   Zofran  4mg    Allergies: Allergies as of 06/02/2024 - Review Complete 06/02/2024  Allergen Reaction Noted   Zestril  [lisinopril ] Cough 10/26/2019    Past Surgical History:  Procedure Laterality Date   BREAST LUMPECTOMY  1992   CESAREAN SECTION     IR GASTROSTOMY TUBE MOD SED  03/06/2022   ORIF RADIAL FRACTURE  02/10/2012   Procedure: OPEN REDUCTION INTERNAL FIXATION (ORIF) RADIAL FRACTURE;  Surgeon: Jasmine Mesi, MD;  Location: [facility] OR;  Service: Orthopedics;  Laterality: Right;   ORIF ULNAR FRACTURE  02/10/2012   Procedure: OPEN REDUCTION INTERNAL FIXATION (ORIF) ULNAR FRACTURE;  Surgeon: Jasmine Mesi, MD;  Location: [facility] OR;  Service: Orthopedics;  Laterality: Right;   TUBAL LIGATION      Social:  Lives With: At a SNF, [facility]  AOZ:HYQMV, Loura Rower, MD Substances:No tobacco, alcohol, or illicit drug use   Family History:  No  family history of seizure disorders.  Both parents had HTN.    OBJECTIVE:   Physical Exam: Blood pressure (!) 152/95, pulse 80, temperature 98.2 F (36.8 C), temperature source Axillary, resp. rate 19, height 5' 5 (1.651 m), weight 42.2 kg, SpO2 99%.  Constitutional:chronically ill, laying in bed, somnolent  HENT: edema/contusion present on R forehead, mucous membranes moist, no tongue biting appreciated (limited exam)  Cardiovascular: regular rate and rhythm, no m/r/g Pulmonary/Chest: normal work of breathing on room air, limited exam due to patient position and participation lungs clear to auscultation bilaterally. No crackles  Abdominal: soft, non-tender, non-distended Neurological: Follows  commands, limited participation in conversation, left arm spacticity  MSK: No pitting edema Skin: warm and dry  Labs:    Latest Ref Rng & Units 06/02/2024    5:13 AM 04/17/2024    6:16 AM 04/16/2024    6:21 AM  CBC  WBC 4.0 - 10.5 K/uL 11.7  4.6  3.7   Hemoglobin 12.0 - 15.0 g/dL 78.2  95.6  21.3   Hematocrit 36.0 - 46.0 % 46.6  37.8  36.4   Platelets 150 - 400 K/uL 171  121  119        Latest Ref Rng & Units 06/02/2024    6:50 AM 04/16/2024    5:52 AM 04/15/2024    5:31 AM  CMP  Glucose 70 - 99 mg/dL 83  70  78   BUN 6 - 20 mg/dL 13  11  12    Creatinine 0.44 - 1.00 mg/dL 0.86  5.78  4.69   Sodium 135 - 145 mmol/L 140  139  137   Potassium 3.5 - 5.1 mmol/L 4.1  3.8  4.2   Chloride 98 - 111 mmol/L 108  106  105   CO2 22 - 32 mmol/L 21  21  23    Calcium 8.9 - 10.3 mg/dL 62.9  52.8  41.3   Total Protein 6.5 - 8.1 g/dL 7.5   7.5   Total Bilirubin 0.0 - 1.2 mg/dL 0.9   0.7   Alkaline Phos 38 - 126 U/L 73   65   AST 15 - 41 U/L 27   19   ALT 0 - 44 U/L 19   16       Imaging: CT ABDOMEN PELVIS W CONTRAST Result Date: 06/02/2024 CLINICAL DATA:  Abdominal pain, acute, nonlocalized Recent seizure. EXAM: CT ABDOMEN AND PELVIS WITH CONTRAST TECHNIQUE: Multidetector CT imaging of the abdomen and pelvis was performed using the standard protocol following bolus administration of intravenous contrast. RADIATION DOSE REDUCTION: This exam was performed according to the departmental dose-optimization program which includes automated exposure control, adjustment of the mA and/or kV according to patient size and/or use of iterative reconstruction technique. CONTRAST:  75mL OMNIPAQUE  IOHEXOL  350 MG/ML SOLN COMPARISON:  Abdominopelvic CT 10/13/2023 and 02/05/2023. FINDINGS: Technical note: Examination was performed with the patient in the left lateral decubitus position. Lower chest: Patchy ground-glass opacities at both lung bases with probable linear atelectasis in the left lower lobe. No confluent  airspace disease or significant pleural effusion. Hepatobiliary: The liver is normal in density without suspicious focal abnormality. No evidence of gallstones, gallbladder wall thickening or biliary dilatation. Pancreas: Unremarkable. No pancreatic ductal dilatation or surrounding inflammatory changes. Spleen: Normal in size without focal abnormality. Adrenals/Urinary Tract: Both adrenal glands appear normal. No evidence of urinary tract calculus, suspicious renal lesion or hydronephrosis. Stable small cyst in the upper pole of the left kidney for which no specific  follow-up imaging is recommended. The bladder appears unremarkable for its degree of distention. Stomach/Bowel: No enteric contrast administered. The stomach appears unremarkable for its degree of distension. No evidence of bowel wall thickening, distention or surrounding inflammatory change. Interval improved colonic stool burden with less prominent stool in the rectum. Mild distal colonic diverticulosis. Vascular/Lymphatic: There are no enlarged abdominal or pelvic lymph nodes. Minimal iliac atherosclerosis. No acute vascular findings. Reproductive: The uterus and ovaries appear unremarkable. No adnexal mass. Other: No evidence of abdominal wall mass or hernia. No ascites or pneumoperitoneum. Musculoskeletal: No acute or significant osseous findings. IMPRESSION: 1. No acute findings or explanation for the patient's symptoms in the abdomen or pelvis. 2. Interval improved colonic stool burden. 3. Patchy ground-glass opacities at both lung bases, nonspecific but possibly infectious or inflammatory. Electronically Signed   By: Elmon Hagedorn M.D.   On: 06/02/2024 10:07   CT Head Wo Contrast Result Date: 06/02/2024 CLINICAL DATA:  53 year old female with witnessed seizure. Altered mental status. History of previous intracranial hemorrhage and infarct. EXAM: CT HEAD WITHOUT CONTRAST TECHNIQUE: Contiguous axial images were obtained from the base of the  skull through the vertex without intravenous contrast. RADIATION DOSE REDUCTION: This exam was performed according to the departmental dose-optimization program which includes automated exposure control, adjustment of the mA and/or kV according to patient size and/or use of iterative reconstruction technique. COMPARISON:  Brain MRI 04/15/2024, and earlier. FINDINGS: Brain: Chronic encephalomalacia in the left occipital lobe, dorsal right thalamus. Stable cerebral volume. No midline shift, ventriculomegaly, mass effect, evidence of mass lesion, intracranial hemorrhage or evidence of cortically based acute infarction. Vascular: No suspicious intracranial vascular hyperdensity. Mild basal ganglia vascular calcifications again noted. Skull: Stable.  No acute osseous abnormality identified. 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 Otherwise Visualized paranasal sinuses and mastoids are stable and well aerated. Other: Broad-based right forehead soft tissue swelling on series 3, image 40 is new. No scalp soft tissue gas. Visualized orbit soft tissues are within normal limits. IMPRESSION: 1. Right forehead scalp soft tissue swelling. No skull fracture identified. 2. No acute intracranial abnormality. Chronic encephalomalacia in the left occipital lobe, dorsal right thalamus. 3. Chronic calcified cribriform plate encephaloceles. Electronically Signed   By: Marlise Simpers M.D.   On: 06/02/2024 09:47   DG Chest Port 1 View Result Date: 06/02/2024 CLINICAL DATA:  53 year old female with altered mental status and seizure. EXAM: PORTABLE CHEST 1 VIEW COMPARISON:  Portable chest 04/14/2024 and earlier. FINDINGS: Portable AP supine view at 0604 hours. Lung volumes are stable, within normal limits. Normal cardiac size and mediastinal contours. Visualized tracheal air column is within normal limits. Allowing for portable technique the lungs are clear. No  pneumothorax or pleural effusion. No acute osseous abnormality identified. Negative visible bowel gas. IMPRESSION: Negative portable chest. Electronically Signed   By: Marlise Simpers M.D.   On: 06/02/2024 06:22     EKG: personally reviewed my interpretation is sinus tachycardia with a prolonged Qtc of 630. Prior EKG shows NSR.   ASSESSMENT & PLAN:   Assessment & Plan by Problem: Principal Problem:   Post-ictal state (HCC)   Sarah Myers is a 53 y.o. person living with a history of Nontraumatic intracranial hemorrhage with seizure disorder, spastic hemiplegia, and cognitive deficits, History of PE on Eliquis , Hypertension, Anxiety and mood disorder who presented with seizures and admitted for post-ictal state on hospital day 0  Post- Ictal State  Seizure Disorder 2/2 Nontraumatic  intracranial hemorrhage Patient presented after witnessed seizure like activities that resolved by the time of arrival. Patient then became agitated and required ativan  IV and is now post-ictal vs somnolent from the ativan . I suspect that her seizure was due to a break through seizure from recent changes in AED regimen to monotherapy with Keppra  500 mg BID.  Seizures are less likely due to intracranial pathologies with the CT of the head showing no acute intracranial abnormality. Noncompliance and substance use are less likely the etiology of the seizure, as the patient is in a facility which would make both of those causes very unlikely.  Plan: -Ativan  0.5 mg Q4 prn for abortive therapy  -Keppra  1,000 mg loading dose, will start 750 mg Keppra  this evening   -Seizure precautions  -EEG -CK ordered  -Neurology consulted  -PT/OT tomorrow   Aspiration Pneumonitis  CT imaging showed patchy ground-glass opacities at both lung bases. Patient is saturating well on room air, afebrile, and does not have additional signs of pneumonia. I suspect that the lung image findings are due to either atelectasis vs aspiration  pneumonitis.  Plan: -Hold off on additional antibiotics at this time  -Incentive spirometry  -CBC tomorrow morning   Elevated Troponin Suspect that this is due to demand ischemia secondary to hypoxia from the seizure. EKG does not show signs of ischemia at this time.  Plan: -Troponin ordered, will trend until flat   Prolonged Qtc Qtc on EKG at admission was .  -Will replace magnesium  -Repeat EKG -Magnesium  and BMP in the morning   Dysuria Will order U/A to assess for UTI  History of PE on eliquis   Patient is on a regimen of eliquis  5 mg BID. Will continue once patient passes bed side swallow evaluation.    Anxiety, Depression, Mood Disorder  Patient is on a regimen of Remeron  7.5 mg nightly, Amantadine  100 mg BID , Buspar  7.5 mg BID , Gabapentin  100 mg BID, Baclofen  15 mg TID, Melatonin 3 mg, Zanaflex  2 mg at bedtime. Will hold off on restarting sedating medications at this time.   Hypertension On a home regimen of Losartan  25 mg, Hydralazine  25 mg four times a day, Coreg  6.25 mg BID, and amlodipine  10 mg. Will hold while patient is NPO.    Diet: NPO VTE: NOAC Code: Full  Prior to Admission Living Arrangement: SNF Anticipated Discharge Location: SNF Barriers to Discharge: Resolution of post-ictal state and evaluation of seizure   Dispo: Admit patient to Observation with expected length of stay less than 2 midnights.  Signed:   Aurora Lees, DO Internal Medicine Resident PGY-1 06/02/2024, 3:37 PM   Please contact the on call pager at [phone].

## 2024-06-02 NOTE — ED Notes (Signed)
 Unable to obtain blood cultures due to patient being a hard stick. MD notified

## 2024-06-02 NOTE — ED Provider Notes (Signed)
 Pt signed out by Dr. Monique Ano pending labs and CT.  CBC nl, CMP nl, lip nl, mg nl, ua neg, trop sl elevated at 24 and second trop 50  CT head: . Right forehead scalp soft tissue swelling. No skull fracture  identified.  2. No acute intracranial abnormality. Chronic encephalomalacia in  the left occipital lobe, dorsal right thalamus.  3. Chronic calcified cribriform plate encephaloceles.    CT abd/pelvis:   No acute findings or explanation for the patient's symptoms in  the abdomen or pelvis.  2. Interval improved colonic stool burden.  3. Patchy ground-glass opacities at both lung bases, nonspecific but  possibly infectious or inflammatory.    Blood cultures, lactic acid, SARS ordered.  Lactic nl. Covid/flu/rsv neg.  IV zosyn  given due to possibility of aspiration pna.    Elevated trop likely due to strain.    Seizure:  pt remains asleep.  She was awake and agitated earlier, but she has not returned to normal mental status.  Pt given 1g keppra  iv.  Pt d/w IMTS for admission.  I updated her daughter via telephone.  Pt d/w Dr. Murvin Arthurs (neurology) who will see pt in consult.   Sueellen Emery, MD 06/02/24 1304

## 2024-06-02 NOTE — ED Triage Notes (Signed)
 Pt complaining of having a seizure tonight at the nursing home. Roommate saw the seizure.

## 2024-06-02 NOTE — ED Notes (Signed)
 Pt pulled out iv and had to restart

## 2024-06-02 NOTE — ED Notes (Addendum)
 Pt's IV removed when RN came in to give her Keppra . RN started another IV and pt changed and cleaned pt. Educated on fact that IV was necessary for hospitalization. Pt voiced agreement

## 2024-06-02 NOTE — ED Provider Notes (Signed)
 [facility] EMERGENCY DEPARTMENT AT [facility] Provider Note   CSN: [id number] Arrival date & time: 06/02/24  0501     Patient presents with: Seizures   Sarah Myers is a 53 y.o. female.   The history is provided by the patient, the EMS personnel and medical records.  Seizures Sarah Myers is a 53 y.o. female who presents to the Emergency Department complaining of seizures.  Presents to the emergency department by EMS from [facility] skilled nursing facility for evaluation of seizure activity.  Staff was notified at the facility of seizure activity per roommate.  When staff arrived patient was not seizing but then developed 2 minutes of generalized tonic-clonic activity.  Seizure activity had resolved at time of EMS arrival but patient was postictal and combative.  Roommate at the facility stated that patient has been complaining of abdominal pain for the last few days.  Additional history is not available at time of initial ED assessment.     Prior to Admission medications   Medication Sig Start Date End Date Taking? Authorizing Provider  acetaminophen  (TYLENOL ) 500 MG tablet Take 1,000 mg by mouth every 6 (six) hours as needed (pain).    [provider]  Amantadine  HCl 100 MG tablet Take 100 mg by mouth 2 (two) times daily. 09/15/22   [provider]  amLODipine  (NORVASC ) 10 MG tablet Take 1 tablet (10 mg total) by mouth daily. 06/22/22   Raulkar, Keven Pel, MD  apixaban  (ELIQUIS ) 5 MG TABS tablet Take 1 tablet (5 mg total) by mouth 2 (two) times daily. 06/22/22   Raulkar, Keven Pel, MD  Baclofen  15 MG TABS Take 15 mg by mouth 3 (three) times daily.    [provider]  bisacodyl  (DULCOLAX) 10 MG suppository Place 1 suppository (10 mg total) rectally daily as needed for moderate constipation or mild constipation. 04/17/24   Hongalgi, Anand D, MD  busPIRone  (BUSPAR ) 7.5 MG tablet Take 7.5 mg by mouth 2 (two) times daily.    [provider]  carvedilol  (COREG ) 6.25 MG tablet Take 1 tablet (6.25 mg total) by mouth 2 (two) times daily with a meal. 06/22/22   Raulkar, Keven Pel, MD  cholecalciferol  (VITAMIN D3) 25 MCG (1000 UNIT) tablet Take 1,000 Units by mouth daily.    [provider]  famotidine  (PEPCID ) 20 MG tablet Take 1 tablet (20 mg total) by mouth 2 (two) times daily. 05/22/22   Raulkar, Keven Pel, MD  gabapentin  (NEURONTIN ) 100 MG capsule Take 1 capsule (100 mg total) by mouth 2 (two) times daily. 05/24/22   Raulkar, Keven Pel, MD  hydrALAZINE  (APRESOLINE ) 25 MG tablet Take 25 mg by mouth every 6 (six) hours. 10/10/22   [provider]  levETIRAcetam  (KEPPRA ) 500 MG tablet Take 1 tablet (500 mg total) by mouth 2 (two) times daily. 04/17/24   Hongalgi, Anand D, MD  losartan  (COZAAR ) 25 MG tablet Take 1 tablet (25 mg total) by mouth daily. 06/09/22   Raulkar, Keven Pel, MD  magnesium  gluconate (MAGONATE) 500 MG tablet Take 1 tablet (500 mg total) by mouth at bedtime. Patient taking differently: Take 500 mg by mouth daily. 05/22/22   Raulkar, Keven Pel, MD  mirtazapine  (REMERON ) 7.5 MG tablet Take 7.5 mg by mouth at bedtime.    [provider]  Multiple Vitamin (MULTIVITAMIN WITH MINERALS) TABS tablet Take 1 tablet by mouth daily. 05/22/22   Raulkar, Keven Pel, MD  NON FORMULARY Take 1 Dose by mouth in  the morning, at noon, and at bedtime. House supplement    [provider]  Nutritional Supplements (NUTRITIONAL SUPPLEMENT PO) Take 1 each by mouth 2 (two) times daily. [facility] Frozen nutritional treat    [provider]  ondansetron  (ZOFRAN ) 4 MG tablet Take 1 tablet (4 mg total) by mouth every 8 (eight) hours as needed for nausea or vomiting. 10/17/22   Tegeler, Marine Sia, MD  polyethylene glycol (MIRALAX  / GLYCOLAX ) 17 g packet Take 17 g by mouth daily. 10/17/23   Ozell Blunt, MD  saccharomyces boulardii (FLORASTOR) 250 MG capsule Take 250 mg by mouth daily.    [provider]  senna-docusate (SENOKOT-S) 8.6-50 MG tablet Take 1 tablet by mouth 2 (two) times daily. 04/17/24   Hongalgi, Anand D, MD  tiZANidine  (ZANAFLEX ) 2 MG tablet Take 2 mg by mouth at bedtime.    [provider]    Allergies: Zestril  [lisinopril ]    Review of Systems  Neurological:  Positive for seizures.  All other systems reviewed and are negative.   Updated Vital Signs There were no vitals taken for this visit.  Physical Exam Vitals and nursing note reviewed.  Constitutional:      Appearance: She is well-developed.  HENT:     Head: Normocephalic and atraumatic.   Cardiovascular:     Rate and Rhythm: Regular rhythm. Tachycardia present.     Heart sounds: No murmur heard. Pulmonary:     Effort: Pulmonary effort is normal. No respiratory distress.     Breath sounds: Normal breath sounds.  Abdominal:     Palpations: Abdomen is soft.     Tenderness: There is no guarding or rebound.     Comments: Mild generalized abdominal tenderness   Musculoskeletal:        General: No tenderness.   Skin:    General: Skin is warm and dry.   Neurological:     Mental Status: She is alert.     Comments: Minimally verbal.  She does spontaneously move the right arm and right leg, right upper extremity greater than right lower extremity.  There are contractures to the left upper extremity and left lower extremity.  She is able to say her stomach hurts.  Psychiatric:        Behavior: Behavior normal.     (all labs ordered are listed, but only abnormal results are displayed) Labs Reviewed - No data to display  EKG: None  Radiology: No results found.   Procedures   Medications Ordered in the ED - No data to display                                  Medical Decision Making Amount and/or Complexity of Data Reviewed Labs: ordered. Radiology: ordered.   Patient with history of prior CVA, seizure disorder patient with history of prior CVA with left sided deficits, seizure  disorder, PE on anticoagulation here for evaluation of seizure episodes x 2, combative and postictal for EMS.  On evaluation patient is postictal.  She does complain of abdominal pain.  Plan to obtain labs, imaging to further evaluate her symptoms.  Patient care transferred pending additional workup.     Final diagnoses:  None    ED Discharge Orders     None          Kelsey Patricia, MD 06/02/24 [phone]

## 2024-06-02 NOTE — ED Notes (Signed)
 Pt had IV in right thumb which she removed herself. Bleeding controlled

## 2024-06-02 NOTE — Hospital Course (Signed)
 On a lower dose, was supposed to be a higher dose (on 500 mg BID) Eliquis   First one since she's been on medicine    [facility]   CVA  HTN  No cough or cold/ viruses   No recent seizures... one month prior  N/A  No tobCCO, a, d USE   WJ:XBJY   Needs to see neuro: July 17th   Neuro already saw her

## 2024-06-02 NOTE — Consult Note (Signed)
 NEUROLOGY CONSULT NOTE   Date of service: June 02, 2024 Patient Name: Sarah Myers MRN:  [id number] DOB:  1971-06-29 Chief Complaint: Seizures Requesting Provider: Sandie Cross, MD  History of Present Illness  Sarah Myers is a 53 y.o. 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  Patient's daughter is at the bedside and reports that she saw her mom last week and she seemed to be at her baseline.  She got a call this morning from the staff at the facility that the patient was transferred to ED for a seizure.  Apparently, seizure was witnessed by the roommate and staff was called and then patient had a 2-minute episode of generalized tonic-clonic seizure-like activity.  EMS were called with the seizure resolved by the time of EMS arrival.  Patient was postictal and combative and was brought into the ED for further evaluation workup.  In the ED, patient was noted to have a right forehead scalp soft tissue swelling and further imaging with a CT head was obtained which was negative for an acute intracranial normality.  Patient was admitted for elevated troponins.  Neurology was consulted to assist with seizure management.  On my evaluation, patient is somnolent.  She is moving her right side spontaneously and she is able to make eye contact, follow simple one-step commands.  She has contracture left upper extremity.  She withdraws bilateral lower extremities to Babinski's.    ROS  Unable to ascertain due to drowsy and postictal.  Past History   Past Medical History:  Diagnosis Date   Hypertension    Stroke [facility])     Past Surgical History:  Procedure Laterality Date   BREAST LUMPECTOMY  1992   CESAREAN SECTION     IR GASTROSTOMY TUBE MOD SED  03/06/2022    ORIF RADIAL FRACTURE  02/10/2012   Procedure: OPEN REDUCTION INTERNAL FIXATION (ORIF) RADIAL FRACTURE;  Surgeon: Jasmine Mesi, MD;  Location: [facility] OR;  Service: Orthopedics;  Laterality: Right;   ORIF ULNAR FRACTURE  02/10/2012   Procedure: OPEN REDUCTION INTERNAL FIXATION (ORIF) ULNAR FRACTURE;  Surgeon: Jasmine Mesi, MD;  Location: [facility] OR;  Service: Orthopedics;  Laterality: Right;   TUBAL LIGATION      Family History: Family History  Problem Relation Age of Onset   Diabetes Mother    Hypertension Mother    Heart disease Mother    Diabetes Maternal Aunt    Stroke Maternal Uncle    Diabetes Maternal Grandmother    Heart disease Maternal Grandmother    Diabetes Maternal Grandfather    Heart disease Maternal Grandfather    Heart disease Paternal Grandmother    Heart disease Paternal Grandfather     Social History  reports that she has never smoked. She has never used smokeless tobacco. She reports that she does not currently use alcohol. She reports that she does not use drugs.  Allergies  Allergen Reactions   Zestril  [Lisinopril ] Cough    Medications   Current Facility-Administered Medications:    apixaban  (ELIQUIS ) tablet 5 mg, 5 mg, Oral, BID, Bender, Emily, DO   LORazepam  (ATIVAN ) injection 0.5 mg, 0.5 mg, Intravenous, Q4H PRN, Aurora Lees, DO  Current Outpatient Medications:    acetaminophen  (TYLENOL ) 500 MG tablet, Take 1,000 mg by mouth  every 6 (six) hours as needed (pain)., Disp: , Rfl:    amantadine  (SYMMETREL ) 100 MG capsule, Take 100 mg by mouth 2 (two) times daily., Disp: , Rfl:    amLODipine  (NORVASC ) 10 MG tablet, Take 1 tablet (10 mg total) by mouth daily., Disp: 90 tablet, Rfl: 3   apixaban  (ELIQUIS ) 5 MG TABS tablet, Take 1 tablet (5 mg total) by mouth 2 (two) times daily., Disp: 60 tablet, Rfl: 1   Baclofen  15 MG TABS, Take 15 mg by mouth 3 (three) times daily., Disp: , Rfl:    bisacodyl  (DULCOLAX) 10 MG suppository, Place 1 suppository (10 mg  total) rectally daily as needed for moderate constipation or mild constipation., Disp: , Rfl:    busPIRone  (BUSPAR ) 7.5 MG tablet, Take 7.5 mg by mouth 2 (two) times daily., Disp: , Rfl:    carvedilol  (COREG ) 6.25 MG tablet, Take 1 tablet (6.25 mg total) by mouth 2 (two) times daily with a meal., Disp: 180 tablet, Rfl: 3   cholecalciferol  (VITAMIN D3) 25 MCG (1000 UNIT) tablet, Take 1,000 Units by mouth daily., Disp: , Rfl:    famotidine  (PEPCID ) 20 MG tablet, Take 1 tablet (20 mg total) by mouth 2 (two) times daily., Disp: 60 tablet, Rfl: 0   gabapentin  (NEURONTIN ) 100 MG capsule, Take 1 capsule (100 mg total) by mouth 2 (two) times daily., Disp: 60 capsule, Rfl: 0   hydrALAZINE  (APRESOLINE ) 25 MG tablet, Take 25 mg by mouth every 6 (six) hours., Disp: , Rfl:    levETIRAcetam  (KEPPRA ) 500 MG tablet, Take 1 tablet (500 mg total) by mouth 2 (two) times daily., Disp: , Rfl:    losartan  (COZAAR ) 25 MG tablet, Take 1 tablet (25 mg total) by mouth daily., Disp: 90 tablet, Rfl: 3   magnesium  gluconate (MAGONATE) 500 MG tablet, Take 1 tablet (500 mg total) by mouth at bedtime., Disp: 30 tablet, Rfl: 0   melatonin 3 MG TABS tablet, Take 3 mg by mouth at bedtime as needed (sleep)., Disp: , Rfl:    mirtazapine  (REMERON ) 7.5 MG tablet, Take 7.5 mg by mouth at bedtime., Disp: , Rfl:    Multiple Vitamin (MULTIVITAMIN WITH MINERALS) TABS tablet, Take 1 tablet by mouth daily., Disp: 30 tablet, Rfl: 0   Nutritional Supplements (NUTRITIONAL DRINK) LIQD, Take 240 mLs by mouth 2 (two) times daily. House supplement, Disp: , Rfl:    ondansetron  (ZOFRAN ) 4 MG tablet, Take 1 tablet (4 mg total) by mouth every 8 (eight) hours as needed for nausea or vomiting., Disp: 12 tablet, Rfl: 0   polyethylene glycol (MIRALAX  / GLYCOLAX ) 17 g packet, Take 17 g by mouth daily., Disp: , Rfl:    saccharomyces boulardii (FLORASTOR) 250 MG capsule, Take 250 mg by mouth daily., Disp: , Rfl:    senna-docusate (SENOKOT-S) 8.6-50 MG tablet,  Take 1 tablet by mouth 2 (two) times daily., Disp: , Rfl:    tiZANidine  (ZANAFLEX ) 2 MG tablet, Take 2 mg by mouth at bedtime., Disp: , Rfl:   Vitals   Vitals:   06/02/24 1001 06/02/24 1030 06/02/24 1100 06/02/24 1130  BP: (!) 170/102 (!) 161/105 (!) 160/138 (!) 152/95  Pulse: 99 94 82 80  Resp: 20 17 17 19   Temp: 98.2 F (36.8 C)     TempSrc: Axillary     SpO2: 100% 100% 98% 99%  Weight:      Height:        Body mass index is 15.48 kg/m.   Physical Exam   General:  Laying comfortably in bed; in no acute distress.  HENT: Normal oropharynx and mucosa. Normal external appearance of ears and nose.  Bump noted on her right forehead. Neck: Supple, no pain or tenderness  CV: No JVD. No peripheral edema.  Pulmonary: Symmetric Chest rise. Normal respiratory effort.  Abdomen: Soft to touch, non-tender.  Ext: No cyanosis, edema, or deformity  Skin: No rash. Normal palpation of skin.   Musculoskeletal: Normal digits and nails by inspection. No clubbing.   Neurologic Examination  Mental status/Cognition: Lethargic, oriented to self, did not answer any other orientation questions.  Follows commands intermittently with very poor attention as she keeps dozing off Speech/language: Limited speech output secondary to lethargy.hypophonic speech and slurred.  Comprehension intermittently intact to very simple commands.   Cranial nerves:   CN II Pupils equal and reactive to light, unable to assess for visual field deficit.   CN III,IV,VI Makes eye contact on left and right.  No gaze preference or deviation noted.   CN V Corneals intact bilaterally   CN VII Left facial droop.   CN VIII Makes eye contact to speech.   CN IX & X Seems to protecting her airway.   CN XI Unable to assess given the degree of somnolence.   CN XII midline tongue protrusion   Motor:  Muscle bulk: Poor, tone contracture noted in left upper extremity and some in left lower extremity. Unable to do detailed strength  testing secondary to somnolence. She is moving right upper extremity and bilateral lower extremities spontaneously.  Did not notice any antigravity movement in left upper extremity but definitely noted antigravity movement of right upper extremity and right lower extremity.  Sensation:  Light touch    Pin prick No response to proximal pinch in left upper extremity and left lower extremity.   Temperature    Vibration   Proprioception    Coordination/Complex Motor:  Unable to assess given the degree of somnolence. - Gait: Deferred for patient safety.  Labs/Imaging/Neurodiagnostic studies   CBC:  Recent Labs  Lab Jun 28, 2024 0513  WBC 11.7*  NEUTROABS 10.4*  HGB 15.8*  HCT 46.6*  MCV 84.7  PLT 171   Basic Metabolic Panel:  Lab Results  Component Value Date   NA 140 28-Jun-2024   K 4.1 Jun 28, 2024   CO2 21 (L) 06-28-24   GLUCOSE 83 06-28-2024   BUN 13 06/28/24   CREATININE 0.60 2024-06-28   CALCIUM 10.4 (H) 2024/06/28   GFRNONAA >60 06-28-24   GFRAA 119 08/18/2020   Lipid Panel:  Lab Results  Component Value Date   LDLCALC 64 02/06/2022   HgbA1c:  Lab Results  Component Value Date   HGBA1C 5.6 02/06/2022   Urine Drug Screen:     Component Value Date/Time   LABOPIA NONE DETECTED 04/14/2024 1540   COCAINSCRNUR NONE DETECTED 04/14/2024 1540   LABBENZ POSITIVE (A) 04/14/2024 1540   AMPHETMU NONE DETECTED 04/14/2024 1540   THCU NONE DETECTED 04/14/2024 1540   LABBARB NONE DETECTED 04/14/2024 1540    Alcohol Level     Component Value Date/Time   [facility] <15 06-28-2024 0527   INR  Lab Results  Component Value Date   INR 1.3 (H) 02/10/2024   APTT  Lab Results  Component Value Date   APTT 34 10/13/2023   AED levels: No results found for: PHENYTOIN, ZONISAMIDE, LAMOTRIGINE, LEVETIRACETA  CT Head without contrast(Personally reviewed): CTH was negative for a large hypodensity concerning for a large territory infarct or hyperdensity concerning for an  ICH. Right scalp/forehead swelling.  Neurodiagnostics cEEG:  Pending  ASSESSMENT   NAESHA BUCKALEW is a 53 y.o. 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  She was brought in the ED for further evaluation and workup.  She is postictal and somnolent.  She was loaded with Keppra  1000 mg IV once and also received Ativan  0.5 mg.  She is significantly lethargic but is able to open her eyes and follow one-step commands.  Her neuroexam is notable for known chronic left-sided weakness with contracture noted in left arm and left leg.  She has a notable left facial droop but she is moving her right arm and right leg spontaneously and antigravity.  She does have a history of subclinical seizures that were captured on continuous EEG in April of this year.  Given she is profoundly lethargic, I am going to go ahead and put her up on LTM EEG to see if she is having any subclinical seizures.  Most likely, I think she is postictal but also somnolent from the fact that she got 0.5 mg of IV Ativan .  RECOMMENDATIONS  -Increase Keppra  to 750 mg twice daily. -Continuous EEG for several hours to ensure she is not having any subclinical seizures.  She does have a known history of subclinical seizures captured on continuous EEG. -Neurology will continue to follow. -Workup for elevated troponin and management of rest of the comorbidities per primary team. ______________________________________________________________________    Signed, Odaly Peri, MD [facility]

## 2024-06-02 NOTE — ED Notes (Signed)
 Unable to get labs ?

## 2024-06-02 NOTE — Progress Notes (Signed)
 LTM EEG hooked up and running - no initial skin breakdown. Pt in ED not monitored. Family education on event button. CT leads used.

## 2024-06-02 NOTE — ED Notes (Signed)
 Unable to collect blood cultures from patient, attempted myself & [email or web address].

## 2024-06-02 NOTE — Progress Notes (Incomplete)
 Internal Medicine Teaching Service Attending Note Date: 06/02/2024  Patient name: Sarah Myers  Medical record number: [id number]  Date of birth: Jul 30, 1971   I have seen and evaluated Sarah Myers and discussed their care with the Residency Team.   53 yo F with hx of previous HTN giving rise to CVA (L PCA infarct) 2023. Previous PE on eliquis . 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 She stays at [facility].  Her last seizure was 1 month ago and was started on keppra  and depakote  at that time.  Today she was found to be seizing by her roommate. Generalized, lasting 2 minutes.  In ED she was loaded with ativan  and keppra , CT head showed no acute change.  She is afebrile and her WBC is 11.7.  Physical Exam: Blood pressure (!) 152/95, pulse 80, temperature 98.2 F (36.8 C), temperature source Axillary, resp. rate 19, height 5' 5 (1.651 m), weight 42.2 kg, SpO2 99%. General appearance: fatigued and no distress Eyes: PERR, opens eyes transiently.  Throat: dry Neck: no meningismus Resp: clear to auscultation bilaterally Cardio: regular rate and rhythm GI: normal findings: bowel sounds normal and soft, non-tender Extremities: contracted LUE. No LE edema.   Lab results: Results for orders placed or performed during the hospital encounter of 06/02/24 (from the past 24 hours)  CBC with Differential     Status: Abnormal   Collection Time: 06/02/24  5:13 AM  Result Value Ref Range   WBC 11.7 (H) 4.0 - 10.5 K/uL   RBC 5.50 (H) 3.87 - 5.11 MIL/uL   Hemoglobin 15.8 (H) 12.0 - 15.0 g/dL   HCT 32.9 (H) 51.8 - 84.1 %   MCV 84.7 80.0 - 100.0 fL   MCH 28.7 26.0 - 34.0 pg   MCHC 33.9 30.0 - 36.0 g/dL   RDW 66.0 63.0 - 16.0 %   Platelets 171 150 - 400 K/uL   nRBC 0.0 0.0 - 0.2 %   Neutrophils Relative % 89 %   Neutro Abs 10.4 (H) 1.7 - 7.7 K/uL   Lymphocytes Relative 9 %   Lymphs Abs 1.1 0.7 - 4.0 K/uL   Monocytes Relative 2 %   Monocytes Absolute 0.3 0.1 -  1.0 K/uL   Eosinophils Relative 0 %   Eosinophils Absolute 0.0 0.0 - 0.5 K/uL   Basophils Relative 0 %   Basophils Absolute 0.0 0.0 - 0.1 K/uL   Immature Granulocytes 0 %   Abs Immature Granulocytes 0.04 0.00 - 0.07 K/uL  Urinalysis, w/ Reflex to Culture (Infection Suspected) -Urine, Catheterized     Status: Abnormal   Collection Time: 06/02/24  5:13 AM  Result Value Ref Range   Specimen Source URINE, CATHETERIZED    Color, Urine STRAW (A) YELLOW   APPearance CLEAR CLEAR   Specific Gravity, Urine 1.010 1.005 - 1.030   pH 7.0 5.0 - 8.0   Glucose, UA NEGATIVE NEGATIVE mg/dL   Hgb urine dipstick NEGATIVE NEGATIVE   Bilirubin Urine NEGATIVE NEGATIVE   Ketones, ur 5 (A) NEGATIVE mg/dL   Protein, ur NEGATIVE NEGATIVE mg/dL   Nitrite NEGATIVE NEGATIVE   Leukocytes,Ua NEGATIVE NEGATIVE   RBC / HPF 0-5 0 - 5 RBC/hpf   WBC, UA 0-5 0 - 5 WBC/hpf   Bacteria, UA RARE (A) NONE SEEN   Squamous Epithelial / HPF 0-5 0 - 5 /HPF   Mucus PRESENT    Hyaline Casts, UA PRESENT   Ethanol     Status: None  Collection Time: 06/02/24  5:27 AM  Result Value Ref Range   Alcohol, Ethyl (B) <15 <15 mg/dL  Troponin I (High Sensitivity)     Status: Abnormal   Collection Time: 06/02/24  5:32 AM  Result Value Ref Range   Troponin I (High Sensitivity) 24 (H) <18 ng/L  Comprehensive metabolic panel with GFR     Status: Abnormal   Collection Time: 06/02/24  6:50 AM  Result Value Ref Range   Sodium 140 135 - 145 mmol/L   Potassium 4.1 3.5 - 5.1 mmol/L   Chloride 108 98 - 111 mmol/L   CO2 21 (L) 22 - 32 mmol/L   Glucose, Bld 83 70 - 99 mg/dL   BUN 13 6 - 20 mg/dL   Creatinine, Ser 0.86 0.44 - 1.00 mg/dL   Calcium 57.8 (H) 8.9 - 10.3 mg/dL   Total Protein 7.5 6.5 - 8.1 g/dL   Albumin  3.6 3.5 - 5.0 g/dL   AST 27 15 - 41 U/L   ALT 19 0 - 44 U/L   Alkaline Phosphatase 73 38 - 126 U/L   Total Bilirubin 0.9 0.0 - 1.2 mg/dL   GFR, Estimated >46 >96 mL/min   Anion gap 11 5 - 15  Lipase, blood     Status:  None   Collection Time: 06/02/24  6:50 AM  Result Value Ref Range   Lipase 31 11 - 51 U/L  Magnesium      Status: None   Collection Time: 06/02/24  6:50 AM  Result Value Ref Range   Magnesium  1.8 1.7 - 2.4 mg/dL  Troponin I (High Sensitivity)     Status: Abnormal   Collection Time: 06/02/24  8:05 AM  Result Value Ref Range   Troponin I (High Sensitivity) 50 (H) <18 ng/L  Resp panel by RT-PCR (RSV, Flu A&B, Covid) Anterior Nasal Swab     Status: None   Collection Time: 06/02/24 10:21 AM   Specimen: Anterior Nasal Swab  Result Value Ref Range   SARS Coronavirus 2 by RT PCR NEGATIVE NEGATIVE   Influenza A by PCR NEGATIVE NEGATIVE   Influenza B by PCR NEGATIVE NEGATIVE   Resp Syncytial Virus by PCR NEGATIVE NEGATIVE  I-Stat CG4 Lactic Acid     Status: None   Collection Time: 06/02/24 11:01 AM  Result Value Ref Range   Lactic Acid, Venous 1.0 0.5 - 1.9 mmol/L    Imaging results:  CT ABDOMEN PELVIS W CONTRAST Result Date: 06/02/2024 CLINICAL DATA:  Abdominal pain, acute, nonlocalized Recent seizure. EXAM: CT ABDOMEN AND PELVIS WITH CONTRAST TECHNIQUE: Multidetector CT imaging of the abdomen and pelvis was performed using the standard protocol following bolus administration of intravenous contrast. RADIATION DOSE REDUCTION: This exam was performed according to the departmental dose-optimization program which includes automated exposure control, adjustment of the mA and/or kV according to patient size and/or use of iterative reconstruction technique. CONTRAST:  75mL OMNIPAQUE  IOHEXOL  350 MG/ML SOLN COMPARISON:  Abdominopelvic CT 10/13/2023 and 02/05/2023. FINDINGS: Technical note: Examination was performed with the patient in the left lateral decubitus position. Lower chest: Patchy ground-glass opacities at both lung bases with probable linear atelectasis in the left lower lobe. No confluent airspace disease or significant pleural effusion. Hepatobiliary: The liver is normal in density without  suspicious focal abnormality. No evidence of gallstones, gallbladder wall thickening or biliary dilatation. Pancreas: Unremarkable. No pancreatic ductal dilatation or surrounding inflammatory changes. Spleen: Normal in size without focal abnormality. Adrenals/Urinary Tract: Both adrenal glands appear normal. No evidence of  urinary tract calculus, suspicious renal lesion or hydronephrosis. Stable small cyst in the upper pole of the left kidney for which no specific follow-up imaging is recommended. The bladder appears unremarkable for its degree of distention. Stomach/Bowel: No enteric contrast administered. The stomach appears unremarkable for its degree of distension. No evidence of bowel wall thickening, distention or surrounding inflammatory change. Interval improved colonic stool burden with less prominent stool in the rectum. Mild distal colonic diverticulosis. Vascular/Lymphatic: There are no enlarged abdominal or pelvic lymph nodes. Minimal iliac atherosclerosis. No acute vascular findings. Reproductive: The uterus and ovaries appear unremarkable. No adnexal mass. Other: No evidence of abdominal wall mass or hernia. No ascites or pneumoperitoneum. Musculoskeletal: No acute or significant osseous findings. IMPRESSION: 1. No acute findings or explanation for the patient's symptoms in the abdomen or pelvis. 2. Interval improved colonic stool burden. 3. Patchy ground-glass opacities at both lung bases, nonspecific but possibly infectious or inflammatory. Electronically Signed   By: Elmon Hagedorn M.D.   On: 06/02/2024 10:07   CT Head Wo Contrast Result Date: 06/02/2024 CLINICAL DATA:  53 year old female with witnessed seizure. Altered mental status. History of previous intracranial hemorrhage and infarct. EXAM: CT HEAD WITHOUT CONTRAST TECHNIQUE: Contiguous axial images were obtained from the base of the skull through the vertex without intravenous contrast. RADIATION DOSE REDUCTION: This exam was performed  according to the departmental dose-optimization program which includes automated exposure control, adjustment of the mA and/or kV according to patient size and/or use of iterative reconstruction technique. COMPARISON:  Brain MRI 04/15/2024, and earlier. FINDINGS: Brain: Chronic encephalomalacia in the left occipital lobe, dorsal right thalamus. Stable cerebral volume. No midline shift, ventriculomegaly, mass effect, evidence of mass lesion, intracranial hemorrhage or evidence of cortically based acute infarction. Vascular: No suspicious intracranial vascular hyperdensity. Mild basal ganglia vascular calcifications again noted. Skull: Stable.  No acute osseous abnormality identified. 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 Otherwise Visualized paranasal sinuses and mastoids are stable and well aerated. Other: Broad-based right forehead soft tissue swelling on series 3, image 40 is new. No scalp soft tissue gas. Visualized orbit soft tissues are within normal limits. IMPRESSION: 1. Right forehead scalp soft tissue swelling. No skull fracture identified. 2. No acute intracranial abnormality. Chronic encephalomalacia in the left occipital lobe, dorsal right thalamus. 3. Chronic calcified cribriform plate encephaloceles. Electronically Signed   By: Marlise Simpers M.D.   On: 06/02/2024 09:47   DG Chest Port 1 View Result Date: 06/02/2024 CLINICAL DATA:  53 year old female with altered mental status and seizure. EXAM: PORTABLE CHEST 1 VIEW COMPARISON:  Portable chest 04/14/2024 and earlier. FINDINGS: Portable AP supine view at 0604 hours. Lung volumes are stable, within normal limits. Normal cardiac size and mediastinal contours. Visualized tracheal air column is within normal limits. Allowing for portable technique the lungs are clear. No pneumothorax or pleural effusion. No acute osseous abnormality identified. Negative visible bowel gas. IMPRESSION:  Negative portable chest. Electronically Signed   By: Marlise Simpers M.D.   On: 06/02/2024 06:22    Assessment and Plan: I agree with the formulated Assessment and Plan with the following changes:  Seizure disorder HTN Previous CVA  Will watch on tele Follow up troponin.  Keppra , ativan   Appreciate neuro f/u Hold anbx for now, slight WBC, afebrile.  Sandie Cross, MD

## 2024-06-02 NOTE — ED Notes (Signed)
 Attempted to

## 2024-06-02 NOTE — ED Notes (Signed)
 Was only able to get 1 blue top culture bottle-5cc, called lab to let them know. Sent @2225 .

## 2024-06-03 DIAGNOSIS — Z79899 Other long term (current) drug therapy: Secondary | ICD-10-CM | POA: Diagnosis not present

## 2024-06-03 DIAGNOSIS — Z833 Family history of diabetes mellitus: Secondary | ICD-10-CM | POA: Diagnosis not present

## 2024-06-03 DIAGNOSIS — G9389 Other specified disorders of brain: Secondary | ICD-10-CM | POA: Diagnosis present

## 2024-06-03 DIAGNOSIS — I2489 Other forms of acute ischemic heart disease: Secondary | ICD-10-CM | POA: Diagnosis present

## 2024-06-03 DIAGNOSIS — J690 Pneumonitis due to inhalation of food and vomit: Secondary | ICD-10-CM | POA: Diagnosis present

## 2024-06-03 DIAGNOSIS — R300 Dysuria: Secondary | ICD-10-CM | POA: Diagnosis present

## 2024-06-03 DIAGNOSIS — F419 Anxiety disorder, unspecified: Secondary | ICD-10-CM | POA: Diagnosis present

## 2024-06-03 DIAGNOSIS — S0083XA Contusion of other part of head, initial encounter: Secondary | ICD-10-CM | POA: Diagnosis present

## 2024-06-03 DIAGNOSIS — Z7901 Long term (current) use of anticoagulants: Secondary | ICD-10-CM | POA: Diagnosis not present

## 2024-06-03 DIAGNOSIS — R569 Unspecified convulsions: Secondary | ICD-10-CM | POA: Diagnosis present

## 2024-06-03 DIAGNOSIS — Z8249 Family history of ischemic heart disease and other diseases of the circulatory system: Secondary | ICD-10-CM | POA: Diagnosis not present

## 2024-06-03 DIAGNOSIS — I10 Essential (primary) hypertension: Secondary | ICD-10-CM | POA: Diagnosis present

## 2024-06-03 DIAGNOSIS — X58XXXA Exposure to other specified factors, initial encounter: Secondary | ICD-10-CM | POA: Diagnosis present

## 2024-06-03 DIAGNOSIS — F32A Depression, unspecified: Secondary | ICD-10-CM | POA: Diagnosis present

## 2024-06-03 DIAGNOSIS — Z823 Family history of stroke: Secondary | ICD-10-CM | POA: Diagnosis not present

## 2024-06-03 DIAGNOSIS — R9431 Abnormal electrocardiogram [ECG] [EKG]: Secondary | ICD-10-CM | POA: Diagnosis present

## 2024-06-03 DIAGNOSIS — R29810 Facial weakness: Secondary | ICD-10-CM | POA: Diagnosis present

## 2024-06-03 DIAGNOSIS — G40409 Other generalized epilepsy and epileptic syndromes, not intractable, without status epilepticus: Secondary | ICD-10-CM | POA: Diagnosis present

## 2024-06-03 DIAGNOSIS — R7989 Other specified abnormal findings of blood chemistry: Secondary | ICD-10-CM | POA: Diagnosis present

## 2024-06-03 DIAGNOSIS — J9811 Atelectasis: Secondary | ICD-10-CM | POA: Diagnosis present

## 2024-06-03 DIAGNOSIS — R0902 Hypoxemia: Secondary | ICD-10-CM | POA: Diagnosis present

## 2024-06-03 DIAGNOSIS — R4189 Other symptoms and signs involving cognitive functions and awareness: Secondary | ICD-10-CM | POA: Diagnosis present

## 2024-06-03 DIAGNOSIS — Z86711 Personal history of pulmonary embolism: Secondary | ICD-10-CM | POA: Diagnosis not present

## 2024-06-03 DIAGNOSIS — Z1152 Encounter for screening for COVID-19: Secondary | ICD-10-CM | POA: Diagnosis not present

## 2024-06-03 DIAGNOSIS — I69298 Other sequelae of other nontraumatic intracranial hemorrhage: Secondary | ICD-10-CM | POA: Diagnosis not present

## 2024-06-03 DIAGNOSIS — Y92129 Unspecified place in nursing home as the place of occurrence of the external cause: Secondary | ICD-10-CM | POA: Diagnosis not present

## 2024-06-03 LAB — CBC
HCT: 46.7 % — ABNORMAL HIGH (ref 36.0–46.0)
Hemoglobin: 15.8 g/dL — ABNORMAL HIGH (ref 12.0–15.0)
MCH: 28.7 pg (ref 26.0–34.0)
MCHC: 33.8 g/dL (ref 30.0–36.0)
MCV: 84.8 fL (ref 80.0–100.0)
Platelets: 177 10*3/uL (ref 150–400)
RBC: 5.51 MIL/uL — ABNORMAL HIGH (ref 3.87–5.11)
RDW: 12 % (ref 11.5–15.5)
WBC: 6.4 10*3/uL (ref 4.0–10.5)
nRBC: 0 % (ref 0.0–0.2)

## 2024-06-03 LAB — BASIC METABOLIC PANEL WITH GFR
Anion gap: 9 (ref 5–15)
BUN: 12 mg/dL (ref 6–20)
CO2: 23 mmol/L (ref 22–32)
Calcium: 11.2 mg/dL — ABNORMAL HIGH (ref 8.9–10.3)
Chloride: 106 mmol/L (ref 98–111)
Creatinine, Ser: 0.7 mg/dL (ref 0.44–1.00)
GFR, Estimated: >60 mL/min (ref >60)
Glucose, Bld: 90 mg/dL (ref 70–99)
Potassium: 3.7 mmol/L (ref 3.5–5.1)
Sodium: 138 mmol/L (ref 135–145)

## 2024-06-03 LAB — URINALYSIS, ROUTINE W REFLEX MICROSCOPIC
Bacteria, UA: NONE SEEN
Bilirubin Urine: NEGATIVE
Glucose, UA: NEGATIVE mg/dL
Hgb urine dipstick: NEGATIVE
Ketones, ur: 80 mg/dL — AB
Leukocytes,Ua: NEGATIVE
Nitrite: NEGATIVE
Protein, ur: 30 mg/dL — AB
Specific Gravity, Urine: >1.046 — ABNORMAL HIGH (ref 1.005–1.030)
pH: 5 (ref 5.0–8.0)

## 2024-06-03 LAB — GLUCOSE, CAPILLARY: Glucose-Capillary: 111 mg/dL — ABNORMAL HIGH (ref 70–99)

## 2024-06-03 LAB — MAGNESIUM: Magnesium: 2.3 mg/dL (ref 1.7–2.4)

## 2024-06-03 LAB — TROPONIN I (HIGH SENSITIVITY): Troponin I (High Sensitivity): 26 ng/L — ABNORMAL HIGH (ref <18)

## 2024-06-03 MED ORDER — BUSPIRONE HCL 15 MG PO TABS: 7.5000 mg | ORAL_TABLET | Freq: Two times a day (BID) | ORAL | Status: DC | PRN

## 2024-06-03 MED ORDER — POLYETHYLENE GLYCOL 3350 17 G PO PACK
17.0000 g | PACK | Freq: Every day | ORAL | Status: DC
  Administered 2024-06-03: 17 g via ORAL
  Filled 2024-06-03 (×2): qty 1

## 2024-06-03 MED ORDER — GABAPENTIN 100 MG PO CAPS
100.0000 mg | ORAL_CAPSULE | Freq: Two times a day (BID) | ORAL | Status: DC
  Administered 2024-06-03 – 2024-06-04 (×3): 100 mg via ORAL
  Filled 2024-06-03 (×3): qty 1

## 2024-06-03 MED ORDER — LEVETIRACETAM (KEPPRA) 500 MG/5 ML ADULT IV PUSH
1000.0000 mg | Freq: Two times a day (BID) | INTRAVENOUS | Status: DC
  Administered 2024-06-03 – 2024-06-04 (×2): 1000 mg via INTRAVENOUS
  Filled 2024-06-03 (×2): qty 10

## 2024-06-03 MED ORDER — AMLODIPINE BESYLATE 5 MG PO TABS
10.0000 mg | ORAL_TABLET | Freq: Every day | ORAL | Status: DC
  Administered 2024-06-03 – 2024-06-04 (×2): 10 mg via ORAL
  Filled 2024-06-03 (×2): qty 2

## 2024-06-03 MED ORDER — SENNOSIDES-DOCUSATE SODIUM 8.6-50 MG PO TABS
1.0000 | ORAL_TABLET | Freq: Two times a day (BID) | ORAL | Status: DC
  Administered 2024-06-03 – 2024-06-04 (×3): 1 via ORAL
  Filled 2024-06-03 (×3): qty 1

## 2024-06-03 MED ORDER — LOSARTAN POTASSIUM 25 MG PO TABS
25.0000 mg | ORAL_TABLET | Freq: Every day | ORAL | Status: DC
  Administered 2024-06-03 – 2024-06-04 (×2): 25 mg via ORAL
  Filled 2024-06-03 (×2): qty 1

## 2024-06-03 MED ORDER — BACLOFEN 10 MG PO TABS: 15.0000 mg | ORAL_TABLET | Freq: Three times a day (TID) | ORAL | Status: DC | PRN

## 2024-06-03 MED ORDER — AMANTADINE HCL 100 MG PO CAPS
100.0000 mg | ORAL_CAPSULE | Freq: Two times a day (BID) | ORAL | Status: DC
  Administered 2024-06-03 – 2024-06-04 (×3): 100 mg via ORAL
  Filled 2024-06-03 (×4): qty 1

## 2024-06-03 MED ORDER — MIRTAZAPINE 15 MG PO TABS
7.5000 mg | ORAL_TABLET | Freq: Every day | ORAL | Status: DC
  Administered 2024-06-03: 7.5 mg via ORAL
  Filled 2024-06-03: qty 1

## 2024-06-03 MED ORDER — TIZANIDINE HCL 4 MG PO TABS
2.0000 mg | ORAL_TABLET | Freq: Every day | ORAL | Status: DC
  Administered 2024-06-03: 2 mg via ORAL
  Filled 2024-06-03: qty 1

## 2024-06-03 MED ORDER — BUSPIRONE HCL 15 MG PO TABS: 7.5000 mg | ORAL_TABLET | Freq: Two times a day (BID) | ORAL | Status: DC

## 2024-06-03 MED ORDER — LEVETIRACETAM (KEPPRA) 500 MG/5 ML ADULT IV PUSH
250.0000 mg | Freq: Once | INTRAVENOUS | Status: AC
  Administered 2024-06-03: 250 mg via INTRAVENOUS
  Filled 2024-06-03: qty 5

## 2024-06-03 NOTE — Plan of Care (Signed)
  Problem: Clinical Measurements: Goal: Respiratory complications will improve Outcome: Progressing Goal: Cardiovascular complication will be avoided Outcome: Progressing   Problem: Pain Managment: Goal: General experience of comfort will improve and/or be controlled Outcome: Progressing   Problem: Skin Integrity: Goal: Risk for impaired skin integrity will decrease Outcome: Progressing   Problem: Education: Goal: Knowledge of General Education information will improve Description: Including pain rating scale, medication(s)/side effects and non-pharmacologic comfort measures Outcome: Not Progressing   Problem: Activity: Goal: Risk for activity intolerance will decrease Outcome: Not Progressing

## 2024-06-03 NOTE — Progress Notes (Signed)
 The patient is admitted to [address] With the diagnosis of seizure. A & O x 2. She's very agitated and hitting staff during care. Admission profile not completed due to confusion. Will  be done when the family gets here on day shift.

## 2024-06-03 NOTE — Progress Notes (Addendum)
 LTM maint complete - no skin breakdown seen . Pt had been moved to 3w.  All leads attached, cart and head box present and connected. Study placed online. Serviced A1 and F7 [facility] monitored, Event button test confirmed by [facility].

## 2024-06-03 NOTE — Procedures (Addendum)
 Patient Name: Sarah Myers  MRN: [id number]  Epilepsy Attending: Arleene Lack  Referring Physician/Provider: Khaliqdina, Salman, MD  Duration: 06/02/2024 1744 to 06/03/2024 1744  Patient history:  53 y.o. 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 EEG to evaluate for seizure.  Level of alertness: Awake, asleep  AEDs during EEG study: LEV  Technical aspects: This EEG study was done with scalp electrodes positioned according to the 10-20 International system of electrode placement. Electrical activity was reviewed with band pass filter of 1-70Hz , sensitivity of 7 uV/mm, display speed of 91mm/sec with a 60Hz  notched filter applied as appropriate. EEG data were recorded continuously and digitally stored.  Video monitoring was available and reviewed as appropriate.  Description:  The posterior dominant rhythm consists of 8-9 Hz activity of moderate voltage (25-35 uV) seen predominantly in posterior head regions, symmetric and reactive to eye opening and eye closing. Sleep was characterized by vertex waves, sleep spindles (12 to 14 Hz), maximal frontocentral region. Intermittent 3-5hz  theta-delta slowing was noted in left temporal region. Hyperventilation and photic stimulation were not performed.      Two seizures without clinical signs were noted on 06/03/2024 at 0402 and 1111. During seizure EEG showed sharp waves in left temporal region followed by rhythmic 6-7hz  high amplitude sharply contoured theta slowing which then involve all of left frontotemporal region. EEG then evolved into 2 to 3 Hz high amplitude sharply contoured delta slowing admixed with sharp waves. Around this time patient woke up but no clinical signs were noted. Duration of seizure was about 1 minute 10 seconds  and 55 seconds respectively.  ABNORMALITY - Seizure without clinical signs, left temporal region - Intermittent slow, left temporal region   IMPRESSION: This study showed two seizures without clinical signs arising from left temporal region on 06/03/2024 at 0402 and 1111 lasting for about 1 minute 10 seconds and 55 seconds respectively. Additionally there was cortical dysfunction in left temporal region likely secondary to underlying structural abnormality.    Marleta Lapierre O Terrion Gencarelli

## 2024-06-03 NOTE — Evaluation (Signed)
 Clinical/Bedside Swallow Evaluation Patient Details  Name: Sarah Myers MRN: [id number] Date of Birth: Aug 27, 1971  Today's Date: 06/03/2024 Time: SLP Start Time (ACUTE ONLY): 0935 SLP Stop Time (ACUTE ONLY): 0945 SLP Time Calculation (min) (ACUTE ONLY): 10 min  Past Medical History:  Past Medical History:  Diagnosis Date   Hypertension    Stroke [facility])    Past Surgical History:  Past Surgical History:  Procedure Laterality Date   BREAST LUMPECTOMY  1992   CESAREAN SECTION     IR GASTROSTOMY TUBE MOD SED  03/06/2022   ORIF RADIAL FRACTURE  02/10/2012   Procedure: OPEN REDUCTION INTERNAL FIXATION (ORIF) RADIAL FRACTURE;  Surgeon: Jasmine Mesi, MD;  Location: [facility] OR;  Service: Orthopedics;  Laterality: Right;   ORIF ULNAR FRACTURE  02/10/2012   Procedure: OPEN REDUCTION INTERNAL FIXATION (ORIF) ULNAR FRACTURE;  Surgeon: Jasmine Mesi, MD;  Location: [facility] OR;  Service: Orthopedics;  Laterality: Right;   TUBAL LIGATION     HPI:  Sarah Myers is a 53 year old femalewho presented to the emergency department for a seizure.   Per her SNF facility, she seizure like activity at some point overnight/early morning, admitted 6/16. They deny missed Keppra  doses and denies drug or alcohol use. At baseline she is oriented to self and place with +/- hallucinations. CT imaging showed patchy ground-glass opacities at both lung bases. Patient is saturating well on room air, afebrile, and does not have additional signs of pneumonia.  with a past medical history of Nontraumatic intracranial hemorrhage with seizure disorder, spastic hemiplegia, and cognitive deficits, prior history of trach and PEG. History of PE on Eliquis , Hypertension, Anxiety and mood disorder. Pt has been seen by SLP for seizure in the past. Advanced from puree/thin to mech soft.    Assessment / Plan / Recommendation  Clinical Impression  Pt seen sitting edge of bed during PT/OT assessment; fully alert and folowing  commands well. Pt reports hunger. No signs of aspiration with small sips of thin liquids. Able to self feed puree and graham cracker without difficulty observed. Will resume baseline diet given adequate arousal and attention and prior tolerance of mech soft/thin diet in previous admissions. Pt will need set up assistance with meals, but may be able to feed herself when upright and container opened. No SLP f/u needed. Will sign off. SLP Visit Diagnosis: Dysphagia, unspecified (R13.10)    Aspiration Risk  Mild aspiration risk    Diet Recommendation Dysphagia 3 (Mech soft);Thin liquid    Liquid Administration via: Cup;Straw Medication Administration: Other (Comment) (per pt preference) Supervision: Staff to assist with self feeding Compensations: Slow rate;Small sips/bites Postural Changes: Seated upright at 90 degrees    Other  Recommendations Oral Care Recommendations: Oral care BID     Assistance Recommended at Discharge    Functional Status Assessment Patient has had a recent decline in their functional status and demonstrates the ability to make significant improvements in function in a reasonable and predictable amount of time.  Frequency and Duration            Prognosis        Swallow Study   General HPI: Sarah Myers is a 53 year old femalewho presented to the emergency department for a seizure.   Per her SNF facility, she seizure like activity at some point overnight/early morning, admitted 6/16. They deny missed Keppra  doses and denies drug or alcohol use. At baseline she is oriented to self and place with +/- hallucinations.  CT imaging showed patchy ground-glass opacities at both lung bases. Patient is saturating well on room air, afebrile, and does not have additional signs of pneumonia.  with a past medical history of Nontraumatic intracranial hemorrhage with seizure disorder, spastic hemiplegia, and cognitive deficits, prior history of trach and PEG. History of PE on  Eliquis , Hypertension, Anxiety and mood disorder. Pt has been seen by SLP for seizure in the past. Advanced from puree/thin to mech soft. Type of Study: Bedside Swallow Evaluation Previous Swallow Assessment: 04/15/24 Dys 3/thin Diet Prior to this Study: NPO Temperature Spikes Noted: Yes Respiratory Status: Room air History of Recent Intubation: No Behavior/Cognition: Alert;Cooperative;Pleasant mood Oral Cavity Assessment: Within Functional Limits Oral Care Completed by SLP: No Oral Cavity - Dentition: Adequate natural dentition Vision: Functional for self-feeding Self-Feeding Abilities: Needs assist Patient Positioning: Other (comment) (edge of bed) Baseline Vocal Quality: Low vocal intensity Volitional Cough: Weak    Oral/Motor/Sensory Function Facial ROM: Reduced left;Suspected CN VII (facial) dysfunction   Ice Chips Ice chips: Not tested   Thin Liquid Thin Liquid: Within functional limits Presentation: Straw;Cup Oral Phase Impairments: Reduced labial seal (anterior spillage on the left)    Nectar Thick Nectar Thick Liquid: Not tested   Honey Thick Honey Thick Liquid: Not tested   Puree Puree: Within functional limits Presentation: Self Fed   Solid     Solid: Within functional limits Presentation: Self Fed      Coltan Spinello, Hardin Leys 06/03/2024,10:14 AM

## 2024-06-03 NOTE — Evaluation (Signed)
 Physical Therapy Evaluation Patient Details Name: Sarah Myers MRN: [id number] DOB: 01-17-1971 Today's Date: 06/03/2024  History of Present Illness  53 y.o. female presents on 6/16 after seizure. Admitted for post-ictal state. CT with aspiration PNA. PMH includes ICH with spastic L hemiplegia, PE, HTN, anxiety, mood disorder.  Clinical Impression  Pt presents with admitting diagnosis above. Co-treat with OT. Pt today was able to sit EOB with +2 Min/Mod A. CGA sitting balance. PTA pt is from SNF and mostly WC bound however reports that she can usually transfer herself. Patient will benefit from continued inpatient follow up therapy, <3 hours/day. PT will continue to follow.         If plan is discharge home, recommend the following: A lot of help with walking and/or transfers;A lot of help with bathing/dressing/bathroom;Assistance with cooking/housework;Direct supervision/assist for medications management;Assist for transportation;Direct supervision/assist for financial management;Help with stairs or ramp for entrance;Supervision due to cognitive status   Can travel by private vehicle   No    Equipment Recommendations None recommended by PT  Recommendations for Other Services       Functional Status Assessment Patient has had a recent decline in their functional status and demonstrates the ability to make significant improvements in function in a reasonable and predictable amount of time.     Precautions / Restrictions Precautions Precautions: Fall Recall of Precautions/Restrictions: Impaired Precaution/Restrictions Comments: L spastic hemiplegia, EEG Restrictions Weight Bearing Restrictions Per Provider Order: No      Mobility  Bed Mobility Overal bed mobility: Needs Assistance Bed Mobility: Supine to Sit, Sit to Supine     Supine to sit: Mod assist, +2 for physical assistance Sit to supine: Min assist, +2 for physical assistance   General bed mobility comments: pt  with cueing for technique, assist for L side, scooting hips fully and trunk with slow initation and poor sequencing    Transfers                   General transfer comment: deferred d/t fatigue    Ambulation/Gait                  Stairs            Wheelchair Mobility     Tilt Bed    Modified Rankin (Stroke Patients Only)       Balance Overall balance assessment: Needs assistance Sitting-balance support: No upper extremity supported, Feet supported Sitting balance-Leahy Scale: Fair Sitting balance - Comments: min assist dynamically, contact guard statically                                     Pertinent Vitals/Pain Pain Assessment Pain Assessment: Faces Faces Pain Scale: Hurts a little bit Pain Location: movement of L UE to don gown Pain Descriptors / Indicators: Grimacing Pain Intervention(s): Limited activity within patient's tolerance, Monitored during session    Home Living Family/patient expects to be discharged to:: Skilled nursing facility                        Prior Function Prior Level of Function : Needs assist             Mobility Comments: pt with difficulty reporting PLOF, but does reports using WC since L sided hemiparesis; per last admission--reports performing squat pivot transfers to a manual wheelchair, states she is able to mobilize in the wheelchair  around the facility by utilizing her R side to propel and steer ADLs Comments: assistance for bathing, dressing, IADLs; pt reports ability to feed and groom using R UE     Extremity/Trunk Assessment   Upper Extremity Assessment Upper Extremity Assessment: LUE deficits/detail LUE Deficits / Details: L UE held in flexion contracture positioning.  Limited assessment due to pain with donning gown.  Baseline spastic hemiparesis LUE Coordination: decreased fine motor;decreased gross motor    Lower Extremity Assessment Lower Extremity Assessment: LLE  deficits/detail LLE Deficits / Details: LLE much weaker than RLE however able to move slightly against gravity.    Cervical / Trunk Assessment Cervical / Trunk Assessment: Kyphotic  Communication   Communication Communication: Other (comment);Impaired (soft spoken) Factors Affecting Communication: Difficulty expressing self    Cognition Arousal: Lethargic, Alert Behavior During Therapy: Flat affect                             Following commands: Impaired Following commands impaired: Only follows one step commands consistently     Cueing Cueing Techniques: Verbal cues, Visual cues     General Comments General comments (skin integrity, edema, etc.): VSS    Exercises     Assessment/Plan    PT Assessment Patient needs continued PT services  PT Problem List Decreased strength;Decreased activity tolerance;Decreased balance;Decreased mobility;Decreased cognition;Decreased knowledge of use of DME;Decreased safety awareness;Decreased knowledge of precautions;Impaired tone       PT Treatment Interventions DME instruction;Functional mobility training;Therapeutic exercise;Therapeutic activities;Balance training;Neuromuscular re-education;Cognitive remediation;Patient/family education;Wheelchair mobility training    PT Goals (Current goals can be found in the Care Plan section)  Acute Rehab PT Goals Patient Stated Goal: to improve mobility quality and strength PT Goal Formulation: With patient Time For Goal Achievement: 06/17/24 Potential to Achieve Goals: Fair    Frequency Min 2X/week     Co-evaluation PT/OT/SLP Co-Evaluation/Treatment: Yes Reason for Co-Treatment: Necessary to address cognition/behavior during functional activity;For patient/therapist safety;To address functional/ADL transfers PT goals addressed during session: Mobility/safety with mobility;Balance;Proper use of DME OT goals addressed during session: ADL's and self-care       AM-PAC PT 6  Clicks Mobility  Outcome Measure Help needed turning from your back to your side while in a flat bed without using bedrails?: A Little Help needed moving from lying on your back to sitting on the side of a flat bed without using bedrails?: A Lot Help needed moving to and from a bed to a chair (including a wheelchair)?: A Lot Help needed standing up from a chair using your arms (e.g., wheelchair or bedside chair)?: A Lot Help needed to walk in hospital room?: Total Help needed climbing 3-5 steps with a railing? : Total 6 Click Score: 11    End of Session   Activity Tolerance: Patient tolerated treatment well Patient left: in bed;with call bell/phone within reach;with bed alarm set Nurse Communication: Mobility status PT Visit Diagnosis: Other abnormalities of gait and mobility (R26.89);Muscle weakness (generalized) (M62.81);Hemiplegia and hemiparesis Hemiplegia - Right/Left: Left Hemiplegia - dominant/non-dominant: Non-dominant Hemiplegia - caused by: Other Nontraumatic intracranial hemorrhage    Time: 0917-0943 PT Time Calculation (min) (ACUTE ONLY): 26 min   Charges:   PT Evaluation $PT Eval Moderate Complexity: 1 Mod   PT General Charges $$ ACUTE PT VISIT: 1 Visit         Rodgers Clack, PT, DPT Acute Rehab Services [phone]   Shery Wauneka 06/03/2024, 1:49 PM

## 2024-06-03 NOTE — Progress Notes (Signed)
 Subjective:  Interval events: Patient was agitated and hitting staff overnight. She also passed bedside swallow evaluation.  Patient is more alert and interactive this morning. She was able to answer most questions and seems more at baseline per information from SNF. She denies any pain. She has no questions or concerns.  Objective:  Vital signs in last 24 hours: Vitals:   06/03/24 0418 06/03/24 0722 06/03/24 0725 06/03/24 1102  BP: (!) 149/95 (!) 160/102 (!) 142/100 123/89  Pulse: 92 93 86 75  Resp: 16 18  18   Temp: 99 F (37.2 C) 100.1 F (37.8 C)  98.5 F (36.9 C)  TempSrc: Oral Oral  Oral  SpO2: 98% 100% 100% 99%  Weight:      Height:       Intake/Output Summary (Last 24 hours) at 06/03/2024 1219 Last data filed at 06/02/2024 1936 Gross per 24 hour  Intake 47.12 ml  Output --  Net 47.12 ml   General: Chronically ill-appearing female sitting upright in bed watching TV, in NAD HEENT: EEG leads on scalp. Improving edema/contusion on right forehead. Conjunctivae clear. No nasal congestion or rhinorrhea. MMM. Cardiovascular: Normal rate with regular rhythm. No murmurs, rubs, or gallops.  Pulmonary: Lungs CTAB. No wheezes, rales, or rhonchi. Normal respiratory effort on room air. Abdominal: Normal bowel sounds.  Musculoskeletal: Left arm and leg spasticity/contractures.   Neurological: Alert and oriented to person and place. Stated the year as 2017. Skin: Warm and dry.    Assessment/Plan:  Principal Problem:   Post-ictal state (HCC)  Sarah Myers is a 53 year-old female with PMH of nontraumatic intracranial hemorrhage with seizure disorder, spastic hemiplegia, and cognitive deficits, history of PE on Eliquis , hypertension, anxiety, and mood disorder who presented with seizures and admitted on 06/02/2024 for post-ictal state.   #Post-Ictal State  #Seizure Disorder 2/2 Nontraumatic intracranial hemorrhage Patient is no longer post-ictal when seen this morning. She is  at her baseline of alert and oriented x2. EEG showed one sub-clinical seizure arising from left temporal region on 06/03/2024 at 0402 lasting for about 1 minute 10 seconds. Presentation most likely breakthrough seizures. Given additional seizure on EEG, neurology increased Keppra  to 1000 mg BID with plans to monitor EEG overnight. - Neurology consulted; appreciate recs - Keppra  1,000 mg BID   - LTM EEG  - Ativan  0.5 mg Q4 PRN for abortive therapy  - Seizure precautions  - PT/OT consulted   #Aspiration Pneumonitis  CT imaging obtained in ED showed patchy ground-glass opacities at both lung bases. Suspect opacities are secondary to atelectasis vs aspiration pneumonitis. Patient remains afebrile with normal saturations on room air. Will hold off on antibiotics at this time. - Incentive spirometry  - CBC tomorrow morning    #Elevated Troponin, resolved Trended troponin until flat (24->50->26). Suspect troponin elevated due to demand ischemia/hypoxia from seizure.   #Prolonged QTc, resolved Initial EKG in the ED with QTc 630 ms. Repeat EKG with QTc 446 ms.   #Dysuria Patient reported dysuria in the ED. UA with rare bacteria but no leukocytes or nitrites. Will not start antibiotics at this time.   #History of PE on eliquis   Patient diagnosed with bilateral pulmonary emboli without right heart strain after CVA in 02/2022. Will continue home Eliquis  5 mg BID at home while hospitalized. - Home Eliquis  5 mg BID   #Anxiety, Depression, Mood Disorder  Psychiatric medications were initially held since patient was NPO until passing bedside speech evaluation. Will restart home medications as below. - amantadine   100 mg BID  - baclofen  15 mg TID PRN - buspirone  7.5 mg BID PRN - gabapentin  100 mg BID - mirtazapine  7.5 mg nightly - tizanidine  2 mg nightly  #Hypertension Antihypertensive medications were initially held since patient was NPO until passing bedside speech evaluation. Blood pressures  elevated with SBPs 142-170. Will restart home losartan  and amlodipine  now. Will hold home hydralazine  and carvedilol  with plans to restart if she remains hypertensive. - Home losartan  25 mg daily - Home amlodipine  10 mg daily  - Hold home Coreg  6.25 mg BID - Hold home hydralazine  25 mg QID   LOS: 0 days   Elmira Haddock, Medical Student 06/03/2024, 12:19 PM

## 2024-06-03 NOTE — Evaluation (Signed)
 Occupational Therapy Evaluation Patient Details Name: Sarah Myers MRN: [id number] DOB: 09-14-1971 Today's Date: 06/03/2024   History of Present Illness   53 y.o. female presents on 6/16 after seizure. Admitted for post-ictal state. CT with aspiration PNA. PMH includes ICH with spastic L hemiplegia, PE, HTN, anxiety, mood disorder.     Clinical Impressions Pt from SNF, admitted for above and limited by problem list below.  She reports using wheelchair since ICH (L weakness) and needing assist for ADLs, but able to self feed and complete grooming.  She has difficulty expressing how she transfers into wheelchair, but per chart review of last admission was needing some assist for squat pivot. Patient lethargic upon entry, but improved alertness when engaged and upright at EOB. Required mod  to min assist +2 for bed mobility, min assist to contact guard for sitting balance at EOB; setup to total assist for ADLs.  Pt with slow processing and expressive difficulties at times, poor initiation and sequencing. Voices several times she would like to eat, and needed reminders that SLP ordered her breakfast.  Based on performance today, believe patient will best benefit from continued OT services acutely and after dc at inpatient setting with <3hrs/day to optimize independence, safety with ADLs and mobility.      If plan is discharge home, recommend the following:   A lot of help with bathing/dressing/bathroom;A lot of help with walking and/or transfers;Direct supervision/assist for medications management;Direct supervision/assist for financial management;Assist for transportation;Help with stairs or ramp for entrance;Supervision due to cognitive status     Functional Status Assessment   Patient has had a recent decline in their functional status and demonstrates the ability to make significant improvements in function in a reasonable and predictable amount of time.     Equipment  Recommendations   None recommended by OT     Recommendations for Other Services         Precautions/Restrictions   Precautions Precautions: Fall Recall of Precautions/Restrictions: Impaired Precaution/Restrictions Comments: L spastic hemiplegia, EEG Restrictions Weight Bearing Restrictions Per Provider Order: No     Mobility Bed Mobility Overal bed mobility: Needs Assistance Bed Mobility: Supine to Sit, Sit to Supine     Supine to sit: Mod assist, +2 for physical assistance Sit to supine: Min assist, +2 for physical assistance   General bed mobility comments: pt with cueing for technique, assist for L side, scooting hips fully and trunk with slow initation and poor sequencing    Transfers                   General transfer comment: deferred d/t fatigue      Balance Overall balance assessment: Needs assistance Sitting-balance support: No upper extremity supported, Feet supported Sitting balance-Leahy Scale: Fair Sitting balance - Comments: min assist dynamically, contact guard statically                                   ADL either performed or assessed with clinical judgement   ADL Overall ADL's : Needs assistance/impaired Eating/Feeding: Sitting;Minimal assistance Eating/Feeding Details (indicate cue type and reason): sitting EOB with SLP present to assess feeding Grooming: Set up;Wash/dry face;Sitting           Upper Body Dressing : Maximal assistance;Bed level Upper Body Dressing Details (indicate cue type and reason): donning gown Lower Body Dressing: +2 for physical assistance;Total assistance     Toilet Transfer Details (indicate cue  type and reason): deferred         Functional mobility during ADLs: Moderate assistance;Minimal assistance;+2 for physical assistance General ADL Comments: limited to EOB     Vision Patient Visual Report: No change from baseline Vision Assessment?: Vision impaired- to be further tested  in functional context Additional Comments: pt able to scan L and R, with head turns when cued.  Demonstrates visual deficits, but difficult to assess due to cognition.  Further assessment required.     Perception         Praxis         Pertinent Vitals/Pain Pain Assessment Pain Assessment: Faces Faces Pain Scale: Hurts a little bit Pain Location: movement of L UE to don gown Pain Descriptors / Indicators: Grimacing Pain Intervention(s): Limited activity within patient's tolerance, Monitored during session, Repositioned     Extremity/Trunk Assessment Upper Extremity Assessment Upper Extremity Assessment: LUE deficits/detail LUE Deficits / Details: L UE held in flexion contracture positioning.  Limited assessment due to pain with donning gown.  Baseline spastic hemiparesis LUE Coordination: decreased fine motor;decreased gross motor   Lower Extremity Assessment Lower Extremity Assessment: Defer to PT evaluation   Cervical / Trunk Assessment Cervical / Trunk Assessment: Kyphotic   Communication Communication Communication: Other (comment);Impaired (soft spoken) Factors Affecting Communication: Difficulty expressing self   Cognition Arousal: Lethargic, Alert Behavior During Therapy: Flat affect Cognition: Cognition impaired, Difficult to assess Difficult to assess due to: Impaired communication Orientation impairments: Place, Time, Situation Awareness: Intellectual awareness impaired, Online awareness impaired Memory impairment (select all impairments): Short-term memory, Working memory, Non-declarative long-term memory, Geneticist, molecular long-term memory Attention impairment (select first level of impairment): Focused attention Executive functioning impairment (select all impairments): Initiation, Organization, Sequencing, Reasoning, Problem solving OT - Cognition Comments: pt lethargic upon entry but improved during session, she is oriented to self only.  Able to follow simple  commands with increased time.  Focused on wanting to eat during session.                 Following commands: Impaired Following commands impaired: Only follows one step commands consistently     Cueing  General Comments   Cueing Techniques: Verbal cues;Visual cues  VSS, EEG hooked up; SLP assessing swallowing sitting EOB   Exercises     Shoulder Instructions      Home Living Family/patient expects to be discharged to:: Skilled nursing facility                                        Prior Functioning/Environment Prior Level of Function : Needs assist             Mobility Comments: pt with difficulty reporting PLOF, but does reports using WC since L sided hemiparesis; per last admission--reports performing squat pivot transfers to a manual wheelchair, states she is able to mobilize in the wheelchair around the facility by utilizing her R side to propel and steer ADLs Comments: assistance for bathing, dressing, IADLs; pt reports ability to feed and groom using R UE    OT Problem List: Impaired balance (sitting and/or standing);Decreased activity tolerance;Decreased strength;Decreased range of motion;Impaired vision/perception;Decreased coordination;Decreased cognition;Decreased safety awareness;Impaired tone;Impaired UE functional use   OT Treatment/Interventions: Therapeutic exercise;Self-care/ADL training;Energy conservation;DME and/or AE instruction;Manual therapy;Patient/family education;Balance training;Cognitive remediation/compensation;Visual/perceptual remediation/compensation;Therapeutic activities;Neuromuscular education      OT Goals(Current goals can be found in the care plan section)   Acute Rehab  OT Goals Patient Stated Goal: get better OT Goal Formulation: With patient Time For Goal Achievement: 06/17/24 Potential to Achieve Goals: Fair   OT Frequency:  Min 2X/week    Co-evaluation PT/OT/SLP Co-Evaluation/Treatment: Yes Reason  for Co-Treatment: Necessary to address cognition/behavior during functional activity;For patient/therapist safety;To address functional/ADL transfers   OT goals addressed during session: ADL's and self-care      AM-PAC OT 6 Clicks Daily Activity     Outcome Measure Help from another person eating meals?: A Little Help from another person taking care of personal grooming?: A Little Help from another person toileting, which includes using toliet, bedpan, or urinal?: A Lot Help from another person bathing (including washing, rinsing, drying)?: A Lot Help from another person to put on and taking off regular upper body clothing?: A Lot Help from another person to put on and taking off regular lower body clothing?: Total 6 Click Score: 13   End of Session Nurse Communication: Mobility status  Activity Tolerance: Patient tolerated treatment well Patient left: in bed;with call bell/phone within reach;with bed alarm set (bed in chair position)  OT Visit Diagnosis: Other abnormalities of gait and mobility (R26.89);Other symptoms and signs involving cognitive function;Muscle weakness (generalized) (M62.81);Unsteadiness on feet (R26.81)                Time: 1610-9604 OT Time Calculation (min): 27 min Charges:  OT General Charges $OT Visit: 1 Visit OT Evaluation $OT Eval Moderate Complexity: 1 Mod  Bary Boss, OT Acute Rehabilitation Services Office [phone] Secure Chat Preferred    Fredrich Jefferson 06/03/2024, 11:26 AM

## 2024-06-03 NOTE — Plan of Care (Signed)
  Problem: Clinical Measurements: Goal: Will remain free from infection Outcome: Progressing Goal: Respiratory complications will improve Outcome: Progressing   Problem: Activity: Goal: Risk for activity intolerance will decrease Outcome: Progressing   Problem: Nutrition: Goal: Adequate nutrition will be maintained Outcome: Progressing   Problem: Elimination: Goal: Will not experience complications related to bowel motility Outcome: Progressing Goal: Will not experience complications related to urinary retention Outcome: Progressing   Problem: Pain Managment: Goal: General experience of comfort will improve and/or be controlled Outcome: Progressing   Problem: Safety: Goal: Ability to remain free from injury will improve Outcome: Progressing   Problem: Skin Integrity: Goal: Risk for impaired skin integrity will decrease Outcome: Progressing   Problem: Education: Goal: Knowledge of General Education information will improve Description: Including pain rating scale, medication(s)/side effects and non-pharmacologic comfort measures Outcome: Not Progressing   Problem: Health Behavior/Discharge Planning: Goal: Ability to manage health-related needs will improve Outcome: Not Progressing   Problem: Clinical Measurements: Goal: Ability to maintain clinical measurements within normal limits will improve Outcome: Not Progressing

## 2024-06-03 NOTE — Progress Notes (Signed)
 NEUROLOGY CONSULT FOLLOW UP NOTE   Date of service: June 03, 2024 Patient Name: Sarah Myers MRN:  [id number] DOB:  May 29, 1971  Interval Hx/subjective  Patient has remained hemodynamically stable overnight with Tmax of 100.2.  Mental status is improved with the patient responding to her name and being able to answer simple questions this morning.  Awaiting read on long-term EEG. Vitals   Vitals:   06/03/24 0002 06/03/24 0418 06/03/24 0722 06/03/24 0725  BP: (!) 148/110 (!) 149/95 (!) 160/102 (!) 142/100  Pulse: 85 92 93 86  Resp: 18 16 18    Temp: 99.8 F (37.7 C) 99 F (37.2 C) 100.1 F (37.8 C)   TempSrc: Oral Oral Oral   SpO2: 100% 98% 100% 100%  Weight:      Height:         Body mass index is 15.48 kg/m.  Physical Exam   Constitutional: Chronically ill-appearing patient in no acute distress Psych: Affect appropriate to situation.  Eyes: No scleral injection.  HENT: No OP obstrucion.  Head: Normocephalic.  Respiratory: Effort normal, non-labored breathing.  Skin: WDI.   Neurologic Examination    NEURO:  Mental Status: Patient is able to state her name but does not state location or time.  She is able to do simple addition but not subtraction.  She can name red-yellow and green fruits when asked Speech/Language: speech is with dysarthria  Cranial Nerves:  II: PERRL.  III, IV, VI: Looks around the room and regards examiner with intact extraocular movements VII: Smile is symmetrical.  VIII: hearing intact to voice. IX, X: Voice is slightly dysarthric Motor: Able to move right upper extremity with antigravity strength, does not move left upper extremity, able to move bilateral lower extremities with antigravity strength, right stronger the left Tone: is normal and bulk is normal Sensation- Intact to light touch bilaterally.   Gait- deferred   Medications  Current Facility-Administered Medications:    apixaban  (ELIQUIS ) tablet 5 mg, 5 mg, Oral, BID,  Bender, Emily, DO, 5 mg at 06/03/24 0800   levETIRAcetam  (KEPPRA ) undiluted injection 750 mg, 750 mg, Intravenous, Q12H, Bender, Emily, DO, 750 mg at 06/03/24 0754   LORazepam  (ATIVAN ) injection 0.5 mg, 0.5 mg, Intravenous, Q4H PRN, Aurora Lees, DO  Labs and Diagnostic Imaging   CBC:  Recent Labs  Lab 06/02/24 0513 06/03/24 0436  WBC 11.7* 6.4  NEUTROABS 10.4*  --   HGB 15.8* 15.8*  HCT 46.6* 46.7*  MCV 84.7 84.8  PLT 171 177    Basic Metabolic Panel:  Lab Results  Component Value Date   NA 138 06/03/2024   K 3.7 06/03/2024   CO2 23 06/03/2024   GLUCOSE 90 06/03/2024   BUN 12 06/03/2024   CREATININE 0.70 06/03/2024   CALCIUM 11.2 (H) 06/03/2024   GFRNONAA >60 06/03/2024   GFRAA 119 08/18/2020   Lipid Panel:  Lab Results  Component Value Date   LDLCALC 64 02/06/2022   HgbA1c:  Lab Results  Component Value Date   HGBA1C 5.6 02/06/2022   Urine Drug Screen:     Component Value Date/Time   LABOPIA NONE DETECTED 04/14/2024 1540   COCAINSCRNUR NONE DETECTED 04/14/2024 1540   LABBENZ POSITIVE (A) 04/14/2024 1540   AMPHETMU NONE DETECTED 04/14/2024 1540   THCU NONE DETECTED 04/14/2024 1540   LABBARB NONE DETECTED 04/14/2024 1540    Alcohol Level     Component Value Date/Time   [facility] <15 06/02/2024 0527   INR  Lab Results  Component  Value Date   INR 1.3 (H) 02/10/2024   APTT  Lab Results  Component Value Date   APTT 34 10/13/2023   AED levels: No results found for: PHENYTOIN, ZONISAMIDE, LAMOTRIGINE, LEVETIRACETA  CT Head without contrast(Personally reviewed): No acute intracranial abnormality   Continuous EEG:  Pending  Assessment   Sarah Myers is a 53 y.o. 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  Yesterday, patient was noted to be quite drowsy,  nonverbal and only able to follow commands intermittently.  She was placed on LTM EEG, awaiting read today.  Keppra  was increased to 750 mg twice daily given breakthrough seizure.  Today, she is much improved, able to state her name, perform simple addition and follow commands.  Feel that altered mental status yesterday was due to a postictal state which is now clearing.   LTM EEG showed a single seizure overnight. Will increase Keppra  to 1G BID.  Recommendations  increase Keppra  to 1000 mg twice daily - continue LTM EEG iovernight again. - Continue seizure precautions - Workup of comorbidities per primary team  ______________________________________________________________________   Signed, Cortney Alvin Axe, NP [facility]   NEUROHOSPITALIST ADDENDUM Performed a face to face diagnostic evaluation.   I have reviewed the contents of history and physical exam as documented by PA/ARNP/Resident and agree with above documentation.  I have discussed and formulated the above plan as documented. Edits to the note have been made as needed.  Heena Woodbury, MD [facility] [phone]   If 7pm to 7am, please call on call as listed on AMION.

## 2024-06-04 ENCOUNTER — Inpatient Hospital Stay (HOSPITAL_COMMUNITY)

## 2024-06-04 DIAGNOSIS — R569 Unspecified convulsions: Secondary | ICD-10-CM | POA: Diagnosis not present

## 2024-06-04 LAB — BASIC METABOLIC PANEL WITH GFR
Anion gap: 7 (ref 5–15)
BUN: 17 mg/dL (ref 6–20)
CO2: 24 mmol/L (ref 22–32)
Calcium: 10.8 mg/dL — ABNORMAL HIGH (ref 8.9–10.3)
Chloride: 106 mmol/L (ref 98–111)
Creatinine, Ser: 0.73 mg/dL (ref 0.44–1.00)
GFR, Estimated: >60 mL/min (ref >60)
Glucose, Bld: 71 mg/dL (ref 70–99)
Potassium: 3.6 mmol/L (ref 3.5–5.1)
Sodium: 137 mmol/L (ref 135–145)

## 2024-06-04 LAB — CBC
HCT: 41.8 % (ref 36.0–46.0)
Hemoglobin: 14.1 g/dL (ref 12.0–15.0)
MCH: 28.9 pg (ref 26.0–34.0)
MCHC: 33.7 g/dL (ref 30.0–36.0)
MCV: 85.7 fL (ref 80.0–100.0)
Platelets: 150 10*3/uL (ref 150–400)
RBC: 4.88 MIL/uL (ref 3.87–5.11)
RDW: 12.2 % (ref 11.5–15.5)
WBC: 5 10*3/uL (ref 4.0–10.5)
nRBC: 0 % (ref 0.0–0.2)

## 2024-06-04 LAB — PARATHYROID HORMONE, INTACT (NO CA): PTH: 49 pg/mL (ref 15–65)

## 2024-06-04 MED ORDER — LACTATED RINGERS IV BOLUS
1000.0000 mL | Freq: Once | INTRAVENOUS | Status: AC
  Administered 2024-06-04: 1000 mL via INTRAVENOUS

## 2024-06-04 MED ORDER — LEVETIRACETAM 500 MG PO TABS: 1000.0000 mg | ORAL_TABLET | Freq: Two times a day (BID) | ORAL | Status: DC

## 2024-06-04 MED ORDER — LEVETIRACETAM 1000 MG PO TABS: 1000.0000 mg | ORAL_TABLET | Freq: Two times a day (BID) | ORAL | 0 refills | Status: AC

## 2024-06-04 NOTE — Procedures (Signed)
 Patient Name: Sarah Myers  MRN: [id number]  Epilepsy Attending: Arleene Lack  Referring Physician/Provider: Khaliqdina, Salman, MD  Duration: 06/03/2024 1744 to 06/04/2024 1133   Patient history:  53 y.o. 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 EEG to evaluate for seizure.   Level of alertness: Awake, asleep   AEDs during EEG study: LEV, GBP   Technical aspects: This EEG study was done with scalp electrodes positioned according to the 10-20 International system of electrode placement. Electrical activity was reviewed with band pass filter of 1-70Hz , sensitivity of 7 uV/mm, display speed of 65mm/sec with a 60Hz  notched filter applied as appropriate. EEG data were recorded continuously and digitally stored.  Video monitoring was available and reviewed as appropriate.   Description:  The posterior dominant rhythm consists of 8-9 Hz activity of moderate voltage (25-35 uV) seen predominantly in posterior head regions, symmetric and reactive to eye opening and eye closing. Sleep was characterized by vertex waves, sleep spindles (12 to 14 Hz), maximal frontocentral region. Intermittent 3-5hz  theta-delta slowing was noted in left temporal region. Hyperventilation and photic stimulation were not performed.     ABNORMALITY - Intermittent slow, left temporal region   IMPRESSION: This study was suggestive of cortical dysfunction in left temporal region likely secondary to underlying structural abnormality. No seizure were noted.   Sarah Myers O Sarah Myers

## 2024-06-04 NOTE — Discharge Summary (Addendum)
 Name: Sarah Myers MRN: [id number] DOB: 1971-12-01 53 y.o. PCP: Karron Pagan, MD  Date of Admission: 06/02/2024  5:02 AM Date of Discharge:  06/04/2024 Attending Physician: Dr. Alwin Baars  DISCHARGE DIAGNOSIS:  Primary Problem: Post-ictal state [facility])   Hospital Problems: Principal Problem:   Post-ictal state (HCC)    DISCHARGE MEDICATIONS:   Allergies as of 06/04/2024       Reactions   Zestril  [lisinopril ] Cough        Medication List     PAUSE taking these medications    carvedilol  6.25 MG tablet Wait to take this until your doctor or other care provider tells you to start again. Commonly known as: COREG  Take 1 tablet (6.25 mg total) by mouth 2 (two) times daily with a meal.   hydrALAZINE  25 MG tablet Wait to take this until your doctor or other care provider tells you to start again. Commonly known as: APRESOLINE  Take 25 mg by mouth every 6 (six) hours.       TAKE these medications    acetaminophen  500 MG tablet Commonly known as: TYLENOL  Take 1,000 mg by mouth every 6 (six) hours as needed (pain).   amantadine  100 MG capsule Commonly known as: SYMMETREL  Take 100 mg by mouth 2 (two) times daily.   amLODipine  10 MG tablet Commonly known as: NORVASC  Take 1 tablet (10 mg total) by mouth daily.   apixaban  5 MG Tabs tablet Commonly known as: ELIQUIS  Take 1 tablet (5 mg total) by mouth 2 (two) times daily.   Baclofen  15 MG Tabs Take 15 mg by mouth 3 (three) times daily.   bisacodyl  10 MG suppository Commonly known as: DULCOLAX Place 1 suppository (10 mg total) rectally daily as needed for moderate constipation or mild constipation.   busPIRone  7.5 MG tablet Commonly known as: BUSPAR  Take 7.5 mg by mouth 2 (two) times daily.   cholecalciferol  25 MCG (1000 UNIT) tablet Commonly known as: VITAMIN D3 Take 1,000 Units by mouth daily.   famotidine  20 MG tablet Commonly known as: PEPCID  Take 1 tablet (20 mg total) by mouth 2 (two)  times daily.   gabapentin  100 MG capsule Commonly known as: NEURONTIN  Take 1 capsule (100 mg total) by mouth 2 (two) times daily.   levETIRAcetam  1000 MG tablet Commonly known as: KEPPRA  Take 1 tablet (1,000 mg total) by mouth 2 (two) times daily. What changed:  medication strength how much to take   losartan  25 MG tablet Commonly known as: COZAAR  Take 1 tablet (25 mg total) by mouth daily.   magnesium  gluconate 500 MG tablet Commonly known as: MAGONATE Take 1 tablet (500 mg total) by mouth at bedtime.   melatonin 3 MG Tabs tablet Take 3 mg by mouth at bedtime as needed (sleep).   mirtazapine  7.5 MG tablet Commonly known as: REMERON  Take 7.5 mg by mouth at bedtime.   multivitamin with minerals Tabs tablet Take 1 tablet by mouth daily.   Nutritional Drink Liqd Take 240 mLs by mouth 2 (two) times daily. House supplement   ondansetron  4 MG tablet Commonly known as: ZOFRAN  Take 1 tablet (4 mg total) by mouth every 8 (eight) hours as needed for nausea or vomiting.   polyethylene glycol 17 g packet Commonly known as: MIRALAX  / GLYCOLAX  Take 17 g by mouth daily.   saccharomyces boulardii 250 MG capsule Commonly known as: FLORASTOR Take 250 mg by mouth daily.   senna-docusate 8.6-50 MG tablet Commonly known as: Senokot-S Take 1 tablet by mouth  2 (two) times daily.   tiZANidine  2 MG tablet Commonly known as: ZANAFLEX  Take 2 mg by mouth at bedtime.        DISPOSITION AND FOLLOW-UP:  Ms.Torry T Marseille was discharged from [facility] in stable condition. At the hospital follow up visit please address:  Follow-up Recommendations: - Continue Keppra  1000 mg BID - Restart hydralazine  25 mg QID and carvedilol  6.25 mg BID as needed for BP control -Follow up PTH level and additional Hypercalcemia work up  Follow-up Appointments: - [facility] on 06/27/2024 @ 10:15AM  HOSPITAL COURSE:  Patient Summary: Sarah Myers is a 53 year-old female with PMH of nontraumatic intracranial hemorrhage with seizure disorder, spastic hemiplegia, and cognitive deficits, history of PE on Eliquis , hypertension, anxiety, and mood disorder who presented with seizures and admitted on 06/02/2024 for post-ictal state.   #Seizure Disorder #Previous Hemorrhagic CVA Patient presented after experiencing two minutes of generalized tonic-clonic seizure-like activity at SNF. Upon presentation to the ED, she was post-ictal and agitated, requiring Ativan  x1. Patient was noted to have edema/contusion on right forehead, so she underwent CT Head that was negative for acute intracranial abnormalities. Patient was loaded with IV Keppra  1000 mg x1, started on Keppra  750 mg BID (home dose 500 mg BID), and admitted for LTM EEG per Neurology recommendations. EEG then showed one sub-clinical seizure arising from left temporal region on 06/03/2024 at 0402 lasting for about 1 minute 10 seconds, so her Keppra  was increased to 1000 mg BID. Further EEG monitoring overnight without additional seizures. Presentation most likely breakthrough seizures, so neurology recommended continuing higher Keppra  dose of 1000 mg BID at discharge.   #Aspiration Pneumonitis  CT imaging obtained in ED showed patchy ground-glass opacities at both lung bases. Suspect opacities were secondary to atelectasis vs aspiration pneumonitis. Patient remained asymptomatic (afebrile, normal SpO2, no focal findings on lung exam), so antibiotics were not started.  #Hypertension Antihypertensive medications were initially held since patient was NPO until passing bedside speech evaluation. Blood pressures then elevated to SBPs 142-170, so her home losartan  25 mg daily and amlodipine  10 mg daily were restarted. Blood pressures better controlled with SBPs 106-123, so her home hydralazine  25 mg QID and carvedilol  6.25 mg BID were held at discharge.   #Hypercalcemia Calcium was elevated  between 10.4-11.2 while hospitalized. Workup was started by obtaining PTH level, which was pending at discharge.  #Anxiety, Depression, Mood Disorder  Psychiatric medications were initially held since patient was NPO until passing bedside speech evaluation. Her home amantadine  100 mg BID, baclofen  15 mg TID PRN, buspirone  7.5 mg BID PRN, gabapentin  100 mg BID, mirtazapine  7.5 mg nightly, and tizanidine  2 mg nightly were restarted on 6/17.  #History of PE on Eliquis   Patient was diagnosed with bilateral pulmonary emboli without right heart strain after CVA in 02/2022. Her home Eliquis  5 mg BID was continued while hospitalized.  #Dysuria Patient reported dysuria in the ED. Initial UA with rare bacteria but no leukocytes or nitrites. Repeat UA with 80 ketones, 30 protein, and no bacteria, leukocytes, or nitrites, so antibiotics were not started.  #Elevated Troponin Trended troponin until flat (24->50->26). Suspect troponin elevated due to demand ischemia/hypoxia from seizure.   #Prolonged QTc Initial EKG in the ED with QTc 630 ms. Repeat EKG with QTc 446 ms.   SUBJECTIVE:  No significant events overnight. Patient is feeling back to baseline this morning. She reports normal appetite. She denies any pain. She has no questions or  concerns.  Discharge Vitals:   BP 133/84 (BP Location: Right Arm)   Pulse 66   Temp 98.8 F (37.1 C) (Oral)   Resp 20   Ht 5' 5 (1.651 m)   Wt 42.2 kg   SpO2 100%   BMI 15.48 kg/m   OBJECTIVE:  General: Chronically ill-appearing female lying comfortably in bed looking out window, in NAD HEENT: EEG leads on scalp. Improving edema/contusion on right forehead. Conjunctivae clear. No nasal congestion or rhinorrhea. MMM. Cardiovascular: Normal rate with regular rhythm. No murmurs, rubs, or gallops.  Pulmonary: Lungs CTAB. No wheezes, rales, or rhonchi. Normal respiratory effort on room air. Abdominal: Normal bowel sounds.  Musculoskeletal: Left arm and leg  spasticity/contractures.   Neurological: Alert and oriented to person (Name and DOB), place (hospital in [place]), and month (June). Stated the year as 2021. Skin: Warm and dry. Cap refill <2 seconds.  Pertinent Labs, Studies, and Procedures:     Latest Ref Rng & Units 06/04/2024    4:55 AM 06/03/2024    4:36 AM 06/02/2024    5:13 AM  CBC  WBC 4.0 - 10.5 K/uL 5.0  6.4  11.7   Hemoglobin 12.0 - 15.0 g/dL 30.8  65.7  84.6   Hematocrit 36.0 - 46.0 % 41.8  46.7  46.6   Platelets 150 - 400 K/uL 150  177  171        Latest Ref Rng & Units 06/04/2024    4:55 AM 06/03/2024    4:36 AM 06/02/2024    6:50 AM  CMP  Glucose 70 - 99 mg/dL 71  90  83   BUN 6 - 20 mg/dL 17  12  13    Creatinine 0.44 - 1.00 mg/dL 9.62  9.52  8.41   Sodium 135 - 145 mmol/L 137  138  140   Potassium 3.5 - 5.1 mmol/L 3.6  3.7  4.1   Chloride 98 - 111 mmol/L 106  106  108   CO2 22 - 32 mmol/L 24  23  21    Calcium 8.9 - 10.3 mg/dL 32.4  40.1  02.7   Total Protein 6.5 - 8.1 g/dL   7.5   Total Bilirubin 0.0 - 1.2 mg/dL   0.9   Alkaline Phos 38 - 126 U/L   73   AST 15 - 41 U/L   27   ALT 0 - 44 U/L   19     Overnight EEG with video Result Date: 06/03/2024 IMPRESSION: This study showed one seizure without clinical signs arising from left temporal region on 06/03/2024 at 0402 lasting for about 1 minute 10 seconds. Additionally there was cortical dysfunction in left temporal region likely secondary to underlying structural abnormality.  Arleene Lack   CT ABDOMEN PELVIS W CONTRAST Result Date: 06/02/2024 FINDINGS: Technical note: Examination was performed with the patient in the left lateral decubitus position. Lower chest: Patchy ground-glass opacities at both lung bases with probable linear atelectasis in the left lower lobe. No confluent airspace disease or significant pleural effusion. Hepatobiliary: The liver is normal in density without suspicious focal abnormality. No evidence of gallstones, gallbladder wall  thickening or biliary dilatation. Pancreas: Unremarkable. No pancreatic ductal dilatation or surrounding inflammatory changes. Spleen: Normal in size without focal abnormality. Adrenals/Urinary Tract: Both adrenal glands appear normal. No evidence of urinary tract calculus, suspicious renal lesion or hydronephrosis. Stable small cyst in the upper pole of the left kidney for which no specific follow-up imaging is recommended. The bladder appears  unremarkable for its degree of distention. Stomach/Bowel: No enteric contrast administered. The stomach appears unremarkable for its degree of distension. No evidence of bowel wall thickening, distention or surrounding inflammatory change. Interval improved colonic stool burden with less prominent stool in the rectum. Mild distal colonic diverticulosis. Vascular/Lymphatic: There are no enlarged abdominal or pelvic lymph nodes. Minimal iliac atherosclerosis. No acute vascular findings. Reproductive: The uterus and ovaries appear unremarkable. No adnexal mass. Other: No evidence of abdominal wall mass or hernia. No ascites or pneumoperitoneum. Musculoskeletal: No acute or significant osseous findings. IMPRESSION: 1. No acute findings or explanation for the patient's symptoms in the abdomen or pelvis. 2. Interval improved colonic stool burden. 3. Patchy ground-glass opacities at both lung bases, nonspecific but possibly infectious or inflammatory. Electronically Signed   By: Elmon Hagedorn M.D.   On: 06/02/2024 10:07   CT Head Wo Contrast Result Date: 06/02/2024 FINDINGS: Brain: Chronic encephalomalacia in the left occipital lobe, dorsal right thalamus. Stable cerebral volume. No midline shift, ventriculomegaly, mass effect, evidence of mass lesion, intracranial hemorrhage or evidence of cortically based acute infarction. Vascular: No suspicious intracranial vascular hyperdensity. Mild basal ganglia vascular calcifications again noted. Skull: Stable.  No acute osseous  abnormality identified. 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 Otherwise Visualized paranasal sinuses and mastoids are stable and well aerated. Other: Broad-based right forehead soft tissue swelling on series 3, image 40 is new. No scalp soft tissue gas. Visualized orbit soft tissues are within normal limits. IMPRESSION: 1. Right forehead scalp soft tissue swelling. No skull fracture identified. 2. No acute intracranial abnormality. Chronic encephalomalacia in the left occipital lobe, dorsal right thalamus. 3. Chronic calcified cribriform plate encephaloceles. Electronically Signed   By: Marlise Simpers M.D.   On: 06/02/2024 09:47   DG Chest Port 1 View Result Date: 06/02/2024 FINDINGS: Portable AP supine view at 0604 hours. Lung volumes are stable, within normal limits. Normal cardiac size and mediastinal contours. Visualized tracheal air column is within normal limits. Allowing for portable technique the lungs are clear. No pneumothorax or pleural effusion. No acute osseous abnormality identified. Negative visible bowel gas. IMPRESSION: Negative portable chest. Electronically Signed   By: Marlise Simpers M.D.   On: 06/02/2024 06:22     Signed: Aurora Lees, DO Internal Medicine Resident: [email or web address]-1  Please contact the on call pager at: [phone]

## 2024-06-04 NOTE — Progress Notes (Signed)
 NEUROLOGY CONSULT FOLLOW UP NOTE   Date of service: June 04, 2024 Patient Name: Sarah Myers MRN:  [id number] DOB:  07-May-1971  Interval Hx/subjective  Improved mentation. More awake. No seizures on LTM overnight.  Vitals   Vitals:   06/03/24 2326 06/04/24 0349 06/04/24 0801 06/04/24 1127  BP: 119/85 115/84 133/84 119/77  Pulse: (!) 59 65 66 73  Resp: 16 16 20 20   Temp: 97.8 F (36.6 C) 97.6 F (36.4 C) 98.8 F (37.1 C) 99 F (37.2 C)  TempSrc: Oral Oral Oral Oral  SpO2: 99% 99% 100% 100%  Weight:      Height:         Body mass index is 15.48 kg/m.  Physical Exam   Constitutional: Chronically ill-appearing patient in no acute distress Psych: Affect appropriate to situation.  Eyes: No scleral injection.  HENT: No OP obstrucion.  Head: Normocephalic.  Respiratory: Effort normal, non-labored breathing.  Skin: WDI.   Neurologic Examination    NEURO:  Mental Status: Patient is able to state her name, knows she is in a hospital and had a seizure.  She is able to do simple addition but not subtraction.  She can name red-yellow and green fruits when asked Speech/Language: speech is with dysarthria  Cranial Nerves:  II: PERRL.  III, IV, VI: Looks around the room and regards examiner with intact extraocular movements VII: L facial droop.  VIII: hearing intact to voice. IX, X: Voice is slightly dysarthric Motor: Able to move right upper extremity with antigravity strength, does not move left upper extremity, able to move bilateral lower extremities with antigravity strength, right stronger the left Tone: is normal and bulk is poor Sensation- Intact to light touch bilaterally.   Gait- deferred   Medications  Current Facility-Administered Medications:    amantadine  (SYMMETREL ) capsule 100 mg, 100 mg, Oral, BID, Bender, Emily, DO, 100 mg at 06/04/24 0946   amLODipine  (NORVASC ) tablet 10 mg, 10 mg, Oral, Daily, Bender, Emily, DO, 10 mg at 06/04/24 0947   apixaban   (ELIQUIS ) tablet 5 mg, 5 mg, Oral, BID, Bender, Emily, DO, 5 mg at 06/04/24 0946   baclofen  (LIORESAL ) tablet 15 mg, 15 mg, Oral, TID PRN, Sharlon Deacon, Emily, DO   busPIRone  (BUSPAR ) tablet 7.5 mg, 7.5 mg, Oral, BID PRN, Bender, Emily, DO   gabapentin  (NEURONTIN ) capsule 100 mg, 100 mg, Oral, BID, Bender, Emily, DO, 100 mg at 06/04/24 0946   levETIRAcetam  (KEPPRA ) tablet 1,000 mg, 1,000 mg, Oral, BID, Bender, Emily, DO   LORazepam  (ATIVAN ) injection 0.5 mg, 0.5 mg, Intravenous, Q4H PRN, Bender, Emily, DO   losartan  (COZAAR ) tablet 25 mg, 25 mg, Oral, Daily, Bender, Emily, DO, 25 mg at 06/04/24 0946   mirtazapine  (REMERON ) tablet 7.5 mg, 7.5 mg, Oral, QHS, Bender, Emily, DO, 7.5 mg at 06/03/24 2024   polyethylene glycol (MIRALAX  / GLYCOLAX ) packet 17 g, 17 g, Oral, Daily, Bender, Emily, DO, 17 g at 06/03/24 1139   senna-docusate (Senokot-S) tablet 1 tablet, 1 tablet, Oral, BID, Bender, Emily, DO, 1 tablet at 06/04/24 0946   tiZANidine  (ZANAFLEX ) tablet 2 mg, 2 mg, Oral, QHS, Bender, Emily, DO, 2 mg at 06/03/24 2023  Labs and Diagnostic Imaging   CBC:  Recent Labs  Lab 06/02/24 0513 06/03/24 0436 06/04/24 0455  WBC 11.7* 6.4 5.0  NEUTROABS 10.4*  --   --   HGB 15.8* 15.8* 14.1  HCT 46.6* 46.7* 41.8  MCV 84.7 84.8 85.7  PLT 171 177 150    Basic Metabolic  Panel:  Lab Results  Component Value Date   NA 137 06/04/2024   K 3.6 06/04/2024   CO2 24 06/04/2024   GLUCOSE 71 06/04/2024   BUN 17 06/04/2024   CREATININE 0.73 06/04/2024   CALCIUM 10.8 (H) 06/04/2024   GFRNONAA >60 06/04/2024   GFRAA 119 08/18/2020   Lipid Panel:  Lab Results  Component Value Date   LDLCALC 64 02/06/2022   HgbA1c:  Lab Results  Component Value Date   HGBA1C 5.6 02/06/2022   Urine Drug Screen:     Component Value Date/Time   LABOPIA NONE DETECTED 04/14/2024 1540   COCAINSCRNUR NONE DETECTED 04/14/2024 1540   LABBENZ POSITIVE (A) 04/14/2024 1540   AMPHETMU NONE DETECTED 04/14/2024 1540   THCU NONE  DETECTED 04/14/2024 1540   LABBARB NONE DETECTED 04/14/2024 1540    Alcohol Level     Component Value Date/Time   ETH <15 06/02/2024 0527   INR  Lab Results  Component Value Date   INR 1.3 (H) 02/10/2024   APTT  Lab Results  Component Value Date   APTT 34 10/13/2023   AED levels: No results found for: PHENYTOIN, ZONISAMIDE, LAMOTRIGINE, LEVETIRACETA  CT Head without contrast(Personally reviewed): No acute intracranial abnormality   Continuous EEG:  This study was suggestive of cortical dysfunction in left temporal region likely secondary to underlying structural abnormality. No seizure were noted.  Assessment   Sarah Myers is a 53 y.o. 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  Yesterday, patient was noted to be quite drowsy, nonverbal and only able to follow commands intermittently.  She was placed on LTM EEG, awaiting read today.  Keppra  was increased to 750 mg twice daily given breakthrough seizure.  Today, she is much improved, able to state her name, perform simple addition and follow commands.  Feel that altered mental status yesterday was due to a postictal state which is now clearing. LTM showed a single subclinical seizure overnight on 06/03/24. Kept on LTM for additional 24 hours with no further seizures.   Recommendations  - Continue Keppra  to 1000 mg twice daily - discontinue LTM EEG. - Continue seizure precautions - Neurology will signoff.  ______________________________________________________________________   Plan discussed with patient and with   Odarius Dines, MD [facility] [phone]   If 7pm to 7am, please call on call as listed on AMION.

## 2024-06-04 NOTE — Progress Notes (Signed)
 LTM maint complete - no skin breakdown under:  F8,T7

## 2024-06-04 NOTE — Progress Notes (Signed)
vLTM discontinued.  [facility] notified.  No skin breakdown noted at all skin sites. ?

## 2024-06-04 NOTE — TOC Transition Note (Signed)
 Transition of Care [facility]) - Discharge Note   Patient Details  Name: Sarah Myers MRN: [id number] Date of Birth: [birth year]  [facility] of [facility]) CM/SW Contact:  Tandy Fam, LCSW Phone Number: 06/04/2024, 11:34 AM   Clinical Narrative:   CSW spoke with daughter, Cain Castillo, to confirm plan is to return to [facility], where patient resides in LTC. CSW updated [facility], patient can return. CSW sent discharge information and confirmed receipt. Transport arranged with [facility] for next available.  Nurse to call report to [phone], Room 232A    Final next level of care: Skilled Nursing Facility Barriers to Discharge: Barriers Resolved   Patient Goals and CMS Choice Patient states their goals for this hospitalization and ongoing recovery are:: patient unable to participate in goal setting, not fully oriented CMS Medicare.gov Compare Post Acute Care list provided to:: Patient Represenative (must comment) Choice offered to / list presented to : Adult Children [place] ownership interest in [facility].provided to:: Adult Children    Discharge Placement              Patient chooses bed at:  [facility]) Patient to be transferred to facility by: [facility] Name of family member notified: Cain Castillo Patient and family notified of of transfer: 06/04/24  Discharge Plan and Services Additional resources added to the After Visit Summary for                                       Social Drivers of Health (SDOH) Interventions SDOH Screenings   Food Insecurity: Patient Unable To Answer (06/04/2024)  Housing: Low Risk  (06/04/2024)  Transportation Needs: No Transportation Needs (06/04/2024)  Utilities: Not At Risk (06/04/2024)  Depression (PHQ2-9): Low Risk  (08/23/2023)  Tobacco Use: Low Risk  (06/02/2024)     Readmission Risk Interventions    07/05/2022   11:29 AM 06/30/2022   12:52 PM 03/09/2022    9:54 AM  Readmission Risk Prevention Plan   Transportation Screening Complete Complete Complete  PCP or Specialist Appt within 5-7 Days  Complete   PCP or Specialist Appt within 3-5 Days Complete  Complete  Home Care Screening  Complete   Medication Review (RN CM)  Complete   HRI or Home Care Consult Complete  Not Complete  Social Work Consult for Recovery Care Planning/Counseling Complete  Complete  Palliative Care Screening Complete    Medication Review Oceanographer) Complete  Complete

## 2024-06-04 NOTE — Discharge Instructions (Addendum)
 It was a pleasure taking care of you in [facility]! You were hospitalized for seizures. Your brain activity was monitored with EEG, which showed no seizures, so your Keppra  dose was increased to 1000 mg BID. You did not have any more seizures after starting this higher dose. Please take this medication every morning and every evening to prevent future seizures. You already have a follow-up appointment with Neurology scheduled on 06/27/2024 @ 10:15AM. While hospitalized, your blood pressure was well-controlled on your home losartan  25 mg daily and amlodipine  10 mg, so we held your home hydralazine  25 mg QID and carvedilol  6.25 mg BID. Your primary doctor can resume these medications as needed for blood pressure control.

## 2024-06-04 NOTE — Plan of Care (Signed)
  Problem: Education: Goal: Knowledge of General Education information will improve Description: Including pain rating scale, medication(s)/side effects and non-pharmacologic comfort measures Outcome: Progressing   Problem: Health Behavior/Discharge Planning: Goal: Ability to manage health-related needs will improve Outcome: Progressing   Problem: Nutrition: Goal: Adequate nutrition will be maintained Outcome: Progressing   Problem: Elimination: Goal: Will not experience complications related to bowel motility Outcome: Progressing Goal: Will not experience complications related to urinary retention Outcome: Progressing   Problem: Pain Managment: Goal: General experience of comfort will improve and/or be controlled Outcome: Progressing   Problem: Safety: Goal: Ability to remain free from injury will improve Outcome: Progressing   Problem: Skin Integrity: Goal: Risk for impaired skin integrity will decrease Outcome: Progressing

## 2024-06-04 NOTE — NC FL2 (Signed)
 [place]  MEDICAID FL2 LEVEL OF CARE FORM     IDENTIFICATION  Patient Name: Sarah Myers Birthdate: [birth year] Sex: female Admission Date (Current Location): 06/02/2024  [facility] and [place] Number:  Producer, television/film/video and Address:  [facility], [address] [place], [place] [address]      Provider Number: [id number]  Attending Physician Name and Address:  Sandie Cross, MD  Relative Name and Phone Number:       Current Level of Care: Hospital Recommended Level of Care: Skilled Nursing Facility Prior Approval Number:    Date Approved/Denied:   PASRR Number: [id number]  Discharge Plan: SNF    Current Diagnoses: Patient Active Problem List   Diagnosis Date Noted   Post-ictal state [facility]) 06/02/2024   Seizure (HCC) 04/14/2024   AMS (altered mental status) 10/13/2023   Constipation 10/13/2023   Hallucination, visual 12/12/2022   Acute cystitis 11/28/2022   Hypokalemia 11/28/2022   COVID-19 11/27/2022   Palliative care by specialist    Goals of care, counseling/discussion    Acute encephalopathy 06/28/2022   Bacteremia 06/28/2022   Metabolic acidosis 06/28/2022   Sepsis due to gram-negative UTI (HCC) 06/24/2022   Essential hypertension 06/24/2022   History of stroke 06/24/2022   History of pulmonary embolism 06/24/2022   Anxiety and depression 06/24/2022   Acute metabolic encephalopathy 06/24/2022   Altered mental status 06/23/2022   ICH (intracerebral hemorrhage) (HCC) 04/04/2022   Hypercalcemia 03/21/2022   Oral thrush 03/13/2022   Dysphagia 03/08/2022   Counseling regarding goals of care 03/08/2022   Tracheostomy dependent (HCC)    Bilateral pulmonary embolism (HCC)    AKI (acute kidney injury) (HCC)    Acute on chronic respiratory failure with hypoxia (HCC)    Pressure injury of skin 02/07/2022   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/05/2022   Iron deficiency anemia 06/30/2020   Vitamin D   deficiency 06/30/2020   GAD (generalized anxiety disorder) 05/18/2020   Malignant hypertension 10/30/2019    Orientation RESPIRATION BLADDER Height & Weight     Self, Place  Normal Incontinent Weight: 93 lb (42.2 kg) Height:  5' 5 (165.1 cm)  BEHAVIORAL SYMPTOMS/MOOD NEUROLOGICAL BOWEL NUTRITION STATUS    Convulsions/Seizures Incontinent Diet (see DC summary)  AMBULATORY STATUS COMMUNICATION OF NEEDS Skin   Extensive Assist Verbally Normal                       Personal Care Assistance Level of Assistance  Bathing, Feeding, Dressing Bathing Assistance: Maximum assistance Feeding assistance: Limited assistance Dressing Assistance: Maximum assistance     Functional Limitations Info  Speech     Speech Info: Impaired (delayed responses)    SPECIAL CARE FACTORS FREQUENCY                       Contractures Contractures Info: Not present    Additional Factors Info  Code Status, Allergies, Psychotropic Code Status Info: Full Allergies Info: Zestril  (Lisinopril ) Psychotropic Info: Remeron  7.5mg  daily at bed; Buspar  7.5mg  2x/day PRN         Current Medications (06/04/2024):  This is the current hospital active medication list Current Facility-Administered Medications  Medication Dose Route Frequency Provider Last Rate Last Admin   amantadine  (SYMMETREL ) capsule 100 mg  100 mg Oral BID Aurora Lees, DO   100 mg at 06/04/24 0946   amLODipine  (NORVASC ) tablet 10 mg  10 mg Oral Daily Aurora Lees, DO  10 mg at 06/04/24 1610   apixaban  (ELIQUIS ) tablet 5 mg  5 mg Oral BID Aurora Lees, DO   5 mg at 06/04/24 9604   baclofen  (LIORESAL ) tablet 15 mg  15 mg Oral TID PRN Aurora Lees, DO       busPIRone  (BUSPAR ) tablet 7.5 mg  7.5 mg Oral BID PRN Aurora Lees, DO       gabapentin  (NEURONTIN ) capsule 100 mg  100 mg Oral BID Aurora Lees, DO   100 mg at 06/04/24 0946   levETIRAcetam  (KEPPRA ) tablet 1,000 mg  1,000 mg Oral BID Aurora Lees, DO       LORazepam   (ATIVAN ) injection 0.5 mg  0.5 mg Intravenous Q4H PRN Aurora Lees, DO       losartan  (COZAAR ) tablet 25 mg  25 mg Oral Daily Bender, Emily, DO   25 mg at 06/04/24 0946   mirtazapine  (REMERON ) tablet 7.5 mg  7.5 mg Oral QHS Bender, Emily, DO   7.5 mg at 06/03/24 2024   polyethylene glycol (MIRALAX  / GLYCOLAX ) packet 17 g  17 g Oral Daily Aurora Lees, DO   17 g at 06/03/24 1139   senna-docusate (Senokot-S) tablet 1 tablet  1 tablet Oral BID Aurora Lees, DO   1 tablet at 06/04/24 0946   tiZANidine  (ZANAFLEX ) tablet 2 mg  2 mg Oral QHS Aurora Lees, DO   2 mg at 06/03/24 2023     Discharge Medications: Please see discharge summary for a list of discharge medications.  Relevant Imaging Results:  Relevant Lab Results:   Additional Information SS#: [id number]  Tandy Fam, LCSW

## 2024-06-07 LAB — CULTURE, BLOOD (ROUTINE X 2): Culture: NO GROWTH

## 2024-06-27 ENCOUNTER — Ambulatory Visit (INDEPENDENT_AMBULATORY_CARE_PROVIDER_SITE_OTHER): Admitting: Neurology

## 2024-06-27 ENCOUNTER — Encounter: Payer: Self-pay | Admitting: Neurology

## 2024-06-27 VITALS — BP 138/87 | HR 69 | Resp 14 | Ht 63.0 in

## 2024-06-27 DIAGNOSIS — G40009 Localization-related (focal) (partial) idiopathic epilepsy and epileptic syndromes with seizures of localized onset, not intractable, without status epilepticus: Secondary | ICD-10-CM | POA: Diagnosis not present

## 2024-06-27 DIAGNOSIS — I619 Nontraumatic intracerebral hemorrhage, unspecified: Secondary | ICD-10-CM

## 2024-06-27 DIAGNOSIS — G8194 Hemiplegia, unspecified affecting left nondominant side: Secondary | ICD-10-CM

## 2024-06-27 NOTE — Progress Notes (Unsigned)
 [facility]  PATIENT: Sarah Myers DOB: 09/12/1971  REQUESTING CLINICIAN: Judeth Trenda BIRCH, MD HISTORY FROM: Patient  REASON FOR VISIT: Patient/Chart review    HISTORICAL  CHIEF COMPLAINT:  Chief Complaint  Patient presents with   New Patient (Initial Visit)    Rm13, transporter from facility present, NP/internal hospital referral for seizures, hx stroke d/c to [facility],    Seizures    Rm13, transporter from facility present, Sz: pt nor caregiver knows when last sz was, but according to records it was 06/02/24 in the er   Cerebrovascular Accident    Rm13, transporter from facility present,Cva: pt stated that since the stroke she has basically been paralyzed on left side     HISTORY OF PRESENT ILLNESS:   Discussed the use of AI scribe software for clinical note transcription with the patient, who gave verbal consent to proceed.  History of Present Illness    The patient is a 53 year old woman with a history of stroke with residual left hemiplegia, wheelchair bound, seizure, hypertension, heart disease who presents for evaluation and management of her seizures. She is accompanied by a caregiver from her facility.  She reports that her seizures started after her stroke in 2023, experiences convulsive seizures characterized by eye twitching. She does not recall the onset of her seizures and reports memory issues. Hospitalization has occurred following seizures, last one was in June, and during that hospitalization her EEG captured seizure activity even without physical convulsions. She presented to the ED due to convulsion at the nursing home, and confusion, She is on Keppra  (levetiracetam ) 1000 mg twice daily, which causes fatigue but not dizziness.  She has a history of stroke, resulting in impaired mobility and necessitating care at a facility. Before the stroke, she had no significant medical issues and worked as a Engineer, structural. No seizures have been  observed at the facility, but others have witnessed them.  She struggles with balance, feeling at risk of falling when standing for extended periods. She can stand but not for long due to balance issues. She wishes to return home to live with her family, including her daughter, mother, and uncle.   Brief hospital summary  Patient presented after experiencing two minutes of generalized tonic-clonic seizure-like activity at SNF. Upon presentation to the ED, she was post-ictal and agitated, requiring Ativan  x1. Patient was noted to have edema/contusion on right forehead, so she underwent CT Head that was negative for acute intracranial abnormalities. Patient was loaded with IV Keppra  1000 mg x1, started on Keppra  750 mg BID (home dose 500 mg BID), and admitted for LTM EEG per Neurology recommendations. EEG then showed one sub-clinical seizure arising from left temporal region on 06/03/2024 at 0402 lasting for about 1 minute 10 seconds, so her Keppra  was increased to 1000 mg BID. Further EEG monitoring overnight without additional seizures. Presentation most likely breakthrough seizures, so neurology recommended continuing higher Keppra  dose of 1000 mg BID at discharge.    Handedness: Left handed   Onset: After her stroke in 2023  Seizure Type: Generalized convulsion with eye twitching   Current frequency: Last admission for seizure in June. Found to have non convulsive seizures  Any injuries from seizures: Denies   Seizure risk factors: Stroke   Previous ASMs: Levetiracetam    Currenty ASMs: Levetiracetam  1000 mg twice daily   ASMs side effects: Denies   Brain Images: Chronic encephalomalacia in the left occipital lobe, dorsal right thalamus  Previous EEGs: Left hemispheric subclinical  seizures    OTHER MEDICAL CONDITIONS: Stroke 2023 with residual left hemiplegia, Hypertension, Depression, Heart disease  REVIEW OF SYSTEMS: Full 14 system review of systems performed and negative with  exception of: As noted in the HPI   ALLERGIES: Allergies  Allergen Reactions   Zestril  [Lisinopril ] Cough    HOME MEDICATIONS: Outpatient Medications Prior to Visit  Medication Sig Dispense Refill   acetaminophen  (TYLENOL ) 500 MG tablet Take 1,000 mg by mouth every 6 (six) hours as needed (pain).     amantadine  (SYMMETREL ) 100 MG capsule Take 100 mg by mouth 2 (two) times daily.     amLODipine  (NORVASC ) 10 MG tablet Take 1 tablet (10 mg total) by mouth daily. 90 tablet 3   apixaban  (ELIQUIS ) 5 MG TABS tablet Take 1 tablet (5 mg total) by mouth 2 (two) times daily. 60 tablet 1   Baclofen  15 MG TABS Take 15 mg by mouth 3 (three) times daily.     bisacodyl  (DULCOLAX) 10 MG suppository Place 1 suppository (10 mg total) rectally daily as needed for moderate constipation or mild constipation.     busPIRone  (BUSPAR ) 7.5 MG tablet Take 7.5 mg by mouth 2 (two) times daily.     cholecalciferol  (VITAMIN D3) 25 MCG (1000 UNIT) tablet Take 1,000 Units by mouth daily.     famotidine  (PEPCID ) 20 MG tablet Take 1 tablet (20 mg total) by mouth 2 (two) times daily. 60 tablet 0   gabapentin  (NEURONTIN ) 100 MG capsule Take 1 capsule (100 mg total) by mouth 2 (two) times daily. 60 capsule 0   hydrALAZINE  (APRESOLINE ) 25 MG tablet Take 25 mg by mouth every 6 (six) hours.     levETIRAcetam  (KEPPRA ) 1000 MG tablet Take 1 tablet (1,000 mg total) by mouth 2 (two) times daily. 60 tablet 0   losartan  (COZAAR ) 25 MG tablet Take 1 tablet (25 mg total) by mouth daily. 90 tablet 3   magnesium  gluconate (MAGONATE) 500 MG tablet Take 1 tablet (500 mg total) by mouth at bedtime. 30 tablet 0   melatonin 3 MG TABS tablet Take 3 mg by mouth at bedtime as needed (sleep).     mirtazapine  (REMERON ) 7.5 MG tablet Take 7.5 mg by mouth at bedtime.     Multiple Vitamin (MULTIVITAMIN WITH MINERALS) TABS tablet Take 1 tablet by mouth daily. 30 tablet 0   Nutritional Supplements (NUTRITIONAL DRINK) LIQD Take 240 mLs by mouth 2 (two)  times daily. House supplement     ondansetron  (ZOFRAN ) 4 MG tablet Take 1 tablet (4 mg total) by mouth every 8 (eight) hours as needed for nausea or vomiting. 12 tablet 0   polyethylene glycol (MIRALAX  / GLYCOLAX ) 17 g packet Take 17 g by mouth daily.     saccharomyces boulardii (FLORASTOR) 250 MG capsule Take 250 mg by mouth daily.     senna-docusate (SENOKOT-S) 8.6-50 MG tablet Take 1 tablet by mouth 2 (two) times daily.     tiZANidine  (ZANAFLEX ) 2 MG tablet Take 2 mg by mouth at bedtime.     carvedilol  (COREG ) 6.25 MG tablet Take 1 tablet (6.25 mg total) by mouth 2 (two) times daily with a meal. (Patient not taking: Reported on 06/27/2024) 180 tablet 3   No facility-administered medications prior to visit.    PAST MEDICAL HISTORY: Past Medical History:  Diagnosis Date   Hypertension    Stroke [facility])     PAST SURGICAL HISTORY: Past Surgical History:  Procedure Laterality Date   BREAST LUMPECTOMY  1992  CESAREAN SECTION     IR GASTROSTOMY TUBE MOD SED  03/06/2022   ORIF RADIAL FRACTURE  02/10/2012   Procedure: OPEN REDUCTION INTERNAL FIXATION (ORIF) RADIAL FRACTURE;  Surgeon: Cordella Glendia Hutchinson, MD;  Location: [facility] OR;  Service: Orthopedics;  Laterality: Right;   ORIF ULNAR FRACTURE  02/10/2012   Procedure: OPEN REDUCTION INTERNAL FIXATION (ORIF) ULNAR FRACTURE;  Surgeon: Cordella Glendia Hutchinson, MD;  Location: [facility] OR;  Service: Orthopedics;  Laterality: Right;   TUBAL LIGATION      FAMILY HISTORY: Family History  Problem Relation Age of Onset   Diabetes Mother    Hypertension Mother    Heart disease Mother    Diabetes Maternal Aunt    Stroke Maternal Uncle    Diabetes Maternal Grandmother    Heart disease Maternal Grandmother    Diabetes Maternal Grandfather    Heart disease Maternal Grandfather    Heart disease Paternal Grandmother    Heart disease Paternal Grandfather     SOCIAL HISTORY: Social History   Socioeconomic History   Marital status: Widowed    Spouse name: Not  on file   Number of children: Not on file   Years of education: Not on file   Highest education level: Not on file  Occupational History   Not on file  Tobacco Use   Smoking status: Never   Smokeless tobacco: Never  Vaping Use   Vaping status: Never Used  Substance and Sexual Activity   Alcohol use: Not Currently    Alcohol/week: 0.0 standard drinks of alcohol   Drug use: No   Sexual activity: Never  Other Topics Concern   Not on file  Social History Narrative   Not on file   Social Drivers of Health   Financial Resource Strain: Not on file  Food Insecurity: Patient Unable To Answer (06/04/2024)   Hunger Vital Sign    Worried About Running Out of Food in the Last Year: Patient unable to answer    Ran Out of Food in the Last Year: Patient unable to answer  Transportation Needs: No Transportation Needs (06/04/2024)   PRAPARE - Administrator, Civil Service (Medical): No    Lack of Transportation (Non-Medical): No  Physical Activity: Not on file  Stress: Not on file  Social Connections: Not on file  Intimate Partner Violence: Patient Unable To Answer (06/04/2024)   Humiliation, Afraid, Rape, and Kick questionnaire    Fear of Current or Ex-Partner: Patient unable to answer    Emotionally Abused: Patient unable to answer    Physically Abused: Patient unable to answer    Sexually Abused: Patient unable to answer    PHYSICAL EXAM  GENERAL EXAM/CONSTITUTIONAL: Vitals:  Vitals:   06/27/24 0953  BP: 138/87  Pulse: 69  Resp: 14  Height: 5' 3 (1.6 m)   Body mass index is 16.47 kg/m. Wt Readings from Last 3 Encounters:  06/02/24 93 lb (42.2 kg)  04/14/24 92 lb 9.5 oz (42 kg)  02/21/24 92 lb (41.7 kg)   Patient is in no distress; well developed, nourished and groomed; neck is supple  MUSCULOSKELETAL: Gait, strength, tone, movements noted in Neurologic exam below  NEUROLOGIC: MENTAL STATUS:      No data to display         awake, alert, oriented to  person, place and time recent and remote memory intact normal attention and concentration language fluent, comprehension intact, naming intact fund of knowledge appropriate  CRANIAL NERVE:  2nd, 3rd, 4th, 6th -  Decrease visual acuity due to not wearing glasses, extraocular muscles intact, no nystagmus 5th - facial sensation symmetric 7th - facial strength symmetric 8th - hearing intact 9th - palate elevates symmetrically, uvula midline 11th - shoulder shrug symmetric 12th - tongue protrusion midline  MOTOR:  normal bulk and tone, full strength in the RUE, RLE, plegic on the left   SENSORY:  Decrease sensation on the left and there is extinction to double stimulation in the LLE.  GAIT/STATION:  Deferred    DIAGNOSTIC DATA (LABS, IMAGING, TESTING) - I reviewed patient records, labs, notes, testing and imaging myself where available.  Lab Results  Component Value Date   WBC 5.0 06/04/2024   HGB 14.1 06/04/2024   HCT 41.8 06/04/2024   MCV 85.7 06/04/2024   PLT 150 06/04/2024      Component Value Date/Time   NA 137 06/04/2024 0455   NA 140 08/18/2020 1013   K 3.6 06/04/2024 0455   CL 106 06/04/2024 0455   CO2 24 06/04/2024 0455   GLUCOSE 71 06/04/2024 0455   BUN 17 06/04/2024 0455   BUN 12 08/18/2020 1013   CREATININE 0.73 06/04/2024 0455   CREATININE 0.72 09/26/2016 1130   CALCIUM 10.8 (H) 06/04/2024 0455   CALCIUM 11.4 (H) 02/26/2022 1218   PROT 7.5 06/02/2024 0650   PROT 8.1 06/29/2020 1015   ALBUMIN  3.6 06/02/2024 0650   ALBUMIN  4.6 06/29/2020 1015   AST 27 06/02/2024 0650   ALT 19 06/02/2024 0650   ALKPHOS 73 06/02/2024 0650   BILITOT 0.9 06/02/2024 0650   BILITOT 0.3 06/29/2020 1015   GFRNONAA >60 06/04/2024 0455   GFRNONAA >89 09/26/2016 1130   GFRAA 119 08/18/2020 1013   GFRAA >89 09/26/2016 1130   Lab Results  Component Value Date   CHOL 146 02/06/2022   HDL 46 02/06/2022   LDLCALC 64 02/06/2022   TRIG 297 (H) 02/12/2022   Lab Results   Component Value Date   HGBA1C 5.6 02/06/2022   Lab Results  Component Value Date   VITAMINB12 208 02/10/2024   Lab Results  Component Value Date   TSH 0.896 02/09/2024    Head CT 06/02/2024 1. Right forehead scalp soft tissue swelling. No skull fracture identified. 2. No acute intracranial abnormality. Chronic encephalomalacia in the left occipital lobe, dorsal right thalamus. 3. Chronic calcified cribriform plate encephaloceles.   EEG 06/03/2024 - Seizure without clinical signs, left temporal region - Intermittent slow, left temporal region    ASSESSMENT AND PLAN  53 y.o. year old female  with   Assessment and Plan    Focal epilepsy Ongoing seizure activity without convulsions, currently managed with levetiracetam  (Keppra ) 1000 mg twice daily. Reports fatigue post-medication, no other significant side effects. Increased dosage during last admission in June to manage seizures. Emphasized medication adherence to reduce seizure frequency and explained the chronic nature of the condition. - Continue levetiracetam  (Keppra ) 1000 mg twice daily - Order EEG to monitor brain wave activity post-medication increase - Schedule EEG for two hours duration        1. Partial idiopathic epilepsy with seizures of localized onset, not intractable, without status epilepticus (HCC)   2. 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 Left hemiplegia [facility])     Patient Instructions  Continue with levetiracetam  1000 mg twice daily Continue your other medications Continue visit with physical activity Will obtain a 2-hour EEG, patient was noted to have nonconvulsive seizures during recent hospitalization Follow-up in 6 to 8  months or sooner if worse.   Per [place]  DMV statutes, patients with seizures are not allowed to drive until they have been seizure-free for six months.  Other recommendations include using caution when using heavy equipment or power tools. Avoid  working on ladders or at heights. Take showers instead of baths.  Do not swim alone.  Ensure the water  temperature is not too high on the home water  heater. Do not go swimming alone. Do not lock yourself in a room alone (i.e. bathroom). When caring for infants or small children, sit down when holding, feeding, or changing them to minimize risk of injury to the child in the event you have a seizure. Maintain good sleep hygiene. Avoid alcohol.  Also recommend adequate sleep, hydration, good diet and minimize stress.   During the Seizure  - First, ensure adequate ventilation and place patients on the floor on their left side  Loosen clothing around the neck and ensure the airway is patent. If the patient is clenching the teeth, do not force the mouth open with any object as this can cause severe damage - Remove all items from the surrounding that can be hazardous. The patient may be oblivious to what's happening and may not even know what he or she is doing. If the patient is confused and wandering, either gently guide him/her away and block access to outside areas - Reassure the individual and be comforting - Call 911. In most cases, the seizure ends before EMS arrives. However, there are cases when seizures may last over 3 to 5 minutes. Or the individual may have developed breathing difficulties or severe injuries. If a pregnant patient or a person with diabetes develops a seizure, it is prudent to call an ambulance. - Finally, if the patient does not regain full consciousness, then call EMS. Most patients will remain confused for about 45 to 90 minutes after a seizure, so you must use judgment in calling for help. - Avoid restraints but make sure the patient is in a bed with padded side rails - Place the individual in a lateral position with the neck slightly flexed; this will help the saliva drain from the mouth and prevent the tongue from falling backward - Remove all nearby furniture and other  hazards from the area - Provide verbal assurance as the individual is regaining consciousness - Provide the patient with privacy if possible - Call for help and start treatment as ordered by the caregiver   After the Seizure (Postictal Stage)  After a seizure, most patients experience confusion, fatigue, muscle pain and/or a headache. Thus, one should permit the individual to sleep. For the next few days, reassurance is essential. Being calm and helping reorient the person is also of importance.  Most seizures are painless and end spontaneously. Seizures are not harmful to others but can lead to complications such as stress on the lungs, brain and the heart. Individuals with prior lung problems may develop labored breathing and respiratory distress.    Discussed Patients with epilepsy have a small risk of sudden unexpected death, a condition referred to as sudden unexpected death in epilepsy (SUDEP). SUDEP is defined specifically as the sudden, unexpected, witnessed or unwitnessed, nontraumatic and nondrowning death in patients with epilepsy with or without evidence for a seizure, and excluding documented status epilepticus, in which post mortem examination does not reveal a structural or toxicologic cause for death     Orders Placed This Encounter  Procedures   Adult EEG prolonged 61-120  minutes    No orders of the defined types were placed in this encounter.   Return in about 6 months (around 12/28/2024).   I have spent a total of 65 minutes dedicated to this patient today, preparing to see patient, performing a medically appropriate examination and evaluation, ordering tests and/or medications and procedures, and counseling and educating the patient/family/caregiver; independently interpreting result and communicating results to the family/patient/caregiver; and documenting clinical information in the electronic medical record.   Pastor Falling, MD 06/28/2024, 1:59 PM  [facility] [address] [place], [place] [address] [phone]

## 2024-06-28 NOTE — Patient Instructions (Signed)
 Continue with levetiracetam  1000 mg twice daily Continue your other medications Continue visit with physical activity Will obtain a 2-hour EEG, patient was noted to have nonconvulsive seizures during recent hospitalization Follow-up in 6 to 8 months or sooner if worse.

## 2024-07-02 ENCOUNTER — Ambulatory Visit: Payer: Self-pay | Admitting: Neurology

## 2024-07-02 ENCOUNTER — Ambulatory Visit (INDEPENDENT_AMBULATORY_CARE_PROVIDER_SITE_OTHER): Admitting: *Deleted

## 2024-07-02 DIAGNOSIS — G40009 Localization-related (focal) (partial) idiopathic epilepsy and epileptic syndromes with seizures of localized onset, not intractable, without status epilepticus: Secondary | ICD-10-CM

## 2024-07-02 NOTE — Procedures (Signed)
    History:  53 year old woman with stroke and seizures   EEG classification: Awake and drowsy  Duration: 102 minutes   Technical aspects: This EEG study was done with scalp electrodes positioned according to the 10-20 International system of electrode placement. Electrical activity was reviewed with band pass filter of 1-70Hz , sensitivity of 7 uV/mm, display speed of 56mm/sec with a 60Hz  notched filter applied as appropriate. EEG data were recorded continuously and digitally stored.   Description of the recording: The background rhythms of this recording consists of a fairly well modulated medium theta rhythm. Photic stimulation was performed, did not show any abnormalities. Hyperventilation was also performed, did not show any abnormalities. Drowsiness was manifested by background fragmentation. No abnormal epileptiform discharges seen during this recording. There was mild diffuse slowing. There were no electrographic seizure identified.   Abnormality: Mild diffuse slowing   Impression: This is an abnormal awake and drowsy EEG due to presence of mild diffuse slowing. This is consistent with a generalized brain dysfunction, nonspecific etiology.    Dawana Asper, MD [facility]

## 2024-07-08 NOTE — Telephone Encounter (Signed)
 Marie from [facility] called to request Pt EEG results be faxed to [facility]   Phone# [phone] Fax# [phone]

## 2024-08-26 ENCOUNTER — Encounter: Payer: Medicaid Other | Attending: Psychology | Admitting: Psychology

## 2024-09-09 ENCOUNTER — Emergency Department (HOSPITAL_COMMUNITY)
Admission: EM | Admit: 2024-09-09 | Discharge: 2024-09-10 | Disposition: A | Discharge status: Skilled Nursing Facility | Attending: Emergency Medicine | Admitting: Emergency Medicine

## 2024-09-09 ENCOUNTER — Emergency Department (HOSPITAL_COMMUNITY)

## 2024-09-09 DIAGNOSIS — Z8673 Personal history of transient ischemic attack (TIA), and cerebral infarction without residual deficits: Secondary | ICD-10-CM | POA: Insufficient documentation

## 2024-09-09 DIAGNOSIS — W06XXXA Fall from bed, initial encounter: Secondary | ICD-10-CM | POA: Diagnosis not present

## 2024-09-09 DIAGNOSIS — Z7901 Long term (current) use of anticoagulants: Secondary | ICD-10-CM | POA: Diagnosis not present

## 2024-09-09 DIAGNOSIS — R809 Proteinuria, unspecified: Secondary | ICD-10-CM | POA: Insufficient documentation

## 2024-09-09 DIAGNOSIS — S0511XA Contusion of eyeball and orbital tissues, right eye, initial encounter: Secondary | ICD-10-CM | POA: Diagnosis not present

## 2024-09-09 DIAGNOSIS — I10 Essential (primary) hypertension: Secondary | ICD-10-CM | POA: Insufficient documentation

## 2024-09-09 DIAGNOSIS — Z79899 Other long term (current) drug therapy: Secondary | ICD-10-CM | POA: Insufficient documentation

## 2024-09-09 DIAGNOSIS — W19XXXA Unspecified fall, initial encounter: Secondary | ICD-10-CM

## 2024-09-09 DIAGNOSIS — S0990XA Unspecified injury of head, initial encounter: Secondary | ICD-10-CM | POA: Diagnosis present

## 2024-09-09 NOTE — ED Triage Notes (Signed)
 Pt BIB EMS from [facility] facility. EMS reports unwitnessed fall from bed, staff reported pt was put in bed 30 mins prior to being found. Hematoma to R forehead, unequal pupils. takes eliquis , did not have evening dose. Deny neck or back pain.  Pt is bed bound with L side deficits baseline, A&Ox1 baseline. EDP at bedside.   Cbg 116, 98% RA, HR 100% RA

## 2024-09-09 NOTE — ED Provider Notes (Signed)
 [place] EMERGENCY DEPARTMENT AT [facility] Provider Note   CSN: [id number] Arrival date & time: 09/09/24  2309     Patient presents with: Sarah Myers is a 53 y.o. female.  {Add pertinent medical, surgical, social history, OB history to HPI:32947} The patient is a 53 year old female from [facility] who presents following an unwitnessed fall from her bed approximately 30 minutes prior to EMS arrival. She has a hematoma on the right side of her forehead and unequal pupils. The patient is confused at baseline, with intermittent orientation, and has a Glasgow Coma Scale (GCS) score of 13. 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 She is currently on Eliquis  5 mg twice daily, although she has not received her nighttime dose and has reportedly not been taking her medications as prescribed. The patient is bed-bound and denies any back or neck pain but reports head pain. She is unable to provide her name or location but can answer yes or no questions regarding pain. History was obtained from EMS and nursing home staff as patient is basically non verbal at this point.    Fall       Prior to Admission medications   Medication Sig Start Date End Date Taking? Authorizing Provider  acetaminophen  (TYLENOL ) 500 MG tablet Take 1,000 mg by mouth every 6 (six) hours as needed (pain).    [provider]  amantadine  (SYMMETREL ) 100 MG capsule Take 100 mg by mouth 2 (two) times daily.    [provider]  amLODipine  (NORVASC ) 10 MG tablet Take 1 tablet (10 mg total) by mouth daily. 06/22/22   Raulkar, Sven SQUIBB, MD  apixaban  (ELIQUIS ) 5 MG TABS tablet Take 1 tablet (5 mg total) by mouth 2 (two) times daily. 06/22/22   Raulkar, Sven SQUIBB, MD  Baclofen  15 MG TABS Take 15 mg by mouth 3 (three) times  daily.    [provider]  bisacodyl  (DULCOLAX) 10 MG suppository Place 1 suppository (10 mg total) rectally daily as needed for moderate constipation or mild constipation. 04/17/24   Hongalgi, Anand D, MD  busPIRone  (BUSPAR ) 7.5 MG tablet Take 7.5 mg by mouth 2 (two) times daily.    [provider]  cholecalciferol  (VITAMIN D3) 25 MCG (1000 UNIT) tablet Take 1,000 Units by mouth daily.    [provider]  famotidine  (PEPCID ) 20 MG tablet Take 1 tablet (20 mg total) by mouth 2 (two) times daily. 05/22/22   Raulkar, Sven SQUIBB, MD  gabapentin  (NEURONTIN ) 100 MG capsule Take 1 capsule (100 mg total) by mouth 2 (two) times daily. 05/24/22   Raulkar, Sven SQUIBB, MD  hydrALAZINE  (APRESOLINE ) 25 MG tablet Take 25 mg by mouth every 6 (six) hours. 10/10/22   [provider]  levETIRAcetam  (KEPPRA ) 1000 MG tablet Take 1 tablet (1,000 mg total) by mouth 2 (two) times daily. 06/04/24   Kandis Perkins, DO  losartan  (COZAAR ) 25 MG tablet Take 1 tablet (25 mg total) by mouth daily. 06/09/22   Raulkar, Sven SQUIBB, MD  magnesium  gluconate (MAGONATE) 500 MG tablet Take 1 tablet (500 mg total) by mouth at bedtime. 05/22/22   Raulkar, Krutika P, MD  melatonin 3 MG TABS tablet Take 3 mg by mouth at bedtime as needed (sleep).    [provider]  mirtazapine  (REMERON ) 7.5 MG tablet Take 7.5 mg by mouth at bedtime.  [provider]  Multiple Vitamin (MULTIVITAMIN WITH MINERALS) TABS tablet Take 1 tablet by mouth daily. 05/22/22   Raulkar, Sven SQUIBB, MD  Nutritional Supplements (NUTRITIONAL DRINK) LIQD Take 240 mLs by mouth 2 (two) times daily. House supplement    [provider]  ondansetron  (ZOFRAN ) 4 MG tablet Take 1 tablet (4 mg total) by mouth every 8 (eight) hours as needed for nausea or vomiting. 10/17/22   Tegeler, Lonni PARAS, MD  polyethylene glycol (MIRALAX  / GLYCOLAX ) 17 g packet Take 17 g by mouth daily. 10/17/23   Drusilla Sabas RAMAN, MD  saccharomyces boulardii  (FLORASTOR) 250 MG capsule Take 250 mg by mouth daily.    [provider]  senna-docusate (SENOKOT-S) 8.6-50 MG tablet Take 1 tablet by mouth 2 (two) times daily. 04/17/24   Hongalgi, Anand D, MD  tiZANidine  (ZANAFLEX ) 2 MG tablet Take 2 mg by mouth at bedtime.    [provider]    Allergies: Zestril  [lisinopril ]    Review of Systems  Updated Vital Signs There were no vitals taken for this visit.  Physical Exam Vitals and nursing note reviewed.  Constitutional:      Appearance: She is well-developed.  HENT:     Head: Normocephalic.     Comments: Hematoma and small abrasion above right eye Eyes:     Comments: R>L  Cardiovascular:     Rate and Rhythm: Normal rate and regular rhythm.  Pulmonary:     Effort: No respiratory distress.     Breath sounds: No stridor.  Abdominal:     General: There is no distension.  Musculoskeletal:     Cervical back: Normal range of motion.  Skin:    General: Skin is warm and dry.  Neurological:     General: No focal deficit present.     Mental Status: She is alert.     Comments: Contractures, doesn't follow commands to perform proper neuro exam     (all labs ordered are listed, but only abnormal results are displayed) Labs Reviewed  CBC WITH DIFFERENTIAL/PLATELET  COMPREHENSIVE METABOLIC PANEL WITH GFR  URINALYSIS, W/ REFLEX TO CULTURE (INFECTION SUSPECTED)  CBG MONITORING, ED    EKG: None  Radiology: No results found.  {Document cardiac monitor, telemetry assessment procedure when appropriate:32947} Procedures   Medications Ordered in the ED - No data to display    {Click here for ABCD2, HEART and other calculators REFRESH Note before signing:1}                              Medical Decision Making Amount and/or Complexity of Data Reviewed Labs: ordered. Radiology: ordered. ECG/medicine tests: ordered.  Will work up for trauma and unwitnessed fall but overall patient appears well with low likelihood of  injuries. Is on eliquis  with obvious head trauma and not able to verbalize symptoms so will get head ct as well.  ***  {Document critical care time when appropriate  Document review of labs and clinical decision tools ie CHADS2VASC2, etc  Document your independent review of radiology images and any outside records  Document your discussion with family members, caretakers and with consultants  Document social determinants of health affecting pt's care  Document your decision making why or why not admission, treatments were needed:32947:::1}   Final diagnoses:  None    ED Discharge Orders     None

## 2024-09-10 ENCOUNTER — Other Ambulatory Visit: Payer: Self-pay

## 2024-09-10 DIAGNOSIS — S0511XA Contusion of eyeball and orbital tissues, right eye, initial encounter: Secondary | ICD-10-CM | POA: Diagnosis not present

## 2024-09-10 LAB — CBC WITH DIFFERENTIAL/PLATELET
Abs Immature Granulocytes: 0.01 K/uL (ref 0.00–0.07)
Basophils Absolute: 0 K/uL (ref 0.0–0.1)
Basophils Relative: 1 %
Eosinophils Absolute: 0 K/uL (ref 0.0–0.5)
Eosinophils Relative: 0 %
HCT: 45.3 % (ref 36.0–46.0)
Hemoglobin: 14.9 g/dL (ref 12.0–15.0)
Immature Granulocytes: 0 %
Lymphocytes Relative: 25 %
Lymphs Abs: 1.8 K/uL (ref 0.7–4.0)
MCH: 28.1 pg (ref 26.0–34.0)
MCHC: 32.9 g/dL (ref 30.0–36.0)
MCV: 85.5 fL (ref 80.0–100.0)
Monocytes Absolute: 0.4 K/uL (ref 0.1–1.0)
Monocytes Relative: 6 %
Neutro Abs: 5 K/uL (ref 1.7–7.7)
Neutrophils Relative %: 68 %
Platelets: 167 K/uL (ref 150–400)
RBC: 5.3 MIL/uL — ABNORMAL HIGH (ref 3.87–5.11)
RDW: 12 % (ref 11.5–15.5)
WBC: 7.3 K/uL (ref 4.0–10.5)
nRBC: 0 % (ref 0.0–0.2)

## 2024-09-10 LAB — COMPREHENSIVE METABOLIC PANEL WITH GFR
ALT: 17 U/L (ref 0–44)
AST: 21 U/L (ref 15–41)
Albumin: 4.1 g/dL (ref 3.5–5.0)
Alkaline Phosphatase: 91 U/L (ref 38–126)
Anion gap: 13 (ref 5–15)
BUN: 24 mg/dL — ABNORMAL HIGH (ref 6–20)
CO2: 21 mmol/L — ABNORMAL LOW (ref 22–32)
Calcium: 11.6 mg/dL — ABNORMAL HIGH (ref 8.9–10.3)
Chloride: 109 mmol/L (ref 98–111)
Creatinine, Ser: 0.84 mg/dL (ref 0.44–1.00)
GFR, Estimated: >60 mL/min (ref >60)
Glucose, Bld: 102 mg/dL — ABNORMAL HIGH (ref 70–99)
Potassium: 3.9 mmol/L (ref 3.5–5.1)
Sodium: 143 mmol/L (ref 135–145)
Total Bilirubin: 1.2 mg/dL (ref 0.0–1.2)
Total Protein: 8.1 g/dL (ref 6.5–8.1)

## 2024-09-10 LAB — URINALYSIS, W/ REFLEX TO CULTURE (INFECTION SUSPECTED)
Bacteria, UA: NONE SEEN
Bilirubin Urine: NEGATIVE
Glucose, UA: NEGATIVE mg/dL
Hgb urine dipstick: NEGATIVE
Ketones, ur: 20 mg/dL — AB
Leukocytes,Ua: NEGATIVE
Nitrite: NEGATIVE
Protein, ur: 30 mg/dL — AB
Specific Gravity, Urine: 1.019 (ref 1.005–1.030)
pH: 5 (ref 5.0–8.0)

## 2024-09-10 NOTE — ED Notes (Signed)
 RN attempted to call [facility] and rehab, first call no answer, second this RN was transferred by [employer] operational officer which no answer but left voice message advising call back. This RN attempted to call third time and was transferred again with no answer.

## 2024-09-10 NOTE — ED Notes (Signed)
 [facility] called for transport to [facility]

## 2024-09-10 NOTE — ED Notes (Signed)
Pt placed on bedpan to attempt to obtain urine sample.

## 2024-09-10 NOTE — ED Notes (Signed)
 This RN spoke with pt daughter Alfonso to inform her that pt has been seen and is ready for dc at this time, daughter informed that no ETA for transport is available at this time.

## 2024-09-14 ENCOUNTER — Encounter (HOSPITAL_COMMUNITY): Payer: Self-pay | Admitting: Emergency Medicine

## 2024-09-14 ENCOUNTER — Emergency Department (HOSPITAL_COMMUNITY)
Admission: EM | Admit: 2024-09-14 | Discharge: 2024-09-14 | Disposition: A | Discharge status: Home / Self Care | Source: Skilled Nursing Facility | Attending: Emergency Medicine | Admitting: Emergency Medicine

## 2024-09-14 ENCOUNTER — Emergency Department (HOSPITAL_COMMUNITY)

## 2024-09-14 ENCOUNTER — Other Ambulatory Visit: Payer: Self-pay

## 2024-09-14 DIAGNOSIS — R569 Unspecified convulsions: Secondary | ICD-10-CM | POA: Diagnosis present

## 2024-09-14 DIAGNOSIS — E876 Hypokalemia: Secondary | ICD-10-CM | POA: Insufficient documentation

## 2024-09-14 DIAGNOSIS — I10 Essential (primary) hypertension: Secondary | ICD-10-CM | POA: Diagnosis not present

## 2024-09-14 DIAGNOSIS — Z7901 Long term (current) use of anticoagulants: Secondary | ICD-10-CM | POA: Diagnosis not present

## 2024-09-14 DIAGNOSIS — Z79899 Other long term (current) drug therapy: Secondary | ICD-10-CM | POA: Insufficient documentation

## 2024-09-14 LAB — URINALYSIS, ROUTINE W REFLEX MICROSCOPIC
Bilirubin Urine: NEGATIVE
Glucose, UA: NEGATIVE mg/dL
Hgb urine dipstick: NEGATIVE
Ketones, ur: 20 mg/dL — AB
Leukocytes,Ua: NEGATIVE
Nitrite: NEGATIVE
Protein, ur: NEGATIVE mg/dL
Specific Gravity, Urine: 1.014 (ref 1.005–1.030)
pH: 5 (ref 5.0–8.0)

## 2024-09-14 LAB — BASIC METABOLIC PANEL WITH GFR
Anion gap: 13 (ref 5–15)
BUN: 9 mg/dL (ref 6–20)
CO2: 21 mmol/L — ABNORMAL LOW (ref 22–32)
Calcium: 11.4 mg/dL — ABNORMAL HIGH (ref 8.9–10.3)
Chloride: 105 mmol/L (ref 98–111)
Creatinine, Ser: 0.62 mg/dL (ref 0.44–1.00)
GFR, Estimated: >60 mL/min (ref >60)
Glucose, Bld: 93 mg/dL (ref 70–99)
Potassium: 3.4 mmol/L — ABNORMAL LOW (ref 3.5–5.1)
Sodium: 139 mmol/L (ref 135–145)

## 2024-09-14 LAB — CBC
HCT: 44.4 % (ref 36.0–46.0)
Hemoglobin: 14.4 g/dL (ref 12.0–15.0)
MCH: 27.5 pg (ref 26.0–34.0)
MCHC: 32.4 g/dL (ref 30.0–36.0)
MCV: 84.9 fL (ref 80.0–100.0)
Platelets: 194 K/uL (ref 150–400)
RBC: 5.23 MIL/uL — ABNORMAL HIGH (ref 3.87–5.11)
RDW: 12.2 % (ref 11.5–15.5)
WBC: 4.2 K/uL (ref 4.0–10.5)
nRBC: 0 % (ref 0.0–0.2)

## 2024-09-14 LAB — I-STAT CG4 LACTIC ACID, ED: Lactic Acid, Venous: 0.8 mmol/L (ref 0.5–1.9)

## 2024-09-14 MED ORDER — LEVETIRACETAM (KEPPRA) 500 MG/5 ML ADULT IV PUSH
1000.0000 mg | Freq: Once | INTRAVENOUS | Status: AC
  Administered 2024-09-14: 1000 mg via INTRAVENOUS
  Filled 2024-09-14: qty 10

## 2024-09-14 MED ORDER — HALOPERIDOL LACTATE 5 MG/ML IJ SOLN
5.0000 mg | Freq: Once | INTRAMUSCULAR | Status: AC
  Administered 2024-09-14: 5 mg via INTRAMUSCULAR
  Filled 2024-09-14: qty 1

## 2024-09-14 NOTE — Discharge Instructions (Signed)
 Thank you for letting us  evaluate you today.  Your lab work was per your baseline.  There is no bleeding in your brain.  No UTI.  We have given you your morning dose of Keppra  via IV here Emergency Department.  Please take night dose when you get home.  Please follow-up with neurology for further management of seizures  Return to emergency department from if you experience altered mentation, seizures

## 2024-09-14 NOTE — ED Triage Notes (Signed)
 Pt bib EMS from [facility] for absent seizures. 0830 not responding as usual 0930 visible presence of seizure activity per staff. On thinners fell 5 days ago but cleared by [facility]. AxOx2  CBG 111 98 HR

## 2024-09-14 NOTE — ED Provider Notes (Signed)
 Received patient in signout from previous provider pending completion of ED workup, CT imaging, reassessment.  See his note  In short, patient presents Emergency Department from [facility] facility for evaluation of seizure that lasted approximately an hour that started at 0830 this morning.  Was provided Ativan  by facility  Was initially provided Haldol  in ED by previous EDP as patient was completely uncooperative with ED workup.   Physical Exam  BP 117/83   Pulse 81   Temp 99.1 F (37.3 C) (Axillary)   Resp 19   Ht 5' 3 (1.6 m)   Wt 42.2 kg   SpO2 99%   BMI 16.47 kg/m    ED Course / MDM    Medical Decision Making Amount and/or Complexity of Data Reviewed Labs: ordered. Radiology: ordered.  Risk Prescription drug management.   ED workup negative for UTI.  No signs of sepsis with no tachycardia nor fever.  No lactic acidosis.  CT head without acute ICH.  Chest x-ray without fluid nor pneumonia.  Daughter at bedside reports that patient is at baseline  Spoke to Dr. Michaela with neurology who recommended providing IV Keppra  as patient has not had home medication and outpatient follow up with neurology as long as patient remains at baseline with no new seizure-like activity nor acute emergent pathology requiring medical admission  Provided Keppra  1000 mg IV as this is her home dose and she missed this morning's dose for loading dose  As patient is at baseline with no acute electrolyte abnormalities, signs of sepsis, UTI, pneumonia, no ICH, will discharge patient with outpatient neurology follow-up for further management  Discussed ED workup, disposition, return to ED precautions with patient's daughter who expresses understanding agrees with plan.  All questions answered to their satisfaction.  They are agreeable to plan.  Discharge instructions provided on paperwork    Minnie Tinnie BRAVO, PA 09/16/24 1534    Patt Alm Macho, MD 09/16/24 [phone]

## 2024-09-14 NOTE — ED Notes (Signed)
 PTAR called for transport back to facility

## 2024-09-14 NOTE — ED Provider Notes (Signed)
 [place] EMERGENCY DEPARTMENT AT [facility] Provider Note   CSN: [id number] Arrival date & time: 09/14/24  1230     Patient presents with: Seizures   Mahasin T Vezina is a 53 y.o. female with past medical history of hypertension, anxiety, acute hemorrhage of right basal ganglia, left-sided paralysis, seizures who presents with concern for seizure at [facility] facility for approximately 1 hour from around 830-930.  She is on a blood thinner and had a fall 5 days ago but was seen and assessed at [place], no evidence of intracranial hemorrhage at that time.  Facility not reporting any additional falls.  Received Ativan  by facility, and no longer seizing with EMS, patient is quite upset.    Seizures      Prior to Admission medications   Medication Sig Start Date End Date Taking? Authorizing Provider  acetaminophen  (TYLENOL ) 500 MG tablet Take 1,000 mg by mouth every 6 (six) hours as needed (pain).    [provider]  amantadine  (SYMMETREL ) 100 MG capsule Take 100 mg by mouth 2 (two) times daily.    [provider]  amLODipine  (NORVASC ) 10 MG tablet Take 1 tablet (10 mg total) by mouth daily. 06/22/22   Raulkar, Sven SQUIBB, MD  apixaban  (ELIQUIS ) 5 MG TABS tablet Take 1 tablet (5 mg total) by mouth 2 (two) times daily. 06/22/22   Raulkar, Sven SQUIBB, MD  Baclofen  15 MG TABS Take 15 mg by mouth 3 (three) times daily.    [provider]  bisacodyl  (DULCOLAX) 10 MG suppository Place 1 suppository (10 mg total) rectally daily as needed for moderate constipation or mild constipation. 04/17/24   Hongalgi, Anand D, MD  busPIRone  (BUSPAR ) 7.5 MG tablet Take 7.5 mg by mouth 2 (two) times daily.    [provider]  cholecalciferol  (VITAMIN D3) 25 MCG (1000 UNIT) tablet Take 1,000 Units by mouth daily.    [provider]  famotidine  (PEPCID ) 20 MG tablet Take 1 tablet (20 mg total) by mouth 2 (two) times daily. 05/22/22   Raulkar, Sven SQUIBB,  MD  gabapentin  (NEURONTIN ) 100 MG capsule Take 1 capsule (100 mg total) by mouth 2 (two) times daily. 05/24/22   Raulkar, Sven SQUIBB, MD  hydrALAZINE  (APRESOLINE ) 25 MG tablet Take 25 mg by mouth every 6 (six) hours. 10/10/22   [provider]  levETIRAcetam  (KEPPRA ) 1000 MG tablet Take 1 tablet (1,000 mg total) by mouth 2 (two) times daily. 06/04/24   Kandis Perkins, DO  losartan  (COZAAR ) 25 MG tablet Take 1 tablet (25 mg total) by mouth daily. 06/09/22   Raulkar, Sven SQUIBB, MD  magnesium  gluconate (MAGONATE) 500 MG tablet Take 1 tablet (500 mg total) by mouth at bedtime. 05/22/22   Raulkar, Krutika P, MD  melatonin 3 MG TABS tablet Take 3 mg by mouth at bedtime as needed (sleep).    [provider]  mirtazapine  (REMERON ) 7.5 MG tablet Take 7.5 mg by mouth at bedtime.    [provider]  Multiple Vitamin (MULTIVITAMIN WITH MINERALS) TABS tablet Take 1 tablet by mouth daily. 05/22/22   Raulkar, Sven SQUIBB, MD  Nutritional Supplements (NUTRITIONAL DRINK) LIQD Take 240 mLs by mouth 2 (two) times daily. House supplement    [provider]  ondansetron  (ZOFRAN ) 4 MG tablet Take 1 tablet (4 mg total) by mouth every 8 (eight) hours as needed for nausea or vomiting. 10/17/22   Tegeler, Lonni PARAS, MD  polyethylene glycol (MIRALAX  / GLYCOLAX ) 17 g packet Take  17 g by mouth daily. 10/17/23   Drusilla Sabas RAMAN, MD  saccharomyces boulardii (FLORASTOR) 250 MG capsule Take 250 mg by mouth daily.    [provider]  senna-docusate (SENOKOT-S) 8.6-50 MG tablet Take 1 tablet by mouth 2 (two) times daily. 04/17/24   Hongalgi, Anand D, MD  tiZANidine  (ZANAFLEX ) 2 MG tablet Take 2 mg by mouth at bedtime.    [provider]    Allergies: Zestril  [lisinopril ]    Review of Systems  Neurological:  Positive for seizures.  All other systems reviewed and are negative.   Updated Vital Signs BP 117/83   Pulse 81   Temp 99.1 F (37.3 C) (Axillary)   Resp 19   Ht 5' 3 (1.6 m)    Wt 42.2 kg   SpO2 99%   BMI 16.47 kg/m   Physical Exam Vitals and nursing note reviewed.  Constitutional:      General: She is not in acute distress.    Appearance: Normal appearance.  HENT:     Head: Normocephalic and atraumatic.  Eyes:     General:        Right eye: No discharge.        Left eye: No discharge.  Cardiovascular:     Rate and Rhythm: Normal rate and regular rhythm.     Heart sounds: No murmur heard.    No friction rub. No gallop.  Pulmonary:     Effort: Pulmonary effort is normal.     Breath sounds: Normal breath sounds.  Abdominal:     General: Bowel sounds are normal.     Palpations: Abdomen is soft.  Skin:    General: Skin is warm and dry.     Capillary Refill: Capillary refill takes less than 2 seconds.  Neurological:     Mental Status: She is alert. Mental status is at baseline.  Psychiatric:        Mood and Affect: Mood normal.        Behavior: Behavior normal.     Comments: Irritated, agitated, uncooperative     (all labs ordered are listed, but only abnormal results are displayed) Labs Reviewed  CBC - Abnormal; Notable for the following components:      Result Value   RBC 5.23 (*)    All other components within normal limits  BASIC METABOLIC PANEL WITH GFR - Abnormal; Notable for the following components:   Potassium 3.4 (*)    CO2 21 (*)    Calcium 11.4 (*)    All other components within normal limits  URINALYSIS, ROUTINE W REFLEX MICROSCOPIC  I-STAT CG4 LACTIC ACID, ED  I-STAT CG4 LACTIC ACID, ED    EKG: None  Radiology: DG Chest Portable 1 View Result Date: 09/14/2024 CLINICAL DATA:  Altered mental status. EXAM: PORTABLE CHEST 1 VIEW COMPARISON:  09/09/2024. FINDINGS: The heart size and mediastinal contours are within normal limits. No consolidation, effusion, or pneumothorax is seen. No acute osseous abnormality. IMPRESSION: No active disease. Electronically Signed   By: Leita Birmingham M.D.   On: 09/14/2024 15:00      Procedures   Medications Ordered in the ED  haloperidol  lactate (HALDOL ) injection 5 mg (5 mg Intramuscular Given 09/14/24 1310)                                    Medical Decision Making Amount and/or Complexity of Data Reviewed Labs: ordered. Radiology: ordered.  Risk Prescription drug management.   This patient is a 53 y.o. female  who presents to the ED for concern of seizures, AMS.   Differential diagnoses prior to evaluation: The emergent differential diagnosis includes, but is not limited to,  CVA, seizure, hypotension, sepsis, hypoglycemia, hypoxic encephalopathy, metabolic encephalopathy, polypharmacy, substance abuse, developing dementia or alzheimers, meningitis, encephalitis, hypertensive emergency, other systemic infection, acute alcohol intoxication, acute alcohol or other drug withdrawal or psychiatric manifestation vs other . This is not an exhaustive differential.   Past Medical History / Co-morbidities / Social History: hypertension, anxiety, acute hemorrhage of right basal ganglia, left-sided paralysis, seizures  Additional history: Chart reviewed. Pertinent results include: Reviewed lab work, imaging, previous ED evaluations, hospital admissions.  Physical Exam: Physical exam performed. The pertinent findings include: No signs of trauma, vital signs stable other than mildly elevated temp at 99.1.  Some hypertension, blood pressure 143/107.  She has not neurologic baseline per family other than being somewhat irritated, agitated.  She is not actively seizing at this time.  Lab Tests/Imaging studies: I personally interpreted labs/imaging and the pertinent results include: CBC unremarkable, lactic acid unremarkable, CT head, plain, chest x-ray pending at time of handoff.  BMP with mild hypokalemia, Tessman 3.4, hypercalcemia, calcium 11.4.  Cardiac monitoring: EKG obtained and interpreted by myself and attending physician which shows: Normal sinus rhythm,  no acute ST-T changes   Medications: I ordered medication including Haldol  for agitation.  I have reviewed the patients home medicines and have made adjustments as needed.   3:14 PM Care of MAURYA NETHERY transferred to [facility] Dr. Charlyn at the end of my shift as the patient will require reassessment once labs/imaging have resulted. Patient presentation, ED course, and plan of care discussed with review of all pertinent labs and imaging. Please see his/her note for further details regarding further ED course and disposition. Plan at time of handoff is patient likely stable for discharge after cleared from medical perspective as long as returning to baseline which is left-sided weakness, alert and oriented x 2. This may be altered or completely changed at the discretion of the oncoming team pending results of further workup.   Final diagnoses:  None    ED Discharge Orders     None          Rosan Sherlean VEAR DEVONNA 09/14/24 1514    Geraldene Hamilton, MD 09/16/24 2223

## 2024-10-09 ENCOUNTER — Other Ambulatory Visit: Payer: Self-pay

## 2024-10-09 ENCOUNTER — Emergency Department (HOSPITAL_COMMUNITY)

## 2024-10-09 ENCOUNTER — Emergency Department (HOSPITAL_COMMUNITY)
Admission: EM | Admit: 2024-10-09 | Discharge: 2024-10-10 | Disposition: A | Discharge status: Home / Self Care | Attending: Student in an Organized Health Care Education/Training Program | Admitting: Student in an Organized Health Care Education/Training Program

## 2024-10-09 DIAGNOSIS — G839 Paralytic syndrome, unspecified: Secondary | ICD-10-CM

## 2024-10-09 DIAGNOSIS — Z79899 Other long term (current) drug therapy: Secondary | ICD-10-CM | POA: Insufficient documentation

## 2024-10-09 DIAGNOSIS — Z7901 Long term (current) use of anticoagulants: Secondary | ICD-10-CM | POA: Insufficient documentation

## 2024-10-09 DIAGNOSIS — Z86711 Personal history of pulmonary embolism: Secondary | ICD-10-CM | POA: Diagnosis not present

## 2024-10-09 DIAGNOSIS — G40909 Epilepsy, unspecified, not intractable, without status epilepticus: Secondary | ICD-10-CM | POA: Insufficient documentation

## 2024-10-09 DIAGNOSIS — M79672 Pain in left foot: Secondary | ICD-10-CM | POA: Diagnosis present

## 2024-10-09 DIAGNOSIS — I10 Essential (primary) hypertension: Secondary | ICD-10-CM | POA: Diagnosis not present

## 2024-10-09 DIAGNOSIS — I69154 Hemiplegia and hemiparesis following nontraumatic intracerebral hemorrhage affecting left non-dominant side: Secondary | ICD-10-CM | POA: Insufficient documentation

## 2024-10-09 DIAGNOSIS — M79605 Pain in left leg: Secondary | ICD-10-CM | POA: Diagnosis not present

## 2024-10-09 DIAGNOSIS — R0789 Other chest pain: Secondary | ICD-10-CM | POA: Insufficient documentation

## 2024-10-09 DIAGNOSIS — R52 Pain, unspecified: Secondary | ICD-10-CM

## 2024-10-09 DIAGNOSIS — R4182 Altered mental status, unspecified: Secondary | ICD-10-CM | POA: Diagnosis present

## 2024-10-09 LAB — CBC WITH DIFFERENTIAL/PLATELET
Abs Immature Granulocytes: 0 K/uL (ref 0.00–0.07)
Basophils Absolute: 0 K/uL (ref 0.0–0.1)
Basophils Relative: 1 %
Eosinophils Absolute: 0.1 K/uL (ref 0.0–0.5)
Eosinophils Relative: 1 %
HCT: 42.2 % (ref 36.0–46.0)
Hemoglobin: 13.7 g/dL (ref 12.0–15.0)
Immature Granulocytes: 0 %
Lymphocytes Relative: 35 %
Lymphs Abs: 1.4 K/uL (ref 0.7–4.0)
MCH: 28.4 pg (ref 26.0–34.0)
MCHC: 32.5 g/dL (ref 30.0–36.0)
MCV: 87.6 fL (ref 80.0–100.0)
Monocytes Absolute: 0.2 K/uL (ref 0.1–1.0)
Monocytes Relative: 5 %
Neutro Abs: 2.4 K/uL (ref 1.7–7.7)
Neutrophils Relative %: 58 %
Platelets: 174 K/uL (ref 150–400)
RBC: 4.82 MIL/uL (ref 3.87–5.11)
RDW: 12.2 % (ref 11.5–15.5)
WBC: 4.1 K/uL (ref 4.0–10.5)
nRBC: 0 % (ref 0.0–0.2)

## 2024-10-09 LAB — URINALYSIS, ROUTINE W REFLEX MICROSCOPIC
Bilirubin Urine: NEGATIVE
Glucose, UA: NEGATIVE mg/dL
Ketones, ur: NEGATIVE mg/dL
Nitrite: NEGATIVE
Protein, ur: NEGATIVE mg/dL
Specific Gravity, Urine: 1.012 (ref 1.005–1.030)
pH: 7 (ref 5.0–8.0)

## 2024-10-09 LAB — COMPREHENSIVE METABOLIC PANEL WITH GFR
ALT: 16 U/L (ref 0–44)
AST: 18 U/L (ref 15–41)
Albumin: 3.8 g/dL (ref 3.5–5.0)
Alkaline Phosphatase: 81 U/L (ref 38–126)
Anion gap: 9 (ref 5–15)
BUN: 13 mg/dL (ref 6–20)
CO2: 24 mmol/L (ref 22–32)
Calcium: 10.6 mg/dL — ABNORMAL HIGH (ref 8.9–10.3)
Chloride: 108 mmol/L (ref 98–111)
Creatinine, Ser: 0.76 mg/dL (ref 0.44–1.00)
GFR, Estimated: >60 mL/min (ref >60)
Glucose, Bld: 90 mg/dL (ref 70–99)
Potassium: 3.9 mmol/L (ref 3.5–5.1)
Sodium: 141 mmol/L (ref 135–145)
Total Bilirubin: 0.4 mg/dL (ref 0.0–1.2)
Total Protein: 7.7 g/dL (ref 6.5–8.1)

## 2024-10-09 LAB — TROPONIN I (HIGH SENSITIVITY)
Troponin I (High Sensitivity): 3 ng/L (ref <18)
Troponin I (High Sensitivity): 3 ng/L

## 2024-10-09 MED ORDER — LORAZEPAM 1 MG PO TABS
0.5000 mg | ORAL_TABLET | Freq: Once | ORAL | Status: AC
  Administered 2024-10-09: 0.5 mg via ORAL

## 2024-10-09 MED ORDER — ONDANSETRON HCL 4 MG/2ML IJ SOLN
4.0000 mg | Freq: Once | INTRAMUSCULAR | Status: AC
  Administered 2024-10-09: 4 mg via INTRAVENOUS
  Filled 2024-10-09: qty 2

## 2024-10-09 MED ORDER — LORAZEPAM 1 MG PO TABS: 1.0000 mg | ORAL_TABLET | Freq: Once | ORAL | Status: DC

## 2024-10-09 MED ORDER — LORAZEPAM 1 MG PO TABS
0.5000 mg | ORAL_TABLET | Freq: Once | ORAL | Status: DC
  Administered 2024-10-09: 0.5 mg via ORAL
  Filled 2024-10-09: qty 1

## 2024-10-09 MED ORDER — SODIUM CHLORIDE 0.9 % IV BOLUS: 500.0000 mL | Freq: Once | INTRAVENOUS | Status: DC

## 2024-10-09 MED ORDER — OXYCODONE-ACETAMINOPHEN 5-325 MG PO TABS
1.0000 | ORAL_TABLET | Freq: Once | ORAL | Status: DC
Start: 2024-10-09 — End: 2024-10-09

## 2024-10-09 MED ORDER — LEVETIRACETAM (KEPPRA) 500 MG/5 ML ADULT IV PUSH
1000.0000 mg | Freq: Once | INTRAVENOUS | Status: AC
  Administered 2024-10-09: 1000 mg via INTRAVENOUS
  Filled 2024-10-09: qty 10

## 2024-10-09 MED ORDER — BACLOFEN 20 MG PO TABS: 20.0000 mg | ORAL_TABLET | Freq: Three times a day (TID) | ORAL | 0 refills | Status: AC

## 2024-10-09 MED ORDER — GABAPENTIN 100 MG PO CAPS
200.0000 mg | ORAL_CAPSULE | ORAL | Status: AC
  Administered 2024-10-09: 200 mg via ORAL
  Filled 2024-10-09: qty 2

## 2024-10-09 MED ORDER — GABAPENTIN 100 MG PO CAPS: 100.0000 mg | ORAL_CAPSULE | Freq: Three times a day (TID) | ORAL | 0 refills | Status: AC

## 2024-10-09 MED ORDER — BACLOFEN 10 MG PO TABS
20.0000 mg | ORAL_TABLET | ORAL | Status: AC
  Administered 2024-10-09: 20 mg via ORAL
  Filled 2024-10-09: qty 2

## 2024-10-09 MED ORDER — LABETALOL HCL 5 MG/ML IV SOLN
10.0000 mg | Freq: Once | INTRAVENOUS | Status: AC
  Administered 2024-10-09: 10 mg via INTRAVENOUS
  Filled 2024-10-09: qty 4

## 2024-10-09 MED ORDER — FENTANYL CITRATE (PF) 50 MCG/ML IJ SOSY
25.0000 ug | PREFILLED_SYRINGE | Freq: Once | INTRAMUSCULAR | Status: DC
Start: 2024-10-09 — End: 2024-10-09
  Filled 2024-10-09: qty 1

## 2024-10-09 MED ORDER — MORPHINE SULFATE (PF) 4 MG/ML IV SOLN: 4.0000 mg | Freq: Once | INTRAVENOUS | Status: DC

## 2024-10-09 MED ORDER — LORAZEPAM 0.5 MG PO TABS: 0.5000 mg | ORAL_TABLET | Freq: Once | ORAL | Status: DC

## 2024-10-09 NOTE — ED Notes (Signed)
 Patients IV infiltrated after administering labetalol  and keppra . Notified pharmacy.

## 2024-10-09 NOTE — ED Triage Notes (Addendum)
 Pt bib [facility] from [facility]. Facility called for CP and gave one nitroglycerin  with no relief. With EMS pt has not complained of any CP with EMS, only left side pain. Pt arrives stating her left foot is throbbing and her chest was hurting but not at the moment

## 2024-10-09 NOTE — Discharge Instructions (Addendum)
 Your workup today was quite reassuring.  I am increasing her baclofen  dose to 20 mg 3 times daily.  Continue taking all of your other medications as prescribed. I'm also increasing your gabapentin  to 100mg  three times daily.

## 2024-10-09 NOTE — ED Notes (Signed)
 Called facility with no response to give report to nurse

## 2024-10-09 NOTE — ED Provider Notes (Signed)
 [place] EMERGENCY DEPARTMENT AT [facility] Provider Note   CSN: [id number] Arrival date & time: 10/09/24  1103     Patient presents with: Foot Pain and Chest Pain   Sarah Myers is a 53 y.o. female.  Patient with past history significant for right basal ganglia hemorrhage, AKI, bilateral pulmonary embolism, hypertension presents to the emergency department today with concerns of chest pain and foot pain.  Reports that she has been experiencing left-sided whole body pain for the last several hours.  States that she began to feel chest discomfort but no reported nausea, vomiting, or diaphoresis.  States that she also feels severe pain in the left lower leg.  Denies any recent falls, injuries.    Foot Pain Associated symptoms include chest pain.  Chest Pain      Prior to Admission medications   Medication Sig Start Date End Date Taking? Authorizing Provider  acetaminophen  (TYLENOL ) 500 MG tablet Take 1,000 mg by mouth every 6 (six) hours as needed (pain).    [provider]  amantadine  (SYMMETREL ) 100 MG capsule Take 100 mg by mouth 2 (two) times daily.    [provider]  amLODipine  (NORVASC ) 10 MG tablet Take 1 tablet (10 mg total) by mouth daily. 06/22/22   Raulkar, Sven SQUIBB, MD  apixaban  (ELIQUIS ) 5 MG TABS tablet Take 1 tablet (5 mg total) by mouth 2 (two) times daily. 06/22/22   Raulkar, Sven SQUIBB, MD  Baclofen  15 MG TABS Take 15 mg by mouth 3 (three) times daily.    [provider]  bisacodyl  (DULCOLAX) 10 MG suppository Place 1 suppository (10 mg total) rectally daily as needed for moderate constipation or mild constipation. 04/17/24   Hongalgi, Anand D, MD  busPIRone  (BUSPAR ) 7.5 MG tablet Take 7.5 mg by mouth 2 (two) times daily.    [provider]  cholecalciferol  (VITAMIN D3) 25 MCG (1000 UNIT) tablet Take 1,000 Units by mouth daily.    [provider]  famotidine  (PEPCID ) 20 MG tablet Take 1 tablet (20 mg total)  by mouth 2 (two) times daily. 05/22/22   Raulkar, Sven SQUIBB, MD  gabapentin  (NEURONTIN ) 100 MG capsule Take 1 capsule (100 mg total) by mouth 2 (two) times daily. 05/24/22   Raulkar, Sven SQUIBB, MD  hydrALAZINE  (APRESOLINE ) 25 MG tablet Take 25 mg by mouth every 6 (six) hours. 10/10/22   [provider]  levETIRAcetam  (KEPPRA ) 1000 MG tablet Take 1 tablet (1,000 mg total) by mouth 2 (two) times daily. 06/04/24   Kandis Perkins, DO  losartan  (COZAAR ) 25 MG tablet Take 1 tablet (25 mg total) by mouth daily. 06/09/22   Raulkar, Sven SQUIBB, MD  magnesium  gluconate (MAGONATE) 500 MG tablet Take 1 tablet (500 mg total) by mouth at bedtime. 05/22/22   Raulkar, Krutika P, MD  melatonin 3 MG TABS tablet Take 3 mg by mouth at bedtime as needed (sleep).    [provider]  mirtazapine  (REMERON ) 7.5 MG tablet Take 7.5 mg by mouth at bedtime.    [provider]  Multiple Vitamin (MULTIVITAMIN WITH MINERALS) TABS tablet Take 1 tablet by mouth daily. 05/22/22   Raulkar, Sven SQUIBB, MD  Nutritional Supplements (NUTRITIONAL DRINK) LIQD Take 240 mLs by mouth 2 (two) times daily. House supplement    [provider]  ondansetron  (ZOFRAN ) 4 MG tablet Take 1 tablet (4 mg total) by mouth every 8 (eight) hours as needed for nausea or vomiting. 10/17/22   Tegeler, Lonni PARAS, MD  polyethylene glycol (MIRALAX  / GLYCOLAX ) 17 g packet Take 17 g by mouth daily. 10/17/23   Drusilla Sabas RAMAN, MD  saccharomyces boulardii (FLORASTOR) 250 MG capsule Take 250 mg by mouth daily.    [provider]  senna-docusate (SENOKOT-S) 8.6-50 MG tablet Take 1 tablet by mouth 2 (two) times daily. 04/17/24   Hongalgi, Anand D, MD  tiZANidine  (ZANAFLEX ) 2 MG tablet Take 2 mg by mouth at bedtime.    [provider]    Allergies: Zestril  [lisinopril ]    Review of Systems  Cardiovascular:  Positive for chest pain.  Musculoskeletal:        Left body pain  All other systems reviewed and are  negative.   Updated Vital Signs BP (!) 162/132   Pulse 64   Temp 98.5 F (36.9 C) (Rectal)   Resp 20   Ht 5' 3 (1.6 m)   SpO2 100%   BMI 16.47 kg/m   Physical Exam Vitals reviewed.  Constitutional:      General: She is not in acute distress.    Appearance: She is well-developed.  HENT:     Head: Normocephalic and atraumatic.  Eyes:     Conjunctiva/sclera: Conjunctivae normal.  Cardiovascular:     Rate and Rhythm: Normal rate and regular rhythm.     Heart sounds: Heart sounds not distant. No murmur heard.    No systolic murmur is present.     No diastolic murmur is present.  Pulmonary:     Effort: Pulmonary effort is normal. No respiratory distress.     Breath sounds: Normal breath sounds. No decreased breath sounds, wheezing, rhonchi or rales.  Chest:     Chest wall: Tenderness present.     Comments: Left sided chest wall tenderness Abdominal:     Palpations: Abdomen is soft.     Tenderness: There is no abdominal tenderness.  Musculoskeletal:        General: No swelling.     Cervical back: Neck supple.     Right lower leg: No tenderness. No edema.     Left lower leg: Tenderness present. No edema.     Comments: Left lower leg pain to palpation from the left hip throguh the left foot. Palpable pulses.  Skin:    General: Skin is warm and dry.     Capillary Refill: Capillary refill takes less than 2 seconds.  Neurological:     Mental Status: She is alert.  Psychiatric:        Mood and Affect: Mood normal.     (all labs ordered are listed, but only abnormal results are displayed) Labs Reviewed  COMPREHENSIVE METABOLIC PANEL WITH GFR - Abnormal; Notable for the following components:      Result Value   Calcium 10.6 (*)    All other components within normal limits  CBC WITH DIFFERENTIAL/PLATELET  URINALYSIS, ROUTINE W REFLEX MICROSCOPIC  TROPONIN I (HIGH SENSITIVITY)  TROPONIN I (HIGH SENSITIVITY)    EKG: None  Radiology: CT Head Wo Contrast Result  Date: 10/09/2024 EXAM: CT HEAD WITHOUT CONTRAST 10/09/2024 03:02:23 PM TECHNIQUE: CT of the head was performed without the administration of intravenous contrast. Automated exposure control, iterative reconstruction, and/or weight based adjustment of the mA/kV was utilized to reduce the radiation dose to as low as reasonably achievable. COMPARISON: CT head without contrast 09/14/2024. CLINICAL HISTORY: Mental status change, unknown cause. FINDINGS: BRAIN AND VENTRICLES: No acute hemorrhage. No evidence of acute infarct. Paramedian infarct is again noted in the posterior left occipital  lobe. Infarct is again noted in the posterior right lentiform nucleus. Atrophy and white matter changes are mildly advanced for age, stable. No hydrocephalus. No extra-axial collection. No mass effect or midline shift. ORBITS: No acute abnormality. SINUSES: No acute abnormality. SOFT TISSUES AND SKULL: No acute soft tissue abnormality. No skull fracture. IMPRESSION: 1. No acute intracranial abnormality. 2. 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 3. Mildly advanced cerebral atrophy and white matter changes, unchanged. This most likely reflects the sequelae of chronic microvascular ischemia. Electronically signed by: Lonni Necessary MD 10/09/2024 03:12 PM EDT RP Workstation: [id number]   DG Pelvis Portable Result Date: 10/09/2024 CLINICAL DATA:  Chest pain and left foot throbbing. No reported injury. Bowel movement on the bed in the ED. EXAM: PORTABLE PELVIS 1-2 VIEWS COMPARISON:  04/15/2024 FINDINGS: The examination is limited by patient rotation to the right due to patient refusal to cooperate for positioning. Prominent stool throughout the visualized colon, including the rectum. No pelvic bony abnormalities seen. IMPRESSION: Prominent stool throughout the visualized colon, including the rectum. Electronically Signed   By: Elspeth Bathe M.D.   On: 10/09/2024 12:54    DG Chest Portable 1 View Result Date: 10/09/2024 CLINICAL DATA:  Chest pain. EXAM: PORTABLE CHEST 1 VIEW COMPARISON:  09/14/2024 FINDINGS: The examination is limited by the patient's left arm overlying the upper abdomen and lower chest. The patient refused to move the arm for the examination. Stable enlarged cardiac silhouette. Grossly clear lungs with normal vascularity. Unremarkable bones. IMPRESSION: 1. Limited examination due to patient uncooperation with no acute abnormality seen. 2. Stable cardiomegaly. Electronically Signed   By: Elspeth Bathe M.D.   On: 10/09/2024 12:51     Procedures   Medications Ordered in the ED  ondansetron  (ZOFRAN ) injection 4 mg (4 mg Intravenous Given 10/09/24 1512)  fentaNYL  (SUBLIMAZE ) injection 25 mcg (25 mcg Intravenous Given 10/09/24 1512)                                    Medical Decision Making Amount and/or Complexity of Data Reviewed Labs: ordered. Radiology: ordered.  Risk Prescription drug management.   This patient presents to the ED for concern of chest pain, foot pain, this involves an extensive number of treatment options, and is a complaint that carries with it a high risk of complications and morbidity.  The differential diagnosis includes ACS, fall, hip fracture, rib fracture, contusion   Co morbidities that complicate the patient evaluation  Hypertension, right basal ganglia hemorrhage, history of PE, seizure disorder   Additional history obtained:  Additional history obtained from epic chart review   Lab Tests:  I Ordered, and personally interpreted labs.  The pertinent results include: CBC unremarkable, CMP mild hypercalcemia 10.6, troponin normal at 3 x 2, UA pending   Imaging Studies ordered:  I ordered imaging studies including chest x-ray, pelvis x-ray I independently visualized and interpreted imaging which showed chest x-ray unremarkable for any acute cardiopulmonary process, pelvis x-ray unremarkable with  exception of some large stool burden seen, CT head shows no acute findings and chronic findings of occipital and lentiform strokes I agree with the radiologist interpretation   Cardiac Monitoring: / EKG:  The patient was maintained on a cardiac monitor.  I personally viewed and interpreted the cardiac monitored which showed an underlying rhythm of: Sinus rhythm   Consultations Obtained:  I requested consultation with none,  and discussed lab and imaging findings as well as pertinent plan - they recommend: N/A   Problem List / ED Course / Critical interventions / Medication management  Patient presents to the emergency department today with concerns of left-sided body pain.  Reports ongoing for last day or so and received nitroglycerin  earlier without relief.  Denies any head impact or head injury.  She states that the left pain changes from locations. On exam, patient has no acute deficits.  Residual deficits seen from prior strokes.  No acute or focal changes.  No appreciable abdominal tenderness.  No normal heart or lung sounds.  She is otherwise well-appearing at her baseline from my assessment. Lab workup unremarkable.  CBC, CMP, troponin negative x 2.  UA pending.  Chest x-ray, pelvic x-ray unremarkable.  CT head added on after patient reports increasing feelings of confusion but she would not elaborate further.  She appears to be somewhat confused as to how she got to the hospital. I ordered medication including fentanyl , Zofran  for pain, nausea Reevaluation of the patient after these medicines showed that the patient improved I have reviewed the patients home medicines and have made adjustments as needed   3:27 PM Care of Sarah Myers transferred to [facility] and Dr. Mannie at the end of my shift as the patient will require reassessment once labs/imaging have resulted. Patient presentation, ED course, and plan of care discussed with review of all pertinent labs and imaging.  Please see his/her note for further details regarding further ED course and disposition. Plan at time of handoff is final disposition per reassessment and full lab workup.  Consider possible admission and patient is potentially coming encephalopathic. This may be altered or completely changed at the discretion of the oncoming team pending results of further workup.  Social Determinants of Health:  None   Test / Admission - Considered:  Final disposition per oncoming team.  Final diagnoses:  None    ED Discharge Orders     None          Cecily Legrand LABOR, PA-C 10/09/24 1527    Lowther, Amy, DO 10/10/24 1625

## 2024-10-09 NOTE — ED Notes (Signed)
 Patient urinated and had a bowel movement before was was able to get a bed pan under her. Patient was cleaned up, rectal temp taken and placed in a new brief

## 2024-10-09 NOTE — ED Provider Notes (Cosign Needed Addendum)
 Accepted handoff at shift change from [place] PA-C. Please see prior provider note for more detail.   Briefly: Patient is 53 y.o.   Patient with past medical history significant for right basal ganglia hemorrhage, AKI, bilateral PEs, hypertension  Patient presents emergency room today with complaint of chest pain and foot pain also complains of full left-sided body pain for several hours.  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  No nausea vomiting or diaphoresis.  No recent injuries or trauma.       Physical Exam  BP (!) 162/132   Pulse 64   Temp 98.5 F (36.9 C) (Oral)   Resp 20   Ht 5' 3 (1.6 m)   SpO2 100%   BMI 16.47 kg/m   Physical Exam  Procedures  Procedures  ED Course / MDM    Medical Decision Making Amount and/or Complexity of Data Reviewed Labs: ordered. Radiology: ordered.  Risk Prescription drug management.   Patient with reassuring workup here I doubt the patient's left hemibody pain is new it seems that this been ongoing for quite some time seems that patient is on baclofen  15 mg 3 times daily will increase this to 20 mg 3 times daily.  Given 1 dose of benzodiazepine here in the emergency department and recommend outpatient follow-up.  Will also increase gabapentin  to 100 mg 3 times daily from 100 mg twice daily.  CT head, pelvis and chest x-ray is unremarkable.  I discussed this case with my attending physician who cosigned this note including patient's presenting symptoms, physical exam, and planned diagnostics and interventions. Attending physician stated agreement with plan or made changes to plan which were implemented.   Attending physician assessed patient at bedside.  I communicated this plan to patient and family member at bedside.  They are agreeable to plan.  Will discharge home.      Neldon Hamp RAMAN, [place] 10/09/24 2043    Neldon Hamp RAMAN, [place] 10/09/24 2045    Mannie Pac T, DO 10/10/24 2222

## 2024-12-02 ENCOUNTER — Emergency Department (HOSPITAL_COMMUNITY)

## 2024-12-02 ENCOUNTER — Emergency Department (HOSPITAL_COMMUNITY)
Admission: EM | Admit: 2024-12-02 | Discharge: 2024-12-03 | Disposition: A | Discharge status: Skilled Nursing Facility | Attending: Emergency Medicine | Admitting: Emergency Medicine

## 2024-12-02 DIAGNOSIS — J069 Acute upper respiratory infection, unspecified: Secondary | ICD-10-CM | POA: Diagnosis not present

## 2024-12-02 DIAGNOSIS — Z8673 Personal history of transient ischemic attack (TIA), and cerebral infarction without residual deficits: Secondary | ICD-10-CM | POA: Diagnosis not present

## 2024-12-02 DIAGNOSIS — Z7901 Long term (current) use of anticoagulants: Secondary | ICD-10-CM | POA: Diagnosis not present

## 2024-12-02 DIAGNOSIS — R059 Cough, unspecified: Secondary | ICD-10-CM | POA: Diagnosis present

## 2024-12-02 DIAGNOSIS — R000 Tachycardia, unspecified: Secondary | ICD-10-CM | POA: Diagnosis not present

## 2024-12-02 LAB — RESP PANEL BY RT-PCR (RSV, FLU A&B, COVID)  RVPGX2
Influenza A by PCR: NEGATIVE
Influenza B by PCR: NEGATIVE
Resp Syncytial Virus by PCR: NEGATIVE
SARS Coronavirus 2 by RT PCR: NEGATIVE

## 2024-12-02 LAB — CBC
HCT: 45.9 % (ref 36.0–46.0)
Hemoglobin: 15.1 g/dL — ABNORMAL HIGH (ref 12.0–15.0)
MCH: 28.3 pg (ref 26.0–34.0)
MCHC: 32.9 g/dL (ref 30.0–36.0)
MCV: 86.1 fL (ref 80.0–100.0)
Platelets: 241 K/uL (ref 150–400)
RBC: 5.33 MIL/uL — ABNORMAL HIGH (ref 3.87–5.11)
RDW: 11.9 % (ref 11.5–15.5)
WBC: 7.8 K/uL (ref 4.0–10.5)
nRBC: 0 % (ref 0.0–0.2)

## 2024-12-02 MED ORDER — ONDANSETRON 4 MG PO TBDP
4.0000 mg | ORAL_TABLET | Freq: Once | ORAL | Status: AC
  Administered 2024-12-03: 07:00:00 4 mg via ORAL
  Filled 2024-12-02: qty 1

## 2024-12-02 NOTE — ED Provider Triage Note (Signed)
 Emergency Medicine Provider Triage Evaluation Note  Sarah Myers , a 53 y.o. female  was evaluated in triage.  Pt complains of cough and chest pain.  The patient states that she has had a cough for the past week.  She has had associated nausea, denies any shortness of breath.  She endorses pleuritic chest pain associated with coughing.  She endorses fatigue.  Review of Systems  Positive: Cough, chest pain, fatigue Negative: Shortness of breath  Physical Exam  BP (!) 158/112   Pulse 80   Temp 98.2 F (36.8 C)   Resp 16   SpO2 98%  Gen:   Awake, no distress   Resp:  Normal effort, lungs CTAB MSK:   Moves extremities without difficulty  Medical Decision Making  Medically screening exam initiated at 8:32 PM.  Appropriate orders placed.  Roselyn ONEIDA Phlegm was informed that the remainder of the evaluation will be completed by another provider, this initial triage assessment does not replace that evaluation, and the importance of remaining in the ED until their evaluation is complete.  Workup initiated to evaluate for infectious etiology, chest pain workup ordered.   Jerrol Agent, MD 12/02/24 2102

## 2024-12-02 NOTE — ED Triage Notes (Signed)
 BIB EMS from [facility], cough  x 1 week, chest pain with coughing. Nausea but vomiting and no SOB  174/100 100% ra 70 hr Cbg 121

## 2024-12-03 ENCOUNTER — Encounter (HOSPITAL_COMMUNITY): Payer: Self-pay | Admitting: Emergency Medicine

## 2024-12-03 DIAGNOSIS — J069 Acute upper respiratory infection, unspecified: Secondary | ICD-10-CM | POA: Diagnosis not present

## 2024-12-03 LAB — TROPONIN T, HIGH SENSITIVITY
Troponin T High Sensitivity: 31 ng/L — ABNORMAL HIGH (ref 0–19)
Troponin T High Sensitivity: 27 ng/L — ABNORMAL HIGH (ref 0–19)

## 2024-12-03 LAB — COMPREHENSIVE METABOLIC PANEL WITH GFR
ALT: 18 U/L (ref 0–44)
AST: 26 U/L (ref 15–41)
Albumin: 4.7 g/dL (ref 3.5–5.0)
Alkaline Phosphatase: 161 U/L — ABNORMAL HIGH (ref 38–126)
Anion gap: 14 (ref 5–15)
BUN: 13 mg/dL (ref 6–20)
CO2: 22 mmol/L (ref 22–32)
Calcium: 12 mg/dL — ABNORMAL HIGH (ref 8.9–10.3)
Chloride: 104 mmol/L (ref 98–111)
Creatinine, Ser: 0.72 mg/dL (ref 0.44–1.00)
GFR, Estimated: >60 mL/min (ref >60)
Glucose, Bld: 103 mg/dL — ABNORMAL HIGH (ref 70–99)
Potassium: 4.4 mmol/L (ref 3.5–5.1)
Sodium: 140 mmol/L (ref 135–145)
Total Bilirubin: 0.4 mg/dL (ref 0.0–1.2)
Total Protein: 9.2 g/dL — ABNORMAL HIGH (ref 6.5–8.1)

## 2024-12-03 LAB — CBG MONITORING, ED: Glucose-Capillary: 95 mg/dL (ref 70–99)

## 2024-12-03 MED ORDER — ACETAMINOPHEN 500 MG PO TABS
1000.0000 mg | ORAL_TABLET | Freq: Once | ORAL | Status: DC
  Filled 2024-12-03: qty 2

## 2024-12-03 MED ORDER — TIZANIDINE HCL 4 MG PO TABS
2.0000 mg | ORAL_TABLET | Freq: Every day | ORAL | Status: DC
  Administered 2024-12-03: 07:00:00 2 mg via ORAL
  Filled 2024-12-03: qty 1

## 2024-12-03 MED ORDER — ACETAMINOPHEN 500 MG PO TABS: 1000.0000 mg | ORAL_TABLET | Freq: Four times a day (QID) | ORAL | Status: DC | PRN

## 2024-12-03 MED ORDER — BACLOFEN 10 MG PO TABS
20.0000 mg | ORAL_TABLET | Freq: Once | ORAL | Status: AC
  Administered 2024-12-03: 14:00:00 20 mg via ORAL
  Filled 2024-12-03: qty 2

## 2024-12-03 MED ORDER — SODIUM CHLORIDE 0.9 % IV BOLUS
1000.0000 mL | Freq: Once | INTRAVENOUS | Status: AC
  Administered 2024-12-03: 13:00:00 1000 mL via INTRAVENOUS

## 2024-12-03 MED ORDER — GABAPENTIN 100 MG PO CAPS
100.0000 mg | ORAL_CAPSULE | Freq: Two times a day (BID) | ORAL | Status: DC
  Administered 2024-12-03: 07:00:00 100 mg via ORAL
  Filled 2024-12-03: qty 1

## 2024-12-03 MED ORDER — BACLOFEN 10 MG PO TABS: 20.0000 mg | ORAL_TABLET | Freq: Three times a day (TID) | ORAL | Status: DC

## 2024-12-03 NOTE — ED Notes (Signed)
 Per Triage RN, draw second trop when patient is awake. KIT

## 2024-12-03 NOTE — ED Notes (Signed)
 [facility] called, eta just a little bit

## 2024-12-03 NOTE — ED Provider Notes (Signed)
 [place] EMERGENCY DEPARTMENT AT [facility] Provider Note   CSN: [id number] Arrival date & time: 12/02/24  1947     Patient presents with: Cough   Sarah Myers is a 53 y.o. female.   53 yo F with a chief complaints of what sounds to COVID viral syndrome cough congestion fatigue.  Hard to obtain a history of patient very soft-spoken and has trouble communicating.  Level 5 caveat.   Cough      Prior to Admission medications  Medication Sig Start Date End Date Taking? Authorizing Provider  acetaminophen  (TYLENOL ) 500 MG tablet Take 1,000 mg by mouth every 6 (six) hours as needed (pain).    [provider]  amantadine  (SYMMETREL ) 100 MG capsule Take 100 mg by mouth 2 (two) times daily.    [provider]  amLODipine  (NORVASC ) 10 MG tablet Take 1 tablet (10 mg total) by mouth daily. 06/22/22   Raulkar, Sven SQUIBB, MD  apixaban  (ELIQUIS ) 5 MG TABS tablet Take 1 tablet (5 mg total) by mouth 2 (two) times daily. 06/22/22   Raulkar, Sven SQUIBB, MD  baclofen  (LIORESAL ) 20 MG tablet Take 1 tablet (20 mg total) by mouth 3 (three) times daily. 10/09/24   Neldon Hamp RAMAN, PA  bisacodyl  (DULCOLAX) 10 MG suppository Place 1 suppository (10 mg total) rectally daily as needed for moderate constipation or mild constipation. 04/17/24   Hongalgi, Trenda BIRCH, MD  busPIRone  (BUSPAR ) 7.5 MG tablet Take 7.5 mg by mouth 2 (two) times daily.    [provider]  cholecalciferol  (VITAMIN D3) 25 MCG (1000 UNIT) tablet Take 1,000 Units by mouth daily.    [provider]  famotidine  (PEPCID ) 20 MG tablet Take 1 tablet (20 mg total) by mouth 2 (two) times daily. 05/22/22   Raulkar, Sven SQUIBB, MD  gabapentin  (NEURONTIN ) 100 MG capsule Take 1 capsule (100 mg total) by mouth 3 (three) times daily. 10/09/24 11/08/24  Neldon Hamp RAMAN, PA  hydrALAZINE  (APRESOLINE ) 25 MG tablet Take 25 mg by mouth every 6 (six) hours. 10/10/22   [provider]  levETIRAcetam   (KEPPRA ) 1000 MG tablet Take 1 tablet (1,000 mg total) by mouth 2 (two) times daily. 06/04/24   Kandis Perkins, DO  losartan  (COZAAR ) 25 MG tablet Take 1 tablet (25 mg total) by mouth daily. 06/09/22   Raulkar, Sven SQUIBB, MD  magnesium  gluconate (MAGONATE) 500 MG tablet Take 1 tablet (500 mg total) by mouth at bedtime. 05/22/22   Raulkar, Krutika P, MD  melatonin 3 MG TABS tablet Take 3 mg by mouth at bedtime as needed (sleep).    [provider]  mirtazapine  (REMERON ) 7.5 MG tablet Take 7.5 mg by mouth at bedtime.    [provider]  Multiple Vitamin (MULTIVITAMIN WITH MINERALS) TABS tablet Take 1 tablet by mouth daily. 05/22/22   Raulkar, Sven SQUIBB, MD  Nutritional Supplements (NUTRITIONAL DRINK) LIQD Take 240 mLs by mouth 2 (two) times daily. House supplement    [provider]  ondansetron  (ZOFRAN ) 4 MG tablet Take 1 tablet (4 mg total) by mouth every 8 (eight) hours as needed for nausea or vomiting. 10/17/22   Tegeler, Lonni PARAS, MD  polyethylene glycol (MIRALAX  / GLYCOLAX ) 17 g packet Take 17 g by mouth daily. 10/17/23   Drusilla Sabas RAMAN, MD  saccharomyces boulardii (FLORASTOR) 250 MG capsule Take 250 mg by mouth daily.    [provider]  senna-docusate (SENOKOT-S) 8.6-50 MG tablet Take 1 tablet by mouth 2 (two) times  daily. 04/17/24   Hongalgi, Anand D, MD  tiZANidine  (ZANAFLEX ) 2 MG tablet Take 2 mg by mouth at bedtime.    [provider]    Allergies: Zestril  [lisinopril ]    Review of Systems  Respiratory:  Positive for cough.     Updated Vital Signs BP (!) 159/118   Pulse (!) 110   Temp 98 F (36.7 C) (Axillary)   Resp (!) 21   SpO2 100%   Physical Exam Vitals and nursing note reviewed.  Constitutional:      General: She is not in acute distress.    Appearance: She is well-developed. She is not diaphoretic.  HENT:     Head: Normocephalic and atraumatic.  Eyes:     Pupils: Pupils are equal, round, and reactive to light.   Cardiovascular:     Rate and Rhythm: Regular rhythm. Tachycardia present.     Heart sounds: No murmur heard.    No friction rub. No gallop.  Pulmonary:     Effort: Pulmonary effort is normal.     Breath sounds: No wheezing or rales.  Abdominal:     General: There is no distension.     Palpations: Abdomen is soft.     Tenderness: There is no abdominal tenderness.  Musculoskeletal:        General: No tenderness.     Cervical back: Normal range of motion and neck supple.     Comments: Contractured extremities  Skin:    General: Skin is warm and dry.  Neurological:     Mental Status: She is alert and oriented to person, place, and time.  Psychiatric:        Behavior: Behavior normal.     (all labs ordered are listed, but only abnormal results are displayed) Labs Reviewed  CBC - Abnormal; Notable for the following components:      Result Value   RBC 5.33 (*)    Hemoglobin 15.1 (*)    All other components within normal limits  COMPREHENSIVE METABOLIC PANEL WITH GFR - Abnormal; Notable for the following components:   Glucose, Bld 103 (*)    Calcium 12.0 (*)    Total Protein 9.2 (*)    Alkaline Phosphatase 161 (*)    All other components within normal limits  TROPONIN T, HIGH SENSITIVITY - Abnormal; Notable for the following components:   Troponin T High Sensitivity 31 (*)    All other components within normal limits  TROPONIN T, HIGH SENSITIVITY - Abnormal; Notable for the following components:   Troponin T High Sensitivity 27 (*)    All other components within normal limits  RESP PANEL BY RT-PCR (RSV, FLU A&B, COVID)  RVPGX2  CBG MONITORING, ED    EKG: EKG Interpretation Date/Time:  Tuesday December 02 2024 23:59:44 EST Ventricular Rate:  90 PR Interval:  162 QRS Duration:  108 QT Interval:  366 QTC Calculation: 447 R Axis:   121  Text Interpretation: Normal sinus rhythm Right atrial enlargement Right axis deviation Cannot rule out Anterior infarct , age  undetermined T wave abnormality, consider lateral ischemia Abnormal ECG When compared with ECG of 09-Oct-2024 11:21, PREVIOUS ECG IS PRESENT Confirmed by Lenor Hollering [phone]) on 12/03/2024 10:51:40 AM  Radiology: [facility] Chest 1 View Result Date: 12/02/2024 EXAM: 1 VIEW(S) XRAY OF THE CHEST 12/02/2024 10:02:36 PM COMPARISON: 12/02/2024 CLINICAL HISTORY: Cough FINDINGS: LUNGS AND PLEURA: No focal pulmonary opacity. No pleural effusion. No pneumothorax. HEART AND MEDIASTINUM: No acute abnormality of the cardiac and mediastinal silhouettes.  BONES AND SOFT TISSUES: No acute osseous abnormality. IMPRESSION: 1. No acute process. Electronically signed by: Dorethia Molt MD 12/02/2024 10:23 PM EST RP Workstation: [id number]   DG Chest 2 View Result Date: 12/02/2024 EXAM: 2 VIEW(S) XRAY OF THE CHEST 12/02/2024 08:45:00 PM COMPARISON: 10/09/2024 CLINICAL HISTORY: cough FINDINGS: LIMITATIONS/ARTIFACTS: Limited evaluation due to arm overlying the right lower lung zone. LUNGS AND PLEURA: No focal pulmonary opacity. No pleural effusion. No pneumothorax. HEART AND MEDIASTINUM: No acute abnormality of the cardiac and mediastinal silhouettes. BONES AND SOFT TISSUES: No acute osseous abnormality. IMPRESSION: 1. Limited evaluation due to arm overlying the right lower lung zone. Recommend repeating frontal view. Electronically signed by: Morgane Naveau MD 12/02/2024 08:51 PM EST RP Workstation: [id number]     Procedures   Medications Ordered in the ED  acetaminophen  (TYLENOL ) tablet 1,000 mg (has no administration in time range)  baclofen  (LIORESAL ) tablet 20 mg (20 mg Oral Not Given 12/03/24 1352)  tiZANidine  (ZANAFLEX ) tablet 2 mg (2 mg Oral Given 12/03/24 0630)  gabapentin  (NEURONTIN ) capsule 100 mg (100 mg Oral Not Given 12/03/24 1404)  acetaminophen  (TYLENOL ) tablet 1,000 mg (has no administration in time range)  ondansetron  (ZOFRAN -ODT) disintegrating tablet 4 mg (4 mg Oral Given 12/03/24 0630)  baclofen   (LIORESAL ) tablet 20 mg (20 mg Oral Given 12/03/24 1354)  sodium chloride  0.9 % bolus 1,000 mL (0 mLs Intravenous Stopped 12/03/24 1416)                                    Medical Decision Making Amount and/or Complexity of Data Reviewed Radiology: ordered.  Risk OTC drugs. Prescription drug management.   53 yo F with a significant past medical history of a stroke leaving her significantly disabled send in a nursing facility.  Here for cough over the past week.  Chest x-ray on my independent interpretation without obvious focal infiltrate or pneumothorax.  No significant electrolyte abnormalities no acute anemia.  I am not sure why a troponin was ordered for her.  It is mildly elevated at 30.  Will repeat here.  She is tachycardic and has been more more tachycardic when her vitals up and checked throughout her stay.  I wonder if she is in baclofen  withdrawal.  Will give dose of baclofen  here.  IV fluids if able to start IV.  Reassess.  Patient given a liter of IV fluids with some improvement of her heart rate.  Tachycardia is not quite resolved but has improved.  She did get a dose of baclofen  and now feels like her discomfort is totally gone.  I discussed the results with her and she would like to go home at this time.  2:29 PM:  I have discussed the diagnosis/risks/treatment options with the patient.  Evaluation and diagnostic testing in the emergency department does not suggest an emergent condition requiring admission or immediate intervention beyond what has been performed at this time.  They will follow up with PCP. We also discussed returning to the ED immediately if new or worsening sx occur. We discussed the sx which are most concerning (e.g., sudden worsening pain, fever, inability to tolerate by mouth) that necessitate immediate return. Medications administered to the patient during their visit and any new prescriptions provided to the patient are listed below.  Medications given  during this visit Medications  acetaminophen  (TYLENOL ) tablet 1,000 mg (has no administration in time range)  baclofen  (LIORESAL ) tablet 20 mg (20  mg Oral Not Given 12/03/24 1352)  tiZANidine  (ZANAFLEX ) tablet 2 mg (2 mg Oral Given 12/03/24 0630)  gabapentin  (NEURONTIN ) capsule 100 mg (100 mg Oral Not Given 12/03/24 1404)  acetaminophen  (TYLENOL ) tablet 1,000 mg (has no administration in time range)  ondansetron  (ZOFRAN -ODT) disintegrating tablet 4 mg (4 mg Oral Given 12/03/24 0630)  baclofen  (LIORESAL ) tablet 20 mg (20 mg Oral Given 12/03/24 1354)  sodium chloride  0.9 % bolus 1,000 mL (0 mLs Intravenous Stopped 12/03/24 1416)     The patient appears reasonably screen and/or stabilized for discharge and I doubt any other medical condition or other [facility] requiring further screening, evaluation, or treatment in the ED at this time prior to discharge.       Final diagnoses:  Viral URI with cough    ED Discharge Orders     None          Emil Share, DO 12/03/24 1429

## 2024-12-03 NOTE — ED Provider Notes (Signed)
 1:35 AM Called to triage to reassess patient due to concern for AMS. Arrived via EMS from [facility].   Patient appears agitated, but not acutely encephalopathic. She is complaining of pain to her left shoulder and L side; states it is pain associated with her stroke. Prior notes do document prior L sided hemiparesis; likely some associated spasticity contributing to pain. Nightly medications ordered for this.    Keith Sor, PA-C 12/03/24 0141    Theadore Ozell HERO, MD 12/03/24 [phone]

## 2024-12-03 NOTE — ED Notes (Signed)
 Collected troponin earlier. Lab called to let me know it's hemolyzed. Poked patient twice for recollect. Unable to collect labs. Triage RN aware. KIT

## 2024-12-03 NOTE — ED Notes (Signed)
 2nd troponin redraw sent down to lab.

## 2024-12-03 NOTE — ED Notes (Signed)
 Daughter Tambrey Donate [phone] would like an update asap

## 2024-12-03 NOTE — Discharge Instructions (Signed)
 Please follow-up with your family doctor in the office.  Please return for worsening symptoms or anytime you would like to be reevaluated.

## 2024-12-03 NOTE — ED Notes (Signed)
 Pt placed on stretcher for comfort in triage 6

## 2024-12-03 NOTE — ED Notes (Signed)
 Unable to collect 2nd trop. Triage RN aware

## 2024-12-03 NOTE — ED Notes (Signed)
 Report called to RN at [facility].

## 2024-12-03 NOTE — ED Notes (Signed)
 Dtr called and was updated.

## 2024-12-03 NOTE — ED Notes (Signed)
 Pt increasingly agitated and trying to get out of the bed. RN made aware

## 2025-02-19 ENCOUNTER — Ambulatory Visit: Admitting: Neurology
